# Patient Record
Sex: Female | Born: 1950
Health system: Southern US, Community
[De-identification: ages and names within clinical notes are randomized; demographics above are authoritative.]

## PROBLEM LIST (undated history)

## (undated) DIAGNOSIS — J189 Pneumonia, unspecified organism: Secondary | ICD-10-CM

## (undated) DIAGNOSIS — N186 End stage renal disease: Secondary | ICD-10-CM

## (undated) DIAGNOSIS — E785 Hyperlipidemia, unspecified: Secondary | ICD-10-CM

## (undated) DIAGNOSIS — Z992 Dependence on renal dialysis: Secondary | ICD-10-CM

## (undated) DIAGNOSIS — I998 Other disorder of circulatory system: Secondary | ICD-10-CM

## (undated) DIAGNOSIS — I2510 Atherosclerotic heart disease of native coronary artery without angina pectoris: Secondary | ICD-10-CM

## (undated) DIAGNOSIS — I5022 Chronic systolic (congestive) heart failure: Secondary | ICD-10-CM

## (undated) DIAGNOSIS — L97509 Non-pressure chronic ulcer of other part of unspecified foot with unspecified severity: Secondary | ICD-10-CM

## (undated) DIAGNOSIS — I351 Nonrheumatic aortic (valve) insufficiency: Secondary | ICD-10-CM

## (undated) DIAGNOSIS — I739 Peripheral vascular disease, unspecified: Secondary | ICD-10-CM

## (undated) DIAGNOSIS — D509 Iron deficiency anemia, unspecified: Secondary | ICD-10-CM

## (undated) DIAGNOSIS — I340 Nonrheumatic mitral (valve) insufficiency: Secondary | ICD-10-CM

## (undated) DIAGNOSIS — J309 Allergic rhinitis, unspecified: Secondary | ICD-10-CM

## (undated) DIAGNOSIS — I10 Essential (primary) hypertension: Secondary | ICD-10-CM

## (undated) DIAGNOSIS — N2581 Secondary hyperparathyroidism of renal origin: Secondary | ICD-10-CM

## (undated) DIAGNOSIS — E1165 Type 2 diabetes mellitus with hyperglycemia: Secondary | ICD-10-CM

## (undated) DIAGNOSIS — K209 Esophagitis, unspecified without bleeding: Secondary | ICD-10-CM

## (undated) DIAGNOSIS — M5412 Radiculopathy, cervical region: Secondary | ICD-10-CM

## (undated) DIAGNOSIS — I70229 Atherosclerosis of native arteries of extremities with rest pain, unspecified extremity: Secondary | ICD-10-CM

## (undated) DIAGNOSIS — I255 Ischemic cardiomyopathy: Secondary | ICD-10-CM

## (undated) HISTORY — PX: EYE SURGERY: SHX253

## (undated) HISTORY — DX: Hyperlipidemia, unspecified: E78.5

## (undated) HISTORY — DX: Radiculopathy, cervical region: M54.12

## (undated) HISTORY — PX: COLONOSCOPY: SHX174

## (undated) HISTORY — DX: Iron deficiency anemia, unspecified: D50.9

## (undated) HISTORY — PX: CATARACT EXTRACTION: SUR2

## (undated) HISTORY — PX: AV FISTULA PLACEMENT: SHX1204

## (undated) HISTORY — DX: Nonrheumatic aortic (valve) insufficiency: I35.1

## (undated) HISTORY — DX: Essential (primary) hypertension: I10

## (undated) HISTORY — DX: Secondary hyperparathyroidism of renal origin: N25.81

## (undated) HISTORY — DX: Type 2 diabetes mellitus with hyperglycemia: E11.65

## (undated) HISTORY — DX: Other disorder of circulatory system: I99.8

## (undated) HISTORY — DX: Non-pressure chronic ulcer of other part of unspecified foot with unspecified severity: L97.509

## (undated) HISTORY — DX: Allergic rhinitis, unspecified: J30.9

## (undated) HISTORY — DX: Ischemic cardiomyopathy: I25.5

## (undated) HISTORY — DX: Atherosclerosis of native arteries of extremities with rest pain, unspecified extremity: I70.229

---

## 1998-02-18 ENCOUNTER — Other Ambulatory Visit: Admission: RE | Admit: 1998-02-18 | Discharge: 1998-02-18 | Payer: Self-pay | Admitting: Family Medicine

## 1998-06-26 ENCOUNTER — Other Ambulatory Visit: Admission: RE | Admit: 1998-06-26 | Discharge: 1998-06-26 | Payer: Self-pay | Admitting: Obstetrics

## 1998-07-02 ENCOUNTER — Ambulatory Visit (HOSPITAL_COMMUNITY): Admission: RE | Admit: 1998-07-02 | Discharge: 1998-07-02 | Payer: Self-pay | Admitting: Obstetrics

## 1998-07-22 ENCOUNTER — Ambulatory Visit (HOSPITAL_COMMUNITY): Admission: RE | Admit: 1998-07-22 | Discharge: 1998-07-22 | Payer: Self-pay | Admitting: General Surgery

## 2000-05-25 ENCOUNTER — Encounter: Admission: RE | Admit: 2000-05-25 | Discharge: 2000-08-23 | Payer: Self-pay | Admitting: *Deleted

## 2001-10-13 ENCOUNTER — Ambulatory Visit (HOSPITAL_COMMUNITY): Admission: RE | Admit: 2001-10-13 | Discharge: 2001-10-13 | Payer: Self-pay | Admitting: Family Medicine

## 2004-12-04 ENCOUNTER — Ambulatory Visit: Payer: Self-pay | Admitting: Internal Medicine

## 2005-01-15 ENCOUNTER — Ambulatory Visit: Payer: Self-pay | Admitting: Internal Medicine

## 2005-10-14 ENCOUNTER — Ambulatory Visit: Payer: Self-pay | Admitting: Internal Medicine

## 2005-10-23 ENCOUNTER — Ambulatory Visit: Payer: Self-pay | Admitting: Internal Medicine

## 2005-12-01 ENCOUNTER — Ambulatory Visit: Payer: Self-pay | Admitting: Internal Medicine

## 2005-12-21 ENCOUNTER — Ambulatory Visit: Payer: Self-pay | Admitting: Internal Medicine

## 2005-12-23 ENCOUNTER — Encounter: Admission: RE | Admit: 2005-12-23 | Discharge: 2005-12-23 | Payer: Self-pay | Admitting: Obstetrics and Gynecology

## 2005-12-30 ENCOUNTER — Encounter: Payer: Self-pay | Admitting: Obstetrics

## 2005-12-30 ENCOUNTER — Encounter (HOSPITAL_BASED_OUTPATIENT_CLINIC_OR_DEPARTMENT_OTHER): Payer: Self-pay | Admitting: General Surgery

## 2006-01-04 ENCOUNTER — Encounter: Admission: RE | Admit: 2006-01-04 | Discharge: 2006-01-04 | Payer: Self-pay | Admitting: Obstetrics and Gynecology

## 2006-02-01 ENCOUNTER — Ambulatory Visit: Payer: Self-pay | Admitting: Internal Medicine

## 2006-02-10 ENCOUNTER — Emergency Department (HOSPITAL_COMMUNITY): Admission: EM | Admit: 2006-02-10 | Discharge: 2006-02-10 | Payer: Self-pay | Admitting: Emergency Medicine

## 2006-02-16 ENCOUNTER — Ambulatory Visit: Payer: Self-pay | Admitting: Internal Medicine

## 2006-03-15 ENCOUNTER — Ambulatory Visit: Payer: Self-pay | Admitting: Gastroenterology

## 2006-04-15 ENCOUNTER — Ambulatory Visit: Payer: Self-pay | Admitting: Gastroenterology

## 2006-04-15 LAB — HM COLONOSCOPY: HM Colonoscopy: NORMAL

## 2007-01-07 ENCOUNTER — Ambulatory Visit: Payer: Self-pay | Admitting: Internal Medicine

## 2007-01-07 LAB — CONVERTED CEMR LAB
ALT: 12 units/L (ref 0–40)
AST: 20 units/L (ref 0–37)
Albumin: 3.1 g/dL — ABNORMAL LOW (ref 3.5–5.2)
Alkaline Phosphatase: 57 units/L (ref 39–117)
BUN: 31 mg/dL — ABNORMAL HIGH (ref 6–23)
Basophils Absolute: 0.1 10*3/uL (ref 0.0–0.1)
Basophils Relative: 1 % (ref 0.0–1.0)
Bilirubin Urine: NEGATIVE
Bilirubin, Direct: 0.1 mg/dL (ref 0.0–0.3)
CO2: 25 meq/L (ref 19–32)
Calcium: 9.1 mg/dL (ref 8.4–10.5)
Chloride: 112 meq/L (ref 96–112)
Cholesterol: 175 mg/dL (ref 0–200)
Creatinine, Ser: 1.4 mg/dL — ABNORMAL HIGH (ref 0.4–1.2)
Creatinine,U: 76.9 mg/dL
Crystals: NEGATIVE
Eosinophils Absolute: 0.1 10*3/uL (ref 0.0–0.6)
Eosinophils Relative: 1.1 % (ref 0.0–5.0)
GFR calc Af Amer: 50 mL/min
GFR calc non Af Amer: 42 mL/min
Glucose, Bld: 86 mg/dL (ref 70–99)
HCT: 24.6 % — ABNORMAL LOW (ref 36.0–46.0)
HDL: 63.3 mg/dL (ref >39.0)
Hemoglobin: 8.6 g/dL — ABNORMAL LOW (ref 12.0–15.0)
Hgb A1c MFr Bld: 7 % — ABNORMAL HIGH (ref 4.6–6.0)
Ketones, ur: NEGATIVE mg/dL
LDL Cholesterol: 101 mg/dL — ABNORMAL HIGH (ref 0–99)
Leukocytes, UA: NEGATIVE
Lymphocytes Relative: 48.2 % — ABNORMAL HIGH (ref 12.0–46.0)
MCHC: 34.8 g/dL (ref 30.0–36.0)
MCV: 88.8 fL (ref 78.0–100.0)
Microalb Creat Ratio: 2059.8 mg/g — ABNORMAL HIGH (ref 0.0–30.0)
Microalb, Ur: 158.4 mg/dL (ref 0.0–1.9)
Monocytes Absolute: 0.5 10*3/uL (ref 0.2–0.7)
Monocytes Relative: 8.6 % (ref 3.0–11.0)
Mucus, UA: NEGATIVE
Neutro Abs: 2.2 10*3/uL (ref 1.4–7.7)
Neutrophils Relative %: 41.1 % — ABNORMAL LOW (ref 43.0–77.0)
Nitrite: NEGATIVE
Platelets: 273 10*3/uL (ref 150–400)
Potassium: 4.6 meq/L (ref 3.5–5.1)
RBC: 2.77 M/uL — ABNORMAL LOW (ref 3.87–5.11)
RDW: 12.8 % (ref 11.5–14.6)
Sodium: 140 meq/L (ref 135–145)
Specific Gravity, Urine: 1.025 (ref 1.000–1.03)
TSH: 2.06 microintl units/mL (ref 0.35–5.50)
Total Bilirubin: 0.5 mg/dL (ref 0.3–1.2)
Total CHOL/HDL Ratio: 2.8
Total Protein, Urine: >=300 mg/dL — AB
Total Protein: 6.3 g/dL (ref 6.0–8.3)
Triglycerides: 53 mg/dL (ref 0–149)
Urine Glucose: NEGATIVE mg/dL
Urobilinogen, UA: 0.2 (ref 0.0–1.0)
VLDL: 11 mg/dL (ref 0–40)
WBC: 5.3 10*3/uL (ref 4.5–10.5)
pH: 6 (ref 5.0–8.0)

## 2007-03-30 DIAGNOSIS — E785 Hyperlipidemia, unspecified: Secondary | ICD-10-CM | POA: Insufficient documentation

## 2007-03-30 HISTORY — DX: Hyperlipidemia, unspecified: E78.5

## 2008-01-25 ENCOUNTER — Ambulatory Visit: Payer: Self-pay | Admitting: Internal Medicine

## 2008-01-25 DIAGNOSIS — I10 Essential (primary) hypertension: Secondary | ICD-10-CM | POA: Insufficient documentation

## 2008-01-25 DIAGNOSIS — J019 Acute sinusitis, unspecified: Secondary | ICD-10-CM | POA: Insufficient documentation

## 2008-01-25 DIAGNOSIS — M5412 Radiculopathy, cervical region: Secondary | ICD-10-CM | POA: Insufficient documentation

## 2008-01-25 DIAGNOSIS — D509 Iron deficiency anemia, unspecified: Secondary | ICD-10-CM | POA: Insufficient documentation

## 2008-01-25 HISTORY — DX: Radiculopathy, cervical region: M54.12

## 2008-01-25 HISTORY — DX: Essential (primary) hypertension: I10

## 2008-01-25 HISTORY — DX: Iron deficiency anemia, unspecified: D50.9

## 2008-01-30 ENCOUNTER — Telehealth: Payer: Self-pay | Admitting: Internal Medicine

## 2008-02-01 ENCOUNTER — Encounter: Payer: Self-pay | Admitting: Internal Medicine

## 2008-02-01 DIAGNOSIS — N259 Disorder resulting from impaired renal tubular function, unspecified: Secondary | ICD-10-CM | POA: Insufficient documentation

## 2008-02-01 LAB — CONVERTED CEMR LAB
ALT: 10 units/L (ref 0–35)
AST: 18 units/L (ref 0–37)
Albumin: 3.5 g/dL (ref 3.5–5.2)
Alkaline Phosphatase: 60 units/L (ref 39–117)
BUN: 39 mg/dL — ABNORMAL HIGH (ref 6–23)
Basophils Absolute: 0.1 10*3/uL (ref 0.0–0.1)
Basophils Relative: 1.1 % — ABNORMAL HIGH (ref 0.0–1.0)
Bilirubin Urine: NEGATIVE
Bilirubin, Direct: 0.1 mg/dL (ref 0.0–0.3)
CO2: 22 meq/L (ref 19–32)
Calcium: 9.2 mg/dL (ref 8.4–10.5)
Chloride: 112 meq/L (ref 96–112)
Cholesterol: 192 mg/dL (ref 0–200)
Creatinine, Ser: 2.9 mg/dL — ABNORMAL HIGH (ref 0.4–1.2)
Creatinine,U: 53.1 mg/dL
Crystals: NEGATIVE
Eosinophils Absolute: 0 10*3/uL (ref 0.0–0.7)
Eosinophils Relative: 0.2 % (ref 0.0–5.0)
Folate: 17.3 ng/mL
GFR calc Af Amer: 22 mL/min
GFR calc non Af Amer: 18 mL/min
Glucose, Bld: 74 mg/dL (ref 70–99)
HCT: 28.8 % — ABNORMAL LOW (ref 36.0–46.0)
HDL: 47 mg/dL (ref >39.0)
Hemoglobin: 9.2 g/dL — ABNORMAL LOW (ref 12.0–15.0)
Hgb A1c MFr Bld: 7.5 % — ABNORMAL HIGH (ref 4.6–6.0)
Iron: 61 ug/dL (ref 42–145)
Ketones, ur: NEGATIVE mg/dL
LDL Cholesterol: 125 mg/dL — ABNORMAL HIGH (ref 0–99)
Lymphocytes Relative: 41.5 % (ref 12.0–46.0)
MCHC: 31.9 g/dL (ref 30.0–36.0)
MCV: 92.1 fL (ref 78.0–100.0)
Microalb Creat Ratio: 640.3 mg/g — ABNORMAL HIGH (ref 0.0–30.0)
Microalb, Ur: 34 mg/dL — ABNORMAL HIGH (ref 0.0–1.9)
Monocytes Absolute: 0.5 10*3/uL (ref 0.1–1.0)
Monocytes Relative: 7.8 % (ref 3.0–12.0)
Mucus, UA: NEGATIVE
Neutro Abs: 3.3 10*3/uL (ref 1.4–7.7)
Neutrophils Relative %: 49.4 % (ref 43.0–77.0)
Nitrite: NEGATIVE
Platelets: 305 10*3/uL (ref 150–400)
Potassium: 5.7 meq/L — ABNORMAL HIGH (ref 3.5–5.1)
RBC: 3.13 M/uL — ABNORMAL LOW (ref 3.87–5.11)
RDW: 12.9 % (ref 11.5–14.6)
Saturation Ratios: 10.7 % — ABNORMAL LOW (ref 20.0–50.0)
Sodium: 140 meq/L (ref 135–145)
Specific Gravity, Urine: <=1.005 (ref 1.000–1.03)
TSH: 1.81 microintl units/mL (ref 0.35–5.50)
Total Bilirubin: 0.5 mg/dL (ref 0.3–1.2)
Total CHOL/HDL Ratio: 4.1
Total Protein, Urine: 30 mg/dL — AB
Total Protein: 6.9 g/dL (ref 6.0–8.3)
Transferrin: 406.9 mg/dL — ABNORMAL HIGH (ref >=212.0)
Triglycerides: 102 mg/dL (ref 0–149)
Urine Glucose: NEGATIVE mg/dL
Urobilinogen, UA: 0.2 (ref 0.0–1.0)
VLDL: 20 mg/dL (ref 0–40)
WBC: 6.7 10*3/uL (ref 4.5–10.5)
pH: 6 (ref 5.0–8.0)

## 2008-02-03 ENCOUNTER — Encounter: Admission: RE | Admit: 2008-02-03 | Discharge: 2008-02-03 | Payer: Self-pay | Admitting: Internal Medicine

## 2008-02-17 ENCOUNTER — Telehealth: Payer: Self-pay | Admitting: Internal Medicine

## 2008-03-09 ENCOUNTER — Encounter: Payer: Self-pay | Admitting: Internal Medicine

## 2008-06-05 ENCOUNTER — Encounter: Payer: Self-pay | Admitting: Internal Medicine

## 2008-08-04 ENCOUNTER — Encounter: Payer: Self-pay | Admitting: Internal Medicine

## 2008-10-04 ENCOUNTER — Encounter: Payer: Self-pay | Admitting: Internal Medicine

## 2008-10-16 ENCOUNTER — Telehealth: Payer: Self-pay | Admitting: Internal Medicine

## 2008-11-02 ENCOUNTER — Encounter: Payer: Self-pay | Admitting: Internal Medicine

## 2008-11-02 ENCOUNTER — Ambulatory Visit: Payer: Self-pay | Admitting: Internal Medicine

## 2008-11-05 ENCOUNTER — Telehealth (INDEPENDENT_AMBULATORY_CARE_PROVIDER_SITE_OTHER): Payer: Self-pay | Admitting: *Deleted

## 2008-11-06 ENCOUNTER — Telehealth: Payer: Self-pay | Admitting: Internal Medicine

## 2008-11-09 ENCOUNTER — Telehealth: Payer: Self-pay | Admitting: Internal Medicine

## 2008-11-16 ENCOUNTER — Ambulatory Visit: Payer: Self-pay | Admitting: Internal Medicine

## 2008-11-20 ENCOUNTER — Encounter: Payer: Self-pay | Admitting: Internal Medicine

## 2009-01-07 ENCOUNTER — Encounter: Payer: Self-pay | Admitting: Internal Medicine

## 2009-01-24 ENCOUNTER — Encounter (INDEPENDENT_AMBULATORY_CARE_PROVIDER_SITE_OTHER): Payer: Self-pay | Admitting: *Deleted

## 2009-02-08 ENCOUNTER — Encounter (HOSPITAL_COMMUNITY): Admission: RE | Admit: 2009-02-08 | Discharge: 2009-05-02 | Payer: Self-pay | Admitting: Nephrology

## 2009-05-02 ENCOUNTER — Ambulatory Visit: Payer: Self-pay | Admitting: Internal Medicine

## 2009-05-02 DIAGNOSIS — M549 Dorsalgia, unspecified: Secondary | ICD-10-CM | POA: Insufficient documentation

## 2009-05-03 ENCOUNTER — Encounter: Payer: Self-pay | Admitting: Internal Medicine

## 2009-05-07 LAB — CONVERTED CEMR LAB
BUN: 76 mg/dL — ABNORMAL HIGH (ref 6–23)
Basophils Absolute: 0 10*3/uL (ref 0.0–0.1)
Basophils Relative: 0.7 % (ref 0.0–3.0)
Bilirubin Urine: NEGATIVE
CO2: 28 meq/L (ref 19–32)
Calcium: 8.5 mg/dL (ref 8.4–10.5)
Chloride: 96 meq/L (ref 96–112)
Cholesterol: 204 mg/dL — ABNORMAL HIGH (ref 0–200)
Creatinine, Ser: 2.8 mg/dL — ABNORMAL HIGH (ref 0.4–1.2)
Direct LDL: 141.1 mg/dL
Eosinophils Absolute: 0 10*3/uL (ref 0.0–0.7)
Eosinophils Relative: 0.5 % (ref 0.0–5.0)
GFR calc non Af Amer: 22.41 mL/min (ref >60)
Glucose, Bld: 192 mg/dL — ABNORMAL HIGH (ref 70–99)
HCT: 27.9 % — ABNORMAL LOW (ref 36.0–46.0)
HDL: 43.7 mg/dL (ref >39.00)
Hemoglobin: 9.3 g/dL — ABNORMAL LOW (ref 12.0–15.0)
Hgb A1c MFr Bld: 10.1 % — ABNORMAL HIGH (ref 4.6–6.5)
Iron: 40 ug/dL — ABNORMAL LOW (ref 42–145)
Ketones, ur: NEGATIVE mg/dL
Leukocytes, UA: NEGATIVE
Lymphocytes Relative: 35.1 % (ref 12.0–46.0)
Lymphs Abs: 2 10*3/uL (ref 0.7–4.0)
MCHC: 33.2 g/dL (ref 30.0–36.0)
MCV: 91.1 fL (ref 78.0–100.0)
Monocytes Absolute: 0.4 10*3/uL (ref 0.1–1.0)
Monocytes Relative: 6.1 % (ref 3.0–12.0)
Neutro Abs: 3.4 10*3/uL (ref 1.4–7.7)
Neutrophils Relative %: 57.6 % (ref 43.0–77.0)
Nitrite: NEGATIVE
Platelets: 287 10*3/uL (ref 150.0–400.0)
Potassium: 4.3 meq/L (ref 3.5–5.1)
RBC: 3.07 M/uL — ABNORMAL LOW (ref 3.87–5.11)
RDW: 12.8 % (ref 11.5–14.6)
Saturation Ratios: 13.3 % — ABNORMAL LOW (ref 20.0–50.0)
Sodium: 133 meq/L — ABNORMAL LOW (ref 135–145)
Specific Gravity, Urine: 1.01 (ref 1.000–1.030)
Total CHOL/HDL Ratio: 5
Total Protein, Urine: 30 mg/dL
Transferrin: 215 mg/dL (ref 212.0–360.0)
Triglycerides: 110 mg/dL (ref 0.0–149.0)
Urine Glucose: NEGATIVE mg/dL
Urobilinogen, UA: 0.2 (ref 0.0–1.0)
VLDL: 22 mg/dL (ref 0.0–40.0)
pH: 5.5 (ref 5.0–8.0)

## 2009-05-08 ENCOUNTER — Encounter: Payer: Self-pay | Admitting: Internal Medicine

## 2009-05-21 ENCOUNTER — Ambulatory Visit: Payer: Self-pay | Admitting: Endocrinology

## 2009-05-21 DIAGNOSIS — R209 Unspecified disturbances of skin sensation: Secondary | ICD-10-CM | POA: Insufficient documentation

## 2009-05-21 DIAGNOSIS — N2581 Secondary hyperparathyroidism of renal origin: Secondary | ICD-10-CM

## 2009-05-21 DIAGNOSIS — IMO0001 Reserved for inherently not codable concepts without codable children: Secondary | ICD-10-CM

## 2009-05-21 DIAGNOSIS — E119 Type 2 diabetes mellitus without complications: Secondary | ICD-10-CM | POA: Insufficient documentation

## 2009-05-21 HISTORY — DX: Secondary hyperparathyroidism of renal origin: N25.81

## 2009-05-21 HISTORY — DX: Reserved for inherently not codable concepts without codable children: IMO0001

## 2009-05-21 LAB — CONVERTED CEMR LAB
Calcium, Total (PTH): 9.4 mg/dL (ref 8.4–10.5)
Folate: 9.5 ng/mL
PTH: 385.4 pg/mL — ABNORMAL HIGH (ref 14.0–72.0)
Vitamin B-12: 308 pg/mL (ref 211–911)

## 2009-05-23 ENCOUNTER — Encounter (INDEPENDENT_AMBULATORY_CARE_PROVIDER_SITE_OTHER): Payer: Self-pay | Admitting: *Deleted

## 2009-07-08 ENCOUNTER — Ambulatory Visit: Payer: Self-pay | Admitting: Gastroenterology

## 2009-07-17 ENCOUNTER — Telehealth (INDEPENDENT_AMBULATORY_CARE_PROVIDER_SITE_OTHER): Payer: Self-pay | Admitting: *Deleted

## 2009-08-29 ENCOUNTER — Ambulatory Visit: Payer: Self-pay | Admitting: Internal Medicine

## 2009-08-29 LAB — CONVERTED CEMR LAB
ALT: 14 units/L (ref 0–35)
AST: 16 units/L (ref 0–37)
Albumin: 3.6 g/dL (ref 3.5–5.2)
Alkaline Phosphatase: 111 units/L (ref 39–117)
BUN: 74 mg/dL — ABNORMAL HIGH (ref 6–23)
Basophils Absolute: 0.1 10*3/uL (ref 0.0–0.1)
Basophils Relative: 1.5 % (ref 0.0–3.0)
Bilirubin Urine: NEGATIVE
Bilirubin, Direct: 0.1 mg/dL (ref 0.0–0.3)
CO2: 26 meq/L (ref 19–32)
Calcium: 9.4 mg/dL (ref 8.4–10.5)
Chloride: 101 meq/L (ref 96–112)
Cholesterol: 276 mg/dL — ABNORMAL HIGH (ref 0–200)
Creatinine, Ser: 2.6 mg/dL — ABNORMAL HIGH (ref 0.4–1.2)
Creatinine,U: 61.7 mg/dL
Direct LDL: 164.8 mg/dL
Eosinophils Absolute: 0.1 10*3/uL (ref 0.0–0.7)
Eosinophils Relative: 1.1 % (ref 0.0–5.0)
GFR calc non Af Amer: 24.38 mL/min (ref >60)
Glucose, Bld: 345 mg/dL — ABNORMAL HIGH (ref 70–99)
HCT: 30.3 % — ABNORMAL LOW (ref 36.0–46.0)
HDL: 42.1 mg/dL (ref >39.00)
Hemoglobin: 10.2 g/dL — ABNORMAL LOW (ref 12.0–15.0)
Hgb A1c MFr Bld: 14.2 % — ABNORMAL HIGH (ref 4.6–6.5)
Ketones, ur: NEGATIVE mg/dL
Leukocytes, UA: NEGATIVE
Lymphocytes Relative: 41.5 % (ref 12.0–46.0)
Lymphs Abs: 2.2 10*3/uL (ref 0.7–4.0)
MCHC: 33.7 g/dL (ref 30.0–36.0)
MCV: 92.7 fL (ref 78.0–100.0)
Microalb Creat Ratio: 230.1 mg/g — ABNORMAL HIGH (ref 0.0–30.0)
Microalb, Ur: 14.2 mg/dL — ABNORMAL HIGH (ref 0.0–1.9)
Monocytes Absolute: 0.4 10*3/uL (ref 0.1–1.0)
Monocytes Relative: 8.2 % (ref 3.0–12.0)
Neutro Abs: 2.5 10*3/uL (ref 1.4–7.7)
Neutrophils Relative %: 47.7 % (ref 43.0–77.0)
Nitrite: NEGATIVE
Platelets: 266 10*3/uL (ref 150.0–400.0)
Potassium: 4.4 meq/L (ref 3.5–5.1)
RBC: 3.27 M/uL — ABNORMAL LOW (ref 3.87–5.11)
RDW: 12.3 % (ref 11.5–14.6)
Sodium: 136 meq/L (ref 135–145)
Specific Gravity, Urine: 1.02 (ref 1.000–1.030)
TSH: 2.15 microintl units/mL (ref 0.35–5.50)
Total Bilirubin: 0.6 mg/dL (ref 0.3–1.2)
Total CHOL/HDL Ratio: 7
Total Protein: 7.6 g/dL (ref 6.0–8.3)
Triglycerides: 271 mg/dL — ABNORMAL HIGH (ref 0.0–149.0)
Urine Glucose: 250 mg/dL
Urobilinogen, UA: 0.2 (ref 0.0–1.0)
VLDL: 54.2 mg/dL — ABNORMAL HIGH (ref 0.0–40.0)
WBC: 5.3 10*3/uL (ref 4.5–10.5)
pH: 5.5 (ref 5.0–8.0)

## 2009-09-05 ENCOUNTER — Ambulatory Visit: Payer: Self-pay | Admitting: Internal Medicine

## 2009-09-23 ENCOUNTER — Ambulatory Visit: Payer: Self-pay | Admitting: Internal Medicine

## 2009-10-29 ENCOUNTER — Ambulatory Visit: Payer: Self-pay | Admitting: Internal Medicine

## 2009-10-29 LAB — CONVERTED CEMR LAB
BUN: 53 mg/dL — ABNORMAL HIGH (ref 6–23)
CO2: 27 meq/L (ref 19–32)
Calcium: 8.6 mg/dL (ref 8.4–10.5)
Chloride: 104 meq/L (ref 96–112)
Cholesterol: 150 mg/dL (ref 0–200)
Creatinine, Ser: 2.2 mg/dL — ABNORMAL HIGH (ref 0.4–1.2)
GFR calc non Af Amer: 29.54 mL/min (ref >60)
Glucose, Bld: 116 mg/dL — ABNORMAL HIGH (ref 70–99)
HDL: 49.3 mg/dL (ref >39.00)
Hgb A1c MFr Bld: 10.8 % — ABNORMAL HIGH (ref 4.6–6.5)
LDL Cholesterol: 85 mg/dL (ref 0–99)
Potassium: 4.1 meq/L (ref 3.5–5.1)
Sodium: 138 meq/L (ref 135–145)
Total CHOL/HDL Ratio: 3
Triglycerides: 78 mg/dL (ref 0.0–149.0)
VLDL: 15.6 mg/dL (ref 0.0–40.0)

## 2009-11-04 ENCOUNTER — Ambulatory Visit: Payer: Self-pay | Admitting: Internal Medicine

## 2009-12-10 ENCOUNTER — Ambulatory Visit: Payer: Self-pay | Admitting: Internal Medicine

## 2009-12-10 LAB — CONVERTED CEMR LAB
BUN: 66 mg/dL — ABNORMAL HIGH (ref 6–23)
CO2: 28 meq/L (ref 19–32)
Calcium: 8.8 mg/dL (ref 8.4–10.5)
Chloride: 101 meq/L (ref 96–112)
Cholesterol: 154 mg/dL (ref 0–200)
Creatinine, Ser: 3.1 mg/dL — ABNORMAL HIGH (ref 0.4–1.2)
GFR calc non Af Amer: 19.88 mL/min (ref >60)
Glucose, Bld: 86 mg/dL (ref 70–99)
HDL: 44.9 mg/dL (ref >39.00)
Hgb A1c MFr Bld: 9.3 % — ABNORMAL HIGH (ref 4.6–6.5)
LDL Cholesterol: 92 mg/dL (ref 0–99)
Potassium: 4.1 meq/L (ref 3.5–5.1)
Sodium: 138 meq/L (ref 135–145)
Total CHOL/HDL Ratio: 3
Triglycerides: 86 mg/dL (ref 0.0–149.0)
VLDL: 17.2 mg/dL (ref 0.0–40.0)

## 2009-12-16 ENCOUNTER — Ambulatory Visit: Payer: Self-pay | Admitting: Internal Medicine

## 2009-12-18 ENCOUNTER — Encounter: Payer: Self-pay | Admitting: Internal Medicine

## 2010-02-18 ENCOUNTER — Encounter: Payer: Self-pay | Admitting: Internal Medicine

## 2010-02-20 ENCOUNTER — Encounter: Payer: Self-pay | Admitting: Internal Medicine

## 2010-03-07 ENCOUNTER — Encounter (HOSPITAL_COMMUNITY): Admission: RE | Admit: 2010-03-07 | Discharge: 2010-04-04 | Payer: Self-pay | Admitting: Nephrology

## 2010-03-20 ENCOUNTER — Ambulatory Visit: Payer: Self-pay | Admitting: Vascular Surgery

## 2010-03-28 ENCOUNTER — Ambulatory Visit (HOSPITAL_COMMUNITY): Admission: RE | Admit: 2010-03-28 | Discharge: 2010-03-28 | Payer: Self-pay | Admitting: Vascular Surgery

## 2010-03-28 ENCOUNTER — Ambulatory Visit: Payer: Self-pay | Admitting: Vascular Surgery

## 2010-05-21 ENCOUNTER — Ambulatory Visit: Payer: Self-pay | Admitting: Vascular Surgery

## 2010-07-31 ENCOUNTER — Emergency Department (HOSPITAL_COMMUNITY)
Admission: EM | Admit: 2010-07-31 | Discharge: 2010-07-31 | Payer: Self-pay | Source: Home / Self Care | Admitting: Emergency Medicine

## 2010-09-21 ENCOUNTER — Encounter: Payer: Self-pay | Admitting: Internal Medicine

## 2010-09-21 ENCOUNTER — Encounter: Payer: Self-pay | Admitting: Obstetrics and Gynecology

## 2010-10-02 NOTE — Assessment & Plan Note (Signed)
Summary: BACK PAIN/NWS   Vital Signs:  Patient profile:   60 year old female Height:      60 inches Weight:      160.50 pounds BMI:     31.46 O2 Sat:      93 % Temp:     98.5 degrees F oral Pulse rate:   81 / minute BP sitting:   130 / 62  (left arm) Cuff size:   regular  Vitals Entered By: Hector Brunswick (May 02, 2009 3:23 PM) CC: lower back pain x 1 week   Problems Prior to Update: 1)  Back Pain  (ICD-724.5) 2)  Renal Insufficiency  (ICD-588.9) 3)  Hypertension  (ICD-401.9) 4)  Anemia-iron Deficiency  (ICD-280.9) 5)  Sinusitis- Acute-nos  (ICD-461.9) 6)  Cervical Radiculopathy, Left  (ICD-723.4) 7)  Diabetes Mellitus, Type II  (ICD-250.00) 8)  Preventive Health Care  (ICD-V70.0) 9)  Hyperlipidemia  (ICD-272.4)  Medications Prior to Update: 1)  Simvastatin 40 Mg  Tabs (Simvastatin) .[name].. 1po Once Daily 2)  Carvedilol 12.5 Mg Tabs (Carvedilol) .[name].. 1 Tab Once Daily 3)  Furosemide 40 Mg Tabs (Furosemide) .[name].. 1 Tab Once Daily 4)  Glimepiride 4 Mg Tabs (Glimepiride) .... 2 Tabs Once Daily 5)  Lantus Solostar 100 Unit/ml Soln (Insulin Glargine) .... Use Asd 18 Units Per Day 6)  Lantus Solostar Needle .... Use Asd 1 Once Daily 7)  Onetouch Ultra Test  Strp (Glucose Blood) .... Use Asd Two Times A Day 8)  Onetouch Lancets  Misc (Lancets) .... Use Asd Two Times A Day 9)  Adult Aspirin Ec Low Strength 81 Mg Tbec (Aspirin) .[name].. 1 By Mouth Once Daily 10)  Januvia 50 Mg Tabs (Sitagliptin Phosphate) .[name].. 1po Once Daily  Current Medications (verified): 1)  Simvastatin 40 Mg  Tabs (Simvastatin) .[name].. 1po Once Daily 2)  Carvedilol 12.5 Mg Tabs (Carvedilol) .[name].. 1 Tab Once Daily 3)  Furosemide 40 Mg Tabs (Furosemide) .[name].. 1 Tab Once Daily 4)  Glimepiride 4 Mg Tabs (Glimepiride) .... 2 Tabs Once Daily 5)  Lantus Solostar 100 Unit/ml Soln (Insulin Glargine) .... Use Asd 18 Units Per Day 6)  Lantus Solostar Needle .... Use Asd 1 Once Daily 7)  Onetouch Ultra Test  Strp (Glucose  Blood) .... Use Asd Two Times A Day 8)  Onetouch Lancets  Misc (Lancets) .... Use Asd Two Times A Day 9)  Adult Aspirin Ec Low Strength 81 Mg Tbec (Aspirin) .[name].. 1 By Mouth Once Daily 10)  Januvia 50 Mg Tabs (Sitagliptin Phosphate) .[name].. 1po Once Daily 11)  Flexeril 5 Mg Tabs (Cyclobenzaprine Hcl) .[name].. 1 By Mouth Three Times A Day As Needed  Allergies (verified): No Known Drug Allergies  CC:  lower back pain x 1 week.  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  Overall improved in the last 3 days now mild and on the right side only; worse to get up from chair or bend at the waist   Past History:  Past Medical History: Last updated: 01/25/2008 Hyperlipidemia allergies Diabetes mellitus, type II Anemia-iron deficiency migraine Hypertension  Past Surgical History: Last updated: 01/25/2008 Denies surgical history  Social  History: Last updated: 01/25/2008 Never Smoked Alcohol use-no Single no children [employer] cafeteria worker  Risk Factors: Smoking Status: never (01/25/2008)  Review of Systems       all otherwise negative - 12 system review done   Physical Exam  General:  alert and overweight-appearing.   Head:  normocephalic and atraumatic.   Eyes:  vision grossly intact, pupils equal, and pupils round.   Ears:  R ear normal and L ear normal.   Nose:  no external deformity and no nasal discharge.   Mouth:  no gingival abnormalities and pharynx pink and moist.   Neck:  supple and no masses.   Lungs:  normal respiratory effort and normal breath sounds.   Heart:  normal rate and regular rhythm.   Abdomen:  soft, non-tender, and normal  bowel sounds.   Msk:  mild right lumbar paravertebral tender only Neurologic:  strength normal in all extremities and sensation intact to light touch.     Impression & Recommendations:  Problem # 1:  BACK PAIN (ICD-724.5)  Her updated medication list for this problem includes:    Adult Aspirin Ec Low Strength 81 Mg Tbec (Aspirin) .[name]... 1 by mouth once daily    Flexeril 5 Mg Tabs (Cyclobenzaprine hcl) .[name]... 1 by mouth three times a day as needed c/w strain most likely, for tylenol and flexeril as needed;  check UA to r/o GU problem  Orders: TLB-Udip w/ Micro (81001-URINE) T-Culture, Urine [id number])  Problem # 2:  RENAL INSUFFICIENCY (ICD-588.9)  has not been able to see renal since may 2010 due to finances - to re-check labs today  Orders: TLB-BMP (Basic Metabolic Panel-BMET) (99991111)  Problem # 3:  DIABETES MELLITUS, TYPE II (ICD-250.00)  Her updated medication list for this problem includes:    Glimepiride 4 Mg Tabs (Glimepiride) .[name]... 2 tabs once daily    Lantus Solostar 100 Unit/ml Soln (Insulin glargine) ..... Use asd 18 units per day    Adult Aspirin Ec Low Strength 81 Mg Tbec (Aspirin) .[name]... 1 by mouth once daily    Januvia 50 Mg Tabs (Sitagliptin phosphate) .[name]... 1po once daily  Orders: TLB-Lipid Panel (80061-LIPID) TLB-A1C / Hgb A1C (Glycohemoglobin) (83036-A1C) stable overall by hx and exam, ok to continue meds/tx as is ; Pt to cont DM diet, excercise, wt loss efforts; to check labs today   Problem # 4:  ANEMIA-IRON DEFICIENCY (ICD-280.9) asympt - to re-check Orders: TLB-CBC Platelet - w/Differential (85025-CBCD) TLB-IBC Pnl (Iron/FE;Transferrin) (83550-IBC)  Complete Medication List: 1)  Simvastatin 40 Mg Tabs (Simvastatin) .[name].. 1po once daily 2)  Carvedilol 12.5 Mg Tabs (Carvedilol) .[name].. 1 tab once daily 3)  Furosemide 40 Mg Tabs (Furosemide) .[name].. 1 tab once daily 4)  Glimepiride 4 Mg Tabs (Glimepiride) .... 2 tabs once daily 5)  Lantus Solostar  100 Unit/ml Soln (Insulin glargine) .... Use asd 18 units per day 6)  Lantus Solostar Needle  .... Use asd 1 once daily 7)  Onetouch Ultra Test Strp (Glucose blood) .... Use asd two times a day 8)  Onetouch Lancets Misc (Lancets) .... Use asd two times a day 9)  Adult Aspirin Ec Low Strength 81 Mg Tbec (Aspirin) .[name].. 1 by mouth once daily 10)  Januvia 50 Mg Tabs (Sitagliptin phosphate) .[name].. 1po once daily 11)  Flexeril 5 Mg Tabs (Cyclobenzaprine hcl) .[name].. 1 by mouth three times a day as needed   Patient Instructions: 1)  Please take all new medications as prescribed  2)  you can also  take the tylenol as needed for the pain 3)  Please go to the Lab in the basement for your blood and/or urine tests today 4)  please call the kidney doctors for your next appointment 5)  Please schedule a follow-up appointment in 4 months with CPX labs 6)  and: 7)  HbgA1C prior to visit, ICD-9: 250.02 8)  Urine Microalbumin prior to visit, ICD-9: Prescriptions: FLEXERIL 5 MG TABS (CYCLOBENZAPRINE HCL) 1 by mouth three times a day as needed  #60 x 1   Entered and Authorized by:   Biagio Borg MD   Signed by:   Biagio Borg MD on 05/02/2009   Method used:   Print then Give to Patient   RxID:   [id number]

## 2010-10-02 NOTE — Miscellaneous (Signed)
Summary: Orders Update   Clinical Lists Changes  Orders: Added new Referral order of Nephrology Referral (Nephro) - Signed

## 2010-10-02 NOTE — Letter (Signed)
Summary: [facility]   Imported By: Phillis Knack 01/03/2010 14:05:48  _____________________________________________________________________  External Attachment:    Type:   Image     Comment:   External Document

## 2010-10-02 NOTE — Assessment & Plan Note (Signed)
Summary: BLOOD SUGAR ELEVATED/KDC   Vital Signs:  Patient Profile:   60 Years Old Female Weight:      153.38 pounds Temp:     98.0 degrees F oral Pulse rate:   70 / minute BP sitting:   130 / 70  (right arm) Cuff size:   regular  Vitals Entered By: Hector Brunswick (November 02, 2008 3:07 PM)                 Chief Complaint:  cbg.  History of Present Illness: here with family - unfortunately had to be taken off her actosplus met due to renal fxn worsening per nephrologist, also carvedilol and lasix added in place o the ace/hct, other meds the same but now with increaseing fatigue and marked polydipsia and polyuria on top of taking the lasix; does not check cbg's at home often at all; some mild wt loss she thinks, but no dizziness, orthostasis or syncope or fall; has some ongoing mild sob but no change and certainly no st, fever, cough, CP, wheezing, orthopnea, pnd, or worsening LE edema    Updated Prior Medication List: SIMVASTATIN 40 MG  TABS (SIMVASTATIN) 1po once daily CARVEDILOL 12.5 MG TABS (CARVEDILOL) 1 tab once daily FUROSEMIDE 40 MG TABS (FUROSEMIDE) 1 tab once daily GLIMEPIRIDE 4 MG TABS (GLIMEPIRIDE) 2 tabs once daily LANTUS SOLOSTAR 100 UNIT/ML SOLN (INSULIN GLARGINE) use asd 10 units per day * LANTUS SOLOSTAR NEEDLE use asd 1 once daily ONETOUCH ULTRA TEST  STRP (GLUCOSE BLOOD) use asd two times a day ONETOUCH LANCETS  MISC (LANCETS) use asd two times a day ADULT ASPIRIN EC LOW STRENGTH 81 MG TBEC (ASPIRIN) 1 by mouth once daily  Current Allergies (reviewed today): No known allergies   Past Medical History:    Reviewed history from 01/25/2008 and no changes required:       Hyperlipidemia       allergies       Diabetes mellitus, type II       Anemia-iron deficiency       migraine       Hypertension  Past Surgical History:    Reviewed history from 01/25/2008 and no changes required:       Denies surgical history   Social History:    Reviewed history from  01/25/2008 and no changes required:       Never Smoked       Alcohol use-no       Single       no children       [employer]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 No apparent alopecia or balding. Eyes:     No corneal or conjunctival inflammation noted. EOMI. Perrla. Ears:     R ear normal and L ear normal.   Nose:     no external deformity and no nasal discharge.   Mouth:     good dentition and no gingival abnormalities.   Neck:     supple and no masses.   Lungs:     normal respiratory effort and normal breath sounds.   Heart:     normal rate and regular rhythm.   Msk:     no joint tenderness and no joint swelling.   Extremities:  no edema, no ulcers     Impression & Recommendations:  Problem # 1:  DIABETES MELLITUS, TYPE II (ICD-250.00)  The following medications were removed from the medication list:    Glipizide Xl 5 Mg Tb24 (Glipizide) .[name]... 1 by mouth once daily    Lisinopril-hydrochlorothiazide 20-12.5 Mg Tabs (Lisinopril-hydrochlorothiazide) .[name]... 1 by mouth once daily    Actoplus Met 15-500 Mg Tabs (Pioglitazone hcl-metformin hcl) .[name]... 1 by mouth once daily    Adult Aspirin Ec Low Strength 81 Mg Tbec (Aspirin) .[name]... 1po once daily  Her updated medication list for this problem includes:    Glimepiride 4 Mg Tabs (Glimepiride) .[name]... 2 tabs once daily    Lantus Solostar 100 Unit/ml Soln (Insulin glargine) ..... Use asd 10 units per day    Adult Aspirin Ec Low Strength 81 Mg Tbec (Aspirin) .[name]... 1 by mouth once daily severe uncontrolled with a1c 11.4 by recent labs per nephrology  to start lantus 10 units per day, check cbg's two times a day, call with sugars on Mon mar 8, rov 2 wks Orders: Insulin 5 units [id number]) Admin of Therapeutic Inj  intramuscular or subcutaneous  [id number])  Labs Reviewed: HgBA1c: 7.5 (01/26/2008)   Creat: 2.9 (01/26/2008)   Microalbumin: 34.0 (01/26/2008)   Problem # 2:  HYPERTENSION (ICD-401.9)  The following medications were removed from the medication list:    Lisinopril-hydrochlorothiazide 20-12.5 Mg Tabs (Lisinopril-hydrochlorothiazide) .[name]... 1 by mouth once daily  Her updated medication list for this problem includes:    Carvedilol 12.5 Mg Tabs (Carvedilol) .[name]... 1 tab once daily    Furosemide 40 Mg Tabs (Furosemide) .[name]... 1 tab once daily  BP today: 130/70 Prior BP: 108/62 (01/25/2008)  Labs Reviewed: Creat: 2.9 (01/26/2008) Chol: 192 (01/26/2008)   HDL: 47.0 (01/26/2008)   LDL: 125 (01/26/2008)   TG: 102 (01/26/2008) stable overall by hx and exam, ok to continue meds/tx as is   Problem # 3:  HYPERLIPIDEMIA (ICD-272.4)  Her updated medication list for this problem includes:    Simvastatin 40 Mg Tabs (Simvastatin) .[name]... 1po once daily  Labs Reviewed: Chol: 192 (01/26/2008)   HDL: 47.0 (01/26/2008)   LDL: 125 (01/26/2008)   TG: 102 (01/26/2008) SGOT: 18 (01/26/2008)   SGPT: 10 (01/26/2008) improved by recent labs - to cont med and diet  Problem # 4:  RENAL INSUFFICIENCY (ICD-588.9) with recent cr 2.1 gradually worsening recently, likely chronic now exac by above -   Complete Medication List: 1)  Simvastatin 40 Mg Tabs (Simvastatin) .[name].. 1po once daily 2)  Carvedilol 12.5 Mg Tabs (Carvedilol) .[name].. 1 tab once daily 3)  Furosemide 40 Mg Tabs (Furosemide) .[name].. 1 tab once daily 4)  Glimepiride 4 Mg Tabs (Glimepiride) .... 2 tabs once daily 5)  Lantus Solostar 100 Unit/ml Soln (Insulin glargine) .... Use asd 10 units per day 6)  Lantus Solostar Needle  .... Use asd 1 once daily 7)  Onetouch Ultra Test Strp (Glucose blood) .... Use asd two times a day 8)  Onetouch Lancets Misc (Lancets) .... Use asd two times a day 9)  Adult Aspirin Ec Low Strength 81 Mg Tbec (Aspirin) .[name].. 1 by mouth once daily   Patient  Instructions: 1)  start the lantus at 10 units per day 2)  please call with your blood sugar results (after you take it twice per day) on Mon mar 8 (call [phone]) 3)  you are given the new glucometer and strips, and prescriptions today 4)  Continue all medications that you may  have been taking previously  5)  please keep your followup appt with the kidney doctors as you do 6)  Please schedule a follow-up appointment in 2 weeks. 7)  Take an Aspirin every day - 81 mg - 1 per day - COATED only   Prescriptions: ONETOUCH LANCETS  MISC (LANCETS) use asd two times a day  #200 x 11   Entered and Authorized by:   Biagio Borg MD   Signed by:   Biagio Borg MD on 11/02/2008   Method used:   Print then Give to Patient   RxID:   [id number] [place] TEST  STRP (GLUCOSE BLOOD) use asd two times a day  #200 x 11   Entered and Authorized by:   Biagio Borg MD   Signed by:   Biagio Borg MD on 11/02/2008   Method used:   Print then Give to Patient   RxID:   [id number] [place] use asd 1 once daily  #100 x 11   Entered and Authorized by:   Biagio Borg MD   Signed by:   Biagio Borg MD on 11/02/2008   Method used:   Print then Give to Patient   RxID:   [id number] LANTUS SOLOSTAR 100 UNIT/ML SOLN (INSULIN GLARGINE) use asd 10 units per day  #5pen x 11   Entered and Authorized by:   Biagio Borg MD   Signed by:   Biagio Borg MD on 11/02/2008   Method used:   Print then Give to Patient   RxID:   [id number]    Medication Administration  Injection # 1:    Medication: Insulin 5 units    Diagnosis: DIABETES MELLITUS, TYPE II (ICD-250.00)    Route: IM    Site: R deltoid    Exp Date: 03/02/2011    Lot #: [id number]    Mfr: [place]    Patient tolerated injection without complications    Given by: Hector Brunswick (November 02, 2008 3:54 PM)  Orders Added: 1)  Insulin 5 units [[id number]] 2)  Admin of Therapeutic Inj  intramuscular or subcutaneous [[id number]] 3)   Est. Patient Level IV [id number]

## 2010-10-02 NOTE — Assessment & Plan Note (Signed)
Summary: COLD SYMPTOMS--SUGAR KEEP DROPPING:67 THIS AM--STC   Vital Signs:  Patient profile:   60 year old female Height:      60 inches Weight:      149 pounds BMI:     29.20 O2 Sat:      98 % on Room air Temp:     97.5 degrees F oral Pulse rate:   71 / minute BP sitting:   130 / 72  (left arm) Cuff size:   regular  Vitals Entered ByShirlean Mylar Ewing (September 23, 2009 3:00 PM)  O2 Flow:  Room air CC: BS dropping, congestion/RE   CC:  BS dropping and congestion/RE.  History of Present Illness: here after recent URI symtpoms last wk with nasal congestion and scratchy throat and malaise now resolved;  she does not think her by mouth intake was much reduced but has had several low AM sugars in the past wk on the current lantus and glimeparide. CBG this am 67 and felt weak.  Overall good complaince with meds, tolerating meds well.  Pt denies CP, sob, doe, wheezing, orthopnea, pnd, worsening LE edema, palps, dizziness or syncope   Pt denies new neuro symptoms such as headache, facial or extremity weakness   Problems Prior to Update: 1)  Viral Infection-unspec  (ICD-079.99) 2)  Secondary Hyperparathyroidism  (ICD-588.81) 3)  Numbness  (ICD-782.0) 4)  Diabetes Mellitus, Type I, With Ophthalmic Complications  (123XX123) 5)  Back Pain  (ICD-724.5) 6)  Renal Insufficiency  (ICD-588.9) 7)  Hypertension  (ICD-401.9) 8)  Anemia-iron Deficiency  (ICD-280.9) 9)  Sinusitis- Acute-nos  (ICD-461.9) 10)  Cervical Radiculopathy, Left  (ICD-723.4) 11)  Preventive Health Care  (ICD-V70.0) 12)  Hyperlipidemia  (ICD-272.4)  Medications Prior to Update: 1)  Simvastatin 40 Mg  Tabs (Simvastatin) .[name].. 1po Once Daily 2)  Carvedilol 12.5 Mg Tabs (Carvedilol) .[name].. 1 Tab Two Times A Day 3)  Furosemide 40 Mg Tabs (Furosemide) .[name].. 1 Tab Two Times A Day 4)  Lantus Solostar 100 Unit/ml Soln (Insulin Glargine) .... Take 40 Subcutaneously Once Daily 5)  Lantus Solostar Needle .... Use Asd 1 Once Daily 6)   Onetouch Ultra Test  Strp (Glucose Blood) .... Use Asd Two Times A Day 7)  Onetouch Lancets  Misc (Lancets) .... Use Asd Two Times A Day 8)  Adult Aspirin Ec Low Strength 81 Mg Tbec (Aspirin) .[name].. 1 By Mouth Once Daily 9)  Glimepiride 4 Mg Tabs (Glimepiride) .[name].. 1 By Mouth Two Times A Day  Current Medications (verified): 1)  Simvastatin 40 Mg  Tabs (Simvastatin) .[name].. 1po Once Daily 2)  Carvedilol 12.5 Mg Tabs (Carvedilol) .[name].. 1 Tab Two Times A Day 3)  Furosemide 40 Mg Tabs (Furosemide) .[name].. 1 Tab Two Times A Day 4)  Lantus Solostar 100 Unit/ml Soln (Insulin Glargine) .... Take 40 Subcutaneously Once Daily 5)  Lantus Solostar Needle .... Use Asd 1 Once Daily 6)  Onetouch Ultra Test  Strp (Glucose Blood) .... Use Asd Two Times A Day 7)  Onetouch Lancets  Misc (Lancets) .... Use Asd Two Times A Day 8)  Adult Aspirin Ec Low Strength 81 Mg Tbec (Aspirin) .[name].. 1 By Mouth Once Daily 9)  Glimepiride 4 Mg Tabs (Glimepiride) .[name].. 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   Head:  normocephalic and atraumatic.   Eyes:  vision grossly intact, pupils equal, and pupils round.   Ears:  R ear normal and L ear normal.   Nose:  no external deformity and no nasal discharge.   Mouth:  no gingival abnormalities and pharynx pink and moist.   Neck:  supple and no masses.   Lungs:  normal respiratory effort and normal breath sounds.   Heart:  normal  rate and regular rhythm.   Abdomen:  soft, non-tender, and normal bowel sounds.   Msk:  no joint tenderness and no joint swelling.   Extremities:  no edema, no erythema  Neurologic:  cranial nerves II-XII intact and strength normal in all extremities.     Impression & Recommendations:  Problem # 1:  VIRAL INFECTION-UNSPEC (ICD-079.99)  Her updated medication list for this problem includes:    Adult Aspirin Ec Low Strength 81 Mg Tbec (Aspirin) .[name]... 1 by mouth once daily uri - resolving, reassued, no specific tx needed  Problem # 2:  DIABETES MELLITUS, TYPE I, WITH OPHTHALMIC COMPLICATIONS (123XX123)  Her updated medication list for this problem includes:    Lantus Solostar 100 Unit/ml Soln (Insulin glargine) .[name]... Take 40 subcutaneously once daily    Adult Aspirin Ec Low Strength 81 Mg Tbec (Aspirin) .[name]... 1 by mouth once daily    Glimepiride 4 Mg Tabs (Glimepiride) .[name]... 1 by mouth two times a day overcontrolled somwhat with several AM low sugars - ? due to reduced by mouth intake due to above - to decrease the lantus to 36 units, and consider half or none of the PM glimeparide  Problem # 3:  HYPERTENSION (ICD-401.9)  Her updated medication list for this problem includes:    Carvedilol 12.5 Mg Tabs (Carvedilol) .[name]... 1 tab two times a day    Furosemide 40 Mg Tabs (Furosemide) .[name]... 1 tab two times a day  BP today: 130/72 Prior BP: 140/76 (09/05/2009)  Labs Reviewed: K+: 4.4 (08/29/2009) Creat: : 2.6 (08/29/2009)   Chol: 276 (08/29/2009)   HDL: 42.10 (08/29/2009)   LDL: 125 (01/26/2008)   TG: 271.0 (08/29/2009) stable overall by hx and exam, ok to continue meds/tx as is   Complete Medication List: 1)  Simvastatin 40 Mg Tabs (Simvastatin) .[name].. 1po once daily 2)  Carvedilol 12.5 Mg Tabs (Carvedilol) .[name].. 1 tab two times a day 3)  Furosemide 40 Mg Tabs (Furosemide) .[name].. 1 tab two times a day 4)  Lantus Solostar 100 Unit/ml Soln (Insulin glargine) .... Take 40 subcutaneously  once daily 5)  Lantus Solostar Needle  .... Use asd 1 once daily 6)  Onetouch Ultra Test Strp (Glucose blood) .... Use asd two times a day 7)  Onetouch Lancets Misc (Lancets) .... Use asd two times a day 8)  Adult Aspirin Ec Low Strength 81 Mg Tbec (Aspirin) .[name].. 1 by mouth once daily 9)  Glimepiride 4 Mg Tabs (Glimepiride) .[name].. 1 by mouth two times a day  Patient Instructions: 1)  decrease the lantus to 36 units per day 2)  if you still have low sugars in the AM in 2 to 3 days, please decrease the Evening glimeparide  to HALF pill (or none if the sugars are still low  after a few days) 3)  Continue all previous medications as before this visit  4)  Your recent viral illness appears to be resolving 5)  please cancel your appt in a few wks 6)  please re-schedule your next appt for 6 wks with: 7)  BMP prior to visit, ICD-9: 250.02 8)  Lipid Panel prior to visit, ICD-9: 9)  HbgA1C prior to visit, ICD-9:

## 2010-10-02 NOTE — Letter (Signed)
Summary: Primary Care Appointment Letter  [facility] [place]   [place], [place] [address]   Phone: [phone]  Fax: [phone]    01/24/2009 MRN: [id number]  [place] [address] [place], [place]  [address]  Dear Ms. Phifer,   Your Primary Care Physician Biagio Borg MD has indicated that:    _______it is time to schedule an appointment.    _______you missed your appointment on______ and need to call and          reschedule.    _______you need to have lab work done.    _______you need to schedule an appointment discuss lab or test results.    ___X____you need to call to reschedule your appointment that is                       scheduled on 02/19/09 @ 10:15 AM with Dr. Cathlean Cower.     Please call our office as soon as possible. Our phone number is [phone]_________. Please press option 1. Our office is open 8a-12noon and 1p-5p, Monday through Friday.     Thank you,    [place]

## 2010-10-02 NOTE — Assessment & Plan Note (Signed)
Summary: 4 MO ROV /NWS $50   Vital Signs:  Patient profile:   60 year old female Height:      60 inches Weight:      149 pounds BMI:     29.20 O2 Sat:      95 % on Room air Temp:     98 degrees F oral Pulse rate:   74 / minute BP sitting:   140 / 76  (left arm) Cuff size:   regular  Vitals Entered ByShirlean Mylar Ewing (September 05, 2009 3:10 PM)  O2 Flow:  Room air  Preventive Care Screening  Last Flu Shot:    Date:  06/03/2009    Results:  given   CC: 4 Mo Rov/Re   CC:  4 Mo Rov/Re.  History of Present Illness: has been having higher sugars  in the 200' and 300's, so she thought she should decrease the insulin for some reason, is very reluctant to take in the first place and seems to feel she has some discomfort on taking the larger doses - only taking lantus 18 units per day and variable complaince at that;  Pt denies CP, sob, doe, wheezing, orthopnea, pnd, worsening LE edema, palps, dizziness or syncope  Pt denies new neuro symptoms such as headache, facial or extremity weakness   Pt denies polydipsia, polyuria, or low sugar symptoms such as shakiness improved with eating.  Problems Prior to Update: 1)  Secondary Hyperparathyroidism  (ICD-588.81) 2)  Numbness  (ICD-782.0) 3)  Diabetes Mellitus, Type I, With Ophthalmic Complications  (123XX123) 4)  Back Pain  (ICD-724.5) 5)  Renal Insufficiency  (ICD-588.9) 6)  Hypertension  (ICD-401.9) 7)  Anemia-iron Deficiency  (ICD-280.9) 8)  Sinusitis- Acute-nos  (ICD-461.9) 9)  Cervical Radiculopathy, Left  (ICD-723.4) 10)  Preventive Health Care  (ICD-V70.0) 11)  Hyperlipidemia  (ICD-272.4)  Medications Prior to Update: 1)  Simvastatin 40 Mg  Tabs (Simvastatin) .[name].. 1po Once Daily 2)  Carvedilol 12.5 Mg Tabs (Carvedilol) .[name].. 1 Tab Once Daily 3)  Furosemide 40 Mg Tabs (Furosemide) .[name].. 1 Tab Once Daily 4)  Lantus Solostar 100 Unit/ml Soln (Insulin Glargine) .... Take 30 Units Per Day 5)  Lantus Solostar Needle .... Use Asd 1  Once Daily 6)  Onetouch Ultra Test  Strp (Glucose Blood) .... Use Asd Two Times A Day 7)  Onetouch Lancets  Misc (Lancets) .... Use Asd Two Times A Day 8)  Adult Aspirin Ec Low Strength 81 Mg Tbec (Aspirin) .[name].. 1 By Mouth Once Daily 9)  Flexeril 5 Mg Tabs (Cyclobenzaprine Hcl) .[name].. 1 By Mouth Three Times A Day As Needed 10)  Iron 325 (65 Fe) Mg Tabs (Ferrous Sulfate) .[name].. 1po Once Daily  Current Medications (verified): 1)  Simvastatin 40 Mg  Tabs (Simvastatin) .[name].. 1po Once Daily 2)  Carvedilol 12.5 Mg Tabs (Carvedilol) .[name].. 1 Tab Two Times A Day 3)  Furosemide 40 Mg Tabs (Furosemide) .[name].. 1 Tab Two Times A Day 4)  Lantus Solostar 100 Unit/ml Soln (Insulin Glargine) .... Take 40 Subcutaneously Once Daily 5)  Lantus Solostar Needle .... Use Asd 1 Once Daily 6)  Onetouch Ultra Test  Strp (Glucose Blood) .... Use Asd Two Times A Day 7)  Onetouch Lancets  Misc (Lancets) .... Use Asd Two Times A Day 8)  Adult Aspirin Ec Low Strength 81 Mg Tbec (Aspirin) .[name].. 1 By Mouth Once Daily 9)  Glimepiride 4 Mg Tabs (Glimepiride) .[name].. 1 By Mouth Two Times A Day  Allergies (verified): No  Known Drug Allergies  Past History:  Past Medical History: Last updated: 01/25/2008 Hyperlipidemia allergies Diabetes mellitus, type II Anemia-iron deficiency migraine Hypertension  Past Surgical History: Last updated: 01/25/2008 Denies surgical history  Family History: Last updated: 05/21/2009 mother with pancreatic cancer no dm in immediate family  Social History: Last updated: 01/25/2008 Never Smoked Alcohol use-no Single no children [employer] cafeteria worker  Risk Factors: Smoking Status: never (01/25/2008)  Review of Systems  The patient denies anorexia, fever, weight gain, vision loss, decreased hearing, hoarseness, chest pain, syncope, dyspnea on exertion, peripheral edema, prolonged cough, headaches, hemoptysis, abdominal pain, melena, hematochezia, severe indigestion/heartburn,  hematuria, muscle weakness, suspicious skin lesions, transient blindness, difficulty walking, depression, unusual weight change, abnormal bleeding, enlarged lymph nodes, and angioedema.         all otherwise negative per pt -  Physical Exam  General:  alert and well-developed.   Head:  normocephalic and atraumatic.   Eyes:  vision grossly intact, pupils equal, and pupils round.   Ears:  R ear normal and L ear normal.   Nose:  no external deformity and no nasal discharge.   Mouth:  no gingival abnormalities and pharynx pink and moist.   Neck:  supple and no masses.   Lungs:  normal respiratory effort and normal breath sounds.   Heart:  normal rate and regular rhythm.   Abdomen:  soft, non-tender, and normal bowel sounds.   Msk:  no joint tenderness and no joint swelling.   Extremities:  no edema, no erythema  Neurologic:  cranial nerves II-XII intact and strength normal in all extremities.     Impression & Recommendations:  Problem # 1:  Preventive Health Care (ICD-V70.0) Overall doing well, up to date, counseled on routine health concerns for screening and prevention, immunizations up to date or declined, labs reviewed, for tetanus and pneumonia shots today  Problem # 2:  DIABETES MELLITUS, TYPE I, WITH OPHTHALMIC COMPLICATIONS (123XX123)  Her updated medication list for this problem includes:    Lantus Solostar 100 Unit/ml Soln (Insulin glargine) .[name]... Take 40 subcutaneously once daily    Adult Aspirin Ec Low Strength 81 Mg Tbec (Aspirin) .[name]... 1 by mouth once daily    Glimepiride 4 Mg Tabs (Glimepiride) .[name]... 1 by mouth two times a day severe uncontrolled, with cost and complaince being significant factors - treat as above, f/u any worsening signs or symptoms , encouraged compliacne, to f/u next visit with labs  Complete Medication List: 1)  Simvastatin 40 Mg Tabs (Simvastatin) .[name].. 1po once daily 2)  Carvedilol 12.5 Mg Tabs (Carvedilol) .[name].. 1 tab two times a day 3)   Furosemide 40 Mg Tabs (Furosemide) .[name].. 1 tab two times a day 4)  Lantus Solostar 100 Unit/ml Soln (Insulin glargine) .... Take 40 subcutaneously once daily 5)  Lantus Solostar Needle  .... Use asd 1 once daily 6)  Onetouch Ultra Test Strp (Glucose blood) .... Use asd two times a day 7)  Onetouch Lancets Misc (Lancets) .... Use asd two times a day 8)  Adult Aspirin Ec Low Strength 81 Mg Tbec (Aspirin) .[name].. 1 by mouth once daily 9)  Glimepiride 4 Mg Tabs (Glimepiride) .[name].. 1 by mouth two times a day  Other Orders: Tdap => 87yrs IM [id number]) Pneumococcal Vaccine [id number]) Admin 1st Vaccine [id number]) Admin of Any Addtl Vaccine [id number])  Patient Instructions: 1)  you had the tetanus and the pneumonia shot today 2)  increase the lantus to 40 unts per day 3)  re-start the  glimeparide at 4 mg twice per day 4)  call in 2 wks with your blood sugars 5)  Please schedule a follow-up appointment in 6 wks: with: 6)  BMP prior to visit, ICD-9: 250.02 7)  Lipid Panel prior to visit, ICD-9: 8)  HbgA1C prior to visit, ICD-9: Prescriptions: GLIMEPIRIDE 4 MG TABS (GLIMEPIRIDE) 1 by mouth two times a day  #180 x 3   Entered and Authorized by:   Biagio Borg MD   Signed by:   Biagio Borg MD on 09/05/2009   Method used:   Print then Give to Patient   [id number] [place] (LANCETS) use asd two times a day  #200 x 11   Entered and Authorized by:   Biagio Borg MD   Signed by:   Biagio Borg MD on 09/05/2009   Method used:   Print then Give to Patient   RxID:   [id number] [place] TEST  STRP (GLUCOSE BLOOD) use asd two times a day  #200 x 11   Entered and Authorized by:   Biagio Borg MD   Signed by:   Biagio Borg MD on 09/05/2009   Method used:   Print then Give to Patient   RxID:   [id number] LANTUS SOLOSTAR 100 UNIT/ML SOLN (INSULIN GLARGINE) take 40 subcutaneously once daily  #1 box x 11   Entered and Authorized by:   Biagio Borg MD   Signed by:   Biagio Borg MD on 09/05/2009   Method used:   Print then Give to Patient   [id number] FUROSEMIDE 40 MG TABS (FUROSEMIDE) 1 tab two times a day  #180 x 3   Entered and Authorized by:   Biagio Borg MD   Signed by:   Biagio Borg MD on 09/05/2009   Method used:   Print then Give to Patient   [id number] CARVEDILOL 12.5 MG TABS (CARVEDILOL) 1 tab two times a day  #180 x 3   Entered and Authorized by:   Biagio Borg MD   Signed by:   Biagio Borg MD on 09/05/2009   Method used:   Print then Give to Patient   RxID:   [id number] SIMVASTATIN 40 MG  TABS (SIMVASTATIN) 1po once daily  #90 x 3   Entered and Authorized by:   Biagio Borg MD   Signed by:   Biagio Borg MD on 09/05/2009   Method used:   Print then Give to Patient   [id number]    Immunizations Administered:  Tetanus Vaccine:    Vaccine Type: Tdap    Site: right deltoid    Mfr: GlaxoSmithKline    Dose: 0.5 ml    Route: IM    Given by: Robin Ewing    Exp. Date: 03/01/2011    Lot #: [id number]    VIS given: 07/19/07 version given September 05, 2009.  Pneumonia Vaccine:    Vaccine Type: Pneumovax    Site: left deltoid    Mfr: Merck    Dose: 0.5 ml    Route: IM    Given by: Robin Ewing    Exp. Date: 09/27/2010    Lot #: [id number]    VIS given: 03/28/96 version given September 05, 2009.

## 2010-10-02 NOTE — Letter (Signed)
Summary: [facility]   Imported By: Phillis Knack 03/06/2010 12:47:52  _____________________________________________________________________  External Attachment:    Type:   Image     Comment:   External Document

## 2010-10-02 NOTE — Assessment & Plan Note (Signed)
Summary: 85 WK ROV /NWS  #   Vital Signs:  Patient profile:   60 year old female Height:      60 inches Weight:      151.75 pounds BMI:     29.74 O2 Sat:      96 % on Room air Temp:     96.9 degrees F oral Pulse rate:   67 / minute BP sitting:   130 / 80  (left arm) Cuff size:   regular  Vitals Entered ByShirlean Mylar Ewing (November 04, 2009 3:54 PM)  O2 Flow:  Room air CC: 6 week followup/RE   CC:  6 week followup/RE.  History of Present Illness: overall doing ok, denies new complaints;  Pt denies CP, sob, doe, wheezing, orthopnea, pnd, worsening LE edema, palps, dizziness or syncope   Pt denies new neuro symptoms such as headache, facial or extremity weakness   Pt denies polydipsia, polyuria, or low sugar symptoms such as shakiness improved with eating.  Overall good compliance with meds, trying to follow low chol, DM diet, wt stable, little excercise however  CBG's in the AM are in the low to mid 100's but does not check later in the day.    Problems Prior to Update: 1)  Secondary Hyperparathyroidism  (ICD-588.81) 2)  Numbness  (ICD-782.0) 3)  Diabetes Mellitus, Type I, With Ophthalmic Complications  (123XX123) 4)  Back Pain  (ICD-724.5) 5)  Renal Insufficiency  (ICD-588.9) 6)  Hypertension  (ICD-401.9) 7)  Anemia-iron Deficiency  (ICD-280.9) 8)  Sinusitis- Acute-nos  (ICD-461.9) 9)  Cervical Radiculopathy, Left  (ICD-723.4) 10)  Preventive Health Care  (ICD-V70.0) 11)  Hyperlipidemia  (ICD-272.4)  Medications Prior to Update: 1)  Simvastatin 40 Mg  Tabs (Simvastatin) .[name].. 1po Once Daily 2)  Carvedilol 12.5 Mg Tabs (Carvedilol) .[name].. 1 Tab Two Times A Day 3)  Furosemide 40 Mg Tabs (Furosemide) .[name].. 1 Tab Two Times A Day 4)  Lantus Solostar 100 Unit/ml Soln (Insulin Glargine) .... Take 40 Subcutaneously Once Daily 5)  Lantus Solostar Needle .... Use Asd 1 Once Daily 6)  Onetouch Ultra Test  Strp (Glucose Blood) .... Use Asd Two Times A Day 7)  Onetouch Lancets  Misc (Lancets)  .... Use Asd Two Times A Day 8)  Adult Aspirin Ec Low Strength 81 Mg Tbec (Aspirin) .[name].. 1 By Mouth Once Daily 9)  Glimepiride 4 Mg Tabs (Glimepiride) .[name].. 1 By Mouth Two Times A Day  Current Medications (verified): 1)  Simvastatin 40 Mg  Tabs (Simvastatin) .[name].. 1po Once Daily 2)  Carvedilol 12.5 Mg Tabs (Carvedilol) .[name].. 1 Tab Two Times A Day 3)  Furosemide 40 Mg Tabs (Furosemide) .[name].. 1 Tab Two Times A Day 4)  Lantus Solostar 100 Unit/ml Soln (Insulin Glargine) .... Take 40 Subcutaneously Once Daily 5)  Lantus Solostar Needle .... Use Asd 1 Once Daily 6)  Onetouch Ultra Test  Strp (Glucose Blood) .... Use Asd Two Times A Day 7)  Onetouch Lancets  Misc (Lancets) .... Use Asd Two Times A Day 8)  Adult Aspirin Ec Low Strength 81 Mg Tbec (Aspirin) .[name].. 1 By Mouth Once Daily 9)  Glimepiride 4 Mg Tabs (Glimepiride) .[name].. 1 By Mouth Two Times A Day 10)  Januvia 100 Mg Tabs (Sitagliptin Phosphate) .[name].. 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   Head:  normocephalic and atraumatic.   Eyes:  vision grossly intact, pupils equal, and pupils round.   Ears:  R ear normal and L ear normal.   Nose:  no external deformity and no nasal discharge.   Mouth:  no gingival abnormalities and pharynx pink and moist.   Neck:  supple and no masses.   Lungs:  normal respiratory effort and normal breath sounds.   Heart:  normal rate and regular rhythm.     Extremities:  no edema, no erythema    Impression & Recommendations:  Problem # 1:  DIABETES MELLITUS, UNCONTROLLED (ICD-250.02)  Her updated medication list for this problem includes:    Lantus Solostar 100 Unit/ml Soln (Insulin glargine) .[name]... Take 40 subcutaneously once daily    Adult Aspirin Ec Low Strength 81 Mg Tbec (Aspirin) .[name]... 1 by mouth once daily    Glimepiride 4 Mg Tabs (Glimepiride) .[name]... 1 by mouth two times a day    Januvia 100 Mg Tabs (Sitagliptin phosphate) .[name]... 1 by mouth once daily with elevated sugars during the day, best controlled first in the AM - to add the januvia 100 mg qam (not metformin candidate due to cr too high)  Labs Reviewed: Creat: 2.2 (10/29/2009)    Reviewed HgBA1c results: 10.8 (10/29/2009)  14.2 (08/29/2009)  Problem # 2:  RENAL INSUFFICIENCY (ICD-588.9) stable overall by hx and exam, ok to continue meds/tx as is   Problem # 3:  HYPERTENSION (ICD-401.9)  Her updated medication list for this problem includes:    Carvedilol 12.5 Mg Tabs (Carvedilol) .[name]... 1 tab two times a day    Furosemide 40 Mg Tabs (Furosemide) .[name]... 1 tab two times a day  BP today: 130/80 Prior BP: 130/72 (09/23/2009)  Labs Reviewed: K+: 4.1 (10/29/2009) Creat: : 2.2 (10/29/2009)   Chol: 150 (10/29/2009)   HDL: 49.30 (10/29/2009)   LDL: 85 (10/29/2009)   TG: 78.0 (10/29/2009) stable overall by hx and exam, ok to continue meds/tx as is   Problem # 4:  HYPERLIPIDEMIA (ICD-272.4)  Her updated medication list for this problem includes:    Simvastatin 40 Mg Tabs (Simvastatin) .[name]... 1po once daily  Labs Reviewed: SGOT: 16 (08/29/2009)   SGPT: 14 (08/29/2009)   HDL:49.30 (10/29/2009), 42.10 (08/29/2009)  LDL:85 (10/29/2009), 125 (01/26/2008)  Chol:150 (10/29/2009), 276 (08/29/2009)  Trig:78.0 (10/29/2009), 271.0 (08/29/2009) stable overall by hx and exam, ok to continue meds/tx as is , goal ldl less than 70  Complete Medication List: 1)  Simvastatin 40 Mg Tabs  (Simvastatin) .[name].. 1po once daily 2)  Carvedilol 12.5 Mg Tabs (Carvedilol) .[name].. 1 tab two times a day 3)  Furosemide 40 Mg Tabs (Furosemide) .[name].. 1 tab two times a day 4)  Lantus Solostar 100 Unit/ml Soln (Insulin glargine) .... Take 40 subcutaneously once daily 5)  Lantus Solostar Needle  .... Use asd 1 once daily 6)  Onetouch Ultra Test Strp (Glucose blood) .... Use asd two times a day 7)  Onetouch Lancets Misc (Lancets) .... Use asd two times a day 8)  Adult Aspirin Ec Low Strength 81 Mg Tbec (Aspirin) .[name].. 1 by mouth once daily 9)  Glimepiride 4 Mg Tabs (Glimepiride) .[name].. 1 by  mouth two times a day 10)  Januvia 100 Mg Tabs (Sitagliptin phosphate) .[name].. 1 by mouth once daily  Patient Instructions: 1)  Please take all new medications as prescribed - the januvia was sent to the pharmacy 2)  Continue all previous medications as before this visit  3)  Continue to check your blood sugars daily , but check some in the evening before dinner sometimes (not alway in the morning) to keep track of the sugars later during the day 4)  Please schedule a follow-up appointment in 6 wks with: 5)  BMP prior to visit, ICD-9: 250.02 6)  Lipid Panel prior to visit, ICD-9: 7)  HbgA1C prior to visit, ICD-9: Prescriptions: JANUVIA 100 MG TABS (SITAGLIPTIN PHOSPHATE) 1 by mouth once daily  #30 x 11   Entered and Authorized by:   Biagio Borg MD   Signed by:   Biagio Borg MD on 11/04/2009   Method used:   Electronically to        [facility] (retail)       [address]       [place],   [address]       Ph: [phone]       Fax: [phone]   RxID:   [id number]

## 2010-10-21 ENCOUNTER — Encounter: Payer: Self-pay | Admitting: Internal Medicine

## 2010-11-06 ENCOUNTER — Telehealth: Payer: Self-pay | Admitting: Internal Medicine

## 2010-11-06 NOTE — Letter (Signed)
Summary: Carolyn Meckel MD  Carolyn Meckel MD   Imported By: Bubba Hales 10/29/2010 09:43:18  _____________________________________________________________________  External Attachment:    Type:   Image     Comment:   External Document

## 2010-11-10 LAB — GLUCOSE, CAPILLARY: Glucose-Capillary: 335 mg/dL — ABNORMAL HIGH (ref 70–99)

## 2010-11-11 NOTE — Progress Notes (Signed)
  Phone Note Refill Request Message from:  Fax from Pharmacy on November 06, 2010 10:57 AM  Refills Requested: Medication #1:  JANUVIA 100 MG TABS 1 by mouth once daily.   Dosage confirmed as above?Dosage Confirmed   Last Refilled: 10/2009   Notes: [facility] Initial call taken by: Shirlean Mylar Ewing CMA (AAMA),  November 06, 2010 10:57 AM    Prescriptions: JANUVIA 100 MG TABS (SITAGLIPTIN PHOSPHATE) 1 by mouth once daily  #30 x 0   Entered by:   Sharon Seller CMA ([facility] [facility])   Authorized by:   Biagio Borg MD   Signed by:   Sharon Seller CMA ([place]) on 11/06/2010   Method used:   Faxed to ...       [facility] ([facility]       [address]       [place], [place]  [address]       Ph: [phone]       Fax: [phone]   RxID:   [id number]   Appended Document: RX refill    Prescriptions: JANUVIA 100 MG TABS (SITAGLIPTIN PHOSPHATE) 1 by mouth once daily  #30 x 0   Entered by:   Crissie Sickles, CMA   Authorized by:   Biagio Borg MD   Signed by:   Crissie Sickles, CMA on 11/06/2010   Method used:   Electronically to        [facility] (retail)       [address]       [place], [place]  [address]       Ph: [phone]       Fax: [phone]   RxID:   [id number]    Appended Document:  Pharmacy called stating refill was deleted accidentally. Requested eRx be resent.

## 2010-11-14 LAB — FERRITIN: Ferritin: 44 ng/mL (ref 10–291)

## 2010-11-14 LAB — RENAL FUNCTION PANEL
Albumin: 3.7 g/dL (ref 3.5–5.2)
BUN: 57 mg/dL — ABNORMAL HIGH (ref 6–23)
CO2: 24 mEq/L (ref 19–32)
Calcium: 8.7 mg/dL (ref 8.4–10.5)
Chloride: 100 mEq/L (ref 96–112)
Creatinine, Ser: 3.49 mg/dL — ABNORMAL HIGH (ref 0.4–1.2)
GFR calc Af Amer: 16 mL/min — ABNORMAL LOW (ref >60)
GFR calc non Af Amer: 14 mL/min — ABNORMAL LOW (ref >60)
Glucose, Bld: 358 mg/dL — ABNORMAL HIGH (ref 70–99)
Phosphorus: 4.9 mg/dL — ABNORMAL HIGH (ref 2.3–4.6)
Potassium: 4.6 mEq/L (ref 3.5–5.1)
Sodium: 135 mEq/L (ref 135–145)

## 2010-11-14 LAB — PTH, INTACT AND CALCIUM
Calcium, Total (PTH): 8.8 mg/dL (ref 8.4–10.5)
PTH: 506.4 pg/mL — ABNORMAL HIGH (ref 14.0–72.0)

## 2010-11-14 LAB — IRON AND TIBC
Iron: 57 ug/dL (ref 42–135)
Saturation Ratios: 20 % (ref 20–55)
TIBC: 284 ug/dL (ref 250–470)
UIBC: 227 ug/dL

## 2010-11-14 LAB — POCT HEMOGLOBIN-HEMACUE: Hemoglobin: 10.1 g/dL — ABNORMAL LOW (ref 12.0–15.0)

## 2010-11-15 LAB — GLUCOSE, CAPILLARY
Glucose-Capillary: 194 mg/dL — ABNORMAL HIGH (ref 70–99)
Glucose-Capillary: 197 mg/dL — ABNORMAL HIGH (ref 70–99)

## 2010-11-15 LAB — CBC
HCT: 31.4 % — ABNORMAL LOW (ref 36.0–46.0)
Hemoglobin: 10.6 g/dL — ABNORMAL LOW (ref 12.0–15.0)
MCH: 31.1 pg (ref 26.0–34.0)
MCHC: 33.7 g/dL (ref 30.0–36.0)
MCV: 92.3 fL (ref 78.0–100.0)
Platelets: 351 10*3/uL (ref 150–400)
RBC: 3.4 MIL/uL — ABNORMAL LOW (ref 3.87–5.11)
RDW: 13.8 % (ref 11.5–15.5)
WBC: 7.6 10*3/uL (ref 4.0–10.5)

## 2010-11-15 LAB — BASIC METABOLIC PANEL
BUN: 48 mg/dL — ABNORMAL HIGH (ref 6–23)
CO2: 30 mEq/L (ref 19–32)
Calcium: 9.8 mg/dL (ref 8.4–10.5)
Chloride: 100 mEq/L (ref 96–112)
Creatinine, Ser: 3.51 mg/dL — ABNORMAL HIGH (ref 0.4–1.2)
GFR calc Af Amer: 16 mL/min — ABNORMAL LOW (ref >60)
GFR calc non Af Amer: 13 mL/min — ABNORMAL LOW (ref >60)
Glucose, Bld: 188 mg/dL — ABNORMAL HIGH (ref 70–99)
Potassium: 4.2 mEq/L (ref 3.5–5.1)
Sodium: 138 mEq/L (ref 135–145)

## 2010-11-15 LAB — POCT HEMOGLOBIN-HEMACUE: Hemoglobin: 9.3 g/dL — ABNORMAL LOW (ref 12.0–15.0)

## 2010-11-15 LAB — SURGICAL PCR SCREEN
MRSA, PCR: NEGATIVE
Staphylococcus aureus: NEGATIVE

## 2010-11-16 LAB — POCT HEMOGLOBIN-HEMACUE: Hemoglobin: 8.9 g/dL — ABNORMAL LOW (ref 12.0–15.0)

## 2010-11-16 LAB — IRON AND TIBC
Iron: 48 ug/dL (ref 42–135)
Saturation Ratios: 17 % — ABNORMAL LOW (ref 20–55)
TIBC: 282 ug/dL (ref 250–470)
UIBC: 234 ug/dL

## 2010-11-16 LAB — FERRITIN: Ferritin: 79 ng/mL (ref 10–291)

## 2010-11-16 LAB — PTH, INTACT AND CALCIUM
Calcium, Total (PTH): 8.9 mg/dL (ref 8.4–10.5)
PTH: 598.4 pg/mL — ABNORMAL HIGH (ref 14.0–72.0)

## 2010-11-24 ENCOUNTER — Encounter (HOSPITAL_COMMUNITY): Payer: BC Managed Care – PPO | Attending: Nephrology

## 2010-11-24 DIAGNOSIS — D509 Iron deficiency anemia, unspecified: Secondary | ICD-10-CM | POA: Insufficient documentation

## 2010-11-25 ENCOUNTER — Other Ambulatory Visit: Payer: Self-pay | Admitting: Internal Medicine

## 2010-11-25 NOTE — Telephone Encounter (Signed)
To robin   

## 2010-11-26 ENCOUNTER — Other Ambulatory Visit: Payer: Self-pay

## 2010-11-26 MED ORDER — FUROSEMIDE 40 MG PO TABS: 40.0000 mg | ORAL_TABLET | Freq: Two times a day (BID) | ORAL | Status: DC

## 2010-11-26 NOTE — Telephone Encounter (Signed)
Pharmacy requesting refill on Furosemide 40 mg. Last refill 09/15/09 and last OV 11/04/09.

## 2010-11-29 DIAGNOSIS — I10 Essential (primary) hypertension: Secondary | ICD-10-CM

## 2010-12-08 ENCOUNTER — Other Ambulatory Visit (INDEPENDENT_AMBULATORY_CARE_PROVIDER_SITE_OTHER): Payer: BC Managed Care – PPO

## 2010-12-08 ENCOUNTER — Encounter: Payer: Self-pay | Admitting: Internal Medicine

## 2010-12-08 ENCOUNTER — Other Ambulatory Visit: Payer: Self-pay

## 2010-12-08 ENCOUNTER — Ambulatory Visit (INDEPENDENT_AMBULATORY_CARE_PROVIDER_SITE_OTHER): Payer: BC Managed Care – PPO | Admitting: Internal Medicine

## 2010-12-08 VITALS — BP 110/60 | HR 69 | Temp 98.4°F | Ht 60.0 in | Wt 152.4 lb

## 2010-12-08 DIAGNOSIS — Z0000 Encounter for general adult medical examination without abnormal findings: Secondary | ICD-10-CM

## 2010-12-08 DIAGNOSIS — IMO0001 Reserved for inherently not codable concepts without codable children: Secondary | ICD-10-CM

## 2010-12-08 DIAGNOSIS — H109 Unspecified conjunctivitis: Secondary | ICD-10-CM | POA: Insufficient documentation

## 2010-12-08 DIAGNOSIS — E119 Type 2 diabetes mellitus without complications: Secondary | ICD-10-CM

## 2010-12-08 DIAGNOSIS — M79609 Pain in unspecified limb: Secondary | ICD-10-CM

## 2010-12-08 DIAGNOSIS — H538 Other visual disturbances: Secondary | ICD-10-CM

## 2010-12-08 DIAGNOSIS — J309 Allergic rhinitis, unspecified: Secondary | ICD-10-CM

## 2010-12-08 DIAGNOSIS — M79673 Pain in unspecified foot: Secondary | ICD-10-CM

## 2010-12-08 HISTORY — DX: Allergic rhinitis, unspecified: J30.9

## 2010-12-08 LAB — CBC WITH DIFFERENTIAL/PLATELET
Basophils Absolute: 0 10*3/uL (ref 0.0–0.1)
Basophils Relative: 0.4 % (ref 0.0–3.0)
Eosinophils Absolute: 0 10*3/uL (ref 0.0–0.7)
Eosinophils Relative: 0.5 % (ref 0.0–5.0)
HCT: 29 % — ABNORMAL LOW (ref 36.0–46.0)
Hemoglobin: 9.9 g/dL — ABNORMAL LOW (ref 12.0–15.0)
Lymphocytes Relative: 19.9 % (ref 12.0–46.0)
Lymphs Abs: 1.5 10*3/uL (ref 0.7–4.0)
MCHC: 34.3 g/dL (ref 30.0–36.0)
MCV: 91.9 fl (ref 78.0–100.0)
Monocytes Absolute: 0.6 10*3/uL (ref 0.1–1.0)
Monocytes Relative: 7.5 % (ref 3.0–12.0)
Neutro Abs: 5.5 10*3/uL (ref 1.4–7.7)
Neutrophils Relative %: 71.7 % (ref 43.0–77.0)
Platelets: 303 10*3/uL (ref 150.0–400.0)
RBC: 3.15 Mil/uL — ABNORMAL LOW (ref 3.87–5.11)
RDW: 13.1 % (ref 11.5–14.6)
WBC: 7.7 10*3/uL (ref 4.5–10.5)

## 2010-12-08 LAB — URINALYSIS, ROUTINE W REFLEX MICROSCOPIC
Bilirubin Urine: NEGATIVE
Hgb urine dipstick: NEGATIVE
Ketones, ur: NEGATIVE
Leukocytes, UA: NEGATIVE
Nitrite: NEGATIVE
Specific Gravity, Urine: 1.025 (ref 1.000–1.030)
Total Protein, Urine: 30
Urine Glucose: NEGATIVE
Urobilinogen, UA: 0.2 (ref 0.0–1.0)
pH: 5.5 (ref 5.0–8.0)

## 2010-12-08 LAB — HEPATIC FUNCTION PANEL
ALT: 10 U/L (ref 0–35)
AST: 16 U/L (ref 0–37)
Albumin: 3.6 g/dL (ref 3.5–5.2)
Alkaline Phosphatase: 77 U/L (ref 39–117)
Bilirubin, Direct: 0.1 mg/dL (ref 0.0–0.3)
Total Bilirubin: 0.6 mg/dL (ref 0.3–1.2)
Total Protein: 7.4 g/dL (ref 6.0–8.3)

## 2010-12-08 LAB — BASIC METABOLIC PANEL
BUN: 62 mg/dL — ABNORMAL HIGH (ref 6–23)
CO2: 27 mEq/L (ref 19–32)
Calcium: 8.4 mg/dL (ref 8.4–10.5)
Chloride: 95 mEq/L — ABNORMAL LOW (ref 96–112)
Creatinine, Ser: 4.1 mg/dL — ABNORMAL HIGH (ref 0.4–1.2)
GFR: 14.31 mL/min — CL (ref >60.00)
Glucose, Bld: 209 mg/dL — ABNORMAL HIGH (ref 70–99)
Potassium: 4.4 mEq/L (ref 3.5–5.1)
Sodium: 133 mEq/L — ABNORMAL LOW (ref 135–145)

## 2010-12-08 LAB — LIPID PANEL
Cholesterol: 164 mg/dL (ref 0–200)
HDL: 36.8 mg/dL — ABNORMAL LOW (ref >39.00)
LDL Cholesterol: 101 mg/dL — ABNORMAL HIGH (ref 0–99)
Total CHOL/HDL Ratio: 4
Triglycerides: 132 mg/dL (ref 0.0–149.0)
VLDL: 26.4 mg/dL (ref 0.0–40.0)

## 2010-12-08 LAB — TSH: TSH: 1.65 u[IU]/mL (ref 0.35–5.50)

## 2010-12-08 LAB — HEMOGLOBIN A1C: Hgb A1c MFr Bld: 12.3 % — ABNORMAL HIGH (ref 4.6–6.5)

## 2010-12-08 MED ORDER — SITAGLIPTIN PHOSPHATE 100 MG PO TABS: 100.0000 mg | ORAL_TABLET | Freq: Every day | ORAL | Status: DC

## 2010-12-08 MED ORDER — CARVEDILOL 25 MG PO TABS: 25.0000 mg | ORAL_TABLET | Freq: Two times a day (BID) | ORAL | Status: DC

## 2010-12-08 MED ORDER — GLIMEPIRIDE 4 MG PO TABS: 4.0000 mg | ORAL_TABLET | Freq: Two times a day (BID) | ORAL | Status: DC

## 2010-12-08 MED ORDER — FEXOFENADINE HCL 180 MG PO TABS: 180.0000 mg | ORAL_TABLET | Freq: Every day | ORAL | Status: DC

## 2010-12-08 MED ORDER — TOBRAMYCIN 0.3 % OP SOLN: 1.0000 [drp] | Freq: Four times a day (QID) | OPHTHALMIC | Status: AC

## 2010-12-08 NOTE — Patient Instructions (Addendum)
Please remember to followup with your GYN for the yearly pap smear and/or mammogram  - such as at [facility] on [address] Take all new medications as prescribed - the eye drops for the right eye, and the allegra for the allergies Continue all other medications as before, including the carvedilol at 25 mg twice per day You will be contacted regarding the referral for: podiatry for the left heel, and opthomology for your blurred vision Please go to LAB in the Basement for the blood and/or urine tests to be done today Please call the number on the Blue Card (the [facility]) for results of testing in 2-3 days Please return in 6 mo with Lab testing done 3-5 days before

## 2010-12-08 NOTE — Assessment & Plan Note (Signed)
?   Plantar fasciitis vs bone spur - refer to podiatry

## 2010-12-08 NOTE — Assessment & Plan Note (Signed)
Mild to mod - for tobrex asd,   to f/u any worsening symptoms or concerns

## 2010-12-08 NOTE — Assessment & Plan Note (Signed)
Mild uncontrolled - for allegra prn ,  to f/u any worsening symptoms or concerns

## 2010-12-08 NOTE — Assessment & Plan Note (Signed)
Unclear , difficult exam today given the conjucntivitis - for optho referral

## 2010-12-08 NOTE — Progress Notes (Signed)
Subjective:    Patient ID: Carolyn Valenzuela Comment, female    DOB: [birth year], 60 y.o.   MRN: [id number]  HPI Here for wellness and f/u;  Overall doing ok;  Pt denies CP, worsening SOB, DOE, wheezing, orthopnea, PND, worsening LE edema, palpitations, dizziness or syncope.  Pt denies neurological change such as new Headache, facial or extremity weakness.  Pt denies polydipsia, polyuria, or low sugar symptoms. Pt states overall good compliance with treatment and medications, good tolerability, and trying to follow lower cholesterol diet.  Pt denies worsening depressive symptoms, suicidal ideation or panic. No fever, wt loss, night sweats, loss of appetite, or other constitutional symptoms.  Pt states good ability with ADL's, low fall risk, home safety reviewed and adequate, no significant changes in hearing or vision, and occasionally active with exercise.  CBG's in the 100's to 200's.  Has known near end stage renal fxn, may need upper am fistula place soon for dialysis.  Does have pain to the left plantar heel, not seeing podiatry yet yrly, and has missed seeing optho in the past 1-2 yrs as well; has had some blurry vision on the right with "specks" occassionaly .  Also with occasional  Headaches, and sinus congestion with post nasal gtt and cough, not helped with OTC cough prep.  Did has rare nose bleed today as well, but no fever , colored d/c, but has had some anterior ST for 2 days, mild.  Pt denies fever.  Med review shows pt apparently taking 25 mg bid coreg instead of our prescribed 12.5 bid.  Past Medical History  Diagnosis Date  . DIABETES MELLITUS, UNCONTROLLED 05/21/2009  . HYPERLIPIDEMIA 03/30/2007  . ANEMIA-IRON DEFICIENCY 01/25/2008  . HYPERTENSION 01/25/2008  . SINUSITIS- ACUTE-NOS 01/25/2008  . SECONDARY HYPERPARATHYROIDISM 05/21/2009  . RENAL INSUFFICIENCY 02/01/2008  . CERVICAL RADICULOPATHY, LEFT 01/25/2008  . BACK PAIN 05/02/2009  . NUMBNESS 05/21/2009  . Allergic rhinitis, cause unspecified  12/08/2010   No past surgical history on file.  reports that she has never smoked. She does not have any smokeless tobacco history on file. She reports that she does not drink alcohol. Her drug history not on file. family history includes Cancer in her mother. Allergies  Allergen Reactions  . Oxycodone Nausea And Vomiting   Current Outpatient Prescriptions on File Prior to Visit  Medication Sig Dispense Refill  . aspirin 81 MG EC tablet Take 81 mg by mouth daily.        . furosemide (LASIX) 40 MG tablet TAKE ONE TABLET BY MOUTH TWICE DAILY  60 tablet  1  . furosemide (LASIX) 40 MG tablet Take 1 tablet (40 mg total) by mouth 2 (two) times daily.  60 tablet  1  . glimepiride (AMARYL) 4 MG tablet Take 4 mg by mouth 2 (two) times daily.        [name] glucose blood (ONE TOUCH ULTRA TEST) test strip 1 each. Use as directed two times a day       . insulin glargine (LANTUS SOLOSTAR) 100 UNIT/ML injection Inject 40 Units into the skin daily.        . ONE TOUCH LANCETS MISC Use as directed two times a day       . simvastatin (ZOCOR) 40 MG tablet Take 40 mg by mouth daily.        . sitaGLIPtan (JANUVIA) 100 MG tablet Take 100 mg by mouth daily.        [name] DISCONTD: carvedilol (COREG) 12.5 MG tablet Take 12.5 mg  by mouth 2 (two) times daily.         Review of Systems Review of Systems  Constitutional: Negative for diaphoresis, activity change, appetite change and unexpected weight change.  HENT: Negative for hearing loss, ear pain, facial swelling, mouth sores and neck stiffness.   Eyes: Negative for pain, redness and visual disturbance.  Respiratory: Negative for shortness of breath and wheezing.   Cardiovascular: Negative for chest pain and palpitations.  Gastrointestinal: Negative for diarrhea, blood in stool, abdominal distention and rectal pain.  Genitourinary: Negative for hematuria, flank pain and decreased urine volume.  Musculoskeletal: Negative for myalgias and joint swelling.  Skin: Negative  for color change and wound.  Neurological: Negative for syncope and numbness.  Hematological: Negative for adenopathy.  Psychiatric/Behavioral: Negative for hallucinations, self-injury, decreased concentration and agitation.      Objective:   Physical Exam BP 110/60  Pulse 69  Temp(Src) 98.4 F (36.9 C) (Oral)  Ht 5' (1.524 m)  Wt 152 lb 6 oz (69.117 kg)  BMI 29.76 kg/m2  SpO2 97% Physical Exam  VS noted Constitutional: Pt is oriented to person, place, and time. Appears well-developed and well-nourished.  HENT:  Head: Normocephalic and atraumatic.  Right Ear: External ear normal. Right conjucnt with marked injection/erythema and somewhat cloudy d/c Left Ear: External ear normal.  bilat tm's mild erythema Nose: Nose normal.  Except somewhat congested as well with clearish drainage Mouth/Throat: Oropharynx is clear and moist. but with cobblestoning appearance to the post pharynx Eyes: Conjunctivae and EOM are normal. Pupils are equal, round, and reactive to light.  Neck: Normal range of motion. Neck supple. No JVD present. No tracheal deviation present.  Cardiovascular: Normal rate, regular rhythm, normal heart sounds and intact distal pulses.   Pulmonary/Chest: Effort normal and breath sounds normal.  Abdominal: Soft. Bowel sounds are normal. There is no tenderness.  Musculoskeletal: Normal range of motion. Exhibits no edema.  left plantar heel with marked tender, but no swelling, erythema, or ulceration Lymphadenopathy:  Has no cervical adenopathy.  Neurological: Pt is alert and oriented to person, place, and time. Pt has normal reflexes. No cranial nerve deficit.  Skin: Skin is warm and dry. No rash noted.  Psychiatric:  Has  normal mood and affect. Behavior is normal.          Assessment & Plan:

## 2010-12-08 NOTE — Assessment & Plan Note (Addendum)
stable overall by hx and exam, most recent lab reviewed with pt, and pt to continue medical treatment as before, to check labs today  Lab Results  Component Value Date   HGBA1C 9.3* 12/10/2009

## 2010-12-09 ENCOUNTER — Other Ambulatory Visit: Payer: Self-pay | Admitting: Internal Medicine

## 2010-12-09 DIAGNOSIS — E119 Type 2 diabetes mellitus without complications: Secondary | ICD-10-CM

## 2010-12-10 ENCOUNTER — Telehealth: Payer: Self-pay

## 2010-12-10 ENCOUNTER — Other Ambulatory Visit: Payer: Self-pay

## 2010-12-10 MED ORDER — INSULIN GLARGINE 100 UNIT/ML ~~LOC~~ SOLN: 55.0000 [IU] | Freq: Every day | SUBCUTANEOUS | Status: DC

## 2010-12-10 MED ORDER — BENZONATATE 100 MG PO CAPS: 100.0000 mg | ORAL_CAPSULE | Freq: Two times a day (BID) | ORAL | Status: DC | PRN

## 2010-12-10 NOTE — Telephone Encounter (Signed)
Rxs refilled as requested

## 2011-01-01 ENCOUNTER — Ambulatory Visit: Payer: BC Managed Care – PPO | Admitting: Endocrinology

## 2011-01-01 DIAGNOSIS — Z0289 Encounter for other administrative examinations: Secondary | ICD-10-CM

## 2011-01-13 NOTE — Consult Note (Signed)
NEW PATIENT CONSULTATION   Carolyn Valenzuela, Carolyn Valenzuela  DOB:  08-May-1951                                       03/20/2010  CHART#:[id number]   I saw the patient in the office today in consultation for hemodialysis  access.  She was referred by Dr. Moshe Cipro.  This is a pleasant 60-  year-old right-handed woman with chronic kidney disease related to  diabetes and hypertension who was referred Dr. Moshe Cipro for access.  Her kidney disease has been gradually progressive.  She has had no  recent symptoms related to her chronic kidney disease.  Specifically she  denies any fatigue, anorexia, nausea, vomiting, palpitations or  shortness of breath.   PAST MEDICAL HISTORY:  Significant for insulin-dependent diabetes,  hypertension and chronic kidney disease.  She denies any history of  hypercholesterolemia, history of previous myocardial infarction, history  of congestive heart failure or history of COPD.   SOCIAL HISTORY:  She is single.  She does not have any children.  She  does not use tobacco.   FAMILY HISTORY:  She is unaware of any history of premature  cardiovascular disease.   REVIEW OF SYSTEMS:  GENERAL:  She has had some weight gain.  She is 5  feet tall and 160 pounds.  CARDIOVASCULAR:  She has had no chest pain, chest pressure, palpitations  or arrhythmias.  She has had some pain in her legs with walking.  She  has had no history of stroke, TIAs or amaurosis fugax.  She has had no  history of DVT or phlebitis.  NEUROLOGIC:  She has occasional headaches.  MUSCULOSKELETAL:  She has some arthritis.  GI, PULMONARY, HEMATOLOGIC, GU, ENT, PSYCHIATRIC, INTEGUMENTARY:  Review  of systems is unremarkable and is documented on the medical history form  in her chart.   PHYSICAL EXAMINATION:  This is a pleasant 60 year old woman who appears  her stated age.  Blood pressure 124/74, heart rate is 78, temperature is  97.8.  HEENT:  Unremarkable.  Lungs:  Clear  bilaterally to auscultation  without rales, rhonchi or wheezing.  Cardiovascular:  I do not detect  any carotid bruits.  She has a regular rate and rhythm.  She has  palpable radial and brachial pulses bilaterally.  She has palpable  femoral pulses with warm and well-perfused feet.  No ischemic ulcers.  Abdomen:  Soft and nontender with normal pitched bowel sounds.  Musculoskeletal:  No major deformities or cyanosis.  Neurologic:  No  focal weakness or paresthesias.  Skin:  There are no ulcers or rashes.   I have independently interpreted her vein mapping today which shows a  reasonable sized forearm and upper arm cephalic vein bilaterally and  also a reasonable basilic vein bilaterally.   I have reviewed her lab studies which show a creatinine of 3.69, BUN is  64.  This was on February 18, 2010.  GFR was 13.   Given that she is right-handed, I have recommended that we explore her  forearm cephalic vein on the left.  If this is adequate, we will place a  radiocephalic fistula.  If this was not adequate, we will explore upper  arm cephalic vein for a possible brachiocephalic fistula.  If neither are adequate, she could potentially have a basilic vein  transposition.  If she was not a candidate for a fistula then she  would  have had an AV graft.  I have discussed the indications for surgery and  the potential complications including but not limited to failure of the  fistula to mature, graft thrombosis, graft infection, steal syndrome,  arm swelling, wound healing problems and bleeding problems.  All of her  questions were answered.  She is agreeable to proceed.  Her surgery has  been scheduled for 03/28/2010.     Judeth Cornfield. Scot Dock, M.D.  Electronically Signed   CSD/MEDQ  D:  03/20/2010  T:  03/21/2010  Job:  [id number]   cc:   Louis Meckel, M.D.

## 2011-01-13 NOTE — Assessment & Plan Note (Signed)
OFFICE VISIT   Carolyn Valenzuela, Carolyn Valenzuela  DOB:  [birth year]                                       05/21/2010  CHART#:[id number]   I saw the patient in the office today for followup after her recent left  forearm AV fistula.  This was done on 03/28/2010.  In reviewing her op  note, this was a 4 mm vein.  She comes in today to check on the  maturation of her fistula.  She has no real symptoms referable to the  surgical site.   On examination blood pressure is 125/74, heart rate is 78, temperature  is 98.2.  Her fistula is occluded.  I reviewed her previous vein map.  The upper arm cephalic vein is slightly larger than the forearm cephalic  vein.  The basilic vein on the left appears to be reasonable size.   I have recommended that we explore her upper arm cephalic vein on the  left.  If this is adequate place an upper arm brachiocephalic fistula.  If this is not adequate explore her basilic vein for a possible basilic  vein transposition.  If neither are adequate she would require an AV  graft.  Currently she is not on dialysis.  She needs to discuss this  with her boss at work before she can schedule surgery.  She will let us  know.     Judeth Cornfield. Scot Dock, M.D.  Electronically Signed   CSD/MEDQ  D:  05/21/2010  T:  05/22/2010  Job:  [id number]   cc:   Louis Meckel, M.D.

## 2011-01-13 NOTE — Procedures (Signed)
CEPHALIC VEIN MAPPING   INDICATION:  Preop AVF vein mapping.   HISTORY:  Chronic kidney disease.   EXAM:  The right cephalic and basilic veins are compressible.   Cephalic diameter measurements range from 0.15 cm to 0.64 cm, however,  0.44 cm to 0.64 cm in the brachium.   Basilic diameter measurements range from 0.37 cm to 0.5 cm.   The left cephalic vein and basilic vein are compressible.   Cephalic diameter measurements range from 0.34 cm to 0.48 cm.   Basilic diameter measurements range from 0.44 cm to 0.6 cm.   See attached worksheet for all measurements.   IMPRESSION:  Patent bilateral cephalic and basilic veins with diameters  as described above.   ___________________________________________  Judeth Cornfield. Scot Dock, M.D.   AS/MEDQ  D:  03/20/2010  T:  03/20/2010  Job:  [id number]

## 2011-01-16 NOTE — Assessment & Plan Note (Signed)
[facility]                           GASTROENTEROLOGY OFFICE NOTE   MADELEY, ZAPALAC                      MRN:          [id number]  DATE:03/15/2006                            DOB:          April 24, 1951    OFFICE CONSULTATION:   REFERRING PHYSICIAN:  Biagio Borg, M.D.   REASON FOR REFERRAL:  Anemia.   HISTORY OF PRESENT ILLNESS:  Ms. Carolyn Valenzuela is a 60 year old female who related  no prior problems with anemia.  She was recently found to have a hemoglobin  of 10.5 with a normal MCV.  Iron studies revealed a low normal iron  saturation at 20.1%.  B-12 and folate were normal.  CMET was remarkable for  an elevated glucose at 147 and an elevated BUN at 33.  She notes no change  in bowel habits, melena, hematochezia or weight loss.  She does relate some  very intermittent sharp suprapubic pains which are brief and infrequent.  She occasionally has some mild gas and bloating.  She states her appetite  changes frequently related to her blood sugar level.  She has not previously  had colonoscopy or upper endoscopy.  There is no family history of colon  cancer, colon polyps or inflammatory bowel disease.   PAST MEDICAL HISTORY:  1.  Hypertension.  2.  Diabetes mellitus.  3.  Hyperlipidemia.  4.  Headaches.  5.  History of urinary tract infections.   CURRENT MEDICATIONS:  Current medications listed on the chart obtained and  reviewed.   MEDICATION ALLERGIES:  None known.   SOCIAL HISTORY/REVIEW OF SYSTEMS:  Per the handwritten diagnostic evaluation  form.   PHYSICAL EXAMINATION:  GENERAL:  In no acute distress.  VITAL SIGNS:  Height 5 feet, 1 inch.  Weight 143.8 pounds.  Blood pressure  is 128/60, pulse 88 and regular.  HEENT:  Anicteric sclera.  Oropharynx clear.  CHEST:  Clear to auscultation bilaterally.  CARDIAC:  Regular rate and rhythm without murmurs appreciated.  ABDOMEN:  Soft, nontender, nondistended.  Normoactive bowel sounds.  No  palpable organomegaly, masses, or hernias.  RECTAL:  Deferred at the time of colonoscopy.  The patient states that  recent rectal exam and pelvic exams performed by Dr. Ulanda Edison within the past  2-3 months were unremarkable with stool being Hemoccult negative.  EXTREMITIES:  Without clubbing, cyanosis, or edema.  NEUROLOGIC:  Alert and oriented x3.  Grossly nonfocal.   ASSESSMENT AND PLAN:  Anemia with borderline iron deficiency and Hemoccult  negative stool.  Risks, benefits, and alternatives to colonoscopy with  possible biopsy and possible polypectomy discussed with the patient, and she  consents to proceed.  She is scheduled electively.                                   Pricilla Riffle. Dagoberto Ligas., MD, [facility]   MTS/MedQ  DD:  03/15/2006  DT:  03/15/2006  Job #:  [id number]   cc:   Biagio Borg, MD

## 2011-02-03 ENCOUNTER — Other Ambulatory Visit: Payer: Self-pay

## 2011-02-03 MED ORDER — FUROSEMIDE 40 MG PO TABS: 40.0000 mg | ORAL_TABLET | Freq: Two times a day (BID) | ORAL | Status: DC

## 2011-06-09 ENCOUNTER — Ambulatory Visit: Payer: BC Managed Care – PPO | Admitting: Internal Medicine

## 2011-06-09 DIAGNOSIS — Z0289 Encounter for other administrative examinations: Secondary | ICD-10-CM

## 2011-07-06 ENCOUNTER — Telehealth: Payer: Self-pay

## 2011-07-06 NOTE — Telephone Encounter (Signed)
Pt's co worker called requesting pt's Wed appt be moved up because of severe neck pain. After speaking with the pt, she complained of abd pain in the left side that radiated to the lower back. I advised that there were no earlier appt available with any MD but if sxs persisted or worsened she should go to local UC or ER. Pt declined and stated that she will wait for her sch'd appt time.

## 2011-07-08 ENCOUNTER — Encounter: Payer: Self-pay | Admitting: Internal Medicine

## 2011-07-08 ENCOUNTER — Ambulatory Visit (INDEPENDENT_AMBULATORY_CARE_PROVIDER_SITE_OTHER): Payer: BC Managed Care – PPO | Admitting: Internal Medicine

## 2011-07-08 ENCOUNTER — Ambulatory Visit: Payer: BC Managed Care – PPO | Admitting: Internal Medicine

## 2011-07-08 ENCOUNTER — Other Ambulatory Visit (INDEPENDENT_AMBULATORY_CARE_PROVIDER_SITE_OTHER): Payer: BC Managed Care – PPO

## 2011-07-08 ENCOUNTER — Other Ambulatory Visit: Payer: Self-pay | Admitting: Internal Medicine

## 2011-07-08 VITALS — BP 120/72 | HR 69 | Temp 98.4°F | Ht 60.0 in | Wt 151.1 lb

## 2011-07-08 DIAGNOSIS — IMO0001 Reserved for inherently not codable concepts without codable children: Secondary | ICD-10-CM

## 2011-07-08 DIAGNOSIS — R109 Unspecified abdominal pain: Secondary | ICD-10-CM

## 2011-07-08 DIAGNOSIS — N259 Disorder resulting from impaired renal tubular function, unspecified: Secondary | ICD-10-CM

## 2011-07-08 DIAGNOSIS — I10 Essential (primary) hypertension: Secondary | ICD-10-CM

## 2011-07-08 LAB — URINALYSIS, ROUTINE W REFLEX MICROSCOPIC
Bilirubin Urine: NEGATIVE
Ketones, ur: NEGATIVE
Leukocytes, UA: NEGATIVE
Nitrite: NEGATIVE
Specific Gravity, Urine: 1.01 (ref 1.000–1.030)
Total Protein, Urine: 30
Urine Glucose: 500
Urobilinogen, UA: 0.2 (ref 0.0–1.0)
pH: 6 (ref 5.0–8.0)

## 2011-07-08 LAB — BASIC METABOLIC PANEL
BUN: 43 mg/dL — ABNORMAL HIGH (ref 6–23)
CO2: 26 mEq/L (ref 19–32)
Calcium: 8.9 mg/dL (ref 8.4–10.5)
Chloride: 97 mEq/L (ref 96–112)
Creatinine, Ser: 3 mg/dL — ABNORMAL HIGH (ref 0.4–1.2)
GFR: 20.22 mL/min — ABNORMAL LOW (ref >60.00)
Glucose, Bld: 296 mg/dL — ABNORMAL HIGH (ref 70–99)
Potassium: 4.6 mEq/L (ref 3.5–5.1)
Sodium: 133 mEq/L — ABNORMAL LOW (ref 135–145)

## 2011-07-08 LAB — HEPATIC FUNCTION PANEL
ALT: 14 U/L (ref 0–35)
AST: 19 U/L (ref 0–37)
Albumin: 3.5 g/dL (ref 3.5–5.2)
Alkaline Phosphatase: 86 U/L (ref 39–117)
Bilirubin, Direct: 0 mg/dL (ref 0.0–0.3)
Total Bilirubin: 0.3 mg/dL (ref 0.3–1.2)
Total Protein: 7.1 g/dL (ref 6.0–8.3)

## 2011-07-08 LAB — CBC WITH DIFFERENTIAL/PLATELET
Basophils Absolute: 0 10*3/uL (ref 0.0–0.1)
Basophils Relative: 0.5 % (ref 0.0–3.0)
Eosinophils Absolute: 0.1 10*3/uL (ref 0.0–0.7)
Eosinophils Relative: 0.9 % (ref 0.0–5.0)
HCT: 28.8 % — ABNORMAL LOW (ref 36.0–46.0)
Hemoglobin: 9.6 g/dL — ABNORMAL LOW (ref 12.0–15.0)
Lymphocytes Relative: 25.4 % (ref 12.0–46.0)
Lymphs Abs: 1.8 10*3/uL (ref 0.7–4.0)
MCHC: 33.5 g/dL (ref 30.0–36.0)
MCV: 93.8 fl (ref 78.0–100.0)
Monocytes Absolute: 0.4 10*3/uL (ref 0.1–1.0)
Monocytes Relative: 5.5 % (ref 3.0–12.0)
Neutro Abs: 4.9 10*3/uL (ref 1.4–7.7)
Neutrophils Relative %: 67.7 % (ref 43.0–77.0)
Platelets: 111 10*3/uL — ABNORMAL LOW (ref 150.0–400.0)
RBC: 3.07 Mil/uL — ABNORMAL LOW (ref 3.87–5.11)
RDW: 12.5 % (ref 11.5–14.6)
WBC: 7.2 10*3/uL (ref 4.5–10.5)

## 2011-07-08 LAB — LIPASE: Lipase: 13 U/L (ref 11.0–59.0)

## 2011-07-08 LAB — HEMOGLOBIN A1C: Hgb A1c MFr Bld: 15 % — ABNORMAL HIGH (ref 4.6–6.5)

## 2011-07-08 LAB — H. PYLORI ANTIBODY, IGG: H Pylori IgG: NEGATIVE

## 2011-07-08 MED ORDER — PANTOPRAZOLE SODIUM 40 MG PO TBEC: 40.0000 mg | DELAYED_RELEASE_TABLET | Freq: Every day | ORAL | Status: DC

## 2011-07-08 NOTE — Patient Instructions (Signed)
Take all new medications as prescribed Continue all other medications as before Please go to LAB in the Basement for the blood and/or urine tests to be done today You will be contacted regarding the referral for: CT of the abdomen/pelvis (see PCC's now - to be done tomorrow please) Please call the phone number [phone] (the [facility]) for results of testing in 2-3 days;  When calling, simply dial the number, and when prompted enter the MRN number above (the Medical Record Number) and the # key, then the message should start.

## 2011-07-08 NOTE — Telephone Encounter (Signed)
Done in error.

## 2011-07-08 NOTE — Assessment & Plan Note (Addendum)
Moderate ,recurrent x 2 wks, mid and left abdomen with radiation to the left back, without fever, n/v ,bowel change or blood, but worse postprandial - diff includes pancreatitis, renal stone, PUD , and gastroparesis but doubt GB or pyelonpheritis  - will check labs, UA, and CT abd/pelvis, hold on antibx, for PPI trial as well; f/u 1 wk or sooner if needed, consider GI referral

## 2011-07-09 ENCOUNTER — Other Ambulatory Visit: Payer: BC Managed Care – PPO

## 2011-07-09 ENCOUNTER — Ambulatory Visit (INDEPENDENT_AMBULATORY_CARE_PROVIDER_SITE_OTHER)
Admission: RE | Admit: 2011-07-09 | Discharge: 2011-07-09 | Disposition: A | Discharge status: Home / Self Care | Payer: BC Managed Care – PPO | Source: Ambulatory Visit | Attending: Internal Medicine | Admitting: Internal Medicine

## 2011-07-09 DIAGNOSIS — R109 Unspecified abdominal pain: Secondary | ICD-10-CM

## 2011-07-12 ENCOUNTER — Encounter: Payer: Self-pay | Admitting: Internal Medicine

## 2011-07-12 NOTE — Assessment & Plan Note (Signed)
Severe recent uncontrolled, overall by hx and exam, most recent data reviewed with pt, and pt to continue med as is, but will likely need endo f/u which she has not done due to cost concerns she states today  Lab Results  Component Value Date   HGBA1C 15.0* 07/08/2011

## 2011-07-12 NOTE — Progress Notes (Signed)
Subjective:    Patient ID: Carolyn Valenzuela, female    DOB: 1951/07/17, 60 y.o.   MRN: [id number]  HPI Here with acute onset left mid/upper abd pain and flank pain x 2 wks, mild to start, now more constant, mild to mod but persistent -  dull and achy, and not assoc with fever, n/v, constipation/diarrhea, seems worse post prandial, alka selter seemed to help to start, now no longer works, and  Denies urinary symptoms such as dysuria, frequency, urgency,or hematuria.   Pt denies fever, wt loss, night sweats, loss of appetite, or other constitutional symptoms  Pt denies chest pain, increased sob or doe, wheezing, orthopnea, PND, increased LE swelling, palpitations, dizziness or syncope.  Pt denies new neurological symptoms such as new headache, or facial or extremity weakness or numbness   Pt denies polydipsia, polyuria.  Has known marked CKD. Past Medical History  Diagnosis Date  . DIABETES MELLITUS, UNCONTROLLED 05/21/2009  . HYPERLIPIDEMIA 03/30/2007  . ANEMIA-IRON DEFICIENCY 01/25/2008  . HYPERTENSION 01/25/2008  . SINUSITIS- ACUTE-NOS 01/25/2008  . SECONDARY HYPERPARATHYROIDISM 05/21/2009  . RENAL INSUFFICIENCY 02/01/2008  . CERVICAL RADICULOPATHY, LEFT 01/25/2008  . BACK PAIN 05/02/2009  . NUMBNESS 05/21/2009  . Allergic rhinitis, cause unspecified 12/08/2010   No past surgical history on file.  reports that she has never smoked. She does not have any smokeless tobacco history on file. She reports that she does not drink alcohol. Her drug history not on file. family history includes Cancer in her mother. Allergies  Allergen Reactions  . Oxycodone Nausea And Vomiting   Current Outpatient Prescriptions on File Prior to Visit  Medication Sig Dispense Refill  . aspirin 81 MG EC tablet Take 81 mg by mouth daily.        . carvedilol (COREG) 25 MG tablet Take 1 tablet (25 mg total) by mouth 2 (two) times daily.  60 tablet  11  . fexofenadine (ALLEGRA) 180 MG tablet Take 1 tablet (180 mg total) by mouth  daily.  30 tablet  2  . furosemide (LASIX) 40 MG tablet TAKE ONE TABLET BY MOUTH TWICE DAILY  60 tablet  1  . furosemide (LASIX) 40 MG tablet Take 1 tablet (40 mg total) by mouth 2 (two) times daily.  180 tablet  3  . glimepiride (AMARYL) 4 MG tablet Take 1 tablet (4 mg total) by mouth 2 (two) times daily.  180 tablet  3  . glucose blood (ONE TOUCH ULTRA TEST) test strip 1 each. Use as directed two times a day       . insulin glargine (LANTUS SOLOSTAR) 100 UNIT/ML injection Inject 55 Units into the skin daily.  10 mL  11  . ONE TOUCH LANCETS MISC Use as directed two times a day       . simvastatin (ZOCOR) 40 MG tablet Take 40 mg by mouth daily.        . sitaGLIPtan (JANUVIA) 100 MG tablet Take 1 tablet (100 mg total) by mouth daily.  90 tablet  3   Review of Systems Review of Systems  Constitutional: Negative for diaphoresis and unexpected weight change.  HENT: Negative for drooling and tinnitus.   Eyes: Negative for photophobia and visual disturbance.  Respiratory: Negative for choking and stridor.   Gastrointestinal: Negative for vomiting and blood in stool.  Genitourinary: Negative for hematuria and decreased urine volume.  Musculoskeletal: Negative for gait problem.  Skin: Negative for color change and wound.       Objective:   Physical  Exam  BP 120/72  Pulse 69  Temp(Src) 98.4 F (36.9 C) (Oral)  Ht 5' (1.524 m)  Wt 151 lb 2 oz (68.55 kg)  BMI 29.51 kg/m2  SpO2 97% Physical Exam  VS noted Constitutional: Pt appears well-developed and well-nourished.  HENT: Head: Normocephalic.  Right Ear: External ear normal.  Left Ear: External ear normal.  Eyes: Conjunctivae and EOM are normal. Pupils are equal, round, and reactive to light.  Neck: Normal range of motion. Neck supple.  Cardiovascular: Normal rate and regular rhythm.   Pulmonary/Chest: Effort normal and breath sounds normal.  Abd:  Soft, non-distended, + BS, with mild to mod tender to left mid and upper quad tender  without guarding or rebound Neurological: Pt is alert. No cranial nerve deficit.  Skin: Skin is warm. No erythema.  Psychiatric: Pt behavior is normal. Thought content normal. 1+ nervous    Assessment & Plan:

## 2011-07-12 NOTE — Assessment & Plan Note (Signed)
stable overall by hx and exam, most recent data reviewed with pt, and pt to continue medical treatment as before  BP Readings from Last 3 Encounters:  07/08/11 120/72  12/08/10 110/60  11/04/09 130/80

## 2011-07-12 NOTE — Assessment & Plan Note (Signed)
Volume stable - to re-check today as well,  to f/u any worsening symptoms or concerns

## 2011-07-13 ENCOUNTER — Other Ambulatory Visit: Payer: Self-pay | Admitting: Internal Medicine

## 2011-07-13 MED ORDER — INSULIN GLARGINE 100 UNIT/ML ~~LOC~~ SOLN: 55.0000 [IU] | Freq: Every day | SUBCUTANEOUS | Status: DC

## 2011-07-13 MED ORDER — SITAGLIPTIN PHOSPHATE 100 MG PO TABS: 100.0000 mg | ORAL_TABLET | Freq: Every day | ORAL | Status: DC

## 2011-08-20 ENCOUNTER — Other Ambulatory Visit: Payer: Self-pay | Admitting: Internal Medicine

## 2011-12-08 ENCOUNTER — Encounter: Payer: Self-pay | Admitting: Internal Medicine

## 2011-12-08 ENCOUNTER — Ambulatory Visit (INDEPENDENT_AMBULATORY_CARE_PROVIDER_SITE_OTHER): Payer: BC Managed Care – PPO | Admitting: Internal Medicine

## 2011-12-08 VITALS — BP 132/80 | HR 76 | Temp 97.4°F | Wt 144.5 lb

## 2011-12-08 DIAGNOSIS — M76899 Other specified enthesopathies of unspecified lower limb, excluding foot: Secondary | ICD-10-CM

## 2011-12-08 DIAGNOSIS — I10 Essential (primary) hypertension: Secondary | ICD-10-CM

## 2011-12-08 DIAGNOSIS — M545 Low back pain, unspecified: Secondary | ICD-10-CM

## 2011-12-08 DIAGNOSIS — M79609 Pain in unspecified limb: Secondary | ICD-10-CM

## 2011-12-08 DIAGNOSIS — M79605 Pain in left leg: Secondary | ICD-10-CM

## 2011-12-08 DIAGNOSIS — M7072 Other bursitis of hip, left hip: Secondary | ICD-10-CM | POA: Insufficient documentation

## 2011-12-08 MED ORDER — PREDNISONE 10 MG PO TABS: ORAL_TABLET | ORAL | Status: DC

## 2011-12-08 MED ORDER — TRAMADOL HCL 50 MG PO TABS: 50.0000 mg | ORAL_TABLET | Freq: Four times a day (QID) | ORAL | Status: AC | PRN

## 2011-12-08 NOTE — Patient Instructions (Signed)
Take all new medications as prescribed - the pain medication, and the prednisone (for 5 days only) Continue all other medications as before You will be contacted regarding the referral for: left leg ultrasound for blood clot

## 2011-12-09 ENCOUNTER — Encounter (INDEPENDENT_AMBULATORY_CARE_PROVIDER_SITE_OTHER): Payer: BC Managed Care – PPO

## 2011-12-09 ENCOUNTER — Other Ambulatory Visit: Payer: Self-pay | Admitting: Cardiology

## 2011-12-09 DIAGNOSIS — M7989 Other specified soft tissue disorders: Secondary | ICD-10-CM

## 2011-12-09 DIAGNOSIS — I8000 Phlebitis and thrombophlebitis of superficial vessels of unspecified lower extremity: Secondary | ICD-10-CM

## 2011-12-09 DIAGNOSIS — M79609 Pain in unspecified limb: Secondary | ICD-10-CM

## 2011-12-13 ENCOUNTER — Encounter: Payer: Self-pay | Admitting: Internal Medicine

## 2011-12-13 NOTE — Assessment & Plan Note (Addendum)
Incidental, mild, for prednisone asd,  to f/u any worsening symptoms or concerns, consider ortho referral

## 2011-12-13 NOTE — Assessment & Plan Note (Signed)
stable overall by hx and exam, most recent data reviewed with pt, and pt to continue medical treatment as before BP Readings from Last 3 Encounters:  12/08/11 132/80  07/08/11 120/72  12/08/10 110/60

## 2011-12-13 NOTE — Assessment & Plan Note (Signed)
Distal LLE , prob superfic phlebitis, for ASA , pain control, and venous doppler - r/o dvt

## 2011-12-13 NOTE — Progress Notes (Signed)
Subjective:    Patient ID: Carolyn Valenzuela, female    DOB: 1950/11/17, 61 y.o.   MRN: [id number]  HPI  Here to f/u; unfortunately took a fall backwards 2 wks ago to the buttock apparently no significant injury at the time, then 1 wk ago took car trip last several hours there and back with several types of pain, including left lower back pain, intermittent but persistently recurreing, mild , burning like, with radiation to the left buttock and lateral hip area, nothing make better or worse;  Also left lateral hip tenderness, mild, sharp, constant, without radiation, worse to lie on left side, nothing makes better;  Also with left calf area tightness and swelling after the car ride with a palpable new blood vessel it seems to the heel and distal leg area, mod, worse to walk, nothing makes better. Pt denies chest pain, increased sob or doe, wheezing, orthopnea, PND, increased LE swelling, palpitations, dizziness or syncope.  Pt denies fever, wt loss, night sweats, loss of appetite, or other constitutional symptoms Past Medical History  Diagnosis Date  . DIABETES MELLITUS, UNCONTROLLED 05/21/2009  . HYPERLIPIDEMIA 03/30/2007  . ANEMIA-IRON DEFICIENCY 01/25/2008  . HYPERTENSION 01/25/2008  . SINUSITIS- ACUTE-NOS 01/25/2008  . SECONDARY HYPERPARATHYROIDISM 05/21/2009  . RENAL INSUFFICIENCY 02/01/2008  . CERVICAL RADICULOPATHY, LEFT 01/25/2008  . BACK PAIN 05/02/2009  . NUMBNESS 05/21/2009  . Allergic rhinitis, cause unspecified 12/08/2010   No past surgical history on file.  reports that she has never smoked. She does not have any smokeless tobacco history on file. She reports that she does not drink alcohol. Her drug history not on file. family history includes Cancer in her mother. Allergies  Allergen Reactions  . Oxycodone Nausea And Vomiting   Current Outpatient Prescriptions on File Prior to Visit  Medication Sig Dispense Refill  . aspirin 81 MG EC tablet Take 81 mg by mouth daily.        . carvedilol  (COREG) 25 MG tablet Take 1 tablet (25 mg total) by mouth 2 (two) times daily.  60 tablet  11  . fexofenadine (ALLEGRA) 180 MG tablet Take 1 tablet (180 mg total) by mouth daily.  30 tablet  2  . furosemide (LASIX) 40 MG tablet TAKE ONE TABLET BY MOUTH TWICE DAILY  60 tablet  1  . furosemide (LASIX) 40 MG tablet Take 1 tablet (40 mg total) by mouth 2 (two) times daily.  180 tablet  3  . glimepiride (AMARYL) 4 MG tablet Take 1 tablet (4 mg total) by mouth 2 (two) times daily.  180 tablet  3  . glucose blood (ONE TOUCH ULTRA TEST) test strip 1 each. Use as directed two times a day       . insulin glargine (LANTUS SOLOSTAR) 100 UNIT/ML injection Inject 55 Units into the skin daily.  15 mL  11  . ONE TOUCH LANCETS MISC Use as directed two times a day       . pantoprazole (PROTONIX) 40 MG tablet Take 1 tablet (40 mg total) by mouth daily.  30 tablet  11  . simvastatin (ZOCOR) 40 MG tablet TAKE ONE TABLET BY MOUTH EVERY DAY  90 tablet  1  . sitaGLIPtin (JANUVIA) 100 MG tablet Take 1 tablet (100 mg total) by mouth daily.  90 tablet  3   Review of Systems Review of Systems  Constitutional: Negative for diaphoresis and unexpected weight change.  Eyes: Negative for photophobia and visual disturbance.  Respiratory: Negative for choking and stridor.  Gastrointestinal: Negative for vomiting and blood in stool.  Genitourinary: Negative for hematuria and decreased urine volume.  Musculoskeletal: Negative for gait problem.  Skin: Negative for color change and wound.  Neurological: Negative for tremors and numbness.  Psychiatric/Behavioral: Negative for decreased concentration. The patient is not hyperactive.       Objective:   Physical Exam BP 132/80  Pulse 76  Temp(Src) 97.4 F (36.3 C) (Oral)  Wt 144 lb 8 oz (65.545 kg)  SpO2 97% Physical Exam  VS noted, not ill appearing Constitutional: Pt appears well-developed and well-nourished.  HENT: Head: Normocephalic.  Eyes: Conjunctivae and EOM are  normal. Pupils are equal, round, and reactive to light.  Neck: Normal range of motion. Neck supple.  Cardiovascular: Normal rate and regular rhythm.   Pulmonary/Chest: Effort normal and breath sounds normal.  Abd:  Soft, NT, non-distended, + BS Neurological: Pt is alert. No cranial nerve deficit. motor.sens.dtr intact to LE's + tender over left lateral greater trochanter LLE trace to 1+ edema below knee with palpable vessel I suspect superfic phlebitis, tender Skin: Skin is warm. No erythema.  Psychiatric: Pt behavior is normal. Thought content normal.     Assessment & Plan:

## 2011-12-13 NOTE — Assessment & Plan Note (Signed)
C/w possible lumbar radiculitis, for pain control predpack asd,  to f/u any worsening symptoms or concerns

## 2011-12-14 ENCOUNTER — Encounter: Payer: Self-pay | Admitting: Internal Medicine

## 2011-12-19 ENCOUNTER — Other Ambulatory Visit: Payer: Self-pay | Admitting: Internal Medicine

## 2012-01-21 ENCOUNTER — Other Ambulatory Visit: Payer: Self-pay | Admitting: Internal Medicine

## 2012-02-29 ENCOUNTER — Other Ambulatory Visit: Payer: Self-pay | Admitting: Internal Medicine

## 2012-04-05 ENCOUNTER — Encounter: Payer: Self-pay | Admitting: Vascular Surgery

## 2012-04-06 ENCOUNTER — Ambulatory Visit (INDEPENDENT_AMBULATORY_CARE_PROVIDER_SITE_OTHER): Payer: BC Managed Care – PPO | Admitting: Vascular Surgery

## 2012-04-06 ENCOUNTER — Encounter: Payer: Self-pay | Admitting: Vascular Surgery

## 2012-04-06 VITALS — BP 176/61 | HR 91 | Resp 18 | Ht 60.0 in | Wt 143.0 lb

## 2012-04-06 DIAGNOSIS — N186 End stage renal disease: Secondary | ICD-10-CM

## 2012-04-06 NOTE — Progress Notes (Signed)
[facility] [place] [facility]  Patient name: Carolyn Valenzuela MRN: [id number] DOB: 01-22-1951 Sex: female  REASON FOR VISIT: evaluate for new hemodialysis access.  HPI: Carolyn Valenzuela is a 61 y.o. female who had a left radiocephalic fistula placed approximately 2 years ago however this has been chronically occluded. She was sent over for vein mapping in to evaluate her for new hemodialysis access. She is right-handed. She's had no recent uremic symptoms. Specifically she denies nausea, vomiting, fatigue, anorexia, or palpitations.  In the office today, she refused to have her vein mapping performed. I spoke her in the office and explained that I needed this information on to make a decision about her access. However because of school and other commitments at this point she is not willing to consider placement of access.   REVIEW OF SYSTEMS: Valu.Nieves ] denotes positive finding; [  ] denotes negative finding  CARDIOVASCULAR:  [ ]  chest pain   [ ]  dyspnea on exertion    CONSTITUTIONAL:  [ ]  fever   [ ]  chills  PHYSICAL EXAM: Filed Vitals:   04/06/12 1543  BP: 176/61  Pulse: 91  Resp: 18  Height: 5' (1.524 m)  Weight: 143 lb (64.864 kg)   Body mass index is 27.93 kg/(m^2). GENERAL: The patient is a well-nourished female, in no acute distress. The vital signs are documented above. CARDIOVASCULAR: There is a regular rate and rhythm  PULMONARY: There is good air exchange bilaterally without wheezing or rales. He has a palpable radial pulse bilaterally.  MEDICAL ISSUES: I have asked her to call is when she would like to re\re schedule her appointment for vein mapping and then we can discuss her next best option for hemodialysis access.  [facility] [place] [facility]: [phone]

## 2012-04-07 ENCOUNTER — Encounter: Payer: Self-pay | Admitting: Vascular Surgery

## 2012-04-08 ENCOUNTER — Other Ambulatory Visit (HOSPITAL_COMMUNITY): Payer: Self-pay | Admitting: *Deleted

## 2012-04-11 ENCOUNTER — Encounter (HOSPITAL_COMMUNITY)
Admission: RE | Admit: 2012-04-11 | Discharge: 2012-04-11 | Disposition: A | Discharge status: Home / Self Care | Payer: BC Managed Care – PPO | Source: Ambulatory Visit | Attending: Nephrology | Admitting: Nephrology

## 2012-04-11 DIAGNOSIS — N184 Chronic kidney disease, stage 4 (severe): Secondary | ICD-10-CM | POA: Insufficient documentation

## 2012-04-11 DIAGNOSIS — D638 Anemia in other chronic diseases classified elsewhere: Secondary | ICD-10-CM | POA: Insufficient documentation

## 2012-04-11 LAB — POCT HEMOGLOBIN-HEMACUE: Hemoglobin: 8.9 g/dL — ABNORMAL LOW (ref 12.0–15.0)

## 2012-04-11 MED ORDER — EPOETIN ALFA 20000 UNIT/ML IJ SOLN
20000.0000 [IU] | INTRAMUSCULAR | Status: DC
  Administered 2012-04-11: 20000 [IU] via SUBCUTANEOUS
  Filled 2012-04-11: qty 1

## 2012-04-11 MED ORDER — SODIUM CHLORIDE 0.9 % IV SOLN
INTRAVENOUS | Status: DC
  Administered 2012-04-11: 10:00:00 via INTRAVENOUS

## 2012-04-11 MED ORDER — FERUMOXYTOL INJECTION 510 MG/17 ML
510.0000 mg | INTRAVENOUS | Status: DC
  Administered 2012-04-11: 510 mg via INTRAVENOUS
  Filled 2012-04-11: qty 17

## 2012-04-22 ENCOUNTER — Encounter (HOSPITAL_COMMUNITY): Payer: BC Managed Care – PPO

## 2012-04-25 ENCOUNTER — Encounter (HOSPITAL_COMMUNITY): Payer: BC Managed Care – PPO

## 2012-05-05 ENCOUNTER — Encounter (HOSPITAL_COMMUNITY)
Admission: RE | Admit: 2012-05-05 | Discharge: 2012-05-05 | Disposition: A | Discharge status: Home / Self Care | Payer: BC Managed Care – PPO | Source: Ambulatory Visit | Attending: Nephrology | Admitting: Nephrology

## 2012-05-05 DIAGNOSIS — D638 Anemia in other chronic diseases classified elsewhere: Secondary | ICD-10-CM | POA: Insufficient documentation

## 2012-05-05 DIAGNOSIS — N184 Chronic kidney disease, stage 4 (severe): Secondary | ICD-10-CM | POA: Insufficient documentation

## 2012-05-05 LAB — POCT HEMOGLOBIN-HEMACUE: Hemoglobin: 8.9 g/dL — ABNORMAL LOW (ref 12.0–15.0)

## 2012-05-05 MED ORDER — FERUMOXYTOL INJECTION 510 MG/17 ML
510.0000 mg | INTRAVENOUS | Status: DC
  Administered 2012-05-05: 510 mg via INTRAVENOUS

## 2012-05-05 MED ORDER — EPOETIN ALFA 20000 UNIT/ML IJ SOLN
20000.0000 [IU] | INTRAMUSCULAR | Status: DC
  Administered 2012-05-05: 20000 [IU] via SUBCUTANEOUS

## 2012-05-05 MED ORDER — FERUMOXYTOL INJECTION 510 MG/17 ML
INTRAVENOUS | Status: AC
  Administered 2012-05-05: 510 mg via INTRAVENOUS
  Filled 2012-05-05: qty 17

## 2012-05-05 MED ORDER — EPOETIN ALFA 20000 UNIT/ML IJ SOLN
INTRAMUSCULAR | Status: AC
  Administered 2012-05-05: 20000 [IU] via SUBCUTANEOUS
  Filled 2012-05-05: qty 1

## 2012-05-05 MED ORDER — SODIUM CHLORIDE 0.9 % IV SOLN
INTRAVENOUS | Status: DC
  Administered 2012-05-05: 250 mL via INTRAVENOUS

## 2012-05-09 ENCOUNTER — Encounter: Payer: Self-pay | Admitting: Endocrinology

## 2012-05-09 ENCOUNTER — Ambulatory Visit (INDEPENDENT_AMBULATORY_CARE_PROVIDER_SITE_OTHER): Payer: BC Managed Care – PPO | Admitting: Endocrinology

## 2012-05-09 VITALS — BP 134/68 | HR 73 | Temp 98.4°F | Resp 16 | Wt 140.0 lb

## 2012-05-09 DIAGNOSIS — I10 Essential (primary) hypertension: Secondary | ICD-10-CM

## 2012-05-09 MED ORDER — SIMVASTATIN 20 MG PO TABS: 20.0000 mg | ORAL_TABLET | Freq: Every day | ORAL | Status: DC

## 2012-05-09 MED ORDER — AMLODIPINE BESYLATE 2.5 MG PO TABS: 2.5000 mg | ORAL_TABLET | Freq: Every day | ORAL | Status: DC

## 2012-05-09 NOTE — Progress Notes (Signed)
Subjective:    Patient ID: Carolyn Valenzuela Comment, female    DOB: 1950/09/15, 61 y.o.   MRN: [id number]  HPI Pt says she takes bp meds as rx'ed.  She was noted at work today to have bp of 150/100.  She does not know why it was high today.  She has few days of a slight headache, throughout the head.  No assoc visual loss.   Past Medical History  Diagnosis Date  . DIABETES MELLITUS, UNCONTROLLED 05/21/2009  . HYPERLIPIDEMIA 03/30/2007  . ANEMIA-IRON DEFICIENCY 01/25/2008  . HYPERTENSION 01/25/2008  . SINUSITIS- ACUTE-NOS 01/25/2008  . SECONDARY HYPERPARATHYROIDISM 05/21/2009  . RENAL INSUFFICIENCY 02/01/2008  . CERVICAL RADICULOPATHY, LEFT 01/25/2008  . BACK PAIN 05/02/2009  . NUMBNESS 05/21/2009  . Allergic rhinitis, cause unspecified 12/08/2010    Past Surgical History  Procedure Date  . Av fistula placement   . Eye surgery     History   Social History  . Marital Status: Single    Spouse Name: N/A    Number of Children: N/A  . Years of Education: N/A   Occupational History  . [employer] History Main Topics  . Smoking status: Never Smoker   . Smokeless tobacco: Never Used  . Alcohol Use: No  . Drug Use: No  . Sexually Active: Not on file   Other Topics Concern  . Not on file   Social History Narrative  . No narrative on file    Current Outpatient Prescriptions on File Prior to Visit  Medication Sig Dispense Refill  . aspirin 81 MG EC tablet Take 81 mg by mouth daily.        . carvedilol (COREG) 25 MG tablet TAKE ONE TABLET BY MOUTH TWICE DAILY  60 tablet  11  . furosemide (LASIX) 40 MG tablet TAKE ONE TABLET BY MOUTH TWICE DAILY  180 tablet  2  . glimepiride (AMARYL) 4 MG tablet TAKE ONE TABLET BY MOUTH TWICE DAILY  180 tablet  3  . glucose blood (ONE TOUCH ULTRA TEST) test strip 1 each. Use as directed two times a day       . insulin glargine (LANTUS SOLOSTAR) 100 UNIT/ML injection Inject 55 Units into the skin daily.  15 mL  11  . JANUVIA 100 MG  tablet TAKE ONE TABLET BY MOUTH EVERY DAY  90 each  1  . ONE TOUCH LANCETS MISC Use as directed two times a day       . pantoprazole (PROTONIX) 40 MG tablet Take 1 tablet (40 mg total) by mouth daily.  30 tablet  11  . predniSONE (DELTASONE) 10 MG tablet 1 tab by mouth per day for 5 days  5 tablet  0  . sitaGLIPtin (JANUVIA) 100 MG tablet Take 1 tablet (100 mg total) by mouth daily.  90 tablet  3  . amLODipine (NORVASC) 2.5 MG tablet Take 1 tablet (2.5 mg total) by mouth daily.  30 tablet  11  . fexofenadine (ALLEGRA) 180 MG tablet Take 1 tablet (180 mg total) by mouth daily.  30 tablet  2  . simvastatin (ZOCOR) 20 MG tablet Take 1 tablet (20 mg total) by mouth at bedtime.  90 tablet  3    Allergies  Allergen Reactions  . Oxycodone Nausea And Vomiting    Family History  Problem Relation Age of Onset  . Cancer Mother     Pancreatic    BP 134/68  Pulse 73  Temp 98.4 F (36.9  C) (Oral)  Resp 16  Wt 140 lb (63.504 kg)  SpO2 97%  Review of Systems Denies chest pain and sob.      Objective:   Physical Exam VITAL SIGNS:  See vs page GENERAL: no distress LUNGS:  Clear to auscultation HEART:  Regular rate and rhythm without murmurs noted. Normal S1,S2.  Ext: no edema      Assessment & Plan:  HTN, needs increased rx Dyslipidemia.  There is an interaction between zocor and norvasc Headache, ? Related to HTN

## 2012-05-09 NOTE — Patient Instructions (Addendum)
i have sent a prescription to your pharmacy, for an additional blood pressure medication. Please have your blood pressure rechecked when you next see dr Moshe Cipro. Reduce the simvastatin to 20 mg daily Please call dr Jenny Reichmann if the headache keeps up.

## 2012-05-19 ENCOUNTER — Encounter (HOSPITAL_COMMUNITY)
Admission: RE | Admit: 2012-05-19 | Discharge: 2012-05-19 | Disposition: A | Discharge status: Home / Self Care | Payer: BC Managed Care – PPO | Source: Ambulatory Visit | Attending: Nephrology | Admitting: Nephrology

## 2012-05-19 LAB — FERRITIN: Ferritin: 937 ng/mL — ABNORMAL HIGH (ref 10–291)

## 2012-05-19 LAB — RENAL FUNCTION PANEL
Albumin: 3.5 g/dL (ref 3.5–5.2)
BUN: 57 mg/dL — ABNORMAL HIGH (ref 6–23)
CO2: 25 mEq/L (ref 19–32)
Calcium: 9.1 mg/dL (ref 8.4–10.5)
Chloride: 93 mEq/L — ABNORMAL LOW (ref 96–112)
Creatinine, Ser: 3.8 mg/dL — ABNORMAL HIGH (ref 0.50–1.10)
GFR calc Af Amer: 14 mL/min — ABNORMAL LOW (ref >90)
GFR calc non Af Amer: 12 mL/min — ABNORMAL LOW (ref >90)
Glucose, Bld: 326 mg/dL — ABNORMAL HIGH (ref 70–99)
Phosphorus: 4 mg/dL (ref 2.3–4.6)
Potassium: 3.7 mEq/L (ref 3.5–5.1)
Sodium: 132 mEq/L — ABNORMAL LOW (ref 135–145)

## 2012-05-19 LAB — POCT HEMOGLOBIN-HEMACUE: Hemoglobin: 8.7 g/dL — ABNORMAL LOW (ref 12.0–15.0)

## 2012-05-19 MED ORDER — EPOETIN ALFA 20000 UNIT/ML IJ SOLN
INTRAMUSCULAR | Status: AC
  Filled 2012-05-19: qty 1

## 2012-05-19 MED ORDER — EPOETIN ALFA 20000 UNIT/ML IJ SOLN
20000.0000 [IU] | INTRAMUSCULAR | Status: DC
  Administered 2012-05-19: 20000 [IU] via SUBCUTANEOUS

## 2012-05-20 LAB — IRON AND TIBC
Iron: 80 ug/dL (ref 42–135)
Saturation Ratios: 34 % (ref 20–55)
TIBC: 236 ug/dL — ABNORMAL LOW (ref 250–470)
UIBC: 156 ug/dL (ref 125–400)

## 2012-06-02 ENCOUNTER — Encounter (HOSPITAL_COMMUNITY)
Admission: RE | Admit: 2012-06-02 | Discharge: 2012-06-02 | Disposition: A | Discharge status: Home / Self Care | Payer: BC Managed Care – PPO | Source: Ambulatory Visit | Attending: Nephrology | Admitting: Nephrology

## 2012-06-02 DIAGNOSIS — N184 Chronic kidney disease, stage 4 (severe): Secondary | ICD-10-CM | POA: Insufficient documentation

## 2012-06-02 DIAGNOSIS — D638 Anemia in other chronic diseases classified elsewhere: Secondary | ICD-10-CM | POA: Insufficient documentation

## 2012-06-02 LAB — POCT HEMOGLOBIN-HEMACUE: Hemoglobin: 8.4 g/dL — ABNORMAL LOW (ref 12.0–15.0)

## 2012-06-02 MED ORDER — EPOETIN ALFA 20000 UNIT/ML IJ SOLN
20000.0000 [IU] | INTRAMUSCULAR | Status: DC
  Administered 2012-06-02: 20000 [IU] via SUBCUTANEOUS

## 2012-06-02 MED ORDER — EPOETIN ALFA 20000 UNIT/ML IJ SOLN
INTRAMUSCULAR | Status: AC
  Administered 2012-06-02: 20000 [IU] via SUBCUTANEOUS
  Filled 2012-06-02: qty 1

## 2012-06-08 ENCOUNTER — Other Ambulatory Visit (INDEPENDENT_AMBULATORY_CARE_PROVIDER_SITE_OTHER): Payer: BC Managed Care – PPO

## 2012-06-08 ENCOUNTER — Ambulatory Visit (INDEPENDENT_AMBULATORY_CARE_PROVIDER_SITE_OTHER): Payer: BC Managed Care – PPO | Admitting: Internal Medicine

## 2012-06-08 ENCOUNTER — Encounter: Payer: Self-pay | Admitting: Internal Medicine

## 2012-06-08 VITALS — BP 132/78 | HR 82 | Temp 97.3°F | Ht 62.0 in | Wt 142.2 lb

## 2012-06-08 DIAGNOSIS — Z0000 Encounter for general adult medical examination without abnormal findings: Secondary | ICD-10-CM

## 2012-06-08 DIAGNOSIS — IMO0001 Reserved for inherently not codable concepts without codable children: Secondary | ICD-10-CM

## 2012-06-08 DIAGNOSIS — N186 End stage renal disease: Secondary | ICD-10-CM

## 2012-06-08 DIAGNOSIS — I10 Essential (primary) hypertension: Secondary | ICD-10-CM

## 2012-06-08 LAB — BASIC METABOLIC PANEL
BUN: 57 mg/dL — ABNORMAL HIGH (ref 6–23)
CO2: 26 mEq/L (ref 19–32)
Calcium: 9.3 mg/dL (ref 8.4–10.5)
Chloride: 101 mEq/L (ref 96–112)
Creatinine, Ser: 5 mg/dL (ref 0.4–1.2)
GFR: 11.43 mL/min — CL (ref >60.00)
Glucose, Bld: 127 mg/dL — ABNORMAL HIGH (ref 70–99)
Potassium: 4.1 mEq/L (ref 3.5–5.1)
Sodium: 138 mEq/L (ref 135–145)

## 2012-06-08 LAB — TSH: TSH: 1.68 u[IU]/mL (ref 0.35–5.50)

## 2012-06-08 LAB — HEPATIC FUNCTION PANEL
ALT: 11 U/L (ref 0–35)
AST: 17 U/L (ref 0–37)
Albumin: 3.6 g/dL (ref 3.5–5.2)
Alkaline Phosphatase: 66 U/L (ref 39–117)
Bilirubin, Direct: 0.1 mg/dL (ref 0.0–0.3)
Total Bilirubin: 0.5 mg/dL (ref 0.3–1.2)
Total Protein: 7.5 g/dL (ref 6.0–8.3)

## 2012-06-08 LAB — HEMOGLOBIN A1C: Hgb A1c MFr Bld: 11.1 % — ABNORMAL HIGH (ref 4.6–6.5)

## 2012-06-08 LAB — LIPID PANEL
Cholesterol: 197 mg/dL (ref 0–200)
HDL: 38 mg/dL — ABNORMAL LOW (ref >39.00)
Total CHOL/HDL Ratio: 5
Triglycerides: 218 mg/dL — ABNORMAL HIGH (ref 0.0–149.0)
VLDL: 43.6 mg/dL — ABNORMAL HIGH (ref 0.0–40.0)

## 2012-06-08 MED ORDER — AMLODIPINE BESYLATE 2.5 MG PO TABS: 2.5000 mg | ORAL_TABLET | Freq: Two times a day (BID) | ORAL | Status: DC

## 2012-06-08 NOTE — Patient Instructions (Addendum)
OK to take tylenol ES as needed Please do not take extra aspirin as this can affect your kidneys OK to increase the amlodipine to 2.5 mg twice per day Continue all other medications as before Please have the pharmacy call with any refills you may need. Please go to LAB in the Basement for the blood and/or urine tests to be done today You will be contacted by phone if any changes need to be made immediately.  Otherwise, you will receive a letter about your results with an explanation. Please remember to sign up for My Chart at your earliest convenience, as this will be important to you in the future with finding out test results. Please keep your appointments with your specialists as you have planned - renal Please return in 6 months, or sooner if needed

## 2012-06-08 NOTE — Progress Notes (Signed)
Subjective:    Patient ID: Carolyn Valenzuela, female    DOB: 19-Jun-1951, 61 y.o.   MRN: [id number]  HPI  Here for wellness and f/u;  Overall doing ok;  Pt denies CP, worsening SOB, DOE, wheezing, orthopnea, PND, worsening LE edema, palpitations, dizziness or syncope.  Pt denies neurological change such as new facial or extremity weakness.  Pt denies polydipsia, polyuria, or low sugar symptoms. Pt states overall good compliance with treatment and medications, good tolerability, and trying to follow lower cholesterol diet.  Pt denies worsening depressive symptoms, suicidal ideation or panic. No fever, wt loss, night sweats, loss of appetite, or other constitutional symptoms.  Pt states good ability with ADL's, low fall risk, home safety reviewed and adequate, no significant changes in hearing or vision, and occasionally active with exercise.  Does have recent several throbbing headaches without n/v, blurred vision., better with "a few extra asa" Past Medical History  Diagnosis Date  . DIABETES MELLITUS, UNCONTROLLED 05/21/2009  . HYPERLIPIDEMIA 03/30/2007  . ANEMIA-IRON DEFICIENCY 01/25/2008  . HYPERTENSION 01/25/2008  . SINUSITIS- ACUTE-NOS 01/25/2008  . SECONDARY HYPERPARATHYROIDISM 05/21/2009  . RENAL INSUFFICIENCY 02/01/2008  . CERVICAL RADICULOPATHY, LEFT 01/25/2008  . BACK PAIN 05/02/2009  . NUMBNESS 05/21/2009  . Allergic rhinitis, cause unspecified 12/08/2010   Past Surgical History  Procedure Date  . Av fistula placement   . Eye surgery     reports that she has never smoked. She has never used smokeless tobacco. She reports that she does not drink alcohol or use illicit drugs. family history includes Cancer in her mother. Allergies  Allergen Reactions  . Oxycodone Nausea And Vomiting   Current Outpatient Prescriptions on File Prior to Visit  Medication Sig Dispense Refill  . aspirin 81 MG EC tablet Take 81 mg by mouth daily.        . carvedilol (COREG) 25 MG tablet TAKE ONE TABLET BY MOUTH  TWICE DAILY  60 tablet  11  . furosemide (LASIX) 40 MG tablet TAKE ONE TABLET BY MOUTH TWICE DAILY  180 tablet  2  . glimepiride (AMARYL) 4 MG tablet TAKE ONE TABLET BY MOUTH TWICE DAILY  180 tablet  3  . glucose blood (ONE TOUCH ULTRA TEST) test strip 1 each. Use as directed two times a day       . insulin glargine (LANTUS SOLOSTAR) 100 UNIT/ML injection Inject 55 Units into the skin daily.  15 mL  11  . JANUVIA 100 MG tablet TAKE ONE TABLET BY MOUTH EVERY DAY  90 each  1  . ONE TOUCH LANCETS MISC Use as directed two times a day       . pantoprazole (PROTONIX) 40 MG tablet Take 1 tablet (40 mg total) by mouth daily.  30 tablet  11  . predniSONE (DELTASONE) 10 MG tablet 1 tab by mouth per day for 5 days  5 tablet  0  . simvastatin (ZOCOR) 20 MG tablet Take 1 tablet (20 mg total) by mouth at bedtime.  90 tablet  3  . sitaGLIPtin (JANUVIA) 100 MG tablet Take 1 tablet (100 mg total) by mouth daily.  90 tablet  3  . DISCONTD: amLODipine (NORVASC) 2.5 MG tablet Take 1 tablet (2.5 mg total) by mouth daily.  30 tablet  11  . fexofenadine (ALLEGRA) 180 MG tablet Take 1 tablet (180 mg total) by mouth daily.  30 tablet  2   Review of Systems  Review of Systems  Constitutional: Negative for diaphoresis, activity change, appetite change  and unexpected weight change.  HENT: Negative for hearing loss, ear pain, facial swelling, mouth sores and neck stiffness.   Eyes: Negative for pain, redness and visual disturbance.  Respiratory: Negative for shortness of breath and wheezing.   Cardiovascular: Negative for chest pain and palpitations.  Gastrointestinal: Negative for diarrhea, blood in stool, abdominal distention and rectal pain.  Genitourinary: Negative for hematuria, flank pain and decreased urine volume.  Musculoskeletal: Negative for myalgias and joint swelling.  Skin: Negative for color change and wound.  Neurological: Negative for syncope and numbness.  Hematological: Negative for adenopathy.    Psychiatric/Behavioral: Negative for hallucinations, self-injury, decreased concentration and agitation.     Objective:   Physical Exam BP 132/78  Pulse 82  Temp 97.3 F (36.3 C) (Oral)  Ht 5\' 2"  (1.575 m)  Wt 142 lb 4 oz (64.524 kg)  BMI 26.02 kg/m2  SpO2 98% Physical Exam  VS noted Constitutional: Pt is oriented to person, place, and time. Appears well-developed and well-nourished.  HENT:  Head: Normocephalic and atraumatic.  Right Ear: External ear normal.  Left Ear: External ear normal.  Nose: Nose normal.  Mouth/Throat: Oropharynx is clear and moist.  Eyes: Conjunctivae and EOM are normal. Pupils are equal, round, and reactive to light.  Neck: Normal range of motion. Neck supple. No JVD present. No tracheal deviation present.  Cardiovascular: Normal rate, regular rhythm, normal heart sounds and intact distal pulses.   Pulmonary/Chest: Effort normal and breath sounds normal.  Abdominal: Soft. Bowel sounds are normal. There is no tenderness.  Musculoskeletal: Normal range of motion. Exhibits no edema.  Lymphadenopathy:  Has no cervical adenopathy.  Neurological: Pt is alert and oriented to person, place, and time. Pt has normal reflexes. No cranial nerve deficit.  Motor/dtr/gait intact  Skin: Skin is warm and dry. No rash noted.  Psychiatric:  Has  normal mood and affect. Behavior is normal. 1+ nervous    Assessment & Plan:

## 2012-06-08 NOTE — Assessment & Plan Note (Signed)
[facility] for increase amlodipne to 2.5 bid,Continue all other medications as before

## 2012-06-08 NOTE — Assessment & Plan Note (Signed)
?   Control - stable overall by hx and exam, most recent data reviewed with pt, and pt to continue medical treatment as before. But may need further med if remains uncontrolled Lab Results  Component Value Date   HGBA1C 15.0* 07/08/2011

## 2012-06-08 NOTE — Assessment & Plan Note (Signed)

## 2012-06-08 NOTE — Assessment & Plan Note (Signed)
To avoid nsaid use, f/u renal as planned

## 2012-06-09 ENCOUNTER — Other Ambulatory Visit: Payer: Self-pay | Admitting: Internal Medicine

## 2012-06-09 ENCOUNTER — Encounter: Payer: Self-pay | Admitting: Internal Medicine

## 2012-06-09 DIAGNOSIS — F22 Delusional disorders: Secondary | ICD-10-CM

## 2012-06-09 DIAGNOSIS — IMO0001 Reserved for inherently not codable concepts without codable children: Secondary | ICD-10-CM

## 2012-06-09 LAB — LDL CHOLESTEROL, DIRECT: Direct LDL: 116.6 mg/dL

## 2012-06-13 ENCOUNTER — Other Ambulatory Visit (HOSPITAL_COMMUNITY): Payer: Self-pay | Admitting: *Deleted

## 2012-06-17 ENCOUNTER — Encounter (HOSPITAL_COMMUNITY)
Admission: RE | Admit: 2012-06-17 | Discharge: 2012-06-17 | Disposition: A | Discharge status: Home / Self Care | Payer: BC Managed Care – PPO | Source: Ambulatory Visit | Attending: Nephrology | Admitting: Nephrology

## 2012-06-17 LAB — POCT HEMOGLOBIN-HEMACUE: Hemoglobin: 8.3 g/dL — ABNORMAL LOW (ref 12.0–15.0)

## 2012-06-17 LAB — RENAL FUNCTION PANEL
Albumin: 3.6 g/dL (ref 3.5–5.2)
BUN: 48 mg/dL — ABNORMAL HIGH (ref 6–23)
CO2: 23 mEq/L (ref 19–32)
Calcium: 9.2 mg/dL (ref 8.4–10.5)
Chloride: 102 mEq/L (ref 96–112)
Creatinine, Ser: 4.45 mg/dL — ABNORMAL HIGH (ref 0.50–1.10)
GFR calc Af Amer: 11 mL/min — ABNORMAL LOW (ref >90)
GFR calc non Af Amer: 10 mL/min — ABNORMAL LOW (ref >90)
Glucose, Bld: 224 mg/dL — ABNORMAL HIGH (ref 70–99)
Phosphorus: 4.7 mg/dL — ABNORMAL HIGH (ref 2.3–4.6)
Potassium: 4.5 mEq/L (ref 3.5–5.1)
Sodium: 138 mEq/L (ref 135–145)

## 2012-06-17 LAB — FERRITIN: Ferritin: 569 ng/mL — ABNORMAL HIGH (ref 10–291)

## 2012-06-17 LAB — IRON AND TIBC
Iron: 66 ug/dL (ref 42–135)
Saturation Ratios: 25 % (ref 20–55)
TIBC: 259 ug/dL (ref 250–470)
UIBC: 193 ug/dL (ref 125–400)

## 2012-06-17 MED ORDER — EPOETIN ALFA 20000 UNIT/ML IJ SOLN: 20000.0000 [IU] | INTRAMUSCULAR | Status: DC

## 2012-06-17 MED ORDER — EPOETIN ALFA 20000 UNIT/ML IJ SOLN
INTRAMUSCULAR | Status: AC
  Administered 2012-06-17: 20000 [IU] via SUBCUTANEOUS
  Filled 2012-06-17: qty 1

## 2012-06-20 LAB — PTH, INTACT AND CALCIUM
Calcium, Total (PTH): 8.8 mg/dL (ref 8.4–10.5)
PTH: 252.9 pg/mL — ABNORMAL HIGH (ref 14.0–72.0)

## 2012-06-24 ENCOUNTER — Other Ambulatory Visit (HOSPITAL_COMMUNITY): Payer: Self-pay | Admitting: *Deleted

## 2012-06-24 ENCOUNTER — Encounter (HOSPITAL_COMMUNITY)
Admission: RE | Admit: 2012-06-24 | Discharge: 2012-06-24 | Disposition: A | Discharge status: Home / Self Care | Payer: BC Managed Care – PPO | Source: Ambulatory Visit | Attending: Nephrology | Admitting: Nephrology

## 2012-06-24 LAB — POCT HEMOGLOBIN-HEMACUE: Hemoglobin: 9 g/dL — ABNORMAL LOW (ref 12.0–15.0)

## 2012-06-24 MED ORDER — EPOETIN ALFA 20000 UNIT/ML IJ SOLN
INTRAMUSCULAR | Status: AC
  Administered 2012-06-24: 20000 [IU] via SUBCUTANEOUS
  Filled 2012-06-24: qty 1

## 2012-06-24 MED ORDER — EPOETIN ALFA 20000 UNIT/ML IJ SOLN
20000.0000 [IU] | INTRAMUSCULAR | Status: DC
  Administered 2012-06-24: 20000 [IU] via SUBCUTANEOUS

## 2012-06-30 ENCOUNTER — Encounter (HOSPITAL_COMMUNITY)
Admission: RE | Admit: 2012-06-30 | Discharge: 2012-06-30 | Disposition: A | Discharge status: Home / Self Care | Payer: BC Managed Care – PPO | Source: Ambulatory Visit | Attending: Nephrology | Admitting: Nephrology

## 2012-06-30 LAB — POCT HEMOGLOBIN-HEMACUE: Hemoglobin: 9.6 g/dL — ABNORMAL LOW (ref 12.0–15.0)

## 2012-06-30 MED ORDER — FERUMOXYTOL INJECTION 510 MG/17 ML
INTRAVENOUS | Status: AC
  Administered 2012-06-30: 510 mg via INTRAVENOUS
  Filled 2012-06-30: qty 17

## 2012-06-30 MED ORDER — EPOETIN ALFA 20000 UNIT/ML IJ SOLN
INTRAMUSCULAR | Status: AC
  Administered 2012-06-30: 20000 [IU] via SUBCUTANEOUS
  Filled 2012-06-30: qty 1

## 2012-06-30 MED ORDER — SODIUM CHLORIDE 0.9 % IV SOLN
INTRAVENOUS | Status: DC
  Administered 2012-06-30: 15:00:00 via INTRAVENOUS

## 2012-06-30 MED ORDER — EPOETIN ALFA 20000 UNIT/ML IJ SOLN
20000.0000 [IU] | INTRAMUSCULAR | Status: DC
  Administered 2012-06-30: 20000 [IU] via SUBCUTANEOUS

## 2012-06-30 MED ORDER — FERUMOXYTOL INJECTION 510 MG/17 ML
510.0000 mg | INTRAVENOUS | Status: DC
  Administered 2012-06-30: 510 mg via INTRAVENOUS

## 2012-07-08 ENCOUNTER — Encounter (HOSPITAL_COMMUNITY)
Admission: RE | Admit: 2012-07-08 | Discharge: 2012-07-08 | Disposition: A | Discharge status: Home / Self Care | Payer: BC Managed Care – PPO | Source: Ambulatory Visit | Attending: Nephrology | Admitting: Nephrology

## 2012-07-08 DIAGNOSIS — N184 Chronic kidney disease, stage 4 (severe): Secondary | ICD-10-CM | POA: Insufficient documentation

## 2012-07-08 DIAGNOSIS — D638 Anemia in other chronic diseases classified elsewhere: Secondary | ICD-10-CM | POA: Insufficient documentation

## 2012-07-08 LAB — POCT HEMOGLOBIN-HEMACUE: Hemoglobin: 10.3 g/dL — ABNORMAL LOW (ref 12.0–15.0)

## 2012-07-08 MED ORDER — FERUMOXYTOL INJECTION 510 MG/17 ML
INTRAVENOUS | Status: AC
  Administered 2012-07-08: 510 mg via INTRAVENOUS
  Filled 2012-07-08: qty 17

## 2012-07-08 MED ORDER — EPOETIN ALFA 20000 UNIT/ML IJ SOLN
INTRAMUSCULAR | Status: AC
  Administered 2012-07-08: 20000 [IU] via SUBCUTANEOUS
  Filled 2012-07-08: qty 1

## 2012-07-08 MED ORDER — EPOETIN ALFA 20000 UNIT/ML IJ SOLN
20000.0000 [IU] | INTRAMUSCULAR | Status: DC
  Administered 2012-07-08: 20000 [IU] via SUBCUTANEOUS

## 2012-07-08 MED ORDER — SODIUM CHLORIDE 0.9 % IV SOLN
INTRAVENOUS | Status: AC
  Administered 2012-07-08: 14:00:00 via INTRAVENOUS

## 2012-07-08 MED ORDER — FERUMOXYTOL INJECTION 510 MG/17 ML
510.0000 mg | INTRAVENOUS | Status: AC
  Administered 2012-07-08: 510 mg via INTRAVENOUS

## 2012-07-15 ENCOUNTER — Encounter (HOSPITAL_COMMUNITY)
Admission: RE | Admit: 2012-07-15 | Discharge: 2012-07-15 | Disposition: A | Discharge status: Home / Self Care | Payer: BC Managed Care – PPO | Source: Ambulatory Visit | Attending: Nephrology | Admitting: Nephrology

## 2012-07-15 LAB — RENAL FUNCTION PANEL
Albumin: 3.5 g/dL (ref 3.5–5.2)
BUN: 46 mg/dL — ABNORMAL HIGH (ref 6–23)
CO2: 26 mEq/L (ref 19–32)
Calcium: 9.6 mg/dL (ref 8.4–10.5)
Chloride: 96 mEq/L (ref 96–112)
Creatinine, Ser: 4.96 mg/dL — ABNORMAL HIGH (ref 0.50–1.10)
GFR calc Af Amer: 10 mL/min — ABNORMAL LOW (ref >90)
GFR calc non Af Amer: 9 mL/min — ABNORMAL LOW (ref >90)
Glucose, Bld: 201 mg/dL — ABNORMAL HIGH (ref 70–99)
Phosphorus: 4.8 mg/dL — ABNORMAL HIGH (ref 2.3–4.6)
Potassium: 4 mEq/L (ref 3.5–5.1)
Sodium: 136 mEq/L (ref 135–145)

## 2012-07-15 LAB — POCT HEMOGLOBIN-HEMACUE: Hemoglobin: 11.3 g/dL — ABNORMAL LOW (ref 12.0–15.0)

## 2012-07-15 MED ORDER — EPOETIN ALFA 20000 UNIT/ML IJ SOLN
20000.0000 [IU] | INTRAMUSCULAR | Status: DC
  Administered 2012-07-15: 20000 [IU] via SUBCUTANEOUS

## 2012-07-15 MED ORDER — EPOETIN ALFA 20000 UNIT/ML IJ SOLN
INTRAMUSCULAR | Status: AC
  Filled 2012-07-15: qty 1

## 2012-07-16 LAB — IRON AND TIBC
Iron: 78 ug/dL (ref 42–135)
Saturation Ratios: 34 % (ref 20–55)
TIBC: 229 ug/dL — ABNORMAL LOW (ref 250–470)
UIBC: 151 ug/dL (ref 125–400)

## 2012-07-16 LAB — FERRITIN: Ferritin: 1076 ng/mL — ABNORMAL HIGH (ref 10–291)

## 2012-07-18 LAB — PTH, INTACT AND CALCIUM
Calcium, Total (PTH): 9.4 mg/dL (ref 8.4–10.5)
PTH: 129.6 pg/mL — ABNORMAL HIGH (ref 14.0–72.0)

## 2012-07-22 ENCOUNTER — Encounter (HOSPITAL_COMMUNITY)
Admission: RE | Admit: 2012-07-22 | Discharge: 2012-07-22 | Disposition: A | Discharge status: Home / Self Care | Payer: BC Managed Care – PPO | Source: Ambulatory Visit | Attending: Nephrology | Admitting: Nephrology

## 2012-07-22 LAB — POCT HEMOGLOBIN-HEMACUE: Hemoglobin: 11.7 g/dL — ABNORMAL LOW (ref 12.0–15.0)

## 2012-07-22 MED ORDER — EPOETIN ALFA 20000 UNIT/ML IJ SOLN: 20000.0000 [IU] | INTRAMUSCULAR | Status: DC

## 2012-08-04 ENCOUNTER — Other Ambulatory Visit (HOSPITAL_COMMUNITY): Payer: Self-pay | Admitting: *Deleted

## 2012-08-05 ENCOUNTER — Encounter (HOSPITAL_COMMUNITY)
Admission: RE | Admit: 2012-08-05 | Discharge: 2012-08-05 | Disposition: A | Discharge status: Home / Self Care | Payer: BC Managed Care – PPO | Source: Ambulatory Visit | Attending: Nephrology | Admitting: Nephrology

## 2012-08-05 DIAGNOSIS — D638 Anemia in other chronic diseases classified elsewhere: Secondary | ICD-10-CM | POA: Insufficient documentation

## 2012-08-05 DIAGNOSIS — N184 Chronic kidney disease, stage 4 (severe): Secondary | ICD-10-CM | POA: Insufficient documentation

## 2012-08-05 LAB — POCT HEMOGLOBIN-HEMACUE: Hemoglobin: 12 g/dL (ref 12.0–15.0)

## 2012-08-05 MED ORDER — EPOETIN ALFA 20000 UNIT/ML IJ SOLN: 20000.0000 [IU] | INTRAMUSCULAR | Status: DC

## 2012-08-13 ENCOUNTER — Other Ambulatory Visit: Payer: Self-pay | Admitting: Internal Medicine

## 2012-08-18 ENCOUNTER — Other Ambulatory Visit (HOSPITAL_COMMUNITY): Payer: Self-pay | Admitting: *Deleted

## 2012-08-19 ENCOUNTER — Encounter (HOSPITAL_COMMUNITY)
Admission: RE | Admit: 2012-08-19 | Discharge: 2012-08-19 | Disposition: A | Discharge status: Home / Self Care | Payer: BC Managed Care – PPO | Source: Ambulatory Visit | Attending: Nephrology | Admitting: Nephrology

## 2012-08-19 LAB — RENAL FUNCTION PANEL
Albumin: 3.7 g/dL (ref 3.5–5.2)
BUN: 65 mg/dL — ABNORMAL HIGH (ref 6–23)
CO2: 24 mEq/L (ref 19–32)
Calcium: 9.6 mg/dL (ref 8.4–10.5)
Chloride: 96 mEq/L (ref 96–112)
Creatinine, Ser: 5.49 mg/dL — ABNORMAL HIGH (ref 0.50–1.10)
GFR calc Af Amer: 9 mL/min — ABNORMAL LOW (ref >90)
GFR calc non Af Amer: 8 mL/min — ABNORMAL LOW (ref >90)
Glucose, Bld: 239 mg/dL — ABNORMAL HIGH (ref 70–99)
Phosphorus: 5.1 mg/dL — ABNORMAL HIGH (ref 2.3–4.6)
Potassium: 3.9 mEq/L (ref 3.5–5.1)
Sodium: 136 mEq/L (ref 135–145)

## 2012-08-19 LAB — POCT HEMOGLOBIN-HEMACUE: Hemoglobin: 10.6 g/dL — ABNORMAL LOW (ref 12.0–15.0)

## 2012-08-19 MED ORDER — EPOETIN ALFA 20000 UNIT/ML IJ SOLN
20000.0000 [IU] | INTRAMUSCULAR | Status: DC
  Administered 2012-08-19: 20000 [IU] via SUBCUTANEOUS

## 2012-08-19 MED ORDER — EPOETIN ALFA 20000 UNIT/ML IJ SOLN
INTRAMUSCULAR | Status: AC
  Filled 2012-08-19: qty 1

## 2012-08-20 LAB — IRON AND TIBC
Iron: 108 ug/dL (ref 42–135)
Saturation Ratios: 47 % (ref 20–55)
TIBC: 230 ug/dL — ABNORMAL LOW (ref 250–470)
UIBC: 122 ug/dL — ABNORMAL LOW (ref 125–400)

## 2012-08-20 LAB — FERRITIN: Ferritin: 1142 ng/mL — ABNORMAL HIGH (ref 10–291)

## 2012-08-22 LAB — PTH, INTACT AND CALCIUM
Calcium, Total (PTH): 9 mg/dL (ref 8.4–10.5)
PTH: 138.4 pg/mL — ABNORMAL HIGH (ref 14.0–72.0)

## 2012-08-30 ENCOUNTER — Other Ambulatory Visit (HOSPITAL_COMMUNITY): Payer: Self-pay | Admitting: *Deleted

## 2012-09-01 ENCOUNTER — Encounter (HOSPITAL_COMMUNITY)
Admission: RE | Admit: 2012-09-01 | Discharge: 2012-09-01 | Disposition: A | Discharge status: Home / Self Care | Payer: BC Managed Care – PPO | Source: Ambulatory Visit | Attending: Nephrology | Admitting: Nephrology

## 2012-09-01 DIAGNOSIS — D638 Anemia in other chronic diseases classified elsewhere: Secondary | ICD-10-CM | POA: Insufficient documentation

## 2012-09-01 DIAGNOSIS — N184 Chronic kidney disease, stage 4 (severe): Secondary | ICD-10-CM | POA: Insufficient documentation

## 2012-09-01 LAB — POCT HEMOGLOBIN-HEMACUE: Hemoglobin: 10.3 g/dL — ABNORMAL LOW (ref 12.0–15.0)

## 2012-09-01 MED ORDER — EPOETIN ALFA 20000 UNIT/ML IJ SOLN
INTRAMUSCULAR | Status: AC
  Administered 2012-09-01: 20000 [IU] via SUBCUTANEOUS
  Filled 2012-09-01: qty 1

## 2012-09-01 MED ORDER — EPOETIN ALFA 20000 UNIT/ML IJ SOLN
20000.0000 [IU] | INTRAMUSCULAR | Status: DC
  Administered 2012-09-01: 20000 [IU] via SUBCUTANEOUS

## 2012-09-16 ENCOUNTER — Encounter (HOSPITAL_COMMUNITY)
Admission: RE | Admit: 2012-09-16 | Discharge: 2012-09-16 | Disposition: A | Discharge status: Home / Self Care | Payer: BC Managed Care – PPO | Source: Ambulatory Visit | Attending: Nephrology | Admitting: Nephrology

## 2012-09-16 LAB — RENAL FUNCTION PANEL
Albumin: 3.6 g/dL (ref 3.5–5.2)
BUN: 54 mg/dL — ABNORMAL HIGH (ref 6–23)
CO2: 28 mEq/L (ref 19–32)
Calcium: 10.3 mg/dL (ref 8.4–10.5)
Chloride: 95 mEq/L — ABNORMAL LOW (ref 96–112)
Creatinine, Ser: 6.2 mg/dL — ABNORMAL HIGH (ref 0.50–1.10)
GFR calc Af Amer: 8 mL/min — ABNORMAL LOW (ref >90)
GFR calc non Af Amer: 7 mL/min — ABNORMAL LOW (ref >90)
Glucose, Bld: 236 mg/dL — ABNORMAL HIGH (ref 70–99)
Phosphorus: 6.1 mg/dL — ABNORMAL HIGH (ref 2.3–4.6)
Potassium: 4 mEq/L (ref 3.5–5.1)
Sodium: 135 mEq/L (ref 135–145)

## 2012-09-16 LAB — IRON AND TIBC
Iron: 76 ug/dL (ref 42–135)
Saturation Ratios: 34 % (ref 20–55)
TIBC: 222 ug/dL — ABNORMAL LOW (ref 250–470)
UIBC: 146 ug/dL (ref 125–400)

## 2012-09-16 LAB — FERRITIN: Ferritin: 899 ng/mL — ABNORMAL HIGH (ref 10–291)

## 2012-09-16 LAB — POCT HEMOGLOBIN-HEMACUE: Hemoglobin: 10.1 g/dL — ABNORMAL LOW (ref 12.0–15.0)

## 2012-09-16 MED ORDER — EPOETIN ALFA 20000 UNIT/ML IJ SOLN
INTRAMUSCULAR | Status: AC
  Filled 2012-09-16: qty 1

## 2012-09-16 MED ORDER — EPOETIN ALFA 20000 UNIT/ML IJ SOLN
20000.0000 [IU] | INTRAMUSCULAR | Status: DC
  Administered 2012-09-16: 20000 [IU] via SUBCUTANEOUS

## 2012-09-19 LAB — PTH, INTACT AND CALCIUM
Calcium, Total (PTH): 9.6 mg/dL (ref 8.4–10.5)
PTH: 78.1 pg/mL — ABNORMAL HIGH (ref 14.0–72.0)

## 2012-09-30 ENCOUNTER — Encounter (HOSPITAL_COMMUNITY)
Admission: RE | Admit: 2012-09-30 | Discharge: 2012-09-30 | Disposition: A | Discharge status: Home / Self Care | Payer: BC Managed Care – PPO | Source: Ambulatory Visit | Attending: Nephrology | Admitting: Nephrology

## 2012-09-30 LAB — POCT HEMOGLOBIN-HEMACUE: Hemoglobin: 9.2 g/dL — ABNORMAL LOW (ref 12.0–15.0)

## 2012-09-30 MED ORDER — EPOETIN ALFA 20000 UNIT/ML IJ SOLN
INTRAMUSCULAR | Status: AC
  Administered 2012-09-30: 20000 [IU] via SUBCUTANEOUS
  Filled 2012-09-30: qty 1

## 2012-09-30 MED ORDER — EPOETIN ALFA 20000 UNIT/ML IJ SOLN
20000.0000 [IU] | INTRAMUSCULAR | Status: DC
  Administered 2012-09-30: 20000 [IU] via SUBCUTANEOUS

## 2012-10-14 ENCOUNTER — Encounter (HOSPITAL_COMMUNITY): Payer: BC Managed Care – PPO

## 2012-10-17 ENCOUNTER — Encounter (HOSPITAL_COMMUNITY)
Admission: RE | Admit: 2012-10-17 | Discharge: 2012-10-17 | Disposition: A | Discharge status: Home / Self Care | Payer: BC Managed Care – PPO | Source: Ambulatory Visit | Attending: Nephrology | Admitting: Nephrology

## 2012-10-17 DIAGNOSIS — N184 Chronic kidney disease, stage 4 (severe): Secondary | ICD-10-CM | POA: Insufficient documentation

## 2012-10-17 DIAGNOSIS — D638 Anemia in other chronic diseases classified elsewhere: Secondary | ICD-10-CM | POA: Insufficient documentation

## 2012-10-17 LAB — RENAL FUNCTION PANEL
Albumin: 3.6 g/dL (ref 3.5–5.2)
BUN: 66 mg/dL — ABNORMAL HIGH (ref 6–23)
CO2: 26 mEq/L (ref 19–32)
Calcium: 10.4 mg/dL (ref 8.4–10.5)
Chloride: 95 mEq/L — ABNORMAL LOW (ref 96–112)
Creatinine, Ser: 6.11 mg/dL — ABNORMAL HIGH (ref 0.50–1.10)
GFR calc Af Amer: 8 mL/min — ABNORMAL LOW (ref >90)
GFR calc non Af Amer: 7 mL/min — ABNORMAL LOW (ref >90)
Glucose, Bld: 183 mg/dL — ABNORMAL HIGH (ref 70–99)
Phosphorus: 6.9 mg/dL — ABNORMAL HIGH (ref 2.3–4.6)
Potassium: 4.1 mEq/L (ref 3.5–5.1)
Sodium: 135 mEq/L (ref 135–145)

## 2012-10-17 LAB — IRON AND TIBC
Iron: 95 ug/dL (ref 42–135)
Saturation Ratios: 41 % (ref 20–55)
TIBC: 229 ug/dL — ABNORMAL LOW (ref 250–470)
UIBC: 134 ug/dL (ref 125–400)

## 2012-10-17 LAB — FERRITIN: Ferritin: 884 ng/mL — ABNORMAL HIGH (ref 10–291)

## 2012-10-17 LAB — POCT HEMOGLOBIN-HEMACUE: Hemoglobin: 9.5 g/dL — ABNORMAL LOW (ref 12.0–15.0)

## 2012-10-17 MED ORDER — EPOETIN ALFA 20000 UNIT/ML IJ SOLN: 20000.0000 [IU] | INTRAMUSCULAR | Status: DC

## 2012-10-17 MED ORDER — EPOETIN ALFA 20000 UNIT/ML IJ SOLN
INTRAMUSCULAR | Status: AC
  Administered 2012-10-17: 20000 [IU] via SUBCUTANEOUS
  Filled 2012-10-17: qty 1

## 2012-10-18 LAB — PTH, INTACT AND CALCIUM
Calcium, Total (PTH): 10.3 mg/dL (ref 8.4–10.5)
PTH: 27.6 pg/mL (ref 14.0–72.0)

## 2012-10-27 ENCOUNTER — Other Ambulatory Visit: Payer: Self-pay | Admitting: *Deleted

## 2012-10-27 DIAGNOSIS — Z01810 Encounter for preprocedural cardiovascular examination: Secondary | ICD-10-CM

## 2012-10-27 DIAGNOSIS — N184 Chronic kidney disease, stage 4 (severe): Secondary | ICD-10-CM

## 2012-10-28 ENCOUNTER — Other Ambulatory Visit (HOSPITAL_COMMUNITY): Payer: Self-pay | Admitting: *Deleted

## 2012-10-31 ENCOUNTER — Encounter (HOSPITAL_COMMUNITY)
Admission: RE | Admit: 2012-10-31 | Discharge: 2012-10-31 | Disposition: A | Discharge status: Home / Self Care | Payer: BC Managed Care – PPO | Source: Ambulatory Visit | Attending: Nephrology | Admitting: Nephrology

## 2012-10-31 DIAGNOSIS — D638 Anemia in other chronic diseases classified elsewhere: Secondary | ICD-10-CM | POA: Insufficient documentation

## 2012-10-31 DIAGNOSIS — N184 Chronic kidney disease, stage 4 (severe): Secondary | ICD-10-CM | POA: Insufficient documentation

## 2012-10-31 LAB — POCT HEMOGLOBIN-HEMACUE: Hemoglobin: 8.9 g/dL — ABNORMAL LOW (ref 12.0–15.0)

## 2012-10-31 MED ORDER — EPOETIN ALFA 20000 UNIT/ML IJ SOLN
20000.0000 [IU] | INTRAMUSCULAR | Status: DC
  Administered 2012-10-31: 20000 [IU] via SUBCUTANEOUS

## 2012-10-31 MED ORDER — EPOETIN ALFA 20000 UNIT/ML IJ SOLN
INTRAMUSCULAR | Status: AC
  Filled 2012-10-31: qty 1

## 2012-11-11 ENCOUNTER — Other Ambulatory Visit (HOSPITAL_COMMUNITY): Payer: Self-pay | Admitting: *Deleted

## 2012-11-14 ENCOUNTER — Encounter (HOSPITAL_COMMUNITY)
Admission: RE | Admit: 2012-11-14 | Discharge: 2012-11-14 | Disposition: A | Discharge status: Home / Self Care | Payer: BC Managed Care – PPO | Source: Ambulatory Visit | Attending: Nephrology | Admitting: Nephrology

## 2012-11-14 LAB — RENAL FUNCTION PANEL
Albumin: 3.5 g/dL (ref 3.5–5.2)
BUN: 61 mg/dL — ABNORMAL HIGH (ref 6–23)
CO2: 23 mEq/L (ref 19–32)
Calcium: 8.5 mg/dL (ref 8.4–10.5)
Chloride: 96 mEq/L (ref 96–112)
Creatinine, Ser: 5.83 mg/dL — ABNORMAL HIGH (ref 0.50–1.10)
GFR calc Af Amer: 8 mL/min — ABNORMAL LOW (ref >90)
GFR calc non Af Amer: 7 mL/min — ABNORMAL LOW (ref >90)
Glucose, Bld: 310 mg/dL — ABNORMAL HIGH (ref 70–99)
Phosphorus: 5.7 mg/dL — ABNORMAL HIGH (ref 2.3–4.6)
Potassium: 4 mEq/L (ref 3.5–5.1)
Sodium: 134 mEq/L — ABNORMAL LOW (ref 135–145)

## 2012-11-14 LAB — POCT HEMOGLOBIN-HEMACUE: Hemoglobin: 9.1 g/dL — ABNORMAL LOW (ref 12.0–15.0)

## 2012-11-14 MED ORDER — EPOETIN ALFA 20000 UNIT/ML IJ SOLN
INTRAMUSCULAR | Status: AC
  Administered 2012-11-14: 20000 [IU] via SUBCUTANEOUS
  Filled 2012-11-14: qty 1

## 2012-11-14 MED ORDER — EPOETIN ALFA 20000 UNIT/ML IJ SOLN: 20000.0000 [IU] | INTRAMUSCULAR | Status: DC

## 2012-11-15 LAB — IRON AND TIBC
Iron: 59 ug/dL (ref 42–135)
Saturation Ratios: 26 % (ref 20–55)
TIBC: 231 ug/dL — ABNORMAL LOW (ref 250–470)
UIBC: 172 ug/dL (ref 125–400)

## 2012-11-15 LAB — FERRITIN: Ferritin: 635 ng/mL — ABNORMAL HIGH (ref 10–291)

## 2012-11-15 LAB — PTH, INTACT AND CALCIUM
Calcium, Total (PTH): 8.1 mg/dL — ABNORMAL LOW (ref 8.4–10.5)
PTH: 287 pg/mL — ABNORMAL HIGH (ref 14.0–72.0)

## 2012-11-21 ENCOUNTER — Inpatient Hospital Stay (HOSPITAL_COMMUNITY): Admission: RE | Admit: 2012-11-21 | Payer: BC Managed Care – PPO | Source: Ambulatory Visit

## 2012-11-28 ENCOUNTER — Encounter (HOSPITAL_COMMUNITY)
Admission: RE | Admit: 2012-11-28 | Discharge: 2012-11-28 | Disposition: A | Discharge status: Home / Self Care | Payer: BC Managed Care – PPO | Source: Ambulatory Visit | Attending: Nephrology | Admitting: Nephrology

## 2012-11-28 LAB — POCT HEMOGLOBIN-HEMACUE: Hemoglobin: 8.5 g/dL — ABNORMAL LOW (ref 12.0–15.0)

## 2012-11-28 MED ORDER — EPOETIN ALFA 20000 UNIT/ML IJ SOLN
INTRAMUSCULAR | Status: AC
  Administered 2012-11-28: 20000 [IU] via SUBCUTANEOUS
  Filled 2012-11-28: qty 1

## 2012-11-28 MED ORDER — EPOETIN ALFA 20000 UNIT/ML IJ SOLN: 20000.0000 [IU] | INTRAMUSCULAR | Status: DC

## 2012-12-05 ENCOUNTER — Encounter (HOSPITAL_COMMUNITY)
Admission: RE | Admit: 2012-12-05 | Discharge: 2012-12-05 | Disposition: A | Discharge status: Home / Self Care | Payer: BC Managed Care – PPO | Source: Ambulatory Visit | Attending: Nephrology | Admitting: Nephrology

## 2012-12-05 DIAGNOSIS — D638 Anemia in other chronic diseases classified elsewhere: Secondary | ICD-10-CM | POA: Insufficient documentation

## 2012-12-05 DIAGNOSIS — N184 Chronic kidney disease, stage 4 (severe): Secondary | ICD-10-CM | POA: Insufficient documentation

## 2012-12-05 LAB — POCT HEMOGLOBIN-HEMACUE: Hemoglobin: 8.8 g/dL — ABNORMAL LOW (ref 12.0–15.0)

## 2012-12-05 MED ORDER — EPOETIN ALFA 20000 UNIT/ML IJ SOLN
INTRAMUSCULAR | Status: AC
  Filled 2012-12-05: qty 1

## 2012-12-05 MED ORDER — EPOETIN ALFA 20000 UNIT/ML IJ SOLN
20000.0000 [IU] | INTRAMUSCULAR | Status: DC
  Administered 2012-12-05: 20000 [IU] via SUBCUTANEOUS

## 2012-12-12 ENCOUNTER — Encounter (HOSPITAL_COMMUNITY)
Admission: RE | Admit: 2012-12-12 | Discharge: 2012-12-12 | Disposition: A | Discharge status: Home / Self Care | Payer: BC Managed Care – PPO | Source: Ambulatory Visit | Attending: Nephrology | Admitting: Nephrology

## 2012-12-12 LAB — FERRITIN: Ferritin: 367 ng/mL — ABNORMAL HIGH (ref 10–291)

## 2012-12-12 LAB — RENAL FUNCTION PANEL
Albumin: 3.4 g/dL — ABNORMAL LOW (ref 3.5–5.2)
BUN: 47 mg/dL — ABNORMAL HIGH (ref 6–23)
CO2: 23 mEq/L (ref 19–32)
Calcium: 8.7 mg/dL (ref 8.4–10.5)
Chloride: 100 mEq/L (ref 96–112)
Creatinine, Ser: 5.74 mg/dL — ABNORMAL HIGH (ref 0.50–1.10)
GFR calc Af Amer: 8 mL/min — ABNORMAL LOW (ref >90)
GFR calc non Af Amer: 7 mL/min — ABNORMAL LOW (ref >90)
Glucose, Bld: 301 mg/dL — ABNORMAL HIGH (ref 70–99)
Phosphorus: 6.4 mg/dL — ABNORMAL HIGH (ref 2.3–4.6)
Potassium: 3.9 mEq/L (ref 3.5–5.1)
Sodium: 135 mEq/L (ref 135–145)

## 2012-12-12 LAB — IRON AND TIBC
Iron: 36 ug/dL — ABNORMAL LOW (ref 42–135)
Saturation Ratios: 16 % — ABNORMAL LOW (ref 20–55)
TIBC: 221 ug/dL — ABNORMAL LOW (ref 250–470)
UIBC: 185 ug/dL (ref 125–400)

## 2012-12-12 LAB — POCT HEMOGLOBIN-HEMACUE: Hemoglobin: 10 g/dL — ABNORMAL LOW (ref 12.0–15.0)

## 2012-12-12 MED ORDER — EPOETIN ALFA 20000 UNIT/ML IJ SOLN
20000.0000 [IU] | INTRAMUSCULAR | Status: DC
  Administered 2012-12-12: 20000 [IU] via SUBCUTANEOUS

## 2012-12-12 MED ORDER — EPOETIN ALFA 20000 UNIT/ML IJ SOLN
INTRAMUSCULAR | Status: AC
  Filled 2012-12-12: qty 1

## 2012-12-13 LAB — PTH, INTACT AND CALCIUM
Calcium, Total (PTH): 8.4 mg/dL (ref 8.4–10.5)
PTH: 333.1 pg/mL — ABNORMAL HIGH (ref 14.0–72.0)

## 2012-12-15 ENCOUNTER — Other Ambulatory Visit: Payer: Self-pay | Admitting: Internal Medicine

## 2012-12-16 ENCOUNTER — Other Ambulatory Visit (HOSPITAL_COMMUNITY): Payer: Self-pay | Admitting: *Deleted

## 2012-12-19 ENCOUNTER — Encounter (HOSPITAL_COMMUNITY)
Admission: RE | Admit: 2012-12-19 | Discharge: 2012-12-19 | Disposition: A | Discharge status: Home / Self Care | Payer: BC Managed Care – PPO | Source: Ambulatory Visit | Attending: Nephrology | Admitting: Nephrology

## 2012-12-19 LAB — POCT HEMOGLOBIN-HEMACUE: Hemoglobin: 10.8 g/dL — ABNORMAL LOW (ref 12.0–15.0)

## 2012-12-19 MED ORDER — EPOETIN ALFA 20000 UNIT/ML IJ SOLN: 20000.0000 [IU] | INTRAMUSCULAR | Status: DC

## 2012-12-19 MED ORDER — EPOETIN ALFA 20000 UNIT/ML IJ SOLN
INTRAMUSCULAR | Status: AC
  Administered 2012-12-19: 20000 [IU] via SUBCUTANEOUS
  Filled 2012-12-19: qty 1

## 2012-12-19 MED ORDER — SODIUM CHLORIDE 0.9 % IV SOLN
1020.0000 mg | Freq: Once | INTRAVENOUS | Status: AC
  Administered 2012-12-19: 1020 mg via INTRAVENOUS
  Filled 2012-12-19: qty 34

## 2012-12-19 MED ORDER — SODIUM CHLORIDE 0.9 % IV SOLN
INTRAVENOUS | Status: DC
  Administered 2012-12-19: 11:00:00 via INTRAVENOUS

## 2012-12-26 ENCOUNTER — Encounter (HOSPITAL_COMMUNITY)
Admission: RE | Admit: 2012-12-26 | Discharge: 2012-12-26 | Disposition: A | Discharge status: Home / Self Care | Payer: BC Managed Care – PPO | Source: Ambulatory Visit | Attending: Nephrology | Admitting: Nephrology

## 2012-12-26 LAB — POCT HEMOGLOBIN-HEMACUE: Hemoglobin: 11.1 g/dL — ABNORMAL LOW (ref 12.0–15.0)

## 2012-12-26 MED ORDER — EPOETIN ALFA 20000 UNIT/ML IJ SOLN
20000.0000 [IU] | INTRAMUSCULAR | Status: DC
  Administered 2012-12-26: 20000 [IU] via SUBCUTANEOUS

## 2012-12-26 MED ORDER — EPOETIN ALFA 20000 UNIT/ML IJ SOLN
INTRAMUSCULAR | Status: AC
  Filled 2012-12-26: qty 1

## 2012-12-28 ENCOUNTER — Ambulatory Visit: Payer: BC Managed Care – PPO | Admitting: Vascular Surgery

## 2012-12-30 ENCOUNTER — Other Ambulatory Visit (HOSPITAL_COMMUNITY): Payer: Self-pay | Admitting: *Deleted

## 2013-01-02 ENCOUNTER — Encounter (HOSPITAL_COMMUNITY)
Admission: RE | Admit: 2013-01-02 | Discharge: 2013-01-02 | Disposition: A | Discharge status: Home / Self Care | Payer: BC Managed Care – PPO | Source: Ambulatory Visit | Attending: Nephrology | Admitting: Nephrology

## 2013-01-02 DIAGNOSIS — D638 Anemia in other chronic diseases classified elsewhere: Secondary | ICD-10-CM | POA: Insufficient documentation

## 2013-01-02 DIAGNOSIS — N184 Chronic kidney disease, stage 4 (severe): Secondary | ICD-10-CM | POA: Insufficient documentation

## 2013-01-02 MED ORDER — EPOETIN ALFA 20000 UNIT/ML IJ SOLN: 20000.0000 [IU] | INTRAMUSCULAR | Status: DC

## 2013-01-03 LAB — POCT HEMOGLOBIN-HEMACUE: Hemoglobin: 12.6 g/dL (ref 12.0–15.0)

## 2013-01-06 ENCOUNTER — Ambulatory Visit (INDEPENDENT_AMBULATORY_CARE_PROVIDER_SITE_OTHER): Payer: BC Managed Care – PPO | Admitting: Internal Medicine

## 2013-01-06 ENCOUNTER — Encounter: Payer: Self-pay | Admitting: Internal Medicine

## 2013-01-06 ENCOUNTER — Encounter (INDEPENDENT_AMBULATORY_CARE_PROVIDER_SITE_OTHER): Payer: BC Managed Care – PPO

## 2013-01-06 VITALS — BP 172/80 | HR 88 | Temp 99.9°F | Ht 60.0 in | Wt 140.2 lb

## 2013-01-06 DIAGNOSIS — M79605 Pain in left leg: Secondary | ICD-10-CM

## 2013-01-06 DIAGNOSIS — R229 Localized swelling, mass and lump, unspecified: Secondary | ICD-10-CM

## 2013-01-06 DIAGNOSIS — M7989 Other specified soft tissue disorders: Secondary | ICD-10-CM

## 2013-01-06 DIAGNOSIS — IMO0001 Reserved for inherently not codable concepts without codable children: Secondary | ICD-10-CM

## 2013-01-06 DIAGNOSIS — M79609 Pain in unspecified limb: Secondary | ICD-10-CM

## 2013-01-06 DIAGNOSIS — I10 Essential (primary) hypertension: Secondary | ICD-10-CM

## 2013-01-06 MED ORDER — HYDROCODONE-ACETAMINOPHEN 5-325 MG PO TABS: 1.0000 | ORAL_TABLET | Freq: Four times a day (QID) | ORAL | Status: DC | PRN

## 2013-01-06 NOTE — Progress Notes (Signed)
Subjective:    Patient ID: Carolyn Valenzuela, female    DOB: 1950/09/22, 62 y.o.   MRN: [id number]  HPI  Here with 1 wk onset left post calf burning pain and feeling of a knot similar to an episode of blood clot in a vein over a yr ago, now moderate and not worse since onset, but not getting better or well;  Walking makes mild worse, sitting makes better and little discomfort with sitting besides the feeling of tightness to the leg below the knee and achiness to anterior shin as well. Pt denies chest pain, increased sob or doe, wheezing, orthopnea, PND, increased LE swelling, palpitations, dizziness or syncope.  Pt denies new neurological symptoms such as new headache, or facial or extremity weakness or numbness   Pt denies polydipsia, polyuria. Hx ESRD Past Medical History  Diagnosis Date  . DIABETES MELLITUS, UNCONTROLLED 05/21/2009  . HYPERLIPIDEMIA 03/30/2007  . ANEMIA-IRON DEFICIENCY 01/25/2008  . HYPERTENSION 01/25/2008  . SINUSITIS- ACUTE-NOS 01/25/2008  . SECONDARY HYPERPARATHYROIDISM 05/21/2009  . RENAL INSUFFICIENCY 02/01/2008  . CERVICAL RADICULOPATHY, LEFT 01/25/2008  . BACK PAIN 05/02/2009  . NUMBNESS 05/21/2009  . Allergic rhinitis, cause unspecified 12/08/2010   Past Surgical History  Procedure Laterality Date  . Av fistula placement    . Eye surgery      reports that she has never smoked. She has never used smokeless tobacco. She reports that she does not drink alcohol or use illicit drugs. family history includes Cancer in her mother. Allergies  Allergen Reactions  . Oxycodone Nausea And Vomiting   Current Outpatient Prescriptions on File Prior to Visit  Medication Sig Dispense Refill  . amLODipine (NORVASC) 2.5 MG tablet Take 1 tablet (2.5 mg total) by mouth 2 (two) times daily.  60 tablet  11  . aspirin 81 MG EC tablet Take 81 mg by mouth daily.        . carvedilol (COREG) 25 MG tablet TAKE ONE TABLET BY MOUTH TWICE DAILY  60 tablet  11  . furosemide (LASIX) 40 MG tablet TAKE  ONE TABLET BY MOUTH TWICE DAILY  180 tablet  1  . glimepiride (AMARYL) 4 MG tablet TAKE ONE TABLET BY MOUTH TWICE DAILY  180 tablet  3  . glucose blood (ONE TOUCH ULTRA TEST) test strip 1 each. Use as directed two times a day       . insulin glargine (LANTUS SOLOSTAR) 100 UNIT/ML injection Inject 55 Units into the skin daily.  15 mL  11  . JANUVIA 100 MG tablet TAKE ONE TABLET BY MOUTH EVERY DAY  90 tablet  1  . LANTUS SOLOSTAR 100 UNIT/ML injection INJECT 55 UNITS SUBCUTANEOUSLY EVERY DAY  15 mL  10  . ONE TOUCH LANCETS MISC Use as directed two times a day       . simvastatin (ZOCOR) 20 MG tablet Take 1 tablet (20 mg total) by mouth at bedtime.  90 tablet  3  . sitaGLIPtin (JANUVIA) 100 MG tablet Take 1 tablet (100 mg total) by mouth daily.  90 tablet  3  . fexofenadine (ALLEGRA) 180 MG tablet Take 1 tablet (180 mg total) by mouth daily.  30 tablet  2  . pantoprazole (PROTONIX) 40 MG tablet Take 1 tablet (40 mg total) by mouth daily.  30 tablet  11   No current facility-administered medications on file prior to visit.   Review of Systems  Constitutional: Negative for unexpected weight change, or unusual diaphoresis  HENT: Negative for tinnitus.  Eyes: Negative for photophobia and visual disturbance.  Respiratory: Negative for choking and stridor.   Gastrointestinal: Negative for vomiting and blood in stool.  Genitourinary: Negative for hematuria and decreased urine volume.  Musculoskeletal: Negative for acute joint swelling Skin: Negative for color change and wound.  Neurological: Negative for tremors and numbness other than noted  Psychiatric/Behavioral: Negative for decreased concentration or  hyperactivity.       Objective:   Physical Exam BP 172/80  Pulse 88  Temp(Src) 99.9 F (37.7 C) (Oral)  Ht 5' (1.524 m)  Wt 140 lb 4 oz (63.617 kg)  BMI 27.39 kg/m2  SpO2 98% VS noted,  Constitutional: Pt appears well-developed and well-nourished.  HENT: Head: NCAT.  Right Ear:  External ear normal.  Left Ear: External ear normal.  Eyes: Conjunctivae and EOM are normal. Pupils are equal, round, and reactive to light.  Neck: Normal range of motion. Neck supple.  Cardiovascular: Normal rate and regular rhythm.   Pulmonary/Chest: Effort normal and breath sounds normal.  Abd:  Soft, NT, non-distended, + BS Neurological: Pt is alert. Not confused  Left leg below knee with diffuse mild tender, swelling but not tense or significant erythema, and has palpable linear and long approx 8 cm subq firm mild tender probable vein post left gastroc area  Psychiatric: Pt behavior is normal. Thought content normal.     Assessment & Plan:

## 2013-01-06 NOTE — Assessment & Plan Note (Signed)
Has obvious but rather extensive at least superficial phlebitis to post left calf, with diffuse left leg swelling and pain anteriorly as well - for LLE venous doppler

## 2013-01-06 NOTE — Patient Instructions (Addendum)
You can take tylenol for pain or the hydrocodone if needed You will be contacted regarding the referral for: Left lower extremity Venous Doppler - to see the PCC's now Please continue all other medications as before, and refills have been done if requested. Please have the pharmacy call with any other refills you may need. Please keep your appointments with your specialists as you have planned - kidney doctors as you do Please remember to sign up for My Chart if you have not done so, as this will be important to you in the future with finding out test results, communicating by private email, and scheduling acute appointments online when needed.

## 2013-01-07 NOTE — Assessment & Plan Note (Signed)
stable overall by history and exam, recent data reviewed with pt, and pt to continue medical treatment as before,  to f/u any worsening symptoms or concerns BP Readings from Last 3 Encounters:  01/06/13 172/80  01/02/13 143/73  12/26/12 165/68

## 2013-01-07 NOTE — Assessment & Plan Note (Signed)
stable overall by history,  and pt to continue medical treatment as before,  to f/u any worsening symptoms or concerns

## 2013-01-09 ENCOUNTER — Encounter: Payer: Self-pay | Admitting: Internal Medicine

## 2013-01-09 ENCOUNTER — Encounter (HOSPITAL_COMMUNITY): Payer: BC Managed Care – PPO

## 2013-01-11 ENCOUNTER — Ambulatory Visit: Payer: BC Managed Care – PPO | Admitting: Vascular Surgery

## 2013-01-16 ENCOUNTER — Encounter (HOSPITAL_COMMUNITY): Payer: BC Managed Care – PPO

## 2013-01-17 ENCOUNTER — Encounter (HOSPITAL_COMMUNITY): Payer: BC Managed Care – PPO

## 2013-01-24 ENCOUNTER — Encounter (HOSPITAL_COMMUNITY)
Admission: RE | Admit: 2013-01-24 | Discharge: 2013-01-24 | Disposition: A | Discharge status: Home / Self Care | Payer: BC Managed Care – PPO | Source: Ambulatory Visit | Attending: Nephrology | Admitting: Nephrology

## 2013-01-24 LAB — RENAL FUNCTION PANEL
Albumin: 3.2 g/dL — ABNORMAL LOW (ref 3.5–5.2)
BUN: 56 mg/dL — ABNORMAL HIGH (ref 6–23)
CO2: 23 mEq/L (ref 19–32)
Calcium: 9 mg/dL (ref 8.4–10.5)
Chloride: 99 mEq/L (ref 96–112)
Creatinine, Ser: 5.61 mg/dL — ABNORMAL HIGH (ref 0.50–1.10)
GFR calc Af Amer: 9 mL/min — ABNORMAL LOW (ref >90)
GFR calc non Af Amer: 7 mL/min — ABNORMAL LOW (ref >90)
Glucose, Bld: 113 mg/dL — ABNORMAL HIGH (ref 70–99)
Phosphorus: 5.7 mg/dL — ABNORMAL HIGH (ref 2.3–4.6)
Potassium: 3.9 mEq/L (ref 3.5–5.1)
Sodium: 138 mEq/L (ref 135–145)

## 2013-01-24 LAB — IRON AND TIBC
Iron: 87 ug/dL (ref 42–135)
Saturation Ratios: 44 % (ref 20–55)
TIBC: 197 ug/dL — ABNORMAL LOW (ref 250–470)
UIBC: 110 ug/dL — ABNORMAL LOW (ref 125–400)

## 2013-01-24 LAB — POCT HEMOGLOBIN-HEMACUE: Hemoglobin: 10.9 g/dL — ABNORMAL LOW (ref 12.0–15.0)

## 2013-01-24 LAB — FERRITIN: Ferritin: 1110 ng/mL — ABNORMAL HIGH (ref 10–291)

## 2013-01-24 MED ORDER — EPOETIN ALFA 20000 UNIT/ML IJ SOLN
20000.0000 [IU] | INTRAMUSCULAR | Status: DC
  Administered 2013-01-24: 20000 [IU] via SUBCUTANEOUS

## 2013-01-24 MED ORDER — EPOETIN ALFA 20000 UNIT/ML IJ SOLN
INTRAMUSCULAR | Status: AC
  Filled 2013-01-24: qty 1

## 2013-01-25 LAB — PTH, INTACT AND CALCIUM
Calcium, Total (PTH): 8.5 mg/dL (ref 8.4–10.5)
PTH: 234.6 pg/mL — ABNORMAL HIGH (ref 14.0–72.0)

## 2013-02-07 ENCOUNTER — Encounter (HOSPITAL_COMMUNITY)
Admission: RE | Admit: 2013-02-07 | Discharge: 2013-02-07 | Disposition: A | Discharge status: Home / Self Care | Payer: BC Managed Care – PPO | Source: Ambulatory Visit | Attending: Nephrology | Admitting: Nephrology

## 2013-02-07 ENCOUNTER — Encounter (HOSPITAL_COMMUNITY): Payer: BC Managed Care – PPO

## 2013-02-07 DIAGNOSIS — D638 Anemia in other chronic diseases classified elsewhere: Secondary | ICD-10-CM | POA: Insufficient documentation

## 2013-02-07 DIAGNOSIS — N184 Chronic kidney disease, stage 4 (severe): Secondary | ICD-10-CM | POA: Insufficient documentation

## 2013-02-07 LAB — POCT HEMOGLOBIN-HEMACUE: Hemoglobin: 11.5 g/dL — ABNORMAL LOW (ref 12.0–15.0)

## 2013-02-07 MED ORDER — EPOETIN ALFA 20000 UNIT/ML IJ SOLN: 20000.0000 [IU] | INTRAMUSCULAR | Status: DC

## 2013-02-10 ENCOUNTER — Other Ambulatory Visit: Payer: Self-pay | Admitting: Internal Medicine

## 2013-02-20 ENCOUNTER — Encounter (HOSPITAL_COMMUNITY)
Admission: RE | Admit: 2013-02-20 | Discharge: 2013-02-20 | Disposition: A | Discharge status: Home / Self Care | Payer: BC Managed Care – PPO | Source: Ambulatory Visit | Attending: Nephrology | Admitting: Nephrology

## 2013-02-20 LAB — RENAL FUNCTION PANEL
Albumin: 3.3 g/dL — ABNORMAL LOW (ref 3.5–5.2)
BUN: 54 mg/dL — ABNORMAL HIGH (ref 6–23)
CO2: 28 mEq/L (ref 19–32)
Calcium: 9.1 mg/dL (ref 8.4–10.5)
Chloride: 99 mEq/L (ref 96–112)
Creatinine, Ser: 6.06 mg/dL — ABNORMAL HIGH (ref 0.50–1.10)
GFR calc Af Amer: 8 mL/min — ABNORMAL LOW (ref >90)
GFR calc non Af Amer: 7 mL/min — ABNORMAL LOW (ref >90)
Glucose, Bld: 189 mg/dL — ABNORMAL HIGH (ref 70–99)
Phosphorus: 5.4 mg/dL — ABNORMAL HIGH (ref 2.3–4.6)
Potassium: 4.3 mEq/L (ref 3.5–5.1)
Sodium: 137 mEq/L (ref 135–145)

## 2013-02-20 LAB — IRON AND TIBC
Iron: 88 ug/dL (ref 42–135)
Saturation Ratios: 41 % (ref 20–55)
TIBC: 217 ug/dL — ABNORMAL LOW (ref 250–470)
UIBC: 129 ug/dL (ref 125–400)

## 2013-02-20 LAB — FERRITIN: Ferritin: 1014 ng/mL — ABNORMAL HIGH (ref 10–291)

## 2013-02-20 LAB — POCT HEMOGLOBIN-HEMACUE: Hemoglobin: 10.9 g/dL — ABNORMAL LOW (ref 12.0–15.0)

## 2013-02-20 MED ORDER — EPOETIN ALFA 20000 UNIT/ML IJ SOLN: 20000.0000 [IU] | INTRAMUSCULAR | Status: DC

## 2013-02-20 MED ORDER — EPOETIN ALFA 20000 UNIT/ML IJ SOLN
INTRAMUSCULAR | Status: AC
  Administered 2013-02-20: 20000 [IU] via SUBCUTANEOUS
  Filled 2013-02-20: qty 1

## 2013-02-21 LAB — PTH, INTACT AND CALCIUM
Calcium, Total (PTH): 9 mg/dL (ref 8.4–10.5)
PTH: 255.8 pg/mL — ABNORMAL HIGH (ref 14.0–72.0)

## 2013-03-06 ENCOUNTER — Encounter (HOSPITAL_COMMUNITY)
Admission: RE | Admit: 2013-03-06 | Discharge: 2013-03-06 | Disposition: A | Discharge status: Home / Self Care | Payer: BC Managed Care – PPO | Source: Ambulatory Visit | Attending: Nephrology | Admitting: Nephrology

## 2013-03-06 DIAGNOSIS — N184 Chronic kidney disease, stage 4 (severe): Secondary | ICD-10-CM | POA: Insufficient documentation

## 2013-03-06 DIAGNOSIS — D638 Anemia in other chronic diseases classified elsewhere: Secondary | ICD-10-CM | POA: Insufficient documentation

## 2013-03-06 LAB — POCT HEMOGLOBIN-HEMACUE: Hemoglobin: 10.5 g/dL — ABNORMAL LOW (ref 12.0–15.0)

## 2013-03-06 MED ORDER — EPOETIN ALFA 20000 UNIT/ML IJ SOLN
INTRAMUSCULAR | Status: AC
  Filled 2013-03-06: qty 1

## 2013-03-06 MED ORDER — EPOETIN ALFA 20000 UNIT/ML IJ SOLN
20000.0000 [IU] | INTRAMUSCULAR | Status: DC
  Administered 2013-03-06: 20000 [IU] via SUBCUTANEOUS

## 2013-03-20 ENCOUNTER — Encounter (HOSPITAL_COMMUNITY)
Admission: RE | Admit: 2013-03-20 | Discharge: 2013-03-20 | Disposition: A | Discharge status: Home / Self Care | Payer: BC Managed Care – PPO | Source: Ambulatory Visit | Attending: Nephrology | Admitting: Nephrology

## 2013-03-20 LAB — RENAL FUNCTION PANEL
Albumin: 3.2 g/dL — ABNORMAL LOW (ref 3.5–5.2)
BUN: 55 mg/dL — ABNORMAL HIGH (ref 6–23)
CO2: 25 mEq/L (ref 19–32)
Calcium: 9 mg/dL (ref 8.4–10.5)
Chloride: 101 mEq/L (ref 96–112)
Creatinine, Ser: 5.47 mg/dL — ABNORMAL HIGH (ref 0.50–1.10)
GFR calc Af Amer: 9 mL/min — ABNORMAL LOW (ref >90)
GFR calc non Af Amer: 8 mL/min — ABNORMAL LOW (ref >90)
Glucose, Bld: 66 mg/dL — ABNORMAL LOW (ref 70–99)
Phosphorus: 6.5 mg/dL — ABNORMAL HIGH (ref 2.3–4.6)
Potassium: 3.5 mEq/L (ref 3.5–5.1)
Sodium: 140 mEq/L (ref 135–145)

## 2013-03-20 LAB — IRON AND TIBC
Iron: 75 ug/dL (ref 42–135)
Saturation Ratios: 33 % (ref 20–55)
TIBC: 224 ug/dL — ABNORMAL LOW (ref 250–470)
UIBC: 149 ug/dL (ref 125–400)

## 2013-03-20 LAB — POCT HEMOGLOBIN-HEMACUE: Hemoglobin: 10.9 g/dL — ABNORMAL LOW (ref 12.0–15.0)

## 2013-03-20 LAB — FERRITIN: Ferritin: 665 ng/mL — ABNORMAL HIGH (ref 10–291)

## 2013-03-20 MED ORDER — EPOETIN ALFA 20000 UNIT/ML IJ SOLN: 20000.0000 [IU] | INTRAMUSCULAR | Status: DC

## 2013-03-20 MED ORDER — EPOETIN ALFA 20000 UNIT/ML IJ SOLN
INTRAMUSCULAR | Status: AC
  Administered 2013-03-20: 20000 [IU] via SUBCUTANEOUS
  Filled 2013-03-20: qty 1

## 2013-03-21 LAB — PTH, INTACT AND CALCIUM
Calcium, Total (PTH): 8.7 mg/dL (ref 8.4–10.5)
PTH: 415.4 pg/mL — ABNORMAL HIGH (ref 14.0–72.0)

## 2013-03-31 ENCOUNTER — Other Ambulatory Visit (HOSPITAL_COMMUNITY): Payer: Self-pay | Admitting: *Deleted

## 2013-04-03 ENCOUNTER — Encounter (HOSPITAL_COMMUNITY)
Admission: RE | Admit: 2013-04-03 | Discharge: 2013-04-03 | Disposition: A | Discharge status: Home / Self Care | Payer: BC Managed Care – PPO | Source: Ambulatory Visit | Attending: Nephrology | Admitting: Nephrology

## 2013-04-03 DIAGNOSIS — D638 Anemia in other chronic diseases classified elsewhere: Secondary | ICD-10-CM | POA: Insufficient documentation

## 2013-04-03 DIAGNOSIS — N184 Chronic kidney disease, stage 4 (severe): Secondary | ICD-10-CM | POA: Insufficient documentation

## 2013-04-03 LAB — POCT HEMOGLOBIN-HEMACUE: Hemoglobin: 10.6 g/dL — ABNORMAL LOW (ref 12.0–15.0)

## 2013-04-03 MED ORDER — EPOETIN ALFA 20000 UNIT/ML IJ SOLN
20000.0000 [IU] | INTRAMUSCULAR | Status: DC
  Administered 2013-04-03: 20000 [IU] via SUBCUTANEOUS

## 2013-04-03 MED ORDER — EPOETIN ALFA 20000 UNIT/ML IJ SOLN
INTRAMUSCULAR | Status: AC
  Filled 2013-04-03: qty 1

## 2013-04-17 ENCOUNTER — Encounter (HOSPITAL_COMMUNITY): Payer: BC Managed Care – PPO

## 2013-04-19 ENCOUNTER — Encounter (HOSPITAL_COMMUNITY)
Admission: RE | Admit: 2013-04-19 | Discharge: 2013-04-19 | Disposition: A | Discharge status: Home / Self Care | Payer: BC Managed Care – PPO | Source: Ambulatory Visit | Attending: Nephrology | Admitting: Nephrology

## 2013-04-19 LAB — RENAL FUNCTION PANEL
Albumin: 3.2 g/dL — ABNORMAL LOW (ref 3.5–5.2)
BUN: 44 mg/dL — ABNORMAL HIGH (ref 6–23)
CO2: 25 mEq/L (ref 19–32)
Calcium: 9.3 mg/dL (ref 8.4–10.5)
Chloride: 99 mEq/L (ref 96–112)
Creatinine, Ser: 6.21 mg/dL — ABNORMAL HIGH (ref 0.50–1.10)
GFR calc Af Amer: 8 mL/min — ABNORMAL LOW (ref >90)
GFR calc non Af Amer: 7 mL/min — ABNORMAL LOW (ref >90)
Glucose, Bld: 104 mg/dL — ABNORMAL HIGH (ref 70–99)
Phosphorus: 6.5 mg/dL — ABNORMAL HIGH (ref 2.3–4.6)
Potassium: 4 mEq/L (ref 3.5–5.1)
Sodium: 138 mEq/L (ref 135–145)

## 2013-04-19 LAB — POCT HEMOGLOBIN-HEMACUE: Hemoglobin: 10.8 g/dL — ABNORMAL LOW (ref 12.0–15.0)

## 2013-04-19 LAB — IRON AND TIBC
Iron: 94 ug/dL (ref 42–135)
Saturation Ratios: 49 % (ref 20–55)
TIBC: 191 ug/dL — ABNORMAL LOW (ref 250–470)
UIBC: 97 ug/dL — ABNORMAL LOW (ref 125–400)

## 2013-04-19 LAB — FERRITIN: Ferritin: 775 ng/mL — ABNORMAL HIGH (ref 10–291)

## 2013-04-19 MED ORDER — EPOETIN ALFA 20000 UNIT/ML IJ SOLN
20000.0000 [IU] | INTRAMUSCULAR | Status: DC
  Administered 2013-04-19: 20000 [IU] via SUBCUTANEOUS

## 2013-04-19 MED ORDER — EPOETIN ALFA 20000 UNIT/ML IJ SOLN
INTRAMUSCULAR | Status: AC
  Filled 2013-04-19: qty 1

## 2013-04-20 LAB — PTH, INTACT AND CALCIUM
Calcium, Total (PTH): 8.8 mg/dL (ref 8.4–10.5)
PTH: 242.1 pg/mL — ABNORMAL HIGH (ref 14.0–72.0)

## 2013-05-03 ENCOUNTER — Encounter (HOSPITAL_COMMUNITY): Payer: BC Managed Care – PPO

## 2013-05-04 ENCOUNTER — Encounter (HOSPITAL_COMMUNITY)
Admission: RE | Admit: 2013-05-04 | Discharge: 2013-05-04 | Disposition: A | Discharge status: Home / Self Care | Payer: BC Managed Care – PPO | Source: Ambulatory Visit | Attending: Nephrology | Admitting: Nephrology

## 2013-05-04 DIAGNOSIS — N184 Chronic kidney disease, stage 4 (severe): Secondary | ICD-10-CM | POA: Insufficient documentation

## 2013-05-04 DIAGNOSIS — D638 Anemia in other chronic diseases classified elsewhere: Secondary | ICD-10-CM | POA: Insufficient documentation

## 2013-05-04 LAB — POCT HEMOGLOBIN-HEMACUE: Hemoglobin: 10.5 g/dL — ABNORMAL LOW (ref 12.0–15.0)

## 2013-05-04 MED ORDER — EPOETIN ALFA 20000 UNIT/ML IJ SOLN
INTRAMUSCULAR | Status: AC
  Filled 2013-05-04: qty 1

## 2013-05-04 MED ORDER — EPOETIN ALFA 20000 UNIT/ML IJ SOLN
20000.0000 [IU] | INTRAMUSCULAR | Status: DC
  Administered 2013-05-04: 20000 [IU] via SUBCUTANEOUS

## 2013-05-18 ENCOUNTER — Encounter (HOSPITAL_COMMUNITY)
Admission: RE | Admit: 2013-05-18 | Discharge: 2013-05-18 | Disposition: A | Discharge status: Home / Self Care | Payer: BC Managed Care – PPO | Source: Ambulatory Visit | Attending: Nephrology | Admitting: Nephrology

## 2013-05-18 LAB — FERRITIN: Ferritin: 615 ng/mL — ABNORMAL HIGH (ref 10–291)

## 2013-05-18 LAB — RENAL FUNCTION PANEL
Albumin: 3.3 g/dL — ABNORMAL LOW (ref 3.5–5.2)
BUN: 46 mg/dL — ABNORMAL HIGH (ref 6–23)
CO2: 23 mEq/L (ref 19–32)
Calcium: 9.2 mg/dL (ref 8.4–10.5)
Chloride: 99 mEq/L (ref 96–112)
Creatinine, Ser: 6.63 mg/dL — ABNORMAL HIGH (ref 0.50–1.10)
GFR calc Af Amer: 7 mL/min — ABNORMAL LOW (ref >90)
GFR calc non Af Amer: 6 mL/min — ABNORMAL LOW (ref >90)
Glucose, Bld: 49 mg/dL — ABNORMAL LOW (ref 70–99)
Phosphorus: 5.7 mg/dL — ABNORMAL HIGH (ref 2.3–4.6)
Potassium: 3.7 mEq/L (ref 3.5–5.1)
Sodium: 137 mEq/L (ref 135–145)

## 2013-05-18 LAB — IRON AND TIBC
Iron: 70 ug/dL (ref 42–135)
Saturation Ratios: 36 % (ref 20–55)
TIBC: 197 ug/dL — ABNORMAL LOW (ref 250–470)
UIBC: 127 ug/dL (ref 125–400)

## 2013-05-18 LAB — POCT HEMOGLOBIN-HEMACUE: Hemoglobin: 10.2 g/dL — ABNORMAL LOW (ref 12.0–15.0)

## 2013-05-18 MED ORDER — EPOETIN ALFA 20000 UNIT/ML IJ SOLN
INTRAMUSCULAR | Status: AC
  Administered 2013-05-18: 20000 [IU] via SUBCUTANEOUS
  Filled 2013-05-18: qty 1

## 2013-05-18 MED ORDER — EPOETIN ALFA 20000 UNIT/ML IJ SOLN: 20000.0000 [IU] | INTRAMUSCULAR | Status: DC

## 2013-05-19 LAB — PTH, INTACT AND CALCIUM
Calcium, Total (PTH): 8.8 mg/dL (ref 8.4–10.5)
PTH: 255.1 pg/mL — ABNORMAL HIGH (ref 14.0–72.0)

## 2013-06-01 ENCOUNTER — Encounter (HOSPITAL_COMMUNITY)
Admission: RE | Admit: 2013-06-01 | Discharge: 2013-06-01 | Disposition: A | Discharge status: Home / Self Care | Payer: BC Managed Care – PPO | Source: Ambulatory Visit | Attending: Nephrology | Admitting: Nephrology

## 2013-06-01 DIAGNOSIS — N184 Chronic kidney disease, stage 4 (severe): Secondary | ICD-10-CM | POA: Insufficient documentation

## 2013-06-01 DIAGNOSIS — D638 Anemia in other chronic diseases classified elsewhere: Secondary | ICD-10-CM | POA: Insufficient documentation

## 2013-06-01 LAB — POCT HEMOGLOBIN-HEMACUE: Hemoglobin: 9.5 g/dL — ABNORMAL LOW (ref 12.0–15.0)

## 2013-06-01 MED ORDER — EPOETIN ALFA 10000 UNIT/ML IJ SOLN
INTRAMUSCULAR | Status: AC
  Filled 2013-06-01: qty 1

## 2013-06-01 MED ORDER — EPOETIN ALFA 20000 UNIT/ML IJ SOLN
INTRAMUSCULAR | Status: AC
  Filled 2013-06-01: qty 1

## 2013-06-01 MED ORDER — EPOETIN ALFA 20000 UNIT/ML IJ SOLN
20000.0000 [IU] | INTRAMUSCULAR | Status: DC
  Administered 2013-06-01: 20000 [IU] via SUBCUTANEOUS

## 2013-06-13 ENCOUNTER — Other Ambulatory Visit: Payer: Self-pay | Admitting: Internal Medicine

## 2013-06-14 ENCOUNTER — Other Ambulatory Visit: Payer: Self-pay | Admitting: Internal Medicine

## 2013-06-15 ENCOUNTER — Encounter (HOSPITAL_COMMUNITY)
Admission: RE | Admit: 2013-06-15 | Discharge: 2013-06-15 | Disposition: A | Discharge status: Home / Self Care | Payer: BC Managed Care – PPO | Source: Ambulatory Visit | Attending: Nephrology | Admitting: Nephrology

## 2013-06-15 LAB — POCT HEMOGLOBIN-HEMACUE: Hemoglobin: 10.9 g/dL — ABNORMAL LOW (ref 12.0–15.0)

## 2013-06-15 LAB — RENAL FUNCTION PANEL
Albumin: 3.5 g/dL (ref 3.5–5.2)
BUN: 58 mg/dL — ABNORMAL HIGH (ref 6–23)
CO2: 21 mEq/L (ref 19–32)
Calcium: 9.1 mg/dL (ref 8.4–10.5)
Chloride: 95 mEq/L — ABNORMAL LOW (ref 96–112)
Creatinine, Ser: 7.53 mg/dL — ABNORMAL HIGH (ref 0.50–1.10)
GFR calc Af Amer: 6 mL/min — ABNORMAL LOW (ref >90)
GFR calc non Af Amer: 5 mL/min — ABNORMAL LOW (ref >90)
Glucose, Bld: 218 mg/dL — ABNORMAL HIGH (ref 70–99)
Phosphorus: 6.4 mg/dL — ABNORMAL HIGH (ref 2.3–4.6)
Potassium: 4.2 mEq/L (ref 3.5–5.1)
Sodium: 134 mEq/L — ABNORMAL LOW (ref 135–145)

## 2013-06-15 LAB — IRON AND TIBC
Iron: 71 ug/dL (ref 42–135)
Saturation Ratios: 34 % (ref 20–55)
TIBC: 209 ug/dL — ABNORMAL LOW (ref 250–470)
UIBC: 138 ug/dL (ref 125–400)

## 2013-06-15 LAB — FERRITIN: Ferritin: 696 ng/mL — ABNORMAL HIGH (ref 10–291)

## 2013-06-15 MED ORDER — EPOETIN ALFA 20000 UNIT/ML IJ SOLN
INTRAMUSCULAR | Status: AC
  Administered 2013-06-15: 20000 [IU] via SUBCUTANEOUS
  Filled 2013-06-15: qty 1

## 2013-06-15 MED ORDER — EPOETIN ALFA 20000 UNIT/ML IJ SOLN
20000.0000 [IU] | INTRAMUSCULAR | Status: DC
  Administered 2013-06-15: 20000 [IU] via SUBCUTANEOUS

## 2013-06-16 LAB — PTH, INTACT AND CALCIUM
Calcium, Total (PTH): 8.9 mg/dL (ref 8.4–10.5)
PTH: 245.1 pg/mL — ABNORMAL HIGH (ref 14.0–72.0)

## 2013-06-23 ENCOUNTER — Ambulatory Visit: Payer: BC Managed Care – PPO | Admitting: Cardiovascular Disease

## 2013-06-23 ENCOUNTER — Other Ambulatory Visit: Payer: Self-pay | Admitting: *Deleted

## 2013-06-23 DIAGNOSIS — N186 End stage renal disease: Secondary | ICD-10-CM

## 2013-06-23 DIAGNOSIS — Z01810 Encounter for preprocedural cardiovascular examination: Secondary | ICD-10-CM

## 2013-06-28 ENCOUNTER — Other Ambulatory Visit (HOSPITAL_COMMUNITY): Payer: Self-pay | Admitting: *Deleted

## 2013-06-29 ENCOUNTER — Encounter (HOSPITAL_COMMUNITY)
Admission: RE | Admit: 2013-06-29 | Discharge: 2013-06-29 | Disposition: A | Discharge status: Home / Self Care | Payer: BC Managed Care – PPO | Source: Ambulatory Visit | Attending: Nephrology | Admitting: Nephrology

## 2013-06-29 ENCOUNTER — Other Ambulatory Visit: Payer: Self-pay | Admitting: Internal Medicine

## 2013-06-29 LAB — POCT HEMOGLOBIN-HEMACUE: Hemoglobin: 10.7 g/dL — ABNORMAL LOW (ref 12.0–15.0)

## 2013-06-29 MED ORDER — EPOETIN ALFA 20000 UNIT/ML IJ SOLN
INTRAMUSCULAR | Status: AC
  Filled 2013-06-29: qty 1

## 2013-06-29 MED ORDER — EPOETIN ALFA 20000 UNIT/ML IJ SOLN
20000.0000 [IU] | INTRAMUSCULAR | Status: DC
  Administered 2013-06-29: 20000 [IU] via SUBCUTANEOUS

## 2013-07-03 ENCOUNTER — Ambulatory Visit: Payer: BC Managed Care – PPO | Admitting: Cardiovascular Disease

## 2013-07-04 ENCOUNTER — Encounter: Payer: Self-pay | Admitting: Vascular Surgery

## 2013-07-05 ENCOUNTER — Inpatient Hospital Stay (HOSPITAL_COMMUNITY): Admission: RE | Admit: 2013-07-05 | Payer: BC Managed Care – PPO | Source: Ambulatory Visit

## 2013-07-05 ENCOUNTER — Ambulatory Visit: Payer: BC Managed Care – PPO | Admitting: Vascular Surgery

## 2013-07-06 ENCOUNTER — Ambulatory Visit: Payer: BC Managed Care – PPO | Admitting: Cardiovascular Disease

## 2013-07-13 ENCOUNTER — Encounter (HOSPITAL_COMMUNITY)
Admission: RE | Admit: 2013-07-13 | Discharge: 2013-07-13 | Disposition: A | Discharge status: Home / Self Care | Payer: BC Managed Care – PPO | Source: Ambulatory Visit | Attending: Nephrology | Admitting: Nephrology

## 2013-07-13 DIAGNOSIS — D638 Anemia in other chronic diseases classified elsewhere: Secondary | ICD-10-CM | POA: Insufficient documentation

## 2013-07-13 DIAGNOSIS — N184 Chronic kidney disease, stage 4 (severe): Secondary | ICD-10-CM | POA: Insufficient documentation

## 2013-07-13 LAB — RENAL FUNCTION PANEL
Albumin: 3.5 g/dL (ref 3.5–5.2)
BUN: 53 mg/dL — ABNORMAL HIGH (ref 6–23)
CO2: 24 mEq/L (ref 19–32)
Calcium: 9.1 mg/dL (ref 8.4–10.5)
Chloride: 99 mEq/L (ref 96–112)
Creatinine, Ser: 6.83 mg/dL — ABNORMAL HIGH (ref 0.50–1.10)
GFR calc Af Amer: 7 mL/min — ABNORMAL LOW (ref >90)
GFR calc non Af Amer: 6 mL/min — ABNORMAL LOW (ref >90)
Glucose, Bld: 188 mg/dL — ABNORMAL HIGH (ref 70–99)
Phosphorus: 6.8 mg/dL — ABNORMAL HIGH (ref 2.3–4.6)
Potassium: 3.8 mEq/L (ref 3.5–5.1)
Sodium: 137 mEq/L (ref 135–145)

## 2013-07-13 LAB — IRON AND TIBC
Iron: 43 ug/dL (ref 42–135)
Saturation Ratios: 21 % (ref 20–55)
TIBC: 205 ug/dL — ABNORMAL LOW (ref 250–470)
UIBC: 162 ug/dL (ref 125–400)

## 2013-07-13 LAB — POCT HEMOGLOBIN-HEMACUE: Hemoglobin: 11.1 g/dL — ABNORMAL LOW (ref 12.0–15.0)

## 2013-07-13 LAB — FERRITIN: Ferritin: 725 ng/mL — ABNORMAL HIGH (ref 10–291)

## 2013-07-13 MED ORDER — EPOETIN ALFA 20000 UNIT/ML IJ SOLN
INTRAMUSCULAR | Status: AC
  Filled 2013-07-13: qty 1

## 2013-07-13 MED ORDER — EPOETIN ALFA 20000 UNIT/ML IJ SOLN
20000.0000 [IU] | INTRAMUSCULAR | Status: DC
  Administered 2013-07-13: 20000 [IU] via SUBCUTANEOUS

## 2013-07-14 LAB — PTH, INTACT AND CALCIUM
Calcium, Total (PTH): 9.1 mg/dL (ref 8.4–10.5)
PTH: 238.6 pg/mL — ABNORMAL HIGH (ref 14.0–72.0)

## 2013-07-25 ENCOUNTER — Ambulatory Visit: Payer: BC Managed Care – PPO | Admitting: Cardiovascular Disease

## 2013-07-28 ENCOUNTER — Encounter (HOSPITAL_COMMUNITY)
Admission: RE | Admit: 2013-07-28 | Discharge: 2013-07-28 | Disposition: A | Discharge status: Home / Self Care | Payer: BC Managed Care – PPO | Source: Ambulatory Visit | Attending: Nephrology | Admitting: Nephrology

## 2013-07-28 LAB — POCT HEMOGLOBIN-HEMACUE: Hemoglobin: 10.9 g/dL — ABNORMAL LOW (ref 12.0–15.0)

## 2013-07-28 MED ORDER — EPOETIN ALFA 20000 UNIT/ML IJ SOLN
20000.0000 [IU] | INTRAMUSCULAR | Status: DC
  Administered 2013-07-28: 20000 [IU] via SUBCUTANEOUS

## 2013-07-28 MED ORDER — EPOETIN ALFA 20000 UNIT/ML IJ SOLN
INTRAMUSCULAR | Status: AC
  Filled 2013-07-28: qty 1

## 2013-08-10 ENCOUNTER — Encounter: Payer: Self-pay | Admitting: Internal Medicine

## 2013-08-10 LAB — HM DIABETES EYE EXAM

## 2013-08-11 ENCOUNTER — Inpatient Hospital Stay (HOSPITAL_COMMUNITY): Admission: RE | Admit: 2013-08-11 | Payer: BC Managed Care – PPO | Source: Ambulatory Visit

## 2013-08-11 ENCOUNTER — Encounter (HOSPITAL_COMMUNITY)
Admission: RE | Admit: 2013-08-11 | Discharge: 2013-08-11 | Disposition: A | Discharge status: Home / Self Care | Payer: BC Managed Care – PPO | Source: Ambulatory Visit | Attending: Nephrology | Admitting: Nephrology

## 2013-08-11 DIAGNOSIS — N184 Chronic kidney disease, stage 4 (severe): Secondary | ICD-10-CM | POA: Insufficient documentation

## 2013-08-11 DIAGNOSIS — D638 Anemia in other chronic diseases classified elsewhere: Secondary | ICD-10-CM | POA: Insufficient documentation

## 2013-08-11 LAB — RENAL FUNCTION PANEL
Albumin: 3.4 g/dL — ABNORMAL LOW (ref 3.5–5.2)
BUN: 47 mg/dL — ABNORMAL HIGH (ref 6–23)
CO2: 25 mEq/L (ref 19–32)
Calcium: 9 mg/dL (ref 8.4–10.5)
Chloride: 101 mEq/L (ref 96–112)
Creatinine, Ser: 6.78 mg/dL — ABNORMAL HIGH (ref 0.50–1.10)
GFR calc Af Amer: 7 mL/min — ABNORMAL LOW (ref >90)
GFR calc non Af Amer: 6 mL/min — ABNORMAL LOW (ref >90)
Glucose, Bld: 294 mg/dL — ABNORMAL HIGH (ref 70–99)
Phosphorus: 5.6 mg/dL — ABNORMAL HIGH (ref 2.3–4.6)
Potassium: 4 mEq/L (ref 3.5–5.1)
Sodium: 139 mEq/L (ref 135–145)

## 2013-08-11 LAB — IRON AND TIBC
Iron: 63 ug/dL (ref 42–135)
Saturation Ratios: 31 % (ref 20–55)
TIBC: 206 ug/dL — ABNORMAL LOW (ref 250–470)
UIBC: 143 ug/dL (ref 125–400)

## 2013-08-11 LAB — FERRITIN: Ferritin: 717 ng/mL — ABNORMAL HIGH (ref 10–291)

## 2013-08-11 LAB — POCT HEMOGLOBIN-HEMACUE: Hemoglobin: 11 g/dL — ABNORMAL LOW (ref 12.0–15.0)

## 2013-08-11 MED ORDER — EPOETIN ALFA 20000 UNIT/ML IJ SOLN
20000.0000 [IU] | INTRAMUSCULAR | Status: DC
  Administered 2013-08-11: 20000 [IU] via SUBCUTANEOUS

## 2013-08-11 MED ORDER — EPOETIN ALFA 20000 UNIT/ML IJ SOLN
INTRAMUSCULAR | Status: AC
  Filled 2013-08-11: qty 1

## 2013-08-14 LAB — PTH, INTACT AND CALCIUM
Calcium, Total (PTH): 8.8 mg/dL (ref 8.4–10.5)
PTH: 271.2 pg/mL — ABNORMAL HIGH (ref 14.0–72.0)

## 2013-08-28 ENCOUNTER — Encounter (HOSPITAL_COMMUNITY)
Admission: RE | Admit: 2013-08-28 | Discharge: 2013-08-28 | Disposition: A | Discharge status: Home / Self Care | Payer: BC Managed Care – PPO | Source: Ambulatory Visit | Attending: Nephrology | Admitting: Nephrology

## 2013-08-28 LAB — POCT HEMOGLOBIN-HEMACUE: Hemoglobin: 10.7 g/dL — ABNORMAL LOW (ref 12.0–15.0)

## 2013-08-28 MED ORDER — EPOETIN ALFA 20000 UNIT/ML IJ SOLN
INTRAMUSCULAR | Status: AC
  Administered 2013-08-28: 20000 [IU] via SUBCUTANEOUS
  Filled 2013-08-28: qty 1

## 2013-08-28 MED ORDER — EPOETIN ALFA 20000 UNIT/ML IJ SOLN
20000.0000 [IU] | INTRAMUSCULAR | Status: DC
  Administered 2013-08-28: 20000 [IU] via SUBCUTANEOUS

## 2013-09-06 ENCOUNTER — Encounter: Payer: Self-pay | Admitting: Internal Medicine

## 2013-09-06 ENCOUNTER — Ambulatory Visit (INDEPENDENT_AMBULATORY_CARE_PROVIDER_SITE_OTHER): Payer: BC Managed Care – PPO | Admitting: Internal Medicine

## 2013-09-06 VITALS — BP 138/66 | HR 81 | Temp 98.0°F | Resp 16 | Wt 130.1 lb

## 2013-09-06 DIAGNOSIS — M79676 Pain in unspecified toe(s): Secondary | ICD-10-CM | POA: Insufficient documentation

## 2013-09-06 DIAGNOSIS — M79609 Pain in unspecified limb: Secondary | ICD-10-CM

## 2013-09-06 DIAGNOSIS — L84 Corns and callosities: Secondary | ICD-10-CM

## 2013-09-06 DIAGNOSIS — I10 Essential (primary) hypertension: Secondary | ICD-10-CM

## 2013-09-06 DIAGNOSIS — M79606 Pain in leg, unspecified: Secondary | ICD-10-CM | POA: Insufficient documentation

## 2013-09-06 DIAGNOSIS — M79674 Pain in right toe(s): Secondary | ICD-10-CM

## 2013-09-06 MED ORDER — DOXYCYCLINE HYCLATE 100 MG PO TABS: 100.0000 mg | ORAL_TABLET | Freq: Two times a day (BID) | ORAL | Status: DC

## 2013-09-06 NOTE — Assessment & Plan Note (Signed)
?   Early cellulitis as well - for doxy course

## 2013-09-06 NOTE — Progress Notes (Signed)
Subjective:    Patient ID: [name], female    DOB: [birth year], 63 y.o.   MRN: [id number]  HPI  Here to f/u; overall doing ok,  Pt denies chest pain, increased sob or doe, wheezing, orthopnea, PND, increased LE swelling, palpitations, dizziness or syncope.  Pt denies polydipsia, polyuria, or low sugar symptoms such as weakness or confusion improved with po intake.  Pt denies new neurological symptoms such as new headache, or facial or extremity weakness or numbness.   Pt states overall good compliance with meds, has been trying to follow lower cholesterol, diabetic diet, with wt overall stable,  but little exercise however.Plans to re-sched with Dr Gwenlyn Found for LE circulation eval after recently saw podiatry, not seen yet due to cost/copay. But since last seen podiatry c/o 2 mo worsening tender callous area to medial left ankle/lateral malleolus, as well as lateral/distal right little toe.  No frank ulcerations, but tender and mild red to right little toe in last few days as well Past Medical History  Diagnosis Date  . DIABETES MELLITUS, UNCONTROLLED 05/21/2009  . HYPERLIPIDEMIA 03/30/2007  . ANEMIA-IRON DEFICIENCY 01/25/2008  . HYPERTENSION 01/25/2008  . SINUSITIS- ACUTE-NOS 01/25/2008  . SECONDARY HYPERPARATHYROIDISM 05/21/2009  . RENAL INSUFFICIENCY 02/01/2008  . CERVICAL RADICULOPATHY, LEFT 01/25/2008  . BACK PAIN 05/02/2009  . NUMBNESS 05/21/2009  . Allergic rhinitis, cause unspecified 12/08/2010   Past Surgical History  Procedure Laterality Date  . Av fistula placement    . Eye surgery      reports that she has never smoked. She has never used smokeless tobacco. She reports that she does not drink alcohol or use illicit drugs. family history includes Cancer in her mother. Allergies  Allergen Reactions  . Oxycodone Nausea And Vomiting   Current Outpatient Prescriptions on File Prior to Visit  Medication Sig Dispense Refill  . amLODipine (NORVASC) 2.5 MG tablet TAKE ONE TABLET BY MOUTH  TWICE DAILY  60 tablet  11  . aspirin 81 MG EC tablet Take 81 mg by mouth daily.        [name] glimepiride (AMARYL) 4 MG tablet TAKE ONE TABLET BY MOUTH TWICE DAILY  180 tablet  3  . glucose blood (ONE TOUCH ULTRA TEST) test strip 1 each. Use as directed two times a day       . HYDROcodone-acetaminophen (NORCO/VICODIN) 5-325 MG per tablet Take 1 tablet by mouth every 6 (six) hours as needed for pain.  60 tablet  0  . insulin glargine (LANTUS SOLOSTAR) 100 UNIT/ML injection Inject 55 Units into the skin daily.  15 mL  11  . JANUVIA 100 MG tablet TAKE ONE TABLET BY MOUTH ONCE DAILY  90 tablet  3  . LANTUS SOLOSTAR 100 UNIT/ML SOPN INJECT 55 UNITS SUBCUTANEOUSLY EVERY DAY  15 mL  6  . ONE TOUCH LANCETS MISC Use as directed two times a day       . sitaGLIPtin (JANUVIA) 100 MG tablet Take 1 tablet (100 mg total) by mouth daily.  90 tablet  3  . carvedilol (COREG) 25 MG tablet TAKE ONE TABLET BY MOUTH TWICE DAILY  60 tablet  11  . fexofenadine (ALLEGRA) 180 MG tablet Take 1 tablet (180 mg total) by mouth daily.  30 tablet  2  . furosemide (LASIX) 40 MG tablet TAKE ONE TABLET BY MOUTH TWICE DAILY  180 tablet  3  . pantoprazole (PROTONIX) 40 MG tablet Take 1 tablet (40 mg total) by mouth daily.  30 tablet  11  . simvastatin (ZOCOR) 20 MG tablet Take 1 tablet (20 mg total) by mouth at bedtime.  90 tablet  3   No current facility-administered medications on file prior to visit.   Review of Systems  Constitutional: Negative for unexpected weight change, or unusual diaphoresis  HENT: Negative for tinnitus.   Eyes: Negative for photophobia and visual disturbance.  Respiratory: Negative for choking and stridor.   Gastrointestinal: Negative for vomiting and blood in stool.  Genitourinary: Negative for hematuria and decreased urine volume.  Musculoskeletal: Negative for acute joint swelling Skin: Negative for color change and wound.  Neurological: Negative for tremors and numbness other than noted    Psychiatric/Behavioral: Negative for decreased concentration or  hyperactivity.       Objective:   Physical Exam BP 138/66  Pulse 81  Temp(Src) 98 F (36.7 C) (Oral)  Resp 16  Wt 130 lb 1.3 oz (59.004 kg)  SpO2 97% VS noted, not ill appearing Constitutional: Pt appears well-developed and well-nourished.  HENT: Head: NCAT.  Right Ear: External ear normal.  Left Ear: External ear normal.  Eyes: Conjunctivae and EOM are normal. Pupils are equal, round, and reactive to light.  Neck: Normal range of motion. Neck supple.  Cardiovascular: Normal rate and regular rhythm.   Pulmonary/Chest: Effort normal and breath sounds without rales or wheezing Neurological: Pt is alert. Not confused  Skin: no ulcerations, but appears to have tender precallouses signficant to right lateral 5th toe (with mild erythema) as well larger area medial right ankle Psychiatric: Pt behavior is normal. Thought content normal.        Assessment & Plan:

## 2013-09-06 NOTE — Progress Notes (Signed)
Pre-visit discussion using our clinic review tool. No additional management support is needed unless otherwise documented below in the visit note.  

## 2013-09-06 NOTE — Patient Instructions (Signed)
Please take all new medication as prescribed - the doxycycline Please continue all other medications as before, including the pravastatin every day You will be contacted regarding the referral for: Dr Gwenlyn Found for circulation, abd [place] for the ankle and toe pain and callouses  Please remember to sign up for My Chart if you have not done so, as this will be important to you in the future with finding out test results, communicating by private email, and scheduling acute appointments online when needed.  Please keep your appointments with your specialists as you have planned - kidney  Please return in 6 months, or sooner if needed

## 2013-09-06 NOTE — Assessment & Plan Note (Signed)
stable overall by history and exam, recent data reviewed with pt, and pt to continue medical treatment as before,  to f/u any worsening symptoms or concerns BP Readings from Last 3 Encounters:  09/06/13 138/66  08/28/13 159/71  08/11/13 178/65

## 2013-09-06 NOTE — Assessment & Plan Note (Signed)
?   Claudication - will assist with referral to Dr Olevia Perches as was planned, pt now states she will likely be able to afford the copay ($70)

## 2013-09-06 NOTE — Assessment & Plan Note (Signed)
Likely needs DM shoes, for podiatry referral

## 2013-09-08 ENCOUNTER — Ambulatory Visit (INDEPENDENT_AMBULATORY_CARE_PROVIDER_SITE_OTHER): Payer: BC Managed Care – PPO | Admitting: Cardiovascular Disease

## 2013-09-08 ENCOUNTER — Encounter: Payer: Self-pay | Admitting: Cardiovascular Disease

## 2013-09-08 VITALS — BP 158/80 | HR 86 | Ht 60.0 in | Wt 132.0 lb

## 2013-09-08 DIAGNOSIS — I739 Peripheral vascular disease, unspecified: Secondary | ICD-10-CM

## 2013-09-08 NOTE — Patient Instructions (Signed)
  We will see you back in follow up in 2 months with Dr Gwenlyn Found  Dr Gwenlyn Found has ordered lower extremity arterial dopplers

## 2013-09-08 NOTE — Progress Notes (Signed)
Patient ID: Carolyn Valenzuela, female   DOB: 10/13/1950, 63 y.o.   MRN: [id number]  Chief Complaint  Patient presents with  . New Evaluation    lower extremity evaluation, referred by Dr Cathlean Cower    HPI Carolyn Valenzuela is a 63 y.o. female with h/o type 2 diabetes, hyperlipidemia, Hypertension, chronic kidney disease stage 4, not on dialysis who presents as new patient for evaluation of peripheral vascular disease.   Patient reports that she has been having some discomfort in the lateral aspects of her right and left toes. Pain is present both at rest and with activity, has been ongoing for 4-5 months. It is worst with touch. She also has a chronic wound on her right malleolus for which she is seen by her podiatrist, Dr. Elby Showers. She otherwise denies any pain in her calves or legs with ambulation.  She denies any shortness of breath, chest pain, recent lower extremity swelling or palpitations.  She works at [employer] at [employer] and is on her feet all day. She doesn't exercise on a regular basis.  She has never smoked. She denies any history of heart disease and any family history of ischemic heart disease.  Past Medical History  Diagnosis Date  . DIABETES MELLITUS, UNCONTROLLED 05/21/2009  . HYPERLIPIDEMIA 03/30/2007  . ANEMIA-IRON DEFICIENCY 01/25/2008  . HYPERTENSION 01/25/2008  . SINUSITIS- ACUTE-NOS 01/25/2008  . SECONDARY HYPERPARATHYROIDISM 05/21/2009  . RENAL INSUFFICIENCY 02/01/2008  . CERVICAL RADICULOPATHY, LEFT 01/25/2008  . BACK PAIN 05/02/2009  . NUMBNESS 05/21/2009  . Allergic rhinitis, cause unspecified 12/08/2010    Past Surgical History  Procedure Laterality Date  . Av fistula placement    . Eye surgery      Family History  Problem Relation Age of Onset  . Cancer Mother     Pancreatic    Social History History  Substance Use Topics  . Smoking status: Never Smoker   . Smokeless tobacco: Never Used  . Alcohol Use: No   She lives alone. She is  not married and doesn't have any children.  Allergies  Allergen Reactions  . Oxycodone Nausea And Vomiting    Current Outpatient Prescriptions  Medication Sig Dispense Refill  . amLODipine (NORVASC) 2.5 MG tablet TAKE ONE TABLET BY MOUTH TWICE DAILY  60 tablet  11  . aspirin 81 MG EC tablet Take 81 mg by mouth daily.        . calcitRIOL (ROCALTROL) 0.5 MCG capsule       . carvedilol (COREG) 25 MG tablet TAKE ONE TABLET BY MOUTH TWICE DAILY  60 tablet  11  . doxycycline (VIBRA-TABS) 100 MG tablet Take 1 tablet (100 mg total) by mouth 2 (two) times daily.  20 tablet  0  . furosemide (LASIX) 40 MG tablet TAKE ONE TABLET BY MOUTH TWICE DAILY  180 tablet  3  . glimepiride (AMARYL) 4 MG tablet TAKE ONE TABLET BY MOUTH TWICE DAILY  180 tablet  3  . glucose blood (ONE TOUCH ULTRA TEST) test strip 1 each. Use as directed two times a day       . HYDROcodone-acetaminophen (NORCO/VICODIN) 5-325 MG per tablet Take 1 tablet by mouth every 6 (six) hours as needed for pain.  60 tablet  0  . JANUVIA 100 MG tablet TAKE ONE TABLET BY MOUTH ONCE DAILY  90 tablet  3  . LANTUS SOLOSTAR 100 UNIT/ML SOPN INJECT 55 UNITS SUBCUTANEOUSLY EVERY DAY  15 mL  6  . ONE  TOUCH LANCETS MISC Use as directed two times a day       . simvastatin (ZOCOR) 20 MG tablet Take 1 tablet (20 mg total) by mouth at bedtime.  90 tablet  3  . fexofenadine (ALLEGRA) 180 MG tablet Take 1 tablet (180 mg total) by mouth daily.  30 tablet  2  . pantoprazole (PROTONIX) 40 MG tablet Take 1 tablet (40 mg total) by mouth daily.  30 tablet  11   No current facility-administered medications for this visit.    Review of Systems Review of Systems Negative except per HPI Blood pressure 158/80, pulse 86, height 5' (1.524 m), weight 132 lb (59.875 kg).  Physical Exam Physical Exam General: well appearing, in no acute distress, pleasant African-American woman HEENT: PERRLA, EOMI, no JVD, no carotid murmur appreciated CV: S1S2, RRR, no murmur or  gallop appreciated Abdomen: soft, non tender, non distended, no abdominal bruit appreciated 2+ femoral pulses bilaterally, popliteal pulses difficult to appreciate, 1+ dorsalis pedis and posterior tibial pulses bilaterally Pulm: Clear to auscultation bilaterally Extr: no pedal edema, warm lower extremities, chronic appearing eschar on right medial malleolus Data Reviewed Lipid Panel     Component Value Date/Time   CHOL 197 06/08/2012 1631   TRIG 218.0* 06/08/2012 1631   HDL 38.00* 06/08/2012 1631   CHOLHDL 5 06/08/2012 1631   VLDL 43.6* 06/08/2012 1631   LDLCALC 101* 12/08/2010 1356    CMP     Component Value Date/Time   NA 139 08/11/2013 1530   K 4.0 08/11/2013 1530   CL 101 08/11/2013 1530   CO2 25 08/11/2013 1530   GLUCOSE 294* 08/11/2013 1530   BUN 47* 08/11/2013 1530   CREATININE 6.78* 08/11/2013 1530   CALCIUM 8.8 08/11/2013 1530   CALCIUM 9.0 08/11/2013 1530   PROT 7.5 06/08/2012 1631   ALBUMIN 3.4* 08/11/2013 1530   AST 17 06/08/2012 1631   ALT 11 06/08/2012 1631   ALKPHOS 66 06/08/2012 1631   BILITOT 0.5 06/08/2012 1631   GFRNONAA 6* 08/11/2013 1530   GFRAA 7* 08/11/2013 1530    Assessment/Plan:      63 y.o. female with h/o type 2 diabetes, hyperlipidemia, Hypertension, chronic kidney disease stage 4, not on dialysis who presents as new patient for evaluation of peripheral vascular disease.   1. Peripheral vascular disease evaluation in diabetic patient:  No current symptoms of claudication per patient. Distal pulses present on exam which is reassuring. In setting of CKD4, angiogram is not an option at this time and not indicated.  - obtain lower extremity arterial doppler  - follow up with Dr. Gwenlyn Found in 2 months to review results  2. Type 2 diabetes: followed by Dr. Jenny Reichmann with most recent A1C of 11.1 on 06/08/12. - continue current therapy and follow up with PCP 3. Hyperlipidemia: total chol: 197, trig:218, LDL not measured on 05/2012 - continue simvastatin 20mg  daily -  follow up with PCP for repeat labs 4. CKD4: followed by Dr. Moshe Cipro and being evaluated for vascular access for dialysis. Had trial of fistula placement which recently failed.  5. Hypertension:elevated in office.  - continue current therapy coreg and lasix until follow up with Dr. Jenny Reichmann.         Liam Graham 09/08/2013, 11:39 AM Family Medicine Resident, PGY-3  I have seen, examined and assessed the patient with Dr. Concha Se and agree with her findings.patient has end-stage renal disease and a small ulcer on her right medial malleolus. I can't feel pedal pulses. At this point  I'm going to get arterial Doppler studies but did not feel that she is a candidate for angiography or intervention given her degree of renal failure. We'll continue to monitor her closely and conservatively.  Lorretta Harp, M.D., [place], [facility], [place] [address] [place], [place]  [address]  [phone] 09/08/2013 4:13 PM

## 2013-09-11 ENCOUNTER — Encounter (HOSPITAL_COMMUNITY): Payer: BC Managed Care – PPO

## 2013-09-11 ENCOUNTER — Telehealth (HOSPITAL_COMMUNITY): Payer: Self-pay | Admitting: *Deleted

## 2013-09-11 ENCOUNTER — Encounter (HOSPITAL_COMMUNITY)
Admission: RE | Admit: 2013-09-11 | Discharge: 2013-09-11 | Disposition: A | Discharge status: Home / Self Care | Payer: BC Managed Care – PPO | Source: Ambulatory Visit | Attending: Nephrology | Admitting: Nephrology

## 2013-09-11 DIAGNOSIS — D638 Anemia in other chronic diseases classified elsewhere: Secondary | ICD-10-CM | POA: Insufficient documentation

## 2013-09-11 DIAGNOSIS — N184 Chronic kidney disease, stage 4 (severe): Secondary | ICD-10-CM | POA: Insufficient documentation

## 2013-09-11 LAB — IRON AND TIBC
Iron: 56 ug/dL (ref 42–135)
Saturation Ratios: 30 % (ref 20–55)
TIBC: 184 ug/dL — ABNORMAL LOW (ref 250–470)
UIBC: 128 ug/dL (ref 125–400)

## 2013-09-11 LAB — RENAL FUNCTION PANEL
Albumin: 3.6 g/dL (ref 3.5–5.2)
BUN: 75 mg/dL — ABNORMAL HIGH (ref 6–23)
CO2: 22 mEq/L (ref 19–32)
Calcium: 9.8 mg/dL (ref 8.4–10.5)
Chloride: 95 mEq/L — ABNORMAL LOW (ref 96–112)
Creatinine, Ser: 6.84 mg/dL — ABNORMAL HIGH (ref 0.50–1.10)
GFR calc Af Amer: 7 mL/min — ABNORMAL LOW (ref >90)
GFR calc non Af Amer: 6 mL/min — ABNORMAL LOW (ref >90)
Glucose, Bld: 240 mg/dL — ABNORMAL HIGH (ref 70–99)
Phosphorus: 7.2 mg/dL — ABNORMAL HIGH (ref 2.3–4.6)
Potassium: 5.1 mEq/L (ref 3.7–5.3)
Sodium: 137 mEq/L (ref 137–147)

## 2013-09-11 LAB — POCT HEMOGLOBIN-HEMACUE: Hemoglobin: 11.4 g/dL — ABNORMAL LOW (ref 12.0–15.0)

## 2013-09-11 LAB — FERRITIN: Ferritin: 583 ng/mL — ABNORMAL HIGH (ref 10–291)

## 2013-09-11 MED ORDER — EPOETIN ALFA 20000 UNIT/ML IJ SOLN: 20000.0000 [IU] | INTRAMUSCULAR | Status: DC

## 2013-09-11 MED ORDER — EPOETIN ALFA 20000 UNIT/ML IJ SOLN
INTRAMUSCULAR | Status: AC
  Administered 2013-09-11: 20000 [IU] via SUBCUTANEOUS
  Filled 2013-09-11: qty 1

## 2013-09-12 LAB — PTH, INTACT AND CALCIUM
Calcium, Total (PTH): 9.3 mg/dL (ref 8.4–10.5)
PTH: 172.9 pg/mL — ABNORMAL HIGH (ref 14.0–72.0)

## 2013-09-18 ENCOUNTER — Telehealth (HOSPITAL_COMMUNITY): Payer: Self-pay | Admitting: *Deleted

## 2013-09-20 ENCOUNTER — Ambulatory Visit (HOSPITAL_COMMUNITY)
Admission: RE | Admit: 2013-09-20 | Discharge: 2013-09-20 | Disposition: A | Discharge status: Home / Self Care | Payer: BC Managed Care – PPO | Source: Ambulatory Visit | Attending: Cardiovascular Disease | Admitting: Cardiovascular Disease

## 2013-09-20 DIAGNOSIS — L97509 Non-pressure chronic ulcer of other part of unspecified foot with unspecified severity: Secondary | ICD-10-CM

## 2013-09-20 DIAGNOSIS — I70209 Unspecified atherosclerosis of native arteries of extremities, unspecified extremity: Secondary | ICD-10-CM | POA: Insufficient documentation

## 2013-09-20 DIAGNOSIS — I739 Peripheral vascular disease, unspecified: Secondary | ICD-10-CM

## 2013-09-20 DIAGNOSIS — E119 Type 2 diabetes mellitus without complications: Secondary | ICD-10-CM | POA: Insufficient documentation

## 2013-09-20 NOTE — Progress Notes (Signed)
Arterial Lower Ext. Duplex Completed. Carolyn Valenzuela, BS, RDMS, RVT  

## 2013-09-25 ENCOUNTER — Encounter (HOSPITAL_COMMUNITY)
Admission: RE | Admit: 2013-09-25 | Discharge: 2013-09-25 | Disposition: A | Discharge status: Home / Self Care | Payer: BC Managed Care – PPO | Source: Ambulatory Visit | Attending: Nephrology | Admitting: Nephrology

## 2013-09-25 DIAGNOSIS — E7800 Pure hypercholesterolemia, unspecified: Secondary | ICD-10-CM | POA: Insufficient documentation

## 2013-09-25 DIAGNOSIS — Z79899 Other long term (current) drug therapy: Secondary | ICD-10-CM | POA: Insufficient documentation

## 2013-09-25 DIAGNOSIS — D631 Anemia in chronic kidney disease: Secondary | ICD-10-CM | POA: Insufficient documentation

## 2013-09-25 DIAGNOSIS — I129 Hypertensive chronic kidney disease with stage 1 through stage 4 chronic kidney disease, or unspecified chronic kidney disease: Secondary | ICD-10-CM | POA: Insufficient documentation

## 2013-09-25 DIAGNOSIS — Z7982 Long term (current) use of aspirin: Secondary | ICD-10-CM | POA: Insufficient documentation

## 2013-09-25 DIAGNOSIS — N184 Chronic kidney disease, stage 4 (severe): Secondary | ICD-10-CM | POA: Insufficient documentation

## 2013-09-25 DIAGNOSIS — N039 Chronic nephritic syndrome with unspecified morphologic changes: Principal | ICD-10-CM

## 2013-09-25 DIAGNOSIS — E119 Type 2 diabetes mellitus without complications: Secondary | ICD-10-CM | POA: Insufficient documentation

## 2013-09-25 DIAGNOSIS — Z794 Long term (current) use of insulin: Secondary | ICD-10-CM | POA: Insufficient documentation

## 2013-09-25 DIAGNOSIS — N2581 Secondary hyperparathyroidism of renal origin: Secondary | ICD-10-CM | POA: Insufficient documentation

## 2013-09-25 LAB — POCT HEMOGLOBIN-HEMACUE: Hemoglobin: 10.2 g/dL — ABNORMAL LOW (ref 12.0–15.0)

## 2013-09-25 MED ORDER — EPOETIN ALFA 20000 UNIT/ML IJ SOLN
INTRAMUSCULAR | Status: AC
  Administered 2013-09-25: 20000 [IU] via SUBCUTANEOUS
  Filled 2013-09-25: qty 1

## 2013-09-25 MED ORDER — EPOETIN ALFA 20000 UNIT/ML IJ SOLN
20000.0000 [IU] | INTRAMUSCULAR | Status: DC
  Administered 2013-09-25: 20000 [IU] via SUBCUTANEOUS

## 2013-10-02 ENCOUNTER — Telehealth: Payer: Self-pay | Admitting: Internal Medicine

## 2013-10-02 NOTE — Telephone Encounter (Signed)
Relevant patient education mailed to patient.  

## 2013-10-06 ENCOUNTER — Other Ambulatory Visit (HOSPITAL_COMMUNITY): Payer: Self-pay | Admitting: *Deleted

## 2013-10-09 ENCOUNTER — Encounter (HOSPITAL_COMMUNITY)
Admission: RE | Admit: 2013-10-09 | Discharge: 2013-10-09 | Disposition: A | Discharge status: Home / Self Care | Payer: BC Managed Care – PPO | Source: Ambulatory Visit | Attending: Nephrology | Admitting: Nephrology

## 2013-10-09 ENCOUNTER — Encounter (HOSPITAL_COMMUNITY): Payer: BC Managed Care – PPO

## 2013-10-09 DIAGNOSIS — D638 Anemia in other chronic diseases classified elsewhere: Secondary | ICD-10-CM | POA: Insufficient documentation

## 2013-10-09 DIAGNOSIS — N184 Chronic kidney disease, stage 4 (severe): Secondary | ICD-10-CM | POA: Insufficient documentation

## 2013-10-09 LAB — FERRITIN: Ferritin: 676 ng/mL — ABNORMAL HIGH (ref 10–291)

## 2013-10-09 LAB — POCT HEMOGLOBIN-HEMACUE: Hemoglobin: 10.2 g/dL — ABNORMAL LOW (ref 12.0–15.0)

## 2013-10-09 LAB — IRON AND TIBC
Iron: 61 ug/dL (ref 42–135)
Saturation Ratios: 31 % (ref 20–55)
TIBC: 199 ug/dL — ABNORMAL LOW (ref 250–470)
UIBC: 138 ug/dL (ref 125–400)

## 2013-10-09 MED ORDER — EPOETIN ALFA 20000 UNIT/ML IJ SOLN
INTRAMUSCULAR | Status: AC
  Filled 2013-10-09: qty 1

## 2013-10-09 MED ORDER — EPOETIN ALFA 20000 UNIT/ML IJ SOLN
20000.0000 [IU] | INTRAMUSCULAR | Status: DC
  Administered 2013-10-09: 20000 [IU] via SUBCUTANEOUS

## 2013-10-10 ENCOUNTER — Encounter: Payer: Self-pay | Admitting: *Deleted

## 2013-10-10 LAB — PTH, INTACT AND CALCIUM
Calcium, Total (PTH): 9.4 mg/dL (ref 8.4–10.5)
PTH: 151.2 pg/mL — ABNORMAL HIGH (ref 14.0–72.0)

## 2013-10-23 ENCOUNTER — Encounter (HOSPITAL_COMMUNITY)
Admission: RE | Admit: 2013-10-23 | Discharge: 2013-10-23 | Disposition: A | Discharge status: Home / Self Care | Payer: BC Managed Care – PPO | Source: Ambulatory Visit | Attending: Nephrology | Admitting: Nephrology

## 2013-10-23 DIAGNOSIS — N039 Chronic nephritic syndrome with unspecified morphologic changes: Principal | ICD-10-CM

## 2013-10-23 DIAGNOSIS — D631 Anemia in chronic kidney disease: Secondary | ICD-10-CM | POA: Insufficient documentation

## 2013-10-23 DIAGNOSIS — N186 End stage renal disease: Secondary | ICD-10-CM | POA: Insufficient documentation

## 2013-10-23 LAB — POCT HEMOGLOBIN-HEMACUE: Hemoglobin: 10.1 g/dL — ABNORMAL LOW (ref 12.0–15.0)

## 2013-10-23 MED ORDER — EPOETIN ALFA 20000 UNIT/ML IJ SOLN
INTRAMUSCULAR | Status: AC
  Filled 2013-10-23: qty 1

## 2013-10-23 MED ORDER — EPOETIN ALFA 20000 UNIT/ML IJ SOLN
20000.0000 [IU] | INTRAMUSCULAR | Status: DC
  Administered 2013-10-23: 20000 [IU] via SUBCUTANEOUS

## 2013-10-27 ENCOUNTER — Telehealth: Payer: Self-pay | Admitting: Cardiovascular Disease

## 2013-10-27 NOTE — Telephone Encounter (Signed)
Per answering service-She never received report for pt from [phone]

## 2013-10-27 NOTE — Telephone Encounter (Signed)
Returned call to [place].  Not available.  Spoke w/ other staff member.  Stated they needed to get a copy of pt's test results faxed to [phone].  Informed medical records will be notified.  Verbalized understanding.  Will have [place] call back if anything else needed.  Message forwarded to Medical Records to process ROI.

## 2013-10-27 NOTE — Telephone Encounter (Signed)
Pt was not seen in our office on 10.22.14.  First visit was 1.9.15.  Pt canceled or no showed previously scheduled appts.  Returned call to [place].  Transferred to VM.  Left message to call back today before 4pm.  Message forwarded to Curt Bears, RN in case she is aware of report being referenced.

## 2013-11-06 ENCOUNTER — Encounter (HOSPITAL_COMMUNITY)
Admission: RE | Admit: 2013-11-06 | Discharge: 2013-11-06 | Disposition: A | Discharge status: Home / Self Care | Payer: BC Managed Care – PPO | Source: Ambulatory Visit | Attending: Nephrology | Admitting: Nephrology

## 2013-11-06 DIAGNOSIS — N184 Chronic kidney disease, stage 4 (severe): Secondary | ICD-10-CM | POA: Insufficient documentation

## 2013-11-06 DIAGNOSIS — D638 Anemia in other chronic diseases classified elsewhere: Secondary | ICD-10-CM | POA: Insufficient documentation

## 2013-11-06 LAB — POCT HEMOGLOBIN-HEMACUE: Hemoglobin: 10.6 g/dL — ABNORMAL LOW (ref 12.0–15.0)

## 2013-11-06 LAB — IRON AND TIBC
Iron: 62 ug/dL (ref 42–135)
Saturation Ratios: 34 % (ref 20–55)
TIBC: 180 ug/dL — ABNORMAL LOW (ref 250–470)
UIBC: 118 ug/dL — ABNORMAL LOW (ref 125–400)

## 2013-11-06 LAB — RENAL FUNCTION PANEL
Albumin: 3.4 g/dL — ABNORMAL LOW (ref 3.5–5.2)
BUN: 47 mg/dL — ABNORMAL HIGH (ref 6–23)
CO2: 20 mEq/L (ref 19–32)
Calcium: 9.2 mg/dL (ref 8.4–10.5)
Chloride: 97 mEq/L (ref 96–112)
Creatinine, Ser: 6.71 mg/dL — ABNORMAL HIGH (ref 0.50–1.10)
GFR calc Af Amer: 7 mL/min — ABNORMAL LOW (ref >90)
GFR calc non Af Amer: 6 mL/min — ABNORMAL LOW (ref >90)
Glucose, Bld: 361 mg/dL — ABNORMAL HIGH (ref 70–99)
Phosphorus: 6.9 mg/dL — ABNORMAL HIGH (ref 2.3–4.6)
Potassium: 4.7 mEq/L (ref 3.7–5.3)
Sodium: 137 mEq/L (ref 137–147)

## 2013-11-06 LAB — FERRITIN: Ferritin: 740 ng/mL — ABNORMAL HIGH (ref 10–291)

## 2013-11-06 MED ORDER — EPOETIN ALFA 20000 UNIT/ML IJ SOLN: 20000.0000 [IU] | INTRAMUSCULAR | Status: DC

## 2013-11-06 MED ORDER — EPOETIN ALFA 20000 UNIT/ML IJ SOLN
INTRAMUSCULAR | Status: AC
  Administered 2013-11-06: 20000 [IU] via SUBCUTANEOUS
  Filled 2013-11-06: qty 1

## 2013-11-07 ENCOUNTER — Ambulatory Visit (INDEPENDENT_AMBULATORY_CARE_PROVIDER_SITE_OTHER): Payer: BC Managed Care – PPO | Admitting: Cardiovascular Disease

## 2013-11-07 ENCOUNTER — Encounter: Payer: Self-pay | Admitting: Cardiovascular Disease

## 2013-11-07 VITALS — BP 130/64 | HR 82 | Ht 60.0 in | Wt 128.5 lb

## 2013-11-07 DIAGNOSIS — L97519 Non-pressure chronic ulcer of other part of right foot with unspecified severity: Secondary | ICD-10-CM | POA: Insufficient documentation

## 2013-11-07 DIAGNOSIS — L97509 Non-pressure chronic ulcer of other part of unspecified foot with unspecified severity: Secondary | ICD-10-CM

## 2013-11-07 LAB — PTH, INTACT AND CALCIUM
Calcium, Total (PTH): 8.5 mg/dL (ref 8.4–10.5)
PTH: 201 pg/mL — ABNORMAL HIGH (ref 14.0–72.0)

## 2013-11-07 NOTE — Patient Instructions (Signed)
Follow up with Dr Gwenlyn Found has needed.

## 2013-11-07 NOTE — Progress Notes (Signed)
11/07/2013 [name]   [birth year]  [id number]  Primary Physician Cathlean Cower, MD Primary Cardiologist: Lorretta Harp MD [place], [place]   HPI:  Carolyn Valenzuela is a 63 y.o. female with h/o type 2 diabetes, hyperlipidemia, Hypertension, chronic kidney disease stage 4, not on dialysis who presents as new patient for evaluation of peripheral vascular disease.  Patient reports that she has been having some discomfort in the lateral aspects of her right and left toes. Pain is present both at rest and with activity, has been ongoing for 4-5 months. It is worst with touch. She also has a chronic wound on her right malleolus for which she is seen by her podiatrist, Dr. Elby Showers. She otherwise denies any pain in her calves or legs with ambulation.  She denies any shortness of breath, chest pain, recent lower extremity swelling or palpitations.  She works at [employer] at [employer] and is on her feet all day. She doesn't exercise on a regular basis.  She has never smoked. She denies any history of heart disease and any family history of ischemic heart disease.   I saw HER-2 months ago. Since that time I performed lower extremity arterial Doppler studies which did not show significant obstructive disease. Her right lateral malleolar ulcer hasn't healed. She does continue to have foot pain. She scheduled to see Dr. Trellis Paganini at [facility] for further evaluation.    Current Outpatient Prescriptions  Medication Sig Dispense Refill  . amLODipine (NORVASC) 2.5 MG tablet TAKE ONE TABLET BY MOUTH TWICE DAILY  60 tablet  11  . aspirin 81 MG EC tablet Take 81 mg by mouth daily.        . calcitRIOL (ROCALTROL) 0.5 MCG capsule       . carvedilol (COREG) 25 MG tablet TAKE ONE TABLET BY MOUTH TWICE DAILY  60 tablet  11  . furosemide (LASIX) 40 MG tablet TAKE ONE TABLET BY MOUTH TWICE DAILY  180 tablet  3  . glimepiride (AMARYL) 4 MG tablet TAKE ONE TABLET BY  MOUTH TWICE DAILY  180 tablet  3  . glucose blood (ONE TOUCH ULTRA TEST) test strip 1 each. Use as directed two times a day       . JANUVIA 100 MG tablet TAKE ONE TABLET BY MOUTH ONCE DAILY  90 tablet  3  . LANTUS SOLOSTAR 100 UNIT/ML SOPN INJECT 55 UNITS SUBCUTANEOUSLY EVERY DAY  15 mL  6  . ONE TOUCH LANCETS MISC Use as directed two times a day       . fexofenadine (ALLEGRA) 180 MG tablet Take 1 tablet (180 mg total) by mouth daily.  30 tablet  2  . pantoprazole (PROTONIX) 40 MG tablet Take 1 tablet (40 mg total) by mouth daily.  30 tablet  11  . simvastatin (ZOCOR) 20 MG tablet Take 1 tablet (20 mg total) by mouth at bedtime.  90 tablet  3   No current facility-administered medications for this visit.    Allergies  Allergen Reactions  . Oxycodone Nausea And Vomiting    History   Social History  . Marital Status: Single    Spouse Name: N/A    Number of Children: N/A  . Years of Education: N/A   Occupational History  . [place] History Main Topics  . Smoking status: Never Smoker   . Smokeless tobacco: Never Used  . Alcohol Use: No  . Drug  Use: No  . Sexual Activity: Not on file   Other Topics Concern  . Not on file   Social History Narrative  . No narrative on file     Review of Systems: General: negative for chills, fever, night sweats or weight changes.  Cardiovascular: negative for chest pain, dyspnea on exertion, edema, orthopnea, palpitations, paroxysmal nocturnal dyspnea or shortness of breath Dermatological: negative for rash Respiratory: negative for cough or wheezing Urologic: negative for hematuria Abdominal: negative for nausea, vomiting, diarrhea, bright red blood per rectum, melena, or hematemesis Neurologic: negative for visual changes, syncope, or dizziness All other systems reviewed and are otherwise negative except as noted above.    Blood pressure 130/64, pulse 82, height 5' (1.524 m), weight 128 lb 8 oz  (58.287 kg).  General appearance: alert and no distress Neck: no adenopathy, no carotid bruit, no JVD, supple, symmetrical, trachea midline and thyroid not enlarged, symmetric, no tenderness/mass/nodules Lungs: clear to auscultation bilaterally Heart: regular rate and rhythm, S1, S2 normal, no murmur, click, rub or gallop Extremities: extremities normal, atraumatic, no cyanosis or edema  EKG not performed today  ASSESSMENT AND PLAN:   Right foot ulcer The patient was referred to me for a slowly healing ulcer on her right lateral malleolus. She does have bilateral foot pain. She had lower extremity arterial Doppler studies performed in the office 09/20/13 that revealed a left ABI of 1 more importantly her duplex revealed no evidence of obstructive disease. Her ulcers since he'll SR 2 months ago. She scheduled to see Dr. Trellis Paganini at the [facility] for further podiatry evaluation. I will see her back when necessary.      Lorretta Harp MD FACP,FACC,FAHA, [facility] 11/07/2013 4:03 PM

## 2013-11-07 NOTE — Assessment & Plan Note (Signed)
The patient was referred to me for a slowly healing ulcer on her right lateral malleolus. She does have bilateral foot pain. She had lower extremity arterial Doppler studies performed in the office 09/20/13 that revealed a left ABI of 1 more importantly her duplex revealed no evidence of obstructive disease. Her ulcers since he'll SR 2 months ago. She scheduled to see Dr. Trellis Paganini at the [facility] for further podiatry evaluation. I will see her back when necessary.

## 2013-11-20 ENCOUNTER — Encounter (HOSPITAL_COMMUNITY)
Admission: RE | Admit: 2013-11-20 | Discharge: 2013-11-20 | Disposition: A | Discharge status: Home / Self Care | Payer: BC Managed Care – PPO | Source: Ambulatory Visit | Attending: Nephrology | Admitting: Nephrology

## 2013-11-20 LAB — POCT HEMOGLOBIN-HEMACUE: Hemoglobin: 9.3 g/dL — ABNORMAL LOW (ref 12.0–15.0)

## 2013-11-20 MED ORDER — EPOETIN ALFA 20000 UNIT/ML IJ SOLN: 20000.0000 [IU] | INTRAMUSCULAR | Status: DC

## 2013-11-20 MED ORDER — EPOETIN ALFA 20000 UNIT/ML IJ SOLN
INTRAMUSCULAR | Status: AC
  Administered 2013-11-20: 20000 [IU] via SUBCUTANEOUS
  Filled 2013-11-20: qty 1

## 2013-11-29 ENCOUNTER — Telehealth: Payer: Self-pay

## 2013-11-29 MED ORDER — SIMVASTATIN 20 MG PO TABS: 20.0000 mg | ORAL_TABLET | Freq: Every day | ORAL | Status: DC

## 2013-11-29 NOTE — Telephone Encounter (Signed)
Refill done.  

## 2013-11-29 NOTE — Telephone Encounter (Signed)
Fwd to Robin.

## 2013-11-29 NOTE — Telephone Encounter (Signed)
pcp dr Jenny Reichmann would be happy to refill

## 2013-11-29 NOTE — Telephone Encounter (Signed)
Received fax from pharmacy requesting a refill on Simvastatin.  Please advise if ok to refill. Thanks!

## 2013-12-04 ENCOUNTER — Encounter (HOSPITAL_COMMUNITY)
Admission: RE | Admit: 2013-12-04 | Discharge: 2013-12-04 | Disposition: A | Discharge status: Home / Self Care | Payer: BC Managed Care – PPO | Source: Ambulatory Visit | Attending: Nephrology | Admitting: Nephrology

## 2013-12-04 DIAGNOSIS — D509 Iron deficiency anemia, unspecified: Secondary | ICD-10-CM | POA: Insufficient documentation

## 2013-12-04 DIAGNOSIS — Z5181 Encounter for therapeutic drug level monitoring: Secondary | ICD-10-CM | POA: Insufficient documentation

## 2013-12-04 DIAGNOSIS — I120 Hypertensive chronic kidney disease with stage 5 chronic kidney disease or end stage renal disease: Secondary | ICD-10-CM | POA: Insufficient documentation

## 2013-12-04 DIAGNOSIS — E119 Type 2 diabetes mellitus without complications: Secondary | ICD-10-CM | POA: Insufficient documentation

## 2013-12-04 DIAGNOSIS — N186 End stage renal disease: Secondary | ICD-10-CM | POA: Insufficient documentation

## 2013-12-04 LAB — IRON AND TIBC
Iron: 59 ug/dL (ref 42–135)
Saturation Ratios: 32 % (ref 20–55)
TIBC: 185 ug/dL — ABNORMAL LOW (ref 250–470)
UIBC: 126 ug/dL (ref 125–400)

## 2013-12-04 LAB — RENAL FUNCTION PANEL
Albumin: 3 g/dL — ABNORMAL LOW (ref 3.5–5.2)
BUN: 52 mg/dL — ABNORMAL HIGH (ref 6–23)
CO2: 21 mEq/L (ref 19–32)
Calcium: 9.5 mg/dL (ref 8.4–10.5)
Chloride: 100 mEq/L (ref 96–112)
Creatinine, Ser: 6.56 mg/dL — ABNORMAL HIGH (ref 0.50–1.10)
GFR calc Af Amer: 7 mL/min — ABNORMAL LOW (ref >90)
GFR calc non Af Amer: 6 mL/min — ABNORMAL LOW (ref >90)
Glucose, Bld: 306 mg/dL — ABNORMAL HIGH (ref 70–99)
Phosphorus: 6.4 mg/dL — ABNORMAL HIGH (ref 2.3–4.6)
Potassium: 4 mEq/L (ref 3.7–5.3)
Sodium: 140 mEq/L (ref 137–147)

## 2013-12-04 LAB — FERRITIN: Ferritin: 523 ng/mL — ABNORMAL HIGH (ref 10–291)

## 2013-12-04 MED ORDER — EPOETIN ALFA 20000 UNIT/ML IJ SOLN
INTRAMUSCULAR | Status: AC
  Filled 2013-12-04: qty 1

## 2013-12-04 MED ORDER — EPOETIN ALFA 20000 UNIT/ML IJ SOLN
20000.0000 [IU] | INTRAMUSCULAR | Status: DC
  Administered 2013-12-04: 20000 [IU] via SUBCUTANEOUS

## 2013-12-05 LAB — POCT HEMOGLOBIN-HEMACUE: Hemoglobin: 10 g/dL — ABNORMAL LOW (ref 12.0–15.0)

## 2013-12-05 LAB — PTH, INTACT AND CALCIUM
Calcium, Total (PTH): 8.9 mg/dL (ref 8.4–10.5)
PTH: 126.5 pg/mL — ABNORMAL HIGH (ref 14.0–72.0)

## 2013-12-18 ENCOUNTER — Encounter (HOSPITAL_COMMUNITY): Payer: BC Managed Care – PPO

## 2013-12-18 ENCOUNTER — Encounter (HOSPITAL_COMMUNITY)
Admission: RE | Admit: 2013-12-18 | Discharge: 2013-12-18 | Disposition: A | Discharge status: Home / Self Care | Payer: BC Managed Care – PPO | Source: Ambulatory Visit | Attending: Nephrology | Admitting: Nephrology

## 2013-12-18 LAB — POCT HEMOGLOBIN-HEMACUE: Hemoglobin: 9.1 g/dL — ABNORMAL LOW (ref 12.0–15.0)

## 2013-12-18 MED ORDER — EPOETIN ALFA 20000 UNIT/ML IJ SOLN
INTRAMUSCULAR | Status: AC
  Administered 2013-12-18: 20000 [IU] via SUBCUTANEOUS
  Filled 2013-12-18: qty 1

## 2013-12-18 MED ORDER — EPOETIN ALFA 20000 UNIT/ML IJ SOLN: 20000.0000 [IU] | INTRAMUSCULAR | Status: DC

## 2014-01-01 ENCOUNTER — Encounter (HOSPITAL_COMMUNITY)
Admission: RE | Admit: 2014-01-01 | Discharge: 2014-01-01 | Disposition: A | Discharge status: Home / Self Care | Payer: BC Managed Care – PPO | Source: Ambulatory Visit | Attending: Nephrology | Admitting: Nephrology

## 2014-01-01 DIAGNOSIS — D638 Anemia in other chronic diseases classified elsewhere: Secondary | ICD-10-CM | POA: Insufficient documentation

## 2014-01-01 DIAGNOSIS — N184 Chronic kidney disease, stage 4 (severe): Secondary | ICD-10-CM | POA: Insufficient documentation

## 2014-01-01 LAB — IRON AND TIBC
Iron: 36 ug/dL — ABNORMAL LOW (ref 42–135)
Saturation Ratios: 18 % — ABNORMAL LOW (ref 20–55)
TIBC: 198 ug/dL — ABNORMAL LOW (ref 250–470)
UIBC: 162 ug/dL (ref 125–400)

## 2014-01-01 LAB — RENAL FUNCTION PANEL
Albumin: 3.2 g/dL — ABNORMAL LOW (ref 3.5–5.2)
BUN: 59 mg/dL — ABNORMAL HIGH (ref 6–23)
CO2: 27 mEq/L (ref 19–32)
Calcium: 9.6 mg/dL (ref 8.4–10.5)
Chloride: 93 mEq/L — ABNORMAL LOW (ref 96–112)
Creatinine, Ser: 6.64 mg/dL — ABNORMAL HIGH (ref 0.50–1.10)
GFR calc Af Amer: 7 mL/min — ABNORMAL LOW (ref >90)
GFR calc non Af Amer: 6 mL/min — ABNORMAL LOW (ref >90)
Glucose, Bld: 212 mg/dL — ABNORMAL HIGH (ref 70–99)
Phosphorus: 7.4 mg/dL — ABNORMAL HIGH (ref 2.3–4.6)
Potassium: 3.9 mEq/L (ref 3.7–5.3)
Sodium: 139 mEq/L (ref 137–147)

## 2014-01-01 LAB — POCT HEMOGLOBIN-HEMACUE: Hemoglobin: 9.5 g/dL — ABNORMAL LOW (ref 12.0–15.0)

## 2014-01-01 LAB — FERRITIN: Ferritin: 682 ng/mL — ABNORMAL HIGH (ref 10–291)

## 2014-01-01 MED ORDER — EPOETIN ALFA 20000 UNIT/ML IJ SOLN: 20000.0000 [IU] | INTRAMUSCULAR | Status: DC

## 2014-01-01 MED ORDER — EPOETIN ALFA 20000 UNIT/ML IJ SOLN
INTRAMUSCULAR | Status: AC
  Administered 2014-01-01: 20000 [IU] via SUBCUTANEOUS
  Filled 2014-01-01: qty 1

## 2014-01-02 LAB — PTH, INTACT AND CALCIUM
Calcium, Total (PTH): 9.3 mg/dL (ref 8.4–10.5)
PTH: 147.3 pg/mL — ABNORMAL HIGH (ref 14.0–72.0)

## 2014-01-09 ENCOUNTER — Ambulatory Visit (INDEPENDENT_AMBULATORY_CARE_PROVIDER_SITE_OTHER): Payer: BC Managed Care – PPO | Admitting: Cardiovascular Disease

## 2014-01-09 ENCOUNTER — Encounter: Payer: Self-pay | Admitting: Cardiovascular Disease

## 2014-01-09 VITALS — BP 140/76 | HR 86 | Ht 60.0 in | Wt 124.6 lb

## 2014-01-09 DIAGNOSIS — I70229 Atherosclerosis of native arteries of extremities with rest pain, unspecified extremity: Secondary | ICD-10-CM | POA: Insufficient documentation

## 2014-01-09 DIAGNOSIS — R0989 Other specified symptoms and signs involving the circulatory and respiratory systems: Secondary | ICD-10-CM

## 2014-01-09 DIAGNOSIS — I999 Unspecified disorder of circulatory system: Secondary | ICD-10-CM

## 2014-01-09 DIAGNOSIS — I998 Other disorder of circulatory system: Secondary | ICD-10-CM

## 2014-01-09 NOTE — Assessment & Plan Note (Signed)
This returns today for followup. I saw her in 11/07/13. She is sent to me for slowly healing ulcers on her right foot. Dopplers of her office showed widely patent vessels with ABIs that could not be measured because of calcification in this significant obstructive disease. The eyes however would diminished bilaterally with a right toe pressure 0.39 and left of 0.33. This suggested the possibility of poor healing as a result of that procedure. She did have 2 toes operated on on 4/14 and these have become gangrenous. I suspect a lot of Apatate. She has palpable pedal pulses. I suspect she has small vessel disease a result of her diabetes and chronic renal insufficiency.

## 2014-01-09 NOTE — Progress Notes (Signed)
01/09/2014 [name]   [birth year]  [id number]  Primary Physician Cathlean Cower, MD Primary Cardiologist: Carolyn Harp MD [place], [place]   HPI:  Carolyn Valenzuela is a 63 y.o. female with h/o type 2 diabetes, hyperlipidemia, Hypertension, chronic kidney disease stage 4, not on dialysis who presents as new patient for evaluation of peripheral vascular disease.  Patient reports that she has been having some discomfort in the lateral aspects of her right and left toes. Pain is present both at rest and with activity, has been ongoing for 4-5 months. It is worst with touch. She also has a chronic wound on her right malleolus for which she is seen by her podiatrist, Dr. Elby Showers. She otherwise denies any pain in her calves or legs with ambulation.  She denies any shortness of breath, chest pain, recent lower extremity swelling or palpitations.  She works at [employer] at [employer] and is on her feet all day. She doesn't exercise on a regular basis.  She has never smoked. She denies any history of heart disease and any family history of ischemic heart disease.   I performed lower extremity arterial Doppler studies which did not show significant obstructive disease however she is to have diminished TBIs  bilaterally suggesting the possibility of poor healing as a result of a surgical procedure.. Her right lateral malleolar ulcer hasn't healed. She does continue to have foot pain. She scheduled to see Dr. Trellis Paganini at [facility] for further evaluation.since I saw her she is undergoing surgery on her right fourth and fifth toes and assessed with Dr. Lovie Macadamia and his toes. This appears to be dry gangrene and will probably auto amputate. She does have palpable pedal pulses suggesting small vessel disease.    Current Outpatient Prescriptions  Medication Sig Dispense Refill  . amLODipine (NORVASC) 2.5 MG tablet TAKE ONE TABLET BY MOUTH TWICE DAILY  60 tablet   11  . aspirin 81 MG EC tablet Take 81 mg by mouth daily.        . calcitRIOL (ROCALTROL) 0.5 MCG capsule       . carvedilol (COREG) 25 MG tablet TAKE ONE TABLET BY MOUTH TWICE DAILY  60 tablet  11  . furosemide (LASIX) 40 MG tablet TAKE ONE TABLET BY MOUTH TWICE DAILY  180 tablet  3  . glimepiride (AMARYL) 4 MG tablet TAKE ONE TABLET BY MOUTH TWICE DAILY  180 tablet  3  . glucose blood (ONE TOUCH ULTRA TEST) test strip 1 each. Use as directed two times a day       . HYDROcodone-acetaminophen (NORCO) 10-325 MG per tablet Take 1 tablet by mouth at bedtime as needed.      [name] JANUVIA 100 MG tablet TAKE ONE TABLET BY MOUTH ONCE DAILY  90 tablet  3  . LANTUS SOLOSTAR 100 UNIT/ML SOPN INJECT 55 UNITS SUBCUTANEOUSLY EVERY DAY  15 mL  6  . ONE TOUCH LANCETS MISC Use as directed two times a day       . simvastatin (ZOCOR) 20 MG tablet Take 1 tablet (20 mg total) by mouth at bedtime.  90 tablet  3  . sulfamethoxazole-trimethoprim (BACTRIM DS) 800-160 MG per tablet Take 1 tablet by mouth 2 (two) times daily.      . fexofenadine (ALLEGRA) 180 MG tablet Take 1 tablet (180 mg total) by mouth daily.  30 tablet  2   No current facility-administered medications for this visit.  Allergies  Allergen Reactions  . Oxycodone Nausea And Vomiting    History   Social History  . Marital Status: Single    Spouse Name: N/A    Number of Children: N/A  . Years of Education: N/A   Occupational History  . [place] History Main Topics  . Smoking status: Never Smoker   . Smokeless tobacco: Never Used  . Alcohol Use: No  . Drug Use: No  . Sexual Activity: Not on file   Other Topics Concern  . Not on file   Social History Narrative  . No narrative on file     Review of Systems: General: negative for chills, fever, night sweats or weight changes.  Cardiovascular: negative for chest pain, dyspnea on exertion, edema, orthopnea, palpitations, paroxysmal nocturnal dyspnea  or shortness of breath Dermatological: negative for rash Respiratory: negative for cough or wheezing Urologic: negative for hematuria Abdominal: negative for nausea, vomiting, diarrhea, bright red blood per rectum, melena, or hematemesis Neurologic: negative for visual changes, syncope, or dizziness All other systems reviewed and are otherwise negative except as noted above.    Blood pressure 140/76, pulse 86, height 5' (1.524 m), weight 124 lb 9.6 oz (56.518 kg).  General appearance: alert and no distress Neck: no adenopathy, no JVD, supple, symmetrical, trachea midline, thyroid not enlarged, symmetric, no tenderness/mass/nodules and soft left carotid bruit Lungs: clear to auscultation bilaterally Heart: regular rate and rhythm, S1, S2 normal, no murmur, click, rub or gallop Extremities: dry gangrene right fourth and fifth toes  EKG not performed today  ASSESSMENT AND PLAN:   Critical lower limb ischemia This returns today for followup. I saw her in 11/07/13. She is sent to me for slowly healing ulcers on her right foot. Dopplers of her office showed widely patent vessels with ABIs that could not be measured because of calcification in this significant obstructive disease. The eyes however would diminished bilaterally with a right toe pressure 0.39 and left of 0.33. This suggested the possibility of poor healing as a result of that procedure. She did have 2 toes operated on on 4/14 and these have become gangrenous. I suspect a lot of Apatate. She has palpable pedal pulses. I suspect she has small vessel disease a result of her diabetes and chronic renal insufficiency.      Carolyn Harp MD FACP,FACC,FAHA, [facility] 01/09/2014 4:50 PM

## 2014-01-09 NOTE — Patient Instructions (Signed)
  We will see you back in follow up in 3 months with Dr Gwenlyn Found  Dr Gwenlyn Found has ordered Carotid Duplex- This test is an ultrasound of the carotid arteries in your neck. It looks at blood flow through these arteries that supply the brain with blood. Allow one hour for this exam. There are no restrictions or special instructions.

## 2014-01-15 ENCOUNTER — Inpatient Hospital Stay (HOSPITAL_COMMUNITY): Admission: RE | Admit: 2014-01-15 | Payer: BC Managed Care – PPO | Source: Ambulatory Visit

## 2014-01-16 ENCOUNTER — Ambulatory Visit (HOSPITAL_COMMUNITY)
Admission: RE | Admit: 2014-01-16 | Discharge: 2014-01-16 | Disposition: A | Discharge status: Home / Self Care | Payer: BC Managed Care – PPO | Source: Ambulatory Visit | Attending: Internal Medicine | Admitting: Internal Medicine

## 2014-01-16 DIAGNOSIS — R0989 Other specified symptoms and signs involving the circulatory and respiratory systems: Secondary | ICD-10-CM | POA: Insufficient documentation

## 2014-01-16 NOTE — Progress Notes (Signed)
Carotid Duplex Completed. °Brianna L Mazza,RVT °

## 2014-01-26 ENCOUNTER — Encounter (HOSPITAL_COMMUNITY)
Admission: RE | Admit: 2014-01-26 | Discharge: 2014-01-26 | Disposition: A | Discharge status: Home / Self Care | Payer: BC Managed Care – PPO | Source: Ambulatory Visit | Attending: Nephrology | Admitting: Nephrology

## 2014-01-26 LAB — POCT HEMOGLOBIN-HEMACUE: Hemoglobin: 8.3 g/dL — ABNORMAL LOW (ref 12.0–15.0)

## 2014-01-26 MED ORDER — EPOETIN ALFA 20000 UNIT/ML IJ SOLN
20000.0000 [IU] | INTRAMUSCULAR | Status: DC
  Administered 2014-01-26: 20000 [IU] via SUBCUTANEOUS

## 2014-01-26 MED ORDER — EPOETIN ALFA 20000 UNIT/ML IJ SOLN
INTRAMUSCULAR | Status: AC
  Administered 2014-01-26: 20000 [IU] via SUBCUTANEOUS
  Filled 2014-01-26: qty 1

## 2014-01-30 ENCOUNTER — Telehealth: Payer: Self-pay | Admitting: Internal Medicine

## 2014-01-30 DIAGNOSIS — IMO0001 Reserved for inherently not codable concepts without codable children: Secondary | ICD-10-CM

## 2014-01-30 DIAGNOSIS — S91309A Unspecified open wound, unspecified foot, initial encounter: Secondary | ICD-10-CM

## 2014-01-30 DIAGNOSIS — E1165 Type 2 diabetes mellitus with hyperglycemia: Secondary | ICD-10-CM

## 2014-01-30 NOTE — Telephone Encounter (Signed)
Pt had foot surgery April 14.  She still has a wound where the surgery was done.  It is not healing.  She wants a referral to the wound center.

## 2014-01-30 NOTE — Telephone Encounter (Signed)
Patient informed of referral

## 2014-01-30 NOTE — Telephone Encounter (Signed)
Done per emr 

## 2014-02-08 ENCOUNTER — Encounter (HOSPITAL_COMMUNITY)
Admission: RE | Admit: 2014-02-08 | Discharge: 2014-02-08 | Disposition: A | Discharge status: Home / Self Care | Payer: BC Managed Care – PPO | Source: Ambulatory Visit | Attending: Nephrology | Admitting: Nephrology

## 2014-02-08 DIAGNOSIS — N184 Chronic kidney disease, stage 4 (severe): Secondary | ICD-10-CM | POA: Insufficient documentation

## 2014-02-08 DIAGNOSIS — D638 Anemia in other chronic diseases classified elsewhere: Secondary | ICD-10-CM | POA: Insufficient documentation

## 2014-02-08 LAB — IRON AND TIBC
Iron: 32 ug/dL — ABNORMAL LOW (ref 42–135)
Saturation Ratios: 17 % — ABNORMAL LOW (ref 20–55)
TIBC: 186 ug/dL — ABNORMAL LOW (ref 250–470)
UIBC: 154 ug/dL (ref 125–400)

## 2014-02-08 LAB — RENAL FUNCTION PANEL
Albumin: 3.3 g/dL — ABNORMAL LOW (ref 3.5–5.2)
BUN: 61 mg/dL — ABNORMAL HIGH (ref 6–23)
CO2: 19 mEq/L (ref 19–32)
Calcium: 9.8 mg/dL (ref 8.4–10.5)
Chloride: 95 mEq/L — ABNORMAL LOW (ref 96–112)
Creatinine, Ser: 7.52 mg/dL — ABNORMAL HIGH (ref 0.50–1.10)
GFR calc Af Amer: 6 mL/min — ABNORMAL LOW (ref >90)
GFR calc non Af Amer: 5 mL/min — ABNORMAL LOW (ref >90)
Glucose, Bld: 298 mg/dL — ABNORMAL HIGH (ref 70–99)
Phosphorus: 10.1 mg/dL — ABNORMAL HIGH (ref 2.3–4.6)
Potassium: 4.9 mEq/L (ref 3.7–5.3)
Sodium: 137 mEq/L (ref 137–147)

## 2014-02-08 LAB — FERRITIN: Ferritin: 618 ng/mL — ABNORMAL HIGH (ref 10–291)

## 2014-02-08 MED ORDER — EPOETIN ALFA 20000 UNIT/ML IJ SOLN
INTRAMUSCULAR | Status: AC
  Filled 2014-02-08: qty 1

## 2014-02-08 MED ORDER — EPOETIN ALFA 20000 UNIT/ML IJ SOLN
20000.0000 [IU] | INTRAMUSCULAR | Status: DC
  Administered 2014-02-08: 20000 [IU] via SUBCUTANEOUS

## 2014-02-09 LAB — PTH, INTACT AND CALCIUM
Calcium, Total (PTH): 9.1 mg/dL (ref 8.4–10.5)
PTH: 151.2 pg/mL — ABNORMAL HIGH (ref 14.0–72.0)

## 2014-02-09 LAB — POCT HEMOGLOBIN-HEMACUE: Hemoglobin: 8.3 g/dL — ABNORMAL LOW (ref 12.0–15.0)

## 2014-02-22 ENCOUNTER — Encounter (HOSPITAL_COMMUNITY)
Admission: RE | Admit: 2014-02-22 | Discharge: 2014-02-22 | Disposition: A | Discharge status: Home / Self Care | Payer: BC Managed Care – PPO | Source: Ambulatory Visit | Attending: Nephrology | Admitting: Nephrology

## 2014-02-22 DIAGNOSIS — D638 Anemia in other chronic diseases classified elsewhere: Secondary | ICD-10-CM | POA: Insufficient documentation

## 2014-02-22 DIAGNOSIS — N184 Chronic kidney disease, stage 4 (severe): Secondary | ICD-10-CM | POA: Diagnosis not present

## 2014-02-22 LAB — POCT HEMOGLOBIN-HEMACUE: Hemoglobin: 7.3 g/dL — ABNORMAL LOW (ref 12.0–15.0)

## 2014-02-22 MED ORDER — EPOETIN ALFA 20000 UNIT/ML IJ SOLN
20000.0000 [IU] | INTRAMUSCULAR | Status: DC
  Administered 2014-02-22: 20000 [IU] via SUBCUTANEOUS

## 2014-02-22 MED ORDER — EPOETIN ALFA 20000 UNIT/ML IJ SOLN
INTRAMUSCULAR | Status: AC
  Administered 2014-02-22: 20000 [IU] via SUBCUTANEOUS
  Filled 2014-02-22: qty 1

## 2014-02-22 NOTE — Progress Notes (Signed)
Carolyn Valenzuela spoke with [name] at [facility] and reported hgb of 7.3 today.  Orders given to change pt to weekly and to give her a dose of feraheme today if possible and if not when the patient is available.  The pt decided to receive her iron along with her shot next week to make transportation easier.

## 2014-02-26 ENCOUNTER — Encounter (HOSPITAL_BASED_OUTPATIENT_CLINIC_OR_DEPARTMENT_OTHER): Payer: BC Managed Care – PPO | Attending: Plastic Surgery

## 2014-02-26 DIAGNOSIS — G609 Hereditary and idiopathic neuropathy, unspecified: Secondary | ICD-10-CM | POA: Insufficient documentation

## 2014-02-26 DIAGNOSIS — L97509 Non-pressure chronic ulcer of other part of unspecified foot with unspecified severity: Secondary | ICD-10-CM | POA: Insufficient documentation

## 2014-02-26 DIAGNOSIS — Z79899 Other long term (current) drug therapy: Secondary | ICD-10-CM | POA: Insufficient documentation

## 2014-02-26 DIAGNOSIS — E785 Hyperlipidemia, unspecified: Secondary | ICD-10-CM | POA: Insufficient documentation

## 2014-02-26 DIAGNOSIS — Z7982 Long term (current) use of aspirin: Secondary | ICD-10-CM | POA: Insufficient documentation

## 2014-02-26 DIAGNOSIS — Z794 Long term (current) use of insulin: Secondary | ICD-10-CM | POA: Insufficient documentation

## 2014-02-26 DIAGNOSIS — I96 Gangrene, not elsewhere classified: Secondary | ICD-10-CM | POA: Insufficient documentation

## 2014-02-26 DIAGNOSIS — I739 Peripheral vascular disease, unspecified: Secondary | ICD-10-CM | POA: Insufficient documentation

## 2014-02-26 DIAGNOSIS — E1159 Type 2 diabetes mellitus with other circulatory complications: Secondary | ICD-10-CM | POA: Insufficient documentation

## 2014-02-26 NOTE — Progress Notes (Signed)
[facility]  NAME:  Carolyn Valenzuela, BECKER               ACCOUNT NO.:  [id number]  MEDICAL RECORD NO.:  [id number]      DATE OF BIRTH:  03-Jul-1951  PHYSICIAN:  Theodoro Kos, DO       VISIT DATE:  02/26/2014                                  OFFICE VISIT   HISTORY OF PRESENT ILLNESS:  The patient is a 63 year old female who is here for an exam on her right lower extremity diabetic foot ulcer.  She has undergone some bunion surgery in the recent past.  She has peripheral vascular disease as well.  She was recently seen by Dr. Gwenlyn Found and underwent some vascular studies which showed mild abnormal lower extremity arterial Doppler with a toe pressure 0.39 on the right and left 0.33indicating an adequate perfusion for healing and that was done on in January.  She now presents with dry gangrene in essentially her fourth and fifth toe nonviable.  PAST MEDICAL HISTORY:  Positive for: 1. Diabetes. 2. Hyperlipidemia. 3. Hypertension. 4. Peripheral vascular disease. 5. Peripheral neuropathy.  PAST SURGICAL HISTORY:  She has undergone surgery including bunion surgery, AV fistula in the left arm, and eye surgery.  MEDICATIONS:  Norvasc, aspirin, calcitriol, Coreg, Allegra, Lasix, Amaryl, Norco, Lantus, Zocor, Januvia, Bactrim.  ALLERGIES:  No known drug allergies.  SOCIAL HISTORY:  The patient has some family support.  She is not smoking.  She ambulates with a cane.  She does complain of burning.  PHYSICAL EXAMINATION:  GENERAL:  She is alert, oriented, and cooperative.  She seems to be surprised about the possibility of amputations but expresses understanding the situation. HEENT:  Her pupils are equal. NECK:  She does not have any cervical lymphadenopathy. LUNGS:  Her breathing is unlabored. HEART:  Rate is regular.  Upper extremity pulses are strong.  Lower extremity pulses weak but equal. ABDOMEN:  Soft and nontender.  No organomegaly noted. EXTREMITIES:  She has black  eschar on the lateral aspect of her right foot with gangrene of the fourth and fifth toe and a portion of the third.  There is no draining noted at present.  This does extend up to the dorsal part of her foot and is concerning.  We discussed the possibility of amputation even as far as below-knee amputation.  She acknowledges understanding this and we will send her to Dr. Doran Durand for further evaluation. We will also check a pre-albumin.     Theodoro Kos, DO     CS/MEDQ  D:  02/26/2014  T:  02/26/2014  Job:  [id number]

## 2014-02-28 ENCOUNTER — Other Ambulatory Visit (HOSPITAL_COMMUNITY): Payer: Self-pay | Admitting: *Deleted

## 2014-03-01 ENCOUNTER — Inpatient Hospital Stay (HOSPITAL_COMMUNITY): Admission: RE | Admit: 2014-03-01 | Payer: BC Managed Care – PPO | Source: Ambulatory Visit

## 2014-03-01 ENCOUNTER — Telehealth: Payer: Self-pay | Admitting: *Deleted

## 2014-03-01 DIAGNOSIS — IMO0001 Reserved for inherently not codable concepts without codable children: Secondary | ICD-10-CM

## 2014-03-01 DIAGNOSIS — E1165 Type 2 diabetes mellitus with hyperglycemia: Principal | ICD-10-CM

## 2014-03-01 NOTE — Telephone Encounter (Signed)
Patient has an upcoming appointment.  Lipid, bmet, a1c ordered.

## 2014-03-07 ENCOUNTER — Encounter (HOSPITAL_BASED_OUTPATIENT_CLINIC_OR_DEPARTMENT_OTHER): Payer: BC Managed Care – PPO

## 2014-03-08 ENCOUNTER — Encounter (HOSPITAL_COMMUNITY)
Admission: RE | Admit: 2014-03-08 | Discharge: 2014-03-08 | Disposition: A | Discharge status: Home / Self Care | Payer: BC Managed Care – PPO | Source: Ambulatory Visit | Attending: Nephrology | Admitting: Nephrology

## 2014-03-08 DIAGNOSIS — N184 Chronic kidney disease, stage 4 (severe): Secondary | ICD-10-CM | POA: Insufficient documentation

## 2014-03-08 DIAGNOSIS — D638 Anemia in other chronic diseases classified elsewhere: Secondary | ICD-10-CM | POA: Insufficient documentation

## 2014-03-08 LAB — RENAL FUNCTION PANEL
Albumin: 2.8 g/dL — ABNORMAL LOW (ref 3.5–5.2)
Anion gap: 19 — ABNORMAL HIGH (ref 5–15)
BUN: 64 mg/dL — ABNORMAL HIGH (ref 6–23)
CO2: 23 mEq/L (ref 19–32)
Calcium: 11 mg/dL — ABNORMAL HIGH (ref 8.4–10.5)
Chloride: 92 mEq/L — ABNORMAL LOW (ref 96–112)
Creatinine, Ser: 7.43 mg/dL — ABNORMAL HIGH (ref 0.50–1.10)
GFR calc Af Amer: 6 mL/min — ABNORMAL LOW (ref >90)
GFR calc non Af Amer: 5 mL/min — ABNORMAL LOW (ref >90)
Glucose, Bld: 41 mg/dL — CL (ref 70–99)
Phosphorus: 6 mg/dL — ABNORMAL HIGH (ref 2.3–4.6)
Potassium: 4.3 mEq/L (ref 3.7–5.3)
Sodium: 134 mEq/L — ABNORMAL LOW (ref 137–147)

## 2014-03-08 LAB — IRON AND TIBC
Iron: 24 ug/dL — ABNORMAL LOW (ref 42–135)
Saturation Ratios: 16 % — ABNORMAL LOW (ref 20–55)
TIBC: 146 ug/dL — ABNORMAL LOW (ref 250–470)
UIBC: 122 ug/dL — ABNORMAL LOW (ref 125–400)

## 2014-03-08 LAB — FERRITIN: Ferritin: 1011 ng/mL — ABNORMAL HIGH (ref 10–291)

## 2014-03-08 LAB — POCT HEMOGLOBIN-HEMACUE: Hemoglobin: 7.4 g/dL — ABNORMAL LOW (ref 12.0–15.0)

## 2014-03-08 MED ORDER — SODIUM CHLORIDE 0.9 % IV SOLN
1020.0000 mg | Freq: Once | INTRAVENOUS | Status: AC
  Administered 2014-03-08: 1020 mg via INTRAVENOUS
  Filled 2014-03-08: qty 34

## 2014-03-08 MED ORDER — EPOETIN ALFA 20000 UNIT/ML IJ SOLN
20000.0000 [IU] | INTRAMUSCULAR | Status: DC
  Administered 2014-03-08: 20000 [IU] via SUBCUTANEOUS

## 2014-03-08 MED ORDER — EPOETIN ALFA 20000 UNIT/ML IJ SOLN
INTRAMUSCULAR | Status: DC
Start: 2014-03-08 — End: 2014-03-09
  Filled 2014-03-08: qty 1

## 2014-03-09 ENCOUNTER — Encounter (HOSPITAL_COMMUNITY): Payer: Self-pay | Admitting: Emergency Medicine

## 2014-03-09 ENCOUNTER — Ambulatory Visit: Payer: BC Managed Care – PPO | Admitting: Internal Medicine

## 2014-03-09 ENCOUNTER — Emergency Department (HOSPITAL_COMMUNITY)
Admission: EM | Admit: 2014-03-09 | Discharge: 2014-03-09 | Disposition: A | Discharge status: Home / Self Care | Payer: BC Managed Care – PPO | Attending: Emergency Medicine | Admitting: Emergency Medicine

## 2014-03-09 DIAGNOSIS — I10 Essential (primary) hypertension: Secondary | ICD-10-CM | POA: Insufficient documentation

## 2014-03-09 DIAGNOSIS — Z7982 Long term (current) use of aspirin: Secondary | ICD-10-CM | POA: Insufficient documentation

## 2014-03-09 DIAGNOSIS — N2581 Secondary hyperparathyroidism of renal origin: Secondary | ICD-10-CM | POA: Insufficient documentation

## 2014-03-09 DIAGNOSIS — Z0289 Encounter for other administrative examinations: Secondary | ICD-10-CM

## 2014-03-09 DIAGNOSIS — Z8739 Personal history of other diseases of the musculoskeletal system and connective tissue: Secondary | ICD-10-CM | POA: Insufficient documentation

## 2014-03-09 DIAGNOSIS — Z8709 Personal history of other diseases of the respiratory system: Secondary | ICD-10-CM | POA: Insufficient documentation

## 2014-03-09 DIAGNOSIS — E1169 Type 2 diabetes mellitus with other specified complication: Secondary | ICD-10-CM | POA: Insufficient documentation

## 2014-03-09 DIAGNOSIS — E785 Hyperlipidemia, unspecified: Secondary | ICD-10-CM | POA: Insufficient documentation

## 2014-03-09 DIAGNOSIS — F29 Unspecified psychosis not due to a substance or known physiological condition: Secondary | ICD-10-CM | POA: Insufficient documentation

## 2014-03-09 DIAGNOSIS — Z794 Long term (current) use of insulin: Secondary | ICD-10-CM | POA: Insufficient documentation

## 2014-03-09 DIAGNOSIS — L97509 Non-pressure chronic ulcer of other part of unspecified foot with unspecified severity: Secondary | ICD-10-CM | POA: Insufficient documentation

## 2014-03-09 DIAGNOSIS — Z79899 Other long term (current) drug therapy: Secondary | ICD-10-CM | POA: Insufficient documentation

## 2014-03-09 DIAGNOSIS — Z8742 Personal history of other diseases of the female genital tract: Secondary | ICD-10-CM | POA: Insufficient documentation

## 2014-03-09 DIAGNOSIS — Z862 Personal history of diseases of the blood and blood-forming organs and certain disorders involving the immune mechanism: Secondary | ICD-10-CM | POA: Insufficient documentation

## 2014-03-09 DIAGNOSIS — E11649 Type 2 diabetes mellitus with hypoglycemia without coma: Secondary | ICD-10-CM

## 2014-03-09 DIAGNOSIS — R443 Hallucinations, unspecified: Secondary | ICD-10-CM | POA: Insufficient documentation

## 2014-03-09 LAB — CBG MONITORING, ED
Glucose-Capillary: 154 mg/dL — ABNORMAL HIGH (ref 70–99)
Glucose-Capillary: 57 mg/dL — ABNORMAL LOW (ref 70–99)
Glucose-Capillary: 71 mg/dL (ref 70–99)

## 2014-03-09 LAB — PTH, INTACT AND CALCIUM
Calcium, Total (PTH): 10.5 mg/dL (ref 8.4–10.5)
PTH: 26.2 pg/mL (ref 14.0–72.0)

## 2014-03-09 NOTE — ED Notes (Signed)
NT Dee Reported patient took all home meds against advice.

## 2014-03-09 NOTE — ED Provider Notes (Signed)
CSN: [id number]     Arrival date & time 03/09/14  1034 History   First MD Initiated Contact with Patient 03/09/14 1201     Chief Complaint  Patient presents with  . Hypoglycemia     (Consider location/radiation/quality/duration/timing/severity/associated sxs/prior Treatment) HPI  Carolyn Valenzuela is 16 female with hx DM II, HLD, HTN here with hypoglycemia of 55 at home this morning around 9 :30 AM. She had AMS during this. She was talking nonsensically, agitated, had headache, and does not remember the incident. No LOC. Family was beside her during the episode. EMS was called and after she had some Pepsi, graham cracker, and Peanut butter she got back to her baseline mental status. BG here in the ED was 154.  Right now she feels weak and has mild headache. She has been taking her Lantus and Jannuvia as prescribed but her food intake has been low. She had a bunninectomy on December 12, 2013 and since then she has been feeling weak and has been having episodes of hypoglycemia. She hasn't been to the PCP for dose adjustment of the insulin.   Patient and family is concerned about there right foot because it has not been healing well after the surgery. They consulted with Dr. Doran Durand who plans to do amputation sometime next week. Denies any fever/chills/cp/sob/n/v. Denies any visual changes, numbness, tingling, focal weakness during her episode or currently.   Past Medical History  Diagnosis Date  . DIABETES MELLITUS, UNCONTROLLED 05/21/2009  . HYPERLIPIDEMIA 03/30/2007  . ANEMIA-IRON DEFICIENCY 01/25/2008  . HYPERTENSION 01/25/2008  . SINUSITIS- ACUTE-NOS 01/25/2008  . SECONDARY HYPERPARATHYROIDISM 05/21/2009  . RENAL INSUFFICIENCY 02/01/2008  . CERVICAL RADICULOPATHY, LEFT 01/25/2008  . BACK PAIN 05/02/2009  . NUMBNESS 05/21/2009  . Allergic rhinitis, cause unspecified 12/08/2010  . Foot ulcer     right lateral malleolus  . Critical lower limb ischemia    Past Surgical History  Procedure Laterality Date    . Av fistula placement    . Eye surgery     Family History  Problem Relation Age of Onset  . Cancer Mother     Pancreatic   History  Substance Use Topics  . Smoking status: Never Smoker   . Smokeless tobacco: Never Used  . Alcohol Use: No   OB History   Grav Para Term Preterm Abortions TAB SAB Ect Mult Living                 Review of Systems  Constitutional: Positive for fatigue. Negative for fever, chills, activity change and appetite change.  HENT: Negative for congestion, drooling, ear discharge, ear pain, facial swelling, hearing loss, mouth sores and sinus pressure.   Eyes: Negative for photophobia, pain, discharge and visual disturbance.  Respiratory: Negative for cough, chest tightness, shortness of breath and wheezing.   Cardiovascular: Negative for chest pain, palpitations and leg swelling.  Gastrointestinal: Negative.   Endocrine: Negative for polydipsia and polyuria.  Genitourinary: Negative for dysuria, frequency, hematuria and difficulty urinating.  Skin: Positive for color change.  Allergic/Immunologic: Negative.   Neurological: Positive for light-headedness. Negative for dizziness, tremors, seizures, syncope, facial asymmetry, speech difficulty, weakness and numbness.  Hematological: Negative.   Psychiatric/Behavioral: Positive for hallucinations and confusion.      Allergies  Oxycodone  Home Medications   Prior to Admission medications   Medication Sig Start Date End Date Taking? Authorizing Provider  amLODipine (NORVASC) 2.5 MG tablet TAKE ONE TABLET BY MOUTH TWICE DAILY 06/13/13   Biagio Borg, MD  aspirin 81 MG EC tablet Take 81 mg by mouth daily.      Historical Provider, MD  calcitRIOL (ROCALTROL) 0.5 MCG capsule  09/05/13   Historical Provider, MD  carvedilol (COREG) 25 MG tablet TAKE ONE TABLET BY MOUTH TWICE DAILY 12/19/11   Biagio Borg, MD  fexofenadine (ALLEGRA) 180 MG tablet Take 1 tablet (180 mg total) by mouth daily. 12/08/10 12/08/11  Biagio Borg, MD  furosemide (LASIX) 40 MG tablet TAKE ONE TABLET BY MOUTH TWICE DAILY 06/14/13   Biagio Borg, MD  glimepiride (AMARYL) 4 MG tablet TAKE ONE TABLET BY MOUTH TWICE DAILY 02/10/13   Biagio Borg, MD  HYDROcodone-acetaminophen [facility]) 10-325 MG per tablet Take 1 tablet by mouth at bedtime as needed. 01/05/14   Historical Provider, MD  JANUVIA 100 MG tablet TAKE ONE TABLET BY MOUTH ONCE DAILY 06/14/13   Biagio Borg, MD  LANTUS SOLOSTAR 100 UNIT/ML SOPN INJECT [place] 06/29/13   Biagio Borg, MD  ONE [facility] LANCETS MISC Use as directed two times a day     Historical Provider, MD  simvastatin (ZOCOR) 20 MG tablet Take 1 tablet (20 mg total) by mouth at bedtime. 11/29/13 03/31/15  Biagio Borg, MD  sulfamethoxazole-trimethoprim (BACTRIM DS) 800-160 MG per tablet Take 1 tablet by mouth 2 (two) times daily. 01/05/14   Historical Provider, MD   BP 177/67  Pulse 85  Temp(Src) 98.2 F (36.8 C) (Oral)  Resp 16  Ht 5' (1.524 m)  Wt 124 lb (56.246 kg)  BMI 24.22 kg/m2  SpO2 100% Physical Exam  Constitutional: She is oriented to person, place, and time. She appears well-developed. No distress.  HENT:  Head: Normocephalic and atraumatic.  Right Ear: External ear normal.  Left Ear: External ear normal.  Nose: Nose normal.  Mouth/Throat: Oropharynx is clear and moist.  Eyes: Conjunctivae and EOM are normal. Pupils are equal, round, and reactive to light. Right eye exhibits no discharge. Left eye exhibits no discharge. No scleral icterus.  Neck: Normal range of motion. Neck supple. No JVD present. No thyromegaly present.  Cardiovascular: Normal rate, regular rhythm, normal heart sounds and intact distal pulses.  Exam reveals no gallop and no friction rub.   No murmur heard. Pulmonary/Chest: Effort normal and breath sounds normal. No respiratory distress. She has no wheezes. She has no rales. She exhibits no tenderness.  Abdominal: Soft. Bowel sounds are normal. She exhibits no  distension and no mass. There is no tenderness. There is no rebound and no guarding.  Musculoskeletal: She exhibits tenderness. She exhibits no edema.       Right ankle: She exhibits decreased range of motion. Tenderness.       Feet:  Right foot is wrapped with gauge. Gauge removal revealed necrotic tissue on the dorsum surface of the foot from her recent bunionectomy. She has sensation and tenderness to this area.   Lymphadenopathy:    She has no cervical adenopathy.  Neurological: She is alert and oriented to person, place, and time. She has normal reflexes. No cranial nerve deficit.  Skin: She is not diaphoretic.  Psychiatric: She has a normal mood and affect. Her behavior is normal.    ED Course  Procedures (including critical care time) Labs Review Labs Reviewed  CBG MONITORING, ED - Abnormal; Notable for the following:    Glucose-Capillary 154 (*)    All other components within normal limits    Imaging Review No results found.  EKG Interpretation None      Blood glucose normal here 154.  Will give her some food and recheck blood glucose.   Rechecked BG was 57 after she had some food. Gave her orange juice, crackers, and Peanut butter. Will recheck again.  Recheck BG was 71. Patient remains neurologically and hemodynamically stable.   MDM   Final diagnoses:  Hypoglycemia associated with diabetes   Hypoglycemia likely from low food intake and anti hyperglycemia meds. Have asked her to d/c amaryl as it can cause hypoglycemia. She can continue other anti hypoglycemia as needed based on her sugar checks at home.  Should follow up with PCP ASAP for medication changes.  Dellia Nims, MD 03/09/14 [phone]

## 2014-03-09 NOTE — ED Notes (Signed)
MD Delo at the bedside.  

## 2014-03-09 NOTE — ED Notes (Signed)
CBG 154. 

## 2014-03-09 NOTE — ED Notes (Signed)
Pt states that blood sugar this am was 55 this am.  EMS brought her in and she had graham crackers and peanut butter.  No pain.

## 2014-03-09 NOTE — ED Notes (Signed)
Patient's foot rewrapped. Marya Amsler, RN to wheel patient out with wheelchair.

## 2014-03-09 NOTE — Discharge Instructions (Signed)
Please stop taking amaryl as it can cause low blood sugar. Check your blood sugar at home frequently. Please follow up with your primary care doctor to further manage your blood sugar.   Hypoglycemia Hypoglycemia occurs when the glucose in your blood is too low. Glucose is a type of sugar that is your body's main energy source. Hormones, such as insulin and glucagon, control the level of glucose in the blood. Insulin lowers blood glucose and glucagon increases blood glucose. Having too much insulin in your blood stream, or not eating enough food containing sugar, can result in hypoglycemia. Hypoglycemia can happen to people with or without diabetes. It can develop quickly and can be a medical emergency.  CAUSES   Missing or delaying meals.  Not eating enough carbohydrates at meals.  Taking too much diabetes medicine.  Not timing your oral diabetes medicine or insulin doses with meals, snacks, and exercise.  Nausea and vomiting.  Certain medicines.  Severe illnesses, such as hepatitis, kidney disorders, and certain eating disorders.  Increased activity or exercise without eating something extra or adjusting medicines.  Drinking too much alcohol.  A nerve disorder that affects body functions like your heart rate, blood pressure, and digestion (autonomic neuropathy).  A condition where the stomach muscles do not function properly (gastroparesis). Therefore, medicines and food may not absorb properly.  Rarely, a tumor of the pancreas can produce too much insulin. SYMPTOMS   Hunger.  Sweating (diaphoresis).  Change in body temperature.  Shakiness.  Headache.  Anxiety.  Lightheadedness.  Irritability.  Difficulty concentrating.  Dry mouth.  Tingling or numbness in the hands or feet.  Restless sleep or sleep disturbances.  Altered speech and coordination.  Change in mental status.  Seizures or prolonged convulsions.  Combativeness.  Drowsiness  (lethargic).  Weakness.  Increased heart rate or palpitations.  Confusion.  Pale, gray skin color.  Blurred or double vision.  Fainting. DIAGNOSIS  A physical exam and medical history will be performed. Your caregiver may make a diagnosis based on your symptoms. Blood tests and other lab tests may be performed to confirm a diagnosis. Once the diagnosis is made, your caregiver will see if your signs and symptoms go away once your blood glucose is raised.  TREATMENT  Usually, you can easily treat your hypoglycemia when you notice symptoms.  Check your blood glucose. If it is less than 70 mg/dl, take one of the following:   3-4 glucose tablets.    cup juice.    cup regular soda.   1 cup skim milk.   -1 tube of glucose gel.   5-6 hard candies.   Avoid high-fat drinks or food that may delay a rise in blood glucose levels.  Do not take more than the recommended amount of sugary foods, drinks, gel, or tablets. Doing so will cause your blood glucose to go too high.   Wait 10-15 minutes and recheck your blood glucose. If it is still less than 70 mg/dl or below your target range, repeat treatment.   Eat a snack if it is more than 1 hour until your next meal.  There may be a time when your blood glucose may go so low that you are unable to treat yourself at home when you start to notice symptoms. You may need someone to help you. You may even faint or be unable to swallow. If you cannot treat yourself, someone will need to bring you to the hospital.  [place]  If you have  diabetes, follow your diabetes management plan by:  Taking your medicines as directed.  Following your exercise plan.  Following your meal plan. Do not skip meals. Eat on time.  Testing your blood glucose regularly. Check your blood glucose before and after exercise. If you exercise longer or different than usual, be sure to check blood glucose more frequently.  Wearing your  medical alert jewelry that says you have diabetes.  Identify the cause of your hypoglycemia. Then, develop ways to prevent the recurrence of hypoglycemia.  Do not take a hot bath or shower right after an insulin shot.  Always carry treatment with you. Glucose tablets are the easiest to carry.  If you are going to drink alcohol, drink it only with meals.  Tell friends or family members ways to keep you safe during a seizure. This may include removing hard or sharp objects from the area or turning you on your side.  Maintain a healthy weight. SEEK MEDICAL CARE IF:   You are having problems keeping your blood glucose in your target range.  You are having frequent episodes of hypoglycemia.  You feel you might be having side effects from your medicines.  You are not sure why your blood glucose is dropping so low.  You notice a change in vision or a new problem with your vision. SEEK IMMEDIATE MEDICAL CARE IF:   Confusion develops.  A change in mental status occurs.  The inability to swallow develops.  Fainting occurs. Document Released: 08/17/2005 Document Revised: 08/22/2013 Document Reviewed: 12/14/2011 [facility] Patient Information 2015 [place], [place]. This information is not intended to replace advice given to you by your health care provider. Make sure you discuss any questions you have with your health care provider.

## 2014-03-09 NOTE — ED Notes (Addendum)
MD notified of patient's blood sugar. Patient given orange juice and peanut butter. Waiting to retake blood sugar in fifteen minutes per MD order.

## 2014-03-10 NOTE — ED Provider Notes (Signed)
I saw and evaluated the patient, reviewed the resident's note and I agree with the findings and plan. Patient is a 63 year old female with history of type 2 diabetes. She takes Lantus and Amaryl. She was found to have low blood sugar at home and was not feeling well. She was given by mouth replacement in the field and is feeling better. Her sugar on arrival was 154.  On exam, vitals are stable the patient is afebrile. Head is atraumatic, normocephalic. Neck is supple. Heart is regular rate and rhythm and lungs are clear. Abdomen is benign.  Patient arrived here after a hypoglycemic episode at home. She was given peanut butter, crackers, and orange juice in the emergency department and her sugars improved. On 2 occasions her sugar did drop into the 50s but seems to respond. She was observed for several hours and appeared to be doing well. I feel as though she is appropriate for discharge. She is taking Amaryl which I advised her to stop until she follows up with her primary doctor.      Veryl Speak, MD 03/10/14 [phone]

## 2014-03-13 NOTE — Progress Notes (Addendum)
Anesthesia Chart Review:  Patient is a 63 year old female scheduled for right chopart vs BKA on 03/15/14 by Dr. Doran Durand. I don't currently see a PAT visit scheduled, but I did receive a clearance note from Dr. Nona Dell office that is signed by her PCP Dr. Cathlean Cower.    History includes RLE ischemia with ulceration, non-smoker, DM2, CKD stage IV (secondary to DM/HTN; not yet on HD), secondary hyperparathyroidism, iron deficiency anemia (on Procrit), HTN, brachial neuronitis or radiculitis, back pain, allergic rhinitis, left radiocephalic AVF. Cardiologist is Dr. Quay Burow, last visit 01/09/14 for continued PVD follow-up. Nephrologist is Dr. Corliss Parish with [facility]. Office notes from Dr. Moshe Cipro on 08/02/13 (Media tab) indicate that her creatinine has been in the 6's and 7's with fatigue but otherwise no uremic symptoms. Her previous left Cimino AVF never matured, and now they have not been able to get her to consent to another access procedure. She felt her patient's feet were now the priority issues with understanding that any invasive procedure would put her at risk for needing to start hemodialysis.  Carotid duplex on 01/16/14 showed: Bilateral bulb/proximal ICAs with a mild amount of fibrous plaque with 50-69% diameter reduction. 12 month follow-up was recommended.  I'll follow-up with our scheduler. If our staff are unable to get her in to PAT then her labs, CXR, and EKG will have to be done on the day of surgery.  She has been anemic in the past, so may ultimately need a T&S.    George Hugh [facility]/Anesthesiology Phone [phone] 03/13/2014 5:44 PM  (Update 03/14/2014 1:50 PM:  I am told our staff attempted to contact patient daily over the past few days, but there was no answer or way to leave a voice message.  Staff was able to locate her today at her sister's house, but apparently she could not come in for an appointment. Additional testing and evaluation  on the day of surgery. Her sister told our PAT RN that patient was suppose to get an iron infusion while she was in the hospital.)

## 2014-03-14 ENCOUNTER — Other Ambulatory Visit: Payer: Self-pay | Admitting: Orthopedic Surgery

## 2014-03-14 ENCOUNTER — Encounter (HOSPITAL_COMMUNITY): Payer: Self-pay | Admitting: *Deleted

## 2014-03-14 MED ORDER — SODIUM CHLORIDE 0.9 % IV SOLN: INTRAVENOUS | Status: DC

## 2014-03-14 MED ORDER — ACETAMINOPHEN 500 MG PO TABS
1000.0000 mg | ORAL_TABLET | Freq: Once | ORAL | Status: AC
  Administered 2014-03-15: 1000 mg via ORAL
  Filled 2014-03-14: qty 2

## 2014-03-14 MED ORDER — CHLORHEXIDINE GLUCONATE 4 % EX LIQD
60.0000 mL | Freq: Once | CUTANEOUS | Status: DC
  Filled 2014-03-14: qty 60

## 2014-03-14 MED ORDER — CEFAZOLIN SODIUM-DEXTROSE 2-3 GM-% IV SOLR
2.0000 g | INTRAVENOUS | Status: AC
  Administered 2014-03-15: 2 g via INTRAVENOUS
  Filled 2014-03-14: qty 50

## 2014-03-14 NOTE — Progress Notes (Signed)
Pt is living with her sister, [name]. Sister (with pt in background) verified allergies, medications, medical/surgical history. Gave pre-op instructions to Ms. Scotton.   Ms Minus Liberty states that pt had iron infusion last week here at [facility] and was told by staff that she would need another infusion while she is here for her surgery.

## 2014-03-15 ENCOUNTER — Encounter (HOSPITAL_COMMUNITY): Payer: Self-pay | Admitting: Critical Care Medicine

## 2014-03-15 ENCOUNTER — Inpatient Hospital Stay (HOSPITAL_COMMUNITY)
Admission: RE | Admit: 2014-03-15 | Discharge: 2014-03-20 | DRG: 853 | Disposition: A | Discharge status: Rehabilitation Facility | Payer: BC Managed Care – PPO | Source: Ambulatory Visit | Attending: Orthopedic Surgery | Admitting: Orthopedic Surgery

## 2014-03-15 ENCOUNTER — Encounter (HOSPITAL_COMMUNITY): Payer: BC Managed Care – PPO | Admitting: Vascular Surgery

## 2014-03-15 ENCOUNTER — Inpatient Hospital Stay (HOSPITAL_COMMUNITY): Payer: BC Managed Care – PPO | Admitting: Vascular Surgery

## 2014-03-15 ENCOUNTER — Encounter (HOSPITAL_COMMUNITY)
Admission: RE | Disposition: A | Discharge status: Rehabilitation Facility | Payer: Self-pay | Source: Ambulatory Visit | Attending: Orthopedic Surgery

## 2014-03-15 ENCOUNTER — Inpatient Hospital Stay (HOSPITAL_COMMUNITY): Payer: BC Managed Care – PPO

## 2014-03-15 DIAGNOSIS — A419 Sepsis, unspecified organism: Principal | ICD-10-CM | POA: Diagnosis present

## 2014-03-15 DIAGNOSIS — I96 Gangrene, not elsewhere classified: Secondary | ICD-10-CM

## 2014-03-15 DIAGNOSIS — Z885 Allergy status to narcotic agent status: Secondary | ICD-10-CM

## 2014-03-15 DIAGNOSIS — Z992 Dependence on renal dialysis: Secondary | ICD-10-CM

## 2014-03-15 DIAGNOSIS — D631 Anemia in chronic kidney disease: Secondary | ICD-10-CM | POA: Diagnosis present

## 2014-03-15 DIAGNOSIS — E236 Other disorders of pituitary gland: Secondary | ICD-10-CM | POA: Diagnosis not present

## 2014-03-15 DIAGNOSIS — I70269 Atherosclerosis of native arteries of extremities with gangrene, unspecified extremity: Secondary | ICD-10-CM | POA: Diagnosis present

## 2014-03-15 DIAGNOSIS — N186 End stage renal disease: Secondary | ICD-10-CM

## 2014-03-15 DIAGNOSIS — I10 Essential (primary) hypertension: Secondary | ICD-10-CM | POA: Diagnosis present

## 2014-03-15 DIAGNOSIS — E1152 Type 2 diabetes mellitus with diabetic peripheral angiopathy with gangrene: Secondary | ICD-10-CM | POA: Diagnosis present

## 2014-03-15 DIAGNOSIS — I120 Hypertensive chronic kidney disease with stage 5 chronic kidney disease or end stage renal disease: Secondary | ICD-10-CM | POA: Diagnosis present

## 2014-03-15 DIAGNOSIS — Z7982 Long term (current) use of aspirin: Secondary | ICD-10-CM

## 2014-03-15 DIAGNOSIS — Z794 Long term (current) use of insulin: Secondary | ICD-10-CM

## 2014-03-15 DIAGNOSIS — E1142 Type 2 diabetes mellitus with diabetic polyneuropathy: Secondary | ICD-10-CM | POA: Diagnosis present

## 2014-03-15 DIAGNOSIS — N2581 Secondary hyperparathyroidism of renal origin: Secondary | ICD-10-CM | POA: Diagnosis present

## 2014-03-15 DIAGNOSIS — E1159 Type 2 diabetes mellitus with other circulatory complications: Secondary | ICD-10-CM

## 2014-03-15 DIAGNOSIS — E1165 Type 2 diabetes mellitus with hyperglycemia: Secondary | ICD-10-CM

## 2014-03-15 DIAGNOSIS — E1149 Type 2 diabetes mellitus with other diabetic neurological complication: Secondary | ICD-10-CM | POA: Diagnosis present

## 2014-03-15 DIAGNOSIS — D62 Acute posthemorrhagic anemia: Secondary | ICD-10-CM | POA: Diagnosis not present

## 2014-03-15 DIAGNOSIS — E785 Hyperlipidemia, unspecified: Secondary | ICD-10-CM | POA: Diagnosis present

## 2014-03-15 DIAGNOSIS — N039 Chronic nephritic syndrome with unspecified morphologic changes: Secondary | ICD-10-CM

## 2014-03-15 DIAGNOSIS — D509 Iron deficiency anemia, unspecified: Secondary | ICD-10-CM

## 2014-03-15 DIAGNOSIS — E1122 Type 2 diabetes mellitus with diabetic chronic kidney disease: Secondary | ICD-10-CM | POA: Diagnosis present

## 2014-03-15 DIAGNOSIS — E119 Type 2 diabetes mellitus without complications: Secondary | ICD-10-CM | POA: Diagnosis present

## 2014-03-15 DIAGNOSIS — IMO0001 Reserved for inherently not codable concepts without codable children: Secondary | ICD-10-CM

## 2014-03-15 DIAGNOSIS — L97309 Non-pressure chronic ulcer of unspecified ankle with unspecified severity: Secondary | ICD-10-CM | POA: Diagnosis present

## 2014-03-15 HISTORY — PX: AMPUTATION: SHX166

## 2014-03-15 LAB — GLUCOSE, CAPILLARY
Glucose-Capillary: 257 mg/dL — ABNORMAL HIGH (ref 70–99)
Glucose-Capillary: 298 mg/dL — ABNORMAL HIGH (ref 70–99)
Glucose-Capillary: 247 mg/dL — ABNORMAL HIGH (ref 70–99)
Glucose-Capillary: 262 mg/dL — ABNORMAL HIGH (ref 70–99)
Glucose-Capillary: 210 mg/dL — ABNORMAL HIGH (ref 70–99)

## 2014-03-15 LAB — BASIC METABOLIC PANEL
Anion gap: 20 — ABNORMAL HIGH (ref 5–15)
BUN: 65 mg/dL — ABNORMAL HIGH (ref 6–23)
CO2: 21 mEq/L (ref 19–32)
Calcium: 10.3 mg/dL (ref 8.4–10.5)
Chloride: 91 mEq/L — ABNORMAL LOW (ref 96–112)
Creatinine, Ser: 6.42 mg/dL — ABNORMAL HIGH (ref 0.50–1.10)
GFR calc Af Amer: 7 mL/min — ABNORMAL LOW (ref >90)
GFR calc non Af Amer: 6 mL/min — ABNORMAL LOW (ref >90)
Glucose, Bld: 295 mg/dL — ABNORMAL HIGH (ref 70–99)
Potassium: 4.4 mEq/L (ref 3.7–5.3)
Sodium: 132 mEq/L — ABNORMAL LOW (ref 137–147)

## 2014-03-15 LAB — CBC
HCT: 23.4 % — ABNORMAL LOW (ref 36.0–46.0)
Hemoglobin: 7.4 g/dL — ABNORMAL LOW (ref 12.0–15.0)
MCH: 28.2 pg (ref 26.0–34.0)
MCHC: 31.6 g/dL (ref 30.0–36.0)
MCV: 89.3 fL (ref 78.0–100.0)
Platelets: 709 10*3/uL — ABNORMAL HIGH (ref 150–400)
RBC: 2.62 MIL/uL — ABNORMAL LOW (ref 3.87–5.11)
RDW: 16.2 % — ABNORMAL HIGH (ref 11.5–15.5)
WBC: 21.3 10*3/uL — ABNORMAL HIGH (ref 4.0–10.5)

## 2014-03-15 LAB — HEMOGLOBIN AND HEMATOCRIT, BLOOD
HCT: 12 % — ABNORMAL LOW (ref 36.0–46.0)
HCT: 17.8 % — ABNORMAL LOW (ref 36.0–46.0)
Hemoglobin: 4 g/dL — CL (ref 12.0–15.0)
Hemoglobin: 5.8 g/dL — CL (ref 12.0–15.0)

## 2014-03-15 LAB — PREPARE RBC (CROSSMATCH)

## 2014-03-15 LAB — ABO/RH: ABO/RH(D): O POS

## 2014-03-15 SURGERY — AMPUTATION BELOW KNEE
Anesthesia: General | Site: Knee | Laterality: Right

## 2014-03-15 MED ORDER — VANCOMYCIN HCL IN DEXTROSE 750-5 MG/150ML-% IV SOLN
750.0000 mg | INTRAVENOUS | Status: DC
  Administered 2014-03-15: 750 mg via INTRAVENOUS
  Filled 2014-03-15 (×2): qty 150

## 2014-03-15 MED ORDER — INSULIN ASPART 100 UNIT/ML ~~LOC~~ SOLN
0.0000 [IU] | Freq: Three times a day (TID) | SUBCUTANEOUS | Status: DC
  Administered 2014-03-15: 9 [IU] via SUBCUTANEOUS

## 2014-03-15 MED ORDER — CEFAZOLIN SODIUM 1-5 GM-% IV SOLN: 1.0000 g | Freq: Four times a day (QID) | INTRAVENOUS | Status: DC

## 2014-03-15 MED ORDER — PROPOFOL 10 MG/ML IV BOLUS
INTRAVENOUS | Status: DC | PRN
  Administered 2014-03-15: 110 mg via INTRAVENOUS

## 2014-03-15 MED ORDER — MORPHINE SULFATE 2 MG/ML IJ SOLN
1.0000 mg | INTRAMUSCULAR | Status: DC | PRN
  Administered 2014-03-15: 1 mg via INTRAVENOUS
  Filled 2014-03-15: qty 1

## 2014-03-15 MED ORDER — ADULT MULTIVITAMIN W/MINERALS CH
1.0000 | ORAL_TABLET | Freq: Every day | ORAL | Status: DC
  Administered 2014-03-15 – 2014-03-20 (×6): 1 via ORAL
  Filled 2014-03-15 (×6): qty 1

## 2014-03-15 MED ORDER — MORPHINE SULFATE 2 MG/ML IJ SOLN
INTRAMUSCULAR | Status: AC
  Filled 2014-03-15: qty 1

## 2014-03-15 MED ORDER — AMLODIPINE BESYLATE 2.5 MG PO TABS
2.5000 mg | ORAL_TABLET | Freq: Two times a day (BID) | ORAL | Status: DC
  Administered 2014-03-15 – 2014-03-17 (×5): 2.5 mg via ORAL
  Filled 2014-03-15 (×8): qty 1

## 2014-03-15 MED ORDER — INSULIN GLARGINE 100 UNIT/ML ~~LOC~~ SOLN
55.0000 [IU] | Freq: Every day | SUBCUTANEOUS | Status: DC
  Filled 2014-03-15 (×2): qty 0.55

## 2014-03-15 MED ORDER — ONDANSETRON HCL 4 MG/2ML IJ SOLN
INTRAMUSCULAR | Status: AC
  Filled 2014-03-15: qty 2

## 2014-03-15 MED ORDER — FUROSEMIDE 10 MG/ML IJ SOLN
20.0000 mg | Freq: Once | INTRAMUSCULAR | Status: AC
  Administered 2014-03-16: 20 mg via INTRAVENOUS
  Filled 2014-03-15: qty 2

## 2014-03-15 MED ORDER — MIDAZOLAM HCL 5 MG/5ML IJ SOLN
INTRAMUSCULAR | Status: DC | PRN
  Administered 2014-03-15: 1 mg via INTRAVENOUS

## 2014-03-15 MED ORDER — DOCUSATE SODIUM 100 MG PO CAPS
100.0000 mg | ORAL_CAPSULE | Freq: Two times a day (BID) | ORAL | Status: DC
  Administered 2014-03-15 – 2014-03-16 (×3): 100 mg via ORAL
  Filled 2014-03-15 (×4): qty 1

## 2014-03-15 MED ORDER — FUROSEMIDE 40 MG PO TABS
40.0000 mg | ORAL_TABLET | Freq: Two times a day (BID) | ORAL | Status: DC
  Administered 2014-03-15: 40 mg via ORAL
  Filled 2014-03-15 (×4): qty 1

## 2014-03-15 MED ORDER — INSULIN GLARGINE 100 UNITS/ML SOLOSTAR PEN: 55.0000 [IU] | PEN_INJECTOR | Freq: Every day | SUBCUTANEOUS | Status: DC

## 2014-03-15 MED ORDER — SIMVASTATIN 20 MG PO TABS
20.0000 mg | ORAL_TABLET | Freq: Every day | ORAL | Status: DC
  Administered 2014-03-15 – 2014-03-19 (×5): 20 mg via ORAL
  Filled 2014-03-15 (×6): qty 1

## 2014-03-15 MED ORDER — LINAGLIPTIN 5 MG PO TABS
5.0000 mg | ORAL_TABLET | Freq: Every day | ORAL | Status: DC
  Administered 2014-03-15 – 2014-03-16 (×2): 5 mg via ORAL
  Filled 2014-03-15 (×3): qty 1

## 2014-03-15 MED ORDER — INSULIN ASPART 100 UNIT/ML ~~LOC~~ SOLN
0.0000 [IU] | Freq: Three times a day (TID) | SUBCUTANEOUS | Status: DC
  Administered 2014-03-15: 8 [IU] via SUBCUTANEOUS
  Administered 2014-03-16: 3 [IU] via SUBCUTANEOUS
  Administered 2014-03-16 (×2): 5 [IU] via SUBCUTANEOUS
  Administered 2014-03-17 (×2): 3 [IU] via SUBCUTANEOUS
  Administered 2014-03-18: 5 [IU] via SUBCUTANEOUS

## 2014-03-15 MED ORDER — ASPIRIN EC 81 MG PO TBEC
81.0000 mg | DELAYED_RELEASE_TABLET | Freq: Every day | ORAL | Status: DC
  Administered 2014-03-15 – 2014-03-20 (×6): 81 mg via ORAL
  Filled 2014-03-15 (×6): qty 1

## 2014-03-15 MED ORDER — ONDANSETRON HCL 4 MG/2ML IJ SOLN: 4.0000 mg | Freq: Once | INTRAMUSCULAR | Status: DC | PRN

## 2014-03-15 MED ORDER — ONDANSETRON HCL 4 MG/2ML IJ SOLN
INTRAMUSCULAR | Status: DC | PRN
  Administered 2014-03-15: 4 mg via INTRAVENOUS

## 2014-03-15 MED ORDER — INSULIN ASPART 100 UNIT/ML ~~LOC~~ SOLN
SUBCUTANEOUS | Status: AC
Start: 2014-03-15 — End: 2014-03-16
  Filled 2014-03-15: qty 9

## 2014-03-15 MED ORDER — HYDROCODONE-ACETAMINOPHEN 10-325 MG PO TABS
1.0000 | ORAL_TABLET | ORAL | Status: DC | PRN
  Administered 2014-03-15 – 2014-03-16 (×5): 2 via ORAL
  Filled 2014-03-15 (×4): qty 2
  Filled 2014-03-15: qty 1
  Filled 2014-03-15: qty 2

## 2014-03-15 MED ORDER — SENNA 8.6 MG PO TABS
2.0000 | ORAL_TABLET | Freq: Two times a day (BID) | ORAL | Status: DC
  Administered 2014-03-15 – 2014-03-17 (×4): 17.2 mg via ORAL
  Filled 2014-03-15 (×11): qty 2

## 2014-03-15 MED ORDER — LIDOCAINE HCL (CARDIAC) 20 MG/ML IV SOLN
INTRAVENOUS | Status: DC | PRN
  Administered 2014-03-15: 70 mg via INTRAVENOUS

## 2014-03-15 MED ORDER — ASPIRIN 81 MG PO TBEC: 81.0000 mg | DELAYED_RELEASE_TABLET | Freq: Every day | ORAL | Status: DC

## 2014-03-15 MED ORDER — PHENYLEPHRINE HCL 10 MG/ML IJ SOLN
INTRAMUSCULAR | Status: DC | PRN
  Administered 2014-03-15 (×3): 80 ug via INTRAVENOUS

## 2014-03-15 MED ORDER — PHENYLEPHRINE 40 MCG/ML (10ML) SYRINGE FOR IV PUSH (FOR BLOOD PRESSURE SUPPORT)
PREFILLED_SYRINGE | INTRAVENOUS | Status: AC
Start: 2014-03-15 — End: 2014-03-15
  Filled 2014-03-15: qty 10

## 2014-03-15 MED ORDER — ONDANSETRON HCL 4 MG PO TABS
4.0000 mg | ORAL_TABLET | Freq: Four times a day (QID) | ORAL | Status: DC | PRN
Start: 2014-03-15 — End: 2014-03-16

## 2014-03-15 MED ORDER — MIDAZOLAM HCL 2 MG/2ML IJ SOLN
INTRAMUSCULAR | Status: AC
  Filled 2014-03-15: qty 2

## 2014-03-15 MED ORDER — FERROUS SULFATE 325 (65 FE) MG PO TABS
325.0000 mg | ORAL_TABLET | Freq: Every day | ORAL | Status: DC
  Administered 2014-03-16 – 2014-03-20 (×5): 325 mg via ORAL
  Filled 2014-03-15 (×6): qty 1

## 2014-03-15 MED ORDER — FENTANYL CITRATE 0.05 MG/ML IJ SOLN
INTRAMUSCULAR | Status: DC | PRN
  Administered 2014-03-15: 50 ug via INTRAVENOUS
  Administered 2014-03-15: 25 ug via INTRAVENOUS
  Administered 2014-03-15: 50 ug via INTRAVENOUS
  Administered 2014-03-15 (×5): 25 ug via INTRAVENOUS

## 2014-03-15 MED ORDER — BACITRACIN ZINC 500 UNIT/GM EX OINT
TOPICAL_OINTMENT | CUTANEOUS | Status: DC | PRN
  Administered 2014-03-15: 1 via TOPICAL

## 2014-03-15 MED ORDER — PIPERACILLIN-TAZOBACTAM IN DEX 2-0.25 GM/50ML IV SOLN
2.2500 g | Freq: Three times a day (TID) | INTRAVENOUS | Status: DC
  Administered 2014-03-15 – 2014-03-17 (×5): 2.25 g via INTRAVENOUS
  Filled 2014-03-15 (×9): qty 50

## 2014-03-15 MED ORDER — HYDROMORPHONE HCL PF 1 MG/ML IJ SOLN
0.2500 mg | INTRAMUSCULAR | Status: DC | PRN
  Administered 2014-03-15 (×2): 1 mg via INTRAVENOUS

## 2014-03-15 MED ORDER — HYDRALAZINE HCL 20 MG/ML IJ SOLN
10.0000 mg | Freq: Four times a day (QID) | INTRAMUSCULAR | Status: DC | PRN
  Administered 2014-03-16 – 2014-03-17 (×3): 10 mg via INTRAVENOUS
  Filled 2014-03-15 (×3): qty 1

## 2014-03-15 MED ORDER — HYDROMORPHONE HCL PF 1 MG/ML IJ SOLN
INTRAMUSCULAR | Status: AC
  Filled 2014-03-15: qty 1

## 2014-03-15 MED ORDER — SODIUM CHLORIDE 0.9 % IV SOLN
INTRAVENOUS | Status: AC
  Administered 2014-03-15: 17:00:00 via INTRAVENOUS

## 2014-03-15 MED ORDER — BACITRACIN ZINC 500 UNIT/GM EX OINT
TOPICAL_OINTMENT | CUTANEOUS | Status: AC
  Filled 2014-03-15: qty 15

## 2014-03-15 MED ORDER — ONDANSETRON HCL 4 MG/2ML IJ SOLN
4.0000 mg | Freq: Four times a day (QID) | INTRAMUSCULAR | Status: DC | PRN
  Administered 2014-03-17: 4 mg via INTRAVENOUS
  Filled 2014-03-15: qty 2

## 2014-03-15 MED ORDER — SODIUM CHLORIDE 0.9 % IV SOLN
INTRAVENOUS | Status: DC
  Administered 2014-03-15 (×2): via INTRAVENOUS

## 2014-03-15 MED ORDER — FENTANYL CITRATE 0.05 MG/ML IJ SOLN
INTRAMUSCULAR | Status: AC
  Filled 2014-03-15: qty 5

## 2014-03-15 MED ORDER — CALCITRIOL 0.5 MCG PO CAPS
0.5000 ug | ORAL_CAPSULE | Freq: Every day | ORAL | Status: DC
  Administered 2014-03-15 – 2014-03-20 (×6): 0.5 ug via ORAL
  Filled 2014-03-15 (×6): qty 1

## 2014-03-15 SURGICAL SUPPLY — 59 items
BANDAGE ELASTIC 6 VELCRO ST LF (GAUZE/BANDAGES/DRESSINGS) ×2 IMPLANT
BANDAGE ESMARK 6X9 LF (GAUZE/BANDAGES/DRESSINGS) IMPLANT
BLADE SAW RECIP 87.9 MT (BLADE) ×2 IMPLANT
BNDG CMPR 9X6 STRL LF SNTH (GAUZE/BANDAGES/DRESSINGS)
BNDG COHESIVE 6X5 TAN STRL LF (GAUZE/BANDAGES/DRESSINGS) ×4 IMPLANT
BNDG ESMARK 6X9 LF (GAUZE/BANDAGES/DRESSINGS)
CANISTER SUCT 3000ML (MISCELLANEOUS) ×2 IMPLANT
CHLORAPREP W/TINT 26ML (MISCELLANEOUS) ×2 IMPLANT
COVER SURGICAL LIGHT HANDLE (MISCELLANEOUS) ×2 IMPLANT
CUFF TOURNIQUET SINGLE 24IN (TOURNIQUET CUFF) ×2 IMPLANT
CUFF TOURNIQUET SINGLE 34IN LL (TOURNIQUET CUFF) IMPLANT
CUFF TOURNIQUET SINGLE 44IN (TOURNIQUET CUFF) IMPLANT
DRAPE EXTREMITY T 121X128X90 (DRAPE) ×2 IMPLANT
DRAPE INCISE IOBAN 66X45 STRL (DRAPES) ×2 IMPLANT
DRAPE PROXIMA HALF (DRAPES) ×4 IMPLANT
DRAPE U-SHAPE 47X51 STRL (DRAPES) ×4 IMPLANT
DRSG MEPITEL 4X7.2 (GAUZE/BANDAGES/DRESSINGS) ×2 IMPLANT
DRSG PAD ABDOMINAL 8X10 ST (GAUZE/BANDAGES/DRESSINGS) ×6 IMPLANT
ELECT CAUTERY BLADE 6.4 (BLADE) IMPLANT
ELECT REM PT RETURN 9FT ADLT (ELECTROSURGICAL) ×2
ELECTRODE REM PT RTRN 9FT ADLT (ELECTROSURGICAL) ×1 IMPLANT
EVACUATOR 1/8 PVC DRAIN (DRAIN) IMPLANT
GLOVE BIO SURGEON STRL SZ7 (GLOVE) ×2 IMPLANT
GLOVE BIO SURGEON STRL SZ8 (GLOVE) ×2 IMPLANT
GLOVE BIOGEL PI IND STRL 7.5 (GLOVE) ×1 IMPLANT
GLOVE BIOGEL PI IND STRL 8 (GLOVE) ×1 IMPLANT
GLOVE BIOGEL PI INDICATOR 7.5 (GLOVE) ×1
GLOVE BIOGEL PI INDICATOR 8 (GLOVE) ×1
GOWN STRL REUS W/ TWL LRG LVL3 (GOWN DISPOSABLE) ×1 IMPLANT
GOWN STRL REUS W/ TWL XL LVL3 (GOWN DISPOSABLE) ×1 IMPLANT
GOWN STRL REUS W/TWL LRG LVL3 (GOWN DISPOSABLE) ×2
GOWN STRL REUS W/TWL XL LVL3 (GOWN DISPOSABLE) ×2
IMMOBILIZER KNEE 22 (SOFTGOODS) ×2 IMPLANT
KIT BASIN OR (CUSTOM PROCEDURE TRAY) ×2 IMPLANT
KIT ROOM TURNOVER OR (KITS) ×2 IMPLANT
LIGACLIP MED LC202 (MISCELLANEOUS) ×2 IMPLANT
NS IRRIG 1000ML POUR BTL (IV SOLUTION) ×2 IMPLANT
PACK GENERAL/GYN (CUSTOM PROCEDURE TRAY) ×2 IMPLANT
PAD ARMBOARD 7.5X6 YLW CONV (MISCELLANEOUS) ×4 IMPLANT
PAD CAST 4YDX4 CTTN HI CHSV (CAST SUPPLIES) ×1 IMPLANT
PADDING CAST COTTON 4X4 STRL (CAST SUPPLIES) ×2
PADDING CAST COTTON 6X4 STRL (CAST SUPPLIES) ×2 IMPLANT
SPONGE GAUZE 4X4 12PLY (GAUZE/BANDAGES/DRESSINGS) ×2 IMPLANT
SPONGE LAP 18X18 X RAY DECT (DISPOSABLE) IMPLANT
STAPLER VISISTAT 35W (STAPLE) IMPLANT
STOCKINETTE IMPERVIOUS LG (DRAPES) IMPLANT
SUT ETHILON 2 0 FS 18 (SUTURE) ×6 IMPLANT
SUT MNCRL AB 3-0 PS2 18 (SUTURE) ×2 IMPLANT
SUT PDS 2 0 (SUTURE) IMPLANT
SUT PDS AB 0 CT 36 (SUTURE) ×2 IMPLANT
SUT PROLENE 3 0 PS 2 (SUTURE) ×2 IMPLANT
SUT PROLENE 3 0 SH 48 (SUTURE) IMPLANT
SUT SILK 2 0 (SUTURE) ×6
SUT SILK 2-0 18XBRD TIE 12 (SUTURE) ×3 IMPLANT
SWAB COLLECTION DEVICE MRSA (MISCELLANEOUS) IMPLANT
TOWEL OR 17X24 6PK STRL BLUE (TOWEL DISPOSABLE) ×2 IMPLANT
TOWEL OR 17X26 10 PK STRL BLUE (TOWEL DISPOSABLE) ×2 IMPLANT
TUBE ANAEROBIC SPECIMEN COL (MISCELLANEOUS) IMPLANT
WATER STERILE IRR 1000ML POUR (IV SOLUTION) ×2 IMPLANT

## 2014-03-15 NOTE — Anesthesia Procedure Notes (Signed)
Procedure Name: LMA Insertion Performed by: Jabriel Vanduyne BROWN Pre-anesthesia Checklist: Patient identified, Timeout performed, Emergency Drugs available, Suction available and Patient being monitored Patient Re-evaluated:Patient Re-evaluated prior to inductionOxygen Delivery Method: Circle system utilized Preoxygenation: Pre-oxygenation with 100% oxygen Intubation Type: IV induction Ventilation: Mask ventilation without difficulty LMA: LMA inserted LMA Size: 4.0 Number of attempts: 1 Placement Confirmation: positive ETCO2,  CO2 detector and breath sounds checked- equal and bilateral Tube secured with: Tape Dental Injury: Teeth and Oropharynx as per pre-operative assessment

## 2014-03-15 NOTE — Transfer of Care (Signed)
Immediate Anesthesia Transfer of Care Note  Patient: Carolyn Valenzuela  Procedure(s) Performed: Procedure(s): RIGHT  LEG  BELOW KNEE AMPUTATION  (Right)  Patient Location: PACU  Anesthesia Type:General  Level of Consciousness: awake and alert   Airway & Oxygen Therapy: Patient Spontanous Breathing and Patient connected to nasal cannula oxygen  Post-op Assessment: Report given to PACU RN, Post -op Vital signs reviewed and stable and Patient moving all extremities  Post vital signs: Reviewed and stable  Complications: No apparent anesthesia complications

## 2014-03-15 NOTE — Significant Event (Signed)
Rapid Response Event Note Called to room 5 N30 for pt with low hgb 4 and elevated wbc post op r bka. Prior to my arrival Surgical PA and [facility] hospitalist called, orders received to repeat cbc and transfer pt.  Overview: Time Called: 1955 Arrival Time: 2000 Event Type: Cardiac;Hypotension;Other (Comment)  Initial Focused Assessment: Pt awake and oriented complains of right leg pain, just arrived from PACU s/p right BKA. Lungs clear, heart sounds wnl. Right stump dressing intact. HGB  repeated per [facility] MD order yielding 5.8.  Interventions: Family and patient updated on plan of care. Ortho PA notified of repeated cbc results requested RN to call [facility]. Dr. Conley Canal [facility] MD d/c'd order for transfer.  2 units blood to be transfused tonight. Constance RN advised to monitor pt closely and notify myself and MD of any further issues  Event Summary: Name of Physician Notified: Geraldine Solar PA at 2000/2115  Name of Consulting Physician Notified: [facility] Dr. Conley Canal at 2005/2120  Pt stabilized and remains on [place], [place]

## 2014-03-15 NOTE — Anesthesia Postprocedure Evaluation (Signed)
  Anesthesia Post-op Note  Patient: Carolyn Valenzuela  Procedure(s) Performed: Procedure(s): RIGHT  LEG  BELOW KNEE AMPUTATION  (Right)  Patient Location: PACU  Anesthesia Type:General  Level of Consciousness: awake, sedated and patient cooperative  Airway and Oxygen Therapy: Patient Spontanous Breathing  Post-op Pain: mild  Post-op Assessment: Post-op Vital signs reviewed, Patient's Cardiovascular Status Stable, Respiratory Function Stable, Patent Airway and No signs of Nausea or vomiting  Post-op Vital Signs: stable  Last Vitals:  Filed Vitals:   03/15/14 1557  BP: 114/58  Pulse: 73  Temp: 36.8 C  Resp: 13    Complications: No apparent anesthesia complications

## 2014-03-15 NOTE — Progress Notes (Signed)
CRITICAL VALUE ALERT  Critical value received:  Hemoglobin 4.0  Date of notification:  03/15/14  Time of notification:  20:10  Critical value read back:Yes.    Nurse who received alert:  Judieth Keens, RN  MD notified (1st page):  Dr. Conley Canal  Time of first page:  19:54  MD notified (2nd page):   Time of second page:  Responding MD:  Dr. Conley Canal  Time MD responded:  19:55 Initially, contacted on call for Dr. Doran Durand.  Jake Bathe, PA responded.  Orders to transfer to step down unit.  Instructed to contact Internal Medicine on-call. Dr. Conley Canal contacted, ordered stat H and H.  Rapid response contacted. Will continue to monitor patient and contact Dr. Conley Canal with updated lab value.

## 2014-03-15 NOTE — H&P (Signed)
Carolyn Valenzuela is an 63 y.o. female.   Chief Complaint:  Right foot gangrene HPI:  63 y/o female with complicated PMH presents today with right foot progressive gangrene.  She presents now for right BKA.  Past Medical History  Diagnosis Date  . DIABETES MELLITUS, UNCONTROLLED 05/21/2009  . HYPERLIPIDEMIA 03/30/2007  . ANEMIA-IRON DEFICIENCY 01/25/2008  . HYPERTENSION 01/25/2008  . SINUSITIS- ACUTE-NOS 01/25/2008  . SECONDARY HYPERPARATHYROIDISM 05/21/2009  . RENAL INSUFFICIENCY 02/01/2008  . CERVICAL RADICULOPATHY, LEFT 01/25/2008  . BACK PAIN 05/02/2009  . NUMBNESS 05/21/2009  . Allergic rhinitis, cause unspecified 12/08/2010  . Foot ulcer     right lateral malleolus  . Critical lower limb ischemia     Past Surgical History  Procedure Laterality Date  . Av fistula placement Left   . Eye surgery Bilateral     cataracts removed, left eye still has some oil in it.  . Colonoscopy      Family History  Problem Relation Age of Onset  . Cancer Mother     Pancreatic   Social History:  reports that she has never smoked. She has never used smokeless tobacco. She reports that she does not drink alcohol or use illicit drugs.  Allergies:  Allergies  Allergen Reactions  . Oxycodone Itching    Medications Prior to Admission  Medication Sig Dispense Refill  . amLODipine (NORVASC) 2.5 MG tablet Take 2.5 mg by mouth 2 (two) times daily.      [name] aspirin 81 MG EC tablet Take 81 mg by mouth daily.        . calcitRIOL (ROCALTROL) 0.5 MCG capsule Take 0.5 mcg by mouth daily.       . ferrous sulfate (FERROUSUL) 325 (65 FE) MG tablet Take 325 mg by mouth daily with breakfast.      . furosemide (LASIX) 40 MG tablet Take 40 mg by mouth 2 (two) times daily.      . Multiple Vitamin (MULTIVITAMIN WITH MINERALS) TABS tablet Take 1 tablet by mouth daily.      [name] oxyCODONE-acetaminophen (PERCOCET) 10-325 MG per tablet Take 1 tablet by mouth every 4 (four) hours as needed for pain.      . simvastatin (ZOCOR) 20  MG tablet Take 1 tablet (20 mg total) by mouth at bedtime.  90 tablet  3  . sitaGLIPtin (JANUVIA) 100 MG tablet Take 100 mg by mouth daily.      . insulin glargine (LANTUS) 100 unit/mL SOPN Inject 55 Units into the skin at bedtime.        Results for orders placed during the hospital encounter of 03/15/14 (from the past 48 hour(s))  CBC     Status: Abnormal   Collection Time    03/15/14 12:00 PM      Result Value Ref Range   WBC 21.3 (*) 4.0 - 10.5 K/uL   RBC 2.62 (*) 3.87 - 5.11 MIL/uL   Hemoglobin 7.4 (*) 12.0 - 15.0 g/dL   HCT 23.4 (*) 36.0 - 46.0 %   MCV 89.3  78.0 - 100.0 fL   MCH 28.2  26.0 - 34.0 pg   MCHC 31.6  30.0 - 36.0 g/dL   RDW 16.2 (*) 11.5 - 15.5 %   Platelets 709 (*) 150 - 400 K/uL  BASIC METABOLIC PANEL     Status: Abnormal   Collection Time    03/15/14 12:00 PM      Result Value Ref Range   Sodium 132 (*) 137 - 147 mEq/L  Potassium 4.4  3.7 - 5.3 mEq/L   Chloride 91 (*) 96 - 112 mEq/L   CO2 21  19 - 32 mEq/L   Glucose, Bld 295 (*) 70 - 99 mg/dL   BUN 65 (*) 6 - 23 mg/dL   Creatinine, Ser 6.42 (*) 0.50 - 1.10 mg/dL   Calcium 10.3  8.4 - 10.5 mg/dL   GFR calc non Af Amer 6 (*) >90 mL/min   GFR calc Af Amer 7 (*) >90 mL/min   Comment: (NOTE)     The eGFR has been calculated using the CKD EPI equation.     This calculation has not been validated in all clinical situations.     eGFR's persistently <90 mL/min signify possible Chronic Kidney     Disease.   Anion gap 20 (*) 5 - 15  GLUCOSE, CAPILLARY     Status: Abnormal   Collection Time    03/30/2014 12:37 PM      Result Value Ref Range   Glucose-Capillary 257 (*) 70 - 99 mg/dL  GLUCOSE, CAPILLARY     Status: Abnormal   Collection Time    30-Mar-2014  2:39 PM      Result Value Ref Range   Glucose-Capillary 298 (*) 70 - 99 mg/dL   Dg Chest 2 View  Mar 30, 2014   CLINICAL DATA:  Preop prior to foot surgery; history of diabetes and hypertension  EXAM: CHEST  2 VIEW  COMPARISON:  PA and lateral chest x-ray dated  March 26, 2010  FINDINGS: The lungs are adequately inflated and clear. The heart is top-normal in size. The pulmonary vascularity is normal. There is no pleural effusion. The bony thorax is unremarkable.  IMPRESSION: There is no active cardiopulmonary disease.   Electronically Signed   By: David  Martinique   On: Mar 30, 2014 12:37    ROS  No recent f/c/n/v/wt loss  Blood pressure 206/62, pulse 90, temperature 99.2 F (37.3 C), temperature source Oral, resp. rate 18, SpO2 98.00%. Physical Exam  Thin cachectic woman appearing older than her stated age.  Alert and oriented x 4.  Mood and affect normal.  EOMi.  resp unlabored.  R LE with necrotic forefoot to the level of the distal ankle.  5/5 strength at ankle in PF and DF.  No palpable pulses.  No lymphadenopathy.    Assessment/Plan Right foot gangrene - to OR for right BKA.  The risks and benefits of the alternative treatment options have been discussed in detail.  The patient wishes to proceed with surgery and specifically understands risks of bleeding, infection, nerve damage, blood clots, need for additional surgery, amputation and death.   Carolyn Valenzuela 2014-03-30, 2:45 PM

## 2014-03-15 NOTE — Brief Op Note (Signed)
03/15/2014  3:56 PM  PATIENT:  Carolyn Valenzuela Comment  63 y.o. female  PRE-OPERATIVE DIAGNOSIS:  RIGHT FOOT GANGRENE  POST-OPERATIVE DIAGNOSIS:  RIGHT FOOT GANGRENE  Procedure(s): RIGHT  LEG  BELOW KNEE AMPUTATION   SURGEON:  [name], MD  ASSISTANT: n/a  ANESTHESIA:   General  EBL:  200 cc  TOURNIQUET:   Total Tourniquet Time Documented: Thigh (Right) - 9 minutes Total: Thigh (Right) - 9 minutes   COMPLICATIONS:  None apparent  DISPOSITION:  Extubated, awake and stable to recovery.  DICTATION ID:  [id number]

## 2014-03-15 NOTE — Progress Notes (Signed)
CRITICAL VALUE ALERT  Critical value received:  Hemoglobin 4.0  Date of notification:  03/15/14  Time of notification:  19:44  Critical value read back:Yes.    Nurse who received alert:  Judieth Keens, RN  MD notified (1st page):  Dr. Doran Durand   Time of first page:  19:48  MD notified (2nd page):   Time of second page:  Responding MD:  Jake Bathe, PA  Time MD responded:  19:52

## 2014-03-15 NOTE — Consult Note (Signed)
Patient Demographics  Carolyn Valenzuela, is a 63 y.o. female   MRN: [id number]   DOB - [birth year]  Admit Date - 03/15/2014    Outpatient Primary MD for the patient is Cathlean Cower, MD  Consult requested in the Hospital by [name], MD, On 03/15/2014    Reason for consult management of DM-2   With History of -   Past Medical History  Diagnosis Date  . DIABETES MELLITUS, UNCONTROLLED 05/21/2009  . HYPERLIPIDEMIA 03/30/2007  . ANEMIA-IRON DEFICIENCY 01/25/2008  . HYPERTENSION 01/25/2008  . SINUSITIS- ACUTE-NOS 01/25/2008  . SECONDARY HYPERPARATHYROIDISM 05/21/2009  . RENAL INSUFFICIENCY 02/01/2008  . CERVICAL RADICULOPATHY, LEFT 01/25/2008  . BACK PAIN 05/02/2009  . NUMBNESS 05/21/2009  . Allergic rhinitis, cause unspecified 12/08/2010  . Foot ulcer     right lateral malleolus  . Critical lower limb ischemia        Past Surgical History  Procedure Laterality Date  . Av fistula placement Left   . Eye surgery Bilateral     cataracts removed, left eye still has some oil in it.  . Colonoscopy      in for   Right leg BKA    HPI  Carolyn Valenzuela  is a 63 y.o. female, with H/O DM-2, HTN, Dyslipidemia, ESRD not yet on dialysis follows with Dr. Moshe Cipro, chronic iron deficiency anemia requiring frequent iron transfusions, right foot infection with recent diagnosis of wet diabetic gangrene who was admitted by Dr. Doran Durand orthopedic surgeon for a semiurgent right BKA, patient currently in PACU how was called by Dr. Doran Durand to see the patient in PACU for management of her medical problems which include type 2 diabetes mellitus, hypertension, dyslipidemia and chronic iron deficiency anemia along with ESRD not yet on dialysis.  Patient currently in PACU, besides right leg post BKA stump pain she has no other subjective complaints, she denies  any headache, no chest abdominal shortness of breath. Her preop workup shows a marked leukocytosis, significant anemia, ESRD, elevated blood sugars.    Review of Systems    .   Social History History  Substance Use Topics  . Smoking status: Never Smoker   . Smokeless tobacco: Never Used  . Alcohol Use: No      Family History Family History  Problem Relation Age of Onset  . Cancer Mother     Pancreatic      Prior to Admission medications   Medication Sig Start Date End Date Taking? Authorizing Provider  amLODipine (NORVASC) 2.5 MG tablet Take 2.5 mg by mouth 2 (two) times daily.   Yes Historical Provider, MD  aspirin 81 MG EC tablet Take 81 mg by mouth daily.     Yes Historical Provider, MD  calcitRIOL (ROCALTROL) 0.5 MCG capsule Take 0.5 mcg by mouth daily.  09/05/13  Yes Historical Provider, MD  ferrous sulfate (FERROUSUL) 325 (65 FE) MG tablet Take 325 mg by mouth daily with breakfast.   Yes Historical Provider, MD  furosemide (LASIX) 40 MG tablet Take 40 mg  by mouth 2 (two) times daily.   Yes Historical Provider, MD  Multiple Vitamin (MULTIVITAMIN WITH MINERALS) TABS tablet Take 1 tablet by mouth daily.   Yes Historical Provider, MD  oxyCODONE-acetaminophen (PERCOCET) 10-325 MG per tablet Take 1 tablet by mouth every 4 (four) hours as needed for pain.   Yes Historical Provider, MD  simvastatin (ZOCOR) 20 MG tablet Take 1 tablet (20 mg total) by mouth at bedtime. 11/29/13 03/31/15 Yes Biagio Borg, MD  sitaGLIPtin (JANUVIA) 100 MG tablet Take 100 mg by mouth daily.   Yes Historical Provider, MD  insulin glargine (LANTUS) 100 unit/mL SOPN Inject 55 Units into the skin at bedtime.    Historical Provider, MD    Anti-infectives   Start     Dose/Rate Route Frequency Ordered Stop   03/15/14 0600  ceFAZolin (ANCEF) IVPB 2 g/50 mL premix     2 g 100 mL/hr over 30 Minutes Intravenous On call to O.R. 03/14/14 1432 03/15/14 1454      Scheduled Meds: . HYDROmorphone      .  HYDROmorphone      . insulin aspart      . insulin aspart  0-15 Units Subcutaneous TID WC  . insulin aspart  0-9 Units Subcutaneous TID WC   Continuous Infusions: . sodium chloride 50 mL/hr at 03/15/14 1248   PRN Meds:.HYDROcodone-acetaminophen  Allergies  Allergen Reactions  . Oxycodone Itching    Physical Exam  Vitals  Blood pressure 114/58, pulse 73, temperature 98.3 F (36.8 C), temperature source Oral, resp. rate 13, SpO2 98.00%.   1. General AA female lying in bed in mild R leg pain  2. Normal affect and insight, Not Suicidal or Homicidal, Awake Alert, Oriented X 3.  3. No F.N deficits, ALL C.Nerves Intact, Strength 5/5 all 4 extremities, Sensation intact all 4 extremities, Plantars down going.  4. Ears and Eyes appear Normal, Conjunctivae clear, PERRLA. Moist Oral Mucosa.  5. Supple Neck, No JVD, No cervical lymphadenopathy appriciated, No Carotid Bruits.  6. Symmetrical Chest wall movement, Good air movement bilaterally, CTAB.  7. RRR, No Gallops, Rubs or Murmurs, No Parasternal Heave.  8. Positive Bowel Sounds, Abdomen Soft, No tenderness, No organomegaly appriciated, No rebound -guarding or rigidity.  9.  No Cyanosis, Normal Skin Turgor, No Skin Rash or Bruise, R BKA stump in bandage  10. Good muscle tone,  joints appear normal , no effusions, Normal ROM.  11. No Palpable Lymph Nodes in Neck or Axillae     Data Review  CBC  Recent Labs Lab 03/15/14 1200  WBC 21.3*  HGB 7.4*  HCT 23.4*  PLT 709*  MCV 89.3  MCH 28.2  MCHC 31.6  RDW 16.2*   ------------------------------------------------------------------------------------------------------------------  Chemistries   Recent Labs Lab 03/15/14 1200  NA 132*  K 4.4  CL 91*  CO2 21  GLUCOSE 295*  BUN 65*  CREATININE 6.42*  CALCIUM 10.3   ------------------------------------------------------------------------------------------------------------------ CrCl is unknown because both a  height and weight (above a minimum accepted value) are required for this calculation. ------------------------------------------------------------------------------------------------------------------ No results found for this basename: TSH, T4TOTAL, FREET3, T3FREE, THYROIDAB,  in the last 72 hours   Coagulation profile No results found for this basename: INR, PROTIME,  in the last 168 hours ------------------------------------------------------------------------------------------------------------------- No results found for this basename: DDIMER,  in the last 72 hours -------------------------------------------------------------------------------------------------------------------  Cardiac Enzymes No results found for this basename: CK, CKMB, TROPONINI, MYOGLOBIN,  in the last 168 hours ------------------------------------------------------------------------------------------------------------------ No components found with this basename: POCBNP,    ---------------------------------------------------------------------------------------------------------------  Urinalysis    Component Value Date/Time   COLORURINE Yellow 07/08/2011 [place] 07/08/2011 1705   LABSPEC 1.010 07/08/2011 1705   PHURINE 6.0 07/08/2011 1705   GLUCOSEU 500 07/08/2011 1705   HGBUR SMALL 07/08/2011 1705   BILIRUBINUR NEGATIVE 07/08/2011 1705   KETONESUR NEGATIVE 07/08/2011 1705   UROBILINOGEN 0.2 07/08/2011 1705   NITRITE NEGATIVE 07/08/2011 1705   LEUKOCYTESUR NEGATIVE 07/08/2011 1705     Imaging results:   Dg Chest 2 View  03/15/2014   CLINICAL DATA:  Preop prior to foot surgery; history of diabetes and hypertension  EXAM: CHEST  2 VIEW  COMPARISON:  PA and lateral chest x-ray dated March 26, 2010  FINDINGS: The lungs are adequately inflated and clear. The heart is top-normal in size. The pulmonary vascularity is normal. There is no pleural effusion. The bony thorax is unremarkable.  IMPRESSION:  There is no active cardiopulmonary disease.   Electronically Signed   By: David  Martinique   On: 03/15/2014 12:37    My personal review of EKG: Rhythm NSR, Rate  82 /min,  no Acute ST changes    Assessment & Plan   1. Right foot wet diabetic gangrene with evidence of leukocytosis and mild sepsis. She is currently status post right BKA, will defer management of this problem to primary team which is orthopedics. According to Dr. Doran Durand amputation margin was clean and free of infection, we'll give her a dose of IV Vanco and Zosyn to be dosed by pharmacy, I will draw set of blood cultures and thereafter monitor clinically.    2. DM 2. Check A1c, continue Lantus at sliding scale. Continue oral trajenta.    3. Hypertension. On Norvasc we'll add as needed hydralazine.   4. ESRD. Currently not on dialysis, she has a nonfunctioning fistula, follows with Dr. Moshe Cipro, will monitor renal function and symptoms of uremia. Creatinine actually appears to be better than baseline currently.    5. Dyslipidemia. Continue home dose statin to    6. Chronic severe iron deficiency anemia. Requiring frequent outpatient iron transfusions, will expect some more drop in H&H due to operative blood loss and dilution from IV fluids she is getting in PACU, type screen, repeat H&H in the morning goal try to keep hemoglobin above 7.     DVT Prophylaxis per primary MD (orthopedics - consider Heparin 5000 units SQ TID)  AM Labs Ordered, also please review Full Orders  Family Communication: Plan discussed with patient and    Thank you for the consult, we will follow the patient with you in the Hospital.   Lala Lund K M.D on 03/15/2014 at 4:33 PM  Between 7am to 7pm - Pager - [phone]  After 7pm go to [email or web address] - password [email or web address]  And look for the night coverage person covering me after hours   Thank you for the consult, we will follow the patient with you in the [facility] Office  [phone]   **Disclaimer: This note may have been dictated with voice recognition software. Similar sounding words can inadvertently be transcribed and this note may contain transcription errors which may not have been corrected upon publication of note.**

## 2014-03-15 NOTE — Anesthesia Preprocedure Evaluation (Signed)
Anesthesia Evaluation  Patient identified by MRN, date of birth, ID band Patient awake    Reviewed: Allergy & Precautions, H&P , NPO status   Airway       Dental   Pulmonary          Cardiovascular hypertension, + Peripheral Vascular Disease     Neuro/Psych  Neuromuscular disease    GI/Hepatic   Endo/Other  diabetes, Type 2  Renal/GU Renal InsufficiencyRenal disease     Musculoskeletal   Abdominal   Peds  Hematology  (+) anemia ,   Anesthesia Other Findings   Reproductive/Obstetrics                           Anesthesia Physical Anesthesia Plan  ASA: III  Anesthesia Plan: General   Post-op Pain Management:    Induction: Intravenous  Airway Management Planned: Oral ETT and LMA  Additional Equipment:   Intra-op Plan:   Post-operative Plan: Extubation in OR  Informed Consent:   Plan Discussed with:   Anesthesia Plan Comments:         Anesthesia Quick Evaluation

## 2014-03-15 NOTE — Progress Notes (Addendum)
Called with hgb 4.0 down from 7.4. Blood pressure and HR ok. No reported bleeding by RN. Will repeat hgb stat to confirm and transfuse if, in fact low.  Doree Barthel, MD [facility] [phone]  Repeat hgb 5.8. Will transfuse 2 units PRBC and lasix. Consent already on chart.  Doree Barthel, MD

## 2014-03-15 NOTE — Progress Notes (Signed)
ANTIBIOTIC CONSULT NOTE - INITIAL  Pharmacy Consult for vanc/zosyn Indication: gangrene  Allergies  Allergen Reactions  . Oxycodone Itching    Patient Measurements:    Vital Signs: Temp: 98 F (36.7 C) (07/16 1630) Temp src: Oral (07/16 1223) BP: 126/58 mmHg (07/16 1630) Pulse Rate: 78 (07/16 1615) Intake/Output from previous day:   Intake/Output from this shift: Total I/O In: 900 [I.V.:900] Out: 200 [Blood:200]  Labs:  Recent Labs  03/15/14 1200  WBC 21.3*  HGB 7.4*  PLT 709*  CREATININE 6.42*   The CrCl is unknown because both a height and weight (above a minimum accepted value) are required for this calculation. No results found for this basename: VANCOTROUGH, VANCOPEAK, VANCORANDOM, GENTTROUGH, GENTPEAK, GENTRANDOM, TOBRATROUGH, TOBRAPEAK, TOBRARND, AMIKACINPEAK, AMIKACINTROU, AMIKACIN,  in the last 72 hours   Microbiology: No results found for this or any previous visit (from the past 720 hour(s)).  Medical History: Past Medical History  Diagnosis Date  . DIABETES MELLITUS, UNCONTROLLED 05/21/2009  . HYPERLIPIDEMIA 03/30/2007  . ANEMIA-IRON DEFICIENCY 01/25/2008  . HYPERTENSION 01/25/2008  . SINUSITIS- ACUTE-NOS 01/25/2008  . SECONDARY HYPERPARATHYROIDISM 05/21/2009  . RENAL INSUFFICIENCY 02/01/2008  . CERVICAL RADICULOPATHY, LEFT 01/25/2008  . BACK PAIN 05/02/2009  . NUMBNESS 05/21/2009  . Allergic rhinitis, cause unspecified 12/08/2010  . Foot ulcer     right lateral malleolus  . Critical lower limb ischemia     Medications:  Prescriptions prior to admission  Medication Sig Dispense Refill  . amLODipine (NORVASC) 2.5 MG tablet Take 2.5 mg by mouth 2 (two) times daily.      [name] aspirin 81 MG EC tablet Take 81 mg by mouth daily.        . calcitRIOL (ROCALTROL) 0.5 MCG capsule Take 0.5 mcg by mouth daily.       . ferrous sulfate (FERROUSUL) 325 (65 FE) MG tablet Take 325 mg by mouth daily with breakfast.      . furosemide (LASIX) 40 MG tablet Take 40 mg by  mouth 2 (two) times daily.      . Multiple Vitamin (MULTIVITAMIN WITH MINERALS) TABS tablet Take 1 tablet by mouth daily.      [name] oxyCODONE-acetaminophen (PERCOCET) 10-325 MG per tablet Take 1 tablet by mouth every 4 (four) hours as needed for pain.      . simvastatin (ZOCOR) 20 MG tablet Take 1 tablet (20 mg total) by mouth at bedtime.  90 tablet  3  . sitaGLIPtin (JANUVIA) 100 MG tablet Take 100 mg by mouth daily.      . insulin glargine (LANTUS) 100 unit/mL SOPN Inject 55 Units into the skin at bedtime.       Assessment: 59 yoF to start on vanc/zosyn for gangrene with evidence of leukocytosis and mild sepsis.  Patient is S/P right BKA.  Per ortho amputation clean.  Patient with noted ESRD not on HD.  Scr 6.42. WBC elevated and Tmax 99.2.    Goal of Therapy:  Vancomycin trough level 15-20 mcg/ml  Plan:  Start Vanc 750mg  Q48H Start Zosyn 2.25gm Q8H Discontinue other antibiotics Follow renal function, cultures, and clinical progress Trough level when appropriate  Thank you, Vivia Ewing, PharmD Clinical Pharmacist - Resident Pager: [phone] Pharmacy: [phone] 03/15/2014 5:21 PM

## 2014-03-16 ENCOUNTER — Encounter (HOSPITAL_COMMUNITY): Payer: Self-pay | Admitting: Orthopedic Surgery

## 2014-03-16 LAB — CBC
HCT: 27.1 % — ABNORMAL LOW (ref 36.0–46.0)
Hemoglobin: 9.1 g/dL — ABNORMAL LOW (ref 12.0–15.0)
MCH: 28.3 pg (ref 26.0–34.0)
MCHC: 33.6 g/dL (ref 30.0–36.0)
MCV: 84.4 fL (ref 78.0–100.0)
Platelets: 455 10*3/uL — ABNORMAL HIGH (ref 150–400)
RBC: 3.21 MIL/uL — ABNORMAL LOW (ref 3.87–5.11)
RDW: 15.9 % — ABNORMAL HIGH (ref 11.5–15.5)
WBC: 20.2 10*3/uL — ABNORMAL HIGH (ref 4.0–10.5)

## 2014-03-16 LAB — GLUCOSE, CAPILLARY
Glucose-Capillary: 208 mg/dL — ABNORMAL HIGH (ref 70–99)
Glucose-Capillary: 248 mg/dL — ABNORMAL HIGH (ref 70–99)
Glucose-Capillary: 189 mg/dL — ABNORMAL HIGH (ref 70–99)
Glucose-Capillary: 231 mg/dL — ABNORMAL HIGH (ref 70–99)

## 2014-03-16 LAB — URINALYSIS, ROUTINE W REFLEX MICROSCOPIC
Bilirubin Urine: NEGATIVE
Glucose, UA: 500 mg/dL — AB
Ketones, ur: NEGATIVE mg/dL
Leukocytes, UA: NEGATIVE
Nitrite: NEGATIVE
Protein, ur: 100 mg/dL — AB
Specific Gravity, Urine: 1.015 (ref 1.005–1.030)
Urobilinogen, UA: 0.2 mg/dL (ref 0.0–1.0)
pH: 5.5 (ref 5.0–8.0)

## 2014-03-16 LAB — HEMOGLOBIN A1C
Hgb A1c MFr Bld: 8.2 % — ABNORMAL HIGH (ref <5.7)
Mean Plasma Glucose: 189 mg/dL — ABNORMAL HIGH (ref <117)

## 2014-03-16 LAB — BASIC METABOLIC PANEL
Anion gap: 18 — ABNORMAL HIGH (ref 5–15)
BUN: 56 mg/dL — ABNORMAL HIGH (ref 6–23)
CO2: 18 mEq/L — ABNORMAL LOW (ref 19–32)
Calcium: 8.6 mg/dL (ref 8.4–10.5)
Chloride: 89 mEq/L — ABNORMAL LOW (ref 96–112)
Creatinine, Ser: 5.42 mg/dL — ABNORMAL HIGH (ref 0.50–1.10)
GFR calc Af Amer: 9 mL/min — ABNORMAL LOW (ref >90)
GFR calc non Af Amer: 8 mL/min — ABNORMAL LOW (ref >90)
Glucose, Bld: 207 mg/dL — ABNORMAL HIGH (ref 70–99)
Potassium: 4.4 mEq/L (ref 3.7–5.3)
Sodium: 125 mEq/L — ABNORMAL LOW (ref 137–147)

## 2014-03-16 LAB — CREATININE, URINE, RANDOM: Creatinine, Urine: 38.24 mg/dL

## 2014-03-16 LAB — SODIUM, URINE, RANDOM: Sodium, Ur: 51 mEq/L

## 2014-03-16 LAB — OSMOLALITY: Osmolality: 289 mOsm/kg (ref 275–300)

## 2014-03-16 LAB — URINE MICROSCOPIC-ADD ON

## 2014-03-16 LAB — PREPARE RBC (CROSSMATCH)

## 2014-03-16 MED ORDER — FUROSEMIDE 10 MG/ML IJ SOLN
40.0000 mg | Freq: Once | INTRAMUSCULAR | Status: AC
  Administered 2014-03-16: 40 mg via INTRAVENOUS
  Filled 2014-03-16: qty 4

## 2014-03-16 MED ORDER — DIPHENHYDRAMINE HCL 50 MG/ML IJ SOLN: 25.0000 mg | Freq: Four times a day (QID) | INTRAMUSCULAR | Status: DC | PRN

## 2014-03-16 MED ORDER — FUROSEMIDE 10 MG/ML IJ SOLN: 20.0000 mg | INTRAMUSCULAR | Status: DC

## 2014-03-16 MED ORDER — BACLOFEN 5 MG HALF TABLET
5.0000 mg | ORAL_TABLET | Freq: Three times a day (TID) | ORAL | Status: DC | PRN
  Administered 2014-03-16 – 2014-03-19 (×3): 5 mg via ORAL
  Filled 2014-03-16 (×4): qty 1

## 2014-03-16 MED ORDER — FUROSEMIDE 40 MG PO TABS
40.0000 mg | ORAL_TABLET | Freq: Two times a day (BID) | ORAL | Status: DC
  Administered 2014-03-17 – 2014-03-20 (×7): 40 mg via ORAL
  Filled 2014-03-16 (×9): qty 1

## 2014-03-16 MED ORDER — INSULIN GLARGINE 100 UNIT/ML ~~LOC~~ SOLN
60.0000 [IU] | Freq: Every day | SUBCUTANEOUS | Status: DC
Start: 2014-03-16 — End: 2014-03-17
  Administered 2014-03-16: 60 [IU] via SUBCUTANEOUS
  Filled 2014-03-16 (×2): qty 0.6

## 2014-03-16 NOTE — Progress Notes (Signed)
Rehab Admissions Coordinator Note:  Patient was screened by Omauri Boeve L for appropriateness for an Inpatient Acute Rehab Consult.  At this time, we are recommending Inpatient Rehab consult.  Terin Dierolf, PT Rehabilitation Admissions Coordinator [phone]  

## 2014-03-16 NOTE — Evaluation (Signed)
Occupational Therapy Evaluation Patient Details Name: BERENISSE ROBOTHAM MRN: [id number] DOB: Apr 04, 1951 Today's Date: 03/16/2014    History of Present Illness Right below-knee amputation due to gangrene. PMHx:DM, HTN, renal disease, LUE with fistula, back pain   Clinical Impression   This 63 yo female admitted and underwent above presents to acute OT with increased pain, decreased balance, and decreased mobility all affecting pt's ability to care for herself. She will benefit from acute OT with follow up on inpatient rehab to get to a Mod I level to go home with her sister.    Follow Up Recommendations  CIR    Equipment Recommendations   (TBD at next venue)       Precautions / Restrictions Precautions Precautions: Fall Required Braces or Orthoses: Knee Immobilizer - Right Knee Immobilizer - Right: On at all times (to help with positioning of new BKA) Restrictions Weight Bearing Restrictions: Yes RLE Weight Bearing: Non weight bearing      Mobility Bed Mobility Overal bed mobility: Needs Assistance Bed Mobility: Supine to Sit     Supine to sit: Mod assist;HOB elevated     General bed mobility comments: to scoot to EOB and come up to sit; after up to sit patient could scoot forward with minguard A to get her LLE on floor  Transfers Overall transfer level: Needs assistance   Transfers: Squat Pivot Transfers     Squat pivot transfers: Mod assist          Balance Overall balance assessment: Needs assistance Sitting-balance support: Bilateral upper extremity supported (foot supported) Sitting balance-Leahy Scale: Poor     Standing balance support:  (partial standing balance) Standing balance-Leahy Scale: Poor                              ADL Overall ADL's : Needs assistance/impaired Eating/Feeding: Set up;Sitting   Grooming: Set up;Sitting   Upper Body Bathing: Set up;Sitting   Lower Body Bathing: Maximal assistance;Sitting/lateral leans    Upper Body Dressing : Set up;Sitting   Lower Body Dressing: Maximal assistance;Sitting/lateral leans   Toilet Transfer: Moderate assistance;Squat-pivot (Bed>recliner going to her left (non BKA side))   Toileting- Clothing Manipulation and Hygiene: Total assistance;Sit to/from stand (sit to partial stand)                         Pertinent Vitals/Pain 10/10 in RLE post moving from bed to recliner; pt reports them as spasms; RN made aware--already pre-medicated, but RN will call to see if pt can get a muscle relaxer ordered.     Hand Dominance Right   Extremity/Trunk Assessment Upper Extremity Assessment Upper Extremity Assessment: Overall WFL for tasks assessed   Lower Extremity Assessment Lower Extremity Assessment: Defer to PT evaluation       Communication Communication Communication: No difficulties   Cognition Arousal/Alertness: Awake/alert Behavior During Therapy: WFL for tasks assessed/performed Overall Cognitive Status: Within Functional Limits for tasks assessed                                Home Living Family/patient expects to be discharged to:: Inpatient rehab                                 Additional Comments: Was living at home pta by herself and plans  to go stay with her sister after a rehab stay      Prior Functioning/Environment Level of Independence: Independent             OT Diagnosis: Generalized weakness;Acute pain   OT Problem List: Decreased strength;Decreased range of motion;Impaired balance (sitting and/or standing);Pain;Decreased knowledge of use of DME or AE   OT Treatment/Interventions: Self-care/ADL training;Patient/family education;Balance training;DME and/or AE instruction    OT Goals(Current goals can be found in the care plan section) Acute Rehab OT Goals Patient Stated Goal: to go to rehab then home with sister OT Goal Formulation: With patient Time For Goal Achievement: 03/23/14 Potential  to Achieve Goals: Good  OT Frequency: Min 2X/week              End of Session Equipment Utilized During Treatment: Gait belt Nurse Communication:  (and NT: Mod A squat pivot to non BKA side)  Activity Tolerance: Patient limited by pain Patient left: in chair;with call bell/phone within reach   Time: 1113-1139 OT Time Calculation (min): 26 min Charges:  OT General Charges $OT Visit: 1 Procedure OT Evaluation $Initial OT Evaluation Tier I: 1 Procedure OT Treatments $Self Care/Home Management : 8-22 mins  Almon Register [id number] 03/16/2014, 12:24 PM

## 2014-03-16 NOTE — Progress Notes (Signed)
Utilization review completed.  

## 2014-03-16 NOTE — Progress Notes (Signed)
CRITICAL VALUE ALERT  Critical value received:  Hemoglobin 5.8  Date of notification:  03/15/14  Time of notification:  2035  Critical value read back:Yes.    Nurse who received alert:  Judieth Keens RN  MD notified (1st page):  Dr. Doree Barthel  Time of first page:  2035  MD notified (2nd page):  Time of second page:  Responding MD:  Judieth Keens RN  Time MD responded:  2036

## 2014-03-16 NOTE — Progress Notes (Signed)
Consult Note                                            Patient Demographics  Carolyn Valenzuela, is a 63 y.o. female, DOB - [birth year], [id number]  Admit date - 03/15/2014   Admitting Physician [name], MD  Outpatient Primary MD for the patient is Cathlean Cower, MD  LOS - 1   No chief complaint on file.      Subjective:   Carolyn Valenzuela today has, No headache, No chest pain, No abdominal pain - No Nausea, No new weakness tingling or numbness, No Cough - SOB. Mild right BKA stump pain.    Assessment & Plan    1. sepsis with Right foot wet diabetic gangrene -  status post BKA by orthopedics on 03/15/2014, orthopedics are the primary team, blood cultures pending, continue empiric vancomycin and Zosyn as she still has significant leukocytosis. Chest x-ray stable. Will check UA, We'll continue to monitor.    2. DM 2. Noted A1c, increased Lantus , continue sliding scale & oral trajenta.   Lab Results  Component Value Date   HGBA1C 8.2* 03/15/2014    CBG (last 3)   Recent Labs  03/15/14 2125 03/16/14 0632 03/16/14 1052  GLUCAP 210* 208* 248*     3. Hypertension. On Norvasc along with as needed hydralazine.     4. ESRD. Currently not on dialysis, she has a nonfunctioning fistula in the left arm, follows with Dr. Moshe Cipro, will monitor renal function and symptoms of uremia. Creatinine actually appears to have much improved.    5. Dyslipidemia. Continue home dose statin.    6. Acute operative blood loss related anemia in a patient with Chronic severe iron deficiency anemia. Requiring frequent outpatient iron transfusions, she status post 2 units of packed RBC transfusion on 03/15/2014 with stable H&H, continue to monitor.    7. Hyponatremia. Worse after IV fluids, will check serum osmolality, urine sodium and  osmolality, likely SIADH. Restrict fluid intake and monitor.      Code Status: Full  Family Communication: Sisters bedside    Medications  Scheduled Meds: . amLODipine  2.5 mg Oral BID  . aspirin EC  81 mg Oral Daily  . calcitRIOL  0.5 mcg Oral Daily  . docusate sodium  100 mg Oral BID  . ferrous sulfate  325 mg Oral Q breakfast  . [START ON 03/17/2014] furosemide  40 mg Oral BID  . insulin aspart  0-15 Units Subcutaneous TID WC  . insulin glargine  55 Units Subcutaneous QHS  . linagliptin  5 mg Oral Daily  . multivitamin with minerals  1 tablet Oral Daily  . piperacillin-tazobactam (ZOSYN)  IV  2.25 g Intravenous 3 times per day  . senna  2 tablet Oral BID  . simvastatin  20 mg Oral QHS  . vancomycin  750 mg Intravenous Q48H   Continuous Infusions:  PRN Meds:.diphenhydrAMINE, hydrALAZINE, HYDROcodone-acetaminophen, morphine injection, ondansetron (ZOFRAN) IV   DVT Prophylaxis per primary team can consider heparin 5000 subcutaneous 3 times a day    Lab Results  Component Value Date   PLT 455* 03/16/2014    Antibiotics     Anti-infectives   Start     Dose/Rate Route Frequency Ordered Stop   03/15/14 2100  ceFAZolin (ANCEF) IVPB 1 g/50 mL premix  Status:  Discontinued     1 g 100 mL/hr over 30 Minutes Intravenous Every 6 hours 03/15/14 1659 03/15/14 1724   03/15/14 1730  vancomycin (VANCOCIN) IVPB 750 mg/150 ml premix     750 mg 150 mL/hr over 60 Minutes Intravenous Every 48 hours 03/15/14 1724     03/15/14 1730  piperacillin-tazobactam (ZOSYN) IVPB 2.25 g     2.25 g 100 mL/hr over 30 Minutes Intravenous 3 times per day 03/15/14 1724     03/15/14 0600  ceFAZolin (ANCEF) IVPB 2 g/50 mL premix     2 g 100 mL/hr over 30 Minutes Intravenous On call to O.R. 03/14/14 1432 03/15/14 1454          Objective:   Filed Vitals:   03/16/14 0030 03/16/14 0135 03/16/14 0355 03/16/14 0635  BP: 181/76 181/72 203/84 173/72  Pulse: 88 88 88 93  Temp: 98.7 F (37.1 C)  98.9 F (37.2 C) 98.8 F (37.1 C) 98.6 F (37 C)  TempSrc: Oral Oral Oral Oral  Resp: 16 16 16 18   SpO2: 100% 100% 100% 98%    Wt Readings from Last 3 Encounters:  03/09/14 56.246 kg (124 lb)  03/08/14 58.06 kg (128 lb)  01/09/14 56.518 kg (124 lb 9.6 oz)     Intake/Output Summary (Last 24 hours) at 03/16/14 1102 Last data filed at 03/16/14 1057  Gross per 24 hour  Intake   1923 ml  Output    700 ml  Net   1223 ml     Physical Exam  Awake Alert, Oriented X 3, No new F.N deficits, Normal affect [place].AT,PERRAL Supple Neck,No JVD, No cervical lymphadenopathy appriciated.  Symmetrical Chest wall movement, Good air movement bilaterally, CTAB RRR,No Gallops,Rubs or new Murmurs, No Parasternal Heave +ve B.Sounds, Abd Soft, No tenderness, No organomegaly appriciated, No rebound - guarding or rigidity. No Cyanosis, Clubbing or edema, No new Rash or bruise, right BKA stump under bandage and splint.   Data Review   Micro Results No results found for this or any previous visit (from the past 240 hour(s)).  Radiology Reports Dg Chest 2 View  03/15/2014   CLINICAL DATA:  Preop prior to foot surgery; history of diabetes and hypertension  EXAM: CHEST  2 VIEW  COMPARISON:  PA and lateral chest x-ray dated March 26, 2010  FINDINGS: The lungs are adequately inflated and clear. The heart is top-normal in size. The pulmonary vascularity is normal. There is no pleural effusion. The bony thorax is unremarkable.  IMPRESSION: There is no active cardiopulmonary disease.   Electronically Signed   By: David  Martinique   On: 03/15/2014 12:37    CBC  Recent Labs Lab 03/15/14 1200 03/15/14 1850 03/15/14 2035 03/16/14 1030  WBC 21.3*  --   --  20.2*  HGB 7.4* 4.0* 5.8* 9.1*  HCT 23.4* 12.0* 17.8* 27.1*  PLT 709*  --   --  455*  MCV 89.3  --   --  84.4  MCH 28.2  --   --  28.3  MCHC 31.6  --   --  33.6  RDW 16.2*  --   --  15.9*    Chemistries   Recent Labs Lab 03/15/14 1200  03/16/14 0645  NA 132* 125*  K 4.4 4.4  CL 91* 89*  CO2 21 18*  GLUCOSE 295* 207*  BUN 65* 56*  CREATININE 6.42* 5.42*  CALCIUM 10.3 8.6   ------------------------------------------------------------------------------------------------------------------ CrCl is unknown because both a height and weight (above a  minimum accepted value) are required for this calculation. ------------------------------------------------------------------------------------------------------------------  Recent Labs  03/15/14 1850  HGBA1C 8.2*   ------------------------------------------------------------------------------------------------------------------ No results found for this basename: CHOL, HDL, LDLCALC, TRIG, CHOLHDL, LDLDIRECT,  in the last 72 hours ------------------------------------------------------------------------------------------------------------------ No results found for this basename: TSH, T4TOTAL, FREET3, T3FREE, THYROIDAB,  in the last 72 hours ------------------------------------------------------------------------------------------------------------------ No results found for this basename: VITAMINB12, FOLATE, FERRITIN, TIBC, IRON, RETICCTPCT,  in the last 72 hours  Coagulation profile No results found for this basename: INR, PROTIME,  in the last 168 hours  No results found for this basename: DDIMER,  in the last 72 hours  Cardiac Enzymes No results found for this basename: CK, CKMB, TROPONINI, MYOGLOBIN,  in the last 168 hours ------------------------------------------------------------------------------------------------------------------ No components found with this basename: POCBNP,      Time Spent in minutes  35   SINGH,PRASHANT K M.D on 03/16/2014 at 11:02 AM  Between 7am to 7pm - Pager - [phone]  After 7pm go to [email or web address] - password [email or web address]  And look for the night coverage person covering for me after hours  [facility] Office   [phone]   **Disclaimer: This note may have been dictated with voice recognition software. Similar sounding words can inadvertently be transcribed and this note may contain transcription errors which may not have been corrected upon publication of note.**

## 2014-03-16 NOTE — Op Note (Signed)
Carolyn Valenzuela, HAAN NO.:  [id number]  MEDICAL RECORD NO.:  [id number]  LOCATION:  [id number]                        FACILITY:  [facility]  PHYSICIAN:  [name], MD        DATE OF BIRTH:  [birth year]  DATE OF PROCEDURE:  03/15/2014 DATE OF DISCHARGE:                              OPERATIVE REPORT   PREOPERATIVE DIAGNOSIS:  Right foot gangrene.  POSTOPERATIVE DIAGNOSIS:  Right foot gangrene.  PROCEDURE:  Right below-knee amputation.  SURGEON:  [name], MD  ANESTHESIA:  General.  ESTIMATED BLOOD LOSS:  200 mL.  TOURNIQUET TIME:  9 minutes at 300 mmHg.  COMPLICATIONS:  None apparent.  DISPOSITION:  Extubated, awake, and stable to recovery.  INDICATIONS FOR PROCEDURE:  The patient is a 63 year old woman with past medical history significant for diabetes and peripheral vascular disease.  She presented to my office last week with dry gangrene of her right foot.  She presents today for right below-knee amputation.  She understands the risks, benefits, the alternative treatment options, and elects surgical treatment.  She specifically understands risks of bleeding, infection, nerve damage, blood clots, need for additional surgery, continued pain, amputation, and death.  PROCEDURE IN DETAIL:  After preoperative consent was obtained and the correct operative site was identified, the patient was brought to the operating room and placed supine on the operating table.  General anesthesia was induced.  Preoperative antibiotics were administered. Surgical time-out was taken.  The right lower extremity was prepped and draped in standard sterile fashion and the tourniquet around the thigh. The extremity was exsanguinated.  The tourniquet was inflated to 300 mmHg.  A fishmouth incision was marked on the skin 15 cm from the medial joint line.  The incision was made, sharp dissection was carried down through the skin and subcutaneous tissue circumferentially.   The periosteum was incised to the tibia and elevated proximally.  A reciprocating saw was used to cut through the tibia.  The fibula was dissected and cut with the reciprocating saw approximately 2 cm proximal from the tibial cut.  The distal leg was retracted anteriorly and an amputation knife was used to cut through the posterior soft tissues contouring the posterior flap appropriately.  The leg was then passed off the field as a specimen to Pathology.  The named arteries were ligated with either 2-0 silk ties or vascular clips.  All the named nerves were cut on gentle traction with #15 blade.  Wound was then irrigated copiously after bevelling the tibial cut appropriately with a rasp.  The gastrocnemius tendon was then repaired to the anterior tibial periosteum with #1 PDS simple sutures.  The posterior fascia was repaired to the anterior fascia again using #1 PDS sutures.  Superficial subcutaneous tissues were approximated with inverted simple sutures of 3- 0 Monocryl and horizontal mattress sutures of 3-0 nylon.  The 2-0 nylon were used to close the skin incision.  The tourniquet had been released at 9 minutes and hemostasis was achieved prior to closure.  Sterile dressings were applied followed by a compression wrap and short knee immobilizer.  The patient was then awakened from anesthesia and transported to the recovery room in stable  condition.  FOLLOWUP PLAN:  The patient will be admitted to the inpatient ward and will have evaluations by physical therapy, occupational therapy and case management.  Hospitalist consultation will be obtained for assistance and care for her multiple medical problems.     [name], MD     JH/MEDQ  D:  03/15/2014  T:  03/16/2014  Job:  [id number]

## 2014-03-16 NOTE — Progress Notes (Signed)
Subjective: 1 Day Post-Op Procedure(s) (LRB): RIGHT  LEG  BELOW KNEE AMPUTATION  (Right) Patient reports pain as mild.  Pain controlled with oral pain meds.  Pt c/o itching with the pain meds.  Both sisters at bedside.  Has been able to urinate on the bedpan.  Transfused 2 u pRBCs last night for hgb 5.8.  Labs pending for this morning.  Says her leg feels heavy - no real phantom sensation / pain.  Objective: Vital signs in last 24 hours: Temp:  [97.7 F (36.5 C)-100 F (37.8 C)] 98.6 F (37 C) (07/17 0635) Pulse Rate:  [73-93] 93 (07/17 0635) Resp:  [13-19] 18 (07/17 0635) BP: (114-206)/(54-84) 173/72 mmHg (07/17 0635) SpO2:  [91 %-100 %] 98 % (07/17 0635)  Intake/Output from previous day: 07/16 0701 - 07/17 0700 In: 1923 [I.V.:1100; Blood:623; IV Piggyback:200] Out: 200 [Blood:200] Intake/Output this shift:     Recent Labs  03/15/14 1200 03/15/14 1850 03/15/14 2035  HGB 7.4* 4.0* 5.8*    Recent Labs  03/15/14 1200 03/15/14 1850 03/15/14 2035  WBC 21.3*  --   --   RBC 2.62*  --   --   HCT 23.4* 12.0* 17.8*  PLT 709*  --   --     Recent Labs  03/15/14 1200  NA 132*  K 4.4  CL 91*  CO2 21  BUN 65*  CREATININE 6.42*  GLUCOSE 295*  CALCIUM 10.3   No results found for this basename: LABPT, INR,  in the last 72 hours  PE: thin woman in nad.  a and O.  Mood and affect at baseline.  R LE dressed and dry.  Knee immob loosened.  Assessment/Plan: 1 Day Post-Op Procedure(s) (LRB): RIGHT  LEG  BELOW KNEE AMPUTATION  (Right) PT, OT, SW consults.  I anticipate pt will need acute rehab and possibly SNF.  I'll order CIR consult. She will likely need more blood based on AM labs.  Appreciate Dr. Keturah Barre management of this medically complex patient.  [name] 03/16/2014, 7:41 AM

## 2014-03-16 NOTE — Evaluation (Signed)
Physical Therapy Evaluation Patient Details Name: Carolyn Valenzuela MRN: [id number] DOB: 27-Sep-1950 Today's Date: 03/16/2014   History of Present Illness  63 y.o. 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 PMHx:DM, HTN, renal disease, LUE with fistula, back pain  Clinical Impression  Patient is seen following the above procedure and presents with functional limitations due to the deficits listed below (see PT Problem List). Able to tolerate transfer training, therapeutic exercises, and took a step forwards and backwards with mod assist this afternoon. She is highly motivated to improve and plans to stay with her sister after further rehabilitation, when she becomes more independent. Patient will benefit from skilled PT to increase their independence and safety with mobility to allow discharge to the venue listed below.       Follow Up Recommendations CIR    Equipment Recommendations  Rolling walker with 5" wheels;3in1 (PT);Wheelchair (measurements PT);Wheelchair cushion (measurements PT)    Recommendations for Other Services Rehab consult     Precautions / Restrictions Precautions Precautions: Fall Required Braces or Orthoses: Knee Immobilizer - Right Knee Immobilizer - Right: On at all times Restrictions Weight Bearing Restrictions: Yes RLE Weight Bearing: Non weight bearing      Mobility  Bed Mobility Overal bed mobility: Needs Assistance Bed Mobility: Sit to Supine     Supine to sit: Mod assist;HOB elevated Sit to supine: Min guard   General bed mobility comments: Min guard with sit>supine into bed. VC for technique and requires extra time but no physical assist to enter bed.  Transfers Overall transfer level: Needs assistance Equipment used: Rolling walker (2 wheeled) Transfers: Sit to/from [facility] Sit to Stand: Min assist Stand pivot transfers: Mod assist Squat pivot transfers: Mod assist     General transfer comment: Min assist  for boost from recliner and onto bed with VC for hand placement. Mod assist with pivot for balance with posterior lean and and to control RW. Educated on safe DME use with rolling walker.  Ambulation/Gait Ambulation/Gait assistance: Mod assist Ambulation Distance (Feet): 2 Feet Assistive device: Rolling walker (2 wheeled) Gait Pattern/deviations:  ("hop to" technique)   Gait velocity interpretation: Below normal speed for age/gender General Gait Details: Able to take step forward and backwards with mod assist for balance and walker positioning.  VC to increase left foot clearance but only a modest improvement due to weakness.  Stairs            Wheelchair Mobility    Modified Rankin (Stroke Patients Only)       Balance Overall balance assessment: Needs assistance Sitting-balance support: No upper extremity supported;Feet supported Sitting balance-Leahy Scale: Fair     Standing balance support: Bilateral upper extremity supported Standing balance-Leahy Scale: Poor                               Pertinent Vitals/Pain 8/10 pain Nurse notified Patient repositioned in chair for comfort.     Home Living Family/patient expects to be discharged to:: Inpatient rehab Living Arrangements: Other relatives (sister) Available Help at Discharge: Family (sister) Type of Home: House Home Access: Stairs to enter Entrance Stairs-Rails: None Entrance Stairs-Number of Steps: 2 Home Layout: One level Home Equipment: Cane - single point Additional Comments: Was living at home pta by herself and plans to go stay with her sister after a rehab stay    Prior Function Level of Independence: Independent  Hand Dominance   Dominant Hand: Right    Extremity/Trunk Assessment   Upper Extremity Assessment: Defer to OT evaluation           Lower Extremity Assessment: RLE deficits/detail;LLE deficits/detail RLE Deficits / Details: Decreased strength and  ROM at knee as expected post op amputation LLE Deficits / Details: Reports intermittent numbness of left foot     Communication   Communication: No difficulties  Cognition Arousal/Alertness: Awake/alert Behavior During Therapy: WFL for tasks assessed/performed Overall Cognitive Status: Within Functional Limits for tasks assessed                      General Comments      Exercises General Exercises - Lower Extremity Ankle Circles/Pumps: AROM;Left;10 reps;Supine Amputee Exercises Quad Sets: AROM;Right;10 reps;Supine Hip Extension: AROM;Right;10 reps;Standing Hip Flexion/Marching: AROM;Right;5 reps;Standing Knee Flexion: AROM;Right;5 reps;Seated Knee Extension: AROM;Right;5 reps;Seated      Assessment/Plan    PT Assessment Patient needs continued PT services  PT Diagnosis Difficulty walking;Abnormality of gait;Acute pain   PT Problem List Decreased strength;Decreased range of motion;Decreased activity tolerance;Decreased balance;Decreased mobility;Decreased knowledge of use of DME;Decreased knowledge of precautions;Impaired sensation;Pain  PT Treatment Interventions DME instruction;Gait training;Stair training;Functional mobility training;Therapeutic activities;Therapeutic exercise;Balance training;Neuromuscular re-education;Patient/family education;Wheelchair mobility training;Modalities   PT Goals (Current goals can be found in the Care Plan section) Acute Rehab PT Goals Patient Stated Goal: to go to rehab then home with sister PT Goal Formulation: With patient Time For Goal Achievement: 03/23/14 Potential to Achieve Goals: Good    Frequency Min 5X/week   Barriers to discharge        Co-evaluation               End of Session Equipment Utilized During Treatment: Gait belt;Right knee immobilizer Activity Tolerance: Patient tolerated treatment well Patient left: in bed;with call bell/phone within reach;with family/visitor present Nurse Communication:  Mobility status         Time: [id number] PT Time Calculation (min): 37 min   Charges:   PT Evaluation $Initial PT Evaluation Tier I: 1 Procedure PT Treatments $Therapeutic Exercise: 8-22 mins $Therapeutic Activity: 8-22 mins   PT G Codes:         Elayne Snare, [facility]  Ellouise Newer 03/16/2014, 3:10 PM

## 2014-03-16 NOTE — Progress Notes (Signed)
Repeat hemoglobin received 03/15/14 at 2035.  Dr. Storm Frisk, on-call for [facility] contacted.  Orders to transfuse 2 units of blood.  Patient to remain on unit.  VSS.  Will continue to monitor patient closely.

## 2014-03-17 ENCOUNTER — Inpatient Hospital Stay (HOSPITAL_COMMUNITY): Payer: BC Managed Care – PPO

## 2014-03-17 LAB — URINE CULTURE
Colony Count: NO GROWTH
Culture: NO GROWTH

## 2014-03-17 LAB — OSMOLALITY, URINE: Osmolality, Ur: 291 mOsm/kg — ABNORMAL LOW (ref 390–1090)

## 2014-03-17 LAB — BASIC METABOLIC PANEL
Anion gap: 18 — ABNORMAL HIGH (ref 5–15)
BUN: 54 mg/dL — ABNORMAL HIGH (ref 6–23)
CO2: 19 mEq/L (ref 19–32)
Calcium: 9.2 mg/dL (ref 8.4–10.5)
Chloride: 90 mEq/L — ABNORMAL LOW (ref 96–112)
Creatinine, Ser: 5.06 mg/dL — ABNORMAL HIGH (ref 0.50–1.10)
GFR calc Af Amer: 10 mL/min — ABNORMAL LOW (ref >90)
GFR calc non Af Amer: 8 mL/min — ABNORMAL LOW (ref >90)
Glucose, Bld: 97 mg/dL (ref 70–99)
Potassium: 4.4 mEq/L (ref 3.7–5.3)
Sodium: 127 mEq/L — ABNORMAL LOW (ref 137–147)

## 2014-03-17 LAB — GLUCOSE, CAPILLARY
Glucose-Capillary: 54 mg/dL — ABNORMAL LOW (ref 70–99)
Glucose-Capillary: 135 mg/dL — ABNORMAL HIGH (ref 70–99)
Glucose-Capillary: 159 mg/dL — ABNORMAL HIGH (ref 70–99)
Glucose-Capillary: 188 mg/dL — ABNORMAL HIGH (ref 70–99)
Glucose-Capillary: 158 mg/dL — ABNORMAL HIGH (ref 70–99)

## 2014-03-17 LAB — CBC
HCT: 25.9 % — ABNORMAL LOW (ref 36.0–46.0)
Hemoglobin: 8.6 g/dL — ABNORMAL LOW (ref 12.0–15.0)
MCH: 28.6 pg (ref 26.0–34.0)
MCHC: 33.2 g/dL (ref 30.0–36.0)
MCV: 86 fL (ref 78.0–100.0)
Platelets: 455 10*3/uL — ABNORMAL HIGH (ref 150–400)
RBC: 3.01 MIL/uL — ABNORMAL LOW (ref 3.87–5.11)
RDW: 16.2 % — ABNORMAL HIGH (ref 11.5–15.5)
WBC: 18.4 10*3/uL — ABNORMAL HIGH (ref 4.0–10.5)

## 2014-03-17 MED ORDER — DOCUSATE SODIUM 100 MG PO CAPS
200.0000 mg | ORAL_CAPSULE | Freq: Two times a day (BID) | ORAL | Status: DC
  Administered 2014-03-17: 200 mg via ORAL
  Filled 2014-03-17 (×4): qty 2

## 2014-03-17 MED ORDER — HYDROCODONE-ACETAMINOPHEN 5-325 MG PO TABS
1.0000 | ORAL_TABLET | Freq: Four times a day (QID) | ORAL | Status: DC | PRN
  Administered 2014-03-17: 2 via ORAL
  Filled 2014-03-17: qty 2

## 2014-03-17 MED ORDER — DEXTROSE 50 % IV SOLN
25.0000 mL | Freq: Once | INTRAVENOUS | Status: AC | PRN
  Administered 2014-03-17: 25 mL via INTRAVENOUS

## 2014-03-17 MED ORDER — DEXTROSE 50 % IV SOLN
INTRAVENOUS | Status: AC
  Administered 2014-03-17: 25 mL via INTRAVENOUS
  Filled 2014-03-17: qty 50

## 2014-03-17 MED ORDER — POLYETHYLENE GLYCOL 3350 17 G PO PACK
17.0000 g | PACK | Freq: Two times a day (BID) | ORAL | Status: DC
  Administered 2014-03-17: 17 g via ORAL
  Filled 2014-03-17 (×7): qty 1

## 2014-03-17 MED ORDER — HYDRALAZINE HCL 10 MG PO TABS
10.0000 mg | ORAL_TABLET | Freq: Four times a day (QID) | ORAL | Status: DC | PRN
  Administered 2014-03-17 – 2014-03-20 (×3): 10 mg via ORAL
  Filled 2014-03-17 (×4): qty 1

## 2014-03-17 MED ORDER — INSULIN GLARGINE 100 UNIT/ML ~~LOC~~ SOLN
50.0000 [IU] | Freq: Every day | SUBCUTANEOUS | Status: DC
  Filled 2014-03-17: qty 0.5

## 2014-03-17 NOTE — Progress Notes (Signed)
Hypoglycemic Event  CBG58: 58   Treatment: 15 GM carbohydrate snack  Symptoms: Shaky  Follow-up CBG: Time:2330 CBG Result:146   Possible Reasons for Event: Inadequate meal intake   Comments/MD notified:    Carolyn Valenzuela A  Remember to initiate Hypoglycemia Order Set & complete

## 2014-03-17 NOTE — Progress Notes (Signed)
PT Cancellation Note  Patient Details Name: Carolyn Valenzuela MRN: [id number] DOB: [birth year]   Cancelled Treatment:    Reason Eval/Treat Not Completed: Patient declined, no reason specified. Patient eating lunch on first attempt of seeing patient and when attempting to see patient a second time, sister stating that she was going to give her a bath. Sister stated that patient had gotten from the chair to the bsc to recliner with assistance. Will follow up as able   Robinette, Tonia Brooms 03/17/2014, 1:35 PM

## 2014-03-17 NOTE — Progress Notes (Signed)
Subjective: 2 Days Post-Op Procedure(s) (LRB): RIGHT  LEG  BELOW KNEE AMPUTATION  (Right) Patient reports pain as mild.  OOB with PT yesterday.  CIR consult recommended.  Objective: Vital signs in last 24 hours: Temp:  [98.6 F (37 C)-99.8 F (37.7 C)] 99.8 F (37.7 C) (07/18 0619) Pulse Rate:  [86-88] 86 (07/18 0619) Resp:  [16-18] 18 (07/18 0619) BP: (177-190)/(59-74) 179/66 mmHg (07/18 0619) SpO2:  [94 %-100 %] 100 % (07/18 0619)  Intake/Output from previous day: 07/17 0701 - 07/18 0700 In: 960 [P.O.:960] Out: 1000 [Urine:1000] Intake/Output this shift:     Recent Labs  03/15/14 1200 03/15/14 1850 03/15/14 2035 03/16/14 1030 03/17/14 0410  HGB 7.4* 4.0* 5.8* 9.1* 8.6*    Recent Labs  03/16/14 1030 03/17/14 0410  WBC 20.2* 18.4*  RBC 3.21* 3.01*  HCT 27.1* 25.9*  PLT 455* 455*    Recent Labs  03/16/14 0645 03/17/14 0410  NA 125* 127*  K 4.4 4.4  CL 89* 90*  CO2 18* 19  BUN 56* 54*  CREATININE 5.42* 5.06*  GLUCOSE 207* 97  CALCIUM 8.6 9.2   No results found for this basename: LABPT, INR,  in the last 72 hours  PE:  wn wd woman in nad.  R LE dressed and dry.    Assessment/Plan: 2 Days Post-Op Procedure(s) (LRB): RIGHT  LEG  BELOW KNEE AMPUTATION  (Right) CIR consult requested.  Continue NWB on R LE.  PT, OT.   Stop abx.  Appreciate Dr. Keturah Barre management of this complicated patient.  [name] 03/17/2014, 8:15 AM

## 2014-03-17 NOTE — Progress Notes (Signed)
Pt sat on side of bed waiting for tech to come and take her to the BR. Pt and family member in the room reported that pt slid off bed on to floor. Denies any pain. Denies hitting any part of her body. Family had been helping pt to BR during the day and pt stated she was just trying to get ready to move. Vital signs are stable except for elevated blood pressure at 199/72 which she has been having prior to her fall. Pt evaluated and no skin tears or bruising noted. She denies pain and states it was not "a hard bump". Hospitalist notified and family present in room.

## 2014-03-17 NOTE — Progress Notes (Signed)
Hypoglycemic Event  CBG: 54  Treatment: D50 IV 25 mL  Symptoms: None  Follow-up CBG: Time: [id number] CBG Result:135  Possible Reasons for Event: Increased in Lantus   Comments/MD notified: Sticky note left for physicain    Carolyn Valenzuela A  Remember to initiate Hypoglycemia Order Set & complete

## 2014-03-17 NOTE — Progress Notes (Signed)
Consult Note                                            Patient Demographics  Carolyn Valenzuela, is a 63 y.o. female, DOB - Mar 03, 1951, [id number]  Admit date - 03/15/2014   Admitting Physician [name], MD  Outpatient Primary MD for the patient is Cathlean Cower, MD  LOS - 2   No chief complaint on file.      Subjective:   Carolyn Valenzuela today has, No headache, No chest pain, No abdominal pain , No new weakness tingling or numbness, No Cough - SOB. Had nausea and vomiting this morning with low blood sugars. Now resolved.    Assessment & Plan    1. Sepsis due to Right foot wet diabetic gangrene -  status post BKA by orthopedics on 03/15/2014, orthopedics are the primary team, blood cultures pending, continue empiric vancomycin and Zosyn for 24 more hours and stop on 03/18/2014, she is afebrile and with improving leukocytosis. Chest x-ray stable. Will check UA, We'll continue to monitor.     2. DM 2. Noted A1c, sugars drop in the morning of 03/17/2014, reduced Lantus, discontinue oral Trajenta continue sliding scale.  Lab Results  Component Value Date   HGBA1C 8.2* 03/15/2014    CBG (last 3)   Recent Labs  03/17/14 0638 03/17/14 0755 03/17/14 0833  GLUCAP 54* 135* 159*      3. Hypertension. On Norvasc along with as needed hydralazine.     4. ESRD. Currently not on dialysis, she has a nonfunctioning fistula in the left arm, follows with Dr. Moshe Cipro, will monitor renal function and symptoms of uremia. Creatinine actually appears to have much improved.    5. Dyslipidemia. Continue home dose statin.     6. Acute operative blood loss related anemia in a patient with Chronic severe iron deficiency anemia. Requiring frequent outpatient iron transfusions, she status post 2 units of packed RBC transfusion on  03/15/2014 with stable H&H, continue to monitor.     7. Hyponatremia. Worse after IV fluids, however serum and urine osmolality almost same, urine sodium 51. Could be SIADH, will continue Lasix at home dose for one more day and monitor BMP.      Code Status: Full  Family Communication: Sisters bedside    Medications  Scheduled Meds: . amLODipine  2.5 mg Oral BID  . aspirin EC  81 mg Oral Daily  . calcitRIOL  0.5 mcg Oral Daily  . docusate sodium  200 mg Oral BID  . ferrous sulfate  325 mg Oral Q breakfast  . furosemide  40 mg Oral BID  . insulin aspart  0-15 Units Subcutaneous TID WC  . insulin glargine  50 Units Subcutaneous QHS  . multivitamin with minerals  1 tablet Oral Daily  . polyethylene glycol  17 g Oral BID  . senna  2 tablet Oral BID  . simvastatin  20 mg Oral QHS   Continuous Infusions:  PRN Meds:.baclofen, diphenhydrAMINE, hydrALAZINE, HYDROcodone-acetaminophen, morphine injection, ondansetron (ZOFRAN) IV   DVT Prophylaxis per primary team can consider heparin 5000 subcutaneous 3 times a day    Lab Results  Component Value Date   PLT 455* 03/17/2014    Antibiotics     Anti-infectives   Start     Dose/Rate Route Frequency Ordered Stop  03/15/14 2100  ceFAZolin (ANCEF) IVPB 1 g/50 mL premix  Status:  Discontinued     1 g 100 mL/hr over 30 Minutes Intravenous Every 6 hours 03/15/14 1659 03/15/14 1724   03/15/14 1730  vancomycin (VANCOCIN) IVPB 750 mg/150 ml premix  Status:  Discontinued     750 mg 150 mL/hr over 60 Minutes Intravenous Every 48 hours 03/15/14 1724 03/17/14 0815   03/15/14 1730  piperacillin-tazobactam (ZOSYN) IVPB 2.25 g  Status:  Discontinued     2.25 g 100 mL/hr over 30 Minutes Intravenous 3 times per day 03/15/14 1724 03/17/14 0815   03/15/14 0600  ceFAZolin (ANCEF) IVPB 2 g/50 mL premix     2 g 100 mL/hr over 30 Minutes Intravenous On call to O.R. 03/14/14 1432 03/15/14 1454          Objective:   Filed Vitals:    03/17/14 0941 03/17/14 0953 03/17/14 0955 03/17/14 1042  BP: 196/70 189/75 189/75 157/69  Pulse: 88     Temp: 98.6 F (37 C)     TempSrc: Oral     Resp: 16     SpO2: 92%       Wt Readings from Last 3 Encounters:  03/09/14 56.246 kg (124 lb)  03/08/14 58.06 kg (128 lb)  01/09/14 56.518 kg (124 lb 9.6 oz)     Intake/Output Summary (Last 24 hours) at 03/17/14 1049 Last data filed at 03/16/14 1700  Gross per 24 hour  Intake    720 ml  Output   1000 ml  Net   -280 ml     Physical Exam  Awake Alert, Oriented X 3, No new F.N deficits, Normal affect [place].AT,PERRAL Supple Neck,No JVD, No cervical lymphadenopathy appriciated.  Symmetrical Chest wall movement, Good air movement bilaterally, CTAB RRR,No Gallops,Rubs or new Murmurs, No Parasternal Heave +ve B.Sounds, Abd Soft, No tenderness, No organomegaly appriciated, No rebound - guarding or rigidity. No Cyanosis, Clubbing or edema, No new Rash or bruise, right BKA stump under bandage and splint.   Data Review   Micro Results No results found for this or any previous visit (from the past 240 hour(s)).  Radiology Reports Dg Chest 2 View  03/15/2014   CLINICAL DATA:  Preop prior to foot surgery; history of diabetes and hypertension  EXAM: CHEST  2 VIEW  COMPARISON:  PA and lateral chest x-ray dated March 26, 2010  FINDINGS: The lungs are adequately inflated and clear. The heart is top-normal in size. The pulmonary vascularity is normal. There is no pleural effusion. The bony thorax is unremarkable.  IMPRESSION: There is no active cardiopulmonary disease.   Electronically Signed   By: David  [place]ique   On: 03/15/2014 12:37    CBC  Recent Labs Lab 03/15/14 1200 03/15/14 1850 03/15/14 2035 03/16/14 1030 03/17/14 0410  WBC 21.3*  --   --  20.2* 18.4*  HGB 7.4* 4.0* 5.8* 9.1* 8.6*  HCT 23.4* 12.0* 17.8* 27.1* 25.9*  PLT 709*  --   --  455* 455*  MCV 89.3  --   --  84.4 86.0  MCH 28.2  --   --  28.3 28.6  MCHC 31.6  --   --   33.6 33.2  RDW 16.2*  --   --  15.9* 16.2*    Chemistries   Recent Labs Lab 03/15/14 1200 03/16/14 0645 03/17/14 0410  NA 132* 125* 127*  K 4.4 4.4 4.4  CL 91* 89* 90*  CO2 21 18* 19  GLUCOSE 295* 207*  97  BUN 65* 56* 54*  CREATININE 6.42* 5.42* 5.06*  CALCIUM 10.3 8.6 9.2   ------------------------------------------------------------------------------------------------------------------ CrCl is unknown because both a height and weight (above a minimum accepted value) are required for this calculation. ------------------------------------------------------------------------------------------------------------------  Recent Labs  03/15/14 1850  HGBA1C 8.2*   ------------------------------------------------------------------------------------------------------------------ No results found for this basename: CHOL, HDL, LDLCALC, TRIG, CHOLHDL, LDLDIRECT,  in the last 72 hours ------------------------------------------------------------------------------------------------------------------ No results found for this basename: TSH, T4TOTAL, FREET3, T3FREE, THYROIDAB,  in the last 72 hours ------------------------------------------------------------------------------------------------------------------ No results found for this basename: VITAMINB12, FOLATE, FERRITIN, TIBC, IRON, RETICCTPCT,  in the last 72 hours  Coagulation profile No results found for this basename: INR, PROTIME,  in the last 168 hours  No results found for this basename: DDIMER,  in the last 72 hours  Cardiac Enzymes No results found for this basename: CK, CKMB, TROPONINI, MYOGLOBIN,  in the last 168 hours ------------------------------------------------------------------------------------------------------------------ No components found with this basename: POCBNP,      Time Spent in minutes  35   Lala Lund K M.D on 03/17/2014 at 10:49 AM  Between 7am to 7pm - Pager - [phone]  After 7pm go  to [email or web address] - password [email or web address]  And look for the night coverage person covering for me after hours  [facility] Office  [phone]   **Disclaimer: This note may have been dictated with voice recognition software. Similar sounding words can inadvertently be transcribed and this note may contain transcription errors which may not have been corrected upon publication of note.**

## 2014-03-18 LAB — CBC
HCT: 25.8 % — ABNORMAL LOW (ref 36.0–46.0)
Hemoglobin: 8.6 g/dL — ABNORMAL LOW (ref 12.0–15.0)
MCH: 28.7 pg (ref 26.0–34.0)
MCHC: 33.3 g/dL (ref 30.0–36.0)
MCV: 86 fL (ref 78.0–100.0)
Platelets: 449 10*3/uL — ABNORMAL HIGH (ref 150–400)
RBC: 3 MIL/uL — ABNORMAL LOW (ref 3.87–5.11)
RDW: 16 % — ABNORMAL HIGH (ref 11.5–15.5)
WBC: 17.4 10*3/uL — ABNORMAL HIGH (ref 4.0–10.5)

## 2014-03-18 LAB — BASIC METABOLIC PANEL
Anion gap: 19 — ABNORMAL HIGH (ref 5–15)
BUN: 53 mg/dL — ABNORMAL HIGH (ref 6–23)
CO2: 20 mEq/L (ref 19–32)
Calcium: 9 mg/dL (ref 8.4–10.5)
Chloride: 92 mEq/L — ABNORMAL LOW (ref 96–112)
Creatinine, Ser: 5.2 mg/dL — ABNORMAL HIGH (ref 0.50–1.10)
GFR calc Af Amer: 9 mL/min — ABNORMAL LOW (ref >90)
GFR calc non Af Amer: 8 mL/min — ABNORMAL LOW (ref >90)
Glucose, Bld: 66 mg/dL — ABNORMAL LOW (ref 70–99)
Potassium: 3.9 mEq/L (ref 3.7–5.3)
Sodium: 131 mEq/L — ABNORMAL LOW (ref 137–147)

## 2014-03-18 LAB — GLUCOSE, CAPILLARY
Glucose-Capillary: 142 mg/dL — ABNORMAL HIGH (ref 70–99)
Glucose-Capillary: 146 mg/dL — ABNORMAL HIGH (ref 70–99)
Glucose-Capillary: 205 mg/dL — ABNORMAL HIGH (ref 70–99)
Glucose-Capillary: 217 mg/dL — ABNORMAL HIGH (ref 70–99)
Glucose-Capillary: 58 mg/dL — ABNORMAL LOW (ref 70–99)
Glucose-Capillary: 58 mg/dL — ABNORMAL LOW (ref 70–99)
Glucose-Capillary: 59 mg/dL — ABNORMAL LOW (ref 70–99)
Glucose-Capillary: 73 mg/dL (ref 70–99)
Glucose-Capillary: 76 mg/dL (ref 70–99)
Glucose-Capillary: 84 mg/dL (ref 70–99)

## 2014-03-18 MED ORDER — HYDROCODONE-ACETAMINOPHEN 5-325 MG PO TABS
1.0000 | ORAL_TABLET | Freq: Four times a day (QID) | ORAL | Status: DC | PRN
  Administered 2014-03-18 – 2014-03-19 (×3): 1 via ORAL
  Filled 2014-03-18 (×3): qty 1

## 2014-03-18 MED ORDER — INSULIN GLARGINE 100 UNIT/ML ~~LOC~~ SOLN
40.0000 [IU] | Freq: Every day | SUBCUTANEOUS | Status: DC
  Filled 2014-03-18: qty 0.4

## 2014-03-18 MED ORDER — INSULIN GLARGINE 100 UNIT/ML ~~LOC~~ SOLN
35.0000 [IU] | Freq: Every day | SUBCUTANEOUS | Status: DC
  Administered 2014-03-18: 35 [IU] via SUBCUTANEOUS
  Filled 2014-03-18: qty 0.35

## 2014-03-18 MED ORDER — AMLODIPINE BESYLATE 10 MG PO TABS
10.0000 mg | ORAL_TABLET | Freq: Every day | ORAL | Status: DC
  Administered 2014-03-18 – 2014-03-20 (×3): 10 mg via ORAL
  Filled 2014-03-18 (×3): qty 1

## 2014-03-18 MED ORDER — CARVEDILOL 3.125 MG PO TABS
3.1250 mg | ORAL_TABLET | Freq: Two times a day (BID) | ORAL | Status: DC
  Administered 2014-03-18 – 2014-03-19 (×3): 3.125 mg via ORAL
  Filled 2014-03-18 (×5): qty 1

## 2014-03-18 NOTE — Progress Notes (Signed)
CRITICAL VALUE ALERT  Critical value received:  CBG 59   Date of notification:  03/18/2014   Time of notification:  [id number]   Critical value read back:  Nurse who received alert:  Durene Cal, RN  MD notified (1st page):  Dr Candiss Norse  Time of first page:  [phone]   MD notified (2nd page):  Time of second page:  Responding MD:   Time MD responded:

## 2014-03-18 NOTE — Progress Notes (Signed)
Consult Note                                            Patient Demographics  Carolyn Valenzuela, is a 63 y.o. female, DOB - 27-Jun-1951, [id number]  Admit date - 03/15/2014   Admitting Physician [name], MD  Outpatient Primary MD for the patient is Cathlean Cower, MD  LOS - 3   No chief complaint on file.      Subjective:   Carolyn Valenzuela today has, No headache, No chest pain, No abdominal pain , No new weakness tingling or numbness, No Cough - SOB.     Assessment & Plan    1. Sepsis due to Right foot wet diabetic gangrene -  status post BKA by orthopedics on 03/15/2014, orthopedics are the primary team, status likely reactionary, she is afebrile and with improving leukocytosis, antibiotics stopped on 03/18/2014 which were vancomycin and Zosyn given for 3 days. Chest x-ray stable. Will check UA, We'll continue to monitor.     2. DM 2. Noted A1c, sugars drop in the morning of 03/17/2014, reduced Lantus change to AM , discontinued oral Trajenta, continue sliding scale.  Lab Results  Component Value Date   HGBA1C 8.2* 03/15/2014    CBG (last 3)   Recent Labs  03/17/14 1617 03/17/14 2244 03/17/14 2328  GLUCAP 158* 58* 146*      3. Hypertension. Blood pressure running high, have increased Norvasc added Coreg, as needed IV hydralazine.     4. ESRD. Currently not on dialysis, she has a nonfunctioning fistula in the left arm, follows with Dr. Moshe Cipro, will monitor renal function and symptoms of uremia. Creatinine actually appears to have much improved.    5. Dyslipidemia. Continue home dose statin.     6. Acute operative blood loss related anemia in a patient with Chronic severe iron deficiency anemia.  Requiring frequent outpatient iron transfusions, she status post 2 units of packed RBC transfusion on 03/15/2014 with stable H&H, continue to monitor.     7. Hyponatremia. Worse after IV fluids, however serum and urine osmolality almost same, urine sodium 51. Likely SIADH, will continue Lasix at home dose for one more day and sodium improving.      Code Status: Full  Family Communication: Sisters bedside    Medications  Scheduled Meds: . amLODipine  10 mg Oral Daily  . aspirin EC  81 mg Oral Daily  . calcitRIOL  0.5 mcg Oral Daily  . docusate sodium  200 mg Oral BID  . ferrous sulfate  325 mg Oral Q breakfast  . furosemide  40 mg Oral BID  . insulin aspart  0-15 Units Subcutaneous TID WC  .  insulin glargine  35 Units Subcutaneous Daily  . multivitamin with minerals  1 tablet Oral Daily  . polyethylene glycol  17 g Oral BID  . senna  2 tablet Oral BID  . simvastatin  20 mg Oral QHS   Continuous Infusions:  PRN Meds:.baclofen, diphenhydrAMINE, hydrALAZINE, HYDROcodone-acetaminophen, morphine injection, ondansetron (ZOFRAN) IV   DVT Prophylaxis per primary team can consider heparin 5000 subcutaneous 3 times a day    Lab Results  Component Value Date   PLT 449* 03/18/2014    Antibiotics     Anti-infectives   Start     Dose/Rate Route Frequency Ordered Stop   03/15/14 2100  ceFAZolin (ANCEF) IVPB 1 g/50 mL premix  Status:  Discontinued     1 g 100 mL/hr over 30 Minutes Intravenous Every 6 hours 03/15/14 1659 03/15/14 1724   03/15/14 1730  vancomycin (VANCOCIN) IVPB 750 mg/150 ml premix  Status:  Discontinued     750 mg 150 mL/hr over 60 Minutes Intravenous Every 48 hours 03/15/14 1724 03/17/14 0815   03/15/14 1730  piperacillin-tazobactam (ZOSYN) IVPB 2.25 g  Status:  Discontinued     2.25 g 100 mL/hr over 30 Minutes Intravenous 3 times per day 03/15/14 1724 03/17/14 0815   03/15/14 0600  ceFAZolin (ANCEF) IVPB 2 g/50 mL premix     2 g 100 mL/hr over 30 Minutes  Intravenous On call to O.R. 03/14/14 1432 03/15/14 1454          Objective:   Filed Vitals:   03/17/14 2135 03/17/14 2300 03/18/14 0300 03/18/14 0649  BP: 199/72 180/68  195/77  Pulse:  88  91  Temp:    98.5 F (36.9 C)  TempSrc:    Oral  Resp: 18  18 18   SpO2: 100%   100%    Wt Readings from Last 3 Encounters:  03/09/14 56.246 kg (124 lb)  03/08/14 58.06 kg (128 lb)  01/09/14 56.518 kg (124 lb 9.6 oz)     Intake/Output Summary (Last 24 hours) at 03/18/14 0910 Last data filed at 03/18/14 0650  Gross per 24 hour  Intake    920 ml  Output    300 ml  Net    620 ml     Physical Exam  Awake Alert, Oriented X 3, No new F.N deficits, Normal affect Oriental.AT,PERRAL Supple Neck,No JVD, No cervical lymphadenopathy appriciated.  Symmetrical Chest wall movement, Good air movement bilaterally, CTAB RRR,No Gallops,Rubs or new Murmurs, No Parasternal Heave +ve B.Sounds, Abd Soft, No tenderness, No organomegaly appriciated, No rebound - guarding or rigidity. No Cyanosis, Clubbing or edema, No new Rash or bruise, right BKA stump under bandage and splint.   Data Review   Micro Results Recent Results (from the past 240 hour(s))  CULTURE, BLOOD (ROUTINE X 2)     Status: None   Collection Time    03/15/14  6:50 PM      Result Value Ref Range Status   Specimen Description BLOOD RIGHT HAND   Final   Special Requests BOTTLES DRAWN AEROBIC AND ANAEROBIC 5CC EACH   Final   Culture  Setup Time     Final   Value: 03/16/2014 01:19     Performed at [facility]   Culture     Final   Value:        BLOOD CULTURE RECEIVED NO GROWTH TO DATE CULTURE WILL BE HELD FOR 5 DAYS BEFORE ISSUING A FINAL NEGATIVE REPORT     Performed at [facility]   Report Status PENDING   Incomplete  CULTURE, BLOOD (ROUTINE X 2)     Status: None   Collection Time    03/15/14  7:00 PM      Result Value Ref Range Status   Specimen Description BLOOD RIGHT WRIST   Final   Special Requests BOTTLES  DRAWN AEROBIC AND ANAEROBIC 5CC EACH   Final   Culture  Setup Time     Final   Value: 03/16/2014 01:20     Performed at [facility]   Culture     Final   Value:        BLOOD CULTURE RECEIVED NO GROWTH TO DATE CULTURE WILL BE HELD FOR 5 DAYS BEFORE ISSUING A FINAL NEGATIVE REPORT     Performed at [facility]   Report Status PENDING   Incomplete  URINE CULTURE     Status: None   Collection Time    03/16/14  9:00 AM      Result Value Ref Range Status   Specimen Description URINE, RANDOM   Final   Special Requests NONE   Final   Culture  Setup Time     Final   Value: 03/16/2014 18:00     Performed at [facility]     Final   Value: NO GROWTH     Performed at [facility]   Culture     Final   Value: NO GROWTH     Performed at [facility]   Report Status 03/17/2014 FINAL   Final    Radiology Reports Dg Chest 2 View  03/15/2014   CLINICAL DATA:  Preop prior to foot surgery; history of diabetes and hypertension  EXAM: CHEST  2 VIEW  COMPARISON:  PA and lateral chest x-ray dated March 26, 2010  FINDINGS: The lungs are adequately inflated and clear. The heart is top-normal in size. The pulmonary vascularity is normal. There is no pleural effusion. The bony thorax is unremarkable.  IMPRESSION: There is no active cardiopulmonary disease.   Electronically Signed   By: David  Martinique   On: 03/15/2014 12:37    CBC  Recent Labs Lab 03/15/14 1200 03/15/14 1850 03/15/14 2035 03/16/14 1030 03/17/14 0410 03/18/14 0415  WBC 21.3*  --   --  20.2* 18.4* 17.4*  HGB 7.4* 4.0* 5.8* 9.1* 8.6* 8.6*  HCT 23.4* 12.0* 17.8* 27.1* 25.9* 25.8*  PLT 709*  --   --  455* 455* 449*  MCV 89.3  --   --  84.4 86.0 86.0  MCH 28.2  --   --  28.3 28.6 28.7  MCHC 31.6  --   --  33.6 33.2 33.3  RDW 16.2*  --   --  15.9* 16.2* 16.0*    Chemistries   Recent Labs Lab 03/15/14 1200 03/16/14 0645 03/17/14 0410 03/18/14 0415  NA 132* 125* 127* 131*  K  4.4 4.4 4.4 3.9  CL 91* 89* 90* 92*  CO2 21 18* 19 20  GLUCOSE 295* 207* 97 66*  BUN 65* 56* 54* 53*  CREATININE 6.42* 5.42* 5.06* 5.20*  CALCIUM 10.3 8.6 9.2 9.0   ------------------------------------------------------------------------------------------------------------------ CrCl is unknown because both a height and weight (above a minimum accepted value) are required for this calculation. ------------------------------------------------------------------------------------------------------------------  Recent Labs  03/15/14 1850  HGBA1C 8.2*   ------------------------------------------------------------------------------------------------------------------ No results found for this basename: CHOL, HDL, LDLCALC, TRIG, CHOLHDL, LDLDIRECT,  in the last 72 hours ------------------------------------------------------------------------------------------------------------------ No results  found for this basename: TSH, T4TOTAL, FREET3, T3FREE, THYROIDAB,  in the last 72 hours ------------------------------------------------------------------------------------------------------------------ No results found for this basename: VITAMINB12, FOLATE, FERRITIN, TIBC, IRON, RETICCTPCT,  in the last 72 hours  Coagulation profile No results found for this basename: INR, PROTIME,  in the last 168 hours  No results found for this basename: DDIMER,  in the last 72 hours  Cardiac Enzymes No results found for this basename: CK, CKMB, TROPONINI, MYOGLOBIN,  in the last 168 hours ------------------------------------------------------------------------------------------------------------------ No components found with this basename: POCBNP,      Time Spent in minutes  35   Lala Lund K M.D on 03/18/2014 at 9:10 AM  Between 7am to 7pm - Pager - [phone]  After 7pm go to [email or web address] - password [email or web address]  And look for the night coverage person covering for me after hours  [facility] Office  [phone]   **Disclaimer: This note may have been dictated with voice recognition software. Similar sounding words can inadvertently be transcribed and this note may contain transcription errors which may not have been corrected upon publication of note.**

## 2014-03-18 NOTE — Progress Notes (Signed)
Carolyn Valenzuela  MRN: [id number] DOB/Age: [birth year] 63 y.o. Physician: Ander Slade, M.D. 3 Days Post-Op Procedure(s) (LRB): RIGHT  LEG  BELOW KNEE AMPUTATION  (Right)  Subjective: Resting comfortably in bed, according to staff mobilizing well. Labile BP's and blood sugars according to nursing staff Vital Signs Temp:  [97.9 F (36.6 C)-98.6 F (37 C)] 98.5 F (36.9 C) (07/19 0921) Pulse Rate:  [88-100] 94 (07/19 0921) Resp:  [16-18] 16 (07/19 0921) BP: (157-199)/(61-77) 187/61 mmHg (07/19 0921) SpO2:  [92 %-100 %] 98 % (07/19 0921)  Lab Results  Recent Labs  03/17/14 0410 03/18/14 0415  WBC 18.4* 17.4*  HGB 8.6* 8.6*  HCT 25.9* 25.8*  PLT 455* 449*   BMET  Recent Labs  03/17/14 0410 03/18/14 0415  NA 127* 131*  K 4.4 3.9  CL 90* 92*  CO2 19 20  GLUCOSE 97 66*  BUN 54* 53*  CREATININE 5.06* 5.20*  CALCIUM 9.2 9.0   No results found for this basename: inr     Exam  Dressings clean and dry, immobilizer in place.  Plan Appreciate hospitalists  Help with this complicated patient. Plan for consult to [facility]. Carolyn Valenzuela M 03/18/2014, 9:36 AM

## 2014-03-18 NOTE — Progress Notes (Signed)
Patient CBG 59 with no s/s of hypoglycemcia.  MD notified, new orders of 1/2 amp of D50 to be given, CBG at 17:30 x1 time, then CBG q4hrs with no SSI coverage.  Also give a snack at 2200.  Patient has no IV site and refused to have one started.  CBG now 76, ate 100% of meal.

## 2014-03-18 NOTE — Progress Notes (Signed)
Clinical Social Work Department BRIEF PSYCHOSOCIAL ASSESSMENT 03/18/2014  Patient:  Carolyn Valenzuela, Carolyn Valenzuela     Account Number:  [id number]     Admit date:  03/15/2014  Clinical Social Worker:  Rolinda Roan  Date/Time:  03/18/2014 05:47 PM  Referred by:  Physician  Date Referred:  03/16/2014 Referred for  SNF Placement   Other Referral:   Interview type:  Patient Other interview type:    PSYCHOSOCIAL DATA Living Status:  [place] Admitted from facility:   Level of care:   Primary support name:  [name] Primary support relationship to patient:  SIBLING Degree of support available:   Good support at bedside.    CURRENT CONCERNS  Other Concerns:    SOCIAL WORK ASSESSMENT / PLAN Clinical Social Worker (CSW) met with patient to discuss SNF placement. Patient's sister [name] was at bedside. Patient reported that she has been staying between her 2 sisters. Patient is agreeable to SNF search in [facility] but prefers [facility]. Patient's sister [name] reported that she prefers [facility] in [place].   Assessment/plan status:  Psychosocial Support/Ongoing Assessment of Needs Other assessment/ plan:   Information/referral to community resources:   CSW gave sister SNF list.    PATIENT'S/FAMILY'S RESPONSE TO PLAN OF CARE: Patient and sister thanked CSW for visit and assisting with placement process.

## 2014-03-18 NOTE — Progress Notes (Signed)
Hypoglycemic Event  CBG: 58   Treatment: 15 GM carbohydrate snack  Symptoms: None  Follow-up CBG: Time:0715 CBG Result:84   Possible Reasons for Event: Inadequate meal intake  Comments/MD notified:    Carolyn Valenzuela A  Remember to initiate Hypoglycemia Order Set & completeAdult Hypoglycemia Protocol Treatment Guidelines  1. RN shall initiate Hypoglycemia Protocol emergency measures immediately when:            w        Routine or STAT CBG and/or a lab glucose indicates hypoglycemia (CBG < 70 mg/dl)  2. Treat the patient according to ability to take PO's and severity of hypoglycemia.   3. If patient is on GlucoStabilizer, follow directions provided by the [facility] for hypoglycemic events.  4. If patient on insulin pump, follow Hypoglycemia Protocol.  If patient requires more than one treatment have patient place pump in SUSPEND and notify MD.  DO NOT leave pump in SUSPEND for greater than 30 minutes unless ordered by MD.  A. Treatment for Mild or Moderate-Patient cooperative and able to swallow    1.  Patient taking PO's and can cooperate   a.  Give one of the following 15 gram CHO options:                           w     1 tube oral dextrose gel                           w     3-4 Glucose tablets                           w     4 oz. Juice                           w     4 oz. regular soda                                    ESRD patients:  clear, regular soda                           w     8 oz. skim milk    b.  Recheck CBG in 15 minutes after treatment                            w       If CBG < 70 mg/dl, repeat treatment and recheck until hypoglycemia is resolved                            w       If CBG > 70 mg/dl and next meal is more than 1 hour away, give additional 15 grams CHO   2.  Patient NPO-Patient cooperative and no altered mental status    a.  Give 25 ml of D50 IV.   b.  Recheck CBG in 15 minutes after treatment.                             w  If CBG is less than 70 mg/dl, repeat treatment and recheck until hypoglycemia is resolved.   c.  Notify MD for further orders.             SPECIAL CONSIDERATIONS:    a.  If no IV access,                              w        Start IV of D5W at [facility]                             w        Give 25 ml of D50 IV.    b.  If unable to gain IV access                             w          Give Glucagon IM:     i.  1 mg if patient weighs more than 45.5 kg     ii.  0.5 mg if patient weighs less than 45.5 kg   c.  Notify MD for further orders  B. Treatment for Severe-- Patient unconscious or unable to take PO's safely    1.  Position patient on side   2.  Give 50 ml D50 IV   3.  Recheck CBG in 15 minutes.                    w      If CBG is less than 70 mg/dl, repeat treatment and recheck until hypoglycemia is resolved.   4.  Notify MD for further orders.    SPECIAL CONSIDERATIONS:    a.  If no IV access                              w     Give Glucagon IM                                              i.  1 mg if patient weighs more than 45.5 kg                                             ii.  0.5 mg if patient weighs less than 45.5 kg                              w      Start IV of D5W at 50 ml/hr and give 50 ml D50 IV   b.  If no IV access and active seizure                               w       Call Rapid Response   c.  If unable to gain IV access, give Glucagon IM:  w          1 mg if patient weighs more than 45.5 kg                              w          0.5 mg if patient weighs less than 45.5 kg   d.  Notify MD for further orders.  C. Complete smart text progress note to document intervention and follow-up CBG   1. In [facility] patient chart, click on Notes (left side of screen)   2. Create Progress Note   3. Click on [employer].  In the Match box type "hypo" and enter    4. Double click on [facility] IP HYPOGLYCEMIC EVENT and enter data   5. MD must  be notified if patient is NPO or experienced severe hypoglycemia

## 2014-03-18 NOTE — Progress Notes (Signed)
Clinical Social Work Department CLINICAL SOCIAL WORK PLACEMENT NOTE 03/18/2014  Patient:  Carolyn Valenzuela, Carolyn Valenzuela  Account Number:  [id number] [place] date:  03/15/2014  Clinical Social Worker:  Blima Rich, Latanya Presser  Date/time:  03/18/2014 05:46 PM  Clinical Social Work is seeking post-discharge placement for this patient at the following level of care:   SKILLED NURSING   (*CSW will update this form in Epic as items are completed)   03/18/2014  Patient/family provided with [facility] Department of Clinical Social Work's list of facilities offering this level of care within the geographic area requested by the patient (or if unable, by the patient's family).  03/18/2014  Patient/family informed of their freedom to choose among providers that offer the needed level of care, that participate in Medicare, Medicaid or managed care program needed by the patient, have an available bed and are willing to accept the patient.  03/18/2014  Patient/family informed of [facility]' ownership interest in [facility], as well as of the fact that they are under no obligation to receive care at this facility.  PASARR submitted to EDS on 03/18/2014 PASARR number received on 03/18/2014  FL2 transmitted to all facilities in geographic area requested by pt/family on  03/18/2014 FL2 transmitted to all facilities within larger geographic area on   Patient informed that his/her managed care company has contracts with or will negotiate with  certain facilities, including the following:     Patient/family informed of bed offers received:   Patient chooses bed at  Physician recommends and patient chooses bed at    Patient to be transferred to  on   Patient to be transferred to facility by  Patient and family notified of transfer on  Name of family member notified:    The following physician request were entered in Epic:   Additional Comments:

## 2014-03-19 ENCOUNTER — Encounter (HOSPITAL_BASED_OUTPATIENT_CLINIC_OR_DEPARTMENT_OTHER): Payer: BC Managed Care – PPO | Attending: Plastic Surgery

## 2014-03-19 DIAGNOSIS — S88119A Complete traumatic amputation at level between knee and ankle, unspecified lower leg, initial encounter: Secondary | ICD-10-CM

## 2014-03-19 DIAGNOSIS — L98499 Non-pressure chronic ulcer of skin of other sites with unspecified severity: Secondary | ICD-10-CM

## 2014-03-19 DIAGNOSIS — I739 Peripheral vascular disease, unspecified: Secondary | ICD-10-CM

## 2014-03-19 LAB — TYPE AND SCREEN
ABO/RH(D): O POS
Antibody Screen: NEGATIVE
Unit division: 0
Unit division: 0
Unit division: 0
Unit division: 0

## 2014-03-19 LAB — GLUCOSE, CAPILLARY
Glucose-Capillary: 42 mg/dL — CL (ref 70–99)
Glucose-Capillary: 95 mg/dL (ref 70–99)
Glucose-Capillary: 112 mg/dL — ABNORMAL HIGH (ref 70–99)
Glucose-Capillary: 145 mg/dL — ABNORMAL HIGH (ref 70–99)
Glucose-Capillary: 129 mg/dL — ABNORMAL HIGH (ref 70–99)
Glucose-Capillary: 322 mg/dL — ABNORMAL HIGH (ref 70–99)

## 2014-03-19 MED ORDER — DOCUSATE SODIUM 100 MG PO CAPS
100.0000 mg | ORAL_CAPSULE | Freq: Every day | ORAL | Status: DC
  Filled 2014-03-19: qty 1

## 2014-03-19 MED ORDER — INSULIN GLARGINE 100 UNIT/ML ~~LOC~~ SOLN
20.0000 [IU] | Freq: Every day | SUBCUTANEOUS | Status: DC
  Administered 2014-03-20: 20 [IU] via SUBCUTANEOUS
  Filled 2014-03-19 (×2): qty 0.2

## 2014-03-19 MED ORDER — CARVEDILOL 6.25 MG PO TABS
6.2500 mg | ORAL_TABLET | Freq: Two times a day (BID) | ORAL | Status: DC
  Administered 2014-03-19 – 2014-03-20 (×2): 6.25 mg via ORAL
  Filled 2014-03-19 (×4): qty 1

## 2014-03-19 MED ORDER — BACLOFEN 10 MG PO TABS
10.0000 mg | ORAL_TABLET | Freq: Three times a day (TID) | ORAL | Status: DC | PRN
  Filled 2014-03-19: qty 1

## 2014-03-19 MED ORDER — HYDROCODONE-ACETAMINOPHEN 5-325 MG PO TABS
1.0000 | ORAL_TABLET | ORAL | Status: DC | PRN
  Administered 2014-03-19 – 2014-03-20 (×3): 2 via ORAL
  Filled 2014-03-19 (×3): qty 2

## 2014-03-19 MED ORDER — INSULIN GLARGINE 100 UNIT/ML ~~LOC~~ SOLN
30.0000 [IU] | Freq: Every day | SUBCUTANEOUS | Status: DC
  Filled 2014-03-19: qty 0.3

## 2014-03-19 MED ORDER — INSULIN ASPART 100 UNIT/ML ~~LOC~~ SOLN
0.0000 [IU] | Freq: Three times a day (TID) | SUBCUTANEOUS | Status: DC
  Administered 2014-03-19: 1 [IU] via SUBCUTANEOUS
  Administered 2014-03-20: 5 [IU] via SUBCUTANEOUS

## 2014-03-19 MED ORDER — INSULIN GLARGINE 100 UNIT/ML ~~LOC~~ SOLN
25.0000 [IU] | Freq: Every day | SUBCUTANEOUS | Status: DC
  Filled 2014-03-19: qty 0.25

## 2014-03-19 NOTE — H&P (Signed)
Physical Medicine and Rehabilitation Admission H&P    No chief complaint on file. :  Chief complaint: Leg pain  HPI: Carolyn Valenzuela is a 62 y.o. right-handed female with history of poorly controlled diabetes mellitus with peripheral neuropathy, chronic anemia requiring multiple outpatient iron transfusions, chronic renal insufficiency with baseline creatinine 6.42 currently not on dialysis followed by renal services. Patient was independent with a cane prior to admission living alone with good supportive of family. Presented 03/15/2014 with progressive gangrenous ischemic changes right lower extremity. Wound was not felt to be salvageable and underwent right below-knee amputation 03/16/2014 per Dr. Doran Durand. Postoperative pain management. Acute blood loss anemia 5.8 and transfused with  latest hemoglobin 8.6. Renal function monitored closely with creatinine 5.20 on 03/18/2014 and patient had been followed as outpatient by renal services Dr. Moshe Cipro. Physical therapy completed and ongoing with recommendations for physical medicine rehabilitation consult. Admitted for comprehensive rehabilitation program   ROS Review of Systems  Constitutional: Positive for malaise/fatigue.  Gastrointestinal: Positive for constipation.  Musculoskeletal: Positive for back pain.  All other systems reviewed and are negative  Past Medical History  Diagnosis Date  . DIABETES MELLITUS, UNCONTROLLED 05/21/2009  . HYPERLIPIDEMIA 03/30/2007  . ANEMIA-IRON DEFICIENCY 01/25/2008  . HYPERTENSION 01/25/2008  . SINUSITIS- ACUTE-NOS 01/25/2008  . SECONDARY HYPERPARATHYROIDISM 05/21/2009  . RENAL INSUFFICIENCY 02/01/2008  . CERVICAL RADICULOPATHY, LEFT 01/25/2008  . BACK PAIN 05/02/2009  . NUMBNESS 05/21/2009  . Allergic rhinitis, cause unspecified 12/08/2010  . Foot ulcer     right lateral malleolus  . Critical lower limb ischemia    Past Surgical History  Procedure Laterality Date  . Av fistula placement Left   . Eye  surgery Bilateral     cataracts removed, left eye still has some oil in it.  . Colonoscopy    . Amputation Right 03/15/2014    Procedure: RIGHT  LEG  BELOW KNEE AMPUTATION ;  Surgeon: [name], MD;  Location: [facility];  Service: Orthopedics;  Laterality: Right;   Family History  Problem Relation Age of Onset  . Cancer Mother     Pancreatic   Social History:  reports that she has never smoked. She has never used smokeless tobacco. She reports that she does not drink alcohol or use illicit drugs. Allergies:  Allergies  Allergen Reactions  . Oxycodone Itching   Medications Prior to Admission  Medication Sig Dispense Refill  . amLODipine (NORVASC) 2.5 MG tablet Take 2.5 mg by mouth 2 (two) times daily.      [name] aspirin 81 MG EC tablet Take 81 mg by mouth daily.        . calcitRIOL (ROCALTROL) 0.5 MCG capsule Take 0.5 mcg by mouth daily.       . ferrous sulfate (FERROUSUL) 325 (65 FE) MG tablet Take 325 mg by mouth daily with breakfast.      . furosemide (LASIX) 40 MG tablet Take 40 mg by mouth 2 (two) times daily.      . Multiple Vitamin (MULTIVITAMIN WITH MINERALS) TABS tablet Take 1 tablet by mouth daily.      [name] oxyCODONE-acetaminophen (PERCOCET) 10-325 MG per tablet Take 1 tablet by mouth every 4 (four) hours as needed for pain.      . simvastatin (ZOCOR) 20 MG tablet Take 1 tablet (20 mg total) by mouth at bedtime.  90 tablet  3  . sitaGLIPtin (JANUVIA) 100 MG tablet Take 100 mg by mouth daily.      . insulin glargine (LANTUS)  100 unit/mL SOPN Inject 10-20 Units into the skin at bedtime. Depending on checked blood sugar        Home: Home Living Family/patient expects to be discharged to:: Inpatient rehab Living Arrangements: Other relatives (sister) Available Help at Discharge: Family (sister) Type of Home: House Home Access: Stairs to enter Technical brewer of Steps: 2 Entrance Stairs-Rails: None Home Layout: One level Home Equipment: Cane - single point Additional  Comments: Was living at home pta by herself and plans to go stay with her sister after a rehab stay   Functional History: Prior Function Level of Independence: Independent  Functional Status:  Mobility: Bed Mobility Overal bed mobility: Needs Assistance Bed Mobility: Sit to Supine Supine to sit: Mod assist;HOB elevated Sit to supine: Min guard General bed mobility comments: Min guard with sit>supine into bed. VC for technique and requires extra time but no physical assist to enter bed. Transfers Overall transfer level: Needs assistance Equipment used: Rolling walker (2 wheeled) Transfers: Sit to/from [facility] Sit to Stand: Min assist Stand pivot transfers: Mod assist Squat pivot transfers: Mod assist General transfer comment: Min assist for boost from recliner and onto bed with VC for hand placement. Mod assist with pivot for balance with posterior lean and and to control RW. Educated on safe DME use with rolling walker. Ambulation/Gait Ambulation/Gait assistance: Mod assist Ambulation Distance (Feet): 2 Feet Assistive device: Rolling walker (2 wheeled) Gait Pattern/deviations:  ("hop to" technique) Gait velocity interpretation: Below normal speed for age/gender General Gait Details: Able to take step forward and backwards with mod assist for balance and walker positioning.  VC to increase left foot clearance but only a modest improvement due to weakness.    ADL: ADL Overall ADL's : Needs assistance/impaired Eating/Feeding: Set up;Sitting Grooming: Set up;Sitting Upper Body Bathing: Set up;Sitting Lower Body Bathing: Maximal assistance;Sitting/lateral leans Upper Body Dressing : Set up;Sitting Lower Body Dressing: Maximal assistance;Sitting/lateral leans Toilet Transfer: Moderate assistance;Squat-pivot (Bed>recliner going to her left (non BKA side)) Toileting- Clothing Manipulation and Hygiene: Total assistance;Sit to/from stand (sit to partial  stand)  Cognition: Cognition Overall Cognitive Status: Within Functional Limits for tasks assessed Orientation Level: Oriented X4 Cognition Arousal/Alertness: Awake/alert Behavior During Therapy: WFL for tasks assessed/performed Overall Cognitive Status: Within Functional Limits for tasks assessed  Physical Exam: Blood pressure 162/80, pulse 86, temperature 98.8 F (37.1 C), temperature source Oral, resp. rate 18, height 5' (1.524 m), weight 54.432 kg (120 lb), SpO2 100.00%. Physical Exam Constitutional: She is oriented to person, place, and time.  HENT: oral mucosa pink missing teeth Head: Normocephalic.  Eyes: EOM are normal.  Neck: Normal range of motion. Neck supple. No thyromegaly present.  Cardiovascular: Normal rate and regular rhythm. No murmur or rub Respiratory: Effort normal and breath sounds normal. No respiratory distress.  GI: Soft. Bowel sounds are normal. She exhibits no distension. Non-tender Musculoskeletal:  Right BK wound is dressed. Has excellent knee ROM. Knee is expectedly tender.  Neurological: She is alert and oriented to person, place, and time.  UE 5/5 bilaterally delt, bicep, tricep, HI. LLE: 4- HF, 4/5 KE to 4/5 at ankle. Able to lift RLE agst gravity with ACE in place. No sensory deficits noted.  Skin:  BKA site is dressed and appropriately tender. Calluses noted over the right first toe and, metatarsal area, 5th toe dry/hardened also. No specific breakdown seen.  Psychiatric: She has a normal mood and affect. Her behavior is normal. Judgment and thought content normal  Results for orders placed during  the hospital encounter of 03/15/14 (from the past 48 hour(s))  GLUCOSE, CAPILLARY     Status: Abnormal   Collection Time    03/17/14  4:17 PM      Result Value Ref Range   Glucose-Capillary 158 (*) 70 - 99 mg/dL   Comment 1 Notify RN     Comment 2 Documented in Chart    GLUCOSE, CAPILLARY     Status: Abnormal   Collection Time    03/17/14 10:44 PM       Result Value Ref Range   Glucose-Capillary 58 (*) 70 - 99 mg/dL  GLUCOSE, CAPILLARY     Status: Abnormal   Collection Time    03/17/14 11:28 PM      Result Value Ref Range   Glucose-Capillary 146 (*) 70 - 99 mg/dL  CBC     Status: Abnormal   Collection Time    03/18/14  4:15 AM      Result Value Ref Range   WBC 17.4 (*) 4.0 - 10.5 K/uL   RBC 3.00 (*) 3.87 - 5.11 MIL/uL   Hemoglobin 8.6 (*) 12.0 - 15.0 g/dL   HCT 25.8 (*) 36.0 - 46.0 %   MCV 86.0  78.0 - 100.0 fL   MCH 28.7  26.0 - 34.0 pg   MCHC 33.3  30.0 - 36.0 g/dL   RDW 16.0 (*) 11.5 - 15.5 %   Platelets 449 (*) 150 - 400 K/uL  BASIC METABOLIC PANEL     Status: Abnormal   Collection Time    03/18/14  4:15 AM      Result Value Ref Range   Sodium 131 (*) 137 - 147 mEq/L   Potassium 3.9  3.7 - 5.3 mEq/L   Chloride 92 (*) 96 - 112 mEq/L   CO2 20  19 - 32 mEq/L   Glucose, Bld 66 (*) 70 - 99 mg/dL   BUN 53 (*) 6 - 23 mg/dL   Creatinine, Ser 5.20 (*) 0.50 - 1.10 mg/dL   Calcium 9.0  8.4 - 10.5 mg/dL   GFR calc non Af Amer 8 (*) >90 mL/min   GFR calc Af Amer 9 (*) >90 mL/min   Comment: (NOTE)     The eGFR has been calculated using the CKD EPI equation.     This calculation has not been validated in all clinical situations.     eGFR's persistently <90 mL/min signify possible Chronic Kidney     Disease.   Anion gap 19 (*) 5 - 15  GLUCOSE, CAPILLARY     Status: Abnormal   Collection Time    03/18/14  6:41 AM      Result Value Ref Range   Glucose-Capillary 58 (*) 70 - 99 mg/dL  GLUCOSE, CAPILLARY     Status: None   Collection Time    03/18/14  7:12 AM      Result Value Ref Range   Glucose-Capillary 84  70 - 99 mg/dL  GLUCOSE, CAPILLARY     Status: Abnormal   Collection Time    03/18/14  9:26 AM      Result Value Ref Range   Glucose-Capillary 217 (*) 70 - 99 mg/dL  GLUCOSE, CAPILLARY     Status: Abnormal   Collection Time    03/18/14 11:09 AM      Result Value Ref Range   Glucose-Capillary 205 (*) 70 - 99 mg/dL   GLUCOSE, CAPILLARY     Status: Abnormal   Collection Time  03/18/14  4:26 PM      Result Value Ref Range   Glucose-Capillary 59 (*) 70 - 99 mg/dL   Comment 1 Notify RN    GLUCOSE, CAPILLARY     Status: None   Collection Time    03/18/14  5:19 PM      Result Value Ref Range   Glucose-Capillary 76  70 - 99 mg/dL  GLUCOSE, CAPILLARY     Status: Abnormal   Collection Time    03/18/14  8:16 PM      Result Value Ref Range   Glucose-Capillary 142 (*) 70 - 99 mg/dL  GLUCOSE, CAPILLARY     Status: None   Collection Time    03/19/14 12:00 AM      Result Value Ref Range   Glucose-Capillary 73  70 - 99 mg/dL  GLUCOSE, CAPILLARY     Status: Abnormal   Collection Time    03/19/14  4:43 AM      Result Value Ref Range   Glucose-Capillary 42 (*) 70 - 99 mg/dL  GLUCOSE, CAPILLARY     Status: None   Collection Time    03/19/14  5:07 AM      Result Value Ref Range   Glucose-Capillary 95  70 - 99 mg/dL  GLUCOSE, CAPILLARY     Status: Abnormal   Collection Time    03/19/14  7:39 AM      Result Value Ref Range   Glucose-Capillary 112 (*) 70 - 99 mg/dL   No results found.     Medical Problem List and Plan: 1. Functional deficits secondary to right BKA secondary to gangrenous changes 03/16/2014 2.  DVT Prophylaxis/Anticoagulation: SCDs left lower extremity. Monitor for any signs of DVT 3. Pain Management: Baclofen 10 mg every 8 hours as needed muscle spasms, Hydrocodone as needed. Monitor with increased mobility 4. acute on chronic blood loss anemia. Patient has been transfused. Continue iron supplement. Followup CBC 5. Neuropsych: This patient is capable of making decisions on his own behalf. 6. Hypertension. Norvasc 10 mg daily, Coreg 6.25 mg twice a day, Lasix 40 mg twice a day. Monitor with increased mobility 7. Diabetes mellitus with peripheral neuropathy. Hemoglobin A1c 8.2. Lantus insulin 20 units daily. Check blood sugars a.c. and at bedtime. 8. Chronic renal insufficiency.  Baseline creatinine 6.42. Patient currently not on dialysis followed outpatient by renal services Dr. Moshe Cipro. 9. Hyperlipidemia. Zocor  Post Admission Physician Evaluation: 1. Functional deficits secondary  to right BKA. 2. Patient is admitted to receive collaborative, interdisciplinary care between the physiatrist, rehab nursing staff, and therapy team. 3. Patient's level of medical complexity and substantial therapy needs in context of that medical necessity cannot be provided at a lesser intensity of care such as a SNF. 4. Patient has experienced substantial functional loss from his/her baseline which was documented above under the "Functional History" and "Functional Status" headings.  Judging by the patient's diagnosis, physical exam, and functional history, the patient has potential for functional progress which will result in measurable gains while on inpatient rehab.  These gains will be of substantial and practical use upon discharge  in facilitating mobility and self-care at the household level. 5. Physiatrist will provide 24 hour management of medical needs as well as oversight of the therapy plan/treatment and provide guidance as appropriate regarding the interaction of the two. 6. 24 hour rehab nursing will assist with bladder management, bowel management, safety, skin/wound care, disease management, medication administration, pain management and patient education  and help  integrate therapy concepts, techniques,education, etc. 7. PT will assess and treat for/with: Lower extremity strength, range of motion, stamina, balance, functional mobility, safety, adaptive techniques and equipment, pain mgt, pre-prosthetic education, egosupport, caregiver education.   Goals are: supervision to mod I at w/c level. 8. OT will assess and treat for/with: ADL's, functional mobility, safety, upper extremity strength, adaptive techniques and equipment, pain mgt, stump protection, egosupport, community  reintegration.   Goals are: mod I to supervision. 9. SLP will assess and treat for/with: n/a.  Goals are: n/a. 10. Case Management and Social Worker will assess and treat for psychological issues and discharge planning. 11. Team conference will be held weekly to assess progress toward goals and to determine barriers to discharge. 12. Patient will receive at least 3 hours of therapy per day at least 5 days per week. 13. ELOS: 9-12 days       14. Prognosis:  excellent     Meredith Staggers, MD, [facility]   03/19/2014

## 2014-03-19 NOTE — Progress Notes (Signed)
Occupational Therapy Treatment Patient Details Name: Carolyn Valenzuela MRN: [id number] DOB: 1950/12/30 Today's Date: 03/19/2014    History of present illness 63 y.o. 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 PMHx:DM, HTN, renal disease, LUE with fistula, back pain   OT comments  This 63 yo making progress so one goal to be updated and 2 goals to be added.  Follow Up Recommendations  CIR    Equipment Recommendations   (TBD next venue)       Precautions / Restrictions Precautions Precautions: Fall Required Braces or Orthoses: Knee Immobilizer - Right Knee Immobilizer - Right: On at all times Restrictions Weight Bearing Restrictions: Yes RLE Weight Bearing: Non weight bearing       Mobility Bed Mobility Overal bed mobility: Needs Assistance Bed Mobility: Sit to Supine     Supine to sit: Min guard     General bed mobility comments: Pt up in recliner upon arrival  Transfers Overall transfer level: Needs assistance Equipment used: Rolling walker (2 wheeled) Transfers: Sit to/from Stand Sit to Stand: Min assist         General transfer comment: Min assist to steady RW but demonstrates good stability upon standing. VC for hand placement.    Balance Overall balance assessment: Needs assistance Sitting-balance support: No upper extremity supported (LLE supported) Sitting balance-Leahy Scale: Fair     Standing balance support: Bilateral upper extremity supported;During functional activity Standing balance-Leahy Scale: Poor                     ADL Overall ADL's : Needs assistance/impaired     Grooming: Wash/dry hands;Min guard;Standing (at sink)                   Toilet Transfer: Minimal assistance;Ambulation;RW;Comfort height toilet;Grab bars   Toileting- Clothing Manipulation and Hygiene: Min guard;Sitting/lateral lean                          Cognition   Behavior During Therapy: WFL for tasks  assessed/performed Overall Cognitive Status: Within Functional Limits for tasks assessed                                    Pertinent Vitals/ Pain       7/10 LLE; made RN aware and she gave pt pain meds while I was in room.         Frequency Min 2X/week     Progress Toward Goals  OT Goals(current goals can now be found in the care plan section)  Progress towards OT goals: Progressing toward goals (will update toilet transfer goal, add toileting hygiene/cloting goal, and add grooming goal)  Acute Rehab OT Goals Patient Stated Goal: to go to rehab then home with sister  Plan Discharge plan remains appropriate       End of Session Equipment Utilized During Treatment: Rolling walker   Activity Tolerance Patient tolerated treatment well   Patient Left in chair;with call bell/phone within reach   Nurse Communication Patient requests pain meds        Time: [id number] OT Time Calculation (min): 17 min  Charges: OT General Charges $OT Visit: 1 Procedure OT Treatments $Self Care/Home Management : 8-22 mins  Almon Register [id number] 03/19/2014, 1:36 PM

## 2014-03-19 NOTE — Progress Notes (Signed)
Pt's blood sugar at MN was 73. She has been eating well and had eaten her hs snack. One can of sprite given with crackers and peanut butter as well as apple sauce. Pt's sister at bedside socializing. Pt in good spirits and with minimal complaints.

## 2014-03-19 NOTE — Progress Notes (Signed)
Subjective: 4 Days Post-Op Procedure(s) (LRB): RIGHT  LEG  BELOW KNEE AMPUTATION  (Right) Patient reports pain as mild.  Low blood sugar this morning.  Objective: Vital signs in last 24 hours: Temp:  [98.2 F (36.8 C)-98.8 F (37.1 C)] 98.8 F (37.1 C) (07/20 0440) Pulse Rate:  [86-95] 86 (07/20 0440) Resp:  [16-18] 18 (07/20 0440) BP: (170-187)/(61-80) 180/73 mmHg (07/20 0440) SpO2:  [98 %-100 %] 100 % (07/20 0440) Weight:  [54.432 kg (120 lb)] 54.432 kg (120 lb) (07/20 0146)  Intake/Output from previous day: 07/19 0701 - 07/20 0700 In: 920 [P.O.:920] Out: -  Intake/Output this shift:     Recent Labs  03/16/14 1030 03/17/14 0410 03/18/14 0415  HGB 9.1* 8.6* 8.6*    Recent Labs  03/17/14 0410 03/18/14 0415  WBC 18.4* 17.4*  RBC 3.01* 3.00*  HCT 25.9* 25.8*  PLT 455* 449*    Recent Labs  03/17/14 0410 03/18/14 0415  NA 127* 131*  K 4.4 3.9  CL 90* 92*  CO2 19 20  BUN 54* 53*  CREATININE 5.06* 5.20*  GLUCOSE 97 66*  CALCIUM 9.2 9.0   No results found for this basename: LABPT, INR,  in the last 72 hours  PE  R bk stump without swelling or drainage.    Assessment/Plan: 4 Days Post-Op Procedure(s) (LRB): RIGHT  LEG  BELOW KNEE AMPUTATION  (Right) Stump shrinker sock starting today.  CIR eval pending.  [name] 03/19/2014, 7:49 AM

## 2014-03-19 NOTE — Progress Notes (Signed)
Orthopedic Tech Progress Note Patient Details:  Carolyn Valenzuela 1951-04-22 [id number] [facility] called for brace order Patient ID: [name], female   DOB: [birth year], 63 y.o.   MRN: [id number]   Fenton Foy 03/19/2014, 10:14 AM

## 2014-03-19 NOTE — Progress Notes (Signed)
Consult Note                                            Patient Demographics  Carolyn Valenzuela, is a 63 y.o. female, DOB - [birth year], [id number]  Admit date - 03/15/2014   Admitting Physician [name], MD  Outpatient Primary MD for the patient is Cathlean Cower, MD  LOS - 4   No chief complaint on file.      Subjective:   Carolyn Valenzuela today has, No headache, No chest pain, No abdominal pain , No new weakness tingling or numbness, No Cough - SOB.  Mild post amputation right BKA stump pain.   Assessment & Plan    Hospitalist team will sign off at this time. Kindly call us with questions. Please remember Lantus dose is 20 units once a day. She should be discharged on  20 units of subcutaneous Lantus every morning  Norvasc 10 mg daily, Coreg 6.25 mg by mouth twice a day, Lasix 40 mg by mouth daily, simvastatin 20 mg by mouth each bedtime along with aspirin 81 mg by mouth daily from our standpoint.     1. Sepsis due to Right foot wet diabetic gangrene -  status post BKA by orthopedics on 03/15/2014, orthopedics are the primary team, status likely reactionary, she is afebrile and with improving leukocytosis, antibiotics stopped on 03/18/2014 which were vancomycin and Zosyn given for 3 days. Chest x-ray stable. Will check UA, We'll continue to monitor.     2. DM 2. Noted A1c, persistent a.m. sugar drop, recheck home dose Lantus with the patient, home dose Lantus in our system was wrong she is actually taking 20 units of Lantus and not to 55, reduced Lantus dose and pharmacy requested to correct the home dose in the system , discontinued oral Trajenta, continue on low-dose sliding scale.  Lab Results  Component Value Date   HGBA1C 8.2* 03/15/2014    CBG (last 3)   Recent Labs  03/19/14 0443 03/19/14 0507 03/19/14 0739  GLUCAP 42* 95 112*      3. Hypertension. Blood pressure running high, have increased Norvasc added Coreg, as needed IV hydralazine.     4. ESRD. Currently not on dialysis, she has a nonfunctioning fistula in the left arm, follows with Dr. Moshe Cipro, will monitor renal function and symptoms of uremia. Creatinine actually appears to have much improved.    5. Dyslipidemia. Continue home dose statin.     6. Acute operative blood loss related anemia in a patient with Chronic severe iron deficiency anemia. Requiring frequent outpatient iron transfusions, she status post 2 units of packed RBC transfusion on 03/15/2014 with stable H&H, continue to monitor.  7. Hyponatremia. Worse after IV fluids, however serum and urine osmolality almost same, urine sodium 51. Likely SIADH, will continue Lasix at home dose for one more day and sodium improving.      Code Status: Full  Family Communication: Sisters bedside    Medications  Scheduled Meds: . amLODipine  10 mg Oral Daily  . aspirin EC  81 mg Oral Daily  . calcitRIOL  0.5 mcg Oral Daily  . carvedilol  6.25 mg Oral BID WC  . docusate sodium  100 mg Oral Daily  . ferrous sulfate  325 mg Oral Q breakfast  . furosemide  40 mg Oral BID  . insulin aspart  0-9 Units Subcutaneous TID WC  . insulin glargine  20 Units Subcutaneous Daily  . multivitamin with minerals  1 tablet Oral Daily  . senna  2 tablet Oral BID  . simvastatin  20 mg Oral QHS   Continuous Infusions:  PRN Meds:.baclofen, diphenhydrAMINE, hydrALAZINE, HYDROcodone-acetaminophen, morphine injection, ondansetron (ZOFRAN) IV   DVT Prophylaxis per primary team can consider heparin 5000 subcutaneous 3 times a day    Lab Results  Component Value Date   PLT 449* 03/18/2014    Antibiotics     Anti-infectives   Start     Dose/Rate Route Frequency Ordered  Stop   03/15/14 2100  ceFAZolin (ANCEF) IVPB 1 g/50 mL premix  Status:  Discontinued     1 g 100 mL/hr over 30 Minutes Intravenous Every 6 hours 03/15/14 1659 03/15/14 1724   03/15/14 1730  vancomycin (VANCOCIN) IVPB 750 mg/150 ml premix  Status:  Discontinued     750 mg 150 mL/hr over 60 Minutes Intravenous Every 48 hours 03/15/14 1724 03/17/14 0815   03/15/14 1730  piperacillin-tazobactam (ZOSYN) IVPB 2.25 g  Status:  Discontinued     2.25 g 100 mL/hr over 30 Minutes Intravenous 3 times per day 03/15/14 1724 03/17/14 0815   03/15/14 0600  ceFAZolin (ANCEF) IVPB 2 g/50 mL premix     2 g 100 mL/hr over 30 Minutes Intravenous On call to O.R. 03/14/14 1432 03/15/14 1454          Objective:   Filed Vitals:   03/18/14 2215 03/19/14 0146 03/19/14 0440 03/19/14 0828  BP:   180/73 162/80  Pulse: 90  86   Temp: 98.3 F (36.8 C)  98.8 F (37.1 C)   TempSrc: Oral  Oral   Resp: 18  18   Height:  5' (1.524 m)    Weight:  54.432 kg (120 lb)    SpO2: 100%  100%     Wt Readings from Last 3 Encounters:  03/19/14 54.432 kg (120 lb)  03/19/14 54.432 kg (120 lb)  03/09/14 56.246 kg (124 lb)     Intake/Output Summary (Last 24 hours) at 03/19/14 0919 Last data filed at 03/19/14 0915  Gross per 24 hour  Intake   1160 ml  Output      0 ml  Net   1160 ml     Physical Exam  Awake Alert, Oriented X 3, No new F.N deficits, Normal affect [place].AT,PERRAL Supple Neck,No JVD, No cervical lymphadenopathy appriciated.  Symmetrical Chest wall movement, Good air movement bilaterally, CTAB RRR,No Gallops,Rubs or new Murmurs, No Parasternal Heave +ve B.Sounds, Abd Soft, No tenderness, No organomegaly appriciated, No rebound - guarding or rigidity. No Cyanosis, Clubbing or edema, No new Rash or bruise, right BKA stump under bandage and splint.   Data Review   Micro Results Recent Results (from  the past 240 hour(s))  CULTURE, BLOOD (ROUTINE X 2)     Status: None   Collection Time     03/15/14  6:50 PM      Result Value Ref Range Status   Specimen Description BLOOD RIGHT HAND   Final   Special Requests BOTTLES DRAWN AEROBIC AND ANAEROBIC 5CC EACH   Final   Culture  Setup Time     Final   Value: 03/16/2014 01:19     Performed at [facility]   Culture     Final   Value:        BLOOD CULTURE RECEIVED NO GROWTH TO DATE CULTURE WILL BE HELD FOR 5 DAYS BEFORE ISSUING A FINAL NEGATIVE REPORT     Performed at [facility]   Report Status PENDING   Incomplete  CULTURE, BLOOD (ROUTINE X 2)     Status: None   Collection Time    03/15/14  7:00 PM      Result Value Ref Range Status   Specimen Description BLOOD RIGHT WRIST   Final   Special Requests BOTTLES DRAWN AEROBIC AND ANAEROBIC 5CC EACH   Final   Culture  Setup Time     Final   Value: 03/16/2014 01:20     Performed at [facility]   Culture     Final   Value:        BLOOD CULTURE RECEIVED NO GROWTH TO DATE CULTURE WILL BE HELD FOR 5 DAYS BEFORE ISSUING A FINAL NEGATIVE REPORT     Performed at [facility]   Report Status PENDING   Incomplete  URINE CULTURE     Status: None   Collection Time    03/16/14  9:00 AM      Result Value Ref Range Status   Specimen Description URINE, RANDOM   Final   Special Requests NONE   Final   Culture  Setup Time     Final   Value: 03/16/2014 18:00     Performed at [facility] Count     Final   Value: NO GROWTH     Performed at [facility]   Culture     Final   Value: NO GROWTH     Performed at [facility]   Report Status 03/17/2014 FINAL   Final    Radiology Reports Dg Chest 2 View  03/15/2014   CLINICAL DATA:  Preop prior to foot surgery; history of diabetes and hypertension  EXAM: CHEST  2 VIEW  COMPARISON:  PA and lateral chest x-ray dated March 26, 2010  FINDINGS: The lungs are adequately inflated and clear. The heart is top-normal in size. The pulmonary vascularity is normal. There is no pleural effusion.  The bony thorax is unremarkable.  IMPRESSION: There is no active cardiopulmonary disease.   Electronically Signed   By: David  [place]   On: 03/15/2014 12:37    CBC  Recent Labs Lab 03/15/14 1200 03/15/14 1850 03/15/14 2035 03/16/14 1030 03/17/14 0410 03/18/14 0415  WBC 21.3*  --   --  20.2* 18.4* 17.4*  HGB 7.4* 4.0* 5.8* 9.1* 8.6* 8.6*  HCT 23.4* 12.0* 17.8* 27.1* 25.9* 25.8*  PLT 709*  --   --  455* 455* 449*  MCV 89.3  --   --  84.4 86.0 86.0  MCH 28.2  --   --  28.3 28.6 28.7  MCHC 31.6  --   --  33.6 33.2 33.3  RDW 16.2*  --   --  15.9* 16.2* 16.0*    Chemistries   Recent Labs Lab 03/15/14 1200 03/16/14 0645 03/17/14 0410 03/18/14 0415  NA 132* 125* 127* 131*  K 4.4 4.4 4.4 3.9  CL 91* 89* 90* 92*  CO2 21 18* 19 20  GLUCOSE 295* 207* 97 66*  BUN 65* 56* 54* 53*  CREATININE 6.42* 5.42* 5.06* 5.20*  CALCIUM 10.3 8.6 9.2 9.0   ------------------------------------------------------------------------------------------------------------------ estimated creatinine clearance is 8.2 ml/min (by C-G formula based on Cr of 5.2). ------------------------------------------------------------------------------------------------------------------ No results found for this basename: HGBA1C,  in the last 72 hours ------------------------------------------------------------------------------------------------------------------ No results found for this basename: CHOL, HDL, LDLCALC, TRIG, CHOLHDL, LDLDIRECT,  in the last 72 hours ------------------------------------------------------------------------------------------------------------------ No results found for this basename: TSH, T4TOTAL, FREET3, T3FREE, THYROIDAB,  in the last 72 hours ------------------------------------------------------------------------------------------------------------------ No results found for this basename: VITAMINB12, FOLATE, FERRITIN, TIBC, IRON, RETICCTPCT,  in the last 72 hours  Coagulation  profile No results found for this basename: INR, PROTIME,  in the last 168 hours  No results found for this basename: DDIMER,  in the last 72 hours  Cardiac Enzymes No results found for this basename: CK, CKMB, TROPONINI, MYOGLOBIN,  in the last 168 hours ------------------------------------------------------------------------------------------------------------------ No components found with this basename: POCBNP,      Time Spent in minutes  35   Sarinah Doetsch K M.D on 03/19/2014 at 9:19 AM  Between 7am to 7pm - Pager - [phone]  After 7pm go to [email or web address] - password [email or web address]  And look for the night coverage person covering for me after hours  [facility] Office  [phone]   **Disclaimer: This note may have been dictated with voice recognition software. Similar sounding words can inadvertently be transcribed and this note may contain transcription errors which may not have been corrected upon publication of note.**

## 2014-03-19 NOTE — Progress Notes (Signed)
Physical Therapy Treatment Patient Details Name: Carolyn Valenzuela MRN: [id number] DOB: Aug 31, 1951 Today's Date: 03/19/2014    History of Present Illness 63 y.o. 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 PMHx:DM, HTN, renal disease, LUE with fistula, back pain    PT Comments    Pt progressing towards physical therapy goals, able to tolerate 2 bouts of ambulation today up to 20 feet with min assist. Reviewed therapeutic exercises to allow for optimal healing and future use of prosthetic limb. Patient is highly motivated to improve functional independence. Patient will continue to benefit from skilled physical therapy services to further improve independence with functional mobility.   Follow Up Recommendations  CIR     Equipment Recommendations  Rolling walker with 5" wheels;3in1 (PT);Wheelchair (measurements PT);Wheelchair cushion (measurements PT)    Recommendations for Other Services Rehab consult     Precautions / Restrictions Precautions Precautions: Fall Required Braces or Orthoses: Knee Immobilizer - Right Knee Immobilizer - Right: On at all times Restrictions Weight Bearing Restrictions: Yes RLE Weight Bearing: Non weight bearing    Mobility  Bed Mobility Overal bed mobility: Needs Assistance Bed Mobility: Sit to Supine     Supine to sit: Min guard     General bed mobility comments: Min guard for safety with VC for technique. Use of bed rail and HOB flat.  Transfers Overall transfer level: Needs assistance Equipment used: Rolling walker (2 wheeled) Transfers: Sit to/from Stand Sit to Stand: Min assist         General transfer comment: Min assist to steady RW but demonstrates good stability upon standing. VC for hand placement.  Ambulation/Gait Ambulation/Gait assistance: Min assist Ambulation Distance (Feet): 20 Feet (x2) Assistive device: Rolling walker (2 wheeled) Gait Pattern/deviations:  ("hop-to" pattern)   Gait velocity  interpretation: Below normal speed for age/gender General Gait Details: Min assist for balance and safety (pt reports she fell this weekend due to Rt hand slipping off of walker). Reviewed education for safe DME use and hand placement. VC for walker placement. Required seated rest break between bouts due to fatigue.   Stairs            Wheelchair Mobility    Modified Rankin (Stroke Patients Only)       Balance                                    Cognition Arousal/Alertness: Awake/alert Behavior During Therapy: WFL for tasks assessed/performed Overall Cognitive Status: Within Functional Limits for tasks assessed                      Exercises General Exercises - Lower Extremity Ankle Circles/Pumps: AROM;Left;10 reps;Supine Amputee Exercises Quad Sets: AROM;Right;10 reps;Supine Hip Extension: AROM;Right;10 reps;Standing Hip Flexion/Marching: AROM;Right;5 reps;Standing Knee Flexion: AROM;Right;Seated;10 reps Knee Extension: AROM;Right;Seated;10 reps Chair Push Up: Strengthening;10 reps;Seated;Both    General Comments        Pertinent Vitals/Pain Pt reports pain as "okay, unless I move my leg" Patient repositioned in chair for comfort.      Home Living                      Prior Function            PT Goals (current goals can now be found in the care plan section) Acute Rehab PT Goals Patient Stated Goal: to go to rehab then home with sister PT  Goal Formulation: With patient Time For Goal Achievement: 03/23/14 Potential to Achieve Goals: Good Progress towards PT goals: Progressing toward goals    Frequency  Min 5X/week    PT Plan Current plan remains appropriate    Co-evaluation             End of Session Equipment Utilized During Treatment: Gait belt;Right knee immobilizer Activity Tolerance: Patient tolerated treatment well Patient left: with call bell/phone within reach;with family/visitor present;in chair      Time: [id number] PT Time Calculation (min): 26 min  Charges:  $Gait Training: 8-22 mins $Therapeutic Exercise: 8-22 mins                    G Codes:     [employer], [place]  Ellouise Newer 03/19/2014, 12:54 PM

## 2014-03-19 NOTE — Progress Notes (Signed)
Rehab admissions - Evaluated for possible admission.  I met briefly with patient.  She would like inpatient rehab admission.  I will call state [employer] and open the case requesting acute inpatient rehab admission.  Call me for questions.  #[phone]

## 2014-03-19 NOTE — Consult Note (Signed)
Physical Medicine and Rehabilitation Consult Reason for Consult: Right BKA Referring Physician: Dr. Doran Durand   HPI: Carolyn Valenzuela is a 63 y.o. right-handed female with history of poorly controlled diabetes mellitus with peripheral neuropathy, chronic anemia requiring multiple outpatient iron transfusions, chronic renal insufficiency with baseline creatinine 6.42 currently not on dialysis followed by renal services. Patient was independent with a cane prior to admission living alone with good supportive of family. Presented 03/15/2014 with progressive gangrenous ischemic changes right lower extremity. Wound was not felt to be salvageable and underwent right below-knee dictation 03/16/2014 per Dr. Doran Durand. Postoperative pain management. Acute blood loss anemia 5.8 and transfused. Renal function monitored closely with creatinine 5.20 on 03/18/2014 and patient had been followed as outpatient by renal services Dr. Moshe Cipro. Physical therapy completed and ongoing with recommendations for physical medicine rehabilitation consult.   Review of Systems  Constitutional: Positive for malaise/fatigue.  Gastrointestinal: Positive for constipation.  Musculoskeletal: Positive for back pain.  All other systems reviewed and are negative.  Past Medical History  Diagnosis Date  . DIABETES MELLITUS, UNCONTROLLED 05/21/2009  . HYPERLIPIDEMIA 03/30/2007  . ANEMIA-IRON DEFICIENCY 01/25/2008  . HYPERTENSION 01/25/2008  . SINUSITIS- ACUTE-NOS 01/25/2008  . SECONDARY HYPERPARATHYROIDISM 05/21/2009  . RENAL INSUFFICIENCY 02/01/2008  . CERVICAL RADICULOPATHY, LEFT 01/25/2008  . BACK PAIN 05/02/2009  . NUMBNESS 05/21/2009  . Allergic rhinitis, cause unspecified 12/08/2010  . Foot ulcer     right lateral malleolus  . Critical lower limb ischemia    Past Surgical History  Procedure Laterality Date  . Av fistula placement Left   . Eye surgery Bilateral     cataracts removed, left eye still has some oil in it.  .  Colonoscopy    . Amputation Right 03/15/2014    Procedure: RIGHT  LEG  BELOW KNEE AMPUTATION ;  Surgeon: [name], MD;  Location: [facility];  Service: Orthopedics;  Laterality: Right;   Family History  Problem Relation Age of Onset  . Cancer Mother     Pancreatic   Social History:  reports that she has never smoked. She has never used smokeless tobacco. She reports that she does not drink alcohol or use illicit drugs. Allergies:  Allergies  Allergen Reactions  . Oxycodone Itching   Medications Prior to Admission  Medication Sig Dispense Refill  . amLODipine (NORVASC) 2.5 MG tablet Take 2.5 mg by mouth 2 (two) times daily.      [name] aspirin 81 MG EC tablet Take 81 mg by mouth daily.        . calcitRIOL (ROCALTROL) 0.5 MCG capsule Take 0.5 mcg by mouth daily.       . ferrous sulfate (FERROUSUL) 325 (65 FE) MG tablet Take 325 mg by mouth daily with breakfast.      . furosemide (LASIX) 40 MG tablet Take 40 mg by mouth 2 (two) times daily.      . Multiple Vitamin (MULTIVITAMIN WITH MINERALS) TABS tablet Take 1 tablet by mouth daily.      [name] oxyCODONE-acetaminophen (PERCOCET) 10-325 MG per tablet Take 1 tablet by mouth every 4 (four) hours as needed for pain.      . simvastatin (ZOCOR) 20 MG tablet Take 1 tablet (20 mg total) by mouth at bedtime.  90 tablet  3  . sitaGLIPtin (JANUVIA) 100 MG tablet Take 100 mg by mouth daily.      . insulin glargine (LANTUS) 100 unit/mL SOPN Inject 55 Units into the skin at bedtime.  Home: Home Living Family/patient expects to be discharged to:: Inpatient rehab Living Arrangements: Other relatives (sister) Available Help at Discharge: Family (sister) Type of Home: House Home Access: Stairs to enter Technical brewer of Steps: 2 Entrance Stairs-Rails: None Home Layout: One level Home Equipment: Cane - single point Additional Comments: Was living at home pta by herself and plans to go stay with her sister after a rehab stay  Functional  History: Prior Function Level of Independence: Independent Functional Status:  Mobility: Bed Mobility Overal bed mobility: Needs Assistance Bed Mobility: Sit to Supine Supine to sit: Mod assist;HOB elevated Sit to supine: Min guard General bed mobility comments: Min guard with sit>supine into bed. VC for technique and requires extra time but no physical assist to enter bed. Transfers Overall transfer level: Needs assistance Equipment used: Rolling walker (2 wheeled) Transfers: Sit to/from [facility] Sit to Stand: Min assist Stand pivot transfers: Mod assist Squat pivot transfers: Mod assist General transfer comment: Min assist for boost from recliner and onto bed with VC for hand placement. Mod assist with pivot for balance with posterior lean and and to control RW. Educated on safe DME use with rolling walker. Ambulation/Gait Ambulation/Gait assistance: Mod assist Ambulation Distance (Feet): 2 Feet Assistive device: Rolling walker (2 wheeled) Gait Pattern/deviations:  ("hop to" technique) Gait velocity interpretation: Below normal speed for age/gender General Gait Details: Able to take step forward and backwards with mod assist for balance and walker positioning.  VC to increase left foot clearance but only a modest improvement due to weakness.    ADL: ADL Overall ADL's : Needs assistance/impaired Eating/Feeding: Set up;Sitting Grooming: Set up;Sitting Upper Body Bathing: Set up;Sitting Lower Body Bathing: Maximal assistance;Sitting/lateral leans Upper Body Dressing : Set up;Sitting Lower Body Dressing: Maximal assistance;Sitting/lateral leans Toilet Transfer: Moderate assistance;Squat-pivot (Bed>recliner going to her left (non BKA side)) Toileting- Clothing Manipulation and Hygiene: Total assistance;Sit to/from stand (sit to partial stand)  Cognition: Cognition Overall Cognitive Status: Within Functional Limits for tasks assessed Orientation Level:  Oriented X4 Cognition Arousal/Alertness: Awake/alert Behavior During Therapy: WFL for tasks assessed/performed Overall Cognitive Status: Within Functional Limits for tasks assessed  Blood pressure 180/73, pulse 86, temperature 98.8 F (37.1 C), temperature source Oral, resp. rate 18, height 5' (1.524 m), weight 54.432 kg (120 lb), SpO2 100.00%. Physical Exam  Vitals reviewed. Constitutional: She is oriented to person, place, and time.  HENT:  Head: Normocephalic.  Eyes: EOM are normal.  Neck: Normal range of motion. Neck supple. No thyromegaly present.  Cardiovascular: Normal rate and regular rhythm.   Respiratory: Effort normal and breath sounds normal. No respiratory distress.  GI: Soft. Bowel sounds are normal. She exhibits no distension.  Musculoskeletal:  Right BK wound is dressed. Has good knee ROM.   Neurological: She is alert and oriented to person, place, and time.  UE 5/5. LLE appears intact and 4- HF to 4/5 at ankle. Able to lift RLE agst gravity.   Skin:  BKA site is dressed and appropriately tender  Psychiatric: She has a normal mood and affect. Her behavior is normal. Judgment and thought content normal.    Results for orders placed during the hospital encounter of 03/15/14 (from the past 24 hour(s))  GLUCOSE, CAPILLARY     Status: Abnormal   Collection Time    03/18/14  6:41 AM      Result Value Ref Range   Glucose-Capillary 58 (*) 70 - 99 mg/dL  GLUCOSE, CAPILLARY     Status: None   Collection  Time    03/18/14  7:12 AM      Result Value Ref Range   Glucose-Capillary 84  70 - 99 mg/dL  GLUCOSE, CAPILLARY     Status: Abnormal   Collection Time    03/18/14  9:26 AM      Result Value Ref Range   Glucose-Capillary 217 (*) 70 - 99 mg/dL  GLUCOSE, CAPILLARY     Status: Abnormal   Collection Time    03/18/14 11:09 AM      Result Value Ref Range   Glucose-Capillary 205 (*) 70 - 99 mg/dL  GLUCOSE, CAPILLARY     Status: Abnormal   Collection Time    03/18/14   4:26 PM      Result Value Ref Range   Glucose-Capillary 59 (*) 70 - 99 mg/dL   Comment 1 Notify RN    GLUCOSE, CAPILLARY     Status: None   Collection Time    03/18/14  5:19 PM      Result Value Ref Range   Glucose-Capillary 76  70 - 99 mg/dL  GLUCOSE, CAPILLARY     Status: Abnormal   Collection Time    03/18/14  8:16 PM      Result Value Ref Range   Glucose-Capillary 142 (*) 70 - 99 mg/dL  GLUCOSE, CAPILLARY     Status: None   Collection Time    03/19/14 12:00 AM      Result Value Ref Range   Glucose-Capillary 73  70 - 99 mg/dL  GLUCOSE, CAPILLARY     Status: Abnormal   Collection Time    03/19/14  4:43 AM      Result Value Ref Range   Glucose-Capillary 42 (*) 70 - 99 mg/dL  GLUCOSE, CAPILLARY     Status: None   Collection Time    03/19/14  5:07 AM      Result Value Ref Range   Glucose-Capillary 95  70 - 99 mg/dL   Dg Abd Portable 1v  03/17/2014   CLINICAL DATA:  Generalized abdominal pain.  EXAM: PORTABLE ABDOMEN - 1 VIEW  COMPARISON:  CT abdomen and pelvis 07/09/2011.  FINDINGS: Bowel gas pattern unremarkable without evidence of obstruction or significant ileus. Expected stool burden in the colon. Calcified, degenerated uterine fibroids, including a large fibroid approximating 8.5 x 5.5 cm. No visible opaque urinary tract calculi. Degenerative changes involving the lower lumbar spine.  IMPRESSION: 1. No acute abdominal abnormality. 2. Calcified, degenerated uterine fibroids.   Electronically Signed   By: Evangeline Dakin M.D.   On: 03/17/2014 09:17    Assessment/Plan: Diagnosis: right BKA 1. Does the need for close, 24 hr/day medical supervision in concert with the patient's rehab needs make it unreasonable for this patient to be served in a less intensive setting? Yes 2. Co-Morbidities requiring supervision/potential complications: DM, anemia, htn 3. Due to bladder management, bowel management, safety, skin/wound care, disease management, medication administration, pain  management and patient education, does the patient require 24 hr/day rehab nursing? Yes 4. Does the patient require coordinated care of a physician, rehab nurse, PT (1-2 hrs/day, 5 days/week) and OT (1-2 hrs/day, 5 days/week) to address physical and functional deficits in the context of the above medical diagnosis(es)? Yes Addressing deficits in the following areas: balance, endurance, locomotion, strength, transferring, bowel/bladder control, bathing, dressing, feeding, grooming, toileting and psychosocial support 5. Can the patient actively participate in an intensive therapy program of at least 3 hrs of therapy per day at least 5 days  per week? Yes 6. The potential for patient to make measurable gains while on inpatient rehab is excellent 7. Anticipated functional outcomes upon discharge from inpatient rehab are modified independent  with PT, modified independent with OT, n/a with SLP. 8. Estimated rehab length of stay to reach the above functional goals is: 10-13 days 9. Does the patient have adequate social supports to accommodate these discharge functional goals? Yes 10. Anticipated D/C setting: Home 11. Anticipated post D/C treatments: HH therapy and Outpatient therapy 12. Overall Rehab/Functional Prognosis: excellent  RECOMMENDATIONS: This patient's condition is appropriate for continued rehabilitative care in the following setting: CIR Patient has agreed to participate in recommended program. Yes Note that insurance prior authorization may be required for reimbursement for recommended care.  Comment: Rehab Admissions Coordinator to follow up. Pt is an excellent candidate. She is quite motivated and will stay with sister upon discharge  Thanks,  Meredith Staggers, MD, Mellody Drown     03/19/2014

## 2014-03-19 NOTE — Progress Notes (Signed)
Hypoglycemic Event  CBG: 42  Treatment: 15 GM carbohydrate snack  Symptoms: None  Follow-up CBG: Time:0510 CBG Result:95  Possible Reasons for Event:   Comments/MD notified:     Shatima Zalar A  Remember to initiate Hypoglycemia Order Set & complete

## 2014-03-20 ENCOUNTER — Inpatient Hospital Stay (HOSPITAL_COMMUNITY)
Admission: RE | Admit: 2014-03-20 | Discharge: 2014-03-30 | DRG: 945 | Disposition: A | Discharge status: Home / Self Care | Payer: BC Managed Care – PPO | Source: Intra-hospital | Attending: Physical Medicine & Rehabilitation | Admitting: Physical Medicine & Rehabilitation

## 2014-03-20 DIAGNOSIS — N189 Chronic kidney disease, unspecified: Secondary | ICD-10-CM | POA: Diagnosis present

## 2014-03-20 DIAGNOSIS — E1165 Type 2 diabetes mellitus with hyperglycemia: Secondary | ICD-10-CM

## 2014-03-20 DIAGNOSIS — S88119A Complete traumatic amputation at level between knee and ankle, unspecified lower leg, initial encounter: Secondary | ICD-10-CM

## 2014-03-20 DIAGNOSIS — D62 Acute posthemorrhagic anemia: Secondary | ICD-10-CM | POA: Diagnosis present

## 2014-03-20 DIAGNOSIS — E1149 Type 2 diabetes mellitus with other diabetic neurological complication: Secondary | ICD-10-CM | POA: Diagnosis present

## 2014-03-20 DIAGNOSIS — Z5189 Encounter for other specified aftercare: Principal | ICD-10-CM

## 2014-03-20 DIAGNOSIS — IMO0001 Reserved for inherently not codable concepts without codable children: Secondary | ICD-10-CM

## 2014-03-20 DIAGNOSIS — D500 Iron deficiency anemia secondary to blood loss (chronic): Secondary | ICD-10-CM | POA: Diagnosis present

## 2014-03-20 DIAGNOSIS — E785 Hyperlipidemia, unspecified: Secondary | ICD-10-CM | POA: Diagnosis present

## 2014-03-20 DIAGNOSIS — Z7982 Long term (current) use of aspirin: Secondary | ICD-10-CM

## 2014-03-20 DIAGNOSIS — N186 End stage renal disease: Secondary | ICD-10-CM

## 2014-03-20 DIAGNOSIS — Z992 Dependence on renal dialysis: Secondary | ICD-10-CM

## 2014-03-20 DIAGNOSIS — D72829 Elevated white blood cell count, unspecified: Secondary | ICD-10-CM | POA: Diagnosis not present

## 2014-03-20 DIAGNOSIS — L98499 Non-pressure chronic ulcer of skin of other sites with unspecified severity: Secondary | ICD-10-CM

## 2014-03-20 DIAGNOSIS — Z89519 Acquired absence of unspecified leg below knee: Secondary | ICD-10-CM

## 2014-03-20 DIAGNOSIS — Z794 Long term (current) use of insulin: Secondary | ICD-10-CM

## 2014-03-20 DIAGNOSIS — K5900 Constipation, unspecified: Secondary | ICD-10-CM | POA: Diagnosis not present

## 2014-03-20 DIAGNOSIS — E1142 Type 2 diabetes mellitus with diabetic polyneuropathy: Secondary | ICD-10-CM | POA: Diagnosis present

## 2014-03-20 DIAGNOSIS — I10 Essential (primary) hypertension: Secondary | ICD-10-CM | POA: Diagnosis not present

## 2014-03-20 DIAGNOSIS — I739 Peripheral vascular disease, unspecified: Secondary | ICD-10-CM | POA: Diagnosis not present

## 2014-03-20 DIAGNOSIS — I129 Hypertensive chronic kidney disease with stage 1 through stage 4 chronic kidney disease, or unspecified chronic kidney disease: Secondary | ICD-10-CM | POA: Diagnosis present

## 2014-03-20 LAB — GLUCOSE, CAPILLARY
Glucose-Capillary: 140 mg/dL — ABNORMAL HIGH (ref 70–99)
Glucose-Capillary: 108 mg/dL — ABNORMAL HIGH (ref 70–99)
Glucose-Capillary: 262 mg/dL — ABNORMAL HIGH (ref 70–99)
Glucose-Capillary: 251 mg/dL — ABNORMAL HIGH (ref 70–99)
Glucose-Capillary: 241 mg/dL — ABNORMAL HIGH (ref 70–99)

## 2014-03-20 MED ORDER — SIMVASTATIN 20 MG PO TABS
20.0000 mg | ORAL_TABLET | Freq: Every day | ORAL | Status: DC
  Administered 2014-03-20 – 2014-03-29 (×10): 20 mg via ORAL
  Filled 2014-03-20 (×11): qty 1

## 2014-03-20 MED ORDER — CARVEDILOL 6.25 MG PO TABS
6.2500 mg | ORAL_TABLET | Freq: Two times a day (BID) | ORAL | Status: DC
  Administered 2014-03-20 – 2014-03-22 (×4): 6.25 mg via ORAL
  Filled 2014-03-20 (×6): qty 1

## 2014-03-20 MED ORDER — SORBITOL 70 % SOLN: 30.0000 mL | Freq: Every day | Status: DC | PRN

## 2014-03-20 MED ORDER — ASPIRIN EC 81 MG PO TBEC
81.0000 mg | DELAYED_RELEASE_TABLET | Freq: Every day | ORAL | Status: DC
  Administered 2014-03-21 – 2014-03-30 (×10): 81 mg via ORAL
  Filled 2014-03-20 (×11): qty 1

## 2014-03-20 MED ORDER — HYDROCODONE-ACETAMINOPHEN 5-325 MG PO TABS
1.0000 | ORAL_TABLET | ORAL | Status: DC | PRN
  Administered 2014-03-20 – 2014-03-22 (×5): 2 via ORAL
  Administered 2014-03-22 – 2014-03-26 (×7): 1 via ORAL
  Administered 2014-03-26: 2 via ORAL
  Administered 2014-03-27: 1 via ORAL
  Administered 2014-03-28: 2 via ORAL
  Administered 2014-03-28 (×2): 1 via ORAL
  Administered 2014-03-29 (×2): 2 via ORAL
  Administered 2014-03-29 – 2014-03-30 (×2): 1 via ORAL
  Filled 2014-03-20 (×2): qty 2
  Filled 2014-03-20 (×4): qty 1
  Filled 2014-03-20: qty 2
  Filled 2014-03-20 (×2): qty 1
  Filled 2014-03-20: qty 2
  Filled 2014-03-20 (×2): qty 1
  Filled 2014-03-20 (×3): qty 2
  Filled 2014-03-20: qty 1
  Filled 2014-03-20: qty 2
  Filled 2014-03-20 (×2): qty 1
  Filled 2014-03-20 (×3): qty 2
  Filled 2014-03-20: qty 1

## 2014-03-20 MED ORDER — ONDANSETRON HCL 4 MG PO TABS: 4.0000 mg | ORAL_TABLET | Freq: Four times a day (QID) | ORAL | Status: DC | PRN

## 2014-03-20 MED ORDER — INSULIN GLARGINE 100 UNIT/ML ~~LOC~~ SOLN
20.0000 [IU] | Freq: Every day | SUBCUTANEOUS | Status: DC
  Administered 2014-03-21 – 2014-03-22 (×2): 20 [IU] via SUBCUTANEOUS
  Filled 2014-03-20 (×3): qty 0.2

## 2014-03-20 MED ORDER — FERROUS SULFATE 325 (65 FE) MG PO TABS
325.0000 mg | ORAL_TABLET | Freq: Every day | ORAL | Status: DC
  Administered 2014-03-21 – 2014-03-30 (×10): 325 mg via ORAL
  Filled 2014-03-20 (×12): qty 1

## 2014-03-20 MED ORDER — FUROSEMIDE 40 MG PO TABS
40.0000 mg | ORAL_TABLET | Freq: Two times a day (BID) | ORAL | Status: DC
  Administered 2014-03-20 – 2014-03-30 (×20): 40 mg via ORAL
  Filled 2014-03-20 (×22): qty 1

## 2014-03-20 MED ORDER — SENNA 8.6 MG PO TABS
2.0000 | ORAL_TABLET | Freq: Two times a day (BID) | ORAL | Status: DC
  Administered 2014-03-20 – 2014-03-23 (×4): 17.2 mg via ORAL
  Filled 2014-03-20 (×12): qty 2

## 2014-03-20 MED ORDER — INSULIN ASPART 100 UNIT/ML ~~LOC~~ SOLN
0.0000 [IU] | Freq: Three times a day (TID) | SUBCUTANEOUS | Status: DC
  Administered 2014-03-20: 5 [IU] via SUBCUTANEOUS
  Administered 2014-03-21 (×2): 2 [IU] via SUBCUTANEOUS
  Administered 2014-03-22 (×2): 1 [IU] via SUBCUTANEOUS
  Administered 2014-03-23 – 2014-03-26 (×6): 2 [IU] via SUBCUTANEOUS

## 2014-03-20 MED ORDER — BACLOFEN 10 MG PO TABS
10.0000 mg | ORAL_TABLET | Freq: Three times a day (TID) | ORAL | Status: DC | PRN
  Filled 2014-03-20: qty 1

## 2014-03-20 MED ORDER — DOCUSATE SODIUM 100 MG PO CAPS
100.0000 mg | ORAL_CAPSULE | Freq: Every day | ORAL | Status: DC
Start: 2014-03-21 — End: 2014-03-30
  Administered 2014-03-22 – 2014-03-30 (×4): 100 mg via ORAL
  Filled 2014-03-20 (×11): qty 1

## 2014-03-20 MED ORDER — ONDANSETRON HCL 4 MG/2ML IJ SOLN: 4.0000 mg | Freq: Four times a day (QID) | INTRAMUSCULAR | Status: DC | PRN

## 2014-03-20 MED ORDER — ACETAMINOPHEN 325 MG PO TABS
325.0000 mg | ORAL_TABLET | ORAL | Status: DC | PRN
  Administered 2014-03-26 – 2014-03-30 (×3): 650 mg via ORAL
  Filled 2014-03-20 (×3): qty 2

## 2014-03-20 MED ORDER — AMLODIPINE BESYLATE 10 MG PO TABS
10.0000 mg | ORAL_TABLET | Freq: Every day | ORAL | Status: DC
  Administered 2014-03-21 – 2014-03-30 (×10): 10 mg via ORAL
  Filled 2014-03-20 (×11): qty 1

## 2014-03-20 MED ORDER — ADULT MULTIVITAMIN W/MINERALS CH
1.0000 | ORAL_TABLET | Freq: Every day | ORAL | Status: DC
  Administered 2014-03-21 – 2014-03-30 (×10): 1 via ORAL
  Filled 2014-03-20 (×11): qty 1

## 2014-03-20 MED ORDER — CALCITRIOL 0.5 MCG PO CAPS
0.5000 ug | ORAL_CAPSULE | Freq: Every day | ORAL | Status: DC
  Administered 2014-03-21 – 2014-03-30 (×10): 0.5 ug via ORAL
  Filled 2014-03-20 (×11): qty 1

## 2014-03-20 NOTE — Progress Notes (Signed)
Rehab admissions - I have not received a response from insurance carrier yet.  I did fax additional therapy updates this am requesting acute inpatient rehab admission.  Call me for questions.  RC:[phone]

## 2014-03-20 NOTE — Progress Notes (Signed)
CSW spoke with Jodell Cipro, RN with inpt rehab who reported that pt will be admitted to rehab unit,authorization received.   Juliann Mule [phone]

## 2014-03-20 NOTE — Progress Notes (Signed)
PMR Admission Coordinator Pre-Admission Assessment  Patient: Carolyn Valenzuela is an 63 y.o., female  MRN: [id number]  DOB: 1951/03/08  Height: 5' (152.4 cm)  Weight: 54.432 kg (120 lb)  Insurance Information  HMO: PPO: Yes PCP: IPA: 80/20: OTHER: Group # [id number]  PRIMARY: BCBS State health plan Policy#: [id number] Subscriber: [place] Name: Malachy Mood Phone#: [phone] Fax#: [phone]  Pre-Cert#: [id number] precert X 14 days Employer: [employer]  Benefits: Phone #: [phone] Name: Scot Dock. Date: 08/31/13 Deduct: $700 (met all) Out of Pocket Max: $3210 (met all) Life Max: unlimited  CIR: $233 copay per admit and then 80% SNF: 80% with 100 days max  Outpatient: 80% Co-Pay: 20%  Home Health: 80% Co-Pay: 20%  DME: 80% Co-Pay: 20%  Providers: in network  Emergency Contact Information  Contact Information    Name  Relation  Home  Work  [place]  Sister  [phone]   [phone]    Royston Cowper  [phone]      Melady, Chow    [phone]      Current Medical History  Patient Admitting Diagnosis: R BKA  History of Present Illness: A 63 y.o. right-handed female with history of poorly controlled diabetes mellitus with peripheral neuropathy, chronic anemia requiring multiple outpatient iron transfusions, chronic renal insufficiency with baseline creatinine 6.42 currently not on dialysis followed by renal services. Patient was independent with a cane prior to admission living alone with good supportive of family. Presented 03/15/2014 with progressive gangrenous ischemic changes right lower extremity. Wound was not felt to be salvageable and underwent right below-knee dictation 03/16/2014 per Dr. Doran Durand. Postoperative pain management. Acute blood loss anemia 5.8 and transfused. Renal function monitored closely with creatinine 5.20 on 03/18/2014 and patient had been followed as outpatient by renal services Dr. Moshe Cipro.  Physical therapy completed and ongoing with recommendations for physical medicine rehabilitation consult.  Past Medical History  Past Medical History   Diagnosis  Date   .  DIABETES MELLITUS, UNCONTROLLED  05/21/2009   .  HYPERLIPIDEMIA  03/30/2007   .  ANEMIA-IRON DEFICIENCY  01/25/2008   .  HYPERTENSION  01/25/2008   .  SINUSITIS- ACUTE-NOS  01/25/2008   .  SECONDARY HYPERPARATHYROIDISM  05/21/2009   .  RENAL INSUFFICIENCY  02/01/2008   .  CERVICAL RADICULOPATHY, LEFT  01/25/2008   .  BACK PAIN  05/02/2009   .  NUMBNESS  05/21/2009   .  Allergic rhinitis, cause unspecified  12/08/2010   .  Foot ulcer      right lateral malleolus   .  Critical lower limb ischemia     Family History  family history includes Cancer in her mother.  Prior Rehab/Hospitalizations: None  Current Medications  Current facility-administered medications:amLODipine (NORVASC) tablet 10 mg, 10 mg, Oral, Daily, Thurnell Lose, MD, 10 mg at 03/20/14 1051; aspirin EC tablet 81 mg, 81 mg, Oral, Daily, [name], MD, 81 mg at 03/20/14 1051; baclofen (LIORESAL) tablet 10 mg, 10 mg, Oral, Q8H PRN, Thurnell Lose, MD; calcitRIOL (ROCALTROL) capsule 0.5 mcg, 0.5 mcg, Oral, Daily, [name], MD, 0.5 mcg at 03/20/14 1053  carvedilol (COREG) tablet 6.25 mg, 6.25 mg, Oral, BID WC, Thurnell Lose, MD, 6.25 mg at 03/20/14 9381; diphenhydrAMINE (BENADRYL) injection 25 mg, 25 mg, Intravenous, Q6H PRN, Thurnell Lose, MD; docusate sodium (COLACE) capsule 100 mg, 100 mg, Oral, Daily, Thurnell Lose, MD; ferrous sulfate tablet 325 mg, 325 mg, Oral, Q  breakfast, [name], MD, 325 mg at 03/20/14 7209  furosemide (LASIX) tablet 40 mg, 40 mg, Oral, BID, Thurnell Lose, MD, 40 mg at 03/20/14 4709; hydrALAZINE (APRESOLINE) tablet 10 mg, 10 mg, Oral, Q6H PRN, Ritta Slot, NP, 10 mg at 03/20/14 0257; HYDROcodone-acetaminophen (NORCO/VICODIN) 5-325 MG per tablet 1-2 tablet, 1-2 tablet, Oral, Q4H PRN, Thurnell Lose, MD, 2 tablet at 03/20/14  0852  insulin aspart (novoLOG) injection 0-9 Units, 0-9 Units, Subcutaneous, TID WC, Thurnell Lose, MD, 1 Units at 03/19/14 1201; insulin glargine (LANTUS) injection 20 Units, 20 Units, Subcutaneous, Daily, Thurnell Lose, MD, 20 Units at 03/20/14 1053; morphine 2 MG/ML injection 1 mg, 1 mg, Intravenous, Q2H PRN, [name], MD, 1 mg at 03/15/14 1934  multivitamin with minerals tablet 1 tablet, 1 tablet, Oral, Daily, [name], MD, 1 tablet at 03/20/14 1051; ondansetron [facility]) injection 4 mg, 4 mg, Intravenous, Q6H PRN, [name], MD, 4 mg at 03/17/14 0703; senna (SENOKOT) tablet 17.2 mg, 2 tablet, Oral, BID, [name], MD, 17.2 mg at 03/17/14 6283; simvastatin (ZOCOR) tablet 20 mg, 20 mg, Oral, QHS, [name], MD, 20 mg at 03/19/14 2134  Patients Current Diet: Carb Control  Precautions / Restrictions  Precautions  Precautions: Fall  Restrictions  Weight Bearing Restrictions: Yes  RLE Weight Bearing: Non weight bearing  Prior Activity Level  Community (5-7x/wk): Went out daily. 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 Was driving PTA.  Home Assistive Devices / Equipment  Home Assistive Devices/Equipment: None  Home Equipment: Cane - single point  Prior Functional Level  Prior Function  Level of Independence: Independent  Current Functional Level  Cognition  Overall Cognitive Status: Within Functional Limits for tasks assessed  Orientation Level: Oriented X4   Extremity Assessment  (includes Sensation/Coordination)  Upper Extremity Assessment: Defer to OT evaluation  Lower Extremity Assessment: RLE deficits/detail;LLE deficits/detail  RLE Deficits / Details: Decreased strength and ROM at knee as expected post op amputation  LLE Deficits / Details: Reports intermittent numbness of left foot   ADLs  Overall ADL's : Needs assistance/impaired  Eating/Feeding: Set up;Sitting  Grooming: Wash/dry hands;Min guard;Standing (at sink)  Upper Body Bathing: Set  up;Sitting  Lower Body Bathing: Maximal assistance;Sitting/lateral leans  Upper Body Dressing : Set up;Sitting  Lower Body Dressing: Maximal assistance;Sitting/lateral leans  Toilet Transfer: Minimal assistance;Ambulation;RW;Comfort height toilet;Grab bars  Toileting- Clothing Manipulation and Hygiene: Min guard;Sitting/lateral lean   Mobility  Overal bed mobility: Needs Assistance  Bed Mobility: Sit to Supine  Supine to sit: Min guard  Sit to supine: Min guard  General bed mobility comments: Pt up in recliner upon arrival   Transfers  Overall transfer level: Needs assistance  Equipment used: Rolling walker (2 wheeled)  Transfers: Sit to/from Stand  Sit to Stand: Min assist  Stand pivot transfers: Mod assist  Squat pivot transfers: Mod assist  General transfer comment: Min assist to steady RW but demonstrates good stability upon standing. VC for hand placement.   Ambulation / Gait / Stairs / Wheelchair Mobility  Ambulation/Gait  Ambulation/Gait assistance: Fish farm manager (Feet): 20 Feet (x2)  Assistive device: Rolling walker (2 wheeled)  Gait Pattern/deviations: ("hop-to" pattern)  Gait velocity interpretation: Below normal speed for age/gender  General Gait Details: Min assist for balance and safety (pt reports she fell this weekend due to Rt hand slipping off of walker). Reviewed education for safe DME use and hand placement. VC for walker placement. Required seated rest  break between bouts due to fatigue.   Posture / Balance  Overall balance assessment: Needs assistance  Sitting-balance support: No upper extremity supported;Feet supported  Sitting balance-Leahy Scale: Fair  Standing balance support: Bilateral upper extremity supported  Standing balance-Leahy Scale: Poor   Special needs/care consideration  BiPAP/CPAP No  CPM No  Continuous Drip IV No  Dialysis No  Life Vest No  Oxygen No  Special Bed No  Trach Size No  Wound Vac (area) No  Skin Has ace wrap  to new R BKA stump incision L  Bowel mgmt: Las BM 03/19/14  Bladder mgmt: Voiding in bathroom with assistance  Diabetic mgmt Yes, on oral medications and on insulin at home   Previous Home Environment  Living Arrangements: Other relatives (sister)  Available Help at Discharge: Family (sister)  Type of Home: House  Home Layout: One level  Home Access: Stairs to enter  Entrance Stairs-Rails: None  Entrance Stairs-Number of Steps: 2  Home Care Services: No  Additional Comments: Was living at home pta by herself and plans to go stay with her sister after a rehab stay  Discharge Living Setting  Plans for Discharge Living Setting: House;Lives with (comment) (Plans to go home with sister, [name].)  Type of Home at Discharge: House  Discharge Home Layout: One level  Discharge Home Access: Stairs to enter  Entrance Stairs-Number of Steps: 2 steps  Does the patient have any problems obtaining your medications?: No  Social/Family/Support Systems  Patient Roles: Other (Comment) (Has 2 sisters who assist.)  Contact Information: [name] - sister  Anticipated Caregiver: [name]  Anticipated Caregiver's Contact Information: [name] - sister (h) [phone] (c) [phone]  Ability/Limitations of Caregiver: Sister can assist. Patient plans to go home with sister  Caregiver Availability: 24/7  Discharge Plan Discussed with Primary Caregiver: Yes  Is Caregiver In Agreement with Plan?: Yes  Does Caregiver/Family have Issues with Lodging/Transportation while Pt is in Rehab?: No  Goals/Additional Needs  Patient/Family Goal for Rehab: PT/OT mod I goals  Expected length of stay: 10-13 days  Cultural Considerations: Holiness. Attends [employer] with her sister. Sister [name] is a Naval architect.  Dietary Needs: Carb mod med cal diabetic diet, thin liquids, 1200 ml restriction of fluids  Equipment Needs: TBD  Pt/Family Agrees to Admission and willing to participate: Yes  Program  Orientation Provided & Reviewed with Pt/Caregiver Including Roles & Responsibilities: Yes  Decrease burden of Care through IP rehab admission: N/A  Possible need for SNF placement upon discharge: Not anticipated  Patient Condition: This patient's condition remains as documented in the consult dated 03/19/14, in which the Rehabilitation Physician determined and documented that the patient's condition is appropriate for intensive rehabilitative care in an inpatient rehabilitation facility. Will admit to inpatient rehab today.  Preadmission Screen Completed By: Retta Diones, 03/20/2014 11:16 AM  ______________________________________________________________________  Discussed status with Dr. Naaman Plummer on 03/20/14 at 1131 and received telephone approval for admission today.  Admission Coordinator: Retta Diones, time1131/Date07/21/15  Cosigned by: Meredith Staggers, MD [03/20/2014 11:48 AM]

## 2014-03-20 NOTE — Progress Notes (Signed)
Rehab admissions - I have approval from [facility] for acute inpatient rehab admission.  Bed available, patient agreeable, and will admit to inpatient rehab today.  Call me for questions.  RC:[id number]

## 2014-03-20 NOTE — PMR Pre-admission (Signed)
PMR Admission Coordinator Pre-Admission Assessment  Patient: Carolyn Valenzuela is an 63 y.o., female MRN: [id number] DOB: 1951-01-01 Height: 5' (152.4 cm) Weight: 54.432 kg (120 lb)              Insurance Information HMO:      PPO: Yes     PCP:       IPA:       80/20:       OTHER:  Group # [id number] PRIMARY: BCBS State health plan      Policy#: [id number]      Subscriber: [place] Name: Malachy Mood      Phone#: [phone]     Fax#: [phone] Pre-Cert#: [id number] precert X 14 days      Employer: [employer] Benefits:  Phone #: [phone]     Name: Scot Dock. Date: 08/31/13     Deduct: $700 (met all)      Out of Pocket Max: $3210 (met all)      Life Max: unlimited CIR: $233 copay per admit and then 80%      SNF: 80% with 100 days max Outpatient: 80%     Co-Pay: 20% Home Health: 80%      Co-Pay: 20% DME: 80%     Co-Pay: 20% Providers: in network   Emergency Contact Information Contact Information   Name Relation Home Work [place] Sister [phone]  [phone]   Royston Cowper [phone]     Orli, Degrave   [phone]     Current Medical History  Patient Admitting Diagnosis:  R BKA  History of Present Illness: A 63 y.o. right-handed female with history of poorly controlled diabetes mellitus with peripheral neuropathy, chronic anemia requiring multiple outpatient iron transfusions, chronic renal insufficiency with baseline creatinine 6.42 currently not on dialysis followed by renal services. Patient was independent with a cane prior to admission living alone with good supportive of family. Presented 03/15/2014 with progressive gangrenous ischemic changes right lower extremity. Wound was not felt to be salvageable and underwent right below-knee dictation 03/16/2014 per Dr. Doran Durand. Postoperative pain management. Acute blood loss anemia 5.8 and transfused. Renal function monitored closely with creatinine 5.20 on 03/18/2014 and  patient had been followed as outpatient by renal services Dr. Moshe Cipro. Physical therapy completed and ongoing with recommendations for physical medicine rehabilitation consult.     Past Medical History  Past Medical History  Diagnosis Date  . DIABETES MELLITUS, UNCONTROLLED 05/21/2009  . HYPERLIPIDEMIA 03/30/2007  . ANEMIA-IRON DEFICIENCY 01/25/2008  . HYPERTENSION 01/25/2008  . SINUSITIS- ACUTE-NOS 01/25/2008  . SECONDARY HYPERPARATHYROIDISM 05/21/2009  . RENAL INSUFFICIENCY 02/01/2008  . CERVICAL RADICULOPATHY, LEFT 01/25/2008  . BACK PAIN 05/02/2009  . NUMBNESS 05/21/2009  . Allergic rhinitis, cause unspecified 12/08/2010  . Foot ulcer     right lateral malleolus  . Critical lower limb ischemia     Family History  family history includes Cancer in her mother.  Prior Rehab/Hospitalizations:  None   Current Medications  Current facility-administered medications:amLODipine (NORVASC) tablet 10 mg, 10 mg, Oral, Daily, Thurnell Lose, MD, 10 mg at 03/20/14 1051;  aspirin EC tablet 81 mg, 81 mg, Oral, Daily, [name], MD, 81 mg at 03/20/14 1051;  baclofen (LIORESAL) tablet 10 mg, 10 mg, Oral, Q8H PRN, Thurnell Lose, MD;  calcitRIOL (ROCALTROL) capsule 0.5 mcg, 0.5 mcg, Oral, Daily, [name], MD, 0.5 mcg at 03/20/14 1053 carvedilol (COREG) tablet 6.25 mg, 6.25 mg, Oral, BID WC, Thurnell Lose, MD, 6.25 mg at 03/20/14  2119;  diphenhydrAMINE (BENADRYL) injection 25 mg, 25 mg, Intravenous, Q6H PRN, Thurnell Lose, MD;  docusate sodium (COLACE) capsule 100 mg, 100 mg, Oral, Daily, Thurnell Lose, MD;  ferrous sulfate tablet 325 mg, 325 mg, Oral, Q breakfast, [name], MD, 325 mg at 03/20/14 4174 furosemide (LASIX) tablet 40 mg, 40 mg, Oral, BID, Thurnell Lose, MD, 40 mg at 03/20/14 0814;  hydrALAZINE (APRESOLINE) tablet 10 mg, 10 mg, Oral, Q6H PRN, Ritta Slot, NP, 10 mg at 03/20/14 0257;  HYDROcodone-acetaminophen (NORCO/VICODIN) 5-325 MG per tablet 1-2 tablet, 1-2 tablet, Oral,  Q4H PRN, Thurnell Lose, MD, 2 tablet at 03/20/14 0852 insulin aspart (novoLOG) injection 0-9 Units, 0-9 Units, Subcutaneous, TID WC, Thurnell Lose, MD, 1 Units at 03/19/14 1201;  insulin glargine (LANTUS) injection 20 Units, 20 Units, Subcutaneous, Daily, Thurnell Lose, MD, 20 Units at 03/20/14 1053;  morphine 2 MG/ML injection 1 mg, 1 mg, Intravenous, Q2H PRN, [name], MD, 1 mg at 03/15/14 1934 multivitamin with minerals tablet 1 tablet, 1 tablet, Oral, Daily, [name], MD, 1 tablet at 03/20/14 1051;  ondansetron [facility]) injection 4 mg, 4 mg, Intravenous, Q6H PRN, [name], MD, 4 mg at 03/17/14 0703;  senna (SENOKOT) tablet 17.2 mg, 2 tablet, Oral, BID, [name], MD, 17.2 mg at 03/17/14 4818;  simvastatin (ZOCOR) tablet 20 mg, 20 mg, Oral, QHS, [name], MD, 20 mg at 03/19/14 2134  Patients Current Diet: Carb Control  Precautions / Restrictions Precautions Precautions: Fall Restrictions Weight Bearing Restrictions: Yes RLE Weight Bearing: Non weight bearing   Prior Activity Level Community (5-7x/wk): Went out daily.  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  Was driving PTA.  Home Assistive Devices / Equipment Home Assistive Devices/Equipment: None Home Equipment: Cane - single point  Prior Functional Level Prior Function Level of Independence: Independent  Current Functional Level Cognition  Overall Cognitive Status: Within Functional Limits for tasks assessed Orientation Level: Oriented X4    Extremity Assessment (includes Sensation/Coordination)  Upper Extremity Assessment: Defer to OT evaluation  Lower Extremity Assessment: RLE deficits/detail;LLE deficits/detail  RLE Deficits / Details: Decreased strength and ROM at knee as expected post op amputation  LLE Deficits / Details: Reports intermittent numbness of left foot   ADLs  Overall ADL's : Needs assistance/impaired Eating/Feeding: Set up;Sitting Grooming: Wash/dry  hands;Min guard;Standing (at sink) Upper Body Bathing: Set up;Sitting Lower Body Bathing: Maximal assistance;Sitting/lateral leans Upper Body Dressing : Set up;Sitting Lower Body Dressing: Maximal assistance;Sitting/lateral leans Toilet Transfer: Minimal assistance;Ambulation;RW;Comfort height toilet;Grab bars Toileting- Clothing Manipulation and Hygiene: Min guard;Sitting/lateral lean    Mobility  Overal bed mobility: Needs Assistance Bed Mobility: Sit to Supine Supine to sit: Min guard Sit to supine: Min guard General bed mobility comments: Pt up in recliner upon arrival    Transfers  Overall transfer level: Needs assistance Equipment used: Rolling walker (2 wheeled) Transfers: Sit to/from Stand Sit to Stand: Min assist Stand pivot transfers: Mod assist Squat pivot transfers: Mod assist General transfer comment: Min assist to steady RW but demonstrates good stability upon standing. VC for hand placement.    Ambulation / Gait / Stairs / Wheelchair Mobility  Ambulation/Gait Ambulation/Gait assistance: Museum/gallery curator (Feet): 20 Feet (x2) Assistive device: Rolling walker (2 wheeled) Gait Pattern/deviations:  ("hop-to" pattern) Gait velocity interpretation: Below normal speed for age/gender General Gait Details: Min assist for balance and safety (pt reports she fell this weekend due to Rt hand slipping off of walker).  Reviewed education for safe DME use and hand placement. VC for walker placement. Required seated rest break between bouts due to fatigue.    Posture / Balance Overall balance assessment: Needs assistance  Sitting-balance support: No upper extremity supported;Feet supported  Sitting balance-Leahy Scale: Fair  Standing balance support: Bilateral upper extremity supported  Standing balance-Leahy Scale: Poor     Special needs/care consideration BiPAP/CPAP No CPM No Continuous Drip IV No Dialysis No       Life Vest No Oxygen No Special Bed No Trach  Size No Wound Vac (area) No      Skin Has ace wrap to new R BKA stump incision                              L Bowel mgmt: Las BM 03/19/14 Bladder mgmt: Voiding in bathroom with assistance Diabetic mgmt Yes, on oral medications and on insulin at home    Previous Home Environment Living Arrangements: Other relatives (sister) Available Help at Discharge: Family (sister) Type of Home: House Home Layout: One level Home Access: Stairs to enter Entrance Stairs-Rails: None Entrance Stairs-Number of Steps: 2 Home Care Services: No Additional Comments: Was living at home pta by herself and plans to go stay with her sister after a rehab stay  Discharge Living Setting Plans for Discharge Living Setting: House;Lives with (comment) (Plans to go home with sister, [name].) Type of Home at Discharge: House Discharge Home Layout: One level Discharge Home Access: Stairs to enter Entrance Stairs-Number of Steps: 2 steps Does the patient have any problems obtaining your medications?: No  Social/Family/Support Systems Patient Roles: Other (Comment) (Has 2 sisters who assist.) Contact Information: [name] - sister Anticipated Caregiver: [name] Anticipated Caregiver's Contact Information: [name] - sister (h) [phone] (c) [phone] Ability/Limitations of Caregiver: Sister can assist.  Patient plans to go home with sister Caregiver Availability: 24/7 Discharge Plan Discussed with Primary Caregiver: Yes Is Caregiver In Agreement with Plan?: Yes Does Caregiver/Family have Issues with Lodging/Transportation while Pt is in Rehab?: No  Goals/Additional Needs Patient/Family Goal for Rehab: PT/OT mod I goals Expected length of stay: 10-13 days Cultural Considerations: Holiness.  Attends [employer] with her sister.  Sister [name] is a Naval architect. Dietary Needs: Carb mod med cal diabetic diet, thin liquids, 1200 ml restriction of fluids Equipment Needs: TBD Pt/Family Agrees to  Admission and willing to participate: Yes Program Orientation Provided & Reviewed with Pt/Caregiver Including Roles  & Responsibilities: Yes   Decrease burden of Care through IP rehab admission: N/A  Possible need for SNF placement upon discharge: Not anticipated   Patient Condition: This patient's condition remains as documented in the consult dated 03/19/14, in which the Rehabilitation Physician determined and documented that the patient's condition is appropriate for intensive rehabilitative care in an inpatient rehabilitation facility. Will admit to inpatient rehab today.  Preadmission Screen Completed By:  Retta Diones, 03/20/2014 11:16 AM ______________________________________________________________________   Discussed status with Dr. Naaman Plummer on 03/20/14 at 1131 and received telephone approval for admission today.  Admission Coordinator:  Retta Diones, time1131/Date07/21/15

## 2014-03-20 NOTE — Progress Notes (Signed)
Physical Medicine and Rehabilitation Consult  Reason for Consult: Right BKA  Referring Physician: Dr. Doran Durand  HPI: Carolyn Valenzuela is a 63 y.o. right-handed female with history of poorly controlled diabetes mellitus with peripheral neuropathy, chronic anemia requiring multiple outpatient iron transfusions, chronic renal insufficiency with baseline creatinine 6.42 currently not on dialysis followed by renal services. Patient was independent with a cane prior to admission living alone with good supportive of family. Presented 03/15/2014 with progressive gangrenous ischemic changes right lower extremity. Wound was not felt to be salvageable and underwent right below-knee dictation 03/16/2014 per Dr. Doran Durand. Postoperative pain management. Acute blood loss anemia 5.8 and transfused. Renal function monitored closely with creatinine 5.20 on 03/18/2014 and patient had been followed as outpatient by renal services Dr. Moshe Cipro. Physical therapy completed and ongoing with recommendations for physical medicine rehabilitation consult.  Review of Systems  Constitutional: Positive for malaise/fatigue.  Gastrointestinal: Positive for constipation.  Musculoskeletal: Positive for back pain.  All other systems reviewed and are negative.   Past Medical History   Diagnosis  Date   .  DIABETES MELLITUS, UNCONTROLLED  05/21/2009   .  HYPERLIPIDEMIA  03/30/2007   .  ANEMIA-IRON DEFICIENCY  01/25/2008   .  HYPERTENSION  01/25/2008   .  SINUSITIS- ACUTE-NOS  01/25/2008   .  SECONDARY HYPERPARATHYROIDISM  05/21/2009   .  RENAL INSUFFICIENCY  02/01/2008   .  CERVICAL RADICULOPATHY, LEFT  01/25/2008   .  BACK PAIN  05/02/2009   .  NUMBNESS  05/21/2009   .  Allergic rhinitis, cause unspecified  12/08/2010   .  Foot ulcer      right lateral malleolus   .  Critical lower limb ischemia     Past Surgical History   Procedure  Laterality  Date   .  Av fistula placement  Left    .  Eye surgery  Bilateral      cataracts removed,  left eye still has some oil in it.   .  Colonoscopy     .  Amputation  Right  03/15/2014     Procedure: RIGHT LEG BELOW KNEE AMPUTATION ; Surgeon: [name], MD; Location: [place]; Service: Orthopedics; Laterality: Right;    Family History   Problem  Relation  Age of Onset   .  Cancer  Mother      Pancreatic    Social History: reports that she has never smoked. She has never used smokeless tobacco. She reports that she does not drink alcohol or use illicit drugs.  Allergies:  Allergies   Allergen  Reactions   .  Oxycodone  Itching    Medications Prior to Admission   Medication  Sig  Dispense  Refill   .  amLODipine (NORVASC) 2.5 MG tablet  Take 2.5 mg by mouth 2 (two) times daily.     [name]  aspirin 81 MG EC tablet  Take 81 mg by mouth daily.     .  calcitRIOL (ROCALTROL) 0.5 MCG capsule  Take 0.5 mcg by mouth daily.     .  ferrous sulfate (FERROUSUL) 325 (65 FE) MG tablet  Take 325 mg by mouth daily with breakfast.     .  furosemide (LASIX) 40 MG tablet  Take 40 mg by mouth 2 (two) times daily.     .  Multiple Vitamin (MULTIVITAMIN WITH MINERALS) TABS tablet  Take 1 tablet by mouth daily.     [name]  oxyCODONE-acetaminophen (PERCOCET) 10-325 MG per tablet  Take  1 tablet by mouth every 4 (four) hours as needed for pain.     .  simvastatin (ZOCOR) 20 MG tablet  Take 1 tablet (20 mg total) by mouth at bedtime.  90 tablet  3   .  sitaGLIPtin (JANUVIA) 100 MG tablet  Take 100 mg by mouth daily.     .  insulin glargine (LANTUS) 100 unit/mL SOPN  Inject 55 Units into the skin at bedtime.      Home:  Home Living  Family/patient expects to be discharged to:: Inpatient rehab  Living Arrangements: Other relatives (sister)  Available Help at Discharge: Family (sister)  Type of Home: House  Home Access: Stairs to enter  Technical brewer of Steps: 2  Entrance Stairs-Rails: None  Home Layout: One level  Home Equipment: Cane - single point  Additional Comments: Was living at home pta by herself  and plans to go stay with her sister after a rehab stay  Functional History:  Prior Function  Level of Independence: Independent  Functional Status:  Mobility:  Bed Mobility  Overal bed mobility: Needs Assistance  Bed Mobility: Sit to Supine  Supine to sit: Mod assist;HOB elevated  Sit to supine: Min guard  General bed mobility comments: Min guard with sit>supine into bed. VC for technique and requires extra time but no physical assist to enter bed.  Transfers  Overall transfer level: Needs assistance  Equipment used: Rolling walker (2 wheeled)  Transfers: Sit to/from [facility]  Sit to Stand: Min assist  Stand pivot transfers: Mod assist  Squat pivot transfers: Mod assist  General transfer comment: Min assist for boost from recliner and onto bed with VC for hand placement. Mod assist with pivot for balance with posterior lean and and to control RW. Educated on safe DME use with rolling walker.  Ambulation/Gait  Ambulation/Gait assistance: Mod assist  Ambulation Distance (Feet): 2 Feet  Assistive device: Rolling walker (2 wheeled)  Gait Pattern/deviations: ("hop to" technique)  Gait velocity interpretation: Below normal speed for age/gender  General Gait Details: Able to take step forward and backwards with mod assist for balance and walker positioning. VC to increase left foot clearance but only a modest improvement due to weakness.   ADL:  ADL  Overall ADL's : Needs assistance/impaired  Eating/Feeding: Set up;Sitting  Grooming: Set up;Sitting  Upper Body Bathing: Set up;Sitting  Lower Body Bathing: Maximal assistance;Sitting/lateral leans  Upper Body Dressing : Set up;Sitting  Lower Body Dressing: Maximal assistance;Sitting/lateral leans  Toilet Transfer: Moderate assistance;Squat-pivot (Bed>recliner going to her left (non BKA side))  Toileting- Clothing Manipulation and Hygiene: Total assistance;Sit to/from stand (sit to partial stand)  Cognition:   Cognition  Overall Cognitive Status: Within Functional Limits for tasks assessed  Orientation Level: Oriented X4  Cognition  Arousal/Alertness: Awake/alert  Behavior During Therapy: WFL for tasks assessed/performed  Overall Cognitive Status: Within Functional Limits for tasks assessed  Blood pressure 180/73, pulse 86, temperature 98.8 F (37.1 C), temperature source Oral, resp. rate 18, height 5' (1.524 m), weight 54.432 kg (120 lb), SpO2 100.00%.  Physical Exam  Vitals reviewed.  Constitutional: She is oriented to person, place, and time.  HENT:  Head: Normocephalic.  Eyes: EOM are normal.  Neck: Normal range of motion. Neck supple. No thyromegaly present.  Cardiovascular: Normal rate and regular rhythm.  Respiratory: Effort normal and breath sounds normal. No respiratory distress.  GI: Soft. Bowel sounds are normal. She exhibits no distension.  Musculoskeletal:  Right BK wound is dressed. Has good  knee ROM.  Neurological: She is alert and oriented to person, place, and time.  UE 5/5. LLE appears intact and 4- HF to 4/5 at ankle. Able to lift RLE agst gravity.  Skin:  BKA site is dressed and appropriately tender  Psychiatric: She has a normal mood and affect. Her behavior is normal. Judgment and thought content normal.   Results for orders placed during the hospital encounter of 03/15/14 (from the past 24 hour(s))   GLUCOSE, CAPILLARY Status: Abnormal    Collection Time    03/18/14 6:41 AM   Result  Value  Ref Range    Glucose-Capillary  58 (*)  70 - 99 mg/dL   GLUCOSE, CAPILLARY Status: None    Collection Time    03/18/14 7:12 AM   Result  Value  Ref Range    Glucose-Capillary  84  70 - 99 mg/dL   GLUCOSE, CAPILLARY Status: Abnormal    Collection Time    03/18/14 9:26 AM   Result  Value  Ref Range    Glucose-Capillary  217 (*)  70 - 99 mg/dL   GLUCOSE, CAPILLARY Status: Abnormal    Collection Time    03/18/14 11:09 AM   Result  Value  Ref Range    Glucose-Capillary   205 (*)  70 - 99 mg/dL   GLUCOSE, CAPILLARY Status: Abnormal    Collection Time    03/18/14 4:26 PM   Result  Value  Ref Range    Glucose-Capillary  59 (*)  70 - 99 mg/dL    Comment 1  Notify RN    GLUCOSE, CAPILLARY Status: None    Collection Time    03/18/14 5:19 PM   Result  Value  Ref Range    Glucose-Capillary  76  70 - 99 mg/dL   GLUCOSE, CAPILLARY Status: Abnormal    Collection Time    03/18/14 8:16 PM   Result  Value  Ref Range    Glucose-Capillary  142 (*)  70 - 99 mg/dL   GLUCOSE, CAPILLARY Status: None    Collection Time    03/19/14 12:00 AM   Result  Value  Ref Range    Glucose-Capillary  73  70 - 99 mg/dL   GLUCOSE, CAPILLARY Status: Abnormal    Collection Time    03/19/14 4:43 AM   Result  Value  Ref Range    Glucose-Capillary  42 (*)  70 - 99 mg/dL   GLUCOSE, CAPILLARY Status: None    Collection Time    03/19/14 5:07 AM   Result  Value  Ref Range    Glucose-Capillary  95  70 - 99 mg/dL    Dg Abd Portable 1v  03/17/2014 CLINICAL DATA: Generalized abdominal pain. EXAM: PORTABLE ABDOMEN - 1 VIEW COMPARISON: CT abdomen and pelvis 07/09/2011. FINDINGS: Bowel gas pattern unremarkable without evidence of obstruction or significant ileus. Expected stool burden in the colon. Calcified, degenerated uterine fibroids, including a large fibroid approximating 8.5 x 5.5 cm. No visible opaque urinary tract calculi. Degenerative changes involving the lower lumbar spine. IMPRESSION: 1. No acute abdominal abnormality. 2. Calcified, degenerated uterine fibroids. Electronically Signed By: Evangeline Dakin M.D. On: 03/17/2014 09:17   Assessment/Plan:  Diagnosis: right BKA  1. Does the need for close, 24 hr/day medical supervision in concert with the patient's rehab needs make it unreasonable for this patient to be served in a less intensive setting? Yes 2. Co-Morbidities requiring supervision/potential complications: DM, anemia, htn 3. Due to bladder management, bowel management,  safety, skin/wound care, disease management, medication administration, pain management and patient education, does the patient require 24 hr/day rehab nursing? Yes 4. Does the patient require coordinated care of a physician, rehab nurse, PT (1-2 hrs/day, 5 days/week) and OT (1-2 hrs/day, 5 days/week) to address physical and functional deficits in the context of the above medical diagnosis(es)? Yes Addressing deficits in the following areas: balance, endurance, locomotion, strength, transferring, bowel/bladder control, bathing, dressing, feeding, grooming, toileting and psychosocial support 5. Can the patient actively participate in an intensive therapy program of at least 3 hrs of therapy per day at least 5 days per week? Yes 6. The potential for patient to make measurable gains while on inpatient rehab is excellent 7. Anticipated functional outcomes upon discharge from inpatient rehab are modified independent with PT, modified independent with OT, n/a with SLP. 8. Estimated rehab length of stay to reach the above functional goals is: 10-13 days 9. Does the patient have adequate social supports to accommodate these discharge functional goals? Yes 10. Anticipated D/C setting: Home 11. Anticipated post D/C treatments: HH therapy and Outpatient therapy 12. Overall Rehab/Functional Prognosis: excellent RECOMMENDATIONS:  This patient's condition is appropriate for continued rehabilitative care in the following setting: CIR  Patient has agreed to participate in recommended program. Yes  Note that insurance prior authorization may be required for reimbursement for recommended care.  Comment: Rehab Admissions Coordinator to follow up. Pt is an excellent candidate. She is quite motivated and will stay with sister upon discharge  Thanks,  Meredith Staggers, MD, [facility]  03/19/2014  Revision History...      Date/Time User Action    03/19/2014 9:35 AM Meredith Staggers, MD Sign    03/19/2014 6:30 AM Cathlyn Parsons, PA-C [place]   View Details Report    Routing History.Marland KitchenMarland Kitchen

## 2014-03-20 NOTE — Progress Notes (Signed)
Physical Therapy Treatment Patient Details Name: Carolyn Valenzuela MRN: [id number] DOB: [birth year] Today's Date: 03/20/2014    History of Present Illness 63 y.o. 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 PMHx:DM, HTN, renal disease, LUE with fistula, back pain    PT Comments    Patient continues to progress towards physical therapy goals, ambulating up to 30 feet this afternoon with 2 short standing rest breaks during bout. Tolerating transfer training and therapeutic exercises well. Patient will continue to benefit from skilled physical therapy services to further improve independence with functional mobility.   Follow Up Recommendations  CIR     Equipment Recommendations  Rolling walker with 5" wheels;3in1 (PT);Wheelchair (measurements PT);Wheelchair cushion (measurements PT)    Recommendations for Other Services Rehab consult     Precautions / Restrictions Precautions Precautions: Fall Required Braces or Orthoses: Knee Immobilizer - Right Knee Immobilizer - Right: On at all times Restrictions Weight Bearing Restrictions: Yes RLE Weight Bearing: Non weight bearing    Mobility  Bed Mobility                  Transfers Overall transfer level: Needs assistance Equipment used: Rolling walker (2 wheeled) Transfers: Sit to/from Stand Sit to Stand: Min guard         General transfer comment: Min guard for safety with VC for hand placement. Requires extra time. Performed from [facility] and recliner x3.  Ambulation/Gait Ambulation/Gait assistance: Min guard Ambulation Distance (Feet): 30 Feet Assistive device: Rolling walker (2 wheeled) Gait Pattern/deviations:  ("hop-to")   Gait velocity interpretation: Below normal speed for age/gender General Gait Details: Min guard for safety with VC for walker placement to allow smooth step with LLE versus  a hard hop. Pt required 2 standing rest breaks during ambulatory bout.   Stairs            Wheelchair  Mobility    Modified Rankin (Stroke Patients Only)       Balance                                    Cognition Arousal/Alertness: Awake/alert Behavior During Therapy: WFL for tasks assessed/performed Overall Cognitive Status: Within Functional Limits for tasks assessed                      Exercises Amputee Exercises Hip Extension: AROM;Right;10 reps;Standing Hip Flexion/Marching: AROM;Right;Standing;10 reps    General Comments        Pertinent Vitals/Pain Pt with no complaints of pain during therapy session Patient repositioned in chair for comfort.     Home Living                      Prior Function            PT Goals (current goals can now be found in the care plan section) Acute Rehab PT Goals PT Goal Formulation: With patient Time For Goal Achievement: 03/23/14 Potential to Achieve Goals: Good Progress towards PT goals: Progressing toward goals    Frequency  Min 5X/week    PT Plan Current plan remains appropriate    Co-evaluation             End of Session   Activity Tolerance: Patient tolerated treatment well Patient left: with call bell/phone within reach;with family/visitor present;in chair     Time: [id number] PT Time Calculation (min): 28 min  Charges:  $Gait  Training: 8-22 mins $Therapeutic Activity: 8-22 mins                    G Codes:      Elayne Snare, [place]  Ellouise Newer 03/20/2014, 1:57 PM

## 2014-03-20 NOTE — Interval H&P Note (Signed)
Carolyn Valenzuela was admitted today to Inpatient Rehabilitation with the diagnosis of right BKA.  The patient's history has been reviewed, patient examined, and there is no change in status.  Patient continues to be appropriate for intensive inpatient rehabilitation.  I have reviewed the patient's chart and labs.  Questions were answered to the patient's satisfaction.  SWARTZ,ZACHARY T 03/20/2014, 4:45 PM

## 2014-03-20 NOTE — H&P (View-Only) (Signed)
Physical Medicine and Rehabilitation Admission H&P    No chief complaint on file. :  Chief complaint: Leg pain  HPI: Carolyn Valenzuela is a 63 y.o. right-handed female with history of poorly controlled diabetes mellitus with peripheral neuropathy, chronic anemia requiring multiple outpatient iron transfusions, chronic renal insufficiency with baseline creatinine 6.42 currently not on dialysis followed by renal services. Patient was independent with a cane prior to admission living alone with good supportive of family. Presented 03/15/2014 with progressive gangrenous ischemic changes right lower extremity. Wound was not felt to be salvageable and underwent right below-knee amputation 03/16/2014 per Dr. Doran Durand. Postoperative pain management. Acute blood loss anemia 5.8 and transfused with  latest hemoglobin 8.6. Renal function monitored closely with creatinine 5.20 on 03/18/2014 and patient had been followed as outpatient by renal services Dr. Moshe Cipro. Physical therapy completed and ongoing with recommendations for physical medicine rehabilitation consult. Admitted for comprehensive rehabilitation program   ROS Review of Systems  Constitutional: Positive for malaise/fatigue.  Gastrointestinal: Positive for constipation.  Musculoskeletal: Positive for back pain.  All other systems reviewed and are negative  Past Medical History  Diagnosis Date  . DIABETES MELLITUS, UNCONTROLLED 05/21/2009  . HYPERLIPIDEMIA 03/30/2007  . ANEMIA-IRON DEFICIENCY 01/25/2008  . HYPERTENSION 01/25/2008  . SINUSITIS- ACUTE-NOS 01/25/2008  . SECONDARY HYPERPARATHYROIDISM 05/21/2009  . RENAL INSUFFICIENCY 02/01/2008  . CERVICAL RADICULOPATHY, LEFT 01/25/2008  . BACK PAIN 05/02/2009  . NUMBNESS 05/21/2009  . Allergic rhinitis, cause unspecified 12/08/2010  . Foot ulcer     right lateral malleolus  . Critical lower limb ischemia    Past Surgical History  Procedure Laterality Date  . Av fistula placement Left   . Eye  surgery Bilateral     cataracts removed, left eye still has some oil in it.  . Colonoscopy    . Amputation Right 03/15/2014    Procedure: RIGHT  LEG  BELOW KNEE AMPUTATION ;  Surgeon: [name], MD;  Location: [facility];  Service: Orthopedics;  Laterality: Right;   Family History  Problem Relation Age of Onset  . Cancer Mother     Pancreatic   Social History:  reports that she has never smoked. She has never used smokeless tobacco. She reports that she does not drink alcohol or use illicit drugs. Allergies:  Allergies  Allergen Reactions  . Oxycodone Itching   Medications Prior to Admission  Medication Sig Dispense Refill  . amLODipine (NORVASC) 2.5 MG tablet Take 2.5 mg by mouth 2 (two) times daily.      [name] aspirin 81 MG EC tablet Take 81 mg by mouth daily.        . calcitRIOL (ROCALTROL) 0.5 MCG capsule Take 0.5 mcg by mouth daily.       . ferrous sulfate (FERROUSUL) 325 (65 FE) MG tablet Take 325 mg by mouth daily with breakfast.      . furosemide (LASIX) 40 MG tablet Take 40 mg by mouth 2 (two) times daily.      . Multiple Vitamin (MULTIVITAMIN WITH MINERALS) TABS tablet Take 1 tablet by mouth daily.      [name] oxyCODONE-acetaminophen (PERCOCET) 10-325 MG per tablet Take 1 tablet by mouth every 4 (four) hours as needed for pain.      . simvastatin (ZOCOR) 20 MG tablet Take 1 tablet (20 mg total) by mouth at bedtime.  90 tablet  3  . sitaGLIPtin (JANUVIA) 100 MG tablet Take 100 mg by mouth daily.      . insulin glargine (LANTUS)  100 unit/mL SOPN Inject 10-20 Units into the skin at bedtime. Depending on checked blood sugar        Home: Home Living Family/patient expects to be discharged to:: Inpatient rehab Living Arrangements: Other relatives (sister) Available Help at Discharge: Family (sister) Type of Home: House Home Access: Stairs to enter Technical brewer of Steps: 2 Entrance Stairs-Rails: None Home Layout: One level Home Equipment: Cane - single point Additional  Comments: Was living at home pta by herself and plans to go stay with her sister after a rehab stay   Functional History: Prior Function Level of Independence: Independent  Functional Status:  Mobility: Bed Mobility Overal bed mobility: Needs Assistance Bed Mobility: Sit to Supine Supine to sit: Mod assist;HOB elevated Sit to supine: Min guard General bed mobility comments: Min guard with sit>supine into bed. VC for technique and requires extra time but no physical assist to enter bed. Transfers Overall transfer level: Needs assistance Equipment used: Rolling walker (2 wheeled) Transfers: Sit to/from [facility] Sit to Stand: Min assist Stand pivot transfers: Mod assist Squat pivot transfers: Mod assist General transfer comment: Min assist for boost from recliner and onto bed with VC for hand placement. Mod assist with pivot for balance with posterior lean and and to control RW. Educated on safe DME use with rolling walker. Ambulation/Gait Ambulation/Gait assistance: Mod assist Ambulation Distance (Feet): 2 Feet Assistive device: Rolling walker (2 wheeled) Gait Pattern/deviations:  ("hop to" technique) Gait velocity interpretation: Below normal speed for age/gender General Gait Details: Able to take step forward and backwards with mod assist for balance and walker positioning.  VC to increase left foot clearance but only a modest improvement due to weakness.    ADL: ADL Overall ADL's : Needs assistance/impaired Eating/Feeding: Set up;Sitting Grooming: Set up;Sitting Upper Body Bathing: Set up;Sitting Lower Body Bathing: Maximal assistance;Sitting/lateral leans Upper Body Dressing : Set up;Sitting Lower Body Dressing: Maximal assistance;Sitting/lateral leans Toilet Transfer: Moderate assistance;Squat-pivot (Bed>recliner going to her left (non BKA side)) Toileting- Clothing Manipulation and Hygiene: Total assistance;Sit to/from stand (sit to partial  stand)  Cognition: Cognition Overall Cognitive Status: Within Functional Limits for tasks assessed Orientation Level: Oriented X4 Cognition Arousal/Alertness: Awake/alert Behavior During Therapy: WFL for tasks assessed/performed Overall Cognitive Status: Within Functional Limits for tasks assessed  Physical Exam: Blood pressure 162/80, pulse 86, temperature 98.8 F (37.1 C), temperature source Oral, resp. rate 18, height 5' (1.524 m), weight 54.432 kg (120 lb), SpO2 100.00%. Physical Exam Constitutional: She is oriented to person, place, and time.  HENT: oral mucosa pink missing teeth Head: Normocephalic.  Eyes: EOM are normal.  Neck: Normal range of motion. Neck supple. No thyromegaly present.  Cardiovascular: Normal rate and regular rhythm. No murmur or rub Respiratory: Effort normal and breath sounds normal. No respiratory distress.  GI: Soft. Bowel sounds are normal. She exhibits no distension. Non-tender Musculoskeletal:  Right BK wound is dressed. Has excellent knee ROM. Knee is expectedly tender.  Neurological: She is alert and oriented to person, place, and time.  UE 5/5 bilaterally delt, bicep, tricep, HI. LLE: 4- HF, 4/5 KE to 4/5 at ankle. Able to lift RLE agst gravity with ACE in place. No sensory deficits noted.  Skin:  BKA site is dressed and appropriately tender. Calluses noted over the right first toe and, metatarsal area, 5th toe dry/hardened also. No specific breakdown seen.  Psychiatric: She has a normal mood and affect. Her behavior is normal. Judgment and thought content normal  Results for orders placed during  the hospital encounter of 03/15/14 (from the past 48 hour(s))  GLUCOSE, CAPILLARY     Status: Abnormal   Collection Time    03/17/14  4:17 PM      Result Value Ref Range   Glucose-Capillary 158 (*) 70 - 99 mg/dL   Comment 1 Notify RN     Comment 2 Documented in Chart    GLUCOSE, CAPILLARY     Status: Abnormal   Collection Time    03/17/14 10:44 PM       Result Value Ref Range   Glucose-Capillary 58 (*) 70 - 99 mg/dL  GLUCOSE, CAPILLARY     Status: Abnormal   Collection Time    03/17/14 11:28 PM      Result Value Ref Range   Glucose-Capillary 146 (*) 70 - 99 mg/dL  CBC     Status: Abnormal   Collection Time    03/18/14  4:15 AM      Result Value Ref Range   WBC 17.4 (*) 4.0 - 10.5 K/uL   RBC 3.00 (*) 3.87 - 5.11 MIL/uL   Hemoglobin 8.6 (*) 12.0 - 15.0 g/dL   HCT 25.8 (*) 36.0 - 46.0 %   MCV 86.0  78.0 - 100.0 fL   MCH 28.7  26.0 - 34.0 pg   MCHC 33.3  30.0 - 36.0 g/dL   RDW 16.0 (*) 11.5 - 15.5 %   Platelets 449 (*) 150 - 400 K/uL  BASIC METABOLIC PANEL     Status: Abnormal   Collection Time    03/18/14  4:15 AM      Result Value Ref Range   Sodium 131 (*) 137 - 147 mEq/L   Potassium 3.9  3.7 - 5.3 mEq/L   Chloride 92 (*) 96 - 112 mEq/L   CO2 20  19 - 32 mEq/L   Glucose, Bld 66 (*) 70 - 99 mg/dL   BUN 53 (*) 6 - 23 mg/dL   Creatinine, Ser 5.20 (*) 0.50 - 1.10 mg/dL   Calcium 9.0  8.4 - 10.5 mg/dL   GFR calc non Af Amer 8 (*) >90 mL/min   GFR calc Af Amer 9 (*) >90 mL/min   Comment: (NOTE)     The eGFR has been calculated using the CKD EPI equation.     This calculation has not been validated in all clinical situations.     eGFR's persistently <90 mL/min signify possible Chronic Kidney     Disease.   Anion gap 19 (*) 5 - 15  GLUCOSE, CAPILLARY     Status: Abnormal   Collection Time    03/18/14  6:41 AM      Result Value Ref Range   Glucose-Capillary 58 (*) 70 - 99 mg/dL  GLUCOSE, CAPILLARY     Status: None   Collection Time    03/18/14  7:12 AM      Result Value Ref Range   Glucose-Capillary 84  70 - 99 mg/dL  GLUCOSE, CAPILLARY     Status: Abnormal   Collection Time    03/18/14  9:26 AM      Result Value Ref Range   Glucose-Capillary 217 (*) 70 - 99 mg/dL  GLUCOSE, CAPILLARY     Status: Abnormal   Collection Time    03/18/14 11:09 AM      Result Value Ref Range   Glucose-Capillary 205 (*) 70 - 99 mg/dL   GLUCOSE, CAPILLARY     Status: Abnormal   Collection Time  03/18/14  4:26 PM      Result Value Ref Range   Glucose-Capillary 59 (*) 70 - 99 mg/dL   Comment 1 Notify RN    GLUCOSE, CAPILLARY     Status: None   Collection Time    03/18/14  5:19 PM      Result Value Ref Range   Glucose-Capillary 76  70 - 99 mg/dL  GLUCOSE, CAPILLARY     Status: Abnormal   Collection Time    03/18/14  8:16 PM      Result Value Ref Range   Glucose-Capillary 142 (*) 70 - 99 mg/dL  GLUCOSE, CAPILLARY     Status: None   Collection Time    03/19/14 12:00 AM      Result Value Ref Range   Glucose-Capillary 73  70 - 99 mg/dL  GLUCOSE, CAPILLARY     Status: Abnormal   Collection Time    03/19/14  4:43 AM      Result Value Ref Range   Glucose-Capillary 42 (*) 70 - 99 mg/dL  GLUCOSE, CAPILLARY     Status: None   Collection Time    03/19/14  5:07 AM      Result Value Ref Range   Glucose-Capillary 95  70 - 99 mg/dL  GLUCOSE, CAPILLARY     Status: Abnormal   Collection Time    03/19/14  7:39 AM      Result Value Ref Range   Glucose-Capillary 112 (*) 70 - 99 mg/dL   No results found.     Medical Problem List and Plan: 1. Functional deficits secondary to right BKA secondary to gangrenous changes 03/16/2014 2.  DVT Prophylaxis/Anticoagulation: SCDs left lower extremity. Monitor for any signs of DVT 3. Pain Management: Baclofen 10 mg every 8 hours as needed muscle spasms, Hydrocodone as needed. Monitor with increased mobility 4. acute on chronic blood loss anemia. Patient has been transfused. Continue iron supplement. Followup CBC 5. Neuropsych: This patient is capable of making decisions on his own behalf. 6. Hypertension. Norvasc 10 mg daily, Coreg 6.25 mg twice a day, Lasix 40 mg twice a day. Monitor with increased mobility 7. Diabetes mellitus with peripheral neuropathy. Hemoglobin A1c 8.2. Lantus insulin 20 units daily. Check blood sugars a.c. and at bedtime. 8. Chronic renal insufficiency.  Baseline creatinine 6.42. Patient currently not on dialysis followed outpatient by renal services Dr. Moshe Cipro. 9. Hyperlipidemia. Zocor  Post Admission Physician Evaluation: 1. Functional deficits secondary  to right BKA. 2. Patient is admitted to receive collaborative, interdisciplinary care between the physiatrist, rehab nursing staff, and therapy team. 3. Patient's level of medical complexity and substantial therapy needs in context of that medical necessity cannot be provided at a lesser intensity of care such as a SNF. 4. Patient has experienced substantial functional loss from his/her baseline which was documented above under the "Functional History" and "Functional Status" headings.  Judging by the patient's diagnosis, physical exam, and functional history, the patient has potential for functional progress which will result in measurable gains while on inpatient rehab.  These gains will be of substantial and practical use upon discharge  in facilitating mobility and self-care at the household level. 5. Physiatrist will provide 24 hour management of medical needs as well as oversight of the therapy plan/treatment and provide guidance as appropriate regarding the interaction of the two. 6. 24 hour rehab nursing will assist with bladder management, bowel management, safety, skin/wound care, disease management, medication administration, pain management and patient education  and help  integrate therapy concepts, techniques,education, etc. 7. PT will assess and treat for/with: Lower extremity strength, range of motion, stamina, balance, functional mobility, safety, adaptive techniques and equipment, pain mgt, pre-prosthetic education, egosupport, caregiver education.   Goals are: supervision to mod I at w/c level. 8. OT will assess and treat for/with: ADL's, functional mobility, safety, upper extremity strength, adaptive techniques and equipment, pain mgt, stump protection, egosupport, community  reintegration.   Goals are: mod I to supervision. 9. SLP will assess and treat for/with: n/a.  Goals are: n/a. 10. Case Management and Social Worker will assess and treat for psychological issues and discharge planning. 11. Team conference will be held weekly to assess progress toward goals and to determine barriers to discharge. 12. Patient will receive at least 3 hours of therapy per day at least 5 days per week. 13. ELOS: 9-12 days       14. Prognosis:  excellent     Meredith Staggers, MD, [facility]   03/19/2014

## 2014-03-21 ENCOUNTER — Inpatient Hospital Stay (HOSPITAL_COMMUNITY): Payer: BC Managed Care – PPO

## 2014-03-21 ENCOUNTER — Inpatient Hospital Stay (HOSPITAL_COMMUNITY): Payer: BC Managed Care – PPO | Admitting: Occupational Therapy

## 2014-03-21 DIAGNOSIS — IMO0001 Reserved for inherently not codable concepts without codable children: Secondary | ICD-10-CM

## 2014-03-21 DIAGNOSIS — E1165 Type 2 diabetes mellitus with hyperglycemia: Secondary | ICD-10-CM

## 2014-03-21 DIAGNOSIS — I739 Peripheral vascular disease, unspecified: Secondary | ICD-10-CM

## 2014-03-21 DIAGNOSIS — I10 Essential (primary) hypertension: Secondary | ICD-10-CM

## 2014-03-21 DIAGNOSIS — S88119A Complete traumatic amputation at level between knee and ankle, unspecified lower leg, initial encounter: Secondary | ICD-10-CM

## 2014-03-21 DIAGNOSIS — N186 End stage renal disease: Secondary | ICD-10-CM

## 2014-03-21 DIAGNOSIS — L98499 Non-pressure chronic ulcer of skin of other sites with unspecified severity: Secondary | ICD-10-CM

## 2014-03-21 LAB — GLUCOSE, CAPILLARY
Glucose-Capillary: 113 mg/dL — ABNORMAL HIGH (ref 70–99)
Glucose-Capillary: 184 mg/dL — ABNORMAL HIGH (ref 70–99)
Glucose-Capillary: 194 mg/dL — ABNORMAL HIGH (ref 70–99)
Glucose-Capillary: 171 mg/dL — ABNORMAL HIGH (ref 70–99)

## 2014-03-21 LAB — COMPREHENSIVE METABOLIC PANEL
ALT: 10 U/L (ref 0–35)
AST: 19 U/L (ref 0–37)
Albumin: 2.1 g/dL — ABNORMAL LOW (ref 3.5–5.2)
Alkaline Phosphatase: 78 U/L (ref 39–117)
Anion gap: 14 (ref 5–15)
BUN: 57 mg/dL — ABNORMAL HIGH (ref 6–23)
CO2: 23 mEq/L (ref 19–32)
Calcium: 9.7 mg/dL (ref 8.4–10.5)
Chloride: 96 mEq/L (ref 96–112)
Creatinine, Ser: 5.34 mg/dL — ABNORMAL HIGH (ref 0.50–1.10)
GFR calc Af Amer: 9 mL/min — ABNORMAL LOW (ref >90)
GFR calc non Af Amer: 8 mL/min — ABNORMAL LOW (ref >90)
Glucose, Bld: 117 mg/dL — ABNORMAL HIGH (ref 70–99)
Potassium: 4.9 mEq/L (ref 3.7–5.3)
Sodium: 133 mEq/L — ABNORMAL LOW (ref 137–147)
Total Bilirubin: <0.2 mg/dL — ABNORMAL LOW (ref 0.3–1.2)
Total Protein: 6.7 g/dL (ref 6.0–8.3)

## 2014-03-21 LAB — CBC WITH DIFFERENTIAL/PLATELET
Basophils Absolute: 0.1 10*3/uL (ref 0.0–0.1)
Basophils Relative: 1 % (ref 0–1)
Eosinophils Absolute: 0.3 10*3/uL (ref 0.0–0.7)
Eosinophils Relative: 2 % (ref 0–5)
HCT: 25.8 % — ABNORMAL LOW (ref 36.0–46.0)
Hemoglobin: 8.3 g/dL — ABNORMAL LOW (ref 12.0–15.0)
Lymphocytes Relative: 19 % (ref 12–46)
Lymphs Abs: 2.5 10*3/uL (ref 0.7–4.0)
MCH: 28.5 pg (ref 26.0–34.0)
MCHC: 32.2 g/dL (ref 30.0–36.0)
MCV: 88.7 fL (ref 78.0–100.0)
Monocytes Absolute: 1.1 10*3/uL — ABNORMAL HIGH (ref 0.1–1.0)
Monocytes Relative: 8 % (ref 3–12)
Neutro Abs: 9.4 10*3/uL — ABNORMAL HIGH (ref 1.7–7.7)
Neutrophils Relative %: 70 % (ref 43–77)
Platelets: 531 10*3/uL — ABNORMAL HIGH (ref 150–400)
RBC: 2.91 MIL/uL — ABNORMAL LOW (ref 3.87–5.11)
RDW: 15.7 % — ABNORMAL HIGH (ref 11.5–15.5)
WBC: 13.4 10*3/uL — ABNORMAL HIGH (ref 4.0–10.5)

## 2014-03-21 NOTE — Progress Notes (Signed)
[place] [facility] PHYSICAL MEDICINE & REHABILITATION     PROGRESS NOTE    Subjective/Complaints: No new issues. Had uneventful night. Stump shrinker very tight  Objective: Vital Signs: Blood pressure 180/71, pulse 84, temperature 99.1 F (37.3 C), temperature source Oral, resp. rate 18, height 5' (1.524 m), weight 54.5 kg (120 lb 2.4 oz), SpO2 99.00%. No results found.  Recent Labs  03/21/14 0627  WBC 13.4*  HGB 8.3*  HCT 25.8*  PLT 531*    Recent Labs  03/21/14 0627  NA 133*  K 4.9  CL 96  GLUCOSE 117*  BUN 57*  CREATININE 5.34*  CALCIUM 9.7   CBG (last 3)   Recent Labs  03/20/14 1652 03/20/14 2129 03/21/14 0738  GLUCAP 251* 241* 113*    Wt Readings from Last 3 Encounters:  03/21/14 54.5 kg (120 lb 2.4 oz)  03/19/14 54.432 kg (120 lb)  03/19/14 54.432 kg (120 lb)    Physical Exam:  Constitutional: She is oriented to person, place, and time.  HENT: oral mucosa pink missing teeth  Head: Normocephalic.  Eyes: EOM are normal.  Neck: Normal range of motion. Neck supple. No thyromegaly present.  Cardiovascular: Normal rate and regular rhythm. No murmur or rub  Respiratory: Effort normal and breath sounds normal. No respiratory distress.  GI: Soft. Bowel sounds are normal. She exhibits no distension. Non-tender Musculoskeletal:  Right BK wound is dressed. Has excellent knee ROM. Knee is expectedly tender.  Neurological: She is alert and oriented to person, place, and time.  UE 5/5 bilaterally delt, bicep, tricep, HI. LLE: 4- HF, 4/5 KE to 4/5 at ankle. Able to lift RLE agst gravity with ACE in place. No sensory deficits noted.  Skin:  BKA site clean. No drainage. shrinker too tight. Calluses noted over the left first toe and, metatarsal area, 5th toe dry/hardened also. No specific breakdown seen.  Psychiatric: She has a normal mood and affect. Her behavior is normal. Judgment and thought content normal    Assessment/Plan: 1. Functional deficits secondary to  right BKA which require 3+ hours per day of interdisciplinary therapy in a comprehensive inpatient rehab setting. Physiatrist is providing close team supervision and 24 hour management of active medical problems listed below. Physiatrist and rehab team continue to assess barriers to discharge/monitor patient progress toward functional and medical goals. FIM:                   Comprehension Comprehension Mode: Auditory Comprehension: 5-Follows basic conversation/direction: With no assist  Expression Expression Mode: Verbal Expression: 7-Expresses complex ideas: With no assist  Social Interaction Social Interaction: 7-Interacts appropriately with others - No medications needed.  Problem Solving Problem Solving: 5-Solves basic problems: With no assist  Memory Memory: 5-Recognizes or recalls 90% of the time/requires cueing < 10% of the time  Medical Problem List and Plan:  1. Functional deficits secondary to right BKA secondary to gangrenous changes 03/16/2014   -use ACE wrap for night---shrinker too tight 2. DVT Prophylaxis/Anticoagulation: SCDs left lower extremity. Monitor for any signs of DVT  3. Pain Management: Baclofen 10 mg every 8 hours as needed muscle spasms, Hydrocodone as needed. Monitor with increased mobility  4. acute on chronic blood loss anemia. Patient has been transfused. Continue iron supplement. Followup CBC  5. Neuropsych: This patient is capable of making decisions on his own behalf.  6. Hypertension. Norvasc 10 mg daily, Coreg 6.25 mg twice a day, Lasix 40 mg twice a day. Monitor with increased mobility  7. Diabetes  mellitus with peripheral neuropathy. Hemoglobin A1c 8.2. Lantus insulin 20 units daily. Check blood sugars a.c. and at bedtime.   -may need day time insulin---follow for pattern today 8. Chronic renal insufficiency. Baseline creatinine 6.42. Patient currently not on dialysis followed outpatient by renal services Dr. Moshe Cipro.   -Cr/BUN  near baseline 9. Hyperlipidemia. Zocor 10. Leukocytosis---WBC trending down  LOS (Days) 1 A FACE TO FACE EVALUATION WAS PERFORMED  Jnyah Brazee T 03/21/2014 8:13 AM

## 2014-03-21 NOTE — Progress Notes (Signed)
Patient information reviewed and entered into eRehab system by Azel Gumina, RN, CRRN, PPS Coordinator.  Information including medical coding and functional independence measure will be reviewed and updated through discharge.    

## 2014-03-21 NOTE — Evaluation (Signed)
Physical Therapy Assessment and Plan  Patient Details  Name: Carolyn Valenzuela MRN: [id number] Date of Birth: 10-30-1950  PT Diagnosis: Abnormality of gait, Difficulty walking and Pain in joint Rehab Potential: Good ELOS: 7 days   Today's Date: 03/21/2014 Time: 0810-0910 Time Calculation (min): 60 min  Problem List:  Patient Active Problem List   Diagnosis Date Noted  . S/P BKA (below knee amputation) 03/20/2014  . 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 Critical lower limb ischemia 01/09/2014  . Right foot ulcer 11/07/2013  . Toe pain 09/06/2013  . Pre-ulcerative corn or callous 09/06/2013  . Lower extremity pain 09/06/2013  . End stage renal disease 04/06/2012  . Left low back pain 12/08/2011  . Hip bursitis, left 12/08/2011  . Left leg pain 12/08/2011  . Abdominal pain, bilateral lower quadrant 07/08/2011  . Preventative health care 12/08/2010  . Allergic rhinitis, cause unspecified 12/08/2010  . DIABETES MELLITUS, UNCONTROLLED 05/21/2009  . SECONDARY HYPERPARATHYROIDISM 05/21/2009  . NUMBNESS 05/21/2009  . BACK PAIN 05/02/2009  . ANEMIA-IRON DEFICIENCY 01/25/2008  . HYPERTENSION 01/25/2008  . CERVICAL RADICULOPATHY, LEFT 01/25/2008  . HYPERLIPIDEMIA 03/30/2007    Past Medical History:  Past Medical History  Diagnosis Date  . DIABETES MELLITUS, UNCONTROLLED 05/21/2009  . HYPERLIPIDEMIA 03/30/2007  . ANEMIA-IRON DEFICIENCY 01/25/2008  . HYPERTENSION 01/25/2008  . SINUSITIS- ACUTE-NOS 01/25/2008  . SECONDARY HYPERPARATHYROIDISM 05/21/2009  . RENAL INSUFFICIENCY 02/01/2008  . CERVICAL RADICULOPATHY, LEFT 01/25/2008  . BACK PAIN 05/02/2009  . NUMBNESS 05/21/2009  . Allergic rhinitis, cause unspecified 12/08/2010  . Foot ulcer     right lateral malleolus  . Critical lower limb ischemia    Past Surgical History:  Past Surgical History  Procedure Laterality Date  . Av fistula placement Left   . Eye surgery Bilateral     cataracts removed, left eye still has  some oil in it.  . Colonoscopy    . Amputation Right 03/15/2014    Procedure: RIGHT  LEG  BELOW KNEE AMPUTATION ;  Surgeon: [name], MD;  Location: [facility];  Service: Orthopedics;  Laterality: Right;    Assessment & Plan Clinical Impression: Carolyn Valenzuela is a 63 y.o. right-handed female with history of poorly controlled diabetes mellitus with peripheral neuropathy, chronic anemia requiring multiple outpatient iron transfusions, chronic renal insufficiency with baseline creatinine 6.42 currently not on dialysis followed by renal services. Patient was independent with a cane prior to admission living alone with good supportive of family. Presented 03/15/2014 with progressive gangrenous ischemic changes right lower extremity. Wound was not felt to be salvageable and underwent right below-knee amputation 03/16/2014 per Dr. Doran Durand. Postoperative pain management. Acute blood loss anemia 5.8 and transfused with  latest hemoglobin 8.6. Renal function monitored closely with creatinine 5.20 on 03/18/2014 and patient had been followed as outpatient by renal services Dr. Moshe Cipro. Physical therapy completed and ongoing with recommendations for physical medicine rehabilitation consult. Admitted on 03/20/14 for comprehensive rehabilitation program   Patient currently requires min with mobility secondary to muscle weakness, and decreased standing balance and decreased postural control.  Prior to hospitalization, patient was modified independent  with mobility and lived with Alone (Will go live w/ sister if needed) in a House home.  Home access is 2Stairs to enter.  Patient will benefit from skilled PT intervention to maximize safe functional mobility, minimize fall risk and decrease caregiver burden for planned discharge home with intermittent assist.  Anticipate patient will benefit from follow up OP at discharge.  PT - End of Session Activity Tolerance: Tolerates 10 - 20 min activity with multiple  rests Endurance Deficit: Yes PT Assessment Rehab Potential: Good Barriers to Discharge: Inaccessible home environment;Decreased caregiver support PT Patient demonstrates impairments in the following area(s): Balance;Edema;Endurance;Motor;Pain;Safety;Skin Integrity PT Transfers Functional Problem(s): Bed Mobility;Bed to Chair;Car;Furniture PT Locomotion Functional Problem(s): Ambulation;Wheelchair Mobility;Stairs PT Plan PT Intensity: Minimum of 1-2 x/day ,45 to 90 minutes PT Frequency: 5 out of 7 days PT Duration Estimated Length of Stay: 7 days PT Treatment/Interventions: Ambulation/gait training;Balance/vestibular training;Discharge planning;DME/adaptive equipment instruction;Functional mobility training;Patient/family education;Pain management;Therapeutic Activities;Therapeutic Exercise;UE/LE Strength taining/ROM PT Transfers Anticipated Outcome(s): mod (I) w/ bed mobility and stand pivot transfer bed <> w/c w/ RW PT Locomotion Anticipated Outcome(s): (S) for stairs, mod (I) w/ RW in home environment, mod (I) w/c level in community PT Recommendation Follow Up Recommendations: Outpatient PT (when ready for prosthetic fitting) Patient destination: Home Equipment Recommended: Rolling walker with 5" wheels;Wheelchair cushion (measurements);Wheelchair (measurements)  Skilled Therapeutic Intervention W/C Management: Therapist switched pt to [place] wheelchair for improved fit and improved ability to self-propel. Pt educated on use of elevating leg rest for RLE edema and pain management.  Therapeutic Activity: Worked on functional ambulation w/ RW in room w/ obstacles to simulate household, pt w/ MinA for safety and VC's for soft landing w/ hop to gait. Worked on bed mobility with pt moving supine <> sit with MinA initially for managing RLE then supervision.  PT Evaluation Precautions/Restrictions Precautions Precautions: Fall Required Braces or Orthoses: Knee Immobilizer - Right Knee  Immobilizer - Right: On at all times Restrictions Weight Bearing Restrictions: Yes RLE Weight Bearing: Non weight bearing General Chart Reviewed: Yes Vital SignsTherapy Vitals Temp: 99.1 F (37.3 C) Temp src: Oral Pulse Rate: 85 Resp: 18 BP: 194/68 mmHg (RN notified) Patient Position (if appropriate): Lying Oxygen Therapy SpO2: 99 % O2 Device: None (Room air) Pain Pain Assessment Pain Assessment: No/denies pain Pain Score: 0-No pain Home Living/Prior Functioning Home Living Available Help at Discharge: Family Type of Home: House Home Access: Stairs to enter Technical brewer of Steps: 2 Entrance Stairs-Rails: None Home Layout: One level Additional Comments: Stayed w/ sister PTA for 2-3 weeks, independent before that. Plans to d/c to other sister's house  Lives With: Alone (Will go live w/ sister if needed) Prior Function Level of Independence:  (Sister assisted with iADLs, pt ambulated w/ cane PTA)  Able to Take Stairs?: Yes Driving: Yes Vocation:  (Worked until April for [employer], planning on retiring) Vision/Perception     Cognition Overall Cognitive Status: Within Advertising copywriter for tasks assessed Arousal/Alertness: Awake/alert Orientation Level: Oriented X4 Attention: Sustained Focused Attention: Appears intact Sustained Attention: Appears intact Memory: Appears intact Awareness: Appears intact Sensation Sensation Additional Comments: Reports "tingling" in phantom limb Motor     Mobility Bed Mobility Bed Mobility: Rolling Right;Rolling Left;Supine to Sit Rolling Right: 4: Min assist Rolling Right Details (indicate cue type and reason): (a) w/ managing RLE Rolling Left: 4: Min assist Rolling Left Details (indicate cue type and reason): (a) w/ managing RLE Supine to Sit: 4: Min assist Supine to Sit Details (indicate cue type and reason): (a) w/ managing RLE Locomotion  Ambulation Ambulation: Yes Ambulation/Gait Assistance: 4: Min  assist Ambulation Distance (Feet): 15 Feet Assistive device: Rolling walker Ambulation/Gait Assistance Details: assist for safety, VC's for soft landing in hop to gait Stairs / Additional Locomotion Stairs: No Wheelchair Mobility Wheelchair Mobility: No  Trunk/Postural Assessment  Cervical Assessment Cervical Assessment: Within Scientist, physiological  Assessment: Within Functional Limits Lumbar Assessment Lumbar Assessment: Within Functional Limits  Balance Balance Balance Assessed: Yes Static Sitting Balance Static Sitting - Balance Support: No upper extremity supported Static Sitting - Level of Assistance: 5: Stand by assistance Static Sitting - Comment/# of Minutes: 10 Dynamic Sitting Balance Dynamic Sitting - Balance Support:  (Intermittent UE support) Dynamic Sitting - Level of Assistance: 4: Min assist Dynamic Sitting - Balance Activities: Lateral lean/weight shifting;Trunk control activities (leaning for donning second hospital gown) Static Standing Balance Static Standing - Balance Support: Bilateral upper extremity supported Static Standing - Level of Assistance: 4: Min assist Static Standing - Comment/# of Minutes: 2 Extremity Assessment      RLE Assessment RLE Assessment: Exceptions to [facility] RLE Strength RLE Overall Strength Comments: Grossly 4/5 at hip and knee LLE Assessment LLE Assessment: Within Functional Limits  FIM:  FIM - Control and instrumentation engineer Devices: Walker;Bed rails;Arm rests Bed/Chair Transfer: 4: Supine > Sit: Min A (steadying Pt. > 75%/lift 1 leg);4: Sit > Supine: Min A (steadying pt. > 75%/lift 1 leg);4: Bed > Chair or W/C: Min A (steadying Pt. > 75%);4: Chair or W/C > Bed: Min A (steadying Pt. > 75%) FIM - Locomotion: Wheelchair Locomotion: Wheelchair: 0: Activity did not occur FIM - Locomotion: Ambulation Locomotion: Ambulation Assistive Devices: Administrator Ambulation/Gait Assistance: 4:  Min assist Locomotion: Ambulation: 1: Travels less than 50 ft with minimal assistance (Pt.>75%) FIM - Locomotion: Stairs Locomotion: Stairs: 0: Activity did not occur   Refer to Care Plan for Long Term Goals  Recommendations for other services: None  Discharge Criteria: Patient will be discharged from PT if patient refuses treatment 3 consecutive times without medical reason, if treatment goals not met, if there is a change in medical status, if patient makes no progress towards goals or if patient is discharged from hospital.  The above assessment, treatment plan, treatment alternatives and goals were discussed and mutually agreed upon: by patient  Glennon Hamilton, PT, DPT 03/21/2014, 9:20 AM

## 2014-03-21 NOTE — Evaluation (Signed)
Occupational Therapy Assessment and Plan And Treatment Session Notes  Patient Details  Name: Carolyn Valenzuela MRN: [id number] Date of Birth: 10/29/1950  OT Diagnosis: acute pain, disturbance of vision, muscle weakness (generalized) and dynamic balance Rehab Potential: Rehab Potential: Good ELOS: 7 days   Today's Date: 03/21/2014 Time: 1100-1210 and 163-846 Time Calculation (min): 70 min and 30 min  Problem List:  Patient Active Problem List   Diagnosis Date Noted  . S/P BKA (below knee amputation) 03/20/2014  . 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 Critical lower limb ischemia 01/09/2014  . Right foot ulcer 11/07/2013  . Toe pain 09/06/2013  . Pre-ulcerative corn or callous 09/06/2013  . Lower extremity pain 09/06/2013  . End stage renal disease 04/06/2012  . Left low back pain 12/08/2011  . Hip bursitis, left 12/08/2011  . Left leg pain 12/08/2011  . Abdominal pain, bilateral lower quadrant 07/08/2011  . Preventative health care 12/08/2010  . Allergic rhinitis, cause unspecified 12/08/2010  . DIABETES MELLITUS, UNCONTROLLED 05/21/2009  . SECONDARY HYPERPARATHYROIDISM 05/21/2009  . NUMBNESS 05/21/2009  . BACK PAIN 05/02/2009  . ANEMIA-IRON DEFICIENCY 01/25/2008  . HYPERTENSION 01/25/2008  . CERVICAL RADICULOPATHY, LEFT 01/25/2008  . HYPERLIPIDEMIA 03/30/2007    Past Medical History:  Past Medical History  Diagnosis Date  . DIABETES MELLITUS, UNCONTROLLED 05/21/2009  . HYPERLIPIDEMIA 03/30/2007  . ANEMIA-IRON DEFICIENCY 01/25/2008  . HYPERTENSION 01/25/2008  . SINUSITIS- ACUTE-NOS 01/25/2008  . SECONDARY HYPERPARATHYROIDISM 05/21/2009  . RENAL INSUFFICIENCY 02/01/2008  . CERVICAL RADICULOPATHY, LEFT 01/25/2008  . BACK PAIN 05/02/2009  . NUMBNESS 05/21/2009  . Allergic rhinitis, cause unspecified 12/08/2010  . Foot ulcer     right lateral malleolus  . Critical lower limb ischemia    Past Surgical History:  Past Surgical History  Procedure Laterality Date   . Av fistula placement Left   . Eye surgery Bilateral     cataracts removed, left eye still has some oil in it.  . Colonoscopy    . Amputation Right 03/15/2014    Procedure: RIGHT  LEG  BELOW KNEE AMPUTATION ;  Surgeon: [name], MD;  Location: [facility];  Service: Orthopedics;  Laterality: Right;    Assessment & Plan Clinical Impression: Carolyn Valenzuela is a 63 y.o. right-handed female with history of poorly controlled diabetes mellitus with peripheral neuropathy, chronic anemia requiring multiple outpatient iron transfusions, chronic renal insufficiency with baseline creatinine 6.42 currently not on dialysis followed by renal services. Patient was independent with a cane prior to admission living alone with good supportive of family. Presented 03/15/2014 with progressive gangrenous ischemic changes right lower extremity. Wound was not felt to be salvageable and underwent right below-knee amputation 03/16/2014 per Dr. Doran Durand. Postoperative pain management. Acute blood loss anemia 5.8 and transfused with hemoglobin 8.3 on 03/21/14. Renal function monitored closely with creatinine 5.20 on 03/18/2014 and patient had been followed as outpatient by renal services Dr. Moshe Cipro.  Patient transferred to CIR on 03/20/2014 .    Patient currently requires min with basic self-care skills and IADL secondary to muscle weakness and decreased standing balance, decreased postural control and decreased balance strategies.  Prior to hospitalization patient was Mod I alone at home except for the last few weeks she lived at [place] home.   Patient will benefit from skilled intervention to increase independence with basic self-care skills and increase level of independence with iADL prior to discharge home independently.  Anticipate patient will require intermittent supervision and follow up home health and  follow up outpatient.  OT - End of Session Activity Tolerance: Decreased this session Endurance Deficit:  Yes OT Assessment Rehab Potential: Good Barriers to Discharge:  (none) OT Patient demonstrates impairments in the following area(s): Balance;Endurance;Safety;Skin Integrity OT Basic ADL's Functional Problem(s): Grooming;Bathing;Dressing;Toileting OT Advanced ADL's Functional Problem(s): Simple Meal Preparation;Light Housekeeping OT Transfers Functional Problem(s): Toilet;Tub/Shower OT Plan OT Intensity: Minimum of 1-2 x/day, 45 to 90 minutes OT Frequency: 5 out of 7 days OT Duration/Estimated Length of Stay: 7 days OT Treatment/Interventions: Balance/vestibular training;Community reintegration;DME/adaptive equipment instruction;Disease mangement/prevention;Discharge planning;Functional mobility training;Psychosocial support;Patient/family education;Pain management;Self Care/advanced ADL retraining;Skin care/wound managment;Therapeutic Activities;Therapeutic Exercise;UE/LE Strength taining/ROM OT Basic Self-Care Anticipated Outcome(s): Mod I OT Toileting Anticipated Outcome(s): Mod I OT Bathroom Transfers Anticipated Outcome(s): Mod I toilet, Supervision tub bench OT Recommendation Recommendations for Other Services:  (none) Patient destination: Home Follow Up Recommendations: Home health OT Equipment Recommended: 3 in 1 bedside comode;Tub/shower seat  Skilled Therapeutic Intervention 1)  OT Eval and self care retraining to include sponge bath EOB (declined shower today), dress, groom and transfer to w/c.  Focused session on dynamic balance, adaptive techniques, pain management and residual limb management, activity tolerance.  Patient in w/c at end of session.  2)  Patient resting in w/c upon arrival.  Reveiwed DME options/recommendations for bathroom, patient propelled w/c to therapy bathroom and OT demonstrated tub bench transfer.  Patient reports that she used to sit in bottom ob tub to bathe PTA however realizes that she will nto be able to bathe this way for awhile.  Patient will also  benefit from a [facility] due to urgency issues.  Once back in her room, patient ambulated with RW to and from bathroom to complete toileting tasks with 3 in 1 over commode.  Patient close supervision-steady assist with this task.  OT Evaluation Precautions/Restrictions  Precautions Precautions: Fall Required Braces or Orthoses: Knee Immobilizer - Right (custom made) Knee Immobilizer - Right: On at all times Restrictions Weight Bearing Restrictions: Yes RLE Weight Bearing: Non weight bearing  Pain Denies pain in both sessions Home Living/Prior [place] expects to be discharged to:: Private residence Living Arrangements: Other relatives Available Help at Discharge: Family;Available PRN/intermittently Type of Home: House Home Access: Stairs to enter [employer] of Steps: 2 Entrance Stairs-Rails: None Home Layout: One level Additional Comments: Stayed w/ sister PTA for 2-3 weeks, independent before that. Plans to either d/cback home or to other sister's house if needed.  Patient has tub/shower combo with curtain and will need tub bench for safety  Lives With: Alone (sister can come to her house if needed or patient can go to sister's house at first) Prior Function Level of Independence: Needs assistance with ADLs;Independent with transfers  Able to Take Stairs?: Yes Driving: Yes Vocation:  (per chart, patient worked full time until April) ADL Refer to FIM below for details. Overall min assist Vision/Perception  Vision- History Baseline Vision/History: Wears glasses Wears Glasses: At all times Patient Visual Report: No change from baseline Vision- Assessment Additional Comments: Patient reports that she has "oil in her eye and was supposed to go for eye surgery."  "I have diabetes in my eye".  Difficulty reading without placing item just inches from eyes.  Cognition Overall Cognitive Status: Within Functional Limits for tasks  assessed Arousal/Alertness: Awake/alert Orientation Level: Oriented X4 Attention: Sustained Focused Attention: Appears intact Sustained Attention: Appears intact Memory: Appears intact Awareness: Appears intact Sensation Sensation Additional Comments: Reports "tingling" in phantom limb Mobility  Bed Mobility Bed Mobility:  Rolling Right;Rolling Left;Supine to Sit Rolling Right: 4: Min assist Rolling Right Details (indicate cue type and reason): (a) w/ managing RLE Rolling Left: 4: Min assist Rolling Left Details (indicate cue type and reason): (a) w/ managing RLE Supine to Sit: 4: Min assist Supine to Sit Details (indicate cue type and reason): (a) w/ managing RLE  Trunk/Postural Assessment  Cervical Assessment Cervical Assessment: Within Functional Limits Thoracic Assessment Thoracic Assessment: Within Functional Limits Lumbar Assessment Lumbar Assessment: Within Functional Limits  Balance Balance Balance Assessed: Yes Static Sitting Balance Static Sitting - Balance Support: No upper extremity supported Static Sitting - Level of Assistance: 5: Stand by assistance Static Sitting - Comment/# of Minutes: 10 Dynamic Sitting Balance Dynamic Sitting - Balance Support:  (Intermittent UE support) Dynamic Sitting - Level of Assistance: 4: Min assist Dynamic Sitting - Balance Activities: Lateral lean/weight shifting;Trunk control activities (leaning for donning second hospital gown) Static Standing Balance Static Standing - Balance Support: Bilateral upper extremity supported Static Standing - Level of Assistance: 4: Min assist Static Standing - Comment/# of Minutes: 2 Extremity/Trunk Assessment RUE Assessment RUE Assessment: Within Functional Limits LUE Assessment LUE Assessment: Within Functional Limits  FIM:  FIM - Eating Eating Activity: 6: Assistive device: dentures FIM - Grooming Grooming Steps: Wash, rinse, dry face;Wash, rinse, dry hands;Oral care, brush teeth, clean  dentures;Brush, comb hair Grooming: 5: Set-up assist to obtain items (seated) FIM - Bathing Bathing Steps Patient Completed: Chest;Right Arm;Left Arm;Abdomen;Front perineal area;Buttocks;Right upper leg;Left upper leg;Left lower leg (including foot) Bathing: 4: Steadying assist (sit EOB and stand) FIM - Upper Body Dressing/Undressing Upper body dressing/undressing steps patient completed: Thread/unthread right bra strap;Thread/unthread left bra strap;Hook/unhook bra;Thread/unthread right sleeve of pullover shirt/dresss;Thread/unthread left sleeve of pullover shirt/dress;Put head through opening of pull over shirt/dress;Pull shirt over trunk Upper body dressing/undressing: 5: Set-up assist to: Obtain clothing/put away FIM - Lower Body Dressing/Undressing Lower body dressing/undressing steps patient completed: Thread/unthread right underwear leg;Thread/unthread left underwear leg;Pull underwear up/down;Thread/unthread right pants leg;Thread/unthread left pants leg;Don/Doff left sock;Don/Doff left shoe Lower body dressing/undressing: 4: Min-Patient completed 75 plus % of tasks FIM - Toileting Toileting steps completed by patient: Adjust clothing prior to toileting;Adjust clothing after toileting;Performs perineal hygiene Toileting Assistive Devices: Grab bar or rail for support;Toilet Aid/prosthesis/orthosis Toileting: 4: Steadying assist FIM - IT sales professional Transfer: 0: Activity did not occur FIM - Radio producer Devices: Walker (3 in 1 over commode) Toilet Transfers: 4-To toilet/BSC: Min A (steadying Pt. > 75%);4-From toilet/BSC: Min A (steadying Pt. > 75%) FIM - Tub/Shower Transfers Tub/shower Transfers: 0-Activity did not occur or was simulated (declined at this time)   Refer to Care Plan for Long Term Goals  Recommendations for other services: None  Discharge Criteria: Patient will be discharged from OT if patient refuses treatment 3  consecutive times without medical reason, if treatment goals not met, if there is a change in medical status, if patient makes no progress towards goals or if patient is discharged from hospital.  The above assessment, treatment plan, treatment alternatives and goals were discussed and mutually agreed upon: by patient  Creta Dorame 03/21/2014, 5:22 PM

## 2014-03-21 NOTE — Progress Notes (Signed)
Physical Therapy Session Note  Patient Details  Name: Carolyn Valenzuela MRN: [id number] Date of Birth: [birth year]  Today's Date: 03/21/2014 Time: 1305-1350 Time Calculation (min): 45 min  Short Term Goals: Week 1:  PT Short Term Goal 1 (Week 1): STG=LTGs due to ELOS  Skilled Therapeutic Interventions/Progress Updates:  Pt. Received seated in w/c with orthosis donned and elevated. Pt. Agreeable to therapy. Pt. Requests toileting prior to session.  1:1 Treatment focused on w/c mgmt, gait/balance, and functional transfers. Pt. Assist level is (S) with RW and w/c mgmt. Pt. Response was good with moderate carry-over; pt. Responds to block-learning before moving on to next step. Pt. Demonstrates ability to incorporate new learning, but states she is overwhelmed with "all the stuff" she has to get use to and all the therapy sessions.  Pain: no c/o pain pre, during, post session.  Pt. Condition post therapy session: positioned seated in w/c with R residual limb positioned level with seat to prevent knee flexion contracture, with all needs within reach. Family members arriving for visit.   Therapy Documentation Precautions:  Precautions Precautions: Fall Required Braces or Orthoses: Knee Immobilizer - Right Knee Immobilizer - Right: On at all times Restrictions Weight Bearing Restrictions: Yes RLE Weight Bearing: Non weight bearing Pain: Pain Assessment Pain Assessment: 0-10 Pain Score: 0-No pain Pain Location: Leg Pain Orientation: Right Pain Descriptors / Indicators: Aching Patients Stated Pain Goal: 0 Pain Intervention(s): Medication (See eMAR) Locomotion : Ambulation Ambulation/Gait Assistance: 5: Supervision Wheelchair Mobility Distance: 150   See FIM for current functional status  Therapy/Group: Individual Therapy  Juluis Mire 03/21/2014, 4:32 PM

## 2014-03-22 ENCOUNTER — Inpatient Hospital Stay (HOSPITAL_COMMUNITY): Payer: BC Managed Care – PPO | Admitting: Occupational Therapy

## 2014-03-22 ENCOUNTER — Inpatient Hospital Stay (HOSPITAL_COMMUNITY): Payer: BC Managed Care – PPO

## 2014-03-22 ENCOUNTER — Encounter (HOSPITAL_COMMUNITY): Payer: BC Managed Care – PPO | Admitting: Occupational Therapy

## 2014-03-22 ENCOUNTER — Encounter (HOSPITAL_COMMUNITY): Payer: BC Managed Care – PPO

## 2014-03-22 LAB — CULTURE, BLOOD (ROUTINE X 2)
Culture: NO GROWTH
Culture: NO GROWTH

## 2014-03-22 LAB — GLUCOSE, CAPILLARY
Glucose-Capillary: 70 mg/dL (ref 70–99)
Glucose-Capillary: 130 mg/dL — ABNORMAL HIGH (ref 70–99)
Glucose-Capillary: 145 mg/dL — ABNORMAL HIGH (ref 70–99)
Glucose-Capillary: 116 mg/dL — ABNORMAL HIGH (ref 70–99)

## 2014-03-22 MED ORDER — GLIMEPIRIDE 1 MG PO TABS
1.0000 mg | ORAL_TABLET | Freq: Two times a day (BID) | ORAL | Status: DC
  Administered 2014-03-22 – 2014-03-26 (×7): 1 mg via ORAL
  Filled 2014-03-22 (×10): qty 1

## 2014-03-22 MED ORDER — CARVEDILOL 12.5 MG PO TABS
12.5000 mg | ORAL_TABLET | Freq: Two times a day (BID) | ORAL | Status: DC
  Administered 2014-03-22 – 2014-03-30 (×16): 12.5 mg via ORAL
  Filled 2014-03-22 (×18): qty 1

## 2014-03-22 MED ORDER — FLUCONAZOLE 100 MG PO TABS
100.0000 mg | ORAL_TABLET | Freq: Every day | ORAL | Status: DC
  Filled 2014-03-22 (×2): qty 1

## 2014-03-22 MED ORDER — GABAPENTIN 100 MG PO CAPS
100.0000 mg | ORAL_CAPSULE | Freq: Two times a day (BID) | ORAL | Status: DC
  Administered 2014-03-22 – 2014-03-30 (×17): 100 mg via ORAL
  Filled 2014-03-22 (×19): qty 1

## 2014-03-22 MED ORDER — NYSTATIN 100000 UNIT/ML MT SUSP
5.0000 mL | Freq: Four times a day (QID) | OROMUCOSAL | Status: AC
  Administered 2014-03-22 – 2014-03-26 (×18): 500000 [IU] via ORAL
  Filled 2014-03-22 (×20): qty 5

## 2014-03-22 NOTE — Progress Notes (Signed)
Physical Therapy Session Note  Patient Details  Name: Carolyn Valenzuela MRN: [id number] Date of Birth: 1951/05/19  Today's Date: 03/22/2014 Time: 1100-1200 Time Calculation (min): 60 min & 50 min  Short Term Goals: Week 1:  PT Short Term Goal 1 (Week 1): STG=LTGs due to ELOS   Skilled Therapeutic Interventions/Progress Updates:  AM Session (60 min): Pt. Received seated in w/c with RLE elevated; pt. Agreeable to therapy. Required toileting prior to session.  1:1 Treatment focused on w/c mgmt and BUE endurance/use during propulsion. Pt. Assist level is (S) with occasional verbal cues for proper technique and sequencing of LLE in conjunction with BUE use. Pt. Performed multilple sit<>stand and sit<>supine transfers with RLE hip flexor stretching in side-lying with therapist facilitated prolonged holds at max A. Pt. Tolerated increased activity and continues to make progress towards goals.  Pain: no c/o pain during session.  Pt. Condition post therapy session: seated in w/c with elevated leg rest with all needs within reach.   PM Session (50 min): Pt. Received seated in w/c and nearly asleep. Pt. Agreeable to therapy.  1:1 Treatment focused on transfers, balance, and ambulation/gait. Pt. Assist level is (S) with RW. Pt. Ambulated >90 feet with one therapeutic rest/recovry break at (s) and w/c follow. Car transfers with RW at (s) with verbal cues during instruction/demonstration of proper technique. Pt. Tolerated therapy with increased fatigue post session; pt. Continues to improve performance and abilities inline with LTGs.  Pain: no c/o pain.  Pt. Condition post therapy session: positioned supine in bed with HOB elevated and RLE elevated on pillow and orthotic donned. Pt. Provided with tray table  with all needs within reach.    Therapy Documentation Precautions:  Precautions Precautions: Fall Required Braces or Orthoses: Knee Immobilizer - Right (custom made) Knee Immobilizer -  Right: On at all times Restrictions Weight Bearing Restrictions: Yes RLE Weight Bearing: Non weight bearing Locomotion : Ambulation Ambulation/Gait Assistance: 5: Supervision Wheelchair Mobility Distance: 300   See FIM for current functional status  Therapy/Group: Individual Therapy  Juluis Mire 03/22/2014, 3:31 PM

## 2014-03-22 NOTE — Discharge Summary (Addendum)
Physician Discharge Summary  Patient ID: Carolyn Valenzuela MRN: [id number] DOB/AGE: Jan 18, 1951 63 y.o.  Admit date: 03/15/2014 Discharge date: 03/22/2014  Admission Diagnoses:  Diabetes, anemia, htn, right foot gangrene, ESRD, sepsis  Discharge Diagnoses:  Principal Problem:   Diabetic wet gangrene of the foot - Right Active Problems:   DIABETES MELLITUS, UNCONTROLLED   HYPERLIPIDEMIA   ANEMIA-IRON DEFICIENCY   HYPERTENSION   SECONDARY HYPERPARATHYROIDISM   End stage renal disease s/p R BKA Sepsis now resolved  Discharged Condition: stable  Hospital Course: Pt was admitted on  7/16 and taken to the OR for R BKA.  She tolerated this procedure well and was tranferred to 5N where she remained for the post op stay.  She required transfusion of 2u PRBCs.  She did well with PT but was felt to be a candidate for inpatient rehab.  She is discharged to CIR for rehab in stable condition.  After amputation of the infected extremity, her sepsis symptoms quickly resolved, and her IV antibiotics were stopped.  Consults: hospitalist, rehab MD  Significant Diagnostic Studies: labs: CBC  Treatments: surgery: as above  Discharge Exam: Blood pressure 171/60, pulse 76, temperature 97.9 F (36.6 C), temperature source Oral, resp. rate 18, height 5' (1.524 m), weight 54.432 kg (120 lb), SpO2 100.00%. cachectic woman in nad.  A and o x4.  R LE dressed and dry s/p BKA.  Disposition: CIR  Discharge Instructions   Call MD / Call 911    Complete by:  As directed   If you experience chest pain or shortness of breath, CALL 911 and be transported to the hospital emergency room.  If you develope a fever above 101 F, pus (white drainage) or increased drainage or redness at the wound, or calf pain, call your surgeon's office.     Constipation Prevention    Complete by:  As directed   Drink plenty of fluids.  Prune juice may be helpful.  You may use a stool softener, such as Colace (over the counter) 100 mg  twice a day.  Use MiraLax (over the counter) for constipation as needed.     Diet - low sodium heart healthy    Complete by:  As directed      Increase activity slowly as tolerated    Complete by:  As directed             Medication List    ASK your doctor about these medications       amLODipine 2.5 MG tablet  Commonly known as:  NORVASC  Take 2.5 mg by mouth 2 (two) times daily.     aspirin 81 MG EC tablet  Take 81 mg by mouth daily.     calcitRIOL 0.5 MCG capsule  Commonly known as:  ROCALTROL  Take 0.5 mcg by mouth daily.     FERROUSUL 325 (65 FE) MG tablet  Generic drug:  ferrous sulfate  Take 325 mg by mouth daily with breakfast.     furosemide 40 MG tablet  Commonly known as:  LASIX  Take 40 mg by mouth 2 (two) times daily.     glimepiride 4 MG tablet  Commonly known as:  AMARYL  Take 4 mg by mouth 2 (two) times daily with a meal.     insulin glargine 100 unit/mL Sopn  Commonly known as:  LANTUS  Inject 10-20 Units into the skin at bedtime. Depending on checked blood sugar     multivitamin with minerals Tabs tablet  Take  1 tablet by mouth daily.     oxyCODONE-acetaminophen 10-325 MG per tablet  Commonly known as:  PERCOCET  Take 1 tablet by mouth every 4 (four) hours as needed for pain.     simvastatin 20 MG tablet  Commonly known as:  ZOCOR  Take 1 tablet (20 mg total) by mouth at bedtime.     sitaGLIPtin 100 MG tablet  Commonly known as:  JANUVIA  Take 100 mg by mouth daily.         SignedWylene Simmer 03/22/2014, 10:35 AM

## 2014-03-22 NOTE — Progress Notes (Signed)
Occupational Therapy Session Notes  Patient Details  Name: Carolyn Valenzuela MRN: [id number] Date of Birth: [birth year]  Today's Date: 03/22/2014 Time: 0810-0900 and 105-145 Time Calculation (min): 50 min (missed 10 minutes due to NT had just set patient up to eat breakfast and pt declined to put aside until after OT) AND 40 min  Short Term Goals: Week 1:  OT Short Term Goal 1 (Week 1): STG=LTGs due to estimated short LOS  Skilled Therapeutic Interventions/Progress Updates:  1)  Patient sitting EOB eating breakfast upon arrival.  NT had just set her up to eat so she did not want to put it aside for later.  Used some of the time she was eating to review her goals for OT and answered her questions from yesterday therapy sessions.  Engaged in self care retraining to include sponge bath (declined shower), dress and groom.  Patient performed bath at EOB and dressing in w/c.  Patient is overall supervision with vcs to min assist with all BADL and functional mobility.  2)  Patient resting in w/c upon arrival.  Engaged in toileting, toilet transfer, groom task at sink, introduction to therapy kitchen using RW, walker safety and care for residual limb.  Issued Corporate treasurer for patient to use when nursing has dressing off so she can inspect the limb and incision.  Also reveiwed amputee magazine related to desensitization. Patient unable to read the small print at this time and uses a magnification glass at home untile she gets "eye surgery"  Therapy Documentation Precautions:  Precautions Precautions: Fall Required Braces or Orthoses: Knee Immobilizer - Right (custom made) Knee Immobilizer - Right: On at all times Restrictions Weight Bearing Restrictions: Yes RLE Weight Bearing: Non weight bearing Pain: 1)  7/10 right residual limb, premedicated, rest and repositioned 2)  Denies pain ADL: See FIM for current functional status  Therapy/Group: Individual Therapy both sessions  [place],  [place] 03/22/2014, 10:31 AM

## 2014-03-22 NOTE — Progress Notes (Signed)
[facility] PHYSICAL MEDICINE & REHABILITATION     PROGRESS NOTE    Subjective/Complaints: Having some stump but mostly phantom pain. Participated in therapy without issue yesterday Objective: Vital Signs: Blood pressure 190/82, pulse 84, temperature 100.1 F (37.8 C), temperature source Oral, resp. rate 18, height 5' (1.524 m), weight 54.5 kg (120 lb 2.4 oz), SpO2 96.00%. No results found.  Recent Labs  03/21/14 0627  WBC 13.4*  HGB 8.3*  HCT 25.8*  PLT 531*    Recent Labs  03/21/14 0627  NA 133*  K 4.9  CL 96  GLUCOSE 117*  BUN 57*  CREATININE 5.34*  CALCIUM 9.7   CBG (last 3)   Recent Labs  03/21/14 1112 03/21/14 1621 03/21/14 2114  GLUCAP 184* 194* 171*    Wt Readings from Last 3 Encounters:  03/21/14 54.5 kg (120 lb 2.4 oz)  03/19/14 54.432 kg (120 lb)  03/19/14 54.432 kg (120 lb)    Physical Exam:  Constitutional: She is oriented to person, place, and time.  HENT: oral mucosa pink missing teeth  Head: Normocephalic.  Eyes: EOM are normal.  Neck: Normal range of motion. Neck supple. No thyromegaly present.  Cardiovascular: Normal rate and regular rhythm. No murmur or rub  Respiratory: Effort normal and breath sounds normal. No respiratory distress.  GI: Soft. Bowel sounds are normal. She exhibits no distension. Non-tender Musculoskeletal:  Right BK wound is dressed. Has excellent knee ROM. Knee is expectedly tender.  Neurological: She is alert and oriented to person, place, and time.  UE 5/5 bilaterally delt, bicep, tricep, HI. LLE: 4- HF, 4/5 KE to 4/5 at ankle. Able to lift RLE agst gravity with ACE in place. No sensory deficits noted.  Skin:  BKA site clean. No drainage. shrinker too tight. Calluses noted over the left first toe and, metatarsal area, 5th toe dry/hardened also. No specific breakdown seen.  Psychiatric: She has a normal mood and affect. Her behavior is normal. Judgment and thought content normal    Assessment/Plan: 1.  Functional deficits secondary to right BKA which require 3+ hours per day of interdisciplinary therapy in a comprehensive inpatient rehab setting. Physiatrist is providing close team supervision and 24 hour management of active medical problems listed below. Physiatrist and rehab team continue to assess barriers to discharge/monitor patient progress toward functional and medical goals. FIM: FIM - Bathing Bathing Steps Patient Completed: Chest;Right Arm;Left Arm;Abdomen;Front perineal area;Buttocks;Right upper leg;Left upper leg;Left lower leg (including foot) Bathing: 4: Steadying assist (sit EOB and stand)  FIM - Upper Body Dressing/Undressing Upper body dressing/undressing steps patient completed: Thread/unthread right bra strap;Thread/unthread left bra strap;Hook/unhook bra;Thread/unthread right sleeve of pullover shirt/dresss;Thread/unthread left sleeve of pullover shirt/dress;Put head through opening of pull over shirt/dress;Pull shirt over trunk Upper body dressing/undressing: 5: Set-up assist to: Obtain clothing/put away FIM - Lower Body Dressing/Undressing Lower body dressing/undressing steps patient completed: Thread/unthread right underwear leg;Thread/unthread left underwear leg;Pull underwear up/down;Thread/unthread right pants leg;Thread/unthread left pants leg;Don/Doff left sock;Don/Doff left shoe Lower body dressing/undressing: 4: Min-Patient completed 75 plus % of tasks  FIM - Toileting Toileting steps completed by patient: Adjust clothing prior to toileting;Adjust clothing after toileting;Performs perineal hygiene Toileting Assistive Devices: Grab bar or rail for support;Toilet Aid/prosthesis/orthosis Toileting: 4: Steadying assist  FIM - Radio producer Devices: Walker (3 in 1 over commode) Toilet Transfers: 4-To toilet/BSC: Min A (steadying Pt. > 75%);4-From toilet/BSC: Min A (steadying Pt. > 75%)  FIM - Buyer, retail Devices: Walker;Bed rails;Arm rests Bed/Chair  Transfer: 0: Activity did not occur  FIM - Locomotion: Wheelchair Distance: 150 Locomotion: Wheelchair: 5: Travels 150 ft or more: maneuvers on rugs and over door sills with supervision, cueing or coaxing FIM - Locomotion: Ambulation Locomotion: Ambulation Assistive Devices: Web designer Ambulation/Gait Assistance: 5: Supervision Locomotion: Ambulation: 1: Travels less than 50 ft with supervision/safety issues  Comprehension Comprehension Mode: Auditory Comprehension: 5-Understands complex 90% of the time/Cues < 10% of the time  Expression Expression Mode: Verbal Expression: 5-Expresses complex 90% of the time/cues < 10% of the time  Social Interaction Social Interaction: 7-Interacts appropriately with others - No medications needed.  Problem Solving Problem Solving: 5-Solves complex 90% of the time/cues < 10% of the time  Memory Memory: 5-Recognizes or recalls 90% of the time/requires cueing < 10% of the time  Medical Problem List and Plan:  1. Functional deficits secondary to right BKA secondary to gangrenous changes 03/16/2014   -use ACE wrap for night---shrinker too tight 2. DVT Prophylaxis/Anticoagulation: SCDs left lower extremity. Monitor for any signs of DVT  3. Pain Management: Baclofen 10 mg every 8 hours as needed muscle spasms, Hydrocodone as needed. Monitor with increased mobility  4. acute on chronic blood loss anemia. Patient has been transfused. Continue iron supplement. Followup CBC  5. Neuropsych: This patient is capable of making decisions on his own behalf.  6. Hypertension. Norvasc 10 mg daily, Coreg 6.25 mg twice a day, Lasix 40 mg twice a day. Monitor with increased mobility   -increase coreg to 12.5mg  bid 7. Diabetes mellitus with peripheral neuropathy. Hemoglobin A1c 8.2. Lantus insulin 20 units daily. Check blood sugars a.c. and at bedtime.   -restart amaryl at 1mg  bid (on 4mg  bid at home) 8.  Chronic renal insufficiency. Baseline creatinine 6.42. Patient currently not on dialysis followed outpatient by renal services Dr. Moshe Cipro.   -Cr/BUN near baseline 9. Hyperlipidemia. Zocor 10. Leukocytosis---WBC trending down but had low grade temp  -check urine  -wound looks good, chest clear  LOS (Days) 2 A FACE TO FACE EVALUATION WAS PERFORMED  SWARTZ,ZACHARY T 03/22/2014 8:06 AM

## 2014-03-22 NOTE — Care Management Note (Signed)
Inpatient Rehabilitation Center Individual Statement of Services  Patient Name:  Carolyn Valenzuela  Date:  03/22/2014  Welcome to the [facility].  Our goal is to provide you with an individualized program based on your diagnosis and situation, designed to meet your specific needs.  With this comprehensive rehabilitation program, you will be expected to participate in at least 3 hours of rehabilitation therapies Monday-Friday, with modified therapy programming on the weekends.  Your rehabilitation program will include the following services:  Physical Therapy (PT), Occupational Therapy (OT), 24 hour per day rehabilitation nursing, Therapeutic Recreaction (TR), Case Management (Social Worker), Rehabilitation Medicine, Nutrition Services and Pharmacy Services  Weekly team conferences will be held on Tuesdays to discuss your progress.  Your Social Worker will talk with you frequently to get your input and to update you on team discussions.  Team conferences with you and your family in attendance may also be held.  Expected length of stay: 7 days  Overall anticipated outcome: modified independent  Depending on your progress and recovery, your program may change. Your Social Worker will coordinate services and will keep you informed of any changes. Your Social Worker's name and contact numbers are listed  below.  The following services may also be recommended but are not provided by the [facility] will be made to provide these services after discharge if needed.  Arrangements include referral to agencies that provide these services.  Your insurance has been verified to be:  [place] Your primary doctor is:  Dr. Cathlean Cower  Pertinent information will be shared with your doctor and your insurance company.  Social Worker:   [place], Home or ([phone]   Information discussed with and copy given to patient by: Lennart Pall, 03/22/2014, 2:46 PM

## 2014-03-22 NOTE — Progress Notes (Signed)
Social Work  Social Work Assessment and Plan  Patient Details  Name: Carolyn Valenzuela MRN: [id number] Date of Birth: Jun 03, 1951  Today's Date: 03/22/2014  Problem List:  Patient Active Problem List   Diagnosis Date Noted  . S/P BKA (below knee amputation) 03/20/2014  . 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 Critical lower limb ischemia 01/09/2014  . Right foot ulcer 11/07/2013  . Toe pain 09/06/2013  . Pre-ulcerative corn or callous 09/06/2013  . Lower extremity pain 09/06/2013  . End stage renal disease 04/06/2012  . Left low back pain 12/08/2011  . Hip bursitis, left 12/08/2011  . Left leg pain 12/08/2011  . Abdominal pain, bilateral lower quadrant 07/08/2011  . Preventative health care 12/08/2010  . Allergic rhinitis, cause unspecified 12/08/2010  . DIABETES MELLITUS, UNCONTROLLED 05/21/2009  . SECONDARY HYPERPARATHYROIDISM 05/21/2009  . NUMBNESS 05/21/2009  . BACK PAIN 05/02/2009  . ANEMIA-IRON DEFICIENCY 01/25/2008  . HYPERTENSION 01/25/2008  . CERVICAL RADICULOPATHY, LEFT 01/25/2008  . HYPERLIPIDEMIA 03/30/2007   Past Medical History:  Past Medical History  Diagnosis Date  . DIABETES MELLITUS, UNCONTROLLED 05/21/2009  . HYPERLIPIDEMIA 03/30/2007  . ANEMIA-IRON DEFICIENCY 01/25/2008  . HYPERTENSION 01/25/2008  . SINUSITIS- ACUTE-NOS 01/25/2008  . SECONDARY HYPERPARATHYROIDISM 05/21/2009  . RENAL INSUFFICIENCY 02/01/2008  . CERVICAL RADICULOPATHY, LEFT 01/25/2008  . BACK PAIN 05/02/2009  . NUMBNESS 05/21/2009  . Allergic rhinitis, cause unspecified 12/08/2010  . Foot ulcer     right lateral malleolus  . Critical lower limb ischemia    Past Surgical History:  Past Surgical History  Procedure Laterality Date  . Av fistula placement Left   . Eye surgery Bilateral     cataracts removed, left eye still has some oil in it.  . Colonoscopy    . Amputation Right 03/15/2014    Procedure: RIGHT  LEG  BELOW KNEE AMPUTATION ;  Surgeon: [name], MD;  Location: [facility];  Service: Orthopedics;  Laterality: Right;   Social History:  reports that she has never smoked. She has never used smokeless tobacco. She reports that she does not drink alcohol or use illicit drugs.  Family / Support Systems Marital Status: Single Patient Roles: Other (Comment) (Has 2 sisters who assist.) Spouse/Significant Other: NA Children: NA Other Supports: sister, [name] @ [phone] or (C) [phone];  sister, [name] @ [phone];  brother, Armonie Falkner @ [phone] Anticipated Caregiver: [name] Ability/Limitations of Caregiver: No limitations. Sister to alternate assistance for pt Caregiver Availability: 24/7 Family Dynamics: pt describes very good support from sisters   Social History Preferred language: English Religion: Holiness Cultural Background: NA Education: HS Read: Yes Write: Yes Employment Status: Employed Name of Employer: [employer] at [employer] of Employment: 10 (yrs) Return to Work Plans: pt plans to retire now Freight forwarder Issues: none Guardian/Conservator: none - per MD, pt capable of making decisions on her own behalf   Abuse/Neglect Physical Abuse: Denies Verbal Abuse: Denies Sexual Abuse: Denies Exploitation of patient/patient's resources: Denies Self-Neglect: Denies  Emotional Status Pt's affect, behavior adn adjustment status: Pt very pleasant and highly motivated to regain her independence.  She denies any s/s of depression or anxiety.  Regarding emotional adjustment to loss of limb, pt states, "I'm doing just fine.  No worries at all.  I will get through this." Recent Psychosocial Issues: None Pyschiatric History: None Substance Abuse History: None  Patient / Family Perceptions, Expectations & Goals Pt/Family understanding of illness &  functional limitations: Pt with good understanding of medical issues which resulted in need for BKA.  Good understanding of her functional  limitations and need for CIR. Premorbid pt/family roles/activities: Pt was completely independent and working f/t.   Anticipated changes in roles/activities/participation: family may need to assume some caregiver duties, however, goals set for mod i  Pt/family expectations/goals: Pt states her primary goals is to "get home and do for myself"  [employer]: None Premorbid Home Care/DME Agencies: None Transportation available at discharge: yes Resource referrals recommended: Support group (specify)  Discharge Planning Living Arrangements: Alone Support Systems: Other relatives Type of Residence: Private residence Insurance Resources: Multimedia programmer (specify) Nurse, mental health) Financial Resources: Employment Museum/gallery curator Screen Referred: No Living Expenses: Education officer, community Management: Patient Does the patient have any problems obtaining your medications?: No Home Management: pt Patient/Family Preliminary Plans: pt plans to return to her own apt with sisters to stay as needed and assist Social Work Anticipated Follow Up Needs: HH/OP;Support Group Expected length of stay: 7 days  Clinical Impression Very pleasant woman here following a BKA.  Motivated to regain her full independence and return home ASAP.  Good family support with sisters able to provide 24/7 assist if needed. No s/s of depression or anxiety for pt - will monitor.  Will follow for support and d/c planning.  Saki Legore 03/22/2014, 3:48 PM

## 2014-03-23 ENCOUNTER — Inpatient Hospital Stay (HOSPITAL_COMMUNITY): Payer: BC Managed Care – PPO

## 2014-03-23 ENCOUNTER — Inpatient Hospital Stay (HOSPITAL_COMMUNITY): Payer: BC Managed Care – PPO | Admitting: Speech Pathology

## 2014-03-23 DIAGNOSIS — E1165 Type 2 diabetes mellitus with hyperglycemia: Secondary | ICD-10-CM

## 2014-03-23 DIAGNOSIS — I10 Essential (primary) hypertension: Secondary | ICD-10-CM

## 2014-03-23 DIAGNOSIS — S88119A Complete traumatic amputation at level between knee and ankle, unspecified lower leg, initial encounter: Secondary | ICD-10-CM | POA: Diagnosis not present

## 2014-03-23 DIAGNOSIS — IMO0001 Reserved for inherently not codable concepts without codable children: Secondary | ICD-10-CM | POA: Diagnosis not present

## 2014-03-23 DIAGNOSIS — L98499 Non-pressure chronic ulcer of skin of other sites with unspecified severity: Secondary | ICD-10-CM

## 2014-03-23 DIAGNOSIS — N186 End stage renal disease: Secondary | ICD-10-CM

## 2014-03-23 DIAGNOSIS — I739 Peripheral vascular disease, unspecified: Secondary | ICD-10-CM

## 2014-03-23 LAB — GLUCOSE, CAPILLARY
Glucose-Capillary: 30 mg/dL — CL (ref 70–99)
Glucose-Capillary: 63 mg/dL — ABNORMAL LOW (ref 70–99)
Glucose-Capillary: 79 mg/dL (ref 70–99)
Glucose-Capillary: 153 mg/dL — ABNORMAL HIGH (ref 70–99)
Glucose-Capillary: 178 mg/dL — ABNORMAL HIGH (ref 70–99)
Glucose-Capillary: 183 mg/dL — ABNORMAL HIGH (ref 70–99)
Glucose-Capillary: 154 mg/dL — ABNORMAL HIGH (ref 70–99)

## 2014-03-23 MED ORDER — GLUCOSE 40 % PO GEL
ORAL | Status: AC
  Administered 2014-03-23: 08:00:00
  Filled 2014-03-23: qty 1

## 2014-03-23 MED ORDER — INSULIN GLARGINE 100 UNIT/ML ~~LOC~~ SOLN
10.0000 [IU] | Freq: Every day | SUBCUTANEOUS | Status: DC
  Filled 2014-03-23 (×2): qty 0.1

## 2014-03-23 NOTE — Progress Notes (Signed)
Physical Therapy Session Note  Patient Details  Name: Carolyn Valenzuela MRN: [id number] Date of Birth: December 08, 1950  Today's Date: 03/23/2014 Time: 1100-1206 & [id number] Time Calculation (min): 66 min & 62 min   Short Term Goals: Week 1:  PT Short Term Goal 1 (Week 1): STG=LTGs due to ELOS  Skilled Therapeutic Interventions/Progress Updates:  AM Session (66 min):  Pt engaged in w/c mgmt in community environment. Pt completed all tasks at supervision level. Pt navigated controlled and uncontrolled surfaces with min guard A after explanation, demonstration, and during teach-back skill aquisition. Pt. Requires frequent therapeutic rest/recovery breaks due to deconditioned BUEs. Pt. Ambulated 60 feet with RW with frequent seated rest/recovery breaks. Focus on activity tolerance, conditioning, transfers, and safety awareness.  Pt seated in w/c with food tray provided with all needs within reach.   PM Session (62 min):  Pt engaged in w/c mgmt in community environment with increased obstacles and uncontrolled surfaces. Pt completed all tasks at supervision to min A due to fatigue. Pt. Requires frequent therapeutic rest/recovery breaks due to deconditioned BUEs. Pt. Ambulated 90 feet with RW with frequent seated rest/recovery breaks. Focus on activity tolerance, conditioning, transfers, and safety awareness. Pt seated in w/c with food tray provided with all needs within reach.  Pain: RLE residual limb throbbing without rating. Pt. Repositioned and rest/recovery break decreased discomfort.  Pt. States she is contacting apartment handyman about installing short ramp to navigate 2 steps after having difficulty and increased fear of home STE.  Therapy Documentation Precautions:  Precautions Precautions: Fall Required Braces or Orthoses: Knee Immobilizer - Right (custom made) Knee Immobilizer - Right: On at all times Restrictions Weight Bearing Restrictions: Yes RLE Weight Bearing: Non weight  bearing  Locomotion : Ambulation Ambulation/Gait Assistance: 5: Supervision Wheelchair Mobility Distance: 400   See FIM for current functional status  Therapy/Group: Individual Therapy  Juluis Mire 03/23/2014, 2:42 PM

## 2014-03-23 NOTE — IPOC Note (Signed)
Overall Plan of Care [facility]) Patient Details Name: Carolyn Valenzuela MRN: [id number] DOB: Dec 24, 1950  Admitting Diagnosis: RT BKA  Hospital Problems: Active Problems:   S/P BKA (below knee amputation)     Functional Problem List: Nursing Bowel;Medication Management;Nutrition;Motor;Pain;Safety;Skin Integrity  PT Balance;Edema;Endurance;Motor;Pain;Safety;Skin Integrity  OT Balance;Endurance;Safety;Skin Integrity  SLP    TR         Basic ADL's: OT Grooming;Bathing;Dressing;Toileting     Advanced  ADL's: OT Simple Meal Preparation;Light Housekeeping     Transfers: PT Bed Mobility;Bed to Chair;Car;Furniture  OT Toilet;Tub/Shower     Locomotion: PT Ambulation;Wheelchair Mobility;Stairs     Additional Impairments: OT    SLP        TR      Anticipated Outcomes Item Anticipated Outcome  Self Feeding    Swallowing      Basic self-care  Mod I  Toileting  Mod I   Bathroom Transfers Mod I toilet, Supervision tub bench  Bowel/Bladder  Bowel and bladder with minimal assistance  Transfers  mod (I) w/ bed mobility and stand pivot transfer bed <> w/c w/ RW  Locomotion  (S) for stairs, mod (I) w/ RW in home environment, mod (I) w/c level in community  Communication     Cognition     Pain  Pain level 3 or less on a scale of 0-10.  Safety/Judgment  Safety/judgement with minimal assistance   Therapy Plan: PT Intensity: Minimum of 1-2 x/day ,45 to 90 minutes PT Frequency: 5 out of 7 days PT Duration Estimated Length of Stay: 7 days OT Intensity: Minimum of 1-2 x/day, 45 to 90 minutes OT Frequency: 5 out of 7 days OT Duration/Estimated Length of Stay: 7 days         Team Interventions: Nursing Interventions Patient/Family Education;Bowel Management;Disease Management/Prevention;Pain Management;Medication Management;Skin Care/Wound Management;Discharge Planning;Psychosocial Support  PT interventions Ambulation/gait training;Balance/vestibular training;Discharge  planning;DME/adaptive equipment instruction;Functional mobility training;Patient/family education;Pain management;Therapeutic Activities;Therapeutic Exercise;UE/LE Strength taining/ROM  OT Interventions Balance/vestibular training;Community reintegration;DME/adaptive equipment instruction;Disease mangement/prevention;Discharge planning;Functional mobility training;Psychosocial support;Patient/family education;Pain management;Self Care/advanced ADL retraining;Skin care/wound managment;Therapeutic Activities;Therapeutic Exercise;UE/LE Strength taining/ROM  SLP Interventions    TR Interventions    SW/CM Interventions Discharge Planning;Psychosocial Support;Patient/Family Education    Team Discharge Planning: Destination: PT-Home ,OT- Home , SLP-  Projected Follow-up: PT-Outpatient PT (when ready for prosthetic fitting), OT-  Home health OT, SLP-  Projected Equipment Needs: PT-Rolling walker with 5" wheels;Wheelchair cushion (measurements);Wheelchair (measurements), OT- 3 in 1 bedside comode;Tub/shower seat, SLP-  Equipment Details: PT- , OT-  Patient/family involved in discharge planning: PT- Patient,  OT-Patient, SLP-   MD ELOS: ELOS: 9-12 days   Medical Rehab Prognosis:  Good Assessment: 63 y.o. right-handed female with history of poorly controlled diabetes mellitus with peripheral neuropathy, chronic anemia requiring multiple outpatient iron transfusions, chronic renal insufficiency with baseline creatinine 6.42 currently not on dialysis followed by renal services. Patient was independent with a cane prior to admission living alone with good supportive of family. Presented 03/15/2014 with progressive gangrenous ischemic changes right lower extremity. Wound was not felt to be salvageable and underwent right below-knee amputation 03/16/2014 per Dr. Doran Durand. Postoperative pain management. Acute blood loss anemia 5.8 and transfused with  latest hemoglobin 8.6    Now requiring 24/7 Rehab RN,MD, as well  as CIR level PT, OT and SLP.  Treatment team will focus on ADLs and mobility with goals set at Mod I  See Team Conference Notes for weekly updates to the plan of care

## 2014-03-23 NOTE — Progress Notes (Signed)
Hypoglycemic Event  CBG: 63  Treatment: 15 GM carbohydrate snack  Symptoms: None  Follow-up CBG: Time:0758 CBG Result:79  Possible Reasons for Event: Unknown  Comments/MD notified: Dr. Naaman Plummer made aware    Donnelly Stager  Remember to initiate Hypoglycemia Order Set & complete

## 2014-03-23 NOTE — Progress Notes (Signed)
[facility]IC[facility] RE[facility]ILITATION     PROGRESS NOTE    Subjective/Complaints: Pain better controlled. Low cbg this am (30). Doesn't feel too bad. Nurse giving her glucose gel Objective: Vital Signs: Blood pressure 139/64, pulse 78, temperature 98.7 F (37.1 C), temperature source Oral, resp. rate 18, height 5' (1.524 m), weight 54.5 kg (120 lb 2.4 oz), SpO2 95.00%. No results found.  Recent Labs  03/21/14 0627  WBC 13.4*  HGB 8.3*  HCT 25.8*  PLT 531*    Recent Labs  03/21/14 0627  NA 133*  K 4.9  CL 96  GLUCOSE 117*  BUN 57*  CREATININE 5.34*  CALCIUM 9.7   CBG (last 3)   Recent Labs  03/22/14 2101 03/23/14 0711 03/23/14 0734  GLUCAP 116* 30* 63*    Wt Readings from Last 3 Encounters:  03/21/14 54.5 kg (120 lb 2.4 oz)  03/19/14 54.432 kg (120 lb)  03/19/14 54.432 kg (120 lb)    Physical Exam:  Constitutional: She is oriented to person, place, and time.  HENT: oral mucosa pink missing teeth  Head: Normocephalic.  Eyes: EOM are normal.  Neck: Normal range of motion. Neck supple. No thyromegaly present.  Cardiovascular: Normal rate and regular rhythm. No murmur or rub  Respiratory: Effort normal and breath sounds normal. No respiratory distress.  GI: Soft. Bowel sounds are normal. She exhibits no distension. Non-tender Musculoskeletal:  Right BK wound is dressed.  Still a little tender.  Neurological: She is alert and oriented to person, place, and time.  UE 5/5 bilaterally delt, bicep, tricep, HI. LLE: 4- HF, 4/5 KE to 4/5 at ankle. Able to lift RLE agst gravity with ACE in place. No sensory deficits noted.  Skin:  BKA site clean. No drainage.   Calluses noted over the left first toe and, metatarsal area, 5th toe dry/hardened also. No specific breakdown seen.  Psychiatric: She has a normal mood and affect. Her behavior is normal. Judgment and thought content normal    Assessment/Plan: 1. Functional deficits secondary to right BKA which  require 3+ hours per day of interdisciplinary therapy in a comprehensive inpatient rehab setting. Physiatrist is providing close team supervision and 24 hour management of active medical problems listed below. Physiatrist and rehab team continue to assess barriers to discharge/monitor patient progress toward functional and medical goals. FIM: FIM - Bathing Bathing Steps Patient Completed: Chest;Right Arm;Left Arm;Abdomen;Front perineal area;Buttocks;Right upper leg;Left upper leg;Left lower leg (including foot) Bathing: 4: Steadying assist (sit EOB and stand)  FIM - Upper Body Dressing/Undressing Upper body dressing/undressing steps patient completed: Thread/unthread right bra strap;Thread/unthread left bra strap;Hook/unhook bra;Thread/unthread right sleeve of pullover shirt/dresss;Thread/unthread left sleeve of pullover shirt/dress;Put head through opening of pull over shirt/dress;Pull shirt over trunk Upper body dressing/undressing: 5: Set-up assist to: Obtain clothing/put away FIM - Lower Body Dressing/Undressing Lower body dressing/undressing steps patient completed: Thread/unthread right underwear leg;Thread/unthread left underwear leg;Pull underwear up/down;Thread/unthread right pants leg;Thread/unthread left pants leg;Don/Doff left sock;Don/Doff left shoe Lower body dressing/undressing: 4: Min-Patient completed 75 plus % of tasks  FIM - Toileting Toileting steps completed by patient: Adjust clothing prior to toileting;Adjust clothing after toileting;Performs perineal hygiene Toileting Assistive Devices: Grab bar or rail for support;Toilet Aid/prosthesis/orthosis Toileting: 4: Steadying assist  FIM - Radio producer Devices: Recruitment consultant Transfers: 4-To toilet/BSC: Min A (steadying Pt. > 75%);4-From toilet/BSC: Min A (steadying Pt. > 75%)  FIM - Control and instrumentation engineer Devices: Walker;Bed rails Bed/Chair Transfer: 5: Supine  > Sit:  Supervision (verbal cues/safety issues);5: Sit > Supine: Supervision (verbal cues/safety issues);5: Bed > Chair or W/C: Supervision (verbal cues/safety issues);5: Chair or W/C > Bed: Supervision (verbal cues/safety issues)  FIM - Locomotion: Wheelchair Distance: 300 Locomotion: Wheelchair: 6: Travels 150 ft or more, turns around, maneuvers to table, bed or toilet, negotiates 3% grade: maneuvers on rugs and over door sills independently FIM - Locomotion: Ambulation Locomotion: Ambulation Assistive Devices: Web designer Ambulation/Gait Assistance: 5: Supervision Locomotion: Ambulation: 2: Travels 50 - 149 ft with supervision/safety issues  Comprehension Comprehension Mode: Auditory Comprehension: 5-Understands basic 90% of the time/requires cueing < 10% of the time  Expression Expression Mode: Verbal Expression: 5-Expresses basic 90% of the time/requires cueing < 10% of the time.  Social Interaction Social Interaction: 7-Interacts appropriately with others - No medications needed.  Problem Solving Problem Solving: 5-Solves complex 90% of the time/cues < 10% of the time  Memory Memory: 5-Recognizes or recalls 90% of the time/requires cueing < 10% of the time  Medical Problem List and Plan:  1. Functional deficits secondary to right BKA secondary to gangrenous changes 03/16/2014   -use ACE wrap for night---shrinker too tight 2. DVT Prophylaxis/Anticoagulation: SCDs left lower extremity. Monitor for any signs of DVT  3. Pain Management: Baclofen 10 mg every 8 hours as needed muscle spasms, Hydrocodone as needed. Monitor with increased mobility  4. acute on chronic blood loss anemia. Patient has been transfused. Continue iron supplement. Followup CBC  5. Neuropsych: This patient is capable of making decisions on his own behalf.  6. Hypertension. Norvasc 10 mg daily, Coreg 6.25 mg twice a day, Lasix 40 mg twice a day. Monitor with increased mobility   -increased coreg to  12.5mg  bid---observe for effect 7. Diabetes mellitus with peripheral neuropathy. Hemoglobin A1c 8.2. Lantus insulin 20 units daily. Check blood sugars a.c. and at bedtime.   -restart amaryl at 1mg  bid (on 4mg  bid at home)  -reduce lantus to 10u given low cbg 8. Chronic renal insufficiency. Baseline creatinine 6.42. Patient currently not on dialysis followed outpatient by renal services Dr. Moshe Cipro.   -Cr/BUN near baseline 9. Hyperlipidemia. Zocor 10. Leukocytosis---WBC trending down    -urine cx neg  -wound looks good, chest clear  LOS (Days) 3 A FACE TO FACE EVALUATION WAS PERFORMED  Gemayel Mascio T 03/23/2014 7:44 AM

## 2014-03-23 NOTE — Progress Notes (Signed)
Occupational Therapy Session Note  Patient Details  Name: Carolyn Valenzuela MRN: [id number] Date of Birth: [birth year]  Today's Date: 03/23/2014 Time: 0830-0930 Time Calculation (min): 60 min  Short Term Goals: Week 1:  OT Short Term Goal 1 (Week 1): STG=LTGs due to estimated short LOS  Skilled Therapeutic Interventions/Progress Updates:  ADL-retraining at shower level with emphasis on transfers, effective use of DME, and dynamic sitting/standing balance.   Pt received in bed, receptive for planned activity.  Pt reported need to toilet with evidence of incontinence of bowel in bed.   Pt completed bed mobility with min guard assist, transferred to w/c with min assist (steadying) using RW and ambulated to toilet with min guard.   Pt completed toileting with extra time and min assist for hygiene and proceeded to shower chair with instructional cues for transfer and steadying assist during transfer.   Pt completed bath unassisted, transferred back to w/c with min guard and was setup to complete dressing with RN tech to assist with completion of lower body dressing due to end of session.    Therapy Documentation Precautions:  Precautions Precautions: Fall Required Braces or Orthoses: Knee Immobilizer - Right (custom made) Knee Immobilizer - Right: On at all times Restrictions Weight Bearing Restrictions: Yes RLE Weight Bearing: Non weight bearing  Pain: No/denies pain   See FIM for current functional status  Therapy/Group: Individual Therapy  Second session: Time: 1400-1430 Time Calculation (min):  30 min  Pain Assessment: No/denies pain  Skilled Therapeutic Interventions: ADL-retraining  With focus on toilet transfers, toileting (clothing management and hygiene), static/dynamic standing balance, bed transfer and bed mobility using AE/DME.   Pt received in w/c after physical therapy session.   Pt reported needed to toilet (to urinate).   OT encouraged pt to ambulate to bathroom, complete  transfer and toileting with assist as needed.   With min guard assist pt ambulated using hop-step gait and extra time.   Pt completed transfer and clothing management unassisted but OT noted soiled underwear which required mod assist to fully remove clothing from orthosis at RLE while pt remained seated.   Pt was unable to void successfully and requested return to bed.   Pt was directed to ambulate to bed and complete bed mobility using transfer techniques as instructed.   Pt completed task with min verbal cues for technique and mod assist to lift left leg into bed during bed mobility.      See FIM for current functional status  Therapy/Group: Individual Therapy  Maili 03/23/2014, 2:34 PM

## 2014-03-23 NOTE — Progress Notes (Signed)
Hypoglycemic Event  CBG: 30  Treatment: 1 tube instant glucose  Symptoms: None  Follow-up CBG: Time:0734 CBG Result:63  Possible Reasons for Event: Unknown  Comments/MD notified:0715 am Made Dr. Naaman Plummer aware    Carolyn Valenzuela, Carolyn Valenzuela  Remember to initiate Hypoglycemia Order Set & complete

## 2014-03-24 ENCOUNTER — Inpatient Hospital Stay (HOSPITAL_COMMUNITY): Payer: BC Managed Care – PPO | Admitting: *Deleted

## 2014-03-24 ENCOUNTER — Inpatient Hospital Stay (HOSPITAL_COMMUNITY): Payer: BC Managed Care – PPO | Admitting: Occupational Therapy

## 2014-03-24 ENCOUNTER — Inpatient Hospital Stay (HOSPITAL_COMMUNITY): Payer: BC Managed Care – PPO | Admitting: Physical Therapy

## 2014-03-24 DIAGNOSIS — IMO0001 Reserved for inherently not codable concepts without codable children: Secondary | ICD-10-CM

## 2014-03-24 DIAGNOSIS — N186 End stage renal disease: Secondary | ICD-10-CM

## 2014-03-24 DIAGNOSIS — I739 Peripheral vascular disease, unspecified: Secondary | ICD-10-CM

## 2014-03-24 DIAGNOSIS — I10 Essential (primary) hypertension: Secondary | ICD-10-CM

## 2014-03-24 DIAGNOSIS — E1165 Type 2 diabetes mellitus with hyperglycemia: Secondary | ICD-10-CM

## 2014-03-24 DIAGNOSIS — S88119A Complete traumatic amputation at level between knee and ankle, unspecified lower leg, initial encounter: Secondary | ICD-10-CM

## 2014-03-24 DIAGNOSIS — L98499 Non-pressure chronic ulcer of skin of other sites with unspecified severity: Secondary | ICD-10-CM

## 2014-03-24 LAB — GLUCOSE, CAPILLARY
Glucose-Capillary: 39 mg/dL — CL (ref 70–99)
Glucose-Capillary: 45 mg/dL — ABNORMAL LOW (ref 70–99)
Glucose-Capillary: 65 mg/dL — ABNORMAL LOW (ref 70–99)
Glucose-Capillary: 132 mg/dL — ABNORMAL HIGH (ref 70–99)
Glucose-Capillary: 48 mg/dL — ABNORMAL LOW (ref 70–99)
Glucose-Capillary: 96 mg/dL (ref 70–99)
Glucose-Capillary: 141 mg/dL — ABNORMAL HIGH (ref 70–99)
Glucose-Capillary: 262 mg/dL — ABNORMAL HIGH (ref 70–99)

## 2014-03-24 LAB — GLUCOSE, RANDOM: Glucose, Bld: 89 mg/dL (ref 70–99)

## 2014-03-24 MED ORDER — GLUCOSE 40 % PO GEL
ORAL | Status: AC
  Administered 2014-03-24: 37.5 g
  Filled 2014-03-24: qty 1

## 2014-03-24 NOTE — Progress Notes (Signed)
Patient's am CBG check was 39 , rechecked and second result was 45, lab order for glucose placed per protocol. Patient a/a/ox3 no nausea or vomiting noted. Glucagon given per order/ protocol, then sugar finally reached 135, md aware and new orders written.

## 2014-03-24 NOTE — Progress Notes (Signed)
Occupational Therapy Session Note  Patient Details  Name: HADE DIMITRIOU MRN: [id number] Date of Birth: 01/27/1951  Today's Date: 03/24/2014 Time: 1130-1200 Time Calculation (min): 30 min   Skilled Therapeutic Interventions/Progress Updates:  Patient completed W/c push ups and therapeutic exercises w/c level to increase safety and functional mobility for self care and activities     Therapy Documentation Precautions:  Precautions Precautions: Fall Required Braces or Orthoses: Knee Immobilizer - Right (custom made) Knee Immobilizer - Right: On at all times Restrictions Weight Bearing Restrictions: Yes RLE Weight Bearing: Non weight bearing  Pain: denied   See FIM for current functional status  Therapy/Group: Individual Therapy  Alfredia Ferguson [facility] 03/24/2014, 3:18 PM

## 2014-03-24 NOTE — Progress Notes (Addendum)
Physical Therapy Session Note  Patient Details  Name: Carolyn Valenzuela MRN: [id number] Date of Birth: [birth year]  Today's Date: 03/24/2014 Time: 1301-1401 Time Calculation (min): 60 min  Short Term Goals: Week 1:  PT Short Term Goal 1 (Week 1): STG=LTGs due to ELOS  Skilled Therapeutic Interventions/Progress Updates:    Pt limited in gait activities in session secondary to feeling very weak today. RN aware and pt with lower blood sugar earlier in day but no contraindications to therapy. Pt benefits from cues for weight shift in session for increased glute clearance on mat with transfers without UE support. Pt also benefits from cues for ventilation to avoid valsalva with functional activities, although further reinforcement required.   Therapy Documentation Precautions:  Precautions Precautions: Fall Required Braces or Orthoses: Knee Immobilizer - Right Knee Immobilizer - Right: On at all times Restrictions Weight Bearing Restrictions: Yes RLE Weight Bearing: Non weight bearing Vital Signs: Therapy Vitals Temp: 98 F (36.7 C) Temp src: Oral Pulse Rate: 80 Resp: 17 BP: 158/70 mmHg Patient Position (if appropriate): Sitting Oxygen Therapy SpO2: 98 % O2 Device: None (Room air) Pain:  None reported Mobility:  Pt supervision for transfers with cues for ventilation. Min A for no UE partial sit to stands Locomotion : Ambulation Ambulation/Gait Assistance: 5: Supervision for 5' x2 Wheelchair Mobility Distance: 200'x1, 150'x1  Balance: Static Sitting Balance Static Sitting - Balance Support: No upper extremity supported Static Sitting - Level of Assistance: 6: Modified independent (Device/Increase time) Dynamic Sitting Balance Dynamic Sitting - Balance Support: Bilateral upper extremity supported;No upper extremity supported;Feet supported Dynamic Sitting - Level of Assistance: 5: Stand by assistance Reach (Patient is able to reach ___ inches to right, left, forward, back):  anterior weight shift with then without UE support 3x10 each then anterior weight shift with swiss ball 2x10, partial sit to stands with UE support with swiss ball then without UE support 2x5  Dynamic Sitting - Balance Activities: Lateral lean/weight shifting;Trunk control activities Static Standing Balance Static Standing - Balance Support: Bilateral upper extremity supported Static Standing - Level of Assistance: 5: Stand by assistance Static Standing - Comment/# of Minutes: with RW Other Treatments:  Facilitation of diaphragm performed followed by diaphragm breathing 2'x2. Pt educated on limitations of valsalva and reasons for proper ventilation. RLE marching 2x10. Pt performs w/c push-ups 2x10.  See FIM for current functional status  Therapy/Group: Individual Therapy  Monia Pouch 03/24/2014, 5:15 PM

## 2014-03-24 NOTE — Progress Notes (Signed)
Occupational Therapy Session Note  Patient Details  Name: Carolyn Valenzuela MRN: [id number] Date of Birth: November 03, 1950  Today's Date: 03/24/2014 Time:830-930   Time Calculation this session=60 min group therapy   Skilled Therapeutic Interventions/Progress Updates: Though [name] stated she was tired due to her glucose was 39 this morning and came up much higher soon after eating per patient report, she participated in therapeutic activity group to increase her strength and endurance and tub /shower transfer (min A stand pivot) via w/c and tub transfer bench.     Therapy Documentation Precautions:  Precautions Precautions: Fall Required Braces or Orthoses: Knee Immobilizer - Right (custom made) Knee Immobilizer - Right: On at all times Restrictions Weight Bearing Restrictions: Yes RLE Weight Bearing: Non weight bearing  Pain:denied    See FIM for current functional status  Therapy/Group: Group Therapy  Herschell Dimes 03/24/2014, 3:14 PM

## 2014-03-24 NOTE — Progress Notes (Signed)
Hypoglycemic Event  CBG: 48  Treatment: juice+meal  Symptoms: none  Follow-up CBG: Time:1152 CBG Result:92  Possible Reasons for Event: medication  Comments/MD notified:    Jillyn Ledger  Remember to initiate Hypoglycemia Order Set & complete

## 2014-03-24 NOTE — Progress Notes (Signed)
[place] PHYSICAL MEDICINE & REHABILITATION     PROGRESS NOTE    Subjective/Complaints: Pain better controlled. Low CBG, pt had graham crackers last noc Objective: Vital Signs: Blood pressure 136/71, pulse 75, temperature 98.7 F (37.1 C), temperature source Oral, resp. rate 17, height 5' (1.524 m), weight 54.5 kg (120 lb 2.4 oz), SpO2 95.00%. No results found. No results found for this basename: WBC, HGB, HCT, PLT,  in the last 72 hours No results found for this basename: NA, K, CL, CO, GLUCOSE, BUN, CREATININE, CALCIUM,  in the last 72 hours CBG (last 3)   Recent Labs  03/24/14 0625 03/24/14 0635 03/24/14 0658  GLUCAP 39* 45* 65*    Wt Readings from Last 3 Encounters:  03/21/14 54.5 kg (120 lb 2.4 oz)  03/19/14 54.432 kg (120 lb)  03/19/14 54.432 kg (120 lb)    Physical Exam:  Constitutional: She is oriented to person, place, and time.  HENT: oral mucosa pink missing teeth  Head: Normocephalic.  Eyes: EOM are normal.  Neck: Normal range of motion. Neck supple. No thyromegaly present.  Cardiovascular: Normal rate and regular rhythm. No murmur or rub  Respiratory: Effort normal and breath sounds normal. No respiratory distress.  GI: Soft. Bowel sounds are normal. She exhibits no distension. Non-tender Musculoskeletal:  Right BK wound is dressed.  Still a little tender.  Neurological: She is alert and oriented to person, place, and time.  UE 5/5 bilaterally delt, bicep, tricep, HI. LLE: 4- HF, 4/5 KE to 4/5 at ankle. Able to lift RLE agst gravity with ACE in place. No sensory deficits noted.  Skin:  BKA site clean. No drainage.   Calluses noted over the left first toe and, metatarsal area, 5th toe dry/hardened also. No specific breakdown seen.  Psychiatric: She has a normal mood and affect. Her behavior is normal. Judgment and thought content normal    Assessment/Plan: 1. Functional deficits secondary to right BKA which require 3+ hours per day of interdisciplinary  therapy in a comprehensive inpatient rehab setting. Physiatrist is providing close team supervision and 24 hour management of active medical problems listed below. Physiatrist and rehab team continue to assess barriers to discharge/monitor patient progress toward functional and medical goals. FIM: FIM - Bathing Bathing Steps Patient Completed: Chest;Right Arm;Left Arm;Abdomen;Front perineal area;Buttocks;Right upper leg;Left upper leg;Left lower leg (including foot) Bathing: 4: Steadying assist (sit EOB and stand)  FIM - Upper Body Dressing/Undressing Upper body dressing/undressing steps patient completed: Thread/unthread right bra strap;Thread/unthread left bra strap;Hook/unhook bra;Thread/unthread right sleeve of pullover shirt/dresss;Thread/unthread left sleeve of pullover shirt/dress;Put head through opening of pull over shirt/dress;Pull shirt over trunk Upper body dressing/undressing: 5: Set-up assist to: Obtain clothing/put away FIM - Lower Body Dressing/Undressing Lower body dressing/undressing steps patient completed: Thread/unthread right underwear leg;Thread/unthread left underwear leg;Pull underwear up/down;Thread/unthread right pants leg;Thread/unthread left pants leg;Don/Doff left sock;Don/Doff left shoe Lower body dressing/undressing: 4: Min-Patient completed 75 plus % of tasks  FIM - Toileting Toileting steps completed by patient: Adjust clothing prior to toileting;Adjust clothing after toileting;Performs perineal hygiene Toileting Assistive Devices: Grab bar or rail for support;Toilet Aid/prosthesis/orthosis Toileting: 4: Steadying assist  FIM - Radio producer Devices: Recruitment consultant Transfers: 4-To toilet/BSC: Min A (steadying Pt. > 75%);4-From toilet/BSC: Min A (steadying Pt. > 75%)  FIM - Control and instrumentation engineer Devices: Walker;Bed rails Bed/Chair Transfer: 5: Supine > Sit: Supervision (verbal cues/safety  issues);5: Sit > Supine: Supervision (verbal cues/safety issues);5: Bed > Chair or W/C: Supervision (verbal cues/safety  issues);5: Chair or W/C > Bed: Supervision (verbal cues/safety issues)  FIM - Locomotion: Wheelchair Distance: 400 Locomotion: Wheelchair: 5: Travels 150 ft or more: maneuvers on rugs and over door sills with supervision, cueing or coaxing FIM - Locomotion: Ambulation Locomotion: Ambulation Assistive Devices: Web designer Ambulation/Gait Assistance: 5: Supervision Locomotion: Ambulation: 2: Travels 50 - 149 ft with supervision/safety issues  Comprehension Comprehension Mode: Auditory Comprehension: 5-Understands basic 90% of the time/requires cueing < 10% of the time  Expression Expression Mode: Verbal Expression: 5-Expresses basic 90% of the time/requires cueing < 10% of the time.  Social Interaction Social Interaction: 7-Interacts appropriately with others - No medications needed.  Problem Solving Problem Solving: 5-Solves complex 90% of the time/cues < 10% of the time  Memory Memory: 5-Recognizes or recalls 90% of the time/requires cueing < 10% of the time  Medical Problem List and Plan:  1. Functional deficits secondary to right BKA secondary to gangrenous changes 03/16/2014   -use ACE wrap for night---shrinker too tight 2. DVT Prophylaxis/Anticoagulation: SCDs left lower extremity. Monitor for any signs of DVT  3. Pain Management: Baclofen 10 mg every 8 hours as needed muscle spasms, Hydrocodone as needed. Monitor with increased mobility  4. acute on chronic blood loss anemia. Patient has been transfused. Continue iron supplement. Followup CBC  5. Neuropsych: This patient is capable of making decisions on his own behalf.  6. Hypertension. Norvasc 10 mg daily, Coreg 6.25 mg twice a day, Lasix 40 mg twice a day. Monitor with increased mobility   -increased coreg to 12.5mg  bid---observe for effect 7. Diabetes mellitus with peripheral neuropathy. Hemoglobin  A1c 8.2. Lantus insulin 20 units daily. Check blood sugars a.c. and at bedtime.   -restart amaryl at 1mg  bid (on 4mg  bid at home)  -will d/c lantus and monitor 8. Chronic renal insufficiency. Baseline creatinine 6.42. Patient currently not on dialysis followed outpatient by renal services Dr. Moshe Cipro.   -Cr/BUN near baseline 9. Hyperlipidemia. Zocor 10. Leukocytosis---WBC trending down    -urine cx neg  -wound looks good, chest clear  LOS (Days) 4 A FACE TO FACE EVALUATION WAS PERFORMED  KIRSTEINS,ANDREW E 03/24/2014 7:29 AM

## 2014-03-24 NOTE — Progress Notes (Signed)
Occupational Therapy Session Note  Patient Details  Name: Carolyn Valenzuela MRN: [id number] Date of Birth: 03-10-1951  Today's Date: 03/24/2014 Time:1400-1445  Time Calculation (min): 45 min  Short Term Goals: Week 1:  OT Short Term Goal 1 (Week 1): STG=LTGs due to estimated short LOS  Skilled Therapeutic Interventions/Progress Updates:    Focus of treatment was functional mobility, and UE strengthening and AROM.  Pt. Was SBA for supine to sit.  Ambulated around room with RW and minimal assist to SBA for balance.  Practice UE AROM and isotonic exercises with minimal cues.  Left pt in bed and call bell,phone within reach.     Therapy Documentation Precautions:  Precautions Precautions: Fall Required Braces or Orthoses: Knee Immobilizer - Right Knee Immobilizer - Right: On at all times Restrictions Weight Bearing Restrictions: Yes RLE Weight Bearing: Non weight bearing     Pain: Pain Assessment Pain Assessment: Faces Pain Score: 0-No pain Pain Type: Neuropathic pain Pain Location: Leg Pain Orientation: Right Pain Descriptors / Indicators: Dull;Aching Pain Frequency: Intermittent Pain Onset: On-going Pain Intervention(s): Medication (See eMAR) :  See FIM for current functional status  Therapy/Group: Individual Therapy  Lisa Roca 03/24/2014, 7:02 PM

## 2014-03-25 ENCOUNTER — Inpatient Hospital Stay (HOSPITAL_COMMUNITY): Payer: BC Managed Care – PPO | Admitting: *Deleted

## 2014-03-25 LAB — GLUCOSE, CAPILLARY
Glucose-Capillary: 215 mg/dL — ABNORMAL HIGH (ref 70–99)
Glucose-Capillary: 185 mg/dL — ABNORMAL HIGH (ref 70–99)
Glucose-Capillary: 188 mg/dL — ABNORMAL HIGH (ref 70–99)
Glucose-Capillary: 250 mg/dL — ABNORMAL HIGH (ref 70–99)
Glucose-Capillary: 364 mg/dL — ABNORMAL HIGH (ref 70–99)

## 2014-03-25 MED ORDER — SENNA 8.6 MG PO TABS
1.0000 | ORAL_TABLET | Freq: Two times a day (BID) | ORAL | Status: DC
  Administered 2014-03-25 – 2014-03-30 (×5): 8.6 mg via ORAL
  Filled 2014-03-25 (×10): qty 1

## 2014-03-25 NOTE — Plan of Care (Signed)
Problem: RH PAIN MANAGEMENT Goal: RH STG PAIN MANAGED AT OR BELOW PT'S PAIN GOAL <3  Outcome: Progressing No c/o

## 2014-03-25 NOTE — Progress Notes (Signed)
[facility] PHYSICAL MEDICINE & REHABILITATION     PROGRESS NOTE    Subjective/Complaints: Objective: No new issues No significant stump or phantom pain States bowels are "loose" but denies watery stools.had BMs at 2200 and 0150 yest Vital Signs: Blood pressure 136/67, pulse 79, temperature 98.8 F (37.1 C), temperature source Oral, resp. rate 18, height 5' (1.524 m), weight 54.5 kg (120 lb 2.4 oz), SpO2 95.00%. No results found. No results found for this basename: WBC, HGB, HCT, PLT,  in the last 72 hours  Recent Labs  03/24/14 0831  GLUCOSE 89   CBG (last 3)   Recent Labs  03/24/14 2204 03/25/14 0301 03/25/14 0701  GLUCAP 262* 215* 185*    Wt Readings from Last 3 Encounters:  03/21/14 54.5 kg (120 lb 2.4 oz)  03/19/14 54.432 kg (120 lb)  03/19/14 54.432 kg (120 lb)    Physical Exam:  Constitutional: She is oriented to person, place, and time.  HENT: oral mucosa pink missing teeth  Head: Normocephalic.  Eyes: EOM are normal.  Neck: Normal range of motion. Neck supple. No thyromegaly present.  Cardiovascular: Normal rate and regular rhythm. No murmur or rub  Respiratory: Effort normal and breath sounds normal. No respiratory distress.  GI: Soft. Bowel sounds are normal. She exhibits no distension. Non-tender Musculoskeletal:  Right BK wound is dressed.  Still a little tender.  Neurological: She is alert and oriented to person, place, and time.  UE 5/5 bilaterally delt, bicep, tricep, HI. LLE: 4- HF, 4/5 KE to 4/5 at ankle. Able to lift RLE agst gravity with ACE in place. No sensory deficits noted.  Skin:  BKA site clean. No drainage.   Calluses noted over the left first toe and, metatarsal area, 5th toe dry/hardened also. No specific breakdown seen.  Psychiatric: She has a normal mood and affect. Her behavior is normal. Judgment and thought content normal    Assessment/Plan: 1. Functional deficits secondary to right BKA which require 3+ hours per day of  interdisciplinary therapy in a comprehensive inpatient rehab setting. Physiatrist is providing close team supervision and 24 hour management of active medical problems listed below. Physiatrist and rehab team continue to assess barriers to discharge/monitor patient progress toward functional and medical goals. FIM: FIM - Bathing Bathing Steps Patient Completed: Chest;Right Arm;Abdomen;Left Arm;Front perineal area;Buttocks;Right upper leg;Left upper leg;Left lower leg (including foot) Bathing: 5: Supervision: Safety issues/verbal cues  FIM - Upper Body Dressing/Undressing Upper body dressing/undressing steps patient completed: Thread/unthread right bra strap;Thread/unthread left bra strap;Hook/unhook bra;Thread/unthread right sleeve of pullover shirt/dresss;Thread/unthread left sleeve of pullover shirt/dress;Put head through opening of pull over shirt/dress;Pull shirt over trunk Upper body dressing/undressing: 5: Set-up assist to: Obtain clothing/put away FIM - Lower Body Dressing/Undressing Lower body dressing/undressing steps patient completed: Thread/unthread right underwear leg;Thread/unthread left underwear leg;Pull underwear up/down;Thread/unthread right pants leg;Thread/unthread left pants leg;Don/Doff left sock;Don/Doff left shoe Lower body dressing/undressing: 4: Min-Patient completed 75 plus % of tasks  FIM - Toileting Toileting steps completed by patient: Adjust clothing prior to toileting;Adjust clothing after toileting;Performs perineal hygiene Toileting Assistive Devices: Grab bar or rail for support;Toilet Aid/prosthesis/orthosis Toileting: 4: Steadying assist  FIM - Radio producer Devices: Recruitment consultant Transfers: 4-To toilet/BSC: Min A (steadying Pt. > 75%);4-From toilet/BSC: Min A (steadying Pt. > 75%)  FIM - Control and instrumentation engineer Devices: Walker;Bed rails Bed/Chair Transfer: 5: Bed > Chair or W/C: Supervision  (verbal cues/safety issues);5: Chair or W/C > Bed: Supervision (verbal cues/safety issues)  FIM -  Locomotion: Wheelchair Distance: 200'x1, 150'x1 Locomotion: Wheelchair: 5: Travels 150 ft or more: maneuvers on rugs and over door sills with supervision, cueing or coaxing FIM - Locomotion: Ambulation Locomotion: Ambulation Assistive Devices: Environmental consultant - Standard Ambulation/Gait Assistance: 5: Supervision Locomotion: Ambulation: 1: Travels less than 50 ft with supervision/safety issues  Comprehension Comprehension Mode: Auditory Comprehension: 5-Understands basic 90% of the time/requires cueing < 10% of the time  Expression Expression Mode: Verbal Expression: 5-Expresses basic 90% of the time/requires cueing < 10% of the time.  Social Interaction Social Interaction: 7-Interacts appropriately with others - No medications needed.  Problem Solving Problem Solving: 5-Solves complex 90% of the time/cues < 10% of the time  Memory Memory: 5-Recognizes or recalls 90% of the time/requires cueing < 10% of the time  Medical Problem List and Plan:  1. Functional deficits secondary to right BKA secondary to gangrenous changes 03/16/2014   -use ACE wrap for night---shrinker too tight 2. DVT Prophylaxis/Anticoagulation: SCDs left lower extremity. Monitor for any signs of DVT  3. Pain Management: Baclofen 10 mg every 8 hours as needed muscle spasms, Hydrocodone as needed. Monitor with increased mobility  4. acute on chronic blood loss anemia. Patient has been transfused. Continue iron supplement. Followup CBC  5. Neuropsych: This patient is capable of making decisions on his own behalf.  6. Hypertension. Norvasc 10 mg daily, Coreg 6.25 mg twice a day, Lasix 40 mg twice a day. Monitor with increased mobility   -increased coreg to 12.5mg  bid---observe for effect 7. Diabetes mellitus with peripheral neuropathy. Hemoglobin A1c 8.2.[name] Check blood sugars a.c. and at bedtime.   -restart amaryl at 1mg  bid (on  4mg  bid at home)  -no hypogylcemia this am off lantus 8. Chronic renal insufficiency. Baseline creatinine 6.42. Patient currently not on dialysis followed outpatient by renal services Dr. Moshe Cipro.   -Cr/BUN near baseline 9. Hyperlipidemia. Zocor 10. Leukocytosis---WBC trending down    -urine cx neg  -wound looks good, chest clear 11.  Stool freq, but formed, will reduce senna to one tab LOS (Days) 5 A FACE TO FACE EVALUATION WAS PERFORMED  Charlett Blake 03/25/2014 7:53 AM

## 2014-03-25 NOTE — Progress Notes (Signed)
Occupational Therapy Session Note  Patient Details  Name: KAIDYNCE SPYKER MRN: [id number] Date of Birth: 1950/09/07  Today's Date: 03/25/2014 Time:  -  1400-1500  (60 min)    Short Term Goals: Week 1:  OT Short Term Goal 1 (Week 1): STG=LTGs due to estimated short LOS  Skilled Therapeutic Interventions/Progress Updates:    Engaged in cooking activity with RW.  Pt. Was minimal to SBA using RW to ambulate around kitchen environment with focus on endurance, activity tolerance, balance in standing, and energy conservation.  Pt. Prepared muffins with no LOB.  Used high stool for frequent rest times.  She gather supplies, mixed and cut fruit for muffins.  She verbalized when she needed to rest.  No safetly issues noted during activity.   Therapy Documentation Precautions:  Precautions Precautions: Fall Required Braces or Orthoses: Knee Immobilizer - Right Knee Immobilizer - Right: On at all times Restrictions Weight Bearing Restrictions: Yes RLE Weight Bearing: Non weight bearing      Pain    none           See FIM for current functional status  Therapy/Group: group therapy  Lisa Roca 03/25/2014, 6:32 PM

## 2014-03-26 ENCOUNTER — Inpatient Hospital Stay (HOSPITAL_COMMUNITY): Payer: BC Managed Care – PPO

## 2014-03-26 ENCOUNTER — Inpatient Hospital Stay (HOSPITAL_COMMUNITY): Payer: BC Managed Care – PPO | Admitting: Occupational Therapy

## 2014-03-26 DIAGNOSIS — L98499 Non-pressure chronic ulcer of skin of other sites with unspecified severity: Secondary | ICD-10-CM

## 2014-03-26 DIAGNOSIS — N186 End stage renal disease: Secondary | ICD-10-CM

## 2014-03-26 DIAGNOSIS — E1165 Type 2 diabetes mellitus with hyperglycemia: Secondary | ICD-10-CM

## 2014-03-26 DIAGNOSIS — IMO0001 Reserved for inherently not codable concepts without codable children: Secondary | ICD-10-CM

## 2014-03-26 DIAGNOSIS — I10 Essential (primary) hypertension: Secondary | ICD-10-CM

## 2014-03-26 DIAGNOSIS — I739 Peripheral vascular disease, unspecified: Secondary | ICD-10-CM

## 2014-03-26 DIAGNOSIS — S88119A Complete traumatic amputation at level between knee and ankle, unspecified lower leg, initial encounter: Secondary | ICD-10-CM

## 2014-03-26 LAB — GLUCOSE, CAPILLARY
Glucose-Capillary: 150 mg/dL — ABNORMAL HIGH (ref 70–99)
Glucose-Capillary: 65 mg/dL — ABNORMAL LOW (ref 70–99)
Glucose-Capillary: 72 mg/dL (ref 70–99)
Glucose-Capillary: 131 mg/dL — ABNORMAL HIGH (ref 70–99)
Glucose-Capillary: 156 mg/dL — ABNORMAL HIGH (ref 70–99)

## 2014-03-26 MED ORDER — GLIMEPIRIDE 2 MG PO TABS
2.0000 mg | ORAL_TABLET | Freq: Two times a day (BID) | ORAL | Status: DC
  Administered 2014-03-26: 2 mg via ORAL
  Filled 2014-03-26 (×4): qty 1

## 2014-03-26 NOTE — Progress Notes (Signed)
Patients blood glucose 364 due to eating candy at bedside in fear of glucose level dropping at HS. Patient educated on how to sustain the glucose level with 2% milk, gram crackers, and peanut butter. Patient stated she understands. Recheck at 3 am was 150. adm

## 2014-03-26 NOTE — Progress Notes (Signed)
Occupational Therapy Session Note  Patient Details  Name: Carolyn Valenzuela MRN: [id number] Date of Birth: 02-10-1951  Today's Date: 03/26/2014 Time: 0905-0950 Time Calculation (min): 45 min  Short Term Goals: Week 1:  OT Short Term Goal 1 (Week 1): STG=LTGs due to estimated short LOS  Skilled Therapeutic Interventions/Progress Updates:    Pt seen for BADL retraining with a focus on activity tolerance and standing balance. Pt stated that she was not feeling well today, feeling slightly light headed with a headache. Blood pressure slightly elevated. Nursing notified. Nursed checked other vitals. Pt WNL. Instead of transfers with RW, pt used W/c as she was light headed. Pt able to transfer bed><w/c S and then into shower with steady A. Pt completed her self care well using lateral leans in shower and then requiring steady A in standing with RW for LB dressing. She did need some A to pull pants up as they were tight.  Pt transferred back to bed to receive her medications from nursing. Overall, good participation despite pt not feeling well.  Therapy Documentation Precautions:  Precautions Precautions: Fall Required Braces or Orthoses: Knee Immobilizer - Right Knee Immobilizer - Right: On at all times Restrictions Weight Bearing Restrictions: Yes RLE Weight Bearing: Non weight bearing    Vital Signs: Therapy Vitals Pulse Rate: 80 BP: 148/60 mmHg Pain: Pain Assessment Pain Assessment: 0-10 Pain Score: 4  Pain Type: Acute pain Pain Location: Head Pain Descriptors / Indicators: Aching Pain Frequency: Intermittent Pain Onset: Gradual Pain Intervention(s): Medication (See eMAR) ADL:  See FIM for current functional status  Therapy/Group: Individual Therapy  SAGUIER,JULIA 03/26/2014, 10:11 AM

## 2014-03-26 NOTE — Progress Notes (Signed)
Occupational Therapy Note  Patient Details  Name: Carolyn Valenzuela MRN: [id number] Date of Birth: April 07, 1951 Today's Date: 03/26/2014  Time: 1415-1445 Pt denied pain Individual Therapy  Pt engaged in dynamic standing activities to address activity tolerance and dynamic standing balance.  Pt retrieved horse shoes from table and tossed at stake before amb with RW to retrieve from floor using reacher.  Pt required rest breaks X 3 during activities.  Pt required steady assist during tasks.  Pt propelled w/c to and from gym.  Pt remained in w/c with all needs within reach.   Leotis Shames [facility] 03/26/2014, 3:12 PM

## 2014-03-26 NOTE — Progress Notes (Signed)
Physical Therapy Session Note  Patient Details  Name: Carolyn Valenzuela MRN: [id number] Date of Birth: [birth year]  Today's Date: 03/26/2014 Time: 0905-0950 Time Calculation (min): 45 min  Short Term Goals: Week 1:  PT Short Term Goal 1 (Week 1): STG=LTGs due to ELOS  Skilled Therapeutic Interventions/Progress Updates:  AM Session (54 min): Pt. Received supine in bed with HOB elevated. Mod A for donning orthosis for knee extension. Pt. C/o throbbing due to swelling in residual limb prior to session; symptoms subsided with donning and positioning change. Pt. Agreeable to therapy.  1:1 Treatment focused on functional independence with emphasis on balance and activity endurance for ambulation. Pt. Assist level is (S) with occasional min A with fatigue. Dynamic balance activities with forward and crossing midline bil UEs with cues for sequencing and adjusting stance and hand position for maintaining safe stance activities. Pt. Benefited from RLE quad stretching in sidelying for prolonged and progressive therapist assisted stretching. Pt. Displayed increased comfort and greater AROM post manual stretching.  Pain: 3/10 residual limb pain with activity, Pt. States RN administered medication and "it should take effect soon". Pt. Had no c/o pain post therapy session.  Pt. Condition post therapy session: positioned seated in w/c with leg rests with all needs within reach. Family (Nate) present. Lunch wrong; ordering different meal.  PM Session (51 min): Pt. Received seated having completed lunch in w/c with family members present. Pt. Agreeable to therapy and has no c/o pain.  1:1 Treatment focused on functional independence with emphasis on balance and w/c mgmt. Pt. Assist level is (S) with occasional min A with fatigue. Dynamic balance activities with forward and crossing midline bil UEs with cues for sequencing and adjusting stance and hand position for maintaining safe stance activities. Pt. Would  benefit from additional physical therapy for balance and functional endurance/strengthening. Pt. Currently has difficultly with carry-over from day to day and needs frequent verbal and tactile cue for safety and task performance.  Pain: no c/o pain prior or post session.  Pt. Condition post therapy session: positioned seated in w/c with leg rests with all needs within reach. Family (Nate) present.    Therapy Documentation Precautions:  Precautions Precautions: Fall Required Braces or Orthoses: Knee Immobilizer - Right Knee Immobilizer - Right: On at all times Restrictions Weight Bearing Restrictions: Yes RLE Weight Bearing: Non weight bearing  See FIM for current functional status  Therapy/Group: Individual Therapy  Juluis Mire 03/26/2014, 1:21 PM

## 2014-03-26 NOTE — Progress Notes (Signed)
[place] PHYSICAL MEDICINE & REHABILITATION     PROGRESS NOTE    Subjective/Complaints: Objective: No new issues No significant stump or phantom pain States bowels are "loose" but denies watery stools.had BMs at 2200 and 0150 yest Vital Signs: Blood pressure 131/71, pulse 61, temperature 98.2 F (36.8 C), temperature source Oral, resp. rate 17, height 5' (1.524 m), weight 54.5 kg (120 lb 2.4 oz), SpO2 96.00%. No results found. No results found for this basename: WBC, HGB, HCT, PLT,  in the last 72 hours  Recent Labs  03/24/14 0831  GLUCOSE 89   CBG (last 3)   Recent Labs  03/26/14 0301 03/26/14 0725 03/26/14 0747  GLUCAP 150* 65* 72    Wt Readings from Last 3 Encounters:  03/21/14 54.5 kg (120 lb 2.4 oz)  03/19/14 54.432 kg (120 lb)  03/19/14 54.432 kg (120 lb)    Physical Exam:  Constitutional: She is oriented to person, place, and time.  HENT: oral mucosa pink missing teeth  Head: Normocephalic.  Eyes: EOM are normal.  Neck: Normal range of motion. Neck supple. No thyromegaly present.  Cardiovascular: Normal rate and regular rhythm. No murmur or rub  Respiratory: Effort normal and breath sounds normal. No respiratory distress.  GI: Soft. Bowel sounds are normal. She exhibits no distension. Non-tender Musculoskeletal:  Right BK still a little swollen especially distally Neurological: She is alert and oriented to person, place, and time.  UE 5/5 bilaterally delt, bicep, tricep, HI. LLE: 4- HF, 4/5 KE to 4/5 at ankle. Able to lift RLE agst gravity with ACE in place. No sensory deficits noted.  Skin:  BKA site clean.  Calluses noted over the left first toe and, metatarsal area, 5th toe dry/hardened also. Opened blisters on buttocks (2)---superficial, underlying tissue appears clean Psychiatric: She has a normal mood and affect. Her behavior is normal. Judgment and thought content normal    Assessment/Plan: 1. Functional deficits secondary to right BKA which  require 3+ hours per day of interdisciplinary therapy in a comprehensive inpatient rehab setting. Physiatrist is providing close team supervision and 24 hour management of active medical problems listed below. Physiatrist and rehab team continue to assess barriers to discharge/monitor patient progress toward functional and medical goals. FIM: FIM - Bathing Bathing Steps Patient Completed: Chest;Right Arm;Abdomen;Left Arm;Front perineal area;Buttocks;Right upper leg;Left upper leg;Left lower leg (including foot) Bathing: 5: Supervision: Safety issues/verbal cues  FIM - Upper Body Dressing/Undressing Upper body dressing/undressing steps patient completed: Thread/unthread right bra strap;Thread/unthread left bra strap;Hook/unhook bra;Thread/unthread right sleeve of pullover shirt/dresss;Thread/unthread left sleeve of pullover shirt/dress;Put head through opening of pull over shirt/dress;Pull shirt over trunk Upper body dressing/undressing: 5: Set-up assist to: Obtain clothing/put away FIM - Lower Body Dressing/Undressing Lower body dressing/undressing steps patient completed: Thread/unthread right underwear leg;Thread/unthread left underwear leg;Pull underwear up/down;Thread/unthread right pants leg;Thread/unthread left pants leg;Don/Doff left sock;Don/Doff left shoe Lower body dressing/undressing: 4: Min-Patient completed 75 plus % of tasks  FIM - Toileting Toileting steps completed by patient: Adjust clothing prior to toileting;Performs perineal hygiene;Adjust clothing after toileting Toileting Assistive Devices: Grab bar or rail for support Toileting: 4: Steadying assist  FIM - Radio producer Devices: Bedside commode Toilet Transfers: 4-To toilet/BSC: Min A (steadying Pt. > 75%);4-From toilet/BSC: Min A (steadying Pt. > 75%)  FIM - Control and instrumentation engineer Devices: Walker;Bed rails Bed/Chair Transfer: 5: Bed > Chair or W/C: Supervision  (verbal cues/safety issues);5: Chair or W/C > Bed: Supervision (verbal cues/safety issues)  FIM - Locomotion: Wheelchair Distance:  200'x1, 150'x1 Locomotion: Wheelchair: 5: Travels 150 ft or more: maneuvers on rugs and over door sills with supervision, cueing or coaxing FIM - Locomotion: Ambulation Locomotion: Ambulation Assistive Devices: Web designer Ambulation/Gait Assistance: 5: Supervision Locomotion: Ambulation: 1: Travels less than 50 ft with supervision/safety issues  Comprehension Comprehension Mode: Auditory Comprehension: 6-Follows complex conversation/direction: With extra time/assistive device  Expression Expression Mode: Verbal Expression: 7-Expresses complex ideas: With no assist  Social Interaction Social Interaction: 5-Interacts appropriately 90% of the time - Needs monitoring or encouragement for participation or interaction.  Problem Solving Problem Solving: 5-Solves basic 90% of the time/requires cueing < 10% of the time  Memory Memory: 7-Complete Independence: No helper  Medical Problem List and Plan:  1. Functional deficits secondary to right BKA secondary to gangrenous changes 03/16/2014   -use ACE wrap for night---shrinker too tight 2. DVT Prophylaxis/Anticoagulation: SCDs left lower extremity. Monitor for any signs of DVT  3. Pain Management: Baclofen 10 mg every 8 hours as needed muscle spasms, Hydrocodone as needed. Monitor with increased mobility  4. acute on chronic blood loss anemia. Patient has been transfused. Continue iron supplement. Followup CBC  5. Neuropsych: This patient is capable of making decisions on his own behalf.  6. Hypertension. Norvasc 10 mg daily, Coreg 6.25 mg twice a day, Lasix 40 mg twice a day. Monitor with increased mobility   -increased coreg to 12.5mg  bid---observe for effect 7. Diabetes mellitus with peripheral neuropathy. Hemoglobin A1c 8.2.[name] Check blood sugars a.c. and at bedtime.   -restarted amaryl at 1mg  bid (on  4mg  bid at home)---increase to 2mg  bid  -  off lantus 8. Chronic renal insufficiency. Baseline creatinine 6.42. Patient currently not on dialysis followed outpatient by renal services Carolyn Valenzuela.   -Cr/BUN near baseline 9. Hyperlipidemia. Zocor 10. Leukocytosis---WBC trending down    -urine cx neg  -wound looks good, chest clear 11.  Stool freq, but formed, will reduce senna to one tab LOS (Days) 6 A FACE TO FACE EVALUATION WAS PERFORMED  SWARTZ,ZACHARY T 03/26/2014 8:32 AM

## 2014-03-27 ENCOUNTER — Inpatient Hospital Stay (HOSPITAL_COMMUNITY): Payer: BC Managed Care – PPO | Admitting: Physical Therapy

## 2014-03-27 ENCOUNTER — Inpatient Hospital Stay (HOSPITAL_COMMUNITY): Payer: BC Managed Care – PPO

## 2014-03-27 ENCOUNTER — Encounter (HOSPITAL_COMMUNITY): Payer: BC Managed Care – PPO | Admitting: Occupational Therapy

## 2014-03-27 ENCOUNTER — Inpatient Hospital Stay (HOSPITAL_COMMUNITY): Payer: BC Managed Care – PPO | Admitting: Occupational Therapy

## 2014-03-27 DIAGNOSIS — I739 Peripheral vascular disease, unspecified: Secondary | ICD-10-CM | POA: Diagnosis not present

## 2014-03-27 DIAGNOSIS — E1165 Type 2 diabetes mellitus with hyperglycemia: Secondary | ICD-10-CM

## 2014-03-27 DIAGNOSIS — S88119A Complete traumatic amputation at level between knee and ankle, unspecified lower leg, initial encounter: Secondary | ICD-10-CM

## 2014-03-27 DIAGNOSIS — IMO0001 Reserved for inherently not codable concepts without codable children: Secondary | ICD-10-CM

## 2014-03-27 DIAGNOSIS — N186 End stage renal disease: Secondary | ICD-10-CM

## 2014-03-27 DIAGNOSIS — I10 Essential (primary) hypertension: Secondary | ICD-10-CM

## 2014-03-27 DIAGNOSIS — L98499 Non-pressure chronic ulcer of skin of other sites with unspecified severity: Secondary | ICD-10-CM

## 2014-03-27 LAB — GLUCOSE, CAPILLARY
Glucose-Capillary: 198 mg/dL — ABNORMAL HIGH (ref 70–99)
Glucose-Capillary: 246 mg/dL — ABNORMAL HIGH (ref 70–99)
Glucose-Capillary: 108 mg/dL — ABNORMAL HIGH (ref 70–99)
Glucose-Capillary: 67 mg/dL — ABNORMAL LOW (ref 70–99)
Glucose-Capillary: 71 mg/dL (ref 70–99)
Glucose-Capillary: 119 mg/dL — ABNORMAL HIGH (ref 70–99)
Glucose-Capillary: 174 mg/dL — ABNORMAL HIGH (ref 70–99)
Glucose-Capillary: 205 mg/dL — ABNORMAL HIGH (ref 70–99)

## 2014-03-27 MED ORDER — GLIMEPIRIDE 2 MG PO TABS
2.0000 mg | ORAL_TABLET | Freq: Every day | ORAL | Status: DC
  Administered 2014-03-28: 2 mg via ORAL
  Filled 2014-03-27 (×4): qty 1

## 2014-03-27 NOTE — Progress Notes (Signed)
Physical Therapy Session Note  Patient Details  Name: Carolyn Valenzuela MRN: [id number] Date of Birth: 1951-05-05  Today's Date: 03/27/2014 Time: 1510-1555 Time Calculation (min): 45 min  Short Term Goals: Week 1:  PT Short Term Goal 1 (Week 1): STG=LTGs due to ELOS  Skilled Therapeutic Interventions/Progress Updates:   Pt received sitting in w/c, reporting fatigue but agreeable to therapy. Patient reports that apartment manager has had ramp installed for home entry and sister will be available at discharge to provide 24/7 supervision. Plan for sister to be present for family education before d/c. Pt propelled w/c throughout rehab unit up to 150-200 ft using BUEs, mod I in controlled and home environments and intermittent supervision for w/c parts management. Pt performed stand pivot transfers with RW and squat pivot transfers with overall supervision. In ADL apartment, pt performed bed mobility on regular bed with mod I. Gait training using RW x 30 ft in controlled environment and x 20 ft in home environment with supervision. Pt performed car transfer using RW with supervision/setup and practiced side stepping with RW for negotiation of narrow spaces. Patient's LTGs downgraded for distance ambulated in controlled environment and supervision for ambulation in home environment due to impaired endurance and dynamic balance. Pt left supine in bed with all needs within reach.   Therapy Documentation Precautions:  Precautions Precautions: Fall Required Braces or Orthoses: Knee Immobilizer - Right Knee Immobilizer - Right: On at all times Restrictions Weight Bearing Restrictions: Yes RLE Weight Bearing: Non weight bearing Denied pain   See FIM for current functional status  Therapy/Group: Individual Therapy  Laretta Alstrom 03/27/2014, 4:26 PM

## 2014-03-27 NOTE — Progress Notes (Signed)
Hypoglycemic Event  CBG: 67  Treatment: 15 GM carbohydrate snack  Symptoms: None  Follow-up CBG: Time:07:43 CBG Result:71  Possible Reasons for Event: Medication regimen. Inadequate food intake.  Comments/MD notified:Dr Arita Miss, Salena Saner O  Remember to initiate Hypoglycemia Order Set & complete

## 2014-03-27 NOTE — Progress Notes (Signed)
[place] PHYSICAL MEDICINE & REHABILITATION     PROGRESS NOTE    Subjective/Complaints: Objective: Sugar 67 this am. Otherwise doing well. Pain controlled. Happy with therapy here.  Vital Signs: Blood pressure 136/64, pulse 70, temperature 97.6 F (36.4 C), temperature source Oral, resp. rate 16, height 5' (1.524 m), weight 54.5 kg (120 lb 2.4 oz), SpO2 98.00%. No results found. No results found for this basename: WBC, HGB, HCT, PLT,  in the last 72 hours  Recent Labs  03/24/14 0831  GLUCOSE 89   CBG (last 3)   Recent Labs  03/27/14 0134 03/27/14 0711 03/27/14 0743  GLUCAP 108* 67* 71    Wt Readings from Last 3 Encounters:  03/21/14 54.5 kg (120 lb 2.4 oz)  03/19/14 54.432 kg (120 lb)  03/19/14 54.432 kg (120 lb)    Physical Exam:  Constitutional: She is oriented to person, place, and time.  HENT: oral mucosa pink missing teeth  Head: Normocephalic.  Eyes: EOM are normal.  Neck: Normal range of motion. Neck supple. No thyromegaly present.  Cardiovascular: Normal rate and regular rhythm. No murmur or rub  Respiratory: Effort normal and breath sounds normal. No respiratory distress.  GI: Soft. Bowel sounds are normal. She exhibits no distension. Non-tender Musculoskeletal:  Right BK still swollen distally Neurological: She is alert and oriented to person, place, and time.  UE 5/5 bilaterally delt, bicep, tricep, HI. LLE: 4- HF, 4/5 KE to 4/5 at ankle. Able to lift RLE agst gravity with ACE in place. No sensory deficits noted.  Skin:  BKA site clean.  Calluses noted over the left first toe and, metatarsal area, 5th toe dry/hardened also. Opened blisters on buttocks (2)---superficial, underlying tissue appears clean Psychiatric: She has a normal mood and affect. Her behavior is normal. Judgment and thought content normal    Assessment/Plan: 1. Functional deficits secondary to right BKA which require 3+ hours per day of interdisciplinary therapy in a comprehensive  inpatient rehab setting. Physiatrist is providing close team supervision and 24 hour management of active medical problems listed below. Physiatrist and rehab team continue to assess barriers to discharge/monitor patient progress toward functional and medical goals. FIM: FIM - Bathing Bathing Steps Patient Completed: Chest;Right Arm;Abdomen;Left Arm;Front perineal area;Buttocks;Right upper leg;Left upper leg;Left lower leg (including foot) Bathing: 5: Supervision: Safety issues/verbal cues  FIM - Upper Body Dressing/Undressing Upper body dressing/undressing steps patient completed: Thread/unthread right bra strap;Thread/unthread left bra strap;Hook/unhook bra;Thread/unthread right sleeve of pullover shirt/dresss;Thread/unthread left sleeve of pullover shirt/dress;Put head through opening of pull over shirt/dress;Pull shirt over trunk Upper body dressing/undressing: 5: Set-up assist to: Obtain clothing/put away FIM - Lower Body Dressing/Undressing Lower body dressing/undressing steps patient completed: Thread/unthread right underwear leg;Thread/unthread left underwear leg;Pull underwear up/down;Thread/unthread right pants leg;Thread/unthread left pants leg;Don/Doff left sock;Don/Doff left shoe Lower body dressing/undressing: 4: Min-Patient completed 75 plus % of tasks  FIM - Toileting Toileting steps completed by patient: Adjust clothing prior to toileting;Performs perineal hygiene;Adjust clothing after toileting Toileting Assistive Devices: Grab bar or rail for support Toileting: 4: Steadying assist  FIM - Radio producer Devices: Elevated toilet seat;Walker Toilet Transfers: 4-To toilet/BSC: Min A (steadying Pt. > 75%);4-From toilet/BSC: Min A (steadying Pt. > 75%)  FIM - Control and instrumentation engineer Devices: Walker;Bed rails Bed/Chair Transfer: 5: Bed > Chair or W/C: Supervision (verbal cues/safety issues);5: Chair or W/C > Bed: Supervision  (verbal cues/safety issues)  FIM - Locomotion: Wheelchair Distance: 180 Locomotion: Wheelchair: 5: Travels 150 ft or more: maneuvers on  rugs and over door sills with supervision, cueing or coaxing FIM - Locomotion: Ambulation Locomotion: Ambulation Assistive Devices: Environmental consultant - Standard Ambulation/Gait Assistance: 5: Supervision Locomotion: Ambulation: 1: Travels less than 50 ft with supervision/safety issues  Comprehension Comprehension Mode: Auditory Comprehension: 7-Follows complex conversation/direction: With no assist  Expression Expression Mode: Verbal Expression: 7-Expresses complex ideas: With no assist  Social Interaction Social Interaction: 5-Interacts appropriately 90% of the time - Needs monitoring or encouragement for participation or interaction.  Problem Solving Problem Solving: 5-Solves basic 90% of the time/requires cueing < 10% of the time  Memory Memory: 6-More than reasonable amt of time  Medical Problem List and Plan:  1. Functional deficits secondary to right BKA secondary to gangrenous changes 03/16/2014   -use ACE wrap for night---shrinker too tight 2. DVT Prophylaxis/Anticoagulation: SCDs left lower extremity. Monitor for any signs of DVT  3. Pain Management: Baclofen 10 mg every 8 hours as needed muscle spasms, Hydrocodone as needed. Monitor with increased mobility  4. acute on chronic blood loss anemia. Patient has been transfused. Continue iron supplement. Followup CBC  5. Neuropsych: This patient is capable of making decisions on his own behalf.  6. Hypertension. Norvasc 10 mg daily, Coreg 6.25 mg twice a day, Lasix 40 mg twice a day. Monitor with increased mobility   -increased coreg to 12.5mg  bid---observe for effect 7. Diabetes mellitus with peripheral neuropathy. Hemoglobin A1c 8.2.[name] Check blood sugars a.c. and at bedtime.   -restarted amaryl at 1mg  bid (on 4mg  bid at home)---change to simply 2mg  daily to avoid am hypoglycemia  -lantus stopped 8.  Chronic renal insufficiency. Baseline creatinine 6.42. Patient currently not on dialysis followed outpatient by renal services Dr. Moshe Cipro.   -Cr/BUN near baseline 9. Hyperlipidemia. Zocor 10. Leukocytosis-- resolved 11.  Stool freq better with reduced senna LOS (Days) 7 A FACE TO FACE EVALUATION WAS PERFORMED  SWARTZ,ZACHARY T 03/27/2014 7:48 AM

## 2014-03-27 NOTE — Progress Notes (Signed)
Physical Therapy Session Note  Patient Details  Name: Carolyn Valenzuela MRN: [id number] Date of Birth: 02-Feb-1951  Today's Date: 03/27/2014 Time: [id number] Time Calculation (min): 45 min  Short Term Goals: Week 1:  PT Short Term Goal 1 (Week 1): STG=LTGs due to ELOS Week 2:     Skilled Therapeutic Interventions/Progress Updates:  Pt. Received supine in bed having completed breakfast. Pt. Resistant to therapy session without being clean and in clothing. Therapist assisted with gathering needed items.  1:1 Treatment focused on dynamic balance, activity endurance, and self/care (donning orthotic). Pt. Assist level is min guard Assist with neuromuscular reeducation with increasing complexity of movements. Pt. Demonstrates improved compensation strategies with additional instruction and demonstration from therapist. Pt. Crossing midline with occasional LOB and RW use to steady when single LE standing/balance for safety. Pt. Required min A for gathering items, setup, and equipment positioning. Pt. Performed all pericare at (S) with strategies for performing tasks safely. Pt. Required mod-A for donning orthotic with increased pain (5/10) that subsided with rest and repositioning. Pt. Ambulated 2 x 20'  at (S) with RW and verbal cues for threshold transitioning.   Pt. Response: demonstrates increased activity tolerance with improving balance control. Pt. Would continue to benefit from additional physical therapy sessions.  Pain: 3/10 at end of session subsiding with R LE elevation.  Pt. Condition post therapy session: positioned supine with all needs within reach RLE elevated  Therapy Documentation Precautions:  Precautions Precautions: Fall Required Braces or Orthoses: Knee Immobilizer - Right Knee Immobilizer - Right: On at all times Restrictions Weight Bearing Restrictions: Yes RLE Weight Bearing: Non weight bearing Locomotion : Ambulation Ambulation/Gait Assistance: 5: Supervision    See FIM for current functional status  Therapy/Group: Individual Therapy  Juluis Mire 03/27/2014, 12:44 PM

## 2014-03-27 NOTE — Progress Notes (Signed)
Occupational Therapy Session Notes  Patient Details  Name: Carolyn Valenzuela MRN: [id number] Date of Birth: [birth year]  Today's Date: 03/27/2014  Short Term Goals: Week 1:  OT Short Term Goal 1 (Week 1): STG=LTGs due to estimated short LOS  Skilled Therapeutic Interventions/Progress Updates:   Session #1 [id number] - 60 Minutes Individual Therapy Patient with no initial complaints of pain. At end of session, patient with 4/10 complaints of headache, RN made aware Patient received supine in bed, dressed. Patient stated she already cleaned up with PT this am. Therapist discussed role of OT. Scheduling team aware of patient needing OT for ADL prior to PT. Patient engaged in bed mobility and sat EOB for education from therapist on right residual limb care and donning/doffing of limb guard & compression wrapping. Therapist also educated patient on importance of using inspection mirror to check limb. Therapist demonstrated compression wrapping and patient then return demonstrated wrapping with assistance from therapist. Patient worked on this X2, taking more than a reasonable amount of time due to decreased problem solving with new learned task. Patient donned limb guard, then transferred EOB>w/c with supervision. Therapist educated patient on w/c set-up for w/c management. Patient maneuvered self around room for grooming tasks seated in w/c at sink. From here, patient propelled self > therapy gym for therapeutic exercise on ergometer focusing on BUE strengthening and overall activity tolerance/endurance (patient on ergometer for ~5 minutes with one rest break). Therapist propelled patient back to room and left patient seated in w/c with all needs within reach.   *Recommending a tub transfer bench and BSC for home use.   Session #2 [id number] - 30 Minutes Individual Therapy No complaints of pain Patient received seated in w/c. Therapist discussed d/c planning and patient's house set-up. Patient  propelled self > bathroom doorway and patient stood with RW for functional ambulation in BR>BSC over toilet seat. Patient worked on sit<>stands from [facility], clothing management, dynamic standing balance/tolerance/endurance, overall activity tolerance/endurance. Therapist educated patient on importance of energy conservation for LLE during transfers & clothing management for safety. Patient ambulated from BSC>w/c outside of BR, patient with decreased independence with functional ambulation using RW (turning & side stepping). Patient sat in w/c and therapist educated patient on w/c pushups for strengthening and as a pressure relief/boosing technique(patient with skin breakdown> buttock; RN aware). At end of session, left patient seated in w/c with all needs within reach.   Precautions:  Precautions Precautions: Fall Required Braces or Orthoses: Knee Immobilizer - Right Knee Immobilizer - Right: On at all times Restrictions Weight Bearing Restrictions: Yes RLE Weight Bearing: Non weight bearing  See FIM for current functional status  Soleia Badolato 03/27/2014, 7:19 AM

## 2014-03-28 ENCOUNTER — Inpatient Hospital Stay (HOSPITAL_COMMUNITY): Payer: BC Managed Care – PPO | Admitting: Rehabilitation

## 2014-03-28 ENCOUNTER — Encounter (HOSPITAL_COMMUNITY): Payer: BC Managed Care – PPO | Admitting: Occupational Therapy

## 2014-03-28 ENCOUNTER — Inpatient Hospital Stay (HOSPITAL_COMMUNITY): Payer: BC Managed Care – PPO | Admitting: Physical Therapy

## 2014-03-28 DIAGNOSIS — I739 Peripheral vascular disease, unspecified: Secondary | ICD-10-CM | POA: Diagnosis not present

## 2014-03-28 DIAGNOSIS — S88119A Complete traumatic amputation at level between knee and ankle, unspecified lower leg, initial encounter: Secondary | ICD-10-CM | POA: Diagnosis not present

## 2014-03-28 DIAGNOSIS — L98499 Non-pressure chronic ulcer of skin of other sites with unspecified severity: Secondary | ICD-10-CM | POA: Diagnosis not present

## 2014-03-28 DIAGNOSIS — I10 Essential (primary) hypertension: Secondary | ICD-10-CM | POA: Diagnosis not present

## 2014-03-28 DIAGNOSIS — IMO0001 Reserved for inherently not codable concepts without codable children: Secondary | ICD-10-CM | POA: Diagnosis not present

## 2014-03-28 LAB — GLUCOSE, CAPILLARY
Glucose-Capillary: 96 mg/dL (ref 70–99)
Glucose-Capillary: 70 mg/dL (ref 70–99)
Glucose-Capillary: 90 mg/dL (ref 70–99)
Glucose-Capillary: 161 mg/dL — ABNORMAL HIGH (ref 70–99)
Glucose-Capillary: 212 mg/dL — ABNORMAL HIGH (ref 70–99)

## 2014-03-28 NOTE — Progress Notes (Signed)
[place] PHYSICAL MEDICINE & REHABILITATION     PROGRESS NOTE    Subjective/Complaints: Objective: Sugar 67 this am. Otherwise doing well. Pain controlled. Happy with therapy here.  Vital Signs: Blood pressure 151/72, pulse 75, temperature 98.9 F (37.2 C), temperature source Oral, resp. rate 18, height 5' (1.524 m), weight 54.5 kg (120 lb 2.4 oz), SpO2 100.00%. No results found. No results found for this basename: WBC, HGB, HCT, PLT,  in the last 72 hours No results found for this basename: NA, K, CL, CO, GLUCOSE, BUN, CREATININE, CALCIUM,  in the last 72 hours CBG (last 3)   Recent Labs  03/27/14 2112 03/28/14 0202 03/28/14 0723  GLUCAP 205* 96 70    Wt Readings from Last 3 Encounters:  03/21/14 54.5 kg (120 lb 2.4 oz)  03/19/14 54.432 kg (120 lb)  03/19/14 54.432 kg (120 lb)    Physical Exam:  Constitutional: She is oriented to person, place, and time.  HENT: oral mucosa pink missing teeth  Head: Normocephalic.  Eyes: EOM are normal.  Neck: Normal range of motion. Neck supple. No thyromegaly present.  Cardiovascular: Normal rate and regular rhythm. No murmur or rub  Respiratory: Effort normal and breath sounds normal. No respiratory distress.  GI: Soft. Bowel sounds are normal. She exhibits no distension. Non-tender Musculoskeletal:  Right BK still swollen distally Neurological: She is alert and oriented to person, place, and time.  UE 5/5 bilaterally delt, bicep, tricep, HI. LLE: 4- HF, 4/5 KE to 4/5 at ankle. Able to lift RLE agst gravity with ACE in place. No sensory deficits noted.  Skin:  BKA site clean.  Calluses noted over the left first toe and, metatarsal area, 5th toe dry/hardened also. Opened blisters on buttocks (2)---superficial, underlying tissue appears clean Psychiatric: She has a normal mood and affect. Her behavior is normal. Judgment and thought content normal    Assessment/Plan: 1. Functional deficits secondary to right BKA which require 3+  hours per day of interdisciplinary therapy in a comprehensive inpatient rehab setting. Physiatrist is providing close team supervision and 24 hour management of active medical problems listed below. Physiatrist and rehab team continue to assess barriers to discharge/monitor patient progress toward functional and medical goals. FIM: FIM - Bathing Bathing Steps Patient Completed: Chest;Right Arm;Left Arm;Abdomen;Front perineal area;Buttocks;Right upper leg;Left upper leg;Right lower leg (including foot);Left lower leg (including foot) Bathing: 5: Supervision: Safety issues/verbal cues  FIM - Upper Body Dressing/Undressing Upper body dressing/undressing steps patient completed: Thread/unthread right bra strap;Thread/unthread left bra strap;Hook/unhook bra;Thread/unthread right sleeve of pullover shirt/dresss;Thread/unthread left sleeve of pullover shirt/dress;Put head through opening of pull over shirt/dress;Pull shirt over trunk Upper body dressing/undressing: 5: Set-up assist to: Obtain clothing/put away FIM - Lower Body Dressing/Undressing Lower body dressing/undressing steps patient completed: Thread/unthread right underwear leg;Thread/unthread left underwear leg;Pull underwear up/down;Thread/unthread right pants leg;Thread/unthread left pants leg;Don/Doff left sock;Don/Doff left shoe Lower body dressing/undressing: 4: Steadying Assist  FIM - Toileting Toileting steps completed by patient: Adjust clothing prior to toileting;Performs perineal hygiene;Adjust clothing after toileting Toileting Assistive Devices: Grab bar or rail for support Toileting: 4: Steadying assist  FIM - Radio producer Devices: Elevated toilet seat;Walker;Bedside commode Toilet Transfers: 4-To toilet/BSC: Min A (steadying Pt. > 75%);5-From toilet/BSC: Supervision (verbal cues/safety issues)  FIM - Engineer, site Assistive Devices: Walker;HOB elevated Bed/Chair  Transfer: 5: Bed > Chair or W/C: Supervision (verbal cues/safety issues);5: Chair or W/C > Bed: Supervision (verbal cues/safety issues);6: Sit > Supine: No assist;6: Supine > Sit: No assist  FIM - Locomotion: Wheelchair Distance: 150 Locomotion: Wheelchair: 6: Travels 150 ft or more, turns around, maneuvers to table, bed or toilet, negotiates 3% grade: maneuvers on rugs and over door sills independently FIM - Locomotion: Ambulation Locomotion: Ambulation Assistive Devices: Administrator Ambulation/Gait Assistance: 5: Supervision Locomotion: Ambulation: 1: Travels less than 50 ft with supervision/safety issues  Comprehension Comprehension Mode: Auditory Comprehension: 7-Follows complex conversation/direction: With no assist  Expression Expression Mode: Verbal Expression: 7-Expresses complex ideas: With no assist  Social Interaction Social Interaction: 7-Interacts appropriately with others - No medications needed.  Problem Solving Problem Solving: 6-Solves complex problems: With extra time  Memory Memory: 6-More than reasonable amt of time  Medical Problem List and Plan:  1. Functional deficits secondary to right BKA secondary to gangrenous changes 03/16/2014   -use ACE wrap for night---can reapply shrinker before dc 2. DVT Prophylaxis/Anticoagulation: SCDs left lower extremity. Monitor for any signs of DVT  3. Pain Management: Baclofen 10 mg every 8 hours as needed muscle spasms, Hydrocodone as needed. Monitor with increased mobility  4. acute on chronic blood loss anemia. Patient has been transfused. Continue iron supplement. Followup CBC  5. Neuropsych: This patient is capable of making decisions on his own behalf.  6. Hypertension. Norvasc 10 mg daily, Coreg 6.25 mg twice a day, Lasix 40 mg twice a day. Monitor with increased mobility   -increased coreg to 12.5mg  bid---observe for effect 7. Diabetes mellitus with peripheral neuropathy. Hemoglobin A1c 8.2.[name] Check blood sugars  a.c. and at bedtime.   -(amaryl 4mg  bid at home)---changed to 2mg  daily to avoid am hypoglycemia  -dc hs cbg coverage also  -lantus stopped 8. Chronic renal insufficiency. Baseline creatinine 6.42. Patient currently not on dialysis followed outpatient by renal services Dr. Moshe Cipro.   -Cr/BUN near baseline 9. Hyperlipidemia. Zocor 10. Leukocytosis-- resolved 11.  Stool freq better with reduced senna LOS (Days) 8 A FACE TO FACE EVALUATION WAS PERFORMED  SWARTZ,ZACHARY T 03/28/2014 7:58 AM

## 2014-03-28 NOTE — Patient Care Conference (Signed)
Inpatient RehabilitationTeam Conference and Plan of Care Update Date: 03/27/2014   Time: 2:20 PM    Patient Name: Carolyn Valenzuela      Medical Record Number: [id number]  Date of Birth: [birth year] Sex: Female         Room/Bed: 4M06C/4M06C-01 Payor Info: Payor: [place] / Plan: [place] PPO / Product Type: *No Product type* /    Admitting Diagnosis: RT BKA  Admit Date/Time:  03/20/2014  4:41 PM Admission Comments: No comment available   Primary Diagnosis:  <principal problem not specified> Principal Problem: <principal problem not specified>  Patient Active Problem List   Diagnosis Date Noted  . S/P BKA (below knee amputation) 03/20/2014  . 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 Critical lower limb ischemia 01/09/2014  . Right foot ulcer 11/07/2013  . Toe pain 09/06/2013  . Pre-ulcerative corn or callous 09/06/2013  . Lower extremity pain 09/06/2013  . End stage renal disease 04/06/2012  . Left low back pain 12/08/2011  . Hip bursitis, left 12/08/2011  . Left leg pain 12/08/2011  . Abdominal pain, bilateral lower quadrant 07/08/2011  . Preventative health care 12/08/2010  . Allergic rhinitis, cause unspecified 12/08/2010  . DIABETES MELLITUS, UNCONTROLLED 05/21/2009  . SECONDARY HYPERPARATHYROIDISM 05/21/2009  . NUMBNESS 05/21/2009  . BACK PAIN 05/02/2009  . ANEMIA-IRON DEFICIENCY 01/25/2008  . HYPERTENSION 01/25/2008  . CERVICAL RADICULOPATHY, LEFT 01/25/2008  . HYPERLIPIDEMIA 03/30/2007    Expected Discharge Date: Expected Discharge Date: 03/30/14  Team Members Present: Physician leading conference: Dr. Alger Simons Social Worker Present: Lennart Pall, LCSW Nurse Present: Elliot Cousin, RN PT Present: Raylene Everts, Lorriane Shire, PT OT Present: Salome Spotted, OT;Jennifer Tamala Julian, OT;Katie Abner Greenspan, OT;Patricia Lissa Hoard, OT Other (Discipline and Name): Danne Baxter, RN [facility]) PPS Coordinator present : Daiva Nakayama, RN, CRRN;Becky  Alwyn Ren, PT     Current Status/Progress Goal Weekly Team Focus  Medical   right bka. wound care issues/gluteal wounds. diabetes difficult to control  improve activity tolerance  wound care, prosthetic education, skin care, diabetes mgt   Bowel/Bladder   Continent of bowel and bladder. LBM 03/26/14  Continent of bowel and bladder  Pt to remain continent of bowel and bladder   Swallow/Nutrition/ Hydration             ADL's   supervision>min assist  overall mod I, supervision for functional mobility with RW  ADL retraining, sit<>stands, dynamic standing balance/tolerance/endurance, d/c planning, family education prn   Mobility   (S) to min A for LTGs  Mod I overall, (S) car & ambulation  Activity tolerance, Safety, Balance, W/C mgmt   Communication             Safety/Cognition/ Behavioral Observations            Pain   Vicodin x 1-2 tabs q 4hrs prn r/t R BKA discomfort  <3  Assess for frequency of pain request. Attempt to premedicate prior to initial therapy session   Skin   Superficial area to buttock related to open blisters. Area with Allevyn dressing intact. R BKA incision approximated, healling.   No additional skin breakdown  Routine turn q 2hrs, assess open area for appropriate healing    Rehab Goals Patient on target to meet rehab goals: Yes *See Care Plan and progress notes for long and short-term goals.  Barriers to Discharge: fatigue. coping skills, entry into home    Possible Resolutions to Barriers:  ?ramp    Discharge Planning/Teaching Needs:  home with sister to stay with her and provide assistance      Team Discussion:  Some discrepancy with team about goals with plan to follow up with SW after clarified.  Team does feel that pt will need to have someone with her at home and will need a ramp.  SW to follow up with pt and sister.    Revisions to Treatment Plan:  Possible change of functional goals?   Continued Need for Acute Rehabilitation Level of Care: The  patient requires daily medical management by a physician with specialized training in physical medicine and rehabilitation for the following conditions: Daily direction of a multidisciplinary physical rehabilitation program to ensure safe treatment while eliciting the highest outcome that is of practical value to the patient.: Yes Daily medical management of patient stability for increased activity during participation in an intensive rehabilitation regime.: Yes Daily analysis of laboratory values and/or radiology reports with any subsequent need for medication adjustment of medical intervention for : Post surgical problems;Other  Jomari Bartnik 03/28/2014, 9:23 AM

## 2014-03-28 NOTE — Progress Notes (Signed)
Occupational Therapy Session Note  Patient Details  Name: Carolyn Valenzuela MRN: [id number] Date of Birth: 1950-11-06  Today's Date: 03/28/2014 Time: [id number] Time Calculation (min): 46 min  Short Term Goals: Week 1:  OT Short Term Goal 1 (Week 1): STG=LTGs due to estimated short LOS  Skilled Therapeutic Interventions/Progress Updates:  No complaints of pain Patient received supine in bed with limb guard donned. Patient with increased fatigue/sleepiness this morning. Patient engaged in bed mobility, sat EOB, and transferred EOB>w/c with supervision (min verbal cues for safe & effective transfers). Patient maneuvered self > sink for UB/LB bathing & dressing task in sit<>stand position at an overall supervision level. Due to fatigue, patient took more than a reasonable amount of time to complete tasks. Worked with patient on w/c management of donning bilateral leg rests, patient with difficult time. Anticipate patient will require set-up for w/c management, but will be able to complete transfers & ADLs at an overall mod I level. Patient stated she plans to have someone at home with her 24/7. Patient completed grooming tasks seated at sink, recommending this for safety.  Patient's RN present and stated that patient's glucose levels were low this morning and RN requested that patient eat breakfast soon. Therapist set-up breakfast tray and left patient with all needs within reach.   *Plan is for patient to discharge 7/31 > home with family members providing 24/7 supervision.   Precautions:  Precautions Precautions: Fall Required Braces or Orthoses: Knee Immobilizer - Right Knee Immobilizer - Right: On at all times Restrictions Weight Bearing Restrictions: Yes RLE Weight Bearing: Non weight bearing  See FIM for current functional status  Therapy/Group: Individual Therapy  Koni Kannan 03/28/2014, 8:20 AM

## 2014-03-28 NOTE — Progress Notes (Signed)
Social Work Patient ID: [name], female   DOB: [birth year], 63 y.o.   MRN: [id number]  Met yesterday with pt to review team conference.  Pt aware and agreeable with targeted d/c date of 7/31 and reports that her sister, [name], is planning on staying with her at her home.  Also reports that ramp is competed at apt.  Will contact sister today to set up family ed session.  Continue to follow.  Elih Mooney, LCSW

## 2014-03-28 NOTE — Progress Notes (Signed)
Physical Therapy Session Note  Patient Details  Name: Carolyn Valenzuela MRN: [id number] Date of Birth: 11/01/1950  Today's Date: 03/28/2014 Time: [id number] Time Calculation (min): 45 min  Short Term Goals: Week 1:  PT Short Term Goal 1 (Week 1): STG=LTGs due to ELOS  Skilled Therapeutic Interventions/Progress Updates:   Pt received sitting in w/c in room, agreeable to therapy.  Skilled session focused on w/c propulsion for overall strengthening and endurance, negotiating ramp for home entry, negotiating obstacle course with RW to improve quality of gait with RW to simulate tight spaces.  Propelled to/from therapy gym with BUEs at S level.  Min cues for increased arm span during propulsion to decrease fatigue.  Negotiated ramp at min/guard level for safety and mod cues for technique and safety.  Negotiated around cones and over poles with close S to minA for steadying safety with cues for sequencing and technique.  Note that she requires seated rest breaks following cones then after poles due to decreased activity tolerance.  Once finished with obstacle course, pt states she needs to use restroom, therefore transferred back to w/c at S level and was assisted back to room.  Transferred to toilet with grab bars and without RW (pt request) at min A for safety.  Pt able to manage clothing.  Left pt on toilet, RN notified and pt aware to pull call bell when ready.    Therapy Documentation Precautions:  Precautions Precautions: Fall Required Braces or Orthoses: Knee Immobilizer - Right Knee Immobilizer - Right: On at all times Restrictions Weight Bearing Restrictions: Yes RLE Weight Bearing: Non weight bearing   Pain: no pain stated during session.    Locomotion : Ambulation Ambulation/Gait Assistance: 5: Supervision Wheelchair Mobility Distance: 150   See FIM for current functional status  Therapy/Group: Individual Therapy  Denice Bors 03/28/2014, 9:50 AM

## 2014-03-28 NOTE — Progress Notes (Signed)
Physical Therapy Session Note  Patient Details  Name: Carolyn Valenzuela MRN: [id number] Date of Birth: Dec 16, 1950  Today's Date: 03/28/2014 Time: 1132-1207 Time Calculation (min): 35 min  Short Term Goals: Week 1:  PT Short Term Goal 1 (Week 1): STG=LTGs due to ELOS  Skilled Therapeutic Interventions/Progress Updates:   Pt received sitting in recliner in room, notably grimacing in pain.  Pt states her buttocks is hurting her and note she is not sitting in straight alignment.  Had pt stand in order to assess buttocks region.  Note area of broken skin over sacral area (L>R).  RN called to room to further assess and don new dressing.  Discussed pressure relief while in chair and w/c.  Will need further education.  Switched pt cushion to [facility] cushion and had pt transfer to w/c.  Then adjusted ROHO cushion for adequate/appropriate amount of immersion and envelopment based on lowest bony prominence (ischial tuberosity).  Pt with increased comfort in this cushion.  Remainder of session focused on having pt return demonstration of donning/doffing ACE wrap and limb protector.  Pt does well, requires min cues for adding increased pressure in upwards direction to direct swelling upwards.  Pt verbalized understanding.  Also discussed difference in phantom pain vs phantom sensation.  Will continue to address desensitization techniques as well, as she could not recall how to progress.  Pt left in w/c in room to each lunch.  All needs in reach.   Therapy Documentation Precautions:  Precautions Precautions: Fall Required Braces or Orthoses: Knee Immobilizer - Right Knee Immobilizer - Right: On at all times Restrictions Weight Bearing Restrictions: Yes RLE Weight Bearing: Non weight bearing   Pain: Pt with increased pain in buttocks region.    Locomotion : Ambulation Ambulation/Gait Assistance: 5: Supervision Wheelchair Mobility Distance: 150   See FIM for current functional status  Therapy/Group:  Individual Therapy  Denice Bors 03/28/2014, 12:19 PM

## 2014-03-28 NOTE — Progress Notes (Signed)
Physical Therapy Session Note  Patient Details  Name: Carolyn Valenzuela MRN: [id number] Date of Birth: [birth year]  Today's Date: 03/28/2014 Time: 1445-1550 Time Calculation (min): 65 min  Short Term Goals: Week 1:  PT Short Term Goal 1 (Week 1): STG=LTGs due to ELOS  Skilled Therapeutic Interventions/Progress Updates:   Focus on functional transfers, ramp negotiation, endurance, and pressure relief. Pt received sitting in w/c with visitors departing. Pt propelled w/c into bathroom and performed squat pivot transfer w/c <> toilet without grab bar and min A, vc's for hand placement. Pt transferred sit <> stand with RW for LB clothing mgmt, S.  Pt propelled w/c 2 x 150 ft using BUEs with mod I in controlled environment. Pt negotiated up/down 3% grade propelling w/c followed by ambulation with use of RW and close supervision, vc's for RW management on ramp. Patient able to demonstrate appropriate lateral lean pressure relief techniques in w/c but unable to verbalize frequency. Pt education provided as well as demonstration/return demonstration of anterior lean for sacral pressure relief, continue to reinforce. Pt reporting improvement in sacral pain with transition to Roho w/c cushion. Gait training x 30 ft with use of RW and supervision. Pt returned to room, squat pivot transfer w/c > bed and sit > supine with supervision. Pt left supine in bed with all needs within reach.   Therapy Documentation Precautions:  Precautions Precautions: Fall Required Braces or Orthoses: Knee Immobilizer - Right Knee Immobilizer - Right: On at all times Restrictions Weight Bearing Restrictions: Yes RLE Weight Bearing: Non weight bearing Vital Signs: Therapy Vitals Temp: 98.2 F (36.8 C) Temp src: Oral Pulse Rate: 69 Resp: 18 BP: 162/73 mmHg Patient Position (if appropriate): Lying Oxygen Therapy SpO2: 100 % O2 Device: None (Room air) Pain:  "Some" wrist pain, unrated, most likely due to increased  dependence on UEs for functional mobility Improves with rest  See FIM for current functional status  Therapy/Group: Individual Therapy  Laretta Alstrom 03/28/2014, 4:57 PM

## 2014-03-29 ENCOUNTER — Ambulatory Visit (HOSPITAL_COMMUNITY): Payer: BC Managed Care – PPO

## 2014-03-29 ENCOUNTER — Inpatient Hospital Stay (HOSPITAL_COMMUNITY): Payer: BC Managed Care – PPO | Admitting: Occupational Therapy

## 2014-03-29 ENCOUNTER — Encounter (HOSPITAL_COMMUNITY): Payer: BC Managed Care – PPO | Admitting: Occupational Therapy

## 2014-03-29 DIAGNOSIS — S88119A Complete traumatic amputation at level between knee and ankle, unspecified lower leg, initial encounter: Secondary | ICD-10-CM | POA: Diagnosis not present

## 2014-03-29 DIAGNOSIS — I10 Essential (primary) hypertension: Secondary | ICD-10-CM | POA: Diagnosis not present

## 2014-03-29 DIAGNOSIS — IMO0001 Reserved for inherently not codable concepts without codable children: Secondary | ICD-10-CM | POA: Diagnosis not present

## 2014-03-29 DIAGNOSIS — I739 Peripheral vascular disease, unspecified: Secondary | ICD-10-CM | POA: Diagnosis not present

## 2014-03-29 LAB — GLUCOSE, CAPILLARY
Glucose-Capillary: 48 mg/dL — ABNORMAL LOW (ref 70–99)
Glucose-Capillary: 78 mg/dL (ref 70–99)
Glucose-Capillary: 98 mg/dL (ref 70–99)
Glucose-Capillary: 147 mg/dL — ABNORMAL HIGH (ref 70–99)
Glucose-Capillary: 292 mg/dL — ABNORMAL HIGH (ref 70–99)
Glucose-Capillary: 281 mg/dL — ABNORMAL HIGH (ref 70–99)

## 2014-03-29 MED ORDER — GLIMEPIRIDE 2 MG PO TABS
2.0000 mg | ORAL_TABLET | Freq: Two times a day (BID) | ORAL | Status: DC
  Administered 2014-03-29: 2 mg via ORAL
  Filled 2014-03-29 (×4): qty 1

## 2014-03-29 NOTE — Progress Notes (Signed)
Physical Therapy Session Note  Patient Details  Name: Carolyn Valenzuela MRN: [id number] Date of Birth: 1951/03/29  Today's Date: 03/29/2014 Time: 1103-1203 Time Calculation (min): 60 min  Short Term Goals: Week 1:  PT Short Term Goal 1 (Week 1): STG=LTGs due to ELOS Week 2:     Skilled Therapeutic Interventions/Progress Updates:  Pt. Received seated in w/c with family members present; no c/o pain.  1:1 Treatment focused on family education with patient safety focus. Pt. Assist level is mod I with RW with education on energy conservation to patient and family. Family expressed verbal understanding of precautions with fatigue and greater assistance required if/when patient becomes fatigued. Pt. Family instructed and provided demonstrations of proper safety and mobility techniques with patient/family member with occasional verbal cues for improved skill. Pt. Fatigued quickly with ambulation ( <50 feet) and stressed importance of family keeping close for safety and providing assistance. Pt. Gym>room mod I with w/c with propulsion and w/c mgmt. Pain: 3/10 right wrist pain alleviated by rest/repositioning Pt remained seated in w/c with family present with all needs within reach.  Therapy Documentation Precautions:  Precautions Precautions: Fall Required Braces or Orthoses: Knee Immobilizer - Right Knee Immobilizer - Right: On at all times Restrictions Weight Bearing Restrictions: Yes RLE Weight Bearing: Non weight bearing  See FIM for current functional status  Therapy/Group: Individual Therapy  Juluis Mire 03/29/2014, 12:16 PM

## 2014-03-29 NOTE — Progress Notes (Signed)
Occupational Therapy Session Notes  Patient Details  Name: Carolyn Valenzuela MRN: [id number] Date of Birth: 1950/12/05  Today's Date: 03/29/2014  Short Term Goals: Week 1:  OT Short Term Goal 1 (Week 1): STG=LTGs due to estimated short LOS  Skilled Therapeutic Interventions/Progress Updates:   Session #1 0730-0900 - 90 Minutes Individual Therapy No complaints of pain Patient received supine in bed with RN present. RN suggested patient eat soon secondary to low glucose level. Patient engaged in bed mobility at mod I level, transferred EOB>w/c at mod I level, then required set-up for w/c management. Patient then sat in w/c to eat breakfast. This therapist talked with patient during breakfast about discharge planning, home set-up, w/c safety throughout the house, IADLs, importance of supervision during ambulation tasks throughout the house. Patient receptive to all this information and recommendations. After breakfast, patient willing to complete ADL, but refused actual shower. Patient stated, "I'm going to be mostly in the sink because I like to take a bath and not a shower". Patient completed ADL at sink in sit<>stand position at overall mod I level. Patient then completed toilet transfer in BR on/off BSC seated over elevated toilet seat. Therapist went over w/c pushups and patient completed 3 sets of 10X. Throughout session, patient took more than a reasonable amount of time and took multiple rest breaks secondary to general fatigue. At end of session, left patient seated in w/c with all needs within reach.   Session #2 [id number] - 30 Minutes Individual Therapy No complaints of pain Patient received seated in w/c. Patient propelled self from room > ADL apartment. Tub/shower transfer on/off tub transfer bench completed with supervision. Patient then propelled self into ADL kitchen for focus on simple meal prep, patient mod I for this activity from a w/c level. Patient propelled self back to room  and left seated in w/c with all needs within reach.   *Patient to discharge tomorrow>her house with supervision from family members. Please see discharge summary for more information.   Precautions:  Precautions Precautions: Fall Required Braces or Orthoses: Knee Immobilizer - Right Knee Immobilizer - Right: On at all times Restrictions Weight Bearing Restrictions: Yes RLE Weight Bearing: Non weight bearing  See FIM for current functional status  Shelbee Apgar 03/29/2014, 7:19 AM

## 2014-03-29 NOTE — Discharge Summary (Signed)
Discharge summary job [id number]

## 2014-03-29 NOTE — Discharge Summary (Signed)
Carolyn Valenzuela, [name]               ACCOUNT NO.:  [id number]  MEDICAL RECORD NO.:  [id number]  LOCATION:  4M06C                        FACILITY:  [facility]  PHYSICIAN:  Meredith Staggers, M.D.DATE OF BIRTH:  06/17/1951  DATE OF ADMISSION:  03/19/2014 DATE OF DISCHARGE:  03/30/2014                              DISCHARGE SUMMARY   DISCHARGE DIAGNOSES: 1. Functional deficit secondary to right below-knee amputation after     gangrenous changes on March 16, 2014. 2. Sequential compression devices, left lower extremity for deep     venous thrombosis prophylaxis. 3. Pain management. 4. Acute on chronic blood loss anemia. 5. Hypertension. 6. Diabetes mellitus with peripheral neuropathy. 7. Chronic renal insufficiency. 8. Hyperlipidemia. 9. Constipation, resolved.  HISTORY OF PRESENT ILLNESS:  This is a 63 year old right-handed female, history of poorly controlled diabetes mellitus with peripheral neuropathy, chronic anemia, as well as chronic renal insufficiency with baseline creatinine 6.42, currently not on dialysis followed by [facility].  The patient independent with a cane prior to admission, living alone with good supportive family.  Presented on March 15, 2014, with progressive gangrenous ischemic changes, right lower extremity. Wounds were not felt to be salvageable and underwent right below-knee amputation on March 16, 2014, per Dr. Doran Durand.  Postoperative pain management.  Acute blood loss anemia, 5.8, transfused.  Renal function monitored with latest creatinine 5.20.  The patient had been [facility] by Dr. Moshe Cipro.  Physical therapy, occupational therapy, completed, patient was admitted for comprehensive rehab program.  PAST MEDICAL HISTORY:  See discharge diagnoses.  SOCIAL HISTORY:  Lives alone with good support of family.  Functional history prior to admission independent with a cane.  Functional status upon admission to [facility] was  moderate assist for squat pivot transfers; moderate assist, stand pivot transfers; moderate assist, ambulate 2 feet with a rolling walker; min to mod assist, activities of daily living.  PHYSICAL EXAMINATION:  VITAL SIGNS:  Blood pressure 162/80, pulse 86, temperature 98, respirations 18. GENERAL:  This was an alert female, oriented x3.  Pupils round and reactive to light. LUNGS:  Clear to auscultation. CARDIAC:  Regular rate and rhythm. ABDOMEN:  Soft, nontender.  Good bowel sounds. EXTREMITIES:  BKA site dressed, appropriately tender.  She did have some calluses over the dorsal aspect of left foot.  REHABILITATION HOSPITAL COURSE:  Patient was [facility] with therapies initiated on a 3-hour daily basis consisting of physical therapy, occupational therapy, and rehabilitation nursing.  The following issues were addressed during the patient's rehabilitation stay.  Pertaining to [name], right BKA secondary to gangrenous changes on March 16, 2014, surgical site healing nicely.  She would follow up with [facility].  Ace wrap in place.  Plan for stump shrinker.  Sequential compression devices for left lower extremity for DVT prophylaxis.  Pain management with the use of Neurontin 100 mg b.i.d. as well as hydrocodone with baclofen as needed for spasms.  Blood pressure is controlled on current regimen with no orthostatic changes. She would follow up with her primary MD.  She did have a history of diabetes mellitus, peripheral neuropathy, hemoglobin A1c of 8.2.  Her Amaryl had been resumed  at 2 mg daily and titrated to  bid, Lantus insulin discontinued, full diabetic teaching completed.  Chronic renal insufficiency.  She had been followed by [facility] prior to admission.  She would follow up with Dr. Moshe Cipro, [facility], latest creatinine of 5.34.  No current plan for dialysis.  She did have some constipation resolved with laxative  assistance.  The patient received weekly collaborative interdisciplinary team conferences to discuss estimated length of stay, family teaching, and any barriers to her discharge.  Sessions focused on patient and family demonstrating an Ace wrap and limb protector. Discussed issues in phantom pain versus phantom sensations.  Work with strengthening endurance negotiating ramps for home entries, negotiating obstacle course with a rolling walker for her ambulation.  Propel her wheelchair with supervision.  She was able to negotiate ramps with minimal guard level for safety.  Activities of daily living, transferred edge of bed wheelchair with supervision, minimal cues for safety, she can maneuver herself to the sink size for upper lower body bathing, dressing, and tasks, sit to stand position and overall supervision level.  Full family teaching was completed and plan was for ongoing home health physical and occupational therapy.  DISCHARGE MEDICATIONS: 1. Norvasc 10 mg p.o. daily. 2. Aspirin 81 mg p.o. daily. 3. Baclofen 10 mg p.o. every 8 hours. 4. Rocaltrol 0.5 mcg p.o. daily. 5. Coreg 12.5 mg p.o. b.i.d. 6. Colace 100 mg p.o. daily. 7. Ferrous sulfate 325 mg p.o. daily. 8. Lasix 40 mg p.o. b.i.d. 9. Neurontin 100 mg p.o. b.i.d. 10.Amaryl 2 mg p.o.twice daily. 11.Multivitamin 1 tablet daily. 12.Zocor 20 mg p.o. daily.  DIET:  Diabetic diet.  SPECIAL INSTRUCTIONS:  The patient will follow up with [facility], Dr. Naaman Plummer on May 29, 2014; Dr. [name], [facility] 2 weeks call for appointment.  Cathlean Cower, primary care provider on April 12, 2014; Dr. Louie Boston, [facility], call for appointment.  Home health, physical and occupational therapy as well as nurse had been provided by [facility].     Carolyn Valenzuela, P.A.   ______________________________ Meredith Staggers, M.D.    DA/MEDQ  D:  03/29/2014  T:  03/29/2014   Job:  [id number]  cc:   Biagio Borg, MD [name], MD

## 2014-03-29 NOTE — Progress Notes (Signed)
[facility]ILIT[facility]     PROGRESS NOTE    Subjective/Complaints: Objective: Sugar low early this am again. Otherwise feeling well.   Vital Signs: Blood pressure 141/75, pulse 68, temperature 97.8 F (36.6 C), temperature source Oral, resp. rate 18, height 5' (1.524 m), weight 54 kg (119 lb 0.8 oz), SpO2 97.00%. No results found. No results found for this basename: WBC, HGB, HCT, PLT,  in the last 72 hours No results found for this basename: NA, K, CL, CO, GLUCOSE, BUN, CREATININE, CALCIUM,  in the last 72 hours CBG (last 3)   Recent Labs  03/28/14 2101 03/29/14 0536 03/29/14 0653  GLUCAP 212* 48* 78    Wt Readings from Last 3 Encounters:  03/28/14 54 kg (119 lb 0.8 oz)  03/19/14 54.432 kg (120 lb)  03/19/14 54.432 kg (120 lb)    Physical Exam:  Constitutional: She is oriented to person, place, and time.  HENT: oral mucosa pink missing teeth  Head: Normocephalic.  Eyes: EOM are normal.  Neck: Normal range of motion. Neck supple. No thyromegaly present.  Cardiovascular: Normal rate and regular rhythm. No murmur or rub  Respiratory: Effort normal and breath sounds normal. No respiratory distress.  GI: Soft. Bowel sounds are normal. She exhibits no distension. Non-tender Musculoskeletal:  Right BK with decreasing edema. Somewhat tender Neurological: She is alert and oriented to person, place, and time.  UE 5/5 bilaterally delt, bicep, tricep, HI. LLE: 4- HF, 4/5 KE to 4/5 at ankle. Able to lift RLE agst gravity with ACE in place. No sensory deficits noted.  Skin:  BKA site clean.  Calluses noted over the left first toe and, metatarsal area, 5th toe dry/hardened also. Opened blisters on buttocks (2)---superficial, underlying tissue appears clean Psychiatric: She has a normal mood and affect. Her behavior is normal. Judgment and thought content normal    Assessment/Plan: 1. Functional deficits secondary to right BKA which require 3+ hours per day  of interdisciplinary therapy in a comprehensive inpatient rehab setting. Physiatrist is providing close team supervision and 24 hour management of active medical problems listed below. Physiatrist and rehab team continue to assess barriers to discharge/monitor patient progress toward functional and medical goals. FIM: FIM - Bathing Bathing Steps Patient Completed: Chest;Right Arm;Left Arm;Abdomen;Front perineal area;Buttocks;Right upper leg;Left upper leg;Right lower leg (including foot);Left lower leg (including foot) Bathing: 5: Supervision: Safety issues/verbal cues  FIM - Upper Body Dressing/Undressing Upper body dressing/undressing steps patient completed: Thread/unthread right bra strap;Thread/unthread left bra strap;Hook/unhook bra;Thread/unthread right sleeve of pullover shirt/dresss;Thread/unthread left sleeve of pullover shirt/dress;Put head through opening of pull over shirt/dress;Pull shirt over trunk Upper body dressing/undressing: 7: Complete Independence: No helper FIM - Lower Body Dressing/Undressing Lower body dressing/undressing steps patient completed: Thread/unthread right underwear leg;Thread/unthread left underwear leg;Pull underwear up/down;Thread/unthread right pants leg;Thread/unthread left pants leg;Don/Doff left sock Lower body dressing/undressing: 5: Supervision: Safety issues/verbal cues  FIM - Toileting Toileting steps completed by patient: Adjust clothing prior to toileting;Performs perineal hygiene;Adjust clothing after toileting Toileting Assistive Devices: Grab bar or rail for support Toileting: 0: Activity did not occur  FIM - Radio producer Devices: Elevated toilet seat;Grab bars Toilet Transfers: 4-To toilet/BSC: Min A (steadying Pt. > 75%);4-From toilet/BSC: Min A (steadying Pt. > 75%)  FIM - Bed/Chair Transfer Bed/Chair Transfer Assistive Devices: Walker;HOB elevated Bed/Chair Transfer: 0: Activity did not occur  FIM -  Locomotion: Wheelchair Distance: 150 Locomotion: Wheelchair: 4: Travels 150 ft or more: maneuvers on rugs and over door sillls with  minimal assistance (Pt.>75%) (min A for ramp) FIM - Locomotion: Ambulation Locomotion: Ambulation Assistive Devices: Walker - Rolling Ambulation/Gait Assistance: 5: Supervision Locomotion: Ambulation: 1: Travels less than 50 ft with supervision/safety issues  Comprehension Comprehension Mode: Auditory Comprehension: 7-Follows complex conversation/direction: With no assist  Expression Expression Mode: Verbal Expression: 7-Expresses complex ideas: With no assist  Social Interaction Social Interaction: 6-Interacts appropriately with others with medication or extra time (anti-anxiety, antidepressant).  Problem Solving Problem Solving: 6-Solves complex problems: With extra time  Memory Memory: 6-More than reasonable amt of time  Medical Problem List and Plan:  1. Functional deficits secondary to right BKA secondary to gangrenous changes 03/16/2014   -use ACE wrap for night---can reapply shrinker tomorrow 2. DVT Prophylaxis/Anticoagulation: SCDs left lower extremity. Monitor for any signs of DVT  3. Pain Management: Baclofen 10 mg every 8 hours as needed muscle spasms, Hydrocodone as needed. Monitor with increased mobility  4. acute on chronic blood loss anemia. Patient has been transfused. Continue iron supplement. Followup CBC  5. Neuropsych: This patient is capable of making decisions on his own behalf.  6. Hypertension. Norvasc 10 mg daily, Coreg 6.25 mg twice a day, Lasix 40 mg twice a day. Monitor with increased mobility   -increased coreg to 12.5mg  bid---observe for effect 7. Diabetes mellitus with peripheral neuropathy. Hemoglobin A1c 8.2.[name] Check blood sugars a.c. and at bedtime.   -(amaryl 4mg  bid at home)---change to 2mg  bid  -no hs cbg coverage also, try hs snack  -lantus stopped 8. Chronic renal insufficiency. Baseline creatinine 6.42. Patient  currently not on dialysis followed outpatient by renal services Dr. Moshe Cipro.   -Cr/BUN near baseline 9. Hyperlipidemia. Zocor 10. Leukocytosis-- resolved 11.  Stool freq better with reduced senna LOS (Days) 9 A FACE TO FACE EVALUATION WAS PERFORMED  Loura Pitt T 03/29/2014 8:03 AM

## 2014-03-29 NOTE — Progress Notes (Signed)
Inpatient Diabetes Program Recommendations  AACE/ADA: New Consensus Statement on Inpatient Glycemic Control (2013)  Target Ranges:  Prepandial:   less than 140 mg/dL      Peak postprandial:   less than 180 mg/dL (1-2 hours)      Critically ill patients:  140 - 180 mg/dL   Reason for Visit: Hypoglycemia  Results for ARDA, YOH (MRN [id number]) as of 03/29/2014 14:24  Ref. Range 03/28/2014 21:01 03/29/2014 05:36 03/29/2014 06:53 03/29/2014 07:27 03/29/2014 11:09  Glucose-Capillary Latest Range: 70-99 mg/dL 212 (H) 48 (L) 78 98 147 (H)    Inpatient Diabetes Program Recommendations Oral Agents: D/C Amaryl while inpatient to prevent hypoglycemia  Note: Will continue to follow. Thank you. Lorenda Peck, RD, LDN, CDE Inpatient Diabetes Coordinator [phone]

## 2014-03-29 NOTE — Progress Notes (Signed)
Occupational Therapy Discharge Summary  Patient Details  Name: Carolyn Valenzuela MRN: [id number] Date of Birth: June 09, 1951  Today's Date: 03/29/2014  Patient has met 8 of 10 long term goals due to improved activity tolerance, improved balance, postural control, ability to compensate for deficits, improved attention, improved awareness and improved coordination.  Patient to discharge at overall supervision>mod I (supervision using RW, mod I using w/c) level. No family present for OT family education. PT family education scheduled for today. Patient is functioning at an overall supervision>mod I level. Patient requires supervision/set-up assist for w/c management tasks, but is able to complete transfers at an overall mod I level. Patient requires supervision for functional ambulation using RW.   Reasons goals not met: Patient did not meet simple meal prep or light housekeeping goal. Goal was set for patient to complete these tasks from a RW level, patient is mod I from a w/c level. Patient will have assistance with IADL tasks at home.   Recommendation:  Patient will benefit from ongoing skilled OT services in home health setting to continue to advance functional skills in the area of BADL, iADL and Reduce care partner burden.  Equipment: tub transfer bench and drop arm BSC  Reasons for discharge: treatment goals met and discharge from hospital  Patient/family agrees with progress made and goals achieved: Yes  Precautions/Restrictions  Precautions Precautions: Fall Required Braces or Orthoses: Knee Immobilizer - Right Knee Immobilizer - Right: On at all times Restrictions Weight Bearing Restrictions: Yes RLE Weight Bearing: Non weight bearing  Vital Signs Therapy Vitals Temp: 97.8 F (36.6 C) Temp src: Oral Pulse Rate: 68 Resp: 18 BP: 141/75 mmHg Patient Position (if appropriate): Lying Oxygen Therapy SpO2: 97 % O2 Device: None (Room air)  ADL - See FIM  Vision/Perception   Vision- History Baseline Vision/History: Wears glasses Wears Glasses: At all times Patient Visual Report: No change from baseline   Cognition Overall Cognitive Status: Within Functional Limits for tasks assessed Arousal/Alertness: Awake/alert Orientation Level: Oriented X4 Focused Attention: Appears intact Sustained Attention: Appears intact Memory: Appears intact Awareness: Appears intact Problem Solving: Appears intact Safety/Judgment: Appears intact  Sensation Sensation Additional Comments: BUEs appear intact Coordination Gross Motor Movements are Fluid and Coordinated: Yes Fine Motor Movements are Fluid and Coordinated: Yes  Motor  Motor Motor: Within Functional Limits  Mobility  Bed Mobility Bed Mobility: Rolling Right;Rolling Left;Supine to Sit;Sit to Supine Rolling Right: 6: Modified independent (Device/Increase time) Rolling Left: 6: Modified independent (Device/Increase time) Supine to Sit: 6: Modified independent (Device/Increase time) Sit to Supine: 6: Modified independent (Device/Increase time)   Trunk/Postural Assessment  Cervical Assessment Cervical Assessment: Within Functional Limits Thoracic Assessment Thoracic Assessment: Within Functional Limits Lumbar Assessment Lumbar Assessment: Within Functional Limits Postural Control Postural Control: Within Functional Limits   Balance - See PT discharge summary  Extremity/Trunk Assessment RUE Assessment RUE Assessment: Within Functional Limits LUE Assessment LUE Assessment: Within Functional Limits  See FIM for current functional status  Tashena Ibach 03/29/2014, 12:36 PM

## 2014-03-30 DIAGNOSIS — L98499 Non-pressure chronic ulcer of skin of other sites with unspecified severity: Secondary | ICD-10-CM

## 2014-03-30 DIAGNOSIS — E1165 Type 2 diabetes mellitus with hyperglycemia: Secondary | ICD-10-CM

## 2014-03-30 DIAGNOSIS — N186 End stage renal disease: Secondary | ICD-10-CM

## 2014-03-30 DIAGNOSIS — S88119A Complete traumatic amputation at level between knee and ankle, unspecified lower leg, initial encounter: Secondary | ICD-10-CM

## 2014-03-30 DIAGNOSIS — I10 Essential (primary) hypertension: Secondary | ICD-10-CM

## 2014-03-30 DIAGNOSIS — I739 Peripheral vascular disease, unspecified: Secondary | ICD-10-CM | POA: Diagnosis not present

## 2014-03-30 DIAGNOSIS — IMO0001 Reserved for inherently not codable concepts without codable children: Secondary | ICD-10-CM | POA: Diagnosis not present

## 2014-03-30 LAB — GLUCOSE, CAPILLARY
Glucose-Capillary: 114 mg/dL — ABNORMAL HIGH (ref 70–99)
Glucose-Capillary: 133 mg/dL — ABNORMAL HIGH (ref 70–99)
Glucose-Capillary: 169 mg/dL — ABNORMAL HIGH (ref 70–99)
Glucose-Capillary: 216 mg/dL — ABNORMAL HIGH (ref 70–99)
Glucose-Capillary: 41 mg/dL — CL (ref 70–99)
Glucose-Capillary: 48 mg/dL — ABNORMAL LOW (ref 70–99)

## 2014-03-30 MED ORDER — AMLODIPINE BESYLATE 10 MG PO TABS: 10.0000 mg | ORAL_TABLET | Freq: Every day | ORAL | Status: DC

## 2014-03-30 MED ORDER — GLIMEPIRIDE 4 MG PO TABS
4.0000 mg | ORAL_TABLET | Freq: Every day | ORAL | Status: DC
  Administered 2014-03-30: 4 mg via ORAL
  Filled 2014-03-30 (×2): qty 1

## 2014-03-30 MED ORDER — CARVEDILOL 12.5 MG PO TABS: 12.5000 mg | ORAL_TABLET | Freq: Two times a day (BID) | ORAL | Status: DC

## 2014-03-30 MED ORDER — SENNA 8.6 MG PO TABS: 1.0000 | ORAL_TABLET | Freq: Every day | ORAL | Status: DC | PRN

## 2014-03-30 MED ORDER — GLIMEPIRIDE 4 MG PO TABS: 4.0000 mg | ORAL_TABLET | Freq: Every day | ORAL | Status: DC

## 2014-03-30 MED ORDER — HYDROCODONE-ACETAMINOPHEN 5-325 MG PO TABS: 1.0000 | ORAL_TABLET | Freq: Four times a day (QID) | ORAL | Status: DC | PRN

## 2014-03-30 MED ORDER — GABAPENTIN 100 MG PO CAPS: 100.0000 mg | ORAL_CAPSULE | Freq: Two times a day (BID) | ORAL | Status: DC

## 2014-03-30 NOTE — Progress Notes (Signed)
Pt. Got d/c orders and prescriptions,follow up appointments as well.Pt. Ready to go home with her sister.

## 2014-03-30 NOTE — Discharge Instructions (Signed)
Inpatient Rehab Discharge Instructions  Carolyn Valenzuela Discharge date and time: 03/30/14   Activities/Precautions/ Functional Status: Activity: activity as tolerated Diet: diabetic diet Wound Care: Wash with soap and water every other day. Pat dry and apply dry silicone dressing. Contact MD if you develop redness, increased pain, purulent drainage, fever or chills.   Functional status:  ___ No restrictions     ___ Walk up steps independently _X__ 24/7 supervision/assistance   ___ Walk up steps with assistance ___ Intermittent supervision/assistance  ___ Bathe/dress independently ___ Walk with walker     _X__ Bathe/dress with assistance ___ Walk Independently    ___ Shower independently _X__ Walk with supervision    ___ Shower with assistance _X__ No alcohol     ___ Return to work/school ________   COMMUNITY REFERRALS UPON DISCHARGE:    Home Health:   PT     OT      RN                       Agency: [facility] Phone: [phone]   Medical Equipment/Items Ordered: wheelchair, cushion, walker, drop arm commode and tub bench                                                    Agency/Supplier: [facility] @ [phone]   GENERAL COMMUNITY RESOURCES FOR PATIENT/FAMILY:  Support Groups: Amputee Support Group       Special Instructions: 1. Check blood sugars before meals and at bedtime 2. Need to eat a protein snack at bedtime. Need to check blood sugars daily at 6 am as you tend to drop in the mornings.   My questions have been answered and I understand these instructions. I will adhere to these goals and the provided educational materials after my discharge from the hospital.  Patient/Caregiver Signature _______________________________ Date __________  Clinician Signature _______________________________________ Date __________  Please bring this form and your medication list with you to all your follow-up doctor's appointments.

## 2014-03-30 NOTE — Progress Notes (Signed)
[facility] PHYSICAL MEDICINE & REHABILITATION     PROGRESS NOTE    Subjective/Complaints: Objective: Sugar low early this am again. Otherwise feeling well--added back pm amaryl last night.   Vital Signs: Blood pressure 129/69, pulse 66, temperature 98 F (36.7 C), temperature source Oral, resp. rate 16, height 5' (1.524 m), weight 54 kg (119 lb 0.8 oz), SpO2 98.00%. No results found. No results found for this basename: WBC, HGB, HCT, PLT,  in the last 72 hours No results found for this basename: NA, K, CL, CO, GLUCOSE, BUN, CREATININE, CALCIUM,  in the last 72 hours CBG (last 3)   Recent Labs  03/30/14 0010 03/30/14 0133 03/30/14 0651  GLUCAP 169* 133* 41*    Wt Readings from Last 3 Encounters:  03/28/14 54 kg (119 lb 0.8 oz)  03/19/14 54.432 kg (120 lb)  03/19/14 54.432 kg (120 lb)    Physical Exam:  Constitutional: She is oriented to person, place, and time.  HENT: oral mucosa pink missing teeth  Head: Normocephalic.  Eyes: EOM are normal.  Neck: Normal range of motion. Neck supple. No thyromegaly present.  Cardiovascular: Normal rate and regular rhythm. No murmur or rub  Respiratory: Effort normal and breath sounds normal. No respiratory distress.  GI: Soft. Bowel sounds are normal. She exhibits no distension. Non-tender Musculoskeletal:  Right BK with decreasing edema. Somewhat tender Neurological: She is alert and oriented to person, place, and time.  UE 5/5 bilaterally delt, bicep, tricep, HI. LLE: 4- HF, 4/5 KE to 4/5 at ankle. Able to lift RLE agst gravity with ACE in place. No sensory deficits noted.  Skin:  BKA site clean.  Calluses noted over the left first toe and, metatarsal area, 5th toe dry/hardened also. Opened blisters on buttocks (2)---superficial, underlying tissue appears clean Psychiatric: She has a normal mood and affect. Her behavior is normal. Judgment and thought content normal    Assessment/Plan: 1. Functional deficits secondary to right  BKA which require 3+ hours per day of interdisciplinary therapy in a comprehensive inpatient rehab setting. Physiatrist is providing close team supervision and 24 hour management of active medical problems listed below. Physiatrist and rehab team continue to assess barriers to discharge/monitor patient progress toward functional and medical goals.  Was going to place stump shrinker. It appears her sister took it home---continue with ACE for now.  Surgical follow up. i'll see her in the office as well.   FIM: FIM - Bathing Bathing Steps Patient Completed: Chest;Right Arm;Left Arm;Abdomen;Front perineal area;Buttocks;Right upper leg;Left upper leg;Right lower leg (including foot);Left lower leg (including foot) Bathing: 6: Assistive device (Comment)  FIM - Upper Body Dressing/Undressing Upper body dressing/undressing steps patient completed: Thread/unthread right bra strap;Thread/unthread left bra strap;Hook/unhook bra;Thread/unthread right sleeve of pullover shirt/dresss;Thread/unthread left sleeve of pullover shirt/dress;Put head through opening of pull over shirt/dress;Pull shirt over trunk Upper body dressing/undressing: 7: Complete Independence: No helper FIM - Lower Body Dressing/Undressing Lower body dressing/undressing steps patient completed: Thread/unthread right underwear leg;Thread/unthread left underwear leg;Pull underwear up/down;Thread/unthread right pants leg;Thread/unthread left pants leg;Don/Doff left sock Lower body dressing/undressing: 6: Assistive device (Comment)  FIM - Toileting Toileting steps completed by patient: Adjust clothing prior to toileting;Performs perineal hygiene;Adjust clothing after toileting Toileting Assistive Devices: Grab bar or rail for support Toileting: 6: Assistive device: No helper  FIM - Radio producer Devices: Recruitment consultant Transfers: 6-Assistive device: No helper  FIM - Ship broker Devices: Adult nurse Transfer: 6: Assistive device: no helper  FIM -  Locomotion: Wheelchair Distance: 150 Locomotion: Wheelchair: 6: Travels 150 ft or more, turns around, maneuvers to table, bed or toilet, negotiates 3% grade: maneuvers on rugs and over door sills independently FIM - Locomotion: Ambulation Locomotion: Ambulation Assistive Devices: Administrator Ambulation/Gait Assistance: 6: Modified independent (Device/Increase time) Locomotion: Ambulation: 1: Travels less than 50 ft with supervision/safety issues  Comprehension Comprehension Mode: Auditory Comprehension: 6-Follows complex conversation/direction: With extra time/assistive device  Expression Expression Mode: Verbal Expression: 6-Expresses complex ideas: With extra time/assistive device  Social Interaction Social Interaction: 6-Interacts appropriately with others with medication or extra time (anti-anxiety, antidepressant).  Problem Solving Problem Solving: 6-Solves complex problems: With extra time  Memory Memory: 6-More than reasonable amt of time  Medical Problem List and Plan:  1. Functional deficits secondary to right BKA secondary to gangrenous changes 03/16/2014   -use ACE wrap for night---can reapply shrinker tomorrow 2. DVT Prophylaxis/Anticoagulation: SCDs left lower extremity. Monitor for any signs of DVT  3. Pain Management: Baclofen 10 mg every 8 hours as needed muscle spasms, Hydrocodone as needed. Monitor with increased mobility  4. acute on chronic blood loss anemia. Patient has been transfused. Continue iron supplement. Followup CBC  5. Neuropsych: This patient is capable of making decisions on his own behalf.  6. Hypertension. Norvasc 10 mg daily, Coreg 6.25 mg twice a day, Lasix 40 mg twice a day. Monitor with increased mobility   -increased coreg to 12.5mg  bid---observe for effect 7. Diabetes mellitus with peripheral neuropathy. Hemoglobin A1c 8.2.[name] Check blood sugars  a.c. and at bedtime.   -(amaryl 4mg  bid at home)---change to 4mg  daily---  -no hs cbg coverage also, needs hs snack  -lantus stopped 8. Chronic renal insufficiency. Baseline creatinine 6.42. Patient currently not on dialysis followed outpatient by renal services Dr. Moshe Cipro.   -Cr/BUN near baseline 9. Hyperlipidemia. Zocor 10. Leukocytosis-- resolved 11.  Stool freq better with reduced senna LOS (Days) 10 A FACE TO FACE EVALUATION WAS PERFORMED  Nyimah Shadduck T 03/30/2014 7:53 AM

## 2014-03-30 NOTE — Progress Notes (Signed)
Denuded area bilateral buttocks re-assessed by Algis Liming, PA and Rocky Morel. Continue with present care plan; Home Health RN to follow.

## 2014-03-30 NOTE — Progress Notes (Signed)
Social Work  Discharge Note  The overall goal for the admission was met for:   Discharge location: Yes - home with family to stay and provide 24/7 supervision initially  Length of Stay: Yes - 10 days  Discharge activity level: Yes - supervision / modified independent  Home/community participation: Yes  Services provided included: MD, RD, PT, OT, RN, TR, Pharmacy and [place]: Private Insurance: [place]  Follow-up services arranged: Home Health: Therapist, sports, PT, OT via [facility], DME: 16x16 lightweight w/c with right amputee support pad, Roho cushion, youth rolling walker, drop arm commode and tub transfer bench all via [facility] and Patient/Family has no preference for HH/DME agencies  Comments (or additional information): Also provided pt with information on local Amputee Support Group and assisted with start of SSD application  Patient/Family verbalized understanding of follow-up arrangements: Yes  Individual responsible for coordination of the follow-up plan: patient  Confirmed correct DME delivered: Cecillia Menees 03/30/2014    Lowell Mcgurk

## 2014-03-30 NOTE — Progress Notes (Signed)
Physical Therapy Discharge Summary  Patient Details  Name: Carolyn Valenzuela MRN: [id number] Date of Birth: 1950/09/25  Today's Date: 03/30/2014  Patient has met 10 of 10 long term goals due to improved activity tolerance, improved balance, increased strength and decreased pain.  Patient to discharge at a wheelchair level Modified Independent and supervision for ambulation household distances with RW.  Pt's landlord did build a ramp for home entry/exit.   Patient's care partner is independent to provide the necessary supervision assistance at discharge.  Reasons goals not met: All goals met  Recommendation:  Patient will benefit from ongoing skilled PT services in home health setting to continue to advance safe functional mobility, address ongoing impairments in LE weakness, impaired standing tolerance, balance, gait, pre-prosthetic training, and minimize fall risk.  Equipment: youth RW and manual w/c  Reasons for discharge: treatment goals met and discharge from hospital  Patient/family agrees with progress made and goals achieved: Yes  PT Discharge Sensation Sensation Additional Comments: BUEs appear intact Coordination Gross Motor Movements are Fluid and Coordinated: Yes Fine Motor Movements are Fluid and Coordinated: Yes Motor  Motor Motor: Within Functional Limits  Mobility Bed Mobility Bed Mobility: Rolling Right;Rolling Left;Supine to Sit;Sit to Supine Rolling Right: 6: Modified independent (Device/Increase time) Rolling Left: 6: Modified independent (Device/Increase time) Supine to Sit: 6: Modified independent (Device/Increase time) Sit to Supine: 6: Modified independent (Device/Increase time) Locomotion  Ambulation Ambulation: Yes Ambulation/Gait Assistance: 6: Modified independent (Device/Increase time) Assistive device: Rolling walker Gait Gait: Yes Gait Pattern: Impaired Stairs / Additional Locomotion Stairs: Yes Stairs Assistance: 4: Min assist Stairs  Assistance Details: Verbal cues for sequencing Stair Management Technique: With walker Height of Stairs: 6 Ramp: 6: Modified independent (Device) Curb: 4: Min Chemical engineer: Yes Wheelchair Assistance: 6: Modified independent (Device/Increase time) Environmental health practitioner: Both upper extremities Wheelchair Parts Management: Supervision/cueing  Trunk/Postural Assessment  Cervical Assessment Cervical Assessment: Within Scientist, physiological Assessment: Within Functional Limits Lumbar Assessment Lumbar Assessment: Within Functional Limits Postural Control Postural Control: Within Functional Limits  Balance  Pt per PTA report is mod I dynamic sitting and standing balance Extremity Assessment  RLE Assessment RLE Assessment: Exceptions to [facility] LLE Assessment LLE Assessment: Within Functional Limits  See FIM for current functional status  Raylene Everts [facility] 03/30/2014, 9:27 AM

## 2014-04-02 ENCOUNTER — Telehealth: Payer: Self-pay | Admitting: Internal Medicine

## 2014-04-02 NOTE — Telephone Encounter (Signed)
Requesting orders for Home health nursing for disease management education and management education on surgical incision.  Request 3x week for two weeks.  Then twice as week for two weeks and then once as week for three weeks.  Patient requesting assistance from Home health aid as well.  Frequency to be determined by health insurance.

## 2014-04-03 ENCOUNTER — Telehealth: Payer: Self-pay | Admitting: *Deleted

## 2014-04-03 NOTE — Telephone Encounter (Signed)
Ok for verbal 

## 2014-04-03 NOTE — Telephone Encounter (Signed)
Calling for verbal order for OT plan of care 2wk4, 1wk1 .  Approval given.

## 2014-04-04 NOTE — Telephone Encounter (Signed)
HHRN informed of verbal ok. 

## 2014-04-05 ENCOUNTER — Telehealth: Payer: Self-pay | Admitting: *Deleted

## 2014-04-05 NOTE — Telephone Encounter (Signed)
Requesting VO for PT 2wk5.  Also needs to know if and/or when Carolyn Valenzuela needs to wear the knee immobilizer on her amputated leg.  Please advise about the immobilizer.

## 2014-04-05 NOTE — Telephone Encounter (Signed)
Notified Carolyn Valenzuela. The order for PT/OT when leaving hospital is for going out to assess and start of care.  They call with PLAN OF CARE (how many times per week and how many weeks anticipated to meet goals).  They are required to get VO for that part.  ; )

## 2014-04-05 NOTE — Telephone Encounter (Signed)
She may wear for protection when up during the day. Doesn't have to have on while in bed. PT permission granted   [facility] CONTINUOUSLY REQUESTS THERAPY ORDERS FOR PT'S WHOM I HAVE [place]!!!  [facility]

## 2014-04-11 ENCOUNTER — Telehealth: Payer: Self-pay

## 2014-04-11 NOTE — Telephone Encounter (Signed)
Attempted to contact Carolyn Valenzuela @ [facility]. Left a voicemail to return call to clinic.

## 2014-04-11 NOTE — Telephone Encounter (Signed)
Carolyn Valenzuela(OT @ [facility]) is requesting a call back.

## 2014-04-12 ENCOUNTER — Encounter: Payer: Self-pay | Admitting: Internal Medicine

## 2014-04-12 ENCOUNTER — Ambulatory Visit (INDEPENDENT_AMBULATORY_CARE_PROVIDER_SITE_OTHER): Payer: BC Managed Care – PPO | Admitting: Internal Medicine

## 2014-04-12 VITALS — BP 140/68 | HR 70 | Temp 98.3°F | Wt 119.0 lb

## 2014-04-12 DIAGNOSIS — Z89511 Acquired absence of right leg below knee: Secondary | ICD-10-CM

## 2014-04-12 DIAGNOSIS — IMO0001 Reserved for inherently not codable concepts without codable children: Secondary | ICD-10-CM

## 2014-04-12 DIAGNOSIS — E785 Hyperlipidemia, unspecified: Secondary | ICD-10-CM

## 2014-04-12 DIAGNOSIS — E1165 Type 2 diabetes mellitus with hyperglycemia: Principal | ICD-10-CM

## 2014-04-12 DIAGNOSIS — I10 Essential (primary) hypertension: Secondary | ICD-10-CM

## 2014-04-12 DIAGNOSIS — S88119A Complete traumatic amputation at level between knee and ankle, unspecified lower leg, initial encounter: Secondary | ICD-10-CM

## 2014-04-12 MED ORDER — GLIMEPIRIDE 4 MG PO TABS: ORAL_TABLET | ORAL | Status: DC

## 2014-04-12 NOTE — Telephone Encounter (Signed)
Mallory @ [facility] is requesting verbal order to add standing and I ADL's to OT goals. Verbal given

## 2014-04-12 NOTE — Progress Notes (Signed)
Subjective:    Patient ID: [name], female    DOB: August 19, 1951, 63 y.o.   MRN: [id number]  HPI  Here to f/u with family/primary caretaker; overall doing ok,  Pt denies chest pain, increased sob or doe, wheezing, orthopnea, PND, increased LE swelling, palpitations, dizziness or syncope.  Pt denies polydipsia, polyuria, but has had several low sugar symptoms in the evenings improved with po intake on current glimeparide.  Pt denies new neurological symptoms such as new headache, or facial or extremity weakness or numbness.   Pt states overall good compliance with meds, has been trying to follow lower cholesterol, diabetic diet, with wt overall stable,  but plans to try to lose more.   Pt denies fever, wt loss, night sweats, loss of appetite, or other constitutional symptoms Past Medical History  Diagnosis Date  . DIABETES MELLITUS, UNCONTROLLED 05/21/2009  . HYPERLIPIDEMIA 03/30/2007  . ANEMIA-IRON DEFICIENCY 01/25/2008  . HYPERTENSION 01/25/2008  . SINUSITIS- ACUTE-NOS 01/25/2008  . SECONDARY HYPERPARATHYROIDISM 05/21/2009  . RENAL INSUFFICIENCY 02/01/2008  . CERVICAL RADICULOPATHY, LEFT 01/25/2008  . BACK PAIN 05/02/2009  . NUMBNESS 05/21/2009  . Allergic rhinitis, cause unspecified 12/08/2010  . Foot ulcer     right lateral malleolus  . Critical lower limb ischemia    Past Surgical History  Procedure Laterality Date  . Av fistula placement Left   . Eye surgery Bilateral     cataracts removed, left eye still has some oil in it.  . Colonoscopy    . Amputation Right 03/15/2014    Procedure: RIGHT  LEG  BELOW KNEE AMPUTATION ;  Surgeon: [name], MD;  Location: [facility];  Service: Orthopedics;  Laterality: Right;    reports that she has never smoked. She has never used smokeless tobacco. She reports that she does not drink alcohol or use illicit drugs. family history includes Cancer in her mother. Allergies  Allergen Reactions  . Oxycodone Itching   Current Outpatient Prescriptions on  File Prior to Visit  Medication Sig Dispense Refill  . amLODipine (NORVASC) 10 MG tablet Take 1 tablet (10 mg total) by mouth daily. Note increase in dose  30 tablet  1  . aspirin 81 MG EC tablet Take 81 mg by mouth daily.        . calcitRIOL (ROCALTROL) 0.5 MCG capsule Take 0.5 mcg by mouth daily.       . carvedilol (COREG) 12.5 MG tablet Take 1 tablet (12.5 mg total) by mouth 2 (two) times daily with a meal.  60 tablet  1  . ferrous sulfate (FERROUSUL) 325 (65 FE) MG tablet Take 325 mg by mouth daily with breakfast.      . furosemide (LASIX) 40 MG tablet Take 40 mg by mouth 2 (two) times daily.      [name] gabapentin (NEURONTIN) 100 MG capsule Take 1 capsule (100 mg total) by mouth 2 (two) times daily.  60 capsule  1  . HYDROcodone-acetaminophen (NORCO/VICODIN) 5-325 MG per tablet Take 1-2 tablets by mouth every 6 (six) hours as needed for moderate pain.  100 tablet  0  . Multiple Vitamin (MULTIVITAMIN WITH MINERALS) TABS tablet Take 1 tablet by mouth daily.      [name] senna (SENOKOT) 8.6 MG TABS tablet Take 1 tablet (8.6 mg total) by mouth daily as needed for mild constipation.  120 each  0  . simvastatin (ZOCOR) 20 MG tablet Take 1 tablet (20 mg total) by mouth at bedtime.  90 tablet  3  No current facility-administered medications on file prior to visit.    Review of Systems  Constitutional: Negative for unusual diaphoresis or other sweats  HENT: Negative for ringing in ear Eyes: Negative for double vision or worsening visual disturbance.  Respiratory: Negative for choking and stridor.   Gastrointestinal: Negative for vomiting or other signifcant bowel change Genitourinary: Negative for hematuria or decreased urine volume.  Musculoskeletal: Negative for other MSK pain or swelling Skin: Negative for color change and worsening wound.  Neurological: Negative for tremors and numbness other than noted  Psychiatric/Behavioral: Negative for decreased concentration or agitation other than above        Objective:   Physical Exam BP 140/68  Pulse 70  Temp(Src) 98.3 F (36.8 C) (Oral)  Wt 119 lb (53.978 kg)  SpO2 96% VS noted, not ill appearing Constitutional: Pt appears well-developed, well-nourished.  HENT: Head: NCAT.  Right Ear: External ear normal.  Left Ear: External ear normal.  Eyes: . Pupils are equal, round, and reactive to light. Conjunctivae and EOM are normal Neck: Normal range of motion. Neck supple.  Cardiovascular: Normal rate and regular rhythm.   Pulmonary/Chest: Effort normal and breath sounds normal.  Neurological: Pt is alert. Not confused , motor grossly intact Skin: Skin is warm. No rash S/p right BKA, leg elevated seated in the wheelchair Psychiatric: Pt behavior is normal. No agitation.     Assessment & Plan:

## 2014-04-12 NOTE — Progress Notes (Signed)
Pre visit review using our clinic review tool, if applicable. No additional management support is needed unless otherwise documented below in the visit note. 

## 2014-04-12 NOTE — Patient Instructions (Signed)
OK to decrease the glimeparide (sugar medication) to HALF of the 4 mg pill in the AM  Please continue all other medications as before, and refills have been done if requested.  Please have the pharmacy call with any other refills you may need.  Please keep your appointments with your specialists as you may have planned  Please return in 3 wks for a Nurse Visit for the flu shot , and the Prevnar pneumonia shot  Please return in 6 months, or sooner if needed

## 2014-04-15 NOTE — Assessment & Plan Note (Signed)
To f/u ortho as planned 

## 2014-04-15 NOTE — Assessment & Plan Note (Signed)
BP Readings from Last 3 Encounters:  04/12/14 140/68  03/30/14 129/69  03/20/14 171/60

## 2014-04-15 NOTE — Assessment & Plan Note (Signed)
Lab Results  Component Value Date   HGBA1C 8.2* 03/15/2014   Recent elev a1c, but several lower sugars in the PM recently with improved diet, ok for decreased glimeparide to 2 mg in am only, cont to monitor cbg's, call for recurrent further low sugars, cont diet and wt loss efforts

## 2014-04-15 NOTE — Assessment & Plan Note (Signed)
stable overall by history and exam, recent data reviewed with pt, and pt to continue medical treatment as before,  to f/u any worsening symptoms or concerns Lab Results  Component Value Date   LDLCALC 101* 12/08/2010

## 2014-04-16 ENCOUNTER — Encounter: Payer: Self-pay | Admitting: Cardiovascular Disease

## 2014-04-16 ENCOUNTER — Telehealth: Payer: Self-pay | Admitting: Cardiovascular Disease

## 2014-04-19 ENCOUNTER — Ambulatory Visit: Payer: BC Managed Care – PPO | Admitting: Cardiovascular Disease

## 2014-04-20 NOTE — Telephone Encounter (Signed)
Closed encounter °

## 2014-04-25 DIAGNOSIS — E1142 Type 2 diabetes mellitus with diabetic polyneuropathy: Secondary | ICD-10-CM

## 2014-04-25 DIAGNOSIS — Z4789 Encounter for other orthopedic aftercare: Secondary | ICD-10-CM

## 2014-04-25 DIAGNOSIS — I120 Hypertensive chronic kidney disease with stage 5 chronic kidney disease or end stage renal disease: Secondary | ICD-10-CM

## 2014-04-25 DIAGNOSIS — D509 Iron deficiency anemia, unspecified: Secondary | ICD-10-CM

## 2014-04-25 DIAGNOSIS — E1149 Type 2 diabetes mellitus with other diabetic neurological complication: Secondary | ICD-10-CM

## 2014-04-25 DIAGNOSIS — N186 End stage renal disease: Secondary | ICD-10-CM

## 2014-04-27 ENCOUNTER — Telehealth: Payer: Self-pay

## 2014-04-27 NOTE — Telephone Encounter (Signed)
Called HHRN left detailed message of MD verbal ok.

## 2014-04-27 NOTE — Telephone Encounter (Signed)
Ok for verbal 

## 2014-04-27 NOTE — Telephone Encounter (Signed)
Occupational therapist w/ [facility] called and requested that her therapy be extended to one more visit.  Also when she recorded the pt's bp yesterday it was 150/70.  Please advise

## 2014-05-08 ENCOUNTER — Encounter (HOSPITAL_COMMUNITY)
Admission: RE | Admit: 2014-05-08 | Discharge: 2014-05-08 | Disposition: A | Discharge status: Home / Self Care | Payer: BC Managed Care – PPO | Source: Ambulatory Visit | Attending: Nephrology | Admitting: Nephrology

## 2014-05-08 DIAGNOSIS — N184 Chronic kidney disease, stage 4 (severe): Secondary | ICD-10-CM | POA: Insufficient documentation

## 2014-05-08 DIAGNOSIS — D638 Anemia in other chronic diseases classified elsewhere: Secondary | ICD-10-CM | POA: Insufficient documentation

## 2014-05-08 LAB — RENAL FUNCTION PANEL
Albumin: 3 g/dL — ABNORMAL LOW (ref 3.5–5.2)
Anion gap: 14 (ref 5–15)
BUN: 62 mg/dL — ABNORMAL HIGH (ref 6–23)
CO2: 24 mEq/L (ref 19–32)
Calcium: 11.3 mg/dL — ABNORMAL HIGH (ref 8.4–10.5)
Chloride: 94 mEq/L — ABNORMAL LOW (ref 96–112)
Creatinine, Ser: 6.26 mg/dL — ABNORMAL HIGH (ref 0.50–1.10)
GFR calc Af Amer: 8 mL/min — ABNORMAL LOW (ref >90)
GFR calc non Af Amer: 6 mL/min — ABNORMAL LOW (ref >90)
Glucose, Bld: 206 mg/dL — ABNORMAL HIGH (ref 70–99)
Phosphorus: 6 mg/dL — ABNORMAL HIGH (ref 2.3–4.6)
Potassium: 4.4 mEq/L (ref 3.7–5.3)
Sodium: 132 mEq/L — ABNORMAL LOW (ref 137–147)

## 2014-05-08 LAB — POCT HEMOGLOBIN-HEMACUE: Hemoglobin: 8 g/dL — ABNORMAL LOW (ref 12.0–15.0)

## 2014-05-08 LAB — FERRITIN: Ferritin: 1350 ng/mL — ABNORMAL HIGH (ref 10–291)

## 2014-05-08 LAB — IRON AND TIBC
Iron: 62 ug/dL (ref 42–135)
Saturation Ratios: 31 % (ref 20–55)
TIBC: 198 ug/dL — ABNORMAL LOW (ref 250–470)
UIBC: 136 ug/dL (ref 125–400)

## 2014-05-08 MED ORDER — EPOETIN ALFA 20000 UNIT/ML IJ SOLN
INTRAMUSCULAR | Status: AC
  Administered 2014-05-08: 20000 [IU] via SUBCUTANEOUS
  Filled 2014-05-08: qty 1

## 2014-05-08 MED ORDER — EPOETIN ALFA 20000 UNIT/ML IJ SOLN
20000.0000 [IU] | INTRAMUSCULAR | Status: DC
  Administered 2014-05-08: 20000 [IU] via SUBCUTANEOUS

## 2014-05-09 LAB — PTH, INTACT AND CALCIUM
Calcium, Total (PTH): 11.5 mg/dL — ABNORMAL HIGH (ref 8.4–10.5)
PTH: 14 pg/mL (ref 14–64)

## 2014-05-15 ENCOUNTER — Encounter (HOSPITAL_COMMUNITY)
Admission: RE | Admit: 2014-05-15 | Discharge: 2014-05-15 | Disposition: A | Discharge status: Home / Self Care | Payer: BC Managed Care – PPO | Source: Ambulatory Visit | Attending: Nephrology | Admitting: Nephrology

## 2014-05-15 DIAGNOSIS — D638 Anemia in other chronic diseases classified elsewhere: Secondary | ICD-10-CM | POA: Diagnosis not present

## 2014-05-15 LAB — POCT HEMOGLOBIN-HEMACUE: Hemoglobin: 9.1 g/dL — ABNORMAL LOW (ref 12.0–15.0)

## 2014-05-15 MED ORDER — EPOETIN ALFA 20000 UNIT/ML IJ SOLN
20000.0000 [IU] | INTRAMUSCULAR | Status: DC
  Administered 2014-05-15: 20000 [IU] via SUBCUTANEOUS

## 2014-05-15 MED ORDER — EPOETIN ALFA 20000 UNIT/ML IJ SOLN
INTRAMUSCULAR | Status: AC
  Administered 2014-05-15: 20000 [IU] via SUBCUTANEOUS
  Filled 2014-05-15: qty 1

## 2014-05-22 ENCOUNTER — Encounter (HOSPITAL_COMMUNITY)
Admission: RE | Admit: 2014-05-22 | Discharge: 2014-05-22 | Disposition: A | Discharge status: Home / Self Care | Payer: BC Managed Care – PPO | Source: Ambulatory Visit | Attending: Nephrology | Admitting: Nephrology

## 2014-05-22 DIAGNOSIS — D638 Anemia in other chronic diseases classified elsewhere: Secondary | ICD-10-CM | POA: Diagnosis not present

## 2014-05-22 LAB — POCT HEMOGLOBIN-HEMACUE: Hemoglobin: 9.8 g/dL — ABNORMAL LOW (ref 12.0–15.0)

## 2014-05-22 MED ORDER — EPOETIN ALFA 20000 UNIT/ML IJ SOLN
20000.0000 [IU] | INTRAMUSCULAR | Status: DC
  Administered 2014-05-22: 20000 [IU] via SUBCUTANEOUS

## 2014-05-22 MED ORDER — EPOETIN ALFA 20000 UNIT/ML IJ SOLN
INTRAMUSCULAR | Status: AC
  Administered 2014-05-22: 20000 [IU] via SUBCUTANEOUS
  Filled 2014-05-22: qty 1

## 2014-05-24 ENCOUNTER — Other Ambulatory Visit (HOSPITAL_COMMUNITY): Payer: Self-pay | Admitting: Physical Medicine and Rehabilitation

## 2014-05-29 ENCOUNTER — Encounter: Payer: Self-pay | Admitting: Physical Medicine & Rehabilitation

## 2014-05-29 ENCOUNTER — Encounter (HOSPITAL_COMMUNITY)
Admission: RE | Admit: 2014-05-29 | Discharge: 2014-05-29 | Disposition: A | Discharge status: Home / Self Care | Payer: BC Managed Care – PPO | Source: Ambulatory Visit | Attending: Nephrology | Admitting: Nephrology

## 2014-05-29 ENCOUNTER — Other Ambulatory Visit (HOSPITAL_COMMUNITY): Payer: Self-pay | Admitting: Physical Medicine and Rehabilitation

## 2014-05-29 ENCOUNTER — Encounter
Payer: BC Managed Care – PPO | Attending: Physical Medicine & Rehabilitation | Admitting: Physical Medicine & Rehabilitation

## 2014-05-29 VITALS — BP 180/58 | HR 70 | Resp 14 | Ht 63.0 in | Wt 126.0 lb

## 2014-05-29 DIAGNOSIS — I129 Hypertensive chronic kidney disease with stage 1 through stage 4 chronic kidney disease, or unspecified chronic kidney disease: Secondary | ICD-10-CM | POA: Insufficient documentation

## 2014-05-29 DIAGNOSIS — N189 Chronic kidney disease, unspecified: Secondary | ICD-10-CM | POA: Insufficient documentation

## 2014-05-29 DIAGNOSIS — N186 End stage renal disease: Secondary | ICD-10-CM | POA: Diagnosis not present

## 2014-05-29 DIAGNOSIS — E1142 Type 2 diabetes mellitus with diabetic polyneuropathy: Secondary | ICD-10-CM | POA: Diagnosis not present

## 2014-05-29 DIAGNOSIS — E1149 Type 2 diabetes mellitus with other diabetic neurological complication: Secondary | ICD-10-CM | POA: Insufficient documentation

## 2014-05-29 DIAGNOSIS — S88119A Complete traumatic amputation at level between knee and ankle, unspecified lower leg, initial encounter: Secondary | ICD-10-CM | POA: Diagnosis not present

## 2014-05-29 DIAGNOSIS — D638 Anemia in other chronic diseases classified elsewhere: Secondary | ICD-10-CM | POA: Diagnosis not present

## 2014-05-29 DIAGNOSIS — R259 Unspecified abnormal involuntary movements: Secondary | ICD-10-CM | POA: Insufficient documentation

## 2014-05-29 DIAGNOSIS — Z89511 Acquired absence of right leg below knee: Secondary | ICD-10-CM

## 2014-05-29 LAB — POCT HEMOGLOBIN-HEMACUE: Hemoglobin: 11.1 g/dL — ABNORMAL LOW (ref 12.0–15.0)

## 2014-05-29 MED ORDER — EPOETIN ALFA 20000 UNIT/ML IJ SOLN
INTRAMUSCULAR | Status: AC
  Filled 2014-05-29: qty 1

## 2014-05-29 MED ORDER — EPOETIN ALFA 20000 UNIT/ML IJ SOLN
20000.0000 [IU] | INTRAMUSCULAR | Status: DC
  Administered 2014-05-29: 20000 [IU] via SUBCUTANEOUS

## 2014-05-29 NOTE — Patient Instructions (Signed)
PLEASE CALL ME WITH ANY PROBLEMS OR QUESTIONS (#[phone]).      

## 2014-05-29 NOTE — Progress Notes (Signed)
Subjective:    Patient ID: Carolyn Valenzuela, female    DOB: [birth year], 63 y.o.   MRN: [id number]  HPI  Carolyn Valenzuela is back regarding her right BKA. She is working with [facility] therapies. She is doing some short dx walking with a walker. She is independent with her self-care and w/c mobility activities. Her pain is minimal. She has some occasional spasms in the right leg near the calf/hamstrings.   She wears her shrinker faithfully.   Dr. Doran Durand saw her yesterday and wrote her an rx for a cream for the scab on her right residual limb  Her diabetes is under better control. She continues to see nephrology for her chronic kidney disease. Pt reports improvement in all areas.     Pain Inventory Average Pain 1 Pain Right Now 0 My pain is intermittent and .  In the last 24 hours, has pain interfered with the following? General activity 1 Relation with others 1 Enjoyment of life 1 What TIME of day is your pain at its worst? night Sleep (in general) Fair  Pain is worse with: inactivity Pain improves with: medication Relief from Meds: 6  Mobility use a wheelchair  Function disabled: date disabled 11/2013  Neuro/Psych tremor tingling depression  Prior Studies Any changes since last visit?  no  Physicians involved in your care Any changes since last visit?  no   Family History  Problem Relation Age of Onset  . Cancer Mother     Pancreatic   History   Social History  . Marital Status: Single    Spouse Name: N/A    Number of Children: N/A  . Years of Education: N/A   Occupational History  . [place] History Main Topics  . Smoking status: Never Smoker   . Smokeless tobacco: Never Used  . Alcohol Use: No  . Drug Use: No  . Sexual Activity: None   Other Topics Concern  . None   Social History Narrative  . None   Past Surgical History  Procedure Laterality Date  . Av fistula placement Left   . Eye surgery Bilateral    cataracts removed, left eye still has some oil in it.  . Colonoscopy    . Amputation Right 03/15/2014    Procedure: RIGHT  LEG  BELOW KNEE AMPUTATION ;  Surgeon: [name], MD;  Location: [facility];  Service: Orthopedics;  Laterality: Right;   Past Medical History  Diagnosis Date  . DIABETES MELLITUS, UNCONTROLLED 05/21/2009  . HYPERLIPIDEMIA 03/30/2007  . ANEMIA-IRON DEFICIENCY 01/25/2008  . HYPERTENSION 01/25/2008  . SINUSITIS- ACUTE-NOS 01/25/2008  . SECONDARY HYPERPARATHYROIDISM 05/21/2009  . RENAL INSUFFICIENCY 02/01/2008  . CERVICAL RADICULOPATHY, LEFT 01/25/2008  . BACK PAIN 05/02/2009  . NUMBNESS 05/21/2009  . Allergic rhinitis, cause unspecified 12/08/2010  . Foot ulcer     right lateral malleolus  . Critical lower limb ischemia    BP 180/58  Pulse 70  Resp 14  Ht 5\' 3"  (1.6 m)  Wt 126 lb (57.153 kg)  BMI 22.33 kg/m2  SpO2 98%  Opioid Risk Score:   Fall Risk Score: Low Fall Risk (0-5 points)   Review of Systems     Objective:   Physical Exam  Constitutional: She is oriented to person, place, and time.  HENT: oral mucosa pink missing teeth  Head: Normocephalic.  Eyes: EOM are normal.  Neck: Normal range of motion. Neck supple. No thyromegaly present.  Cardiovascular: Normal rate and regular  rhythm. No murmur or rub  Respiratory: Effort normal and breath sounds normal. No respiratory distress.  GI: Soft. Bowel sounds are normal. She exhibits no distension. Non-tender Musculoskeletal:  Right BK appropriately shaped. Minimally tender. Small scab on lateral aspect.  Neurological: She is alert and oriented to person, place, and time.  Strength 5/5 in all 4 limbs.  Skin:   Psychiatric: She has a normal mood and affect. Her behavior is normal. Judgment and thought content normal    Assessment/Plan:  1. Functional deficits secondary to right BKA secondary to gangrenous changes 03/16/2014  -continue shrinker -will follow up with [facility] -ready for fitting  process  -a prosthetic prescription was written today. She is a K3 ambulator who wants to get out into the community, drive, etc. She is VERY motivated. 3. Diabetes mellitus with peripheral neuropathy.  4.. Chronic renal insufficiency.    Follow up with me in about 3 months. Sooner if any problems arise. Thirty minutes of face to face patient care time were spent during this visit. All questions were encouraged and answered.

## 2014-06-05 ENCOUNTER — Inpatient Hospital Stay (HOSPITAL_COMMUNITY): Admission: RE | Admit: 2014-06-05 | Payer: BC Managed Care – PPO | Source: Ambulatory Visit

## 2014-06-06 ENCOUNTER — Ambulatory Visit: Payer: BC Managed Care – PPO | Admitting: Cardiovascular Disease

## 2014-06-12 ENCOUNTER — Encounter (HOSPITAL_COMMUNITY)
Admission: RE | Admit: 2014-06-12 | Discharge: 2014-06-12 | Disposition: A | Discharge status: Home / Self Care | Payer: BC Managed Care – PPO | Source: Ambulatory Visit | Attending: Nephrology | Admitting: Nephrology

## 2014-06-12 DIAGNOSIS — N184 Chronic kidney disease, stage 4 (severe): Secondary | ICD-10-CM | POA: Diagnosis not present

## 2014-06-12 DIAGNOSIS — D631 Anemia in chronic kidney disease: Secondary | ICD-10-CM | POA: Insufficient documentation

## 2014-06-12 LAB — RENAL FUNCTION PANEL
Albumin: 3.1 g/dL — ABNORMAL LOW (ref 3.5–5.2)
Anion gap: 18 — ABNORMAL HIGH (ref 5–15)
BUN: 69 mg/dL — ABNORMAL HIGH (ref 6–23)
CO2: 22 mEq/L (ref 19–32)
Calcium: 9.7 mg/dL (ref 8.4–10.5)
Chloride: 98 mEq/L (ref 96–112)
Creatinine, Ser: 6.93 mg/dL — ABNORMAL HIGH (ref 0.50–1.10)
GFR calc Af Amer: 7 mL/min — ABNORMAL LOW (ref >90)
GFR calc non Af Amer: 6 mL/min — ABNORMAL LOW (ref >90)
Glucose, Bld: 189 mg/dL — ABNORMAL HIGH (ref 70–99)
Phosphorus: 6.6 mg/dL — ABNORMAL HIGH (ref 2.3–4.6)
Potassium: 3.9 mEq/L (ref 3.7–5.3)
Sodium: 138 mEq/L (ref 137–147)

## 2014-06-12 LAB — FERRITIN: Ferritin: 1160 ng/mL — ABNORMAL HIGH (ref 10–291)

## 2014-06-12 LAB — IRON AND TIBC
Iron: 97 ug/dL (ref 42–135)
Saturation Ratios: 59 % — ABNORMAL HIGH (ref 20–55)
TIBC: 164 ug/dL — ABNORMAL LOW (ref 250–470)
UIBC: 67 ug/dL — ABNORMAL LOW (ref 125–400)

## 2014-06-12 LAB — POCT HEMOGLOBIN-HEMACUE: Hemoglobin: 12.7 g/dL (ref 12.0–15.0)

## 2014-06-12 MED ORDER — EPOETIN ALFA 20000 UNIT/ML IJ SOLN: 20000.0000 [IU] | INTRAMUSCULAR | Status: DC

## 2014-06-13 LAB — PTH, INTACT AND CALCIUM
Calcium, Total (PTH): 9.2 mg/dL (ref 8.4–10.5)
PTH: 49 pg/mL (ref 14–64)

## 2014-06-25 ENCOUNTER — Other Ambulatory Visit (HOSPITAL_COMMUNITY): Payer: Self-pay | Admitting: Physical Medicine & Rehabilitation

## 2014-06-26 ENCOUNTER — Encounter (HOSPITAL_COMMUNITY): Payer: BC Managed Care – PPO

## 2014-06-28 ENCOUNTER — Ambulatory Visit: Payer: BC Managed Care – PPO | Attending: Physician Assistant | Admitting: Physical Therapy

## 2014-06-28 DIAGNOSIS — E213 Hyperparathyroidism, unspecified: Secondary | ICD-10-CM | POA: Insufficient documentation

## 2014-06-28 DIAGNOSIS — I998 Other disorder of circulatory system: Secondary | ICD-10-CM | POA: Insufficient documentation

## 2014-06-28 DIAGNOSIS — Z89511 Acquired absence of right leg below knee: Secondary | ICD-10-CM | POA: Insufficient documentation

## 2014-06-28 DIAGNOSIS — Z5189 Encounter for other specified aftercare: Secondary | ICD-10-CM | POA: Insufficient documentation

## 2014-06-28 DIAGNOSIS — N186 End stage renal disease: Secondary | ICD-10-CM | POA: Insufficient documentation

## 2014-06-28 DIAGNOSIS — I120 Hypertensive chronic kidney disease with stage 5 chronic kidney disease or end stage renal disease: Secondary | ICD-10-CM | POA: Insufficient documentation

## 2014-06-28 DIAGNOSIS — R269 Unspecified abnormalities of gait and mobility: Secondary | ICD-10-CM | POA: Diagnosis not present

## 2014-06-28 DIAGNOSIS — R5381 Other malaise: Secondary | ICD-10-CM | POA: Diagnosis not present

## 2014-06-28 DIAGNOSIS — E1122 Type 2 diabetes mellitus with diabetic chronic kidney disease: Secondary | ICD-10-CM | POA: Insufficient documentation

## 2014-07-06 ENCOUNTER — Telehealth: Payer: Self-pay

## 2014-07-06 ENCOUNTER — Ambulatory Visit (INDEPENDENT_AMBULATORY_CARE_PROVIDER_SITE_OTHER): Payer: BC Managed Care – PPO | Admitting: Internal Medicine

## 2014-07-06 ENCOUNTER — Other Ambulatory Visit (INDEPENDENT_AMBULATORY_CARE_PROVIDER_SITE_OTHER): Payer: BC Managed Care – PPO

## 2014-07-06 ENCOUNTER — Encounter: Payer: Self-pay | Admitting: Internal Medicine

## 2014-07-06 VITALS — BP 170/90 | HR 66 | Temp 97.7°F | Ht 63.0 in | Wt 115.8 lb

## 2014-07-06 DIAGNOSIS — I10 Essential (primary) hypertension: Secondary | ICD-10-CM

## 2014-07-06 DIAGNOSIS — E118 Type 2 diabetes mellitus with unspecified complications: Secondary | ICD-10-CM

## 2014-07-06 DIAGNOSIS — Z0000 Encounter for general adult medical examination without abnormal findings: Secondary | ICD-10-CM

## 2014-07-06 DIAGNOSIS — Z23 Encounter for immunization: Secondary | ICD-10-CM

## 2014-07-06 DIAGNOSIS — E785 Hyperlipidemia, unspecified: Secondary | ICD-10-CM

## 2014-07-06 DIAGNOSIS — R21 Rash and other nonspecific skin eruption: Secondary | ICD-10-CM

## 2014-07-06 LAB — CBC WITH DIFFERENTIAL/PLATELET
Basophils Absolute: 0.1 10*3/uL (ref 0.0–0.1)
Basophils Relative: 1.3 % (ref 0.0–3.0)
Eosinophils Absolute: 0.2 10*3/uL (ref 0.0–0.7)
Eosinophils Relative: 2.1 % (ref 0.0–5.0)
HCT: 36 % (ref 36.0–46.0)
Hemoglobin: 11.7 g/dL — ABNORMAL LOW (ref 12.0–15.0)
Lymphocytes Relative: 28.3 % (ref 12.0–46.0)
Lymphs Abs: 2.3 10*3/uL (ref 0.7–4.0)
MCHC: 32.5 g/dL (ref 30.0–36.0)
MCV: 89.6 fl (ref 78.0–100.0)
Monocytes Absolute: 0.4 10*3/uL (ref 0.1–1.0)
Monocytes Relative: 5.5 % (ref 3.0–12.0)
Neutro Abs: 5 10*3/uL (ref 1.4–7.7)
Neutrophils Relative %: 62.8 % (ref 43.0–77.0)
Platelets: 56 10*3/uL — ABNORMAL LOW (ref 150.0–400.0)
RBC: 4.02 Mil/uL (ref 3.87–5.11)
RDW: 14.5 % (ref 11.5–15.5)
WBC: 8 10*3/uL (ref 4.0–10.5)

## 2014-07-06 LAB — HEPATIC FUNCTION PANEL
ALT: 9 U/L (ref 0–35)
AST: 17 U/L (ref 0–37)
Albumin: 2.6 g/dL — ABNORMAL LOW (ref 3.5–5.2)
Alkaline Phosphatase: 52 U/L (ref 39–117)
Bilirubin, Direct: 0 mg/dL (ref 0.0–0.3)
Total Bilirubin: 0.2 mg/dL (ref 0.2–1.2)
Total Protein: 6.8 g/dL (ref 6.0–8.3)

## 2014-07-06 LAB — LIPID PANEL
Cholesterol: 310 mg/dL — ABNORMAL HIGH (ref 0–200)
HDL: 42.8 mg/dL (ref >39.00)
LDL Cholesterol: 233 mg/dL — ABNORMAL HIGH (ref 0–99)
NonHDL: 267.2
Total CHOL/HDL Ratio: 7
Triglycerides: 172 mg/dL — ABNORMAL HIGH (ref 0.0–149.0)
VLDL: 34.4 mg/dL (ref 0.0–40.0)

## 2014-07-06 LAB — BASIC METABOLIC PANEL
BUN: 64 mg/dL — ABNORMAL HIGH (ref 6–23)
CO2: 23 mEq/L (ref 19–32)
Calcium: 8.8 mg/dL (ref 8.4–10.5)
Chloride: 109 mEq/L (ref 96–112)
Creatinine, Ser: 7.6 mg/dL (ref 0.4–1.2)
GFR: 6.94 mL/min — CL (ref >60.00)
Glucose, Bld: 122 mg/dL — ABNORMAL HIGH (ref 70–99)
Potassium: 4.4 mEq/L (ref 3.5–5.1)
Sodium: 142 mEq/L (ref 135–145)

## 2014-07-06 LAB — HEMOGLOBIN A1C: Hgb A1c MFr Bld: 9.1 % — ABNORMAL HIGH (ref 4.6–6.5)

## 2014-07-06 LAB — TSH: TSH: 1.84 u[IU]/mL (ref 0.35–4.50)

## 2014-07-06 MED ORDER — AMLODIPINE BESYLATE 10 MG PO TABS: ORAL_TABLET | ORAL | Status: DC

## 2014-07-06 MED ORDER — GABAPENTIN 100 MG PO CAPS: ORAL_CAPSULE | ORAL | Status: DC

## 2014-07-06 MED ORDER — KETOCONAZOLE 2 % EX CREA: 1.0000 "application " | TOPICAL_CREAM | Freq: Every day | CUTANEOUS | Status: DC

## 2014-07-06 NOTE — Assessment & Plan Note (Signed)
?   Fungal rash - for ketocon cr prn, consider derm

## 2014-07-06 NOTE — Assessment & Plan Note (Signed)
stable overall by history and exam, recent data reviewed with pt, and pt to continue medical treatment as before,  to f/u any worsening symptoms or concerns Lab Results  Component Value Date   HGBA1C 8.2* 03/15/2014

## 2014-07-06 NOTE — Assessment & Plan Note (Signed)
Mild eleve today, cont same med,  to f/u any worsening symptoms or concerns

## 2014-07-06 NOTE — Assessment & Plan Note (Signed)

## 2014-07-06 NOTE — Progress Notes (Signed)
Pre visit review using our clinic review tool, if applicable. No additional management support is needed unless otherwise documented below in the visit note. 

## 2014-07-06 NOTE — Progress Notes (Signed)
Subjective:    Patient ID: [name], female    DOB: Sep 20, 1950, 63 y.o.   MRN: [id number]  HPI  Here for wellness and f/u;  Overall doing ok;  Pt denies CP, worsening SOB, DOE, wheezing, orthopnea, PND, worsening LE edema, palpitations, dizziness or syncope.  Pt denies neurological change such as new headache, facial or extremity weakness.  Pt denies polydipsia, polyuria, or low sugar symptoms this visit after med reduced last visit. Pt states overall good compliance with treatment and medications, good tolerability, and has been trying to follow lower cholesterol diet.  Pt denies worsening depressive symptoms, suicidal ideation or panic. No fever, night sweats, wt loss, loss of appetite, or other constitutional symptoms.  Pt states good ability with ADL's, has low fall risk, home safety reviewed and adequate, no other significant changes in hearing or vision, and only occasionally active with exercise.  Took BP meds today just prior to appt.  Has rash to left foot, mild itchy, for several months now some worse, not seeing podiatry recently Past Medical History  Diagnosis Date  . DIABETES MELLITUS, UNCONTROLLED 05/21/2009  . HYPERLIPIDEMIA 03/30/2007  . ANEMIA-IRON DEFICIENCY 01/25/2008  . HYPERTENSION 01/25/2008  . SINUSITIS- ACUTE-NOS 01/25/2008  . SECONDARY HYPERPARATHYROIDISM 05/21/2009  . RENAL INSUFFICIENCY 02/01/2008  . CERVICAL RADICULOPATHY, LEFT 01/25/2008  . BACK PAIN 05/02/2009  . NUMBNESS 05/21/2009  . Allergic rhinitis, cause unspecified 12/08/2010  . Foot ulcer     right lateral malleolus  . Critical lower limb ischemia    Past Surgical History  Procedure Laterality Date  . Av fistula placement Left   . Eye surgery Bilateral     cataracts removed, left eye still has some oil in it.  . Colonoscopy    . Amputation Right 03/15/2014    Procedure: RIGHT  LEG  BELOW KNEE AMPUTATION ;  Surgeon: [name], MD;  Location: [place];  Service: Orthopedics;  Laterality: Right;    reports  that she has never smoked. She has never used smokeless tobacco. She reports that she does not drink alcohol or use illicit drugs. family history includes Cancer in her mother. Allergies  Allergen Reactions  . Oxycodone Itching   Current Outpatient Prescriptions on File Prior to Visit  Medication Sig Dispense Refill  . amLODipine (NORVASC) 10 MG tablet TAKE ONE TABLET BY MOUTH ONCE DAILY 30 tablet 0  . aspirin 81 MG EC tablet Take 81 mg by mouth daily.      . carvedilol (COREG) 12.5 MG tablet TAKE ONE TABLET BY MOUTH TWICE DAILY WITH A MEAL 60 tablet 0  . furosemide (LASIX) 40 MG tablet Take 40 mg by mouth 2 (two) times daily.    [name] gabapentin (NEURONTIN) 100 MG capsule TAKE ONE CAPSULE BY MOUTH TWICE DAILY 60 capsule 0  . glimepiride (AMARYL) 4 MG tablet Take 1/2 tab by mouth per day 15 tablet 11  . calcitRIOL (ROCALTROL) 0.5 MCG capsule Take 0.5 mcg by mouth daily.     . ferrous sulfate (FERROUSUL) 325 (65 FE) MG tablet Take 325 mg by mouth daily with breakfast.    . HYDROcodone-acetaminophen (NORCO/VICODIN) 5-325 MG per tablet Take 1-2 tablets by mouth every 6 (six) hours as needed for moderate pain. 100 tablet 0  . Multiple Vitamin (MULTIVITAMIN WITH MINERALS) TABS tablet Take 1 tablet by mouth daily.    [name] senna (SENOKOT) 8.6 MG TABS tablet Take 1 tablet (8.6 mg total) by mouth daily as needed for mild constipation. 120 each 0  .  simvastatin (ZOCOR) 20 MG tablet Take 1 tablet (20 mg total) by mouth at bedtime. 90 tablet 3   No current facility-administered medications on file prior to visit.    Review of Systems Constitutional: Negative for increased diaphoresis, other activity, appetite or other siginficant weight change  HENT: Negative for worsening hearing loss, ear pain, facial swelling, mouth sores and neck stiffness.   Eyes: Negative for other worsening pain, redness or visual disturbance.  Respiratory: Negative for shortness of breath and wheezing.   Cardiovascular: Negative  for chest pain and palpitations.  Gastrointestinal: Negative for diarrhea, blood in stool, abdominal distention or other pain Genitourinary: Negative for hematuria, flank pain or change in urine volume.  Musculoskeletal: Negative for myalgias or other joint complaints.  Skin: Negative for color change and wound.  Neurological: Negative for syncope and numbness. other than noted Hematological: Negative for adenopathy. or other swelling Psychiatric/Behavioral: Negative for hallucinations, self-injury, decreased concentration or other worsening agitation.      Objective:   Physical Exam BP 170/90 mmHg  Pulse 66  Temp(Src) 97.7 F (36.5 C) (Oral)  Ht 5\' 3"  (1.6 m)  Wt 115 lb 12 oz (52.504 kg)  BMI 20.51 kg/m2  SpO2 99% VS noted,  Constitutional: Pt is oriented to person, place, and time. Appears well-developed and well-nourished.  Head: Normocephalic and atraumatic.  Right Ear: External ear normal.  Left Ear: External ear normal.  Nose: Nose normal.  Mouth/Throat: Oropharynx is clear and moist.  Eyes: Conjunctivae and EOM are normal. Pupils are equal, round, and reactive to light.  Neck: Normal range of motion. Neck supple. No JVD present. No tracheal deviation present.  Cardiovascular: Normal rate, regular rhythm, normal heart sounds and intact distal pulses.   Pulmonary/Chest: Effort normal and breath sounds without rales or wheezing  Abdominal: Soft. Bowel sounds are normal. NT. No HSM  Musculoskeletal: Normal range of motion. Exhibits no edema.  Lymphadenopathy:  Has no cervical adenopathy.  Neurological: Pt is alert and oriented to person, place, and time. Pt has normal reflexes. No cranial nerve deficit. Motor grossly intact Skin: Skin is warm and dry. No rash noted. except for mild silvery darker skin change to dorsal distal foot at MTP's, NT, no ulcer S/p right BKA Psychiatric:  Has mild nervous mood and affect. Behavior is normal.     Assessment & Plan:

## 2014-07-06 NOTE — Telephone Encounter (Signed)
Lab called in critical:  Creatinine at 7.6 GFR at 6.94

## 2014-07-06 NOTE — Patient Instructions (Signed)
You had the flu shot today  Please take all new medication as prescribed - the rash medication (cream); please call for dermatology referral if this is not improved in 1-2 wks  Please make return appt for Nurse Visit for the new Prevnar pneumonia shot in 2 wks  Please continue all other medications as before, and refills have been done if requested.  Please have the pharmacy call with any other refills you may need.  Please continue your efforts at being more active, low cholesterol diet, and weight control.  You are otherwise up to date with prevention measures today.  Please keep your appointments with your specialists as you may have planned  Please go to the LAB in the Basement (turn left off the elevator) for the tests to be done today  You will be contacted by phone if any changes need to be made immediately.  Otherwise, you will receive a letter about your results with an explanation, but please check with MyChart first.  Please remember to sign up for MyChart if you have not done so, as this will be important to you in the future with finding out test results, communicating by private email, and scheduling acute appointments online when needed.  Please return in 6 months, or sooner if needed, with Lab testing done 3-5 days before

## 2014-07-06 NOTE — Assessment & Plan Note (Signed)
stable overall by history and exam, recent data reviewed with pt, and pt to continue medical treatment as before,  to f/u any worsening symptoms or concerns Lab Results  Component Value Date   LDLCALC 101* 12/08/2010

## 2014-07-10 ENCOUNTER — Encounter (HOSPITAL_COMMUNITY)
Admission: RE | Admit: 2014-07-10 | Discharge: 2014-07-10 | Disposition: A | Discharge status: Home / Self Care | Payer: BC Managed Care – PPO | Source: Ambulatory Visit | Attending: Nephrology | Admitting: Nephrology

## 2014-07-10 ENCOUNTER — Encounter: Payer: Self-pay | Admitting: Internal Medicine

## 2014-07-10 DIAGNOSIS — N184 Chronic kidney disease, stage 4 (severe): Secondary | ICD-10-CM | POA: Diagnosis not present

## 2014-07-10 DIAGNOSIS — D631 Anemia in chronic kidney disease: Secondary | ICD-10-CM | POA: Insufficient documentation

## 2014-07-10 LAB — RENAL FUNCTION PANEL
Albumin: 2.7 g/dL — ABNORMAL LOW (ref 3.5–5.2)
Anion gap: 21 — ABNORMAL HIGH (ref 5–15)
BUN: 64 mg/dL — ABNORMAL HIGH (ref 6–23)
CO2: 17 mEq/L — ABNORMAL LOW (ref 19–32)
Calcium: 8.6 mg/dL (ref 8.4–10.5)
Chloride: 101 mEq/L (ref 96–112)
Creatinine, Ser: 7.68 mg/dL — ABNORMAL HIGH (ref 0.50–1.10)
GFR calc Af Amer: 6 mL/min — ABNORMAL LOW (ref >90)
GFR calc non Af Amer: 5 mL/min — ABNORMAL LOW (ref >90)
Glucose, Bld: 239 mg/dL — ABNORMAL HIGH (ref 70–99)
Phosphorus: 8.8 mg/dL — ABNORMAL HIGH (ref 2.3–4.6)
Potassium: 4.1 mEq/L (ref 3.7–5.3)
Sodium: 139 mEq/L (ref 137–147)

## 2014-07-10 LAB — FERRITIN: Ferritin: 1467 ng/mL — ABNORMAL HIGH (ref 10–291)

## 2014-07-10 LAB — IRON AND TIBC
Iron: 114 ug/dL (ref 42–135)
Saturation Ratios: 71 % — ABNORMAL HIGH (ref 20–55)
TIBC: 160 ug/dL — ABNORMAL LOW (ref 250–470)
UIBC: 46 ug/dL — ABNORMAL LOW (ref 125–400)

## 2014-07-10 LAB — POCT HEMOGLOBIN-HEMACUE: Hemoglobin: 10.6 g/dL — ABNORMAL LOW (ref 12.0–15.0)

## 2014-07-10 MED ORDER — EPOETIN ALFA 20000 UNIT/ML IJ SOLN
20000.0000 [IU] | INTRAMUSCULAR | Status: DC
  Administered 2014-07-10: 20000 [IU] via SUBCUTANEOUS

## 2014-07-10 MED ORDER — EPOETIN ALFA 20000 UNIT/ML IJ SOLN
INTRAMUSCULAR | Status: AC
  Filled 2014-07-10: qty 1

## 2014-07-11 ENCOUNTER — Ambulatory Visit: Payer: BC Managed Care – PPO | Attending: Physician Assistant | Admitting: Physical Therapy

## 2014-07-11 ENCOUNTER — Encounter: Payer: Self-pay | Admitting: Physical Therapy

## 2014-07-11 DIAGNOSIS — Z89511 Acquired absence of right leg below knee: Secondary | ICD-10-CM | POA: Diagnosis not present

## 2014-07-11 DIAGNOSIS — R269 Unspecified abnormalities of gait and mobility: Secondary | ICD-10-CM | POA: Diagnosis not present

## 2014-07-11 DIAGNOSIS — R5381 Other malaise: Secondary | ICD-10-CM | POA: Insufficient documentation

## 2014-07-11 DIAGNOSIS — I120 Hypertensive chronic kidney disease with stage 5 chronic kidney disease or end stage renal disease: Secondary | ICD-10-CM | POA: Diagnosis not present

## 2014-07-11 DIAGNOSIS — I998 Other disorder of circulatory system: Secondary | ICD-10-CM | POA: Diagnosis not present

## 2014-07-11 DIAGNOSIS — E1122 Type 2 diabetes mellitus with diabetic chronic kidney disease: Secondary | ICD-10-CM | POA: Insufficient documentation

## 2014-07-11 DIAGNOSIS — N186 End stage renal disease: Secondary | ICD-10-CM | POA: Insufficient documentation

## 2014-07-11 DIAGNOSIS — Z5189 Encounter for other specified aftercare: Secondary | ICD-10-CM | POA: Insufficient documentation

## 2014-07-11 DIAGNOSIS — R6889 Other general symptoms and signs: Secondary | ICD-10-CM

## 2014-07-11 DIAGNOSIS — E213 Hyperparathyroidism, unspecified: Secondary | ICD-10-CM | POA: Diagnosis not present

## 2014-07-11 LAB — PTH, INTACT AND CALCIUM
Calcium, Total (PTH): 8.1 mg/dL — ABNORMAL LOW (ref 8.4–10.5)
PTH: 114 pg/mL — ABNORMAL HIGH (ref 14–64)

## 2014-07-11 NOTE — Patient Instructions (Signed)
Increase wearing of prosthesis to 2 hours 2 times a day. Wear for 2 hours in am. Take off for a couple of hours (liner and prosthesis). Put back on for 2 more hours in the pm.  Adjust sock ply as needed: either up or down depending on swelling in residual limb. The number of sock ply you need with change throughout the day. Adjust to where you are comfortable in the prosthesis, not having pain or pressure at end of limb or at the knee.  Do each exercise 1-2  times per day Do each exercise 10-15 repetitions Hold each exercise for 3-5 seconds to feel your location  AT [place].  USE TAPE ON FLOOR TO MARK THE MIDLINE POSITION. You also should try to feel with your limb pressure in socket.  You are trying to feel with limb what you used to feel with the bottom of your foot.  1. Side to Side Shift: Moving your hips only (not shoulders): move weight onto your left leg, HOLD/FEEL.  Move back to equal weight on each leg, HOLD/FEEL. Move weight onto your right leg, HOLD/FEEL. Move back to equal weight on each leg, HOLD/FEEL. Repeat. 2. Front to Back Shift: Moving your hips only (not shoulders): move your weight forward onto your toes, HOLD/FEEL. Move your weight back to equal Flat Foot on both legs, HOLD/FEEL. Move your weight back onto your heels, HOLD/FEEL. Move your weight back to equal on both legs, HOLD/FEEL. Repeat. 3. Moving Cones / Cups: With equal weight on each leg: Hold on with one hand the first time, then progress to no hand supports. Move cups from one side of sink to the other. Place cups ~2" out of your reach, progress to 10" beyond reach. 4. Overhead/Upward Reaching: alternated reaching up to top cabinets or ceiling if no cabinets present. Keep equal weight on each leg. Start with one hand support on counter while other hand reaches and progress to no hand support with reaching. 5.   Looking Over Shoulders: With equal  weight on each leg: alternate turning to look over your shoulders with one hand support on counter as needed. Shift weight to                       side looking, pull hip then shoulder then head/eyes around to look behind you. Start with one hand support & progress to no hand support.

## 2014-07-11 NOTE — Therapy (Signed)
Physical Therapy Treatment  Patient Details  Name: Carolyn Valenzuela MRN: [id number] Date of Birth: [birth year]  Encounter Date: 07/11/2014      PT End of Session - 07/11/14 1435    Visit Number 2   Number of Visits 17   Date for PT Re-Evaluation 08/23/14   PT Start Time 1231   PT Stop Time 1316   PT Time Calculation (min) 45 min   Equipment Utilized During Treatment Gait belt   Activity Tolerance Patient tolerated treatment well      Past Medical History  Diagnosis Date  . DIABETES MELLITUS, UNCONTROLLED 05/21/2009  . HYPERLIPIDEMIA 03/30/2007  . ANEMIA-IRON DEFICIENCY 01/25/2008  . HYPERTENSION 01/25/2008  . SINUSITIS- ACUTE-NOS 01/25/2008  . SECONDARY HYPERPARATHYROIDISM 05/21/2009  . RENAL INSUFFICIENCY 02/01/2008  . CERVICAL RADICULOPATHY, LEFT 01/25/2008  . BACK PAIN 05/02/2009  . NUMBNESS 05/21/2009  . Allergic rhinitis, cause unspecified 12/08/2010  . Foot ulcer     right lateral malleolus  . Critical lower limb ischemia     Past Surgical History  Procedure Laterality Date  . Av fistula placement Left   . Eye surgery Bilateral     cataracts removed, left eye still has some oil in it.  . Colonoscopy    . Amputation Right 03/15/2014    Procedure: RIGHT  LEG  BELOW KNEE AMPUTATION ;  Surgeon: [name], MD;  Location: [facility];  Service: Orthopedics;  Laterality: Right;    There were no vitals taken for this visit.  Visit Diagnosis:  Activity intolerance  Debility  Abnormality of gait      Subjective Assessment - 07/11/14 1241    Symptoms Walking around home with and with out walker, uses walker  Having some bone pain at end of limb.   Currently in Pain? Yes   Pain Score 4    Pain Location Leg   Pain Orientation Right   Pain Descriptors / Indicators Sore   Pain Type Acute pain;Surgical pain;Phantom pain   Pain Onset More than a month ago   Pain Frequency Intermittent   Aggravating Factors  weight bearing through right prosthesis   Pain Relieving Factors rest,  removing prosthesis            [facility] Adult PT Treatment/Exercise - 07/11/14 1245    Transfers   Transfers Sit to Stand;Stand to Sit   Sit to Stand 5: Supervision;With upper extremity assist;With armrests;From chair/3-in-1   Sit to Stand Details (indicate cue type and reason) cues to scoot forward and for anterior weight shift with standing                Stand to Sit 5: Supervision;With upper extremity assist;With armrests;To chair/3-in-1   Stand to Sit Details cues to reach back for arm rests and to back all the way to the surface before sitting down   Ambulation/Gait   Ambulation/Gait Yes   Ambulation/Gait Assistance 4: Min guard   Ambulation Distance (Feet) 120 Feet  plus 70 feet x2 reps   Assistive device Rolling walker   Gait Pattern Step-through pattern;Decreased stance time - right;Decreased step length - left;Antalgic;Decreased stride length;Narrow base of support;Trunk flexed   Gait velocity decreased   Prosthetics   Prosthetic Care Comments  Re-educated on care/cleaning of prosthesis/liner   Current prosthetic wear tolerance (days/week)  7   Current prosthetic wear tolerance (#hours/day)  1 hours 2x day   Education Provided Residual limb care;Prosthetic cleaning;Ply sock cleaning;Correct ply sock adjustment;Proper wear schedule/adjustment  sink HEP   Person(s)  Educated Patient   Education Method Explanation;Demonstration;Verbal cues;Handout   Education Method Verbalized understanding;Returned demonstration;Tactile cues required;Needs further instruction   Donning Prosthesis Supervision;Minimal assist  assist varied   Doffing Prosthesis Supervision  with cues          PT Education - 07/11/14 1314    Education provided Yes   Education Details HEP at sink (for amputee patients), sock management, wear time increase   Person(s) Educated Patient   Methods Explanation;Demonstration;Handout;Verbal cues   Comprehension Verbalized understanding;Verbal cues required;Need  further instruction          PT Short Term Goals - 07/11/14 1440    PT SHORT TERM GOAL #1   Title Verbalize proper prosthetic care except cues to adjust the number of ply socks. 07/27/2014   Status On-going   PT SHORT TERM GOAL #2   Title Tolerate wear of prosthesis for >8 hours without change in skin integrity nor undue tenderness. 07/27/2014   Status On-going   PT SHORT TERM GOAL #3   Title Ambulate 300 feet with rolling walker and prosthesis modified independent. 07/27/2014   Status On-going   PT SHORT TERM GOAL #4   Title Negotiate ramp, curb, stairs (1 rail) with RW and prosthesis with supervision. 07/27/2014   Status On-going   PT SHORT TERM GOAL #5   Title Perform standing activities for >10 minutes with no pain. 07/27/2014   Status On-going   Additional Short Term Goals   Additional Short Term Goals Yes   PT SHORT TERM GOAL #6   Title Increase BERG balance test score with prosthesis to >/= 35/56. 07/27/2014   Status On-going   PT SHORT TERM GOAL #7   Title Verbalize understanding of fall prevention strategies. 07/27/2014   Status On-going          PT Long Term Goals - 07/11/14 1600    PT LONG TERM GOAL #1   Title Demonstrate and verbalize proper prosthetic care to enable safe utilization of the prosthesis. 08/23/2014   Status On-going   PT LONG TERM GOAL #2   Title Tolerate wear of prosthesis for >90% of awake hours without change in skin integrity nor undue tenderness. 08/23/2014   Status On-going   PT LONG TERM GOAL #3   Title Ambulate 500 feet with LRAD and prosthesis modified independent. 08/23/2014   Status On-going   PT LONG TERM GOAL #4   Title Ambulate 75 feet around household furniture carring cup with single point cane and prosthesis modified independent. 08/23/2014   Status On-going   PT LONG TERM GOAL #5   Title Negotiate ramp, curb, stairs with LRAD and prosthesis modified independent. 08/23/2014   Status On-going   Additional Long Term Goals    Additional Long Term Goals Yes   PT LONG TERM GOAL #6   Title Perform standing activities for >20 minutes with prosthesis with no pain. 08/23/2014   Status On-going   PT LONG TERM GOAL #7   Title Berg Balance Test score with prosthesis >/= 45/56. 08/23/2014   Status On-going   PT LONG TERM GOAL #8   Title Self-report via FOTO improvement >10 points in Functional status. 08/23/2014   Status On-going   PT LONG TERM GOAL  #9   TITLE Demonstrate work simulated tasks wiht prosthesis safely. 08/23/2014   Status On-going          Plan - 07/11/14 1436    Clinical Impression Statement Pt making steady progress toward her goals. Did educate pt on increased weight  bearing on prosthesis to build up tolerance and decrease pain after pt reported walking without AD at home and having end limb pain.                        Pt will benefit from skilled therapeutic intervention in order to improve on the following deficits Abnormal gait;Decreased coordination;Difficulty walking;Decreased endurance;Decreased activity tolerance;Decreased balance;Decreased knowledge of use of DME;Decreased mobility;Decreased strength   Rehab Potential Good   PT Frequency 2x / week   PT Duration 8 weeks   PT Treatment/Interventions Therapeutic activities;Neuromuscular re-education;Gait training;Patient/family education;Therapeutic exercise;ADLs/Self Care Home Management;Other (comment)  HEP, prosthetic training   PT Next Visit Plan continue toward STG's: gait with walker, introduce barriers, balance activities        Problem List Patient Active Problem List   Diagnosis Date Noted  . Rash 07/06/2014  . S/P BKA (below knee amputation) 03/20/2014  . 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 Critical lower limb ischemia 01/09/2014  . Right foot ulcer 11/07/2013  . Toe pain 09/06/2013  . Pre-ulcerative corn or callous 09/06/2013  . Lower extremity pain 09/06/2013  . End stage renal disease 04/06/2012  .  Left low back pain 12/08/2011  . Hip bursitis, left 12/08/2011  . Left leg pain 12/08/2011  . Abdominal pain, bilateral lower quadrant 07/08/2011  . Preventative health care 12/08/2010  . Allergic rhinitis, cause unspecified 12/08/2010  . Diabetes 05/21/2009  . SECONDARY HYPERPARATHYROIDISM 05/21/2009  . NUMBNESS 05/21/2009  . BACK PAIN 05/02/2009  . ANEMIA-IRON DEFICIENCY 01/25/2008  . Essential hypertension 01/25/2008  . CERVICAL RADICULOPATHY, LEFT 01/25/2008  . Hyperlipidemia 03/30/2007    Willow Ora 07/11/2014, 4:09 PM  Willow Ora, PTA, [place] [phone]

## 2014-07-16 ENCOUNTER — Encounter: Payer: Self-pay | Admitting: Physical Therapy

## 2014-07-16 ENCOUNTER — Ambulatory Visit: Payer: BC Managed Care – PPO | Admitting: Physical Therapy

## 2014-07-16 DIAGNOSIS — R269 Unspecified abnormalities of gait and mobility: Secondary | ICD-10-CM

## 2014-07-16 DIAGNOSIS — Z5189 Encounter for other specified aftercare: Secondary | ICD-10-CM | POA: Diagnosis not present

## 2014-07-16 DIAGNOSIS — R6889 Other general symptoms and signs: Secondary | ICD-10-CM

## 2014-07-16 DIAGNOSIS — R5381 Other malaise: Secondary | ICD-10-CM

## 2014-07-16 NOTE — Therapy (Signed)
Physical Therapy Treatment  Patient Details  Name: Carolyn Valenzuela MRN: [id number] Date of Birth: [birth year]  Encounter Date: 07/16/2014      PT End of Session - 07/16/14 0855    Visit Number 3   Number of Visits 17   Date for PT Re-Evaluation 08/23/14   PT Start Time 0845   PT Stop Time 0930   PT Time Calculation (min) 45 min   Equipment Utilized During Treatment Gait belt   Activity Tolerance Patient tolerated treatment well   Behavior During Therapy [facility] for tasks assessed/performed      Past Medical History  Diagnosis Date  . DIABETES MELLITUS, UNCONTROLLED 05/21/2009  . HYPERLIPIDEMIA 03/30/2007  . ANEMIA-IRON DEFICIENCY 01/25/2008  . HYPERTENSION 01/25/2008  . SINUSITIS- ACUTE-NOS 01/25/2008  . SECONDARY HYPERPARATHYROIDISM 05/21/2009  . RENAL INSUFFICIENCY 02/01/2008  . CERVICAL RADICULOPATHY, LEFT 01/25/2008  . BACK PAIN 05/02/2009  . NUMBNESS 05/21/2009  . Allergic rhinitis, cause unspecified 12/08/2010  . Foot ulcer     right lateral malleolus  . Critical lower limb ischemia     Past Surgical History  Procedure Laterality Date  . Av fistula placement Left   . Eye surgery Bilateral     cataracts removed, left eye still has some oil in it.  . Colonoscopy    . Amputation Right 03/15/2014    Procedure: RIGHT  LEG  BELOW KNEE AMPUTATION ;  Surgeon: [name], MD;  Location: [place];  Service: Orthopedics;  Laterality: Right;    There were no vitals taken for this visit.  Visit Diagnosis:  Activity intolerance  Debility  Abnormality of gait      Subjective Assessment - 07/16/14 0852    Symptoms No new complaints. Reports no falls. Using walker/prosthesis everywhere she goes, no wheelchair.   Currently in Pain? No/denies            [facility] Adult PT Treatment/Exercise - 07/16/14 0859    Transfers   Sit to Stand 5: Supervision   Sit to Stand Details (indicate cue type and reason) cues to scoot to edge of chair before standing up. demo'd safe hand placement with  transfers   Stand to Sit 5: Supervision   Stand to Sit Details cues to back all the way to surface and reach back before sitting down   Ambulation/Gait   Ambulation/Gait Yes   Ambulation/Gait Assistance 5: Supervision   Ambulation/Gait Assistance Details cues on posture, walker position and to increase BOS with gait.   Ambulation Distance (Feet) 330 Feet  400 feet plus ramp/curb x2 and stairs   Assistive device Rolling walker   Gait Pattern Step-through pattern;Decreased stride length;Narrow base of support   Gait velocity WFL   Stairs Yes   Stairs Assistance 4: Min guard   Stair Management Technique Two rails;Step to pattern;Forwards   Number of Stairs 4   Ramp 4: Min assist  x2 reps   Ramp Details (indicate cue type and reason) with RW/prosthesis, cues on sequence and technique   Curb 4: Min assist  x2 reps   Curb Details (indicate cue type and reason) with RW/prosthesis, cues on sequence and technique    Dynamic Standing Balance   Dynamic Standing - Balance Support During functional activity;No upper extremity supported   Dynamic Standing - Level of Assistance 4: Min assist   Dynamic Standing - Balance Activities Rocker board  both ways on rocker board   Dynamic Standing - Comments static hold, hold with head turns, head nods, and alternating UE raises  both ways on board with up to min assist for balance and cues on posture and foot placement on the board.                 Prosthetics   Current prosthetic wear tolerance (days/week)  7 days/week   Current prosthetic wear tolerance (#hours/day)  2 hours 2x day   Residual limb condition  intact per patient   Donning Prosthesis Modified independent (device/increased time)   Doffing Prosthesis Modified independent (device/increased time)            PT Short Term Goals - 07/16/14 1829    PT SHORT TERM GOAL #1   Title Verbalize proper prosthetic care except cues to adjust the number of ply socks. 07/27/2014   Status On-going    PT SHORT TERM GOAL #2   Title Tolerate wear of prosthesis for >8 hours without change in skin integrity nor undue tenderness. 07/27/2014   Status On-going   PT SHORT TERM GOAL #3   Title Ambulate 300 feet with rolling walker and prosthesis modified independent. 07/27/2014   Status Partially Met   PT SHORT TERM GOAL #4   Title Negotiate ramp, curb, stairs (1 rail) with RW and prosthesis with supervision. 07/27/2014   Status Partially Met   PT SHORT TERM GOAL #5   Title Perform standing activities for >10 minutes with no pain. 07/27/2014   Status On-going   PT SHORT TERM GOAL #6   Title Increase BERG balance test score with prosthesis to >/= 35/56. 07/27/2014   Status On-going   PT SHORT TERM GOAL #7   Title Verbalize understanding of fall prevention strategies. 07/27/2014   Status On-going          PT Long Term Goals - 07/16/14 0939    PT LONG TERM GOAL #1   Title Demonstrate and verbalize proper prosthetic care to enable safe utilization of the prosthesis. 08/23/2014   Status On-going   PT LONG TERM GOAL #2   Title Tolerate wear of prosthesis for >90% of awake hours without change in skin integrity nor undue tenderness. 08/23/2014   Status On-going   PT LONG TERM GOAL #3   Title Ambulate 500 feet with LRAD and prosthesis modified independent. 08/23/2014   Status On-going   PT LONG TERM GOAL #4   Title Ambulate 75 feet around household furniture carring cup with single point cane and prosthesis modified independent. 08/23/2014   Status On-going   PT LONG TERM GOAL #5   Title Negotiate ramp, curb, stairs with LRAD and prosthesis modified independent. 08/23/2014   Status On-going   PT LONG TERM GOAL #6   Title Perform standing activities for >20 minutes with prosthesis with no pain. 08/23/2014   PT LONG TERM GOAL #7   Title Berg Balance Test score with prosthesis >/= 45/56. 08/23/2014   Status On-going   PT LONG TERM GOAL #8   Title Self-report via FOTO improvement >10  points in Functional status. 08/23/2014   Status On-going   PT LONG TERM GOAL  #9   TITLE Demonstrate work simulated tasks wiht prosthesis safely. 08/23/2014   Status On-going          Plan - 07/16/14 0936    Clinical Impression Statement Pt making great progress with mobility and general use of prosthesis with mobility.    Pt will benefit from skilled therapeutic intervention in order to improve on the following deficits Abnormal gait;Decreased coordination;Difficulty walking;Decreased endurance;Decreased activity tolerance;Decreased balance;Decreased knowledge of use of DME;Decreased  mobility;Decreased strength   Rehab Potential Good   PT Frequency 2x / week   PT Duration 8 weeks   PT Treatment/Interventions Therapeutic activities;Neuromuscular re-education;Gait training;Patient/family education;Therapeutic exercise;ADLs/Self Care Home Management;Other (comment)  HEP, prosthetic training   PT Next Visit Plan increase pt's wear time with prosthesis if pt continues to have no pain/skin issues with current wear times;continue with gait and balance activities.   PT Home Exercise Plan continue with current Sink HEP for balance and proprioception   Consulted and Agree with Plan of Care Patient        Problem List Patient Active Problem List   Diagnosis Date Noted  . Rash 07/06/2014  . S/P BKA (below knee amputation) 03/20/2014  . 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 Critical lower limb ischemia 01/09/2014  . Right foot ulcer 11/07/2013  . Toe pain 09/06/2013  . Pre-ulcerative corn or callous 09/06/2013  . Lower extremity pain 09/06/2013  . End stage renal disease 04/06/2012  . Left low back pain 12/08/2011  . Hip bursitis, left 12/08/2011  . Left leg pain 12/08/2011  . Abdominal pain, bilateral lower quadrant 07/08/2011  . Preventative health care 12/08/2010  . Allergic rhinitis, cause unspecified 12/08/2010  . Diabetes 05/21/2009  . SECONDARY  HYPERPARATHYROIDISM 05/21/2009  . NUMBNESS 05/21/2009  . BACK PAIN 05/02/2009  . ANEMIA-IRON DEFICIENCY 01/25/2008  . Essential hypertension 01/25/2008  . CERVICAL RADICULOPATHY, LEFT 01/25/2008  . Hyperlipidemia 03/30/2007           Willow Ora 07/16/2014, 9:40 AM Willow Ora, PTA, [facility] [address], [place],  [address] [phone] 07/16/2014, 9:41 AM

## 2014-07-18 ENCOUNTER — Ambulatory Visit: Payer: BC Managed Care – PPO | Admitting: Physical Therapy

## 2014-07-18 DIAGNOSIS — R269 Unspecified abnormalities of gait and mobility: Secondary | ICD-10-CM

## 2014-07-18 DIAGNOSIS — Z5189 Encounter for other specified aftercare: Secondary | ICD-10-CM | POA: Diagnosis not present

## 2014-07-18 DIAGNOSIS — Z89511 Acquired absence of right leg below knee: Secondary | ICD-10-CM

## 2014-07-18 DIAGNOSIS — R6889 Other general symptoms and signs: Secondary | ICD-10-CM

## 2014-07-18 NOTE — Therapy (Signed)
Physical Therapy Treatment  Patient Details  Name: Carolyn Valenzuela MRN: [id number] Date of Birth: 03/21/1951  Encounter Date: 07/18/2014      PT End of Session - 07/18/14 1131    Visit Number 4   Number of Visits 17   Date for PT Re-Evaluation 08/23/14   PT Start Time 0853   PT Stop Time 0940   PT Time Calculation (min) 47 min   Equipment Utilized During Treatment Gait belt;Other (comment)  BKA prosthesis   Activity Tolerance Patient tolerated treatment well   Behavior During Therapy [facility] for tasks assessed/performed      Past Medical History  Diagnosis Date  . DIABETES MELLITUS, UNCONTROLLED 05/21/2009  . HYPERLIPIDEMIA 03/30/2007  . ANEMIA-IRON DEFICIENCY 01/25/2008  . HYPERTENSION 01/25/2008  . SINUSITIS- ACUTE-NOS 01/25/2008  . SECONDARY HYPERPARATHYROIDISM 05/21/2009  . RENAL INSUFFICIENCY 02/01/2008  . CERVICAL RADICULOPATHY, LEFT 01/25/2008  . BACK PAIN 05/02/2009  . NUMBNESS 05/21/2009  . Allergic rhinitis, cause unspecified 12/08/2010  . Foot ulcer     right lateral malleolus  . Critical lower limb ischemia     Past Surgical History  Procedure Laterality Date  . Av fistula placement Left   . Eye surgery Bilateral     cataracts removed, left eye still has some oil in it.  . Colonoscopy    . Amputation Right 03/15/2014    Procedure: RIGHT  LEG  BELOW KNEE AMPUTATION ;  Surgeon: [name], MD;  Location: [place];  Service: Orthopedics;  Laterality: Right;    There were no vitals taken for this visit.  Visit Diagnosis:  Activity intolerance  Abnormality of gait  Status post below knee amputation of right lower extremity      Subjective Assessment - 07/18/14 0859    Symptoms Wearing prosthesis 2 hours2x/day as directed. No issues.   Patient Stated Goals To use prosthesis to walk in community & return to work   Currently in Pain? Yes   Pain Score 4    Pain Location Leg   Pain Orientation Right   Pain Descriptors / Indicators Sore   Pain Type Phantom pain   Pain Onset More than a month ago   Pain Frequency Intermittent   Aggravating Factors  Weight bearing through prosthesis   Pain Relieving Factors rest, removing prosthesis   Effect of Pain on Daily Activities limiting standing some   Multiple Pain Sites No            [facility] Adult PT Treatment/Exercise - 07/18/14 0845    Transfers   Transfers Sit to Stand;Stand to Sit   Sit to Stand 5: Supervision   Sit to Stand Details (indicate cue type and reason) cues on technique   Stand to Sit 5: Supervision   Stand to Sit Details cues on technique /safety   Ambulation/Gait   Ambulation/Gait Yes   Ambulation/Gait Assistance 5: Supervision   Ambulation Distance (Feet) 200 Feet  multiple short 40' to instruct adjusting ply socks   Assistive device Rolling walker;Other (Comment)  prosthesis   Gait Pattern Step-through pattern;Decreased stance time - right   Stairs Yes   Stairs Assistance 5: Supervision   Stairs Assistance Details (indicate cue type and reason) sequence   Stair Management Technique Two rails   Number of Stairs 4   Ramp 5: Supervision   Ramp Details (indicate cue type and reason) With RW & prosthesis, cues on technique   Curb 5: Supervision   Curb Details (indicate cue type and reason) with RW & prosthesis,  cues on technique   Balance   Balance Assessed Yes   Dynamic Standing Balance   Dynamic Standing - Balance Support No upper extremity supported;During functional activity;Left upper extremity supported  reaching to floor no UE & reaching in cabinets with 1 UE   Dynamic Standing - Level of Assistance 5: Stand by assistance;Other (comment)  cues on technique with prosthesis   Dynamic Standing - Balance Activities Reaching for objects   Dynamic Standing - Comments verbalized & return demo reaching to floor & in cabinets   Prosthetics   Prosthetic Care Comments  Re-educated   Current prosthetic wear tolerance (days/week)  7 days /wk   Current prosthetic wear tolerance  (#hours/day)  2 hours 2x day  PT increased to 3 hours 2x/day   Current prosthetic weight-bearing tolerance (hours/day)  8 minutes   Residual limb condition  PT removed suture penetrating skin with tweezers   Education Provided Proper wear schedule/adjustment;Correct ply sock adjustment;Other (comment)  Increase wear to 3hrs 2x/day   Person(s) Educated Patient   Education Method Explanation;Demonstration;Tactile cues   Education Method Verbalized understanding;Returned demonstration;Needs further instruction   Donning Prosthesis Supervision  cues on liner alignment /orientation   Doffing Prosthesis Modified independent (device/increased time)          PT Education - 07/18/14 1128    Education provided Yes   Education Details Increase wear to 3 hours 2x/day, Kitchen activities /progressively increasing standing time to progress towards return to work   [employer]) Educated Patient   Methods Explanation   Comprehension Verbalized understanding;Returned demonstration;Need further instruction          PT Short Term Goals - 07/18/14 1131    PT SHORT TERM GOAL #1   Title Verbalize proper prosthetic care except cues to adjust the number of ply socks. 07/27/2014   Status On-going   PT SHORT TERM GOAL #2   Title Tolerate wear of prosthesis for >8 hours without change in skin integrity nor undue tenderness. 07/27/2014   Status On-going   PT SHORT TERM GOAL #3   Title Ambulate 300 feet with rolling walker and prosthesis modified independent. 07/27/2014   Status Partially Met   PT SHORT TERM GOAL #4   Title Negotiate ramp, curb, stairs (1 rail) with RW and prosthesis with supervision. 07/27/2014   Status Partially Met   PT SHORT TERM GOAL #5   Title Perform standing activities for >10 minutes with no pain. 07/27/2014   Status On-going   PT SHORT TERM GOAL #6   Title Increase BERG balance test score with prosthesis to >/= 35/56. 07/27/2014   Status On-going   PT SHORT TERM GOAL #7    Title Verbalize understanding of fall prevention strategies. 07/27/2014   Status On-going          PT Long Term Goals - 07/18/14 1130    PT LONG TERM GOAL #1   Title Demonstrate and verbalize proper prosthetic care to enable safe utilization of the prosthesis. 08/23/2014   Status On-going   PT LONG TERM GOAL #2   Title Tolerate wear of prosthesis for >90% of awake hours without change in skin integrity nor undue tenderness. 08/23/2014   Status On-going   PT LONG TERM GOAL #3   Title Ambulate 500 feet with LRAD and prosthesis modified independent. 08/23/2014   Status On-going   PT LONG TERM GOAL #4   Title Ambulate 75 feet around household furniture carring cup with single point cane and prosthesis modified independent. 08/23/2014   Status  On-going   PT LONG TERM GOAL #5   Title Negotiate ramp, curb, stairs with LRAD and prosthesis modified independent. 08/23/2014   Status On-going   PT LONG TERM GOAL #6   Title Perform standing activities for >20 minutes with prosthesis with no pain. 08/23/2014   PT LONG TERM GOAL #7   Title Berg Balance Test score with prosthesis >/= 45/56. 08/23/2014   Status On-going   PT LONG TERM GOAL #8   Title Self-report via FOTO improvement >10 points in Functional status. 08/23/2014   Status On-going   PT LONG TERM GOAL  #9   TITLE Demonstrate work simulated tasks wiht prosthesis safely. 08/23/2014   Status On-going          Plan - 07/18/14 0845    Clinical Impression Statement Patient appears to have better understanding of how to adjust ply socks. She was able to return demo negotiating barriers with RW & prosthesis.   Pt will benefit from skilled therapeutic intervention in order to improve on the following deficits Abnormal gait;Decreased coordination;Difficulty walking;Decreased endurance;Decreased activity tolerance;Decreased balance;Decreased knowledge of use of DME;Decreased mobility;Decreased strength;Other (comment)  prosthetic dependency    Rehab Potential Good   PT Frequency 2x / week   PT Duration 8 weeks   PT Treatment/Interventions Therapeutic activities;Neuromuscular re-education;Gait training;Patient/family education;Therapeutic exercise;ADLs/Self Care Home Management;Other (comment)  HEP, prosthetic training   PT Next Visit Plan progress to single point cane for gait. Dynamic balance activities   Consulted and Agree with Plan of Care Patient        Problem List Patient Active Problem List   Diagnosis Date Noted  . Rash 07/06/2014  . S/P BKA (below knee amputation) 03/20/2014  . 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 Critical lower limb ischemia 01/09/2014  . Right foot ulcer 11/07/2013  . Toe pain 09/06/2013  . Pre-ulcerative corn or callous 09/06/2013  . Lower extremity pain 09/06/2013  . End stage renal disease 04/06/2012  . Left low back pain 12/08/2011  . Hip bursitis, left 12/08/2011  . Left leg pain 12/08/2011  . Abdominal pain, bilateral lower quadrant 07/08/2011  . Preventative health care 12/08/2010  . Allergic rhinitis, cause unspecified 12/08/2010  . Diabetes 05/21/2009  . SECONDARY HYPERPARATHYROIDISM 05/21/2009  . NUMBNESS 05/21/2009  . BACK PAIN 05/02/2009  . ANEMIA-IRON DEFICIENCY 01/25/2008  . Essential hypertension 01/25/2008  . CERVICAL RADICULOPATHY, LEFT 01/25/2008  . Hyperlipidemia 03/30/2007        Jhamir Pickup 07/18/2014, 11:39 AM  Jamey Reas, PT, DPT Physical Therapist Specializing in Prosthetics & Limb Loss Care Phone: [phone] FAX: [phone] [address] [place], [place] [address]

## 2014-07-19 ENCOUNTER — Ambulatory Visit: Payer: BC Managed Care – PPO | Admitting: Podiatry

## 2014-07-20 ENCOUNTER — Ambulatory Visit (INDEPENDENT_AMBULATORY_CARE_PROVIDER_SITE_OTHER): Payer: BC Managed Care – PPO | Admitting: *Deleted

## 2014-07-20 DIAGNOSIS — Z23 Encounter for immunization: Secondary | ICD-10-CM

## 2014-07-23 ENCOUNTER — Ambulatory Visit: Payer: BC Managed Care – PPO | Admitting: Physical Therapy

## 2014-07-23 DIAGNOSIS — R269 Unspecified abnormalities of gait and mobility: Secondary | ICD-10-CM

## 2014-07-23 DIAGNOSIS — R6889 Other general symptoms and signs: Secondary | ICD-10-CM

## 2014-07-23 DIAGNOSIS — Z5189 Encounter for other specified aftercare: Secondary | ICD-10-CM | POA: Diagnosis not present

## 2014-07-23 DIAGNOSIS — Z89511 Acquired absence of right leg below knee: Secondary | ICD-10-CM

## 2014-07-23 NOTE — Therapy (Signed)
Physical Therapy Treatment  Patient Details  Name: Carolyn Valenzuela MRN: [id number] Date of Birth: 1951/06/19  Encounter Date: 07/23/2014      PT End of Session - 07/23/14 1156    Visit Number 5   Number of Visits 17   Date for PT Re-Evaluation 08/23/14   PT Start Time 0852   PT Stop Time 0931   PT Time Calculation (min) 39 min   Equipment Utilized During Treatment Gait belt   Activity Tolerance Patient tolerated treatment well   Behavior During Therapy [facility] for tasks assessed/performed      Past Medical History  Diagnosis Date  . DIABETES MELLITUS, UNCONTROLLED 05/21/2009  . HYPERLIPIDEMIA 03/30/2007  . ANEMIA-IRON DEFICIENCY 01/25/2008  . HYPERTENSION 01/25/2008  . SINUSITIS- ACUTE-NOS 01/25/2008  . SECONDARY HYPERPARATHYROIDISM 05/21/2009  . RENAL INSUFFICIENCY 02/01/2008  . CERVICAL RADICULOPATHY, LEFT 01/25/2008  . BACK PAIN 05/02/2009  . NUMBNESS 05/21/2009  . Allergic rhinitis, cause unspecified 12/08/2010  . Foot ulcer     right lateral malleolus  . Critical lower limb ischemia     Past Surgical History  Procedure Laterality Date  . Av fistula placement Left   . Eye surgery Bilateral     cataracts removed, left eye still has some oil in it.  . Colonoscopy    . Amputation Right 03/15/2014    Procedure: RIGHT  LEG  BELOW KNEE AMPUTATION ;  Surgeon: [name], MD;  Location: [place];  Service: Orthopedics;  Laterality: Right;    There were no vitals taken for this visit.  Visit Diagnosis:  Activity intolerance  Abnormality of gait  Status post below knee amputation of right lower extremity      Subjective Assessment - 07/23/14 0855    Symptoms Pt states her distal tibia on her amputated leg that lasted for first half hour to hour after she donned her prosthesis that is not present now. Stated in the middle of the night she took shrinker sock off in the middle of the night and left off until this morning.   Limitations Walking;Standing   Patient Stated Goals To use  prosthesis to walk in community & return to work   Currently in Pain? No/denies            [facility] Adult PT Treatment/Exercise - 07/23/14 0845    Transfers   Transfers Sit to Stand;Stand to Sit;Stand Pivot Transfers   Sit to Stand 7:independent   Sit to Stand Details (indicate cue type and reason) able to perform with minimal use of hands   Stand to Sit 7: independent   Stand to Sit Details able to complete with minimal use of hands   Stand Pivot Transfers 5: Supervision   Stand Pivot Transfer Details (indicate cue type and reason) able to complete with minimal use of hands   Ambulation/Gait   Ambulation/Gait Yes   Ambulation/Gait Assistance 6: Modified independent (Device/Increase time);4: Min assist  Modified independent w/ RW ambulation; min Asst w/ single point cane   Ambulation/Gait Assistance Details no cueing required with RW. Cues for sequencing, cane placement, and step length when ambulating with cane.   Ambulation Distance (Feet) 482 Feet  x1 w/ RW; 125 x1 w/ SPC   Assistive device Rolling walker;Other (Comment);Straight cane  RLE below knee prosthesis   Gait Pattern Step-through pattern;Decreased step length - left;Decreased stance time - right   Gait velocity WFL   Stairs Yes   Stairs Assistance 6: Modified independent (Device/Increase time)   Stairs Assistance Details (indicate  cue type and reason) patient able to complete safely with 1 rail and step to gait pattern   Stair Management Technique One rail Right;Step to pattern   Number of Stairs 4   Ramp 6: Modified independent (Device)   Ramp Details (indicate cue type and reason) with RW and prosthesis, decreased gait speed and stride length for safety.   Curb 5: Supervision   Curb Details (indicate cue type and reason) with RW and prosthesis. pt cued on transitioning RW from curb down to floor to not roll RW over the edge for safety.   Balance   Balance Assessed Yes   Standardized Balance Assessment   Standardized  Balance Assessment Berg Balance Test   Berg Balance Test   Sit to Stand Able to stand without using hands and stabilize independently   Standing Unsupported Able to stand safely 2 minutes   Sitting with Back Unsupported but Feet Supported on Floor or Stool Able to sit safely and securely 2 minutes   Stand to Sit Sits safely with minimal use of hands   Transfers Able to transfer safely, minor use of hands   Standing Unsupported with Eyes Closed Able to stand 10 seconds safely   Standing Ubsupported with Feet Together Able to place feet together independently and stand 1 minute safely   From Standing, Reach Forward with Outstretched Arm Can reach confidently >25 cm (10")   From Standing Position, Pick up Object from Floor Able to pick up shoe safely and easily   From Standing Position, Turn to Look Behind Over each Shoulder Looks behind from both sides and weight shifts well   Turn 360 Degrees Able to turn 360 degrees safely but slowly   Standing Unsupported, Alternately Place Feet on Step/Stool Needs assistance to keep from falling or unable to try   Standing Unsupported, One Foot in Front Able to take small step independently and hold 30 seconds   Standing on One Leg Tries to lift leg/unable to hold 3 seconds but remains standing independently   Total Score 45   Prosthetics   Prosthetic Care Comments  sock adjustment, increase prosthetic wear time to 4 hours 2x/day drying q2hrs,    Current prosthetic wear tolerance (days/week)  7 days /wk   Current prosthetic wear tolerance (#hours/day)  3 hours 2x day   Current prosthetic weight-bearing tolerance (hours/day)  10 minutes   Education Provided Proper wear schedule/adjustment;Correct ply sock adjustment especially in morning if shrinker off at night, increase wear to 4 hours 2x/day,    Person(s) Educated Patient   Education Method Explanation;Demonstration   Education Method Verbalized understanding          PT Education - 07/23/14 1156     Education provided Yes   Education Details Increase wear time, 4 hours 2x/day; prosthetic care education   Person(s) Educated Patient   Methods Explanation   Comprehension Verbalized understanding;Returned demonstration          PT Short Term Goals - 07/23/14 0845    PT SHORT TERM GOAL #1   Title Verbalize proper prosthetic care except cues to adjust the number of ply socks. 07/27/2014   Status Achieved   PT SHORT TERM GOAL #2   Title Tolerate wear of prosthesis for >8 hours without change in skin integrity nor undue tenderness. 07/27/2014   Status On-going   PT SHORT TERM GOAL #3   Title Ambulate 300 feet with rolling walker and prosthesis modified independent. 07/27/2014   Status Achieved   PT SHORT  TERM GOAL #4   Title Negotiate ramp, curb, stairs (1 rail) with RW and prosthesis with supervision. 07/27/2014   Status Achieved   PT SHORT TERM GOAL #5   Title Perform standing activities for >10 minutes with no pain. 07/27/2014   Status Achieved   PT SHORT TERM GOAL #6   Title Increase BERG balance test score with prosthesis to >/= 35/56. 07/27/2014   Status Achieved   PT SHORT TERM GOAL #7   Title Verbalize understanding of fall prevention strategies. 07/27/2014   Status Achieved            Plan - 07/23/14 1157    Clinical Impression Statement Patient showed increase independence with mobility. Met 6-7 short term goals and able to walk 125 ft with single point cane on first attempt with minimal assistance for balance and safety.   Pt will benefit from skilled therapeutic intervention in order to improve on the following deficits Abnormal gait;Decreased coordination;Difficulty walking;Decreased endurance;Decreased activity tolerance;Decreased balance;Decreased knowledge of use of DME;Decreased mobility;Decreased strength   PT Frequency 2x / week   PT Duration 8 weeks   PT Treatment/Interventions Therapeutic activities;Neuromuscular re-education;Gait  training;Patient/family education;Therapeutic exercise;ADLs/Self Care Home Management;Balance training;Energy conservation  prosthetic training   PT Next Visit Plan Continue moblity training with single point cane (curb/ramp/steps); increase ambulation distance with single point cane.    Consulted and Agree with Plan of Care Patient        Problem List Patient Active Problem List   Diagnosis Date Noted  . Rash 07/06/2014  . S/P BKA (below knee amputation) 03/20/2014  . 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 Critical lower limb ischemia 01/09/2014  . Right foot ulcer 11/07/2013  . Toe pain 09/06/2013  . Pre-ulcerative corn or callous 09/06/2013  . Lower extremity pain 09/06/2013  . End stage renal disease 04/06/2012  . Left low back pain 12/08/2011  . Hip bursitis, left 12/08/2011  . Left leg pain 12/08/2011  . Abdominal pain, bilateral lower quadrant 07/08/2011  . Preventative health care 12/08/2010  . Allergic rhinitis, cause unspecified 12/08/2010  . Diabetes 05/21/2009  . SECONDARY HYPERPARATHYROIDISM 05/21/2009  . NUMBNESS 05/21/2009  . BACK PAIN 05/02/2009  . ANEMIA-IRON DEFICIENCY 01/25/2008  . Essential hypertension 01/25/2008  . CERVICAL RADICULOPATHY, LEFT 01/25/2008  . Hyperlipidemia 03/30/2007       All PT delivered under supervision of Licensed Physical Therapist.   Blima Rich, Student PT 07/23/2014, 12:04 PM  This entire session was performed under direct supervision and direction of a licensed therapist/therapist assistant . I have personally read, edited and approve of the note as written.  Jamey Reas, PT, DPT Physical Therapist Specializing in Prosthetics & Limb Loss Care Phone: [phone] FAX: [phone] [address] [place], [place] [address]

## 2014-07-24 ENCOUNTER — Encounter: Payer: Self-pay | Admitting: Physical Therapy

## 2014-07-24 ENCOUNTER — Ambulatory Visit: Payer: BC Managed Care – PPO | Admitting: Physical Therapy

## 2014-07-24 DIAGNOSIS — R6889 Other general symptoms and signs: Secondary | ICD-10-CM

## 2014-07-24 DIAGNOSIS — Z5189 Encounter for other specified aftercare: Secondary | ICD-10-CM | POA: Diagnosis not present

## 2014-07-24 DIAGNOSIS — R5381 Other malaise: Secondary | ICD-10-CM

## 2014-07-24 DIAGNOSIS — R269 Unspecified abnormalities of gait and mobility: Secondary | ICD-10-CM

## 2014-07-24 NOTE — Therapy (Signed)
Physical Therapy Treatment  Patient Details  Name: Carolyn Valenzuela MRN: [id number] Date of Birth: 06-14-1951  Encounter Date: 07/24/2014      PT End of Session - 07/24/14 1313    Visit Number 6   Number of Visits 17   Date for PT Re-Evaluation 08/23/14   PT Start Time 0847   PT Stop Time 0928   PT Time Calculation (min) 41 min   Equipment Utilized During Treatment Gait belt   Activity Tolerance Patient tolerated treatment well   Behavior During Therapy [facility] for tasks assessed/performed      Past Medical History  Diagnosis Date  . DIABETES MELLITUS, UNCONTROLLED 05/21/2009  . HYPERLIPIDEMIA 03/30/2007  . ANEMIA-IRON DEFICIENCY 01/25/2008  . HYPERTENSION 01/25/2008  . SINUSITIS- ACUTE-NOS 01/25/2008  . SECONDARY HYPERPARATHYROIDISM 05/21/2009  . RENAL INSUFFICIENCY 02/01/2008  . CERVICAL RADICULOPATHY, LEFT 01/25/2008  . BACK PAIN 05/02/2009  . NUMBNESS 05/21/2009  . Allergic rhinitis, cause unspecified 12/08/2010  . Foot ulcer     right lateral malleolus  . Critical lower limb ischemia     Past Surgical History  Procedure Laterality Date  . Av fistula placement Left   . Eye surgery Bilateral     cataracts removed, left eye still has some oil in it.  . Colonoscopy    . Amputation Right 03/15/2014    Procedure: RIGHT  LEG  BELOW KNEE AMPUTATION ;  Surgeon: [name], MD;  Location: [place];  Service: Orthopedics;  Laterality: Right;    There were no vitals taken for this visit.  Visit Diagnosis:  Activity intolerance  Abnormality of gait  Debility      Subjective Assessment - 07/24/14 0853    Symptoms Having pain at knee/patella area of amputated limb. No falls to report. Felt okay after last session.   Currently in Pain? Yes   Pain Score 4    Pain Location Knee   Pain Orientation Right   Pain Descriptors / Indicators Sore   Pain Type Phantom pain;Acute pain   Pain Onset 1 to 4 weeks ago   Pain Frequency Intermittent   Aggravating Factors  weight bearing through  prosthesis   Pain Relieving Factors rest, sock management, removing prosthesis            [facility] Adult PT Treatment/Exercise - 07/24/14 0903    Transfers   Sit to Stand 5: Supervision;With upper extremity assist;From chair/3-in-1   Sit to Stand Details (indicate cue type and reason) cues to scoot forward and on foot placement with standing   Stand to Sit 5: Supervision;With upper extremity assist;To chair/3-in-1   Stand to Sit Details cues to reach back and ensure all the way at surface before sitting down   Ambulation/Gait   Ambulation/Gait Yes   Ambulation/Gait Assistance 6: Modified independent (Device/Increase time);4: Min guard;4: Min assist   Ambulation/Gait Assistance Details cues on posture and walker position. cues on posture, sequence and cane placement.   Ambulation Distance (Feet) 120 Feet  x2 with RW, 120 ft x3 with straight cane   Assistive device Rolling walker;Straight cane   Gait Pattern Step-through pattern;Decreased stride length;Decreased stance time - right;Decreased step length - right;Antalgic;Trunk flexed;Narrow base of support   Prosthetics   Prosthetic Care Comments  sock management, correct prosthetic wear/alingment   Current prosthetic wear tolerance (days/week)  7 days /wk   Current prosthetic wear tolerance (#hours/day)  4 hrs 2x day  started yesterday   Residual limb condition  Skin checked, no issues noted   Education  Provided Correct ply sock adjustment;Proper wear schedule/adjustment;Proper weight-bearing schedule/adjustment   Person(s) Educated Patient   Education Method Explanation;Demonstration;Verbal cues   Education Method Verbalized understanding;Needs further instruction   Donning Prosthesis Supervision   Doffing Prosthesis Modified independent (device/increased time)              PT Short Term Goals - 07/24/14 1455    PT SHORT TERM GOAL #1   Title Verbalize proper prosthetic care except cues to adjust the number of ply socks.  07/27/2014   Status Achieved   PT SHORT TERM GOAL #2   Title Tolerate wear of prosthesis for >8 hours without change in skin integrity nor undue tenderness. 07/27/2014   Status On-going   PT SHORT TERM GOAL #3   Title Ambulate 300 feet with rolling walker and prosthesis modified independent. 07/27/2014   Status Achieved   PT SHORT TERM GOAL #4   Title Negotiate ramp, curb, stairs (1 rail) with RW and prosthesis with supervision. 07/27/2014   Status Achieved   PT SHORT TERM GOAL #5   Title Perform standing activities for >10 minutes with no pain. 07/27/2014   Status Achieved   PT SHORT TERM GOAL #6   Title Increase BERG balance test score with prosthesis to >/= 35/56. 07/27/2014   Status Achieved   PT SHORT TERM GOAL #7   Title Verbalize understanding of fall prevention strategies. 07/27/2014   Status Achieved          PT Long Term Goals - 07/24/14 1455    PT LONG TERM GOAL #1   Title Demonstrate and verbalize proper prosthetic care to enable safe utilization of the prosthesis. 08/23/2014   Status On-going   PT LONG TERM GOAL #2   Title Tolerate wear of prosthesis for >90% of awake hours without change in skin integrity nor undue tenderness. 08/23/2014   Status On-going   PT LONG TERM GOAL #3   Title Ambulate 500 feet with LRAD and prosthesis modified independent. 08/23/2014   Status On-going   PT LONG TERM GOAL #4   Title Ambulate 75 feet around household furniture carring cup with single point cane and prosthesis modified independent. 08/23/2014   Status On-going   PT LONG TERM GOAL #5   Title Negotiate ramp, curb, stairs with LRAD and prosthesis modified independent. 08/23/2014   Status On-going   PT LONG TERM GOAL #6   Title Perform standing activities for >20 minutes with prosthesis with no pain. 08/23/2014   PT LONG TERM GOAL #7   Title Berg Balance Test score with prosthesis >/= 45/56. 08/23/2014   Status On-going   PT LONG TERM GOAL #8   Title Self-report via  FOTO improvement >10 points in Functional status. 08/23/2014   Status On-going   PT LONG TERM GOAL  #9   TITLE Demonstrate work simulated tasks wiht prosthesis safely. 08/23/2014   Status On-going          Plan - 07/24/14 1313    Clinical Impression Statement Educated pt on sock management and importance of gradually increasing weight bearing on residual limb through prosthesis to limit pain on residual limb. Pt with decreased balance and activity tolerance today vs last visit. Slow progress toward goals today with limited carryover of education provided on last session noted today.                       Pt will benefit from skilled therapeutic intervention in order to improve on the following deficits Abnormal  gait;Decreased coordination;Difficulty walking;Decreased endurance;Decreased activity tolerance;Decreased balance;Decreased knowledge of use of DME;Decreased mobility;Decreased strength   Rehab Potential Good   PT Treatment/Interventions Therapeutic activities;Neuromuscular re-education;Gait training;Patient/family education;Therapeutic exercise;ADLs/Self Care Home Management;Balance training;Energy conservation;Other (comment)  prosthetic training   PT Next Visit Plan Continue moblity training with single point cane (curb/ramp/steps); increase ambulation distance with single point cane as pt's pain allows   PT Home Exercise Plan continue with current Sink HEP for balance and proprioception   Consulted and Agree with Plan of Care Patient        Problem List Patient Active Problem List   Diagnosis Date Noted  . Rash 07/06/2014  . S/P BKA (below knee amputation) 03/20/2014  . 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 Critical lower limb ischemia 01/09/2014  . Right foot ulcer 11/07/2013  . Toe pain 09/06/2013  . Pre-ulcerative corn or callous 09/06/2013  . Lower extremity pain 09/06/2013  . End stage renal disease 04/06/2012  . Left low back pain 12/08/2011  .  Hip bursitis, left 12/08/2011  . Left leg pain 12/08/2011  . Abdominal pain, bilateral lower quadrant 07/08/2011  . Preventative health care 12/08/2010  . Allergic rhinitis, cause unspecified 12/08/2010  . Diabetes 05/21/2009  . SECONDARY HYPERPARATHYROIDISM 05/21/2009  . NUMBNESS 05/21/2009  . BACK PAIN 05/02/2009  . ANEMIA-IRON DEFICIENCY 01/25/2008  . Essential hypertension 01/25/2008  . CERVICAL RADICULOPATHY, LEFT 01/25/2008  . Hyperlipidemia 03/30/2007       Willow Ora 07/24/2014, 2:56 PM  Willow Ora, PTA, [facility] [place] [address] [place], [place] [address] [phone] 07/24/2014, 2:57 PM

## 2014-07-30 ENCOUNTER — Ambulatory Visit: Payer: BC Managed Care – PPO | Admitting: Physical Therapy

## 2014-07-30 ENCOUNTER — Encounter: Payer: Self-pay | Admitting: Physical Therapy

## 2014-07-30 DIAGNOSIS — R269 Unspecified abnormalities of gait and mobility: Secondary | ICD-10-CM

## 2014-07-30 DIAGNOSIS — Z5189 Encounter for other specified aftercare: Secondary | ICD-10-CM | POA: Diagnosis not present

## 2014-07-30 DIAGNOSIS — Z89511 Acquired absence of right leg below knee: Secondary | ICD-10-CM

## 2014-07-30 DIAGNOSIS — R6889 Other general symptoms and signs: Secondary | ICD-10-CM

## 2014-07-30 NOTE — Therapy (Signed)
Physical Therapy Treatment  Patient Details  Name: Carolyn Valenzuela MRN: [id number] Date of Birth: 01-29-1951  Encounter Date: 07/30/2014      PT End of Session - 07/30/14 1110    Visit Number 7   Number of Visits 17   Date for PT Re-Evaluation 08/23/14   PT Start Time 0940   PT Stop Time 1016   PT Time Calculation (min) 36 min   Equipment Utilized During Treatment Gait belt   Activity Tolerance Patient tolerated treatment well   Behavior During Therapy [facility] for tasks assessed/performed      Past Medical History  Diagnosis Date  . DIABETES MELLITUS, UNCONTROLLED 05/21/2009  . HYPERLIPIDEMIA 03/30/2007  . ANEMIA-IRON DEFICIENCY 01/25/2008  . HYPERTENSION 01/25/2008  . SINUSITIS- ACUTE-NOS 01/25/2008  . SECONDARY HYPERPARATHYROIDISM 05/21/2009  . RENAL INSUFFICIENCY 02/01/2008  . CERVICAL RADICULOPATHY, LEFT 01/25/2008  . BACK PAIN 05/02/2009  . NUMBNESS 05/21/2009  . Allergic rhinitis, cause unspecified 12/08/2010  . Foot ulcer     right lateral malleolus  . Critical lower limb ischemia     Past Surgical History  Procedure Laterality Date  . Av fistula placement Left   . Eye surgery Bilateral     cataracts removed, left eye still has some oil in it.  . Colonoscopy    . Amputation Right 03/15/2014    Procedure: RIGHT  LEG  BELOW KNEE AMPUTATION ;  Surgeon: [name], MD;  Location: [place];  Service: Orthopedics;  Laterality: Right;    There were no vitals taken for this visit.  Visit Diagnosis:  Activity intolerance  Abnormality of gait  Status post below knee amputation of right lower extremity      Subjective Assessment - 07/30/14 0955    Symptoms Has had minor pain at the end of wear time in distal tibia on RLE, not currently in any pain. Bought a small based quad cane at drug store yesterday.    Currently in Pain? No/denies            [facility] Adult PT Treatment/Exercise - 07/30/14 0930    Transfers   Transfers Sit to Stand;Stand to Sit;Stand Pivot Transfers   Sit to Stand 5: Supervision;With upper extremity assist;From chair/3-in-1  without armrests; RLE below knee prosthesis   Sit to Stand Details (indicate cue type and reason) requires increased time to complete, cues on placement of cane in preparation to being amublation   Stand to Sit 5: Supervision;Without upper extremity assist;To chair/3-in-1  without armrests; RLE below knee prosthesis   Ambulation/Gait   Ambulation/Gait Yes   Ambulation/Gait Assistance 5: Supervision;4: Min guard   Ambulation/Gait Assistance Details patient ambulated into therapy with newly purchased small based quad cane demonstrating decreased safety with cane placement in LLE line of progression during swing phase and variable gait pattern. PT had patient ambulate with multiple canes (tripod, single point, Hurrycane) to determine what cane patient preferred, and what cane patient ambulated with highest gait quality and safety. The patient preferred the [facility] which she used with an increased candence and gait veloctiy appearing to be relatively stable. Patient appeared more stable with single point cane demonstrating a more symmetrical and consistent gait pattern.   Ambulation Distance (Feet) 500' total (quad cane 125' x1, [facility] 125' x1; hurrycane 125' x1; tripod 125' x1)   Assistive device Straight cane;Small based quad cane  hurrycane; tripod tip cane; R BKA prosthesis   Gait Pattern Step-to pattern;Step-through pattern;Decreased step length - left;Decreased stance time - right;Narrow base of support;Trunk flexed  most symmetrical gait w/ single point cane   Stairs Yes   Stairs Assistance 6: Modified independent (Device/Increase time)   Stairs Assistance Details (indicate cue type and reason) sequencing   Stair Management Technique One rail Right;Alternating pattern;Forwards  prosthesis on RLE   Number of Stairs 4   Ramp 4: Min assist  min guard   Ramp Details (indicate cue type and reason) hurrycane and R BKA  prosthesis   Curb 4: Min assist   Curb Details (indicate cue type and reason) requires increased time and minimal assistance to step up curb with only 1 UE support on cane   Prosthetics   Prosthetic Care Comments  Educated on prosthetic wear time, sock adjustment, and correct donning in regards to proper pin alignment and making sure liner is donned without air pocket present. Increase wear to 5 hours 2x/day drying q 2.5 hours & prn   Current prosthetic wear tolerance (days/week)  7 days /wk   Current prosthetic wear tolerance (#hours/day)  4 hrs 2x day   Residual limb condition  Skin checked, no issues noted   Education Provided Skin check;Correct ply sock adjustment;Proper wear schedule/adjustment   Person(s) Educated Patient   Education Method Explanation   Education Method Verbalized understanding   Donning Prosthesis Minimal assist  for correct pin orientation   Doffing Prosthesis Modified independent (device/increased time)          PT Education - 07/30/14 1109    Education provided Yes   Education Details Ambulation w/ various canes. Ambulation w/ cane sequencing. See prosthetic instructions. Return quad cane as not best cane for her gait.   Person(s) Educated Patient   Methods Explanation;Demonstration   Comprehension Verbalized understanding;Need further instruction         PT Short Term Goals - 07/30/14 0930    PT SHORT TERM GOAL #1   Title Verbalize proper prosthetic care except cues to adjust the number of ply socks. 07/27/2014   Status Achieved   PT SHORT TERM GOAL #2   Title Tolerate wear of prosthesis for >8 hours without change in skin integrity nor undue tenderness. 07/27/2014   Status On-going   PT SHORT TERM GOAL #3   Title Ambulate 300 feet with rolling walker and prosthesis modified independent. 07/27/2014   Status Achieved   PT SHORT TERM GOAL #4   Title Negotiate ramp, curb, stairs (1 rail) with RW and prosthesis with supervision. 07/27/2014   Status  Achieved   PT SHORT TERM GOAL #5   Title Perform standing activities for >10 minutes with no pain. 07/27/2014   Status Achieved   PT SHORT TERM GOAL #6   Title Increase BERG balance test score with prosthesis to >/= 35/56. 07/27/2014   Status Achieved   PT SHORT TERM GOAL #7   Title Verbalize understanding of fall prevention strategies. 07/27/2014   Status Achieved          PT Long Term Goals - 07/30/14 0930    PT LONG TERM GOAL #1   Title Demonstrate and verbalize proper prosthetic care to enable safe utilization of the prosthesis. 08/23/2014   Status On-going   PT LONG TERM GOAL #2   Title Tolerate wear of prosthesis for >90% of awake hours without change in skin integrity nor undue tenderness. 08/23/2014   Status On-going   PT LONG TERM GOAL #3   Title Ambulate 500 feet with LRAD and prosthesis modified independent. 08/23/2014   Status On-going   PT LONG TERM GOAL #4  Title Ambulate 75 feet around household furniture carring cup with single point cane and prosthesis modified independent. 08/23/2014   Status On-going   PT LONG TERM GOAL #5   Title Negotiate ramp, curb, stairs with LRAD and prosthesis modified independent. 08/23/2014   Status On-going   PT LONG TERM GOAL #6   Title Perform standing activities for >20 minutes with prosthesis with no pain. 08/23/2014   PT LONG TERM GOAL #7   Title Berg Balance Test score with prosthesis >/= 45/56. 08/23/2014   Status On-going   PT LONG TERM GOAL #8   Title Self-report via FOTO improvement >10 points in Functional status. 08/23/2014   Status On-going   PT LONG TERM GOAL  #9   TITLE Demonstrate work simulated tasks wiht prosthesis safely. 08/23/2014   Status On-going          Plan - 07/30/14 1111    Clinical Impression Statement Patient performed well in therapy today, demonstrating ability to navigate ramp and curb with hurrycane requiring minimal assistance. While ambulating with various smaller based canes, the  patient demonstrated safety and minimal need for physical assistance but ambulates with variable gait requiring continued practice focusing on more symmetrical gait pattern. The patient appeared most confident ambulating with the hurrycane ambulating with increased gait speed, but appeared more stable and symmetrical when ambulating with the single point cane.     Pt will benefit from skilled therapeutic intervention in order to improve on the following deficits Abnormal gait;Decreased coordination;Difficulty walking;Decreased endurance;Decreased activity tolerance;Decreased balance;Decreased knowledge of use of DME;Decreased mobility;Decreased strength   Rehab Potential Good   PT Frequency 2x / week   PT Duration 8 weeks   PT Treatment/Interventions Therapeutic activities;Neuromuscular re-education;Gait training;Patient/family education;Therapeutic exercise;ADLs/Self Care Home Management;Balance training;Energy conservation;Other (comment)  prosthetic training   PT Next Visit Plan Continue mobility assessment using tripod tip, hurry cane, and single point cane to determine patient preference; mobility training curb/ramp and stairs training with no rails.   Consulted and Agree with Plan of Care Patient        Problem List Patient Active Problem List   Diagnosis Date Noted  . Rash 07/06/2014  . S/P BKA (below knee amputation) 03/20/2014  . 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 Critical lower limb ischemia 01/09/2014  . Right foot ulcer 11/07/2013  . Toe pain 09/06/2013  . Pre-ulcerative corn or callous 09/06/2013  . Lower extremity pain 09/06/2013  . End stage renal disease 04/06/2012  . Left low back pain 12/08/2011  . Hip bursitis, left 12/08/2011  . Left leg pain 12/08/2011  . Abdominal pain, bilateral lower quadrant 07/08/2011  . Preventative health care 12/08/2010  . Allergic rhinitis, cause unspecified 12/08/2010  . Diabetes 05/21/2009  . SECONDARY  HYPERPARATHYROIDISM 05/21/2009  . NUMBNESS 05/21/2009  . BACK PAIN 05/02/2009  . ANEMIA-IRON DEFICIENCY 01/25/2008  . Essential hypertension 01/25/2008  . CERVICAL RADICULOPATHY, LEFT 01/25/2008  . Hyperlipidemia 03/30/2007         All PT delivered under supervision of Licensed Physical Therapist.     Blima Rich; Student PT 07/30/2014, 1:49 PM    This entire session was performed under direct supervision and direction of a licensed therapist/therapist assistant . I have personally read, edited and approve of the note as written.  Jamey Reas, PT, DPT 07/30/2014 2:26 PM Phone:  [phone]  Fax:  [phone]

## 2014-07-31 ENCOUNTER — Other Ambulatory Visit: Payer: Self-pay | Admitting: Physical Medicine & Rehabilitation

## 2014-07-31 ENCOUNTER — Other Ambulatory Visit: Payer: Self-pay | Admitting: Internal Medicine

## 2014-08-01 ENCOUNTER — Encounter: Payer: Self-pay | Admitting: Physical Therapy

## 2014-08-01 ENCOUNTER — Ambulatory Visit: Payer: BC Managed Care – PPO | Attending: Physician Assistant | Admitting: Physical Therapy

## 2014-08-01 DIAGNOSIS — I998 Other disorder of circulatory system: Secondary | ICD-10-CM | POA: Diagnosis not present

## 2014-08-01 DIAGNOSIS — I120 Hypertensive chronic kidney disease with stage 5 chronic kidney disease or end stage renal disease: Secondary | ICD-10-CM | POA: Insufficient documentation

## 2014-08-01 DIAGNOSIS — E1122 Type 2 diabetes mellitus with diabetic chronic kidney disease: Secondary | ICD-10-CM | POA: Diagnosis not present

## 2014-08-01 DIAGNOSIS — E213 Hyperparathyroidism, unspecified: Secondary | ICD-10-CM | POA: Diagnosis not present

## 2014-08-01 DIAGNOSIS — Z5189 Encounter for other specified aftercare: Secondary | ICD-10-CM | POA: Diagnosis present

## 2014-08-01 DIAGNOSIS — Z89511 Acquired absence of right leg below knee: Secondary | ICD-10-CM

## 2014-08-01 DIAGNOSIS — R269 Unspecified abnormalities of gait and mobility: Secondary | ICD-10-CM | POA: Insufficient documentation

## 2014-08-01 DIAGNOSIS — N186 End stage renal disease: Secondary | ICD-10-CM | POA: Diagnosis not present

## 2014-08-01 DIAGNOSIS — R5381 Other malaise: Secondary | ICD-10-CM | POA: Diagnosis not present

## 2014-08-01 DIAGNOSIS — R6889 Other general symptoms and signs: Secondary | ICD-10-CM

## 2014-08-02 NOTE — Therapy (Signed)
[facility] [address] [place], [place], [address] Phone: [phone]   Fax:  [phone]  Physical Therapy Treatment  Patient Details  Name: Carolyn Valenzuela MRN: [id number] Date of Birth: April 06, 1951  Encounter Date: 08/01/2014      PT End of Session - 08/01/14 1257    Visit Number 8   Number of Visits 17   Date for PT Re-Evaluation 08/23/14   PT Start Time 0940  pt running late for apt today   PT Stop Time 1015   PT Time Calculation (min) 35 min   Equipment Utilized During Treatment Gait belt   Activity Tolerance Patient tolerated treatment well   Behavior During Therapy [facility] for tasks assessed/performed      Past Medical History  Diagnosis Date  . DIABETES MELLITUS, UNCONTROLLED 05/21/2009  . HYPERLIPIDEMIA 03/30/2007  . ANEMIA-IRON DEFICIENCY 01/25/2008  . HYPERTENSION 01/25/2008  . SINUSITIS- ACUTE-NOS 01/25/2008  . SECONDARY HYPERPARATHYROIDISM 05/21/2009  . RENAL INSUFFICIENCY 02/01/2008  . CERVICAL RADICULOPATHY, LEFT 01/25/2008  . BACK PAIN 05/02/2009  . NUMBNESS 05/21/2009  . Allergic rhinitis, cause unspecified 12/08/2010  . Foot ulcer     right lateral malleolus  . Critical lower limb ischemia     Past Surgical History  Procedure Laterality Date  . Av fistula placement Left   . Eye surgery Bilateral     cataracts removed, left eye still has some oil in it.  . Colonoscopy    . Amputation Right 03/15/2014    Procedure: RIGHT  LEG  BELOW KNEE AMPUTATION ;  Surgeon: [name], MD;  Location: [place];  Service: Orthopedics;  Laterality: Right;    There were no vitals taken for this visit.  Visit Diagnosis:  Activity intolerance  Abnormality of gait  Debility  Status post below knee amputation of right lower extremity      Subjective Assessment - 08/01/14 0943    Symptoms Reports she had some pain at the distal tibia yesterday after walking " a lot", none today. No falls. To clinic today with walker.    Currently in Pain? No/denies     Educated on correct sock ply adjustment and had pt correct needed sock ply for correct prosthetic fit prior to mobility. Less pain reported.       [facility] Adult PT Treatment/Exercise - 08/01/14 0946    Transfers   Sit to Stand 5: Supervision;With upper extremity assist;Without upper extremity assist;From chair/3-in-1   Sit to Stand Details (indicate cue type and reason) increased time with cues for cane placement with standing up   Stand to Sit 5: Supervision;With upper extremity assist;Without upper extremity assist;To chair/3-in-1   Stand to Sit Details cues to turn and back all the way to the surface before sitting down.   Ambulation/Gait   Ambulation/Gait No   Ambulation/Gait Assistance 5: Supervision;4: Min guard   Ambulation Distance (Feet) 120 Feet  x2  reps, 215 feet x1,    Assistive device Straight cane  single point, hurrycane   Gait Pattern Step-through pattern;Decreased stride length;Narrow base of support;Decreased stance time - right   Gait velocity WFL   Stairs Yes   Stairs Assistance 5: Supervision   Stairs Assistance Details (indicate cue type and reason) needed cues on sequence/technique with one rail/cane   Stair Management Technique One rail Right;With cane;Step to pattern;Forwards   Number of Stairs 4   Ramp --  min guard assist x2 reps   Ramp Details (indicate cue type and reason) with hurrycane/prosthesis. cues on  technique/sequence         Curb 4: Min assist  6 inch curb x2 reps   Curb Details (indicate cue type and reason) with hurrycane/prosthesis, cues on sequence, min assist to assend curb, min guard assist to descend curb   Prosthetics   Current prosthetic wear tolerance (days/week)  7 days /wk   Current prosthetic wear tolerance (#hours/day)  5 hours 2x day   Residual limb condition  no skin issues per pt report   Education Provided Correct ply sock adjustment;Skin check   Person(s) Educated Patient   Education Method  Explanation;Demonstration;Verbal cues   Education Method Verbalized understanding;Needs further instruction   Donning Prosthesis Supervision   Doffing Prosthesis Modified independent (device/increased time)            PT Short Term Goals - 08/01/14 1300    PT SHORT TERM GOAL #1   Title Verbalize proper prosthetic care except cues to adjust the number of ply socks. 07/27/2014   Status Achieved   PT SHORT TERM GOAL #2   Title Tolerate wear of prosthesis for >8 hours without change in skin integrity nor undue tenderness. 07/27/2014   Status On-going   PT SHORT TERM GOAL #3   Title Ambulate 300 feet with rolling walker and prosthesis modified independent. 07/27/2014   Status Achieved   PT SHORT TERM GOAL #4   Title Negotiate ramp, curb, stairs (1 rail) with RW and prosthesis with supervision. 07/27/2014   Status Achieved   PT SHORT TERM GOAL #5   Title Perform standing activities for >10 minutes with no pain. 07/27/2014   Status Achieved   PT SHORT TERM GOAL #6   Title Increase BERG balance test score with prosthesis to >/= 35/56. 07/27/2014   Status Achieved   PT SHORT TERM GOAL #7   Title Verbalize understanding of fall prevention strategies. 07/27/2014   Status Achieved          PT Long Term Goals - 08/01/14 1300    PT LONG TERM GOAL #1   Title Demonstrate and verbalize proper prosthetic care to enable safe utilization of the prosthesis. 08/23/2014   Status On-going   PT LONG TERM GOAL #2   Title Tolerate wear of prosthesis for >90% of awake hours without change in skin integrity nor undue tenderness. 08/23/2014   Status On-going   PT LONG TERM GOAL #3   Title Ambulate 500 feet with LRAD and prosthesis modified independent. 08/23/2014   Status On-going   PT LONG TERM GOAL #4   Title Ambulate 75 feet around household furniture carring cup with single point cane and prosthesis modified independent. 08/23/2014   Status On-going   PT LONG TERM GOAL #5   Title Negotiate  ramp, curb, stairs with LRAD and prosthesis modified independent. 08/23/2014   Status On-going   PT LONG TERM GOAL #6   Title Perform standing activities for >20 minutes with prosthesis with no pain. 08/23/2014   PT LONG TERM GOAL #7   Title Berg Balance Test score with prosthesis >/= 45/56. 08/23/2014   Status On-going   PT LONG TERM GOAL #8   Title Self-report via FOTO improvement >10 points in Functional status. 08/23/2014   Status On-going   PT LONG TERM GOAL  #9   TITLE Demonstrate work simulated tasks wiht prosthesis safely. 08/23/2014   Status On-going          Plan - 08/01/14 1258    Clinical Impression Statement Pt prefers the hurrycane over all other canes  this session. Once cues on cane placement with gait pt was steady and safe with use of hurrycane for gait/ramp/curbs.    Pt will benefit from skilled therapeutic intervention in order to improve on the following deficits Abnormal gait;Decreased coordination;Difficulty walking;Decreased endurance;Decreased activity tolerance;Decreased balance;Decreased knowledge of use of DME;Decreased mobility;Decreased strength  prosthetic dependency   Rehab Potential Good   PT Frequency 2x / week   PT Duration 8 weeks   PT Treatment/Interventions Therapeutic activities;Neuromuscular re-education;Gait training;Patient/family education;Therapeutic exercise;ADLs/Self Care Home Management;Balance training;Energy conservation;Other (comment)  prosthetic training   PT Next Visit Plan Continue to work on gait with cane and balance/strenghening activities.   Consulted and Agree with Plan of Care Patient         Problem List Patient Active Problem List   Diagnosis Date Noted  . Rash 07/06/2014  . S/P BKA (below knee amputation) 03/20/2014  . 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 Critical lower limb ischemia 01/09/2014  . Right foot ulcer 11/07/2013  . Toe pain 09/06/2013  . Pre-ulcerative corn or callous 09/06/2013   . Lower extremity pain 09/06/2013  . End stage renal disease 04/06/2012  . Left low back pain 12/08/2011  . Hip bursitis, left 12/08/2011  . Left leg pain 12/08/2011  . Abdominal pain, bilateral lower quadrant 07/08/2011  . Preventative health care 12/08/2010  . Allergic rhinitis, cause unspecified 12/08/2010  . Diabetes 05/21/2009  . SECONDARY HYPERPARATHYROIDISM 05/21/2009  . NUMBNESS 05/21/2009  . BACK PAIN 05/02/2009  . ANEMIA-IRON DEFICIENCY 01/25/2008  . Essential hypertension 01/25/2008  . CERVICAL RADICULOPATHY, LEFT 01/25/2008  . Hyperlipidemia 03/30/2007    Willow Ora 08/02/2014, 1:01 PM  Willow Ora, PTA, [place] [address] [place], [place] [address] [phone] 08/02/2014, 1:03 PM

## 2014-08-06 ENCOUNTER — Encounter: Payer: Self-pay | Admitting: Physical Therapy

## 2014-08-06 ENCOUNTER — Ambulatory Visit: Payer: BC Managed Care – PPO | Admitting: Physical Therapy

## 2014-08-06 DIAGNOSIS — R269 Unspecified abnormalities of gait and mobility: Secondary | ICD-10-CM

## 2014-08-06 DIAGNOSIS — Z5189 Encounter for other specified aftercare: Secondary | ICD-10-CM | POA: Diagnosis not present

## 2014-08-06 DIAGNOSIS — R6889 Other general symptoms and signs: Secondary | ICD-10-CM

## 2014-08-06 DIAGNOSIS — Z89511 Acquired absence of right leg below knee: Secondary | ICD-10-CM

## 2014-08-06 NOTE — Therapy (Signed)
[facility] [address] [place], [place], [address] Phone: [phone]   Fax:  [phone]  Physical Therapy Treatment  Patient Details  Name: Carolyn Valenzuela MRN: [id number] Date of Birth: 1951/05/06  Encounter Date: 08/06/2014      PT End of Session - 08/06/14 1515    Visit Number 9   Number of Visits 17   Date for PT Re-Evaluation 08/23/14   PT Start Time 0930   PT Stop Time 1015   PT Time Calculation (min) 45 min   Equipment Utilized During Treatment Gait belt   Activity Tolerance Patient tolerated treatment well   Behavior During Therapy [facility] for tasks assessed/performed      Past Medical History  Diagnosis Date  . DIABETES MELLITUS, UNCONTROLLED 05/21/2009  . HYPERLIPIDEMIA 03/30/2007  . ANEMIA-IRON DEFICIENCY 01/25/2008  . HYPERTENSION 01/25/2008  . SINUSITIS- ACUTE-NOS 01/25/2008  . SECONDARY HYPERPARATHYROIDISM 05/21/2009  . RENAL INSUFFICIENCY 02/01/2008  . CERVICAL RADICULOPATHY, LEFT 01/25/2008  . BACK PAIN 05/02/2009  . NUMBNESS 05/21/2009  . Allergic rhinitis, cause unspecified 12/08/2010  . Foot ulcer     right lateral malleolus  . Critical lower limb ischemia     Past Surgical History  Procedure Laterality Date  . Av fistula placement Left   . Eye surgery Bilateral     cataracts removed, left eye still has some oil in it.  . Colonoscopy    . Amputation Right 03/15/2014    Procedure: RIGHT  LEG  BELOW KNEE AMPUTATION ;  Surgeon: [name], MD;  Location: [place];  Service: Orthopedics;  Laterality: Right;    There were no vitals taken for this visit.  Visit Diagnosis:  Activity intolerance  Abnormality of gait  Status post below knee amputation of right lower extremity      Subjective Assessment - 08/06/14 0955    Symptoms Reports wearing prosthesis 8 hours total per day but distal tibia is sore.   Currently in Pain? Yes   Pain Score 3    Pain Location Leg  distal tibia residual limb   Pain  Orientation Right   Pain Descriptors / Indicators Sore   Pain Onset In the past 7 days   Aggravating Factors  prosthesis pressure   Pain Relieving Factors rest, realign pin   Effect of Pain on Daily Activities limits standing   Multiple Pain Sites No            [facility] Adult PT Treatment/Exercise - 08/06/14 0930    Transfers   Transfers Sit to Stand;Stand to Sit   Sit to Stand 6: Modified independent (Device/Increase time)   Stand to Sit 6: Modified independent (Device/Increase time)   Ambulation/Gait   Ambulation/Gait Yes   Ambulation/Gait Assistance 5: Supervision   Ambulation Distance (Feet) 350 Feet  carried water cup 150'   Assistive device Straight cane  Hurrycane   Gait Pattern Step-through pattern;Decreased stride length;Narrow base of support;Decreased stance time - right   Stairs Yes   Stairs Assistance 5: Supervision   Stair Management Technique Two rails;Alternating pattern  PT demo /verbally instructed technique with prostheis   Number of Stairs 12   Ramp 4: Min assist   Ramp Details (indicate cue type and reason) Hurrycane   Curb 4: Min assist   Curb Details (indicate cue type and reason) Hurrycane   Dynamic Standing Balance   Dynamic Standing - Level of Assistance 4: Min assist   Dynamic Standing - Balance Activities Rocker board  stabilization with head turns &  wt shifts   Prosthetics   Current prosthetic wear tolerance (days/week)  7 days/wk   Current prosthetic wear tolerance (#hours/day)  5 hours 2x/day   Residual limb condition  slight, non-pitting edema at distal tibia   Education Provided Skin check;Proper wear schedule/adjustment  donning with pin alignmen, sitting with prosthesis supported   Person(s) Educated Patient   Education Method Explanation;Demonstration   Education Method Verbalized understanding;Returned Hotel manager Bike NuStep Level 3 with Bil. UEs & LEs X 67min, speed  >40spm          PT Education - 08/06/14 0930    Education provided Yes   Education Details Donning with pin alignment, supporting prosthesis when sitting   Person(s) Educated Patient   Methods Explanation;Demonstration   Comprehension Verbalized understanding            PT Long Term Goals - 08/06/14 0930    PT LONG TERM GOAL #1   Title Demonstrate and verbalize proper prosthetic care to enable safe utilization of the prosthesis. 08/23/2014   Status On-going   PT LONG TERM GOAL #2   Title Tolerate wear of prosthesis for >90% of awake hours without change in skin integrity nor undue tenderness. 08/23/2014   Status On-going   PT LONG TERM GOAL #3   Title Ambulate 500 feet with LRAD and prosthesis modified independent. 08/23/2014   Status On-going   PT LONG TERM GOAL #4   Title Ambulate 75 feet around household furniture carring cup with single point cane and prosthesis modified independent. 08/23/2014   Status On-going   PT LONG TERM GOAL #5   Title Negotiate ramp, curb, stairs with LRAD and prosthesis modified independent. 08/23/2014   Status On-going   PT LONG TERM GOAL #6   Title Perform standing activities for >20 minutes with prosthesis with no pain. 08/23/2014   PT LONG TERM GOAL #7   Title Berg Balance Test score with prosthesis >/= 45/56. 08/23/2014   Status On-going   PT LONG TERM GOAL #8   Title Self-report via FOTO improvement >10 points in Functional status. 08/23/2014   Status On-going   PT LONG TERM GOAL  #9   TITLE Demonstrate work simulated tasks wiht prosthesis safely. 08/23/2014   Status On-going          Plan - 08/06/14 1517    Clinical Impression Statement Patient has pain at distal tibia possibly from sitting with prosthesis dangling and skin tension from pin alignment. Patient improved gait with Hurrycane.   Pt will benefit from skilled therapeutic intervention in order to improve on the following deficits Abnormal gait;Decreased  coordination;Difficulty walking;Decreased endurance;Decreased activity tolerance;Decreased balance;Decreased knowledge of use of DME;Decreased mobility;Decreased strength   Rehab Potential Good   PT Frequency 2x / week   PT Duration 8 weeks   PT Treatment/Interventions Therapeutic activities;Neuromuscular re-education;Gait training;Patient/family education;Therapeutic exercise;ADLs/Self Care Home Management;Balance training;Energy conservation;Other (comment)   PT Next Visit Plan Continue to work on gait with cane and balance/strenghening activities.   Consulted and Agree with Plan of Care Patient              Problem List Patient Active Problem List   Diagnosis Date Noted  . Rash 07/06/2014  . S/P BKA (below knee amputation) 03/20/2014  . 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 Critical lower limb ischemia 01/09/2014  . Right foot ulcer 11/07/2013  . Toe pain 09/06/2013  . Pre-ulcerative corn or  callous 09/06/2013  . Lower extremity pain 09/06/2013  . End stage renal disease 04/06/2012  . Left low back pain 12/08/2011  . Hip bursitis, left 12/08/2011  . Left leg pain 12/08/2011  . Abdominal pain, bilateral lower quadrant 07/08/2011  . Preventative health care 12/08/2010  . Allergic rhinitis, cause unspecified 12/08/2010  . Diabetes 05/21/2009  . SECONDARY HYPERPARATHYROIDISM 05/21/2009  . NUMBNESS 05/21/2009  . BACK PAIN 05/02/2009  . ANEMIA-IRON DEFICIENCY 01/25/2008  . Essential hypertension 01/25/2008  . CERVICAL RADICULOPATHY, LEFT 01/25/2008  . Hyperlipidemia 03/30/2007    Cornelius Marullo 08/06/2014, 3:20 PM  Jamey Reas, PT, DPT PT Specializing in [place] 08/06/2014 3:21 PM Phone:  [phone]  Fax:  [phone] [place] [address] [place], [place] [address]

## 2014-08-07 ENCOUNTER — Encounter (HOSPITAL_COMMUNITY)
Admission: RE | Admit: 2014-08-07 | Discharge: 2014-08-07 | Disposition: A | Discharge status: Home / Self Care | Payer: BC Managed Care – PPO | Source: Ambulatory Visit | Attending: Nephrology | Admitting: Nephrology

## 2014-08-07 DIAGNOSIS — N184 Chronic kidney disease, stage 4 (severe): Secondary | ICD-10-CM | POA: Insufficient documentation

## 2014-08-07 DIAGNOSIS — D631 Anemia in chronic kidney disease: Secondary | ICD-10-CM | POA: Diagnosis not present

## 2014-08-07 LAB — RENAL FUNCTION PANEL
Albumin: 2.6 g/dL — ABNORMAL LOW (ref 3.5–5.2)
Anion gap: 17 — ABNORMAL HIGH (ref 5–15)
BUN: 58 mg/dL — ABNORMAL HIGH (ref 6–23)
CO2: 18 mEq/L — ABNORMAL LOW (ref 19–32)
Calcium: 7.8 mg/dL — ABNORMAL LOW (ref 8.4–10.5)
Chloride: 103 mEq/L (ref 96–112)
Creatinine, Ser: 7.78 mg/dL — ABNORMAL HIGH (ref 0.50–1.10)
GFR calc Af Amer: 6 mL/min — ABNORMAL LOW (ref >90)
GFR calc non Af Amer: 5 mL/min — ABNORMAL LOW (ref >90)
Glucose, Bld: 195 mg/dL — ABNORMAL HIGH (ref 70–99)
Phosphorus: 9 mg/dL — ABNORMAL HIGH (ref 2.3–4.6)
Potassium: 4.7 mEq/L (ref 3.7–5.3)
Sodium: 138 mEq/L (ref 137–147)

## 2014-08-07 LAB — IRON AND TIBC
Iron: 66 ug/dL (ref 42–135)
Saturation Ratios: 40 % (ref 20–55)
TIBC: 164 ug/dL — ABNORMAL LOW (ref 250–470)
UIBC: 98 ug/dL — ABNORMAL LOW (ref 125–400)

## 2014-08-07 LAB — FERRITIN: Ferritin: 922 ng/mL — ABNORMAL HIGH (ref 10–291)

## 2014-08-07 LAB — POCT HEMOGLOBIN-HEMACUE: Hemoglobin: 9.3 g/dL — ABNORMAL LOW (ref 12.0–15.0)

## 2014-08-07 MED ORDER — EPOETIN ALFA 20000 UNIT/ML IJ SOLN
INTRAMUSCULAR | Status: AC
  Filled 2014-08-07: qty 1

## 2014-08-07 MED ORDER — EPOETIN ALFA 20000 UNIT/ML IJ SOLN
20000.0000 [IU] | INTRAMUSCULAR | Status: DC
  Administered 2014-08-07: 20000 [IU] via SUBCUTANEOUS

## 2014-08-08 ENCOUNTER — Ambulatory Visit: Payer: BC Managed Care – PPO | Admitting: Physical Therapy

## 2014-08-08 ENCOUNTER — Encounter: Payer: Self-pay | Admitting: Physical Therapy

## 2014-08-08 ENCOUNTER — Other Ambulatory Visit: Payer: Self-pay | Admitting: Physical Medicine & Rehabilitation

## 2014-08-08 DIAGNOSIS — Z89511 Acquired absence of right leg below knee: Secondary | ICD-10-CM

## 2014-08-08 DIAGNOSIS — R5381 Other malaise: Secondary | ICD-10-CM

## 2014-08-08 DIAGNOSIS — R269 Unspecified abnormalities of gait and mobility: Secondary | ICD-10-CM

## 2014-08-08 DIAGNOSIS — R6889 Other general symptoms and signs: Secondary | ICD-10-CM

## 2014-08-08 DIAGNOSIS — Z5189 Encounter for other specified aftercare: Secondary | ICD-10-CM | POA: Diagnosis not present

## 2014-08-08 LAB — PTH, INTACT AND CALCIUM
Calcium, Total (PTH): 7.2 mg/dL — ABNORMAL LOW (ref 8.4–10.5)
PTH: 237 pg/mL — ABNORMAL HIGH (ref 14–64)

## 2014-08-08 NOTE — Therapy (Signed)
[facility] [address] [place], [place], [address] Phone: [phone]   Fax:  [phone]  Physical Therapy Treatment  Patient Details  Name: Carolyn Valenzuela MRN: [id number] Date of Birth: [birth year]  Encounter Date: 08/08/2014      PT End of Session - 08/08/14 1634    Visit Number 10   Number of Visits 17   Date for PT Re-Evaluation 08/23/14   PT Start Time 0933   PT Stop Time 1012   PT Time Calculation (min) 39 min   Equipment Utilized During Treatment Gait belt   Activity Tolerance Patient tolerated treatment well   Behavior During Therapy [facility] for tasks assessed/performed      Past Medical History  Diagnosis Date  . DIABETES MELLITUS, UNCONTROLLED 05/21/2009  . HYPERLIPIDEMIA 03/30/2007  . ANEMIA-IRON DEFICIENCY 01/25/2008  . HYPERTENSION 01/25/2008  . SINUSITIS- ACUTE-NOS 01/25/2008  . SECONDARY HYPERPARATHYROIDISM 05/21/2009  . RENAL INSUFFICIENCY 02/01/2008  . CERVICAL RADICULOPATHY, LEFT 01/25/2008  . BACK PAIN 05/02/2009  . NUMBNESS 05/21/2009  . Allergic rhinitis, cause unspecified 12/08/2010  . Foot ulcer     right lateral malleolus  . Critical lower limb ischemia     Past Surgical History  Procedure Laterality Date  . Av fistula placement Left   . Eye surgery Bilateral     cataracts removed, left eye still has some oil in it.  . Colonoscopy    . Amputation Right 03/15/2014    Procedure: RIGHT  LEG  BELOW KNEE AMPUTATION ;  Surgeon: [name], MD;  Location: [place];  Service: Orthopedics;  Laterality: Right;    There were no vitals taken for this visit.  Visit Diagnosis:  Activity intolerance  Abnormality of gait  Status post below knee amputation of right lower extremity  Debility      Subjective Assessment - 08/08/14 0941    Symptoms Reports increased pain and tenderness at distal tibia of residual limb. Has a distictive limp/antalgic gait pattern into clinic with RW today.   Patient Stated Goals To  use prosthesis to walk in community & return to work   Currently in Pain? Yes   Pain Score 4    Pain Location Leg  distal tibia of residual limb   Pain Orientation Right   Pain Descriptors / Indicators Sore;Tender   Pain Type Phantom pain;Acute pain   Pain Onset 1 to 4 weeks ago   Pain Frequency Constant   Aggravating Factors  prosthesis pressure    Pain Relieving Factors removal of prosthesis, rest   Effect of Pain on Daily Activities limited gait, standing and activity at this time            [facility] Adult PT Treatment/Exercise - 08/08/14 0957    Transfers   Sit to Stand 5: Supervision;6: Modified independent (Device/Increase time)   Stand to Sit 5: Supervision;6: Modified independent (Device/Increase time)   Ambulation/Gait   Ambulation/Gait Yes   Ambulation/Gait Assistance 5: Supervision   Ambulation/Gait Assistance Details supervison due to pt with increased pain with weight bearing on prosthesis.   Ambulation Distance (Feet) 100 Feet  x2, 50 feet x2 reps   Assistive device Rolling walker   Gait Pattern Step-through pattern;Antalgic   Knee/Hip Exercises: Aerobic   Stationary Bike Nustep x4 extremeties Level 3 x 10 minutes with goal >/= 55 steps per minute for strengthening and activity tolerance           Prosthetics   Current prosthetic wear tolerance (days/week)  7 days/wk  Current prosthetic wear tolerance (#hours/day)  5 hours 2x/day   Residual limb condition  slight, non-pitting edema at distal tibia   Education Provided Skin check;Correct ply sock adjustment;Proper wear schedule/adjustment;Proper weight-bearing schedule/adjustment   Person(s) Educated Patient   Education Method Explanation;Tactile cues   Education Method Verbalized understanding;Verbal cues required;Needs further instruction   Donning Prosthesis Supervision   Doffing Prosthesis Modified independent (device/increased time)            PT Short Term Goals - 08/08/14 1637    PT SHORT TERM GOAL  #1   Title Verbalize proper prosthetic care except cues to adjust the number of ply socks. 07/27/2014   Status Achieved   PT SHORT TERM GOAL #2   Title Tolerate wear of prosthesis for >8 hours without change in skin integrity nor undue tenderness. 07/27/2014   Status On-going   PT SHORT TERM GOAL #3   Title Ambulate 300 feet with rolling walker and prosthesis modified independent. 07/27/2014   Status Achieved   PT SHORT TERM GOAL #4   Title Negotiate ramp, curb, stairs (1 rail) with RW and prosthesis with supervision. 07/27/2014   Status Achieved   PT SHORT TERM GOAL #5   Title Perform standing activities for >10 minutes with no pain. 07/27/2014   Status Achieved   PT SHORT TERM GOAL #6   Title Increase BERG balance test score with prosthesis to >/= 35/56. 07/27/2014   Status Achieved   PT SHORT TERM GOAL #7   Title Verbalize understanding of fall prevention strategies. 07/27/2014   Status Achieved          PT Long Term Goals - 08/08/14 1637    PT LONG TERM GOAL #1   Title Demonstrate and verbalize proper prosthetic care to enable safe utilization of the prosthesis. 08/23/2014   Status On-going   PT LONG TERM GOAL #2   Title Tolerate wear of prosthesis for >90% of awake hours without change in skin integrity nor undue tenderness. 08/23/2014   Status On-going   PT LONG TERM GOAL #3   Title Ambulate 500 feet with LRAD and prosthesis modified independent. 08/23/2014   Status On-going   PT LONG TERM GOAL #4   Title Ambulate 75 feet around household furniture carring cup with single point cane and prosthesis modified independent. 08/23/2014   Status On-going   PT LONG TERM GOAL #5   Title Negotiate ramp, curb, stairs with LRAD and prosthesis modified independent. 08/23/2014   Status On-going   PT LONG TERM GOAL #6   Title Perform standing activities for >20 minutes with prosthesis with no pain. 08/23/2014   PT LONG TERM GOAL #7   Title Berg Balance Test score with prosthesis  >/= 45/56. 08/23/2014   Status On-going   PT LONG TERM GOAL #8   Title Self-report via FOTO improvement >10 points in Functional status. 08/23/2014   Status On-going   PT LONG TERM GOAL  #9   TITLE Demonstrate work simulated tasks wiht prosthesis safely. 08/23/2014   Status On-going          Plan - 08/08/14 1635    Clinical Impression Statement Pt limited today by pain at distal tibia with weight bearing. Call placed to pt's prosthetist, he was out of the clinic. Pt going to see other prosthetist Gerald Stabs) for application of small/thin tibial pads with grinding out for them to hopefully decrease her pain.  Pt will benefit from skilled therapeutic intervention in order to improve on the following deficits Abnormal gait;Decreased coordination;Difficulty walking;Decreased endurance;Decreased activity tolerance;Decreased balance;Decreased knowledge of use of DME;Decreased mobility;Decreased strength  prosthetic dependency   Rehab Potential Good   PT Frequency 2x / week   PT Duration 8 weeks   PT Treatment/Interventions Therapeutic activities;Neuromuscular re-education;Gait training;Patient/family education;Therapeutic exercise;ADLs/Self Care Home Management;Balance training;Energy conservation;Other (comment)  prosthetic training   PT Next Visit Plan Continue to work on gait with cane and balance/strenghening activities as pain allows   PT Home Exercise Plan continue with current Sink HEP for balance and proprioception   Consulted and Agree with Plan of Care Patient        Problem List Patient Active Problem List   Diagnosis Date Noted  . Rash 07/06/2014  . S/P BKA (below knee amputation) 03/20/2014  . 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 Critical lower limb ischemia 01/09/2014  . Right foot ulcer 11/07/2013  . Toe pain 09/06/2013  . Pre-ulcerative corn or callous 09/06/2013  . Lower extremity pain 09/06/2013  . End stage renal  disease 04/06/2012  . Left low back pain 12/08/2011  . Hip bursitis, left 12/08/2011  . Left leg pain 12/08/2011  . Abdominal pain, bilateral lower quadrant 07/08/2011  . Preventative health care 12/08/2010  . Allergic rhinitis, cause unspecified 12/08/2010  . Diabetes 05/21/2009  . SECONDARY HYPERPARATHYROIDISM 05/21/2009  . NUMBNESS 05/21/2009  . BACK PAIN 05/02/2009  . ANEMIA-IRON DEFICIENCY 01/25/2008  . Essential hypertension 01/25/2008  . CERVICAL RADICULOPATHY, LEFT 01/25/2008  . Hyperlipidemia 03/30/2007    Willow Ora 08/08/2014, 4:38 PM  Willow Ora, PTA, [place] [address] [place], [place] [address] [phone] 08/08/2014, 4:38 PM

## 2014-08-13 ENCOUNTER — Ambulatory Visit: Payer: BC Managed Care – PPO | Admitting: Physical Therapy

## 2014-08-13 ENCOUNTER — Encounter: Payer: Self-pay | Admitting: Physical Therapy

## 2014-08-13 DIAGNOSIS — R6889 Other general symptoms and signs: Secondary | ICD-10-CM

## 2014-08-13 DIAGNOSIS — R269 Unspecified abnormalities of gait and mobility: Secondary | ICD-10-CM

## 2014-08-13 DIAGNOSIS — Z5189 Encounter for other specified aftercare: Secondary | ICD-10-CM | POA: Diagnosis not present

## 2014-08-13 DIAGNOSIS — Z89511 Acquired absence of right leg below knee: Secondary | ICD-10-CM

## 2014-08-13 NOTE — Therapy (Signed)
[facility] [address] [place], [place], [address] Phone: [phone]   Fax:  [phone]  Physical Therapy Treatment  Patient Details  Name: Carolyn Valenzuela MRN: [id number] Date of Birth: 1951-08-18  Encounter Date: 08/13/2014      PT End of Session - 08/13/14 1012    Visit Number 11   Number of Visits 17   Date for PT Re-Evaluation 08/23/14   PT Start Time 0935   PT Stop Time 1000   PT Time Calculation (min) 25 min   Equipment Utilized During Treatment Gait belt   Activity Tolerance Patient limited by pain   Behavior During Therapy [facility] for tasks assessed/performed      Past Medical History  Diagnosis Date  . DIABETES MELLITUS, UNCONTROLLED 05/21/2009  . HYPERLIPIDEMIA 03/30/2007  . ANEMIA-IRON DEFICIENCY 01/25/2008  . HYPERTENSION 01/25/2008  . SINUSITIS- ACUTE-NOS 01/25/2008  . SECONDARY HYPERPARATHYROIDISM 05/21/2009  . RENAL INSUFFICIENCY 02/01/2008  . CERVICAL RADICULOPATHY, LEFT 01/25/2008  . BACK PAIN 05/02/2009  . NUMBNESS 05/21/2009  . Allergic rhinitis, cause unspecified 12/08/2010  . Foot ulcer     right lateral malleolus  . Critical lower limb ischemia     Past Surgical History  Procedure Laterality Date  . Av fistula placement Left   . Eye surgery Bilateral     cataracts removed, left eye still has some oil in it.  . Colonoscopy    . Amputation Right 03/15/2014    Procedure: RIGHT  LEG  BELOW KNEE AMPUTATION ;  Surgeon: [name], MD;  Location: [place];  Service: Orthopedics;  Laterality: Right;    There were no vitals taken for this visit.  Visit Diagnosis:  Activity intolerance  Abnormality of gait  Status post below knee amputation of right lower extremity      Subjective Assessment - 08/13/14 0940    Symptoms End of bone still hurting   Currently in Pain? Yes   Pain Score 4    Pain Location Leg  distal tibia   Pain Orientation Right   Pain Descriptors / Indicators Sore;Tender   Pain Onset 1  to 4 weeks ago   Pain Frequency Constant   Aggravating Factors  only with prosthesis pressure   Pain Relieving Factors removal of prosthesis, sitting without pressure on limb. PT instructed in ice massage prior to donning   Effect of Pain on Daily Activities did not wear prosthesis over the weekend to give it a break   Multiple Pain Sites No            [facility] Adult PT Treatment/Exercise - 08/13/14 0930    Ambulation/Gait   Ambulation/Gait Yes   Ambulation/Gait Assistance 5: Supervision   Ambulation/Gait Assistance Details too painful with cane, SBA with walker   Ambulation Distance (Feet) 100 Feet  with walker with cues on step length   Assistive device Rolling walker  prosthesis   Prosthetics   Prosthetic Care Comments  PT assessed pressure in standing with bilateral stance & terminal stance position  pt reported pressure higher on tibia especially in terminal    Current prosthetic wear tolerance (days/week)  has not worn last 2 days to decrease pain   Residual limb condition  slight, non-pitting edema at distal tibia   Education Provided Skin check;Proper wear schedule/adjustment   Person(s) Educated Patient   Education Method Explanation   Education Method Verbalized understanding          PT Education - 08/13/14 1011    Education provided Yes  Education Details ice massage    Person(s) Educated Patient   Methods Explanation;Demonstration   Comprehension Verbalized understanding            PT Long Term Goals - 08/13/14 0930    PT LONG TERM GOAL #1   Title Demonstrate and verbalize proper prosthetic care to enable safe utilization of the prosthesis. 08/23/2014   Status On-going   PT LONG TERM GOAL #2   Title Tolerate wear of prosthesis for >90% of awake hours without change in skin integrity nor undue tenderness. 08/23/2014   Status On-going   PT LONG TERM GOAL #3   Title Ambulate 500 feet with LRAD and prosthesis modified independent. 08/23/2014   Status  On-going   PT LONG TERM GOAL #4   Title Ambulate 75 feet around household furniture carring cup with single point cane and prosthesis modified independent. 08/23/2014   Status On-going   PT LONG TERM GOAL #5   Title Negotiate ramp, curb, stairs with LRAD and prosthesis modified independent. 08/23/2014   Status On-going   PT LONG TERM GOAL #6   Title Perform standing activities for >20 minutes with prosthesis with no pain. 08/23/2014   PT LONG TERM GOAL #7   Title Berg Balance Test score with prosthesis >/= 45/56. 08/23/2014   Status On-going   PT LONG TERM GOAL #8   Title Self-report via FOTO improvement >10 points in Functional status. 08/23/2014   Status On-going   PT LONG TERM GOAL  #9   TITLE Demonstrate work simulated tasks wiht prosthesis safely. 08/23/2014   Status On-going          Plan - 08/13/14 1012    Clinical Impression Statement Patient has too much pain with weight bearing without maximal UE support. PT called prosthetist to recommend making window in socket more oval to accommodate higher on tibia, check angle as patient changed shoes & ~1/8" higher heal. When there is enough space in socket to consider gastroc pad.   Pt will benefit from skilled therapeutic intervention in order to improve on the following deficits Abnormal gait;Decreased coordination;Difficulty walking;Decreased endurance;Decreased activity tolerance;Decreased balance;Decreased knowledge of use of DME;Decreased mobility;Decreased strength   Rehab Potential Good   PT Frequency 2x / week   PT Treatment/Interventions Therapeutic activities;Neuromuscular re-education;Gait training;Patient/family education;Therapeutic exercise;ADLs/Self Care Home Management;Balance training;Energy conservation;Other (comment)  prosthetic training   PT Next Visit Plan Continue to work on gait with cane and balance/strenghening activities as pain allows   Consulted and Agree with Plan of Care Patient       Problem  List Patient Active Problem List   Diagnosis Date Noted  . Rash 07/06/2014  . S/P BKA (below knee amputation) 03/20/2014  . 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 Critical lower limb ischemia 01/09/2014  . Right foot ulcer 11/07/2013  . Toe pain 09/06/2013  . Pre-ulcerative corn or callous 09/06/2013  . Lower extremity pain 09/06/2013  . End stage renal disease 04/06/2012  . Left low back pain 12/08/2011  . Hip bursitis, left 12/08/2011  . Left leg pain 12/08/2011  . Abdominal pain, bilateral lower quadrant 07/08/2011  . Preventative health care 12/08/2010  . Allergic rhinitis, cause unspecified 12/08/2010  . Diabetes 05/21/2009  . SECONDARY HYPERPARATHYROIDISM 05/21/2009  . NUMBNESS 05/21/2009  . BACK PAIN 05/02/2009  . ANEMIA-IRON DEFICIENCY 01/25/2008  . Essential hypertension 01/25/2008  . CERVICAL RADICULOPATHY, LEFT 01/25/2008  . Hyperlipidemia 03/30/2007    Lanee Chain 08/13/2014, 12:51 PM  Jamey Reas, PT, DPT  PT Specializing in [place] 08/13/2014 12:53 PM Phone:  [phone]  Fax:  [phone] [place] [address] [place], [place] [address]

## 2014-08-15 ENCOUNTER — Encounter: Payer: Self-pay | Admitting: Physical Therapy

## 2014-08-15 ENCOUNTER — Ambulatory Visit: Payer: BC Managed Care – PPO | Admitting: Physical Therapy

## 2014-08-15 DIAGNOSIS — R5381 Other malaise: Secondary | ICD-10-CM

## 2014-08-15 DIAGNOSIS — Z5189 Encounter for other specified aftercare: Secondary | ICD-10-CM | POA: Diagnosis not present

## 2014-08-15 DIAGNOSIS — Z89511 Acquired absence of right leg below knee: Secondary | ICD-10-CM

## 2014-08-15 DIAGNOSIS — R269 Unspecified abnormalities of gait and mobility: Secondary | ICD-10-CM

## 2014-08-15 DIAGNOSIS — R6889 Other general symptoms and signs: Secondary | ICD-10-CM

## 2014-08-15 NOTE — Therapy (Signed)
[facility] [address] [place], [place], [address] Phone: [phone]   Fax:  [phone]  Physical Therapy Treatment  Patient Details  Name: Carolyn Valenzuela MRN: [id number] Date of Birth: March 06, 1951  Encounter Date: 08/15/2014      PT End of Session - 08/15/14 1006    Visit Number 12   Number of Visits 17   Date for PT Re-Evaluation 08/23/14   PT Start Time 0930   Equipment Utilized During Treatment Gait belt   Activity Tolerance Patient limited by pain   Behavior During Therapy [facility] for tasks assessed/performed      Past Medical History  Diagnosis Date  . DIABETES MELLITUS, UNCONTROLLED 05/21/2009  . HYPERLIPIDEMIA 03/30/2007  . ANEMIA-IRON DEFICIENCY 01/25/2008  . HYPERTENSION 01/25/2008  . SINUSITIS- ACUTE-NOS 01/25/2008  . SECONDARY HYPERPARATHYROIDISM 05/21/2009  . RENAL INSUFFICIENCY 02/01/2008  . CERVICAL RADICULOPATHY, LEFT 01/25/2008  . BACK PAIN 05/02/2009  . NUMBNESS 05/21/2009  . Allergic rhinitis, cause unspecified 12/08/2010  . Foot ulcer     right lateral malleolus  . Critical lower limb ischemia     Past Surgical History  Procedure Laterality Date  . Av fistula placement Left   . Eye surgery Bilateral     cataracts removed, left eye still has some oil in it.  . Colonoscopy    . Amputation Right 03/15/2014    Procedure: RIGHT  LEG  BELOW KNEE AMPUTATION ;  Surgeon: [name], MD;  Location: [place];  Service: Orthopedics;  Laterality: Right;    There were no vitals taken for this visit.  Visit Diagnosis:  Activity intolerance  Abnormality of gait  Status post below knee amputation of right lower extremity  Debility      Subjective Assessment - 08/15/14 0934    Symptoms Still having pain at end of limb on tibia, does feel a little better.   Currently in Pain? Yes   Pain Score 3    Pain Location Leg  distal tibia   Pain Orientation Right   Pain Descriptors / Indicators Aching;Sore;Tender   Pain  Type Acute pain;Phantom pain   Pain Onset 1 to 4 weeks ago   Pain Frequency Constant   Aggravating Factors  pressure of prosthesis   Pain Relieving Factors taking prosthesis off, sitting without pressure on limb.            [facility] Adult PT Treatment/Exercise - 08/15/14 0938    Transfers   Sit to Stand 5: Supervision;With upper extremity assist;From chair/3-in-1   Sit to Stand Details (indicate cue type and reason) no cues or physical assist needed   Stand to Sit 5: Supervision;With upper extremity assist;To chair/3-in-1   Stand to Sit Details no cues or physical assist needed   Ambulation/Gait   Ambulation/Gait Yes   Ambulation/Gait Assistance 5: Supervision;4: Min guard   Ambulation/Gait Assistance Details pt starts off with good, equal step length and weight shifting with no sings of antalgic pattern, however around the 200 feet mark developed pain in right residual limb at tibia area creating an antalgic gait with both the cane and then the walker.                           Ambulation Distance (Feet) 230 Feet  x 1 with cane, 210 feet x1 with RW   Assistive device Straight cane   Gait Pattern Step-through pattern;Antalgic  antalgic with last 30 feet only   Gait velocity [facility]  Knee/Hip Exercises: Aerobic   Stationary Bike Nustep x4 extremeties Level 3 x 8 minutes with goal >/= 55 steps per minute for strengthening and activity tolerance           Cryotherapy   Number Minutes Cryotherapy 6 Minutes   Cryotherapy Location Other (comment)  right distal tibia/end of residual limb   Type of Cryotherapy Ice massage   Prosthetics   Current prosthetic wear tolerance (days/week)  wore 8 hrs yesterday, not sure how it will do today with pain   Current prosthetic wear tolerance (#hours/day)  5 hours 2x/day   Residual limb condition  slight, non-pitting edema at distal tibia   Education Provided Correct ply sock adjustment;Proper wear schedule/adjustment  supported rest to decrease pull on  limb/pain   Person(s) Educated Patient   Education Method Explanation;Verbal cues   Education Method Verbalized understanding   Donning Prosthesis Supervision   Doffing Prosthesis Modified independent (device/increased time)              PT Long Term Goals - 08/15/14 1009    PT LONG TERM GOAL #1   Title Demonstrate and verbalize proper prosthetic care to enable safe utilization of the prosthesis. 08/23/2014   Status On-going   PT LONG TERM GOAL #2   Title Tolerate wear of prosthesis for >90% of awake hours without change in skin integrity nor undue tenderness. 08/23/2014   Status On-going   PT LONG TERM GOAL #3   Title Ambulate 500 feet with LRAD and prosthesis modified independent. 08/23/2014   Status On-going   PT LONG TERM GOAL #4   Title Ambulate 75 feet around household furniture carring cup with single point cane and prosthesis modified independent. 08/23/2014   Status On-going   PT LONG TERM GOAL #5   Title Negotiate ramp, curb, stairs with LRAD and prosthesis modified independent. 08/23/2014   Status On-going   PT LONG TERM GOAL #6   Title Perform standing activities for >20 minutes with prosthesis with no pain. 08/23/2014   PT LONG TERM GOAL #7   Title Berg Balance Test score with prosthesis >/= 45/56. 08/23/2014   Status On-going   PT LONG TERM GOAL #8   Title Self-report via FOTO improvement >10 points in Functional status. 08/23/2014   Status On-going   PT LONG TERM GOAL  #9   TITLE Demonstrate work simulated tasks wiht prosthesis safely. 08/23/2014   Status On-going          Plan - 08/15/14 1006    Clinical Impression Statement Pt able to tolerate a little more activity today before needed rest due to pain. Pain resolved with rest each time down to 1-2/10 level. Pt reports she did see the prosthetist after last  session for additional changes which she feels has helped some.                                                 Pt will benefit from skilled  therapeutic intervention in order to improve on the following deficits Abnormal gait;Decreased coordination;Difficulty walking;Decreased endurance;Decreased activity tolerance;Decreased balance;Decreased knowledge of use of DME;Decreased mobility;Decreased strength   Rehab Potential Good   PT Frequency 2x / week   PT Treatment/Interventions Therapeutic activities;Neuromuscular re-education;Gait training;Patient/family education;Therapeutic exercise;ADLs/Self Care Home Management;Balance training;Energy conservation;Other (comment)  prosthetic training   PT Next Visit Plan Continue to work on gait with  cane and balance/strenghening activities as pain allows. Assess LTG's next week due to end of care plan.   PT Home Exercise Plan continue with current Sink HEP for balance and proprioception   Consulted and Agree with Plan of Care Patient            Problem List Patient Active Problem List   Diagnosis Date Noted  . Rash 07/06/2014  . S/P BKA (below knee amputation) 03/20/2014  . 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 Critical lower limb ischemia 01/09/2014  . Right foot ulcer 11/07/2013  . Toe pain 09/06/2013  . Pre-ulcerative corn or callous 09/06/2013  . Lower extremity pain 09/06/2013  . End stage renal disease 04/06/2012  . Left low back pain 12/08/2011  . Hip bursitis, left 12/08/2011  . Left leg pain 12/08/2011  . Abdominal pain, bilateral lower quadrant 07/08/2011  . Preventative health care 12/08/2010  . Allergic rhinitis, cause unspecified 12/08/2010  . Diabetes 05/21/2009  . SECONDARY HYPERPARATHYROIDISM 05/21/2009  . NUMBNESS 05/21/2009  . BACK PAIN 05/02/2009  . ANEMIA-IRON DEFICIENCY 01/25/2008  . Essential hypertension 01/25/2008  . CERVICAL RADICULOPATHY, LEFT 01/25/2008  . Hyperlipidemia 03/30/2007    Willow Ora 08/15/2014, 10:13 AM  Willow Ora, PTA, [facility] [address] [place], [place]  [address] [phone] 08/15/2014, 3:26 PM

## 2014-08-20 ENCOUNTER — Ambulatory Visit: Payer: BC Managed Care – PPO | Admitting: Physical Therapy

## 2014-08-20 ENCOUNTER — Encounter: Payer: Self-pay | Admitting: Physical Therapy

## 2014-08-20 DIAGNOSIS — R6889 Other general symptoms and signs: Secondary | ICD-10-CM

## 2014-08-20 DIAGNOSIS — Z5189 Encounter for other specified aftercare: Secondary | ICD-10-CM | POA: Diagnosis not present

## 2014-08-20 DIAGNOSIS — Z89511 Acquired absence of right leg below knee: Secondary | ICD-10-CM

## 2014-08-20 DIAGNOSIS — R269 Unspecified abnormalities of gait and mobility: Secondary | ICD-10-CM

## 2014-08-20 NOTE — Therapy (Signed)
[facility] [address] [place], [place], [address] Phone: [phone]   Fax:  [phone]  Physical Therapy Treatment  Patient Details  Name: Carolyn Valenzuela MRN: [id number] Date of Birth: [birth year]  Encounter Date: 08/20/2014      PT End of Session - 08/20/14 0930    Visit Number 13   Number of Visits 17   Date for PT Re-Evaluation 08/23/14   PT Start Time 0930   PT Stop Time 1015   PT Time Calculation (min) 45 min   Equipment Utilized During Treatment Gait belt   Activity Tolerance Patient limited by pain   Behavior During Therapy [facility] for tasks assessed/performed      Past Medical History  Diagnosis Date  . DIABETES MELLITUS, UNCONTROLLED 05/21/2009  . HYPERLIPIDEMIA 03/30/2007  . ANEMIA-IRON DEFICIENCY 01/25/2008  . HYPERTENSION 01/25/2008  . SINUSITIS- ACUTE-NOS 01/25/2008  . SECONDARY HYPERPARATHYROIDISM 05/21/2009  . RENAL INSUFFICIENCY 02/01/2008  . CERVICAL RADICULOPATHY, LEFT 01/25/2008  . BACK PAIN 05/02/2009  . NUMBNESS 05/21/2009  . Allergic rhinitis, cause unspecified 12/08/2010  . Foot ulcer     right lateral malleolus  . Critical lower limb ischemia     Past Surgical History  Procedure Laterality Date  . Av fistula placement Left   . Eye surgery Bilateral     cataracts removed, left eye still has some oil in it.  . Colonoscopy    . Amputation Right 03/15/2014    Procedure: RIGHT  LEG  BELOW KNEE AMPUTATION ;  Surgeon: [name], MD;  Location: [facility];  Service: Orthopedics;  Laterality: Right;    There were no vitals taken for this visit.  Visit Diagnosis:  Activity intolerance  Abnormality of gait  Status post below knee amputation of right lower extremity      Subjective Assessment - 08/20/14 0934    Symptoms wore prosthesis all day yesterday for church without isses. Bone is feeling better.    Currently in Pain? No/denies                    [facility] Adult PT  Treatment/Exercise - 08/20/14 0930    Transfers   Sit to Stand 5: Supervision;Without upper extremity assist;From chair/3-in-1   Stand to Sit 5: Supervision;Without upper extremity assist;To chair/3-in-1   Ambulation/Gait   Ambulation/Gait Yes   Ambulation/Gait Assistance 4: Min guard   Ambulation/Gait Assistance Details cues on posture & wt shift   Ambulation Distance (Feet) 350 Feet  20' X 2 without device, 350' with cane    Assistive device Straight cane;None  BKA prosthesis   Gait Pattern Step-through pattern;Decreased stance time - right   Gait velocity WFL   Stairs Yes   Stairs Assistance 5: Supervision   Stairs Assistance Details (indicate cue type and reason) attempted alternate pattern but pain in tibia returned   Stair Management Technique Step to pattern;Two rails   Number of Stairs 4   Ramp 4: Min assist  single point cane & prosthesis   Ramp Details (indicate cue type and reason) cues on wt shift, step length & posture   Curb 4: Min assist  single point cane & BKA prosthesis   Curb Details (indicate cue type and reason) verbal cues on sequence /technique   Dynamic Standing Balance   Dynamic Standing - Balance Support During functional activity   Dynamic Standing - Level of Assistance 4: Min assist  tactile cues   Dynamic Standing - Balance Activities Reaching for objects;Rocker board  reaching with red therband forward, upward & row   Dynamic Standing - Comments rocker board ant/post & lateral stabilization & wt shifts.   Knee/Hip Exercises: Aerobic   Stationary Bike Nustep x4 extremeties Level 5 x 5 minutes with goal >/= 55 steps per minute for strengthening and activity tolerance           Prosthetics   Current prosthetic wear tolerance (days/week)  7 days /wk   Current prosthetic wear tolerance (#hours/day)  all awake hours yesterday, but 7-10 hrs/day other days.   Residual limb condition  no pitting edema nor tenderness at bone.   Education Provided Proper wear  schedule/adjustment  alignment of pin with donning   Person(s) Educated Patient   Education Method Explanation   Education Method Verbalized understanding;Returned demonstration   Donning Prosthesis Supervision   Doffing Prosthesis Modified independent (device/increased time)                  PT Short Term Goals - 08/08/14 1637    PT SHORT TERM GOAL #1   Title Verbalize proper prosthetic care except cues to adjust the number of ply socks. 07/27/2014   Status Achieved   PT SHORT TERM GOAL #2   Title Tolerate wear of prosthesis for >8 hours without change in skin integrity nor undue tenderness. 07/27/2014   Status On-going   PT SHORT TERM GOAL #3   Title Ambulate 300 feet with rolling walker and prosthesis modified independent. 07/27/2014   Status Achieved   PT SHORT TERM GOAL #4   Title Negotiate ramp, curb, stairs (1 rail) with RW and prosthesis with supervision. 07/27/2014   Status Achieved   PT SHORT TERM GOAL #5   Title Perform standing activities for >10 minutes with no pain. 07/27/2014   Status Achieved   PT SHORT TERM GOAL #6   Title Increase BERG balance test score with prosthesis to >/= 35/56. 07/27/2014   Status Achieved   PT SHORT TERM GOAL #7   Title Verbalize understanding of fall prevention strategies. 07/27/2014   Status Achieved           PT Long Term Goals - 08/20/14 0930    PT LONG TERM GOAL #1   Title Demonstrate and verbalize proper prosthetic care to enable safe utilization of the prosthesis. 08/23/2014   Status On-going   PT LONG TERM GOAL #2   Title Tolerate wear of prosthesis for >90% of awake hours without change in skin integrity nor undue tenderness. 08/23/2014   Status On-going   PT LONG TERM GOAL #3   Title Ambulate 500 feet with LRAD and prosthesis modified independent. 08/23/2014   Status On-going   PT LONG TERM GOAL #4   Title Ambulate 75 feet around household furniture carring cup with single point cane and prosthesis modified  independent. 08/23/2014   Status On-going   PT LONG TERM GOAL #5   Title Negotiate ramp, curb, stairs with LRAD and prosthesis modified independent. 08/23/2014   Status On-going   PT LONG TERM GOAL #6   Title Perform standing activities for >20 minutes with prosthesis with no pain. 08/23/2014   PT LONG TERM GOAL #7   Title Berg Balance Test score with prosthesis >/= 45/56. 08/23/2014   Status On-going   PT LONG TERM GOAL #8   Title Self-report via FOTO improvement >10 points in Functional status. 08/23/2014   Status On-going   PT LONG TERM GOAL  #9   TITLE Demonstrate work simulated tasks wiht prosthesis safely. 08/23/2014  Status On-going               Plan - 08/20/14 0930    Clinical Impression Statement Patient had less tenderness with prosthesis use until attempted stairs with 2 rails reciprocally. Tenderness had returned to no issues by end of session.   Pt will benefit from skilled therapeutic intervention in order to improve on the following deficits Abnormal gait;Decreased coordination;Difficulty walking;Decreased endurance;Decreased activity tolerance;Decreased balance;Decreased knowledge of use of DME;Decreased mobility;Decreased strength   Rehab Potential Good   PT Frequency 2x / week   PT Treatment/Interventions Therapeutic activities;Neuromuscular re-education;Gait training;Patient/family education;Therapeutic exercise;ADLs/Self Care Home Management;Balance training;Energy conservation;Other (comment)  prosthetic training   PT Next Visit Plan Assess LTGs & renew   Consulted and Agree with Plan of Care Patient        Problem List Patient Active Problem List   Diagnosis Date Noted  . Rash 07/06/2014  . S/P BKA (below knee amputation) 03/20/2014  . 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 Critical lower limb ischemia 01/09/2014  . Right foot ulcer 11/07/2013  . Toe pain 09/06/2013  . Pre-ulcerative corn or callous 09/06/2013  . Lower  extremity pain 09/06/2013  . End stage renal disease 04/06/2012  . Left low back pain 12/08/2011  . Hip bursitis, left 12/08/2011  . Left leg pain 12/08/2011  . Abdominal pain, bilateral lower quadrant 07/08/2011  . Preventative health care 12/08/2010  . Allergic rhinitis, cause unspecified 12/08/2010  . Diabetes 05/21/2009  . SECONDARY HYPERPARATHYROIDISM 05/21/2009  . NUMBNESS 05/21/2009  . BACK PAIN 05/02/2009  . ANEMIA-IRON DEFICIENCY 01/25/2008  . Essential hypertension 01/25/2008  . CERVICAL RADICULOPATHY, LEFT 01/25/2008  . Hyperlipidemia 03/30/2007   Carolyn Valenzuela, PT, DPT PT Specializing in [place] 08/20/2014 3:29 PM Phone:  [phone]  Fax:  [phone] [facility] [address] [place], [place] [address]   Carolyn Valenzuela 08/20/2014, 3:29 PM  [facility] [address] [place], [place], [address] Phone: [phone]   Fax:  [phone]

## 2014-08-22 ENCOUNTER — Encounter: Payer: Self-pay | Admitting: Physical Therapy

## 2014-08-22 ENCOUNTER — Ambulatory Visit: Payer: BC Managed Care – PPO | Admitting: Physical Therapy

## 2014-08-22 DIAGNOSIS — R5381 Other malaise: Secondary | ICD-10-CM

## 2014-08-22 DIAGNOSIS — R6889 Other general symptoms and signs: Secondary | ICD-10-CM

## 2014-08-22 DIAGNOSIS — R269 Unspecified abnormalities of gait and mobility: Secondary | ICD-10-CM

## 2014-08-22 DIAGNOSIS — Z89511 Acquired absence of right leg below knee: Secondary | ICD-10-CM

## 2014-08-22 DIAGNOSIS — Z5189 Encounter for other specified aftercare: Secondary | ICD-10-CM | POA: Diagnosis not present

## 2014-08-22 NOTE — Therapy (Signed)
[facility] [address] [place], [place], [address] Phone: [phone]   Fax:  [phone]  Physical Therapy Treatment  Patient Details  Name: Carolyn Valenzuela MRN: [id number] Date of Birth: 1951/08/22  Encounter Date: 08/22/2014      PT End of Session - 08/22/14 0935    Visit Number 14   Number of Visits 17   Date for PT Re-Evaluation 08/23/14   PT Start Time 0933   PT Stop Time 1015   PT Time Calculation (min) 42 min   Equipment Utilized During Treatment Gait belt   Activity Tolerance Patient limited by pain   Behavior During Therapy [facility] for tasks assessed/performed      Past Medical History  Diagnosis Date  . DIABETES MELLITUS, UNCONTROLLED 05/21/2009  . HYPERLIPIDEMIA 03/30/2007  . ANEMIA-IRON DEFICIENCY 01/25/2008  . HYPERTENSION 01/25/2008  . SINUSITIS- ACUTE-NOS 01/25/2008  . SECONDARY HYPERPARATHYROIDISM 05/21/2009  . RENAL INSUFFICIENCY 02/01/2008  . CERVICAL RADICULOPATHY, LEFT 01/25/2008  . BACK PAIN 05/02/2009  . NUMBNESS 05/21/2009  . Allergic rhinitis, cause unspecified 12/08/2010  . Foot ulcer     right lateral malleolus  . Critical lower limb ischemia     Past Surgical History  Procedure Laterality Date  . Av fistula placement Left   . Eye surgery Bilateral     cataracts removed, left eye still has some oil in it.  . Colonoscopy    . Amputation Right 03/15/2014    Procedure: RIGHT  LEG  BELOW KNEE AMPUTATION ;  Surgeon: [name], MD;  Location: [facility];  Service: Orthopedics;  Laterality: Right;    There were no vitals taken for this visit.  Visit Diagnosis:  Activity intolerance  Debility  Abnormality of gait  Status post below knee amputation of right lower extremity      Subjective Assessment - 08/22/14 0934    Symptoms No new complaints. Reports no pain today or yesterday. Reports no falls as well.   Currently in Pain? No/denies   Pain Score 0-No pain           [facility] Adult  PT Treatment/Exercise - 08/22/14 0940    Transfers   Sit to Stand 5: Supervision;6: Modified independent (Device/Increase time);With upper extremity assist;From chair/3-in-1   Stand to Sit 5: Supervision;6: Modified independent (Device/Increase time);With upper extremity assist;To chair/3-in-1   Ambulation/Gait   Ambulation/Gait Yes   Ambulation/Gait Assistance 4: Min guard;6: Modified independent (Device/Increase time)   Ambulation/Gait Assistance Details cues on posture with both devices, cues on sequence and cane placement with use of cane. On last 2 gait trials used cane while carring cup of water around obstacles/furniture with min assist.                     Ambulation Distance (Feet) 427 Feet  x1 with cane, >/= 500 feet with rw, 66f, 1026fwith cane   Assistive device Rolling walker;Straight cane   Gait Pattern Step-through pattern;Decreased stance time - right   Gait velocity WFL   Stairs Assistance 5: Supervision   Stairs Assistance Details (indicate cue type and reason) reciprocal pattern to ascend and step to pattern to descend   Stair Management Technique Alternating pattern;Step to pattern;Forwards;Two rails   Number of Stairs 4   Ramp 6: Modified independent (Device)   Ramp Details (indicate cue type and reason) with rw/prosthesis   Curb 6: Modified independent (Device/increase time)   Curb Details (indicate cue type and reason) with rw/prosthesis   BeMerrilee Jansky  Balance Test   Sit to Stand Able to stand without using hands and stabilize independently   Standing Unsupported Able to stand safely 2 minutes   Sitting with Back Unsupported but Feet Supported on Floor or Stool Able to sit safely and securely 2 minutes   Stand to Sit Sits safely with minimal use of hands   Transfers Able to transfer safely, minor use of hands   Standing Unsupported with Eyes Closed Able to stand 10 seconds safely   Standing Ubsupported with Feet Together Able to place feet together independently and stand  1 minute safely   From Standing, Reach Forward with Outstretched Arm Can reach confidently >25 cm (10")   From Standing Position, Pick up Object from Floor Able to pick up shoe safely and easily   From Standing Position, Turn to Look Behind Over each Shoulder Looks behind from both sides and weight shifts well   Turn 360 Degrees Able to turn 360 degrees safely but slowly  > 5 seconds each way   Standing Unsupported, Alternately Place Feet on Step/Stool Able to complete 4 steps without aid or supervision   Standing Unsupported, One Foot in Front Able to take small step independently and hold 30 seconds   Standing on One Leg Able to lift leg independently and hold 5-10 seconds   Total Score 49   Prosthetics   Current prosthetic wear tolerance (days/week)  7 days /wk   Current prosthetic wear tolerance (#hours/day)  all awake hours, takes off to dry 2x a day   Residual limb condition  no pitting edema nor tenderness at bone.   Education Provided Proper wear schedule/adjustment;Correct ply sock adjustment   Person(s) Educated Patient   Education Method Explanation;Demonstration;Verbal cues   Education Method Verbalized understanding;Needs further instruction   Donning Prosthesis Supervision   Doffing Prosthesis Modified independent (device/increased time)            PT Short Term Goals - 08/08/14 1637    PT SHORT TERM GOAL #1   Title Verbalize proper prosthetic care except cues to adjust the number of ply socks. 07/27/2014   Status Achieved   PT SHORT TERM GOAL #2   Title Tolerate wear of prosthesis for >8 hours without change in skin integrity nor undue tenderness. 07/27/2014   Status On-going   PT SHORT TERM GOAL #3   Title Ambulate 300 feet with rolling walker and prosthesis modified independent. 07/27/2014   Status Achieved   PT SHORT TERM GOAL #4   Title Negotiate ramp, curb, stairs (1 rail) with RW and prosthesis with supervision. 07/27/2014   Status Achieved   PT SHORT TERM  GOAL #5   Title Perform standing activities for >10 minutes with no pain. 07/27/2014   Status Achieved   PT SHORT TERM GOAL #6   Title Increase BERG balance test score with prosthesis to >/= 35/56. 07/27/2014   Status Achieved   PT SHORT TERM GOAL #7   Title Verbalize understanding of fall prevention strategies. 07/27/2014   Status Achieved           PT Long Term Goals - 08/22/14 1420    PT LONG TERM GOAL #1   Title Demonstrate and verbalize proper prosthetic care to enable safe utilization of the prosthesis. (PT reset Target Date to 09/21/14 at renewal)   Baseline 08/22/2014- pt still needs cues on sock management at times.   Time 4   Period Weeks   Status On-going   PT LONG TERM GOAL #2  Title Tolerate wear of prosthesis for >90% of awake hours without change in skin integrity nor undue tenderness. (PT reset Target Date to 09/21/14 at renewal)   Baseline 08/22/2014- Pt still getting soreness/pain with at end of limb, no skin issues.   Time 4   Period Weeks   Status On-going   PT LONG TERM GOAL #3   Title Ambulate 500 feet with LRAD and prosthesis modified independent. (PT reset Target Date to 09/21/14 at renewal)   Baseline 08/22/2014- met with RW/prosthesis, not with cane.   Time 4   Period Weeks   Status On-going   PT LONG TERM GOAL #4   Title Ambulate 75 feet around household furniture carring cup with single point cane and prosthesis modified independent. (PT reset Target Date to 09/21/14 at renewal)   Baseline 08/22/2014- needs min guard assist, however can ambulate up to 100 feet around furniture with cup of water using single point cane.   Time 4   Period Weeks   Status On-going   PT LONG TERM GOAL #5   Title Negotiate ramp, curb, stairs with LRAD and prosthesis modified independent. (PT reset Target Date to 09/21/14 at renewal)   Baseline 08/22/2014- met using RW/prosthesis and 2 rails on stairs.   Time 4   Period Weeks   Status Achieved   PT LONG TERM GOAL #6    Title Perform standing activities for >20 minutes with prosthesis with no pain. (PT reset Target Date to 09/21/14 at renewal)   Baseline 08/22/2014- reports increased distal tibia pain with increased activity/weight bearing with prosthesis.   Status On-going   PT LONG TERM GOAL #7   Title Berg Balance Test score with prosthesis >/= 45/56.    Baseline 08/22/2014- met with score of 49/56.   Status Achieved   PT LONG TERM GOAL #8   Title Self-report via FOTO improvement >10 points in Functional status. 08/23/2014   Baseline 08/22/2014- not assessed as pt is being renewed by PT   Status On-going   PT LONG TERM GOAL  #9   TITLE Demonstrate work simulated tasks wiht prosthesis safely. (PT reset Target Date to 09/21/14 at renewal)   Baseline 08/22/2014- not addressed as yet.   Status On-going            Plan - 08/22/14 0935    Clinical Impression Statement Most LTG's met today with PT planning to renew patient. Pt still gets some soreness/pain on end of limb (tibia) with increased activity/weight bearing that decreases with rest.   Pt will benefit from skilled therapeutic intervention in order to improve on the following deficits Abnormal gait;Decreased coordination;Difficulty walking;Decreased endurance;Decreased activity tolerance;Decreased balance;Decreased knowledge of use of DME;Decreased mobility;Decreased strength   Rehab Potential Good   PT Frequency 2x / week   PT Duration 4 weeks   PT Treatment/Interventions Therapeutic activities;Neuromuscular re-education;Gait training;Patient/family education;Therapeutic exercise;ADLs/Self Care Home Management;Balance training;Energy conservation;Other (comment)  prosthetic training   PT Next Visit Plan PT  to renew, continue per that plan of care toward the goals.   PT Home Exercise Plan continue with current S[facility] HEP for balance and proprioception   Recommended Other Services Jamey Reas, PT, DPT : recertification for 2xwk for 4 addtional  weeks on 08/22/14    Consulted and Agree with Plan of Care Patient        Problem List Patient Active Problem List   Diagnosis Date Noted  . Rash 07/06/2014  . S/P BKA (below knee amputation) 03/20/2014  . 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 Critical lower limb ischemia 01/09/2014  . Right foot ulcer 11/07/2013  . Toe pain 09/06/2013  . Pre-ulcerative corn or callous 09/06/2013  . Lower extremity pain 09/06/2013  . End stage renal disease 04/06/2012  . Left low back pain 12/08/2011  . Hip bursitis, left 12/08/2011  . Left leg pain 12/08/2011  . Abdominal pain, bilateral lower quadrant 07/08/2011  . Preventative health care 12/08/2010  . Allergic rhinitis, cause unspecified 12/08/2010  . Diabetes 05/21/2009  . SECONDARY HYPERPARATHYROIDISM 05/21/2009  . NUMBNESS 05/21/2009  . BACK PAIN 05/02/2009  . ANEMIA-IRON DEFICIENCY 01/25/2008  . Essential hypertension 01/25/2008  . CERVICAL RADICULOPATHY, LEFT 01/25/2008  . Hyperlipidemia 03/30/2007    WALDRON,ROBIN 08/22/2014, 3:02 PM  Willow Ora, PTA, [facility] [address] [place], [place] [address] [phone] 08/22/2014, 3:02 PM   Treatment on 08/22/14 provided by Willow Ora, PTA.  Recertification / renewal done by primary PT: Jamey Reas, PT, DPT PT Specializing in [place] 08/22/2014 3:04 PM Phone:  [phone]  Fax:  [phone] [facility] [address] [place], [place] [address]

## 2014-08-27 ENCOUNTER — Ambulatory Visit: Payer: BC Managed Care – PPO | Admitting: Physical Medicine & Rehabilitation

## 2014-08-27 ENCOUNTER — Encounter: Payer: Self-pay | Admitting: Physical Medicine & Rehabilitation

## 2014-08-27 ENCOUNTER — Encounter
Payer: BC Managed Care – PPO | Attending: Physical Medicine & Rehabilitation | Admitting: Physical Medicine & Rehabilitation

## 2014-08-27 VITALS — BP 205/78 | HR 85 | Resp 14

## 2014-08-27 DIAGNOSIS — N2581 Secondary hyperparathyroidism of renal origin: Secondary | ICD-10-CM | POA: Insufficient documentation

## 2014-08-27 DIAGNOSIS — N289 Disorder of kidney and ureter, unspecified: Secondary | ICD-10-CM | POA: Diagnosis not present

## 2014-08-27 DIAGNOSIS — R600 Localized edema: Secondary | ICD-10-CM | POA: Diagnosis not present

## 2014-08-27 DIAGNOSIS — Z09 Encounter for follow-up examination after completed treatment for conditions other than malignant neoplasm: Secondary | ICD-10-CM | POA: Insufficient documentation

## 2014-08-27 DIAGNOSIS — M5412 Radiculopathy, cervical region: Secondary | ICD-10-CM | POA: Diagnosis not present

## 2014-08-27 DIAGNOSIS — R200 Anesthesia of skin: Secondary | ICD-10-CM | POA: Diagnosis not present

## 2014-08-27 DIAGNOSIS — I998 Other disorder of circulatory system: Secondary | ICD-10-CM | POA: Insufficient documentation

## 2014-08-27 DIAGNOSIS — I10 Essential (primary) hypertension: Secondary | ICD-10-CM | POA: Diagnosis not present

## 2014-08-27 DIAGNOSIS — E1165 Type 2 diabetes mellitus with hyperglycemia: Secondary | ICD-10-CM | POA: Diagnosis not present

## 2014-08-27 DIAGNOSIS — E1140 Type 2 diabetes mellitus with diabetic neuropathy, unspecified: Secondary | ICD-10-CM | POA: Diagnosis not present

## 2014-08-27 DIAGNOSIS — M549 Dorsalgia, unspecified: Secondary | ICD-10-CM | POA: Diagnosis not present

## 2014-08-27 DIAGNOSIS — E785 Hyperlipidemia, unspecified: Secondary | ICD-10-CM | POA: Diagnosis not present

## 2014-08-27 DIAGNOSIS — D509 Iron deficiency anemia, unspecified: Secondary | ICD-10-CM | POA: Insufficient documentation

## 2014-08-27 DIAGNOSIS — Z89511 Acquired absence of right leg below knee: Secondary | ICD-10-CM | POA: Insufficient documentation

## 2014-08-27 DIAGNOSIS — I872 Venous insufficiency (chronic) (peripheral): Secondary | ICD-10-CM | POA: Insufficient documentation

## 2014-08-27 NOTE — Patient Instructions (Signed)
FOR SWELLING:  KNEE HIGH COMPRESSION STOCKING HEAT ELEVATION MASSAGE

## 2014-08-27 NOTE — Progress Notes (Signed)
Subjective:    Patient ID: Carolyn Valenzuela, female    DOB: [birth year], 63 y.o.   MRN: [id number]  HPI   Carolyn Valenzuela is back regarding her right BKA. She has been doing well with her gait. therpay has been extended to achieve gait with a straight cane. She feels that her leg is fiting well. She denies pain in her right leg. She is having more problems with her left leg and associated swelling. The knee and ankle does not hurt.   BP is elevated today.   Pain Inventory Average Pain 7 Pain Right Now 7 My pain is burning and aching  In the last 24 hours, has pain interfered with the following? General activity 0 Relation with others 0 Enjoyment of life 0 What TIME of day is your pain at its worst? all Sleep (in general) NA  Pain is worse with: some activites Pain improves with: n/a Relief from Meds: n/a  Mobility walk with assistance use a walker ability to climb steps?  yes do you drive?  no  Function disabled: date disabled .  Neuro/Psych No problems in this area  Prior Studies Any changes since last visit?  no  Physicians involved in your care Any changes since last visit?  no   Family History  Problem Relation Age of Onset  . Cancer Mother     Pancreatic   History   Social History  . Marital Status: Single    Spouse Name: N/A    Number of Children: N/A  . Years of Education: N/A   Occupational History  . [employer] History Main Topics  . Smoking status: Never Smoker   . Smokeless tobacco: Never Used  . Alcohol Use: No  . Drug Use: No  . Sexual Activity: None   Other Topics Concern  . None   Social History Narrative   Past Surgical History  Procedure Laterality Date  . Av fistula placement Left   . Eye surgery Bilateral     cataracts removed, left eye still has some oil in it.  . Colonoscopy    . Amputation Right 03/15/2014    Procedure: RIGHT  LEG  BELOW KNEE AMPUTATION ;  Surgeon: [name], MD;   Location: [facility];  Service: Orthopedics;  Laterality: Right;   Past Medical History  Diagnosis Date  . DIABETES MELLITUS, UNCONTROLLED 05/21/2009  . HYPERLIPIDEMIA 03/30/2007  . ANEMIA-IRON DEFICIENCY 01/25/2008  . HYPERTENSION 01/25/2008  . SINUSITIS- ACUTE-NOS 01/25/2008  . SECONDARY HYPERPARATHYROIDISM 05/21/2009  . RENAL INSUFFICIENCY 02/01/2008  . CERVICAL RADICULOPATHY, LEFT 01/25/2008  . BACK PAIN 05/02/2009  . NUMBNESS 05/21/2009  . Allergic rhinitis, cause unspecified 12/08/2010  . Foot ulcer     right lateral malleolus  . Critical lower limb ischemia    BP 205/78 mmHg  Pulse 85  Resp 14  SpO2 99%  Opioid Risk Score:   Fall Risk Score: Low Fall Risk (0-5 points) (educated and given handout)  Review of Systems  All other systems reviewed and are negative.      Objective:   Physical Exam  Constitutional: She is oriented to person, place, and time.  HENT: oral mucosa pink missing teeth  Head: Normocephalic.  Eyes: EOM are normal.  Neck: Normal range of motion. Neck supple. No thyromegaly present.  Cardiovascular: Normal rate and regular rhythm. No murmur or rub Ext: LLE: with 1+ pitting edema. Tender to palpation along calf and ?Homan's. Discoloration along the anterior tibia--appears to  be chronic vascular changes. Respiratory: Effort normal and breath sounds normal. No respiratory distress.  GI: Soft. Bowel sounds are normal. She exhibits no distension. Non-tender Musculoskeletal:  Right BK appropriately shaped.there is a callus along the distal tibia---no breakdown or redness. She clicks down 7-8 x's into prosthesis once standing Neurological: She is alert and oriented to person, place, and time.  Strength 5/5 in all 4 limbs.  Skin:  Psychiatric: She has a normal mood and affect. Her behavior is normal. Judgment and thought content normal   Assessment/Plan:  1. Functional deficits secondary to right BKA secondary to gangrenous changes 03/16/2014  -continue  shrinker--probably needs to wear an extra ply or two given callus  -[facility]  -gait training with PT to advance to cane  2. LLE edema with pain.? phlebitis  -ordered doppler to assess  For potential thrombus given pain and continued edema  -Knee high TED  -also discussed TEDS, elevation, massage, heat  -follow up with PCP regarding BP/medical also 3. Diabetes mellitus with peripheral neuropathy.  4.. Chronic renal insufficiency.    Follow up with me in about 3 months.  Thirty minutes of face to face patient care time were spent during this visit. All questions were encouraged and answered.

## 2014-08-29 ENCOUNTER — Ambulatory Visit: Payer: BC Managed Care – PPO

## 2014-08-29 DIAGNOSIS — R5381 Other malaise: Secondary | ICD-10-CM

## 2014-08-29 DIAGNOSIS — R269 Unspecified abnormalities of gait and mobility: Secondary | ICD-10-CM

## 2014-08-29 DIAGNOSIS — R6889 Other general symptoms and signs: Secondary | ICD-10-CM

## 2014-08-29 DIAGNOSIS — Z89511 Acquired absence of right leg below knee: Secondary | ICD-10-CM

## 2014-08-29 DIAGNOSIS — Z5189 Encounter for other specified aftercare: Secondary | ICD-10-CM | POA: Diagnosis not present

## 2014-08-29 NOTE — Therapy (Signed)
[facility] [address] [place], [place], [address] Phone: [phone]   Fax:  [phone]  Physical Therapy Treatment  Patient Details  Name: Carolyn Valenzuela MRN: [id number] Date of Birth: 11-Jun-1951  Encounter Date: 08/29/2014      PT End of Session - 08/29/14 1303    Visit Number 15   Number of Visits 17   Date for PT Re-Evaluation --  re-cert until 12/08/71   PT Start Time 1100   PT Stop Time 1142   PT Time Calculation (min) 42 min   Equipment Utilized During Treatment Gait belt   Activity Tolerance Patient tolerated treatment well;Patient limited by fatigue   Behavior During Therapy [facility] for tasks assessed/performed      Past Medical History  Diagnosis Date  . DIABETES MELLITUS, UNCONTROLLED 05/21/2009  . HYPERLIPIDEMIA 03/30/2007  . ANEMIA-IRON DEFICIENCY 01/25/2008  . HYPERTENSION 01/25/2008  . SINUSITIS- ACUTE-NOS 01/25/2008  . SECONDARY HYPERPARATHYROIDISM 05/21/2009  . RENAL INSUFFICIENCY 02/01/2008  . CERVICAL RADICULOPATHY, LEFT 01/25/2008  . BACK PAIN 05/02/2009  . NUMBNESS 05/21/2009  . Allergic rhinitis, cause unspecified 12/08/2010  . Foot ulcer     right lateral malleolus  . Critical lower limb ischemia     Past Surgical History  Procedure Laterality Date  . Av fistula placement Left   . Eye surgery Bilateral     cataracts removed, left eye still has some oil in it.  . Colonoscopy    . Amputation Right 03/15/2014    Procedure: RIGHT  LEG  BELOW KNEE AMPUTATION ;  Surgeon: [name], MD;  Location: [facility];  Service: Orthopedics;  Laterality: Right;    There were no vitals taken for this visit.  Visit Diagnosis:  Status post below knee amputation of right lower extremity  Abnormality of gait  Debility  Activity intolerance      Subjective Assessment - 08/29/14 1103    Symptoms Pt denied falls or changes since last visit. Pt reported she is wearing prosthesis for approx. 10 hours/day (pt  dons prosthesis for approx. 5 hours, before she takes she it off for about one minute to wipe down any sweat on residual limb and then dons prosthesis for another 5 hours).  Pt reported her MD prescribed compression hose for L LE for edema.   Limitations Walking;Standing   Patient Stated Goals To use prosthesis to walk in community & return to work   Currently in Pain? No/denies                    [facility] Adult PT Treatment/Exercise - 08/29/14 1107    Transfers   Transfers Sit to Stand;Stand to Sit   Sit to Stand 5: Supervision;With upper extremity assist   Sit to Stand Details (indicate cue type and reason) supervision to ensure safety, no cues needed. pt used one UE support.   Stand to Sit 5: Supervision;With upper extremity assist   Stand to Sit Details supervision to ensure safety. pt demonstrated good control and no cues needed.   Ambulation/Gait   Ambulation/Gait Yes   Ambulation/Gait Assistance 4: Min guard   Ambulation/Gait Assistance Details R prosthesis donned during session: Pt ambulated over even terrain with SPC and RW, while performing head turns. Pt required seated rest breaks after each bout of ambulation due to SOB and fatigue. SaO2 on room air remained >95% during session. Pt noted to decrease ambulation speed with fatigue. VC's for sequencing (cane placement), as pt placed SPC in front  of R LE.   Ambulation Distance (Feet) --  22' with RW; 230'x2,115', 75'x2 with SPC   Assistive device Rolling walker;Straight cane   Gait Pattern Step-through pattern;Decreased stance time - right   Balance   Balance Assessed Yes   Static Standing Balance   Static Standing - Balance Support No upper extremity supported   Static Standing - Level of Assistance 5: Stand by assistance;Other (comment)  min guard   Static Standing - Comment/# of Minutes Performed in corner with chair in front of pt for safety: feet apart/feet together with eyes open/closed with and without head turns,  feet together with eyes closed with head turns, modified tandem stance. All performed with B LEs (R prosthesis donned during session), with 1-2 sets with 30 second holds. VC's for technique.   Dynamic Standing Balance   Dynamic Standing - Balance Support No upper extremity supported   Dynamic Standing - Level of Assistance Other (comment)  min guard   Dynamic Standing - Balance Activities Other (comment)   Dynamic Standing - Comments Dynamic balance at counter with 1-2 UE support and min guard for safety, 4x7'/activity: marching with head turns, sidestepping, and forward ambulation. VC's to improve stride length and weight shifting.                  PT Short Term Goals - 08/08/14 1637    PT SHORT TERM GOAL #1   Title Verbalize proper prosthetic care except cues to adjust the number of ply socks. 07/27/2014   Status Achieved   PT SHORT TERM GOAL #2   Title Tolerate wear of prosthesis for >8 hours without change in skin integrity nor undue tenderness. 07/27/2014   Status On-going   PT SHORT TERM GOAL #3   Title Ambulate 300 feet with rolling walker and prosthesis modified independent. 07/27/2014   Status Achieved   PT SHORT TERM GOAL #4   Title Negotiate ramp, curb, stairs (1 rail) with RW and prosthesis with supervision. 07/27/2014   Status Achieved   PT SHORT TERM GOAL #5   Title Perform standing activities for >10 minutes with no pain. 07/27/2014   Status Achieved   PT SHORT TERM GOAL #6   Title Increase BERG balance test score with prosthesis to >/= 35/56. 07/27/2014   Status Achieved   PT SHORT TERM GOAL #7   Title Verbalize understanding of fall prevention strategies. 07/27/2014   Status Achieved           PT Long Term Goals - 08/22/14 1420    PT LONG TERM GOAL #1   Title Demonstrate and verbalize proper prosthetic care to enable safe utilization of the prosthesis. (PT reset Target Date to 09/21/14 at renewal)   Baseline 08/22/2014- pt still needs cues on sock  management at times.   Time 4   Period Weeks   Status On-going   PT LONG TERM GOAL #2   Title Tolerate wear of prosthesis for >90% of awake hours without change in skin integrity nor undue tenderness. (PT reset Target Date to 09/21/14 at renewal)   Baseline 08/22/2014- Pt still getting soreness/pain with at end of limb, no skin issues.   Time 4   Period Weeks   Status On-going   PT LONG TERM GOAL #3   Title Ambulate 500 feet with LRAD and prosthesis modified independent. (PT reset Target Date to 09/21/14 at renewal)   Baseline 08/22/2014- met with RW/prosthesis, not with cane.   Time 4   Period Weeks  Status On-going   PT LONG TERM GOAL #4   Title Ambulate 75 feet around household furniture carring cup with single point cane and prosthesis modified independent. (PT reset Target Date to 09/21/14 at renewal)   Baseline 08/22/2014- needs min guard assist, however can ambulate up to 100 feet around furniture with cup of water using single point cane.   Time 4   Period Weeks   Status On-going   PT LONG TERM GOAL #5   Title Negotiate ramp, curb, stairs with LRAD and prosthesis modified independent. (PT reset Target Date to 09/21/14 at renewal)   Baseline 08/22/2014- met using RW/prosthesis and 2 rails on stairs.   Time 4   Period Weeks   Status Achieved   PT LONG TERM GOAL #6   Title Perform standing activities for >20 minutes with prosthesis with no pain. (PT reset Target Date to 09/21/14 at renewal)   Baseline 08/22/2014- reports increased distal tibia pain with increased activity/weight bearing with prosthesis.   Status On-going   PT LONG TERM GOAL #7   Title Berg Balance Test score with prosthesis >/= 45/56.    Baseline 08/22/2014- met with score of 49/56.   Status Achieved   PT LONG TERM GOAL #8   Title Self-report via FOTO improvement >10 points in Functional status. 08/23/2014   Baseline 08/22/2014- not assessed as pt is being renewed by PT   Status On-going   PT LONG TERM GOAL   #9   TITLE Demonstrate work simulated tasks wiht prosthesis safely. (PT reset Target Date to 09/21/14 at renewal)   Baseline 08/22/2014- not addressed as yet.   Status On-going               Plan - 08/29/14 1304    Clinical Impression Statement Pt demonstrated progress as she was able to tolerate dynamic and static balance activites without a rest break and did not experience any LOB episodes. however, pt did require seated rest break after ambulation due to fatigue and SOB. Continue with POC.   Pt will benefit from skilled therapeutic intervention in order to improve on the following deficits Abnormal gait;Decreased coordination;Difficulty walking;Decreased endurance;Decreased activity tolerance;Decreased balance;Decreased knowledge of use of DME;Decreased mobility;Decreased strength   Rehab Potential Good   PT Frequency 2x / week   PT Duration --  re-cert extended to 9/32/67.   PT Treatment/Interventions Therapeutic activities;Neuromuscular re-education;Gait training;Patient/family education;Therapeutic exercise;ADLs/Self Care Home Management;Balance training;Energy conservation;Other (comment)   PT Next Visit Plan Progress balance HEP as tolerated, dynamic gait training with SPC (trial compliant surface in bars).   Consulted and Agree with Plan of Care Patient        Problem List Patient Active Problem List   Diagnosis Date Noted  . Edema of left lower extremity 08/27/2014  . Rash 07/06/2014  . S/P BKA (below knee amputation) 03/20/2014  . 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 Critical lower limb ischemia 01/09/2014  . Right foot ulcer 11/07/2013  . Toe pain 09/06/2013  . Pre-ulcerative corn or callous 09/06/2013  . Lower extremity pain 09/06/2013  . End stage renal disease 04/06/2012  . Left low back pain 12/08/2011  . Hip bursitis, left 12/08/2011  . Left leg pain 12/08/2011  . Abdominal pain, bilateral lower quadrant 07/08/2011  . Preventative  health care 12/08/2010  . Allergic rhinitis, cause unspecified 12/08/2010  . Diabetes 05/21/2009  . SECONDARY HYPERPARATHYROIDISM 05/21/2009  . NUMBNESS 05/21/2009  . BACK PAIN 05/02/2009  . ANEMIA-IRON DEFICIENCY 01/25/2008  .  Essential hypertension 01/25/2008  . CERVICAL RADICULOPATHY, LEFT 01/25/2008  . Hyperlipidemia 03/30/2007    Azar South L 08/29/2014, 1:08 PM  [facility] [address] [place], [place], [address] Phone: [phone]   Fax:  [phone]  Geoffry Paradise, PT,DPT 08/29/2014 1:08 PM Phone: [phone] Fax: [phone]

## 2014-08-30 ENCOUNTER — Ambulatory Visit: Payer: BC Managed Care – PPO

## 2014-08-30 DIAGNOSIS — R6889 Other general symptoms and signs: Secondary | ICD-10-CM

## 2014-08-30 DIAGNOSIS — Z89511 Acquired absence of right leg below knee: Secondary | ICD-10-CM

## 2014-08-30 DIAGNOSIS — R269 Unspecified abnormalities of gait and mobility: Secondary | ICD-10-CM

## 2014-08-30 DIAGNOSIS — R5381 Other malaise: Secondary | ICD-10-CM

## 2014-08-30 DIAGNOSIS — Z5189 Encounter for other specified aftercare: Secondary | ICD-10-CM | POA: Diagnosis not present

## 2014-08-30 NOTE — Therapy (Signed)
[facility] [address] [place], [place], [address] Phone: [phone]   Fax:  [phone]  Physical Therapy Treatment  Patient Details  Name: Carolyn Valenzuela MRN: [id number] Date of Birth: 08/26/1951  Encounter Date: 08/30/2014      PT End of Session - 08/30/14 1536    Visit Number 16   Number of Visits 17   Date for PT Re-Evaluation 09/21/14   PT Start Time 1447   PT Stop Time 1527   PT Time Calculation (min) 40 min   Equipment Utilized During Treatment Gait belt   Activity Tolerance Patient tolerated treatment well   Behavior During Therapy [facility] for tasks assessed/performed      Past Medical History  Diagnosis Date  . DIABETES MELLITUS, UNCONTROLLED 05/21/2009  . HYPERLIPIDEMIA 03/30/2007  . ANEMIA-IRON DEFICIENCY 01/25/2008  . HYPERTENSION 01/25/2008  . SINUSITIS- ACUTE-NOS 01/25/2008  . SECONDARY HYPERPARATHYROIDISM 05/21/2009  . RENAL INSUFFICIENCY 02/01/2008  . CERVICAL RADICULOPATHY, LEFT 01/25/2008  . BACK PAIN 05/02/2009  . NUMBNESS 05/21/2009  . Allergic rhinitis, cause unspecified 12/08/2010  . Foot ulcer     right lateral malleolus  . Critical lower limb ischemia     Past Surgical History  Procedure Laterality Date  . Av fistula placement Left   . Eye surgery Bilateral     cataracts removed, left eye still has some oil in it.  . Colonoscopy    . Amputation Right 03/15/2014    Procedure: RIGHT  LEG  BELOW KNEE AMPUTATION ;  Surgeon: [name], MD;  Location: [facility];  Service: Orthopedics;  Laterality: Right;    There were no vitals taken for this visit.  Visit Diagnosis:  Activity intolerance  Abnormality of gait  Debility  Status post below knee amputation of right lower extremity      Subjective Assessment - 08/30/14 1450    Symptoms Pt denied falls since last visit. Pt reported she had difficulty getting the prosthesis on today, and she's unsure why as she has not done anything  differently, except for waking up later today. Pt reported she iced R LE and was then able to don prosthesis, but was not able to don thick sock due to swelling. Pt denied pain.  Pt was able to don thick sock at end of session to ambulate .   Limitations Walking;Standing   Patient Stated Goals To use prosthesis to walk in community & return to work   Currently in Pain? No/denies     Neuro re-ed: reviewed the balance HEP listed below with pt at counter. Pt able to complete independently and she reported she ceased HEP at home, as it was "so easy". Pt performed 5-10 reps per activity.  AT [place] AND PLACE FEET EQUAL DISTANCE FROM THE MIDLINE.  USE TAPE ON FLOOR TO MARK THE MIDLINE POSITION. You also should try to feel with your limb pressure in socket. You are trying to feel with limb what you used to feel with the bottom of your foot.  1. Side to Side Shift: Moving your hips only (not shoulders): move weight onto your left leg, HOLD/FEEL. Move back to equal weight on each leg, HOLD/FEEL. Move weight onto your right leg, HOLD/FEEL. Move back to equal weight on each leg, HOLD/FEEL. Repeat.  2. Front to Back Shift: Moving your hips only (not shoulders): move your weight forward onto your toes, HOLD/FEEL. Move your weight back to equal Flat Foot on both legs, HOLD/FEEL. Move your  weight back onto your heels, HOLD/FEEL. Move your weight back to equal on both legs, HOLD/FEEL. Repeat. 3. Moving Cones / Cups: With equal weight on each leg: Hold on with one hand the first time, then progress to no hand supports. Move cups from one side of sink to the other. Place cups ~2" out of your reach, progress to 10" beyond reach. 4. Overhead/Upward Reaching: alternated reaching up to top cabinets or ceiling if no cabinets present. Keep equal weight on each leg. Start with one hand support on counter while other hand reaches and progress to no hand support with reaching. 5. Looking Over  Shoulders: With equal weight on each leg: alternate turning to look over your shoulders with one hand support on counter as needed. Shift weight to side looking, pull hip then shoulder then head/eyes around to look behind you. Start with one hand support & progress to no hand support.  Therex: performed in parallel bars with 1 UE support. VC's and demonstration for technique and to improve posture. Standing: -B hip abduction, B hip ext., and B marches. (3x10/exercise). Rest breaks after each set due to fatigue.                [facility] Adult PT Treatment/Exercise - 08/30/14 1548    Ambulation/Gait   Ambulation/Gait Yes   Ambulation/Gait Assistance 4: Min guard;5: Supervision   Ambulation/Gait Assistance Details R prosthesis donned, pt ambulated with SPC and RW, while performing head turns over even terrain. Pt required seated rest breaks due to fatigue. VC's to improve upright posture.   Ambulation Distance (Feet) --  68' with SPC; 75'x2, 100', 50'x2 with RW   Assistive device Rolling walker;Straight cane   Gait Pattern Step-through pattern;Decreased stance time - right                  PT Short Term Goals - 08/08/14 1637    PT SHORT TERM GOAL #1   Title Verbalize proper prosthetic care except cues to adjust the number of ply socks. 07/27/2014   Status Achieved   PT SHORT TERM GOAL #2   Title Tolerate wear of prosthesis for >8 hours without change in skin integrity nor undue tenderness. 07/27/2014   Status On-going   PT SHORT TERM GOAL #3   Title Ambulate 300 feet with rolling walker and prosthesis modified independent. 07/27/2014   Status Achieved   PT SHORT TERM GOAL #4   Title Negotiate ramp, curb, stairs (1 rail) with RW and prosthesis with supervision. 07/27/2014   Status Achieved   PT SHORT TERM GOAL #5   Title Perform standing activities for >10 minutes with no pain. 07/27/2014   Status Achieved   PT SHORT TERM GOAL #6   Title Increase BERG balance test  score with prosthesis to >/= 35/56. 07/27/2014   Status Achieved   PT SHORT TERM GOAL #7   Title Verbalize understanding of fall prevention strategies. 07/27/2014   Status Achieved           PT Long Term Goals - 08/22/14 1420    PT LONG TERM GOAL #1   Title Demonstrate and verbalize proper prosthetic care to enable safe utilization of the prosthesis. (PT reset Target Date to 09/21/14 at renewal)   Baseline 08/22/2014- pt still needs cues on sock management at times.   Time 4   Period Weeks   Status On-going   PT LONG TERM GOAL #2   Title Tolerate wear of prosthesis for >90% of awake hours without change  in skin integrity nor undue tenderness. (PT reset Target Date to 09/21/14 at renewal)   Baseline 08/22/2014- Pt still getting soreness/pain with at end of limb, no skin issues.   Time 4   Period Weeks   Status On-going   PT LONG TERM GOAL #3   Title Ambulate 500 feet with LRAD and prosthesis modified independent. (PT reset Target Date to 09/21/14 at renewal)   Baseline 08/22/2014- met with RW/prosthesis, not with cane.   Time 4   Period Weeks   Status On-going   PT LONG TERM GOAL #4   Title Ambulate 75 feet around household furniture carring cup with single point cane and prosthesis modified independent. (PT reset Target Date to 09/21/14 at renewal)   Baseline 08/22/2014- needs min guard assist, however can ambulate up to 100 feet around furniture with cup of water using single point cane.   Time 4   Period Weeks   Status On-going   PT LONG TERM GOAL #5   Title Negotiate ramp, curb, stairs with LRAD and prosthesis modified independent. (PT reset Target Date to 09/21/14 at renewal)   Baseline 08/22/2014- met using RW/prosthesis and 2 rails on stairs.   Time 4   Period Weeks   Status Achieved   PT LONG TERM GOAL #6   Title Perform standing activities for >20 minutes with prosthesis with no pain. (PT reset Target Date to 09/21/14 at renewal)   Baseline 08/22/2014- reports increased  distal tibia pain with increased activity/weight bearing with prosthesis.   Status On-going   PT LONG TERM GOAL #7   Title Berg Balance Test score with prosthesis >/= 45/56.    Baseline 08/22/2014- met with score of 49/56.   Status Achieved   PT LONG TERM GOAL #8   Title Self-report via FOTO improvement >10 points in Functional status. 08/23/2014   Baseline 08/22/2014- not assessed as pt is being renewed by PT   Status On-going   PT LONG TERM GOAL  #9   TITLE Demonstrate work simulated tasks wiht prosthesis safely. (PT reset Target Date to 09/21/14 at renewal)   Baseline 08/22/2014- not addressed as yet.   Status On-going               Plan - 08/30/14 1537    Clinical Impression Statement Pt demonstrated progress as she was able to ambulate with [facility] and supervision today. Pt was also able to perform original sink balance HEP safely and independently and would benefit from progressed balance HEP. Pt would continue to benefit from skilled PT to improve safety during functional mobility and independence.   Pt will benefit from skilled therapeutic intervention in order to improve on the following deficits Abnormal gait;Decreased coordination;Difficulty walking;Decreased endurance;Decreased activity tolerance;Decreased balance;Decreased knowledge of use of DME;Decreased mobility;Decreased strength  prosthesis dependency   Rehab Potential Good   PT Frequency 2x / week   PT Duration --  re-cert extended to 9/56/21   PT Treatment/Interventions Therapeutic activities;Neuromuscular re-education;Gait training;Patient/family education;Therapeutic exercise;ADLs/Self Care Home Management;Balance training;Energy conservation;Other (comment)  prosthesis management.   PT Next Visit Plan Progress balance HEP as tolerated, dynamic gait training with SPC (trial compliant surface in bars).   Consulted and Agree with Plan of Care Patient        Problem List Patient Active Problem List   Diagnosis  Date Noted  . Edema of left lower extremity 08/27/2014  . Rash 07/06/2014  . S/P BKA (below knee amputation) 03/20/2014  . Diabetic wet gangrene of the foot - Right  03/15/2014  . Critical lower limb ischemia 01/09/2014  . Right foot ulcer 11/07/2013  . Toe pain 09/06/2013  . Pre-ulcerative corn or callous 09/06/2013  . Lower extremity pain 09/06/2013  . End stage renal disease 04/06/2012  . Left low back pain 12/08/2011  . Hip bursitis, left 12/08/2011  . Left leg pain 12/08/2011  . Abdominal pain, bilateral lower quadrant 07/08/2011  . Preventative health care 12/08/2010  . Allergic rhinitis, cause unspecified 12/08/2010  . Diabetes 05/21/2009  . SECONDARY HYPERPARATHYROIDISM 05/21/2009  . NUMBNESS 05/21/2009  . BACK PAIN 05/02/2009  . ANEMIA-IRON DEFICIENCY 01/25/2008  . Essential hypertension 01/25/2008  . CERVICAL RADICULOPATHY, LEFT 01/25/2008  . Hyperlipidemia 03/30/2007    Adren Dollins L 08/30/2014, 3:50 PM  [facility] [address] [place], [place], [address] Phone: [phone]   Fax:  [phone]     Geoffry Paradise, PT,DPT 08/30/2014 3:50 PM Phone: [phone] Fax: [phone]

## 2014-09-01 ENCOUNTER — Emergency Department (HOSPITAL_COMMUNITY): Payer: BC Managed Care – PPO

## 2014-09-01 ENCOUNTER — Inpatient Hospital Stay (HOSPITAL_COMMUNITY)
Admission: EM | Admit: 2014-09-01 | Discharge: 2014-09-13 | DRG: 673 | Disposition: A | Discharge status: Home Health | Payer: BC Managed Care – PPO | Attending: Internal Medicine | Admitting: Internal Medicine

## 2014-09-01 ENCOUNTER — Encounter (HOSPITAL_COMMUNITY): Payer: Self-pay | Admitting: Physical Medicine and Rehabilitation

## 2014-09-01 DIAGNOSIS — J810 Acute pulmonary edema: Secondary | ICD-10-CM | POA: Diagnosis present

## 2014-09-01 DIAGNOSIS — Z89519 Acquired absence of unspecified leg below knee: Secondary | ICD-10-CM

## 2014-09-01 DIAGNOSIS — N186 End stage renal disease: Secondary | ICD-10-CM | POA: Diagnosis present

## 2014-09-01 DIAGNOSIS — Y95 Nosocomial condition: Secondary | ICD-10-CM | POA: Diagnosis not present

## 2014-09-01 DIAGNOSIS — I517 Cardiomegaly: Secondary | ICD-10-CM | POA: Diagnosis present

## 2014-09-01 DIAGNOSIS — E872 Acidosis: Secondary | ICD-10-CM | POA: Diagnosis present

## 2014-09-01 DIAGNOSIS — Z992 Dependence on renal dialysis: Secondary | ICD-10-CM | POA: Diagnosis not present

## 2014-09-01 DIAGNOSIS — J189 Pneumonia, unspecified organism: Secondary | ICD-10-CM | POA: Diagnosis not present

## 2014-09-01 DIAGNOSIS — N2581 Secondary hyperparathyroidism of renal origin: Secondary | ICD-10-CM | POA: Diagnosis present

## 2014-09-01 DIAGNOSIS — Z7982 Long term (current) use of aspirin: Secondary | ICD-10-CM | POA: Diagnosis not present

## 2014-09-01 DIAGNOSIS — I498 Other specified cardiac arrhythmias: Secondary | ICD-10-CM | POA: Diagnosis present

## 2014-09-01 DIAGNOSIS — I120 Hypertensive chronic kidney disease with stage 5 chronic kidney disease or end stage renal disease: Principal | ICD-10-CM | POA: Diagnosis present

## 2014-09-01 DIAGNOSIS — R509 Fever, unspecified: Secondary | ICD-10-CM

## 2014-09-01 DIAGNOSIS — N179 Acute kidney failure, unspecified: Secondary | ICD-10-CM

## 2014-09-01 DIAGNOSIS — E1122 Type 2 diabetes mellitus with diabetic chronic kidney disease: Secondary | ICD-10-CM | POA: Diagnosis present

## 2014-09-01 DIAGNOSIS — R0602 Shortness of breath: Secondary | ICD-10-CM

## 2014-09-01 DIAGNOSIS — E876 Hypokalemia: Secondary | ICD-10-CM | POA: Diagnosis not present

## 2014-09-01 DIAGNOSIS — A047 Enterocolitis due to Clostridium difficile: Secondary | ICD-10-CM | POA: Diagnosis present

## 2014-09-01 DIAGNOSIS — N189 Chronic kidney disease, unspecified: Secondary | ICD-10-CM

## 2014-09-01 DIAGNOSIS — I10 Essential (primary) hypertension: Secondary | ICD-10-CM | POA: Diagnosis present

## 2014-09-01 DIAGNOSIS — E871 Hypo-osmolality and hyponatremia: Secondary | ICD-10-CM | POA: Diagnosis present

## 2014-09-01 DIAGNOSIS — E11649 Type 2 diabetes mellitus with hypoglycemia without coma: Secondary | ICD-10-CM | POA: Diagnosis present

## 2014-09-01 DIAGNOSIS — J811 Chronic pulmonary edema: Secondary | ICD-10-CM | POA: Diagnosis present

## 2014-09-01 DIAGNOSIS — D631 Anemia in chronic kidney disease: Secondary | ICD-10-CM | POA: Diagnosis present

## 2014-09-01 DIAGNOSIS — Z89511 Acquired absence of right leg below knee: Secondary | ICD-10-CM

## 2014-09-01 DIAGNOSIS — R5081 Fever presenting with conditions classified elsewhere: Secondary | ICD-10-CM | POA: Diagnosis not present

## 2014-09-01 DIAGNOSIS — T17508A Unspecified foreign body in bronchus causing other injury, initial encounter: Secondary | ICD-10-CM

## 2014-09-01 LAB — CBC WITH DIFFERENTIAL/PLATELET
Basophils Absolute: 0 10*3/uL (ref 0.0–0.1)
Basophils Relative: 1 % (ref 0–1)
Eosinophils Absolute: 0 10*3/uL (ref 0.0–0.7)
Eosinophils Relative: 0 % (ref 0–5)
HCT: 23.7 % — ABNORMAL LOW (ref 36.0–46.0)
Hemoglobin: 7.9 g/dL — ABNORMAL LOW (ref 12.0–15.0)
Lymphocytes Relative: 12 % (ref 12–46)
Lymphs Abs: 0.8 10*3/uL (ref 0.7–4.0)
MCH: 29.6 pg (ref 26.0–34.0)
MCHC: 33.3 g/dL (ref 30.0–36.0)
MCV: 88.8 fL (ref 78.0–100.0)
Monocytes Absolute: 0.2 10*3/uL (ref 0.1–1.0)
Monocytes Relative: 3 % (ref 3–12)
Neutro Abs: 5.9 10*3/uL (ref 1.7–7.7)
Neutrophils Relative %: 84 % — ABNORMAL HIGH (ref 43–77)
Platelets: 249 10*3/uL (ref 150–400)
RBC: 2.67 MIL/uL — ABNORMAL LOW (ref 3.87–5.11)
RDW: 14.9 % (ref 11.5–15.5)
WBC: 6.9 10*3/uL (ref 4.0–10.5)

## 2014-09-01 LAB — URINALYSIS, ROUTINE W REFLEX MICROSCOPIC
Bilirubin Urine: NEGATIVE
Glucose, UA: 500 mg/dL — AB
Ketones, ur: NEGATIVE mg/dL
Leukocytes, UA: NEGATIVE
Nitrite: NEGATIVE
Protein, ur: >300 mg/dL — AB
Specific Gravity, Urine: 1.015 (ref 1.005–1.030)
Urobilinogen, UA: 0.2 mg/dL (ref 0.0–1.0)
pH: 5.5 (ref 5.0–8.0)

## 2014-09-01 LAB — COMPREHENSIVE METABOLIC PANEL
ALT: 15 U/L (ref 0–35)
AST: 23 U/L (ref 0–37)
Albumin: 2.5 g/dL — ABNORMAL LOW (ref 3.5–5.2)
Alkaline Phosphatase: 81 U/L (ref 39–117)
Anion gap: 16 — ABNORMAL HIGH (ref 5–15)
BUN: 71 mg/dL — ABNORMAL HIGH (ref 6–23)
CO2: 12 mmol/L — ABNORMAL LOW (ref 19–32)
Calcium: 7.4 mg/dL — ABNORMAL LOW (ref 8.4–10.5)
Chloride: 105 mEq/L (ref 96–112)
Creatinine, Ser: 9.2 mg/dL — ABNORMAL HIGH (ref 0.50–1.10)
GFR calc Af Amer: 5 mL/min — ABNORMAL LOW (ref >90)
GFR calc non Af Amer: 4 mL/min — ABNORMAL LOW (ref >90)
Glucose, Bld: 305 mg/dL — ABNORMAL HIGH (ref 70–99)
Potassium: 4.8 mmol/L (ref 3.5–5.1)
Sodium: 133 mmol/L — ABNORMAL LOW (ref 135–145)
Total Bilirubin: 0.4 mg/dL (ref 0.3–1.2)
Total Protein: 6.9 g/dL (ref 6.0–8.3)

## 2014-09-01 LAB — GLUCOSE, CAPILLARY
Glucose-Capillary: 217 mg/dL — ABNORMAL HIGH (ref 70–99)
Glucose-Capillary: 70 mg/dL (ref 70–99)
Glucose-Capillary: 96 mg/dL (ref 70–99)

## 2014-09-01 LAB — URINE MICROSCOPIC-ADD ON

## 2014-09-01 LAB — BRAIN NATRIURETIC PEPTIDE: B Natriuretic Peptide: 506.9 pg/mL — ABNORMAL HIGH (ref 0.0–100.0)

## 2014-09-01 LAB — I-STAT TROPONIN, ED: Troponin i, poc: 0.04 ng/mL (ref 0.00–0.08)

## 2014-09-01 MED ORDER — CLONIDINE HCL 0.1 MG PO TABS
0.1000 mg | ORAL_TABLET | Freq: Two times a day (BID) | ORAL | Status: DC
  Administered 2014-09-01 – 2014-09-13 (×21): 0.1 mg via ORAL
  Filled 2014-09-01 (×26): qty 1

## 2014-09-01 MED ORDER — CALCIUM CARBONATE ANTACID 500 MG PO CHEW
400.0000 mg | CHEWABLE_TABLET | Freq: Three times a day (TID) | ORAL | Status: DC
  Administered 2014-09-01 – 2014-09-13 (×29): 400 mg via ORAL
  Filled 2014-09-01 (×38): qty 2

## 2014-09-01 MED ORDER — GABAPENTIN 100 MG PO CAPS
100.0000 mg | ORAL_CAPSULE | Freq: Every day | ORAL | Status: DC
  Administered 2014-09-01 – 2014-09-12 (×12): 100 mg via ORAL
  Filled 2014-09-01 (×14): qty 1

## 2014-09-01 MED ORDER — HEPARIN SODIUM (PORCINE) 5000 UNIT/ML IJ SOLN
5000.0000 [IU] | Freq: Three times a day (TID) | INTRAMUSCULAR | Status: DC
  Administered 2014-09-01 – 2014-09-06 (×13): 5000 [IU] via SUBCUTANEOUS
  Filled 2014-09-01 (×18): qty 1

## 2014-09-01 MED ORDER — NITROGLYCERIN IN D5W 200-5 MCG/ML-% IV SOLN
5.0000 ug/min | INTRAVENOUS | Status: DC
  Filled 2014-09-01: qty 250

## 2014-09-01 MED ORDER — AMLODIPINE BESYLATE 10 MG PO TABS
10.0000 mg | ORAL_TABLET | Freq: Every day | ORAL | Status: DC
  Administered 2014-09-02 – 2014-09-13 (×10): 10 mg via ORAL
  Filled 2014-09-01 (×12): qty 1

## 2014-09-01 MED ORDER — ONDANSETRON HCL 4 MG PO TABS: 4.0000 mg | ORAL_TABLET | Freq: Four times a day (QID) | ORAL | Status: DC | PRN

## 2014-09-01 MED ORDER — ONDANSETRON HCL 4 MG/2ML IJ SOLN
4.0000 mg | Freq: Four times a day (QID) | INTRAMUSCULAR | Status: DC | PRN
  Administered 2014-09-04 – 2014-09-09 (×2): 4 mg via INTRAVENOUS
  Filled 2014-09-01 (×3): qty 2

## 2014-09-01 MED ORDER — FUROSEMIDE 10 MG/ML IJ SOLN
120.0000 mg | Freq: Three times a day (TID) | INTRAVENOUS | Status: DC
  Administered 2014-09-01 – 2014-09-03 (×5): 120 mg via INTRAVENOUS
  Filled 2014-09-01 (×8): qty 12

## 2014-09-01 MED ORDER — FUROSEMIDE 10 MG/ML IJ SOLN
160.0000 mg | Freq: Once | INTRAVENOUS | Status: DC
  Filled 2014-09-01: qty 16

## 2014-09-01 MED ORDER — ADULT MULTIVITAMIN W/MINERALS CH
1.0000 | ORAL_TABLET | Freq: Every day | ORAL | Status: DC
  Administered 2014-09-02 – 2014-09-05 (×3): 1 via ORAL
  Filled 2014-09-01 (×7): qty 1

## 2014-09-01 MED ORDER — ASPIRIN EC 81 MG PO TBEC
81.0000 mg | DELAYED_RELEASE_TABLET | Freq: Every day | ORAL | Status: DC
  Administered 2014-09-02 – 2014-09-13 (×10): 81 mg via ORAL
  Filled 2014-09-01 (×12): qty 1

## 2014-09-01 MED ORDER — INSULIN ASPART 100 UNIT/ML ~~LOC~~ SOLN
0.0000 [IU] | Freq: Three times a day (TID) | SUBCUTANEOUS | Status: DC
  Administered 2014-09-01: 3 [IU] via SUBCUTANEOUS

## 2014-09-01 MED ORDER — DEXTROSE 5 % IV SOLN
160.0000 mg | Freq: Once | INTRAVENOUS | Status: AC
  Administered 2014-09-01: 160 mg via INTRAVENOUS
  Filled 2014-09-01: qty 16

## 2014-09-01 MED ORDER — ALBUTEROL SULFATE (2.5 MG/3ML) 0.083% IN NEBU
2.5000 mg | INHALATION_SOLUTION | RESPIRATORY_TRACT | Status: DC | PRN
Start: 2014-09-01 — End: 2014-09-13

## 2014-09-01 MED ORDER — ACETAMINOPHEN 325 MG PO TABS
650.0000 mg | ORAL_TABLET | Freq: Four times a day (QID) | ORAL | Status: DC | PRN
  Administered 2014-09-01 – 2014-09-13 (×20): 650 mg via ORAL
  Filled 2014-09-01 (×20): qty 2

## 2014-09-01 MED ORDER — SODIUM BICARBONATE 650 MG PO TABS
650.0000 mg | ORAL_TABLET | Freq: Two times a day (BID) | ORAL | Status: DC
  Administered 2014-09-01 – 2014-09-02 (×4): 650 mg via ORAL
  Filled 2014-09-01 (×6): qty 1

## 2014-09-01 MED ORDER — ACETAMINOPHEN 650 MG RE SUPP
650.0000 mg | Freq: Four times a day (QID) | RECTAL | Status: DC | PRN
  Filled 2014-09-01: qty 1

## 2014-09-01 MED ORDER — CETYLPYRIDINIUM CHLORIDE 0.05 % MT LIQD
7.0000 mL | Freq: Two times a day (BID) | OROMUCOSAL | Status: DC
  Administered 2014-09-01 – 2014-09-03 (×3): 7 mL via OROMUCOSAL

## 2014-09-01 MED ORDER — FERROUS SULFATE 325 (65 FE) MG PO TABS
325.0000 mg | ORAL_TABLET | Freq: Every day | ORAL | Status: DC
  Administered 2014-09-02 – 2014-09-13 (×9): 325 mg via ORAL
  Filled 2014-09-01 (×13): qty 1

## 2014-09-01 MED ORDER — SODIUM CHLORIDE 0.9 % IJ SOLN
3.0000 mL | Freq: Two times a day (BID) | INTRAMUSCULAR | Status: DC
  Administered 2014-09-01 – 2014-09-13 (×19): 3 mL via INTRAVENOUS

## 2014-09-01 NOTE — Progress Notes (Signed)
Patient's BP 187/69.  Dr. Conley Canal notified, clonidine ordered.  Will administer when available from pharmacy and continue to monitor.

## 2014-09-01 NOTE — Consult Note (Signed)
ZERA AMMANN Admit Date: 09/01/2014 09/01/2014 Rexene Agent Requesting Physician:  Maryan Rued, MD  Reason for Consult:  SOB, CKD, Volume Overload HPI:  29F CKD5 (SCr in 7s, [place] CKA, presumed 2/2 DM2, and HTN) admitted with SOB.  Pt has had progressive symptoms x7d including LEE and wheezing/dyspnea.  No clear dietary Na or Fluid discretions.  Has been taking furosemide.  No cough, F/C, URI SX, Sick contacts.  No hx/o COPD/Asthma.  Symptoms in ED imprvoed with NTG gtt.    SHe has bee nslow in her HD planning, having a failed LUE RC AVF but no followup access to date, partially 2/2 trying to get prosthesis for recent R BKA.  Wants to wait to start HD as long as possible but will plan on HD when needed.  SCr trend below shows clear progression of GFR decline.    No AM nausea, hiccups, fatigue, itching.     CREATININE, SER (mg/dL)  Date Value  09/01/2014 9.20*  08/07/2014 7.78*  07/10/2014 7.68*  07/06/2014 7.6*  06/12/2014 6.93*  05/08/2014 6.26*  03/21/2014 5.34*  03/18/2014 5.20*  03/17/2014 5.06*  03/16/2014 5.42*  ] I/Os: none documented so far  ROS NSAIDS: none IV Contrast none TMP/SMX none Hypotension none Balance of 12 systems is negative w/ exceptions as above  PMH  Past Medical History  Diagnosis Date  . DIABETES MELLITUS, UNCONTROLLED 05/21/2009  . HYPERLIPIDEMIA 03/30/2007  . ANEMIA-IRON DEFICIENCY 01/25/2008  . HYPERTENSION 01/25/2008  . SINUSITIS- ACUTE-NOS 01/25/2008  . SECONDARY HYPERPARATHYROIDISM 05/21/2009  . RENAL INSUFFICIENCY 02/01/2008  . CERVICAL RADICULOPATHY, LEFT 01/25/2008  . BACK PAIN 05/02/2009  . NUMBNESS 05/21/2009  . Allergic rhinitis, cause unspecified 12/08/2010  . Foot ulcer     right lateral malleolus  . Critical lower limb ischemia    PSH  Past Surgical History  Procedure Laterality Date  . Av fistula placement Left   . Eye surgery Bilateral     cataracts removed, left eye still has some oil in it.  . Colonoscopy    .  Amputation Right 03/15/2014    Procedure: RIGHT  LEG  BELOW KNEE AMPUTATION ;  Surgeon: [name], MD;  Location: [facility];  Service: Orthopedics;  Laterality: Right;   FH  Family History  Problem Relation Age of Onset  . Cancer Mother     Pancreatic   SH  reports that she has never smoked. She has never used smokeless tobacco. She reports that she does not drink alcohol or use illicit drugs. Allergies  Allergies  Allergen Reactions  . Oxycodone Itching   Home medications Prior to Admission medications   Medication Sig Start Date End Date Taking? Authorizing Provider  amLODipine (NORVASC) 10 MG tablet TAKE ONE TABLET BY MOUTH ONCE DAILY 07/31/14  Yes Biagio Borg, MD  aspirin 81 MG EC tablet Take 81 mg by mouth daily.     Yes Historical Provider, MD  ferrous sulfate (FERROUSUL) 325 (65 FE) MG tablet Take 325 mg by mouth daily with breakfast.   Yes Historical Provider, MD  furosemide (LASIX) 40 MG tablet Take 40-80 mg by mouth 2 (two) times daily. 80mg  in the am and 40mg  in the pm   Yes Historical Provider, MD  gabapentin (NEURONTIN) 100 MG capsule TAKE ONE CAPSULE BY MOUTH TWICE DAILY Patient taking differently: Take 100 mg by mouth 2 (two) times daily.  07/06/14  Yes Biagio Borg, MD  glimepiride (AMARYL) 4 MG tablet Take 1/2 tab by mouth per day Patient taking  differently: Take 2 mg by mouth daily with breakfast.  04/12/14  Yes Biagio Borg, MD  Multiple Vitamin (MULTIVITAMIN WITH MINERALS) TABS tablet Take 1 tablet by mouth daily.   Yes Historical Provider, MD  carvedilol (COREG) 12.5 MG tablet TAKE ONE TABLET BY MOUTH TWICE DAILY WITH A MEAL Patient not taking: Reported on 09/01/2014 05/30/14   Meredith Staggers, MD    Current Medications Scheduled Meds:  Continuous Infusions: . furosemide     PRN Meds:.  CBC  Recent Labs Lab 09/01/14 1047  WBC 6.9  NEUTROABS 5.9  HGB 7.9*  HCT 23.7*  MCV 88.8  PLT 0000000   Basic Metabolic Panel  Recent Labs Lab 09/01/14 1047  NA 133*   K 4.8  CL 105  CO2 12*  GLUCOSE 305*  BUN 71*  CREATININE 9.20*  CALCIUM 7.4*    Physical Exam  Blood pressure 178/78, pulse 85, temperature 97.7 F (36.5 C), temperature source Oral, resp. rate 14, height 5\' 2"  (1.575 m), weight 52.164 kg (115 lb), SpO2 100 %. GEN: NAD, talks in full sentences ENT: NCAT, poor dentition NECK: JVD at 45deg to angle of mandible EYES: EOMI CV: RRR, no rub, nl s1s2 PULM: R>L Bibasilar crackles; no wheezing.   ABD: s/nt/nd SKIN: no rashes/lesions EXT:LLE with 1-2+ Pitting LEE   Assessment/Plan 14F with progressive CKD presenting with pulmonary edema / SOB  1. Progressive CKD with Volume Overload 1. Discussed that pt with vanishingly low GFR, clear decline over past few months and best course is initiation RRT this hospitalization 2. She wishes to wait, sleep / pray over it, and see if  Responds to diuretics 3. Would need TDC and permanent access 4. I let her know I don't think she'll get by w/o RRT, and we will re-eval tomorrow 2. HTN/Volume Overload / pulmonary edema /SOB 1. Largely as per #1 2. Cont Lasix at 120 IV TID 3. Na restriction Daily weights, Daily Renal Panel, Strict I/Os Cont CCB and BB, on NTG gtt currently 3. Anemia 1. On ESA as outpt 2. Would not transfuse at this time given Vol O/L 3. Fe replete as of 08/07/14 4. Follow for now 4. Metablic Acidosis 1. At least some contribution to SOB 2. Gently replace with NaHCO3 650 BID 3. Probably will be addressed with HD 5. 2HPTH / Hyperphosphatemia 1. Phos uncontrolled, start Tums qAC 2. PTH acceptable historically 6. DM2: per admitting team   Pearson Grippe MD [phone] [phone] 09/01/2014, 1:33 PM

## 2014-09-01 NOTE — ED Notes (Signed)
Pt placed on bedside commode.  

## 2014-09-01 NOTE — ED Provider Notes (Addendum)
CSN: [id number]     Arrival date & time 09/01/14  1045 History   First MD Initiated Contact with Patient 09/01/14 1045     Chief Complaint  Patient presents with  . Shortness of Breath     (Consider location/radiation/quality/duration/timing/severity/associated sxs/prior Treatment) Patient is a 64 y.o. female presenting with shortness of breath. The history is provided by the patient and the EMS personnel.  Shortness of Breath Severity:  Severe Onset quality:  Gradual Duration:  1 week Timing:  Constant Progression:  Worsening Chronicity:  New Context comment:  Today pt was hypoglycemic which she states happens often and she crawled into the kitchen and started to drink some soda, then called her sister who noticed the SOB Relieved by:  Sitting up Worsened by:  Activity (lying down) Ineffective treatments:  None tried Associated symptoms: cough   Associated symptoms: no abdominal pain, no chest pain, no fever, no sputum production, no vomiting and no wheezing   Associated symptoms comment:  Noticed swelling in the legs and abd Risk factors: no hx of PE/DVT, no prolonged immobilization, no recent surgery and no tobacco use   Risk factors comment:  Hx of CKD not on dialysis   Past Medical History  Diagnosis Date  . DIABETES MELLITUS, UNCONTROLLED 05/21/2009  . HYPERLIPIDEMIA 03/30/2007  . ANEMIA-IRON DEFICIENCY 01/25/2008  . HYPERTENSION 01/25/2008  . SINUSITIS- ACUTE-NOS 01/25/2008  . SECONDARY HYPERPARATHYROIDISM 05/21/2009  . RENAL INSUFFICIENCY 02/01/2008  . CERVICAL RADICULOPATHY, LEFT 01/25/2008  . BACK PAIN 05/02/2009  . NUMBNESS 05/21/2009  . Allergic rhinitis, cause unspecified 12/08/2010  . Foot ulcer     right lateral malleolus  . Critical lower limb ischemia    Past Surgical History  Procedure Laterality Date  . Av fistula placement Left   . Eye surgery Bilateral     cataracts removed, left eye still has some oil in it.  . Colonoscopy    . Amputation Right 03/15/2014     Procedure: RIGHT  LEG  BELOW KNEE AMPUTATION ;  Surgeon: [name], MD;  Location: [place] [place];  Service: Orthopedics;  Laterality: Right;   Family History  Problem Relation Age of Onset  . Cancer Mother     Pancreatic   History  Substance Use Topics  . Smoking status: Never Smoker   . Smokeless tobacco: Never Used  . Alcohol Use: No   OB History    No data available     Review of Systems  Constitutional: Negative for fever.  Respiratory: Positive for cough and shortness of breath. Negative for sputum production and wheezing.   Cardiovascular: Negative for chest pain.  Gastrointestinal: Negative for vomiting and abdominal pain.  All other systems reviewed and are negative.     Allergies  Oxycodone  Home Medications   Prior to Admission medications   Medication Sig Start Date End Date Taking? Authorizing Provider  amLODipine (NORVASC) 10 MG tablet TAKE ONE TABLET BY MOUTH ONCE DAILY 07/31/14  Yes Biagio Borg, MD  aspirin 81 MG EC tablet Take 81 mg by mouth daily.     Yes Historical Provider, MD  ferrous sulfate (FERROUSUL) 325 (65 FE) MG tablet Take 325 mg by mouth daily with breakfast.   Yes Historical Provider, MD  furosemide (LASIX) 40 MG tablet Take 40-80 mg by mouth 2 (two) times daily. 80mg  in the am and 40mg  in the pm   Yes Historical Provider, MD  gabapentin (NEURONTIN) 100 MG capsule TAKE ONE CAPSULE BY MOUTH TWICE DAILY Patient taking differently:  Take 100 mg by mouth 2 (two) times daily.  07/06/14  Yes Biagio Borg, MD  glimepiride (AMARYL) 4 MG tablet Take 1/2 tab by mouth per day Patient taking differently: Take 2 mg by mouth daily with breakfast.  04/12/14  Yes Biagio Borg, MD  Multiple Vitamin (MULTIVITAMIN WITH MINERALS) TABS tablet Take 1 tablet by mouth daily.   Yes Historical Provider, MD  carvedilol (COREG) 12.5 MG tablet TAKE ONE TABLET BY MOUTH TWICE DAILY WITH A MEAL Patient not taking: Reported on 09/01/2014 05/30/14   Meredith Staggers, MD   BP 176/70 mmHg   Pulse 92  Temp(Src) 97.5 F (36.4 C) (Oral)  Resp 22  Ht 5\' 2"  (1.575 m)  Wt 115 lb (52.164 kg)  BMI 21.03 kg/m2  SpO2 99% Physical Exam  Constitutional: She is oriented to person, place, and time. She appears well-developed and well-nourished. No distress.  HENT:  Head: Normocephalic and atraumatic.  Eyes: EOM are normal. Pupils are equal, round, and reactive to light.  Neck: JVD present.  Cardiovascular: Normal rate, regular rhythm, normal heart sounds and intact distal pulses.  Exam reveals no friction rub.   No murmur heard. Pulmonary/Chest: Effort normal. Tachypnea noted. No respiratory distress. She has no decreased breath sounds. She has wheezes. She has no rales.  Abdominal: Soft. Bowel sounds are normal. She exhibits distension. There is no tenderness. There is no rebound and no guarding.  Musculoskeletal: Normal range of motion. She exhibits no tenderness.  Right BKA.  Left lower ext pitting edema to the knee  Neurological: She is alert and oriented to person, place, and time. No cranial nerve deficit.  Skin: Skin is warm and dry. No rash noted.  Psychiatric: She has a normal mood and affect. Her behavior is normal.  Nursing note and vitals reviewed.   ED Course  Procedures (including critical care time) Labs Review Labs Reviewed  CBC WITH DIFFERENTIAL - Abnormal; Notable for the following:    RBC 2.67 (*)    Hemoglobin 7.9 (*)    HCT 23.7 (*)    Neutrophils Relative % 84 (*)    All other components within normal limits  COMPREHENSIVE METABOLIC PANEL - Abnormal; Notable for the following:    Sodium 133 (*)    CO2 12 (*)    Glucose, Bld 305 (*)    BUN 71 (*)    Creatinine, Ser 9.20 (*)    Calcium 7.4 (*)    Albumin 2.5 (*)    GFR calc non Af Amer 4 (*)    GFR calc Af Amer 5 (*)    Anion gap 16 (*)    All other components within normal limits  BRAIN NATRIURETIC PEPTIDE - Abnormal; Notable for the following:    B Natriuretic Peptide 506.9 (*)    All other  components within normal limits  I-STAT TROPOININ, ED    Imaging Review Dg Chest Port 1 View  09/01/2014   CLINICAL DATA:  Pt is SOB and wheezing this morning. Blood sugar is low. Hx HTN and diabetes  EXAM: PORTABLE CHEST - 1 VIEW  COMPARISON:  03/15/2014  FINDINGS: New patchy interstitial and airspace opacities bilaterally involving bases more than apices. Heart size upper limits normal for technique. A hair pin projects over the left mainstem bronchus. No effusion. Visualized skeletal structures are unremarkable.  IMPRESSION: 1. New bibasilar interstitial and airspace infiltrates or edema. 2. Hair pin projects over the left mainstem bronchus. Correlate with physical exam to confirm this is  external to the patient. Critical Value/emergent results were called by telephone at the time of interpretation on 09/01/2014 at 11:25 am to Dr. Blanchie Dessert , who verbally acknowledged these results.   Electronically Signed   By: Arne Cleveland M.D.   On: 09/01/2014 11:25     EKG Interpretation   Date/Time:  Saturday September 01 2014 10:59:27 EST Ventricular Rate:  94 PR Interval:    QRS Duration: 81 QT Interval:  444 QTC Calculation: 555 R Axis:   79 Text Interpretation:  Accelerated junctional rhythm Low voltage,  precordial leads Prolonged QT interval Artifact in lead(s) I II III aVR  aVL aVF V1 V2 No significant change since last tracing Confirmed by  Maryan Rued  MD, Loree Fee ([id number]) on 09/01/2014 11:35:03 AM      MDM   Final diagnoses:  SOB (shortness of breath)  Foreign body in bronchus  Acute on chronic renal failure  Acute pulmonary edema    Patient presents with symptoms concerning for CHF. She has a significant history of renal disease currently not on dialysis, diabetes and status post BKA on the right less than a year ago. She notes worsening shortness of breath over the last 1 week and today symptoms were significantly worse. It started when she had to crawl into her kitchen because  of hypoglycemia and started to drink juice. However when sister got there noted shortness of breath. No documented hypoxia here or by EMS. Patient does have wheezing on exam. She has no significant prior lung issues such as COPD or asthma. She does not use inhalers regularly. She's been taking all of her home medications but has noticed weight gain and swelling.  She is to Hear but not using accessory muscles and in no acute distress. Wheezing on exam and signs of fluid overload. She denies any chest pain at this time.   CBC, CMP, BNP, troponin, chest x-ray pending. EKG without acute findings.  11:33 AM Chest x-ray shows new interstitial edema.  Labs consistent with worsening renal function which is most likely the cause of fluid overload.  Will discuss with renal and admit to hospitalist.  Pt started on NTG gtt.  Will discuss lasix with renal she has taken her regular 80mg  this morning.  1:03 PM Spoke with Dr. Joelyn Oms who will see pt and recommended 160 of lasix  CRITICAL CARE Performed by: Blanchie Dessert Total critical care time: 30 Critical care time was exclusive of separately billable procedures and treating other patients. Critical care was necessary to treat or prevent imminent or life-threatening deterioration. Critical care was time spent personally by me on the following activities: development of treatment plan with patient and/or surrogate as well as nursing, discussions with consultants, evaluation of patient's response to treatment, examination of patient, obtaining history from patient or surrogate, ordering and performing treatments and interventions, ordering and review of laboratory studies, ordering and review of radiographic studies, pulse oximetry and re-evaluation of patient's condition.   Blanchie Dessert, MD 09/01/14 1256  Blanchie Dessert, MD 09/01/14 [place] MD 09/01/14 [place], MD 09/01/14 1304

## 2014-09-01 NOTE — ED Notes (Signed)
Patient transported to X-ray 

## 2014-09-01 NOTE — Progress Notes (Signed)
Patient arrived on unit from ED, vital signs stable, BP 175/72, consistent with ED blood pressures.  IV Lasix infusing.  Patient had incontinent stool, states that this is her third BM in 24 hours.  Assisted to [facility] with one assist.  Placed on contact-enteric precautions.  Patient and family updated.  Will continue to monitor.

## 2014-09-01 NOTE — ED Notes (Signed)
Pt presents to department via [facility] from home for evaluation of SOB. Pt lives at home alone, R leg BKA, pt crawled into kitchen this morning to get drink, sister reports low blood sugar and SOB when she went to check on patient this morning. Pt is alert and oriented x4 upon arrival. Expiratory wheezing upon arrival. Received 5mg  Albuterol per EMS. CBG 264.

## 2014-09-01 NOTE — H&P (Addendum)
[facility] History and Physical  Carolyn Valenzuela [id number] DOB: [birth year] DOA: 09/01/2014  Referring physician: Maryan Rued PCP: Cathlean Cower, MD   Chief Complaint: hypoglycemia, weakness, shortness of breath  HPI: Carolyn Valenzuela is a 64 y.o. female  With history of near end-stage renal disease, diabetes, hypertension, right BKA reports she felt like her blood sugars were low this morning. She did not check them. Her sister reports speaking to the patient on the phone and she had difficulty speaking.  No definite hypoglycemia reported by EMS. She was reportedly wheezing and received a nebulizer treatment en route. In the emergency room, her blood glucose was above 200. Chest x-ray showed pulmonary edema. Patient is followed by Dr. Moshe Cipro and has been trying to avoid dialysis. She lives alone. No chest pain or palpitations. Her sister reports that her face abdomen right breast, and left leg is swollen. She has been seen by nephrology who recommends starting dialysis but patient is not yet ready to consent. She has a nonfunctioning AV graft in her left arm.  In the emergency room, she was given 120 mg of IV Lasix.    Review of Systems:  As per history of present illness. Complete review of systems otherwise negative.  Past Medical History  Diagnosis Date  . DIABETES MELLITUS, UNCONTROLLED 05/21/2009  . HYPERLIPIDEMIA 03/30/2007  . ANEMIA-IRON DEFICIENCY 01/25/2008  . HYPERTENSION 01/25/2008  . SINUSITIS- ACUTE-NOS 01/25/2008  . SECONDARY HYPERPARATHYROIDISM 05/21/2009  . RENAL INSUFFICIENCY 02/01/2008  . CERVICAL RADICULOPATHY, LEFT 01/25/2008  . BACK PAIN 05/02/2009  . NUMBNESS 05/21/2009  . Allergic rhinitis, cause unspecified 12/08/2010  . Foot ulcer     right lateral malleolus  . Critical lower limb ischemia    Past Surgical History  Procedure Laterality Date  . Av fistula placement Left   . Eye surgery Bilateral     cataracts removed, left eye still has some oil in it.  .  Colonoscopy    . Amputation Right 03/15/2014    Procedure: RIGHT  LEG  BELOW KNEE AMPUTATION ;  Surgeon: [name], MD;  Location: [place];  Service: Orthopedics;  Laterality: Right;   Social History:  reports that she has never smoked. She has never used smokeless tobacco. She reports that she does not drink alcohol or use illicit drugs.  Allergies  Allergen Reactions  . Oxycodone Itching    Family History  Problem Relation Age of Onset  . Cancer Mother     Pancreatic     Prior to Admission medications   Medication Sig Start Date End Date Taking? Authorizing Provider  amLODipine (NORVASC) 10 MG tablet TAKE ONE TABLET BY MOUTH ONCE DAILY 07/31/14  Yes Biagio Borg, MD  aspirin 81 MG EC tablet Take 81 mg by mouth daily.     Yes Historical Provider, MD  ferrous sulfate (FERROUSUL) 325 (65 FE) MG tablet Take 325 mg by mouth daily with breakfast.   Yes Historical Provider, MD  furosemide (LASIX) 40 MG tablet Take 40-80 mg by mouth 2 (two) times daily. 80mg  in the am and 40mg  in the pm   Yes Historical Provider, MD  gabapentin (NEURONTIN) 100 MG capsule TAKE ONE CAPSULE BY MOUTH TWICE DAILY Patient taking differently: Take 100 mg by mouth 2 (two) times daily.  07/06/14  Yes Biagio Borg, MD  glimepiride (AMARYL) 4 MG tablet Take 1/2 tab by mouth per day Patient taking differently: Take 2 mg by mouth daily with breakfast.  04/12/14  Yes Biagio Borg,  MD  Multiple Vitamin (MULTIVITAMIN WITH MINERALS) TABS tablet Take 1 tablet by mouth daily.   Yes Historical Provider, MD  carvedilol (COREG) 12.5 MG tablet TAKE ONE TABLET BY MOUTH TWICE DAILY WITH A MEAL Patient not taking: Reported on 09/01/2014 05/30/14   Meredith Staggers, MD   Physical Exam: Filed Vitals:   09/01/14 1215 09/01/14 1311 09/01/14 1334 09/01/14 1345  BP: 179/95 178/78 155/74 177/71  Pulse: 94 85 88 88  Temp: 97.7 F (36.5 C)  97.1 F (36.2 C)   TempSrc: Oral  Rectal   Resp: 16 14 20 18   Height:      Weight:      SpO2: 100%  100% 100% 100%    Wt Readings from Last 3 Encounters:  09/01/14 52.164 kg (115 lb)  07/06/14 52.504 kg (115 lb 12 oz)  05/29/14 57.153 kg (126 lb)  BP 175/72 mmHg  Pulse 87  Temp(Src) 98.1 F (36.7 C) (Oral)  Resp 16  Ht 5' (1.524 m)  Wt 63.1 kg (139 lb 1.8 oz)  BMI 27.17 kg/m2  SpO2 100%  General Appearance:    Alert, cooperative, no distress, appears stated age  Head:    Normocephalic, without obvious abnormality, atraumatic  Eyes:    PERRL, conjunctiva/corneas clear, EOM's intact, periorbital edema and facial edema noted   Nose:   Nares normal, septum midline, mucosa normal, no drainage    or sinus tenderness  Throat:   Lips, mucosa, and tongue normal; teeth and gums normal  Neck:   Supple, symmetrical, trachea midline, no adenopathy;    thyroid:  no enlargement/tenderness/nodules; no carotid   bruit  Back:     Symmetric, no curvature, ROM normal, no CVA tenderness  Lungs:     rales bilateral bases   Chest Wall:    No tenderness or deformity   Heart:    RRR without MGR  Breast Exam:    Right breast with swelling  Abdomen:     Soft, non-tender, bowel sounds active, obese  Genitalia:    deferred  Rectal:    deferred  Extremities:   Right BKA stump with shrinker on. Left leg without swelling  Pulses:   2+ and symmetric all extremities  Skin:   Skin color, texture, turgor normal, no rashes or lesions  Lymph nodes:   Cervical, supraclavicular, and axillary nodes normal  Neurologic:   CNII-XII intact, normal strength, sensation and reflexes    throughout    Psych: normal affect        Labs on Admission:  Basic Metabolic Panel:  Recent Labs Lab 09/01/14 1047  NA 133*  K 4.8  CL 105  CO2 12*  GLUCOSE 305*  BUN 71*  CREATININE 9.20*  CALCIUM 7.4*   Liver Function Tests:  Recent Labs Lab 09/01/14 1047  AST 23  ALT 15  ALKPHOS 81  BILITOT 0.4  PROT 6.9  ALBUMIN 2.5*   No results for input(s): LIPASE, AMYLASE in the last 168 hours. No results for  input(s): AMMONIA in the last 168 hours. CBC:  Recent Labs Lab 09/01/14 1047  WBC 6.9  NEUTROABS 5.9  HGB 7.9*  HCT 23.7*  MCV 88.8  PLT 249   Cardiac Enzymes: No results for input(s): CKTOTAL, CKMB, CKMBINDEX, TROPONINI in the last 168 hours.  BNP (last 3 results) No results for input(s): PROBNP in the last 8760 hours. CBG: No results for input(s): GLUCAP in the last 168 hours.  Radiological Exams on Admission: Dg Chest 2 View  09/01/2014   CLINICAL DATA:  Cough, wheezing and shortness of breath. Foreign body projected over the left chest at the time of portable chest x-ray earlier today.  EXAM: CHEST - 2 VIEW  COMPARISON:  Portable film earlier today at 1057 hr  FINDINGS: The radiopaque hair pin is no longer visualized and presumably has been removed. No foreign body is identified in the chest. Lungs show persistent mild interstitial prominence with associated cardiomegaly and small bilateral pleural effusions. Findings are consistent with interstitial edema. Bony structures are unremarkable.  IMPRESSION: No further visualized hair pin overlying the mid left chest. Evidence of mild interstitial edema.   Electronically Signed   By: Aletta Edouard M.D.   On: 09/01/2014 12:56   Dg Chest Port 1 View  09/01/2014   CLINICAL DATA:  Pt is SOB and wheezing this morning. Blood sugar is low. Hx HTN and diabetes  EXAM: PORTABLE CHEST - 1 VIEW  COMPARISON:  03/15/2014  FINDINGS: New patchy interstitial and airspace opacities bilaterally involving bases more than apices. Heart size upper limits normal for technique. A hair pin projects over the left mainstem bronchus. No effusion. Visualized skeletal structures are unremarkable.  IMPRESSION: 1. New bibasilar interstitial and airspace infiltrates or edema. 2. Hair pin projects over the left mainstem bronchus. Correlate with physical exam to confirm this is external to the patient. Critical Value/emergent results were called by telephone at the time of  interpretation on 09/01/2014 at 11:25 am to Dr. Blanchie Dessert , who verbally acknowledged these results.   Electronically Signed   By: Arne Cleveland M.D.   On: 09/01/2014 11:25    EKG: tracing reviewed.  NSR  Assessment/Plan Principal Problem:   Pulmonary edema secondary to progressive renal failure: needs HD. Nephrology following. On high dose lasix. Admit to tele Active Problems:   Essential hypertension: resume home meds   End stage renal disease with fluid overload, acidosis, not yet on dialysis   S/P BKA (below knee amputation)   Type 2 diabetes mellitus with ESRD (end-stage renal disease): SSI for now   Anemia of renal disease: no transfusion due to fluid overload   Code Status: *full Family Communication: *sister at bedside Disposition Plan: admit to tele  Time spent: 60 min  [email or web address]

## 2014-09-01 NOTE — ED Notes (Signed)
Attempted report 

## 2014-09-02 DIAGNOSIS — N185 Chronic kidney disease, stage 5: Secondary | ICD-10-CM

## 2014-09-02 LAB — CBC
HCT: 21.6 % — ABNORMAL LOW (ref 36.0–46.0)
Hemoglobin: 7 g/dL — ABNORMAL LOW (ref 12.0–15.0)
MCH: 28.1 pg (ref 26.0–34.0)
MCHC: 32.4 g/dL (ref 30.0–36.0)
MCV: 86.7 fL (ref 78.0–100.0)
Platelets: 221 10*3/uL (ref 150–400)
RBC: 2.49 MIL/uL — ABNORMAL LOW (ref 3.87–5.11)
RDW: 14.7 % (ref 11.5–15.5)
WBC: 8.7 10*3/uL (ref 4.0–10.5)

## 2014-09-02 LAB — IRON AND TIBC
Iron: 99 ug/dL (ref 42–145)
Saturation Ratios: 63 % — ABNORMAL HIGH (ref 20–55)
TIBC: 156 ug/dL — ABNORMAL LOW (ref 250–470)
UIBC: 57 ug/dL — ABNORMAL LOW (ref 125–400)

## 2014-09-02 LAB — RENAL FUNCTION PANEL
Albumin: 2.1 g/dL — ABNORMAL LOW (ref 3.5–5.2)
Anion gap: 7 (ref 5–15)
BUN: 74 mg/dL — ABNORMAL HIGH (ref 6–23)
CO2: 18 mmol/L — ABNORMAL LOW (ref 19–32)
Calcium: 7.3 mg/dL — ABNORMAL LOW (ref 8.4–10.5)
Chloride: 108 mEq/L (ref 96–112)
Creatinine, Ser: 9.12 mg/dL — ABNORMAL HIGH (ref 0.50–1.10)
GFR calc Af Amer: 5 mL/min — ABNORMAL LOW (ref >90)
GFR calc non Af Amer: 4 mL/min — ABNORMAL LOW (ref >90)
Glucose, Bld: 37 mg/dL — CL (ref 70–99)
Phosphorus: 9.6 mg/dL — ABNORMAL HIGH (ref 2.3–4.6)
Potassium: 4.2 mmol/L (ref 3.5–5.1)
Sodium: 133 mmol/L — ABNORMAL LOW (ref 135–145)

## 2014-09-02 LAB — FOLATE: Folate: >20 ng/mL

## 2014-09-02 LAB — FERRITIN: Ferritin: 820 ng/mL — ABNORMAL HIGH (ref 10–291)

## 2014-09-02 LAB — GLUCOSE, CAPILLARY
Glucose-Capillary: 29 mg/dL — CL (ref 70–99)
Glucose-Capillary: 120 mg/dL — ABNORMAL HIGH (ref 70–99)
Glucose-Capillary: 180 mg/dL — ABNORMAL HIGH (ref 70–99)
Glucose-Capillary: 110 mg/dL — ABNORMAL HIGH (ref 70–99)
Glucose-Capillary: 90 mg/dL (ref 70–99)
Glucose-Capillary: 41 mg/dL — CL (ref 70–99)
Glucose-Capillary: 74 mg/dL (ref 70–99)
Glucose-Capillary: 47 mg/dL — ABNORMAL LOW (ref 70–99)
Glucose-Capillary: 185 mg/dL — ABNORMAL HIGH (ref 70–99)
Glucose-Capillary: 200 mg/dL — ABNORMAL HIGH (ref 70–99)

## 2014-09-02 LAB — PROTIME-INR
INR: 1.1 (ref 0.00–1.49)
Prothrombin Time: 14.3 seconds (ref 11.6–15.2)

## 2014-09-02 LAB — HEMOGLOBIN A1C
Hgb A1c MFr Bld: 8 % — ABNORMAL HIGH (ref <5.7)
Mean Plasma Glucose: 183 mg/dL — ABNORMAL HIGH (ref <117)

## 2014-09-02 LAB — CLOSTRIDIUM DIFFICILE BY PCR: Toxigenic C. Difficile by PCR: POSITIVE — AB

## 2014-09-02 LAB — RETICULOCYTES
RBC.: 2.42 MIL/uL — ABNORMAL LOW (ref 3.87–5.11)
Retic Count, Absolute: 26.6 10*3/uL (ref 19.0–186.0)
Retic Ct Pct: 1.1 % (ref 0.4–3.1)

## 2014-09-02 LAB — TSH: TSH: 3.811 u[IU]/mL (ref 0.350–4.500)

## 2014-09-02 LAB — VITAMIN B12: Vitamin B-12: 686 pg/mL (ref 211–911)

## 2014-09-02 LAB — APTT: aPTT: 32 seconds (ref 24–37)

## 2014-09-02 MED ORDER — DARBEPOETIN ALFA 150 MCG/0.3ML IJ SOSY
150.0000 ug | PREFILLED_SYRINGE | Freq: Once | INTRAMUSCULAR | Status: AC
  Administered 2014-09-02: 150 ug via SUBCUTANEOUS
  Filled 2014-09-02: qty 0.3

## 2014-09-02 MED ORDER — HYDRALAZINE HCL 20 MG/ML IJ SOLN
10.0000 mg | Freq: Four times a day (QID) | INTRAMUSCULAR | Status: DC | PRN
  Administered 2014-09-02 – 2014-09-09 (×4): 10 mg via INTRAVENOUS
  Filled 2014-09-02 (×5): qty 1

## 2014-09-02 MED ORDER — DEXTROSE 50 % IV SOLN
50.0000 mL | Freq: Once | INTRAVENOUS | Status: AC
  Administered 2014-09-02: 50 mL via INTRAVENOUS

## 2014-09-02 MED ORDER — DEXTROSE 5 % IV SOLN
INTRAVENOUS | Status: AC
  Administered 2014-09-02: 16:00:00 via INTRAVENOUS

## 2014-09-02 MED ORDER — METRONIDAZOLE 500 MG PO TABS
500.0000 mg | ORAL_TABLET | Freq: Three times a day (TID) | ORAL | Status: DC
  Administered 2014-09-02 – 2014-09-10 (×23): 500 mg via ORAL
  Filled 2014-09-02 (×27): qty 1

## 2014-09-02 MED ORDER — DEXTROSE 50 % IV SOLN
INTRAVENOUS | Status: AC
  Administered 2014-09-02: 50 mL
  Filled 2014-09-02: qty 50

## 2014-09-02 MED ORDER — DEXTROSE 50 % IV SOLN
INTRAVENOUS | Status: AC
Start: 2014-09-02 — End: 2014-09-02
  Filled 2014-09-02: qty 50

## 2014-09-02 NOTE — Progress Notes (Signed)
Hypoglycemic Event  CBG: 47  Treatment: D50 IV 50 mL, 15 gm snack  Symptoms: slurred speech  Follow-up CBG: Time:1633 CBG Result:185  Possible Reasons for Event: Medication regimen: Amaryl prior to admission per Dr. Candiss Norse  Comments/MD notified: Dr. Candiss Norse aware    Mechele Dawley  Remember to initiate Hypoglycemia Order Set & complete

## 2014-09-02 NOTE — Plan of Care (Signed)
Problem: Phase I Progression Outcomes Goal: OOB as tolerated unless otherwise ordered Outcome: Completed/Met Date Met:  09/02/14 Transferring from bed to [facility] and chair with one assist.

## 2014-09-02 NOTE — Progress Notes (Signed)
Admit: 09/01/2014 LOS: 1  10F with progressive CKD presenting with pulmonary edema / SOB   Subjective:  No events overnight Decent UOP SOB improved, on RA Long conversation agin this AM with pt about need to start RRT and if cont to delay will cause undesired negative health consequences.  She is fearful and wishes she could wait as long as possible but seems to come around to idea that time to start RRT is now.  Agrees to [facility] eval for [facility] and AVG with plan for HD initiation.     01/02 0701 - 01/03 0700 In: 858 [P.O.:665; I.V.:3; IV Piggyback:190] Out: 1450 [Urine:1450]  Filed Weights   09/01/14 1058 09/01/14 1440 09/02/14 0525  Weight: 52.164 kg (115 lb) 63.1 kg (139 lb 1.8 oz) 64.411 kg (142 lb)    Scheduled Meds: . amLODipine  10 mg Oral Daily  . antiseptic oral rinse  7 mL Mouth Rinse BID  . aspirin EC  81 mg Oral Daily  . calcium carbonate  400 mg of elemental calcium Oral TID WC  . cloNIDine  0.1 mg Oral BID  . ferrous sulfate  325 mg Oral Q breakfast  . furosemide  120 mg Intravenous 3 times per day  . gabapentin  100 mg Oral QHS  . heparin  5,000 Units Subcutaneous 3 times per day  . multivitamin with minerals  1 tablet Oral Daily  . sodium bicarbonate  650 mg Oral BID  . sodium chloride  3 mL Intravenous Q12H   Continuous Infusions:  PRN Meds:.acetaminophen **OR** acetaminophen, albuterol, ondansetron **OR** ondansetron (ZOFRAN) IV  Current Labs: reviewed    Physical Exam:  Blood pressure 145/66, pulse 79, temperature 97.9 F (36.6 C), temperature source Oral, resp. rate 18, height 5' (1.524 m), weight 64.411 kg (142 lb), SpO2 98 %. GEN: NAD, talks in full sentences ENT: NCAT, poor dentition NECK: no masses EYES: EOMI CV: RRR, no rub, nl s1s2 PULM: improved aeration, no LEE ABD: s/nt/nd SKIN: no rashes/lesions EXT:LLE with 1-2+ Pitting LEE  A/P 1. Progressive CKD with Volume Overload 1. Discussed that pt with vanishingly low GFR (<57mL/min given BKA) and  clear decline over past few months and best course is initiation RRT this hospitalization 2. She slowly agrees to moving forward, not sure if she will stay resolved to this 3. Have asked [facility] to eval for [facility]DC and AVG, appreciate help 4. She clearly has been told that further delay will be most certainly harmful for her health 2. HTN/Volume Overload / pulmonary edema /SOB; Stable 1. Largely as per #1 2. Cont Lasix at 120 IV TID 3. Na restriction 4. Daily weights, Daily Renal Panel, Strict I/Os 5. Cont CCB and BB, on NTG gtt currently 3. Anemia 1. On ESA as outpt 2. Would not transfuse at this time given Vol O/L -- once starts HD can transfuse 3. Fe replete as of 08/07/14 4. Follow for now 5. Restart ESA here 172mcg Aranesp 09/02/13 4. Metablic Acidosis 1. At least some contribution to SOB 2. Gently replace with NaHCO3 650 BID 3. Probably will be addressed with HD 5. 2HPTH / Hyperphosphatemia 1. Phos uncontrolled, start Tums qAC 2. PTH acceptable historically 6. DM2: per admitting team  Pearson Grippe MD 09/02/2014, 10:43 AM   Recent Labs Lab 09/01/14 1047 09/02/14 0330  NA 133* 133*  K 4.8 4.2  CL 105 108  CO2 12* 18*  GLUCOSE 305* 37*  BUN 71* 74*  CREATININE 9.20* 9.12*  CALCIUM 7.4* 7.3*  PHOS  --  9.6*    Recent Labs Lab 09/01/14 1047 09/02/14 0330  WBC 6.9 8.7  NEUTROABS 5.9  --   HGB 7.9* 7.0*  HCT 23.7* 21.6*  MCV 88.8 86.7  PLT 249 221

## 2014-09-02 NOTE — Progress Notes (Signed)
Lab called glucose level 37, rechecked CBG 29, gave 1 ampule of D50 rechecked CBG 180, MD notified,will continue to monitor, thanks Arvella Nigh RN

## 2014-09-02 NOTE — Progress Notes (Signed)
CRITICAL VALUE ALERT  Critical value received:  C difficile positive  Date of notification:  09/02/2014  Time of notification:  11:12  Critical value read back:Yes.    Nurse who received alert:  Levander Campion, RN  MD notified (1st page):  Dr. Candiss Norse  Time of first page:  11:15  MD notified (2nd page):  Time of second page:  Responding MD:  Dr. Candiss Norse  Time MD responded:  11:17

## 2014-09-02 NOTE — Progress Notes (Addendum)
Patient Demographics  Carolyn Valenzuela, is a 64 y.o. female, DOB - 22-Mar-1951, [id number]  Admit date - 09/01/2014   Admitting Physician Delfina Redwood, MD  Outpatient Primary MD for the patient is Cathlean Cower, MD  LOS - 1   Chief Complaint  Patient presents with  . Shortness of Breath        Subjective:   Carolyn Valenzuela today has, Carolyn headache, Carolyn chest pain, Carolyn abdominal pain - Carolyn Nausea, Carolyn new weakness tingling or numbness, Carolyn Cough - SOB.    Assessment & Plan    1. Pulmonary edema due to ESRD and refusal to undergo HD - on Lasix, SOB better, check Echo, discussed in detail with patient, requested palliative care to see, D/W Renal, she will decide on HD vs Comfort care soon.   2.AOCD - check anemia panel, Carolyn signs of acute bleed, type screen done, discussed with renal, will be transfused if agreeable to HD.   3. Hypoglycemia. History of type 2 diabetes mellitus. On Amaryl. Likely due to comminution of renal failure and Amaryl, CBGs every 6 hours with supportive care, if sugars persistently over 150 Will do sliding scale. Check A1c.  CBG (last 3)   Recent Labs  09/02/14 0500 09/02/14 0534 09/02/14 0715  GLUCAP 120* 110* 90     4.HTN - stable on clonidine, Norvasc and Lasix. As needed IV hydralazine.     5. C. difficile. Mild. Placed on Flagyl.     Code Status: Full  Family Communication: None  Disposition Plan: TBD   Procedures  TTE pending   Consults  Renal, Pall care   Medications  Scheduled Meds: . amLODipine  10 mg Oral Daily  . antiseptic oral rinse  7 mL Mouth Rinse BID  . aspirin EC  81 mg Oral Daily  . calcium carbonate  400 mg of elemental calcium Oral TID WC  . cloNIDine  0.1 mg Oral BID  . darbepoetin (ARANESP) injection - NON-DIALYSIS  150  mcg Subcutaneous Once  . ferrous sulfate  325 mg Oral Q breakfast  . furosemide  120 mg Intravenous 3 times per day  . gabapentin  100 mg Oral QHS  . heparin  5,000 Units Subcutaneous 3 times per day  . multivitamin with minerals  1 tablet Oral Daily  . sodium bicarbonate  650 mg Oral BID  . sodium chloride  3 mL Intravenous Q12H   Continuous Infusions:  PRN Meds:.acetaminophen **OR** acetaminophen, albuterol, ondansetron **OR** ondansetron (ZOFRAN) IV  DVT Prophylaxis    Heparin   Lab Results  Component Value Date   PLT 221 09/02/2014    Antibiotics     Anti-infectives    None          Objective:   Filed Vitals:   09/02/14 0000 09/02/14 0525 09/02/14 0929 09/02/14 1040  BP: 163/66 143/52 145/66   Pulse: 84 72 79   Temp: 98.3 F (36.8 C) 98.2 F (36.8 C) 97.9 F (36.6 C)   TempSrc: Oral Oral Oral   Resp: 18 18 18    Height:      Weight:  64.411 kg (142 lb)    SpO2: 95% 100% 96% 98%    Wt Readings from Last 3 Encounters:  09/02/14 64.411  kg (142 lb)  07/06/14 52.504 kg (115 lb 12 oz)  05/29/14 57.153 kg (126 lb)     Intake/Output Summary (Last 24 hours) at 09/02/14 1048 Last data filed at 09/02/14 0930  Gross per 24 hour  Intake   1098 ml  Output   1850 ml  Net   -752 ml     Physical Exam  Awake Alert, Oriented X 3, Carolyn new F.N deficits, Normal affect Carolyn Valenzuela,Carolyn Valenzuela,Carolyn Valenzuela, Carolyn cervical lymphadenopathy appriciated.  Symmetrical Chest wall movement, Good air movement bilaterally, CTAB RRR,Carolyn Gallops,Rubs or new Murmurs, Carolyn Parasternal Heave +ve B.Sounds, Abd Soft, Carolyn tenderness, Carolyn organomegaly appriciated, Carolyn rebound - guarding or rigidity. Carolyn Cyanosis, Clubbing , R BKA, trace  edema, Carolyn new Rash or bruise    Data Review   Micro Results Carolyn results found for this or any previous visit (from the past 240 hour(s)).  Radiology Reports Dg Chest 2 View  09/01/2014   CLINICAL DATA:  Cough, wheezing and shortness of breath. Foreign body  projected over the left chest at the time of portable chest x-ray earlier today.  EXAM: CHEST - 2 VIEW  COMPARISON:  Portable film earlier today at 1057 hr  FINDINGS: The radiopaque hair pin is Carolyn longer visualized and presumably has been removed. Carolyn foreign body is identified in the chest. Lungs show persistent mild interstitial prominence with associated cardiomegaly and small bilateral pleural effusions. Findings are consistent with interstitial edema. Bony structures are unremarkable.  IMPRESSION: Carolyn further visualized hair pin overlying the mid left chest. Evidence of mild interstitial edema.   Electronically Signed   By: Aletta Edouard M.D.   On: 09/01/2014 12:56   Dg Chest Port 1 View  09/01/2014   CLINICAL DATA:  Pt is SOB and wheezing this morning. Blood sugar is low. Hx HTN and diabetes  EXAM: PORTABLE CHEST - 1 VIEW  COMPARISON:  03/15/2014  FINDINGS: New patchy interstitial and airspace opacities bilaterally involving bases more than apices. Heart size upper limits normal for technique. A hair pin projects over the left mainstem bronchus. Carolyn effusion. Visualized skeletal structures are unremarkable.  IMPRESSION: 1. New bibasilar interstitial and airspace infiltrates or edema. 2. Hair pin projects over the left mainstem bronchus. Correlate with physical exam to confirm this is external to the patient. Critical Value/emergent results were called by telephone at the time of interpretation on 09/01/2014 at 11:25 am to Dr. Blanchie Dessert , who verbally acknowledged these results.   Electronically Signed   By: Arne Cleveland M.D.   On: 09/01/2014 11:25     CBC  Recent Labs Lab 09/01/14 1047 09/02/14 0330  WBC 6.9 8.7  HGB 7.9* 7.0*  HCT 23.7* 21.6*  PLT 249 221  MCV 88.8 86.7  MCH 29.6 28.1  MCHC 33.3 32.4  RDW 14.9 14.7  LYMPHSABS 0.8  --   MONOABS 0.2  --   EOSABS 0.0  --   BASOSABS 0.0  --     Chemistries   Recent Labs Lab 09/01/14 1047 09/02/14 0330  NA 133* 133*  K 4.8  4.2  CL 105 108  CO2 12* 18*  GLUCOSE 305* 37*  BUN 71* 74*  CREATININE 9.20* 9.12*  CALCIUM 7.4* 7.3*  AST 23  --   ALT 15  --   ALKPHOS 81  --   BILITOT 0.4  --    ------------------------------------------------------------------------------------------------------------------ estimated creatinine clearance is 5.4 mL/min (by C-G formula based on Cr of 9.12). ------------------------------------------------------------------------------------------------------------------ Carolyn results for input(s):  HGBA1C in the last 72 hours. ------------------------------------------------------------------------------------------------------------------ Carolyn results for input(s): CHOL, HDL, LDLCALC, TRIG, CHOLHDL, LDLDIRECT in the last 72 hours. ------------------------------------------------------------------------------------------------------------------ Carolyn results for input(s): TSH, T4TOTAL, T3FREE, THYROIDAB in the last 72 hours.  Invalid input(s): FREET3 ------------------------------------------------------------------------------------------------------------------ Carolyn results for input(s): VITAMINB12, FOLATE, FERRITIN, TIBC, IRON, RETICCTPCT in the last 72 hours.  Coagulation profile  Recent Labs Lab 09/02/14 0330  INR 1.10    Carolyn results for input(s): DDIMER in the last 72 hours.  Cardiac Enzymes Carolyn results for input(s): CKMB, TROPONINI, MYOGLOBIN in the last 168 hours.  Invalid input(s): CK ------------------------------------------------------------------------------------------------------------------ Invalid input(s): POCBNP     Time Spent in minutes   35   Shamanda Len K M.D on 09/02/2014 at 10:48 AM  Between 7am to 7pm - Pager - [phone]  After 7pm go to [email or web address] - [facility] Office  [phone]

## 2014-09-02 NOTE — Progress Notes (Signed)
CBG 120, gave 8 oz gingerale, pack graham crackers, will continue to monitor, thanks, Buckner [place].

## 2014-09-02 NOTE — Progress Notes (Signed)
Hypoglycemic Event  CBG: 41  Treatment: 15 GM carbohydrate snack  Symptoms: Shaky  Follow-up CBG: Time:1228 CBG Result:74  Possible Reasons for Event: Unknown  Comments/MD notified: Dr. Candiss Norse aware.    Levander Campion C  Remember to initiate Hypoglycemia Order Set & complete

## 2014-09-02 NOTE — Consult Note (Signed)
[facility] [place] HISTORY AND PHYSICAL  Reason for consult: needs hemodialysis access Requesting: Pearson Grippe MD History of Present Illness:  Patient is a 64 y.o. year old female who presents for evaluation for permanent hemodialysis access.  Pt had left radial cephalic fistula placed by my partner Dr Scot Dock 2011.  This has been chronically occluded and pt never consented to a new access.  She has very poor insight into her current situation as far as her kidney function is concerned.  She is right handed.  Other medical problems include diabetes, hyperlipid, hypertension all of which are currently stable.  Past Medical History  Diagnosis Date  . DIABETES MELLITUS, UNCONTROLLED 05/21/2009  . HYPERLIPIDEMIA 03/30/2007  . ANEMIA-IRON DEFICIENCY 01/25/2008  . HYPERTENSION 01/25/2008  . SINUSITIS- ACUTE-NOS 01/25/2008  . SECONDARY HYPERPARATHYROIDISM 05/21/2009  . RENAL INSUFFICIENCY 02/01/2008  . CERVICAL RADICULOPATHY, LEFT 01/25/2008  . BACK PAIN 05/02/2009  . NUMBNESS 05/21/2009  . Allergic rhinitis, cause unspecified 12/08/2010  . Foot ulcer     right lateral malleolus  . Critical lower limb ischemia     Past Surgical History  Procedure Laterality Date  . Av fistula placement Left   . Eye surgery Bilateral     cataracts removed, left eye still has some oil in it.  . Colonoscopy    . Amputation Right 03/15/2014    Procedure: RIGHT  LEG  BELOW KNEE AMPUTATION ;  Surgeon: [name], MD;  Location: [facility];  Service: Orthopedics;  Laterality: Right;    Social History History  Substance Use Topics  . Smoking status: Never Smoker   . Smokeless tobacco: Never Used  . Alcohol Use: No    Family History Family History  Problem Relation Age of Onset  . Cancer Mother     Pancreatic    Allergies  Allergies  Allergen Reactions  . Oxycodone Itching     Current Facility-Administered Medications  Medication Dose Route Frequency Provider Last Rate Last Dose  .  acetaminophen (TYLENOL) tablet 650 mg  650 mg Oral Q6H PRN Delfina Redwood, MD   650 mg at 09/01/14 2348   Or  . acetaminophen (TYLENOL) suppository 650 mg  650 mg Rectal Q6H PRN Delfina Redwood, MD      . albuterol (PROVENTIL) (2.5 MG/3ML) 0.083% nebulizer solution 2.5 mg  2.5 mg Nebulization Q2H PRN Delfina Redwood, MD      . amLODipine (NORVASC) tablet 10 mg  10 mg Oral Daily Delfina Redwood, MD   10 mg at 09/02/14 1040  . antiseptic oral rinse (CPC / CETYLPYRIDINIUM CHLORIDE 0.05%) solution 7 mL  7 mL Mouth Rinse BID Delfina Redwood, MD   7 mL at 09/01/14 2130  . aspirin EC tablet 81 mg  81 mg Oral Daily Delfina Redwood, MD   81 mg at 09/02/14 1040  . calcium carbonate (TUMS - dosed in mg elemental calcium) chewable tablet 400 mg of elemental calcium  400 mg of elemental calcium Oral TID WC Rexene Agent, MD   400 mg of elemental calcium at 09/02/14 1635  . cloNIDine (CATAPRES) tablet 0.1 mg  0.1 mg Oral BID Delfina Redwood, MD   0.1 mg at 09/02/14 1041  . dextrose 5 % solution   Intravenous Continuous Thurnell Lose, MD 35 mL/hr at 09/02/14 1609    . ferrous sulfate tablet 325 mg  325 mg Oral Q breakfast Delfina Redwood, MD   325 mg at 09/02/14 1041  .  furosemide (LASIX) 120 mg in dextrose 5 % 50 mL IVPB  120 mg Intravenous 3 times per day Rexene Agent, MD   120 mg at 09/02/14 1637  . gabapentin (NEURONTIN) capsule 100 mg  100 mg Oral QHS Delfina Redwood, MD   100 mg at 09/01/14 2114  . heparin injection 5,000 Units  5,000 Units Subcutaneous 3 times per day Delfina Redwood, MD   5,000 Units at 09/02/14 1610  . hydrALAZINE (APRESOLINE) injection 10 mg  10 mg Intravenous Q6H PRN Thurnell Lose, MD      . metroNIDAZOLE (FLAGYL) tablet 500 mg  500 mg Oral 3 times per day Thurnell Lose, MD   500 mg at 09/02/14 1609  . multivitamin with minerals tablet 1 tablet  1 tablet Oral Daily Delfina Redwood, MD   1 tablet at 09/02/14 1041  . ondansetron (ZOFRAN)  tablet 4 mg  4 mg Oral Q6H PRN Delfina Redwood, MD       Or  . ondansetron [facility]) injection 4 mg  4 mg Intravenous Q6H PRN Delfina Redwood, MD      . sodium bicarbonate tablet 650 mg  650 mg Oral BID Rexene Agent, MD   650 mg at 09/02/14 1041  . sodium chloride 0.9 % injection 3 mL  3 mL Intravenous Q12H Delfina Redwood, MD   3 mL at 09/02/14 1041    ROS:   General:  No weight loss, Fever, chills  HEENT: No recent headaches, no nasal bleeding, no visual changes, no sore throat  Neurologic: No dizziness, blackouts, seizures. No recent symptoms of stroke or mini- stroke. No recent episodes of slurred speech, or temporary blindness.  Cardiac: No recent episodes of chest pain/pressure, no shortness of breath at rest.  + shortness of breath with exertion.  Denies history of atrial fibrillation or irregular heartbeat  Vascular: No history of rest pain in feet.  No history of claudication.  + history of non-healing ulcer had Right BKA by Dr Doran Durand 5-6 mo ago No history of DVT   Pulmonary: No home oxygen, no productive cough, no hemoptysis,  No asthma or wheezing  Musculoskeletal:  [x ] Arthritis, [x ] Low back pain,  [ ]  Joint pain  Hematologic:No history of hypercoagulable state.  No history of easy bleeding.  No history of anemia  Gastrointestinal: No hematochezia or melena,  No gastroesophageal reflux, no trouble swallowing  Urinary: [x ] chronic Kidney disease, [ ]  on HD - [ ]  MWF or [ ]  TTHS, [ ]  Burning with urination, [ ]  Frequent urination, [ ]  Difficulty urinating;   Skin: No rashes  Psychological: No history of anxiety,  No history of depression   Physical Examination  Filed Vitals:   09/02/14 0000 09/02/14 0525 09/02/14 0929 09/02/14 1040  BP: 163/66 143/52 145/66   Pulse: 84 72 79   Temp: 98.3 F (36.8 C) 98.2 F (36.8 C) 97.9 F (36.6 C)   TempSrc: Oral Oral Oral   Resp: 18 18 18    Height:      Weight:  142 lb (64.411 kg)    SpO2: 95% 100% 96% 98%     Body mass index is 27.73 kg/(m^2).  General:  Alert and oriented, no acute distress HEENT: Normal Neck: No JVD no obvious central line scars Pulmonary: Clear to auscultation bilaterally Cardiac: Regular Rate and Rhythm  Abdomen: Soft, non-tender, non-distended Skin: No rash Extremity Pulses:  2+ radial, brachial, pulses bilaterally Neurologic: Upper  extremity motor 5/5 and symmetric  DATA:   CBC    Component Value Date/Time   WBC 8.7 09/02/2014 0330   RBC 2.42* 09/02/2014 1140   RBC 2.49* 09/02/2014 0330   HGB 7.0* 09/02/2014 0330   HCT 21.6* 09/02/2014 0330   PLT 221 09/02/2014 0330   MCV 86.7 09/02/2014 0330   MCH 28.1 09/02/2014 0330   MCHC 32.4 09/02/2014 0330   RDW 14.7 09/02/2014 0330   LYMPHSABS 0.8 09/01/2014 1047   MONOABS 0.2 09/01/2014 1047   EOSABS 0.0 09/01/2014 1047   BASOSABS 0.0 09/01/2014 1047     BMET    Component Value Date/Time   NA 133* 09/02/2014 0330   K 4.2 09/02/2014 0330   CL 108 09/02/2014 0330   CO2 18* 09/02/2014 0330   GLUCOSE 37* 09/02/2014 0330   BUN 74* 09/02/2014 0330   CREATININE 9.12* 09/02/2014 0330   CALCIUM 7.3* 09/02/2014 0330   CALCIUM 7.2* 08/07/2014 1406   GFRNONAA 4* 09/02/2014 0330   GFRAA 5* 09/02/2014 0330       ASSESSMENT:  Needs long term hemodialysis access CKD 5   PLAN:  Diatek catheter tomorrow by my partner Dr Bridgett Larsson.  Risks benefits and procedure discussed. Will get vein mapping this admission and try to place permanent access prior to d/c  NPO p midnight, consent CBC BMET am  Ruta Hinds, MD [facility] [place]: [phone] Pager: [phone]

## 2014-09-02 NOTE — Evaluation (Signed)
Physical Therapy Evaluation Patient Details Name: Carolyn Valenzuela MRN: [id number] DOB: 13-Jan-1951 Today's Date: 09/02/2014   History of Present Illness  Patient is a 64 yo female with progressive CKD presenting with pulmonary edema / SOB    Clinical Impression  Patient performing transfers independently during evaluation, no physical assist required. Patient states she is at her baseline for mobility as consistent with pre-admission mobility.  On 12/23 patient was seen at outpatient PT for prosthesis training and was ambulating with prosthesis >43f with cane. At this time patient demonstrates no acute PT needs, recommend resuming outpatient PT services upon acute discharge. Acute PT will sign off.    Follow Up Recommendations Outpatient PT (resume outpatient PT services for prosthetics)    Equipment Recommendations  None recommended by PT    Recommendations for Other Services       Precautions / Restrictions Restrictions Weight Bearing Restrictions: No      Mobility  Bed Mobility Overal bed mobility: Modified Independent             General bed mobility comments: increased time, no physical assist  Transfers Overall transfer level: Modified independent Equipment used: None             General transfer comment: patient transferred from bed to commode without any cues or physical assist.  Ambulation/Gait         No prosthesis here at this hospital therefore no ambulation performed this date, patient states no concerns regarding ambulation.      Stairs            Wheelchair Mobility    Modified Rankin (Stroke Patients Only)       Balance                                             Pertinent Vitals/Pain Pain Assessment: No/denies pain    Home Living Family/patient expects to be discharged to:: Private residence Living Arrangements: Alone Available Help at Discharge: Family;Available PRN/intermittently Type of Home:  House Home Access: Stairs to enter;Ramped entrance Entrance Stairs-Rails: None Entrance Stairs-Number of Steps: 2 Home Layout: One level Home Equipment: Walker - 2 wheels;Cane - single point;Wheelchair - manual      Prior Function Level of Independence: Independent         Comments: as of 12/23 patient was using cane with prosthesis and carrying cups of water while ambulating increased distances of 400+ feet per outpatient PT notes     Hand Dominance   Dominant Hand: Right    Extremity/Trunk Assessment   Upper Extremity Assessment: Overall WFL for tasks assessed           Lower Extremity Assessment: Generalized weakness;RLE deficits/detail RLE Deficits / Details: Right BKA       Communication   Communication: No difficulties  Cognition Arousal/Alertness: Awake/alert Behavior During Therapy: WFL for tasks assessed/performed Overall Cognitive Status: Within Functional Limits for tasks assessed                      General Comments      Exercises        Assessment/Plan    PT Assessment All further PT needs can be met in the next venue of care (resume outpatient PT services for prosthetics)  PT Diagnosis     PT Problem List    PT Treatment Interventions  PT Goals (Current goals can be found in the Care Plan section) Acute Rehab PT Goals PT Goal Formulation: All assessment and education complete, DC therapy (resume outpatient PT)    Frequency     Barriers to discharge        Co-evaluation               End of Session Equipment Utilized During Treatment: Gait belt Activity Tolerance: Patient tolerated treatment well Patient left: in bed;with call bell/phone within reach Nurse Communication: Mobility status         Time: 8441-7127 PT Time Calculation (min) (ACUTE ONLY): 15 min   Charges:   PT Evaluation $Initial PT Evaluation Tier I: 1 Procedure PT Treatments $Therapeutic Activity: 8-22 mins   PT G CodesDuncan Dull 09/02/2014, 2:50 PM  Alben Deeds, [place] DPT  [phone]

## 2014-09-03 ENCOUNTER — Inpatient Hospital Stay (HOSPITAL_COMMUNITY): Payer: BC Managed Care – PPO | Admitting: Anesthesiology

## 2014-09-03 ENCOUNTER — Encounter (HOSPITAL_COMMUNITY)
Admission: EM | Disposition: A | Discharge status: Home Health | Payer: Self-pay | Source: Home / Self Care | Attending: Internal Medicine

## 2014-09-03 ENCOUNTER — Encounter (HOSPITAL_COMMUNITY): Payer: Self-pay | Admitting: Certified Registered Nurse Anesthetist

## 2014-09-03 ENCOUNTER — Inpatient Hospital Stay (HOSPITAL_COMMUNITY): Payer: BC Managed Care – PPO

## 2014-09-03 HISTORY — PX: INSERTION OF DIALYSIS CATHETER: SHX1324

## 2014-09-03 LAB — GLUCOSE, CAPILLARY
Glucose-Capillary: 135 mg/dL — ABNORMAL HIGH (ref 70–99)
Glucose-Capillary: 180 mg/dL — ABNORMAL HIGH (ref 70–99)
Glucose-Capillary: 92 mg/dL (ref 70–99)
Glucose-Capillary: 120 mg/dL — ABNORMAL HIGH (ref 70–99)
Glucose-Capillary: 114 mg/dL — ABNORMAL HIGH (ref 70–99)
Glucose-Capillary: 151 mg/dL — ABNORMAL HIGH (ref 70–99)
Glucose-Capillary: 99 mg/dL (ref 70–99)

## 2014-09-03 LAB — RENAL FUNCTION PANEL
Albumin: 2 g/dL — ABNORMAL LOW (ref 3.5–5.2)
Anion gap: 12 (ref 5–15)
BUN: 72 mg/dL — ABNORMAL HIGH (ref 6–23)
CO2: 15 mmol/L — ABNORMAL LOW (ref 19–32)
Calcium: 7.3 mg/dL — ABNORMAL LOW (ref 8.4–10.5)
Chloride: 102 mEq/L (ref 96–112)
Creatinine, Ser: 9.13 mg/dL — ABNORMAL HIGH (ref 0.50–1.10)
GFR calc Af Amer: 5 mL/min — ABNORMAL LOW (ref >90)
GFR calc non Af Amer: 4 mL/min — ABNORMAL LOW (ref >90)
Glucose, Bld: 120 mg/dL — ABNORMAL HIGH (ref 70–99)
Phosphorus: 8.1 mg/dL — ABNORMAL HIGH (ref 2.3–4.6)
Potassium: 4.3 mmol/L (ref 3.5–5.1)
Sodium: 129 mmol/L — ABNORMAL LOW (ref 135–145)

## 2014-09-03 LAB — CBC
HCT: 19.6 % — ABNORMAL LOW (ref 36.0–46.0)
Hemoglobin: 6.5 g/dL — CL (ref 12.0–15.0)
MCH: 28.3 pg (ref 26.0–34.0)
MCHC: 33.2 g/dL (ref 30.0–36.0)
MCV: 85.2 fL (ref 78.0–100.0)
Platelets: 204 10*3/uL (ref 150–400)
RBC: 2.3 MIL/uL — ABNORMAL LOW (ref 3.87–5.11)
RDW: 14.6 % (ref 11.5–15.5)
WBC: 6.7 10*3/uL (ref 4.0–10.5)

## 2014-09-03 LAB — HEPATITIS B SURFACE ANTIGEN: Hepatitis B Surface Ag: NEGATIVE

## 2014-09-03 LAB — PREPARE RBC (CROSSMATCH)

## 2014-09-03 LAB — HEPATITIS B CORE ANTIBODY, TOTAL: Hep B Core Total Ab: REACTIVE — AB

## 2014-09-03 LAB — HEPATITIS B SURFACE ANTIBODY,QUALITATIVE: Hep B S Ab: NEGATIVE

## 2014-09-03 SURGERY — INSERTION OF DIALYSIS CATHETER
Anesthesia: Monitor Anesthesia Care | Site: Neck

## 2014-09-03 MED ORDER — ROCURONIUM BROMIDE 50 MG/5ML IV SOLN
INTRAVENOUS | Status: AC
  Filled 2014-09-03: qty 1

## 2014-09-03 MED ORDER — MIDAZOLAM HCL 2 MG/2ML IJ SOLN
INTRAMUSCULAR | Status: AC
  Filled 2014-09-03: qty 2

## 2014-09-03 MED ORDER — FENTANYL CITRATE 0.05 MG/ML IJ SOLN
INTRAMUSCULAR | Status: AC
  Filled 2014-09-03: qty 5

## 2014-09-03 MED ORDER — FENTANYL CITRATE 0.05 MG/ML IJ SOLN: 25.0000 ug | INTRAMUSCULAR | Status: DC | PRN

## 2014-09-03 MED ORDER — MIDAZOLAM HCL 5 MG/5ML IJ SOLN
INTRAMUSCULAR | Status: DC | PRN
  Administered 2014-09-03: 1 mg via INTRAVENOUS

## 2014-09-03 MED ORDER — HEPARIN SODIUM (PORCINE) 1000 UNIT/ML IJ SOLN
INTRAMUSCULAR | Status: DC | PRN
  Administered 2014-09-03: 4.6 mL

## 2014-09-03 MED ORDER — LIDOCAINE HCL (PF) 1 % IJ SOLN: 5.0000 mL | INTRAMUSCULAR | Status: DC | PRN

## 2014-09-03 MED ORDER — CEFAZOLIN SODIUM 1-5 GM-% IV SOLN
INTRAVENOUS | Status: DC | PRN
  Administered 2014-09-03: 1 g via INTRAVENOUS

## 2014-09-03 MED ORDER — SODIUM CHLORIDE 0.9 % IV SOLN
Freq: Once | INTRAVENOUS | Status: AC
  Administered 2014-09-06: 11:00:00 via INTRAVENOUS

## 2014-09-03 MED ORDER — LIDOCAINE-EPINEPHRINE (PF) 1 %-1:200000 IJ SOLN
INTRAMUSCULAR | Status: AC
  Filled 2014-09-03: qty 10

## 2014-09-03 MED ORDER — HEPARIN SODIUM (PORCINE) 1000 UNIT/ML DIALYSIS: 40.0000 [IU]/kg | INTRAMUSCULAR | Status: DC | PRN

## 2014-09-03 MED ORDER — LIDOCAINE-EPINEPHRINE (PF) 1 %-1:200000 IJ SOLN
INTRAMUSCULAR | Status: DC | PRN
  Administered 2014-09-03: 20 mL

## 2014-09-03 MED ORDER — ALTEPLASE 2 MG IJ SOLR
2.0000 mg | Freq: Once | INTRAMUSCULAR | Status: DC | PRN
  Filled 2014-09-03: qty 2

## 2014-09-03 MED ORDER — LIDOCAINE-PRILOCAINE 2.5-2.5 % EX CREA
1.0000 "application " | TOPICAL_CREAM | CUTANEOUS | Status: DC | PRN
  Filled 2014-09-03: qty 5

## 2014-09-03 MED ORDER — CETYLPYRIDINIUM CHLORIDE 0.05 % MT LIQD: 7.0000 mL | Freq: Two times a day (BID) | OROMUCOSAL | Status: DC

## 2014-09-03 MED ORDER — PENTAFLUOROPROP-TETRAFLUOROETH EX AERO: 1.0000 "application " | INHALATION_SPRAY | CUTANEOUS | Status: DC | PRN

## 2014-09-03 MED ORDER — FENTANYL CITRATE 0.05 MG/ML IJ SOLN
INTRAMUSCULAR | Status: DC | PRN
  Administered 2014-09-03 (×2): 25 ug via INTRAVENOUS

## 2014-09-03 MED ORDER — HEPARIN SODIUM (PORCINE) 1000 UNIT/ML DIALYSIS: 1000.0000 [IU] | INTRAMUSCULAR | Status: DC | PRN

## 2014-09-03 MED ORDER — SODIUM CHLORIDE 0.9 % IR SOLN
Status: DC | PRN
  Administered 2014-09-03: 500 mL

## 2014-09-03 MED ORDER — LIDOCAINE HCL (CARDIAC) 20 MG/ML IV SOLN
INTRAVENOUS | Status: AC
  Filled 2014-09-03: qty 5

## 2014-09-03 MED ORDER — PROPOFOL 10 MG/ML IV BOLUS
INTRAVENOUS | Status: AC
  Filled 2014-09-03: qty 20

## 2014-09-03 MED ORDER — HEPARIN SODIUM (PORCINE) 1000 UNIT/ML IJ SOLN
INTRAMUSCULAR | Status: AC
  Filled 2014-09-03: qty 1

## 2014-09-03 MED ORDER — SODIUM CHLORIDE 0.9 % IV SOLN
INTRAVENOUS | Status: DC | PRN
  Administered 2014-09-03: 08:00:00 via INTRAVENOUS

## 2014-09-03 MED ORDER — SODIUM CHLORIDE 0.9 % IV SOLN: 100.0000 mL | INTRAVENOUS | Status: DC | PRN

## 2014-09-03 MED ORDER — PROPOFOL INFUSION 10 MG/ML OPTIME
INTRAVENOUS | Status: DC | PRN
  Administered 2014-09-03: 75 ug/kg/min via INTRAVENOUS

## 2014-09-03 MED ORDER — ONDANSETRON HCL 4 MG/2ML IJ SOLN: 4.0000 mg | Freq: Once | INTRAMUSCULAR | Status: DC | PRN

## 2014-09-03 MED ORDER — ONDANSETRON HCL 4 MG/2ML IJ SOLN
INTRAMUSCULAR | Status: AC
  Filled 2014-09-03: qty 2

## 2014-09-03 MED ORDER — ONDANSETRON HCL 4 MG/2ML IJ SOLN
INTRAMUSCULAR | Status: DC | PRN
  Administered 2014-09-03: 4 mg via INTRAVENOUS

## 2014-09-03 MED ORDER — NEPRO/CARBSTEADY PO LIQD
237.0000 mL | ORAL | Status: DC | PRN
  Filled 2014-09-03: qty 237

## 2014-09-03 SURGICAL SUPPLY — 44 items
BAG BANDED W/RUBBER/TAPE 36X54 (MISCELLANEOUS) ×2 IMPLANT
BAG DECANTER FOR FLEXI CONT (MISCELLANEOUS) ×2 IMPLANT
BAG EQP BAND 135X91 W/RBR TAPE (MISCELLANEOUS) ×1
CATH CANNON HEMO 15F 50CM (CATHETERS) IMPLANT
CATH CANNON HEMO 15FR 19 (HEMODIALYSIS SUPPLIES) IMPLANT
CATH CANNON HEMO 15FR 23CM (HEMODIALYSIS SUPPLIES) ×2 IMPLANT
CATH CANNON HEMO 15FR 31CM (HEMODIALYSIS SUPPLIES) IMPLANT
CATH CANNON HEMO 15FR 32CM (HEMODIALYSIS SUPPLIES) IMPLANT
CATH STRAIGHT 5FR 65CM (CATHETERS) IMPLANT
COVER PROBE W GEL 5X96 (DRAPES) ×2 IMPLANT
COVER SURGICAL LIGHT HANDLE (MISCELLANEOUS) ×2 IMPLANT
DRAPE C-ARM 42X72 X-RAY (DRAPES) ×2 IMPLANT
DRAPE CHEST BREAST 15X10 FENES (DRAPES) ×2 IMPLANT
GAUZE SPONGE 2X2 8PLY STRL LF (GAUZE/BANDAGES/DRESSINGS) ×1 IMPLANT
GAUZE SPONGE 4X4 16PLY XRAY LF (GAUZE/BANDAGES/DRESSINGS) ×2 IMPLANT
GLOVE BIO SURGEON STRL SZ7 (GLOVE) ×2 IMPLANT
GLOVE BIOGEL PI IND STRL 6.5 (GLOVE) ×1 IMPLANT
GLOVE BIOGEL PI IND STRL 7.5 (GLOVE) ×1 IMPLANT
GLOVE BIOGEL PI INDICATOR 6.5 (GLOVE) ×1
GLOVE BIOGEL PI INDICATOR 7.5 (GLOVE) ×1
GLOVE SURG SS PI 7.0 STRL IVOR (GLOVE) ×2 IMPLANT
GOWN STRL REUS W/ TWL LRG LVL3 (GOWN DISPOSABLE) ×2 IMPLANT
GOWN STRL REUS W/TWL LRG LVL3 (GOWN DISPOSABLE) ×4
KIT BASIN OR (CUSTOM PROCEDURE TRAY) ×2 IMPLANT
KIT ROOM TURNOVER OR (KITS) ×2 IMPLANT
LIQUID BAND (GAUZE/BANDAGES/DRESSINGS) ×2 IMPLANT
NEEDLE 18GX1X1/2 (RX/OR ONLY) (NEEDLE) ×2 IMPLANT
NEEDLE HYPO 25GX1X1/2 BEV (NEEDLE) ×2 IMPLANT
NS IRRIG 1000ML POUR BTL (IV SOLUTION) ×2 IMPLANT
PACK SURGICAL SETUP 50X90 (CUSTOM PROCEDURE TRAY) ×2 IMPLANT
PAD ARMBOARD 7.5X6 YLW CONV (MISCELLANEOUS) ×4 IMPLANT
SET MICROPUNCTURE 5F STIFF (MISCELLANEOUS) IMPLANT
SOAP 2 % CHG 4 OZ (WOUND CARE) ×2 IMPLANT
SPONGE GAUZE 2X2 STER 10/PKG (GAUZE/BANDAGES/DRESSINGS) ×1
SUT ETHILON 3 0 PS 1 (SUTURE) ×2 IMPLANT
SUT MNCRL AB 4-0 PS2 18 (SUTURE) ×2 IMPLANT
SYR 20CC LL (SYRINGE) ×4 IMPLANT
SYR 3ML LL SCALE MARK (SYRINGE) ×2 IMPLANT
SYR 5ML LL (SYRINGE) ×2 IMPLANT
SYR CONTROL 10ML LL (SYRINGE) ×2 IMPLANT
SYRINGE 10CC LL (SYRINGE) ×2 IMPLANT
TAPE CLOTH SURG 4X10 WHT LF (GAUZE/BANDAGES/DRESSINGS) ×2 IMPLANT
WATER STERILE IRR 1000ML POUR (IV SOLUTION) ×2 IMPLANT
WIRE AMPLATZ SS-J .035X180CM (WIRE) IMPLANT

## 2014-09-03 NOTE — Op Note (Signed)
    OPERATIVE NOTE  PROCEDURE: 1. Right internal jugular vein tunneled dialysis catheter placement 2. Right internal jugular vein cannulation under ultrasound guidance  PRE-OPERATIVE DIAGNOSIS: end-stage renal failure  POST-OPERATIVE DIAGNOSIS: same as above  SURGEON: Adele Barthel, MD  ANESTHESIA: local and MAC  ESTIMATED BLOOD LOSS: 30 cc  FINDING(S): 1.  Tips of the catheter in the right atrium on fluoroscopy 2.  No obvious pneumothorax on fluoroscopy  SPECIMEN(S):  none  INDICATIONS:   Carolyn Valenzuela is a 64 y.o. female who presents with end stage renal disease.  The patient presents for tunneled dialysis catheter placement.  The patient is aware the risks of tunneled dialysis catheter placement include but are not limited to: bleeding, infection, central venous injury, pneumothorax, possible venous stenosis, possible malpositioning in the venous system, and possible infections related to long-term catheter presence.  The patient was aware of these risks and agreed to proceed.  DESCRIPTION: After written full informed consent was obtained from the patient, the patient was taken back to the operating room.  Prior to induction, the patient was given IV antibiotics.  After obtaining adequate sedation, the patient was prepped and draped in the standard fashion for a chest or neck tunneled dialysis catheter placement.  I anesthesized the neck cannulation site with local anesthetic.  Under ultrasound guidance, the right internal jugular vein was cannulated with the 18 gauge needle.  A J-wire was then placed down in the inferior vena cava under fluroscopic guidance.  The wire was then secured in place with a clamp to the drapes.  The cannulation site, the catheter exit site, and tract for the subcutaneous tunnel were then anesthesized with a total of 20 cc of 1% Lidocaine with epinepherine.  I then made stab incisions at the neck and exit sites.   I dissected from the exit site to the  cannulation site with a metal tunneler.   The subcutaneous tunnel was dilated by passing a plastic dilator over the metal dissector. The wire was then unclamped and I removed the needle.  The skin tract and venotomy was dilated serially with dilators.  Finally, the dilator-sheath was placed under fluroscopic guidance into the superior vena cava.  The dilator and wire were removed.  A 23 cm Diatek catheter was placed under fluoroscopic guidance down into the right atrium.  The sheath was broken and peeled away while holding the catheter cuff at the level of the skin.  The back end of this catheter was transected, revealing the two lumens of this catheter.  The ports were docked onto these two lumens.  The catheter hub was then screwed into place.  Each port was tested by aspirating and flushing.  No resistance was noted.  Each port was then thoroughly flushed with heparinized saline.  The catheter was secured in placed with two interrupted stitches of 3-0 Nylon tied to the catheter.  The neck incision was closed with a U-stitch of 4-0 Monocryl.  The neck and chest incision were cleaned and sterile bandages applied.  Each port was then loaded with concentrated heparin (1000 Units/mL) at the manufacturer recommended volumes to each port.  Sterile caps were applied to each port.  On completion fluoroscopy, the tips of the catheter were in the right atrium, and there was no evidence of pneumothorax.  COMPLICATIONS: none  CONDITION: stable  Adele Barthel, MD [facility] [place] Office: [phone] Pager: [phone]  09/03/2014, 8:11 AM

## 2014-09-03 NOTE — Interval H&P Note (Signed)
[facility] [place]  History and Physical Update  The patient was interviewed and re-examined.  The patient's previous History and Physical has been reviewed and is unchanged from Dr. Nona Dell consult.  There is no change in the plan of care: tunneled dialysis catheter placement.  The patient is aware the risks of tunneled dialysis catheter placement include but are not limited to: bleeding, infection, central venous injury, pneumothorax, possible venous stenosis, possible malpositioning in the venous system, and possible infections related to long-term catheter presence. The patient was aware of these risks and agreed to proceed.   Adele Barthel, MD [facility] [place] Office: [phone] Pager: [phone]  09/03/2014, 7:15 AM

## 2014-09-03 NOTE — Progress Notes (Signed)
UR completed Ayasha Ellingsen K. Alwin Lanigan, RN, BSN, [place], CCM  09/03/2014 12:58 PM

## 2014-09-03 NOTE — Progress Notes (Signed)
Pt Hgb 6.5, creatinine 9.13, Kidney MD notified, pt is to have diatech put in to have dialysis today, MD will address to transfuse blood in dialysis, will continue to monitor, thanks Arvella Nigh RN

## 2014-09-03 NOTE — Procedures (Addendum)
Patient seen on Hemodialysis. QB 200, UF goal 3L Treatment adjusted as needed.  Elmarie Shiley MD [facility]. Office # [phone] Pager # [phone] 3:37 PM

## 2014-09-03 NOTE — Anesthesia Preprocedure Evaluation (Addendum)
Anesthesia Evaluation  Patient identified by MRN, date of birth, ID band Patient awake    Reviewed: Allergy & Precautions, H&P , NPO status , Patient's Chart, lab work & pertinent test results  Airway Mallampati: III  TM Distance: >3 FB Neck ROM: Full    Dental  (+) Edentulous Upper, Edentulous Lower   Pulmonary neg pulmonary ROS,          Cardiovascular hypertension, Pt. on medications and Pt. on home beta blockers     Neuro/Psych negative neurological ROS     GI/Hepatic negative GI ROS, Neg liver ROS,   Endo/Other  diabetes, Type 2, Oral Hypoglycemic Agents  Renal/GU ESRF and DialysisRenal disease     Musculoskeletal   Abdominal   Peds  Hematology  (+) anemia ,   Anesthesia Other Findings   Reproductive/Obstetrics                            Anesthesia Physical Anesthesia Plan  ASA: III  Anesthesia Plan: MAC   Post-op Pain Management:    Induction: Intravenous  Airway Management Planned: Natural Airway and Simple Face Mask  Additional Equipment:   Intra-op Plan:   Post-operative Plan:   Informed Consent: I have reviewed the patients History and Physical, chart, labs and discussed the procedure including the risks, benefits and alternatives for the proposed anesthesia with the patient or authorized representative who has indicated his/her understanding and acceptance.     Plan Discussed with: CRNA and Surgeon  Anesthesia Plan Comments:        Anesthesia Quick Evaluation

## 2014-09-03 NOTE — Progress Notes (Signed)
[facility] PROGRESS NOTE  WARREN CHRYSTAL [id number] DOB: 1950/12/31 DOA: 09/01/2014 PCP: Cathlean Cower, MD  Assessment/Plan: 1. End-stage renal disease -Patient progressing to end-stage renal disease, presenting with pulmonary edema, minimally responsive diuretic therapy, lab work showing creatinine in the 9 point range. -Nephrology recommending starting renal replacement therapy -Vascular surgery was consulted, undergoing right internal jugular vein tunneled dialysis catheter placement, procedure performed on 09/03/2014 -Plan to start hemodialysis on 09/03/2014 after placement of access -Plan for placement of a more permanent access or hemodialysis as she will require outpatient hemodialysis  2.  Pulmonary edema/shortness of breath -Likely secondary to end-stage renal disease -Patient was administered IV diuretics with poor response -Patient likely to improve with hemodialysis  3.  Hyponatremia -Lab work showing a downward trend in sodium to 129 -IV secondary to volume overload in setting of end-stage renal disease -Initiating renal replacement therapy  4.  Hypoglycemia -Patient with history of type 2 diabetes mellitus had been on Amaryl, having probable decreased renal clearance of oral hypoglycemics in setting of end-stage renal disease -Continue sliding scale coverage  Code Status: Full code Family Communication: I spoke to her sister who is present at bedside Disposition Plan: Plan for hemodialysis today   Consultants:  Nephrology  Vascular surgery  Procedures:  Right internal jugular vein tunneled dialysis catheter placement   HPI/Subjective: Patient is a 64 year old female with a past medical history of near end-stage renal disease, type 2 diabetes mellitus, hypertension, status post right below-knee amputation who was admitted to the medicine service on 09/01/2014 presenting with complaints of shortness of breath, generalized weakness, have an overall  functional decline. Lab work revealed further increase in her creatinine to 9.2 with BUN of 71. Her potassium was within normal limits at 4.8. Patient was seen and evaluated by nephrology recommended initiating renal replacement therapy. Vascular surgery was consulted as she underwent right internal jugular vein tunneled dialysis catheter placement, procedure performed on 09/03/2014. Hemodialysis to be performed after procedure.  Objective: Filed Vitals:   09/03/14 1730  BP: 185/88  Pulse: 93  Temp:   Resp:     Intake/Output Summary (Last 24 hours) at 09/03/14 1747 Last data filed at 09/03/14 1452  Gross per 24 hour  Intake  641.5 ml  Output   1260 ml  Net -618.5 ml   Filed Weights   09/02/14 0525 09/03/14 0526 09/03/14 1518  Weight: 64.411 kg (142 lb) 66.2 kg (145 lb 15.1 oz) 66.2 kg (145 lb 15.1 oz)    Exam:   General:  No acute distress, following commands  Cardiovascular: Regular rate and rhythm normal S1-S2  Respiratory: Normal respiratory effort, patient having by basilar crackles, not using accessory muscles  Abdomen: Soft nontender nondistended  Musculoskeletal: Trace edema to left extremity, status post below the knee amputation 2 right lower extremity  Data Reviewed: Basic Metabolic Panel:  Recent Labs Lab 09/01/14 1047 09/02/14 0330 09/03/14 0420  NA 133* 133* 129*  K 4.8 4.2 4.3  CL 105 108 102  CO2 12* 18* 15*  GLUCOSE 305* 37* 120*  BUN 71* 74* 72*  CREATININE 9.20* 9.12* 9.13*  CALCIUM 7.4* 7.3* 7.3*  PHOS  --  9.6* 8.1*   Liver Function Tests:  Recent Labs Lab 09/01/14 1047 09/02/14 0330 09/03/14 0420  AST 23  --   --   ALT 15  --   --   ALKPHOS 81  --   --   BILITOT 0.4  --   --   PROT 6.9  --   --  ALBUMIN 2.5* 2.1* 2.0*   No results for input(s): LIPASE, AMYLASE in the last 168 hours. No results for input(s): AMMONIA in the last 168 hours. CBC:  Recent Labs Lab 09/01/14 1047 09/02/14 0330 09/03/14 0420  WBC 6.9 8.7 6.7   NEUTROABS 5.9  --   --   HGB 7.9* 7.0* 6.5*  HCT 23.7* 21.6* 19.6*  MCV 88.8 86.7 85.2  PLT 249 221 204   Cardiac Enzymes: No results for input(s): CKTOTAL, CKMB, CKMBINDEX, TROPONINI in the last 168 hours. BNP (last 3 results) No results for input(s): PROBNP in the last 8760 hours. CBG:  Recent Labs Lab 09/03/14 0305 09/03/14 0620 09/03/14 0824 09/03/14 1133 09/03/14 1504  GLUCAP 135* 92 120* 114* 151*    Recent Results (from the past 240 hour(s))  Clostridium Difficile by PCR     Status: Abnormal   Collection Time: 09/02/14  7:59 AM  Result Value Ref Range Status   C difficile by pcr POSITIVE (A) NEGATIVE Final    Comment: CRITICAL RESULT CALLED TO, READ BACK BY AND VERIFIED WITH: E.MILIANO,RN 09/02/14 @1110  BY V.WILKINS      Studies: Dg Chest Port 1 View  09/03/2014   CLINICAL DATA:  End-stage renal disease. Status post Diatek catheter placement.  EXAM: PORTABLE CHEST - 1 VIEW  COMPARISON:  09/01/2014  FINDINGS: Right internal jugular Diatek catheter noted with distal tip in the right atrium and proximal tip at the cavoatrial junction. No pneumothorax.  Upper normal heart size. Perihilar interstitial accentuation and faint perihilar airspace opacity. No blunting of the costophrenic angles.  Degenerative glenohumeral arthropathy bilaterally.  IMPRESSION: 1. Right IJ Diatek catheter noted, distal tip in the right atrium. No pneumothorax. 2. Increase in perihilar interstitial and patchy airspace opacity, suspicious for edema.   Electronically Signed   By: Sherryl Barters M.D.   On: 09/03/2014 08:38    Scheduled Meds: . sodium chloride   Intravenous Once  . amLODipine  10 mg Oral Daily  . antiseptic oral rinse  7 mL Mouth Rinse BID  . aspirin EC  81 mg Oral Daily  . calcium carbonate  400 mg of elemental calcium Oral TID WC  . cloNIDine  0.1 mg Oral BID  . ferrous sulfate  325 mg Oral Q breakfast  . gabapentin  100 mg Oral QHS  . heparin  5,000 Units Subcutaneous 3  times per day  . metroNIDAZOLE  500 mg Oral 3 times per day  . multivitamin with minerals  1 tablet Oral Daily  . sodium chloride  3 mL Intravenous Q12H   Continuous Infusions:   Principal Problem:   Pulmonary edema Active Problems:   Essential hypertension   End stage renal disease   S/P BKA (below knee amputation)   Type 2 diabetes mellitus with ESRD (end-stage renal disease)   Anemia of renal disease    Time spent: 35 min    Kelvin Cellar  [facility] Pager [phone]. If 7PM-7AM, please contact night-coverage at [email or web address], password [facility] 09/03/2014, 5:47 PM  LOS: 2 days

## 2014-09-03 NOTE — Transfer of Care (Signed)
Immediate Anesthesia Transfer of Care Note  Patient: Carolyn Valenzuela  Procedure(s) Performed: Procedure(s): INSERTION OF DIALYSIS CATHETER RIGHT INTERNAL JUGULAR VEIN (N/A)  Patient Location: PACU  Anesthesia Type:MAC  Level of Consciousness: awake, alert  and oriented  Airway & Oxygen Therapy: Patient connected to face mask  Post-op Assessment: Report given to PACU RN, Post -op Vital signs reviewed and stable, Patient moving all extremities and Patient moving all extremities X 4  Post vital signs: Reviewed and stable  Complications: No apparent anesthesia complications

## 2014-09-03 NOTE — Anesthesia Postprocedure Evaluation (Signed)
  Anesthesia Post-op Note  Patient: Carolyn Valenzuela  Procedure(s) Performed: Procedure(s): INSERTION OF DIALYSIS CATHETER RIGHT INTERNAL JUGULAR VEIN (N/A)  Patient Location: PACU  Anesthesia Type:MAC  Level of Consciousness: awake and alert   Airway and Oxygen Therapy: Patient Spontanous Breathing  Post-op Pain: none  Post-op Assessment: Post-op Vital signs reviewed  Post-op Vital Signs: Reviewed  Last Vitals:  Filed Vitals:   09/03/14 0853  BP: 165/78  Pulse: 75  Temp:   Resp: 10    Complications: No apparent anesthesia complications

## 2014-09-03 NOTE — H&P (View-Only) (Signed)
[facility] [place] HISTORY AND PHYSICAL  Reason for consult: needs hemodialysis access Requesting: Pearson Grippe MD History of Present Illness:  Patient is a 64 y.o. year old female who presents for evaluation for permanent hemodialysis access.  Pt had left radial cephalic fistula placed by my partner Dr Scot Dock 2011.  This has been chronically occluded and pt never consented to a new access.  She has very poor insight into her current situation as far as her kidney function is concerned.  She is right handed.  Other medical problems include diabetes, hyperlipid, hypertension all of which are currently stable.  Past Medical History  Diagnosis Date  . DIABETES MELLITUS, UNCONTROLLED 05/21/2009  . HYPERLIPIDEMIA 03/30/2007  . ANEMIA-IRON DEFICIENCY 01/25/2008  . HYPERTENSION 01/25/2008  . SINUSITIS- ACUTE-NOS 01/25/2008  . SECONDARY HYPERPARATHYROIDISM 05/21/2009  . RENAL INSUFFICIENCY 02/01/2008  . CERVICAL RADICULOPATHY, LEFT 01/25/2008  . BACK PAIN 05/02/2009  . NUMBNESS 05/21/2009  . Allergic rhinitis, cause unspecified 12/08/2010  . Foot ulcer     right lateral malleolus  . Critical lower limb ischemia     Past Surgical History  Procedure Laterality Date  . Av fistula placement Left   . Eye surgery Bilateral     cataracts removed, left eye still has some oil in it.  . Colonoscopy    . Amputation Right 03/15/2014    Procedure: RIGHT  LEG  BELOW KNEE AMPUTATION ;  Surgeon: [name], MD;  Location: [facility];  Service: Orthopedics;  Laterality: Right;    Social History History  Substance Use Topics  . Smoking status: Never Smoker   . Smokeless tobacco: Never Used  . Alcohol Use: No    Family History Family History  Problem Relation Age of Onset  . Cancer Mother     Pancreatic    Allergies  Allergies  Allergen Reactions  . Oxycodone Itching     Current Facility-Administered Medications  Medication Dose Route Frequency Provider Last Rate Last Dose  .  acetaminophen (TYLENOL) tablet 650 mg  650 mg Oral Q6H PRN Delfina Redwood, MD   650 mg at 09/01/14 2348   Or  . acetaminophen (TYLENOL) suppository 650 mg  650 mg Rectal Q6H PRN Delfina Redwood, MD      . albuterol (PROVENTIL) (2.5 MG/3ML) 0.083% nebulizer solution 2.5 mg  2.5 mg Nebulization Q2H PRN Delfina Redwood, MD      . amLODipine (NORVASC) tablet 10 mg  10 mg Oral Daily Delfina Redwood, MD   10 mg at 09/02/14 1040  . antiseptic oral rinse (CPC / CETYLPYRIDINIUM CHLORIDE 0.05%) solution 7 mL  7 mL Mouth Rinse BID Delfina Redwood, MD   7 mL at 09/01/14 2130  . aspirin EC tablet 81 mg  81 mg Oral Daily Delfina Redwood, MD   81 mg at 09/02/14 1040  . calcium carbonate (TUMS - dosed in mg elemental calcium) chewable tablet 400 mg of elemental calcium  400 mg of elemental calcium Oral TID WC Rexene Agent, MD   400 mg of elemental calcium at 09/02/14 1635  . cloNIDine (CATAPRES) tablet 0.1 mg  0.1 mg Oral BID Delfina Redwood, MD   0.1 mg at 09/02/14 1041  . dextrose 5 % solution   Intravenous Continuous Thurnell Lose, MD 35 mL/hr at 09/02/14 1609    . ferrous sulfate tablet 325 mg  325 mg Oral Q breakfast Delfina Redwood, MD   325 mg at 09/02/14 1041  .  furosemide (LASIX) 120 mg in dextrose 5 % 50 mL IVPB  120 mg Intravenous 3 times per day Rexene Agent, MD   120 mg at 09/02/14 1637  . gabapentin (NEURONTIN) capsule 100 mg  100 mg Oral QHS Delfina Redwood, MD   100 mg at 09/01/14 2114  . heparin injection 5,000 Units  5,000 Units Subcutaneous 3 times per day Delfina Redwood, MD   5,000 Units at 09/02/14 1610  . hydrALAZINE (APRESOLINE) injection 10 mg  10 mg Intravenous Q6H PRN Thurnell Lose, MD      . metroNIDAZOLE (FLAGYL) tablet 500 mg  500 mg Oral 3 times per day Thurnell Lose, MD   500 mg at 09/02/14 1609  . multivitamin with minerals tablet 1 tablet  1 tablet Oral Daily Delfina Redwood, MD   1 tablet at 09/02/14 1041  . ondansetron (ZOFRAN)  tablet 4 mg  4 mg Oral Q6H PRN Delfina Redwood, MD       Or  . ondansetron [facility]) injection 4 mg  4 mg Intravenous Q6H PRN Delfina Redwood, MD      . sodium bicarbonate tablet 650 mg  650 mg Oral BID Rexene Agent, MD   650 mg at 09/02/14 1041  . sodium chloride 0.9 % injection 3 mL  3 mL Intravenous Q12H Delfina Redwood, MD   3 mL at 09/02/14 1041    ROS:   General:  No weight loss, Fever, chills  HEENT: No recent headaches, no nasal bleeding, no visual changes, no sore throat  Neurologic: No dizziness, blackouts, seizures. No recent symptoms of stroke or mini- stroke. No recent episodes of slurred speech, or temporary blindness.  Cardiac: No recent episodes of chest pain/pressure, no shortness of breath at rest.  + shortness of breath with exertion.  Denies history of atrial fibrillation or irregular heartbeat  Vascular: No history of rest pain in feet.  No history of claudication.  + history of non-healing ulcer had Right BKA by Dr Doran Durand 5-6 mo ago No history of DVT   Pulmonary: No home oxygen, no productive cough, no hemoptysis,  No asthma or wheezing  Musculoskeletal:  [x ] Arthritis, [x ] Low back pain,  [ ]  Joint pain  Hematologic:No history of hypercoagulable state.  No history of easy bleeding.  No history of anemia  Gastrointestinal: No hematochezia or melena,  No gastroesophageal reflux, no trouble swallowing  Urinary: [x ] chronic Kidney disease, [ ]  on HD - [ ]  MWF or [ ]  TTHS, [ ]  Burning with urination, [ ]  Frequent urination, [ ]  Difficulty urinating;   Skin: No rashes  Psychological: No history of anxiety,  No history of depression   Physical Examination  Filed Vitals:   09/02/14 0000 09/02/14 0525 09/02/14 0929 09/02/14 1040  BP: 163/66 143/52 145/66   Pulse: 84 72 79   Temp: 98.3 F (36.8 C) 98.2 F (36.8 C) 97.9 F (36.6 C)   TempSrc: Oral Oral Oral   Resp: 18 18 18    Height:      Weight:  142 lb (64.411 kg)    SpO2: 95% 100% 96% 98%     Body mass index is 27.73 kg/(m^2).  General:  Alert and oriented, no acute distress HEENT: Normal Neck: No JVD no obvious central line scars Pulmonary: Clear to auscultation bilaterally Cardiac: Regular Rate and Rhythm  Abdomen: Soft, non-tender, non-distended Skin: No rash Extremity Pulses:  2+ radial, brachial, pulses bilaterally Neurologic: Upper  extremity motor 5/5 and symmetric  DATA:   CBC    Component Value Date/Time   WBC 8.7 09/02/2014 0330   RBC 2.42* 09/02/2014 1140   RBC 2.49* 09/02/2014 0330   HGB 7.0* 09/02/2014 0330   HCT 21.6* 09/02/2014 0330   PLT 221 09/02/2014 0330   MCV 86.7 09/02/2014 0330   MCH 28.1 09/02/2014 0330   MCHC 32.4 09/02/2014 0330   RDW 14.7 09/02/2014 0330   LYMPHSABS 0.8 09/01/2014 1047   MONOABS 0.2 09/01/2014 1047   EOSABS 0.0 09/01/2014 1047   BASOSABS 0.0 09/01/2014 1047     BMET    Component Value Date/Time   NA 133* 09/02/2014 0330   K 4.2 09/02/2014 0330   CL 108 09/02/2014 0330   CO2 18* 09/02/2014 0330   GLUCOSE 37* 09/02/2014 0330   BUN 74* 09/02/2014 0330   CREATININE 9.12* 09/02/2014 0330   CALCIUM 7.3* 09/02/2014 0330   CALCIUM 7.2* 08/07/2014 1406   GFRNONAA 4* 09/02/2014 0330   GFRAA 5* 09/02/2014 0330       ASSESSMENT:  Needs long term hemodialysis access CKD 5   PLAN:  Diatek catheter tomorrow by my partner Dr Bridgett Larsson.  Risks benefits and procedure discussed. Will get vein mapping this admission and try to place permanent access prior to d/c  NPO p midnight, consent CBC BMET am  Ruta Hinds, MD [facility] [place]: [phone] Pager: [phone]

## 2014-09-03 NOTE — Progress Notes (Signed)
Patient ID: Carolyn Valenzuela, female   DOB: 1951-04-06, 64 y.o.   MRN: [id number]   [facility] Progress Note    Assessment/ Plan:   1. End-stage renal disease from progressive chronic kidney disease-presentation with pulmonary edema/shortness of breath and poor response to diuretic therapy. Status post hemodialysis catheter placement today to initiate hemodialysis. To undergo permanent access placement at some point later this hospitalization prior to discharge per vascular surgery. The process for outpatient dialysis unit placement has been started 2. Volume overload/pulmonary edema: Poor response to furosemide-start hemodialysis today via tunneled dialysis catheter. 3. Anemia: On ESA as an outpatient-significantly anemic, will transfuse tomorrow at dialysis. 4. Metabolic bone disease: Started on calcium carbonate for phosphorus binding 5. Metabolic acidosis: Anticipated to improve with hemodialysis-would discontinue oral sodium bicarbonate therapy after starting dialysis   Subjective:   Reports to be in pain and wants a pain pill-shortness of breath only when she mobilizes   Objective:   BP 165/78 mmHg  Pulse 75  Temp(Src) 97.4 F (36.3 C) (Oral)  Resp 10  Ht 5' (1.524 m)  Wt 66.2 kg (145 lb 15.1 oz)  BMI 28.50 kg/m2  SpO2 99%  Intake/Output Summary (Last 24 hours) at 09/03/14 1209 Last data filed at 09/03/14 1135  Gross per 24 hour  Intake  779.5 ml  Output    810 ml  Net  -30.5 ml   Weight change: 14.036 kg (30 lb 15.1 oz)  Physical Exam: Gen: Uncomfortable resting in bed CVS: Pulse regular in rate and rhythm Resp: Fine rales by basally Abd: Soft, obese, nontender Ext: 1-2+ lower extremity edema  Imaging: Dg Chest 2 View  09/01/2014   CLINICAL DATA:  Cough, wheezing and shortness of breath. Foreign body projected over the left chest at the time of portable chest x-ray earlier today.  EXAM: CHEST - 2 VIEW  COMPARISON:  Portable film earlier today at 1057 hr   FINDINGS: The radiopaque hair pin is no longer visualized and presumably has been removed. No foreign body is identified in the chest. Lungs show persistent mild interstitial prominence with associated cardiomegaly and small bilateral pleural effusions. Findings are consistent with interstitial edema. Bony structures are unremarkable.  IMPRESSION: No further visualized hair pin overlying the mid left chest. Evidence of mild interstitial edema.   Electronically Signed   By: Aletta Edouard M.D.   On: 09/01/2014 12:56   Dg Chest Port 1 View  09/03/2014   CLINICAL DATA:  End-stage renal disease. Status post Diatek catheter placement.  EXAM: PORTABLE CHEST - 1 VIEW  COMPARISON:  09/01/2014  FINDINGS: Right internal jugular Diatek catheter noted with distal tip in the right atrium and proximal tip at the cavoatrial junction. No pneumothorax.  Upper normal heart size. Perihilar interstitial accentuation and faint perihilar airspace opacity. No blunting of the costophrenic angles.  Degenerative glenohumeral arthropathy bilaterally.  IMPRESSION: 1. Right IJ Diatek catheter noted, distal tip in the right atrium. No pneumothorax. 2. Increase in perihilar interstitial and patchy airspace opacity, suspicious for edema.   Electronically Signed   By: Sherryl Barters M.D.   On: 09/03/2014 08:38    Labs: BMET  Recent Labs Lab 09/01/14 1047 09/02/14 0330 09/03/14 0420  NA 133* 133* 129*  K 4.8 4.2 4.3  CL 105 108 102  CO2 12* 18* 15*  GLUCOSE 305* 37* 120*  BUN 71* 74* 72*  CREATININE 9.20* 9.12* 9.13*  CALCIUM 7.4* 7.3* 7.3*  PHOS  --  9.6* 8.1*   CBC  Recent Labs Lab 09/01/14 1047 09/02/14 0330 09/03/14 0420  WBC 6.9 8.7 6.7  NEUTROABS 5.9  --   --   HGB 7.9* 7.0* 6.5*  HCT 23.7* 21.6* 19.6*  MCV 88.8 86.7 85.2  PLT 249 221 204    Medications:    . amLODipine  10 mg Oral Daily  . antiseptic oral rinse  7 mL Mouth Rinse BID  . aspirin EC  81 mg Oral Daily  . calcium carbonate  400 mg of  elemental calcium Oral TID WC  . cloNIDine  0.1 mg Oral BID  . ferrous sulfate  325 mg Oral Q breakfast  . furosemide  120 mg Intravenous 3 times per day  . gabapentin  100 mg Oral QHS  . heparin  5,000 Units Subcutaneous 3 times per day  . metroNIDAZOLE  500 mg Oral 3 times per day  . multivitamin with minerals  1 tablet Oral Daily  . sodium bicarbonate  650 mg Oral BID  . sodium chloride  3 mL Intravenous Q12H   Elmarie Shiley, MD 09/03/2014, 12:09 PM

## 2014-09-03 NOTE — Care Management Note (Addendum)
    Page 1 of 2   09/13/2014     3:37:45 PM CARE MANAGEMENT NOTE 09/13/2014  Patient:  Carolyn Valenzuela, Carolyn Valenzuela   Account Number:  [id number]  Date Initiated:  09/03/2014  Documentation initiated by:  Mariann Laster  Subjective/Objective Assessment:   hypoglycemia, weakness, shortness of breath     Action/Plan:   CM to follow for disposition needs   Anticipated DC Date:  09/06/2014   Anticipated DC Plan:  HOME/SELF CARE  In-house referral  Clinical Social Worker      DC Forensic scientist  CM consult  Other      [facility] Choice  HOME HEALTH   Choice offered to / List presented to:  C-1 Patient        [place] arranged  HH-1 RN  [place].   Status of service:  Completed, signed off Medicare Important Message given?  NO (If response is "NO", the following Medicare IM given date fields will be blank) Date Medicare IM given:   Medicare IM given by:   Date Additional Medicare IM given:   Additional Medicare IM given by:    Discharge Disposition:  HOME/SELF CARE  Per UR Regulation:  Reviewed for med. necessity/level of care/duration of stay  If discussed at [place] of Stay Meetings, dates discussed:   09/11/2014    Comments:  Mariann Laster RN, BSN, MSHL, CCM  Nurse - Case Manager,  (Unit [place]) [phone]  09/13/2014 Home with [place], BSN, MSHL, CCM  Nurse - Case Manager,  (Unit [place]) [phone]  09/10/2014 Patient with new fever; IV ABX started. Dispostion Plan:  home with HHS:  RN, PT, HHA ([facility] has been notified)    09/08/14 18:00 CM met with Niece and spoke with sister of pt (pt in dialysis) concerning discharge.  Pt is going home with sister and chooses [facility] to render HHPT/Aide. Sister, [name] to pick up pt at discharge.  Referral called to [facility] rep, Stephanie.  No other CM needs were communicated. Mariane Masters, BSN, [place].   Jhamari Markowicz RN,  BSN, MSHL, CCM  Nurse - Case Manager,  (Unit [place]) [phone]  09/05/2014 Social:   From home Supportive Sister:  Raynelle Jan, [phone] home [phone] cell provides transportation to appointments. Patient and sister have questions re:  MCD - applied few months ago but has not heard anything since that time. CM contacted [place] / Philipp Ovens who confirms showing active in system.  Caryl Pina updated system with this information.  CM provided copy of MCD information and ID # to patient and sister. Transportation:  yes / sister but states concerns re: transportation to HD.  (CM notifed SW/Donna Crowder of patients concern)  Disposition Plan:  Home / Self Care with resumption of Outpatient PT (resume outpatient PT services for prosthetics) CM faxed order to [place], [place], [place], [place]  [id number] Disposition Plan: per note:  Plan for hemodialysis again tomorrow to get her to an outpatient TTS schedule on which she has been accepted at [facility] and to allow for permanent access placement on Friday CM will continue to monitor for d/c planning needs

## 2014-09-04 ENCOUNTER — Ambulatory Visit: Payer: BC Managed Care – PPO | Admitting: Physical Therapy

## 2014-09-04 ENCOUNTER — Encounter (HOSPITAL_COMMUNITY): Payer: Self-pay | Admitting: Vascular Surgery

## 2014-09-04 ENCOUNTER — Inpatient Hospital Stay (HOSPITAL_COMMUNITY): Admission: RE | Admit: 2014-09-04 | Payer: BC Managed Care – PPO | Source: Ambulatory Visit

## 2014-09-04 DIAGNOSIS — Z89519 Acquired absence of unspecified leg below knee: Secondary | ICD-10-CM

## 2014-09-04 DIAGNOSIS — I517 Cardiomegaly: Secondary | ICD-10-CM

## 2014-09-04 DIAGNOSIS — Z01810 Encounter for preprocedural cardiovascular examination: Secondary | ICD-10-CM

## 2014-09-04 LAB — RENAL FUNCTION PANEL
Albumin: 1.9 g/dL — ABNORMAL LOW (ref 3.5–5.2)
Anion gap: 9 (ref 5–15)
BUN: 45 mg/dL — ABNORMAL HIGH (ref 6–23)
CO2: 22 mmol/L (ref 19–32)
Calcium: 7.6 mg/dL — ABNORMAL LOW (ref 8.4–10.5)
Chloride: 103 mEq/L (ref 96–112)
Creatinine, Ser: 6.75 mg/dL — ABNORMAL HIGH (ref 0.50–1.10)
GFR calc Af Amer: 7 mL/min — ABNORMAL LOW (ref >90)
GFR calc non Af Amer: 6 mL/min — ABNORMAL LOW (ref >90)
Glucose, Bld: 104 mg/dL — ABNORMAL HIGH (ref 70–99)
Phosphorus: 6.2 mg/dL — ABNORMAL HIGH (ref 2.3–4.6)
Potassium: 3.9 mmol/L (ref 3.5–5.1)
Sodium: 134 mmol/L — ABNORMAL LOW (ref 135–145)

## 2014-09-04 LAB — CBC
HCT: 22.5 % — ABNORMAL LOW (ref 36.0–46.0)
Hemoglobin: 7.4 g/dL — ABNORMAL LOW (ref 12.0–15.0)
MCH: 28.4 pg (ref 26.0–34.0)
MCHC: 32.9 g/dL (ref 30.0–36.0)
MCV: 86.2 fL (ref 78.0–100.0)
Platelets: 240 10*3/uL (ref 150–400)
RBC: 2.61 MIL/uL — ABNORMAL LOW (ref 3.87–5.11)
RDW: 14.8 % (ref 11.5–15.5)
WBC: 7 10*3/uL (ref 4.0–10.5)

## 2014-09-04 LAB — BASIC METABOLIC PANEL
Anion gap: 9 (ref 5–15)
BUN: 44 mg/dL — ABNORMAL HIGH (ref 6–23)
CO2: 22 mmol/L (ref 19–32)
Calcium: 7.6 mg/dL — ABNORMAL LOW (ref 8.4–10.5)
Chloride: 104 mEq/L (ref 96–112)
Creatinine, Ser: 6.78 mg/dL — ABNORMAL HIGH (ref 0.50–1.10)
GFR calc Af Amer: 7 mL/min — ABNORMAL LOW (ref >90)
GFR calc non Af Amer: 6 mL/min — ABNORMAL LOW (ref >90)
Glucose, Bld: 104 mg/dL — ABNORMAL HIGH (ref 70–99)
Potassium: 3.9 mmol/L (ref 3.5–5.1)
Sodium: 135 mmol/L (ref 135–145)

## 2014-09-04 LAB — GLUCOSE, CAPILLARY
Glucose-Capillary: 91 mg/dL (ref 70–99)
Glucose-Capillary: 158 mg/dL — ABNORMAL HIGH (ref 70–99)
Glucose-Capillary: 162 mg/dL — ABNORMAL HIGH (ref 70–99)

## 2014-09-04 NOTE — Progress Notes (Signed)
Patient ID: Carolyn Valenzuela, female   DOB: 10-13-1950, 64 y.o.   MRN: [id number]  [facility] Progress Note   Assessment/ Plan:   1. End-stage renal disease from progressive chronic kidney disease-presentation with pulmonary edema/shortness of breath and poor response to diuretic therapy. Hemodialysis initiated yesterday via tunneled dialysis catheter-permanent access placement later during this hospitalization. Plan for next hemodialysis treatment today with ultrafiltration. 2. Volume overload/pulmonary edema: Poor response to furosemide-start hemodialysis yesterday for ultrafiltration/volume management and clearance  3. Anemia: On ESA as an outpatient-significantly anemic, will transfuse today at dialysis. 4. Metabolic bone disease: Started on calcium carbonate for phosphorus binding 5. Metabolic acidosis: Improving on hemodialysis-sodium bicarbonate discontinued  Subjective:   Reports to be having some nausea this morning-had a restless night due to noise    Objective:   BP 158/71 mmHg  Pulse 79  Temp(Src) 98.3 F (36.8 C) (Oral)  Resp 18  Ht 5' (1.524 m)  Wt 61.598 kg (135 lb 12.8 oz)  BMI 26.52 kg/m2  SpO2 97%  Physical Exam: Gen: Comfortably resting in bed CVS: Pulse regular in rate and rhythm Resp: Clear to auscultation anteriorly-no rales Abd: Soft, flat, nontender Ext: Status post right BKA, left lower extremity without edema  Labs: BMET  Recent Labs Lab 09/01/14 1047 09/02/14 0330 09/03/14 0420 09/04/14 0336  NA 133* 133* 129* 135  134*  K 4.8 4.2 4.3 3.9  3.9  CL 105 108 102 104  103  CO2 12* 18* 15* 22  22  GLUCOSE 305* 37* 120* 104*  104*  BUN 71* 74* 72* 44*  45*  CREATININE 9.20* 9.12* 9.13* 6.78*  6.75*  CALCIUM 7.4* 7.3* 7.3* 7.6*  7.6*  PHOS  --  9.6* 8.1* 6.2*   CBC  Recent Labs Lab 09/01/14 1047 09/02/14 0330 09/03/14 0420 09/04/14 0835  WBC 6.9 8.7 6.7 7.0  NEUTROABS 5.9  --   --   --   HGB 7.9* 7.0* 6.5* 7.4*   HCT 23.7* 21.6* 19.6* 22.5*  MCV 88.8 86.7 85.2 86.2  PLT 249 221 204 240   Medications:    . sodium chloride   Intravenous Once  . amLODipine  10 mg Oral Daily  . aspirin EC  81 mg Oral Daily  . calcium carbonate  400 mg of elemental calcium Oral TID WC  . cloNIDine  0.1 mg Oral BID  . ferrous sulfate  325 mg Oral Q breakfast  . gabapentin  100 mg Oral QHS  . heparin  5,000 Units Subcutaneous 3 times per day  . metroNIDAZOLE  500 mg Oral 3 times per day  . multivitamin with minerals  1 tablet Oral Daily  . sodium chloride  3 mL Intravenous Q12H   Elmarie Shiley, MD 09/04/2014, 10:07 AM

## 2014-09-04 NOTE — Progress Notes (Signed)
09/04/2014 11:04 AM Hemodialysis Outpatient Note; this patient has been accepted at the [facility] on a Tuesday, Thursday and Saturday 2nd shift schedule. If the patient has to begin on a Saturday she will need to visit the center on Friday to sign paperwork and consents. Thank you. Gordy Savers

## 2014-09-04 NOTE — Progress Notes (Signed)
[facility] PROGRESS NOTE  Carolyn Valenzuela [id number] DOB: Nov 12, 1950 DOA: 09/01/2014 PCP: Cathlean Cower, MD  Assessment/Plan: 1. End-stage renal disease -Patient progressing to end-stage renal disease, presenting with pulmonary edema, minimally responsive diuretic therapy, lab work showing creatinine in the 9 point range. -Nephrology recommending starting renal replacement therapy -Vascular surgery was consulted, undergoing right internal jugular vein tunneled dialysis catheter placement, procedure performed on 09/03/2014 -Patient undergoing vein mapping today -Plan for HD today  2.  Pulmonary edema/shortness of breath -Likely secondary to end-stage renal disease -Patient was administered IV diuretics with poor response -Improved with RRT  3.  Hyponatremia -Lab work showing a downward trend in sodium to 129 -IV secondary to volume overload in setting of end-stage renal disease -Improved to 135 on AM labs after undergoing HD  4.  Hypoglycemia -Patient with history of type 2 diabetes mellitus had been on Amaryl, having probable decreased renal clearance of oral hypoglycemics in setting of end-stage renal disease -Continue sliding scale coverage  Code Status: Full code Family Communication: I spoke to her sister who is present at bedside Disposition Plan: Plan for hemodialysis today   Consultants:  Nephrology  Vascular surgery  Procedures:  Right internal jugular vein tunneled dialysis catheter placement   HPI/Subjective: Patient is a 64 year old female with a past medical history of near end-stage renal disease, type 2 diabetes mellitus, hypertension, status post right below-knee amputation who was admitted to the medicine service on 09/01/2014 presenting with complaints of shortness of breath, generalized weakness, have an overall functional decline. Lab work revealed further increase in her creatinine to 9.2 with BUN of 71. Her potassium was within normal limits at  4.8. Patient was seen and evaluated by nephrology recommended initiating renal replacement therapy. Vascular surgery was consulted as she underwent right internal jugular vein tunneled dialysis catheter placement, procedure performed on 09/03/2014. Hemodialysis to be performed after procedure.  Objective: Filed Vitals:   09/04/14 1630  BP: 190/86  Pulse: 86  Temp:   Resp:     Intake/Output Summary (Last 24 hours) at 09/04/14 1658 Last data filed at 09/04/14 1615  Gross per 24 hour  Intake   1101 ml  Output   3000 ml  Net  -1899 ml   Filed Weights   09/03/14 1752 09/04/14 0430 09/04/14 1344  Weight: 64.3 kg (141 lb 12.1 oz) 61.598 kg (135 lb 12.8 oz) 63.9 kg (140 lb 14 oz)    Exam:   General:  No acute distress, following commands  Cardiovascular: Regular rate and rhythm normal S1-S2  Respiratory: Normal respiratory effort, patient having by basilar crackles, not using accessory muscles  Abdomen: Soft nontender nondistended  Musculoskeletal: Trace edema to left extremity, status post below the knee amputation 2 right lower extremity  Data Reviewed: Basic Metabolic Panel:  Recent Labs Lab 09/01/14 1047 09/02/14 0330 09/03/14 0420 09/04/14 0336  NA 133* 133* 129* 135  134*  K 4.8 4.2 4.3 3.9  3.9  CL 105 108 102 104  103  CO2 12* 18* 15* 22  22  GLUCOSE 305* 37* 120* 104*  104*  BUN 71* 74* 72* 44*  45*  CREATININE 9.20* 9.12* 9.13* 6.78*  6.75*  CALCIUM 7.4* 7.3* 7.3* 7.6*  7.6*  PHOS  --  9.6* 8.1* 6.2*   Liver Function Tests:  Recent Labs Lab 09/01/14 1047 09/02/14 0330 09/03/14 0420 09/04/14 0336  AST 23  --   --   --   ALT 15  --   --   --  ALKPHOS 81  --   --   --   BILITOT 0.4  --   --   --   PROT 6.9  --   --   --   ALBUMIN 2.5* 2.1* 2.0* 1.9*   No results for input(s): LIPASE, AMYLASE in the last 168 hours. No results for input(s): AMMONIA in the last 168 hours. CBC:  Recent Labs Lab 09/01/14 1047 09/02/14 0330 09/03/14 0420  09/04/14 0835  WBC 6.9 8.7 6.7 7.0  NEUTROABS 5.9  --   --   --   HGB 7.9* 7.0* 6.5* 7.4*  HCT 23.7* 21.6* 19.6* 22.5*  MCV 88.8 86.7 85.2 86.2  PLT 249 221 204 240   Cardiac Enzymes: No results for input(s): CKTOTAL, CKMB, CKMBINDEX, TROPONINI in the last 168 hours. BNP (last 3 results) No results for input(s): PROBNP in the last 8760 hours. CBG:  Recent Labs Lab 09/03/14 1133 09/03/14 1504 09/03/14 2045 09/04/14 0633 09/04/14 1118  GLUCAP 114* 151* 99 91 158*    Recent Results (from the past 240 hour(s))  Clostridium Difficile by PCR     Status: Abnormal   Collection Time: 09/02/14  7:59 AM  Result Value Ref Range Status   C difficile by pcr POSITIVE (A) NEGATIVE Final    Comment: CRITICAL RESULT CALLED TO, READ BACK BY AND VERIFIED WITH: E.MILIANO,RN 09/02/14 @1110  BY V.WILKINS      Studies: Dg Chest Port 1 View  09/03/2014   CLINICAL DATA:  End-stage renal disease. Status post Diatek catheter placement.  EXAM: PORTABLE CHEST - 1 VIEW  COMPARISON:  09/01/2014  FINDINGS: Right internal jugular Diatek catheter noted with distal tip in the right atrium and proximal tip at the cavoatrial junction. No pneumothorax.  Upper normal heart size. Perihilar interstitial accentuation and faint perihilar airspace opacity. No blunting of the costophrenic angles.  Degenerative glenohumeral arthropathy bilaterally.  IMPRESSION: 1. Right IJ Diatek catheter noted, distal tip in the right atrium. No pneumothorax. 2. Increase in perihilar interstitial and patchy airspace opacity, suspicious for edema.   Electronically Signed   By: Sherryl Barters M.D.   On: 09/03/2014 08:38    Scheduled Meds: . sodium chloride   Intravenous Once  . amLODipine  10 mg Oral Daily  . aspirin EC  81 mg Oral Daily  . calcium carbonate  400 mg of elemental calcium Oral TID WC  . cloNIDine  0.1 mg Oral BID  . ferrous sulfate  325 mg Oral Q breakfast  . gabapentin  100 mg Oral QHS  . heparin  5,000 Units  Subcutaneous 3 times per day  . metroNIDAZOLE  500 mg Oral 3 times per day  . multivitamin with minerals  1 tablet Oral Daily  . sodium chloride  3 mL Intravenous Q12H   Continuous Infusions:   Principal Problem:   Pulmonary edema Active Problems:   Essential hypertension   End stage renal disease   S/P BKA (below knee amputation)   Type 2 diabetes mellitus with ESRD (end-stage renal disease)   Anemia of renal disease    Time spent: 25 min    Kelvin Cellar  [facility] Pager [phone]. If 7PM-7AM, please contact night-coverage at [email or web address], password [facility] 09/04/2014, 4:58 PM  LOS: 3 days

## 2014-09-04 NOTE — Progress Notes (Signed)
Echocardiogram 2D Echocardiogram has been performed.  Joelene Millin 09/04/2014, 12:51 PM

## 2014-09-04 NOTE — Progress Notes (Signed)
VASCULAR LAB PRELIMINARY  PRELIMINARY  PRELIMINARY  PRELIMINARY  Right  Upper Extremity Vein Map    Cephalic  Segment Diameter Depth Comment  1. Axilla 2.74 mm 12.6 mm   2. Mid upper arm 3.01 mm 7.26 mm   3. Above AC 2.78 mm 7 mm   4. In AC 2.74 mm mm   5. Below AC 2.97 mm mm   6. Mid forearm 3.74 mm mm   7. Wrist 3.7 mm mm    mm mm    mm mm    mm mm    Basilic  Segment Diameter Depth Comment  1. Axilla mm mm   2. Mid upper arm 5.07 mm 12.3 mm   3. Above AC 5.98 mm 7.77 mm   4. In AC mm mm   5. Below AC 4.02 mm mm   6. Mid forearm 3.29 mm mm   7. Wrist 3.06 mm mm    mm mm    mm mm    mm mm   Left Upper Extremity Vein Map    Cephalic  Segment Diameter Depth Comment  1. Axilla 2.79 mm 90.8 mm   2. Mid upper arm 2.97 mm mm   3. Above AC 3.15 mm mm   4. In AC 4.52 mm mm   5. Below AC 2.74 mm mm   6. Mid forearm 2.42 mm mm   7. Wrist 2.33 mm mm    mm mm    mm mm    mm mm    Basilic  Segment Diameter Depth Comment  1. Axilla mm mm   2. Mid upper arm 2.65 mm 7.17 mm   3. Above AC 2.62 mm 5.25 mm   4. In AC mm mm   5. Below AC 3.65 mm mm   6. Mid forearm 2.54 mm mm   7. Wrist 1.6 mm mm    mm mm    mm mm    mm mm      Noemie Devivo, RVT 09/04/2014, 11:22 AM

## 2014-09-05 LAB — RENAL FUNCTION PANEL
Albumin: 2 g/dL — ABNORMAL LOW (ref 3.5–5.2)
Anion gap: 7 (ref 5–15)
BUN: 19 mg/dL (ref 6–23)
CO2: 26 mmol/L (ref 19–32)
Calcium: 7.9 mg/dL — ABNORMAL LOW (ref 8.4–10.5)
Chloride: 107 mEq/L (ref 96–112)
Creatinine, Ser: 4.37 mg/dL — ABNORMAL HIGH (ref 0.50–1.10)
GFR calc Af Amer: 12 mL/min — ABNORMAL LOW (ref >90)
GFR calc non Af Amer: 10 mL/min — ABNORMAL LOW (ref >90)
Glucose, Bld: 106 mg/dL — ABNORMAL HIGH (ref 70–99)
Phosphorus: 4 mg/dL (ref 2.3–4.6)
Potassium: 3.7 mmol/L (ref 3.5–5.1)
Sodium: 140 mmol/L (ref 135–145)

## 2014-09-05 LAB — TYPE AND SCREEN
ABO/RH(D): O POS
Antibody Screen: NEGATIVE
Unit division: 0
Unit division: 0

## 2014-09-05 LAB — GLUCOSE, CAPILLARY
Glucose-Capillary: 106 mg/dL — ABNORMAL HIGH (ref 70–99)
Glucose-Capillary: 159 mg/dL — ABNORMAL HIGH (ref 70–99)
Glucose-Capillary: 178 mg/dL — ABNORMAL HIGH (ref 70–99)
Glucose-Capillary: 289 mg/dL — ABNORMAL HIGH (ref 70–99)

## 2014-09-05 LAB — CBC
HCT: 30 % — ABNORMAL LOW (ref 36.0–46.0)
Hemoglobin: 10.1 g/dL — ABNORMAL LOW (ref 12.0–15.0)
MCH: 29.8 pg (ref 26.0–34.0)
MCHC: 33.7 g/dL (ref 30.0–36.0)
MCV: 88.5 fL (ref 78.0–100.0)
Platelets: 209 10*3/uL (ref 150–400)
RBC: 3.39 MIL/uL — ABNORMAL LOW (ref 3.87–5.11)
RDW: 15 % (ref 11.5–15.5)
WBC: 8.1 10*3/uL (ref 4.0–10.5)

## 2014-09-05 LAB — HEPATITIS B CORE ANTIBODY, IGM: Hep B C IgM: NONREACTIVE

## 2014-09-05 MED ORDER — CEFAZOLIN SODIUM 1-5 GM-% IV SOLN
1.0000 g | INTRAVENOUS | Status: AC
  Administered 2014-09-06: 1 g via INTRAVENOUS
  Filled 2014-09-05: qty 50

## 2014-09-05 MED ORDER — ISOSORBIDE MONONITRATE ER 30 MG PO TB24
30.0000 mg | ORAL_TABLET | Freq: Every day | ORAL | Status: DC
  Administered 2014-09-05 – 2014-09-13 (×9): 30 mg via ORAL
  Filled 2014-09-05 (×10): qty 1

## 2014-09-05 MED ORDER — ISOSORBIDE MONONITRATE ER 60 MG PO TB24: 60.0000 mg | ORAL_TABLET | Freq: Every day | ORAL | Status: DC

## 2014-09-05 NOTE — Progress Notes (Signed)
09/05/2014 4:18 PM Hemodialysis Outpatient Note; this patient's schedule has been changed to Monday, Wednesday and Friday 2nd shift.  Thank you. Gordy Savers

## 2014-09-05 NOTE — Progress Notes (Signed)
   Daily Progress Note  Vein mapping suggests L BC AVF vs stage L BVT.  Will see if OR space available for Thurs/Friday  Adele Barthel, MD [facility] Office: [phone] Pager: [phone]  09/05/2014, 7:59 AM

## 2014-09-05 NOTE — Progress Notes (Signed)
Patient ID: Carolyn Valenzuela, female   DOB: [birth year], 64 y.o.   MRN: [id number]  [facility] Progress Note   Assessment/ Plan:   1. End-stage renal disease from progressive chronic kidney disease-presentation with pulmonary edema/shortness of breath and poor response to diuretic therapy. Plan for hemodialysis again tomorrow to get her to an outpatient TTS schedule on which she has been accepted at [facility] and to allow for permanent access placement on Friday. 2. Volume overload/pulmonary edema: Status post 2 dialysis treatments over the past 2 days.  3. Anemia: On ESA and received 2 unit packed red cell transfusion yesterday dialysis. 4. Metabolic bone disease: Started on calcium carbonate for phosphorus binding 5. Metabolic acidosis: Improving on hemodialysis-sodium bicarbonate   Subjective:   Reports to have had a restless night due to noise in the hallways-looking forward to discharge after permanent access placed on Friday    Objective:   BP 176/66 mmHg  Pulse 76  Temp(Src) 98.4 F (36.9 C) (Oral)  Resp 18  Ht 5' (1.524 m)  Wt 60.737 kg (133 lb 14.4 oz)  BMI 26.15 kg/m2  SpO2 97%  Physical Exam: Gen: Comfortably sitting up on the side of her bed CVS: Pulse regular in rate and rhythm Resp: Clear to auscultation, no rales Abd: Soft, flat, nontender Ext: Status post right BKA, left leg without edema  Labs: BMET  Recent Labs Lab 09/01/14 1047 09/02/14 0330 09/03/14 0420 09/04/14 0336 09/05/14 0309  NA 133* 133* 129* 135  134* 140  K 4.8 4.2 4.3 3.9  3.9 3.7  CL 105 108 102 104  103 107  CO2 12* 18* 15* 22  22 26   GLUCOSE 305* 37* 120* 104*  104* 106*  BUN 71* 74* 72* 44*  45* 19  CREATININE 9.20* 9.12* 9.13* 6.78*  6.75* 4.37*  CALCIUM 7.4* 7.3* 7.3* 7.6*  7.6* 7.9*  PHOS  --  9.6* 8.1* 6.2* 4.0   CBC  Recent Labs Lab 09/01/14 1047 09/02/14 0330 09/03/14 0420 09/04/14 0835 09/05/14 0309  WBC 6.9 8.7 6.7 7.0 8.1   NEUTROABS 5.9  --   --   --   --   HGB 7.9* 7.0* 6.5* 7.4* 10.1*  HCT 23.7* 21.6* 19.6* 22.5* 30.0*  MCV 88.8 86.7 85.2 86.2 88.5  PLT 249 221 204 240 209   Medications:    . sodium chloride   Intravenous Once  . amLODipine  10 mg Oral Daily  . aspirin EC  81 mg Oral Daily  . calcium carbonate  400 mg of elemental calcium Oral TID WC  . cloNIDine  0.1 mg Oral BID  . ferrous sulfate  325 mg Oral Q breakfast  . gabapentin  100 mg Oral QHS  . heparin  5,000 Units Subcutaneous 3 times per day  . metroNIDAZOLE  500 mg Oral 3 times per day  . multivitamin with minerals  1 tablet Oral Daily  . sodium chloride  3 mL Intravenous Q12H   Elmarie Shiley, MD 09/05/2014, 10:04 AM

## 2014-09-05 NOTE — Progress Notes (Signed)
[facility] PROGRESS NOTE  Carolyn Valenzuela [id number] DOB: 01/03/1951 DOA: 09/01/2014 PCP: Cathlean Cower, MD  Assessment/Plan: 1. End-stage renal disease -Carolyn Valenzuela progressing to end-stage renal disease, presenting with pulmonary edema, minimally responsive diuretic therapy, lab work showing creatinine in the 9 point range. -Nephrology recommending starting renal replacement therapy -Vascular surgery was consulted, undergoing right internal jugular vein tunneled dialysis catheter placement, procedure performed on 09/03/2014 -Carolyn Valenzuela undergoing vein mapping today -Plan for permanent HD access placement on Thurs or Friday  2.  Pulmonary edema/shortness of breath -Likely secondary to end-stage renal disease -Carolyn Valenzuela was administered IV diuretics with poor response -Improved with RRT  3.  Hyponatremia -Lab work showing a downward trend in sodium to 129 -IV secondary to volume overload in setting of end-stage renal disease -Now resolved, am labs showing Na of 140  4.  Hypoglycemia -Carolyn Valenzuela with history of type 2 diabetes mellitus had been on Amaryl, having probable decreased renal clearance of oral hypoglycemics in setting of end-stage renal disease -Continue sliding scale coverage  Code Status: Full code Family Communication: I spoke to her sister who is present at bedside Disposition Plan: Plan for hemodialysis today   Consultants:  Nephrology  Vascular surgery  Procedures:  Right internal jugular vein tunneled dialysis catheter placement   HPI/Subjective: Carolyn Valenzuela is a 64 year old female with a past medical history of near end-stage renal disease, type 2 diabetes mellitus, hypertension, status post right below-knee amputation who was admitted to the medicine service on 09/01/2014 presenting with complaints of shortness of breath, generalized weakness, have an overall functional decline. Lab work revealed further increase in her creatinine to 9.2 with BUN of 71. Her  potassium was within normal limits at 4.8. Carolyn Valenzuela was seen and evaluated by nephrology recommended initiating renal replacement therapy. Vascular surgery was consulted as she underwent right internal jugular vein tunneled dialysis catheter placement, procedure performed on 09/03/2014. Hemodialysis to be performed after procedure.  Objective: Filed Vitals:   09/05/14 1045  BP: 178/88  Pulse:   Temp:   Resp:     Intake/Output Summary (Last 24 hours) at 09/05/14 1422 Last data filed at 09/05/14 1411  Gross per 24 hour  Intake    919 ml  Output   2614 ml  Net  -1695 ml   Filed Weights   09/04/14 1344 09/04/14 1653 09/05/14 0500  Weight: 63.9 kg (140 lb 14 oz) 62.2 kg (137 lb 2 oz) 60.737 kg (133 lb 14.4 oz)    Exam:   General:  No acute distress, following commands  Cardiovascular: Regular rate and rhythm normal S1-S2  Respiratory: Normal respiratory effort, Carolyn Valenzuela having by basilar crackles, not using accessory muscles  Abdomen: Soft nontender nondistended  Musculoskeletal: Trace edema to left extremity, status post below the knee amputation 2 right lower extremity  Data Reviewed: Basic Metabolic Panel:  Recent Labs Lab 09/01/14 1047 09/02/14 0330 09/03/14 0420 09/04/14 0336 09/05/14 0309  NA 133* 133* 129* 135  134* 140  K 4.8 4.2 4.3 3.9  3.9 3.7  CL 105 108 102 104  103 107  CO2 12* 18* 15* 22  22 26   GLUCOSE 305* 37* 120* 104*  104* 106*  BUN 71* 74* 72* 44*  45* 19  CREATININE 9.20* 9.12* 9.13* 6.78*  6.75* 4.37*  CALCIUM 7.4* 7.3* 7.3* 7.6*  7.6* 7.9*  PHOS  --  9.6* 8.1* 6.2* 4.0   Liver Function Tests:  Recent Labs Lab 09/01/14 1047 09/02/14 0330 09/03/14 0420 09/04/14 0336 09/05/14 0309  AST 23  --   --   --   --  ALT 15  --   --   --   --   ALKPHOS 81  --   --   --   --   BILITOT 0.4  --   --   --   --   PROT 6.9  --   --   --   --   ALBUMIN 2.5* 2.1* 2.0* 1.9* 2.0*   No results for input(s): LIPASE, AMYLASE in the last 168  hours. No results for input(s): AMMONIA in the last 168 hours. CBC:  Recent Labs Lab 09/01/14 1047 09/02/14 0330 09/03/14 0420 09/04/14 0835 09/05/14 0309  WBC 6.9 8.7 6.7 7.0 8.1  NEUTROABS 5.9  --   --   --   --   HGB 7.9* 7.0* 6.5* 7.4* 10.1*  HCT 23.7* 21.6* 19.6* 22.5* 30.0*  MCV 88.8 86.7 85.2 86.2 88.5  PLT 249 221 204 240 209   Cardiac Enzymes: No results for input(s): CKTOTAL, CKMB, CKMBINDEX, TROPONINI in the last 168 hours. BNP (last 3 results) No results for input(s): PROBNP in the last 8760 hours. CBG:  Recent Labs Lab 09/04/14 0633 09/04/14 1118 09/04/14 2056 09/05/14 0549 09/05/14 1123  GLUCAP 91 158* 162* 106* 159*    Recent Results (from the past 240 hour(s))  Clostridium Difficile by PCR     Status: Abnormal   Collection Time: 09/02/14  7:59 AM  Result Value Ref Range Status   C difficile by pcr POSITIVE (A) NEGATIVE Final    Comment: CRITICAL RESULT CALLED TO, READ BACK BY AND VERIFIED WITH: E.MILIANO,RN 09/02/14 @1110  BY V.WILKINS      Studies: No results found.  Scheduled Meds: . sodium chloride   Intravenous Once  . amLODipine  10 mg Oral Daily  . aspirin EC  81 mg Oral Daily  . calcium carbonate  400 mg of elemental calcium Oral TID WC  . [START ON 09/06/2014]  ceFAZolin (ANCEF) IV  1 g Intravenous On Call  . cloNIDine  0.1 mg Oral BID  . ferrous sulfate  325 mg Oral Q breakfast  . gabapentin  100 mg Oral QHS  . heparin  5,000 Units Subcutaneous 3 times per day  . isosorbide mononitrate  30 mg Oral Daily  . metroNIDAZOLE  500 mg Oral 3 times per day  . multivitamin with minerals  1 tablet Oral Daily  . sodium chloride  3 mL Intravenous Q12H   Continuous Infusions:   Principal Problem:   Pulmonary edema Active Problems:   Essential hypertension   End stage renal disease   S/P BKA (below knee amputation)   Type 2 diabetes mellitus with ESRD (end-stage renal disease)   Anemia of renal disease    Time spent: 25  min    Kelvin Cellar  [facility] Pager [phone]. If 7PM-7AM, please contact night-coverage at [email or web address], password [facility] 09/05/2014, 2:22 PM  LOS: 4 days

## 2014-09-06 ENCOUNTER — Encounter: Payer: BC Managed Care – PPO | Admitting: Physical Therapy

## 2014-09-06 ENCOUNTER — Encounter (HOSPITAL_COMMUNITY)
Admission: EM | Disposition: A | Discharge status: Home Health | Payer: Self-pay | Source: Home / Self Care | Attending: Internal Medicine

## 2014-09-06 ENCOUNTER — Inpatient Hospital Stay (HOSPITAL_COMMUNITY): Payer: BC Managed Care – PPO | Admitting: Certified Registered Nurse Anesthetist

## 2014-09-06 ENCOUNTER — Telehealth: Payer: Self-pay | Admitting: Vascular Surgery

## 2014-09-06 DIAGNOSIS — N185 Chronic kidney disease, stage 5: Secondary | ICD-10-CM

## 2014-09-06 DIAGNOSIS — N2581 Secondary hyperparathyroidism of renal origin: Secondary | ICD-10-CM | POA: Insufficient documentation

## 2014-09-06 DIAGNOSIS — I10 Essential (primary) hypertension: Secondary | ICD-10-CM | POA: Insufficient documentation

## 2014-09-06 DIAGNOSIS — N186 End stage renal disease: Secondary | ICD-10-CM | POA: Insufficient documentation

## 2014-09-06 DIAGNOSIS — D689 Coagulation defect, unspecified: Secondary | ICD-10-CM | POA: Insufficient documentation

## 2014-09-06 DIAGNOSIS — M5412 Radiculopathy, cervical region: Secondary | ICD-10-CM | POA: Insufficient documentation

## 2014-09-06 DIAGNOSIS — Z111 Encounter for screening for respiratory tuberculosis: Secondary | ICD-10-CM | POA: Insufficient documentation

## 2014-09-06 DIAGNOSIS — N189 Chronic kidney disease, unspecified: Secondary | ICD-10-CM | POA: Insufficient documentation

## 2014-09-06 DIAGNOSIS — E785 Hyperlipidemia, unspecified: Secondary | ICD-10-CM | POA: Insufficient documentation

## 2014-09-06 HISTORY — PX: AV FISTULA PLACEMENT: SHX1204

## 2014-09-06 LAB — RENAL FUNCTION PANEL
Albumin: 2.1 g/dL — ABNORMAL LOW (ref 3.5–5.2)
Anion gap: 7 (ref 5–15)
BUN: 22 mg/dL (ref 6–23)
CO2: 26 mmol/L (ref 19–32)
Calcium: 8.3 mg/dL — ABNORMAL LOW (ref 8.4–10.5)
Chloride: 105 mEq/L (ref 96–112)
Creatinine, Ser: 5.37 mg/dL — ABNORMAL HIGH (ref 0.50–1.10)
GFR calc Af Amer: 9 mL/min — ABNORMAL LOW (ref >90)
GFR calc non Af Amer: 8 mL/min — ABNORMAL LOW (ref >90)
Glucose, Bld: 174 mg/dL — ABNORMAL HIGH (ref 70–99)
Phosphorus: 4.2 mg/dL (ref 2.3–4.6)
Potassium: 3.6 mmol/L (ref 3.5–5.1)
Sodium: 138 mmol/L (ref 135–145)

## 2014-09-06 LAB — CBC
HCT: 31.4 % — ABNORMAL LOW (ref 36.0–46.0)
Hemoglobin: 10.1 g/dL — ABNORMAL LOW (ref 12.0–15.0)
MCH: 28.6 pg (ref 26.0–34.0)
MCHC: 32.2 g/dL (ref 30.0–36.0)
MCV: 89 fL (ref 78.0–100.0)
Platelets: 242 10*3/uL (ref 150–400)
RBC: 3.53 MIL/uL — ABNORMAL LOW (ref 3.87–5.11)
RDW: 15.3 % (ref 11.5–15.5)
WBC: 8.8 10*3/uL (ref 4.0–10.5)

## 2014-09-06 LAB — GLUCOSE, CAPILLARY
Glucose-Capillary: 158 mg/dL — ABNORMAL HIGH (ref 70–99)
Glucose-Capillary: 145 mg/dL — ABNORMAL HIGH (ref 70–99)
Glucose-Capillary: 142 mg/dL — ABNORMAL HIGH (ref 70–99)
Glucose-Capillary: 126 mg/dL — ABNORMAL HIGH (ref 70–99)

## 2014-09-06 SURGERY — ARTERIOVENOUS (AV) FISTULA CREATION
Anesthesia: General | Site: Arm Upper | Laterality: Left

## 2014-09-06 MED ORDER — MIDAZOLAM HCL 2 MG/2ML IJ SOLN
INTRAMUSCULAR | Status: AC
  Filled 2014-09-06: qty 2

## 2014-09-06 MED ORDER — FENTANYL CITRATE 0.05 MG/ML IJ SOLN
INTRAMUSCULAR | Status: AC
  Filled 2014-09-06: qty 5

## 2014-09-06 MED ORDER — ONDANSETRON HCL 4 MG/2ML IJ SOLN
INTRAMUSCULAR | Status: DC | PRN
  Administered 2014-09-06: 4 mg via INTRAVENOUS

## 2014-09-06 MED ORDER — SODIUM CHLORIDE 0.9 % IR SOLN
Status: DC | PRN
  Administered 2014-09-06: 12:00:00

## 2014-09-06 MED ORDER — SODIUM CHLORIDE 0.9 % IV SOLN
INTRAVENOUS | Status: DC | PRN
  Administered 2014-09-06: 11:00:00 via INTRAVENOUS

## 2014-09-06 MED ORDER — CEFAZOLIN SODIUM 1-5 GM-% IV SOLN
1.0000 g | INTRAVENOUS | Status: AC
  Administered 2014-09-06: 2 g via INTRAVENOUS
  Filled 2014-09-06: qty 50

## 2014-09-06 MED ORDER — PROPOFOL 10 MG/ML IV BOLUS
INTRAVENOUS | Status: DC | PRN
  Administered 2014-09-06: 100 mg via INTRAVENOUS

## 2014-09-06 MED ORDER — LIDOCAINE-EPINEPHRINE (PF) 1 %-1:200000 IJ SOLN
INTRAMUSCULAR | Status: AC
  Filled 2014-09-06: qty 10

## 2014-09-06 MED ORDER — THROMBIN 20000 UNITS EX SOLR
CUTANEOUS | Status: AC
  Filled 2014-09-06: qty 20000

## 2014-09-06 MED ORDER — PROPOFOL 10 MG/ML IV BOLUS
INTRAVENOUS | Status: AC
  Filled 2014-09-06: qty 40

## 2014-09-06 MED ORDER — FENTANYL CITRATE 0.05 MG/ML IJ SOLN
INTRAMUSCULAR | Status: DC | PRN
  Administered 2014-09-06: 50 ug via INTRAVENOUS
  Administered 2014-09-06: 100 ug via INTRAVENOUS
  Administered 2014-09-06: 50 ug via INTRAVENOUS

## 2014-09-06 MED ORDER — 0.9 % SODIUM CHLORIDE (POUR BTL) OPTIME
TOPICAL | Status: DC | PRN
  Administered 2014-09-06: 1000 mL

## 2014-09-06 MED ORDER — LIDOCAINE HCL (CARDIAC) 20 MG/ML IV SOLN
INTRAVENOUS | Status: DC | PRN
  Administered 2014-09-06: 40 mg via INTRAVENOUS

## 2014-09-06 MED ORDER — SUCCINYLCHOLINE CHLORIDE 20 MG/ML IJ SOLN
INTRAMUSCULAR | Status: DC | PRN
  Administered 2014-09-06: 100 mg via INTRAVENOUS

## 2014-09-06 MED ORDER — HEPARIN SODIUM (PORCINE) 1000 UNIT/ML DIALYSIS: 40.0000 [IU]/kg | INTRAMUSCULAR | Status: DC | PRN

## 2014-09-06 MED ORDER — HEPARIN SODIUM (PORCINE) 5000 UNIT/ML IJ SOLN
5000.0000 [IU] | Freq: Three times a day (TID) | INTRAMUSCULAR | Status: DC
  Administered 2014-09-06 – 2014-09-13 (×19): 5000 [IU] via SUBCUTANEOUS
  Filled 2014-09-06 (×24): qty 1

## 2014-09-06 MED ORDER — MIDAZOLAM HCL 5 MG/5ML IJ SOLN
INTRAMUSCULAR | Status: DC | PRN
  Administered 2014-09-06: 2 mg via INTRAVENOUS

## 2014-09-06 MED ORDER — TRAMADOL HCL 50 MG PO TABS
50.0000 mg | ORAL_TABLET | Freq: Four times a day (QID) | ORAL | Status: DC | PRN
  Administered 2014-09-09 – 2014-09-12 (×3): 50 mg via ORAL
  Filled 2014-09-06 (×3): qty 1

## 2014-09-06 SURGICAL SUPPLY — 30 items
ARMBAND PINK RESTRICT EXTREMIT (MISCELLANEOUS) ×2 IMPLANT
BLADE SURG 10 STRL SS (BLADE) ×2 IMPLANT
CANISTER SUCTION 2500CC (MISCELLANEOUS) ×2 IMPLANT
CLIP TI MEDIUM 6 (CLIP) ×2 IMPLANT
CLIP TI WIDE RED SMALL 6 (CLIP) ×2 IMPLANT
COVER PROBE W GEL 5X96 (DRAPES) ×2 IMPLANT
COVER SURGICAL LIGHT HANDLE (MISCELLANEOUS) ×2 IMPLANT
DECANTER SPIKE VIAL GLASS SM (MISCELLANEOUS) IMPLANT
ELECT REM PT RETURN 9FT ADLT (ELECTROSURGICAL) ×2
ELECTRODE REM PT RTRN 9FT ADLT (ELECTROSURGICAL) ×1 IMPLANT
GLOVE BIO SURGEON STRL SZ7 (GLOVE) ×2 IMPLANT
GLOVE BIOGEL PI IND STRL 7.5 (GLOVE) ×1 IMPLANT
GLOVE BIOGEL PI INDICATOR 7.5 (GLOVE) ×1
GOWN STRL REUS W/ TWL LRG LVL3 (GOWN DISPOSABLE) ×3 IMPLANT
GOWN STRL REUS W/TWL LRG LVL3 (GOWN DISPOSABLE) ×6
KIT BASIN OR (CUSTOM PROCEDURE TRAY) ×2 IMPLANT
KIT ROOM TURNOVER OR (KITS) ×2 IMPLANT
LIQUID BAND (GAUZE/BANDAGES/DRESSINGS) ×2 IMPLANT
NS IRRIG 1000ML POUR BTL (IV SOLUTION) ×2 IMPLANT
PACK CV ACCESS (CUSTOM PROCEDURE TRAY) ×2 IMPLANT
PAD ARMBOARD 7.5X6 YLW CONV (MISCELLANEOUS) ×4 IMPLANT
PROBE PENCIL 8 MHZ STRL DISP (MISCELLANEOUS) IMPLANT
SPONGE SURGIFOAM ABS GEL 100 (HEMOSTASIS) IMPLANT
SUT MNCRL AB 4-0 PS2 18 (SUTURE) ×2 IMPLANT
SUT PROLENE 6 0 BV (SUTURE) IMPLANT
SUT PROLENE 7 0 BV 1 (SUTURE) ×2 IMPLANT
SUT VIC AB 3-0 SH 27 (SUTURE) ×2
SUT VIC AB 3-0 SH 27X BRD (SUTURE) ×1 IMPLANT
UNDERPAD 30X30 INCONTINENT (UNDERPADS AND DIAPERS) ×2 IMPLANT
WATER STERILE IRR 1000ML POUR (IV SOLUTION) ×2 IMPLANT

## 2014-09-06 NOTE — Anesthesia Preprocedure Evaluation (Signed)
Anesthesia Evaluation  Patient identified by MRN, date of birth, ID band Patient awake    Reviewed: NPO status , Patient's Chart, lab work & pertinent test results  History of Anesthesia Complications Negative for: history of anesthetic complications  Airway Mallampati: I  TM Distance: >3 FB Neck ROM: Full    Dental  (+) Edentulous Upper, Edentulous Lower, Dental Advisory Given   Pulmonary neg pulmonary ROS,    Pulmonary exam normal       Cardiovascular hypertension,     Neuro/Psych negative neurological ROS  negative psych ROS   GI/Hepatic negative GI ROS, Neg liver ROS,   Endo/Other  diabetes  Renal/GU ESRFRenal disease     Musculoskeletal   Abdominal   Peds  Hematology   Anesthesia Other Findings   Reproductive/Obstetrics                             Anesthesia Physical Anesthesia Plan  ASA: III  Anesthesia Plan: General   Post-op Pain Management:    Induction: Intravenous  Airway Management Planned: LMA  Additional Equipment:   Intra-op Plan:   Post-operative Plan: Extubation in OR  Informed Consent: I have reviewed the patients History and Physical, chart, labs and discussed the procedure including the risks, benefits and alternatives for the proposed anesthesia with the patient or authorized representative who has indicated his/her understanding and acceptance.   Dental advisory given  Plan Discussed with: CRNA, Anesthesiologist and Surgeon  Anesthesia Plan Comments:         Anesthesia Quick Evaluation

## 2014-09-06 NOTE — Anesthesia Postprocedure Evaluation (Signed)
Anesthesia Post Note  Patient: Carolyn Valenzuela  Procedure(s) Performed: Procedure(s) (LRB): ARTERIOVENOUS (AV) FISTULA CREATION-Left Brachiocephalic (Left)  Anesthesia type: general  Patient location: PACU  Post pain: Pain level controlled  Post assessment: Patient's Cardiovascular Status Stable  Last Vitals:  Filed Vitals:   09/06/14 1350  BP: 172/58  Pulse: 84  Temp:   Resp: 12    Post vital signs: Reviewed and stable  Level of consciousness: sedated  Complications: No apparent anesthesia complications

## 2014-09-06 NOTE — Progress Notes (Signed)
[facility] PROGRESS NOTE  Carolyn Valenzuela [id number] DOB: 01/18/1951 DOA: 09/01/2014 PCP: Cathlean Cower, MD  Assessment/Plan: 1. End-stage renal disease -Carolyn Valenzuela progressing to end-stage renal disease, presenting with pulmonary edema, minimally responsive diuretic therapy, lab work showing creatinine in the 9 point range. -Nephrology recommending starting renal replacement therapy -Vascular surgery was consulted, undergoing right internal jugular vein tunneled dialysis catheter placement, procedure performed on 09/03/2014 -09/06/2014 Carolyn Valenzuela taken to OR for placement of left brachiocephalic AV fistula placement  -Will undergo HD today -Plan for outpatient dialysis Tue, Thurs, Sat  2.  Pulmonary edema/shortness of breath -Likely secondary to end-stage renal disease -Carolyn Valenzuela was administered IV diuretics with poor response -Improved with RRT  3.  Hyponatremia -Lab work showing a downward trend in sodium to 129 -IV secondary to volume overload in setting of end-stage renal disease -Resolved  4.  Hypoglycemia -Carolyn Valenzuela with history of type 2 diabetes mellitus had been on Amaryl, having probable decreased renal clearance of oral hypoglycemics in setting of end-stage renal disease -Continue sliding scale coverage, blood sugars in the 140's to 150's today  Code Status: Full code Family Communication: I spoke to Carolyn Valenzuela over telephone Disposition Plan: Plan for discharge home with [name] when medically stable   Consultants:  Nephrology  Vascular surgery  Procedures:  Right internal jugular vein tunneled dialysis catheter placement   HPI/Subjective: Carolyn Valenzuela is a 64 year old female with a past medical history of near end-stage renal disease, type 2 diabetes mellitus, hypertension, status post right below-knee amputation who was admitted to the medicine service on 09/01/2014 presenting with complaints of shortness of breath, generalized weakness, have an overall functional decline.  Lab work revealed further increase in Carolyn creatinine to 9.2 with BUN of 71. Carolyn potassium was within normal limits at 4.8. Carolyn Valenzuela was seen and evaluated by nephrology recommended initiating renal replacement therapy. Vascular surgery was consulted as she underwent right internal jugular vein tunneled dialysis catheter placement, procedure performed on 09/03/2014. Hemodialysis to be performed after procedure.  Objective: Filed Vitals:   09/06/14 1335  BP: 163/57  Pulse: 82  Temp:   Resp: 10    Intake/Output Summary (Last 24 hours) at 09/06/14 1347 Last data filed at 09/06/14 1305  Gross per 24 hour  Intake    640 ml  Output    350 ml  Net    290 ml   Filed Weights   09/04/14 1653 09/05/14 0500 09/06/14 0602  Weight: 62.2 kg (137 lb 2 oz) 60.737 kg (133 lb 14.4 oz) 60 kg (132 lb 4.4 oz)    Exam:   General:  No acute distress, following commands  Cardiovascular: Regular rate and rhythm normal S1-S2  Respiratory: Normal respiratory effort, Carolyn Valenzuela having by basilar crackles, not using accessory muscles  Abdomen: Soft nontender nondistended  Musculoskeletal: Trace edema to left extremity, status post below the knee amputation 2 right lower extremity  Data Reviewed: Basic Metabolic Panel:  Recent Labs Lab 09/02/14 0330 09/03/14 0420 09/04/14 0336 09/05/14 0309 09/06/14 0358  NA 133* 129* 135  134* 140 138  K 4.2 4.3 3.9  3.9 3.7 3.6  CL 108 102 104  103 107 105  CO2 18* 15* 22  22 26 26   GLUCOSE 37* 120* 104*  104* 106* 174*  BUN 74* 72* 44*  45* 19 22  CREATININE 9.12* 9.13* 6.78*  6.75* 4.37* 5.37*  CALCIUM 7.3* 7.3* 7.6*  7.6* 7.9* 8.3*  PHOS 9.6* 8.1* 6.2* 4.0 4.2   Liver Function Tests:  Recent Labs  Lab 09/01/14 1047 09/02/14 0330 09/03/14 0420 09/04/14 0336 09/05/14 0309 09/06/14 0358  AST 23  --   --   --   --   --   ALT 15  --   --   --   --   --   ALKPHOS 81  --   --   --   --   --   BILITOT 0.4  --   --   --   --   --   PROT 6.9  --    --   --   --   --   ALBUMIN 2.5* 2.1* 2.0* 1.9* 2.0* 2.1*   No results for input(s): LIPASE, AMYLASE in the last 168 hours. No results for input(s): AMMONIA in the last 168 hours. CBC:  Recent Labs Lab 09/01/14 1047 09/02/14 0330 09/03/14 0420 09/04/14 0835 09/05/14 0309 09/06/14 0358  WBC 6.9 8.7 6.7 7.0 8.1 8.8  NEUTROABS 5.9  --   --   --   --   --   HGB 7.9* 7.0* 6.5* 7.4* 10.1* 10.1*  HCT 23.7* 21.6* 19.6* 22.5* 30.0* 31.4*  MCV 88.8 86.7 85.2 86.2 88.5 89.0  PLT 249 221 204 240 209 242   Cardiac Enzymes: No results for input(s): CKTOTAL, CKMB, CKMBINDEX, TROPONINI in the last 168 hours. BNP (last 3 results) No results for input(s): PROBNP in the last 8760 hours. CBG:  Recent Labs Lab 09/05/14 1728 09/05/14 2126 09/06/14 0625 09/06/14 1040 09/06/14 1322  GLUCAP 178* 289* 158* 145* 142*    Recent Results (from the past 240 hour(s))  Clostridium Difficile by PCR     Status: Abnormal   Collection Time: 09/02/14  7:59 AM  Result Value Ref Range Status   C difficile by pcr POSITIVE (A) NEGATIVE Final    Comment: CRITICAL RESULT CALLED TO, READ BACK BY AND VERIFIED WITH: E.MILIANO,RN 09/02/14 @1110  BY V.WILKINS      Studies: No results found.  Scheduled Meds: . [MAR Hold] amLODipine  10 mg Oral Daily  . [MAR Hold] aspirin EC  81 mg Oral Daily  . [MAR Hold] calcium carbonate  400 mg of elemental calcium Oral TID WC  . [MAR Hold] cloNIDine  0.1 mg Oral BID  . [MAR Hold] ferrous sulfate  325 mg Oral Q breakfast  . [MAR Hold] gabapentin  100 mg Oral QHS  . [MAR Hold] heparin  5,000 Units Subcutaneous 3 times per day  . [MAR Hold] isosorbide mononitrate  30 mg Oral Daily  . [MAR Hold] metroNIDAZOLE  500 mg Oral 3 times per day  . [MAR Hold] multivitamin with minerals  1 tablet Oral Daily  . [MAR Hold] sodium chloride  3 mL Intravenous Q12H   Continuous Infusions:   Principal Problem:   Pulmonary edema Active Problems:   Essential hypertension   End  stage renal disease   S/P BKA (below knee amputation)   Type 2 diabetes mellitus with ESRD (end-stage renal disease)   Anemia of renal disease    Time spent: 25 min    Kelvin Cellar  [facility] Pager [phone]. If 7PM-7AM, please contact night-coverage at [email or web address], password [facility] 09/06/2014, 1:47 PM  LOS: 5 days

## 2014-09-06 NOTE — Op Note (Signed)
    OPERATIVE NOTE   PROCEDURE: left brachiocephalic arteriovenous fistula placement  PRE-OPERATIVE DIAGNOSIS: end stage renal disease   POST-OPERATIVE DIAGNOSIS: same as above   SURGEON: Adele Barthel, MD  ASSISTANT(S): Leontine Locket, PAC   ANESTHESIA: general  ESTIMATED BLOOD LOSS: 50 cc  FINDING(S): Palpable weak thrill and radial pulse at end of case  SPECIMEN(S):  none  INDICATIONS:   Carolyn Valenzuela is a 64 y.o. female who presents with end stage renal disease.  The patient is scheduled for left brachiocephalic arteriovenous fistula placement.  The patient is aware the risks include but are not limited to: bleeding, infection, steal syndrome, nerve damage, ischemic monomelic neuropathy, failure to mature, and need for additional procedures.  The patient is aware of the risks of the procedure and elects to proceed forward.  DESCRIPTION: After full informed written consent was obtained from the patient, the patient was brought back to the operating room and placed supine upon the operating table.  Prior to induction, the patient received IV antibiotics.   After obtaining adequate anesthesia, the patient was then prepped and draped in the standard fashion for a left arm access procedure.  I turned my attention first to identifying the patient's cephalic vein and brachial artery.  Using SonoSite guidance, the location of these vessels were marked out on the skin.   I made a transverse incision at the level of the antecubitum and dissected through the subcutaneous tissue and fascia to gain exposure of the brachial artery.  This was noted to be 3 mm in diameter externally.  This was dissected out proximally and distally and controlled with vessel loops .  I then dissected out the cephalic vein.  This was noted to be 4 mm in diameter externally.  The distal segment of the vein was ligated with a  2-0 silk, and the vein was transected.  The proximal segment was interrogated with serial  dilators.  The vein accepted up to a 4 mm dilator without any difficulty.  I then instilled the heparinized saline into the vein and clamped it.  At this point, I reset my exposure of the brachial artery and placed the artery under tension proximally and distally.  I made an arteriotomy with a #11 blade, and then I extended the arteriotomy with a Potts scissor.  I injected heparinized saline proximal and distal to this arteriotomy.  The vein was then sewn to the artery in an end-to-side configuration with a running stitch of 7-0 Prolene.  Prior to completing this anastomosis, I allowed the vein and artery to backbleed.  There was no evidence of clot from any vessels.  I completed the anastomosis in the usual fashion and then released all vessel loops and clamps.  There was a weakly palpable thrill in the venous outflow, and there was weakly palpable radial pulse.  At this point, I irrigated out the surgical wound.  There was no further active bleeding.  The subcutaneous tissue was reapproximated with a running stitch of 3-0 Vicryl.  The skin was then reapproximated with a running subcuticular stitch of 4-0 Vicryl.  The skin was then cleaned, dried, and reinforced with Dermabond.  The patient tolerated this procedure well.    COMPLICATIONS: none  CONDITION: stable  Adele Barthel, MD [facility] Office: [phone] Pager: [phone]  09/06/2014, 12:48 PM

## 2014-09-06 NOTE — Transfer of Care (Signed)
Immediate Anesthesia Transfer of Care Note  Patient: Carolyn Valenzuela  Procedure(s) Performed: Procedure(s): ARTERIOVENOUS (AV) FISTULA CREATION-Left Brachiocephalic (Left)  Patient Location: PACU  Anesthesia Type:General  Level of Consciousness: awake, alert  and oriented  Airway & Oxygen Therapy: Patient Spontanous Breathing  Post-op Assessment: Report given to PACU RN  Post vital signs: Reviewed and stable  Complications: No apparent anesthesia complications

## 2014-09-06 NOTE — Telephone Encounter (Addendum)
-----   Message from Mena Goes, RN sent at 09/06/2014  2:15 PM EST ----- Regarding: Schedule   ----- Message -----    From: Gabriel Earing, PA-C    Sent: 09/06/2014  12:59 PM      To: [facility] Charge Pool  S/p left BC AVF 09/06/14.  F/u with Dr. Bridgett Larsson in 4 weeks.  Does not need duplex.  Thanks, Samantha  notified patient of post op appt. with dr. Bridgett Larsson on 10-05-14 8:30

## 2014-09-06 NOTE — Progress Notes (Signed)
After pt returned on unit from OR for Left fistula placement, gave report to dialysis nurse, as pt will be going to have hemodialysis.

## 2014-09-06 NOTE — Progress Notes (Signed)
Pt returned from dialysis, CDMD notified, and pt is showing on telemetry monitor.

## 2014-09-06 NOTE — Interval H&P Note (Signed)
History and Physical Interval Note:  09/06/2014 11:19 AM  [name]  has presented today for surgery, with the diagnosis of End Stage Renal Disease  N18.6  The various methods of treatment have been discussed with the patient and family. After consideration of risks, benefits and other options for treatment, the patient has consented to  Procedure(s): ARTERIOVENOUS (AV) FISTULA CREATION-Left Brachiocephalic (Left) as a surgical intervention .  The patient's history has been reviewed, patient examined, no change in status, stable for surgery.  I have reviewed the patient's chart and labs.  Questions were answered to the patient's satisfaction.     CHEN,BRIAN LIANG-YU

## 2014-09-06 NOTE — Progress Notes (Signed)
Pt returned from OR surgery, and CCMD was notified to place back on telemetry.

## 2014-09-06 NOTE — Progress Notes (Signed)
Per Dr. Bridgett Larsson, hemodialysis has been postponed for the am only r/t Left arm fistula placement at 1120 today.

## 2014-09-07 ENCOUNTER — Telehealth: Payer: Self-pay | Admitting: Vascular Surgery

## 2014-09-07 ENCOUNTER — Encounter (HOSPITAL_COMMUNITY): Payer: Self-pay | Admitting: Vascular Surgery

## 2014-09-07 ENCOUNTER — Inpatient Hospital Stay (HOSPITAL_COMMUNITY): Payer: BC Managed Care – PPO

## 2014-09-07 DIAGNOSIS — R509 Fever, unspecified: Secondary | ICD-10-CM

## 2014-09-07 LAB — RENAL FUNCTION PANEL
Albumin: 2.2 g/dL — ABNORMAL LOW (ref 3.5–5.2)
Anion gap: 11 (ref 5–15)
BUN: 8 mg/dL (ref 6–23)
CO2: 22 mmol/L (ref 19–32)
Calcium: 7.9 mg/dL — ABNORMAL LOW (ref 8.4–10.5)
Chloride: 104 mEq/L (ref 96–112)
Creatinine, Ser: 3.56 mg/dL — ABNORMAL HIGH (ref 0.50–1.10)
GFR calc Af Amer: 15 mL/min — ABNORMAL LOW (ref >90)
GFR calc non Af Amer: 13 mL/min — ABNORMAL LOW (ref >90)
Glucose, Bld: 89 mg/dL (ref 70–99)
Phosphorus: 3 mg/dL (ref 2.3–4.6)
Potassium: 4.4 mmol/L (ref 3.5–5.1)
Sodium: 137 mmol/L (ref 135–145)

## 2014-09-07 LAB — GLUCOSE, CAPILLARY
Glucose-Capillary: 97 mg/dL (ref 70–99)
Glucose-Capillary: 116 mg/dL — ABNORMAL HIGH (ref 70–99)
Glucose-Capillary: 172 mg/dL — ABNORMAL HIGH (ref 70–99)
Glucose-Capillary: 154 mg/dL — ABNORMAL HIGH (ref 70–99)

## 2014-09-07 LAB — URINALYSIS, ROUTINE W REFLEX MICROSCOPIC
Bilirubin Urine: NEGATIVE
Glucose, UA: 500 mg/dL — AB
Ketones, ur: 15 mg/dL — AB
Leukocytes, UA: NEGATIVE
Nitrite: NEGATIVE
Protein, ur: >300 mg/dL — AB
Specific Gravity, Urine: 1.026 (ref 1.005–1.030)
Urobilinogen, UA: 0.2 mg/dL (ref 0.0–1.0)
pH: 7.5 (ref 5.0–8.0)

## 2014-09-07 LAB — URINE MICROSCOPIC-ADD ON

## 2014-09-07 MED ORDER — RENA-VITE PO TABS
1.0000 | ORAL_TABLET | Freq: Every day | ORAL | Status: DC
  Administered 2014-09-08 – 2014-09-12 (×6): 1 via ORAL
  Filled 2014-09-07 (×8): qty 1

## 2014-09-07 MED ORDER — PIPERACILLIN-TAZOBACTAM IN DEX 2-0.25 GM/50ML IV SOLN
2.2500 g | Freq: Three times a day (TID) | INTRAVENOUS | Status: DC
  Administered 2014-09-07 – 2014-09-12 (×14): 2.25 g via INTRAVENOUS
  Filled 2014-09-07 (×18): qty 50

## 2014-09-07 MED ORDER — VANCOMYCIN HCL 10 G IV SOLR
1250.0000 mg | Freq: Once | INTRAVENOUS | Status: AC
  Administered 2014-09-07: 1250 mg via INTRAVENOUS
  Filled 2014-09-07: qty 1250

## 2014-09-07 MED ORDER — SODIUM CHLORIDE 0.9 % IV SOLN
500.0000 mg | INTRAVENOUS | Status: DC
  Administered 2014-09-08 – 2014-09-10 (×2): 500 mg via INTRAVENOUS
  Filled 2014-09-07 (×5): qty 500

## 2014-09-07 NOTE — Progress Notes (Signed)
ANTIBIOTIC CONSULT NOTE - INITIAL  Pharmacy Consult:  Vancomycin / Zosyn Indication:  Empiric for fever   Allergies  Allergen Reactions  . Oxycodone Itching    Patient Measurements: Height: 5' (152.4 cm) Weight: 127 lb 6.8 oz (57.8 kg) IBW/kg (Calculated) : 45.5  Vital Signs: Temp: 99 F (37.2 C) (01/08 1501) Temp Source: Oral (01/08 1501) BP: 146/62 mmHg (01/08 1501) Pulse Rate: 75 (01/08 1501) Intake/Output from previous day: 01/07 0701 - 01/08 0700 In: 601.9 [P.O.:120; I.V.:331.9; IV Piggyback:150] Out: [id number] [Urine:350] Intake/Output from this shift: Total I/O In: 784 [P.O.:784] Out: 0   Labs:  Recent Labs  09/05/14 0309 09/06/14 0358 09/07/14 0415  WBC 8.1 8.8  --   HGB 10.1* 10.1*  --   PLT 209 242  --   CREATININE 4.37* 5.37* 3.56*   Estimated Creatinine Clearance: 13 mL/min (by C-G formula based on Cr of 3.56). No results for input(s): VANCOTROUGH, VANCOPEAK, VANCORANDOM, GENTTROUGH, GENTPEAK, GENTRANDOM, TOBRATROUGH, TOBRAPEAK, TOBRARND, AMIKACINPEAK, AMIKACINTROU, AMIKACIN in the last 72 hours.   Microbiology: Recent Results (from the past 720 hour(s))  Clostridium Difficile by PCR     Status: Abnormal   Collection Time: 09/02/14  7:59 AM  Result Value Ref Range Status   C difficile by pcr POSITIVE (A) NEGATIVE Final    Comment: CRITICAL RESULT CALLED TO, READ BACK BY AND VERIFIED WITH: E.MILIANO,RN 09/02/14 @1110  BY V.WILKINS     Medical History: Past Medical History  Diagnosis Date  . DIABETES MELLITUS, UNCONTROLLED 05/21/2009  . HYPERLIPIDEMIA 03/30/2007  . ANEMIA-IRON DEFICIENCY 01/25/2008  . HYPERTENSION 01/25/2008  . SINUSITIS- ACUTE-NOS 01/25/2008  . SECONDARY HYPERPARATHYROIDISM 05/21/2009  . RENAL INSUFFICIENCY 02/01/2008  . CERVICAL RADICULOPATHY, LEFT 01/25/2008  . BACK PAIN 05/02/2009  . NUMBNESS 05/21/2009  . Allergic rhinitis, cause unspecified 12/08/2010  . Foot ulcer     right lateral malleolus  . Critical lower limb ischemia        Assessment: 36 YOF with ESRD and new fever to start empiric vancomycin and Zosyn.  It appears that patient still has some residual renal function given documented UOP of 0.3 ml/kg/hr.  Patient continues on Flagyl for +C.diff.  Vanc 1/8 >> Zosyn 1/8 >> Ancef 1/7 >> 1/7 Flagyl 1/3 >>  1/8 BCx x2 - 1/3 C.diff PCR - positive   Goal of Therapy:  Vanc pre-HD level:  15-25 mcg/mL   Plan:  - Vanc 1250mg  IV x 1, then 500mg  IV q-HD MWF - Zosyn 2.25gm IV Q8H - Monitor HD scheduled/tolerance, micro data, vanc level as indicated     Carolyn Valenzuela D. Mina Marble, PharmD, BCPS Pager:  [phone] 09/07/2014, 3:29 PM

## 2014-09-07 NOTE — Progress Notes (Signed)
[facility] PROGRESS NOTE  Carolyn Valenzuela [id number] DOB: 19-Dec-1950 DOA: 09/01/2014 PCP: Cathlean Cower, MD  Assessment/Plan: 1. End-stage renal disease -Patient progressing to end-stage renal disease, presenting with pulmonary edema, minimally responsive diuretic therapy, lab work showing creatinine in the 9 point range. -Nephrology recommending starting renal replacement therapy -Vascular surgery was consulted, undergoing right internal jugular vein tunneled dialysis catheter placement, procedure performed on 09/03/2014 -09/06/2014 patient taken to OR for placement of left brachiocephalic AV fistula placement  -Discussed case with social work, will have outpatient HD on Mondays, Wednesdays and Fridays.   2.  Febrile Illness -Discharge had been planned for today, however, she developed a fever of 101.3, 100.7 on recheck -She is nontoxic appearing, hemodynamically stable, actually reports feeling well and was surprised to find out she had a fever.  -Will need to hold discharge. On HD which places her at a risk for infection -Blood cultures, urine analysis, chest x-ray ordered. -Will cover with emperic IV antibiotics after getting blood cultures with vancomycin and zosyn    3.  Pulmonary edema/shortness of breath -Resolved -Improved with RRT  4.  Hyponatremia -Lab work showing a downward trend in sodium to 129 -IV secondary to volume overload in setting of end-stage renal disease -Resolved  5.  Hypoglycemia -Patient with history of type 2 diabetes mellitus had been on Amaryl, having probable decreased renal clearance of oral hypoglycemics in setting of end-stage renal disease -Continue sliding scale coverage, blood sugars in the 140's to 150's today  Code Status: Full code Family Communication: I spoke to her sister over telephone Disposition Plan: Plan for discharge home with [facility] when medically stable   Consultants:  Nephrology  Vascular surgery  Procedures:  Right  internal jugular vein tunneled dialysis catheter placement   HPI/Subjective: Patient is a 64 year old female with a past medical history of near end-stage renal disease, type 2 diabetes mellitus, hypertension, status post right below-knee amputation who was admitted to the medicine service on 09/01/2014 presenting with complaints of shortness of breath, generalized weakness, have an overall functional decline. Lab work revealed further increase in her creatinine to 9.2 with BUN of 71. Her potassium was within normal limits at 4.8. Patient was seen and evaluated by nephrology recommended initiating renal replacement therapy. Vascular surgery was consulted as she underwent right internal jugular vein tunneled dialysis catheter placement, procedure performed on 09/03/2014. Hemodialysis to be performed after procedure.  Objective: Filed Vitals:   09/07/14 1501  BP: 146/62  Pulse: 75  Temp: 99 F (37.2 C)  Resp: 18    Intake/Output Summary (Last 24 hours) at 09/07/14 1504 Last data filed at 09/07/14 1344  Gross per 24 hour  Intake 965.85 ml  Output   3294 ml  Net -2328.15 ml   Filed Weights   09/06/14 1455 09/06/14 1830 09/07/14 0421  Weight: 63.1 kg (139 lb 1.8 oz) 57.3 kg (126 lb 5.2 oz) 57.8 kg (127 lb 6.8 oz)    Exam:   General:  No acute distress, following commands  Cardiovascular: Regular rate and rhythm normal S1-S2  Respiratory: Normal respiratory effort, patient having by basilar crackles, not using accessory muscles  Abdomen: Soft nontender nondistended  Musculoskeletal: Trace edema to left extremity, status post below the knee amputation 2 right lower extremity  Data Reviewed: Basic Metabolic Panel:  Recent Labs Lab 09/03/14 0420 09/04/14 0336 09/05/14 0309 09/06/14 0358 09/07/14 0415  NA 129* 135  134* 140 138 137  K 4.3 3.9  3.9 3.7 3.6 4.4  CL  102 104  103 107 105 104  CO2 15* 22  22 26 26 22   GLUCOSE 120* 104*  104* 106* 174* 89  BUN 72* 44*   45* 19 22 8   CREATININE 9.13* 6.78*  6.75* 4.37* 5.37* 3.56*  CALCIUM 7.3* 7.6*  7.6* 7.9* 8.3* 7.9*  PHOS 8.1* 6.2* 4.0 4.2 3.0   Liver Function Tests:  Recent Labs Lab 09/01/14 1047  09/03/14 0420 09/04/14 0336 09/05/14 0309 09/06/14 0358 09/07/14 0415  AST 23  --   --   --   --   --   --   ALT 15  --   --   --   --   --   --   ALKPHOS 81  --   --   --   --   --   --   BILITOT 0.4  --   --   --   --   --   --   PROT 6.9  --   --   --   --   --   --   ALBUMIN 2.5*  < > 2.0* 1.9* 2.0* 2.1* 2.2*  < > = values in this interval not displayed. No results for input(s): LIPASE, AMYLASE in the last 168 hours. No results for input(s): AMMONIA in the last 168 hours. CBC:  Recent Labs Lab 09/01/14 1047 09/02/14 0330 09/03/14 0420 09/04/14 0835 09/05/14 0309 09/06/14 0358  WBC 6.9 8.7 6.7 7.0 8.1 8.8  NEUTROABS 5.9  --   --   --   --   --   HGB 7.9* 7.0* 6.5* 7.4* 10.1* 10.1*  HCT 23.7* 21.6* 19.6* 22.5* 30.0* 31.4*  MCV 88.8 86.7 85.2 86.2 88.5 89.0  PLT 249 221 204 240 209 242   Cardiac Enzymes: No results for input(s): CKTOTAL, CKMB, CKMBINDEX, TROPONINI in the last 168 hours. BNP (last 3 results) No results for input(s): PROBNP in the last 8760 hours. CBG:  Recent Labs Lab 09/06/14 1040 09/06/14 1322 09/06/14 2107 09/07/14 0706 09/07/14 1111  GLUCAP 145* 142* 126* 97 116*    Recent Results (from the past 240 hour(s))  Clostridium Difficile by PCR     Status: Abnormal   Collection Time: 09/02/14  7:59 AM  Result Value Ref Range Status   C difficile by pcr POSITIVE (A) NEGATIVE Final    Comment: CRITICAL RESULT CALLED TO, READ BACK BY AND VERIFIED WITH: E.MILIANO,RN 09/02/14 @1110  BY V.WILKINS      Studies: No results found.  Scheduled Meds: . amLODipine  10 mg Oral Daily  . aspirin EC  81 mg Oral Daily  . calcium carbonate  400 mg of elemental calcium Oral TID WC  . cloNIDine  0.1 mg Oral BID  . ferrous sulfate  325 mg Oral Q breakfast  .  gabapentin  100 mg Oral QHS  . heparin  5,000 Units Subcutaneous 3 times per day  . isosorbide mononitrate  30 mg Oral Daily  . metroNIDAZOLE  500 mg Oral 3 times per day  . multivitamin  1 tablet Oral QHS  . sodium chloride  3 mL Intravenous Q12H   Continuous Infusions:   Principal Problem:   Pulmonary edema Active Problems:   Essential hypertension   End stage renal disease   S/P BKA (below knee amputation)   Type 2 diabetes mellitus with ESRD (end-stage renal disease)   Anemia of renal disease    Time spent:30 min    Revis Whalin  [facility] Pager  [phone]. If 7PM-7AM, please contact night-coverage at [email or web address], password [facility] 09/07/2014, 3:04 PM  LOS: 6 days

## 2014-09-07 NOTE — Progress Notes (Signed)
Attempted to meet with patient this afternoon but she is off the unit in dialysis per nursing.  A list of transportation options left in her room and will follow up tomorrow.  Lorie Phenix. Pauline Good, [place]

## 2014-09-07 NOTE — Progress Notes (Signed)
UR completed Kayvan Hoefling K. Yaritzel Stange, RN, BSN, [place], CCM  09/07/2014 3:44 PM

## 2014-09-07 NOTE — Progress Notes (Signed)
Patient ID: Carolyn Valenzuela, female   DOB: 07/01/1951, 64 y.o.   MRN: [id number]  [facility] Progress Note   Assessment/ Plan:   1. End-stage renal disease from progressive chronic kidney disease-presentation with pulmonary edema/shortness of breath and poor response to diuretic therapy. Hemodialysis done yesterday and accepted at [facility] on a Monday/Wednesday/Friday schedule-appropriate for discharge home today. She is status post creation of a left brachiocephalic fistula yesterday (Dr. Bridgett Larsson) and follow-up visit has been set up. 2. Volume overload/pulmonary edema: Improving with initiation of dialysis-continue efforts at challenging dry weight upon discharge/as an outpatient (estimated dry weight 56.5 kg)  3. Anemia: On ESA and received 2 unit packed red cell transfusion yesterday dialysis. 4. Metabolic bone disease: Started on calcium carbonate for phosphorus binding 5. Metabolic acidosis: Improving on hemodialysis-sodium bicarbonate discontinued  Subjective:   Reports to be feeling well-inquires about disposition    Objective:   BP 162/57 mmHg  Pulse 85  Temp(Src) 101.3 F (38.5 C) (Oral)  Resp 18  Ht 5' (1.524 m)  Wt 57.8 kg (127 lb 6.8 oz)  BMI 24.89 kg/m2  SpO2 100%  Physical Exam: Gen: Comfortably resting in bed CVS: Pulse regular in rate and rhythm Resp: Clear to auscultation bilaterally-no rales Abd: Soft, flat, nontender Ext: Status post right BKA, no edema left leg  Labs: BMET  Recent Labs Lab 09/01/14 1047 09/02/14 0330 09/03/14 0420 09/04/14 0336 09/05/14 0309 09/06/14 0358 09/07/14 0415  NA 133* 133* 129* 135  134* 140 138 137  K 4.8 4.2 4.3 3.9  3.9 3.7 3.6 4.4  CL 105 108 102 104  103 107 105 104  CO2 12* 18* 15* 22  22 26 26 22   GLUCOSE 305* 37* 120* 104*  104* 106* 174* 89  BUN 71* 74* 72* 44*  45* 19 22 8   CREATININE 9.20* 9.12* 9.13* 6.78*  6.75* 4.37* 5.37* 3.56*  CALCIUM 7.4* 7.3* 7.3* 7.6*  7.6* 7.9*  8.3* 7.9*  PHOS  --  9.6* 8.1* 6.2* 4.0 4.2 3.0   CBC  Recent Labs Lab 09/01/14 1047  09/03/14 0420 09/04/14 0835 09/05/14 0309 09/06/14 0358  WBC 6.9  < > 6.7 7.0 8.1 8.8  NEUTROABS 5.9  --   --   --   --   --   HGB 7.9*  < > 6.5* 7.4* 10.1* 10.1*  HCT 23.7*  < > 19.6* 22.5* 30.0* 31.4*  MCV 88.8  < > 85.2 86.2 88.5 89.0  PLT 249  < > 204 240 209 242  < > = values in this interval not displayed.  Medications:    . amLODipine  10 mg Oral Daily  . aspirin EC  81 mg Oral Daily  . calcium carbonate  400 mg of elemental calcium Oral TID WC  . cloNIDine  0.1 mg Oral BID  . ferrous sulfate  325 mg Oral Q breakfast  . gabapentin  100 mg Oral QHS  . heparin  5,000 Units Subcutaneous 3 times per day  . isosorbide mononitrate  30 mg Oral Daily  . metroNIDAZOLE  500 mg Oral 3 times per day  . multivitamin with minerals  1 tablet Oral Daily  . sodium chloride  3 mL Intravenous Q12H   Elmarie Shiley, MD 09/07/2014, 10:11 AM

## 2014-09-07 NOTE — Telephone Encounter (Addendum)
-----   Message from Carolyn Goes, RN sent at 09/07/2014  9:19 AM EST ----- Regarding: Schedule FYI- orders are in   ----- Message -----    From: Carolyn Asbury, MD    Sent: 09/07/2014   8:17 AM      To: [facility] [address] Carolyn Valenzuela [id number] 01/23/1951  Change follow up to 6 weeks   09/07/2014: moved appt out- lm for pt re appt change, dpm

## 2014-09-07 NOTE — Progress Notes (Signed)
CSW met with patient today to discuss possible issues with transportation.  She stated that her niece Laurena Spies has made arrangements for her to receive transportation through [employer]. She will received dialysis on a M-W-F, 2nd shift scheduled at [facility]. CSW discussed this with patient and she verbalized understanding of this. Also spoke via phone with niece Tammy who confirmed these arrangements. Patient stated that she lives alone and has strong family support from her niece and sister. She was hoping to return home but had an elevated temperature; she is hopeful to return home soon.  No further CSW needs identified.  CSW signing off.  Lorie Phenix. Pauline Good, Gay

## 2014-09-07 NOTE — Progress Notes (Signed)
   Daily Progress Note  Assessment/Planning: POD #1 s/p L BC AVF   No steal sx with functioning AVF  Follow up in 6 weeks in office  Subjective  - 1 Day Post-Op  No complaints  Objective Filed Vitals:   09/06/14 1850 09/06/14 1938 09/06/14 2230 09/07/14 0421  BP: 194/89 193/65 160/72 153/66  Pulse: 89 89 92 79  Temp:  99.3 F (37.4 C)  99.8 F (37.7 C)  TempSrc:  Oral  Oral  Resp: 14 18  18   Height:      Weight:    127 lb 6.8 oz (57.8 kg)  SpO2:  100%  100%    Intake/Output Summary (Last 24 hours) at 09/07/14 0816 Last data filed at 09/07/14 0422  Gross per 24 hour  Intake 601.85 ml  Output   3294 ml  Net -2692.15 ml    PULM  CTAB CV  RRR GI  soft, NTND VASC  L antecubital incision c/d/i, palpable thrill, +bruit, palpable radial pulse  Laboratory CBC    Component Value Date/Time   WBC 8.8 09/06/2014 0358   HGB 10.1* 09/06/2014 0358   HCT 31.4* 09/06/2014 0358   PLT 242 09/06/2014 0358    BMET    Component Value Date/Time   NA 137 09/07/2014 0415   K 4.4 09/07/2014 0415   CL 104 09/07/2014 0415   CO2 22 09/07/2014 0415   GLUCOSE 89 09/07/2014 0415   BUN 8 09/07/2014 0415   CREATININE 3.56* 09/07/2014 0415   CALCIUM 7.9* 09/07/2014 0415   CALCIUM 7.2* 08/07/2014 1406   GFRNONAA 13* 09/07/2014 0415   GFRAA 15* 09/07/2014 0415    Adele Barthel, MD [facility]: [phone] Pager: [phone]  09/07/2014, 8:16 AM

## 2014-09-08 DIAGNOSIS — N186 End stage renal disease: Secondary | ICD-10-CM | POA: Insufficient documentation

## 2014-09-08 DIAGNOSIS — N179 Acute kidney failure, unspecified: Secondary | ICD-10-CM

## 2014-09-08 DIAGNOSIS — Z992 Dependence on renal dialysis: Secondary | ICD-10-CM

## 2014-09-08 DIAGNOSIS — N189 Chronic kidney disease, unspecified: Secondary | ICD-10-CM

## 2014-09-08 LAB — CBC
HCT: 31 % — ABNORMAL LOW (ref 36.0–46.0)
Hemoglobin: 9.5 g/dL — ABNORMAL LOW (ref 12.0–15.0)
MCH: 28.2 pg (ref 26.0–34.0)
MCHC: 30.6 g/dL (ref 30.0–36.0)
MCV: 92 fL (ref 78.0–100.0)
Platelets: 207 10*3/uL (ref 150–400)
RBC: 3.37 MIL/uL — ABNORMAL LOW (ref 3.87–5.11)
RDW: 15.8 % — ABNORMAL HIGH (ref 11.5–15.5)
WBC: 11.3 10*3/uL — ABNORMAL HIGH (ref 4.0–10.5)

## 2014-09-08 LAB — RENAL FUNCTION PANEL
Albumin: 2 g/dL — ABNORMAL LOW (ref 3.5–5.2)
Anion gap: 7 (ref 5–15)
BUN: 13 mg/dL (ref 6–23)
CO2: 25 mmol/L (ref 19–32)
Calcium: 7.7 mg/dL — ABNORMAL LOW (ref 8.4–10.5)
Chloride: 103 mEq/L (ref 96–112)
Creatinine, Ser: 5.05 mg/dL — ABNORMAL HIGH (ref 0.50–1.10)
GFR calc Af Amer: 10 mL/min — ABNORMAL LOW (ref >90)
GFR calc non Af Amer: 8 mL/min — ABNORMAL LOW (ref >90)
Glucose, Bld: 154 mg/dL — ABNORMAL HIGH (ref 70–99)
Phosphorus: 4 mg/dL (ref 2.3–4.6)
Potassium: 4.2 mmol/L (ref 3.5–5.1)
Sodium: 135 mmol/L (ref 135–145)

## 2014-09-08 LAB — GLUCOSE, CAPILLARY
Glucose-Capillary: 128 mg/dL — ABNORMAL HIGH (ref 70–99)
Glucose-Capillary: 193 mg/dL — ABNORMAL HIGH (ref 70–99)
Glucose-Capillary: 139 mg/dL — ABNORMAL HIGH (ref 70–99)

## 2014-09-08 MED ORDER — GABAPENTIN 100 MG PO CAPS: 100.0000 mg | ORAL_CAPSULE | Freq: Every day | ORAL | Status: DC

## 2014-09-08 MED ORDER — ISOSORBIDE MONONITRATE ER 30 MG PO TB24: 30.0000 mg | ORAL_TABLET | Freq: Every day | ORAL | Status: DC

## 2014-09-08 MED ORDER — CLONIDINE HCL 0.1 MG PO TABS: 0.1000 mg | ORAL_TABLET | Freq: Two times a day (BID) | ORAL | Status: DC

## 2014-09-08 MED ORDER — GUAIFENESIN 100 MG/5ML PO SOLN
5.0000 mL | Freq: Four times a day (QID) | ORAL | Status: DC | PRN
  Administered 2014-09-08 – 2014-09-13 (×9): 100 mg via ORAL
  Filled 2014-09-08 (×13): qty 5

## 2014-09-08 MED ORDER — TRAMADOL HCL 50 MG PO TABS: 50.0000 mg | ORAL_TABLET | Freq: Four times a day (QID) | ORAL | Status: DC | PRN

## 2014-09-08 MED ORDER — CALCIUM CARBONATE ANTACID 500 MG PO CHEW: 400.0000 mg | CHEWABLE_TABLET | Freq: Three times a day (TID) | ORAL | Status: DC

## 2014-09-08 MED ORDER — INSULIN STARTER KIT- PEN NEEDLES (ENGLISH)
1.0000 | Freq: Once | Status: DC
  Filled 2014-09-08: qty 1

## 2014-09-08 MED ORDER — INSULIN GLARGINE 100 UNIT/ML ~~LOC~~ SOLN: 10.0000 [IU] | Freq: Every day | SUBCUTANEOUS | Status: DC

## 2014-09-08 MED ORDER — VANCOMYCIN HCL 500 MG IV SOLR
500.0000 mg | INTRAVENOUS | Status: AC
  Filled 2014-09-08: qty 500

## 2014-09-08 MED ORDER — MENTHOL 3 MG MT LOZG
1.0000 | LOZENGE | OROMUCOSAL | Status: DC | PRN
  Administered 2014-09-09: 3 mg via ORAL
  Filled 2014-09-08 (×2): qty 9

## 2014-09-08 MED ORDER — METRONIDAZOLE 500 MG PO TABS: 500.0000 mg | ORAL_TABLET | Freq: Three times a day (TID) | ORAL | Status: DC

## 2014-09-08 NOTE — Progress Notes (Signed)
The patient's temperature is 103.2 orally.  She was given Tylenol.  Fredirick Maudlin was notified.  The RN will continue to monitor the patient's temperature.

## 2014-09-08 NOTE — Procedures (Signed)
Patient seen on Hemodialysis. QB 400, UF goal 2.5L Treatment adjusted as needed.  Carolyn Shiley MD [facility]. Office # [phone] Pager # [phone] 4:23 PM

## 2014-09-08 NOTE — Discharge Summary (Addendum)
Physician Discharge Summary  Carolyn Valenzuela [id number] DOB: 1951-08-07 DOA: 09/01/2014  PCP: Cathlean Cower, MD  Admit date: 09/01/2014 Discharge date: 09/08/2014  Time spent: 35 minutes  Recommendations for Outpatient Follow-up:  1. Please follow up on repeat CXR in 1-2 weeks 2. Patient to under HD on M,W,F 3. Please follow up on blood sugars, she has a history of DM however did not require coverage during this hospitalizaiton. She reported taking Lantus in the past which caused hypoglycemia.   Discharge Diagnoses:  Principal Problem:   Pulmonary edema Active Problems:   Essential hypertension   End stage renal disease   S/P BKA (below knee amputation)   Type 2 diabetes mellitus with ESRD (end-stage renal disease)   Anemia of renal disease   Acute on chronic renal failure   ESRD needing dialysis   Discharge Condition: Stable  Diet recommendation: Heart Healthy  Filed Weights   09/07/14 0421 09/08/14 0555 09/08/14 1334  Weight: 57.8 kg (127 lb 6.8 oz) 56.885 kg (125 lb 6.5 oz) 58.9 kg (129 lb 13.6 oz)    History of present illness:  Carolyn Valenzuela is a 64 y.o. female  With history of near end-stage renal disease, diabetes, hypertension, right BKA reports she felt like her blood sugars were low this morning. She did not check them. Her sister reports speaking to the patient on the phone and she had difficulty speaking. No definite hypoglycemia reported by EMS. She was reportedly wheezing and received a nebulizer treatment en route. In the emergency room, her blood glucose was above 200. Chest x-ray showed pulmonary edema. Patient is followed by Dr. Moshe Cipro and has been trying to avoid dialysis. She lives alone. No chest pain or palpitations. Her sister reports that her face abdomen right breast, and left leg is swollen. She has been seen by nephrology who recommends starting dialysis but patient is not yet ready to consent. She has a nonfunctioning AV graft in her left arm. In  the emergency room, she was given 120 mg of IV Lasix.   Hospital Course:  Patient is a 64 year old female with a past medical history of near end-stage renal disease, type 2 diabetes mellitus, hypertension, status post right below-knee amputation who was admitted to the medicine service on 09/01/2014 presenting with complaints of shortness of breath, generalized weakness, have an overall functional decline. Lab work revealed further increase in her creatinine to 9.2 with BUN of 71. Her potassium was within normal limits at 4.8. Patient was seen and evaluated by nephrology recommended initiating renal replacement therapy. Vascular surgery was consulted as she underwent right internal jugular vein tunneled dialysis catheter placement, procedure performed on 09/03/2014. Hemodialysis to be performed after procedure.   End-stage renal disease -Patient progressing to end-stage renal disease, presenting with pulmonary edema, minimally responsive diuretic therapy, lab work showing creatinine in the 9 point range. -Nephrology recommending starting renal replacement therapy -Vascular surgery was consulted, undergoing right internal jugular vein tunneled dialysis catheter placement, procedure performed on 09/03/2014 -09/06/2014 patient taken to OR for placement of left brachiocephalic AV fistula placement  -Discussed case with social work, will have outpatient HD on Mondays, Wednesdays and Fridays.  -Case discussed with Dr Posey Pronto of Nephrology, [place] with discharge today after HD, will continue IV antibiotic therapy with HD.   2. Febrile Illness -Discharge had been planned for today, however, she developed a fever of 101.3 on 09/07/2013 -She was nontoxic appearing, hemodynamically stable, actually reports feeling well and was surprised to find out she had  a fever.  -Urinalysis was unremarkable however chest x-ray showed left greater than right opacities which could be related to atelectasis or perhaps underlying  infection -She was started on empiric IV antimicrobial therapy with vancomycin and Zosyn -Given the fact that her fevers came down, she was nontoxic, actually reporting feeling much better, she was discharged to her home with plans to receive empiric IV antimicrobial therapy with hemodialysis. This has been discussed with her nephrologist Dr. Posey Pronto who agreed with this plan.   3. Pulmonary edema/shortness of breath -Resolved -Improved with RRT  4. Hyponatremia -Resolved  5. Type II diabetes Mellitus with hypoglycemia -Patient with history of type 2 diabetes mellitus had been on Amaryl, having probable decreased renal clearance of oral hypoglycemics in setting of end-stage renal disease -Please follow up on blood sugars -She reported having hypoglycemia with Lantus in the past.  -Please follow up on blood sugars    Consultations:  Nephrology  Discharge Exam: Filed Vitals:   09/08/14 1716  BP: 148/94  Pulse: 104  Temp: 99.5 F (37.5 C)  Resp: 20     General: No acute distress, following commands  Cardiovascular: Regular rate and rhythm normal S1-S2  Respiratory: Normal respiratory effort, patient having by basilar crackles, not using accessory muscles  Abdomen: Soft nontender nondistended  Musculoskeletal: Trace edema to left extremity, status post below the knee amputation 2 right lower extremity  Discharge Instructions   Discharge Instructions    Call MD for:  difficulty breathing, headache or visual disturbances    Complete by:  As directed      Call MD for:  extreme fatigue    Complete by:  As directed      Call MD for:  hives    Complete by:  As directed      Call MD for:  persistant dizziness or light-headedness    Complete by:  As directed      Call MD for:  persistant nausea and vomiting    Complete by:  As directed      Call MD for:  redness, tenderness, or signs of infection (pain, swelling, redness, odor or green/yellow discharge around incision  site)    Complete by:  As directed      Call MD for:  severe uncontrolled pain    Complete by:  As directed      Call MD for:  temperature >100.4    Complete by:  As directed      Diet - low sodium heart healthy    Complete by:  As directed      Increase activity slowly    Complete by:  As directed           Current Discharge Medication List    START taking these medications   Details  calcium carbonate (TUMS - DOSED IN MG ELEMENTAL CALCIUM) 500 MG chewable tablet Chew 2 tablets (400 mg of elemental calcium total) by mouth 3 (three) times daily with meals. Qty: 60 tablet, Refills: 1    cloNIDine (CATAPRES) 0.1 MG tablet Take 1 tablet (0.1 mg total) by mouth 2 (two) times daily. Qty: 60 tablet, Refills: 11    isosorbide mononitrate (IMDUR) 30 MG 24 hr tablet Take 1 tablet (30 mg total) by mouth daily. Qty: 30 tablet, Refills: 1    metroNIDAZOLE (FLAGYL) 500 MG tablet Take 1 tablet (500 mg total) by mouth 3 (three) times daily. Qty: 24 tablet, Refills: 1    traMADol (ULTRAM) 50 MG tablet Take 1 tablet (  50 mg total) by mouth every 6 (six) hours as needed for moderate pain. Qty: 15 tablet, Refills: 0      CONTINUE these medications which have CHANGED   Details  gabapentin (NEURONTIN) 100 MG capsule Take 1 capsule (100 mg total) by mouth at bedtime. Qty: 30 capsule, Refills: 1      CONTINUE these medications which have NOT CHANGED   Details  amLODipine (NORVASC) 10 MG tablet TAKE ONE TABLET BY MOUTH ONCE DAILY Qty: 30 tablet, Refills: 6    aspirin 81 MG EC tablet Take 81 mg by mouth daily.      ferrous sulfate (FERROUSUL) 325 (65 FE) MG tablet Take 325 mg by mouth daily with breakfast.    Multiple Vitamin (MULTIVITAMIN WITH MINERALS) TABS tablet Take 1 tablet by mouth daily.      STOP taking these medications     furosemide (LASIX) 40 MG tablet      glimepiride (AMARYL) 4 MG tablet      carvedilol (COREG) 12.5 MG tablet        Allergies  Allergen Reactions  .  Oxycodone Itching   Follow-up Information    Follow up with Hinda Lenis, MD In 4 weeks.   Specialty:  Vascular Surgery   Why:  Office will call you to arrange your appt (sent)   Contact information:   [place] [address] [phone]       Follow up with Ulla Potash., MD.   Specialty:  Nephrology   Contact information:   [place] [address] [phone]       Follow up with Cathlean Cower, MD In 2 weeks.   Specialties:  Internal Medicine, Radiology   Contact information:   [address] [place] [address] [phone]        The results of significant diagnostics from this hospitalization (including imaging, microbiology, ancillary and laboratory) are listed below for reference.    Significant Diagnostic Studies: Dg Chest 2 View  09/07/2014   CLINICAL DATA:  Fever.  History of diabetes and hypertension.  EXAM: CHEST  2 VIEW  COMPARISON:  09/03/2014; 09/01/2014 ; 03/15/2014  FINDINGS: Grossly unchanged borderline enlarged cardiac silhouette and mediastinal contours. Stable position of support apparatus. There is persistent mild elevation the medial aspect of right hemidiaphragm. The pulmonary vasculature remains indistinct with cephalization of flow. Minimally improved aeration lungs with persistent bilateral infrahilar heterogeneous opacities, left greater than right. Unchanged small bilateral effusions. No pneumothorax. Unchanged bones.  IMPRESSION: 1. Persistent bilateral infrahilar heterogeneous opacities, left greater than right, possibly atelectasis though worrisome for underlying infection. A follow-up chest radiograph in 4 to 6 weeks after treatment is recommended to ensure resolution. 2. Minimally improved aeration of the lungs with persistent findings of mild pulmonary edema/pulmonary venous congestion.   Electronically Signed   By: Sandi Mariscal M.D.   On: 09/07/2014 17:37   Dg Chest 2 View  09/01/2014   CLINICAL DATA:  Cough,  wheezing and shortness of breath. Foreign body projected over the left chest at the time of portable chest x-ray earlier today.  EXAM: CHEST - 2 VIEW  COMPARISON:  Portable film earlier today at 1057 hr  FINDINGS: The radiopaque hair pin is no longer visualized and presumably has been removed. No foreign body is identified in the chest. Lungs show persistent mild interstitial prominence with associated cardiomegaly and small bilateral pleural effusions. Findings are consistent with interstitial edema. Bony structures are unremarkable.  IMPRESSION: No further visualized  hair pin overlying the mid left chest. Evidence of mild interstitial edema.   Electronically Signed   By: Aletta Edouard M.D.   On: 09/01/2014 12:56   Dg Chest Port 1 View  09/03/2014   CLINICAL DATA:  End-stage renal disease. Status post Diatek catheter placement.  EXAM: PORTABLE CHEST - 1 VIEW  COMPARISON:  09/01/2014  FINDINGS: Right internal jugular Diatek catheter noted with distal tip in the right atrium and proximal tip at the cavoatrial junction. No pneumothorax.  Upper normal heart size. Perihilar interstitial accentuation and faint perihilar airspace opacity. No blunting of the costophrenic angles.  Degenerative glenohumeral arthropathy bilaterally.  IMPRESSION: 1. Right IJ Diatek catheter noted, distal tip in the right atrium. No pneumothorax. 2. Increase in perihilar interstitial and patchy airspace opacity, suspicious for edema.   Electronically Signed   By: Sherryl Barters M.D.   On: 09/03/2014 08:38   Dg Chest Port 1 View  09/01/2014   CLINICAL DATA:  Pt is SOB and wheezing this morning. Blood sugar is low. Hx HTN and diabetes  EXAM: PORTABLE CHEST - 1 VIEW  COMPARISON:  03/15/2014  FINDINGS: New patchy interstitial and airspace opacities bilaterally involving bases more than apices. Heart size upper limits normal for technique. A hair pin projects over the left mainstem bronchus. No effusion. Visualized skeletal structures are  unremarkable.  IMPRESSION: 1. New bibasilar interstitial and airspace infiltrates or edema. 2. Hair pin projects over the left mainstem bronchus. Correlate with physical exam to confirm this is external to the patient. Critical Value/emergent results were called by telephone at the time of interpretation on 09/01/2014 at 11:25 am to Dr. Blanchie Dessert , who verbally acknowledged these results.   Electronically Signed   By: Arne Cleveland M.D.   On: 09/01/2014 11:25    Microbiology: Recent Results (from the past 240 hour(s))  Clostridium Difficile by PCR     Status: Abnormal   Collection Time: 09/02/14  7:59 AM  Result Value Ref Range Status   C difficile by pcr POSITIVE (A) NEGATIVE Final    Comment: CRITICAL RESULT CALLED TO, READ BACK BY AND VERIFIED WITH: E.MILIANO,RN 09/02/14 @1110  BY V.WILKINS   Culture, blood (routine x 2)     Status: None (Preliminary result)   Collection Time: 09/07/14  3:10 PM  Result Value Ref Range Status   Specimen Description BLOOD RIGHT ARM  Final   Special Requests BOTTLES DRAWN AEROBIC AND ANAEROBIC 5CC  Final   Culture   Final           BLOOD CULTURE RECEIVED NO GROWTH TO DATE CULTURE WILL BE HELD FOR 5 DAYS BEFORE ISSUING A FINAL NEGATIVE REPORT Performed at [facility]    Report Status PENDING  Incomplete  Culture, blood (routine x 2)     Status: None (Preliminary result)   Collection Time: 09/07/14  3:15 PM  Result Value Ref Range Status   Specimen Description BLOOD RIGHT HAND  Final   Special Requests BOTTLES DRAWN AEROBIC ONLY 10CC  Final   Culture   Final           BLOOD CULTURE RECEIVED NO GROWTH TO DATE CULTURE WILL BE HELD FOR 5 DAYS BEFORE ISSUING A FINAL NEGATIVE REPORT Performed at [facility]    Report Status PENDING  Incomplete     Labs: Basic Metabolic Panel:  Recent Labs Lab 09/04/14 0336 09/05/14 0309 09/06/14 0358 09/07/14 0415 09/08/14 0352  NA 135  134* 140 138 137 135  K  3.9  3.9 3.7 3.6 4.4 4.2   CL 104  103 107 105 104 103  CO2 22  22 26 26 22 25   GLUCOSE 104*  104* 106* 174* 89 154*  BUN 44*  45* 19 22 8 13   CREATININE 6.78*  6.75* 4.37* 5.37* 3.56* 5.05*  CALCIUM 7.6*  7.6* 7.9* 8.3* 7.9* 7.7*  PHOS 6.2* 4.0 4.2 3.0 4.0   Liver Function Tests:  Recent Labs Lab 09/04/14 0336 09/05/14 0309 09/06/14 0358 09/07/14 0415 09/08/14 0352  ALBUMIN 1.9* 2.0* 2.1* 2.2* 2.0*   No results for input(s): LIPASE, AMYLASE in the last 168 hours. No results for input(s): AMMONIA in the last 168 hours. CBC:  Recent Labs Lab 09/03/14 0420 09/04/14 0835 09/05/14 0309 09/06/14 0358 09/08/14 0352  WBC 6.7 7.0 8.1 8.8 11.3*  HGB 6.5* 7.4* 10.1* 10.1* 9.5*  HCT 19.6* 22.5* 30.0* 31.4* 31.0*  MCV 85.2 86.2 88.5 89.0 92.0  PLT 204 240 209 242 207   Cardiac Enzymes: No results for input(s): CKTOTAL, CKMB, CKMBINDEX, TROPONINI in the last 168 hours. BNP: BNP (last 3 results) No results for input(s): PROBNP in the last 8760 hours. CBG:  Recent Labs Lab 09/07/14 1111 09/07/14 1627 09/07/14 2204 09/08/14 0602 09/08/14 1158  GLUCAP 116* 172* 154* 128* 193*       Signed:  Zetha Kuhar  [facility] 09/08/2014, 5:55 PM

## 2014-09-08 NOTE — Progress Notes (Signed)
The patient received Tylenol x1 overnight and did not receive any more PRN medications.  She had a low grade fever throughout the night.

## 2014-09-08 NOTE — Progress Notes (Addendum)
Referral received.  Discussed with MD.  Recommended Tradgenta 5 mg instead of Lantus due to patient being insulin naive.  This is not renally cleared and also has a low risk of hypoglycemia.  Discussed with RN.  Discussed with RN. Adah Perl, RN, BC-ADM Inpatient Diabetes Coordinator Pager [phone]

## 2014-09-09 ENCOUNTER — Inpatient Hospital Stay (HOSPITAL_COMMUNITY): Payer: BC Managed Care – PPO

## 2014-09-09 DIAGNOSIS — J189 Pneumonia, unspecified organism: Secondary | ICD-10-CM

## 2014-09-09 DIAGNOSIS — R509 Fever, unspecified: Secondary | ICD-10-CM | POA: Insufficient documentation

## 2014-09-09 LAB — RENAL FUNCTION PANEL
Albumin: 2.1 g/dL — ABNORMAL LOW (ref 3.5–5.2)
Anion gap: 10 (ref 5–15)
BUN: 7 mg/dL (ref 6–23)
CO2: 26 mmol/L (ref 19–32)
Calcium: 7.9 mg/dL — ABNORMAL LOW (ref 8.4–10.5)
Chloride: 100 mEq/L (ref 96–112)
Creatinine, Ser: 3.34 mg/dL — ABNORMAL HIGH (ref 0.50–1.10)
GFR calc Af Amer: 16 mL/min — ABNORMAL LOW (ref >90)
GFR calc non Af Amer: 14 mL/min — ABNORMAL LOW (ref >90)
Glucose, Bld: 105 mg/dL — ABNORMAL HIGH (ref 70–99)
Phosphorus: 3.1 mg/dL (ref 2.3–4.6)
Potassium: 3.3 mmol/L — ABNORMAL LOW (ref 3.5–5.1)
Sodium: 136 mmol/L (ref 135–145)

## 2014-09-09 LAB — GLUCOSE, CAPILLARY
Glucose-Capillary: 150 mg/dL — ABNORMAL HIGH (ref 70–99)
Glucose-Capillary: 126 mg/dL — ABNORMAL HIGH (ref 70–99)
Glucose-Capillary: 125 mg/dL — ABNORMAL HIGH (ref 70–99)
Glucose-Capillary: 147 mg/dL — ABNORMAL HIGH (ref 70–99)

## 2014-09-09 MED ORDER — POTASSIUM CHLORIDE CRYS ER 20 MEQ PO TBCR
40.0000 meq | EXTENDED_RELEASE_TABLET | Freq: Once | ORAL | Status: AC
  Administered 2014-09-09: 40 meq via ORAL
  Filled 2014-09-09: qty 2

## 2014-09-09 MED ORDER — HYDRALAZINE HCL 20 MG/ML IJ SOLN
10.0000 mg | Freq: Once | INTRAMUSCULAR | Status: AC
  Administered 2014-09-09: 10 mg via INTRAVENOUS
  Filled 2014-09-09: qty 1

## 2014-09-09 MED ORDER — HYDRALAZINE HCL 20 MG/ML IJ SOLN
20.0000 mg | Freq: Four times a day (QID) | INTRAMUSCULAR | Status: DC | PRN
  Administered 2014-09-10: 20 mg via INTRAVENOUS
  Filled 2014-09-09 (×2): qty 1

## 2014-09-09 MED ORDER — HYDRALAZINE HCL 20 MG/ML IJ SOLN: 10.0000 mg | Freq: Once | INTRAMUSCULAR | Status: DC

## 2014-09-09 NOTE — Progress Notes (Signed)
[facility] PROGRESS NOTE  Carolyn Valenzuela QJJ:[id number] DOB: [birth year] DOA: 09/01/2014 PCP: Cathlean Cower, MD  Discharge was planned for yesterday, 09/08/2014 as she appeared nontoxic, reported feeling well, and was anticipating going home. She was unable to find a ride home and thus kept overnight. Overnight however she was noted to have a temperature of 103.2 having increasing cough and some shortness of breath, feeling ill.  Assessment/Plan: 1. End-stage renal disease -Patient progressing to end-stage renal disease, presenting with pulmonary edema, minimally responsive diuretic therapy, lab work showing creatinine in the 9 point range. -Nephrology recommending starting renal replacement therapy -Vascular surgery was consulted, undergoing right internal jugular vein tunneled dialysis catheter placement, procedure performed on 09/03/2014 -09/06/2014 patient taken to OR for placement of left brachiocephalic AV fistula placement  -Patient to continue hemodialysis on Mondays Wednesdays and Fridays  2. Suspected healthcare associated pneumonia -Overnight patient spiked a temperature 103.2, with repeat chest x-ray showing a persistent opacity in the left lung base that could potentially be secondary to infection. -She was started on vancomycin and Zosyn on 09/07/2014 at which time she spiked a temperature although didn't seem to have clinical signs symptoms of pneumonia. -Overnight she reports having increasing cough and shortness of breath. -Discharge has been discontinued, patient will remain in hospital to receive IV antimicrobial therapy since she continues to spike temperatures.  2.  Pulmonary edema/shortness of breath -Likely secondary to end-stage renal disease -Patient was administered IV diuretics with poor response -Improved with RRT  3.  Hyponatremia -Resolved  4.  Hypoglycemia -Blood sugar stable  Code Status: Full code Family Communication: I spoke to her sister over  telephone Disposition Plan: Plan for discharge home with [facility] when medically stable   Consultants:  Nephrology  Vascular surgery  Procedures:  Right internal jugular vein tunneled dialysis catheter placement   HPI/Subjective: Patient is a 64 year old female with a past medical history of near end-stage renal disease, type 2 diabetes mellitus, hypertension, status post right below-knee amputation who was admitted to the medicine service on 09/01/2014 presenting with complaints of shortness of breath, generalized weakness, have an overall functional decline. Lab work revealed further increase in her creatinine to 9.2 with BUN of 71. Her potassium was within normal limits at 4.8. Patient was seen and evaluated by nephrology recommended initiating renal replacement therapy. Vascular surgery was consulted as she underwent right internal jugular vein tunneled dialysis catheter placement, procedure performed on 09/03/2014. Hemodialysis to be performed after procedure.  Objective: Filed Vitals:   09/09/14 1409  BP: 143/52  Pulse: 78  Temp:   Resp: 18    Intake/Output Summary (Last 24 hours) at 09/09/14 1514 Last data filed at 09/09/14 1300  Gross per 24 hour  Intake    940 ml  Output   3305 ml  Net  -2365 ml   Filed Weights   09/08/14 0555 09/08/14 1334 09/09/14 0535  Weight: 56.885 kg (125 lb 6.5 oz) 58.9 kg (129 lb 13.6 oz) 56.1 kg (123 lb 10.9 oz)    Exam:   General:  Ill-appearing, marked change from yesterday's physical exam  Cardiovascular: Regular rate and rhythm normal S1-S2  Respiratory: Normal respiratory effort, patient having by basilar crackles, not using accessory muscles  Abdomen: Soft nontender nondistended  Musculoskeletal: Trace edema to left extremity, status post below the knee amputation 2 right lower extremity  Data Reviewed: Basic Metabolic Panel:  Recent Labs Lab 09/05/14 0309 09/06/14 0358 09/07/14 0415 09/08/14 0352 09/09/14 0413  NA 140  138 137  135 136  K 3.7 3.6 4.4 4.2 3.3*  CL 107 105 104 103 100  CO2 '26 26 22 25 26  ' GLUCOSE 106* 174* 89 154* 105*  BUN '19 22 8 13 7  ' CREATININE 4.37* 5.37* 3.56* 5.05* 3.34*  CALCIUM 7.9* 8.3* 7.9* 7.7* 7.9*  PHOS 4.0 4.2 3.0 4.0 3.1   Liver Function Tests:  Recent Labs Lab 09/05/14 0309 09/06/14 0358 09/07/14 0415 09/08/14 0352 09/09/14 0413  ALBUMIN 2.0* 2.1* 2.2* 2.0* 2.1*   No results for input(s): LIPASE, AMYLASE in the last 168 hours. No results for input(s): AMMONIA in the last 168 hours. CBC:  Recent Labs Lab 09/03/14 0420 09/04/14 0835 09/05/14 0309 09/06/14 0358 09/08/14 0352  WBC 6.7 7.0 8.1 8.8 11.3*  HGB 6.5* 7.4* 10.1* 10.1* 9.5*  HCT 19.6* 22.5* 30.0* 31.4* 31.0*  MCV 85.2 86.2 88.5 89.0 92.0  PLT 204 240 209 242 207   Cardiac Enzymes: No results for input(s): CKTOTAL, CKMB, CKMBINDEX, TROPONINI in the last 168 hours. BNP (last 3 results) No results for input(s): PROBNP in the last 8760 hours. CBG:  Recent Labs Lab 09/08/14 0602 09/08/14 1158 09/08/14 2058 09/09/14 0625 09/09/14 1159  GLUCAP 128* 193* 139* 150* 126*    Recent Results (from the past 240 hour(s))  Clostridium Difficile by PCR     Status: Abnormal   Collection Time: 09/02/14  7:59 AM  Result Value Ref Range Status   C difficile by pcr POSITIVE (A) NEGATIVE Final    Comment: CRITICAL RESULT CALLED TO, READ BACK BY AND VERIFIED WITH: E.MILIANO,RN 09/02/14 '@1110'  BY V.WILKINS   Culture, blood (routine x 2)     Status: None (Preliminary result)   Collection Time: 09/07/14  3:10 PM  Result Value Ref Range Status   Specimen Description BLOOD RIGHT ARM  Final   Special Requests BOTTLES DRAWN AEROBIC AND ANAEROBIC 5CC  Final   Culture   Final           BLOOD CULTURE RECEIVED NO GROWTH TO DATE CULTURE WILL BE HELD FOR 5 DAYS BEFORE ISSUING A FINAL NEGATIVE REPORT Performed at [facility]    Report Status PENDING  Incomplete  Culture, blood (routine x 2)     Status:  None (Preliminary result)   Collection Time: 09/07/14  3:15 PM  Result Value Ref Range Status   Specimen Description BLOOD RIGHT HAND  Final   Special Requests BOTTLES DRAWN AEROBIC ONLY 10CC  Final   Culture   Final           BLOOD CULTURE RECEIVED NO GROWTH TO DATE CULTURE WILL BE HELD FOR 5 DAYS BEFORE ISSUING A FINAL NEGATIVE REPORT Performed at [facility]    Report Status PENDING  Incomplete     Studies: Dg Chest 2 View  09/07/2014   CLINICAL DATA:  Fever.  History of diabetes and hypertension.  EXAM: CHEST  2 VIEW  COMPARISON:  09/03/2014; 09/01/2014 ; 03/15/2014  FINDINGS: Grossly unchanged borderline enlarged cardiac silhouette and mediastinal contours. Stable position of support apparatus. There is persistent mild elevation the medial aspect of right hemidiaphragm. The pulmonary vasculature remains indistinct with cephalization of flow. Minimally improved aeration lungs with persistent bilateral infrahilar heterogeneous opacities, left greater than right. Unchanged small bilateral effusions. No pneumothorax. Unchanged bones.  IMPRESSION: 1. Persistent bilateral infrahilar heterogeneous opacities, left greater than right, possibly atelectasis though worrisome for underlying infection. A follow-up chest radiograph in 4 to 6 weeks after treatment is recommended to ensure resolution.  2. Minimally improved aeration of the lungs with persistent findings of mild pulmonary edema/pulmonary venous congestion.   Electronically Signed   By: Sandi Mariscal M.D.   On: 09/07/2014 17:37   Dg Chest Port 1 View  09/09/2014   CLINICAL DATA:  Pneumonia.  EXAM: PORTABLE CHEST - 1 VIEW  COMPARISON:  09/07/2014  FINDINGS: Stable cardiac and mediastinal contours. Right internal jugular central venous catheter is present with tip projecting over the right atrium. Minimal heterogeneous opacities left lung base. Pulmonary vascular redistribution. No pleural effusion or pneumothorax.  IMPRESSION: Persistent  minimal heterogeneous opacities left lung base, potentially secondary to infection. Short-term radiographic follow-up is recommended to ensure resolution.   Electronically Signed   By: Lovey Newcomer M.D.   On: 09/09/2014 13:37    Scheduled Meds: . amLODipine  10 mg Oral Daily  . aspirin EC  81 mg Oral Daily  . calcium carbonate  400 mg of elemental calcium Oral TID WC  . cloNIDine  0.1 mg Oral BID  . ferrous sulfate  325 mg Oral Q breakfast  . gabapentin  100 mg Oral QHS  . heparin  5,000 Units Subcutaneous 3 times per day  . hydrALAZINE  10 mg Intravenous Once  . insulin starter kit- pen needles  1 kit Other Once  . isosorbide mononitrate  30 mg Oral Daily  . metroNIDAZOLE  500 mg Oral 3 times per day  . multivitamin  1 tablet Oral QHS  . piperacillin-tazobactam (ZOSYN)  IV  2.25 g Intravenous 3 times per day  . sodium chloride  3 mL Intravenous Q12H  . [START ON 09/10/2014] vancomycin  500 mg Intravenous Q M,W,F-HD   Continuous Infusions:   Principal Problem:   Pulmonary edema Active Problems:   Essential hypertension   End stage renal disease   S/P BKA (below knee amputation)   Type 2 diabetes mellitus with ESRD (end-stage renal disease)   Anemia of renal disease   Acute on chronic renal failure   ESRD needing dialysis    Time spent: 25 min    Carolyn Valenzuela  [facility] Pager [phone]. If 7PM-7AM, please contact night-coverage at [email or web address], password [facility] 09/09/2014, 3:14 PM  LOS: 8 days

## 2014-09-09 NOTE — Progress Notes (Signed)
Patient ID: Carolyn Valenzuela, female   DOB: 1950/10/23, 64 y.o.   MRN: [id number]   [facility] Progress Note   Assessment/ Plan:   1. End-stage renal disease from progressive chronic kidney disease-presentation with pulmonary edema/shortness of breath and poor response to diuretic therapy. She is set up for outpatient hemodialysis at [facility] on a Monday/Wednesday/Friday schedule. We'll order for hemodialysis again tomorrow to stop She is status post creation of a left brachiocephalic fistula yesterday (Dr. Bridgett Larsson) and follow-up visit has been set up. 2. Volume overload/pulmonary edema: Improving with initiation of dialysis-continue efforts at challenging dry weight   3. Anemia: On ESA and received 2 unit packed red cell transfusion yesterday dialysis. 4. Metabolic bone disease: Started on calcium carbonate for phosphorus binding 5. Nutrition: On a renal vitamin, continue protein supplementation as needed. 6. Fever: Possibly secondary to pneumonia given findings on chest x-ray and her recent cough-she is on empiric coverage with vancomycin and Zosyn. We'll recheck urinalysis/urine culture  Subjective:   The patient was prepared to be discharged yesterday however, spiked fever and had multiple constitutional complaints overnight. Bothered by diarrhea this morning    Objective:   BP 151/50 mmHg  Pulse 91  Temp(Src) 100.3 F (37.9 C) (Axillary)  Resp 18  Ht 5' (1.524 m)  Wt 56.1 kg (123 lb 10.9 oz)  BMI 24.15 kg/m2  SpO2 100%  Physical Exam: Gen: Appears to be uncomfortable sitting on the edge of her bed CVS: Pulse regular in rate and rhythm, S1 and S2 normal Resp: Clear to auscultation, no rales Abd: Soft, flat, nontender Ext: Status post right BKA  Labs: BMET  Recent Labs Lab 09/03/14 0420 09/04/14 0336 09/05/14 0309 09/06/14 0358 09/07/14 0415 09/08/14 0352 09/09/14 0413  NA 129* 135  134* 140 138 137 135 136  K 4.3 3.9  3.9 3.7 3.6 4.4  4.2 3.3*  CL 102 104  103 107 105 104 103 100  CO2 15* _0 GLUCOSE 120* 104*  104* 106* 174* 89 154* 105*  BUN 72* 44*  45* _1 CREATININE 9.13* 6.78*  6.75* 4.37* 5.37* 3.56* 5.05* 3.34*  CALCIUM 7.3* 7.6*  7.6* 7.9* 8.3* 7.9* 7.7* 7.9*  PHOS 8.1* 6.2* 4.0 4.2 3.0 4.0 3.1   CBC  Recent Labs Lab 09/04/14 0835 09/05/14 0309 09/06/14 0358 09/08/14 0352  WBC 7.0 8.1 8.8 11.3*  HGB 7.4* 10.1* 10.1* 9.5*  HCT 22.5* 30.0* 31.4* 31.0*  MCV 86.2 88.5 89.0 92.0  PLT 240 209 242 207    _2 @ Medications:    . amLODipine  10 mg Oral Daily  . aspirin EC  81 mg Oral Daily  . calcium carbonate  400 mg of elemental calcium Oral TID WC  . cloNIDine  0.1 mg Oral BID  . ferrous sulfate  325 mg Oral Q breakfast  . gabapentin  100 mg Oral QHS  . heparin  5,000 Units Subcutaneous 3 times per day  . hydrALAZINE  10 mg Intravenous Once  . insulin starter kit- pen needles  1 kit Other Once  . isosorbide mononitrate  30 mg Oral Daily  . metroNIDAZOLE  500 mg Oral 3 times per day  . multivitamin  1 tablet Oral QHS  . piperacillin-tazobactam (ZOSYN)  IV  2.25 g Intravenous 3 times per day  . potassium chloride  40 mEq Oral Once  . sodium chloride  3 mL Intravenous Q12H  . [  START ON 09/10/2014] vancomycin  500 mg Intravenous Q M,W,F-HD     Elmarie Shiley, MD 09/09/2014, 9:48 AM

## 2014-09-09 NOTE — Progress Notes (Signed)
The patient's BP was 200/80 manually at 0630.  She was symptomatic with a headache.  Fredirick Maudlin was notified and new orders were written.  The patient was given an additional 10 mg of hydralazine at this time.  She had been given PRN hydralazine at 0300 for a BP of 175/63.  The patient had multiple complaints overnight including coughing, headache, weakness, and generally not feeling well.  She received multiple PRN medications.  After she spiked the fever at 103.2, she continued to run a low grade fever thereafter.

## 2014-09-10 DIAGNOSIS — R509 Fever, unspecified: Secondary | ICD-10-CM | POA: Insufficient documentation

## 2014-09-10 DIAGNOSIS — T829XXA Unspecified complication of cardiac and vascular prosthetic device, implant and graft, initial encounter: Secondary | ICD-10-CM | POA: Insufficient documentation

## 2014-09-10 LAB — RENAL FUNCTION PANEL
Albumin: 2.1 g/dL — ABNORMAL LOW (ref 3.5–5.2)
Anion gap: 10 (ref 5–15)
BUN: 17 mg/dL (ref 6–23)
CO2: 21 mmol/L (ref 19–32)
Calcium: 7.5 mg/dL — ABNORMAL LOW (ref 8.4–10.5)
Chloride: 102 mEq/L (ref 96–112)
Creatinine, Ser: 4.89 mg/dL — ABNORMAL HIGH (ref 0.50–1.10)
GFR calc Af Amer: 10 mL/min — ABNORMAL LOW (ref >90)
GFR calc non Af Amer: 9 mL/min — ABNORMAL LOW (ref >90)
Glucose, Bld: 167 mg/dL — ABNORMAL HIGH (ref 70–99)
Phosphorus: 4.8 mg/dL — ABNORMAL HIGH (ref 2.3–4.6)
Potassium: 4 mmol/L (ref 3.5–5.1)
Sodium: 133 mmol/L — ABNORMAL LOW (ref 135–145)

## 2014-09-10 LAB — GLUCOSE, CAPILLARY
Glucose-Capillary: 119 mg/dL — ABNORMAL HIGH (ref 70–99)
Glucose-Capillary: 197 mg/dL — ABNORMAL HIGH (ref 70–99)
Glucose-Capillary: 105 mg/dL — ABNORMAL HIGH (ref 70–99)
Glucose-Capillary: 142 mg/dL — ABNORMAL HIGH (ref 70–99)
Glucose-Capillary: 158 mg/dL — ABNORMAL HIGH (ref 70–99)

## 2014-09-10 MED ORDER — HEPARIN SODIUM (PORCINE) 1000 UNIT/ML DIALYSIS: 40.0000 [IU]/kg | INTRAMUSCULAR | Status: DC | PRN

## 2014-09-10 MED ORDER — VANCOMYCIN 50 MG/ML ORAL SOLUTION
125.0000 mg | Freq: Four times a day (QID) | ORAL | Status: DC
  Administered 2014-09-10 – 2014-09-13 (×10): 125 mg via ORAL
  Filled 2014-09-10 (×16): qty 2.5

## 2014-09-10 MED ORDER — HEPARIN SODIUM (PORCINE) 1000 UNIT/ML DIALYSIS
40.0000 [IU]/kg | INTRAMUSCULAR | Status: DC | PRN
Start: 2014-09-10 — End: 2014-09-10

## 2014-09-10 NOTE — Progress Notes (Signed)
[facility] PROGRESS NOTE  Carolyn Valenzuela XYB:[id number] DOB: 12/06/1950 DOA: 09/01/2014 PCP: Cathlean Cower, MD   Assessment/Plan: 1. End-stage renal disease -Patient progressing to end-stage renal disease, presenting with pulmonary edema, minimally responsive diuretic therapy, lab work showing creatinine in the 9 point range. -Nephrology recommending starting renal replacement therapy -Vascular surgery was consulted, undergoing right internal jugular vein tunneled dialysis catheter placement, procedure performed on 09/03/2014 -09/06/2014 patient taken to OR for placement of left brachiocephalic AV fistula placement  -Patient to continue hemodialysis on Mondays Wednesdays and Fridays  2. Suspected healthcare associated pneumonia -Patient continues to spike temperatures, blood cultures hour obtained on 09/07/2014 show no growth 2 sets. Will repeat set of blood cultures at this time given ongoing fever. -She was started on vancomycin and Zosyn on 09/07/2014  -Last chest x-ray was performed on 09/09/2014 which showed persistent minimal heterogeneous opacities involving left lung base second be secondary to infection. -Will repeat chest x-ray and a meanwhile continue current antimicrobial regimen. -She is being treated for C. difficile colitis currently on Flagyl 500 mg by mouth 8 hours. I will change to oral vancomycin given persistence of fevers.  2.  Pulmonary edema/shortness of breath -Likely secondary to end-stage renal disease -Patient was administered IV diuretics with poor response -Improved with RRT  3.  Hyponatremia -Resolved  4.  Hypoglycemia -Blood sugar stable, off of hypoglycemic medications  Code Status: Full code Family Communication: I spoke to her sister over telephone Disposition Plan: Plan for discharge home with [facility] when medically stable   Consultants:  Nephrology  Vascular surgery  Procedures:  Right internal jugular vein tunneled dialysis catheter  placement   HPI/Subjective: Patient is a 64 year old female with a past medical history of near end-stage renal disease, type 2 diabetes mellitus, hypertension, status post right below-knee amputation who was admitted to the medicine service on 09/01/2014 presenting with complaints of shortness of breath, generalized weakness, have an overall functional decline. Lab work revealed further increase in her creatinine to 9.2 with BUN of 71. Her potassium was within normal limits at 4.8. Patient was seen and evaluated by nephrology recommended initiating renal replacement therapy. Vascular surgery was consulted as she underwent right internal jugular vein tunneled dialysis catheter placement, procedure performed on 09/03/2014. Hemodialysis to be performed after procedure.  Objective: Filed Vitals:   09/10/14 1357  BP: 138/53  Pulse: 89  Temp: 101.1 F (38.4 C)  Resp: 24    Intake/Output Summary (Last 24 hours) at 09/10/14 1726 Last data filed at 09/10/14 1402  Gross per 24 hour  Intake    400 ml  Output   1528 ml  Net  -1128 ml   Filed Weights   09/10/14 0549 09/10/14 0640 09/10/14 1015  Weight: 54.1 kg (119 lb 4.3 oz) 57.5 kg (126 lb 12.2 oz) 56.6 kg (124 lb 12.5 oz)    Exam:   General: Patient appears somewhat improved, is awake, alert, oriented.  Cardiovascular: Regular rate and rhythm normal S1-S2  Respiratory: Normal respiratory effort, patient having by basilar crackles, not using accessory muscles  Abdomen: Soft nontender nondistended  Musculoskeletal: Trace edema to left extremity, status post below the knee amputation 2 right lower extremity  Data Reviewed: Basic Metabolic Panel:  Recent Labs Lab 09/06/14 0358 09/07/14 0415 09/08/14 0352 09/09/14 0413 09/10/14 0249  NA 138 137 135 136 133*  K 3.6 4.4 4.2 3.3* 4.0  CL 105 104 103 100 102  CO2 '26 22 25 26 21  ' GLUCOSE 174* 89 154* 105*  167*  BUN '22 8 13 7 17  ' CREATININE 5.37* 3.56* 5.05* 3.34* 4.89*  CALCIUM  8.3* 7.9* 7.7* 7.9* 7.5*  PHOS 4.2 3.0 4.0 3.1 4.8*   Liver Function Tests:  Recent Labs Lab 09/06/14 0358 09/07/14 0415 09/08/14 0352 09/09/14 0413 09/10/14 0249  ALBUMIN 2.1* 2.2* 2.0* 2.1* 2.1*   No results for input(s): LIPASE, AMYLASE in the last 168 hours. No results for input(s): AMMONIA in the last 168 hours. CBC:  Recent Labs Lab 09/04/14 0835 09/05/14 0309 09/06/14 0358 09/08/14 0352  WBC 7.0 8.1 8.8 11.3*  HGB 7.4* 10.1* 10.1* 9.5*  HCT 22.5* 30.0* 31.4* 31.0*  MCV 86.2 88.5 89.0 92.0  PLT 240 209 242 207   Cardiac Enzymes: No results for input(s): CKTOTAL, CKMB, CKMBINDEX, TROPONINI in the last 168 hours. BNP (last 3 results) No results for input(s): PROBNP in the last 8760 hours. CBG:  Recent Labs Lab 09/09/14 1642 09/09/14 2049 09/10/14 0543 09/10/14 1056 09/10/14 1651  GLUCAP 125* 147* 197* 105* 142*    Recent Results (from the past 240 hour(s))  Clostridium Difficile by PCR     Status: Abnormal   Collection Time: 09/02/14  7:59 AM  Result Value Ref Range Status   C difficile by pcr POSITIVE (A) NEGATIVE Final    Comment: CRITICAL RESULT CALLED TO, READ BACK BY AND VERIFIED WITH: E.MILIANO,RN 09/02/14 '@1110'  BY V.WILKINS   Culture, blood (routine x 2)     Status: None (Preliminary result)   Collection Time: 09/07/14  3:10 PM  Result Value Ref Range Status   Specimen Description BLOOD RIGHT ARM  Final   Special Requests BOTTLES DRAWN AEROBIC AND ANAEROBIC 5CC  Final   Culture   Final           BLOOD CULTURE RECEIVED NO GROWTH TO DATE CULTURE WILL BE HELD FOR 5 DAYS BEFORE ISSUING A FINAL NEGATIVE REPORT Performed at [facility]    Report Status PENDING  Incomplete  Culture, blood (routine x 2)     Status: None (Preliminary result)   Collection Time: 09/07/14  3:15 PM  Result Value Ref Range Status   Specimen Description BLOOD RIGHT HAND  Final   Special Requests BOTTLES DRAWN AEROBIC ONLY 10CC  Final   Culture   Final            BLOOD CULTURE RECEIVED NO GROWTH TO DATE CULTURE WILL BE HELD FOR 5 DAYS BEFORE ISSUING A FINAL NEGATIVE REPORT Performed at [facility]    Report Status PENDING  Incomplete     Studies: Dg Chest Port 1 View  09/09/2014   CLINICAL DATA:  Pneumonia.  EXAM: PORTABLE CHEST - 1 VIEW  COMPARISON:  09/07/2014  FINDINGS: Stable cardiac and mediastinal contours. Right internal jugular central venous catheter is present with tip projecting over the right atrium. Minimal heterogeneous opacities left lung base. Pulmonary vascular redistribution. No pleural effusion or pneumothorax.  IMPRESSION: Persistent minimal heterogeneous opacities left lung base, potentially secondary to infection. Short-term radiographic follow-up is recommended to ensure resolution.   Electronically Signed   By: Lovey Newcomer M.D.   On: 09/09/2014 13:37    Scheduled Meds: . amLODipine  10 mg Oral Daily  . aspirin EC  81 mg Oral Daily  . calcium carbonate  400 mg of elemental calcium Oral TID WC  . cloNIDine  0.1 mg Oral BID  . ferrous sulfate  325 mg Oral Q breakfast  . gabapentin  100 mg Oral QHS  .  heparin  5,000 Units Subcutaneous 3 times per day  . hydrALAZINE  10 mg Intravenous Once  . insulin starter kit- pen needles  1 kit Other Once  . isosorbide mononitrate  30 mg Oral Daily  . metroNIDAZOLE  500 mg Oral 3 times per day  . multivitamin  1 tablet Oral QHS  . piperacillin-tazobactam (ZOSYN)  IV  2.25 g Intravenous 3 times per day  . sodium chloride  3 mL Intravenous Q12H  . vancomycin  500 mg Intravenous Q M,W,F-HD   Continuous Infusions:   Principal Problem:   Pulmonary edema Active Problems:   Essential hypertension   End stage renal disease   S/P BKA (below knee amputation)   Type 2 diabetes mellitus with ESRD (end-stage renal disease)   Anemia of renal disease   Acute on chronic renal failure   ESRD needing dialysis   HCAP (healthcare-associated pneumonia)   Fever    Time spent: 30  min    Kelvin Cellar  [facility] Pager [phone]. If 7PM-7AM, please contact night-coverage at [email or web address], password [facility] 09/10/2014, 5:26 PM  LOS: 9 days

## 2014-09-10 NOTE — Procedures (Signed)
I have seen and examined this patient and agree with the plan of care . No complaints today appears to be doing will on dialysis   [facility] W 09/10/2014, 9:50 AM

## 2014-09-10 NOTE — Progress Notes (Signed)
ANTIBIOTIC CONSULT NOTE   Pharmacy Consult:  Vancomycin / Zosyn Indication:  PNA, fever  Allergies  Allergen Reactions  . Oxycodone Itching    Patient Measurements: Height: 5' (152.4 cm) Weight: 124 lb 12.5 oz (56.6 kg) IBW/kg (Calculated) : 45.5  Vital Signs: Temp: 102.9 F (39.4 C) (01/11 1051) Temp Source: Oral (01/11 1051) BP: 191/55 mmHg (01/11 1051) Pulse Rate: 105 (01/11 1051) Intake/Output from previous day: 01/10 0701 - 01/11 0700 In: 520 [P.O.:420; IV Piggyback:100] Out: 400 [Urine:400] Intake/Output from this shift: Total I/O In: -  Out: 1000 [Other:1000]  Labs:  Recent Labs  09/08/14 0352 09/09/14 0413 09/10/14 0249  WBC 11.3*  --   --   HGB 9.5*  --   --   PLT 207  --   --   CREATININE 5.05* 3.34* 4.89*   Estimated Creatinine Clearance: 9.4 mL/min (by C-G formula based on Cr of 4.89). No results for input(s): VANCOTROUGH, VANCOPEAK, VANCORANDOM, GENTTROUGH, GENTPEAK, GENTRANDOM, TOBRATROUGH, TOBRAPEAK, TOBRARND, AMIKACINPEAK, AMIKACINTROU, AMIKACIN in the last 72 hours.   Microbiology: Recent Results (from the past 720 hour(s))  Clostridium Difficile by PCR     Status: Abnormal   Collection Time: 09/02/14  7:59 AM  Result Value Ref Range Status   C difficile by pcr POSITIVE (A) NEGATIVE Final    Comment: CRITICAL RESULT CALLED TO, READ BACK BY AND VERIFIED WITH: E.MILIANO,RN 09/02/14 @1110  BY V.WILKINS   Culture, blood (routine x 2)     Status: None (Preliminary result)   Collection Time: 09/07/14  3:10 PM  Result Value Ref Range Status   Specimen Description BLOOD RIGHT ARM  Final   Special Requests BOTTLES DRAWN AEROBIC AND ANAEROBIC 5CC  Final   Culture   Final           BLOOD CULTURE RECEIVED NO GROWTH TO DATE CULTURE WILL BE HELD FOR 5 DAYS BEFORE ISSUING A FINAL NEGATIVE REPORT Performed at [facility]    Report Status PENDING  Incomplete  Culture, blood (routine x 2)     Status: None (Preliminary result)   Collection Time:  09/07/14  3:15 PM  Result Value Ref Range Status   Specimen Description BLOOD RIGHT HAND  Final   Special Requests BOTTLES DRAWN AEROBIC ONLY 10CC  Final   Culture   Final           BLOOD CULTURE RECEIVED NO GROWTH TO DATE CULTURE WILL BE HELD FOR 5 DAYS BEFORE ISSUING A FINAL NEGATIVE REPORT Performed at [facility]    Report Status PENDING  Incomplete    Medical History: Past Medical History  Diagnosis Date  . DIABETES MELLITUS, UNCONTROLLED 05/21/2009  . HYPERLIPIDEMIA 03/30/2007  . ANEMIA-IRON DEFICIENCY 01/25/2008  . HYPERTENSION 01/25/2008  . SINUSITIS- ACUTE-NOS 01/25/2008  . SECONDARY HYPERPARATHYROIDISM 05/21/2009  . RENAL INSUFFICIENCY 02/01/2008  . CERVICAL RADICULOPATHY, LEFT 01/25/2008  . BACK PAIN 05/02/2009  . NUMBNESS 05/21/2009  . Allergic rhinitis, cause unspecified 12/08/2010  . Foot ulcer     right lateral malleolus  . Critical lower limb ischemia       Assessment: 68 YOF with ESRD and new fever to start empiric vancomycin and Zosyn.  It appears that patient still has some residual renal function given documented UOP of 0.3 ml/kg/hr.  Patient continues on Flagyl for +C.diff. She is still having fevers. Cultures ngtd  S/p HD today  Goal of Therapy:  Vanc pre-HD level:  15-25 mcg/mL   Plan:  - Vanc 500mg  IV q-HD MWF -  Zosyn 2.25gm IV Q8H - Monitor HD scheduled/tolerance, micro data, vanc level as indicated     Thanks for allowing pharmacy to be a part of this patient's care.  Excell Seltzer, PharmD Clinical Pharmacist, [phone] 09/10/2014, 10:54 AM

## 2014-09-10 NOTE — Progress Notes (Addendum)
UR completed Rain Friedt K. Anush Wiedeman, RN, BSN, MSHL, CCM  09/10/2014 12:41 PM

## 2014-09-10 NOTE — Progress Notes (Signed)
The patient had periods of forgetfulness overnight and the bed alarm was kept on.  She vomited one time at the beginning of the shift and was given Zofran with relief.  She did not complain of anymore nausea overnight.  She had a temperature again of 102.9 orally this morning and a dose of Tylenol brought it back down to 99.7.  She received one dose of hydralazine for a systolic BP in the [id number], of which came down to 155.

## 2014-09-11 ENCOUNTER — Inpatient Hospital Stay (HOSPITAL_COMMUNITY): Payer: BC Managed Care – PPO

## 2014-09-11 ENCOUNTER — Ambulatory Visit: Payer: BC Managed Care – PPO | Admitting: Physical Therapy

## 2014-09-11 LAB — RENAL FUNCTION PANEL
Albumin: 2 g/dL — ABNORMAL LOW (ref 3.5–5.2)
Anion gap: 5 (ref 5–15)
BUN: 12 mg/dL (ref 6–23)
CO2: 26 mmol/L (ref 19–32)
Calcium: 7.3 mg/dL — ABNORMAL LOW (ref 8.4–10.5)
Chloride: 102 mEq/L (ref 96–112)
Creatinine, Ser: 3.84 mg/dL — ABNORMAL HIGH (ref 0.50–1.10)
GFR calc Af Amer: 13 mL/min — ABNORMAL LOW (ref >90)
GFR calc non Af Amer: 12 mL/min — ABNORMAL LOW (ref >90)
Glucose, Bld: 128 mg/dL — ABNORMAL HIGH (ref 70–99)
Phosphorus: 3.6 mg/dL (ref 2.3–4.6)
Potassium: 3.7 mmol/L (ref 3.5–5.1)
Sodium: 133 mmol/L — ABNORMAL LOW (ref 135–145)

## 2014-09-11 LAB — GLUCOSE, CAPILLARY
Glucose-Capillary: 135 mg/dL — ABNORMAL HIGH (ref 70–99)
Glucose-Capillary: 145 mg/dL — ABNORMAL HIGH (ref 70–99)
Glucose-Capillary: 157 mg/dL — ABNORMAL HIGH (ref 70–99)
Glucose-Capillary: 196 mg/dL — ABNORMAL HIGH (ref 70–99)

## 2014-09-11 NOTE — Progress Notes (Signed)
[facility] PROGRESS NOTE  Carolyn Valenzuela AJO:[id number] DOB: Jul 08, 1951 DOA: 09/01/2014 PCP: Cathlean Cower, MD  Interim summary Patient is a 64 year old female with a past medical history of near end-stage renal disease, type 2 diabetes mellitus, hypertension, status post right below-knee amputation who was admitted to the medicine service on 09/01/2014 presenting with complaints of shortness of breath, generalized weakness, have an overall functional decline. She was found to be C. difficile positive and started on Flagyl therapy. Lab work revealed further increase in her creatinine to 9.2 with BUN of 71. Her potassium was within normal limits at 4.8. Patient was seen and evaluated by nephrology recommended initiating renal replacement therapy. Vascular surgery was consulted as she underwent right internal jugular vein tunneled dialysis catheter placement, procedure performed on 09/03/2014. Hemodialysis to be performed after procedure. Vascular surgery was consulted as she underwent left brachiocephalic anterior venous fistula placement, procedure performed by Dr.Chen on 09/06/2014. I had spoken to nephrology about discharging her on 09/07/2014. Nephrology agreed as she remains stable, appeared well, and had already been set up with hemodialysis in the outpatient setting. Just before discharge she was found to have a low grade temperature of 100.9. She was started on empiric IV antibiotic therapy. I Suspected that fever could be arising from C. difficile colitis versus developing pneumonia. Workup included a chest x-ray that showed opacities in bilateral infrahilar regions that could be atelectasis versus infectious. She was started on empiric IV antimicrobial therapy with vancomycin and Zosyn. She appeared well and nephrology felt that IV antimicrobial therapy could be given with hemodialysis in the outpatient setting. Discharge orders were placed, discharge summary dictated on 09/08/2014. Family members,  however, expressed concerns for taken her home stating that she appeared ill. She was kept overnight and found to have a temperature of 102.4, thus was kept in the hospital setting to receive antimicrobial therapy. She last spiked a temperature of 102.9 on 09/10/2014.   Assessment/Plan: 1. End-stage renal disease -Patient progressing to end-stage renal disease, presenting with pulmonary edema, minimally responsive diuretic therapy, lab work showing creatinine in the 9 point range. -Nephrology recommending starting renal replacement therapy -Vascular surgery was consulted, undergoing right internal jugular vein tunneled dialysis catheter placement, procedure performed on 09/03/2014 -09/06/2014 patient taken to OR for placement of left brachiocephalic AV fistula placement  -Patient to continue hemodialysis on Mondays Wednesdays and Fridays  2. Suspected healthcare associated pneumonia -Patient continues to spike temperatures, blood cultures hour obtained on 09/07/2014 show no growth 2 sets. Will repeat set of blood cultures at this time given ongoing fever. -She was started on vancomycin and Zosyn on 09/07/2014  -Last chest x-ray was performed on 09/09/2014 which showed persistent minimal heterogeneous opacities involving left lung base second be secondary to infection. -Will continue one more day of IV antimicrobial therapy, consider transition into orals in a.m. if remains afebrile  3.  C. difficile colitis. -Given her persistent fevers and diarrhea I substituted oral vancomycin for Flagyl. -It appears that there has been some clinical improvement since switching Flagyl for oral vancomycin. She has not spiked high fevers in the past 24 hours and reports proven to her diarrheal symptoms.  4.  Pulmonary edema/shortness of breath -Likely secondary to end-stage renal disease -Patient was administered IV diuretics with poor response -Improved with RRT  5.  Hyponatremia -Resolved  6.   Hypoglycemia -Blood sugar stable, off of hypoglycemic medications  Code Status: Full code Family Communication: I spoke to her sister over telephone Disposition Plan: Plan for  discharge home with [facility] when medically stable   Consultants:  Nephrology  Vascular surgery  Procedures:  Right internal jugular vein tunneled dialysis catheter placement   HPI/Subjective: Patient states feeling better today, tolerating by mouth intake, feels diarrhea has improved  Objective: Filed Vitals:   09/11/14 1433  BP: 164/86  Pulse: 75  Temp: 99.2 F (37.3 C)  Resp:     Intake/Output Summary (Last 24 hours) at 09/11/14 1831 Last data filed at 09/11/14 0845  Gross per 24 hour  Intake    510 ml  Output    150 ml  Net    360 ml   Filed Weights   09/10/14 0640 09/10/14 1015 09/11/14 0619  Weight: 57.5 kg (126 lb 12.2 oz) 56.6 kg (124 lb 12.5 oz) 55.1 kg (121 lb 7.6 oz)    Exam:   General: Patient appears somewhat improved, is awake, alert, oriented.  Cardiovascular: Regular rate and rhythm normal S1-S2  Respiratory: Normal respiratory effort, patient having by basilar crackles, not using accessory muscles  Abdomen: Soft nontender nondistended  Musculoskeletal: Trace edema to left extremity, status post below the knee amputation 2 right lower extremity  Data Reviewed: Basic Metabolic Panel:  Recent Labs Lab 09/07/14 0415 09/08/14 0352 09/09/14 0413 09/10/14 0249 09/11/14 0500  NA 137 135 136 133* 133*  K 4.4 4.2 3.3* 4.0 3.7  CL 104 103 100 102 102  CO2 '22 25 26 21 26  ' GLUCOSE 89 154* 105* 167* 128*  BUN '8 13 7 17 12  ' CREATININE 3.56* 5.05* 3.34* 4.89* 3.84*  CALCIUM 7.9* 7.7* 7.9* 7.5* 7.3*  PHOS 3.0 4.0 3.1 4.8* 3.6   Liver Function Tests:  Recent Labs Lab 09/07/14 0415 09/08/14 0352 09/09/14 0413 09/10/14 0249 09/11/14 0500  ALBUMIN 2.2* 2.0* 2.1* 2.1* 2.0*   No results for input(s): LIPASE, AMYLASE in the last 168 hours. No results for input(s):  AMMONIA in the last 168 hours. CBC:  Recent Labs Lab 09/05/14 0309 09/06/14 0358 09/08/14 0352  WBC 8.1 8.8 11.3*  HGB 10.1* 10.1* 9.5*  HCT 30.0* 31.4* 31.0*  MCV 88.5 89.0 92.0  PLT 209 242 207   Cardiac Enzymes: No results for input(s): CKTOTAL, CKMB, CKMBINDEX, TROPONINI in the last 168 hours. BNP (last 3 results) No results for input(s): PROBNP in the last 8760 hours. CBG:  Recent Labs Lab 09/10/14 1651 09/10/14 2053 09/11/14 0622 09/11/14 1131 09/11/14 1724  GLUCAP 142* 158* 135* 145* 157*    Recent Results (from the past 240 hour(s))  Clostridium Difficile by PCR     Status: Abnormal   Collection Time: 09/02/14  7:59 AM  Result Value Ref Range Status   C difficile by pcr POSITIVE (A) NEGATIVE Final    Comment: CRITICAL RESULT CALLED TO, READ BACK BY AND VERIFIED WITH: E.MILIANO,RN 09/02/14 '@1110'  BY V.WILKINS   Culture, blood (routine x 2)     Status: None (Preliminary result)   Collection Time: 09/07/14  3:10 PM  Result Value Ref Range Status   Specimen Description BLOOD RIGHT ARM  Final   Special Requests BOTTLES DRAWN AEROBIC AND ANAEROBIC 5CC  Final   Culture   Final           BLOOD CULTURE RECEIVED NO GROWTH TO DATE CULTURE WILL BE HELD FOR 5 DAYS BEFORE ISSUING A FINAL NEGATIVE REPORT Performed at [facility]    Report Status PENDING  Incomplete  Culture, blood (routine x 2)     Status: None (Preliminary result)  Collection Time: 09/07/14  3:15 PM  Result Value Ref Range Status   Specimen Description BLOOD RIGHT HAND  Final   Special Requests BOTTLES DRAWN AEROBIC ONLY 10CC  Final   Culture   Final           BLOOD CULTURE RECEIVED NO GROWTH TO DATE CULTURE WILL BE HELD FOR 5 DAYS BEFORE ISSUING A FINAL NEGATIVE REPORT Performed at [facility]    Report Status PENDING  Incomplete     Studies: Dg Chest 2 View  09/11/2014   CLINICAL DATA:  Shortness of breath, pneumonia/HCAP  EXAM: CHEST  2 VIEW  COMPARISON:  09/09/2014 and  09/07/2014  FINDINGS: Right IJ central venous catheter unchanged with tip over the right atrium. Lungs are adequately inflated without focal consolidation or effusion. Cardiomediastinal silhouette is within normal. There is minimal calcified plaque over the aorta. Remainder of the exam is unchanged.  IMPRESSION: No active cardiopulmonary disease.   Electronically Signed   By: [name] Olp M.D.   On: 09/11/2014 08:15    Scheduled Meds: . amLODipine  10 mg Oral Daily  . aspirin EC  81 mg Oral Daily  . calcium carbonate  400 mg of elemental calcium Oral TID WC  . cloNIDine  0.1 mg Oral BID  . ferrous sulfate  325 mg Oral Q breakfast  . gabapentin  100 mg Oral QHS  . heparin  5,000 Units Subcutaneous 3 times per day  . hydrALAZINE  10 mg Intravenous Once  . insulin starter kit- pen needles  1 kit Other Once  . isosorbide mononitrate  30 mg Oral Daily  . multivitamin  1 tablet Oral QHS  . piperacillin-tazobactam (ZOSYN)  IV  2.25 g Intravenous 3 times per day  . sodium chloride  3 mL Intravenous Q12H  . vancomycin  125 mg Oral 4 times per day  . vancomycin  500 mg Intravenous Q M,W,F-HD   Continuous Infusions:   Principal Problem:   Pulmonary edema Active Problems:   Essential hypertension   End stage renal disease   S/P BKA (below knee amputation)   Type 2 diabetes mellitus with ESRD (end-stage renal disease)   Anemia of renal disease   Acute on chronic renal failure   ESRD needing dialysis   HCAP (healthcare-associated pneumonia)   Fever    Time spent: 30 min    Kelvin Cellar  [facility] Pager [phone]. If 7PM-7AM, please contact night-coverage at [email or web address], password [email or web address] 09/11/2014, 6:31 PM  LOS: 10 days

## 2014-09-11 NOTE — Progress Notes (Signed)
[facility] ROUNDING NOTE   Subjective:   Interval History: patient is anxious to go home  Outpatient dialysis arranged [facility] MWF   Objective:  Vital signs in last 24 hours:  Temp:  [98.4 F (36.9 C)-101.1 F (38.4 C)] 98.4 F (36.9 C) (01/12 0619) Pulse Rate:  [73-93] 73 (01/12 0619) Resp:  [18-24] 18 (01/12 0619) BP: (138-174)/(53-63) 162/57 mmHg (01/12 0619) SpO2:  [93 %-100 %] 100 % (01/12 0619) Weight:  [55.1 kg (121 lb 7.6 oz)] 55.1 kg (121 lb 7.6 oz) (01/12 0619)  Weight change: 2.5 kg (5 lb 8.2 oz) Filed Weights   09/10/14 0640 09/10/14 1015 09/11/14 0619  Weight: 57.5 kg (126 lb 12.2 oz) 56.6 kg (124 lb 12.5 oz) 55.1 kg (121 lb 7.6 oz)    Intake/Output: I/O last 3 completed shifts: In: 940 [P.O.:740; IV Piggyback:200] Out: 1678 [Urine:675; Other:1000; Stool:3]   Intake/Output this shift:  Total I/O In: 120 [P.O.:120] Out: -   CVS- RRR RS- CTA ABD- BS present soft non-distended EXT- no edema   Basic Metabolic Panel:  Recent Labs Lab 09/07/14 0415 09/08/14 0352 09/09/14 0413 09/10/14 0249 09/11/14 0500  NA 137 135 136 133* 133*  K 4.4 4.2 3.3* 4.0 3.7  CL 104 103 100 102 102  CO2 _0 GLUCOSE 89 154* 105* 167* 128*  BUN _1 CREATININE 3.56* 5.05* 3.34* 4.89* 3.84*  CALCIUM 7.9* 7.7* 7.9* 7.5* 7.3*  PHOS 3.0 4.0 3.1 4.8* 3.6    Liver Function Tests:  Recent Labs Lab 09/07/14 0415 09/08/14 0352 09/09/14 0413 09/10/14 0249 09/11/14 0500  ALBUMIN 2.2* 2.0* 2.1* 2.1* 2.0*   No results for input(s): LIPASE, AMYLASE in the last 168 hours. No results for input(s): AMMONIA in the last 168 hours.  CBC:  Recent Labs Lab 09/05/14 0309 09/06/14 0358 09/08/14 0352  WBC 8.1 8.8 11.3*  HGB 10.1* 10.1* 9.5*  HCT 30.0* 31.4* 31.0*  MCV 88.5 89.0 92.0  PLT 209 242 207    Cardiac Enzymes: No results for input(s): CKTOTAL, CKMB, CKMBINDEX, TROPONINI in the last 168 hours.  BNP: Invalid  input(s): POCBNP  CBG:  Recent Labs Lab 09/10/14 1056 09/10/14 1651 09/10/14 2053 09/11/14 0622 09/11/14 1131  GLUCAP 105* 142* 158* 135* 145*    Microbiology: Results for orders placed or performed during the hospital encounter of 09/01/14  Clostridium Difficile by PCR     Status: Abnormal   Collection Time: 09/02/14  7:59 AM  Result Value Ref Range Status   C difficile by pcr POSITIVE (A) NEGATIVE Final    Comment: CRITICAL RESULT CALLED TO, READ BACK BY AND VERIFIED WITH: E.MILIANO,RN 09/02/14 _2  BY V.WILKINS   Culture, blood (routine x 2)     Status: None (Preliminary result)   Collection Time: 09/07/14  3:10 PM  Result Value Ref Range Status   Specimen Description BLOOD RIGHT ARM  Final   Special Requests BOTTLES DRAWN AEROBIC AND ANAEROBIC 5CC  Final   Culture   Final           BLOOD CULTURE RECEIVED NO GROWTH TO DATE CULTURE WILL BE HELD FOR 5 DAYS BEFORE ISSUING A FINAL NEGATIVE REPORT Performed at [facility]    Report Status PENDING  Incomplete  Culture, blood (routine x 2)     Status: None (Preliminary result)   Collection Time: 09/07/14  3:15 PM  Result Value Ref Range Status   Specimen Description BLOOD RIGHT HAND  Final   Special Requests BOTTLES DRAWN AEROBIC ONLY 10CC  Final   Culture   Final           BLOOD CULTURE RECEIVED NO GROWTH TO DATE CULTURE WILL BE HELD FOR 5 DAYS BEFORE ISSUING A FINAL NEGATIVE REPORT Performed at [facility]    Report Status PENDING  Incomplete    Coagulation Studies: No results for input(s): LABPROT, INR in the last 72 hours.  Urinalysis: No results for input(s): COLORURINE, LABSPEC, PHURINE, GLUCOSEU, HGBUR, BILIRUBINUR, KETONESUR, PROTEINUR, UROBILINOGEN, NITRITE, LEUKOCYTESUR in the last 72 hours.  Invalid input(s): APPERANCEUR    Imaging: Dg Chest 2 View  09/11/2014   CLINICAL DATA:  Shortness of breath, pneumonia/HCAP  EXAM: CHEST  2 VIEW  COMPARISON:  09/09/2014 and 09/07/2014  FINDINGS:  Right IJ central venous catheter unchanged with tip over the right atrium. Lungs are adequately inflated without focal consolidation or effusion. Cardiomediastinal silhouette is within normal. There is minimal calcified plaque over the aorta. Remainder of the exam is unchanged.  IMPRESSION: No active cardiopulmonary disease.   Electronically Signed   By: [name] Olp M.D.   On: 09/11/2014 08:15     Medications:     . amLODipine  10 mg Oral Daily  . aspirin EC  81 mg Oral Daily  . calcium carbonate  400 mg of elemental calcium Oral TID WC  . cloNIDine  0.1 mg Oral BID  . ferrous sulfate  325 mg Oral Q breakfast  . gabapentin  100 mg Oral QHS  . heparin  5,000 Units Subcutaneous 3 times per day  . hydrALAZINE  10 mg Intravenous Once  . insulin starter kit- pen needles  1 kit Other Once  . isosorbide mononitrate  30 mg Oral Daily  . multivitamin  1 tablet Oral QHS  . piperacillin-tazobactam (ZOSYN)  IV  2.25 g Intravenous 3 times per day  . sodium chloride  3 mL Intravenous Q12H  . vancomycin  125 mg Oral 4 times per day  . vancomycin  500 mg Intravenous Q M,W,F-HD   acetaminophen **OR** acetaminophen, albuterol, guaiFENesin, hydrALAZINE, menthol-cetylpyridinium, ondansetron **OR** ondansetron (ZOFRAN) IV, traMADol  Assessment/ Plan:  1. End-stage renal disease from progressive chronic kidney disease-presentation with pulmonary edema/shortness of breath and poor response to diuretic therapy. She is set up for outpatient hemodialysis at [facility] on a Monday/Wednesday/Friday schedule. We'll order for hemodialysis again tomorrow to stop She is status post creation of a left brachiocephalic fistula 2. Volume overload/pulmonary edema: Improving with initiation of dialysis-continue efforts at challenging dry weight  3. Anemia: On ESA and received 2 unit packed red cell transfusion yesterday dialysis. 4. Metabolic bone disease: Started on calcium carbonate for phosphorus  binding 5. Nutrition: On a renal vitamin, continue protein supplementation as needed.    LOS: [place] _0 _1 :46 AM

## 2014-09-11 NOTE — Progress Notes (Signed)
Pt refusing PO vancomycin. Pt states "its to sweet, makes my stomach hurt and makes me feel nauseous".

## 2014-09-12 DIAGNOSIS — A047 Enterocolitis due to Clostridium difficile: Secondary | ICD-10-CM

## 2014-09-12 DIAGNOSIS — I120 Hypertensive chronic kidney disease with stage 5 chronic kidney disease or end stage renal disease: Principal | ICD-10-CM

## 2014-09-12 DIAGNOSIS — J810 Acute pulmonary edema: Secondary | ICD-10-CM | POA: Insufficient documentation

## 2014-09-12 DIAGNOSIS — R509 Fever, unspecified: Secondary | ICD-10-CM | POA: Insufficient documentation

## 2014-09-12 DIAGNOSIS — R5081 Fever presenting with conditions classified elsewhere: Secondary | ICD-10-CM

## 2014-09-12 DIAGNOSIS — Z992 Dependence on renal dialysis: Secondary | ICD-10-CM

## 2014-09-12 DIAGNOSIS — Z89511 Acquired absence of right leg below knee: Secondary | ICD-10-CM

## 2014-09-12 LAB — CBC
HCT: 34.7 % — ABNORMAL LOW (ref 36.0–46.0)
Hemoglobin: 11 g/dL — ABNORMAL LOW (ref 12.0–15.0)
MCH: 29 pg (ref 26.0–34.0)
MCHC: 31.7 g/dL (ref 30.0–36.0)
MCV: 91.6 fL (ref 78.0–100.0)
Platelets: 180 10*3/uL (ref 150–400)
RBC: 3.79 MIL/uL — ABNORMAL LOW (ref 3.87–5.11)
RDW: 16 % — ABNORMAL HIGH (ref 11.5–15.5)
WBC: 8 10*3/uL (ref 4.0–10.5)

## 2014-09-12 LAB — GLUCOSE, CAPILLARY
Glucose-Capillary: 183 mg/dL — ABNORMAL HIGH (ref 70–99)
Glucose-Capillary: 109 mg/dL — ABNORMAL HIGH (ref 70–99)
Glucose-Capillary: 228 mg/dL — ABNORMAL HIGH (ref 70–99)
Glucose-Capillary: 174 mg/dL — ABNORMAL HIGH (ref 70–99)

## 2014-09-12 LAB — URINE CULTURE: Colony Count: 30000

## 2014-09-12 LAB — RENAL FUNCTION PANEL
Albumin: 1.9 g/dL — ABNORMAL LOW (ref 3.5–5.2)
Anion gap: 12 (ref 5–15)
BUN: 18 mg/dL (ref 6–23)
CO2: 25 mmol/L (ref 19–32)
Calcium: 7.8 mg/dL — ABNORMAL LOW (ref 8.4–10.5)
Chloride: 95 mEq/L — ABNORMAL LOW (ref 96–112)
Creatinine, Ser: 5.02 mg/dL — ABNORMAL HIGH (ref 0.50–1.10)
GFR calc Af Amer: 10 mL/min — ABNORMAL LOW (ref >90)
GFR calc non Af Amer: 8 mL/min — ABNORMAL LOW (ref >90)
Glucose, Bld: 196 mg/dL — ABNORMAL HIGH (ref 70–99)
Phosphorus: 3.4 mg/dL (ref 2.3–4.6)
Potassium: 3.4 mmol/L — ABNORMAL LOW (ref 3.5–5.1)
Sodium: 132 mmol/L — ABNORMAL LOW (ref 135–145)

## 2014-09-12 MED ORDER — NEPRO/CARBSTEADY PO LIQD: 237.0000 mL | ORAL | Status: DC | PRN

## 2014-09-12 MED ORDER — PENTAFLUOROPROP-TETRAFLUOROETH EX AERO: 1.0000 "application " | INHALATION_SPRAY | CUTANEOUS | Status: DC | PRN

## 2014-09-12 MED ORDER — HEPARIN SODIUM (PORCINE) 1000 UNIT/ML DIALYSIS: 1000.0000 [IU] | INTRAMUSCULAR | Status: DC | PRN

## 2014-09-12 MED ORDER — SODIUM CHLORIDE 0.9 % IV SOLN: 100.0000 mL | INTRAVENOUS | Status: DC | PRN

## 2014-09-12 MED ORDER — LIDOCAINE-PRILOCAINE 2.5-2.5 % EX CREA: 1.0000 "application " | TOPICAL_CREAM | CUTANEOUS | Status: DC | PRN

## 2014-09-12 MED ORDER — ALTEPLASE 2 MG IJ SOLR
2.0000 mg | Freq: Once | INTRAMUSCULAR | Status: DC | PRN
  Filled 2014-09-12: qty 2

## 2014-09-12 MED ORDER — LIDOCAINE HCL (PF) 1 % IJ SOLN: 5.0000 mL | INTRAMUSCULAR | Status: DC | PRN

## 2014-09-12 MED ORDER — PIPERACILLIN-TAZOBACTAM IN DEX 2-0.25 GM/50ML IV SOLN
2.2500 g | Freq: Three times a day (TID) | INTRAVENOUS | Status: DC
  Filled 2014-09-12 (×2): qty 50

## 2014-09-12 NOTE — Progress Notes (Addendum)
[facility] PROGRESS NOTE  Carolyn Valenzuela UKG:[id number] DOB: Aug 17, 1951 DOA: 09/01/2014 PCP: Cathlean Cower, MD  Interim summary Patient is a 64 year old female with a past medical history of near end-stage renal disease, type 2 diabetes mellitus, hypertension, status post right below-knee amputation who was admitted to the medicine service on 09/01/2014 presenting with complaints of shortness of breath, generalized weakness, have an overall functional decline. She was found to be C. difficile positive and started on Flagyl therapy. Lab work revealed further increase in her creatinine to 9.2 with BUN of 71. Her potassium was within normal limits at 4.8. Patient was seen and evaluated by nephrology recommended initiating renal replacement therapy. Vascular surgery was consulted as she underwent right internal jugular vein tunneled dialysis catheter placement, procedure performed on 09/03/2014. Hemodialysis to be performed after procedure. Vascular surgery was consulted as she underwent left brachiocephalic anterior venous fistula placement, procedure performed by Dr.Chen on 09/06/2014. Dr. Coralyn Pear had spoken to nephrology about discharging her on 09/07/2014. Nephrology agreed as she remains stable, appeared well, and had already been set up with hemodialysis in the outpatient setting. Just before discharge she was found to have a low grade temperature of 100.9. She was started on empiric IV antibiotic therapy. It was suspected that fever could be arising from C. difficile colitis versus developing pneumonia. Workup included a chest x-ray that showed opacities in bilateral infrahilar regions that could be atelectasis versus infectious. She was started on empiric IV antimicrobial therapy with vancomycin and Zosyn. She appeared well and nephrology felt that IV antimicrobial therapy could be given with hemodialysis in the outpatient setting. Discharge orders were placed, discharge summary dictated on 09/08/2014.  Family members, however, expressed concerns for taken her home stating that she appeared ill. She was kept overnight and found to have a temperature of 102.4, thus was kept in the hospital setting to receive antimicrobial therapy. She last spiked a temperature of 102.9 on 09/10/2014.   Assessment/Plan: 1. End-stage renal disease -Patient progressing to end-stage renal disease, presenting with pulmonary edema, minimally responsive diuretic therapy, lab work showing creatinine in the 9 point range. -Nephrology recommending starting renal replacement therapy -Vascular surgery was consulted, s/p right internal jugular vein tunneled dialysis catheter placement, procedure performed on 09/03/2014 -09/06/2014 patient taken to OR for placement of left brachiocephalic AV fistula placement  -Patient to continue hemodialysis on Mondays Wednesdays and Fridays - stable  2. Suspected healthcare associated pneumonia -Patient continued to spike temperatures, blood cultures hour obtained on 09/07/2014 show no growth to date 2 sets. No additional blood cultures seem to have been sent. -She was started on vancomycin and Zosyn on 09/07/2014  -Last chest x-ray was performed on 09/09/2014 which showed persistent minimal heterogeneous opacities involving left lung base second be secondary to infection. - No fevers since 09/10/14. However, given current episode of C. difficile colitis and risk of recurrence, will request infectious disease input regarding choice and duration of antibiotics prior to discharge. Chest x-ray 09/11/14: No acute findings   3.  C. difficile colitis. -Given her persistent fevers and diarrhea  substituted oral vancomycin for Flagyl. -It appears that there has been some clinical improvement since switching Flagyl for oral vancomycin. She has not spiked high fevers since 09/10/14. Diarrhea improving. - Not entirely clear if her febrile illness was secondary to C. difficile colitis versus HCAP.  Discussed with Dr. Lucianne Lei Dam/ID on 1/13 who will see patient later today. Agree DC'ing IV Vanc & Zosyn due to low index of suspicion for PNA  based on available data. If she does spike fever again off Abx, then she will need further work up.  4.  Pulmonary edema/shortness of breath -Likely secondary to end-stage renal disease -Patient was administered IV diuretics with poor response -Resolved with RRT  5.  Hyponatremia - Secondary to ESRD/dialysis. Stable.  6.  Hypoglycemia -Blood sugar stable, off of hypoglycemic medications  7. Hypokalemia - Management per nephrology.  8. Anemia secondary to chronic kidney disease - Stable  9. Uncontrolled hypertension - Continue amlodipine and? Titrate clonidine - defer to nephrology.  Code Status: Full code Family Communication: None at bedside. Disposition Plan: Plan for discharge home with [facility] when medically stable. Possibly 1/14.   Consultants:  Nephrology  Vascular surgery  Procedures:   Right internal jugular vein tunneled dialysis catheter placement  Left arm AV fistula placement.    HPI/Subjective: Patient was seen at HD this morning. States that her cough has improved-apparently productive of colored sputum couple days ago which is now clear. States that in the last 24-48 hours, diarrhea has significantly improved. Denies.no pain or dyspnea.  Objective: Filed Vitals:   09/12/14 0623 09/12/14 0715 09/12/14 0720 09/12/14 0735  BP: 172/59 182/75 173/70 173/68  Pulse: 64 66 65 64  Temp: 98.3 F (36.8 C) 98 F (36.7 C)    TempSrc: Oral     Resp: '18 18 22 19  ' Height:      Weight: 55.8 kg (123 lb 0.3 oz) 56 kg (123 lb 7.3 oz)    SpO2: 95% 95%       Intake/Output Summary (Last 24 hours) at 09/12/14 0756 Last data filed at 09/12/14 5053  Gross per 24 hour  Intake   1280 ml  Output    125 ml  Net   1155 ml   Filed Weights   09/11/14 0619 09/12/14 0623 09/12/14 0715  Weight: 55.1 kg (121 lb 7.6 oz) 55.8 kg (123 lb 0.3  oz) 56 kg (123 lb 7.3 oz)    Exam:   General: Middle-aged frail pleasant female lying comfortably on bed undergoing hemodialysis this morning.  Cardiovascular: Regular rate and rhythm normal S1-S2. No JVD, murmurs or pedal edema.  Respiratory: Diminished breath sounds in the bases but otherwise clear to auscultation. No increased work of breathing.  Abdomen: Soft nontender nondistended  Extremities: Right BKA stump healed. No pedal edema. Symmetric 5 x 5 power.  CNS: Alert and oriented. No focal neurological deficits.  Data Reviewed: Basic Metabolic Panel:  Recent Labs Lab 09/08/14 0352 09/09/14 0413 09/10/14 0249 09/11/14 0500 09/12/14 0452  NA 135 136 133* 133* 132*  K 4.2 3.3* 4.0 3.7 3.4*  CL 103 100 102 102 95*  CO2 '25 26 21 26 25  ' GLUCOSE 154* 105* 167* 128* 196*  BUN '13 7 17 12 18  ' CREATININE 5.05* 3.34* 4.89* 3.84* 5.02*  CALCIUM 7.7* 7.9* 7.5* 7.3* 7.8*  PHOS 4.0 3.1 4.8* 3.6 3.4   Liver Function Tests:  Recent Labs Lab 09/08/14 0352 09/09/14 0413 09/10/14 0249 09/11/14 0500 09/12/14 0452  ALBUMIN 2.0* 2.1* 2.1* 2.0* 1.9*   No results for input(s): LIPASE, AMYLASE in the last 168 hours. No results for input(s): AMMONIA in the last 168 hours. CBC:  Recent Labs Lab 09/06/14 0358 09/08/14 0352 09/12/14 0700  WBC 8.8 11.3* 8.0  HGB 10.1* 9.5* 11.0*  HCT 31.4* 31.0* 34.7*  MCV 89.0 92.0 91.6  PLT 242 207 180   Cardiac Enzymes: No results for input(s): CKTOTAL, CKMB, CKMBINDEX, TROPONINI in the  last 168 hours. BNP (last 3 results) No results for input(s): PROBNP in the last 8760 hours. CBG:  Recent Labs Lab 09/11/14 0622 09/11/14 1131 09/11/14 1724 09/11/14 2119 09/12/14 0619  GLUCAP 135* 145* 157* 196* 183*    Recent Results (from the past 240 hour(s))  Clostridium Difficile by PCR     Status: Abnormal   Collection Time: 09/02/14  7:59 AM  Result Value Ref Range Status   C difficile by pcr POSITIVE (A) NEGATIVE Final    Comment:  CRITICAL RESULT CALLED TO, READ BACK BY AND VERIFIED WITH: E.MILIANO,RN 09/02/14 '@1110'  BY V.WILKINS   Culture, blood (routine x 2)     Status: None (Preliminary result)   Collection Time: 09/07/14  3:10 PM  Result Value Ref Range Status   Specimen Description BLOOD RIGHT ARM  Final   Special Requests BOTTLES DRAWN AEROBIC AND ANAEROBIC 5CC  Final   Culture   Final           BLOOD CULTURE RECEIVED NO GROWTH TO DATE CULTURE WILL BE HELD FOR 5 DAYS BEFORE ISSUING A FINAL NEGATIVE REPORT Performed at [facility]    Report Status PENDING  Incomplete  Culture, blood (routine x 2)     Status: None (Preliminary result)   Collection Time: 09/07/14  3:15 PM  Result Value Ref Range Status   Specimen Description BLOOD RIGHT HAND  Final   Special Requests BOTTLES DRAWN AEROBIC ONLY 10CC  Final   Culture   Final           BLOOD CULTURE RECEIVED NO GROWTH TO DATE CULTURE WILL BE HELD FOR 5 DAYS BEFORE ISSUING A FINAL NEGATIVE REPORT Performed at [facility]    Report Status PENDING  Incomplete     Studies: Dg Chest 2 View  09/11/2014   CLINICAL DATA:  Shortness of breath, pneumonia/HCAP  EXAM: CHEST  2 VIEW  COMPARISON:  09/09/2014 and 09/07/2014  FINDINGS: Right IJ central venous catheter unchanged with tip over the right atrium. Lungs are adequately inflated without focal consolidation or effusion. Cardiomediastinal silhouette is within normal. There is minimal calcified plaque over the aorta. Remainder of the exam is unchanged.  IMPRESSION: No active cardiopulmonary disease.   Electronically Signed   By: [name] Olp M.D.   On: 09/11/2014 08:15    Scheduled Meds: . amLODipine  10 mg Oral Daily  . aspirin EC  81 mg Oral Daily  . calcium carbonate  400 mg of elemental calcium Oral TID WC  . cloNIDine  0.1 mg Oral BID  . ferrous sulfate  325 mg Oral Q breakfast  . gabapentin  100 mg Oral QHS  . heparin  5,000 Units Subcutaneous 3 times per day  . hydrALAZINE  10 mg  Intravenous Once  . insulin starter kit- pen needles  1 kit Other Once  . isosorbide mononitrate  30 mg Oral Daily  . multivitamin  1 tablet Oral QHS  . piperacillin-tazobactam (ZOSYN)  IV  2.25 g Intravenous 3 times per day  . sodium chloride  3 mL Intravenous Q12H  . vancomycin  125 mg Oral 4 times per day  . vancomycin  500 mg Intravenous Q M,W,F-HD   Continuous Infusions:   Principal Problem:   Pulmonary edema Active Problems:   Essential hypertension   End stage renal disease   S/P BKA (below knee amputation)   Type 2 diabetes mellitus with ESRD (end-stage renal disease)   Anemia of renal disease   Acute on  chronic renal failure   ESRD needing dialysis   HCAP (healthcare-associated pneumonia)   Fever    Time spent: 30 min   Aracelia Brinson, MD, FACP, FHM. [facility] Pager [phone]  If 7PM-7AM, please contact night-coverage [email or web address] Password [email or web address] 09/12/2014, 8:05 AM    LOS: 11 days

## 2014-09-12 NOTE — Progress Notes (Signed)
Inpatient Diabetes Program Recommendations  AACE/ADA: New Consensus Statement on Inpatient Glycemic Control (2013)  Target Ranges:  Prepandial:   less than 140 mg/dL      Peak postprandial:   less than 180 mg/dL (1-2 hours)      Critically ill patients:  140 - 180 mg/dL   Results for JOMANDA, BOWLEY (MRN [id number]) as of 09/12/2014 13:09  Ref. Range 09/11/2014 06:22 09/11/2014 11:31 09/11/2014 17:24 09/11/2014 21:19 09/12/2014 06:19 09/12/2014 11:32  Glucose-Capillary Latest Range: 70-99 mg/dL 135 (H) 145 (H) 157 (H) 196 (H) 183 (H) 109 (H)   Diabetes history: DM2 Outpatient Diabetes medications: Amaryl 2 mg daily Current orders for Inpatient glycemic control: CBGs ACHS  Inpatient Diabetes Program Recommendations Correction (SSI): Noted CBGs ranged from 135-196 mg/dl on 09/12/14 and fasting glucose 183 mg/dl today. May want to consider ordering CBGs with Novolog sensitive correction scale TID with meals.  Thanks, Barnie Alderman, RN, MSN, CCRN, CDE Diabetes Coordinator Inpatient Diabetes Program [phone] (Team Pager) [phone] ([facility] office) [phone] [facility] office)

## 2014-09-12 NOTE — Consult Note (Addendum)
[facility]    Date of Admission:  09/01/2014  Date of Consult:  09/12/2014  Reason for Consult: FUO, C difficile colitis Referring Physician: Dr. Algis Liming   HPI: Carolyn Valenzuela is an 64 y.o. female. With history of end-stage renal disease diabetes mellitus hypertension history of below the knee amputation was admitted to hospitalist service and the second with complaints of shortness of breath and generalized weakness and functional decline. Along the way a C. difficile PCR was checked and was positive and she was started on Flagyl. Her creatinine had risen to 9.2 and BUN to 7.1 patient was evaluated by nephrology and and was ultimately placed on hemodialysis with a new tunneled dialysis catheter placed on 09/03/2014. She also had brachiocephalic anterior venous fistula placed by Dr. Bridgett Larsson on the seventh. By the eighth prior to discharge she developed fevers and was evaluated with blood cultures, urine cultures (which grew yeast) chest x-ray which was read as possibly showing some perihilar bilateral infiltrates and she was started on therapy for possible healthcare associated pneumonia with vancomycin and Zosyn. She was again approaching discharge on IV antibiotics when she became febrile to 102.4. She was kept in the hospital and we are ultimately consulted to assist in her workup. In the interim her chest x-ray has been found to show no evidence of infiltrates. I asked Dr. Algis Liming to stop the patients IV vancomycin and zosyn as it was not clear to me what was being treated and I was concerned that such broad spectrum abx would worsen C diffiicile infection.    Past Medical History  Diagnosis Date  . DIABETES MELLITUS, UNCONTROLLED 05/21/2009  . HYPERLIPIDEMIA 03/30/2007  . ANEMIA-IRON DEFICIENCY 01/25/2008  . HYPERTENSION 01/25/2008  . SINUSITIS- ACUTE-NOS 01/25/2008  . SECONDARY HYPERPARATHYROIDISM 05/21/2009  . RENAL INSUFFICIENCY 02/01/2008  . CERVICAL  RADICULOPATHY, LEFT 01/25/2008  . BACK PAIN 05/02/2009  . NUMBNESS 05/21/2009  . Allergic rhinitis, cause unspecified 12/08/2010  . Foot ulcer     right lateral malleolus  . Critical lower limb ischemia     Past Surgical History  Procedure Laterality Date  . Av fistula placement Left   . Eye surgery Bilateral     cataracts removed, left eye still has some oil in it.  . Colonoscopy    . Amputation Right 03/15/2014    Procedure: RIGHT  LEG  BELOW KNEE AMPUTATION ;  Surgeon: [name], MD;  Location: [facility];  Service: Orthopedics;  Laterality: Right;  . Insertion of dialysis catheter N/A 09/03/2014    Procedure: INSERTION OF DIALYSIS CATHETER RIGHT INTERNAL JUGULAR VEIN;  Surgeon: Conrad Corrales, MD;  Location: [facility];  Service: Vascular;  Laterality: N/A;  . Av fistula placement Left 09/06/2014    Procedure: ARTERIOVENOUS (AV) FISTULA CREATION-Left Brachiocephalic;  Surgeon: Conrad West Ishpeming, MD;  Location: [facility];  Service: Vascular;  Laterality: Left;  ergies:   Allergies  Allergen Reactions  . Oxycodone Itching     Medications: I have reviewed patients current medications as documented in Epic Anti-infectives    Start     Dose/Rate Route Frequency Ordered Stop   09/12/14 0900  piperacillin-tazobactam (ZOSYN) IVPB 2.25 g  Status:  Discontinued     2.25 g100 mL/hr over 30 Minutes Intravenous 3 times per day 09/12/14 0845 09/12/14 0846   09/10/14 1800  vancomycin (VANCOCIN) 50 mg/mL oral solution 125 mg     125 mg Oral 4 times per day 09/10/14 1730  09/10/14 1200  vancomycin (VANCOCIN) 500 mg in sodium chloride 0.9 % 100 mL IVPB  Status:  Discontinued     500 mg100 mL/hr over 60 Minutes Intravenous Every M-W-F (Hemodialysis) 09/07/14 1532 09/12/14 0815   09/08/14 1200  vancomycin (VANCOCIN) 500 mg in sodium chloride 0.9 % 100 mL IVPB     500 mg100 mL/hr over 60 Minutes Intravenous Every T-Th-Sa (Hemodialysis) 09/08/14 1108 09/08/14 1748   09/08/14 0000  metroNIDAZOLE (FLAGYL) 500 MG tablet       500 mg Oral 3 times daily 09/08/14 1207     09/07/14 1600  piperacillin-tazobactam (ZOSYN) IVPB 2.25 g  Status:  Discontinued     2.25 g100 mL/hr over 30 Minutes Intravenous 3 times per day 09/07/14 1532 09/12/14 0815   09/07/14 1545  vancomycin (VANCOCIN) 1,250 mg in sodium chloride 0.9 % 250 mL IVPB     1,250 mg166.7 mL/hr over 90 Minutes Intravenous  Once 09/07/14 1532 09/07/14 1959   09/06/14 0515  [MAR Hold]  ceFAZolin (ANCEF) IVPB 1 g/50 mL premix     (MAR Hold since 09/06/14 1053)  Comments:  Send with pt to OR   1 g100 mL/hr over 30 Minutes Intravenous On call 09/06/14 0506 09/06/14 1132   09/06/14 0400  ceFAZolin (ANCEF) IVPB 1 g/50 mL premix    Comments:  Send with pt to OR   1 g100 mL/hr over 30 Minutes Intravenous On call 09/05/14 1344 09/06/14 0335   09/02/14 1400  metroNIDAZOLE (FLAGYL) tablet 500 mg  Status:  Discontinued     500 mg Oral 3 times per day 09/02/14 1117 09/10/14 1730      Social History:  reports that she has never smoked. She has never used smokeless tobacco. She reports that she does not drink alcohol or use illicit drugs.  Family History  Problem Relation Age of Onset  . Cancer Mother     Pancreatic    As in HPI and primary teams notes otherwise 12 point review of systems is negative  Blood pressure 172/59, pulse 74, temperature 99.2 F (37.3 C), temperature source Oral, resp. rate 18, height 5' (1.524 m), weight 116 lb 10 oz (52.9 kg), SpO2 95 %. General: Alert and awake, oriented x3, not in any acute distress bout eat her lunch  HEENT: , EOMI, oropharynx clear and without exudate CVS regular rate, normal r,  no murmur rubs or gallops Chest: Fairly clear to auscultation bilaterally, no wheezing, rales or rhonchi Abdomen: soft nontender, nondistended, normal bowel sounds, Extremities: no  clubbing or edema noted bilaterally Skin: no rashes, her BKA site is clean on the right, no obvious DFU on the left Neuro: nonfocal   Results for orders  placed or performed during the hospital encounter of 09/01/14 (from the past 48 hour(s))  Glucose, capillary     Status: Abnormal   Collection Time: 09/10/14  8:53 PM  Result Value Ref Range   Glucose-Capillary 158 (H) 70 - 99 mg/dL   Comment 1 Notify RN    Comment 2 Documented in Chart   Renal function panel     Status: Abnormal   Collection Time: 09/11/14  5:00 AM  Result Value Ref Range   Sodium 133 (L) 135 - 145 mmol/L    Comment: Please note change in reference range.   Potassium 3.7 3.5 - 5.1 mmol/L    Comment: Please note change in reference range.   Chloride 102 96 - 112 mEq/L   CO2 26 19 - 32 mmol/L  Glucose, Bld 128 (H) 70 - 99 mg/dL   BUN 12 6 - 23 mg/dL   Creatinine, Ser 3.84 (H) 0.50 - 1.10 mg/dL   Calcium 7.3 (L) 8.4 - 10.5 mg/dL   Phosphorus 3.6 2.3 - 4.6 mg/dL   Albumin 2.0 (L) 3.5 - 5.2 g/dL   GFR calc non Af Amer 12 (L) >90 mL/min   GFR calc Af Amer 13 (L) >90 mL/min    Comment: (NOTE) The eGFR has been calculated using the CKD EPI equation. This calculation has not been validated in all clinical situations. eGFR's persistently <90 mL/min signify possible Chronic Kidney Disease.    Anion gap 5 5 - 15  Glucose, capillary     Status: Abnormal   Collection Time: 09/11/14  6:22 AM  Result Value Ref Range   Glucose-Capillary 135 (H) 70 - 99 mg/dL  Culture, Urine     Status: None   Collection Time: 09/11/14  6:26 AM  Result Value Ref Range   Specimen Description URINE, CLEAN CATCH    Special Requests Vancomycin, Zosyn    Colony Count      30,000 COLONIES/ML Performed at [facility] Performed at [facility]     Report Status 09/12/2014 FINAL   Glucose, capillary     Status: Abnormal   Collection Time: 09/11/14 11:31 AM  Result Value Ref Range   Glucose-Capillary 145 (H) 70 - 99 mg/dL  Glucose, capillary     Status: Abnormal   Collection Time: 09/11/14  5:24 PM  Result Value Ref Range   Glucose-Capillary 157 (H) 70 -  99 mg/dL  Glucose, capillary     Status: Abnormal   Collection Time: 09/11/14  9:19 PM  Result Value Ref Range   Glucose-Capillary 196 (H) 70 - 99 mg/dL  Renal function panel     Status: Abnormal   Collection Time: 09/12/14  4:52 AM  Result Value Ref Range   Sodium 132 (L) 135 - 145 mmol/L    Comment: Please note change in reference range.   Potassium 3.4 (L) 3.5 - 5.1 mmol/L    Comment: Please note change in reference range.   Chloride 95 (L) 96 - 112 mEq/L   CO2 25 19 - 32 mmol/L   Glucose, Bld 196 (H) 70 - 99 mg/dL   BUN 18 6 - 23 mg/dL   Creatinine, Ser 5.02 (H) 0.50 - 1.10 mg/dL   Calcium 7.8 (L) 8.4 - 10.5 mg/dL   Phosphorus 3.4 2.3 - 4.6 mg/dL   Albumin 1.9 (L) 3.5 - 5.2 g/dL   GFR calc non Af Amer 8 (L) >90 mL/min   GFR calc Af Amer 10 (L) >90 mL/min    Comment: (NOTE) The eGFR has been calculated using the CKD EPI equation. This calculation has not been validated in all clinical situations. eGFR's persistently <90 mL/min signify possible Chronic Kidney Disease.    Anion gap 12 5 - 15  Glucose, capillary     Status: Abnormal   Collection Time: 09/12/14  6:19 AM  Result Value Ref Range   Glucose-Capillary 183 (H) 70 - 99 mg/dL  CBC     Status: Abnormal   Collection Time: 09/12/14  7:00 AM  Result Value Ref Range   WBC 8.0 4.0 - 10.5 K/uL   RBC 3.79 (L) 3.87 - 5.11 MIL/uL   Hemoglobin 11.0 (L) 12.0 - 15.0 g/dL   HCT 34.7 (L) 36.0 - 46.0 %   MCV 91.6 78.0 -  100.0 fL   MCH 29.0 26.0 - 34.0 pg   MCHC 31.7 30.0 - 36.0 g/dL   RDW 16.0 (H) 11.5 - 15.5 %   Platelets 180 150 - 400 K/uL  Glucose, capillary     Status: Abnormal   Collection Time: 09/12/14 11:32 AM  Result Value Ref Range   Glucose-Capillary 109 (H) 70 - 99 mg/dL   Comment 1 Documented in Chart    Comment 2 Notify RN   Glucose, capillary     Status: Abnormal   Collection Time: 09/12/14  4:28 PM  Result Value Ref Range   Glucose-Capillary 228 (H) 70 - 99 mg/dL   Comment 1 Documented in Chart     Comment 2 Notify RN    '@BRIEFLABTABLE' (sdes,specrequest,cult,reptstatus)   ) Recent Results (from the past 720 hour(s))  Clostridium Difficile by PCR     Status: Abnormal   Collection Time: 09/02/14  7:59 AM  Result Value Ref Range Status   C difficile by pcr POSITIVE (A) NEGATIVE Final    Comment: CRITICAL RESULT CALLED TO, READ BACK BY AND VERIFIED WITH: E.MILIANO,RN 09/02/14 '@1110'  BY V.WILKINS   Culture, blood (routine x 2)     Status: None (Preliminary result)   Collection Time: 09/07/14  3:10 PM  Result Value Ref Range Status   Specimen Description BLOOD RIGHT ARM  Final   Special Requests BOTTLES DRAWN AEROBIC AND ANAEROBIC 5CC  Final   Culture   Final           BLOOD CULTURE RECEIVED NO GROWTH TO DATE CULTURE WILL BE HELD FOR 5 DAYS BEFORE ISSUING A FINAL NEGATIVE REPORT Performed at [facility]    Report Status PENDING  Incomplete  Culture, blood (routine x 2)     Status: None (Preliminary result)   Collection Time: 09/07/14  3:15 PM  Result Value Ref Range Status   Specimen Description BLOOD RIGHT HAND  Final   Special Requests BOTTLES DRAWN AEROBIC ONLY 10CC  Final   Culture   Final           BLOOD CULTURE RECEIVED NO GROWTH TO DATE CULTURE WILL BE HELD FOR 5 DAYS BEFORE ISSUING A FINAL NEGATIVE REPORT Performed at [facility]    Report Status PENDING  Incomplete  Culture, Urine     Status: None   Collection Time: 09/11/14  6:26 AM  Result Value Ref Range Status   Specimen Description URINE, CLEAN CATCH  Final   Special Requests Vancomycin, Zosyn  Final   Colony Count   Final    30,000 COLONIES/ML Performed at [facility]    Culture YEAST Performed at [facility]   Final   Report Status 09/12/2014 FINAL  Final     Impression/Recommendation  Principal Problem:   Pulmonary edema Active Problems:   Essential hypertension   End stage renal disease   S/P BKA (below knee amputation)   Type 2 diabetes mellitus with ESRD  (end-stage renal disease)   Anemia of renal disease   Acute on chronic renal failure   ESRD needing dialysis   HCAP (healthcare-associated pneumonia)   Fever   Carolyn Valenzuela is a 64 y.o. female with  ESRD now on  HD who was being rx for CDI and then developed fevers after placement of HD catheter and Vascular surgery that were thought to be due to HCAP  #1 FUO:  DC her vancomycin and zosyn and observe.  If she becomes febrile again would get CT  of chest abdomen and pelvis and obtain blood cultures  #2 Clostridium difficile: change to po vancomycin. Due to her her elevated CR would meet criteria for severe CDI  #3 Screening: she needs screening for HIV by CDC criteria and being AA female in [place] is of particularly high risk. Labs would also be part of battery of workup for FUO  09/12/2014, 5:38 PM   Thank you so much for this interesting consult  [facility] [place] [phone] (pager) [phone] (office) 09/12/2014, 5:38 PM  Rhina [place] 09/12/2014, 5:38 PM

## 2014-09-12 NOTE — Progress Notes (Signed)
I have seen and examined this patient and agree with the plan of care . Seen on dialysis no complaints [name] W 09/12/2014, 10:51 AM

## 2014-09-12 NOTE — Progress Notes (Signed)
CSW spoke with patient to review transportation arrangements once she is d/c'd home for her dialysis treatments on M,W,F (2nd shift). Her niece had set up with transportation arrangements through [employer] and [employer]- however patient prefers to use [employer]  [phone].  CSW spoke with Olivia Mackie at [employer] above and relayed patient's wishes. She stated that the transportation arrangements will be changed to [employer] with anticipated d/c tomorrow to home and initiation of dialysis on Friday.  Patient and sister [name] notified of above. Also notified patient's nurse- Sanders. No further CSW needs identified. CSW signing off.  Lorie Phenix. Pauline Good, [place]

## 2014-09-13 ENCOUNTER — Other Ambulatory Visit: Payer: Self-pay

## 2014-09-13 ENCOUNTER — Ambulatory Visit: Payer: BC Managed Care – PPO | Attending: Physician Assistant | Admitting: Physical Therapy

## 2014-09-13 DIAGNOSIS — Z89511 Acquired absence of right leg below knee: Secondary | ICD-10-CM | POA: Insufficient documentation

## 2014-09-13 DIAGNOSIS — Z5189 Encounter for other specified aftercare: Secondary | ICD-10-CM | POA: Insufficient documentation

## 2014-09-13 DIAGNOSIS — N186 End stage renal disease: Secondary | ICD-10-CM | POA: Insufficient documentation

## 2014-09-13 DIAGNOSIS — E1122 Type 2 diabetes mellitus with diabetic chronic kidney disease: Secondary | ICD-10-CM | POA: Insufficient documentation

## 2014-09-13 DIAGNOSIS — I120 Hypertensive chronic kidney disease with stage 5 chronic kidney disease or end stage renal disease: Secondary | ICD-10-CM | POA: Insufficient documentation

## 2014-09-13 DIAGNOSIS — I998 Other disorder of circulatory system: Secondary | ICD-10-CM | POA: Insufficient documentation

## 2014-09-13 DIAGNOSIS — R5381 Other malaise: Secondary | ICD-10-CM | POA: Insufficient documentation

## 2014-09-13 DIAGNOSIS — E213 Hyperparathyroidism, unspecified: Secondary | ICD-10-CM | POA: Insufficient documentation

## 2014-09-13 DIAGNOSIS — M7989 Other specified soft tissue disorders: Secondary | ICD-10-CM

## 2014-09-13 DIAGNOSIS — R269 Unspecified abnormalities of gait and mobility: Secondary | ICD-10-CM | POA: Insufficient documentation

## 2014-09-13 LAB — RENAL FUNCTION PANEL
Albumin: 2 g/dL — ABNORMAL LOW (ref 3.5–5.2)
Anion gap: 10 (ref 5–15)
BUN: 8 mg/dL (ref 6–23)
CO2: 26 mmol/L (ref 19–32)
Calcium: 7.8 mg/dL — ABNORMAL LOW (ref 8.4–10.5)
Chloride: 99 mEq/L (ref 96–112)
Creatinine, Ser: 3.76 mg/dL — ABNORMAL HIGH (ref 0.50–1.10)
GFR calc Af Amer: 14 mL/min — ABNORMAL LOW (ref >90)
GFR calc non Af Amer: 12 mL/min — ABNORMAL LOW (ref >90)
Glucose, Bld: 191 mg/dL — ABNORMAL HIGH (ref 70–99)
Phosphorus: 2.7 mg/dL (ref 2.3–4.6)
Potassium: 3.7 mmol/L (ref 3.5–5.1)
Sodium: 135 mmol/L (ref 135–145)

## 2014-09-13 LAB — CULTURE, BLOOD (ROUTINE X 2)
Culture: NO GROWTH
Culture: NO GROWTH

## 2014-09-13 LAB — HEPATITIS PANEL, ACUTE
HCV Ab: NEGATIVE
Hep A IgM: NONREACTIVE
Hep B C IgM: NONREACTIVE
Hepatitis B Surface Ag: NEGATIVE

## 2014-09-13 LAB — SEDIMENTATION RATE: Sed Rate: 67 mm/hr — ABNORMAL HIGH (ref 0–22)

## 2014-09-13 LAB — GLUCOSE, CAPILLARY
Glucose-Capillary: 178 mg/dL — ABNORMAL HIGH (ref 70–99)
Glucose-Capillary: 248 mg/dL — ABNORMAL HIGH (ref 70–99)

## 2014-09-13 LAB — C-REACTIVE PROTEIN: CRP: 1.2 mg/dL — ABNORMAL HIGH (ref <0.60)

## 2014-09-13 MED ORDER — ISOSORBIDE MONONITRATE ER 30 MG PO TB24: 30.0000 mg | ORAL_TABLET | Freq: Every day | ORAL | Status: DC

## 2014-09-13 MED ORDER — CLONIDINE HCL 0.1 MG PO TABS: 0.1000 mg | ORAL_TABLET | Freq: Two times a day (BID) | ORAL | Status: DC

## 2014-09-13 MED ORDER — GABAPENTIN 100 MG PO CAPS: 100.0000 mg | ORAL_CAPSULE | Freq: Every day | ORAL | Status: DC

## 2014-09-13 MED ORDER — ACETAMINOPHEN 325 MG PO TABS: 650.0000 mg | ORAL_TABLET | Freq: Four times a day (QID) | ORAL | Status: DC | PRN

## 2014-09-13 MED ORDER — VANCOMYCIN 50 MG/ML ORAL SOLUTION: 125.0000 mg | Freq: Four times a day (QID) | ORAL | Status: DC

## 2014-09-13 NOTE — Progress Notes (Signed)
Pt. IV D/C'd by CNA/Nurse Tech, D/C paperwork reviewed with pt. And paperwork and prescriptions given to pt., pt. Stable and showed no signs or symptoms of distress, pt. Stated her ride would be here later in the shift, report given to oncoming nurse while pt. Waited on her ride home.

## 2014-09-13 NOTE — Progress Notes (Signed)
UR completed [facility] [place] K. Hawa Henly, RN, BSN, [place], CCM  09/13/2014 3:53 PM

## 2014-09-13 NOTE — Discharge Summary (Signed)
Physician Discharge Summary  Carolyn Valenzuela [id number] DOB: December 17, 1950 DOA: 09/01/2014  PCP: Cathlean Cower, MD  Admit date: 09/01/2014 Discharge date: 09/13/2014  Time spent: Greater than 30 minutes  Recommendations for Outpatient Follow-up:  1. Dr. Adele Barthel, Vascular surgery in 4 weeks. MDs office will arrange appointment. 2. Dr. Elmarie Shiley, Nephrology 3. Home [facility]-RN, aide and PT. 4. Dr. Cathlean Cower, PCP in 5 days 5. [facility] Keep up regular hemodialysis appointments on Monday/Wednesday/Friday: Nephrologist alerted of patient's discharge today. CBC and renal panel can be periodically followed across dialysis.  Discharge Diagnoses:  Principal Problem:   Pulmonary edema Active Problems:   Essential hypertension   End stage renal disease   S/P BKA (below knee amputation)   Type 2 diabetes mellitus with ESRD (end-stage renal disease)   Anemia of renal disease   Acute on chronic renal failure   ESRD needing dialysis   HCAP (healthcare-associated pneumonia)   Fever   Acute pulmonary edema   FUO (fever of unknown origin)   Discharge Condition: Improved & Stable  Diet recommendation: Heart healthy and diabetic diet.  Filed Weights   09/12/14 0715 09/12/14 1048 09/13/14 0604  Weight: 56 kg (123 lb 7.3 oz) 52.9 kg (116 lb 10 oz) 52.6 kg (115 lb 15.4 oz)    History of present illness:  Patient is a 64 year old female with a past medical history of near end-stage renal disease, type 2 diabetes mellitus, hypertension, status post right below-knee amputation who was admitted to the medicine service on 09/01/2014 presenting with complaints of shortness of breath, generalized weakness, have an overall functional decline. She was found to be C. difficile positive and started on Flagyl therapy. Lab work revealed further increase in her creatinine to 9.2 with BUN of 71. Her potassium was within normal limits at 4.8. Patient was seen and evaluated by  nephrology recommended initiating renal replacement therapy. Vascular surgery was consulted as she underwent right internal jugular vein tunneled dialysis catheter placement, procedure performed on 09/03/2014. Hemodialysis to be performed after procedure. Vascular surgery was consulted as she underwent left brachiocephalic anterior venous fistula placement, procedure performed by Dr.Chen on 09/06/2014. Dr. Coralyn Pear had spoken to nephrology about discharging her on 09/07/2014. Nephrology agreed as she remains stable, appeared well, and had already been set up with hemodialysis in the outpatient setting. Just before discharge she was found to have a low grade temperature of 100.9. She was started on empiric IV antibiotic therapy. It was suspected that fever could be arising from C. difficile colitis versus developing pneumonia. Workup included a chest x-ray that showed opacities in bilateral infrahilar regions that could be atelectasis versus infectious. She was started on empiric IV antimicrobial therapy with vancomycin and Zosyn. She appeared well and nephrology felt that IV antimicrobial therapy could be given with hemodialysis in the outpatient setting. Discharge orders were placed, discharge summary dictated on 09/08/2014. Family members, however, expressed concerns for taken her home stating that she appeared ill. She was kept overnight and found to have a temperature of 102.4, thus was kept in the hospital setting to receive antimicrobial therapy. She last spiked a temperature of 102.9 on 09/10/2014. Fever resolved  Hospital Course:    End-stage renal disease -Patient progressedto end-stage renal disease, presenting with pulmonary edema, minimally responsive diuretic therapy, lab work showing creatinine in the 9 point range. -Nephrology recommended starting renal replacement therapy -Vascular surgery was consulted, s/p right internal jugular vein tunneled dialysis catheter placement, procedure performed on  09/03/2014 -09/06/2014 patient taken to OR for placement of left brachiocephalic AV fistula placement  -Patient to continue hemodialysis on Mondays Wednesdays and Fridays - stable. Discussed with Dr. Senaida Lange who has cleared her for discharge home and will arrange for outpatient dialysis follow-up.  2. Suspected healthcare associated pneumonia -Patient continued to spike temperatures, blood cultures hour obtained on 09/07/2014 show no growth-final report. No additional blood cultures seem to have been sent. -She was started on vancomycin and Zosyn on 09/07/2014  -Last chest x-ray was performed on 09/09/2014 which showed persistent minimal heterogeneous opacities involving left lung base second be secondary to infection. - No fevers since 09/10/14. Chest x-ray 09/11/14: No acute findings. -Patient's fever was most likely secondary to C. difficile colitis. Low clinical index of suspicion for pneumonia. Infectious disease input appreciated and discontinued IV vancomycin (last dose 1/11) and Zosyn (last dose 1/12). Patient has remained afebrile. Discussed with infectious disease M.D. Dr. Tommy Medal who has cleared her for discharge On 14 days of oral vancomycin for C. Difficile. - Avoid unnecessary antibiotics. Advised patient that if she spikes fever again, she will need to be worked up extensively. She is advised to discuss with her PCP or present to ED in case she has recurrent fevers or worsening diarrhea. She verbalizes understanding.   3. C. difficile colitis. - Infectious disease consultation appreciated. Started on oral vancomycin and will complete total 14 day course. Diarrhea significantly improved.  4. Pulmonary edema/shortness of breath -Likely secondary to end-stage renal disease -Patient was administered IV diuretics with poor response -Resolved with RRT  5. Hyponatremia - Secondary to ESRD/dialysis. Stable.  6.Uncontrolled type II DM with renal complications/hypoglycemia patient had  transient hypoglycemia early on in admission (1/3) but lately after dialysis initiation, has been hyperglycemic 109-248 mg per DL range. We'll resume Amaryl and monitor closely  7. Hypokalemia -Replaced   8. Anemia secondary to chronic kidney disease - Stable  9. Uncontrolled hypertension - Continue amlodipine and Clonidine. Consider outpatient titration.    Consultants:  Nephrology  Vascular surgery  Procedures:  Right internal jugular vein tunneled dialysis catheter placement  Left arm AV fistula placement   Discharge Exam:  Complaints:  Denies complaints. Anxious to go home. Diarrhea significantly improved. Had small BM this morning.   Filed Vitals:   09/12/14 2138 09/13/14 0059 09/13/14 0604 09/13/14 1100  BP: 150/60  154/51 176/60  Pulse: 75  61 70  Temp: 98.4 F (36.9 C) 98.3 F (36.8 C) 98.5 F (36.9 C) 98.6 F (37 C)  TempSrc: Oral Oral Oral Oral  Resp: 20  18 18   Height:      Weight:   52.6 kg (115 lb 15.4 oz)   SpO2: 98%  98% 95%    General: Middle-aged frail pleasant female lying comfortably on bed.  Cardiovascular: Regular rate and rhythm normal S1-S2. No JVD, murmurs or pedal edema. Telemetry: Sinus rhythm.  Respiratory: clear to auscultation. No increased work of breathing.  Abdomen: Soft nontender nondistended  Extremities: Right BKA stump healed. No pedal edema. Symmetric 5 x 5 power.  CNS: Alert and oriented. No focal neurological deficits.  Discharge Instructions      Discharge Instructions    Call MD for:  difficulty breathing, headache or visual disturbances    Complete by:  As directed      Call MD for:  extreme fatigue    Complete by:  As directed      Call MD for:  hives    Complete by:  As directed      Call MD for:  persistant dizziness or light-headedness    Complete by:  As directed      Call MD for:  persistant nausea and vomiting    Complete by:  As directed      Call MD for:  redness, tenderness, or signs of  infection (pain, swelling, redness, odor or green/yellow discharge around incision site)    Complete by:  As directed      Call MD for:  severe uncontrolled pain    Complete by:  As directed      Call MD for:  temperature >100.4    Complete by:  As directed      Call MD for:    Complete by:  As directed   Worsening diarrhea.     Diet - low sodium heart healthy    Complete by:  As directed      Increase activity slowly    Complete by:  As directed             Medication List    STOP taking these medications        carvedilol 12.5 MG tablet  Commonly known as:  COREG     furosemide 40 MG tablet  Commonly known as:  LASIX      TAKE these medications        acetaminophen 325 MG tablet  Commonly known as:  TYLENOL  Take 2 tablets (650 mg total) by mouth every 6 (six) hours as needed for mild pain, moderate pain or fever.     amLODipine 10 MG tablet  Commonly known as:  NORVASC  TAKE ONE TABLET BY MOUTH ONCE DAILY     aspirin 81 MG EC tablet  Take 81 mg by mouth daily.     calcium carbonate 500 MG chewable tablet  Commonly known as:  TUMS - dosed in mg elemental calcium  Chew 2 tablets (400 mg of elemental calcium total) by mouth 3 (three) times daily with meals.     cloNIDine 0.1 MG tablet  Commonly known as:  CATAPRES  Take 1 tablet (0.1 mg total) by mouth 2 (two) times daily.     FERROUSUL 325 (65 FE) MG tablet  Generic drug:  ferrous sulfate  Take 325 mg by mouth daily with breakfast.     gabapentin 100 MG capsule  Commonly known as:  NEURONTIN  Take 1 capsule (100 mg total) by mouth at bedtime.     glimepiride 4 MG tablet  Commonly known as:  AMARYL  Take 1/2 tab by mouth per day     isosorbide mononitrate 30 MG 24 hr tablet  Commonly known as:  IMDUR  Take 1 tablet (30 mg total) by mouth daily.     multivitamin with minerals Tabs tablet  Take 1 tablet by mouth daily.     vancomycin 50 mg/mL oral solution  Commonly known as:  VANCOCIN  Take 2.5 mLs  (125 mg total) by mouth every 6 (six) hours.       Follow-up Information    Follow up with Hinda Lenis, MD. Schedule an appointment as soon as possible for a visit in 4 weeks.   Specialty:  Vascular Surgery   Why:  Office will call you to arrange your appt (sent)   Contact information:   [place] [address] [phone]       Follow up with Ulla Potash., MD.   Specialty:  Nephrology  Contact information:   [place] [address] [phone]       Follow up with [facility].   Why:  Registered nurse, physical therapy and aide services to start within 24-48 hours of discharge   Contact information:   [address] [place] [address] [phone]       Follow up with Cathlean Cower, MD. Schedule an appointment as soon as possible for a visit in 5 days.   Specialties:  Internal Medicine, Radiology   Contact information:   [place] [address] [phone]       Follow up with [facility]..   Why:  Keep up regular dialysis appointments on Mon/Wed/Fri       The results of significant diagnostics from this hospitalization (including imaging, microbiology, ancillary and laboratory) are listed below for reference.    Significant Diagnostic Studies: Dg Chest 2 View  09/11/2014   CLINICAL DATA:  Shortness of breath, pneumonia/HCAP  EXAM: CHEST  2 VIEW  COMPARISON:  09/09/2014 and 09/07/2014  FINDINGS: Right IJ central venous catheter unchanged with tip over the right atrium. Lungs are adequately inflated without focal consolidation or effusion. Cardiomediastinal silhouette is within normal. There is minimal calcified plaque over the aorta. Remainder of the exam is unchanged.  IMPRESSION: No active cardiopulmonary disease.   Electronically Signed   By: [name] Olp M.D.   On: 09/11/2014 08:15   Dg Chest 2 View  09/07/2014   CLINICAL DATA:  Fever.  History of diabetes and hypertension.   EXAM: CHEST  2 VIEW  COMPARISON:  09/03/2014; 09/01/2014 ; 03/15/2014  FINDINGS: Grossly unchanged borderline enlarged cardiac silhouette and mediastinal contours. Stable position of support apparatus. There is persistent mild elevation the medial aspect of right hemidiaphragm. The pulmonary vasculature remains indistinct with cephalization of flow. Minimally improved aeration lungs with persistent bilateral infrahilar heterogeneous opacities, left greater than right. Unchanged small bilateral effusions. No pneumothorax. Unchanged bones.  IMPRESSION: 1. Persistent bilateral infrahilar heterogeneous opacities, left greater than right, possibly atelectasis though worrisome for underlying infection. A follow-up chest radiograph in 4 to 6 weeks after treatment is recommended to ensure resolution. 2. Minimally improved aeration of the lungs with persistent findings of mild pulmonary edema/pulmonary venous congestion.   Electronically Signed   By: Sandi Mariscal M.D.   On: 09/07/2014 17:37   Dg Chest 2 View  09/01/2014   CLINICAL DATA:  Cough, wheezing and shortness of breath. Foreign body projected over the left chest at the time of portable chest x-ray earlier today.  EXAM: CHEST - 2 VIEW  COMPARISON:  Portable film earlier today at 1057 hr  FINDINGS: The radiopaque hair pin is no longer visualized and presumably has been removed. No foreign body is identified in the chest. Lungs show persistent mild interstitial prominence with associated cardiomegaly and small bilateral pleural effusions. Findings are consistent with interstitial edema. Bony structures are unremarkable.  IMPRESSION: No further visualized hair pin overlying the mid left chest. Evidence of mild interstitial edema.   Electronically Signed   By: Aletta Edouard M.D.   On: 09/01/2014 12:56   Dg Chest Port 1 View  09/09/2014   CLINICAL DATA:  Pneumonia.  EXAM: PORTABLE CHEST - 1 VIEW  COMPARISON:  09/07/2014  FINDINGS: Stable cardiac and mediastinal  contours. Right internal jugular central venous catheter is present with tip projecting over the right atrium. Minimal heterogeneous opacities left lung base. Pulmonary vascular redistribution. No pleural  effusion or pneumothorax.  IMPRESSION: Persistent minimal heterogeneous opacities left lung base, potentially secondary to infection. Short-term radiographic follow-up is recommended to ensure resolution.   Electronically Signed   By: Lovey Newcomer M.D.   On: 09/09/2014 13:37   Dg Chest Port 1 View  09/03/2014   CLINICAL DATA:  End-stage renal disease. Status post Diatek catheter placement.  EXAM: PORTABLE CHEST - 1 VIEW  COMPARISON:  09/01/2014  FINDINGS: Right internal jugular Diatek catheter noted with distal tip in the right atrium and proximal tip at the cavoatrial junction. No pneumothorax.  Upper normal heart size. Perihilar interstitial accentuation and faint perihilar airspace opacity. No blunting of the costophrenic angles.  Degenerative glenohumeral arthropathy bilaterally.  IMPRESSION: 1. Right IJ Diatek catheter noted, distal tip in the right atrium. No pneumothorax. 2. Increase in perihilar interstitial and patchy airspace opacity, suspicious for edema.   Electronically Signed   By: Sherryl Barters M.D.   On: 09/03/2014 08:38   Dg Chest Port 1 View  09/01/2014   CLINICAL DATA:  Pt is SOB and wheezing this morning. Blood sugar is low. Hx HTN and diabetes  EXAM: PORTABLE CHEST - 1 VIEW  COMPARISON:  03/15/2014  FINDINGS: New patchy interstitial and airspace opacities bilaterally involving bases more than apices. Heart size upper limits normal for technique. A hair pin projects over the left mainstem bronchus. No effusion. Visualized skeletal structures are unremarkable.  IMPRESSION: 1. New bibasilar interstitial and airspace infiltrates or edema. 2. Hair pin projects over the left mainstem bronchus. Correlate with physical exam to confirm this is external to the patient. Critical Value/emergent  results were called by telephone at the time of interpretation on 09/01/2014 at 11:25 am to Dr. Blanchie Dessert , who verbally acknowledged these results.   Electronically Signed   By: Arne Cleveland M.D.   On: 09/01/2014 11:25    Microbiology: Recent Results (from the past 240 hour(s))  Culture, blood (routine x 2)     Status: None   Collection Time: 09/07/14  3:10 PM  Result Value Ref Range Status   Specimen Description BLOOD RIGHT ARM  Final   Special Requests BOTTLES DRAWN AEROBIC AND ANAEROBIC 5CC  Final   Culture   Final    NO GROWTH 5 DAYS Performed at [facility]    Report Status 09/13/2014 FINAL  Final  Culture, blood (routine x 2)     Status: None   Collection Time: 09/07/14  3:15 PM  Result Value Ref Range Status   Specimen Description BLOOD RIGHT HAND  Final   Special Requests BOTTLES DRAWN AEROBIC ONLY 10CC  Final   Culture   Final    NO GROWTH 5 DAYS Performed at [facility]    Report Status 09/13/2014 FINAL  Final  Culture, Urine     Status: None   Collection Time: 09/11/14  6:26 AM  Result Value Ref Range Status   Specimen Description URINE, CLEAN CATCH  Final   Special Requests Vancomycin, Zosyn  Final   Colony Count   Final    30,000 COLONIES/ML Performed at [place] Performed at [facility]   Final   Report Status 09/12/2014 FINAL  Final     Labs: Basic Metabolic Panel:  Recent Labs Lab 09/09/14 0413 09/10/14 0249 09/11/14 0500 09/12/14 0452 09/13/14 0456  NA 136 133* 133* 132* 135  K 3.3* 4.0 3.7 3.4* 3.7  CL 100 102 102 95* 99  CO2 26  21 26 25 26   GLUCOSE 105* 167* 128* 196* 191*  BUN 7 17 12 18 8   CREATININE 3.34* 4.89* 3.84* 5.02* 3.76*  CALCIUM 7.9* 7.5* 7.3* 7.8* 7.8*  PHOS 3.1 4.8* 3.6 3.4 2.7   Liver Function Tests:  Recent Labs Lab 09/09/14 0413 09/10/14 0249 09/11/14 0500 09/12/14 0452 09/13/14 0456  ALBUMIN 2.1* 2.1* 2.0* 1.9* 2.0*   No results for input(s):  LIPASE, AMYLASE in the last 168 hours. No results for input(s): AMMONIA in the last 168 hours. CBC:  Recent Labs Lab 09/08/14 0352 09/12/14 0700  WBC 11.3* 8.0  HGB 9.5* 11.0*  HCT 31.0* 34.7*  MCV 92.0 91.6  PLT 207 180   Cardiac Enzymes: No results for input(s): CKTOTAL, CKMB, CKMBINDEX, TROPONINI in the last 168 hours. BNP: BNP (last 3 results) No results for input(s): PROBNP in the last 8760 hours. CBG:  Recent Labs Lab 09/12/14 1132 09/12/14 1628 09/12/14 2126 09/13/14 0614 09/13/14 1109  GLUCAP 109* 228* 174* 178* 248*       Signed:  Vernell Leep, MD, FACP, FHM. [facility] Pager [phone]  If 7PM-7AM, please contact night-coverage [email or web address] Password [facility] 09/13/2014, 12:07 PM

## 2014-09-13 NOTE — Progress Notes (Signed)
Patient taken out of hospital via wheelchair for discharge.

## 2014-09-13 NOTE — Progress Notes (Signed)
[facility] ROUNDING NOTE   Subjective:   Interval History: no complaints  Objective:  Vital signs in last 24 hours:  Temp:  [98.3 F (36.8 C)-99.2 F (37.3 C)] 98.5 F (36.9 C) (01/14 0604) Pulse Rate:  [61-75] 61 (01/14 0604) Resp:  [18-20] 18 (01/14 0604) BP: (150-172)/(51-60) 154/51 mmHg (01/14 0604) SpO2:  [95 %-98 %] 98 % (01/14 0604) Weight:  [52.6 kg (115 lb 15.4 oz)] 52.6 kg (115 lb 15.4 oz) (01/14 0604)  Weight change: 0.2 kg (7.1 oz) Filed Weights   09/12/14 0715 09/12/14 1048 09/13/14 0604  Weight: 56 kg (123 lb 7.3 oz) 52.9 kg (116 lb 10 oz) 52.6 kg (115 lb 15.4 oz)    Intake/Output: I/O last 3 completed shifts: In: 680 [P.O.:480; IV Piggyback:200] Out: [id number] [Urine:375; Other:3000]   Intake/Output this shift:  Total I/O In: 240 [P.O.:240] Out: -   CVS- RRR RS- CTA ABD- BS present soft non-distended EXT- no edema   Basic Metabolic Panel:  Recent Labs Lab 09/09/14 0413 09/10/14 0249 09/11/14 0500 09/12/14 0452 09/13/14 0456  NA 136 133* 133* 132* 135  K 3.3* 4.0 3.7 3.4* 3.7  CL 100 102 102 95* 99  CO2 26 21 26 25 26   GLUCOSE 105* 167* 128* 196* 191*  BUN 7 17 12 18 8   CREATININE 3.34* 4.89* 3.84* 5.02* 3.76*  CALCIUM 7.9* 7.5* 7.3* 7.8* 7.8*  PHOS 3.1 4.8* 3.6 3.4 2.7    Liver Function Tests:  Recent Labs Lab 09/09/14 0413 09/10/14 0249 09/11/14 0500 09/12/14 0452 09/13/14 0456  ALBUMIN 2.1* 2.1* 2.0* 1.9* 2.0*   No results for input(s): LIPASE, AMYLASE in the last 168 hours. No results for input(s): AMMONIA in the last 168 hours.  CBC:  Recent Labs Lab 09/08/14 0352 09/12/14 0700  WBC 11.3* 8.0  HGB 9.5* 11.0*  HCT 31.0* 34.7*  MCV 92.0 91.6  PLT 207 180    Cardiac Enzymes: No results for input(s): CKTOTAL, CKMB, CKMBINDEX, TROPONINI in the last 168 hours.  BNP: Invalid input(s): POCBNP  CBG:  Recent Labs Lab 09/12/14 0619 09/12/14 1132 09/12/14 1628 09/12/14 2126 09/13/14 0614  GLUCAP  183* 109* 228* 174* 178*    Microbiology: Results for orders placed or performed during the hospital encounter of 09/01/14  Clostridium Difficile by PCR     Status: Abnormal   Collection Time: 09/02/14  7:59 AM  Result Value Ref Range Status   C difficile by pcr POSITIVE (A) NEGATIVE Final    Comment: CRITICAL RESULT CALLED TO, READ BACK BY AND VERIFIED WITH: E.MILIANO,RN 09/02/14 @1110  BY V.WILKINS   Culture, blood (routine x 2)     Status: None   Collection Time: 09/07/14  3:10 PM  Result Value Ref Range Status   Specimen Description BLOOD RIGHT ARM  Final   Special Requests BOTTLES DRAWN AEROBIC AND ANAEROBIC 5CC  Final   Culture   Final    NO GROWTH 5 DAYS Performed at [facility]    Report Status 09/13/2014 FINAL  Final  Culture, blood (routine x 2)     Status: None   Collection Time: 09/07/14  3:15 PM  Result Value Ref Range Status   Specimen Description BLOOD RIGHT HAND  Final   Special Requests BOTTLES DRAWN AEROBIC ONLY 10CC  Final   Culture   Final    NO GROWTH 5 DAYS Performed at [facility]    Report Status 09/13/2014 FINAL  Final  Culture, Urine     Status:  None   Collection Time: 09/11/14  6:26 AM  Result Value Ref Range Status   Specimen Description URINE, CLEAN CATCH  Final   Special Requests Vancomycin, Zosyn  Final   Colony Count   Final    30,000 COLONIES/ML Performed at [place] Performed at [facility]   Final   Report Status 09/12/2014 FINAL  Final    Coagulation Studies: No results for input(s): LABPROT, INR in the last 72 hours.  Urinalysis: No results for input(s): COLORURINE, LABSPEC, PHURINE, GLUCOSEU, HGBUR, BILIRUBINUR, KETONESUR, PROTEINUR, UROBILINOGEN, NITRITE, LEUKOCYTESUR in the last 72 hours.  Invalid input(s): APPERANCEUR    Imaging: No results found.   Medications:     . amLODipine  10 mg Oral Daily  . aspirin EC  81 mg Oral Daily  . calcium carbonate  400 mg  of elemental calcium Oral TID WC  . cloNIDine  0.1 mg Oral BID  . ferrous sulfate  325 mg Oral Q breakfast  . gabapentin  100 mg Oral QHS  . heparin  5,000 Units Subcutaneous 3 times per day  . isosorbide mononitrate  30 mg Oral Daily  . multivitamin  1 tablet Oral QHS  . sodium chloride  3 mL Intravenous Q12H  . vancomycin  125 mg Oral 4 times per day   acetaminophen **OR** acetaminophen, albuterol, guaiFENesin, hydrALAZINE, menthol-cetylpyridinium, ondansetron **OR** ondansetron (ZOFRAN) IV, traMADol  Assessment/ Plan:  1. End-stage renal disease from progressive chronic kidney disease-presentation with pulmonary edema/shortness of breath and poor response to diuretic therapy. She is set up for outpatient hemodialysis at [facility] on a Monday/Wednesday/Friday schedule. We'll order for hemodialysis again tomorrow to stop She is status post creation of a left brachiocephalic fistula 2. Volume overload/pulmonary edema: Improving with initiation of dialysis-continue efforts at challenging dry weight  3. Anemia: On ESA and received 2 unit packed red cell transfusion yesterday dialysis. 4. Metabolic bone disease: Started on calcium carbonate for phosphorus binding 5. Nutrition: On a renal vitamin, continue protein supplementation as needed.    LOS: 12 Dangela How W @TODAY @10 :50 AM

## 2014-09-13 NOTE — Progress Notes (Signed)
Pt woke up sweating tonight, temperature checked 98.3 oral.

## 2014-09-13 NOTE — Discharge Instructions (Signed)
09/06/2014 Carolyn Valenzuela [id number] March 06, 1951  Surgeon(s): Conrad , MD  Procedure(s): ARTERIOVENOUS (AV) FISTULA CREATION-Left Brachiocephalic  x Do not stick fistula for 12 weeks   Clostridium Difficile Infection Clostridium difficile (C. difficile) is a bacteria found in the intestinal tract or colon. Under certain conditions, it causes diarrhea and sometimes severe disease. The severe form of the disease is known as pseudomembranous colitis (often called C. difficile colitis). This disease can damage the lining of the colon or cause the colon to become enlarged (toxic megacolon). CAUSES Your colon normally contains many different bacteria, including C. difficile. The balance of bacteria in your colon can change during illness. This is especially true when you take antibiotic medicine. Taking antibiotics may allow the C. difficile to grow, multiply excessively, and make a toxin that then causes illness. The elderly and people with certain medical conditions have a greater risk of getting C. difficile infections. SYMPTOMS  Watery diarrhea.  Fever.  Fatigue.  Loss of appetite.  Nausea.  Abdominal swelling, pain, or tenderness.  Dehydration. DIAGNOSIS Your symptoms may make your caregiver suspect a C. difficile infection, especially if you have used antibiotics in the preceding weeks. However, there are only 2 ways to know for certain whether you have a C. difficile infection:  A lab test that finds the toxin in your stool.  The specific appearance of an abnormality (pseudomembrane) in your colon. This can only be seen by doing a sigmoidoscopy or colonoscopy. These procedures involve passing an instrument through your rectum to look at the inside of your colon. Your caregiver will help determine if these tests are necessary. TREATMENT  Most people are successfully treated with one of two specific antibiotics, usually given by mouth. Other antibiotics you are  receiving are stopped if possible.  Intravenous (IV) fluids and correction of electrolyte imbalance may be necessary.  Rarely, surgery may be needed to remove the infected part of the intestines.  Careful hand washing by you and your caregivers is important to prevent the spread of infection. In the hospital, your caregivers may also put on gowns and gloves to prevent the spread of the C. difficile bacteria. Your room is also cleaned regularly with a solution containing bleach or a product that is known to kill C. difficile. HOME CARE INSTRUCTIONS  Drink enough fluids to keep your urine clear or pale yellow. Avoid milk, caffeine, and alcohol.  Ask your caregiver for specific rehydration instructions.  Try eating small, frequent meals rather than large meals.  Take your antibiotics as directed. Finish them even if you start to feel better.  Do not use medicines to slow diarrhea. This could delay healing or cause complications.  Wash your hands thoroughly after using the bathroom and before preparing food.  Make sure people who live with you wash their hands often, too.  Carefully disinfect all surfaces with a product that contains chlorine bleach. SEEK MEDICAL CARE IF:  Diarrhea persists longer than expected or recurs after completing your course of antibiotic treatment for the C. difficile infection.  You have trouble staying hydrated. SEEK IMMEDIATE MEDICAL CARE IF:  You develop a new fever.  You have increasing abdominal pain or tenderness.  There is blood in your stools, or your stools are dark black and tarry.  You cannot hold down food or liquids. MAKE SURE YOU:  Understand these instructions.  Will watch your condition.  Will get help right away if you are not doing well or get worse. Document Released:  05/27/2005 Document Revised: 01/01/2014 Document Reviewed: 01/23/2011 [facility] Patient Information 2015 [place], [place]. This information is not intended to replace  advice given to you by your health care provider. Make sure you discuss any questions you have with your health care provider.

## 2014-09-14 LAB — HIV ANTIBODY (ROUTINE TESTING W REFLEX)
HIV 1/O/2 Abs-Index Value: <1 (ref <1.00)
HIV-1/HIV-2 Ab: NONREACTIVE

## 2014-09-18 ENCOUNTER — Encounter: Payer: Self-pay | Admitting: Physical Therapy

## 2014-09-18 ENCOUNTER — Ambulatory Visit: Payer: BC Managed Care – PPO | Admitting: Physical Therapy

## 2014-09-18 ENCOUNTER — Ambulatory Visit: Payer: BC Managed Care – PPO | Admitting: Internal Medicine

## 2014-09-18 ENCOUNTER — Telehealth: Payer: Self-pay | Admitting: Internal Medicine

## 2014-09-18 NOTE — Telephone Encounter (Signed)
Patient no showed for fu.  Please advise.

## 2014-09-18 NOTE — Therapy (Unsigned)
[facility] [address] [place], [place], [address] Phone: [phone]   Fax:  [phone]  Patient Details  Name: Carolyn Valenzuela MRN: [id number] Date of Birth: 1950/09/13 Referring Provider:  No ref. provider found  Encounter Date: 09/18/2014  PHYSICAL THERAPY DISCHARGE SUMMARY  Visits from Start of Care: 16  Current functional level related to goals / functional outcomes: This patient was making progress towards all LTGs but not met any due to pain at distal tibia and discharged early due to hospitalization.  PT Long Term Goals - 08/22/14 1420    PT LONG TERM GOAL #1   Title Demonstrate and verbalize proper prosthetic care to enable safe utilization of the prosthesis. (PT reset Target Date to 09/21/14 at renewal)   Baseline 08/22/2014- pt still needs cues on sock management at times.   Time 4   Period Weeks   Status On-going   PT LONG TERM GOAL #2   Title Tolerate wear of prosthesis for >90% of awake hours without change in skin integrity nor undue tenderness. (PT reset Target Date to 09/21/14 at renewal)   Baseline 08/22/2014- Pt still getting soreness/pain with at end of limb, no skin issues.   Time 4   Period Weeks   Status On-going   PT LONG TERM GOAL #3   Title Ambulate 500 feet with LRAD and prosthesis modified independent. (PT reset Target Date to 09/21/14 at renewal)   Baseline 08/22/2014- met with RW/prosthesis, not with cane.   Time 4   Period Weeks   Status On-going   PT LONG TERM GOAL #4   Title Ambulate 75 feet around household furniture carring cup with single point cane and prosthesis modified independent. (PT reset Target Date to 09/21/14 at renewal)   Baseline 08/22/2014- needs min guard assist, however can ambulate up to 100 feet around furniture with cup of water using single point cane.   Time 4   Period Weeks   Status On-going    PT LONG TERM GOAL #5   Title Negotiate ramp, curb, stairs with LRAD and prosthesis modified independent. (PT reset Target Date to 09/21/14 at renewal)   Baseline 08/22/2014- met using RW/prosthesis and 2 rails on stairs.   Time 4   Period Weeks   Status Achieved   PT LONG TERM GOAL #6   Title Perform standing activities for >20 minutes with prosthesis with no pain. (PT reset Target Date to 09/21/14 at renewal)   Baseline 08/22/2014- reports increased distal tibia pain with increased activity/weight bearing with prosthesis.   Status On-going   PT LONG TERM GOAL #7   Title Berg Balance Test score with prosthesis >/= 45/56.    Baseline 08/22/2014- met with score of 49/56.   Status Achieved   PT LONG TERM GOAL #8   Title Self-report via FOTO improvement >10 points in Functional status. 08/23/2014   Baseline 08/22/2014- not assessed as pt is being renewed by PT   Status On-going   PT LONG TERM GOAL #9   TITLE Demonstrate work simulated tasks wiht prosthesis safely. (PT reset Target Date to 09/21/14 at renewal)   Baseline 08/22/2014- not addressed as yet.   Status On-going      Remaining deficits: Unknown due to hospitalization.   Education / Equipment: Patient was instructed in prosthetic care and verbalized /demonstrated basic understanding. Plan: Patient agrees to discharge.  Patient goals were not met. Patient is being discharged due to a change in medical status.  ?????  Jamey Reas, PT, DPT PT Specializing in [place] 09/18/14 8:32 AM Phone:  [phone]  Fax:  [phone] [place] [address] [place], [place] [address]  Jamey Reas 09/18/2014, 8:30 AM  [facility] [address] [place], [place], [address] Phone: [phone]   Fax:  [phone]

## 2014-09-19 NOTE — Telephone Encounter (Signed)
Ok to offer f/u appt at her convenience

## 2014-09-20 ENCOUNTER — Encounter: Payer: BC Managed Care – PPO | Admitting: Physical Therapy

## 2014-09-20 NOTE — Telephone Encounter (Signed)
Got patient scheduled for the 28th.

## 2014-09-27 ENCOUNTER — Ambulatory Visit (INDEPENDENT_AMBULATORY_CARE_PROVIDER_SITE_OTHER): Payer: BC Managed Care – PPO | Admitting: Internal Medicine

## 2014-09-27 DIAGNOSIS — E118 Type 2 diabetes mellitus with unspecified complications: Secondary | ICD-10-CM

## 2014-09-30 NOTE — Assessment & Plan Note (Signed)
Pt not seen, note opened in error, no charge

## 2014-09-30 NOTE — Progress Notes (Signed)
   Subjective:    Patient ID: Carolyn Valenzuela, female    DOB: [birth year], 64 y.o.   MRN: [id number]  HPI this note opened in error; pt not seen    Review of Systems     Objective:   Physical Exam        Assessment & Plan:

## 2014-09-30 NOTE — Patient Instructions (Signed)
none

## 2014-10-05 ENCOUNTER — Encounter: Payer: BC Managed Care – PPO | Admitting: Vascular Surgery

## 2014-10-05 ENCOUNTER — Telehealth: Payer: Self-pay | Admitting: Internal Medicine

## 2014-10-05 NOTE — Telephone Encounter (Signed)
Pt request test strips of one touch ultra to be send to [facility]. Pt also request phone call from the assistant concern about name of the medication they gave her at dialysis (do not remember the name of this med). Please call her

## 2014-10-08 MED ORDER — GLUCOSE BLOOD VI STRP: 1.0000 | ORAL_STRIP | Freq: Two times a day (BID) | Status: DC

## 2014-10-08 NOTE — Telephone Encounter (Signed)
Called pt no answer x's 10 rings rx for testing strips sent to [facility]...Johny Chess

## 2014-10-10 ENCOUNTER — Telehealth: Payer: Self-pay | Admitting: Internal Medicine

## 2014-10-10 NOTE — Telephone Encounter (Signed)
Pt called wanted to see if Dr Jenny Reichmann could call in her test strip for her one touch meter

## 2014-10-10 NOTE — Telephone Encounter (Signed)
Ok to Carolyn Valenzuela for routine refill, one strip per day

## 2014-10-11 ENCOUNTER — Telehealth: Payer: Self-pay | Admitting: Internal Medicine

## 2014-10-11 MED ORDER — ONETOUCH VERIO W/DEVICE KIT: 1.0000 | PACK | Freq: Two times a day (BID) | Status: DC

## 2014-10-11 MED ORDER — GLUCOSE BLOOD VI STRP: 1.0000 | ORAL_STRIP | Freq: Two times a day (BID) | Status: DC

## 2014-10-11 NOTE — Telephone Encounter (Signed)
rx's sent in electronically to [facility], pt aware

## 2014-10-11 NOTE — Telephone Encounter (Signed)
Would like to ask for extension on home PT b/c patient has missed a few visits due to sickness.

## 2014-10-11 NOTE — Telephone Encounter (Signed)
Ok for verbal 

## 2014-10-11 NOTE — Telephone Encounter (Signed)
Verbal order given to Merry Proud at [facility]

## 2014-10-16 ENCOUNTER — Other Ambulatory Visit: Payer: Self-pay | Admitting: *Deleted

## 2014-10-16 MED ORDER — ISOSORBIDE MONONITRATE ER 30 MG PO TB24: 30.0000 mg | ORAL_TABLET | Freq: Every day | ORAL | Status: DC

## 2014-10-17 DIAGNOSIS — E46 Unspecified protein-calorie malnutrition: Secondary | ICD-10-CM | POA: Insufficient documentation

## 2014-10-18 ENCOUNTER — Ambulatory Visit (INDEPENDENT_AMBULATORY_CARE_PROVIDER_SITE_OTHER): Payer: BC Managed Care – PPO | Admitting: Internal Medicine

## 2014-10-18 ENCOUNTER — Encounter: Payer: Self-pay | Admitting: Vascular Surgery

## 2014-10-18 ENCOUNTER — Encounter: Payer: Self-pay | Admitting: Internal Medicine

## 2014-10-18 VITALS — BP 148/68 | HR 68 | Temp 98.5°F | Wt 114.0 lb

## 2014-10-18 DIAGNOSIS — R21 Rash and other nonspecific skin eruption: Secondary | ICD-10-CM

## 2014-10-18 DIAGNOSIS — I10 Essential (primary) hypertension: Secondary | ICD-10-CM

## 2014-10-18 DIAGNOSIS — E118 Type 2 diabetes mellitus with unspecified complications: Secondary | ICD-10-CM

## 2014-10-18 MED ORDER — CLOTRIMAZOLE-BETAMETHASONE 1-0.05 % EX CREA: TOPICAL_CREAM | CUTANEOUS | Status: DC

## 2014-10-18 NOTE — Assessment & Plan Note (Signed)
D/w pt,  Lab Results  Component Value Date   HGBA1C 8.0* 09/02/2014   Most recent a1c improved, but should ideally be at least < 7.5 or < 7,  But pt "has been through a lot" and does not want any med changes today, declines referral to endocrinology

## 2014-10-18 NOTE — Patient Instructions (Addendum)
Please take all new medication as prescribed - the cream for the rash  Please continue all other medications as before, and refills have been done if requested.  Please have the pharmacy call with any other refills you may need.  Please keep your appointments with your specialists as you may have planned  Please return in 6 months, or sooner if needed  Please let us know the Last Day that you worked in 2015 so that we can finish the [place] form

## 2014-10-18 NOTE — Progress Notes (Signed)
Subjective:    Patient ID: [name], female    DOB: [birth year], 64 y.o.   MRN: [id number]  HPI  Here to f/u; overall doing ok,  Pt denies chest pain, increased sob or doe, wheezing, orthopnea, PND, increased LE swelling, palpitations, dizziness or syncope.  Pt denies polydipsia, polyuria, or low sugar symptoms such as weakness or confusion improved with po intake.  Pt denies new neurological symptoms such as new headache, or facial or extremity weakness or numbness.   Pt states overall good compliance with meds, has been trying to follow lower cholesterol, diabetic diet, with wt overall stable,  but little exercise however.  Has new rash to dorsal left hand with itch, mild for 1 wk, no pain, weepiness or drainage, seems sort of dry and bumpy, started as a small dark/reddish area, now increased to most of the back of the hand, itches. Past Medical History  Diagnosis Date  . DIABETES MELLITUS, UNCONTROLLED 05/21/2009  . HYPERLIPIDEMIA 03/30/2007  . ANEMIA-IRON DEFICIENCY 01/25/2008  . HYPERTENSION 01/25/2008  . SINUSITIS- ACUTE-NOS 01/25/2008  . SECONDARY HYPERPARATHYROIDISM 05/21/2009  . RENAL INSUFFICIENCY 02/01/2008  . CERVICAL RADICULOPATHY, LEFT 01/25/2008  . BACK PAIN 05/02/2009  . NUMBNESS 05/21/2009  . Allergic rhinitis, cause unspecified 12/08/2010  . Foot ulcer     right lateral malleolus  . Critical lower limb ischemia    Past Surgical History  Procedure Laterality Date  . Av fistula placement Left   . Eye surgery Bilateral     cataracts removed, left eye still has some oil in it.  . Colonoscopy    . Amputation Right 03/15/2014    Procedure: RIGHT  LEG  BELOW KNEE AMPUTATION ;  Surgeon: [name], MD;  Location: [facility];  Service: Orthopedics;  Laterality: Right;  . Insertion of dialysis catheter N/A 09/03/2014    Procedure: INSERTION OF DIALYSIS CATHETER RIGHT INTERNAL JUGULAR VEIN;  Surgeon: Conrad Buffalo, MD;  Location: [facility];  Service: Vascular;  Laterality: N/A;  . Av fistula  placement Left 09/06/2014    Procedure: ARTERIOVENOUS (AV) FISTULA CREATION-Left Brachiocephalic;  Surgeon: Conrad Kiowa, MD;  Location: [facility];  Service: Vascular;  Laterality: Left;    reports that she has never smoked. She has never used smokeless tobacco. She reports that she does not drink alcohol or use illicit drugs. family history includes Cancer in her mother. Allergies  Allergen Reactions  . Oxycodone Itching   Current Outpatient Prescriptions on File Prior to Visit  Medication Sig Dispense Refill  . acetaminophen (TYLENOL) 325 MG tablet Take 2 tablets (650 mg total) by mouth every 6 (six) hours as needed for mild pain, moderate pain or fever.    [name] amLODipine (NORVASC) 10 MG tablet TAKE ONE TABLET BY MOUTH ONCE DAILY 30 tablet 6  . aspirin 81 MG EC tablet Take 81 mg by mouth daily.      . Blood Glucose Monitoring Suppl (ONETOUCH VERIO) W/DEVICE KIT 1 each by Does not apply route 2 (two) times daily. 1 kit 0  . calcium carbonate (TUMS - DOSED IN MG ELEMENTAL CALCIUM) 500 MG chewable tablet Chew 2 tablets (400 mg of elemental calcium total) by mouth 3 (three) times daily with meals. 60 tablet 1  . cloNIDine (CATAPRES) 0.1 MG tablet Take 1 tablet (0.1 mg total) by mouth 2 (two) times daily. 60 tablet 0  . ferrous sulfate (FERROUSUL) 325 (65 FE) MG tablet Take 325 mg by mouth daily with breakfast.    . gabapentin (  NEURONTIN) 100 MG capsule Take 1 capsule (100 mg total) by mouth at bedtime. 30 capsule 0  . glimepiride (AMARYL) 4 MG tablet Take 1/2 tab by mouth per day (Patient taking differently: Take 2 mg by mouth daily with breakfast. ) 15 tablet 11  . glucose blood (ONETOUCH VERIO) test strip 1 each by Other route 2 (two) times daily. Use as instructed 100 each 11  . isosorbide mononitrate (IMDUR) 30 MG 24 hr tablet Take 1 tablet (30 mg total) by mouth daily. 30 tablet 0  . Multiple Vitamin (MULTIVITAMIN WITH MINERALS) TABS tablet Take 1 tablet by mouth daily.    . vancomycin (VANCOCIN)  50 mg/mL oral solution Take 2.5 mLs (125 mg total) by mouth every 6 (six) hours. 110 mL 0   No current facility-administered medications on file prior to visit.   Review of Systems  Constitutional: Negative for unusual diaphoresis or other sweats  HENT: Negative for ringing in ear Eyes: Negative for double vision or worsening visual disturbance.  Respiratory: Negative for choking and stridor.   Gastrointestinal: Negative for vomiting or other signifcant bowel change Genitourinary: Negative for hematuria or decreased urine volume.  Musculoskeletal: Negative for other MSK pain or swelling Skin: Negative for color change and worsening wound.  Neurological: Negative for tremors and numbness other than noted  Psychiatric/Behavioral: Negative for decreased concentration or agitation other than above       Objective:   Physical Exam BP 148/68 mmHg  Pulse 68  Temp(Src) 98.5 F (36.9 C) (Oral)  Wt 114 lb (51.71 kg) VS noted, not ill appearing Constitutional: Pt appears well-developed, well-nourished.  HENT: Head: NCAT.  Right Ear: External ear normal.  Left Ear: External ear normal.  Eyes: . Pupils are equal, round, and reactive to light. Conjunctivae and EOM are normal Neck: Normal range of motion. Neck supple.  Cardiovascular: Normal rate and regular rhythm.   Pulmonary/Chest: Effort normal and breath sounds without rales or wheezing.  Neurological: Pt is alert. Not confused , motor grossly intact Skin: Skin is warm. nontender erythem/dark bumpy rash to post left hand, s/p right BKA Psychiatric: Pt behavior is normal. No agitation.     Assessment & Plan:

## 2014-10-18 NOTE — Progress Notes (Signed)
Pre visit review using our clinic review tool, if applicable. No additional management support is needed unless otherwise documented below in the visit note. 

## 2014-10-18 NOTE — Assessment & Plan Note (Signed)
stable overall by history and exam, recent data reviewed with pt, and pt to continue medical treatment as before,  to f/u any worsening symptoms or concerns BP Readings from Last 3 Encounters:  10/18/14 148/68  09/13/14 176/60  08/27/14 205/78

## 2014-10-18 NOTE — Assessment & Plan Note (Signed)
C/w mild dermatitis - for lotrisone asd,  to f/u any worsening symptoms or concerns

## 2014-10-19 ENCOUNTER — Ambulatory Visit (HOSPITAL_COMMUNITY)
Admission: RE | Admit: 2014-10-19 | Discharge: 2014-10-19 | Disposition: A | Discharge status: Home / Self Care | Payer: BC Managed Care – PPO | Source: Ambulatory Visit | Attending: Vascular Surgery | Admitting: Vascular Surgery

## 2014-10-19 ENCOUNTER — Encounter: Payer: Self-pay | Admitting: Vascular Surgery

## 2014-10-19 ENCOUNTER — Ambulatory Visit (INDEPENDENT_AMBULATORY_CARE_PROVIDER_SITE_OTHER): Payer: Self-pay | Admitting: Vascular Surgery

## 2014-10-19 ENCOUNTER — Encounter: Payer: BC Managed Care – PPO | Admitting: Vascular Surgery

## 2014-10-19 ENCOUNTER — Encounter (HOSPITAL_COMMUNITY): Payer: BC Managed Care – PPO

## 2014-10-19 VITALS — BP 172/76 | HR 79 | Ht 60.0 in | Wt 114.0 lb

## 2014-10-19 DIAGNOSIS — Z992 Dependence on renal dialysis: Principal | ICD-10-CM

## 2014-10-19 DIAGNOSIS — M7989 Other specified soft tissue disorders: Secondary | ICD-10-CM

## 2014-10-19 DIAGNOSIS — N186 End stage renal disease: Secondary | ICD-10-CM

## 2014-10-19 NOTE — Progress Notes (Signed)
    Postoperative Access Visit   History of Present Illness  Carolyn Valenzuela is a 64 y.o. year old female who presents for postoperative follow-up for: L BC AVF (Date: 09/06/14).  The patient's wounds are healed.  The patient notes no steal symptoms.  The patient is able to complete their activities of daily living.  The patient's current symptoms are: none.  For VQI Use Only  PRE-ADM LIVING: Home  AMB STATUS: Ambulatory  Physical Examination Filed Vitals:   10/19/14 1007  BP: 172/76  Pulse: 79    LUE: Incision is healed, skin feels warm, hand grip is 5/5, sensation in digits is intact, palpable thrill, bruit can be auscultated , Fistula is visible: >6 mm thoughout most on Sonosite  LLE Venous reflux duplex (10/19/2014)  +SSV chronic thrombus  +CFV reflux  Medical Decision Making  Carolyn Valenzuela is a 64 y.o. year old female who presents s/p L BC AVF, CVI (C2)  The patient's access is ready for use.  The patient's tunneled dialysis catheter can be removed after two successful cannulations and completed dialysis treatments.  In regards to the SSV thrombus, anticoagulation is not needed.   Pt has CVI in left leg, so compressive therapy can be used as needed.  Thank you for allowing [place] to participate in this patient's care.  Adele Barthel, MD [facility] Office: [phone] Pager: [phone]  10/19/2014, 10:21 AM

## 2014-11-01 ENCOUNTER — Telehealth: Payer: Self-pay | Admitting: Internal Medicine

## 2014-11-01 NOTE — Telephone Encounter (Signed)
APPT SET

## 2014-11-01 NOTE — Telephone Encounter (Signed)
Tried to contact patient to notify.  Phone would ring with no answer.

## 2014-11-01 NOTE — Telephone Encounter (Signed)
Pt called asking to see if Dr. Jenny Reichmann willing to let her take the whole tab for glimepiride (AMARYL) 4 MG. Please call pt

## 2014-11-01 NOTE — Telephone Encounter (Signed)
Please make ROV

## 2014-11-06 ENCOUNTER — Ambulatory Visit (INDEPENDENT_AMBULATORY_CARE_PROVIDER_SITE_OTHER): Payer: BC Managed Care – PPO | Admitting: Internal Medicine

## 2014-11-06 ENCOUNTER — Encounter: Payer: Self-pay | Admitting: Internal Medicine

## 2014-11-06 VITALS — BP 138/78 | HR 87 | Temp 98.6°F | Resp 18 | Ht 60.0 in | Wt 112.0 lb

## 2014-11-06 DIAGNOSIS — E1122 Type 2 diabetes mellitus with diabetic chronic kidney disease: Secondary | ICD-10-CM | POA: Diagnosis not present

## 2014-11-06 DIAGNOSIS — I10 Essential (primary) hypertension: Secondary | ICD-10-CM | POA: Diagnosis not present

## 2014-11-06 DIAGNOSIS — N186 End stage renal disease: Secondary | ICD-10-CM | POA: Diagnosis not present

## 2014-11-06 DIAGNOSIS — E785 Hyperlipidemia, unspecified: Secondary | ICD-10-CM | POA: Diagnosis not present

## 2014-11-06 MED ORDER — GLIPIZIDE ER 5 MG PO TB24: 5.0000 mg | ORAL_TABLET | Freq: Every day | ORAL | Status: DC

## 2014-11-06 NOTE — Progress Notes (Signed)
Pre visit review using our clinic review tool, if applicable. No additional management support is needed unless otherwise documented below in the visit note. 

## 2014-11-06 NOTE — Progress Notes (Signed)
Subjective:    Patient ID: Carolyn Valenzuela, female    DOB: [birth year], 64 y.o.   MRN: [id number]  HPI  Here to f/u; overall doing ok,  Pt denies chest pain, increasing sob or doe, wheezing, orthopnea, PND, increased LE swelling, palpitations, dizziness or syncope.  Pt denies new neurological symptoms such as new headache, or facial or extremity weakness or numbness.  Pt denies polydipsia, polyuria, or low sugar episode.   Pt denies new neurological symptoms such as new headache, or facial or extremity weakness or numbness.   Pt states overall good compliance with meds, mostly trying to follow appropriate diet, with wt overall stable,  but little exercise however. Cont's on HD - M_W_F, and typically does not eat during 1130 to 4 pm those days.  Has several blood sugars in 300's recently, but also as low as 85 recently in a PM (non dialysis day), Appetie not good but typical for start HD it seems, trying to force herself to eat more.   Wt Readings from Last 3 Encounters:  11/06/14 112 lb (50.803 kg)  10/19/14 114 lb (51.71 kg)  10/18/14 114 lb (51.71 kg)  Overall lost 2 lbs since last visit, she is not sure of her dry wt goal.  Asking for increased glimparide Past Medical History  Diagnosis Date  . DIABETES MELLITUS, UNCONTROLLED 05/21/2009  . HYPERLIPIDEMIA 03/30/2007  . ANEMIA-IRON DEFICIENCY 01/25/2008  . HYPERTENSION 01/25/2008  . SINUSITIS- ACUTE-NOS 01/25/2008  . SECONDARY HYPERPARATHYROIDISM 05/21/2009  . RENAL INSUFFICIENCY 02/01/2008  . CERVICAL RADICULOPATHY, LEFT 01/25/2008  . BACK PAIN 05/02/2009  . NUMBNESS 05/21/2009  . Allergic rhinitis, cause unspecified 12/08/2010  . Foot ulcer     right lateral malleolus  . Critical lower limb ischemia    Past Surgical History  Procedure Laterality Date  . Av fistula placement Left   . Eye surgery Bilateral     cataracts removed, left eye still has some oil in it.  . Colonoscopy    . Amputation Right 03/15/2014    Procedure: RIGHT  LEG  BELOW  KNEE AMPUTATION ;  Surgeon: [name], MD;  Location: [facility] [place];  Service: Orthopedics;  Laterality: Right;  . Insertion of dialysis catheter N/A 09/03/2014    Procedure: INSERTION OF DIALYSIS CATHETER RIGHT INTERNAL JUGULAR VEIN;  Surgeon: Conrad Perry, MD;  Location: [facility];  Service: Vascular;  Laterality: N/A;  . Av fistula placement Left 09/06/2014    Procedure: ARTERIOVENOUS (AV) FISTULA CREATION-Left Brachiocephalic;  Surgeon: Conrad St. Vincent, MD;  Location: [facility];  Service: Vascular;  Laterality: Left;    reports that she has never smoked. She has never used smokeless tobacco. She reports that she does not drink alcohol or use illicit drugs. family history includes Cancer in her mother. Allergies  Allergen Reactions  . Oxycodone Itching   Review of Systems  Constitutional: Negative for unusual diaphoresis or night sweats HENT: Negative for ringing in ear or discharge Eyes: Negative for double vision or worsening visual disturbance.  Respiratory: Negative for choking and stridor.   Gastrointestinal: Negative for vomiting or other signifcant bowel change Genitourinary: Negative for hematuria or change in urine volume.  Musculoskeletal: Negative for other MSK pain or swelling Skin: Negative for color change and worsening wound.  Neurological: Negative for tremors and numbness other than noted  Psychiatric/Behavioral: Negative for decreased concentration or agitation other than above       Objective:   Physical Exam BP 138/78 mmHg  Pulse 87  Temp(Src) 98.6  F (37 C) (Oral)  Resp 18  Ht 5' (1.524 m)  Wt 112 lb (50.803 kg)  BMI 21.87 kg/m2  SpO2 97% VS noted,  Constitutional: Pt appears in no significant distress HENT: Head: NCAT.  Right Ear: External ear normal.  Left Ear: External ear normal.  Eyes: . Pupils are equal, round, and reactive to light. Conjunctivae and EOM are normal Neck: Normal range of motion. Neck supple.  Cardiovascular: Normal rate and regular rhythm.     Pulmonary/Chest: Effort normal and breath sounds without rales or wheezing.  Abd:  Soft, NT, ND, + BS Neurological: Pt is alert. Not confused , motor grossly intact Skin: Skin is warm. No rash, no LE edema Psychiatric: Pt behavior is normal. No agitation.  S/p RLE BKA    Assessment & Plan:

## 2014-11-06 NOTE — Assessment & Plan Note (Signed)
Likely needs better control overall but less med all in first of day - OK for change glimeparide 2 qam to glipizide ER 5 qd, cont to monitor cbg's, call for cbg > 110 or any low sugar symptoms Lab Results  Component Value Date   HGBA1C 8.0* 09/02/2014

## 2014-11-06 NOTE — Patient Instructions (Signed)
OK to stop the glimeparide  Please take all new medication as prescribed - the glipizide ER 5 mg per day  Please continue all other medications as before, and refills have been done if requested.  Please have the pharmacy call with any other refills you may need.  Please keep your appointments with your specialists as you may have planned  Your form is filled out today

## 2014-11-06 NOTE — Assessment & Plan Note (Signed)
Severe elev recently, now on zocor 20, stable overall by history and exam, recent data reviewed with pt, and pt to continue medical treatment as before,  to f/u any worsening symptoms or concerns, declines labs today, f/u labs next viist Lab Results  Component Value Date   LDLCALC 233* 07/06/2014

## 2014-11-06 NOTE — Assessment & Plan Note (Signed)
stable overall by history and exam, recent data reviewed with pt, and pt to continue medical treatment as before,  to f/u any worsening symptoms or concerns BP Readings from Last 3 Encounters:  11/06/14 138/78  10/19/14 172/76  10/18/14 148/68

## 2014-11-26 ENCOUNTER — Encounter: Payer: Self-pay | Admitting: Physical Medicine & Rehabilitation

## 2014-11-26 ENCOUNTER — Encounter
Payer: BC Managed Care – PPO | Attending: Physical Medicine & Rehabilitation | Admitting: Physical Medicine & Rehabilitation

## 2014-11-26 NOTE — Progress Notes (Signed)
° °  Subjective:    Patient ID: Carolyn Valenzuela, female    DOB: [birth year], 64 y.o.   MRN: [id number]  HPI    Review of Systems     Objective:   Physical Exam        Assessment & Plan:

## 2014-11-26 NOTE — Progress Notes (Deleted)
Subjective:    Patient ID: Carolyn Valenzuela Comment, female    DOB: 03/15/1951, 64 y.o.   MRN: [id number]  HPI   Pain Inventory Average Pain 8 Pain Right Now 7 My pain is aching  In the last 24 hours, has pain interfered with the following? General activity 6 Relation with others 9 Enjoyment of life 9 What TIME of day is your pain at its worst? morning, daytime, evening Sleep (in general) Poor  Pain is worse with: some activites Pain improves with: rest, heat/ice and medication Relief from Meds: 8  Mobility walk without assistance how many minutes can you walk? 10 ability to climb steps?  yes transfers alone  Function disabled: date disabled .  Neuro/Psych depression  Prior Studies Any changes since last visit?  no  Physicians involved in your care Any changes since last visit?  no   Family History  Problem Relation Age of Onset  . Cancer Mother     Pancreatic   History   Social History  . Marital Status: Single    Spouse Name: N/A  . Number of Children: N/A  . Years of Education: N/A   Occupational History  . [place] History Main Topics  . Smoking status: Never Smoker   . Smokeless tobacco: Never Used  . Alcohol Use: No  . Drug Use: No  . Sexual Activity: Not on file   Other Topics Concern  . None   Social History Narrative   Past Surgical History  Procedure Laterality Date  . Av fistula placement Left   . Eye surgery Bilateral     cataracts removed, left eye still has some oil in it.  . Colonoscopy    . Amputation Right 03/15/2014    Procedure: RIGHT  LEG  BELOW KNEE AMPUTATION ;  Surgeon: [name], MD;  Location: [facility];  Service: Orthopedics;  Laterality: Right;  . Insertion of dialysis catheter N/A 09/03/2014    Procedure: INSERTION OF DIALYSIS CATHETER RIGHT INTERNAL JUGULAR VEIN;  Surgeon: Conrad Mascotte, MD;  Location: [facility];  Service: Vascular;  Laterality: N/A;  . Av fistula placement Left 09/06/2014   Procedure: ARTERIOVENOUS (AV) FISTULA CREATION-Left Brachiocephalic;  Surgeon: Conrad [place], MD;  Location: [facility];  Service: Vascular;  Laterality: Left;   Past Medical History  Diagnosis Date  . DIABETES MELLITUS, UNCONTROLLED 05/21/2009  . HYPERLIPIDEMIA 03/30/2007  . ANEMIA-IRON DEFICIENCY 01/25/2008  . HYPERTENSION 01/25/2008  . SINUSITIS- ACUTE-NOS 01/25/2008  . SECONDARY HYPERPARATHYROIDISM 05/21/2009  . RENAL INSUFFICIENCY 02/01/2008  . CERVICAL RADICULOPATHY, LEFT 01/25/2008  . BACK PAIN 05/02/2009  . NUMBNESS 05/21/2009  . Allergic rhinitis, cause unspecified 12/08/2010  . Foot ulcer     right lateral malleolus  . Critical lower limb ischemia    BP 147/88 mmHg  Pulse 58  Resp 14  SpO2 97%  Opioid Risk Score:   Fall Risk Score:  `1  Depression screen PHQ 2/9  Depression screen [facility] 2/9 11/26/2014 09/06/2013  Decreased Interest 0 0  Down, Depressed, Hopeless 0 0  PHQ - 2 Score 0 0  Altered sleeping 1 -  Tired, decreased energy 2 -  Change in appetite 0 -  Feeling bad or failure about yourself  0 -  Trouble concentrating 0 -  Moving slowly or fidgety/restless 0 -  Suicidal thoughts 0 -  PHQ-9 Score 3 -     Review of Systems  Endocrine:       High blood sugar  Psychiatric/Behavioral: Positive for dysphoric mood.  All other systems reviewed and are negative.      Objective:   Physical Exam        Assessment & Plan:

## 2014-12-13 DIAGNOSIS — L299 Pruritus, unspecified: Secondary | ICD-10-CM | POA: Insufficient documentation

## 2014-12-13 DIAGNOSIS — R0602 Shortness of breath: Secondary | ICD-10-CM | POA: Insufficient documentation

## 2014-12-13 DIAGNOSIS — R52 Pain, unspecified: Secondary | ICD-10-CM | POA: Insufficient documentation

## 2014-12-13 DIAGNOSIS — R197 Diarrhea, unspecified: Secondary | ICD-10-CM | POA: Insufficient documentation

## 2014-12-26 ENCOUNTER — Telehealth (HOSPITAL_COMMUNITY): Payer: Self-pay | Admitting: *Deleted

## 2015-01-04 ENCOUNTER — Ambulatory Visit: Payer: BC Managed Care – PPO | Admitting: Internal Medicine

## 2015-01-21 ENCOUNTER — Telehealth (HOSPITAL_COMMUNITY): Payer: Self-pay | Admitting: *Deleted

## 2015-02-07 ENCOUNTER — Encounter: Payer: Self-pay | Admitting: Internal Medicine

## 2015-02-07 ENCOUNTER — Ambulatory Visit (INDEPENDENT_AMBULATORY_CARE_PROVIDER_SITE_OTHER): Payer: BC Managed Care – PPO | Admitting: Internal Medicine

## 2015-02-07 VITALS — BP 158/62 | HR 81 | Temp 98.8°F | Wt 104.0 lb

## 2015-02-07 DIAGNOSIS — I10 Essential (primary) hypertension: Secondary | ICD-10-CM

## 2015-02-07 DIAGNOSIS — R21 Rash and other nonspecific skin eruption: Secondary | ICD-10-CM | POA: Diagnosis not present

## 2015-02-07 DIAGNOSIS — N186 End stage renal disease: Secondary | ICD-10-CM

## 2015-02-07 DIAGNOSIS — E1122 Type 2 diabetes mellitus with diabetic chronic kidney disease: Secondary | ICD-10-CM | POA: Diagnosis not present

## 2015-02-07 MED ORDER — CLOTRIMAZOLE-BETAMETHASONE 1-0.05 % EX CREA: TOPICAL_CREAM | CUTANEOUS | Status: DC

## 2015-02-07 NOTE — Progress Notes (Signed)
Pre visit review using our clinic review tool, if applicable. No additional management support is needed unless otherwise documented below in the visit note. 

## 2015-02-07 NOTE — Patient Instructions (Addendum)
Please take all new medication as prescribed  Please continue all other medications as before, and refills have been done if requested.  Please have the pharmacy call with any other refills you may need.  Please keep your appointments with your specialists as you may have planned  Please go to the LAB in the Basement (turn left off the elevator) for the tests to be done at your convenience  You will be contacted by phone if any changes need to be made immediately.  Otherwise, you will receive a letter about your results with an explanation, but please check with MyChart first.  Please remember to sign up for MyChart if you have not done so, as this will be important to you in the future with finding out test results, communicating by private email, and scheduling acute appointments online when needed.  Please return in 6 months, or sooner if needed

## 2015-02-08 NOTE — Assessment & Plan Note (Signed)
stable overall by history and exam, recent data reviewed with pt, and pt to continue medical treatment as before,  to f/u any worsening symptoms or concerns Lab Results  Component Value Date   HGBA1C 8.0* 09/02/2014    for f/u labs

## 2015-02-08 NOTE — Assessment & Plan Note (Signed)
Mild elev, likely situational, o/w stable overall by history and exam, recent data reviewed with pt, and pt to continue medical treatment as before,  to f/u any worsening symptoms or concerns BP Readings from Last 3 Encounters:  02/07/15 158/62  11/06/14 138/78  10/19/14 172/76

## 2015-02-08 NOTE — Progress Notes (Signed)
Subjective:    Patient ID: Carolyn Valenzuela, female    DOB: 10-Nov-1950, 64 y.o.   MRN: [id number]  HPI  Here to f/u with family, c/o markedly pruritic rash without pain, fever to distal LLE above the ankle, cant stop scratching, though has not drawn blood.  No trauma, fever, or joint swelling.  No prior or recent hx of same.  No obvious contact exposure unusual.  Pt denies chest pain, increased sob or doe, wheezing, orthopnea, PND, increased LE swelling, palpitations, dizziness or syncope.  Pt denies new neurological symptoms such as new headache, or facial or extremity weakness or numbness  .   Pt denies polydipsia, polyuria,  Past Medical History  Diagnosis Date  . DIABETES MELLITUS, UNCONTROLLED 05/21/2009  . HYPERLIPIDEMIA 03/30/2007  . ANEMIA-IRON DEFICIENCY 01/25/2008  . HYPERTENSION 01/25/2008  . SINUSITIS- ACUTE-NOS 01/25/2008  . SECONDARY HYPERPARATHYROIDISM 05/21/2009  . RENAL INSUFFICIENCY 02/01/2008  . CERVICAL RADICULOPATHY, LEFT 01/25/2008  . BACK PAIN 05/02/2009  . NUMBNESS 05/21/2009  . Allergic rhinitis, cause unspecified 12/08/2010  . Foot ulcer     right lateral malleolus  . Critical lower limb ischemia    Past Surgical History  Procedure Laterality Date  . Av fistula placement Left   . Eye surgery Bilateral     cataracts removed, left eye still has some oil in it.  . Colonoscopy    . Amputation Right 03/15/2014    Procedure: RIGHT  LEG  BELOW KNEE AMPUTATION ;  Surgeon: [name], MD;  Location: [facility];  Service: Orthopedics;  Laterality: Right;  . Insertion of dialysis catheter N/A 09/03/2014    Procedure: INSERTION OF DIALYSIS CATHETER RIGHT INTERNAL JUGULAR VEIN;  Surgeon: Conrad [place]ange, MD;  Location: [facility];  Service: Vascular;  Laterality: N/A;  . Av fistula placement Left 09/06/2014    Procedure: ARTERIOVENOUS (AV) FISTULA CREATION-Left Brachiocephalic;  Surgeon: Conrad , MD;  Location: [place];  Service: Vascular;  Laterality: Left;    reports that she has never  smoked. She has never used smokeless tobacco. She reports that she does not drink alcohol or use illicit drugs. family history includes Cancer in her mother. Allergies  Allergen Reactions  . Oxycodone Itching   Current Outpatient Prescriptions on File Prior to Visit  Medication Sig Dispense Refill  . acetaminophen (TYLENOL) 325 MG tablet Take 2 tablets (650 mg total) by mouth every 6 (six) hours as needed for mild pain, moderate pain or fever.    [name] amLODipine (NORVASC) 10 MG tablet TAKE ONE TABLET BY MOUTH ONCE DAILY 30 tablet 6  . aspirin 81 MG EC tablet Take 81 mg by mouth daily.      . Blood Glucose Monitoring Suppl (ONETOUCH VERIO) W/DEVICE KIT 1 each by Does not apply route 2 (two) times daily. 1 kit 0  . calcium carbonate (TUMS - DOSED IN MG ELEMENTAL CALCIUM) 500 MG chewable tablet Chew 2 tablets (400 mg of elemental calcium total) by mouth 3 (three) times daily with meals. 60 tablet 1  . cloNIDine (CATAPRES) 0.1 MG tablet Take 1 tablet (0.1 mg total) by mouth 2 (two) times daily. 60 tablet 0  . furosemide (LASIX) 40 MG tablet     . gabapentin (NEURONTIN) 100 MG capsule Take 1 capsule (100 mg total) by mouth at bedtime. 30 capsule 0  . glipiZIDE (GLUCOTROL XL) 5 MG 24 hr tablet Take 1 tablet (5 mg total) by mouth daily with breakfast. 90 tablet 3  . glucose blood (ONETOUCH VERIO)  test strip 1 each by Other route 2 (two) times daily. Use as instructed 100 each 11  . Multiple Vitamin (MULTIVITAMIN WITH MINERALS) TABS tablet Take 1 tablet by mouth daily.    . ferrous sulfate (FERROUSUL) 325 (65 FE) MG tablet Take 325 mg by mouth daily with breakfast.    . isosorbide mononitrate (IMDUR) 30 MG 24 hr tablet Take 1 tablet (30 mg total) by mouth daily. 30 tablet 0  . metroNIDAZOLE (FLAGYL) 500 MG tablet     . simvastatin (ZOCOR) 20 MG tablet     . traMADol (ULTRAM) 50 MG tablet     . vancomycin (VANCOCIN) 50 mg/mL oral solution Take 2.5 mLs (125 mg total) by mouth every 6 (six) hours. (Patient  not taking: Reported on 02/07/2015) 110 mL 0   No current facility-administered medications on file prior to visit.   Review of Systems  Constitutional: Negative for unusual diaphoresis or night sweats HENT: Negative for ringing in ear or discharge Eyes: Negative for double vision or worsening visual disturbance.  Respiratory: Negative for choking and stridor.   Gastrointestinal: Negative for vomiting or other signifcant bowel change Genitourinary: Negative for hematuria or change in urine volume.  Musculoskeletal: Negative for other MSK pain or swelling Skin: Negative for color change and worsening wound.  Neurological: Negative for tremors and numbness other than noted  Psychiatric/Behavioral: Negative for decreased concentration or agitation other than above       Objective:   Physical Exam BP 158/62 mmHg  Pulse 81  Temp(Src) 98.8 F (37.1 C) (Oral)  Wt 104 lb (47.174 kg)  SpO2 99% VS noted,  Constitutional: Pt appears in no significant distress HENT: Head: NCAT.  Right Ear: External ear normal.  Left Ear: External ear normal.  Eyes: . Pupils are equal, round, and reactive to light. Conjunctivae and EOM are normal Neck: Normal range of motion. Neck supple.  Cardiovascular: Normal rate and regular rhythm.   Pulmonary/Chest: Effort normal and breath sounds without rales or wheezing.  Neurological: Pt is alert. Not confused , motor grossly intact Skin: Skin is warm. + mild non raised diffuse erythem rash surrounding the left leg just above ankle with area of 2-3 cm, no LE edema, no red streaks or drainage Psychiatric: Pt behavior is normal. No agitation.     Assessment & Plan:

## 2015-02-08 NOTE — Assessment & Plan Note (Signed)
?   Contact dermatitis like though no obvious contact, ok for triam cream prn,  to f/u any worsening symptoms or concerns

## 2015-02-15 DIAGNOSIS — E8779 Other fluid overload: Secondary | ICD-10-CM | POA: Insufficient documentation

## 2015-02-26 ENCOUNTER — Telehealth: Payer: Self-pay

## 2015-02-26 ENCOUNTER — Other Ambulatory Visit (INDEPENDENT_AMBULATORY_CARE_PROVIDER_SITE_OTHER): Payer: BC Managed Care – PPO

## 2015-02-26 DIAGNOSIS — E1122 Type 2 diabetes mellitus with diabetic chronic kidney disease: Secondary | ICD-10-CM

## 2015-02-26 DIAGNOSIS — N186 End stage renal disease: Secondary | ICD-10-CM | POA: Diagnosis not present

## 2015-02-26 LAB — BASIC METABOLIC PANEL
BUN: 26 mg/dL — ABNORMAL HIGH (ref 6–23)
CO2: 33 mEq/L — ABNORMAL HIGH (ref 19–32)
Calcium: 9.3 mg/dL (ref 8.4–10.5)
Chloride: 95 mEq/L — ABNORMAL LOW (ref 96–112)
Creatinine, Ser: 4.73 mg/dL (ref 0.40–1.20)
GFR: 12 mL/min — CL (ref >60.00)
Glucose, Bld: 339 mg/dL — ABNORMAL HIGH (ref 70–99)
Potassium: 4 mEq/L (ref 3.5–5.1)
Sodium: 137 mEq/L (ref 135–145)

## 2015-02-26 LAB — LIPID PANEL
Cholesterol: 210 mg/dL — ABNORMAL HIGH (ref 0–200)
HDL: 59.7 mg/dL (ref >39.00)
LDL Cholesterol: 122 mg/dL — ABNORMAL HIGH (ref 0–99)
NonHDL: 150.3
Total CHOL/HDL Ratio: 4
Triglycerides: 143 mg/dL (ref 0.0–149.0)
VLDL: 28.6 mg/dL (ref 0.0–40.0)

## 2015-02-26 LAB — HEPATIC FUNCTION PANEL
ALT: 14 U/L (ref 0–35)
AST: 19 U/L (ref 0–37)
Albumin: 3.7 g/dL (ref 3.5–5.2)
Alkaline Phosphatase: 137 U/L — ABNORMAL HIGH (ref 39–117)
Bilirubin, Direct: 0.1 mg/dL (ref 0.0–0.3)
Total Bilirubin: 0.4 mg/dL (ref 0.2–1.2)
Total Protein: 7.1 g/dL (ref 6.0–8.3)

## 2015-02-26 LAB — HEMOGLOBIN A1C: Hgb A1c MFr Bld: 9.8 % — ABNORMAL HIGH (ref 4.6–6.5)

## 2015-02-26 NOTE — Telephone Encounter (Signed)
Critical value - Creatinine 4.73 BUN 26 GFR 12

## 2015-02-27 ENCOUNTER — Other Ambulatory Visit: Payer: Self-pay | Admitting: Internal Medicine

## 2015-02-27 ENCOUNTER — Encounter: Payer: Self-pay | Admitting: Internal Medicine

## 2015-02-27 DIAGNOSIS — E1122 Type 2 diabetes mellitus with diabetic chronic kidney disease: Secondary | ICD-10-CM

## 2015-03-11 ENCOUNTER — Other Ambulatory Visit: Payer: Self-pay | Admitting: Internal Medicine

## 2015-03-28 ENCOUNTER — Other Ambulatory Visit: Payer: Self-pay | Admitting: Internal Medicine

## 2015-04-18 ENCOUNTER — Ambulatory Visit: Payer: BC Managed Care – PPO | Admitting: Internal Medicine

## 2015-04-18 DIAGNOSIS — Z0289 Encounter for other administrative examinations: Secondary | ICD-10-CM

## 2015-05-13 ENCOUNTER — Other Ambulatory Visit: Payer: Self-pay | Admitting: Physical Medicine & Rehabilitation

## 2015-05-13 DIAGNOSIS — M7989 Other specified soft tissue disorders: Secondary | ICD-10-CM

## 2015-05-14 ENCOUNTER — Ambulatory Visit (HOSPITAL_COMMUNITY)
Admission: RE | Admit: 2015-05-14 | Discharge: 2015-05-14 | Disposition: A | Discharge status: Home / Self Care | Payer: BC Managed Care – PPO | Source: Ambulatory Visit | Attending: Cardiology | Admitting: Cardiology

## 2015-05-14 DIAGNOSIS — M7989 Other specified soft tissue disorders: Secondary | ICD-10-CM | POA: Diagnosis not present

## 2015-05-14 DIAGNOSIS — I10 Essential (primary) hypertension: Secondary | ICD-10-CM | POA: Diagnosis not present

## 2015-05-14 DIAGNOSIS — E119 Type 2 diabetes mellitus without complications: Secondary | ICD-10-CM | POA: Insufficient documentation

## 2015-05-14 DIAGNOSIS — I82812 Embolism and thrombosis of superficial veins of left lower extremities: Secondary | ICD-10-CM | POA: Diagnosis not present

## 2015-05-14 DIAGNOSIS — E785 Hyperlipidemia, unspecified: Secondary | ICD-10-CM | POA: Diagnosis not present

## 2015-05-28 ENCOUNTER — Encounter: Payer: Self-pay | Admitting: Internal Medicine

## 2015-05-28 ENCOUNTER — Ambulatory Visit (INDEPENDENT_AMBULATORY_CARE_PROVIDER_SITE_OTHER): Payer: BC Managed Care – PPO | Admitting: Internal Medicine

## 2015-05-28 ENCOUNTER — Other Ambulatory Visit (INDEPENDENT_AMBULATORY_CARE_PROVIDER_SITE_OTHER): Payer: BC Managed Care – PPO

## 2015-05-28 VITALS — BP 168/70 | HR 126 | Temp 97.6°F | Wt 114.5 lb

## 2015-05-28 DIAGNOSIS — Z0000 Encounter for general adult medical examination without abnormal findings: Secondary | ICD-10-CM

## 2015-05-28 DIAGNOSIS — Z23 Encounter for immunization: Secondary | ICD-10-CM | POA: Diagnosis not present

## 2015-05-28 DIAGNOSIS — R079 Chest pain, unspecified: Secondary | ICD-10-CM | POA: Insufficient documentation

## 2015-05-28 DIAGNOSIS — E119 Type 2 diabetes mellitus without complications: Secondary | ICD-10-CM

## 2015-05-28 DIAGNOSIS — E785 Hyperlipidemia, unspecified: Secondary | ICD-10-CM

## 2015-05-28 DIAGNOSIS — L600 Ingrowing nail: Secondary | ICD-10-CM | POA: Diagnosis not present

## 2015-05-28 DIAGNOSIS — I10 Essential (primary) hypertension: Secondary | ICD-10-CM

## 2015-05-28 LAB — HEMOGLOBIN A1C: Hgb A1c MFr Bld: 11.5 % — ABNORMAL HIGH (ref 4.6–6.5)

## 2015-05-28 LAB — CBC WITH DIFFERENTIAL/PLATELET
Basophils Absolute: 0.1 10*3/uL (ref 0.0–0.1)
Basophils Relative: 0.9 % (ref 0.0–3.0)
Eosinophils Absolute: 0.1 10*3/uL (ref 0.0–0.7)
Eosinophils Relative: 1.4 % (ref 0.0–5.0)
HCT: 31.4 % — ABNORMAL LOW (ref 36.0–46.0)
Hemoglobin: 10.3 g/dL — ABNORMAL LOW (ref 12.0–15.0)
Lymphocytes Relative: 33.9 % (ref 12.0–46.0)
Lymphs Abs: 2.3 10*3/uL (ref 0.7–4.0)
MCHC: 32.7 g/dL (ref 30.0–36.0)
MCV: 93 fl (ref 78.0–100.0)
Monocytes Absolute: 0.5 10*3/uL (ref 0.1–1.0)
Monocytes Relative: 7.2 % (ref 3.0–12.0)
Neutro Abs: 3.9 10*3/uL (ref 1.4–7.7)
Neutrophils Relative %: 56.6 % (ref 43.0–77.0)
Platelets: 204 10*3/uL (ref 150.0–400.0)
RBC: 3.38 Mil/uL — ABNORMAL LOW (ref 3.87–5.11)
RDW: 19.1 % — ABNORMAL HIGH (ref 11.5–15.5)
WBC: 6.9 10*3/uL (ref 4.0–10.5)

## 2015-05-28 LAB — LIPID PANEL
Cholesterol: 213 mg/dL — ABNORMAL HIGH (ref 0–200)
HDL: 52.3 mg/dL (ref >39.00)
LDL Cholesterol: 135 mg/dL — ABNORMAL HIGH (ref 0–99)
NonHDL: 161.19
Total CHOL/HDL Ratio: 4
Triglycerides: 131 mg/dL (ref 0.0–149.0)
VLDL: 26.2 mg/dL (ref 0.0–40.0)

## 2015-05-28 LAB — HEPATIC FUNCTION PANEL
ALT: 11 U/L (ref 0–35)
AST: 15 U/L (ref 0–37)
Albumin: 3.8 g/dL (ref 3.5–5.2)
Alkaline Phosphatase: 148 U/L — ABNORMAL HIGH (ref 39–117)
Bilirubin, Direct: 0.1 mg/dL (ref 0.0–0.3)
Total Bilirubin: 0.4 mg/dL (ref 0.2–1.2)
Total Protein: 7 g/dL (ref 6.0–8.3)

## 2015-05-28 LAB — BASIC METABOLIC PANEL
BUN: 37 mg/dL — ABNORMAL HIGH (ref 6–23)
CO2: 32 mEq/L (ref 19–32)
Calcium: 9.6 mg/dL (ref 8.4–10.5)
Chloride: 97 mEq/L (ref 96–112)
Creatinine, Ser: 5.07 mg/dL (ref 0.40–1.20)
GFR: 11.06 mL/min — CL (ref >60.00)
Glucose, Bld: 373 mg/dL — ABNORMAL HIGH (ref 70–99)
Potassium: 4.2 mEq/L (ref 3.5–5.1)
Sodium: 137 mEq/L (ref 135–145)

## 2015-05-28 LAB — TSH: TSH: 1.52 u[IU]/mL (ref 0.35–4.50)

## 2015-05-28 MED ORDER — SIMVASTATIN 20 MG PO TABS: 20.0000 mg | ORAL_TABLET | Freq: Every day | ORAL | Status: DC

## 2015-05-28 NOTE — Progress Notes (Signed)
Subjective:    Patient ID: Carolyn Valenzuela Comment, female    DOB: August 14, 1951, 64 y.o.   MRN: [id number]  HPI  Here for wellness and f/u;  Overall doing ok;  Pt denies Chest pain, worsening SOB, DOE, wheezing, orthopnea, PND, worsening LE edema, palpitations, dizziness or syncope.  Pt denies neurological change such as new headache, facial or extremity weakness.  Pt denies polydipsia, polyuria, or low sugar symptoms. Pt states overall good compliance with treatment and medications, good tolerability, and has been trying to follow appropriate diet.  Pt denies worsening depressive symptoms, suicidal ideation or panic. No fever, night sweats, wt loss, loss of appetite, or other constitutional symptoms.  Pt states good ability with ADL's, has low fall risk, home safety reviewed and adequate, no other significant changes in hearing or vision, and only occasionally active with exercise.  Has ingrown nail, left 5th toe pain/red/swelling Wt Readings from Last 3 Encounters:  05/28/15 114 lb 8 oz (51.937 kg)  02/07/15 104 lb (47.174 kg)  11/06/14 112 lb (50.803 kg)  c/o lower BP and CP (dull pressure without raidation, no sob/diaphoresis/nausea/dizziness) and fluttering in the chest often post dialysis, each time recnelty. No hx of arrythmia or CAD but has mult risk factors.  HR elevated at intake today, but repeat only 78.  Not taking statin recently, but willing to restart.  Cont's HD on M-W-F  Past Medical History  Diagnosis Date  . DIABETES MELLITUS, UNCONTROLLED 05/21/2009  . HYPERLIPIDEMIA 03/30/2007  . ANEMIA-IRON DEFICIENCY 01/25/2008  . HYPERTENSION 01/25/2008  . SINUSITIS- ACUTE-NOS 01/25/2008  . SECONDARY HYPERPARATHYROIDISM 05/21/2009  . RENAL INSUFFICIENCY 02/01/2008  . CERVICAL RADICULOPATHY, LEFT 01/25/2008  . BACK PAIN 05/02/2009  . NUMBNESS 05/21/2009  . Allergic rhinitis, cause unspecified 12/08/2010  . Foot ulcer     right lateral malleolus  . Critical lower limb ischemia    Past Surgical History    Procedure Laterality Date  . Av fistula placement Left   . Eye surgery Bilateral     cataracts removed, left eye still has some oil in it.  . Colonoscopy    . Amputation Right 03/15/2014    Procedure: RIGHT  LEG  BELOW KNEE AMPUTATION ;  Surgeon: [name], MD;  Location: [place];  Service: Orthopedics;  Laterality: Right;  . Insertion of dialysis catheter N/A 09/03/2014    Procedure: INSERTION OF DIALYSIS CATHETER RIGHT INTERNAL JUGULAR VEIN;  Surgeon: Conrad [place], MD;  Location: [facility];  Service: Vascular;  Laterality: N/A;  . Av fistula placement Left 09/06/2014    Procedure: ARTERIOVENOUS (AV) FISTULA CREATION-Left Brachiocephalic;  Surgeon: Conrad Hudson, MD;  Location: [facility];  Service: Vascular;  Laterality: Left;    reports that she has never smoked. She has never used smokeless tobacco. She reports that she does not drink alcohol or use illicit drugs. family history includes Cancer in her mother. Allergies  Allergen Reactions  . Oxycodone Itching   Current Outpatient Prescriptions on File Prior to Visit  Medication Sig Dispense Refill  . acetaminophen (TYLENOL) 325 MG tablet Take 2 tablets (650 mg total) by mouth every 6 (six) hours as needed for mild pain, moderate pain or fever.    [name] amLODipine (NORVASC) 10 MG tablet TAKE 1 TABLET BY MOUTH DAILY 30 tablet 5  . aspirin 81 MG EC tablet Take 81 mg by mouth daily.      . Blood Glucose Monitoring Suppl (ONETOUCH VERIO) W/DEVICE KIT 1 each by Does not apply route 2 (two)  times daily. 1 kit 0  . calcium carbonate (TUMS - DOSED IN MG ELEMENTAL CALCIUM) 500 MG chewable tablet Chew 2 tablets (400 mg of elemental calcium total) by mouth 3 (three) times daily with meals. (Patient not taking: Reported on 05/28/2015) 60 tablet 1  . cloNIDine (CATAPRES) 0.1 MG tablet Take 1 tablet (0.1 mg total) by mouth 2 (two) times daily. 60 tablet 0  . clotrimazole-betamethasone (LOTRISONE) cream Use as directed to affected area twice per day as needed 45 g 1  .  ferrous sulfate (FERROUSUL) 325 (65 FE) MG tablet Take 325 mg by mouth daily with breakfast.    . furosemide (LASIX) 40 MG tablet TAKE 2 TABLETS BY MOUTH EVERY MORNING AND TAKE 1 TABLET EVERY EVENING 90 tablet 3  . gabapentin (NEURONTIN) 100 MG capsule Take 1 capsule (100 mg total) by mouth at bedtime. 30 capsule 0  . glipiZIDE (GLUCOTROL XL) 5 MG 24 hr tablet Take 1 tablet (5 mg total) by mouth daily with breakfast. 90 tablet 3  . glucose blood (ONETOUCH VERIO) test strip 1 each by Other route 2 (two) times daily. Use as instructed 100 each 11  . isosorbide mononitrate (IMDUR) 30 MG 24 hr tablet Take 1 tablet (30 mg total) by mouth daily. (Patient not taking: Reported on 05/28/2015) 30 tablet 0  . metroNIDAZOLE (FLAGYL) 500 MG tablet     . Multiple Vitamin (MULTIVITAMIN WITH MINERALS) TABS tablet Take 1 tablet by mouth daily.    . simvastatin (ZOCOR) 20 MG tablet     . traMADol (ULTRAM) 50 MG tablet     . vancomycin (VANCOCIN) 50 mg/mL oral solution Take 2.5 mLs (125 mg total) by mouth every 6 (six) hours. (Patient not taking: Reported on 02/07/2015) 110 mL 0   No current facility-administered medications on file prior to visit.   Review of Systems Constitutional: Negative for increased diaphoresis, other activity, appetite or siginficant weight change other than noted HENT: Negative for worsening hearing loss, ear pain, facial swelling, mouth sores and neck stiffness.   Eyes: Negative for other worsening pain, redness or visual disturbance.  Respiratory: Negative for shortness of breath and wheezing  Cardiovascular: Negative for chest pain and palpitations.  Gastrointestinal: Negative for diarrhea, blood in stool, abdominal distention or other pain Genitourinary: Negative for hematuria, flank pain or change in urine volume.  Musculoskeletal: Negative for myalgias or other joint complaints.  Skin: Negative for color change and wound or drainage.  Neurological: Negative for syncope and numbness.  other than noted Hematological: Negative for adenopathy. or other swelling Psychiatric/Behavioral: Negative for hallucinations, SI, self-injury, decreased concentration or other worsening agitation.      Objective:   Physical Exam BP 168/70 mmHg  Pulse 126  Temp(Src) 97.6 F (36.4 C)  Wt 114 lb 8 oz (51.937 kg)  SpO2 91%; repeat HR 78 VS noted,  Constitutional: Pt is oriented to person, place, and time. Appears well-developed and well-nourished, in no significant distress Head: Normocephalic and atraumatic.  Right Ear: External ear normal.  Left Ear: External ear normal.  Nose: Nose normal.  Mouth/Throat: Oropharynx is clear and moist.  Eyes: Conjunctivae and EOM are normal. Pupils are equal, round, and reactive to light.  Neck: Normal range of motion. Neck supple. No JVD present. No tracheal deviation present or significant neck LA or mass Cardiovascular: Normal rate, regular rhythm, normal heart sounds and intact distal pulses.   Pulmonary/Chest: Effort normal and breath sounds without rales or wheezing  Abdominal: Soft. Bowel sounds are normal.  NT. No HSM  Musculoskeletal: Normal range of motion. Exhibits no edema.  Lymphadenopathy:  Has no cervical adenopathy.  Neurological: Pt is alert and oriented to person, place, and time. Pt has normal reflexes. No cranial nerve deficit. Motor grossly intact Skin: Skin is warm and dry. No rash noted.  Psychiatric:  Has normal mood and affect. Behavior is normal.  S/p right BKA    Assessment & Plan:

## 2015-05-28 NOTE — Patient Instructions (Addendum)
You had the flu shot today, and the Pneumovax shot as well  Your EKG was OK today  Please continue all other medications as before, and refills have been done if requested.  Please have the pharmacy call with any other refills you may need.  Please continue your efforts at being more active, low cholesterol diet, and weight control.  You are otherwise up to date with prevention measures today.  You will be contacted regarding the referral for: cardiology, and podiatry  Please keep your appointments with your specialists as you may have planned  Please go to the LAB in the Basement (turn left off the elevator) for the tests to be done today  You will be contacted by phone if any changes need to be made immediately.  Otherwise, you will receive a letter about your results with an explanation, but please check with MyChart first.  Please remember to sign up for MyChart if you have not done so, as this will be important to you in the future with finding out test results, communicating by private email, and scheduling acute appointments online when needed.  Please return in 3 months, or sooner if needed

## 2015-05-28 NOTE — Addendum Note (Signed)
Addended by: Lyman Bishop on: 05/28/2015 12:18 PM   Modules accepted: Orders

## 2015-05-28 NOTE — Assessment & Plan Note (Signed)
stable overall by history and exam, recent data reviewed with pt, and pt to continue medical treatment as before,  to f/u any worsening symptoms or concerns BP Readings from Last 3 Encounters:  05/28/15 168/70  02/07/15 158/62  11/06/14 138/78

## 2015-05-28 NOTE — Assessment & Plan Note (Signed)

## 2015-05-28 NOTE — Addendum Note (Signed)
Addended by: Ander Slade on: 05/28/2015 10:46 AM   Modules accepted: Orders

## 2015-05-28 NOTE — Progress Notes (Signed)
Pre visit review using our clinic review tool, if applicable. No additional management support is needed unless otherwise documented below in the visit note. 

## 2015-05-28 NOTE — Assessment & Plan Note (Signed)
For podiatry referral 

## 2015-05-28 NOTE — Assessment & Plan Note (Signed)
?   Angina post dialysis - for card referral

## 2015-05-28 NOTE — Addendum Note (Signed)
Addended by: Ander Slade on: 05/28/2015 10:52 AM   Modules accepted: Orders

## 2015-05-28 NOTE — Assessment & Plan Note (Signed)
D/w pt importance of compliacne, for re-start statin

## 2015-05-30 ENCOUNTER — Encounter: Payer: Self-pay | Admitting: Podiatry

## 2015-05-30 ENCOUNTER — Ambulatory Visit (INDEPENDENT_AMBULATORY_CARE_PROVIDER_SITE_OTHER): Payer: BC Managed Care – PPO

## 2015-05-30 ENCOUNTER — Other Ambulatory Visit: Payer: Self-pay | Admitting: Internal Medicine

## 2015-05-30 ENCOUNTER — Encounter: Payer: Self-pay | Admitting: Internal Medicine

## 2015-05-30 ENCOUNTER — Ambulatory Visit (INDEPENDENT_AMBULATORY_CARE_PROVIDER_SITE_OTHER): Payer: BC Managed Care – PPO | Admitting: Podiatry

## 2015-05-30 VITALS — BP 150/57 | HR 81 | Resp 16

## 2015-05-30 DIAGNOSIS — E1142 Type 2 diabetes mellitus with diabetic polyneuropathy: Secondary | ICD-10-CM | POA: Diagnosis not present

## 2015-05-30 DIAGNOSIS — B351 Tinea unguium: Secondary | ICD-10-CM | POA: Diagnosis not present

## 2015-05-30 DIAGNOSIS — E119 Type 2 diabetes mellitus without complications: Secondary | ICD-10-CM | POA: Diagnosis not present

## 2015-05-30 DIAGNOSIS — M79676 Pain in unspecified toe(s): Secondary | ICD-10-CM | POA: Diagnosis not present

## 2015-05-30 DIAGNOSIS — E1122 Type 2 diabetes mellitus with diabetic chronic kidney disease: Secondary | ICD-10-CM

## 2015-05-30 DIAGNOSIS — E1151 Type 2 diabetes mellitus with diabetic peripheral angiopathy without gangrene: Secondary | ICD-10-CM | POA: Diagnosis not present

## 2015-05-30 DIAGNOSIS — Q828 Other specified congenital malformations of skin: Secondary | ICD-10-CM | POA: Diagnosis not present

## 2015-05-30 LAB — URINALYSIS, ROUTINE W REFLEX MICROSCOPIC
Bilirubin Urine: NEGATIVE
Ketones, ur: NEGATIVE
Leukocytes, UA: NEGATIVE
Nitrite: NEGATIVE
Specific Gravity, Urine: 1.015 (ref 1.000–1.030)
Total Protein, Urine: >=300 — AB
Urine Glucose: >=1000 — AB
Urobilinogen, UA: 0.2 (ref 0.0–1.0)
pH: 7 (ref 5.0–8.0)

## 2015-05-30 NOTE — Progress Notes (Signed)
   Subjective:    Patient ID: Carolyn Valenzuela, female    DOB: 08-08-1951, 64 y.o.   MRN: [id number]  HPI Comments:   Diabetic since 1995 and last A1C was 11.5  Toe Pain    she presents today with a chief complaint of a painful left foot particularly lateral border of the hallux nail plate left in the lateral border of the fifth nail plate left. She states the painful for quite some time and she cannot work on them herself. She states that she is an uncontrolled diabetic with a history of peripheral vascular disease which resulted in amputation of her right leg for similar problem. She states that she had a painful lesion lateral aspect of the right foot which she trim herself resulting in infection and weight gangrene to the right leg. She states that she has recently seen her vascular doctor for her left lower extremity and that there has been noted treatment change and no surgeries planned. She does have a history of coronary artery disease as well.      Review of Systems  Respiratory: Positive for apnea.   Musculoskeletal: Positive for myalgias and gait problem.  All other systems reviewed and are negative.      Objective:   Physical Exam: 64 year old female in no apparent distress history of dictation right leg below the knee. History of severe peripheral vascular disease with diabetes resulting in end-stage renal failure and wet gangrene. Pulses are nonpalpable left foot. Left foot is warm to the touch. Neurologic sensorium is diminished per Semmes-Weinstein monofilament. Deep tendon reflexes are not elicitable left. Muscle strength is normal left. Orthopedic evaluation demonstrates all joints distal to the ankle for range of motion without crepitation. Cutaneous evaluation illustrates dry skin not supple like leather. The skin is drawn tight with no open lesions and no tenderness to the foot other than the area in question overlying the lateral hallux nail plate left which is slightly  ingrown, the remainder of the nails are thick and yellow possibly mycotic. and a hypertrophic/hyperkeratotic lesion lateral aspect of the fifth toe left. No open wounds or lesions. Radiographs of the left foot demonstrate severe osteoarthritis of the foot and severe medial calcific sclerosis of the veins and arteries of the left lower extremity.      Assessment & Plan:  Diabetes mellitus with severe diabetic peripheral neuropathy and diabetic angiopathy. History of amputation right lower extremity. Pain in limb secondary to mycotic nail and a hyperkeratotic lesion.  Plan: Debrided all reactive hyperkeratoses. Debrided nails 1 through 5 left foot. No iatrogenic lesions noted. She will follow up with [place] with any questions or concerns.

## 2015-05-30 NOTE — Patient Instructions (Signed)
Diabetes and Foot Care Diabetes may cause you to have problems because of poor blood supply (circulation) to your feet and legs. This may cause the skin on your feet to become thinner, break easier, and heal more slowly. Your skin may become dry, and the skin may peel and crack. You may also have nerve damage in your legs and feet causing decreased feeling in them. You may not notice minor injuries to your feet that could lead to infections or more serious problems. Taking care of your feet is one of the most important things you can do for yourself.  HOME CARE INSTRUCTIONS  Wear shoes at all times, even in the house. Do not go barefoot. Bare feet are easily injured.  Check your feet daily for blisters, cuts, and redness. If you cannot see the bottom of your feet, use a mirror or ask someone for help.  Wash your feet with warm water (do not use hot water) and mild soap. Then pat your feet and the areas between your toes until they are completely dry. Do not soak your feet as this can dry your skin.  Apply a moisturizing lotion or petroleum jelly (that does not contain alcohol and is unscented) to the skin on your feet and to dry, brittle toenails. Do not apply lotion between your toes.  Trim your toenails straight across. Do not dig under them or around the cuticle. File the edges of your nails with an emery board or nail file.  Do not cut corns or calluses or try to remove them with medicine.  Wear clean socks or stockings every day. Make sure they are not too tight. Do not wear knee-high stockings since they may decrease blood flow to your legs.  Wear shoes that fit properly and have enough cushioning. To break in new shoes, wear them for just a few hours a day. This prevents you from injuring your feet. Always look in your shoes before you put them on to be sure there are no objects inside.  Do not cross your legs. This may decrease the blood flow to your feet.  If you find a minor scrape,  cut, or break in the skin on your feet, keep it and the skin around it clean and dry. These areas may be cleansed with mild soap and water. Do not cleanse the area with peroxide, alcohol, or iodine.  When you remove an adhesive bandage, be sure not to damage the skin around it.  If you have a wound, look at it several times a day to make sure it is healing.  Do not use heating pads or hot water bottles. They may burn your skin. If you have lost feeling in your feet or legs, you may not know it is happening until it is too late.  Make sure your health care provider performs a complete foot exam at least annually or more often if you have foot problems. Report any cuts, sores, or bruises to your health care provider immediately. SEEK MEDICAL CARE IF:   You have an injury that is not healing.  You have cuts or breaks in the skin.  You have an ingrown nail.  You notice redness on your legs or feet.  You feel burning or tingling in your legs or feet.  You have pain or cramps in your legs and feet.  Your legs or feet are numb.  Your feet always feel cold. SEEK IMMEDIATE MEDICAL CARE IF:   There is increasing redness,   swelling, or pain in or around a wound.  There is a red line that goes up your leg.  Pus is coming from a wound.  You develop a fever or as directed by your health care provider.  You notice a bad smell coming from an ulcer or wound. Document Released: 08/14/2000 Document Revised: 04/19/2013 Document Reviewed: 01/24/2013 Exit[employer] Patient Information 2015 [employer]. This information is not intended to replace advice given to you by your health care provider. Make sure you discuss any questions you have with your health care provider.  

## 2015-06-06 ENCOUNTER — Encounter: Payer: Self-pay | Admitting: Physician Assistant

## 2015-06-06 ENCOUNTER — Ambulatory Visit (INDEPENDENT_AMBULATORY_CARE_PROVIDER_SITE_OTHER): Payer: BC Managed Care – PPO | Admitting: Physician Assistant

## 2015-06-06 VITALS — BP 138/60 | Ht <= 58 in | Wt 114.6 lb

## 2015-06-06 DIAGNOSIS — R0789 Other chest pain: Secondary | ICD-10-CM | POA: Diagnosis not present

## 2015-06-06 DIAGNOSIS — I10 Essential (primary) hypertension: Secondary | ICD-10-CM

## 2015-06-06 DIAGNOSIS — E785 Hyperlipidemia, unspecified: Secondary | ICD-10-CM | POA: Diagnosis not present

## 2015-06-06 DIAGNOSIS — N186 End stage renal disease: Secondary | ICD-10-CM | POA: Diagnosis not present

## 2015-06-06 DIAGNOSIS — Z992 Dependence on renal dialysis: Secondary | ICD-10-CM

## 2015-06-06 NOTE — Patient Instructions (Signed)
Your physician recommends that you schedule a follow-up appointment in: 3 months with Dr Berry 

## 2015-06-06 NOTE — Progress Notes (Signed)
Patient ID: Carolyn Valenzuela, female   DOB: May 09, 1951, 64 y.o.   MRN: [id number]    Date:  06/06/2015   ID:  Carolyn Valenzuela, DOB June 25, 1951, MRN [id number]  PCP:  Cathlean Cower, MD  Primary Cardiologist:  Gwenlyn Found  Chief Complaint  Patient presents with  . Follow-up      No complaints of chest pain, edema or dizziness.  SOB during dialysis when her BP drops.     History of Present Illness:  Carolyn Valenzuela is a 64 y.o. female with h/o type 2 diabetes, hyperlipidemia, Hypertension, chronic kidney disease stage 4, not on dialysis.  The patient was seen in May 2015 for evaluation of peripheral vascular disease.  She has never smoked. She denies any history of heart disease and any family history of ischemic heart disease.  Lower extremity arterial Doppler studies did not show significant obstructive disease however, she has diminished TBIs bilaterally suggesting the possibility of poor healing as a result of a surgical procedure. The patient ultimately underwent a right BKA in July 2015.  She now is getting hemodialysis Monday Wednesday Friday.  She was set up for appointment through her primary care doctor because of complaints of chest pain. She says that usually happens either at dialysis or when she is just at home laying down. The pain is sharp. He can be 7-8 out of 10 in intensity and lasts just a few minutes. No nausea, vomiting, shortness of breath, diaphoresis or radiation to the head and neck or jaw. It does not occur with exertion. She is to walk up a ramp to her house.    The patient currently denies nausea, vomiting, fever, shortness of breath, orthopnea, dizziness, PND, cough, congestion, abdominal pain, hematochezia, melena, lower extremity edema, claudication.  Wt Readings from Last 3 Encounters:  06/06/15 51.982 kg (114 lb 9.6 oz)  05/28/15 51.937 kg (114 lb 8 oz)  02/07/15 47.174 kg (104 lb)    Family History  Problem Relation Age of Onset  . Cancer Mother     Pancreatic      Past Medical History  Diagnosis Date  . DIABETES MELLITUS, UNCONTROLLED 05/21/2009  . HYPERLIPIDEMIA 03/30/2007  . ANEMIA-IRON DEFICIENCY 01/25/2008  . HYPERTENSION 01/25/2008  . SINUSITIS- ACUTE-NOS 01/25/2008  . SECONDARY HYPERPARATHYROIDISM 05/21/2009  . RENAL INSUFFICIENCY 02/01/2008  . CERVICAL RADICULOPATHY, LEFT 01/25/2008  . BACK PAIN 05/02/2009  . NUMBNESS 05/21/2009  . Allergic rhinitis, cause unspecified 12/08/2010  . Foot ulcer ([place])     right lateral malleolus  . Critical lower limb ischemia    Past Surgical History  Procedure Laterality Date  . Av fistula placement Left   . Eye surgery Bilateral     cataracts removed, left eye still has some oil in it.  . Colonoscopy    . Amputation Right 03/15/2014    Procedure: RIGHT  LEG  BELOW KNEE AMPUTATION ;  Surgeon: [name], MD;  Location: [place];  Service: Orthopedics;  Laterality: Right;  . Insertion of dialysis catheter N/A 09/03/2014    Procedure: INSERTION OF DIALYSIS CATHETER RIGHT INTERNAL JUGULAR VEIN;  Surgeon: Conrad Byram Center, MD;  Location: [place];  Service: Vascular;  Laterality: N/A;  . Av fistula placement Left 09/06/2014    Procedure: ARTERIOVENOUS (AV) FISTULA CREATION-Left Brachiocephalic;  Surgeon: Conrad Port Richey, MD;  Location: [place];  Service: Vascular;  Laterality: Left;    Current Outpatient Prescriptions  Medication Sig Dispense Refill  . acetaminophen (TYLENOL) 325 MG tablet Take 2 tablets (650  mg total) by mouth every 6 (six) hours as needed for mild pain, moderate pain or fever.    [name] amLODipine (NORVASC) 10 MG tablet TAKE 1 TABLET BY MOUTH DAILY 30 tablet 5  . aspirin 81 MG EC tablet Take 81 mg by mouth daily.      . Blood Glucose Monitoring Suppl (ONETOUCH VERIO) W/DEVICE KIT 1 each by Does not apply route 2 (two) times daily. 1 kit 0  . cloNIDine (CATAPRES) 0.1 MG tablet Take 1 tablet (0.1 mg total) by mouth 2 (two) times daily. 60 tablet 0  . clotrimazole-betamethasone (LOTRISONE) cream Use as directed to  affected area twice per day as needed 45 g 1  . FOSRENOL 1000 MG chewable tablet Chew 1 tablet by mouth 3 (three) times daily.    . furosemide (LASIX) 40 MG tablet TAKE 2 TABLETS BY MOUTH EVERY MORNING AND TAKE 1 TABLET EVERY EVENING 90 tablet 3  . gabapentin (NEURONTIN) 100 MG capsule Take 1 capsule (100 mg total) by mouth at bedtime. 30 capsule 0  . glipiZIDE (GLUCOTROL XL) 5 MG 24 hr tablet Take 1 tablet (5 mg total) by mouth daily with breakfast. 90 tablet 3  . glucose blood (ONETOUCH VERIO) test strip 1 each by Other route 2 (two) times daily. Use as instructed 100 each 11  . Multiple Vitamin (MULTIVITAMIN WITH MINERALS) TABS tablet Take 1 tablet by mouth daily.    . simvastatin (ZOCOR) 20 MG tablet Take 1 tablet (20 mg total) by mouth daily at 6 PM. 90 tablet 3   No current facility-administered medications for this visit.    Allergies:    Allergies  Allergen Reactions  . Oxycodone Itching    Social History:  The patient  reports that she has never smoked. She has never used smokeless tobacco. She reports that she does not drink alcohol or use illicit drugs.   Family history:   Family History  Problem Relation Age of Onset  . Cancer Mother     Pancreatic    ROS:  Please see the history of present illness.  All other systems reviewed and negative.   PHYSICAL EXAM: VS:  BP 138/60 mmHg  Ht _0  (1.448 m)  Wt 51.982 kg (114 lb 9.6 oz)  BMI 24.79 kg/m2 Well nourished, well developed, in no acute distress HEENT: Pupils are equal round react to light accommodation extraocular movements are intact.  Neck: no JVDNo cervical lymphadenopathy. Cardiac: Regular rate and rhythm without murmurs rubs or gallops. Lungs:  clear to auscultation bilaterally, no wheezing, rhonchi or rales Abd: soft, nontender, positive bowel sounds all quadrants, no hepatosplenomegaly Ext: no lower extremity edema.  2+ right radial and 1+ left dorsalis pedis and posterior tibialis pulses. Skin: warm and  dry Neuro:  Grossly normal  EKG-05/28/2015:  Normal sinus rhythm, rate 76 bpm  ASSESSMENT AND PLAN:  Problem List Items Addressed This Visit    Hyperlipidemia   Essential hypertension   ESRD needing dialysis ([place])   Chest pain, atypical - Primary      Chest pain, atypical:  Patient's pain is only happening at rest. Does not sound like angina.  EKG shows normal sinus rhythm. Has not occurred in the last month. I will have her follow-up in 3 months with Dr. Gwenlyn Found.  she'll keep track if she has any additional episodes if become more frequent or with exertion, she'll call immediately for evaluation sooner.

## 2015-07-16 ENCOUNTER — Telehealth: Payer: Self-pay | Admitting: *Deleted

## 2015-07-16 NOTE — Telephone Encounter (Addendum)
Pt states the top of her toenail came off should she soak in alcohol or use an ointment.  Unable to leave message there was no voicemail.  Unable to leave message on home phone, called mobile phone, spoke with pt's sister, [name] and informed we need to see pt as soon as possible, and to begin warm salt water soaks followed by Neosporin dressing until seen.  Transferred [name] to schedulers.

## 2015-07-17 ENCOUNTER — Other Ambulatory Visit: Payer: Self-pay | Admitting: *Deleted

## 2015-07-17 DIAGNOSIS — L97509 Non-pressure chronic ulcer of other part of unspecified foot with unspecified severity: Secondary | ICD-10-CM

## 2015-07-18 ENCOUNTER — Encounter: Payer: Self-pay | Admitting: Podiatry

## 2015-07-18 ENCOUNTER — Ambulatory Visit (INDEPENDENT_AMBULATORY_CARE_PROVIDER_SITE_OTHER): Payer: BC Managed Care – PPO | Admitting: Podiatry

## 2015-07-18 VITALS — BP 166/59 | HR 77 | Resp 16

## 2015-07-18 DIAGNOSIS — L03032 Cellulitis of left toe: Secondary | ICD-10-CM | POA: Diagnosis not present

## 2015-07-18 DIAGNOSIS — E1151 Type 2 diabetes mellitus with diabetic peripheral angiopathy without gangrene: Secondary | ICD-10-CM

## 2015-07-18 DIAGNOSIS — L03012 Cellulitis of left finger: Secondary | ICD-10-CM

## 2015-07-18 NOTE — Progress Notes (Signed)
Subjective:     Patient ID: Carolyn Valenzuela Comment, female   DOB: 1951/02/11, 64 y.o.   MRN: [id number]  HPI patient presents stating I'm having draining from my left big toenail and I have lost my right leg due to vascular disease of my left leg is followed closely and they seem to feel have adequate circulation   Review of Systems     Objective:   Physical Exam Neurovascular status left is diminished but does not appear to be different from previous visits with a damaged left hallux nail that is loose with localized drainage and no proximal edema erythema drainage noted    Assessment:     Localized paronychia infection left that appears to be secondary to damage with no indications of proximal problems    Plan:     Reviewed condition and at this time infiltrated the left hallux 60 mg Xylocaine Marcaine and then remove the nail and carefully flushed out tissue and applied sterile dressing. Reappoint for [place] to recheck and did discuss at one point amputation may be necessary in the future if healing does not occur

## 2015-07-18 NOTE — Patient Instructions (Signed)
ANTIBACTERIAL SOAP INSTRUCTIONS  THE DAY AFTER PROCEDURE  Please follow the instructions your doctor has marked.   Shower as usual. Before getting out, place a drop of antibacterial liquid soap (Dial) on a wet, clean washcloth.  Gently wipe washcloth over affected area.  Afterward, rinse the area with warm water.  Blot the area dry with a soft cloth and cover with antibiotic ointment (neosporin, polysporin, bacitracin) and band aid or gauze and tape  Place 3-4 drops of antibacterial liquid soap in a quart of warm tap water.  Submerge foot into water for 20 minutes.  If bandage was applied after your procedure, leave on to allow for easy lift off, then remove and continue with soak for the remaining time.  Next, blot area dry with a soft cloth and cover with a bandage.  Apply other medications as directed by your doctor, such as cortisporin otic solution (eardrops) or neosporin antibiotic ointment   Long Term Care Instructions-Post Nail Surgery  You have had your ingrown toenail and root treated with a chemical.  This chemical causes a burn that will drain and ooze like a blister.  This can drain for 6-8 weeks or longer.  It is important to keep this area clean, covered, and follow the soaking instructions dispensed at the time of your surgery.  This area will eventually dry and form a scab.  Once the scab forms you no longer need to soak or apply a dressing.  If at any time you experience an increase in pain, redness, swelling, or drainage, you should contact the office as soon as possible. 

## 2015-07-23 ENCOUNTER — Telehealth: Payer: Self-pay | Admitting: *Deleted

## 2015-07-23 NOTE — Telephone Encounter (Signed)
Called patient at [phone] (Cell #) to check to see how they were doing from their nail that was removed on Thursday, July 18, 2015. I talked to patient's sister and she said her sister, "is trying to keep clean". She is going to have her sister call me back.

## 2015-07-23 NOTE — Telephone Encounter (Signed)
Patient called me back and stated, "toe is still hurting and has some pain". Pt is taking Tylenol, which is helping with their pain some of the time. Pt also stated, "when soak toe, it feels better".

## 2015-07-30 ENCOUNTER — Ambulatory Visit: Payer: BC Managed Care – PPO | Admitting: Podiatry

## 2015-08-01 ENCOUNTER — Ambulatory Visit (INDEPENDENT_AMBULATORY_CARE_PROVIDER_SITE_OTHER): Payer: BC Managed Care – PPO | Admitting: Podiatry

## 2015-08-01 ENCOUNTER — Encounter: Payer: Self-pay | Admitting: Podiatry

## 2015-08-01 DIAGNOSIS — E1151 Type 2 diabetes mellitus with diabetic peripheral angiopathy without gangrene: Secondary | ICD-10-CM

## 2015-08-01 DIAGNOSIS — L03012 Cellulitis of left finger: Secondary | ICD-10-CM

## 2015-08-01 DIAGNOSIS — L03032 Cellulitis of left toe: Secondary | ICD-10-CM | POA: Diagnosis not present

## 2015-08-02 ENCOUNTER — Encounter: Payer: Self-pay | Admitting: Vascular Surgery

## 2015-08-02 NOTE — Progress Notes (Signed)
Subjective:     Patient ID: Carolyn Valenzuela Comment, female   DOB: [birth year], 64 y.o.   MRN: [id number]  HPI patient presents stating this left big toe has been bothering me and I'm due to have my circulation checked next week   Review of Systems     Objective:   Physical Exam Severe vascular disease with loss of right leg which had occurred from previous vascular incident with obvious stress on the digits of the left foot and irritation of the distal left hallux. There is no current indication of infection with no drainage noted or proximal edema erythema drainage noted    Assessment:     Patient experiencing severe vascular disease with the simplest procedure I can perform to try to reduce the stress on the big toe but continued stress on the toe and all remaining digits on the left foot    Plan:     Explained to her and caregiver that there is a distinct possibility that amputation will be necessary left digits possible forefoot or possible below-knee depending on results of vascular studies. Unfortunately this patient has severe vascular disease which may not be addressable by the vascular doctors but will be determined next week when she's evaluated. Today I did dispense surgical shoe and applied a Silvadene-type dressing and instructed on home dressings to the big toe left

## 2015-08-05 ENCOUNTER — Encounter: Payer: Self-pay | Admitting: Gastroenterology

## 2015-08-06 ENCOUNTER — Other Ambulatory Visit: Payer: Self-pay | Admitting: Vascular Surgery

## 2015-08-06 ENCOUNTER — Ambulatory Visit (HOSPITAL_COMMUNITY)
Admission: RE | Admit: 2015-08-06 | Discharge: 2015-08-06 | Disposition: A | Discharge status: Home / Self Care | Payer: BC Managed Care – PPO | Source: Ambulatory Visit | Attending: Vascular Surgery | Admitting: Vascular Surgery

## 2015-08-06 DIAGNOSIS — L97529 Non-pressure chronic ulcer of other part of left foot with unspecified severity: Secondary | ICD-10-CM | POA: Diagnosis not present

## 2015-08-06 DIAGNOSIS — E785 Hyperlipidemia, unspecified: Secondary | ICD-10-CM | POA: Insufficient documentation

## 2015-08-06 DIAGNOSIS — I10 Essential (primary) hypertension: Secondary | ICD-10-CM | POA: Diagnosis not present

## 2015-08-06 DIAGNOSIS — E1169 Type 2 diabetes mellitus with other specified complication: Secondary | ICD-10-CM | POA: Diagnosis not present

## 2015-08-09 ENCOUNTER — Ambulatory Visit (INDEPENDENT_AMBULATORY_CARE_PROVIDER_SITE_OTHER): Payer: BC Managed Care – PPO | Admitting: Vascular Surgery

## 2015-08-09 ENCOUNTER — Other Ambulatory Visit: Payer: Self-pay

## 2015-08-09 ENCOUNTER — Encounter: Payer: Self-pay | Admitting: Vascular Surgery

## 2015-08-09 VITALS — BP 165/76 | HR 70 | Temp 97.7°F | Resp 16 | Ht 59.5 in | Wt 123.0 lb

## 2015-08-09 DIAGNOSIS — I998 Other disorder of circulatory system: Secondary | ICD-10-CM | POA: Diagnosis not present

## 2015-08-09 DIAGNOSIS — I70229 Atherosclerosis of native arteries of extremities with rest pain, unspecified extremity: Secondary | ICD-10-CM

## 2015-08-09 MED ORDER — CEPHALEXIN 500 MG PO CAPS: 500.0000 mg | ORAL_CAPSULE | Freq: Three times a day (TID) | ORAL | Status: DC

## 2015-08-09 NOTE — Progress Notes (Signed)
Referred by:  Biagio Borg, MD [address] [place], [place] [address]  Reason for referral: left great toe ischemia  History of Present Illness  Carolyn Valenzuela is a 64 y.o. ([birth year]) female s/p R BKA who presents with chief complaint: poorly healing left great toe.  Onset of symptoms occurred several months ago.  In late Sept, she went to Podiatry and had debridement of her nails.  She has not healed up her great toe.  She also recently dropped something onto her left great toe.  Pain is described as aching, severity 1-5/10, and associated with manipulating her great toe.  Patient has attempted to treat this pain with rest and OTC pain medications.  The patient has no rest pain symptoms.  She denies any fever or chills.  She denies any drainage..  Atherosclerotic risk factors include: uncontrolled DM, HLD, HTN.  Past Medical History  Diagnosis Date  . DIABETES MELLITUS, UNCONTROLLED 05/21/2009  . HYPERLIPIDEMIA 03/30/2007  . ANEMIA-IRON DEFICIENCY 01/25/2008  . HYPERTENSION 01/25/2008  . SINUSITIS- ACUTE-NOS 01/25/2008  . SECONDARY HYPERPARATHYROIDISM 05/21/2009  . RENAL INSUFFICIENCY 02/01/2008  . CERVICAL RADICULOPATHY, LEFT 01/25/2008  . BACK PAIN 05/02/2009  . NUMBNESS 05/21/2009  . Allergic rhinitis, cause unspecified 12/08/2010  . Foot ulcer ([place])     right lateral malleolus  . Critical lower limb ischemia     Past Surgical History  Procedure Laterality Date  . Av fistula placement Left   . Eye surgery Bilateral     cataracts removed, left eye still has some oil in it.  . Colonoscopy    . Amputation Right 03/15/2014    Procedure: RIGHT  LEG  BELOW KNEE AMPUTATION ;  Surgeon: [name], MD;  Location: [facility];  Service: Orthopedics;  Laterality: Right;  . Insertion of dialysis catheter N/A 09/03/2014    Procedure: INSERTION OF DIALYSIS CATHETER RIGHT INTERNAL JUGULAR VEIN;  Surgeon: Conrad Graves, MD;  Location: [facility];  Service: Vascular;  Laterality: N/A;  . Av fistula  placement Left 09/06/2014    Procedure: ARTERIOVENOUS (AV) FISTULA CREATION-Left Brachiocephalic;  Surgeon: Conrad , MD;  Location: [facility];  Service: Vascular;  Laterality: Left;    Social History   Social History  . Marital Status: Single    Spouse Name: N/A  . Number of Children: N/A  . Years of Education: N/A   Occupational History  . [place] History Main Topics  . Smoking status: Never Smoker   . Smokeless tobacco: Never Used  . Alcohol Use: No  . Drug Use: No  . Sexual Activity: Not on file   Other Topics Concern  . Not on file   Social History Narrative     Family History  Problem Relation Age of Onset  . Cancer Mother     Pancreatic    Current Outpatient Prescriptions  Medication Sig Dispense Refill  . amLODipine (NORVASC) 10 MG tablet TAKE 1 TABLET BY MOUTH DAILY 30 tablet 5  . aspirin 81 MG EC tablet Take 81 mg by mouth daily.      . Blood Glucose Monitoring Suppl (ONETOUCH VERIO) W/DEVICE KIT 1 each by Does not apply route 2 (two) times daily. 1 kit 0  . cloNIDine (CATAPRES) 0.1 MG tablet Take 1 tablet (0.1 mg total) by mouth 2 (two) times daily. 60 tablet 0  . clotrimazole-betamethasone (LOTRISONE) cream Use as directed to affected area twice per day as needed 45 g  1  . FOSRENOL 1000 MG chewable tablet Chew 1 tablet by mouth 3 (three) times daily.    . furosemide (LASIX) 40 MG tablet TAKE 2 TABLETS BY MOUTH EVERY MORNING AND TAKE 1 TABLET EVERY EVENING 90 tablet 3  . gabapentin (NEURONTIN) 100 MG capsule Take 1 capsule (100 mg total) by mouth at bedtime. 30 capsule 0  . glipiZIDE (GLUCOTROL XL) 5 MG 24 hr tablet Take 1 tablet (5 mg total) by mouth daily with breakfast. 90 tablet 3  . glucose blood (ONETOUCH VERIO) test strip 1 each by Other route 2 (two) times daily. Use as instructed 100 each 11  . Multiple Vitamin (MULTIVITAMIN WITH MINERALS) TABS tablet Take 1 tablet by mouth daily.    . simvastatin (ZOCOR) 20 MG  tablet Take 1 tablet (20 mg total) by mouth daily at 6 PM. 90 tablet 3  . acetaminophen (TYLENOL) 325 MG tablet Take 2 tablets (650 mg total) by mouth every 6 (six) hours as needed for mild pain, moderate pain or fever.    . cephALEXin (KEFLEX) 500 MG capsule Take 1 capsule (500 mg total) by mouth 3 (three) times daily. 30 capsule 0   No current facility-administered medications for this visit.    Allergies  Allergen Reactions  . Oxycodone Itching     REVIEW OF SYSTEMS:  (Positives checked otherwise negative)  CARDIOVASCULAR:   '[ ]'  chest pain,  '[ ]'  chest pressure,  '[ ]'  palpitations,  '[ ]'  shortness of breath when laying flat,  '[ ]'  shortness of breath with exertion,   '[ ]'  pain in feet when walking,  '[x]'  pain in feet when laying flat, '[ ]'  history of blood clot in veins (DVT),  '[ ]'  history of phlebitis,  '[ ]'  swelling in legs,  '[ ]'  varicose veins  PULMONARY:   '[ ]'  productive cough,  '[ ]'  asthma,  '[ ]'  wheezing  NEUROLOGIC:   '[ ]'  weakness in arms or legs,  '[x]'  numbness in arms or legs,  '[ ]'  difficulty speaking or slurred speech,  '[ ]'  temporary loss of vision in one eye,  '[ ]'  dizziness  HEMATOLOGIC:   '[ ]'  bleeding problems,  '[ ]'  problems with blood clotting too easily  MUSCULOSKEL:   '[ ]'  joint pain, '[ ]'  joint swelling  GASTROINTEST:   '[ ]'  vomiting blood,  '[ ]'  blood in stool     GENITOURINARY:   '[ ]'  burning with urination,  '[ ]'  blood in urine  PSYCHIATRIC:   '[ ]'  history of major depression  INTEGUMENTARY:   '[ ]'  rashes,  '[ ]'  ulcers  CONSTITUTIONAL:   '[ ]'  fever,  '[ ]'  chills   For VQI Use Only  PRE-ADM LIVING: Home  AMB STATUS: Ambulatory  CAD Sx: None  PRIOR CHF: None  STRESS TEST: '[x]'  No, '[ ]'  Normal, '[ ]'  + ischemia, '[ ]'  + MI, '[ ]'  Both   Physical Examination  Filed Vitals:   08/09/15 1017 08/09/15 1024  BP: 165/74 165/76  Pulse: 72 70  Temp: 97.7 F (36.5 C)   TempSrc: Oral   Resp: 16   Height: 4' 11.5" (1.511 m)   Weight: 123 lb (55.792  kg)   SpO2: 100%    Body mass index is 24.44 kg/(m^2).  General: A&O x 3, WD, thin  Head: [place]/AT, Temporalis wasting,   Ear/Nose/Throat: Hearing grossly intact, nares w/o erythema or drainage, oropharynx w/o Erythema/Exudate, Mallampati score: 3  Eyes: PERRLA, EOMI  Neck: Supple, no  nuchal rigidity, no palpable LAD  Pulmonary: Sym exp, good air movt, CTAB, no rales, rhonchi, & wheezing  Cardiac: RRR, Nl S1, S2, no Murmurs, rubs or gallops  Vascular: Vessel Right Left  Radial Palpable Palpable  Ulnar Not Palpable Not Palpable  Brachial Palpable Palpable  Carotid Palpable, without bruit Palpable, without bruit  Aorta Not palpable N/A  Femoral Palpable Palpable  Popliteal Not palpable Not palpable  PT BKA Not Palpable  DP BKA Not Palpable   Gastrointestinal: soft, NTND, -G/R, - HSM, - masses, - CVAT B  Musculoskeletal: M/S 5/5 throughout , R BKA with prosthesis, L foot with dependent rubor, ischemic appearing great toe extending to metatarsal, injuried appeared distal 1st great phalange, decreased sensation in left foot; L upper arm fistula with thrill and bruit, bruit consistent with likely distal stenosis  Neurologic: CN 2-12 intact , Pain and light touch intact in extremities except decreased in L foot, Motor exam as listed above  Psychiatric: Judgment intact, Mood & affect appropriate for pt's clinical situation  Dermatologic: See M/S exam for extremity exam, no rashes otherwise noted  Lymph : No Cervical, Axillary, or Inguinal lymphadenopathy    Non-Invasive Vascular Imaging  ABI (Date: 08/09/2015)  L: 0.92, DP: mono, PT: mono, TBI: ND   Medical Decision Making  Carolyn Valenzuela is a 64 y.o. female who presents with: BLE critical limb ischemia, s/p R BKA, possible cellulitis L foot, likely stenosis segment in L BC AVF   Obviously this patient is at high risk for limb loss and she is aware of such.  Prescribed Keflex 500 mg 1 po tid x 10 day for presumed  cellultis.  I suspect her ABI are falsely elevated and this patient has significant calcific atherosclerosis on multiple levels.  I discussed with the patient the natural history of critical limb ischemia: 25% require amputation in one year, 50% are able to maintain their limbs in one year, and 25-30% die in one year due to comorbidities.  Given the limb threatening status of this patient, I recommend an aggressive work up including proceeding with an: Aortogram, Left runoff and possible intervention. I discussed with the patient the nature of angiographic procedures, especially the limited patencies of any endovascular intervention. The patient is aware of that the risks of an angiographic procedure include but are not limited to: bleeding, infection, access site complications, embolization, rupture of treated vessel, dissection, possible need for emergent surgical intervention, and possible need for surgical procedures to treat the patient's pathology. The patient is aware of the risks and agrees to proceed.  The procedure is scheduled for: 13 DEC 16.  I discussed in depth with the patient the nature of atherosclerosis, and emphasized the importance of maximal medical management including strict control of blood pressure, blood glucose, and lipid levels, antiplatelet agents, obtaining regular exercise, and cessation of smoking.  The patient is aware that without maximal medical management the underlying atherosclerotic disease process will progress, limiting the benefit of any interventions. The patient is not currently on a statin: as reportedly not indicated. The patient is currently on an anti-platelet: ASA.  In regards to the venous stenosis in the L [facility] AVF, he is already scheduled for a outpatient fistulogram with IN.  Thank you for allowing [place] to participate in this patient's care.   Adele Barthel, MD [facility] Office: [phone] Pager:  [phone]  08/09/2015, 10:59 AM

## 2015-08-13 ENCOUNTER — Ambulatory Visit (HOSPITAL_COMMUNITY)
Admission: RE | Admit: 2015-08-13 | Discharge: 2015-08-13 | Disposition: A | Discharge status: Home / Self Care | Payer: BC Managed Care – PPO | Source: Ambulatory Visit | Attending: Surgery | Admitting: Surgery

## 2015-08-13 ENCOUNTER — Encounter (HOSPITAL_COMMUNITY): Payer: Self-pay | Admitting: Vascular Surgery

## 2015-08-13 ENCOUNTER — Encounter (HOSPITAL_COMMUNITY)
Admission: RE | Disposition: A | Discharge status: Home / Self Care | Payer: Self-pay | Source: Ambulatory Visit | Attending: Surgery

## 2015-08-13 DIAGNOSIS — N289 Disorder of kidney and ureter, unspecified: Secondary | ICD-10-CM | POA: Diagnosis not present

## 2015-08-13 DIAGNOSIS — Z89511 Acquired absence of right leg below knee: Secondary | ICD-10-CM | POA: Insufficient documentation

## 2015-08-13 DIAGNOSIS — D509 Iron deficiency anemia, unspecified: Secondary | ICD-10-CM | POA: Diagnosis not present

## 2015-08-13 DIAGNOSIS — E1165 Type 2 diabetes mellitus with hyperglycemia: Secondary | ICD-10-CM | POA: Diagnosis not present

## 2015-08-13 DIAGNOSIS — E785 Hyperlipidemia, unspecified: Secondary | ICD-10-CM | POA: Diagnosis not present

## 2015-08-13 DIAGNOSIS — Z7982 Long term (current) use of aspirin: Secondary | ICD-10-CM | POA: Insufficient documentation

## 2015-08-13 DIAGNOSIS — I10 Essential (primary) hypertension: Secondary | ICD-10-CM | POA: Diagnosis not present

## 2015-08-13 DIAGNOSIS — I70245 Atherosclerosis of native arteries of left leg with ulceration of other part of foot: Secondary | ICD-10-CM

## 2015-08-13 DIAGNOSIS — L97529 Non-pressure chronic ulcer of other part of left foot with unspecified severity: Secondary | ICD-10-CM | POA: Insufficient documentation

## 2015-08-13 DIAGNOSIS — E211 Secondary hyperparathyroidism, not elsewhere classified: Secondary | ICD-10-CM | POA: Insufficient documentation

## 2015-08-13 HISTORY — PX: PERIPHERAL VASCULAR CATHETERIZATION: SHX172C

## 2015-08-13 LAB — POCT I-STAT, CHEM 8
BUN: 35 mg/dL — ABNORMAL HIGH (ref 6–20)
Calcium, Ion: 1.08 mmol/L — ABNORMAL LOW (ref 1.13–1.30)
Chloride: 96 mmol/L — ABNORMAL LOW (ref 101–111)
Creatinine, Ser: 4.9 mg/dL — ABNORMAL HIGH (ref 0.44–1.00)
Glucose, Bld: 259 mg/dL — ABNORMAL HIGH (ref 65–99)
HCT: 44 % (ref 36.0–46.0)
Hemoglobin: 15 g/dL (ref 12.0–15.0)
Potassium: 5.9 mmol/L — ABNORMAL HIGH (ref 3.5–5.1)
Sodium: 133 mmol/L — ABNORMAL LOW (ref 135–145)
TCO2: 30 mmol/L (ref 0–100)

## 2015-08-13 LAB — BASIC METABOLIC PANEL
Anion gap: 14 (ref 5–15)
BUN: 23 mg/dL — ABNORMAL HIGH (ref 6–20)
CO2: 26 mmol/L (ref 22–32)
Calcium: 9.4 mg/dL (ref 8.9–10.3)
Chloride: 96 mmol/L — ABNORMAL LOW (ref 101–111)
Creatinine, Ser: 5.33 mg/dL — ABNORMAL HIGH (ref 0.44–1.00)
GFR calc Af Amer: 9 mL/min — ABNORMAL LOW (ref >60)
GFR calc non Af Amer: 8 mL/min — ABNORMAL LOW (ref >60)
Glucose, Bld: 264 mg/dL — ABNORMAL HIGH (ref 65–99)
Potassium: 4.8 mmol/L (ref 3.5–5.1)
Sodium: 136 mmol/L (ref 135–145)

## 2015-08-13 LAB — GLUCOSE, CAPILLARY: Glucose-Capillary: 207 mg/dL — ABNORMAL HIGH (ref 65–99)

## 2015-08-13 SURGERY — ABDOMINAL AORTOGRAM
Anesthesia: LOCAL

## 2015-08-13 MED ORDER — METOPROLOL TARTRATE 1 MG/ML IV SOLN: 2.0000 mg | INTRAVENOUS | Status: DC | PRN

## 2015-08-13 MED ORDER — ACETAMINOPHEN 325 MG RE SUPP
325.0000 mg | RECTAL | Status: DC | PRN
  Filled 2015-08-13: qty 2

## 2015-08-13 MED ORDER — ONDANSETRON HCL 4 MG/2ML IJ SOLN: 4.0000 mg | Freq: Four times a day (QID) | INTRAMUSCULAR | Status: DC | PRN

## 2015-08-13 MED ORDER — LABETALOL HCL 5 MG/ML IV SOLN: 10.0000 mg | INTRAVENOUS | Status: DC | PRN

## 2015-08-13 MED ORDER — HYDRALAZINE HCL 20 MG/ML IJ SOLN
5.0000 mg | INTRAMUSCULAR | Status: DC | PRN
  Administered 2015-08-13: 5 mg via INTRAVENOUS

## 2015-08-13 MED ORDER — SODIUM CHLORIDE 0.9 % IV SOLN
INTRAVENOUS | Status: DC
  Administered 2015-08-13: 11:00:00 via INTRAVENOUS

## 2015-08-13 MED ORDER — HYDRALAZINE HCL 20 MG/ML IJ SOLN
INTRAMUSCULAR | Status: AC
  Filled 2015-08-13: qty 1

## 2015-08-13 MED ORDER — LIDOCAINE HCL (PF) 1 % IJ SOLN
INTRAMUSCULAR | Status: AC
  Filled 2015-08-13: qty 30

## 2015-08-13 MED ORDER — HEPARIN (PORCINE) IN NACL 2-0.9 UNIT/ML-% IJ SOLN
INTRAMUSCULAR | Status: AC
  Filled 2015-08-13: qty 1000

## 2015-08-13 MED ORDER — LIDOCAINE HCL (PF) 1 % IJ SOLN
INTRAMUSCULAR | Status: DC | PRN
  Administered 2015-08-13: 14 mL

## 2015-08-13 MED ORDER — MORPHINE SULFATE (PF) 10 MG/ML IV SOLN: 2.0000 mg | INTRAVENOUS | Status: DC | PRN

## 2015-08-13 MED ORDER — ACETAMINOPHEN 325 MG PO TABS
325.0000 mg | ORAL_TABLET | ORAL | Status: DC | PRN
  Filled 2015-08-13: qty 2

## 2015-08-13 SURGICAL SUPPLY — 9 items
CATH ANGIO 5F PIGTAIL 65CM (CATHETERS) ×2 IMPLANT
COVER PRB 48X5XTLSCP FOLD TPE (BAG) ×1 IMPLANT
COVER PROBE 5X48 (BAG) ×2
KIT PV (KITS) ×2 IMPLANT
SHEATH PINNACLE 5F 10CM (SHEATH) ×2 IMPLANT
SYR MEDRAD MARK V 150ML (SYRINGE) ×2 IMPLANT
TRANSDUCER W/STOPCOCK (MISCELLANEOUS) ×2 IMPLANT
TRAY PV CATH (CUSTOM PROCEDURE TRAY) ×2 IMPLANT
WIRE HI TORQ VERSACORE-J 145CM (WIRE) ×2 IMPLANT

## 2015-08-13 NOTE — H&P (View-Only) (Signed)
Referred by:  Biagio Borg, MD [address], [place] [address]  Reason for referral: left great toe ischemia  History of Present Illness  Carolyn Valenzuela is a 64 y.o. ([birth year]) female s/p R BKA who presents with chief complaint: poorly healing left great toe.  Onset of symptoms occurred several months ago.  In late Sept, she went to Podiatry and had debridement of her nails.  She has not healed up her great toe.  She also recently dropped something onto her left great toe.  Pain is described as aching, severity 1-5/10, and associated with manipulating her great toe.  Patient has attempted to treat this pain with rest and OTC pain medications.  The patient has no rest pain symptoms.  She denies any fever or chills.  She denies any drainage..  Atherosclerotic risk factors include: uncontrolled DM, HLD, HTN.  Past Medical History  Diagnosis Date  . DIABETES MELLITUS, UNCONTROLLED 05/21/2009  . HYPERLIPIDEMIA 03/30/2007  . ANEMIA-IRON DEFICIENCY 01/25/2008  . HYPERTENSION 01/25/2008  . SINUSITIS- ACUTE-NOS 01/25/2008  . SECONDARY HYPERPARATHYROIDISM 05/21/2009  . RENAL INSUFFICIENCY 02/01/2008  . CERVICAL RADICULOPATHY, LEFT 01/25/2008  . BACK PAIN 05/02/2009  . NUMBNESS 05/21/2009  . Allergic rhinitis, cause unspecified 12/08/2010  . Foot ulcer ([place])     right lateral malleolus  . Critical lower limb ischemia     Past Surgical History  Procedure Laterality Date  . Av fistula placement Left   . Eye surgery Bilateral     cataracts removed, left eye still has some oil in it.  . Colonoscopy    . Amputation Right 03/15/2014    Procedure: RIGHT  LEG  BELOW KNEE AMPUTATION ;  Surgeon: [name], MD;  Location: [facility];  Service: Orthopedics;  Laterality: Right;  . Insertion of dialysis catheter N/A 09/03/2014    Procedure: INSERTION OF DIALYSIS CATHETER RIGHT INTERNAL JUGULAR VEIN;  Surgeon: Conrad [place], MD;  Location: [facility];  Service: Vascular;  Laterality: N/A;  . Av fistula  placement Left 09/06/2014    Procedure: ARTERIOVENOUS (AV) FISTULA CREATION-Left Brachiocephalic;  Surgeon: Conrad Questa, MD;  Location: [facility];  Service: Vascular;  Laterality: Left;    Social History   Social History  . Marital Status: Single    Spouse Name: N/A  . Number of Children: N/A  . Years of Education: N/A   Occupational History  . [place] History Main Topics  . Smoking status: Never Smoker   . Smokeless tobacco: Never Used  . Alcohol Use: No  . Drug Use: No  . Sexual Activity: Not on file   Other Topics Concern  . Not on file   Social History Narrative     Family History  Problem Relation Age of Onset  . Cancer Mother     Pancreatic    Current Outpatient Prescriptions  Medication Sig Dispense Refill  . amLODipine (NORVASC) 10 MG tablet TAKE 1 TABLET BY MOUTH DAILY 30 tablet 5  . aspirin 81 MG EC tablet Take 81 mg by mouth daily.      . Blood Glucose Monitoring Suppl (ONETOUCH VERIO) W/DEVICE KIT 1 each by Does not apply route 2 (two) times daily. 1 kit 0  . cloNIDine (CATAPRES) 0.1 MG tablet Take 1 tablet (0.1 mg total) by mouth 2 (two) times daily. 60 tablet 0  . clotrimazole-betamethasone (LOTRISONE) cream Use as directed to affected area twice per day as needed 45 g  1  . FOSRENOL 1000 MG chewable tablet Chew 1 tablet by mouth 3 (three) times daily.    . furosemide (LASIX) 40 MG tablet TAKE 2 TABLETS BY MOUTH EVERY MORNING AND TAKE 1 TABLET EVERY EVENING 90 tablet 3  . gabapentin (NEURONTIN) 100 MG capsule Take 1 capsule (100 mg total) by mouth at bedtime. 30 capsule 0  . glipiZIDE (GLUCOTROL XL) 5 MG 24 hr tablet Take 1 tablet (5 mg total) by mouth daily with breakfast. 90 tablet 3  . glucose blood (ONETOUCH VERIO) test strip 1 each by Other route 2 (two) times daily. Use as instructed 100 each 11  . Multiple Vitamin (MULTIVITAMIN WITH MINERALS) TABS tablet Take 1 tablet by mouth daily.    . simvastatin (ZOCOR) 20 MG  tablet Take 1 tablet (20 mg total) by mouth daily at 6 PM. 90 tablet 3  . acetaminophen (TYLENOL) 325 MG tablet Take 2 tablets (650 mg total) by mouth every 6 (six) hours as needed for mild pain, moderate pain or fever.    . cephALEXin (KEFLEX) 500 MG capsule Take 1 capsule (500 mg total) by mouth 3 (three) times daily. 30 capsule 0   No current facility-administered medications for this visit.    Allergies  Allergen Reactions  . Oxycodone Itching     REVIEW OF SYSTEMS:  (Positives checked otherwise negative)  CARDIOVASCULAR:   '[ ]'  chest pain,  '[ ]'  chest pressure,  '[ ]'  palpitations,  '[ ]'  shortness of breath when laying flat,  '[ ]'  shortness of breath with exertion,   '[ ]'  pain in feet when walking,  '[x]'  pain in feet when laying flat, '[ ]'  history of blood clot in veins (DVT),  '[ ]'  history of phlebitis,  '[ ]'  swelling in legs,  '[ ]'  varicose veins  PULMONARY:   '[ ]'  productive cough,  '[ ]'  asthma,  '[ ]'  wheezing  NEUROLOGIC:   '[ ]'  weakness in arms or legs,  '[x]'  numbness in arms or legs,  '[ ]'  difficulty speaking or slurred speech,  '[ ]'  temporary loss of vision in one eye,  '[ ]'  dizziness  HEMATOLOGIC:   '[ ]'  bleeding problems,  '[ ]'  problems with blood clotting too easily  MUSCULOSKEL:   '[ ]'  joint pain, '[ ]'  joint swelling  GASTROINTEST:   '[ ]'  vomiting blood,  '[ ]'  blood in stool     GENITOURINARY:   '[ ]'  burning with urination,  '[ ]'  blood in urine  PSYCHIATRIC:   '[ ]'  history of major depression  INTEGUMENTARY:   '[ ]'  rashes,  '[ ]'  ulcers  CONSTITUTIONAL:   '[ ]'  fever,  '[ ]'  chills   For VQI Use Only  PRE-ADM LIVING: Home  AMB STATUS: Ambulatory  CAD Sx: None  PRIOR CHF: None  STRESS TEST: '[x]'  No, '[ ]'  Normal, '[ ]'  + ischemia, '[ ]'  + MI, '[ ]'  Both   Physical Examination  Filed Vitals:   08/09/15 1017 08/09/15 1024  BP: 165/74 165/76  Pulse: 72 70  Temp: 97.7 F (36.5 C)   TempSrc: Oral   Resp: 16   Height: 4' 11.5" (1.511 m)   Weight: 123 lb (55.792  kg)   SpO2: 100%    Body mass index is 24.44 kg/(m^2).  General: A&O x 3, WD, thin  Head: [place]/AT, Temporalis wasting,   Ear/Nose/Throat: Hearing grossly intact, nares w/o erythema or drainage, oropharynx w/o Erythema/Exudate, Mallampati score: 3  Eyes: PERRLA, EOMI  Neck: Supple, no  nuchal rigidity, no palpable LAD  Pulmonary: Sym exp, good air movt, CTAB, no rales, rhonchi, & wheezing  Cardiac: RRR, Nl S1, S2, no Murmurs, rubs or gallops  Vascular: Vessel Right Left  Radial Palpable Palpable  Ulnar Not Palpable Not Palpable  Brachial Palpable Palpable  Carotid Palpable, without bruit Palpable, without bruit  Aorta Not palpable N/A  Femoral Palpable Palpable  Popliteal Not palpable Not palpable  PT BKA Not Palpable  DP BKA Not Palpable   Gastrointestinal: soft, NTND, -G/R, - HSM, - masses, - CVAT B  Musculoskeletal: M/S 5/5 throughout , R BKA with prosthesis, L foot with dependent rubor, ischemic appearing great toe extending to metatarsal, injuried appeared distal 1st great phalange, decreased sensation in left foot; L upper arm fistula with thrill and bruit, bruit consistent with likely distal stenosis  Neurologic: CN 2-12 intact , Pain and light touch intact in extremities except decreased in L foot, Motor exam as listed above  Psychiatric: Judgment intact, Mood & affect appropriate for pt's clinical situation  Dermatologic: See M/S exam for extremity exam, no rashes otherwise noted  Lymph : No Cervical, Axillary, or Inguinal lymphadenopathy    Non-Invasive Vascular Imaging  ABI (Date: 08/09/2015)  L: 0.92, DP: mono, PT: mono, TBI: ND   Medical Decision Making  Carolyn Valenzuela is a 64 y.o. female who presents with: BLE critical limb ischemia, s/p R BKA, possible cellulitis L foot, likely stenosis segment in L BC AVF   Obviously this patient is at high risk for limb loss and she is aware of such.  Prescribed Keflex 500 mg 1 po tid x 10 day for presumed  cellultis.  I suspect her ABI are falsely elevated and this patient has significant calcific atherosclerosis on multiple levels.  I discussed with the patient the natural history of critical limb ischemia: 25% require amputation in one year, 50% are able to maintain their limbs in one year, and 25-30% die in one year due to comorbidities.  Given the limb threatening status of this patient, I recommend an aggressive work up including proceeding with an: Aortogram, Left runoff and possible intervention. I discussed with the patient the nature of angiographic procedures, especially the limited patencies of any endovascular intervention. The patient is aware of that the risks of an angiographic procedure include but are not limited to: bleeding, infection, access site complications, embolization, rupture of treated vessel, dissection, possible need for emergent surgical intervention, and possible need for surgical procedures to treat the patient's pathology. The patient is aware of the risks and agrees to proceed.  The procedure is scheduled for: 13 DEC 16.  I discussed in depth with the patient the nature of atherosclerosis, and emphasized the importance of maximal medical management including strict control of blood pressure, blood glucose, and lipid levels, antiplatelet agents, obtaining regular exercise, and cessation of smoking.  The patient is aware that without maximal medical management the underlying atherosclerotic disease process will progress, limiting the benefit of any interventions. The patient is not currently on a statin: as reportedly not indicated. The patient is currently on an anti-platelet: ASA.  In regards to the venous stenosis in the L [facility] AVF, he is already scheduled for a outpatient fistulogram with IN.  Thank you for allowing [place] to participate in this patient's care.   Adele Barthel, MD [facility] [place] Office: [phone] Pager:  [phone]  08/09/2015, 10:59 AM

## 2015-08-13 NOTE — Progress Notes (Signed)
60fr sheath aspirated and removed from rfa. Manual pressure applied for 25 minutes. Tegaderm dressing applied, bedrest instructions given.  No S+S of hematoma groin level 0.    Right popliteal pulse palpable.  Bedrest begins at 13:30:00

## 2015-08-13 NOTE — Op Note (Signed)
Procedure: Aortogram with bilateral lower extremity runoff     Preoperative diagnosis: Nonhealing wound left first toe  Postoperative diagnosis: Same  Anesthesia: Local  Operative findings: #1 patent left common femoral profunda femoris superficial femoral-popliteal arteries #2 severe tibial artery occlusive disease with occlusion of peroneal and posterior tibial arteries and occlusion of the distal anterior tibial artery with filling of a collateral which fills the distal peroneal artery no flow noted across the ankle  Operative details: After obtaining informed consent, the patient was taken to the [facility] [place] lab. The patient was placed in supine position the Angio table. Both groins were prepped and draped in usual sterile fashion. Ultrasound was used to identify the right common femoral artery. Local anesthesia was infiltrated over this. An introducer needle was then used to cannulate the right common femoral artery without difficulty. An 035 versacore wire was threaded up the abdominal aorta under fluoroscopic guidance. A 5 French sheath was placed over the guidewire and the right common femoral artery. This was thoroughly flushed with heparinized saline. A 5 French pig catheter was then placed over the guidewire into the abdominal aorta and abdominal aortogram was obtained in AP projection. The infrarenal abdominal aorta is patent. The left or right renal arteries are patent. The left and right common external and internal iliac arteries are patent with no flow limiting stenosis.  At this point the pectoral catheter was pulled down just above the aortic bifurcation and bilateral lower extremity runoff views were obtained.  In the right lower extremity, the right common femoral profunda femoris and superficial femoral arteries are patent. The patient has an existing right below-knee amputation.  In the left lower extremity, the left common femoral artery is patent. The left superficial femoral and  profunda femoris arteries are calcified but patent. The left popliteal artery is patent. The tibioperoneal trunk peroneal and posterior tibial arteries are all. The anterior tibial artery is patent at its origin although diffusely diseased no focal flow-limiting stenosis. The anterior tibial artery appears to occlude in the distal leg and then via collaterals the distal peroneal artery fills from this. There is no named vessel it crosses the ankle.  At this point the pigtail catheter was removed over a guidewire. A 5 French sheath was thoroughly flushed with heparinized saline. The patient tolerated the procedure well and there were, case. The patient was taken to the holding area in stable condition.  Operative management: The patient will be scheduled to see Dr. Bridgett Larsson in follow-up to discuss options for her toe although revascularization options appear to be limited.  Carolyn Hinds, MD [facility] Office: [phone] Pager: [phone]

## 2015-08-13 NOTE — Discharge Instructions (Signed)

## 2015-08-13 NOTE — Interval H&P Note (Signed)
History and Physical Interval Note:  08/13/2015 12:16 PM  [name]  has presented today for surgery, with the diagnosis of left great toe ischemia  The various methods of treatment have been discussed with the patient and family. After consideration of risks, benefits and other options for treatment, the patient has consented to  Procedure(s): Abdominal Aortogram (N/A) as a surgical intervention .  The patient's history has been reviewed, patient examined, no change in status, stable for surgery.  I have reviewed the patient's chart and labs.  Questions were answered to the patient's satisfaction.     Ruta Hinds

## 2015-08-14 ENCOUNTER — Encounter: Payer: Self-pay | Admitting: Vascular Surgery

## 2015-08-14 ENCOUNTER — Telehealth: Payer: Self-pay | Admitting: Vascular Surgery

## 2015-08-14 NOTE — Telephone Encounter (Signed)
-----   Message from Denman George, RN sent at 08/14/2015 11:36 AM EST ----- Regarding: RE: needs f/u with Dr. Bridgett Larsson next week to discuss surgical options Yes, if we can add her on. Thanks.     ----- Message -----    From: Gena Fray    Sent: 08/14/2015  11:27 AM      To: Denman George, RN Subject: RE: needs f/u with Dr. Bridgett Larsson next week to dis#  Dr Bridgett Larsson is out next week and on-call the following week. Should we try to bring this patient in this week instead?  Hinton Dyer ----- Message -----    From: Denman George, RN    Sent: 08/13/2015   3:17 PM      To: [facility] Admin Pool Subject: needs f/u with Dr. Bridgett Larsson next week to discuss#    ----- Message -----    From: Elam Dutch, MD    Sent: 08/13/2015   2:11 PM      To: Vvs Charge Pool  Aortogram with bilateral runoff. [place] groin. Pt needs follow up with Dr Bridgett Larsson next week to discuss options   Ruta Hinds, MD [facility] Office: [phone] Pager: [phone]

## 2015-08-14 NOTE — Telephone Encounter (Signed)
Unable to reach pt by phone- sent letter (priority) to home address, dpm

## 2015-08-15 ENCOUNTER — Telehealth: Payer: Self-pay | Admitting: Vascular Surgery

## 2015-08-15 NOTE — Telephone Encounter (Signed)
-----   Message from Denman George, RN sent at 08/13/2015  3:17 PM EST ----- Regarding: needs f/u with Dr. Bridgett Larsson next week to discuss surgical options   ----- Message -----    From: Elam Dutch, MD    Sent: 08/13/2015   2:11 PM      To: Vvs Charge Pool  Aortogram with bilateral runoff. [place] groin. Pt needs follow up with Dr Bridgett Larsson next week to discuss options   Ruta Hinds, MD [facility] Office: [phone] Pager: [phone]

## 2015-08-15 NOTE — Telephone Encounter (Signed)
Spoke with pt, dpm °

## 2015-08-16 ENCOUNTER — Telehealth: Payer: Self-pay | Admitting: Vascular Surgery

## 2015-08-16 ENCOUNTER — Ambulatory Visit (INDEPENDENT_AMBULATORY_CARE_PROVIDER_SITE_OTHER): Payer: BC Managed Care – PPO | Admitting: Vascular Surgery

## 2015-08-16 ENCOUNTER — Encounter: Payer: Self-pay | Admitting: Vascular Surgery

## 2015-08-16 VITALS — BP 142/59 | HR 75 | Temp 98.3°F | Resp 14 | Ht 59.0 in | Wt 125.0 lb

## 2015-08-16 DIAGNOSIS — I998 Other disorder of circulatory system: Secondary | ICD-10-CM | POA: Diagnosis not present

## 2015-08-16 DIAGNOSIS — I70229 Atherosclerosis of native arteries of extremities with rest pain, unspecified extremity: Secondary | ICD-10-CM

## 2015-08-16 MED ORDER — TRAMADOL HCL 50 MG PO TABS: 50.0000 mg | ORAL_TABLET | Freq: Four times a day (QID) | ORAL | Status: DC | PRN

## 2015-08-16 NOTE — Progress Notes (Signed)
    Postoperative Visit   History of Present Illness  Carolyn Valenzuela is a 64 y.o. female who presents for postoperative follow-up from procedure on Date: 08/13/15: Ao, BRo .  The patient's wounds are not healed.  The left great toe has sloughed several layers of skin and now is black.  The patient is able to complete their activities of daily living.  The patient's current symptoms are: pain in L foot.  Past Medical History, Past Surgical History, Social History, Family History, Medications, Allergies, and Review of Systems are unchanged from previous evaluation on 08/13/15.  On ROS: no rest pain, no drainage from L great toe  For VQI Use Only  PRE-ADM LIVING: Home  AMB STATUS: Ambulatory  Physical Examination  Filed Vitals:   08/16/15 1005 08/16/15 1008  BP: 146/57 142/59  Pulse: 79 75  Temp: 98.3 F (36.8 C)   Resp: 14   Height: 4\' 11"  (1.499 m)   Weight: 125 lb (56.7 kg)   SpO2: 100%    Body mass index is 25.23 kg/(m^2).  General: A&O x 3, WDWN  Pulmonary: Sym exp, good air movt, CTAB, no rales, rhonchi, & wheezing  Cardiac: RRR, Nl S1, S2, no Murmurs, rubs or gallops  Vascular: R BKA, L foot without palpable pulses  Gastrointestinal: soft, NTND, -G/R, - HSM, - masses, - CVAT B  Musculoskeletal: M/S 5/5 throughout R BKA, L distal phalange 1st toe appears dead with black appearance, R groin without hematoma, no echymosis present at cannulation site  Neurologic:  Pain and light touch intact in extremities except R BKA and L foot decreased sensation, Motor exam as listed above  Medical Decision Making  Carolyn Valenzuela is a 64 y.o. female who presents s/p Ao, BRo with digital artery disease in L foot . Based on his angiographic findings, the patient has intact anterior tibial artery flow to ankle level with only collateral flow distally into the foot.  I don't see formal tarsal branches into the left foot  I will refer her to Dr. Sharol Given for further management of  the ischemic digit.  Minimally she will need a L great toe amputation.   If that fails to heal, she may need a L BKA.  Thank you for allowing [place] to participate in this patient's care.  Adele Barthel, MD [facility] Office: [phone] Pager: [phone]

## 2015-08-16 NOTE — Telephone Encounter (Signed)
[facility] appointment info: Dr. Sharol Given [phone]  Monday 08/19/15 at [place].

## 2015-08-19 NOTE — Addendum Note (Signed)
Addended by: Dorthula Rue L on: 08/19/2015 02:22 PM   Modules accepted: Orders

## 2015-08-21 ENCOUNTER — Other Ambulatory Visit: Payer: Self-pay | Admitting: Internal Medicine

## 2015-08-23 ENCOUNTER — Telehealth: Payer: Self-pay | Admitting: *Deleted

## 2015-08-23 NOTE — Telephone Encounter (Signed)
Pt should take otc such as immodium or keopectate prn  O/w would need to be seen to determine if other medication might be appropriate, if this is newly worsening problem recently

## 2015-08-23 NOTE — Telephone Encounter (Signed)
Notified pt with md response.../lmb 

## 2015-08-23 NOTE — Telephone Encounter (Signed)
Pt left msg on triage stating she has been having diarrhea since Monday. Requesting md to rx something for sxs...Johny Chess

## 2015-08-27 ENCOUNTER — Ambulatory Visit: Payer: BC Managed Care – PPO | Admitting: Internal Medicine

## 2015-09-16 ENCOUNTER — Other Ambulatory Visit: Payer: Self-pay | Admitting: Internal Medicine

## 2015-09-17 ENCOUNTER — Ambulatory Visit: Payer: BC Managed Care – PPO | Admitting: Cardiovascular Disease

## 2015-09-19 ENCOUNTER — Other Ambulatory Visit: Payer: Self-pay | Admitting: Internal Medicine

## 2015-10-22 ENCOUNTER — Ambulatory Visit: Payer: BC Managed Care – PPO | Admitting: Cardiovascular Disease

## 2015-11-12 ENCOUNTER — Ambulatory Visit (INDEPENDENT_AMBULATORY_CARE_PROVIDER_SITE_OTHER): Payer: BC Managed Care – PPO | Admitting: Physician Assistant

## 2015-11-12 ENCOUNTER — Encounter: Payer: Self-pay | Admitting: Physician Assistant

## 2015-11-12 VITALS — BP 130/60 | HR 78 | Ht 59.0 in | Wt 121.2 lb

## 2015-11-12 DIAGNOSIS — I10 Essential (primary) hypertension: Secondary | ICD-10-CM | POA: Diagnosis not present

## 2015-11-12 DIAGNOSIS — R0789 Other chest pain: Secondary | ICD-10-CM | POA: Diagnosis not present

## 2015-11-12 DIAGNOSIS — R1310 Dysphagia, unspecified: Secondary | ICD-10-CM | POA: Insufficient documentation

## 2015-11-12 NOTE — Patient Instructions (Signed)
Your physician recommends that you continue on your current medications as directed. Please refer to the Current Medication list given to you today.  Tarri Fuller, PA-C, recommends that you schedule a follow-up appointment in 6 months with Dr Gwenlyn Found. You will receive a reminder letter in the mail two months in advance. If you don't receive a letter, please call our office to schedule the follow-up appointment.  If you need a refill on your cardiac medications before your next appointment, please call your pharmacy.  **Please make an appointment with your primary care doctor, Dr Jenny Reichmann, about pain with swallowing.

## 2015-11-12 NOTE — Progress Notes (Signed)
Patient ID: Carolyn Valenzuela, female   DOB: 1951/08/07, 65 y.o.   MRN: [id number]    Date:  11/12/2015   ID:  Carolyn Valenzuela, DOB 10/04/1950, MRN [id number]  PCP:  Cathlean Cower, MD  Primary Cardiologist:  Gwenlyn Found  Chief Complaint  Patient presents with  . Advice Only    patient reports having chest discomfort. Associates this with eating and swallowing.     History of Present Illness: Carolyn Valenzuela is a 65 y.o. female with h/o type 2 diabetes, hyperlipidemia, Hypertension, chronic kidney disease stage 4, not on dialysis. The patient was seen in May 2015 for evaluation of peripheral vascular disease. She has never smoked. She denies any history of heart disease and any family history of ischemic heart disease. Lower extremity arterial Doppler studies did not show significant obstructive disease however, she has diminished TBIs bilaterally suggesting the possibility of poor healing as a result of a surgical procedure. The patient ultimately underwent a right BKA in July 2015. She now is getting hemodialysis Monday Wednesday Friday.   Carolyn Valenzuela presents with complaints of chest pain when she is eating. When she swallows she develops pain.  She also feels it when swallows pills.  She been ongoing for about 2 months.  She is not vomiting up any food.  The patient currently denies nausea, vomiting, fever, shortness of breath, orthopnea, dizziness, PND, cough, congestion, abdominal pain, hematochezia, melena, lower extremity edema, claudication.  Wt Readings from Last 3 Encounters:  11/12/15 121 lb 3.2 oz (54.976 kg)  08/16/15 125 lb (56.7 kg)  08/13/15 123 lb (55.792 kg)     Past Medical History  Diagnosis Date  . DIABETES MELLITUS, UNCONTROLLED 05/21/2009  . HYPERLIPIDEMIA 03/30/2007  . ANEMIA-IRON DEFICIENCY 01/25/2008  . HYPERTENSION 01/25/2008  . SINUSITIS- ACUTE-NOS 01/25/2008  . SECONDARY HYPERPARATHYROIDISM 05/21/2009  . RENAL INSUFFICIENCY 02/01/2008  . CERVICAL RADICULOPATHY, LEFT  01/25/2008  . BACK PAIN 05/02/2009  . NUMBNESS 05/21/2009  . Allergic rhinitis, cause unspecified 12/08/2010  . Foot ulcer ([place])     right lateral malleolus  . Critical lower limb ischemia     Current Outpatient Prescriptions  Medication Sig Dispense Refill  . acetaminophen (TYLENOL) 325 MG tablet Take 2 tablets (650 mg total) by mouth every 6 (six) hours as needed for mild pain, moderate pain or fever.    [name] amLODipine (NORVASC) 10 MG tablet TAKE 1 TABLET BY MOUTH EVERY DAY 30 tablet 5  . aspirin 81 MG EC tablet Take 81 mg by mouth daily.      . Blood Glucose Monitoring Suppl (ONETOUCH VERIO) W/DEVICE KIT 1 each by Does not apply route 2 (two) times daily. 1 kit 0  . cloNIDine (CATAPRES) 0.1 MG tablet TAKE 1 TABLET BY MOUTH TWICE A DAY 60 tablet 5  . furosemide (LASIX) 40 MG tablet TAKE 2 TABLETS BY MOUTH EVERY MORNING AND TAKE 1 TABLET EVERY EVENING 90 tablet 3  . gabapentin (NEURONTIN) 100 MG capsule Take 1 capsule by mouth 2 (two) times daily.  3  . glipiZIDE (GLUCOTROL XL) 5 MG 24 hr tablet Take 1 tablet (5 mg total) by mouth daily with breakfast. 90 tablet 3  . glucose blood (ONETOUCH VERIO) test strip 1 each by Other route 2 (two) times daily. Use as instructed 100 each 11  . HYDROcodone-acetaminophen (NORCO/VICODIN) 5-325 MG tablet TAKE 1 TABLET 2 TO 3 TIMES A DAY AS NEEDED FOR PAIN  0  . Multiple Vitamin (MULTIVITAMIN WITH MINERALS) TABS tablet Take 1 tablet  by mouth daily.    . nitroGLYCERIN (NITRODUR - DOSED IN MG/24 HR) 0.2 mg/hr patch APPLY 1 PATCH DAILY. REMOVE OLD PATCH DAILY. CHANGE PATCH LOCATION DAILY.  3  . nystatin (MYCOSTATIN) 100000 UNIT/ML suspension Take 5 mLs by mouth 4 (four) times daily.  0  . simvastatin (ZOCOR) 20 MG tablet Take 1 tablet (20 mg total) by mouth daily at 6 PM. 90 tablet 3   No current facility-administered medications for this visit.    Allergies:    Allergies  Allergen Reactions  . Oxycodone Itching    Social History:  The patient  reports that  she has never smoked. She has never used smokeless tobacco. She reports that she does not drink alcohol or use illicit drugs.   Family history:   Family History  Problem Relation Age of Onset  . Cancer Mother     Pancreatic    ROS:  Please see the history of present illness.  All other systems reviewed and negative.   PHYSICAL EXAM: VS:  BP 130/60 mmHg  Pulse 78  Ht '4\' 11"'  (1.499 m)  Wt 121 lb 3.2 oz (54.976 kg)  BMI 24.47 kg/m2 Well nourished, well developed, in no acute distress HEENT: Pupils are equal round react to light accommodation extraocular movements are intact.  Neck: no JVDNo cervical lymphadenopathy. Cardiac: Regular rate and rhythm without murmurs rubs or gallops. Lungs:  clear to auscultation bilaterally, no wheezing, rhonchi or rales Abd: soft, nontender, positive bowel sounds all quadrants, no hepatosplenomegaly Ext: no lower extremity edema.  2+ radial and 2+ left  dorsalis pedis pulses. Skin: warm and dry Neuro:  Grossly normal  EKG:  Normal sinus rhythm rate 70 bpm  ASSESSMENT AND PLAN:  Problem List Items Addressed This Visit    Odynophagia   Essential hypertension   Relevant Medications   nitroGLYCERIN (NITRODUR - DOSED IN MG/24 HR) 0.2 mg/hr patch   Chest pain, atypical    Other Visit Diagnoses    Chest discomfort    -  Primary    Relevant Orders    EKG 12-Lead       Patient has odynophagia secondary to unknown etiology. Perhaps esophageal candidiasis or possible stricture. I recommended she follow-up with her primary care provider as this is been ongoing now for 2 months. It is clearly not related to her heart, which is what she was concerned of.

## 2015-11-25 ENCOUNTER — Encounter (HOSPITAL_COMMUNITY): Payer: Self-pay | Admitting: Emergency Medicine

## 2015-11-25 ENCOUNTER — Emergency Department (HOSPITAL_COMMUNITY)
Admission: EM | Admit: 2015-11-25 | Discharge: 2015-11-25 | Disposition: A | Discharge status: Home / Self Care | Payer: BC Managed Care – PPO | Attending: Emergency Medicine | Admitting: Emergency Medicine

## 2015-11-25 ENCOUNTER — Emergency Department (HOSPITAL_COMMUNITY): Payer: BC Managed Care – PPO

## 2015-11-25 DIAGNOSIS — I96 Gangrene, not elsewhere classified: Secondary | ICD-10-CM | POA: Diagnosis not present

## 2015-11-25 DIAGNOSIS — E785 Hyperlipidemia, unspecified: Secondary | ICD-10-CM | POA: Diagnosis not present

## 2015-11-25 DIAGNOSIS — Z7982 Long term (current) use of aspirin: Secondary | ICD-10-CM | POA: Diagnosis not present

## 2015-11-25 DIAGNOSIS — Z862 Personal history of diseases of the blood and blood-forming organs and certain disorders involving the immune mechanism: Secondary | ICD-10-CM | POA: Diagnosis not present

## 2015-11-25 DIAGNOSIS — Z7984 Long term (current) use of oral hypoglycemic drugs: Secondary | ICD-10-CM | POA: Insufficient documentation

## 2015-11-25 DIAGNOSIS — Z992 Dependence on renal dialysis: Secondary | ICD-10-CM | POA: Insufficient documentation

## 2015-11-25 DIAGNOSIS — E119 Type 2 diabetes mellitus without complications: Secondary | ICD-10-CM | POA: Diagnosis not present

## 2015-11-25 DIAGNOSIS — N186 End stage renal disease: Secondary | ICD-10-CM | POA: Insufficient documentation

## 2015-11-25 DIAGNOSIS — Z8709 Personal history of other diseases of the respiratory system: Secondary | ICD-10-CM | POA: Insufficient documentation

## 2015-11-25 DIAGNOSIS — I120 Hypertensive chronic kidney disease with stage 5 chronic kidney disease or end stage renal disease: Secondary | ICD-10-CM | POA: Diagnosis not present

## 2015-11-25 DIAGNOSIS — Z79899 Other long term (current) drug therapy: Secondary | ICD-10-CM | POA: Insufficient documentation

## 2015-11-25 DIAGNOSIS — M79675 Pain in left toe(s): Secondary | ICD-10-CM | POA: Diagnosis present

## 2015-11-25 DIAGNOSIS — R1110 Vomiting, unspecified: Secondary | ICD-10-CM | POA: Insufficient documentation

## 2015-11-25 LAB — CBC WITH DIFFERENTIAL/PLATELET
Basophils Absolute: 0 10*3/uL (ref 0.0–0.1)
Basophils Relative: 0 %
Eosinophils Absolute: 0 10*3/uL (ref 0.0–0.7)
Eosinophils Relative: 0 %
HCT: 32.1 % — ABNORMAL LOW (ref 36.0–46.0)
Hemoglobin: 10.3 g/dL — ABNORMAL LOW (ref 12.0–15.0)
Lymphocytes Relative: 17 %
Lymphs Abs: 1.6 10*3/uL (ref 0.7–4.0)
MCH: 30.2 pg (ref 26.0–34.0)
MCHC: 32.1 g/dL (ref 30.0–36.0)
MCV: 94.1 fL (ref 78.0–100.0)
Monocytes Absolute: 0.7 10*3/uL (ref 0.1–1.0)
Monocytes Relative: 7 %
Neutro Abs: 7.2 10*3/uL (ref 1.7–7.7)
Neutrophils Relative %: 75 %
Platelets: 366 10*3/uL (ref 150–400)
RBC: 3.41 MIL/uL — ABNORMAL LOW (ref 3.87–5.11)
RDW: 17.1 % — ABNORMAL HIGH (ref 11.5–15.5)
WBC: 10 10*3/uL (ref 4.0–10.5)

## 2015-11-25 LAB — BASIC METABOLIC PANEL
Anion gap: 16 — ABNORMAL HIGH (ref 5–15)
BUN: 16 mg/dL (ref 6–20)
CO2: 27 mmol/L (ref 22–32)
Calcium: 9.4 mg/dL (ref 8.9–10.3)
Chloride: 95 mmol/L — ABNORMAL LOW (ref 101–111)
Creatinine, Ser: 3.43 mg/dL — ABNORMAL HIGH (ref 0.44–1.00)
GFR calc Af Amer: 15 mL/min — ABNORMAL LOW (ref >60)
GFR calc non Af Amer: 13 mL/min — ABNORMAL LOW (ref >60)
Glucose, Bld: 186 mg/dL — ABNORMAL HIGH (ref 65–99)
Potassium: 4.9 mmol/L (ref 3.5–5.1)
Sodium: 138 mmol/L (ref 135–145)

## 2015-11-25 LAB — C-REACTIVE PROTEIN: CRP: 13 mg/dL — ABNORMAL HIGH (ref <1.0)

## 2015-11-25 LAB — SEDIMENTATION RATE: Sed Rate: 115 mm/hr — ABNORMAL HIGH (ref 0–22)

## 2015-11-25 MED ORDER — HYDROMORPHONE HCL 1 MG/ML IJ SOLN
1.0000 mg | Freq: Once | INTRAMUSCULAR | Status: AC
  Administered 2015-11-25: 1 mg via INTRAVENOUS
  Filled 2015-11-25: qty 1

## 2015-11-25 MED ORDER — HYDROMORPHONE HCL 2 MG PO TABS: 2.0000 mg | ORAL_TABLET | Freq: Four times a day (QID) | ORAL | Status: DC | PRN

## 2015-11-25 NOTE — ED Provider Notes (Signed)
CSN: [id number]     Arrival date & time 11/25/15  1914 History   First MD Initiated Contact with Patient 11/25/15 1930     Chief Complaint  Patient presents with  . Foot Pain      HPI  65 year old female with history of poorly controlled type 2 diabetes. End stage renal disease on dialysis Monday Wednesday Friday, status post BKA on the right and chronic necrotic left great toe, who presents with worsening pain to her left great toe. Patient reports that the toe initially began turning black approximately 3 months ago. She has been seeing Dr. Sharol Given with orthopedic surgery. She reports that the pain is now 10 out of 10 and keeps her from walking 2/2 pain. She denies fevers or chills. Denies additional symptoms with the exception of 1 episode of vomiting this morning "when the pain was at its most severe." Has been going to her dialysis as scheduled. No red streaking.  Past Medical History  Diagnosis Date  . DIABETES MELLITUS, UNCONTROLLED 05/21/2009  . HYPERLIPIDEMIA 03/30/2007  . ANEMIA-IRON DEFICIENCY 01/25/2008  . HYPERTENSION 01/25/2008  . SINUSITIS- ACUTE-NOS 01/25/2008  . SECONDARY HYPERPARATHYROIDISM 05/21/2009  . RENAL INSUFFICIENCY 02/01/2008  . CERVICAL RADICULOPATHY, LEFT 01/25/2008  . BACK PAIN 05/02/2009  . NUMBNESS 05/21/2009  . Allergic rhinitis, cause unspecified 12/08/2010  . Foot ulcer ([place])     right lateral malleolus  . Critical lower limb ischemia    Past Surgical History  Procedure Laterality Date  . Av fistula placement Left   . Eye surgery Bilateral     cataracts removed, left eye still has some oil in it.  . Colonoscopy    . Amputation Right 03/15/2014    Procedure: RIGHT  LEG  BELOW KNEE AMPUTATION ;  Surgeon: [name], MD;  Location: [facility];  Service: Orthopedics;  Laterality: Right;  . Insertion of dialysis catheter N/A 09/03/2014    Procedure: INSERTION OF DIALYSIS CATHETER RIGHT INTERNAL JUGULAR VEIN;  Surgeon: Conrad Peb[place], MD;  Location: [facility];  Service: Vascular;   Laterality: N/A;  . Av fistula placement Left 09/06/2014    Procedure: ARTERIOVENOUS (AV) FISTULA CREATION-Left Brachiocephalic;  Surgeon: Conrad Atqasuk, MD;  Location: [facility];  Service: Vascular;  Laterality: Left;  . Peripheral vascular catheterization N/A 08/13/2015    Procedure: Abdominal Aortogram;  Surgeon: Elam Dutch, MD;  Location: [facility];  Service: Cardiovascular;  Laterality: N/A;   Family History  Problem Relation Age of Onset  . Cancer Mother     Pancreatic   Social History  Substance Use Topics  . Smoking status: Never Smoker   . Smokeless tobacco: Never Used  . Alcohol Use: No   OB History    No data available     Review of Systems  Constitutional: Negative for fever, chills, activity change and appetite change.  HENT: Negative for congestion, rhinorrhea and sore throat.   Eyes: Negative for visual disturbance.  Respiratory: Negative for cough, shortness of breath and wheezing.   Cardiovascular: Negative for chest pain and palpitations.  Gastrointestinal: Positive for vomiting. Negative for nausea, abdominal pain, diarrhea and abdominal distention.  Genitourinary: Negative for flank pain.  Musculoskeletal: Positive for arthralgias (L great toe). Negative for myalgias, back pain, joint swelling, neck pain and neck stiffness.  Skin: Negative for rash.  Neurological: Negative for dizziness, tremors, syncope, facial asymmetry, speech difficulty, weakness, numbness and headaches.  Psychiatric/Behavioral: Negative for behavioral problems, confusion and agitation.      Allergies  Oxycodone  Home Medications   Prior to Admission medications   Medication Sig Start Date End Date Taking? Authorizing Provider  acetaminophen (TYLENOL) 325 MG tablet Take 2 tablets (650 mg total) by mouth every 6 (six) hours as needed for mild pain, moderate pain or fever. 09/13/14  Yes Modena Jansky, MD  amLODipine (NORVASC) 10 MG tablet TAKE 1 TABLET BY MOUTH EVERY DAY  09/16/15  Yes Biagio Borg, MD  aspirin 81 MG EC tablet Take 81 mg by mouth daily.     Yes Historical Provider, MD  cloNIDine (CATAPRES) 0.1 MG tablet TAKE 1 TABLET BY MOUTH TWICE A DAY 09/19/15  Yes Biagio Borg, MD  furosemide (LASIX) 40 MG tablet TAKE 2 TABLETS BY MOUTH EVERY MORNING AND TAKE 1 TABLET EVERY EVENING 08/21/15  Yes Biagio Borg, MD  gabapentin (NEURONTIN) 100 MG capsule Take 1 capsule by mouth 2 (two) times daily. 10/29/15  Yes Historical Provider, MD  glipiZIDE (GLUCOTROL XL) 5 MG 24 hr tablet Take 1 tablet (5 mg total) by mouth daily with breakfast. 11/06/14  Yes Biagio Borg, MD  HYDROcodone-acetaminophen (NORCO/VICODIN) 5-325 MG tablet TAKE 1 TABLET 2 TO 3 TIMES A DAY AS NEEDED FOR PAIN 10/31/15  Yes Historical Provider, MD  Multiple Vitamin (MULTIVITAMIN WITH MINERALS) TABS tablet Take 1 tablet by mouth daily.   Yes Historical Provider, MD  nitroGLYCERIN (NITRODUR - DOSED IN MG/24 HR) 0.2 mg/hr patch APPLY 1 PATCH DAILY. REMOVE OLD PATCH DAILY. CHANGE PATCH LOCATION DAILY. 10/29/15  Yes Historical Provider, MD  oxyCODONE-acetaminophen (PERCOCET/ROXICET) 5-325 MG tablet Take 1 tablet by mouth every 6 (six) hours as needed for moderate pain or severe pain.   Yes Historical Provider, MD  RENVELA 800 MG tablet Take 1,600 mg by mouth 3 (three) times daily with meals.  11/25/15  Yes Historical Provider, MD  simvastatin (ZOCOR) 20 MG tablet Take 1 tablet (20 mg total) by mouth daily at 6 PM. 05/28/15  Yes Biagio Borg, MD  HYDROmorphone (DILAUDID) 2 MG tablet Take 1 tablet (2 mg total) by mouth every 6 (six) hours as needed for severe pain. 11/25/15   Maurina Fawaz Algernon Huxley, MD   BP 163/62 mmHg  Pulse 80  Temp(Src) 99.6 F (37.6 C) (Oral)  Resp 13  Ht '4\' 11"'  (1.499 m)  Wt 54.885 kg  BMI 24.43 kg/m2  SpO2 98% Physical Exam  Constitutional: She is oriented to person, place, and time. She appears well-developed and well-nourished. No distress.  HENT:  Head: Normocephalic and atraumatic.   Right Ear: External ear normal.  Left Ear: External ear normal.  Nose: Nose normal.  Mouth/Throat: Oropharynx is clear and moist. No oropharyngeal exudate.  Eyes: Conjunctivae and EOM are normal. Pupils are equal, round, and reactive to light. Right eye exhibits no discharge. Left eye exhibits no discharge.  Neck: Normal range of motion. Neck supple.  Cardiovascular: Normal rate, regular rhythm, normal heart sounds and intact distal pulses.  Exam reveals no gallop and no friction rub.   No murmur heard. Pulmonary/Chest: Effort normal and breath sounds normal. No respiratory distress. She has no wheezes. She has no rales.  Abdominal: Soft. Bowel sounds are normal. She exhibits no distension and no mass. There is no tenderness. There is no rebound and no guarding.  Musculoskeletal: Normal range of motion. She exhibits no edema or tenderness.  S/p R BKA. Necrotic L great toe, with dusky appearance to the L 2-4th toes. Palpable pedal pulse. Severe TTP of the toe itself,  with mild TTP to the dorsum of the left foot.   Neurological: She is alert and oriented to person, place, and time. She exhibits normal muscle tone.  Skin: Skin is warm and dry. No rash noted. She is not diaphoretic.  Psychiatric: She has a normal mood and affect. Her behavior is normal. Judgment and thought content normal.    ED Course  Procedures (including critical care time) Labs Review Labs Reviewed  CBC WITH DIFFERENTIAL/PLATELET - Abnormal; Notable for the following:    RBC 3.41 (*)    Hemoglobin 10.3 (*)    HCT 32.1 (*)    RDW 17.1 (*)    All other components within normal limits  BASIC METABOLIC PANEL - Abnormal; Notable for the following:    Chloride 95 (*)    Glucose, Bld 186 (*)    Creatinine, Ser 3.43 (*)    GFR calc non Af Amer 13 (*)    GFR calc Af Amer 15 (*)    Anion gap 16 (*)    All other components within normal limits  SEDIMENTATION RATE - Abnormal; Notable for the following:    Sed Rate 115 (*)     All other components within normal limits  C-REACTIVE PROTEIN - Abnormal; Notable for the following:    CRP 13.0 (*)    All other components within normal limits    Imaging Review Dg Foot Complete Left  11/25/2015  CLINICAL DATA:  LEFT foot pain, necrotic great toe LEFT foot 3 months, progressively worse, history diabetes mellitus, hyperlipidemia, renal insufficiency, venous insufficiency EXAM: LEFT FOOT - COMPLETE 3+ VIEW COMPARISON:  05/30/2015 FINDINGS: Diffuse osseous demineralization. Joint spaces preserved. Soft tissue swelling at medial aspect of first MTP joint. No acute fracture, dislocation, or bone destruction. Scattered small vessel vascular calcification throughout foot. Soft tissue swelling particularly at plantar aspect of LEFT foot. IMPRESSION: No definite acute osseous abnormalities. Electronically Signed   By: Lavonia Dana M.D.   On: 11/25/2015 21:15   I have personally reviewed and evaluated these images and lab results as part of my medical decision-making.   EKG Interpretation None      MDM   Patient's left great toe is noted to be necrotic and black. Patient reports 10 out of 10 pain. Dilaudid given with moderate pain relief. X-ray of the left foot shows no evidence of bony breakdown, patient denies fevers, no leukocytosis, and I doubt osteomyelitis at this time. ESR and CRP are elevated, though patient has end-stage renal disease and is on hemodialysis. I have sent in Epic inbox to Dr. Sharol Given, the patient's orthopedic doctor, requesting follow-up for definitive management of the patient's pain as it is interfering with her ability to ambulate. We'll prescribe a small amount of oral Dilaudid as her home Percocet does not seem to be helping with the pain. I counseled the patient not to take these 2 medications together and not to take more medication than prescribed. She was instructed to return to the emergency department for fevers or systemic symptoms. Case management was  consultated and will be arranging home health for the patient on days where she does not have dialysis. She stable for discharge home.   Final diagnoses:  Necrotic toes ([place])        Marrah Vanevery Algernon Huxley, MD 11/25/15 [id number]  Carmin Muskrat, MD 11/26/15 1556

## 2015-11-25 NOTE — Care Management (Signed)
ED CM received consult concerning [place] needs.  Patient is HD patient who lives alone, and may benefit from [facility] services to assist with managing care.   Explianed HH service  and [facility] community care management program patient and sister are agreeable. Offered choice [facility] selected. Verified patient's contact information.  Referral faxed to [facility] [phone] received fax confirmation.  No further questions or concerns verbalized. Contact information provided should further question or concerns should arise.

## 2015-11-25 NOTE — ED Notes (Signed)
Pt brought to ED by [facility] for 10/10 left foot pain, pt is having a necrotic great toe on her left foot for 3 months pain getting progressively worse, Hx of HD MWF, last HD today, DM, right BKA.

## 2015-11-26 NOTE — Care Management Note (Signed)
Case Management Note  Patient Details  Name: Carolyn Valenzuela MRN: [id number] Date of Birth: 07/27/1951  Subjective/Objective:       Patient  Presented to [facility] ED with foot pain            Action/Plan: CM met with patient and sister [name] [phone] concerning recommendations for [facility] services RN/HHA. Patient is ESRD on HD M/W/F and is lt BKA and lives alone. Family is concerned that patient is not caring for self and is concerned, noticed her greater rt toe to be gangrenous. CM discussed [facility] services and also [facility] in which patient may benefit from, patient is agreeable. Offered choice, selected [facility], verified patient's contact information. Referral sent to [facility] as well and referral faxed to [facility] [phone], confirmation received. Explained to patient and family someone from [facility] will contact patient by phone 24- 48 hours post discharge, teach back done, patient verbalized understanding. No further questions or concerns voiced.    Expected Discharge Date:       11/25/2015           Expected Discharge Plan:  [place]  In-House Referral:     Discharge planning Services  CM Consult  Post Acute Care Choice:  Home Health Choice offered to:     DME Arranged:    DME Agency:     HH Arranged:  RN, Nurse's Aide [place] Agency:  [place], [place]  Status of Service:  Completed, signed off  Medicare Important Message Given:    Date Medicare IM Given:    Medicare IM give by:    Date Additional Medicare IM Given:    Additional Medicare Important Message give by:     If discussed at [place] of Stay Meetings, dates discussed:    Additional CommentsLaurena Slimmer, RN 11/26/2015, 11:04 PM

## 2015-11-29 ENCOUNTER — Inpatient Hospital Stay (HOSPITAL_COMMUNITY): Payer: BC Managed Care – PPO | Admitting: Anesthesiology

## 2015-11-29 ENCOUNTER — Encounter (HOSPITAL_COMMUNITY)
Admission: AD | Disposition: A | Discharge status: Rehabilitation Facility | Payer: Self-pay | Source: Ambulatory Visit | Attending: Orthopedic Surgery

## 2015-11-29 ENCOUNTER — Encounter (HOSPITAL_COMMUNITY): Payer: Self-pay | Admitting: *Deleted

## 2015-11-29 ENCOUNTER — Inpatient Hospital Stay (HOSPITAL_COMMUNITY): Payer: BC Managed Care – PPO

## 2015-11-29 ENCOUNTER — Other Ambulatory Visit (HOSPITAL_COMMUNITY): Payer: Self-pay | Admitting: Family

## 2015-11-29 ENCOUNTER — Inpatient Hospital Stay (HOSPITAL_COMMUNITY)
Admission: AD | Admit: 2015-11-29 | Discharge: 2015-12-04 | DRG: 239 | Disposition: A | Discharge status: Rehabilitation Facility | Payer: BC Managed Care – PPO | Source: Ambulatory Visit | Attending: Orthopedic Surgery | Admitting: Orthopedic Surgery

## 2015-11-29 DIAGNOSIS — E1142 Type 2 diabetes mellitus with diabetic polyneuropathy: Secondary | ICD-10-CM | POA: Diagnosis present

## 2015-11-29 DIAGNOSIS — Z9841 Cataract extraction status, right eye: Secondary | ICD-10-CM | POA: Diagnosis not present

## 2015-11-29 DIAGNOSIS — R1310 Dysphagia, unspecified: Secondary | ICD-10-CM | POA: Diagnosis not present

## 2015-11-29 DIAGNOSIS — L97529 Non-pressure chronic ulcer of other part of left foot with unspecified severity: Secondary | ICD-10-CM | POA: Diagnosis present

## 2015-11-29 DIAGNOSIS — R4182 Altered mental status, unspecified: Secondary | ICD-10-CM

## 2015-11-29 DIAGNOSIS — I120 Hypertensive chronic kidney disease with stage 5 chronic kidney disease or end stage renal disease: Secondary | ICD-10-CM | POA: Diagnosis present

## 2015-11-29 DIAGNOSIS — R0902 Hypoxemia: Secondary | ICD-10-CM | POA: Diagnosis not present

## 2015-11-29 DIAGNOSIS — E1122 Type 2 diabetes mellitus with diabetic chronic kidney disease: Secondary | ICD-10-CM | POA: Diagnosis present

## 2015-11-29 DIAGNOSIS — R509 Fever, unspecified: Secondary | ICD-10-CM | POA: Diagnosis not present

## 2015-11-29 DIAGNOSIS — M62838 Other muscle spasm: Secondary | ICD-10-CM | POA: Diagnosis not present

## 2015-11-29 DIAGNOSIS — E785 Hyperlipidemia, unspecified: Secondary | ICD-10-CM | POA: Diagnosis present

## 2015-11-29 DIAGNOSIS — Z992 Dependence on renal dialysis: Secondary | ICD-10-CM

## 2015-11-29 DIAGNOSIS — N2581 Secondary hyperparathyroidism of renal origin: Secondary | ICD-10-CM | POA: Diagnosis present

## 2015-11-29 DIAGNOSIS — D72829 Elevated white blood cell count, unspecified: Secondary | ICD-10-CM | POA: Diagnosis not present

## 2015-11-29 DIAGNOSIS — G8918 Other acute postprocedural pain: Secondary | ICD-10-CM | POA: Insufficient documentation

## 2015-11-29 DIAGNOSIS — Z7982 Long term (current) use of aspirin: Secondary | ICD-10-CM | POA: Diagnosis not present

## 2015-11-29 DIAGNOSIS — M79672 Pain in left foot: Secondary | ICD-10-CM | POA: Diagnosis present

## 2015-11-29 DIAGNOSIS — Z9842 Cataract extraction status, left eye: Secondary | ICD-10-CM | POA: Diagnosis not present

## 2015-11-29 DIAGNOSIS — I70262 Atherosclerosis of native arteries of extremities with gangrene, left leg: Secondary | ICD-10-CM | POA: Diagnosis present

## 2015-11-29 DIAGNOSIS — R7309 Other abnormal glucose: Secondary | ICD-10-CM | POA: Diagnosis not present

## 2015-11-29 DIAGNOSIS — Z89512 Acquired absence of left leg below knee: Secondary | ICD-10-CM | POA: Diagnosis not present

## 2015-11-29 DIAGNOSIS — I10 Essential (primary) hypertension: Secondary | ICD-10-CM | POA: Diagnosis not present

## 2015-11-29 DIAGNOSIS — Z4781 Encounter for orthopedic aftercare following surgical amputation: Secondary | ICD-10-CM | POA: Diagnosis not present

## 2015-11-29 DIAGNOSIS — D631 Anemia in chronic kidney disease: Secondary | ICD-10-CM | POA: Diagnosis present

## 2015-11-29 DIAGNOSIS — R739 Hyperglycemia, unspecified: Secondary | ICD-10-CM | POA: Diagnosis not present

## 2015-11-29 DIAGNOSIS — D62 Acute posthemorrhagic anemia: Secondary | ICD-10-CM | POA: Insufficient documentation

## 2015-11-29 DIAGNOSIS — D638 Anemia in other chronic diseases classified elsewhere: Secondary | ICD-10-CM | POA: Insufficient documentation

## 2015-11-29 DIAGNOSIS — I96 Gangrene, not elsewhere classified: Secondary | ICD-10-CM

## 2015-11-29 DIAGNOSIS — N186 End stage renal disease: Secondary | ICD-10-CM | POA: Diagnosis not present

## 2015-11-29 DIAGNOSIS — E1152 Type 2 diabetes mellitus with diabetic peripheral angiopathy with gangrene: Principal | ICD-10-CM | POA: Diagnosis present

## 2015-11-29 DIAGNOSIS — IMO0002 Reserved for concepts with insufficient information to code with codable children: Secondary | ICD-10-CM

## 2015-11-29 DIAGNOSIS — R269 Unspecified abnormalities of gait and mobility: Secondary | ICD-10-CM | POA: Diagnosis not present

## 2015-11-29 DIAGNOSIS — R030 Elevated blood-pressure reading, without diagnosis of hypertension: Secondary | ICD-10-CM | POA: Diagnosis not present

## 2015-11-29 HISTORY — PX: AMPUTATION: SHX166

## 2015-11-29 LAB — POCT I-STAT 4, (NA,K, GLUC, HGB,HCT)
Glucose, Bld: 348 mg/dL — ABNORMAL HIGH (ref 65–99)
HCT: 30 % — ABNORMAL LOW (ref 36.0–46.0)
Hemoglobin: 10.2 g/dL — ABNORMAL LOW (ref 12.0–15.0)
Potassium: 5.5 mmol/L — ABNORMAL HIGH (ref 3.5–5.1)
Sodium: 131 mmol/L — ABNORMAL LOW (ref 135–145)

## 2015-11-29 LAB — GLUCOSE, CAPILLARY
Glucose-Capillary: 330 mg/dL — ABNORMAL HIGH (ref 65–99)
Glucose-Capillary: 276 mg/dL — ABNORMAL HIGH (ref 65–99)
Glucose-Capillary: 271 mg/dL — ABNORMAL HIGH (ref 65–99)
Glucose-Capillary: 241 mg/dL — ABNORMAL HIGH (ref 65–99)
Glucose-Capillary: 207 mg/dL — ABNORMAL HIGH (ref 65–99)

## 2015-11-29 LAB — RENAL FUNCTION PANEL
Albumin: 2.7 g/dL — ABNORMAL LOW (ref 3.5–5.0)
Anion gap: 18 — ABNORMAL HIGH (ref 5–15)
BUN: 65 mg/dL — ABNORMAL HIGH (ref 6–20)
CO2: 20 mmol/L — ABNORMAL LOW (ref 22–32)
Calcium: 8.4 mg/dL — ABNORMAL LOW (ref 8.9–10.3)
Chloride: 97 mmol/L — ABNORMAL LOW (ref 101–111)
Creatinine, Ser: 11.69 mg/dL — ABNORMAL HIGH (ref 0.44–1.00)
GFR calc Af Amer: 3 mL/min — ABNORMAL LOW (ref >60)
GFR calc non Af Amer: 3 mL/min — ABNORMAL LOW (ref >60)
Glucose, Bld: 232 mg/dL — ABNORMAL HIGH (ref 65–99)
Phosphorus: 8.1 mg/dL — ABNORMAL HIGH (ref 2.5–4.6)
Potassium: 6.4 mmol/L (ref 3.5–5.1)
Sodium: 135 mmol/L (ref 135–145)

## 2015-11-29 LAB — CBC
HCT: 25 % — ABNORMAL LOW (ref 36.0–46.0)
Hemoglobin: 8.4 g/dL — ABNORMAL LOW (ref 12.0–15.0)
MCH: 31.2 pg (ref 26.0–34.0)
MCHC: 33.6 g/dL (ref 30.0–36.0)
MCV: 92.9 fL (ref 78.0–100.0)
Platelets: 354 10*3/uL (ref 150–400)
RBC: 2.69 MIL/uL — ABNORMAL LOW (ref 3.87–5.11)
RDW: 15.9 % — ABNORMAL HIGH (ref 11.5–15.5)
WBC: 10.2 10*3/uL (ref 4.0–10.5)

## 2015-11-29 SURGERY — AMPUTATION BELOW KNEE
Anesthesia: General | Site: Leg Lower | Laterality: Left

## 2015-11-29 MED ORDER — MIDAZOLAM HCL 5 MG/5ML IJ SOLN
INTRAMUSCULAR | Status: DC | PRN
  Administered 2015-11-29: 1 mg via INTRAVENOUS

## 2015-11-29 MED ORDER — INSULIN ASPART 100 UNIT/ML ~~LOC~~ SOLN
0.0000 [IU] | Freq: Three times a day (TID) | SUBCUTANEOUS | Status: DC
  Administered 2015-11-30: 1 [IU] via SUBCUTANEOUS
  Administered 2015-12-04: 5 [IU] via SUBCUTANEOUS

## 2015-11-29 MED ORDER — CHLORHEXIDINE GLUCONATE 4 % EX LIQD: 60.0000 mL | Freq: Once | CUTANEOUS | Status: DC

## 2015-11-29 MED ORDER — INSULIN ASPART 100 UNIT/ML ~~LOC~~ SOLN
5.0000 [IU] | Freq: Once | SUBCUTANEOUS | Status: AC
  Administered 2015-11-29: 5 [IU] via SUBCUTANEOUS

## 2015-11-29 MED ORDER — METHOCARBAMOL 1000 MG/10ML IJ SOLN
500.0000 mg | Freq: Four times a day (QID) | INTRAMUSCULAR | Status: DC | PRN
  Filled 2015-11-29: qty 5

## 2015-11-29 MED ORDER — ONDANSETRON HCL 4 MG/2ML IJ SOLN: 4.0000 mg | Freq: Four times a day (QID) | INTRAMUSCULAR | Status: DC | PRN

## 2015-11-29 MED ORDER — METHOCARBAMOL 500 MG PO TABS: 500.0000 mg | ORAL_TABLET | Freq: Four times a day (QID) | ORAL | Status: DC | PRN

## 2015-11-29 MED ORDER — PENTAFLUOROPROP-TETRAFLUOROETH EX AERO: 1.0000 "application " | INHALATION_SPRAY | CUTANEOUS | Status: DC | PRN

## 2015-11-29 MED ORDER — DOCUSATE SODIUM 100 MG PO CAPS
100.0000 mg | ORAL_CAPSULE | Freq: Two times a day (BID) | ORAL | Status: DC
  Administered 2015-11-30 – 2015-12-04 (×8): 100 mg via ORAL
  Filled 2015-11-29 (×8): qty 1

## 2015-11-29 MED ORDER — FUROSEMIDE 20 MG PO TABS
10.0000 mg | ORAL_TABLET | Freq: Every day | ORAL | Status: DC
  Administered 2015-11-30 – 2015-12-03 (×3): 10 mg via ORAL
  Filled 2015-11-29 (×4): qty 1

## 2015-11-29 MED ORDER — ACETAMINOPHEN 650 MG RE SUPP: 650.0000 mg | Freq: Four times a day (QID) | RECTAL | Status: DC | PRN

## 2015-11-29 MED ORDER — HYDROCODONE-ACETAMINOPHEN 10-325 MG PO TABS: 1.0000 | ORAL_TABLET | ORAL | Status: DC | PRN

## 2015-11-29 MED ORDER — AMLODIPINE BESYLATE 10 MG PO TABS
10.0000 mg | ORAL_TABLET | Freq: Every day | ORAL | Status: DC
  Administered 2015-11-30 – 2015-12-03 (×3): 10 mg via ORAL
  Filled 2015-11-29 (×3): qty 1

## 2015-11-29 MED ORDER — SODIUM CHLORIDE 0.9 % IV SOLN: 100.0000 mL | INTRAVENOUS | Status: DC | PRN

## 2015-11-29 MED ORDER — LIDOCAINE HCL (PF) 1 % IJ SOLN: 5.0000 mL | INTRAMUSCULAR | Status: DC | PRN

## 2015-11-29 MED ORDER — ONDANSETRON HCL 4 MG PO TABS: 4.0000 mg | ORAL_TABLET | Freq: Four times a day (QID) | ORAL | Status: DC | PRN

## 2015-11-29 MED ORDER — HYDROMORPHONE HCL 1 MG/ML IJ SOLN
1.0000 mg | INTRAMUSCULAR | Status: DC | PRN
  Administered 2015-11-29 – 2015-11-30 (×3): 1 mg via INTRAVENOUS
  Filled 2015-11-29: qty 1

## 2015-11-29 MED ORDER — ACETAMINOPHEN 325 MG PO TABS
650.0000 mg | ORAL_TABLET | Freq: Four times a day (QID) | ORAL | Status: DC | PRN
  Administered 2015-12-01 – 2015-12-04 (×5): 650 mg via ORAL
  Filled 2015-11-29 (×6): qty 2

## 2015-11-29 MED ORDER — METOCLOPRAMIDE HCL 5 MG/ML IJ SOLN: 5.0000 mg | Freq: Three times a day (TID) | INTRAMUSCULAR | Status: DC | PRN

## 2015-11-29 MED ORDER — SEVELAMER CARBONATE 800 MG PO TABS
1600.0000 mg | ORAL_TABLET | Freq: Three times a day (TID) | ORAL | Status: DC
  Administered 2015-11-30 – 2015-12-04 (×10): 1600 mg via ORAL
  Filled 2015-11-29 (×10): qty 2

## 2015-11-29 MED ORDER — INSULIN ASPART 100 UNIT/ML ~~LOC~~ SOLN
SUBCUTANEOUS | Status: AC
  Filled 2015-11-29: qty 1

## 2015-11-29 MED ORDER — MIDAZOLAM HCL 2 MG/2ML IJ SOLN
INTRAMUSCULAR | Status: AC
  Filled 2015-11-29: qty 2

## 2015-11-29 MED ORDER — LIDOCAINE HCL (CARDIAC) 20 MG/ML IV SOLN
INTRAVENOUS | Status: AC
  Filled 2015-11-29: qty 5

## 2015-11-29 MED ORDER — ACETAMINOPHEN 325 MG PO TABS: 650.0000 mg | ORAL_TABLET | Freq: Four times a day (QID) | ORAL | Status: DC | PRN

## 2015-11-29 MED ORDER — SODIUM CHLORIDE 0.9 % IV SOLN
INTRAVENOUS | Status: DC
  Administered 2015-11-29: 23:00:00 via INTRAVENOUS

## 2015-11-29 MED ORDER — FENTANYL CITRATE (PF) 100 MCG/2ML IJ SOLN
25.0000 ug | INTRAMUSCULAR | Status: AC | PRN
  Administered 2015-11-29 (×8): 25 ug via INTRAVENOUS

## 2015-11-29 MED ORDER — FENTANYL CITRATE (PF) 100 MCG/2ML IJ SOLN
INTRAMUSCULAR | Status: DC | PRN
  Administered 2015-11-29: 50 ug via INTRAVENOUS
  Administered 2015-11-29: 25 ug via INTRAVENOUS
  Administered 2015-11-29: 100 ug via INTRAVENOUS
  Administered 2015-11-29 (×3): 25 ug via INTRAVENOUS

## 2015-11-29 MED ORDER — METOCLOPRAMIDE HCL 5 MG PO TABS: 5.0000 mg | ORAL_TABLET | Freq: Three times a day (TID) | ORAL | Status: DC | PRN

## 2015-11-29 MED ORDER — METHOCARBAMOL 1000 MG/10ML IJ SOLN: 500.0000 mg | Freq: Four times a day (QID) | INTRAVENOUS | Status: DC | PRN

## 2015-11-29 MED ORDER — ONDANSETRON HCL 4 MG/2ML IJ SOLN
INTRAMUSCULAR | Status: DC | PRN
  Administered 2015-11-29: 4 mg via INTRAVENOUS

## 2015-11-29 MED ORDER — SIMVASTATIN 20 MG PO TABS
20.0000 mg | ORAL_TABLET | Freq: Every day | ORAL | Status: DC
  Administered 2015-11-30 – 2015-12-04 (×5): 20 mg via ORAL
  Filled 2015-11-29 (×5): qty 1

## 2015-11-29 MED ORDER — GLIPIZIDE ER 5 MG PO TB24
5.0000 mg | ORAL_TABLET | Freq: Every day | ORAL | Status: DC
  Administered 2015-11-30 – 2015-12-01 (×2): 5 mg via ORAL
  Filled 2015-11-29 (×5): qty 1

## 2015-11-29 MED ORDER — METHOCARBAMOL 500 MG PO TABS
500.0000 mg | ORAL_TABLET | Freq: Four times a day (QID) | ORAL | Status: DC | PRN
  Administered 2015-11-30 – 2015-12-01 (×2): 500 mg via ORAL
  Filled 2015-11-29 (×2): qty 1

## 2015-11-29 MED ORDER — CEFAZOLIN SODIUM 1-5 GM-% IV SOLN
1.0000 g | Freq: Four times a day (QID) | INTRAVENOUS | Status: AC
  Administered 2015-11-29 – 2015-11-30 (×3): 1 g via INTRAVENOUS
  Filled 2015-11-29 (×3): qty 50

## 2015-11-29 MED ORDER — ONDANSETRON HCL 4 MG/2ML IJ SOLN: 4.0000 mg | Freq: Once | INTRAMUSCULAR | Status: DC | PRN

## 2015-11-29 MED ORDER — CLONIDINE HCL 0.1 MG PO TABS
0.1000 mg | ORAL_TABLET | Freq: Two times a day (BID) | ORAL | Status: DC
  Administered 2015-11-30 – 2015-12-03 (×6): 0.1 mg via ORAL
  Filled 2015-11-29 (×6): qty 1

## 2015-11-29 MED ORDER — FENTANYL CITRATE (PF) 250 MCG/5ML IJ SOLN
INTRAMUSCULAR | Status: AC
  Filled 2015-11-29: qty 5

## 2015-11-29 MED ORDER — LIDOCAINE HCL (CARDIAC) 20 MG/ML IV SOLN
INTRAVENOUS | Status: DC | PRN
  Administered 2015-11-29: 70 mg via INTRAVENOUS

## 2015-11-29 MED ORDER — GABAPENTIN 100 MG PO CAPS
100.0000 mg | ORAL_CAPSULE | Freq: Two times a day (BID) | ORAL | Status: DC
  Administered 2015-11-30 – 2015-12-01 (×3): 100 mg via ORAL
  Filled 2015-11-29 (×3): qty 1

## 2015-11-29 MED ORDER — BISACODYL 10 MG RE SUPP: 10.0000 mg | Freq: Every day | RECTAL | Status: DC | PRN

## 2015-11-29 MED ORDER — FENTANYL CITRATE (PF) 100 MCG/2ML IJ SOLN
INTRAMUSCULAR | Status: AC
  Administered 2015-11-29: 25 ug via INTRAVENOUS
  Filled 2015-11-29: qty 2

## 2015-11-29 MED ORDER — POLYETHYLENE GLYCOL 3350 17 G PO PACK
17.0000 g | PACK | Freq: Every day | ORAL | Status: DC | PRN
  Administered 2015-12-04: 17 g via ORAL
  Filled 2015-11-29: qty 1

## 2015-11-29 MED ORDER — SODIUM CHLORIDE 0.9 % IV SOLN
INTRAVENOUS | Status: DC
  Administered 2015-11-29: 13:00:00 via INTRAVENOUS

## 2015-11-29 MED ORDER — PROPOFOL 10 MG/ML IV BOLUS
INTRAVENOUS | Status: AC
  Filled 2015-11-29: qty 20

## 2015-11-29 MED ORDER — 0.9 % SODIUM CHLORIDE (POUR BTL) OPTIME
TOPICAL | Status: DC | PRN
  Administered 2015-11-29: 1000 mL

## 2015-11-29 MED ORDER — PROPOFOL 10 MG/ML IV BOLUS
INTRAVENOUS | Status: DC | PRN
  Administered 2015-11-29: 120 mg via INTRAVENOUS

## 2015-11-29 MED ORDER — CEFAZOLIN SODIUM-DEXTROSE 2-4 GM/100ML-% IV SOLN
2.0000 g | INTRAVENOUS | Status: AC
  Administered 2015-11-29: 2 g via INTRAVENOUS
  Filled 2015-11-29: qty 100

## 2015-11-29 MED ORDER — HYDROMORPHONE HCL 1 MG/ML IJ SOLN: 1.0000 mg | INTRAMUSCULAR | Status: DC | PRN

## 2015-11-29 MED ORDER — LIDOCAINE-PRILOCAINE 2.5-2.5 % EX CREA: 1.0000 "application " | TOPICAL_CREAM | CUTANEOUS | Status: DC | PRN

## 2015-11-29 MED ORDER — HYDROCODONE-ACETAMINOPHEN 10-325 MG PO TABS
1.0000 | ORAL_TABLET | ORAL | Status: DC | PRN
  Administered 2015-11-29: 2 via ORAL
  Administered 2015-11-30: 1 via ORAL
  Administered 2015-11-30 – 2015-12-01 (×5): 2 via ORAL
  Administered 2015-12-02 – 2015-12-04 (×3): 1 via ORAL
  Filled 2015-11-29 (×2): qty 2
  Filled 2015-11-29 (×3): qty 1
  Filled 2015-11-29 (×5): qty 2

## 2015-11-29 SURGICAL SUPPLY — 40 items
BLADE SAW RECIP 87.9 MT (BLADE) ×2 IMPLANT
BLADE SURG 21 STRL SS (BLADE) ×2 IMPLANT
BNDG COHESIVE 6X5 TAN STRL LF (GAUZE/BANDAGES/DRESSINGS) IMPLANT
BNDG GAUZE ELAST 4 BULKY (GAUZE/BANDAGES/DRESSINGS) IMPLANT
CANISTER WOUND CARE 500ML ATS (WOUND CARE) ×2 IMPLANT
COVER SURGICAL LIGHT HANDLE (MISCELLANEOUS) ×4 IMPLANT
CUFF TOURNIQUET SINGLE 34IN LL (TOURNIQUET CUFF) ×2 IMPLANT
CUFF TOURNIQUET SINGLE 44IN (TOURNIQUET CUFF) IMPLANT
DRAPE EXTREMITY T 121X128X90 (DRAPE) ×2 IMPLANT
DRAPE INCISE IOBAN 66X45 STRL (DRAPES) IMPLANT
DRAPE PROXIMA HALF (DRAPES) ×2 IMPLANT
DRAPE U-SHAPE 47X51 STRL (DRAPES) ×2 IMPLANT
DRSG ADAPTIC 3X8 NADH LF (GAUZE/BANDAGES/DRESSINGS) IMPLANT
DRSG PAD ABDOMINAL 8X10 ST (GAUZE/BANDAGES/DRESSINGS) IMPLANT
DURAPREP 26ML APPLICATOR (WOUND CARE) ×2 IMPLANT
ELECT REM PT RETURN 9FT ADLT (ELECTROSURGICAL) ×2
ELECTRODE REM PT RTRN 9FT ADLT (ELECTROSURGICAL) ×1 IMPLANT
GAUZE SPONGE 4X4 12PLY STRL (GAUZE/BANDAGES/DRESSINGS) IMPLANT
GLOVE BIOGEL PI IND STRL 7.5 (GLOVE) ×1 IMPLANT
GLOVE BIOGEL PI IND STRL 9 (GLOVE) ×1 IMPLANT
GLOVE BIOGEL PI INDICATOR 7.5 (GLOVE) ×1
GLOVE BIOGEL PI INDICATOR 9 (GLOVE) ×1
GLOVE ECLIPSE 7.0 STRL STRAW (GLOVE) ×2 IMPLANT
GLOVE SURG ORTHO 9.0 STRL STRW (GLOVE) ×2 IMPLANT
GOWN STRL REUS W/ TWL XL LVL3 (GOWN DISPOSABLE) ×3 IMPLANT
GOWN STRL REUS W/TWL XL LVL3 (GOWN DISPOSABLE) ×6
KIT BASIN OR (CUSTOM PROCEDURE TRAY) ×2 IMPLANT
KIT PREVENA INCISION MGT20CM45 (CANNISTER) ×2 IMPLANT
KIT ROOM TURNOVER OR (KITS) ×2 IMPLANT
MANIFOLD NEPTUNE II (INSTRUMENTS) ×2 IMPLANT
NS IRRIG 1000ML POUR BTL (IV SOLUTION) ×2 IMPLANT
PACK GENERAL/GYN (CUSTOM PROCEDURE TRAY) ×2 IMPLANT
PAD ARMBOARD 7.5X6 YLW CONV (MISCELLANEOUS) ×2 IMPLANT
SPONGE LAP 18X18 X RAY DECT (DISPOSABLE) ×2 IMPLANT
STAPLER VISISTAT 35W (STAPLE) IMPLANT
STOCKINETTE IMPERVIOUS LG (DRAPES) ×2 IMPLANT
SUT SILK 2 0 (SUTURE) ×2
SUT SILK 2-0 18XBRD TIE 12 (SUTURE) ×1 IMPLANT
SUT VIC AB 1 CTX 27 (SUTURE) ×2 IMPLANT
TOWEL OR 17X26 10 PK STRL BLUE (TOWEL DISPOSABLE) ×2 IMPLANT

## 2015-11-29 NOTE — Anesthesia Preprocedure Evaluation (Addendum)
Anesthesia Evaluation  Patient identified by MRN, date of birth, ID band Patient awake    Reviewed: Allergy & Precautions, NPO status , Patient's Chart, lab work & pertinent test results  History of Anesthesia Complications Negative for: history of anesthetic complications  Airway Mallampati: II  TM Distance: >3 FB Neck ROM: Full    Dental  (+) Edentulous Upper, Edentulous Lower, Dental Advisory Given   Pulmonary    breath sounds clear to auscultation       Cardiovascular hypertension, Pt. on medications  Rhythm:Regular Rate:Normal     Neuro/Psych negative psych ROS   GI/Hepatic Neg liver ROS,   Endo/Other  diabetes, Poorly Controlled, Type 2, Oral Hypoglycemic Agents  Renal/GU ESRF and DialysisRenal diseaseM,W,F     Musculoskeletal   Abdominal   Peds  Hematology   Anesthesia Other Findings   Reproductive/Obstetrics negative OB ROS                            Anesthesia Physical Anesthesia Plan  ASA: III  Anesthesia Plan: General   Post-op Pain Management:    Induction: Intravenous  Airway Management Planned: LMA  Additional Equipment:   Intra-op Plan:   Post-operative Plan:   Informed Consent: I have reviewed the patients History and Physical, chart, labs and discussed the procedure including the risks, benefits and alternatives for the proposed anesthesia with the patient or authorized representative who has indicated his/her understanding and acceptance.     Plan Discussed with: CRNA and Anesthesiologist  Anesthesia Plan Comments:         Anesthesia Quick Evaluation

## 2015-11-29 NOTE — Progress Notes (Addendum)
Dr. Jillyn Hidden called and informed of glucose of 348, Potassium of 5.5 per istat 4 results and that pt's last HD was on Monday. Ordered to give pt's normal home meds for glucose control which is Glucotrol XL 5 mg PO which was given.

## 2015-11-29 NOTE — Consult Note (Signed)
[facility] Renal Consultation Note    Indication for Consultation:  Management of ESRD/hemodialysis; anemia, hypertension/volume and secondary hyperparathyroidism PCP:  HPI: Carolyn Valenzuela is a 65 y.o. female with ESRD 2/2 DM/HTN (HD initiated 1/2-14/2016) on hemodialysis MWF at [facility]. PMH significant for uncontrolled DM, hypertension, critical lower limb ischemia, C-diff, cervical radiculopathy, anemia of chronic disease, secondary hyperparathyroidism, sinusitis, allergic rhinitis, R BKA 2015.  Carolyn Valenzuela is being seen in perioperative area where she is awaiting for L BKA per Dr. Sharol Given for necrotic L. Great toe. Per Dr. Lianne Moris December 2016, patient has only intact anterior tibial artery flow to ankle level with only collateral flow distally into the foot. She has been followed by Dr. Sharol Given attempting limb salvage. She presented to ED 02/27 with 10/10 pain in L great toe. Was Dc'd home after given pain medication.   Currently she has no C/O pain/SOB. "I'm waiting on them to come take me for surgery". Having discussion with RN to her understanding of procedure.  Denies fever, chills, nausea, vomiting, malaise, abdominal pain, flank pain, changes in vision, changes in hearing, dizziness, vertigo, HA. Sister is present with patient.   Patient has HD at [facility], is usually compliant with HD prescription. She has issues with hyperphosphatemia and takes Fosrenol and receives Hectoral with HD treatments for phos/PTH suppression. She receives Mircera 50 mcg IV q 2 weeks for anemia. Last in center values: HGB 9.8 Plt 406 Fe 42 TIBC 176 Tsat 24. Phos 7.3 Ca 9.4 C Ca 9.8 (11/22/15)  Past Medical History  Diagnosis Date  . DIABETES MELLITUS, UNCONTROLLED 05/21/2009  . HYPERLIPIDEMIA 03/30/2007  . ANEMIA-IRON DEFICIENCY 01/25/2008  . HYPERTENSION 01/25/2008  . SINUSITIS- ACUTE-NOS 01/25/2008  . SECONDARY HYPERPARATHYROIDISM 05/21/2009  . CERVICAL RADICULOPATHY, LEFT 01/25/2008   . BACK PAIN 05/02/2009  . NUMBNESS 05/21/2009  . Allergic rhinitis, cause unspecified 12/08/2010  . Foot ulcer ([place])     right lateral malleolus  . Critical lower limb ischemia   . RENAL INSUFFICIENCY 02/01/2008    HD pt.   Past Surgical History  Procedure Laterality Date  . Av fistula placement Left   . Eye surgery Bilateral     cataracts removed, left eye still has some oil in it.  . Colonoscopy    . Amputation Right 03/15/2014    Procedure: RIGHT  LEG  BELOW KNEE AMPUTATION ;  Surgeon: [name], MD;  Location: [place];  Service: Orthopedics;  Laterality: Right;  . Insertion of dialysis catheter N/A 09/03/2014    Procedure: INSERTION OF DIALYSIS CATHETER RIGHT INTERNAL JUGULAR VEIN;  Surgeon: Conrad [place], MD;  Location: [place];  Service: Vascular;  Laterality: N/A;  . Av fistula placement Left 09/06/2014    Procedure: ARTERIOVENOUS (AV) FISTULA CREATION-Left Brachiocephalic;  Surgeon: Conrad , MD;  Location: [place];  Service: Vascular;  Laterality: Left;  . Peripheral vascular catheterization N/A 08/13/2015    Procedure: Abdominal Aortogram;  Surgeon: Elam Dutch, MD;  Location: [place] CV [facility];  Service: Cardiovascular;  Laterality: N/A;   Family History  Problem Relation Age of Onset  . Cancer Mother     Pancreatic   Social History:  reports that she has never smoked. She has never used smokeless tobacco. She reports that she does not drink alcohol or use illicit drugs. Allergies  Allergen Reactions  . Oxycodone Itching   Prior to Admission medications   Medication Sig Start Date End Date Taking? Authorizing Provider  acetaminophen (TYLENOL) 325  MG tablet Take 2 tablets (650 mg total) by mouth every 6 (six) hours as needed for mild pain, moderate pain or fever. 09/13/14  Yes Modena Jansky, MD  amLODipine (NORVASC) 10 MG tablet TAKE 1 TABLET BY MOUTH EVERY DAY 09/16/15  Yes Biagio Borg, MD  aspirin 81 MG EC tablet Take 81 mg by mouth daily.     Yes Historical Provider,  MD  cloNIDine (CATAPRES) 0.1 MG tablet TAKE 1 TABLET BY MOUTH TWICE A DAY 09/19/15  Yes Biagio Borg, MD  furosemide (LASIX) 40 MG tablet TAKE 2 TABLETS BY MOUTH EVERY MORNING AND TAKE 1 TABLET EVERY EVENING 08/21/15  Yes Biagio Borg, MD  gabapentin (NEURONTIN) 100 MG capsule Take 1 capsule by mouth 2 (two) times daily. 10/29/15  Yes Historical Provider, MD  glipiZIDE (GLUCOTROL XL) 5 MG 24 hr tablet Take 1 tablet (5 mg total) by mouth daily with breakfast. 11/06/14  Yes Biagio Borg, MD  HYDROcodone-acetaminophen (NORCO/VICODIN) 5-325 MG tablet TAKE 1 TABLET 2 TO 3 TIMES A DAY AS NEEDED FOR PAIN 10/31/15  Yes Historical Provider, MD  HYDROmorphone (DILAUDID) 2 MG tablet Take 1 tablet (2 mg total) by mouth every 6 (six) hours as needed for severe pain. 11/25/15  Yes Jenifer Algernon Huxley, MD  Multiple Vitamin (MULTIVITAMIN WITH MINERALS) TABS tablet Take 1 tablet by mouth daily.   Yes Historical Provider, MD  nitroGLYCERIN (NITRODUR - DOSED IN MG/24 HR) 0.2 mg/hr patch APPLY 1 PATCH DAILY. REMOVE OLD PATCH DAILY. CHANGE PATCH LOCATION DAILY. 10/29/15  Yes Historical Provider, MD  oxyCODONE-acetaminophen (PERCOCET/ROXICET) 5-325 MG tablet Take 1 tablet by mouth every 6 (six) hours as needed for moderate pain or severe pain.   Yes Historical Provider, MD  RENVELA 800 MG tablet Take 1,600 mg by mouth 3 (three) times daily with meals.  11/25/15  Yes Historical Provider, MD  simvastatin (ZOCOR) 20 MG tablet Take 1 tablet (20 mg total) by mouth daily at 6 PM. 05/28/15  Yes Biagio Borg, MD   Current Facility-Administered Medications  Medication Dose Route Frequency Provider Last Rate Last Dose  . 0.9 %  sodium chloride infusion   Intravenous Continuous Lyn Hollingshead, MD 10 mL/hr at 11/29/15 1326    . [START ON 11/30/2015] ceFAZolin (ANCEF) IVPB 2g/100 mL premix  2 g Intravenous On Call to [place], NP      . chlorhexidine (HIBICLENS) 4 % liquid 4 application  60 mL Topical Once Suzan Slick, NP      .  insulin aspart (novoLOG) 100 UNIT/ML injection            Labs: Basic Metabolic Panel:  Recent Labs Lab 11/25/15 2021 11/29/15 1303  NA 138 131*  K 4.9 5.5*  CL 95*  --   CO2 27  --   GLUCOSE 186* 348*  BUN 16  --   CREATININE 3.43*  --   CALCIUM 9.4  --    Liver Function Tests: No results for input(s): AST, ALT, ALKPHOS, BILITOT, PROT, ALBUMIN in the last 168 hours. No results for input(s): LIPASE, AMYLASE in the last 168 hours. No results for input(s): AMMONIA in the last 168 hours. CBC:  Recent Labs Lab 11/25/15 2021 11/29/15 1303  WBC 10.0  --   NEUTROABS 7.2  --   HGB 10.3* 10.2*  HCT 32.1* 30.0*  MCV 94.1  --   PLT 366  --    Cardiac Enzymes: No results for input(s): CKTOTAL, CKMB, CKMBINDEX,  TROPONINI in the last 168 hours. CBG:  Recent Labs Lab 11/29/15 1239 11/29/15 1520  GLUCAP 330* 276*   Iron Studies: No results for input(s): IRON, TIBC, TRANSFERRIN, FERRITIN in the last 72 hours. Studies/Results: Dg Chest 2 View  11/29/2015  CLINICAL DATA:  Preop, gangrene of foot EXAM: CHEST  2 VIEW COMPARISON:  09/11/2014 FINDINGS: Cardiomediastinal silhouette is unremarkable. No acute infiltrate or pleural effusion. No pulmonary edema. Mild degenerative changes thoracic spine. Atherosclerotic calcifications of abdominal aorta. IMPRESSION: No active cardiopulmonary disease. Electronically Signed   By: Lahoma Crocker M.D.   On: 11/29/2015 13:20    ROS: As per HPI otherwise negative.   Physical Exam: Filed Vitals:   11/29/15 1234 11/29/15 1235  BP: 189/65 183/61  Pulse: 95   Temp: 100 F (37.8 C)   TempSrc: Oral   Resp: 14   Weight: 54.885 kg (121 lb)   SpO2: 100%      General: Thin, looks older than stated age. NAD Head: Normocephalic, atraumatic, sclera non-icteric, mucus membranes are moist Neck: Supple. JVD not elevated. Lungs: Clear bilaterally to auscultation without wheezes, rales, or rhonchi. Breathing is unlabored. Heart: RRR with S1 S2. No  murmurs, rubs, or gallops appreciated. Abdomen: Soft, non-tender, non-distended with normoactive bowel sounds. No rebound/guarding. No obvious abdominal masses. M-S:  Strength and tone appear normal for age. Lower extremities: No LE edema. Necrotic R great toe with discoloration of anterior foot extending to L lateral side of foot and toward L ankle. No pulse palpable.  Neuro: Alert and oriented X 3. Moves all extremities spontaneously. Psych:  Responds to questions appropriately with a normal affect. Dialysis Access: LUA AVF +thrill + bruit  Dialysis Orders: Center: [facility] on MWF . 4 hrs 0 min, 180NRe Optiflux, BFR 400, DFR Autoflow 1.5, EDW 52 (kg), Dialysate 3.0 K, 2.25 Ca, UFR Profile: Profile 4, Sodium Model: None, Access: LUA AV Fistula Heparin: 2500 Units per treatment Mircera: 50 mcg IV q 2 weeks (11/13/15) Hectoral: 4 mcg IV Q TTS (last dose 11/25/15)  Assessment/Plan: 1.  Necrotic L Great Toe/severe PVD: scheduled for L BKA per Dr Sharol Given today. 2.  ESRD - [facility] [facility] will have HD tonight after surgery. K+ 5.5 2.0 K bath. No heparin.  3.  Hypertension/volume  - Current wt 54.8-2.8 kg above EDW. Will attempt 2-2.5 liters tonight. Hypertensive at present-has been NPO-no medication. Usually takes amlodipine, coreg and isosorbide mononitrate .  4.  Anemia  - HGB 10.2. No ESA today, follow CBC.  5.  Metabolic bone disease -Cont fosrenol and VDRA. 6.  Nutrition - Last in-center Albumin 3.5. NPO at present. Renal diet, renal vit and nepro when able to eat. 7.  DM: per primary.   Rita H. Owens Shark, NP-C 11/29/2015, 4:04 PM  [employer] [phone]  I have seen and examined this patient and agree with plan as outlined by Juanell Fairly, NP-C.  She is now s/p BKA on left and complaining of some cramping in the back of her leg.  Will plan for HD once she is stable to leave the PACU. Governor Rooks Brylee Berk,MD 11/29/2015 6:12 PM

## 2015-11-29 NOTE — Transfer of Care (Signed)
Immediate Anesthesia Transfer of Care Note  Patient: Carolyn Valenzuela  Procedure(s) Performed: Procedure(s): AMPUTATION BELOW KNEE (Left)  Patient Location: PACU  Anesthesia Type:General  Level of Consciousness: awake, alert , oriented and patient cooperative  Airway & Oxygen Therapy: Patient Spontanous Breathing and Patient connected to nasal cannula oxygen  Post-op Assessment: Report given to RN and Post -op Vital signs reviewed and stable  Post vital signs: Reviewed and stable  Last Vitals:  Filed Vitals:   11/29/15 1234 11/29/15 1235  BP: 189/65 183/61  Pulse: 95   Temp: 37.8 C   Resp: 14     Complications: No apparent anesthesia complications

## 2015-11-29 NOTE — H&P (Signed)
Carolyn Valenzuela is an 65 y.o. female.   Chief Complaint: Dry gangrene left foot HPI: Patient is a 64 year old woman with diabetes and severe peripheral vascular disease status post limb salvage intervention on the left with a transtibial amputation the right and presents at this time after failure of limb salvage for a left transtibial amputation.  Past Medical History  Diagnosis Date  . DIABETES MELLITUS, UNCONTROLLED 05/21/2009  . HYPERLIPIDEMIA 03/30/2007  . ANEMIA-IRON DEFICIENCY 01/25/2008  . HYPERTENSION 01/25/2008  . SINUSITIS- ACUTE-NOS 01/25/2008  . SECONDARY HYPERPARATHYROIDISM 05/21/2009  . CERVICAL RADICULOPATHY, LEFT 01/25/2008  . BACK PAIN 05/02/2009  . NUMBNESS 05/21/2009  . Allergic rhinitis, cause unspecified 12/08/2010  . Foot ulcer ([place])     right lateral malleolus  . Critical lower limb ischemia   . RENAL INSUFFICIENCY 02/01/2008    HD pt.    Past Surgical History  Procedure Laterality Date  . Av fistula placement Left   . Eye surgery Bilateral     cataracts removed, left eye still has some oil in it.  . Colonoscopy    . Amputation Right 03/15/2014    Procedure: RIGHT  LEG  BELOW KNEE AMPUTATION ;  Surgeon: [name], MD;  Location: [place];  Service: Orthopedics;  Laterality: Right;  . Insertion of dialysis catheter N/A 09/03/2014    Procedure: INSERTION OF DIALYSIS CATHETER RIGHT INTERNAL JUGULAR VEIN;  Surgeon: Conrad [place], MD;  Location: [place];  Service: Vascular;  Laterality: N/A;  . Av fistula placement Left 09/06/2014    Procedure: ARTERIOVENOUS (AV) FISTULA CREATION-Left Brachiocephalic;  Surgeon: Conrad [place], MD;  Location: [place];  Service: Vascular;  Laterality: Left;  . Peripheral vascular catheterization N/A 08/13/2015    Procedure: Abdominal Aortogram;  Surgeon: Elam Dutch, MD;  Location: [place] [facility];  Service: Cardiovascular;  Laterality: N/A;    Family History  Problem Relation Age of Onset  . Cancer Mother     Pancreatic   Social History:   reports that she has never smoked. She has never used smokeless tobacco. She reports that she does not drink alcohol or use illicit drugs.  Allergies:  Allergies  Allergen Reactions  . Oxycodone Itching    Medications Prior to Admission  Medication Sig Dispense Refill  . acetaminophen (TYLENOL) 325 MG tablet Take 2 tablets (650 mg total) by mouth every 6 (six) hours as needed for mild pain, moderate pain or fever.    [name] amLODipine (NORVASC) 10 MG tablet TAKE 1 TABLET BY MOUTH EVERY DAY 30 tablet 5  . aspirin 81 MG EC tablet Take 81 mg by mouth daily.      . cloNIDine (CATAPRES) 0.1 MG tablet TAKE 1 TABLET BY MOUTH TWICE A DAY 60 tablet 5  . furosemide (LASIX) 40 MG tablet TAKE 2 TABLETS BY MOUTH EVERY MORNING AND TAKE 1 TABLET EVERY EVENING 90 tablet 3  . gabapentin (NEURONTIN) 100 MG capsule Take 1 capsule by mouth 2 (two) times daily.  3  . glipiZIDE (GLUCOTROL XL) 5 MG 24 hr tablet Take 1 tablet (5 mg total) by mouth daily with breakfast. 90 tablet 3  . HYDROcodone-acetaminophen (NORCO/VICODIN) 5-325 MG tablet TAKE 1 TABLET 2 TO 3 TIMES A DAY AS NEEDED FOR PAIN  0  . HYDROmorphone (DILAUDID) 2 MG tablet Take 1 tablet (2 mg total) by mouth every 6 (six) hours as needed for severe pain. 15 tablet 0  . Multiple Vitamin (MULTIVITAMIN WITH MINERALS) TABS tablet Take 1 tablet by mouth  daily.    . nitroGLYCERIN (NITRODUR - DOSED IN MG/24 HR) 0.2 mg/hr patch APPLY 1 PATCH DAILY. REMOVE OLD PATCH DAILY. CHANGE PATCH LOCATION DAILY.  3  . oxyCODONE-acetaminophen (PERCOCET/ROXICET) 5-325 MG tablet Take 1 tablet by mouth every 6 (six) hours as needed for moderate pain or severe pain.    [name] RENVELA 800 MG tablet Take 1,600 mg by mouth 3 (three) times daily with meals.     . simvastatin (ZOCOR) 20 MG tablet Take 1 tablet (20 mg total) by mouth daily at 6 PM. 90 tablet 3    Results for orders placed or performed during the hospital encounter of 11/29/15 (from the past 48 hour(s))  Glucose, capillary      Status: Abnormal   Collection Time: 11/29/15 12:39 PM  Result Value Ref Range   Glucose-Capillary 330 (H) 65 - 99 mg/dL   Comment 1 Notify RN    Comment 2 Document in Chart   I-STAT 4, (NA,K, GLUC, HGB,HCT)     Status: Abnormal   Collection Time: 11/29/15  1:03 PM  Result Value Ref Range   Sodium 131 (L) 135 - 145 mmol/L   Potassium 5.5 (H) 3.5 - 5.1 mmol/L   Glucose, Bld 348 (H) 65 - 99 mg/dL   HCT 30.0 (L) 36.0 - 46.0 %   Hemoglobin 10.2 (L) 12.0 - 15.0 g/dL  Glucose, capillary     Status: Abnormal   Collection Time: 11/29/15  3:20 PM  Result Value Ref Range   Glucose-Capillary 276 (H) 65 - 99 mg/dL  Glucose, capillary     Status: Abnormal   Collection Time: 11/29/15  4:16 PM  Result Value Ref Range   Glucose-Capillary 271 (H) 65 - 99 mg/dL   Dg Chest 2 View  11/29/2015  CLINICAL DATA:  Preop, gangrene of foot EXAM: CHEST  2 VIEW COMPARISON:  09/11/2014 FINDINGS: Cardiomediastinal silhouette is unremarkable. No acute infiltrate or pleural effusion. No pulmonary edema. Mild degenerative changes thoracic spine. Atherosclerotic calcifications of abdominal aorta. IMPRESSION: No active cardiopulmonary disease. Electronically Signed   By: Lahoma Crocker M.D.   On: 11/29/2015 13:20    Review of Systems  All other systems reviewed and are negative.   Blood pressure 183/61, pulse 95, temperature 100 F (37.8 C), temperature source Oral, resp. rate 14, weight 54.885 kg (121 lb), SpO2 100 %. Physical Exam  On examination patient has dry mummified left great toe with black gangrene involving the entire foot. Assessment/Plan Assessment: Diabetic insensate neuropathy severe peripheral vascular disease with dry gangrene involving the entire left foot.  Plan: We will plan for left transtibial amputation. Risk and benefits were discussed including the risk of the wound not healing. Patient states she understands and wishes to proceed at this time.  Newt Minion, MD 11/29/2015, 5:19 PM

## 2015-11-29 NOTE — Op Note (Signed)
   Date of Surgery: 11/29/2015  INDICATIONS: Carolyn Valenzuela is a 65 y.o.-year-old female who has severe peripheral vascular disease with gangrene involving the left foot with mummified gangrene of the left great toe.  PREOPERATIVE DIAGNOSIS: Dry gangrene Wagner grade 5 ulceration left foot  POSTOPERATIVE DIAGNOSIS: Same.  PROCEDURE: Transtibial amputation Application of Prevena wound VAC  SURGEON: Sharol Given, M.D.  ANESTHESIA:  general  IV FLUIDS AND URINE: See anesthesia.  ESTIMATED BLOOD LOSS: Minimal mL.  COMPLICATIONS: None.  DESCRIPTION OF PROCEDURE: The patient was brought to the operating room and underwent a general anesthetic. After adequate levels of anesthesia were obtained patient's lower extremity was prepped using DuraPrep draped into a sterile field. A timeout was called. The foot was draped out of the sterile field with impervious stockinette. A transverse incision was made 11 cm distal to the tibial tubercle. This curved proximally and a large posterior flap was created. The tibia was transected 1 cm proximal to the skin incision. The fibula was transected just proximal to the tibial incision. The tibia was beveled anteriorly. A large posterior flap was created. The sciatic nerve was pulled cut and allowed to retract. The vascular bundles were suture ligated with 2-0 silk. The deep and superficial fascial layers were closed using #1 Vicryl. The skin was closed using staples and 2-0 nylon. The wound was covered with a Prevena wound VAC. There was a good suction fit. Patient was extubated taken to the PACU in stable condition.  Carolyn Score, MD [place] 5:17 PM

## 2015-11-29 NOTE — Anesthesia Procedure Notes (Signed)
Procedure Name: LMA Insertion Date/Time: 11/29/2015 4:33 PM Performed by: Luciana Axe K Pre-anesthesia Checklist: Patient identified, Emergency Drugs available, Suction available and Patient being monitored Patient Re-evaluated:Patient Re-evaluated prior to inductionOxygen Delivery Method: Circle System Utilized Preoxygenation: Pre-oxygenation with 100% oxygen Intubation Type: IV induction Ventilation: Mask ventilation without difficulty LMA: LMA inserted LMA Size: 4.0 Number of attempts: 1 Placement Confirmation: positive ETCO2 Tube secured with: Tape Dental Injury: Teeth and Oropharynx as per pre-operative assessment

## 2015-11-30 ENCOUNTER — Other Ambulatory Visit (HOSPITAL_COMMUNITY): Payer: Self-pay | Admitting: Family

## 2015-11-30 LAB — BASIC METABOLIC PANEL
Anion gap: 17 — ABNORMAL HIGH (ref 5–15)
BUN: 15 mg/dL (ref 6–20)
CO2: 25 mmol/L (ref 22–32)
Calcium: 8.8 mg/dL — ABNORMAL LOW (ref 8.9–10.3)
Chloride: 94 mmol/L — ABNORMAL LOW (ref 101–111)
Creatinine, Ser: 4.19 mg/dL — ABNORMAL HIGH (ref 0.44–1.00)
GFR calc Af Amer: 12 mL/min — ABNORMAL LOW (ref >60)
GFR calc non Af Amer: 10 mL/min — ABNORMAL LOW (ref >60)
Glucose, Bld: 118 mg/dL — ABNORMAL HIGH (ref 65–99)
Potassium: 4.2 mmol/L (ref 3.5–5.1)
Sodium: 136 mmol/L (ref 135–145)

## 2015-11-30 LAB — GLUCOSE, CAPILLARY
Glucose-Capillary: 108 mg/dL — ABNORMAL HIGH (ref 65–99)
Glucose-Capillary: 105 mg/dL — ABNORMAL HIGH (ref 65–99)
Glucose-Capillary: 131 mg/dL — ABNORMAL HIGH (ref 65–99)
Glucose-Capillary: 76 mg/dL (ref 65–99)

## 2015-11-30 MED ORDER — HYDROMORPHONE HCL 1 MG/ML IJ SOLN
INTRAMUSCULAR | Status: AC
  Administered 2015-11-30: 1 mg via INTRAVENOUS
  Filled 2015-11-30: qty 1

## 2015-11-30 NOTE — Evaluation (Signed)
Physical Therapy Evaluation Patient Details Name: Carolyn Valenzuela MRN: [id number] DOB: 04-23-1951 Today's Date: 11/30/2015   History of Present Illness  Patient with significant PMH of DM on HD and severe PVD, s/p Right BKA in 2015, underwent Left BKA on 11/29/15 for gangrenous left foot  Clinical Impression  Patient now with bilateral BKA limiting independence and ability to ambulate.  Patient with good bed mobility and was functioning independently before admission.  Patient very motivated to return home independently.  Feel patient will benefit from PT to progress mobility and independence.  Feel patient will benefit from inpatient rehab to reach maximum potential for return to home.      Follow Up Recommendations CIR    Equipment Recommendations  None recommended by PT    Recommendations for Other Services Rehab consult     Precautions / Restrictions Precautions Precautions: Fall Required Braces or Orthoses: Other Brace/Splint Other Brace/Splint: has prosthetic for RLE      Mobility  Bed Mobility Overal bed mobility: Modified Independent             General bed mobility comments: able to get to EOB with use of rail and verbal cueing; able to return to sitting in same manner.  Did not transfer as patient very groggy due to pain meds and patient's RLE prosthetic not present.    Transfers                    Ambulation/Gait                Stairs            Wheelchair Mobility    Modified Rankin (Stroke Patients Only)       Balance Overall balance assessment: Needs assistance Sitting-balance support: No upper extremity supported Sitting balance-Leahy Scale: Good                                       Pertinent Vitals/Pain Pain Assessment: 0-10 Pain Score: 7  (after treatment) Pain Location: left LE Pain Descriptors / Indicators: Burning;Discomfort Pain Intervention(s): Limited activity within patient's tolerance;Monitored  during session;Premedicated before session;Repositioned;Relaxation    Home Living Family/patient expects to be discharged to:: Private residence Living Arrangements: Alone Available Help at Discharge: Family;Available PRN/intermittently Type of Home: Apartment Home Access: Ramped entrance     Home Layout: One level Home Equipment: Walker - 2 wheels;Cane - quad;Wheelchair - manual      Prior Function Level of Independence: Independent               Hand Dominance   Dominant Hand: Right    Extremity/Trunk Assessment   Upper Extremity Assessment: Overall WFL for tasks assessed           Lower Extremity Assessment: LLE deficits/detail   LLE Deficits / Details: limited ROM in knee flexion due to pain; otherwise, ROM WFL; strength not tested due to pain but able to move all planes against gravity     Communication   Communication: No difficulties  Cognition Arousal/Alertness: Awake/alert Behavior During Therapy: WFL for tasks assessed/performed Overall Cognitive Status: Within Functional Limits for tasks assessed                      General Comments      Exercises Amputee Exercises Hip ABduction/ADduction: AROM;Left;5 reps;Supine Knee Flexion: AROM;Left;5 reps;Supine Knee Extension: AROM;Left;5 reps;Seated Straight Leg Raises: AROM;Left;5  reps;Supine      Assessment/Plan    PT Assessment Patient needs continued PT services  PT Diagnosis Difficulty walking;Acute pain   PT Problem List Decreased strength;Decreased range of motion;Decreased activity tolerance;Decreased mobility;Decreased knowledge of use of DME;Pain  PT Treatment Interventions DME instruction;Gait training;Functional mobility training;Therapeutic activities;Therapeutic exercise;Balance training;Patient/family education   PT Goals (Current goals can be found in the Care Plan section) Acute Rehab PT Goals Patient Stated Goal: find my prosthesis PT Goal Formulation: With patient Time  For Goal Achievement: 12/07/15 Potential to Achieve Goals: Good    Frequency Min 4X/week   Barriers to discharge Decreased caregiver support      Co-evaluation               End of Session   Activity Tolerance: Patient tolerated treatment well;Patient limited by lethargy Patient left: in bed;with call bell/phone within reach Nurse Communication: Patient requests pain meds         Time: 1050-1109 PT Time Calculation (min) (ACUTE ONLY): 19 min   Charges:   PT Evaluation $PT Eval Moderate Complexity: 1 Procedure     PT G CodesShanna Cisco 11/30/2015, 11:21 AM  11/30/2015 [place], [place]

## 2015-11-30 NOTE — Progress Notes (Addendum)
Rehab Admissions Coordinator Note:  Patient was screened by Cleatrice Burke for appropriateness for an Inpatient Acute Rehab Consult per PT recommendation.   At this time, we are recommending an inpt rehab consult as well as OT consult. Please place order if you are in agreement. [employer] will have to approve any rehab venue. Pt previously was admitted to inpt rehab after first amputation 2015.  Cleatrice Burke 11/30/2015, 11:33 AM  I can be reached at [phone]

## 2015-11-30 NOTE — Progress Notes (Signed)
Subjective:  Some post discomfort/ tolerated HD late last night.   Objective Vital signs in last 24 hours: Filed Vitals:   11/30/15 0600 11/30/15 0612 11/30/15 1032 11/30/15 1033  BP: 117/55 122/56 122/58 122/58  Pulse: 88 88    Temp:  97.7 F (36.5 C)    TempSrc:  Oral    Resp: 16 16    Height:      Weight:      SpO2:       Weight change:  Physical Exam: General: Thin,chronically ill AAF   NAD/ OX3 Lungs: CTA  unlabored. Heart: RRR  No murmurs, rubs, or gallops appreciated. Abdomen: Soft, non-tender, non-distended . Lower extremities:  L  Transtibial Amp. Site with wound vac / R BKA . [name] Dialysis Access: LUA AVF +thrill + bruit  Dialysis Orders: Center: [facility] on MWF . 4 hrs 0 min, 180NRe Optiflux, BFR 400, DFR Autoflow 1.5, EDW 52 (kg), Dialysate 3.0 K, 2.25 Ca, UFR Profile: Profile 4, Sodium Model: None, Access: LUA AV Fistula Heparin: 2500 Units per treatment Mircera: 50 mcg IV q 2 weeks (11/13/15) Hectoral: 4 mcg IV Q TTS (last dose 11/25/15)  Problem/Plan: 1. Necrotic L Great Toe/severe PVD: now L BKA per Dr Sharol Given 11/29/15  Will need Rehab/snh  Lived alone at home 2. ESRD - [place] TTS will have HD late last pm  Fu labs  No heparin.  3. Hypertension/volume - Current wt 50.5 below  EDW.  With hd last night.  bp 122/56 better this am. Usually takes amlodipine, coreg and isosorbide mononitrate .  4. Anemia - HGB 10.2.> 8.2 post op  ESA  incr 50 to 100  On hd  follow CBC.  5. Metabolic bone disease - phos 8.1= on Renvela  As binder Cont  and VDRA. 6. Nutrition - Last in-center Albumin 3.5. NPO at present. Renal carb mod  diet, renal vit and nepro when able to eat. 7. DM: per primary.   Ernest Haber, PA-C [facility] [phone] 11/30/2015,11:39 AM  LOS: 1 day   Labs: Basic Metabolic Panel:  Recent Labs Lab 11/25/15 2021 11/29/15 1303 11/29/15 2250  NA 138 131* 135  K 4.9 5.5* 6.4*  CL 95*  --  97*  CO2 27  --  20*  GLUCOSE 186* 348* 232*   BUN 16  --  65*  CREATININE 3.43*  --  11.69*  CALCIUM 9.4  --  8.4*  PHOS  --   --  8.1*   Liver Function Tests:  Recent Labs Lab 11/29/15 2250  ALBUMIN 2.7*   No results for input(s): LIPASE, AMYLASE in the last 168 hours. No results for input(s): AMMONIA in the last 168 hours. CBC:  Recent Labs Lab 11/25/15 2021 11/29/15 1303 11/29/15 2250  WBC 10.0  --  10.2  NEUTROABS 7.2  --   --   HGB 10.3* 10.2* 8.4*  HCT 32.1* 30.0* 25.0*  MCV 94.1  --  92.9  PLT 366  --  354   Cardiac Enzymes: No results for input(s): CKTOTAL, CKMB, CKMBINDEX, TROPONINI in the last 168 hours. CBG:  Recent Labs Lab 11/29/15 1520 11/29/15 1616 11/29/15 1724 11/29/15 2120 11/30/15 0908  GLUCAP 276* 271* 241* 207* 108*    Studies/Results: Dg Chest 2 View  11/29/2015  CLINICAL DATA:  Preop, gangrene of foot EXAM: CHEST  2 VIEW COMPARISON:  09/11/2014 FINDINGS: Cardiomediastinal silhouette is unremarkable. No acute infiltrate or pleural effusion. No pulmonary edema. Mild degenerative changes thoracic spine. Atherosclerotic calcifications  of abdominal aorta. IMPRESSION: No active cardiopulmonary disease. Electronically Signed   By: Lahoma Crocker M.D.   On: 11/29/2015 13:20   Medications: . sodium chloride 10 mL/hr at 11/29/15 2230  . sodium chloride 10 mL/hr at 11/29/15 2230   . amLODipine  10 mg Oral Daily  .  ceFAZolin (ANCEF) IV  1 g Intravenous 4 times per day  . cloNIDine  0.1 mg Oral BID  . docusate sodium  100 mg Oral BID  . furosemide  10 mg Oral Daily  . gabapentin  100 mg Oral BID  . glipiZIDE  5 mg Oral Q breakfast  . insulin aspart  0-9 Units Subcutaneous TID WC  . sevelamer carbonate  1,600 mg Oral TID WC  . simvastatin  20 mg Oral q1800       I have seen and examined this patient and agree with plan as outlined by Ernest Haber, PA-C.  Plan for CIR consult and OT.  Continue HD q MWF. Broadus John A Porshia Blizzard,MD 11/30/2015 12:41 PM  3

## 2015-11-30 NOTE — Anesthesia Postprocedure Evaluation (Signed)
Anesthesia Post Note  Patient: Carolyn Valenzuela  Procedure(s) Performed: Procedure(s) (LRB): AMPUTATION BELOW KNEE (Left)  Patient location during evaluation: PACU Anesthesia Type: General Level of consciousness: awake and alert and patient cooperative Pain management: pain level controlled Vital Signs Assessment: post-procedure vital signs reviewed and stable Respiratory status: spontaneous breathing and respiratory function stable Cardiovascular status: stable Anesthetic complications: no    Last Vitals:  Filed Vitals:   11/30/15 0600 11/30/15 0612  BP: 117/55 122/56  Pulse: 88 88  Temp:  36.5 C  Resp: 16 16    Last Pain:  Filed Vitals:   11/30/15 0657  PainSc: 5                  Ryland Smoots S

## 2015-12-01 LAB — GLUCOSE, CAPILLARY
Glucose-Capillary: 91 mg/dL (ref 65–99)
Glucose-Capillary: 84 mg/dL (ref 65–99)
Glucose-Capillary: 48 mg/dL — ABNORMAL LOW (ref 65–99)
Glucose-Capillary: 46 mg/dL — ABNORMAL LOW (ref 65–99)
Glucose-Capillary: 93 mg/dL (ref 65–99)
Glucose-Capillary: 70 mg/dL (ref 65–99)

## 2015-12-01 MED ORDER — GLUCOSE 40 % PO GEL
ORAL | Status: AC
  Administered 2015-12-01: 37.5 g
  Filled 2015-12-01: qty 1

## 2015-12-01 NOTE — Progress Notes (Signed)
Patient ID: Carolyn Valenzuela, female   DOB: 07-29-1951, 65 y.o.   MRN: [id number]  [facility] Progress Note    Subjective:   Feeling better   Objective:   BP 108/45 mmHg  Pulse 83  Temp(Src) 98.9 F (37.2 C) (Oral)  Resp 18  Ht 4\' 11"  (1.499 m)  Wt 50.5 kg (111 lb 5.3 oz)  BMI 22.47 kg/m2  SpO2 100%  Intake/Output: I/O last 3 completed shifts: In: 1200 [P.O.:1200] Out: 1193 [Other:1193]   Intake/Output this shift:  Total I/O In: 120 [P.O.:120] Out: -  Weight change:   Physical Exam: Gen:NAD CVS:no rub Resp:cta [id number] Ext:s/p bilateral BKA's, LBKA with wound vac in place, LUE AVF +T/B  Labs: BMET  Recent Labs Lab 11/25/15 2021 11/29/15 1303 11/29/15 2250 11/30/15 1156  NA 138 131* 135 136  K 4.9 5.5* 6.4* 4.2  CL 95*  --  97* 94*  CO2 27  --  20* 25  GLUCOSE 186* 348* 232* 118*  BUN 16  --  65* 15  CREATININE 3.43*  --  11.69* 4.19*  ALBUMIN  --   --  2.7*  --   CALCIUM 9.4  --  8.4* 8.8*  PHOS  --   --  8.1*  --    CBC  Recent Labs Lab 11/25/15 2021 11/29/15 1303 11/29/15 2250  WBC 10.0  --  10.2  NEUTROABS 7.2  --   --   HGB 10.3* 10.2* 8.4*  HCT 32.1* 30.0* 25.0*  MCV 94.1  --  92.9  PLT 366  --  354    @IMGRELPRIORS @ Medications:    . amLODipine  10 mg Oral Daily  . cloNIDine  0.1 mg Oral BID  . docusate sodium  100 mg Oral BID  . furosemide  10 mg Oral Daily  . gabapentin  100 mg Oral BID  . glipiZIDE  5 mg Oral Q breakfast  . insulin aspart  0-9 Units Subcutaneous TID WC  . sevelamer carbonate  1,600 mg Oral TID WC  . simvastatin  20 mg Oral q1800   Dialysis Orders: Center: [facility] on MWF . 4 hrs 0 min, 180NRe Optiflux, BFR 400, DFR Autoflow 1.5, EDW 52kg (will need new lower edw post BKA), Dialysate 3.0 K, 2.25 Ca, UFR Profile: Profile 4, Sodium Model: None, Access: LUA AV Fistula Heparin: 2500 Units per treatment Mircera: 50 mcg IV q 2 weeks (11/13/15) Hectoral: 4 mcg IV Q TTS (last dose  11/25/15)  Assessment/ Plan:   1. Necrotic L Great Toe/severe PVD: now L BKA per Dr Sharol Given 11/29/15 Will need Rehab/snh Lived alone at home 2. ESRD - [facility] MWF will have HD late last pm Fu labs tight heparin. Will have new lower edw 3. Hypertension/volume - Current wt 50.5 below EDW. With hd last night. bp 122/56 better this am. Usually takes amlodipine, coreg and isosorbide mononitrate .  4. Anemia - HGB 10.2.> 8.2 post op ESA incr 50 to 100 On hd follow CBC.  5. Metabolic bone disease - phos 8.1= on Renvela As binder Cont and VDRA. 6. Nutrition - Last in-center Albumin 3.5. NPO at present. Renal carb mod diet, renal vit and nepro when able to eat. 7. DM: per primary. 8. Disposition:  For CIR consult and OT Donetta Potts, MD [place] Pager [phone] 12/01/2015, 10:13 AM

## 2015-12-01 NOTE — Progress Notes (Signed)
Patient ID: Carolyn Valenzuela, female   DOB: 01-05-1951, 65 y.o.   MRN: [id number] Postoperative day 2 transtibial amputation. There appears recommended evaluation for inpatient rehabilitation. Orders are written for occupational therapy consult and inpatient rehabilitation consult.

## 2015-12-01 NOTE — Progress Notes (Signed)
Hypoglycemic Event  CBG: 48  Treatment: 1 tube instant glucose  Symptoms: None  Follow-up CBG: Time:1749 CBG Result:93  Possible Reasons for Event: Unknown  Comments/MD notified:hypoglycemic proctol    Sharvi Mooneyhan, Tivis Ringer

## 2015-12-02 ENCOUNTER — Inpatient Hospital Stay (HOSPITAL_COMMUNITY): Payer: BC Managed Care – PPO

## 2015-12-02 ENCOUNTER — Encounter (HOSPITAL_COMMUNITY): Payer: Self-pay | Admitting: Orthopedic Surgery

## 2015-12-02 DIAGNOSIS — E1142 Type 2 diabetes mellitus with diabetic polyneuropathy: Secondary | ICD-10-CM | POA: Insufficient documentation

## 2015-12-02 DIAGNOSIS — D638 Anemia in other chronic diseases classified elsewhere: Secondary | ICD-10-CM | POA: Insufficient documentation

## 2015-12-02 DIAGNOSIS — Z89511 Acquired absence of right leg below knee: Secondary | ICD-10-CM

## 2015-12-02 DIAGNOSIS — D62 Acute posthemorrhagic anemia: Secondary | ICD-10-CM | POA: Insufficient documentation

## 2015-12-02 DIAGNOSIS — Z89512 Acquired absence of left leg below knee: Secondary | ICD-10-CM

## 2015-12-02 DIAGNOSIS — I10 Essential (primary) hypertension: Secondary | ICD-10-CM

## 2015-12-02 DIAGNOSIS — G8918 Other acute postprocedural pain: Secondary | ICD-10-CM

## 2015-12-02 DIAGNOSIS — N186 End stage renal disease: Secondary | ICD-10-CM

## 2015-12-02 DIAGNOSIS — R7309 Other abnormal glucose: Secondary | ICD-10-CM

## 2015-12-02 LAB — GLUCOSE, CAPILLARY
Glucose-Capillary: 54 mg/dL — ABNORMAL LOW (ref 65–99)
Glucose-Capillary: 113 mg/dL — ABNORMAL HIGH (ref 65–99)
Glucose-Capillary: 58 mg/dL — ABNORMAL LOW (ref 65–99)
Glucose-Capillary: 83 mg/dL (ref 65–99)
Glucose-Capillary: 81 mg/dL (ref 65–99)
Glucose-Capillary: 80 mg/dL (ref 65–99)
Glucose-Capillary: 49 mg/dL — ABNORMAL LOW (ref 65–99)
Glucose-Capillary: 91 mg/dL (ref 65–99)
Glucose-Capillary: 97 mg/dL (ref 65–99)
Glucose-Capillary: 75 mg/dL (ref 65–99)

## 2015-12-02 LAB — RENAL FUNCTION PANEL
Albumin: 2.4 g/dL — ABNORMAL LOW (ref 3.5–5.0)
Anion gap: 14 (ref 5–15)
BUN: 39 mg/dL — ABNORMAL HIGH (ref 6–20)
CO2: 26 mmol/L (ref 22–32)
Calcium: 8 mg/dL — ABNORMAL LOW (ref 8.9–10.3)
Chloride: 88 mmol/L — ABNORMAL LOW (ref 101–111)
Creatinine, Ser: 7.88 mg/dL — ABNORMAL HIGH (ref 0.44–1.00)
GFR calc Af Amer: 6 mL/min — ABNORMAL LOW (ref >60)
GFR calc non Af Amer: 5 mL/min — ABNORMAL LOW (ref >60)
Glucose, Bld: 39 mg/dL — CL (ref 65–99)
Phosphorus: 6.7 mg/dL — ABNORMAL HIGH (ref 2.5–4.6)
Potassium: 4.9 mmol/L (ref 3.5–5.1)
Sodium: 128 mmol/L — ABNORMAL LOW (ref 135–145)

## 2015-12-02 LAB — CBC
HCT: 22.5 % — ABNORMAL LOW (ref 36.0–46.0)
Hemoglobin: 7.2 g/dL — ABNORMAL LOW (ref 12.0–15.0)
MCH: 29.6 pg (ref 26.0–34.0)
MCHC: 32 g/dL (ref 30.0–36.0)
MCV: 92.6 fL (ref 78.0–100.0)
Platelets: 381 10*3/uL (ref 150–400)
RBC: 2.43 MIL/uL — ABNORMAL LOW (ref 3.87–5.11)
RDW: 15.3 % (ref 11.5–15.5)
WBC: 9.9 10*3/uL (ref 4.0–10.5)

## 2015-12-02 MED ORDER — HEPARIN SODIUM (PORCINE) 1000 UNIT/ML DIALYSIS: 20.0000 [IU]/kg | INTRAMUSCULAR | Status: DC | PRN

## 2015-12-02 MED ORDER — DEXTROSE-NACL 5-0.9 % IV SOLN
INTRAVENOUS | Status: AC
  Administered 2015-12-03: 01:00:00 via INTRAVENOUS

## 2015-12-02 MED ORDER — GABAPENTIN 300 MG PO CAPS
300.0000 mg | ORAL_CAPSULE | Freq: Two times a day (BID) | ORAL | Status: DC
  Administered 2015-12-02 – 2015-12-03 (×2): 300 mg via ORAL
  Filled 2015-12-02 (×2): qty 1

## 2015-12-02 MED ORDER — GLUCOSE 40 % PO GEL
ORAL | Status: AC
  Filled 2015-12-02: qty 1

## 2015-12-02 MED ORDER — OXYCODONE-ACETAMINOPHEN 5-325 MG PO TABS: 1.0000 | ORAL_TABLET | ORAL | Status: DC | PRN

## 2015-12-02 MED ORDER — GLUCOSE 40 % PO GEL
1.0000 | Freq: Once | ORAL | Status: AC
  Administered 2015-12-02: 37.5 g via ORAL

## 2015-12-02 MED ORDER — DARBEPOETIN ALFA 100 MCG/0.5ML IJ SOSY
100.0000 ug | PREFILLED_SYRINGE | INTRAMUSCULAR | Status: DC
  Administered 2015-12-04: 100 ug via INTRAVENOUS
  Filled 2015-12-02: qty 0.5

## 2015-12-02 MED ORDER — NEPRO/CARBSTEADY PO LIQD
237.0000 mL | Freq: Three times a day (TID) | ORAL | Status: DC
  Administered 2015-12-02 – 2015-12-04 (×3): 237 mL via ORAL
  Filled 2015-12-02 (×10): qty 237

## 2015-12-02 NOTE — Progress Notes (Signed)
Physical Therapy Note Patient is gradually progressing toward mobility goals. R LE prosthesis donned for transfer training. Continue to progress as tolerated.    12/02/15 1311  PT Visit Information  Last PT Received On 12/02/15  PT/OT/SLP Co-Evaluation/Treatment Yes  Reason for Co-Treatment For patient/therapist safety;Complexity of the patient's impairments (multi-system involvement)  PT goals addressed during session Mobility/safety with mobility;Balance;Proper use of DME  PT Time Calculation  PT Start Time (ACUTE ONLY) 0933  PT Stop Time (ACUTE ONLY) 1010  PT Time Calculation (min) (ACUTE ONLY) 37 min  Subjective Data  Patient Stated Goal to be able to take care of myself again  Precautions  Precaution Comments wound vac  Other Brace/Splint has prosthetic for RLE  Restrictions  Weight Bearing Restrictions Yes  LLE Weight Bearing NWB  Pain Assessment  Pain Assessment No/denies pain  Pain Intervention(s) Monitored during session;Premedicated before session  Cognition  Arousal/Alertness Awake/alert  Behavior During Therapy WFL for tasks assessed/performed  Overall Cognitive Status Within Functional Limits for tasks assessed  Bed Mobility  Overal bed mobility Needs Assistance  Bed Mobility Supine to Sit  Supine to sit Max assist;HOB elevated  General bed mobility comments cues for technique and sequencing with HOB elevated and use of bedrails; assist to scoot hips to EOB with use of bed pad  Transfers  Overall transfer level Needs assistance  Equipment used Rolling walker (2 wheeled);2 person hand held assist  Transfers Sit to/from [employer] Transfers  Sit to Stand Mod assist;Max assist;+2 physical assistance;+2 safety/equipment  Stand pivot transfers Max assist;+2 physical assistance;+2 safety/equipment  General transfer comment R LE prosthesis donned; pmod A to power up into standing and max A to maintain balance upon stand X2 with RW from EOB; max A stand pivot HHA  +2; Cues for hand placement and technique with pt unable to achieve erect posture and with difficulty finding COG with stands  Balance  Overall balance assessment Needs assistance  Sitting-balance support Feet supported;Bilateral upper extremity supported  Sitting balance-Leahy Scale Fair  Sitting balance - Comments posterior lean initially   Standing balance support Bilateral upper extremity supported  Standing balance-Leahy Scale Zero  Exercises  Exercises Amputee  Amputee Exercises  Straight Leg Raises AROM;Left;10 reps;Supine  PT - End of Session  Equipment Utilized During Treatment Gait belt;Other (comment) (R LE prosthesis)  Activity Tolerance Patient tolerated treatment well  Patient left in chair;with call bell/phone within reach;with chair alarm set  Nurse Communication Mobility status;Need for lift equipment  PT - Assessment/Plan  PT Plan Current plan remains appropriate  PT Frequency (ACUTE ONLY) Min 4X/week  Recommendations for Other Services Rehab consult  Follow Up Recommendations CIR  PT equipment None recommended by PT  PT Goal Progression  Progress towards PT goals Progressing toward goals  PT General Charges  $$ ACUTE PT VISIT 1 Procedure  PT Treatments  $Therapeutic Activity 23-37 mins  Earney Navy, PTA Pager: [phone]

## 2015-12-02 NOTE — Progress Notes (Signed)
Dr. Sharol Given made aware that pt will not be going to CIR today.  [place], [name]

## 2015-12-02 NOTE — Progress Notes (Signed)
Finger stick blood sugar is 57 this morning. Patient is alert, oriented and can make needs known. Communicated with MD Sharol Given and he ordered to give [employer], hold AM glipizide and reheck blood sugar in an hour.

## 2015-12-02 NOTE — Evaluation (Signed)
Occupational Therapy Evaluation Patient Details Name: Carolyn Valenzuela MRN: [id number] DOB: 1951-07-09 Today's Date: 12/02/2015    History of Present Illness Patient with significant PMH of DM on HD and severe PVD, s/p Right BKA in 2015, underwent Left BKA on 11/29/15 for gangrenous left foot   Clinical Impression   This 65 yo female admitted with above presents to acute OT with deficits below affecting her ability to care for herself as she was pta. She will benefit from acute OT with follow up OT on CIR to get to a Mod I-W/C level.    Follow Up Recommendations  CIR    Equipment Recommendations   (TBD next venue)       Precautions / Restrictions Precautions Precautions: Fall Precaution Comments: wound vac Required Braces or Orthoses: Other Brace/Splint Other Brace/Splint: has prosthetic for RLE Restrictions Weight Bearing Restrictions: Yes LLE Weight Bearing: Non weight bearing      Mobility Bed Mobility Overal bed mobility: Needs Assistance Bed Mobility: Supine to Sit     Supine to sit: Max assist;HOB elevated (use of rail)        Transfers Overall transfer level: Needs assistance   Transfers: Stand Pivot Transfers   Stand pivot transfers: Max assist;+2 physical assistance            Balance Overall balance assessment: Needs assistance Sitting-balance support:  (LLE prothestic supported) Sitting balance-Leahy Scale: Fair Sitting balance - Comments: posterior lean intermittently (mainly due to drowsiness)                                    ADL Overall ADL's : Needs assistance/impaired Eating/Feeding: Independent;Bed level   Grooming: Set up;Supervision/safety;Sitting (in recliner)   Upper Body Bathing: Set up;Supervision/ safety;Sitting (in recliner)   Lower Body Bathing: Total assistance (in recliner)   Upper Body Dressing : Maximal assistance;Sitting (in recliner)   Lower Body Dressing: Total assistance;Bed level   Toilet  Transfer: +2 for physical assistance;Maximal assistance;Stand-pivot (bed>recliner)   Toileting- Clothing Manipulation and Hygiene: Total assistance;+2 for physical assistance;Sit to/from stand         General ADL Comments: Min A to don prothesis               Pertinent Vitals/Pain Pain Assessment: Faces Faces Pain Scale: Hurts little more Pain Location: LLE Pain Descriptors / Indicators: Grimacing;Guarding Pain Intervention(s): Monitored during session;Premedicated before session;Repositioned     Hand Dominance Right   Extremity/Trunk Assessment Upper Extremity Assessment Upper Extremity Assessment: Generalized weakness           Communication Communication Communication: No difficulties   Cognition Arousal/Alertness: Awake/alert Behavior During Therapy: WFL for tasks assessed/performed Overall Cognitive Status: Within Functional Limits for tasks assessed                                Home Living Family/patient expects to be discharged to:: Private residence Living Arrangements: Alone Available Help at Discharge:  (none) Type of Home: Apartment Home Access: Ramped entrance     Home Layout: One level     Bathroom Shower/Tub: Tub/shower unit;Curtain Shower/tub characteristics: Architectural technologist: Standard     Home Equipment: Environmental consultant - 2 wheels;Cane - quad;Wheelchair - manual          Prior Functioning/Environment Level of Independence: Independent             OT Diagnosis: Generalized  weakness;Acute pain   OT Problem List: Decreased strength;Decreased range of motion;Decreased activity tolerance;Impaired balance (sitting and/or standing);Pain;Decreased knowledge of use of DME or AE   OT Treatment/Interventions: Self-care/ADL training;Patient/family education;Balance training;Therapeutic activities;DME and/or AE instruction    OT Goals(Current goals can be found in the care plan section) Acute Rehab OT Goals Patient Stated  Goal: to be able to take care of myself again OT Goal Formulation: With patient Time For Goal Achievement: 12/16/15 Potential to Achieve Goals: Good  OT Frequency: Min 3X/week   Barriers to D/C: Decreased caregiver support          Co-evaluation PT/OT/SLP Co-Evaluation/Treatment: Yes Reason for Co-Treatment: For patient/therapist safety;Complexity of the patient's impairments (multi-system involvement)   OT goals addressed during session: ADL's and self-care;Strengthening/ROM      End of Session Equipment Utilized During Treatment: Gait belt;Rolling walker Nurse Communication: Mobility status  Activity Tolerance: Patient limited by lethargy Patient left: in chair;with call bell/phone within reach;with chair alarm set   Time: [id number] OT Time Calculation (min): 30 min Charges:  OT General Charges $OT Visit: 1 Procedure OT Evaluation $OT Eval Low Complexity: 1 Procedure  Almon Register [id number] 12/02/2015, 12:35 PM

## 2015-12-02 NOTE — Consult Note (Signed)
Physical Medicine and Rehabilitation Consult Reason for Consult: Left transtibial amputation Referring Physician: Dr. Sharol Given  HPI: Carolyn Valenzuela is a 65 y.o. right handed female with history of hypertension, diabetes mellitus and peripheral neuropathy, end-stage renal disease with hemodialysis, right BKA July 2015 and received inpatient rehabilitation services and discharged to home ambulating short distances with a rolling walker. Patient does have a prosthesis for right lower extremity provided by [employer]. Patient lives alone one level apartment with ramped entrance. Siblings in the area check as needed. Presented 11/29/2015 with dry gangrenous left foot. No change with conservative care. Underwent left transtibial amputation 11/29/2015 per Dr. Sharol Given. Hospital course pain management. Hemodialysis ongoing as per renal services. Acute on chronic anemia hemoglobin 8.4. Physical therapy evaluation completed with recommendations of physical medicine rehabilitation consult.  Review of Systems  Constitutional: Negative for fever and chills.  HENT: Negative for hearing loss.   Eyes: Negative for blurred vision and double vision.  Respiratory: Negative for cough.        Shortness of breath with exertion  Cardiovascular: Positive for leg swelling. Negative for chest pain and palpitations.  Gastrointestinal: Positive for nausea and constipation. Negative for vomiting.  Musculoskeletal: Positive for myalgias and back pain.  Skin: Negative for rash.  Neurological: Positive for weakness. Negative for seizures and headaches.  All other systems reviewed and are negative.  Past Medical History  Diagnosis Date  . DIABETES MELLITUS, UNCONTROLLED 05/21/2009  . HYPERLIPIDEMIA 03/30/2007  . ANEMIA-IRON DEFICIENCY 01/25/2008  . HYPERTENSION 01/25/2008  . SINUSITIS- ACUTE-NOS 01/25/2008  . SECONDARY HYPERPARATHYROIDISM 05/21/2009  . CERVICAL RADICULOPATHY, LEFT 01/25/2008  . BACK PAIN 05/02/2009    . NUMBNESS 05/21/2009  . Allergic rhinitis, cause unspecified 12/08/2010  . Foot ulcer ([place])     right lateral malleolus  . Critical lower limb ischemia   . RENAL INSUFFICIENCY 02/01/2008    HD pt.   Past Surgical History  Procedure Laterality Date  . Av fistula placement Left   . Eye surgery Bilateral     cataracts removed, left eye still has some oil in it.  . Colonoscopy    . Amputation Right 03/15/2014    Procedure: RIGHT  LEG  BELOW KNEE AMPUTATION ;  Surgeon: [name], MD;  Location: [facility];  Service: Orthopedics;  Laterality: Right;  . Insertion of dialysis catheter N/A 09/03/2014    Procedure: INSERTION OF DIALYSIS CATHETER RIGHT INTERNAL JUGULAR VEIN;  Surgeon: Conrad Marlinton, MD;  Location: [facility];  Service: Vascular;  Laterality: N/A;  . Av fistula placement Left 09/06/2014    Procedure: ARTERIOVENOUS (AV) FISTULA CREATION-Left Brachiocephalic;  Surgeon: Conrad [place], MD;  Location: [facility];  Service: Vascular;  Laterality: Left;  . Peripheral vascular catheterization N/A 08/13/2015    Procedure: Abdominal Aortogram;  Surgeon: Elam Dutch, MD;  Location: [facility];  Service: Cardiovascular;  Laterality: N/A;   Family History  Problem Relation Age of Onset  . Cancer Mother     Pancreatic   Social History:  reports that she has never smoked. She has never used smokeless tobacco. She reports that she does not drink alcohol or use illicit drugs. Allergies:  Allergies  Allergen Reactions  . Oxycodone Itching   Medications Prior to Admission  Medication Sig Dispense Refill  . acetaminophen (TYLENOL) 325 MG tablet Take 2 tablets (650 mg total) by mouth every 6 (six) hours as needed for mild pain, moderate pain or fever.    [name] amLODipine (  NORVASC) 10 MG tablet TAKE 1 TABLET BY MOUTH EVERY DAY 30 tablet 5  . aspirin 81 MG EC tablet Take 81 mg by mouth daily.      . cloNIDine (CATAPRES) 0.1 MG tablet TAKE 1 TABLET BY MOUTH TWICE A DAY 60 tablet 5  . furosemide (LASIX) 40 MG  tablet TAKE 2 TABLETS BY MOUTH EVERY MORNING AND TAKE 1 TABLET EVERY EVENING 90 tablet 3  . gabapentin (NEURONTIN) 100 MG capsule Take 1 capsule by mouth 2 (two) times daily.  3  . glipiZIDE (GLUCOTROL XL) 5 MG 24 hr tablet Take 1 tablet (5 mg total) by mouth daily with breakfast. 90 tablet 3  . HYDROcodone-acetaminophen (NORCO/VICODIN) 5-325 MG tablet TAKE 1 TABLET 2 TO 3 TIMES A DAY AS NEEDED FOR PAIN  0  . HYDROmorphone (DILAUDID) 2 MG tablet Take 1 tablet (2 mg total) by mouth every 6 (six) hours as needed for severe pain. 15 tablet 0  . Multiple Vitamin (MULTIVITAMIN WITH MINERALS) TABS tablet Take 1 tablet by mouth daily.    . nitroGLYCERIN (NITRODUR - DOSED IN MG/24 HR) 0.2 mg/hr patch APPLY 1 PATCH DAILY. REMOVE OLD PATCH DAILY. CHANGE PATCH LOCATION DAILY.  3  . oxyCODONE-acetaminophen (PERCOCET/ROXICET) 5-325 MG tablet Take 1 tablet by mouth every 6 (six) hours as needed for moderate pain or severe pain.    [name] RENVELA 800 MG tablet Take 1,600 mg by mouth 3 (three) times daily with meals.     . simvastatin (ZOCOR) 20 MG tablet Take 1 tablet (20 mg total) by mouth daily at 6 PM. 90 tablet 3    Home: Home Living Family/patient expects to be discharged to:: Private residence Living Arrangements: Alone Available Help at Discharge: Family, Available PRN/intermittently Type of Home: Apartment Home Access: Ramped entrance Home Layout: One level Home Equipment: Environmental consultant - 2 wheels, [employer] - quad, Wheelchair - manual  Functional History: Prior Function Level of Independence: Independent Functional Status:  Mobility: Bed Mobility Overal bed mobility: Modified Independent General bed mobility comments: able to get to EOB with use of rail and verbal cueing; able to return to sitting in same manner.  Did not transfer as patient very groggy due to pain meds and patient's RLE prosthetic not present.          ADL:    Cognition: Cognition Overall Cognitive Status: Within Functional Limits  for tasks assessed Orientation Level: Oriented X4 Cognition Arousal/Alertness: Awake/alert Behavior During Therapy: WFL for tasks assessed/performed Overall Cognitive Status: Within Functional Limits for tasks assessed  Blood pressure 124/47, pulse 95, temperature 100 F (37.8 C), temperature source Oral, resp. rate 16, height 4\' 11"  (1.499 m), weight 50.5 kg (111 lb 5.3 oz), SpO2 93 %. Physical Exam  Vitals reviewed. Constitutional: She is oriented to person, place, and time. She appears well-developed and well-nourished.  HENT:  Head: Normocephalic and atraumatic.  Eyes: Conjunctivae and EOM are normal.  Neck: Normal range of motion. Neck supple. No thyromegaly present.  Cardiovascular: Normal rate and regular rhythm.   Left upper extremity fistula  Respiratory: Effort normal and breath sounds normal. No respiratory distress.  GI: Soft. Bowel sounds are normal. She exhibits no distension.  Musculoskeletal: She exhibits tenderness. She exhibits no edema.  Bilateral lower extremity BKA  Neurological: She is alert and oriented to person, place, and time.  Motor: Bilateral upper extremities: 4/5 proximal to distal Right lower extremity: BKA, 3/5 hip flexion next line left lower extremity: Hip flexion 3 -/5 (pain inhibition)  Skin:  Left BKA dressed with VAC and appropriately tender. Right BKA well-healed  Psychiatric: She has a normal mood and affect. Her behavior is normal.    Results for orders placed or performed during the hospital encounter of 11/29/15 (from the past 24 hour(s))  Glucose, capillary     Status: None   Collection Time: 12/01/15 12:05 PM  Result Value Ref Range   Glucose-Capillary 84 65 - 99 mg/dL  Glucose, capillary     Status: Abnormal   Collection Time: 12/01/15  4:29 PM  Result Value Ref Range   Glucose-Capillary 48 (L) 65 - 99 mg/dL  Glucose, capillary     Status: Abnormal   Collection Time: 12/01/15  5:01 PM  Result Value Ref Range   Glucose-Capillary  46 (L) 65 - 99 mg/dL  Glucose, capillary     Status: None   Collection Time: 12/01/15  5:49 PM  Result Value Ref Range   Glucose-Capillary 93 65 - 99 mg/dL  Glucose, capillary     Status: None   Collection Time: 12/01/15  9:21 PM  Result Value Ref Range   Glucose-Capillary 70 65 - 99 mg/dL   No results found.  Assessment/Plan: Diagnosis: Left transtibial amputation Labs and images independently reviewed.  Records reviewed and summated above. Wheelchair training Clean amputation daily with soap and water after removal of wound Monitor incision site for signs of infection or impending skin breakdown. Staples to remain in place for 3-4 weeks Stump shrinker, for edema control after removal of wound VAC Scar mobilization massaging to prevent soft tissue adherence Stump protector during therapies Prevent flexion contractures by implementing the following:   Encourage prone lying for 20-30 mins per day BID to avoid hip flexion  Contractures if medically appropriate;  Avoid pillow under knees when patient is lying in bed in order to prevent both  knee and hip flexion contractures;  Avoid prolonged sitting Post surgical pain control with oral medication Phantom limb pain control with physical modalities including desensitization techniques (gentle self massage to the residual stump,hot packs if sensation iintact, [place]) and mirror therapy, TENS. If ineffective, consider pharmacological treatment for neuropathic pain (e.g gabapentin, pregabalin, amytriptalyine, duloxetine).  When using wheelchair, patient should have knee on amputated side fully extended with board under the seat cushion.  1. Does the need for close, 24 hr/day medical supervision in concert with the patient's rehab needs make it unreasonable for this patient to be served in a less intensive setting? Yes  2. Co-Morbidities requiring supervision/potential complications: HTN (monitor and provide prns in accordance with increased  physical exertion and pain), diabetes mellitus and peripheral neuropathy, currently labile (Monitor in accordance with exercise and adjust meds as necessary), end-stage renal disease with hemodialysis (continue recs per nephro), previous right BKA (continue prosthesis), post-op pain (Biofeedback training with therapies to help reduce reliance on opiate pain medications, monitor pain control during therapies, and sedation at rest and titrate to maximum efficacy to ensure participation and gains in therapies), acute on chronic anemia (transfuse if necessary to ensure appropriate perfusion for increased activity tolerance) 3. Due to bladder management, safety, skin/wound care, disease management, pain management and patient education, does the patient require 24 hr/day rehab nursing? Yes 4. Does the patient require coordinated care of a physician, rehab nurse, PT (1-2 hrs/day, 5 days/week) and OT (1-2 hrs/day, 5 days/week) to address physical and functional deficits in the context of the above medical diagnosis(es)? Yes Addressing deficits in the following areas: balance, endurance, locomotion, strength, transferring, bathing, dressing, toileting  and psychosocial support 5. Can the patient actively participate in an intensive therapy program of at least 3 hrs of therapy per day at least 5 days per week? Potentially 6. The potential for patient to make measurable gains while on inpatient rehab is good 7. Anticipated functional outcomes upon discharge from inpatient rehab are TBD  with PT, TBD with OT, n/a with SLP. 8. Estimated rehab length of stay to reach the above functional goals is: TBD 9. Does the patient have adequate social supports and living environment to accommodate these discharge functional goals? Potentially 10. Anticipated D/C setting: Home 11. Anticipated post D/C treatments: HH therapy and Home excercise program 12. Overall Rehab/Functional Prognosis: good  RECOMMENDATIONS: This patient's  condition is appropriate for continued rehabilitative care in the following setting: Will need a more thorough PT evaluation prior to determining appropriate discharge destination. Will also need patient to be medically stable. Given patient's previous amputation and current weakness, if it does not appear that she will be able to achieve an independent level of functioning, she may need to go to SNF., no fevers Patient has agreed to participate in recommended program. Potentially Note that insurance prior authorization may be required for reimbursement for recommended care.  Comment: Rehab Admissions Coordinator to follow up.  Delice Lesch, MD 12/02/2015

## 2015-12-02 NOTE — Progress Notes (Signed)
Subjective:   On hd no co / noted plans for DC to Rehab   Objective Vital signs in last 24 hours: Filed Vitals:   12/02/15 1140 12/02/15 1230 12/02/15 1300 12/02/15 1330  BP: 143/57 119/41 114/53 120/55  Pulse: 88 86 88 90  Temp:      TempSrc:      Resp:      Height:      Weight:      SpO2:       Weight change:    Physical Exam: General: Thin,chronically ill AAF NAD/ OX3 on HD Lungs: CTA unlabored. Heart: RRR No murmurs, rubs, or gallops appreciated. Abdomen: Soft, non-tender, non-distended . Lower extremities: L Transtibial Amp. Site with wound vac / R BKA . [name] Dialysis Access: LUA AVF patent on hd   Dialysis Orders: Center: [facility] on MWF . 4 hrs 0 min, 180NRe Optiflux, BFR 400, DFR Autoflow 1.5, EDW 52kg (will need new lower edw post BKA), Dialysate 3.0 K, 2.25 Ca, UFR Profile: Profile 4, Sodium Model: None, Access: LUA AV Fistula Heparin: 2500 Units per treatment Mircera: 50 mcg IV q 2 weeks (11/13/15) Hectoral: 4 mcg IV Q TTS (last dose 11/25/15)  Problem/Plan: 1. Necrotic L Great Toe/severe PVD: now L BKA per Dr Sharol Given 11/29/15 / has wound vac / noted plans for rehab at [facility] 2. ESRD - [facility] MWF/ on schedule  Today  tight heparin. Will have new lower edw 3. Hypertension/volume -  bp  stable.. Now on clonidine 0.1 mg bid and amlodipine 10mg  hs in hosp./ can dc lasix as she has hd / vol ok  Lower edw at dc needs hoyer lift wt (bed wts not accurate) 4. Anemiaof ESRD and ABL sp surgery/ infec - HGB 10.2.> 8.2>7.2  post op ESA incr 50 to 100 On hd follow CBC.  5. Metabolic bone disease - phos 8.1>6.7 improving in hosp= on Renvela As binder Cont and VDRA. 6. Nutrition - Last in-center Albumin 3.5.>2.4 . Renal carb mod diet, renal vit and add nepro  7. DM: per primary. 8. Disposition: in hosp rehab  noted    Ernest Haber, PA-C [place] [phone] 12/02/2015,2:01 PM  LOS: 3 days   Pt seen, examined and agree w A/P as above.  Kelly Splinter MD [facility] pager [phone]    cell [phone] 12/02/2015, 3:04 PM    Labs: Basic Metabolic Panel:  Recent Labs Lab 11/29/15 2250 11/30/15 1156 12/02/15 0518  NA 135 136 128*  K 6.4* 4.2 4.9  CL 97* 94* 88*  CO2 20* 25 26  GLUCOSE 232* 118* 39*  BUN 65* 15 39*  CREATININE 11.69* 4.19* 7.88*  CALCIUM 8.4* 8.8* 8.0*  PHOS 8.1*  --  6.7*   Liver Function Tests:  Recent Labs Lab 11/29/15 2250 12/02/15 0518  ALBUMIN 2.7* 2.4*   No results for input(s): LIPASE, AMYLASE in the last 168 hours. No results for input(s): AMMONIA in the last 168 hours. CBC:  Recent Labs Lab 11/25/15 2021 11/29/15 1303 11/29/15 2250 12/02/15 0518  WBC 10.0  --  10.2 9.9  NEUTROABS 7.2  --   --   --   HGB 10.3* 10.2* 8.4* 7.2*  HCT 32.1* 30.0* 25.0* 22.5*  MCV 94.1  --  92.9 92.6  PLT 366  --  354 381   Cardiac Enzymes: No results for input(s): CKTOTAL, CKMB, CKMBINDEX, TROPONINI in the last 168 hours. CBG:  Recent Labs Lab 12/01/15 1749 12/01/15 2121 12/02/15 [id number] 12/02/15  [id number] 12/02/15 1330  GLUCAP 93 70 54* 113* 83    Studies/Results: No results found. Medications: . sodium chloride 10 mL/hr at 11/29/15 2230  . sodium chloride 10 mL/hr at 11/29/15 2230   . amLODipine  10 mg Oral Daily  . cloNIDine  0.1 mg Oral BID  . docusate sodium  100 mg Oral BID  . furosemide  10 mg Oral Daily  . gabapentin  300 mg Oral BID  . glipiZIDE  5 mg Oral Q breakfast  . insulin aspart  0-9 Units Subcutaneous TID WC  . sevelamer carbonate  1,600 mg Oral TID WC  . simvastatin  20 mg Oral q1800

## 2015-12-02 NOTE — Discharge Summary (Signed)
Physician Discharge Summary  Patient ID: Carolyn Valenzuela MRN: [id number] DOB/AGE: [birth year] 65 y.o.  Admit date: 11/29/2015 Discharge date: 12/02/2015  Admission Diagnoses:Dry gangrene left foot with diabetic insensate neuropathy, end-stage renal disease, and peripheral vascular disease  Discharge Diagnoses:  Active Problems:   Below knee amputation status [facility])   Discharged Condition: stable  Hospital Course: Patient's hospital course was essentially unremarkable. She underwent transtibial amputation. Postoperatively patient progressed slowly she does have an amputation on the right side. Patient plan for discharge to inpatient versus outpatient rehabilitation.  Consults: None  Significant Diagnostic Studies: labs: Routine labs  Treatments: surgery: See operative note  Discharge Exam: Blood pressure 124/47, pulse 95, temperature 100 F (37.8 C), temperature source Oral, resp. rate 16, height 4\' 11"  (1.499 m), weight 50.5 kg (111 lb 5.3 oz), SpO2 93 %. Incision/Wound: Wound VAC functioning well no drainage  Disposition: 01-Home or Self Care     Medication List    ASK your doctor about these medications        acetaminophen 325 MG tablet  Commonly known as:  TYLENOL  Take 2 tablets (650 mg total) by mouth every 6 (six) hours as needed for mild pain, moderate pain or fever.     amLODipine 10 MG tablet  Commonly known as:  NORVASC  TAKE 1 TABLET BY MOUTH EVERY DAY     aspirin 81 MG EC tablet  Take 81 mg by mouth daily.     cloNIDine 0.1 MG tablet  Commonly known as:  CATAPRES  TAKE 1 TABLET BY MOUTH TWICE A DAY     furosemide 40 MG tablet  Commonly known as:  LASIX  TAKE 2 TABLETS BY MOUTH EVERY MORNING AND TAKE 1 TABLET EVERY EVENING     gabapentin 100 MG capsule  Commonly known as:  NEURONTIN  Take 1 capsule by mouth 2 (two) times daily.     glipiZIDE 5 MG 24 hr tablet  Commonly known as:  GLUCOTROL XL  Take 1 tablet (5 mg total) by mouth daily with  breakfast.     HYDROcodone-acetaminophen 5-325 MG tablet  Commonly known as:  NORCO/VICODIN  TAKE 1 TABLET 2 TO 3 TIMES A DAY AS NEEDED FOR PAIN     HYDROmorphone 2 MG tablet  Commonly known as:  DILAUDID  Take 1 tablet (2 mg total) by mouth every 6 (six) hours as needed for severe pain.     multivitamin with minerals Tabs tablet  Take 1 tablet by mouth daily.     nitroGLYCERIN 0.2 mg/hr patch  Commonly known as:  NITRODUR - Dosed in mg/24 hr  APPLY 1 PATCH DAILY. REMOVE OLD PATCH DAILY. CHANGE PATCH LOCATION DAILY.     oxyCODONE-acetaminophen 5-325 MG tablet  Commonly known as:  PERCOCET/ROXICET  Take 1 tablet by mouth every 6 (six) hours as needed for moderate pain or severe pain.     RENVELA 800 MG tablet  Generic drug:  sevelamer carbonate  Take 1,600 mg by mouth 3 (three) times daily with meals.     simvastatin 20 MG tablet  Commonly known as:  ZOCOR  Take 1 tablet (20 mg total) by mouth daily at 6 PM.           Follow-up Information    Follow up with DUDA,MARCUS V, MD In 2 weeks.   Specialty:  Orthopedic Surgery   Contact information:   [place] [address] [phone]       Signed: Newt Minion 12/02/2015, 6:50  AM

## 2015-12-02 NOTE — Progress Notes (Signed)
Even after pt's CBG up to 97, pt is a&ox4, but still says inappropriate things and drifts to sleep. CBG checked again and was 91. Last narcotic was this morning before dialysis. Pt had been hypoglycemic this morning before breakfast, however was not symptomatic, and was not saying inappropriate things. She was also hypoglycemic during HD. VSS, pt denies any needs or concerns at this time. RN uncomfortable with this change in pt's mental status, so Dr. Marlou Sa was made aware of situation. Received order for head CT, and night RN will page with results.   [place], [name]

## 2015-12-02 NOTE — Progress Notes (Signed)
Patient ID: Carolyn Valenzuela, female   DOB: Jan 24, 1951, 65 y.o.   MRN: [id number] Patient's wound VAC is functioning well. She complains of increased pain. We will increase her Neurontin to 300 mg twice a day. Patient being evaluated for discharge to inpatient rehabilitation versus outpatient rehabilitation. Discharge paperwork completed for discharge.

## 2015-12-02 NOTE — Progress Notes (Signed)
Inpatient Diabetes Program Recommendations  AACE/ADA: New Consensus Statement on Inpatient Glycemic Control (2015)  Target Ranges:  Prepandial:   less than 140 mg/dL      Peak postprandial:   less than 180 mg/dL (1-2 hours)      Critically ill patients:  140 - 180 mg/dL   Review of Glycemic Control Results for SANOE, FRON (MRN [id number]) as of 12/02/2015 14:17  Ref. Range 12/01/2015 05:58 12/01/2015 12:05 12/01/2015 16:29 12/01/2015 17:01 12/01/2015 17:49 12/01/2015 21:21 12/02/2015 06:38 12/02/2015 07:43 12/02/2015 13:30  Glucose-Capillary Latest Ref Range: 65-99 mg/dL 91 84 48 (L) 46 (L) 93 70 54 (L) 113 (H) 83   Diabetes history: DM Type 2 Outpatient Diabetes medications: Glucotrol 5 mg daily  Current orders for Inpatient glycemic control: Glucotrol 5 mg daily + Novolog correction scale SENS 0-9.  Inpatient Diabetes Program Recommendations:  Noted patient likely for discharge and currently in hemodialysis. If patient not discharged today, please consider discontinuing Glucotrol while in the hospital. Spoke with patient, and she states her fasting CBGs approx. 150 @ home. Pt. States she Has a meter and strips and checks daily at home. Reviewed with patient her blood sugars are lower here and she states she eats more @ home. Pt. To report blood sugars to her physicians @ appts.  Thank you, Nani Gasser. Willliam Pettet, RN, MSN, CDE Inpatient Glycemic Control Team Team Pager [phone] (8am-5pm) 12/02/2015 2:21 PM

## 2015-12-02 NOTE — Progress Notes (Signed)
Inpatient Rehabilitation  I met with the patient at the bedside and attempted to discuss recommendation for IP Rehab. Pt. very sleepy and I was not able to get her to awaken sufficiently for a discussion.  She gave me permission to call her sister [name].  [name] did not answer and I left a voicemail requesting a return call as soon as possible.  Pt. currently requiring +2 max assist for stand pivot transfer.  Per PMR consult, it does not currently appear pt. Can  reach an independent level of function and SNF may be needed.  I will visit again in the am to follow up on possible CIR, which will require insurance authorization from pt.'s [place].  I have updated Gildardo Griffes, [facility] and have spoken with Lubertha Sayres, CSW.  Please call if questions.  [facility] Admissions Coordinator Cell [phone] Office [phone]

## 2015-12-02 NOTE — Progress Notes (Signed)
Hypoglycemic Event  CBG: 49  Treatment: 15 GM carbohydrate snack  Symptoms: Pt was sleepy and slightly confused  Follow-up CBG: Time: 1855 CBG Result:97  Possible Reasons for Event: Inadequate meal intake, was in dialysis today, did not eat lunch  Comments/MD notified: After CBG rechecked, pt is A&Ox4. Pt encouraged to eat the rest of her dinner    [place], [name]

## 2015-12-03 ENCOUNTER — Inpatient Hospital Stay (HOSPITAL_COMMUNITY): Payer: BC Managed Care – PPO

## 2015-12-03 LAB — GLUCOSE, CAPILLARY
Glucose-Capillary: 101 mg/dL — ABNORMAL HIGH (ref 65–99)
Glucose-Capillary: 103 mg/dL — ABNORMAL HIGH (ref 65–99)
Glucose-Capillary: 107 mg/dL — ABNORMAL HIGH (ref 65–99)
Glucose-Capillary: 111 mg/dL — ABNORMAL HIGH (ref 65–99)
Glucose-Capillary: 118 mg/dL — ABNORMAL HIGH (ref 65–99)
Glucose-Capillary: 126 mg/dL — ABNORMAL HIGH (ref 65–99)
Glucose-Capillary: 182 mg/dL — ABNORMAL HIGH (ref 65–99)
Glucose-Capillary: 206 mg/dL — ABNORMAL HIGH (ref 65–99)
Glucose-Capillary: 32 mg/dL — CL (ref 65–99)
Glucose-Capillary: 56 mg/dL — ABNORMAL LOW (ref 65–99)
Glucose-Capillary: 67 mg/dL (ref 65–99)
Glucose-Capillary: 94 mg/dL (ref 65–99)

## 2015-12-03 MED ORDER — DEXTROSE 50 % IV SOLN
INTRAVENOUS | Status: AC
  Administered 2015-12-03: 07:00:00
  Filled 2015-12-03: qty 50

## 2015-12-03 MED ORDER — GABAPENTIN 100 MG PO CAPS
100.0000 mg | ORAL_CAPSULE | Freq: Two times a day (BID) | ORAL | Status: DC
  Administered 2015-12-03 – 2015-12-04 (×3): 100 mg via ORAL
  Filled 2015-12-03 (×3): qty 1

## 2015-12-03 NOTE — Progress Notes (Signed)
Pt CBG 56. Pt in no physical distress. Able to answer orientation questions appropriately at this time. RN offered orange juice, will recheck as needed.

## 2015-12-03 NOTE — Progress Notes (Signed)
Spoke with Dr. Sharol Given on the phone regarding pt's blood sugars and anti-diabetic medication regimen. Since it has been so difficult keeping pt's BS WNL and pt is extremely symptomatic when BS drops, was instructed to hold pt's anti-diabetic medication unless CBG is over 200. Pt currently awake and alert, resting comfortably in the chair. Will continue to monitor

## 2015-12-03 NOTE — NC FL2 (Signed)
[place] LEVEL OF CARE SCREENING TOOL     IDENTIFICATION  Patient Name: Carolyn Valenzuela Birthdate: 03/06/1951 Sex: female Admission Date (Current Location): 11/29/2015  [facility] and [place] Number:  Herbalist and Address:  [facility], [place] [address], [place], [place] [address]      Provider Number: [id number]  Attending Physician Name and Address:  Newt Minion, MD  Relative Name and Phone Number:       Current Level of Care: Hospital Recommended Level of Care: [place], Other (Comment) ([facility]) Prior Approval Number:    Date Approved/Denied:   PASRR Number: [id number]  Discharge Plan: SNF    Current Diagnoses: Patient Active Problem List   Diagnosis Date Noted  . Type 2 diabetes mellitus with peripheral neuropathy (HCC)   . Labile blood glucose   . End stage renal disease ([place])   . Postoperative pain   . Acute blood loss anemia   . Anemia of chronic disease   . Below knee amputation status ([place]) 11/29/2015  . Odynophagia 11/12/2015  . Chest pain, atypical 06/06/2015  . Chest pain 05/28/2015  . Ingrown nail 05/28/2015  . Acute pulmonary edema (HCC)   . FUO (fever of unknown origin)   . HCAP (healthcare-associated pneumonia)   . ESRD needing dialysis ([place])   . Type 2 diabetes mellitus with ESRD (end-stage renal disease) ([place]) 09/01/2014  . Anemia of renal disease 09/01/2014  . Venous insufficiency of left leg 08/27/2014  . Rash 07/06/2014  . S/P BKA (below knee amputation) ([place]) 03/20/2014  . 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 Critical lower limb ischemia 01/09/2014  . Right foot ulcer ([place]) 11/07/2013  . Toe pain 09/06/2013  . Pre-ulcerative corn or callous 09/06/2013  . Lower extremity pain 09/06/2013  . Left low back pain 12/08/2011  . Hip bursitis, left 12/08/2011  . Left leg pain 12/08/2011  . Abdominal pain, other specified site 07/08/2011  . Preventative  health care 12/08/2010  . Allergic rhinitis, cause unspecified 12/08/2010  . SECONDARY HYPERPARATHYROIDISM 05/21/2009  . NUMBNESS 05/21/2009  . BACK PAIN 05/02/2009  . ANEMIA-IRON DEFICIENCY 01/25/2008  . Essential hypertension 01/25/2008  . CERVICAL RADICULOPATHY, LEFT 01/25/2008  . Hyperlipidemia 03/30/2007    Orientation RESPIRATION BLADDER Height & Weight     Self, Time, Situation, Place  Normal Continent Weight: 119 lb 4.3 oz (54.1 kg) Height:  4\' 11"  (149.9 cm)  BEHAVIORAL SYMPTOMS/MOOD NEUROLOGICAL BOWEL NUTRITION STATUS      Continent Diet (Currently carb modified, please see discharge summary for any changes.)  AMBULATORY STATUS COMMUNICATION OF NEEDS Skin    (Per PT note, patient only standing with PT at this time.) Verbally Surgical wounds                       Personal Care Assistance Level of Assistance  Bathing, Feeding, Dressing Bathing Assistance: Limited assistance Feeding assistance: Independent Dressing Assistance: Limited assistance     Functional Limitations Info             SPECIAL CARE FACTORS FREQUENCY  PT (By licensed PT), OT (By licensed OT)                    Contractures      Additional Factors Info  Code Status, Allergies Code Status Info: FULL Allergies Info: Oxycodone           Current Medications (  12/03/2015):  This is the current hospital active medication list Current Facility-Administered Medications  Medication Dose Route Frequency Provider Last Rate Last Dose  . 0.9 %  sodium chloride infusion   Intravenous Continuous Newt Minion, MD 10 mL/hr at 11/29/15 2230    . 0.9 %  sodium chloride infusion   Intravenous Continuous Newt Minion, MD 10 mL/hr at 11/29/15 2230    . acetaminophen (TYLENOL) tablet 650 mg  650 mg Oral Q6H PRN Newt Minion, MD   650 mg at 12/03/15 [id number]   Or  . acetaminophen (TYLENOL) suppository 650 mg  650 mg Rectal Q6H PRN Newt Minion, MD      . amLODipine (NORVASC) tablet 10 mg  10 mg Oral  Daily Newt Minion, MD   10 mg at 12/03/15 0955  . bisacodyl (DULCOLAX) suppository 10 mg  10 mg Rectal Daily PRN Meridee Score V, MD      . cloNIDine (CATAPRES) tablet 0.1 mg  0.1 mg Oral BID Newt Minion, MD   0.1 mg at 12/03/15 0955  . [START ON 12/04/2015] Darbepoetin Alfa (ARANESP) injection 100 mcg  100 mcg Intravenous Q Wed-HD Ernest Haber, PA-C      . docusate sodium (COLACE) capsule 100 mg  100 mg Oral BID Newt Minion, MD   100 mg at 12/03/15 0952  . feeding supplement (NEPRO CARB STEADY) liquid 237 mL  237 mL Oral TID WC Ernest Haber, PA-C   237 mL at 12/03/15 0952  . gabapentin (NEURONTIN) capsule 100 mg  100 mg Oral BID Newt Minion, MD   100 mg at 12/03/15 0954  . glipiZIDE (GLUCOTROL XL) 24 hr tablet 5 mg  5 mg Oral Q breakfast Newt Minion, MD   Stopped at 12/02/15 [id number]  . HYDROcodone-acetaminophen (NORCO) 10-325 MG per tablet 1-2 tablet  1-2 tablet Oral Q4H PRN Newt Minion, MD   1 tablet at 12/03/15 0351  . HYDROmorphone (DILAUDID) injection 1 mg  1 mg Intravenous Q2H PRN Newt Minion, MD   1 mg at 11/30/15 0415  . insulin aspart (novoLOG) injection 0-9 Units  0-9 Units Subcutaneous TID WC Newt Minion, MD   1 Units at 11/30/15 1752  . metoCLOPramide (REGLAN) injection 5-10 mg  5-10 mg Intravenous Q8H PRN Newt Minion, MD      . metoCLOPramide (REGLAN) tablet 5-10 mg  5-10 mg Oral Q8H PRN Newt Minion, MD      . ondansetron [facility]) tablet 4 mg  4 mg Oral Q6H PRN Newt Minion, MD       Or  . ondansetron [facility]) injection 4 mg  4 mg Intravenous Q6H PRN Newt Minion, MD      . polyethylene glycol (MIRALAX / GLYCOLAX) packet 17 g  17 g Oral Daily PRN Meridee Score V, MD      . sevelamer carbonate (RENVELA) tablet 1,600 mg  1,600 mg Oral TID WC Newt Minion, MD   1,600 mg at 12/03/15 0953  . simvastatin (ZOCOR) tablet 20 mg  20 mg Oral q1800 Newt Minion, MD   20 mg at 12/02/15 1655     Discharge Medications: Please see discharge summary for a list of discharge  medications.  Relevant Imaging Results:  Relevant Lab Results:   Additional Information SSN: [id number]  Caroline Sauger, LCSW

## 2015-12-03 NOTE — Progress Notes (Signed)
Physical Therapy Treatment Patient Details Name: Carolyn Valenzuela MRN: [id number] DOB: [birth year] Today's Date: 12/03/2015    History of Present Illness Patient with significant PMH of DM on HD and severe PVD, s/p Right BKA in 2015, underwent Left BKA on 11/29/15 for gangrenous left foot    PT Comments    Patient is making progress toward mobility goals with improved sitting and standing balance this session. Able to donn R LE prosthesis independently with no LOB. Pt c/o dizziness and was sleepy throughout session but very motivated to participate in therapy. BP= 122/63 and SpO2 100%. Continue to progress as tolerated.   Follow Up Recommendations  CIR     Equipment Recommendations  None recommended by PT    Recommendations for Other Services Rehab consult     Precautions / Restrictions Precautions Precautions: Fall Precaution Comments: wound vac Required Braces or Orthoses: Other Brace/Splint Other Brace/Splint: has prosthetic for RLE Restrictions Weight Bearing Restrictions: Yes LLE Weight Bearing: Non weight bearing    Mobility  Bed Mobility Overal bed mobility: Needs Assistance Bed Mobility: Supine to Sit     Supine to sit: HOB elevated;Mod assist     General bed mobility comments: cues for technique with assist to elevate trunk into sitting and bring hips to EOB with use of bedpad; use of bedrails  Transfers Overall transfer level: Needs assistance Equipment used: Rolling walker (2 wheeled);2 person hand held assist Transfers: Sit to/from [facility] Sit to Stand: Mod assist;+2 physical assistance;+2 safety/equipment;Min assist Stand pivot transfers: Max assist;+2 physical assistance;+2 safety/equipment       General transfer comment: mod A to power up into standing with cues for safe hand placement and technique and min A to maintain balance in standing with verbal and tactile cues for posture and weight shifting; pt with improved balance and  ability to stand on R LE; max A to pivot to chair but demonstrated abiltiy to pivot R foot this session   Ambulation/Gait                 Stairs            Wheelchair Mobility    Modified Rankin (Stroke Patients Only)       Balance Overall balance assessment: Needs assistance Sitting-balance support: Feet unsupported;Single extremity supported Sitting balance-Leahy Scale: Fair Sitting balance - Comments: improved sitting balance    Standing balance support: Bilateral upper extremity supported Standing balance-Leahy Scale: Poor                      Cognition Arousal/Alertness: Awake/alert Behavior During Therapy: WFL for tasks assessed/performed Overall Cognitive Status: Within Functional Limits for tasks assessed                      Exercises      General Comments General comments (skin integrity, edema, etc.): reviewed positioning of L LE      Pertinent Vitals/Pain Pain Assessment: Faces Faces Pain Scale: Hurts a little bit Pain Location: L LE Pain Descriptors / Indicators: Sore Pain Intervention(s): Monitored during session;Premedicated before session;Limited activity within patient's tolerance;Repositioned    Home Living                      Prior Function            PT Goals (current goals can now be found in the care plan section) Acute Rehab PT Goals Patient Stated Goal: to be able to  take care of myself again Progress towards PT goals: Progressing toward goals    Frequency  Min 4X/week    PT Plan Current plan remains appropriate    Co-evaluation             End of Session Equipment Utilized During Treatment: Gait belt;Other (comment) (R LE prosthesis) Activity Tolerance: Patient tolerated treatment well Patient left: in chair;with call bell/phone within reach;with chair alarm set     Time: [id number] PT Time Calculation (min) (ACUTE ONLY): 25 min  Charges:  $Therapeutic Activity: 23-37 mins                     G Codes:      Salina April, PTA Pager: [phone]   12/03/2015, 8:59 AM

## 2015-12-03 NOTE — Clinical Social Work Placement (Signed)
   CLINICAL SOCIAL WORK PLACEMENT  NOTE  Date:  12/03/2015  Patient Details  Name: FLORENE MANRIQUEZ MRN: [id number] Date of Birth: 11/15/1950  Clinical Social Work is seeking post-discharge placement for this patient at the [place] level of care (*CSW will initial, date and re-position this form in  chart as items are completed):  Yes   Patient/family provided with [place] Work Department's list of facilities offering this level of care within the geographic area requested by the patient (or if unable, by the patient's family).  Yes   Patient/family informed of their freedom to choose among providers that offer the needed level of care, that participate in Medicare, Medicaid or managed care program needed by the patient, have an available bed and are willing to accept the patient.  Yes   Patient/family informed of [place]'s ownership interest in [facility] and [facility], as well as of the fact that they are under no obligation to receive care at these facilities.  PASRR submitted to EDS on       PASRR number received on       Existing PASRR number confirmed on 12/03/15     FL2 transmitted to all facilities in geographic area requested by pt/family on 12/03/15     FL2 transmitted to all facilities within larger geographic area on       Patient informed that his/her managed care company has contracts with or will negotiate with certain facilities, including the following:            Patient/family informed of bed offers received.  Patient chooses bed at       Physician recommends and patient chooses bed at      Patient to be transferred to   on  .  Patient to be transferred to facility by       Patient family notified on   of transfer.  Name of family member notified:        PHYSICIAN Please prepare prescriptions, Please sign FL2     Additional Comment:    _______________________________________________ Caroline Sauger, LCSW 12/03/2015, 2:00 PM

## 2015-12-03 NOTE — Progress Notes (Signed)
CRITICAL VALUE ALERT  Critical value received:  CBG 32  Date of notification:  12/03/15  Time of notification:  [id number]  Critical value read back:Yes.    Nurse who received alert:  Misty Rago e  MD notified (1st page):  duda  Time of first page:  [phone]  MD notified (2nd page):  Time of second page:  Responding MD:  duda  Time MD responded:  [phone]

## 2015-12-03 NOTE — Progress Notes (Signed)
Inpatient Diabetes Program Recommendations  AACE/ADA: New Consensus Statement on Inpatient Glycemic Control (2015)  Target Ranges:  Prepandial:   less than 140 mg/dL      Peak postprandial:   less than 180 mg/dL (1-2 hours)      Critically ill patients:  140 - 180 mg/dL   Review of Glycemic Control Results for BETHANIE, RUMPLE (MRN [id number]) as of 12/03/2015 12:06  Ref. Range 12/03/2015 06:41 12/03/2015 07:00 12/03/2015 08:14 12/03/2015 10:05  Glucose-Capillary Latest Ref Range: 65-99 mg/dL 32 (LL) 206 (H) 182 (H) 118 (H)   Diabetes history: DM Type 2 Outpatient Diabetes medications: Glucotrol 5 mg daily Current orders for Inpatient glycemic control: Glucotrol 5 mg daily  Inpatient Diabetes Program Recommendations:  Please consider discontinuing Glucotrol while in the hospital. CBG this am was 32.   Thank you, Nani Gasser. Chalisa Kobler, RN, MSN, CDE Inpatient Glycemic Control Team Team Pager [phone] (8am-5pm) 12/03/2015 12:51 PM

## 2015-12-03 NOTE — Progress Notes (Signed)
On assessment of CBG , value 32 and pt sleepy and responsive to verbal and tactile stimulation. Carolyn Valenzuela Given notified and ordered 1 amp D50. RN admin and pt returned to baselin mentation and answering questions appropriately. Upon reassessment pt CBG 206. Nursing will continue to monitor

## 2015-12-03 NOTE — Progress Notes (Signed)
Patient ID: Carolyn Valenzuela, female   DOB: [birth year], 65 y.o.   MRN: [id number] Patient with very labile blood glucose last night. Patient with lethargy with low CBG. Patient had a CT scan of her head which was unremarkable. Patient states the unstable blood glucose is normal at home and her lethargy with low blood glucose is normal at home. Currently checking glucose every 2 hours. Patient has no long-acting insulin. Wound VAC without drainage no evidence of bleeding. Patient still under consideration for discharge to inpatient rehabilitation. Resume previous dose of Neurontin.

## 2015-12-03 NOTE — Progress Notes (Signed)
Inpatient Rehabilitation  I met with the patient again this am.  She is more alert and capable of discussing recommendation for IP Rehab.  Pt. States she was on CIR following her other amputation in 2015 and would like to receive her rehab care this time at [facility].  I spoke with pt.'s sister [name] this am.  [name] states none of the siblings are able to assist pt. following her rehab due to their own responsibilities.  I informed pt. That she will need someone to assist her post rehab (in all likelihood).  I will initiate insurance authorization process.  I have updated Gildardo Griffes, RNCM.  [facility] Kidney is attempting to reach Lubertha Sayres, CSW to discuss.  Please call if questions.  [place] Admissions Coordinator Cell [phone] Office [phone]

## 2015-12-03 NOTE — Progress Notes (Signed)
Subjective:   Up in chair  Objective Vital signs in last 24 hours: Filed Vitals:   12/02/15 2028 12/03/15 0100 12/03/15 0500 12/03/15 0954  BP: 124/53 130/62 101/45 135/57  Pulse: 91 91 92   Temp: 102.9 F (39.4 C) 100.1 F (37.8 C) 98.7 F (37.1 C)   TempSrc: Oral     Resp: 16     Height:      Weight:      SpO2: 98% 98% 100%    Weight change:    Physical Exam: General: Thin,chronically ill AAF NAD/ OX3 on HD Lungs: CTA unlabored. Heart: RRR No murmurs, rubs, or gallops appreciated. Abdomen: Soft, non-tender, non-distended . Lower extremities: L Transtibial Amp. Site with wound vac / R BKA . [name] Dialysis Access: LUA AVF patent on hd   3/31 CXR no active disease  Dialysis: [facility] MWF  4h  52kg preBKA  3K/2.25 bath  P4  LUA AVF  Hep 2500 Mircera: 50 mcg IV q 2 weeks (11/13/15) Hectoral: 4 mcg IV Q TTS (last dose 11/25/15)  Problems: 1. Gangrene L foot sp L BKA 3/31 - has wound vac, poss plans for rehab at [facility] 2. ESRD - [facility] MWF HD 3. HTN - norvasc/ clon, BP's good 4. Volume - up 1kg but should have lower dry wt after BKA so prob up 3-4kg 5. Anemiaof ESRD and ABL- Hb down 10 > 7's, esa ^'d 50 > 100 ug/ wk 6. Metabolic bone disease - phos 8.1>6.7 cont binder/ VDRA 7. Nutrition - Last in-center Albumin 3.5.>2.4 . Renal carb mod diet, renal vit and add nepro  8. DM: per primary. 9. Disposition: in hosp rehab  noted   Plan -  As above. HD tomorrow. Get CXR for low SaO2.  Extra HD today if wet.    Kelly Splinter MD [facility] pager [phone]    cell [phone] 12/03/2015, 11:18 AM    Labs: Basic Metabolic Panel:  Recent Labs Lab 11/29/15 2250 11/30/15 1156 12/02/15 0518  NA 135 136 128*  K 6.4* 4.2 4.9  CL 97* 94* 88*  CO2 20* 25 26  GLUCOSE 232* 118* 39*  BUN 65* 15 39*  CREATININE 11.69* 4.19* 7.88*  CALCIUM 8.4* 8.8* 8.0*  PHOS 8.1*  --  6.7*   Liver Function Tests:  Recent Labs Lab 11/29/15 2250 12/02/15 0518  ALBUMIN 2.7*  2.4*   No results for input(s): LIPASE, AMYLASE in the last 168 hours. No results for input(s): AMMONIA in the last 168 hours. CBC:  Recent Labs Lab 11/29/15 1303 11/29/15 2250 12/02/15 0518  WBC  --  10.2 9.9  HGB 10.2* 8.4* 7.2*  HCT 30.0* 25.0* 22.5*  MCV  --  92.9 92.6  PLT  --  354 381   Cardiac Enzymes: No results for input(s): CKTOTAL, CKMB, CKMBINDEX, TROPONINI in the last 168 hours. CBG:  Recent Labs Lab 12/03/15 0223 12/03/15 0416 12/03/15 0641 12/03/15 0700 12/03/15 0814  GLUCAP 56* 67 32* 206* 182*    Studies/Results: Ct Head Wo Contrast  12/02/2015  CLINICAL DATA:  Initial evaluation for acute altered mental status. EXAM: CT HEAD WITHOUT CONTRAST TECHNIQUE: Contiguous axial images were obtained from the base of the skull through the vertex without intravenous contrast. COMPARISON:  None available. FINDINGS: Generalized cerebral atrophy with mild chronic small vessel ischemic disease present. Prominent vascular calcifications within the carotid siphons. No acute large vessel territory infarct. No intracranial hemorrhage. No mass lesion, midline shift, or mass effect. No hydrocephalus. No extra-axial  fluid collection. Scalp soft tissues demonstrate no acute abnormality. No acute abnormality about the orbits. Visualized paranasal sinuses and mastoid air cells are clear. Calvarium intact. IMPRESSION: 1. No acute intracranial process. 2. Mild age-related cerebral atrophy with chronic small vessel ischemic disease. Prominent intracranial atherosclerosis. Electronically Signed   By: Jeannine Boga M.D.   On: 12/02/2015 21:33   Medications: . sodium chloride 10 mL/hr at 11/29/15 2230  . sodium chloride 10 mL/hr at 11/29/15 2230   . amLODipine  10 mg Oral Daily  . cloNIDine  0.1 mg Oral BID  . [START ON 12/04/2015] darbepoetin (ARANESP) injection - DIALYSIS  100 mcg Intravenous Q Wed-HD  . docusate sodium  100 mg Oral BID  . feeding supplement (NEPRO CARB STEADY)   237 mL Oral TID WC  . furosemide  10 mg Oral Daily  . gabapentin  100 mg Oral BID  . glipiZIDE  5 mg Oral Q breakfast  . insulin aspart  0-9 Units Subcutaneous TID WC  . sevelamer carbonate  1,600 mg Oral TID WC  . simvastatin  20 mg Oral q1800

## 2015-12-03 NOTE — Progress Notes (Signed)
At shift change, pt AXO but thinking inappropriate according to day nurse giving report-off. CBG 91 but has been having trouble with maintaining appropriate CBGs. Day RN paged MD Marlou Sa. Head CT ordered. Head CT unremarkable. However, pt disoriented to time and situation on return from CT. RN notified MD of CT results and change in mentation. MD attributing AMS to low CBGs. Verbal order given for D5NS infusion x 8hrs and q2hr CBGs.   Update:   Delay in starting ordered fluids. Pt dislodged IV. IV team consult made. Pt with new IV and fluids started.Pt continues to be hypoglycemic however, mentation is appropriate now. RN treating low CBGs as needed. VSS, Pt otherwise in no distress. RN will continue to monitor.

## 2015-12-03 NOTE — Clinical Social Work Note (Signed)
CSW consulted regarding possible SNF placement. Full assessment to follow.  Lubertha Sayres, [facility]: [phone] Surgical: [phone]

## 2015-12-04 ENCOUNTER — Inpatient Hospital Stay (HOSPITAL_COMMUNITY)
Admission: RE | Admit: 2015-12-04 | Discharge: 2015-12-20 | DRG: 559 | Disposition: A | Discharge status: Home Health | Payer: BC Managed Care – PPO | Source: Intra-hospital | Attending: Physical Medicine & Rehabilitation | Admitting: Physical Medicine & Rehabilitation

## 2015-12-04 DIAGNOSIS — I10 Essential (primary) hypertension: Secondary | ICD-10-CM | POA: Diagnosis not present

## 2015-12-04 DIAGNOSIS — R0902 Hypoxemia: Secondary | ICD-10-CM | POA: Diagnosis present

## 2015-12-04 DIAGNOSIS — E1122 Type 2 diabetes mellitus with diabetic chronic kidney disease: Secondary | ICD-10-CM | POA: Diagnosis present

## 2015-12-04 DIAGNOSIS — G8918 Other acute postprocedural pain: Secondary | ICD-10-CM | POA: Diagnosis present

## 2015-12-04 DIAGNOSIS — Z992 Dependence on renal dialysis: Secondary | ICD-10-CM

## 2015-12-04 DIAGNOSIS — M5412 Radiculopathy, cervical region: Secondary | ICD-10-CM | POA: Diagnosis present

## 2015-12-04 DIAGNOSIS — Z89511 Acquired absence of right leg below knee: Secondary | ICD-10-CM | POA: Diagnosis not present

## 2015-12-04 DIAGNOSIS — B9689 Other specified bacterial agents as the cause of diseases classified elsewhere: Secondary | ICD-10-CM | POA: Diagnosis not present

## 2015-12-04 DIAGNOSIS — E11649 Type 2 diabetes mellitus with hypoglycemia without coma: Secondary | ICD-10-CM | POA: Diagnosis not present

## 2015-12-04 DIAGNOSIS — Z89512 Acquired absence of left leg below knee: Secondary | ICD-10-CM | POA: Diagnosis not present

## 2015-12-04 DIAGNOSIS — R739 Hyperglycemia, unspecified: Secondary | ICD-10-CM | POA: Diagnosis not present

## 2015-12-04 DIAGNOSIS — E1151 Type 2 diabetes mellitus with diabetic peripheral angiopathy without gangrene: Secondary | ICD-10-CM | POA: Diagnosis present

## 2015-12-04 DIAGNOSIS — E1152 Type 2 diabetes mellitus with diabetic peripheral angiopathy with gangrene: Secondary | ICD-10-CM | POA: Diagnosis not present

## 2015-12-04 DIAGNOSIS — Z4781 Encounter for orthopedic aftercare following surgical amputation: Secondary | ICD-10-CM | POA: Diagnosis not present

## 2015-12-04 DIAGNOSIS — R030 Elevated blood-pressure reading, without diagnosis of hypertension: Secondary | ICD-10-CM | POA: Diagnosis not present

## 2015-12-04 DIAGNOSIS — N390 Urinary tract infection, site not specified: Secondary | ICD-10-CM | POA: Diagnosis not present

## 2015-12-04 DIAGNOSIS — E1165 Type 2 diabetes mellitus with hyperglycemia: Secondary | ICD-10-CM | POA: Diagnosis present

## 2015-12-04 DIAGNOSIS — D72829 Elevated white blood cell count, unspecified: Secondary | ICD-10-CM | POA: Diagnosis present

## 2015-12-04 DIAGNOSIS — D638 Anemia in other chronic diseases classified elsewhere: Secondary | ICD-10-CM

## 2015-12-04 DIAGNOSIS — M62838 Other muscle spasm: Secondary | ICD-10-CM | POA: Diagnosis present

## 2015-12-04 DIAGNOSIS — N2581 Secondary hyperparathyroidism of renal origin: Secondary | ICD-10-CM | POA: Diagnosis present

## 2015-12-04 DIAGNOSIS — R1310 Dysphagia, unspecified: Secondary | ICD-10-CM | POA: Diagnosis present

## 2015-12-04 DIAGNOSIS — R339 Retention of urine, unspecified: Secondary | ICD-10-CM | POA: Diagnosis not present

## 2015-12-04 DIAGNOSIS — I120 Hypertensive chronic kidney disease with stage 5 chronic kidney disease or end stage renal disease: Secondary | ICD-10-CM | POA: Diagnosis present

## 2015-12-04 DIAGNOSIS — M9080 Osteopathy in diseases classified elsewhere, unspecified site: Secondary | ICD-10-CM

## 2015-12-04 DIAGNOSIS — D62 Acute posthemorrhagic anemia: Secondary | ICD-10-CM | POA: Diagnosis present

## 2015-12-04 DIAGNOSIS — E889 Metabolic disorder, unspecified: Secondary | ICD-10-CM | POA: Diagnosis present

## 2015-12-04 DIAGNOSIS — A419 Sepsis, unspecified organism: Secondary | ICD-10-CM | POA: Diagnosis not present

## 2015-12-04 DIAGNOSIS — N186 End stage renal disease: Secondary | ICD-10-CM | POA: Diagnosis present

## 2015-12-04 DIAGNOSIS — R269 Unspecified abnormalities of gait and mobility: Secondary | ICD-10-CM | POA: Diagnosis present

## 2015-12-04 DIAGNOSIS — D631 Anemia in chronic kidney disease: Secondary | ICD-10-CM | POA: Diagnosis present

## 2015-12-04 DIAGNOSIS — S88119A Complete traumatic amputation at level between knee and ankle, unspecified lower leg, initial encounter: Secondary | ICD-10-CM | POA: Diagnosis present

## 2015-12-04 DIAGNOSIS — M898X9 Other specified disorders of bone, unspecified site: Secondary | ICD-10-CM | POA: Diagnosis present

## 2015-12-04 DIAGNOSIS — R509 Fever, unspecified: Secondary | ICD-10-CM

## 2015-12-04 LAB — RENAL FUNCTION PANEL
Albumin: 2.1 g/dL — ABNORMAL LOW (ref 3.5–5.0)
Anion gap: 15 (ref 5–15)
BUN: 41 mg/dL — ABNORMAL HIGH (ref 6–20)
CO2: 23 mmol/L (ref 22–32)
Calcium: 8 mg/dL — ABNORMAL LOW (ref 8.9–10.3)
Chloride: 85 mmol/L — ABNORMAL LOW (ref 101–111)
Creatinine, Ser: 7.16 mg/dL — ABNORMAL HIGH (ref 0.44–1.00)
GFR calc Af Amer: 6 mL/min — ABNORMAL LOW (ref >60)
GFR calc non Af Amer: 5 mL/min — ABNORMAL LOW (ref >60)
Glucose, Bld: 111 mg/dL — ABNORMAL HIGH (ref 65–99)
Phosphorus: 6.6 mg/dL — ABNORMAL HIGH (ref 2.5–4.6)
Potassium: 5.2 mmol/L — ABNORMAL HIGH (ref 3.5–5.1)
Sodium: 123 mmol/L — ABNORMAL LOW (ref 135–145)

## 2015-12-04 LAB — CBC
HCT: 17.7 % — ABNORMAL LOW (ref 36.0–46.0)
Hemoglobin: 5.9 g/dL — CL (ref 12.0–15.0)
MCH: 30.6 pg (ref 26.0–34.0)
MCHC: 33.3 g/dL (ref 30.0–36.0)
MCV: 91.7 fL (ref 78.0–100.0)
Platelets: 359 10*3/uL (ref 150–400)
RBC: 1.93 MIL/uL — ABNORMAL LOW (ref 3.87–5.11)
RDW: 15.4 % (ref 11.5–15.5)
WBC: 8.1 10*3/uL (ref 4.0–10.5)

## 2015-12-04 LAB — GLUCOSE, CAPILLARY
Glucose-Capillary: 105 mg/dL — ABNORMAL HIGH (ref 65–99)
Glucose-Capillary: 110 mg/dL — ABNORMAL HIGH (ref 65–99)
Glucose-Capillary: 98 mg/dL (ref 65–99)
Glucose-Capillary: 98 mg/dL (ref 65–99)
Glucose-Capillary: 131 mg/dL — ABNORMAL HIGH (ref 65–99)
Glucose-Capillary: 299 mg/dL — ABNORMAL HIGH (ref 65–99)

## 2015-12-04 LAB — PREPARE RBC (CROSSMATCH)

## 2015-12-04 MED ORDER — HYDROCODONE-ACETAMINOPHEN 10-325 MG PO TABS
1.0000 | ORAL_TABLET | ORAL | Status: DC | PRN
  Administered 2015-12-04 – 2015-12-05 (×3): 2 via ORAL
  Administered 2015-12-06 – 2015-12-09 (×4): 1 via ORAL
  Administered 2015-12-10: 2 via ORAL
  Administered 2015-12-10: 1 via ORAL
  Administered 2015-12-10 – 2015-12-11 (×3): 2 via ORAL
  Administered 2015-12-12: 1 via ORAL
  Administered 2015-12-12 – 2015-12-13 (×2): 2 via ORAL
  Administered 2015-12-14 – 2015-12-15 (×3): 1 via ORAL
  Administered 2015-12-16 – 2015-12-17 (×3): 2 via ORAL
  Administered 2015-12-17: 1 via ORAL
  Administered 2015-12-18 (×3): 2 via ORAL
  Administered 2015-12-19: 1 via ORAL
  Administered 2015-12-19: 2 via ORAL
  Filled 2015-12-04 (×3): qty 2
  Filled 2015-12-04 (×3): qty 1
  Filled 2015-12-04: qty 2
  Filled 2015-12-04: qty 1
  Filled 2015-12-04 (×3): qty 2
  Filled 2015-12-04 (×3): qty 1
  Filled 2015-12-04: qty 2
  Filled 2015-12-04: qty 1
  Filled 2015-12-04: qty 2
  Filled 2015-12-04 (×4): qty 1
  Filled 2015-12-04 (×8): qty 2

## 2015-12-04 MED ORDER — DIPHENHYDRAMINE HCL 12.5 MG/5ML PO ELIX: 12.5000 mg | ORAL_SOLUTION | Freq: Four times a day (QID) | ORAL | Status: DC | PRN

## 2015-12-04 MED ORDER — ENOXAPARIN SODIUM 30 MG/0.3ML ~~LOC~~ SOLN: 30.0000 mg | SUBCUTANEOUS | Status: DC

## 2015-12-04 MED ORDER — FAMOTIDINE 10 MG PO TABS
10.0000 mg | ORAL_TABLET | Freq: Every day | ORAL | Status: DC
  Administered 2015-12-05 – 2015-12-19 (×15): 10 mg via ORAL
  Filled 2015-12-04 (×15): qty 1

## 2015-12-04 MED ORDER — NEPRO/CARBSTEADY PO LIQD
237.0000 mL | Freq: Three times a day (TID) | ORAL | Status: DC
  Administered 2015-12-05 – 2015-12-07 (×6): 237 mL via ORAL

## 2015-12-04 MED ORDER — POLYETHYLENE GLYCOL 3350 17 G PO PACK: 17.0000 g | PACK | Freq: Every day | ORAL | Status: DC | PRN

## 2015-12-04 MED ORDER — METHOCARBAMOL 500 MG PO TABS: 250.0000 mg | ORAL_TABLET | Freq: Four times a day (QID) | ORAL | Status: DC | PRN

## 2015-12-04 MED ORDER — SENNOSIDES-DOCUSATE SODIUM 8.6-50 MG PO TABS
1.0000 | ORAL_TABLET | Freq: Every day | ORAL | Status: DC
Start: 2015-12-04 — End: 2015-12-09
  Administered 2015-12-04 – 2015-12-08 (×5): 1 via ORAL
  Filled 2015-12-04 (×5): qty 1

## 2015-12-04 MED ORDER — SODIUM CHLORIDE 0.9 % IV SOLN: Freq: Once | INTRAVENOUS | Status: DC

## 2015-12-04 MED ORDER — HEPARIN SODIUM (PORCINE) 1000 UNIT/ML DIALYSIS: 1000.0000 [IU] | INTRAMUSCULAR | Status: DC | PRN

## 2015-12-04 MED ORDER — HYDROCODONE-ACETAMINOPHEN 5-325 MG PO TABS
ORAL_TABLET | ORAL | Status: AC
  Filled 2015-12-04: qty 1

## 2015-12-04 MED ORDER — GABAPENTIN 100 MG PO CAPS
100.0000 mg | ORAL_CAPSULE | Freq: Two times a day (BID) | ORAL | Status: DC
  Administered 2015-12-04 – 2015-12-19 (×31): 100 mg via ORAL
  Filled 2015-12-04 (×31): qty 1

## 2015-12-04 MED ORDER — BISACODYL 10 MG RE SUPP
10.0000 mg | Freq: Every day | RECTAL | Status: DC | PRN
  Administered 2015-12-04 – 2015-12-07 (×2): 10 mg via RECTAL
  Filled 2015-12-04 (×2): qty 1

## 2015-12-04 MED ORDER — ACETAMINOPHEN 325 MG PO TABS
325.0000 mg | ORAL_TABLET | ORAL | Status: DC | PRN
  Administered 2015-12-06: 650 mg via ORAL
  Administered 2015-12-09: 325 mg via ORAL
  Administered 2015-12-11 – 2015-12-17 (×9): 650 mg via ORAL
  Filled 2015-12-04 (×13): qty 2

## 2015-12-04 MED ORDER — BISACODYL 10 MG RE SUPP: 10.0000 mg | Freq: Every day | RECTAL | Status: DC | PRN

## 2015-12-04 MED ORDER — SIMVASTATIN 20 MG PO TABS
20.0000 mg | ORAL_TABLET | Freq: Every day | ORAL | Status: DC
  Administered 2015-12-05 – 2015-12-19 (×12): 20 mg via ORAL
  Filled 2015-12-04 (×15): qty 1

## 2015-12-04 MED ORDER — DARBEPOETIN ALFA 100 MCG/0.5ML IJ SOSY
PREFILLED_SYRINGE | INTRAMUSCULAR | Status: AC
  Filled 2015-12-04: qty 0.5

## 2015-12-04 MED ORDER — LIDOCAINE HCL (PF) 1 % IJ SOLN: 5.0000 mL | INTRAMUSCULAR | Status: DC | PRN

## 2015-12-04 MED ORDER — SEVELAMER CARBONATE 800 MG PO TABS
1600.0000 mg | ORAL_TABLET | Freq: Three times a day (TID) | ORAL | Status: DC
  Administered 2015-12-05 – 2015-12-11 (×17): 1600 mg via ORAL
  Filled 2015-12-04 (×18): qty 2

## 2015-12-04 MED ORDER — SIMETHICONE 80 MG PO CHEW: 80.0000 mg | CHEWABLE_TABLET | Freq: Four times a day (QID) | ORAL | Status: DC | PRN

## 2015-12-04 MED ORDER — PENTAFLUOROPROP-TETRAFLUOROETH EX AERO: 1.0000 "application " | INHALATION_SPRAY | CUTANEOUS | Status: DC | PRN

## 2015-12-04 MED ORDER — ACETAMINOPHEN 325 MG PO TABS
ORAL_TABLET | ORAL | Status: AC
  Filled 2015-12-04: qty 2

## 2015-12-04 MED ORDER — DARBEPOETIN ALFA 100 MCG/0.5ML IJ SOSY
100.0000 ug | PREFILLED_SYRINGE | INTRAMUSCULAR | Status: DC
Start: 2015-12-11 — End: 2015-12-14
  Administered 2015-12-11: 100 ug via INTRAVENOUS
  Filled 2015-12-04: qty 0.5

## 2015-12-04 MED ORDER — HEPARIN SODIUM (PORCINE) 5000 UNIT/ML IJ SOLN
5000.0000 [IU] | Freq: Three times a day (TID) | INTRAMUSCULAR | Status: DC
  Administered 2015-12-04 – 2015-12-20 (×46): 5000 [IU] via SUBCUTANEOUS
  Filled 2015-12-04 (×46): qty 1

## 2015-12-04 MED ORDER — LIDOCAINE-PRILOCAINE 2.5-2.5 % EX CREA: 1.0000 "application " | TOPICAL_CREAM | CUTANEOUS | Status: DC | PRN

## 2015-12-04 MED ORDER — INSULIN ASPART 100 UNIT/ML ~~LOC~~ SOLN
0.0000 [IU] | Freq: Three times a day (TID) | SUBCUTANEOUS | Status: DC
  Administered 2015-12-04: 4 [IU] via SUBCUTANEOUS
  Administered 2015-12-05: 2 [IU] via SUBCUTANEOUS
  Administered 2015-12-05: 4 [IU] via SUBCUTANEOUS
  Administered 2015-12-05: 2 [IU] via SUBCUTANEOUS
  Administered 2015-12-06: 6 [IU] via SUBCUTANEOUS
  Administered 2015-12-06: 10 [IU] via SUBCUTANEOUS
  Administered 2015-12-07: 4 [IU] via SUBCUTANEOUS
  Administered 2015-12-07: 2 [IU] via SUBCUTANEOUS
  Administered 2015-12-07: 4 [IU] via SUBCUTANEOUS
  Administered 2015-12-08 (×2): 2 [IU] via SUBCUTANEOUS
  Administered 2015-12-10: 4 [IU] via SUBCUTANEOUS
  Administered 2015-12-11: 10 [IU] via SUBCUTANEOUS
  Administered 2015-12-12 (×3): 6 [IU] via SUBCUTANEOUS
  Administered 2015-12-13: 4 [IU] via SUBCUTANEOUS
  Administered 2015-12-14: 2 [IU] via SUBCUTANEOUS
  Administered 2015-12-14: 6 [IU] via SUBCUTANEOUS
  Administered 2015-12-14 – 2015-12-15 (×2): 4 [IU] via SUBCUTANEOUS
  Administered 2015-12-15 – 2015-12-19 (×8): 2 [IU] via SUBCUTANEOUS

## 2015-12-04 MED ORDER — ALUM & MAG HYDROXIDE-SIMETH 200-200-20 MG/5ML PO SUSP: 30.0000 mL | ORAL | Status: DC | PRN

## 2015-12-04 MED ORDER — ALTEPLASE 2 MG IJ SOLR: 2.0000 mg | Freq: Once | INTRAMUSCULAR | Status: DC | PRN

## 2015-12-04 MED ORDER — SODIUM CHLORIDE 0.9 % IV SOLN: 100.0000 mL | INTRAVENOUS | Status: DC | PRN

## 2015-12-04 MED ORDER — ONDANSETRON HCL 4 MG/2ML IJ SOLN
4.0000 mg | Freq: Four times a day (QID) | INTRAMUSCULAR | Status: DC | PRN
  Administered 2015-12-18: 4 mg via INTRAVENOUS
  Filled 2015-12-04: qty 2

## 2015-12-04 MED ORDER — ONDANSETRON HCL 4 MG PO TABS
4.0000 mg | ORAL_TABLET | Freq: Four times a day (QID) | ORAL | Status: DC | PRN
  Filled 2015-12-04: qty 1

## 2015-12-04 MED ORDER — TRAZODONE HCL 50 MG PO TABS: 25.0000 mg | ORAL_TABLET | Freq: Every evening | ORAL | Status: DC | PRN

## 2015-12-04 MED ORDER — GUAIFENESIN-DM 100-10 MG/5ML PO SYRP
5.0000 mL | ORAL_SOLUTION | Freq: Four times a day (QID) | ORAL | Status: DC | PRN
Start: 2015-12-04 — End: 2015-12-20

## 2015-12-04 NOTE — Progress Notes (Signed)
Patient ID: Carolyn Valenzuela, female   DOB: March 31, 1951, 65 y.o.   MRN: [id number] Patient admitted to [phone] via bed, escorted by nursing staff.  Patient verbalized understanding of rehab process, signed fall safety agreement, as patient has been here before.  Appears to be in no immediate distress at this time.  Brita Romp, RN

## 2015-12-04 NOTE — Progress Notes (Signed)
Rept given to [employer]. Pt to be transferred to CIR today. No change from 1300 assessment.

## 2015-12-04 NOTE — Clinical Social Work Note (Signed)
Patient to be admitted to CIR on 12/04/2015. CSW signing off.  Lubertha Sayres, [place]: [phone] Surgical: [phone]

## 2015-12-04 NOTE — H&P (View-Only) (Signed)
Physical Medicine and Rehabilitation Admission H&P    CC: R-BKA due to right foot gangrene and PAD  HPI: Carolyn Valenzuela is a 65 y.o. 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 She was admitted on 11/29/15 for L-BKA by Dr. Sharol Given.  Post op with sedation due to narcotics but has been showing good motivation with improvement in mentation.  Noted to have significant acute on chronic anemia with drop in H/H to 5.9/17.7 and to be transfused with 2 units PRBC today.  Dr. Sharol Given recommends continuing Prevana wound VAC for 1 week post surgery.  Therapy ongoing and CIR recommended by therapy team to help patient achieve prior level of independence with ADL tasks and mobility.     Review of Systems  HENT: Negative for hearing loss.   Eyes: Negative for blurred vision and double vision.  Respiratory: Positive for shortness of breath. Negative for cough.   Cardiovascular: Negative for chest pain and palpitations.  Gastrointestinal: Positive for heartburn and constipation. Negative for vomiting.  Genitourinary: Negative for dysuria and urgency.  Musculoskeletal: Positive for myalgias and back pain (due to being in bed).  Neurological: Positive for dizziness, speech change and weakness (wants better food choices so she can get better). Negative for headaches.  Psychiatric/Behavioral: Negative for depression. The patient is not nervous/anxious and does not have insomnia.   All other systems reviewed and are negative.    Past Medical History  Diagnosis Date  . DIABETES MELLITUS, UNCONTROLLED 05/21/2009  . HYPERLIPIDEMIA 03/30/2007  . ANEMIA-IRON DEFICIENCY 01/25/2008  . HYPERTENSION 01/25/2008  . SINUSITIS- ACUTE-NOS 01/25/2008  . SECONDARY HYPERPARATHYROIDISM 05/21/2009  . CERVICAL RADICULOPATHY, LEFT 01/25/2008  . BACK  PAIN 05/02/2009  . NUMBNESS 05/21/2009  . Allergic rhinitis, cause unspecified 12/08/2010  . Foot ulcer ([place])     right lateral malleolus  . Critical lower limb ischemia   . RENAL INSUFFICIENCY 02/01/2008    HD pt.    Past Surgical History  Procedure Laterality Date  . Av fistula placement Left   . Eye surgery Bilateral     cataracts removed, left eye still has some oil in it.  . Colonoscopy    . Amputation Right 03/15/2014    Procedure: RIGHT  LEG  BELOW KNEE AMPUTATION ;  Surgeon: [name], MD;  Location: [place];  Service: Orthopedics;  Laterality: Right;  . Insertion of dialysis catheter N/A 09/03/2014    Procedure: INSERTION OF DIALYSIS CATHETER RIGHT INTERNAL JUGULAR VEIN;  Surgeon: Conrad Cottageville, MD;  Location: [place];  Service: Vascular;  Laterality: N/A;  . Av fistula placement Left 09/06/2014    Procedure: ARTERIOVENOUS (AV) FISTULA CREATION-Left Brachiocephalic;  Surgeon: Conrad Osseo, MD;  Location: [place];  Service: Vascular;  Laterality: Left;  . Peripheral vascular catheterization N/A 08/13/2015    Procedure: Abdominal Aortogram;  Surgeon: Elam Dutch, MD;  Location: [facility];  Service: Cardiovascular;  Laterality: N/A;  . Amputation Left 11/29/2015    Procedure: AMPUTATION BELOW KNEE;  Surgeon: Newt Minion, MD;  Location: [place];  Service: Orthopedics;  Laterality: Left;    Family History  Problem Relation Age of Onset  . Cancer Mother     Pancreatic    Social History:  Lives alone and independent PTA. Used to work in Estate manager/land agent at [employer]. Has prosthesis RLE and  was ambulating short distances with RW. Family in town checks in on patient. She reports that she has never smoked. She has never used smokeless tobacco. She reports that she does not drink alcohol or use illicit drugs.    Allergies  Allergen Reactions  . Oxycodone Itching    Medications Prior to Admission  Medication Sig Dispense Refill  . acetaminophen (TYLENOL) 325 MG tablet Take 2 tablets (650 mg  total) by mouth every 6 (six) hours as needed for mild pain, moderate pain or fever.    [name] amLODipine (NORVASC) 10 MG tablet TAKE 1 TABLET BY MOUTH EVERY DAY 30 tablet 5  . aspirin 81 MG EC tablet Take 81 mg by mouth daily.      . cloNIDine (CATAPRES) 0.1 MG tablet TAKE 1 TABLET BY MOUTH TWICE A DAY 60 tablet 5  . furosemide (LASIX) 40 MG tablet TAKE 2 TABLETS BY MOUTH EVERY MORNING AND TAKE 1 TABLET EVERY EVENING 90 tablet 3  . gabapentin (NEURONTIN) 100 MG capsule Take 1 capsule by mouth 2 (two) times daily.  3  . glipiZIDE (GLUCOTROL XL) 5 MG 24 hr tablet Take 1 tablet (5 mg total) by mouth daily with breakfast. 90 tablet 3  . HYDROcodone-acetaminophen (NORCO/VICODIN) 5-325 MG tablet TAKE 1 TABLET 2 TO 3 TIMES A DAY AS NEEDED FOR PAIN  0  . HYDROmorphone (DILAUDID) 2 MG tablet Take 1 tablet (2 mg total) by mouth every 6 (six) hours as needed for severe pain. 15 tablet 0  . Multiple Vitamin (MULTIVITAMIN WITH MINERALS) TABS tablet Take 1 tablet by mouth daily.    . nitroGLYCERIN (NITRODUR - DOSED IN MG/24 HR) 0.2 mg/hr patch APPLY 1 PATCH DAILY. REMOVE OLD PATCH DAILY. CHANGE PATCH LOCATION DAILY.  3  . oxyCODONE-acetaminophen (PERCOCET/ROXICET) 5-325 MG tablet Take 1 tablet by mouth every 6 (six) hours as needed for moderate pain or severe pain.    [name] RENVELA 800 MG tablet Take 1,600 mg by mouth 3 (three) times daily with meals.     . simvastatin (ZOCOR) 20 MG tablet Take 1 tablet (20 mg total) by mouth daily at 6 PM. 90 tablet 3    Home: Home Living Family/patient expects to be discharged to:: Private residence Living Arrangements: Alone Available Help at Discharge:  (none) Type of Home: Apartment Home Access: Ramped entrance Home Layout: One level Bathroom Shower/Tub: Tub/shower unit, Architectural technologist: Standard Home Equipment: Environmental consultant - 2 wheels, [employer] - quad, Wheelchair - manual   Functional History: Prior Function Level of Independence: Independent  Functional Status:    Mobility: Bed Mobility Overal bed mobility: Needs Assistance Bed Mobility: Supine to Sit Supine to sit: HOB elevated, Mod assist General bed mobility comments: cues for technique with assist to elevate trunk into sitting and bring hips to EOB with use of bedpad; use of bedrails Transfers Overall transfer level: Needs assistance Equipment used: Rolling walker (2 wheeled), 2 person hand held assist Transfers: Sit to/from Stand, Stand Pivot Transfers Sit to Stand: Mod assist, +2 physical assistance, +2 safety/equipment, Min assist Stand pivot transfers: Max assist, +2 physical assistance, +2 safety/equipment General transfer comment: mod A to power up into standing with cues for safe hand placement and technique and min A to maintain balance in standing with verbal and tactile cues for posture and weight shifting; pt with improved balance and ability to stand on R LE; max A to pivot to chair but demonstrated abiltiy to pivot R foot this session  ADL: ADL Overall ADL's : Needs assistance/impaired Eating/Feeding: Independent, Bed level Grooming: Set up, Supervision/safety, Sitting (in recliner) Upper Body Bathing: Set up, Supervision/ safety, Sitting (in recliner) Lower Body Bathing: Total assistance (in recliner) Upper Body Dressing : Maximal assistance, Sitting (in recliner) Lower Body Dressing: Total assistance, Bed level Toilet Transfer: +2 for physical assistance, Maximal assistance, Stand-pivot (bed>recliner) Toileting- Clothing Manipulation and Hygiene: Total assistance, +2 for physical assistance, Sit to/from stand General ADL Comments: Min A to don prothesis  Cognition: Cognition Overall Cognitive Status: Within Functional Limits for tasks assessed Orientation Level: Oriented X4 Cognition Arousal/Alertness: Awake/alert Behavior During Therapy: WFL for tasks assessed/performed Overall Cognitive Status: Within Functional Limits for tasks assessed    Blood pressure  136/51, pulse 77, temperature 97.7 F (36.5 C), temperature source Oral, resp. rate 18, height '4\' 11"'  (1.499 m), weight 53.4 kg (117 lb 11.6 oz), SpO2 98 %. Physical Exam  Nursing note and vitals reviewed. Constitutional: She is oriented to person, place, and time. She appears well-developed. She has a sickly appearance. Nasal cannula in place.  HENT:  Head: Normocephalic and atraumatic.  Mouth/Throat: Oropharynx is clear and moist.  Eyes: Conjunctivae and EOM are normal. Pupils are equal, round, and reactive to light.  Neck: Normal range of motion. Neck supple.  Cardiovascular: Normal rate and regular rhythm.   No murmur heard. Respiratory: Effort normal and breath sounds normal. No respiratory distress. She has no wheezes.  GI: Soft. Bowel sounds are normal. She exhibits no distension. There is no tenderness.  Musculoskeletal: She exhibits tenderness. She exhibits no edema.  VAC on place on L-BKA amp site.  R-BKA healed well--bandaid on knee (to protect from prosthesis).   Neurological: She is alert and oriented to person, place, and time.  Motor: Bilateral upper extremities: 4+/5 proximal to distal Right lower extremity: BKA, 4+/5 hip flexion next line left lower extremity: Hip flexion 4+/5 (pain inhibition)  Skin: Skin is warm and dry.  VAC on left BKA, suctioning without leaks.  Psychiatric: She has a normal mood and affect. Her behavior is normal. Thought content normal.    Results for orders placed or performed during the hospital encounter of 11/29/15 (from the past 48 hour(s))  Glucose, capillary     Status: Abnormal   Collection Time: 12/02/15 11:40 AM  Result Value Ref Range   Glucose-Capillary 58 (L) 65 - 99 mg/dL  Glucose, capillary     Status: None   Collection Time: 12/02/15  1:30 PM  Result Value Ref Range   Glucose-Capillary 83 65 - 99 mg/dL  Glucose, capillary     Status: None   Collection Time: 12/02/15  3:12 PM  Result Value Ref Range   Glucose-Capillary 81 65  - 99 mg/dL  Glucose, capillary     Status: None   Collection Time: 12/02/15  3:51 PM  Result Value Ref Range   Glucose-Capillary 80 65 - 99 mg/dL  Glucose, capillary     Status: Abnormal   Collection Time: 12/02/15  6:37 PM  Result Value Ref Range   Glucose-Capillary 49 (L) 65 - 99 mg/dL  Glucose, capillary     Status: None   Collection Time: 12/02/15  6:56 PM  Result Value Ref Range   Glucose-Capillary 97 65 - 99 mg/dL  Glucose, capillary     Status: None   Collection Time: 12/02/15  7:29 PM  Result Value Ref Range   Glucose-Capillary 91 65 - 99 mg/dL  Glucose, capillary     Status: None  Collection Time: 12/02/15 10:10 PM  Result Value Ref Range   Glucose-Capillary 75 65 - 99 mg/dL  Glucose, capillary     Status: Abnormal   Collection Time: 12/03/15 12:04 AM  Result Value Ref Range   Glucose-Capillary 107 (H) 65 - 99 mg/dL  Glucose, capillary     Status: Abnormal   Collection Time: 12/03/15  2:23 AM  Result Value Ref Range   Glucose-Capillary 56 (L) 65 - 99 mg/dL  Glucose, capillary     Status: None   Collection Time: 12/03/15  4:16 AM  Result Value Ref Range   Glucose-Capillary 67 65 - 99 mg/dL  Glucose, capillary     Status: Abnormal   Collection Time: 12/03/15  6:41 AM  Result Value Ref Range   Glucose-Capillary 32 (LL) 65 - 99 mg/dL  Glucose, capillary     Status: Abnormal   Collection Time: 12/03/15  7:00 AM  Result Value Ref Range   Glucose-Capillary 206 (H) 65 - 99 mg/dL  Glucose, capillary     Status: Abnormal   Collection Time: 12/03/15  8:14 AM  Result Value Ref Range   Glucose-Capillary 182 (H) 65 - 99 mg/dL  Glucose, capillary     Status: Abnormal   Collection Time: 12/03/15 10:05 AM  Result Value Ref Range   Glucose-Capillary 118 (H) 65 - 99 mg/dL  Glucose, capillary     Status: Abnormal   Collection Time: 12/03/15 12:10 PM  Result Value Ref Range   Glucose-Capillary 111 (H) 65 - 99 mg/dL   Comment 1 Notify RN   Glucose, capillary     Status:  Abnormal   Collection Time: 12/03/15  2:26 PM  Result Value Ref Range   Glucose-Capillary 101 (H) 65 - 99 mg/dL  Glucose, capillary     Status: Abnormal   Collection Time: 12/03/15  4:31 PM  Result Value Ref Range   Glucose-Capillary 103 (H) 65 - 99 mg/dL  Glucose, capillary     Status: None   Collection Time: 12/03/15  6:24 PM  Result Value Ref Range   Glucose-Capillary 94 65 - 99 mg/dL  Glucose, capillary     Status: Abnormal   Collection Time: 12/03/15 10:13 PM  Result Value Ref Range   Glucose-Capillary 126 (H) 65 - 99 mg/dL  Glucose, capillary     Status: Abnormal   Collection Time: 12/04/15 12:47 AM  Result Value Ref Range   Glucose-Capillary 110 (H) 65 - 99 mg/dL  Glucose, capillary     Status: Abnormal   Collection Time: 12/04/15  2:17 AM  Result Value Ref Range   Glucose-Capillary 105 (H) 65 - 99 mg/dL  Glucose, capillary     Status: None   Collection Time: 12/04/15  5:28 AM  Result Value Ref Range   Glucose-Capillary 98 65 - 99 mg/dL  Glucose, capillary     Status: None   Collection Time: 12/04/15  6:43 AM  Result Value Ref Range   Glucose-Capillary 98 65 - 99 mg/dL  CBC     Status: Abnormal   Collection Time: 12/04/15  8:29 AM  Result Value Ref Range   WBC 8.1 4.0 - 10.5 K/uL   RBC 1.93 (L) 3.87 - 5.11 MIL/uL   Hemoglobin 5.9 (LL) 12.0 - 15.0 g/dL    Comment: REPEATED TO VERIFY CRITICAL RESULT CALLED TO, READ BACK BY AND VERIFIED WITH: Z.CALWIDAN,RN 12/04/15 '@0901'  BY V.WILKINS    HCT 17.7 (L) 36.0 - 46.0 %   MCV 91.7 78.0 - 100.0 fL  MCH 30.6 26.0 - 34.0 pg   MCHC 33.3 30.0 - 36.0 g/dL   RDW 15.4 11.5 - 15.5 %   Platelets 359 150 - 400 K/uL  Renal function panel     Status: Abnormal   Collection Time: 12/04/15  8:29 AM  Result Value Ref Range   Sodium 123 (L) 135 - 145 mmol/L   Potassium 5.2 (H) 3.5 - 5.1 mmol/L   Chloride 85 (L) 101 - 111 mmol/L   CO2 23 22 - 32 mmol/L   Glucose, Bld 111 (H) 65 - 99 mg/dL   BUN 41 (H) 6 - 20 mg/dL   Creatinine,  Ser 7.16 (H) 0.44 - 1.00 mg/dL   Calcium 8.0 (L) 8.9 - 10.3 mg/dL   Phosphorus 6.6 (H) 2.5 - 4.6 mg/dL   Albumin 2.1 (L) 3.5 - 5.0 g/dL   GFR calc non Af Amer 5 (L) >60 mL/min   GFR calc Af Amer 6 (L) >60 mL/min    Comment: (NOTE) The eGFR has been calculated using the CKD EPI equation. This calculation has not been validated in all clinical situations. eGFR's persistently <60 mL/min signify possible Chronic Kidney Disease.    Anion gap 15 5 - 15  Type and screen [place]     Status: None (Preliminary result)   Collection Time: 12/04/15  9:10 AM  Result Value Ref Range   ABO/RH(D) O POS    Antibody Screen NEG    Sample Expiration 12/07/2015    Unit Number [id number]    Blood Component Type RBC LR PHER2    Unit division 00    Status of Unit ISSUED    Transfusion Status OK TO TRANSFUSE    Crossmatch Result Compatible    Unit Number [id number]    Blood Component Type RED CELLS,LR    Unit division 00    Status of Unit ISSUED    Transfusion Status OK TO TRANSFUSE    Crossmatch Result Compatible   Prepare RBC     Status: None   Collection Time: 12/04/15  9:20 AM  Result Value Ref Range   Order Confirmation ORDER PROCESSED BY BLOOD BANK    Ct Head Wo Contrast  12/02/2015  CLINICAL DATA:  Initial evaluation for acute altered mental status. EXAM: CT HEAD WITHOUT CONTRAST TECHNIQUE: Contiguous axial images were obtained from the base of the skull through the vertex without intravenous contrast. COMPARISON:  None available. FINDINGS: Generalized cerebral atrophy with mild chronic small vessel ischemic disease present. Prominent vascular calcifications within the carotid siphons. No acute large vessel territory infarct. No intracranial hemorrhage. No mass lesion, midline shift, or mass effect. No hydrocephalus. No extra-axial fluid collection. Scalp soft tissues demonstrate no acute abnormality. No acute abnormality about the orbits. Visualized paranasal sinuses and  mastoid air cells are clear. Calvarium intact. IMPRESSION: 1. No acute intracranial process. 2. Mild age-related cerebral atrophy with chronic small vessel ischemic disease. Prominent intracranial atherosclerosis. Electronically Signed   By: Jeannine Boga M.D.   On: 12/02/2015 21:33   Dg Chest Port 1 View  12/03/2015  CLINICAL DATA:  Hypoxemia EXAM: PORTABLE CHEST 1 VIEW COMPARISON:  11/29/2015 FINDINGS: Normal heart size. Lungs clear. No pneumothorax. No pleural effusion. IMPRESSION: No active disease. Electronically Signed   By: Marybelle Killings M.D.   On: 12/03/2015 11:58     Medical Problem List and Plan: 1.  Abnormality of gait, safety, deficits with transfers secondary to left BKA, with previous right BKA. 2.  DVT Prophylaxis/Anticoagulation: Pharmaceutical:  Lovenox 3. Pain Management:  Hydrocodone prn for pain. Will add low dose robaxin to help with muscle spasms.  4. Mood: Very motivated to get better and resume independent living. LCSW to follow for evaluation and support.  5. Neuropsych: This patient is capable of making decisions on her own behalf. 6. Skin/Wound Care: Routine pressure relief measures. Nephro to help promote wound healing.  7. Fluids/Electrolytes/Nutrition: Labs being monitored ---hyponateremia/hyperkalemia being managed by HD.   8. ESRD:  On renal diet with 1200 FR. Hemodialysis after therapy sessions on MWF to help with tolerance of therapy.  9. DMT2: Monitor BS ac/hs. Continue to hold glipizide 5 mg due to low BS and poor intake.  Reports feels light headed and weak if BS < 150. Will remove diabetic retrictions to help with food choices/intake. Use SSI for elevated BS.  10 HTN: Monitor BID. Has been low due to anemia and deconditioning. Monitor for now.  11. Anemia of chronic disease: Started on Aransep weekly.  12. Metabolic bone disease:  On renvela.  13. Hypoxia: Encourage IS. Should improve with transfusion.    Post Admission Physician  Evaluation: 1. Functional deficits secondary to Left transtibial amputation, with previous right BKA. 2. Patient is admitted to receive collaborative, interdisciplinary care between the physiatrist, rehab nursing staff, and therapy team. 3. Patient's level of medical complexity and substantial therapy needs in context of that medical necessity cannot be provided at a lesser intensity of care such as a SNF. 4. Patient has experienced substantial functional loss from his/her baseline which was documented above under the "Functional History" and "Functional Status" headings.  Judging by the patient's diagnosis, physical exam, and functional history, the patient has potential for functional progress which will result in measurable gains while on inpatient rehab.  These gains will be of substantial and practical use upon discharge  in facilitating mobility and self-care at the household level. 5. Physiatrist will provide 24 hour management of medical needs as well as oversight of the therapy plan/treatment and provide guidance as appropriate regarding the interaction of the two. 6. 24 hour rehab nursing will assist with safety, skin/wound care, disease management, pain management and patient education and help integrate therapy concepts, techniques,education, etc. 7. PT will assess and treat for/with: Lower extremity strength, range of motion, stamina, balance, functional mobility, safety, adaptive techniques and equipment, woundcare, coping skills, pain control, education.   Goals are: Mod I at wheelchair. 8. OT will assess and treat for/with: ADL's, functional mobility, safety, upper extremity strength, adaptive techniques and equipment, wound mgt, ego support, and community reintegration.   Goals are: Mod I at wheelchair. Therapy may not proceed with showering this patient.. 9. Case Management and Social Worker will assess and treat for psychological issues and discharge planning. 10. Team conference will  be held weekly to assess progress toward goals and to determine barriers to discharge. 11. Patient will receive at least 3 hours of therapy per day at least 5 days per week. 12. ELOS: 13-18 days.       13. Prognosis:  good   Delice Lesch, MD  12/04/2015

## 2015-12-04 NOTE — PMR Pre-admission (Signed)
PMR Admission Coordinator Pre-Admission Assessment  Patient: Carolyn Valenzuela is an 65 y.o., female MRN: [id number] DOB: 10-Dec-1950 Height: _0  (149.9 cm) Weight: 51 kg (112 lb 7 oz)              Insurance Information HMO:     PPO:      PCP:      IPA:      80/20:      OTHER:  PRIMARY: BCBS State      Policy#: [id number]      Subscriber:  self CM Name:  Philis Fendt      Phone#:  [phone]     Fax#:  [phone] Pre-Cert#:  [id number], authorized for 12/04/15-12/17/15 with clinical updates due 12/17/15      Employer:  retired Benefits:  Phone #:  [phone]     Name:  [employer]. Date:  09/01/15     Deduct:  $1250      Out of Pocket Max:  $4350      Life Max:  n/a CIR:  $450 copay then 20% after deductible met      SNF:  80/20%; per Philis Fendt, BCBS will not cover SNF following this IP rehab admission Outpatient:  80%     Co-Pay:  20% Home Health:  80%      Co-Pay:  20% DME:  80%     Co-Pay:  20% Providers:  In network SECONDARY:       Policy#:       Subscriber:  CM Name:       Phone#:      Fax#:  Pre-Cert#:       Employer:  Benefits:  Phone #:      Name:  Eff. Date:      Deduct:       Out of Pocket Max:       Life Max:  CIR:       SNF:  Outpatient:      Co-Pay:  Home Health:       Co-Pay:  DME:      Co-Pay:   Medicaid Application Date:       Case Manager:  Disability Application Date:       Case Worker:   Emergency Contact Information Contact Information    Name Relation Home Work Mobile   [place] Sister   [phone]   [name] Sister [phone]     Ayjah, Show   [phone]     Current Medical History  Patient Admitting Diagnosis: Left transtibial amputation History of Present Illness: Carolyn Valenzuela is a 65 y.o. 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  She was admitted on 11/29/15 for L-BKA by Dr. Sharol Given. Post op with sedation due to narcotics but has been showing good motivation with improvement in mentation. Noted to have significant acute on chronic anemia with drop in H/H to 5.9/17.7 and to be transfused with 2 units PRBC today. Dr. Sharol Given recommends continuing Prevana wound VAC for 1 week post surgery. Therapy ongoing and CIR recommended by therapy team to help patient achieve prior level of independence with ADL tasks and mobility.       Past Medical History  Past Medical History  Diagnosis Date  . DIABETES MELLITUS, UNCONTROLLED 05/21/2009  . HYPERLIPIDEMIA 03/30/2007  . ANEMIA-IRON DEFICIENCY 01/25/2008  . HYPERTENSION 01/25/2008  .  SINUSITIS- ACUTE-NOS 01/25/2008  . SECONDARY HYPERPARATHYROIDISM 05/21/2009  . CERVICAL RADICULOPATHY, LEFT 01/25/2008  . BACK PAIN 05/02/2009  . NUMBNESS 05/21/2009  . Allergic rhinitis, cause unspecified 12/08/2010  . Foot ulcer ([place])     right lateral malleolus  . Critical lower limb ischemia   . RENAL INSUFFICIENCY 02/01/2008    HD pt.    Family History  family history includes Cancer in her mother.  Prior Rehab/Hospitalizations:  Has the patient had major surgery during 100 days prior to admission? No  Current Medications   Current facility-administered medications:  .  0.9 %  sodium chloride infusion, , Intravenous, Continuous, Meridee Score V, MD, Last Rate: 10 mL/hr at 11/29/15 2230 .  0.9 %  sodium chloride infusion, , Intravenous, Continuous, Meridee Score V, MD, Last Rate: 10 mL/hr at 11/29/15 2230 .  0.9 %  sodium chloride infusion, , Intravenous, Once, Roney Jaffe, MD .  0.9 %  sodium chloride infusion, , Intravenous, Once, Roney Jaffe, MD .  acetaminophen (TYLENOL) 325 MG tablet, , , ,  .  acetaminophen (TYLENOL) tablet 650 mg, 650 mg, Oral, Q6H PRN, 650 mg at 12/04/15 1124 **OR** acetaminophen (TYLENOL) suppository 650 mg, 650 mg, Rectal, Q6H PRN, Meridee Score V, MD .  bisacodyl  (DULCOLAX) suppository 10 mg, 10 mg, Rectal, Daily PRN, Newt Minion, MD .  Darbepoetin Alfa (ARANESP) 100 MCG/0.5ML injection, , , ,  .  Darbepoetin Alfa (ARANESP) injection 100 mcg, 100 mcg, Intravenous, Q Wed-HD, Ernest Haber, PA-C, 100 mcg at 12/04/15 1143 .  docusate sodium (COLACE) capsule 100 mg, 100 mg, Oral, BID, Meridee Score V, MD, 100 mg at 12/04/15 1320 .  feeding supplement (NEPRO CARB STEADY) liquid 237 mL, 237 mL, Oral, TID WC, David Zeyfang, PA-C, 237 mL at 12/04/15 1200 .  gabapentin (NEURONTIN) capsule 100 mg, 100 mg, Oral, BID, Meridee Score V, MD, 100 mg at 12/04/15 1321 .  glipiZIDE (GLUCOTROL XL) 24 hr tablet 5 mg, 5 mg, Oral, Q breakfast, Newt Minion, MD, Stopped at 12/02/15 [phone] .  HYDROcodone-acetaminophen (NORCO) 10-325 MG per tablet 1-2 tablet, 1-2 tablet, Oral, Q4H PRN, Newt Minion, MD, 1 tablet at 12/04/15 0724 .  HYDROmorphone (DILAUDID) injection 1 mg, 1 mg, Intravenous, Q2H PRN, Meridee Score V, MD, 1 mg at 11/30/15 0415 .  insulin aspart (novoLOG) injection 0-9 Units, 0-9 Units, Subcutaneous, TID WC, Newt Minion, MD, 1 Units at 11/30/15 1752 .  [DISCONTINUED] metoCLOPramide (REGLAN) tablet 5-10 mg, 5-10 mg, Oral, Q8H PRN **OR** metoCLOPramide (REGLAN) injection 5-10 mg, 5-10 mg, Intravenous, Q8H PRN, Meridee Score V, MD .  metoCLOPramide (REGLAN) tablet 5-10 mg, 5-10 mg, Oral, Q8H PRN **OR** [DISCONTINUED] metoCLOPramide (REGLAN) injection 5-10 mg, 5-10 mg, Intravenous, Q8H PRN, Meridee Score V, MD .  ondansetron (ZOFRAN) tablet 4 mg, 4 mg, Oral, Q6H PRN **OR** ondansetron (ZOFRAN) injection 4 mg, 4 mg, Intravenous, Q6H PRN, Meridee Score V, MD .  polyethylene glycol (MIRALAX / GLYCOLAX) packet 17 g, 17 g, Oral, Daily PRN, Meridee Score V, MD, 17 g at 12/04/15 1321 .  sevelamer carbonate (RENVELA) tablet 1,600 mg, 1,600 mg, Oral, TID WC, Meridee Score V, MD, 1,600 mg at 12/04/15 1321 .  simvastatin (ZOCOR) tablet 20 mg, 20 mg, Oral, q1800, Newt Minion, MD, 20 mg at 12/04/15  0050  Patients Current Diet: Diet renal/carb modified with fluid restriction Diet-HS Snack?: Nothing; Room service appropriate?: Yes; Fluid consistency:: Thin Diet - low sodium heart healthy  Precautions / Restrictions Precautions Precautions: Fall  Precaution Comments: wound vac Other Brace/Splint: has prosthetic for RLE Restrictions Weight Bearing Restrictions: Yes LLE Weight Bearing: Non weight bearing   Has the patient had 2 or more falls or a fall with injury in the past year?No  Prior Activity Level Community (5-7x/wk): Pt. states she goes out 3x/week via Teaching laboratory technician.  Pt. also goes to church Sundays, out to the grocery store and shopping center a couple times per week.    Home Assistive Devices / Equipment Home Assistive Devices/Equipment: Wheelchair, Environmental consultant (specify type), Kasandra Knudsen (specify quad or straight) Home Equipment: Walker - 2 wheels, Cane - quad, Wheelchair - manual  Prior Device Use: Indicate devices/aids used by the patient prior to current illness, exacerbation or injury? None of the above  Prior Functional Level Prior Function Level of Independence: Independent  Self Care: Did the patient need help bathing, dressing, using the toilet or eating?  Independent  Indoor Mobility: Did the patient need assistance with walking from room to room (with or without device)? Independent  Stairs: Did the patient need assistance with internal or external stairs (with or without device)? N/a, pt. Has ramped entrance  Functional Cognition: Did the patient need help planning regular tasks such as shopping or remembering to take medications? Independent  Current Functional Level Cognition  Overall Cognitive Status: Within Functional Limits for tasks assessed Orientation Level: Oriented X4, Oriented to person, Oriented to place, Oriented to time, Oriented to situation    Extremity Assessment (includes Sensation/Coordination)  Upper Extremity Assessment:  Generalized weakness  Lower Extremity Assessment: LLE deficits/detail LLE Deficits / Details: limited ROM in knee flexion due to pain; otherwise, ROM WFL; strength not tested due to pain but able to move all planes against gravity    ADLs  Overall ADL's : Needs assistance/impaired Eating/Feeding: Independent, Bed level Grooming: Set up, Supervision/safety, Sitting (in recliner) Upper Body Bathing: Set up, Supervision/ safety, Sitting (in recliner) Lower Body Bathing: Total assistance (in recliner) Upper Body Dressing : Maximal assistance, Sitting (in recliner) Lower Body Dressing: Total assistance, Bed level Toilet Transfer: +2 for physical assistance, Maximal assistance, Stand-pivot (bed>recliner) Toileting- Clothing Manipulation and Hygiene: Total assistance, +2 for physical assistance, Sit to/from stand General ADL Comments: Min A to don prothesis    Mobility  Overal bed mobility: Needs Assistance Bed Mobility: Supine to Sit Supine to sit: HOB elevated, Mod assist General bed mobility comments: cues for technique with assist to elevate trunk into sitting and bring hips to EOB with use of bedpad; use of bedrails    Transfers  Overall transfer level: Needs assistance Equipment used: Rolling walker (2 wheeled), 2 person hand held assist Transfers: Sit to/from Stand, Stand Pivot Transfers Sit to Stand: Mod assist, +2 physical assistance, +2 safety/equipment, Min assist Stand pivot transfers: Max assist, +2 physical assistance, +2 safety/equipment General transfer comment: mod A to power up into standing with cues for safe hand placement and technique and min A to maintain balance in standing with verbal and tactile cues for posture and weight shifting; pt with improved balance and ability to stand on R LE; max A to pivot to chair but demonstrated abiltiy to pivot R foot this session     Ambulation / Gait / Stairs / Wheelchair Mobility       Posture / Balance Dynamic Sitting  Balance Sitting balance - Comments: improved sitting balance  Balance Overall balance assessment: Needs assistance Sitting-balance support: Feet unsupported, Single extremity supported Sitting balance-Leahy Scale: Fair Sitting balance - Comments: improved sitting balance  Standing balance support: Bilateral upper extremity supported Standing balance-Leahy Scale: Poor    Special needs/care consideration BiPAP/CPAP  no CPM   no Continuous Drip IV   no Dialysis  yes        Days  MWF Life Vest   no Oxygen   no Special Bed   no Trach Size   no Wound Vac (area)   Yes       Location L BKA site Skin   Wound vac on left BKA surgical incision                            Bowel mgmt:   Last BM 11/29/15, continent at home Bladder mgmt:   Has not produced urine while in hospital but produces urine at home Diabetic mgmt  Yes     Previous Home Environment Living Arrangements: Alone Available Help at Discharge:  (none) Type of Home: Apartment Home Layout: One level Home Access: Ramped entrance Bathroom Shower/Tub: Tub/shower unit, Architectural technologist: Standard Bathroom Accessibility: Yes How Accessible: Accessible via walker Outlook: No  Discharge Living Setting Plans for Discharge Living Setting: Patient's home Type of Home at Discharge: Apartment Discharge Home Layout: One level Discharge Home Access: [place] entrance Discharge Bathroom Shower/Tub: [place] unit Discharge Bathroom Toilet: Standard Discharge Bathroom Accessibility: Yes How Accessible: Accessible via walker Does the patient have any problems obtaining your medications?: No  Social/Family/Support Systems Patient Roles: Other (Comment) (sibling) Anticipated Caregiver: Pt. is anticipated to need intermittent supervision which will be provided by sisters, niece and girlfriend Anticipated Caregiver's Contact Information: see emergency contacts Ability/Limitations of Caregiver: family and friends available  to supervise intermittently Caregiver Availability: Intermittent Discharge Plan Discussed with Primary Caregiver: Yes (discussed with sister, [name]) Is Caregiver In Agreement with Plan?: Yes Does Caregiver/Family have Issues with Lodging/Transportation while Pt is in Rehab?: No   Goals/Additional Needs Patient/Family Goal for Rehab: intermittent supervision at wheelchair level for PT/OT tasks; n/a SLP Expected length of stay: 12-15 days Cultural Considerations: n/a Dietary Needs: renal/carb mooified with fluid restrictions, thin liquids Equipment Needs: TBA Pt/Family Agrees to Admission and willing to participate: Yes Program Orientation Provided & Reviewed with Pt/Caregiver Including Roles  & Responsibilities: Yes   Decrease burden of Care through IP rehab admission: n/a   Possible need for SNF placement upon discharge: not anticipated   Patient Condition: This patient's medical and functional status has changed since the consult dated: 12/02/15 in which the Rehabilitation Physician determined and documented that the patient's condition is appropriate for intensive rehabilitative care in an inpatient rehabilitation facility. See "History of Present Illness" (above) for medical update. Functional changes are:  Pt. Able to donn prosthesis independently at EOB,+2 mod assist sit to stand and +2 max assist stand pivot transfers with prosthesis . Patient's medical and functional status update has been discussed with the Rehabilitation physician and patient remains appropriate for inpatient rehabilitation. Will admit to inpatient rehab today.  Preadmission Screen Completed By:  Gerlean Ren, 12/04/2015 4:12 PM ______________________________________________________________________   Discussed status with Dr.  Posey Pronto on 12/04/15 at  1612  and received telephone approval for admission today.  Admission Coordinator:  Gerlean Ren, time 3151 /Date 12/04/15

## 2015-12-04 NOTE — Progress Notes (Signed)
Rept to Dr. Sharol Given regarding pt going to dialysis and receiving blood transfusion for hgb of 5.9. Pt blood sugars have been stable. Per Dr. Jess Barters order. Proceed with transfer to CIR.

## 2015-12-04 NOTE — H&P (Signed)
Physical Medicine and Rehabilitation Admission H&P    CC: R-BKA due to right foot gangrene and PAD  HPI: Carolyn Valenzuela is a 65 y.o. 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 She was admitted on 11/29/15 for L-BKA by Dr. Sharol Given.  Post op with sedation due to narcotics but has been showing good motivation with improvement in mentation.  Noted to have significant acute on chronic anemia with drop in H/H to 5.9/17.7 and to be transfused with 2 units PRBC today.  Dr. Sharol Given recommends continuing Prevana wound VAC for 1 week post surgery.  Therapy ongoing and CIR recommended by therapy team to help patient achieve prior level of independence with ADL tasks and mobility.     Review of Systems  HENT: Negative for hearing loss.   Eyes: Negative for blurred vision and double vision.  Respiratory: Positive for shortness of breath. Negative for cough.   Cardiovascular: Negative for chest pain and palpitations.  Gastrointestinal: Positive for heartburn and constipation. Negative for vomiting.  Genitourinary: Negative for dysuria and urgency.  Musculoskeletal: Positive for myalgias and back pain (due to being in bed).  Neurological: Positive for dizziness, speech change and weakness (wants better food choices so she can get better). Negative for headaches.  Psychiatric/Behavioral: Negative for depression. The patient is not nervous/anxious and does not have insomnia.   All other systems reviewed and are negative.    Past Medical History  Diagnosis Date  . DIABETES MELLITUS, UNCONTROLLED 05/21/2009  . HYPERLIPIDEMIA 03/30/2007  . ANEMIA-IRON DEFICIENCY 01/25/2008  . HYPERTENSION 01/25/2008  . SINUSITIS- ACUTE-NOS 01/25/2008  . SECONDARY HYPERPARATHYROIDISM 05/21/2009  . CERVICAL RADICULOPATHY, LEFT 01/25/2008  . BACK  PAIN 05/02/2009  . NUMBNESS 05/21/2009  . Allergic rhinitis, cause unspecified 12/08/2010  . Foot ulcer ([place])     right lateral malleolus  . Critical lower limb ischemia   . RENAL INSUFFICIENCY 02/01/2008    HD pt.    Past Surgical History  Procedure Laterality Date  . Av fistula placement Left   . Eye surgery Bilateral     cataracts removed, left eye still has some oil in it.  . Colonoscopy    . Amputation Right 03/15/2014    Procedure: RIGHT  LEG  BELOW KNEE AMPUTATION ;  Surgeon: [name], MD;  Location: [facility];  Service: Orthopedics;  Laterality: Right;  . Insertion of dialysis catheter N/A 09/03/2014    Procedure: INSERTION OF DIALYSIS CATHETER RIGHT INTERNAL JUGULAR VEIN;  Surgeon: Conrad Emlenton, MD;  Location: [facility];  Service: Vascular;  Laterality: N/A;  . Av fistula placement Left 09/06/2014    Procedure: ARTERIOVENOUS (AV) FISTULA CREATION-Left Brachiocephalic;  Surgeon: Conrad Rose Farm, MD;  Location: [facility];  Service: Vascular;  Laterality: Left;  . Peripheral vascular catheterization N/A 08/13/2015    Procedure: Abdominal Aortogram;  Surgeon: Elam Dutch, MD;  Location: [facility];  Service: Cardiovascular;  Laterality: N/A;  . Amputation Left 11/29/2015    Procedure: AMPUTATION BELOW KNEE;  Surgeon: Newt Minion, MD;  Location: [facility];  Service: Orthopedics;  Laterality: Left;    Family History  Problem Relation Age of Onset  . Cancer Mother     Pancreatic    Social History:  Lives alone and independent PTA. Used to work in Estate manager/land agent at [employer]. Has prosthesis RLE and  was ambulating short distances with RW. Family in town checks in on patient. She reports that she has never smoked. She has never used smokeless tobacco. She reports that she does not drink alcohol or use illicit drugs.    Allergies  Allergen Reactions  . Oxycodone Itching    Medications Prior to Admission  Medication Sig Dispense Refill  . acetaminophen (TYLENOL) 325 MG tablet Take 2 tablets (650 mg  total) by mouth every 6 (six) hours as needed for mild pain, moderate pain or fever.    [name] amLODipine (NORVASC) 10 MG tablet TAKE 1 TABLET BY MOUTH EVERY DAY 30 tablet 5  . aspirin 81 MG EC tablet Take 81 mg by mouth daily.      . cloNIDine (CATAPRES) 0.1 MG tablet TAKE 1 TABLET BY MOUTH TWICE A DAY 60 tablet 5  . furosemide (LASIX) 40 MG tablet TAKE 2 TABLETS BY MOUTH EVERY MORNING AND TAKE 1 TABLET EVERY EVENING 90 tablet 3  . gabapentin (NEURONTIN) 100 MG capsule Take 1 capsule by mouth 2 (two) times daily.  3  . glipiZIDE (GLUCOTROL XL) 5 MG 24 hr tablet Take 1 tablet (5 mg total) by mouth daily with breakfast. 90 tablet 3  . HYDROcodone-acetaminophen (NORCO/VICODIN) 5-325 MG tablet TAKE 1 TABLET 2 TO 3 TIMES A DAY AS NEEDED FOR PAIN  0  . HYDROmorphone (DILAUDID) 2 MG tablet Take 1 tablet (2 mg total) by mouth every 6 (six) hours as needed for severe pain. 15 tablet 0  . Multiple Vitamin (MULTIVITAMIN WITH MINERALS) TABS tablet Take 1 tablet by mouth daily.    . nitroGLYCERIN (NITRODUR - DOSED IN MG/24 HR) 0.2 mg/hr patch APPLY 1 PATCH DAILY. REMOVE OLD PATCH DAILY. CHANGE PATCH LOCATION DAILY.  3  . oxyCODONE-acetaminophen (PERCOCET/ROXICET) 5-325 MG tablet Take 1 tablet by mouth every 6 (six) hours as needed for moderate pain or severe pain.    [name] RENVELA 800 MG tablet Take 1,600 mg by mouth 3 (three) times daily with meals.     . simvastatin (ZOCOR) 20 MG tablet Take 1 tablet (20 mg total) by mouth daily at 6 PM. 90 tablet 3    Home: Home Living Family/patient expects to be discharged to:: Private residence Living Arrangements: Alone Available Help at Discharge:  (none) Type of Home: Apartment Home Access: Ramped entrance Home Layout: One level Bathroom Shower/Tub: Tub/shower unit, Architectural technologist: Standard Home Equipment: Environmental consultant - 2 wheels, [employer] - quad, Wheelchair - manual   Functional History: Prior Function Level of Independence: Independent  Functional Status:    Mobility: Bed Mobility Overal bed mobility: Needs Assistance Bed Mobility: Supine to Sit Supine to sit: HOB elevated, Mod assist General bed mobility comments: cues for technique with assist to elevate trunk into sitting and bring hips to EOB with use of bedpad; use of bedrails Transfers Overall transfer level: Needs assistance Equipment used: Rolling walker (2 wheeled), 2 person hand held assist Transfers: Sit to/from Stand, Stand Pivot Transfers Sit to Stand: Mod assist, +2 physical assistance, +2 safety/equipment, Min assist Stand pivot transfers: Max assist, +2 physical assistance, +2 safety/equipment General transfer comment: mod A to power up into standing with cues for safe hand placement and technique and min A to maintain balance in standing with verbal and tactile cues for posture and weight shifting; pt with improved balance and ability to stand on R LE; max A to pivot to chair but demonstrated abiltiy to pivot R foot this session  ADL: ADL Overall ADL's : Needs assistance/impaired Eating/Feeding: Independent, Bed level Grooming: Set up, Supervision/safety, Sitting (in recliner) Upper Body Bathing: Set up, Supervision/ safety, Sitting (in recliner) Lower Body Bathing: Total assistance (in recliner) Upper Body Dressing : Maximal assistance, Sitting (in recliner) Lower Body Dressing: Total assistance, Bed level Toilet Transfer: +2 for physical assistance, Maximal assistance, Stand-pivot (bed>recliner) Toileting- Clothing Manipulation and Hygiene: Total assistance, +2 for physical assistance, Sit to/from stand General ADL Comments: Min A to don prothesis  Cognition: Cognition Overall Cognitive Status: Within Functional Limits for tasks assessed Orientation Level: Oriented X4 Cognition Arousal/Alertness: Awake/alert Behavior During Therapy: WFL for tasks assessed/performed Overall Cognitive Status: Within Functional Limits for tasks assessed    Blood pressure  136/51, pulse 77, temperature 97.7 F (36.5 C), temperature source Oral, resp. rate 18, height '4\' 11"'  (1.499 m), weight 53.4 kg (117 lb 11.6 oz), SpO2 98 %. Physical Exam  Nursing note and vitals reviewed. Constitutional: She is oriented to person, place, and time. She appears well-developed. She has a sickly appearance. Nasal cannula in place.  HENT:  Head: Normocephalic and atraumatic.  Mouth/Throat: Oropharynx is clear and moist.  Eyes: Conjunctivae and EOM are normal. Pupils are equal, round, and reactive to light.  Neck: Normal range of motion. Neck supple.  Cardiovascular: Normal rate and regular rhythm.   No murmur heard. Respiratory: Effort normal and breath sounds normal. No respiratory distress. She has no wheezes.  GI: Soft. Bowel sounds are normal. She exhibits no distension. There is no tenderness.  Musculoskeletal: She exhibits tenderness. She exhibits no edema.  VAC on place on L-BKA amp site.  R-BKA healed well--bandaid on knee (to protect from prosthesis).   Neurological: She is alert and oriented to person, place, and time.  Motor: Bilateral upper extremities: 4+/5 proximal to distal Right lower extremity: BKA, 4+/5 hip flexion next line left lower extremity: Hip flexion 4+/5 (pain inhibition)  Skin: Skin is warm and dry.  VAC on left BKA, suctioning without leaks.  Psychiatric: She has a normal mood and affect. Her behavior is normal. Thought content normal.    Results for orders placed or performed during the hospital encounter of 11/29/15 (from the past 48 hour(s))  Glucose, capillary     Status: Abnormal   Collection Time: 12/02/15 11:40 AM  Result Value Ref Range   Glucose-Capillary 58 (L) 65 - 99 mg/dL  Glucose, capillary     Status: None   Collection Time: 12/02/15  1:30 PM  Result Value Ref Range   Glucose-Capillary 83 65 - 99 mg/dL  Glucose, capillary     Status: None   Collection Time: 12/02/15  3:12 PM  Result Value Ref Range   Glucose-Capillary 81 65  - 99 mg/dL  Glucose, capillary     Status: None   Collection Time: 12/02/15  3:51 PM  Result Value Ref Range   Glucose-Capillary 80 65 - 99 mg/dL  Glucose, capillary     Status: Abnormal   Collection Time: 12/02/15  6:37 PM  Result Value Ref Range   Glucose-Capillary 49 (L) 65 - 99 mg/dL  Glucose, capillary     Status: None   Collection Time: 12/02/15  6:56 PM  Result Value Ref Range   Glucose-Capillary 97 65 - 99 mg/dL  Glucose, capillary     Status: None   Collection Time: 12/02/15  7:29 PM  Result Value Ref Range   Glucose-Capillary 91 65 - 99 mg/dL  Glucose, capillary     Status: None  Collection Time: 12/02/15 10:10 PM  Result Value Ref Range   Glucose-Capillary 75 65 - 99 mg/dL  Glucose, capillary     Status: Abnormal   Collection Time: 12/03/15 12:04 AM  Result Value Ref Range   Glucose-Capillary 107 (H) 65 - 99 mg/dL  Glucose, capillary     Status: Abnormal   Collection Time: 12/03/15  2:23 AM  Result Value Ref Range   Glucose-Capillary 56 (L) 65 - 99 mg/dL  Glucose, capillary     Status: None   Collection Time: 12/03/15  4:16 AM  Result Value Ref Range   Glucose-Capillary 67 65 - 99 mg/dL  Glucose, capillary     Status: Abnormal   Collection Time: 12/03/15  6:41 AM  Result Value Ref Range   Glucose-Capillary 32 (LL) 65 - 99 mg/dL  Glucose, capillary     Status: Abnormal   Collection Time: 12/03/15  7:00 AM  Result Value Ref Range   Glucose-Capillary 206 (H) 65 - 99 mg/dL  Glucose, capillary     Status: Abnormal   Collection Time: 12/03/15  8:14 AM  Result Value Ref Range   Glucose-Capillary 182 (H) 65 - 99 mg/dL  Glucose, capillary     Status: Abnormal   Collection Time: 12/03/15 10:05 AM  Result Value Ref Range   Glucose-Capillary 118 (H) 65 - 99 mg/dL  Glucose, capillary     Status: Abnormal   Collection Time: 12/03/15 12:10 PM  Result Value Ref Range   Glucose-Capillary 111 (H) 65 - 99 mg/dL   Comment 1 Notify RN   Glucose, capillary     Status:  Abnormal   Collection Time: 12/03/15  2:26 PM  Result Value Ref Range   Glucose-Capillary 101 (H) 65 - 99 mg/dL  Glucose, capillary     Status: Abnormal   Collection Time: 12/03/15  4:31 PM  Result Value Ref Range   Glucose-Capillary 103 (H) 65 - 99 mg/dL  Glucose, capillary     Status: None   Collection Time: 12/03/15  6:24 PM  Result Value Ref Range   Glucose-Capillary 94 65 - 99 mg/dL  Glucose, capillary     Status: Abnormal   Collection Time: 12/03/15 10:13 PM  Result Value Ref Range   Glucose-Capillary 126 (H) 65 - 99 mg/dL  Glucose, capillary     Status: Abnormal   Collection Time: 12/04/15 12:47 AM  Result Value Ref Range   Glucose-Capillary 110 (H) 65 - 99 mg/dL  Glucose, capillary     Status: Abnormal   Collection Time: 12/04/15  2:17 AM  Result Value Ref Range   Glucose-Capillary 105 (H) 65 - 99 mg/dL  Glucose, capillary     Status: None   Collection Time: 12/04/15  5:28 AM  Result Value Ref Range   Glucose-Capillary 98 65 - 99 mg/dL  Glucose, capillary     Status: None   Collection Time: 12/04/15  6:43 AM  Result Value Ref Range   Glucose-Capillary 98 65 - 99 mg/dL  CBC     Status: Abnormal   Collection Time: 12/04/15  8:29 AM  Result Value Ref Range   WBC 8.1 4.0 - 10.5 K/uL   RBC 1.93 (L) 3.87 - 5.11 MIL/uL   Hemoglobin 5.9 (LL) 12.0 - 15.0 g/dL    Comment: REPEATED TO VERIFY CRITICAL RESULT CALLED TO, READ BACK BY AND VERIFIED WITH: Z.CALWIDAN,RN 12/04/15 '@0901'  BY V.WILKINS    HCT 17.7 (L) 36.0 - 46.0 %   MCV 91.7 78.0 - 100.0 fL  MCH 30.6 26.0 - 34.0 pg   MCHC 33.3 30.0 - 36.0 g/dL   RDW 15.4 11.5 - 15.5 %   Platelets 359 150 - 400 K/uL  Renal function panel     Status: Abnormal   Collection Time: 12/04/15  8:29 AM  Result Value Ref Range   Sodium 123 (L) 135 - 145 mmol/L   Potassium 5.2 (H) 3.5 - 5.1 mmol/L   Chloride 85 (L) 101 - 111 mmol/L   CO2 23 22 - 32 mmol/L   Glucose, Bld 111 (H) 65 - 99 mg/dL   BUN 41 (H) 6 - 20 mg/dL   Creatinine,  Ser 7.16 (H) 0.44 - 1.00 mg/dL   Calcium 8.0 (L) 8.9 - 10.3 mg/dL   Phosphorus 6.6 (H) 2.5 - 4.6 mg/dL   Albumin 2.1 (L) 3.5 - 5.0 g/dL   GFR calc non Af Amer 5 (L) >60 mL/min   GFR calc Af Amer 6 (L) >60 mL/min    Comment: (NOTE) The eGFR has been calculated using the CKD EPI equation. This calculation has not been validated in all clinical situations. eGFR's persistently <60 mL/min signify possible Chronic Kidney Disease.    Anion gap 15 5 - 15  Type and screen [place]     Status: None (Preliminary result)   Collection Time: 12/04/15  9:10 AM  Result Value Ref Range   ABO/RH(D) O POS    Antibody Screen NEG    Sample Expiration 12/07/2015    Unit Number [id number]    Blood Component Type RBC LR PHER2    Unit division 00    Status of Unit ISSUED    Transfusion Status OK TO TRANSFUSE    Crossmatch Result Compatible    Unit Number [id number]    Blood Component Type RED CELLS,LR    Unit division 00    Status of Unit ISSUED    Transfusion Status OK TO TRANSFUSE    Crossmatch Result Compatible   Prepare RBC     Status: None   Collection Time: 12/04/15  9:20 AM  Result Value Ref Range   Order Confirmation ORDER PROCESSED BY BLOOD BANK    Ct Head Wo Contrast  12/02/2015  CLINICAL DATA:  Initial evaluation for acute altered mental status. EXAM: CT HEAD WITHOUT CONTRAST TECHNIQUE: Contiguous axial images were obtained from the base of the skull through the vertex without intravenous contrast. COMPARISON:  None available. FINDINGS: Generalized cerebral atrophy with mild chronic small vessel ischemic disease present. Prominent vascular calcifications within the carotid siphons. No acute large vessel territory infarct. No intracranial hemorrhage. No mass lesion, midline shift, or mass effect. No hydrocephalus. No extra-axial fluid collection. Scalp soft tissues demonstrate no acute abnormality. No acute abnormality about the orbits. Visualized paranasal sinuses and  mastoid air cells are clear. Calvarium intact. IMPRESSION: 1. No acute intracranial process. 2. Mild age-related cerebral atrophy with chronic small vessel ischemic disease. Prominent intracranial atherosclerosis. Electronically Signed   By: Jeannine Boga M.D.   On: 12/02/2015 21:33   Dg Chest Port 1 View  12/03/2015  CLINICAL DATA:  Hypoxemia EXAM: PORTABLE CHEST 1 VIEW COMPARISON:  11/29/2015 FINDINGS: Normal heart size. Lungs clear. No pneumothorax. No pleural effusion. IMPRESSION: No active disease. Electronically Signed   By: Marybelle Killings M.D.   On: 12/03/2015 11:58     Medical Problem List and Plan: 1.  Abnormality of gait, safety, deficits with transfers secondary to left BKA, with previous right BKA. 2.  DVT Prophylaxis/Anticoagulation: Pharmaceutical:  Lovenox 3. Pain Management:  Hydrocodone prn for pain. Will add low dose robaxin to help with muscle spasms.  4. Mood: Very motivated to get better and resume independent living. LCSW to follow for evaluation and support.  5. Neuropsych: This patient is capable of making decisions on her own behalf. 6. Skin/Wound Care: Routine pressure relief measures. Nephro to help promote wound healing.  7. Fluids/Electrolytes/Nutrition: Labs being monitored ---hyponateremia/hyperkalemia being managed by HD.   8. ESRD:  On renal diet with 1200 FR. Hemodialysis after therapy sessions on MWF to help with tolerance of therapy.  9. DMT2: Monitor BS ac/hs. Continue to hold glipizide 5 mg due to low BS and poor intake.  Reports feels light headed and weak if BS < 150. Will remove diabetic retrictions to help with food choices/intake. Use SSI for elevated BS.  10 HTN: Monitor BID. Has been low due to anemia and deconditioning. Monitor for now.  11. Anemia of chronic disease: Started on Aransep weekly.  12. Metabolic bone disease:  On renvela.  13. Hypoxia: Encourage IS. Should improve with transfusion.    Post Admission Physician  Evaluation: 1. Functional deficits secondary to Left transtibial amputation, with previous right BKA. 2. Patient is admitted to receive collaborative, interdisciplinary care between the physiatrist, rehab nursing staff, and therapy team. 3. Patient's level of medical complexity and substantial therapy needs in context of that medical necessity cannot be provided at a lesser intensity of care such as a SNF. 4. Patient has experienced substantial functional loss from his/her baseline which was documented above under the "Functional History" and "Functional Status" headings.  Judging by the patient's diagnosis, physical exam, and functional history, the patient has potential for functional progress which will result in measurable gains while on inpatient rehab.  These gains will be of substantial and practical use upon discharge  in facilitating mobility and self-care at the household level. 5. Physiatrist will provide 24 hour management of medical needs as well as oversight of the therapy plan/treatment and provide guidance as appropriate regarding the interaction of the two. 6. 24 hour rehab nursing will assist with safety, skin/wound care, disease management, pain management and patient education and help integrate therapy concepts, techniques,education, etc. 7. PT will assess and treat for/with: Lower extremity strength, range of motion, stamina, balance, functional mobility, safety, adaptive techniques and equipment, woundcare, coping skills, pain control, education.   Goals are: Mod I at wheelchair. 8. OT will assess and treat for/with: ADL's, functional mobility, safety, upper extremity strength, adaptive techniques and equipment, wound mgt, ego support, and community reintegration.   Goals are: Mod I at wheelchair. Therapy may not proceed with showering this patient.. 9. Case Management and Social Worker will assess and treat for psychological issues and discharge planning. 10. Team conference will  be held weekly to assess progress toward goals and to determine barriers to discharge. 11. Patient will receive at least 3 hours of therapy per day at least 5 days per week. 12. ELOS: 13-18 days.       13. Prognosis:  good   Delice Lesch, MD  12/04/2015

## 2015-12-04 NOTE — Progress Notes (Signed)
I have received insurance approval to admit pt. To CIR.  Dr, Sharol Given has discharge to rehabilitation facility orders in chart.  Pt. Is in agreement.  I have updated Beth, RN as well as Gildardo Griffes, RNCM and Lubertha Sayres, CSW via text messaging.  I will arrange for admission later today.  Please call if questions.  [facility] Admissions Coordinator Cell [phone] Office [phone]

## 2015-12-04 NOTE — Progress Notes (Signed)
Patient ID: Carolyn Valenzuela, female   DOB: 1951/07/05, 65 y.o.   MRN: [id number] Patient is alert and oriented this morning. Wound VAC functioning well no drainage. Patient again reiterates that she wants to pursue inpatient rehabilitation. Paperwork has been completed for discharge. Continue wound VAC for 1 week with the portable Prevena wound VAC.

## 2015-12-04 NOTE — Interval H&P Note (Signed)
Carolyn Valenzuela was admitted today to Inpatient Rehabilitation with the diagnosis of left BKA, with previous right BKA>.  The patient's history has been reviewed, patient examined, and there is no change in status.  Patient continues to be appropriate for intensive inpatient rehabilitation.  I have reviewed the patient's chart and labs.  Questions were answered to the patient's satisfaction. The PAPE has been reviewed and assessment remains appropriate.  Ankit Lorie Phenix 12/04/2015, 8:37 PM

## 2015-12-04 NOTE — Evaluation (Signed)
Occupational Therapy Assessment and Plan  Patient Details  Name: Carolyn Valenzuela MRN: [id number] Date of Birth: May 09, 1951  OT Diagnosis: muscle weakness (generalized) Rehab Potential: Rehab Potential (ACUTE ONLY): Excellent ELOS: 10-14 days   Today's Date: 12/05/2015 OT Individual Time: 0900-1000 OT Individual Time Calculation (min): 60 min     Problem List:  Patient Active Problem List   Diagnosis Date Noted  . Unilateral complete BKA ([place]) 12/04/2015  . S/P bilateral BKA (below knee amputation) ([place])   . Abnormality of gait   . Muscle spasm   . ESRD on dialysis ([place])   . Type 2 diabetes mellitus with diabetic peripheral angiopathy and gangrene, without long-term current use of insulin ([place])   . Post-operative pain   . Benign essential HTN   . Metabolic bone disease   . Hypoxia   . Type 2 diabetes mellitus with peripheral neuropathy (HCC)   . Labile blood glucose   . End stage renal disease ([place])   . Postoperative pain   . Acute blood loss anemia   . Anemia of chronic disease   . Below knee amputation status ([place]) 11/29/2015  . Odynophagia 11/12/2015  . Chest pain, atypical 06/06/2015  . Chest pain 05/28/2015  . Ingrown nail 05/28/2015  . Acute pulmonary edema (HCC)   . FUO (fever of unknown origin)   . HCAP (healthcare-associated pneumonia)   . ESRD needing dialysis ([place])   . Type 2 diabetes mellitus with ESRD (end-stage renal disease) ([place]) 09/01/2014  . Anemia of renal disease 09/01/2014  . Venous insufficiency of left leg 08/27/2014  . Rash 07/06/2014  . S/P BKA (below knee amputation) ([place]) 03/20/2014  . 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 Critical lower limb ischemia 01/09/2014  . Right foot ulcer ([place]) 11/07/2013  . Toe pain 09/06/2013  . Pre-ulcerative corn or callous 09/06/2013  . Lower extremity pain 09/06/2013  . Left low back pain 12/08/2011  . Hip bursitis, left 12/08/2011  . Left leg pain 12/08/2011  . Abdominal pain, other  specified site 07/08/2011  . Preventative health care 12/08/2010  . Allergic rhinitis, cause unspecified 12/08/2010  . SECONDARY HYPERPARATHYROIDISM 05/21/2009  . NUMBNESS 05/21/2009  . BACK PAIN 05/02/2009  . ANEMIA-IRON DEFICIENCY 01/25/2008  . Essential hypertension 01/25/2008  . CERVICAL RADICULOPATHY, LEFT 01/25/2008  . Hyperlipidemia 03/30/2007    Past Medical History:  Past Medical History  Diagnosis Date  . DIABETES MELLITUS, UNCONTROLLED 05/21/2009  . HYPERLIPIDEMIA 03/30/2007  . ANEMIA-IRON DEFICIENCY 01/25/2008  . HYPERTENSION 01/25/2008  . SINUSITIS- ACUTE-NOS 01/25/2008  . SECONDARY HYPERPARATHYROIDISM 05/21/2009  . CERVICAL RADICULOPATHY, LEFT 01/25/2008  . BACK PAIN 05/02/2009  . NUMBNESS 05/21/2009  . Allergic rhinitis, cause unspecified 12/08/2010  . Foot ulcer ([place])     right lateral malleolus  . Critical lower limb ischemia   . RENAL INSUFFICIENCY 02/01/2008    HD pt.   Past Surgical History:  Past Surgical History  Procedure Laterality Date  . Av fistula placement Left   . Eye surgery Bilateral     cataracts removed, left eye still has some oil in it.  . Colonoscopy    . Amputation Right 03/15/2014    Procedure: RIGHT  LEG  BELOW KNEE AMPUTATION ;  Surgeon: [name], MD;  Location: [facility];  Service: Orthopedics;  Laterality: Right;  . Insertion of dialysis catheter N/A 09/03/2014    Procedure: INSERTION OF DIALYSIS CATHETER RIGHT INTERNAL JUGULAR VEIN;  Surgeon: Conrad Highmore, MD;  [facility]  OR;  Service: Vascular;  Laterality: N/A;  . Av fistula placement Left 09/06/2014    Procedure: ARTERIOVENOUS (AV) FISTULA CREATION-Left Brachiocephalic;  Surgeon: Conrad Leonia, MD;  Location: [facility];  Service: Vascular;  Laterality: Left;  . Peripheral vascular catheterization N/A 08/13/2015    Procedure: Abdominal Aortogram;  Surgeon: Elam Dutch, MD;  Location: [facility] CV LAB;  Service: Cardiovascular;  Laterality: N/A;  . Amputation Left 11/29/2015    Procedure:  AMPUTATION BELOW KNEE;  Surgeon: Newt Minion, MD;  Location: [facility];  Service: Orthopedics;  Laterality: Left;    Assessment & Plan Clinical Impression: Patient is a 65 y.o. 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 She was admitted on 11/29/15 for L-BKA by Dr. Sharol Given. Post op with sedation due to narcotics but has been showing good motivation with improvement in mentation. Noted to have significant acute on chronic anemia with drop in H/H to 5.9/17.7 and to be transfused with 2 units PRBC today. Dr. Sharol Given recommends continuing Prevana wound VAC for 1 week post surgery. Therapy ongoing and CIR recommended by therapy team to help patient achieve prior level of independence with ADL tasks and mobility.   Patient transferred to CIR on 12/04/2015 .    Patient currently requires moderate assistance with basic self-care skills secondary to muscle weakness.  Prior to hospitalization, patient could complete BADL with modified independence.  Patient will benefit from skilled intervention to increase independence with basic self-care skills prior to discharge home independently.  Anticipate patient will require intermittent supervision and follow up home health.  OT - End of Session Activity Tolerance: Tolerates 30+ min activity with multiple rests Endurance Deficit: Yes OT Assessment Rehab Potential (ACUTE ONLY): Excellent Barriers to Discharge: Decreased caregiver support OT Patient demonstrates impairments in the following area(s): Balance;Endurance;Pain OT Basic ADL's Functional Problem(s): Bathing;Dressing;Toileting OT Advanced ADL's Functional Problem(s): Simple Meal Preparation;Light Housekeeping OT Transfers Functional Problem(s): Toilet;Tub/Shower OT Plan OT Intensity: Minimum of 1-2 x/day, 45 to 90  minutes OT Frequency: 5 out of 7 days OT Duration/Estimated Length of Stay: 10-14 days OT Treatment/Interventions: Balance/vestibular training;Discharge planning;Self Care/advanced ADL retraining;Therapeutic Activities;Therapeutic Exercise;Wheelchair propulsion/positioning;Patient/family education;Functional mobility training;DME/adaptive equipment instruction;Pain management;UE/LE Strength taining/ROM OT Self Feeding Anticipated Outcome(s): Mod I OT Basic Self-Care Anticipated Outcome(s): Mod I OT Toileting Anticipated Outcome(s): Mod I OT Bathroom Transfers Anticipated Outcome(s): Supervision OT Recommendation Patient destination: Home Follow Up Recommendations: Home health OT Equipment Recommended: To be determined   Skilled Therapeutic Intervention OT initial evaluation completed with treatment provided to address transfers (toilet) and adapted bathing/dressing skills.  Pt demonstrated excellent awareness of deficits relating to transfers and toileting (clothing management and hygiene) complicated by loss of limb and presence of supplemental oxygen and wound vac.   Pt completes transfers and upper body bathing/dressing with overall mod assist , requiring setup to manage medical equipment and supplies, and extra time to rest.   Pt reports motivation to return home to independent lifestyle again, with support from her sister and niece with only heavy homemaking and transportation.   Pt anticipates possible discharge to her sister's home although her sister is now enrolled in classes and is unavailable during the daytime.  OT Evaluation Precautions/Restrictions  Precautions Precautions: Fall Precaution Comments: wound vac Required Braces or Orthoses: Other Brace/Splint Other Brace/Splint: has prosthetic for RLE Restrictions Weight Bearing Restrictions: Yes LLE  Weight Bearing: Non weight bearing Other Position/Activity Restrictions: LLE NWB  General Chart Reviewed: Yes Family/Caregiver  Present: No  Pain Pain Assessment Pain Assessment: 0-10 Pain Score: 10-Worst pain ever Pain Type: Acute pain;Phantom pain Pain Location: Leg Pain Orientation: Left Pain Descriptors / Indicators: Sharp;Aching Pain Frequency: Intermittent Pain Onset: With Activity Patients Stated Pain Goal: 4 Pain Intervention(s): Medication (See eMAR)  Home Living/Prior [place] expects to be discharged to:: Private residence Living Arrangements: Alone Available Help at Discharge: Family, Available PRN/intermittently Type of Home: Apartment Home Access: Ramped entrance Home Layout: One level Bathroom Shower/Tub: Chiropodist: Standard Bathroom Accessibility: Yes  Lives With: Alone Prior Function Level of Independence: Independent with basic ADLs, Requires assistive device for independence  Able to Take Stairs?: Yes Driving: No Vocation: Retired  ADL ADL ADL Comments: see Functional Assessment Tool  Vision/Perception  Vision- History Baseline Vision/History: Wears glasses Wears Glasses: Distance only;Reading only Patient Visual Report: No change from baseline Vision- Assessment Vision Assessment?: No apparent visual deficits   Cognition Overall Cognitive Status: Within Functional Limits for tasks assessed Arousal/Alertness: Awake/alert Orientation Level: Person;Place;Situation Person: Oriented Place: Oriented Situation: Oriented Year: 2017 Month: April Day of Week: Incorrect Memory: Appears intact Immediate Memory Recall: Sock;Blue;Bed Memory Recall: Sock;Blue;Bed Memory Recall Sock: Without Cue Memory Recall Blue: Without Cue Memory Recall Bed: With Cue Attention: Selective Selective Attention: Appears intact Awareness: Appears intact Problem Solving: Appears intact Safety/Judgment: Appears intact  Sensation Sensation Light Touch: Appears Intact Stereognosis: Appears Intact Hot/Cold: Appears Intact Proprioception:  Appears Intact Coordination Gross Motor Movements are Fluid and Coordinated: Yes Fine Motor Movements are Fluid and Coordinated: Yes Finger Nose Finger Test: [facility]  Motor  Motor Motor: Within Functional Limits  Mobility  Bed Mobility Bed Mobility: Rolling Right;Rolling Left;Supine to Sit;Sit to Supine Rolling Right: 5: Supervision Rolling Right Details: Verbal cues for sequencing;Verbal cues for technique;Verbal cues for precautions/safety Rolling Left: 5: Supervision Rolling Left Details: Verbal cues for sequencing;Verbal cues for technique;Verbal cues for precautions/safety Supine to Sit: 5: Supervision Supine to Sit Details: Verbal cues for sequencing;Verbal cues for technique;Verbal cues for precautions/safety Sit to Supine: 5: Supervision Sit to Supine - Details: Verbal cues for gait pattern;Verbal cues for safe use of DME/AE;Manual facilitation for weight shifting Transfers Sit to Stand: 3: Mod assist Sit to Stand Details: Verbal cues for sequencing;Verbal cues for technique;Verbal cues for precautions/safety;Visual cues/gestures for precautions/safety Stand to Sit: 3: Mod assist Stand to Sit Details (indicate cue type and reason): Verbal cues for sequencing;Verbal cues for technique;Verbal cues for precautions/safety   Trunk/Postural Assessment  Cervical Assessment Cervical Assessment: Within Functional Limits Thoracic Assessment Thoracic Assessment: Within Functional Limits Lumbar Assessment Lumbar Assessment: Within Functional Limits Postural Control Postural Control: Within Functional Limits   Balance Balance Balance Assessed: Yes Static Sitting Balance Static Sitting - Comment/# of Minutes: Able to maintain balance without UE support.  Dynamic Sitting Balance Sitting balance - Comments: Able to maintain balance without UE or LE Support to donn R LE prosthesis Static Standing Balance Static Standing - Comment/# of Minutes: Able to maintain standing balance with  BUE support on RW and min A  Extremity/Trunk Assessment RUE Assessment RUE Assessment: Exceptions to [facility] RUE Strength RUE Overall Strength: Deficits (4/5 gross strength) LUE Assessment LUE Assessment: Exceptions to [facility] LUE Strength LUE Overall Strength: Deficits (4/5 gross strength)   See Function Navigator for Current Functional Status.   Refer to Care Plan for Long Term Goals  Recommendations for other services: None  Discharge Criteria:  Patient will be discharged from OT if patient refuses treatment 3 consecutive times without medical reason, if treatment goals not met, if there is a change in medical status, if patient makes no progress towards goals or if patient is discharged from hospital.  The above assessment, treatment plan, treatment alternatives and goals were discussed and mutually agreed upon: by patient  [facility] 12/05/2015, 12:38 PM

## 2015-12-04 NOTE — Progress Notes (Signed)
OT Cancellation Note  Patient Details Name: Carolyn Valenzuela MRN: [id number] DOB: [birth year]   Cancelled Treatment:    Reason Eval/Treat Not Completed: Patient at procedure or test/ unavailable. Pt just transported to dialysis. Will try to see at later time as schedule allows.  Almon Register [id number] 12/04/2015, 8:30 AM

## 2015-12-04 NOTE — Progress Notes (Signed)
Pt transferred to dialysis after night RN called rept. Pt completed breakfast prior going to dialysis.

## 2015-12-04 NOTE — Progress Notes (Signed)
Pt received back from dialysis. Rept received from dialysis.

## 2015-12-04 NOTE — Progress Notes (Signed)
Subjective:  On HD, Hb low at 5.9 and Na low at 123  Objective Vital signs in last 24 hours: Filed Vitals:   12/04/15 0500 12/04/15 0825 12/04/15 0830 12/04/15 0858  BP: 116/54 117/50 107/52 108/43  Pulse: 78 73 73 71  Temp: 99 F (37.2 C) 99.8 F (37.7 C)    TempSrc: Oral Oral    Resp: 16 18    Height:      Weight:  53.4 kg (117 lb 11.6 oz)    SpO2: 95% 98%     Weight change: 0.2 kg (7.1 oz)   Physical Exam: General: Thin,chronically ill AAF NAD/ OX3 on HD Lungs: CTA unlabored. Heart: RRR No murmurs, rubs, or gallops appreciated. Abdomen: Soft, non-tender, non-distended . Lower extremities: L Transtibial Amp. Site with wound vac / R BKA . [name] Dialysis Access: LUA AVF patent on hd   4/4 CXR no active disease  Dialysis: [facility] MWF  4h  52kg preBKA  3K/2.25 bath  P4  LUA AVF  Hep 2500 Mircera: 50 mcg IV q 2 weeks (11/13/15) Hectoral: 4 mcg IV Q TTS (last dose 11/25/15)  Problems: 1. Gangrene L foot sp L BKA 3/31 - has wound vac, poss plans for rehab at [facility] 2. ESRD - [facility] MWF HD 3. HTN - BP's low on HD, dc bp meds for now 4. Volume excess - should be down around 49 kg after amp 5. Anemiaof ESRD and ABL- Hb down 5.9, esa ^'d 50 > 100 ug/ wk 6. Metabolic bone disease - phos 8.1>6.7 cont binder/ VDRA 7. Nutrition - Last in-center Albumin 3.5.>2.4 . Renal carb mod diet, renal vit and add nepro  8. DM: per primary. 9. Disposition: in hosp rehab  noted   Plan -  HD max UF, get vol down.  Transfuse 2u prbc. Stool guiac  Kelly Splinter MD [facility] pager [phone]    cell [phone] 12/04/2015, 9:16 AM    Labs: Basic Metabolic Panel:  Recent Labs Lab 11/29/15 2250 11/30/15 1156 12/02/15 0518 12/04/15 0829  NA 135 136 128* 123*  K 6.4* 4.2 4.9 5.2*  CL 97* 94* 88* 85*  CO2 20* 25 26 23   GLUCOSE 232* 118* 39* 111*  BUN 65* 15 39* 41*  CREATININE 11.69* 4.19* 7.88* 7.16*  CALCIUM 8.4* 8.8* 8.0* 8.0*  PHOS 8.1*  --  6.7* 6.6*   Liver  Function Tests:  Recent Labs Lab 11/29/15 2250 12/02/15 0518 12/04/15 0829  ALBUMIN 2.7* 2.4* 2.1*   No results for input(s): LIPASE, AMYLASE in the last 168 hours. No results for input(s): AMMONIA in the last 168 hours. CBC:  Recent Labs Lab 11/29/15 2250 12/02/15 0518 12/04/15 0829  WBC 10.2 9.9 8.1  HGB 8.4* 7.2* 5.9*  HCT 25.0* 22.5* 17.7*  MCV 92.9 92.6 91.7  PLT 354 381 359   Cardiac Enzymes: No results for input(s): CKTOTAL, CKMB, CKMBINDEX, TROPONINI in the last 168 hours. CBG:  Recent Labs Lab 12/03/15 2213 12/04/15 0047 12/04/15 0217 12/04/15 0528 12/04/15 0643  GLUCAP 126* 110* 105* 98 98    Studies/Results: Ct Head Wo Contrast  12/02/2015  CLINICAL DATA:  Initial evaluation for acute altered mental status. EXAM: CT HEAD WITHOUT CONTRAST TECHNIQUE: Contiguous axial images were obtained from the base of the skull through the vertex without intravenous contrast. COMPARISON:  None available. FINDINGS: Generalized cerebral atrophy with mild chronic small vessel ischemic disease present. Prominent vascular calcifications within the carotid siphons. No acute large vessel territory infarct. No intracranial  hemorrhage. No mass lesion, midline shift, or mass effect. No hydrocephalus. No extra-axial fluid collection. Scalp soft tissues demonstrate no acute abnormality. No acute abnormality about the orbits. Visualized paranasal sinuses and mastoid air cells are clear. Calvarium intact. IMPRESSION: 1. No acute intracranial process. 2. Mild age-related cerebral atrophy with chronic small vessel ischemic disease. Prominent intracranial atherosclerosis. Electronically Signed   By: Jeannine Boga M.D.   On: 12/02/2015 21:33   Dg Chest Port 1 View  12/03/2015  CLINICAL DATA:  Hypoxemia EXAM: PORTABLE CHEST 1 VIEW COMPARISON:  11/29/2015 FINDINGS: Normal heart size. Lungs clear. No pneumothorax. No pleural effusion. IMPRESSION: No active disease. Electronically Signed   By:  Marybelle Killings M.D.   On: 12/03/2015 11:58   Medications: . sodium chloride 10 mL/hr at 11/29/15 2230  . sodium chloride 10 mL/hr at 11/29/15 2230   . amLODipine  10 mg Oral Daily  . cloNIDine  0.1 mg Oral BID  . Darbepoetin Alfa      . darbepoetin (ARANESP) injection - DIALYSIS  100 mcg Intravenous Q Wed-HD  . docusate sodium  100 mg Oral BID  . feeding supplement (NEPRO CARB STEADY)  237 mL Oral TID WC  . gabapentin  100 mg Oral BID  . glipiZIDE  5 mg Oral Q breakfast  . insulin aspart  0-9 Units Subcutaneous TID WC  . sevelamer carbonate  1,600 mg Oral TID WC  . simvastatin  20 mg Oral q1800

## 2015-12-04 NOTE — Progress Notes (Signed)
Ankit Lorie Phenix, MD Physician Signed Physical Medicine and Rehabilitation Consult Note 12/02/2015 6:22 AM  Related encounter: Admission (Current) from 11/29/2015 in [place] Collapse All        Physical Medicine and Rehabilitation Consult Reason for Consult: Left transtibial amputation Referring Physician: Dr. Sharol Given  HPI: Carolyn Valenzuela is a 65 y.o. right handed female with history of hypertension, diabetes mellitus and peripheral neuropathy, end-stage renal disease with hemodialysis, right BKA July 2015 and received inpatient rehabilitation services and discharged to home ambulating short distances with a rolling walker. Patient does have a prosthesis for right lower extremity provided by [employer]. Patient lives alone one level apartment with ramped entrance. Siblings in the area check as needed. Presented 11/29/2015 with dry gangrenous left foot. No change with conservative care. Underwent left transtibial amputation 11/29/2015 per Dr. Sharol Given. Hospital course pain management. Hemodialysis ongoing as per renal services. Acute on chronic anemia hemoglobin 8.4. Physical therapy evaluation completed with recommendations of physical medicine rehabilitation consult.  Review of Systems  Constitutional: Negative for fever and chills.  HENT: Negative for hearing loss.  Eyes: Negative for blurred vision and double vision.  Respiratory: Negative for cough.   Shortness of breath with exertion  Cardiovascular: Positive for leg swelling. Negative for chest pain and palpitations.  Gastrointestinal: Positive for nausea and constipation. Negative for vomiting.  Musculoskeletal: Positive for myalgias and back pain.  Skin: Negative for rash.  Neurological: Positive for weakness. Negative for seizures and headaches.  All other systems reviewed and are negative.  Past Medical History  Diagnosis Date  . DIABETES MELLITUS,  UNCONTROLLED 05/21/2009  . HYPERLIPIDEMIA 03/30/2007  . ANEMIA-IRON DEFICIENCY 01/25/2008  . HYPERTENSION 01/25/2008  . SINUSITIS- ACUTE-NOS 01/25/2008  . SECONDARY HYPERPARATHYROIDISM 05/21/2009  . CERVICAL RADICULOPATHY, LEFT 01/25/2008  . BACK PAIN 05/02/2009  . NUMBNESS 05/21/2009  . Allergic rhinitis, cause unspecified 12/08/2010  . Foot ulcer ([employer])     right lateral malleolus  . Critical lower limb ischemia   . RENAL INSUFFICIENCY 02/01/2008    HD pt.   Past Surgical History  Procedure Laterality Date  . Av fistula placement Left   . Eye surgery Bilateral     cataracts removed, left eye still has some oil in it.  . Colonoscopy    . Amputation Right 03/15/2014    Procedure: RIGHT LEG BELOW KNEE AMPUTATION ; Surgeon: [name], MD; Location: [place]; Service: Orthopedics; Laterality: Right;  . Insertion of dialysis catheter N/A 09/03/2014    Procedure: INSERTION OF DIALYSIS CATHETER RIGHT INTERNAL JUGULAR VEIN; Surgeon: Conrad Vincennes, MD; Location: [place]; Service: Vascular; Laterality: N/A;  . Av fistula placement Left 09/06/2014    Procedure: ARTERIOVENOUS (AV) FISTULA CREATION-Left Brachiocephalic; Surgeon: Conrad Jamesport, MD; Location: [place]; Service: Vascular; Laterality: Left;  . Peripheral vascular catheterization N/A 08/13/2015    Procedure: Abdominal Aortogram; Surgeon: Elam Dutch, MD; Location: [facility]; Service: Cardiovascular; Laterality: N/A;   Family History  Problem Relation Age of Onset  . Cancer Mother     Pancreatic   Social History:  reports that she has never smoked. She has never used smokeless tobacco. She reports that she does not drink alcohol or use illicit drugs. Allergies:  Allergies  Allergen Reactions  . Oxycodone Itching   Medications Prior to Admission  Medication Sig Dispense Refill  . acetaminophen (TYLENOL) 325  MG tablet Take 2 tablets (650 mg total) by mouth every 6 (  six) hours as needed for mild pain, moderate pain or fever.    [name] amLODipine (NORVASC) 10 MG tablet TAKE 1 TABLET BY MOUTH EVERY DAY 30 tablet 5  . aspirin 81 MG EC tablet Take 81 mg by mouth daily.     . cloNIDine (CATAPRES) 0.1 MG tablet TAKE 1 TABLET BY MOUTH TWICE A DAY 60 tablet 5  . furosemide (LASIX) 40 MG tablet TAKE 2 TABLETS BY MOUTH EVERY MORNING AND TAKE 1 TABLET EVERY EVENING 90 tablet 3  . gabapentin (NEURONTIN) 100 MG capsule Take 1 capsule by mouth 2 (two) times daily.  3  . glipiZIDE (GLUCOTROL XL) 5 MG 24 hr tablet Take 1 tablet (5 mg total) by mouth daily with breakfast. 90 tablet 3  . HYDROcodone-acetaminophen (NORCO/VICODIN) 5-325 MG tablet TAKE 1 TABLET 2 TO 3 TIMES A DAY AS NEEDED FOR PAIN  0  . HYDROmorphone (DILAUDID) 2 MG tablet Take 1 tablet (2 mg total) by mouth every 6 (six) hours as needed for severe pain. 15 tablet 0  . Multiple Vitamin (MULTIVITAMIN WITH MINERALS) TABS tablet Take 1 tablet by mouth daily.    . nitroGLYCERIN (NITRODUR - DOSED IN MG/24 HR) 0.2 mg/hr patch APPLY 1 PATCH DAILY. REMOVE OLD PATCH DAILY. CHANGE PATCH LOCATION DAILY.  3  . oxyCODONE-acetaminophen (PERCOCET/ROXICET) 5-325 MG tablet Take 1 tablet by mouth every 6 (six) hours as needed for moderate pain or severe pain.    [name] RENVELA 800 MG tablet Take 1,600 mg by mouth 3 (three) times daily with meals.     . simvastatin (ZOCOR) 20 MG tablet Take 1 tablet (20 mg total) by mouth daily at 6 PM. 90 tablet 3    Home: Home Living Family/patient expects to be discharged to:: Private residence Living Arrangements: Alone Available Help at Discharge: Family, Available PRN/intermittently Type of Home: Apartment Home Access: Ramped entrance Home Layout: One level Home Equipment: Environmental consultant - 2 wheels, [employer] - quad, Wheelchair - manual  Functional History: Prior Function Level  of Independence: Independent Functional Status:  Mobility: Bed Mobility Overal bed mobility: Modified Independent General bed mobility comments: able to get to EOB with use of rail and verbal cueing; able to return to sitting in same manner. Did not transfer as patient very groggy due to pain meds and patient's RLE prosthetic not present.         ADL:    Cognition: Cognition Overall Cognitive Status: Within Functional Limits for tasks assessed Orientation Level: Oriented X4 Cognition Arousal/Alertness: Awake/alert Behavior During Therapy: WFL for tasks assessed/performed Overall Cognitive Status: Within Functional Limits for tasks assessed  Blood pressure 124/47, pulse 95, temperature 100 F (37.8 C), temperature source Oral, resp. rate 16, height 4\' 11"  (1.499 m), weight 50.5 kg (111 lb 5.3 oz), SpO2 93 %. Physical Exam  Vitals reviewed. Constitutional: She is oriented to person, place, and time. She appears well-developed and well-nourished.  HENT:  Head: Normocephalic and atraumatic.  Eyes: Conjunctivae and EOM are normal.  Neck: Normal range of motion. Neck supple. No thyromegaly present.  Cardiovascular: Normal rate and regular rhythm.  Left upper extremity fistula  Respiratory: Effort normal and breath sounds normal. No respiratory distress.  GI: Soft. Bowel sounds are normal. She exhibits no distension.  Musculoskeletal: She exhibits tenderness. She exhibits no edema.  Bilateral lower extremity BKA  Neurological: She is alert and oriented to person, place, and time.  Motor: Bilateral upper extremities: 4/5 proximal to distal Right lower extremity: BKA, 3/5 hip flexion next line left  lower extremity: Hip flexion 3 -/5 (pain inhibition)  Skin:  Left BKA dressed with VAC and appropriately tender. Right BKA well-healed  Psychiatric: She has a normal mood and affect. Her behavior is normal.     Lab Results Last 24 Hours    Results for orders placed or performed  during the hospital encounter of 11/29/15 (from the past 24 hour(s))  Glucose, capillary Status: None   Collection Time: 12/01/15 12:05 PM  Result Value Ref Range   Glucose-Capillary 84 65 - 99 mg/dL  Glucose, capillary Status: Abnormal   Collection Time: 12/01/15 4:29 PM  Result Value Ref Range   Glucose-Capillary 48 (L) 65 - 99 mg/dL  Glucose, capillary Status: Abnormal   Collection Time: 12/01/15 5:01 PM  Result Value Ref Range   Glucose-Capillary 46 (L) 65 - 99 mg/dL  Glucose, capillary Status: None   Collection Time: 12/01/15 5:49 PM  Result Value Ref Range   Glucose-Capillary 93 65 - 99 mg/dL  Glucose, capillary Status: None   Collection Time: 12/01/15 9:21 PM  Result Value Ref Range   Glucose-Capillary 70 65 - 99 mg/dL      Imaging Results (Last 48 hours)    No results found.    Assessment/Plan: Diagnosis: Left transtibial amputation Labs and images independently reviewed. Records reviewed and summated above. Wheelchair training Clean amputation daily with soap and water after removal of wound Monitor incision site for signs of infection or impending skin breakdown. Staples to remain in place for 3-4 weeks Stump shrinker, for edema control after removal of wound VAC Scar mobilization massaging to prevent soft tissue adherence Stump protector during therapies Prevent flexion contractures by implementing the following:  Encourage prone lying for 20-30 mins per day BID to avoid hip flexion Contractures if medically appropriate; Avoid pillow under knees when patient is lying in bed in order to prevent both knee and hip flexion contractures; Avoid prolonged sitting Post surgical pain control with oral medication Phantom limb pain control with physical modalities including desensitization techniques (gentle self massage to the residual  stump,hot packs if sensation iintact, [place]) and mirror therapy, TENS. If ineffective, consider pharmacological treatment for neuropathic pain (e.g gabapentin, pregabalin, amytriptalyine, duloxetine).  When using wheelchair, patient should have knee on amputated side fully extended with board under the seat cushion.  1. Does the need for close, 24 hr/day medical supervision in concert with the patient's rehab needs make it unreasonable for this patient to be served in a less intensive setting? Yes  2. Co-Morbidities requiring supervision/potential complications: HTN (monitor and provide prns in accordance with increased physical exertion and pain), diabetes mellitus and peripheral neuropathy, currently labile (Monitor in accordance with exercise and adjust meds as necessary), end-stage renal disease with hemodialysis (continue recs per nephro), previous right BKA (continue prosthesis), post-op pain (Biofeedback training with therapies to help reduce reliance on opiate pain medications, monitor pain control during therapies, and sedation at rest and titrate to maximum efficacy to ensure participation and gains in therapies), acute on chronic anemia (transfuse if necessary to ensure appropriate perfusion for increased activity tolerance) 3. Due to bladder management, safety, skin/wound care, disease management, pain management and patient education, does the patient require 24 hr/day rehab nursing? Yes 4. Does the patient require coordinated care of a physician, rehab nurse, PT (1-2 hrs/day, 5 days/week) and OT (1-2 hrs/day, 5 days/week) to address physical and functional deficits in the context of the above medical diagnosis(es)? Yes Addressing deficits in the following areas: balance, endurance, locomotion, strength, transferring,  bathing, dressing, toileting and psychosocial support 5. Can the patient actively participate in an intensive therapy program of at least 3 hrs of therapy per day at least 5 days  per week? Potentially 6. The potential for patient to make measurable gains while on inpatient rehab is good 7. Anticipated functional outcomes upon discharge from inpatient rehab are TBD with PT, TBD with OT, n/a with SLP. 8. Estimated rehab length of stay to reach the above functional goals is: TBD 9. Does the patient have adequate social supports and living environment to accommodate these discharge functional goals? Potentially 10. Anticipated D/C setting: Home 11. Anticipated post D/C treatments: HH therapy and Home excercise program 12. Overall Rehab/Functional Prognosis: good  RECOMMENDATIONS: This patient's condition is appropriate for continued rehabilitative care in the following setting: Will need a more thorough PT evaluation prior to determining appropriate discharge destination. Will also need patient to be medically stable. Given patient's previous amputation and current weakness, if it does not appear that she will be able to achieve an independent level of functioning, she may need to go to SNF., no fevers Patient has agreed to participate in recommended program. Potentially Note that insurance prior authorization may be required for reimbursement for recommended care.  Comment: Rehab Admissions Coordinator to follow up.  Delice Lesch, MD 12/02/2015       Revision History     Date/Time User Provider Type Action   12/02/2015 12:22 PM Ankit Lorie Phenix, MD Physician Sign   12/02/2015 7:15 AM Cathlyn Parsons, PA-C Physician Assistant Pend   View Details Report       Routing History     Date/Time From To Method   12/02/2015 12:22 PM Ankit Lorie Phenix, MD Biagio Borg, MD In [facility]

## 2015-12-04 NOTE — Progress Notes (Signed)
CRITICAL VALUE ALERT  Critical value received: hgb 5.9  Date of notification:  12/04/2015  Time of notification:  0900   Nurse who received alert: Z Renn Stille  MD notified (1st page):  [place]  Time of first page:  0900  MD notified (2nd page):  Time of second page:  Responding MD:  Jonnie Finner  Time MD responded:  0900

## 2015-12-04 NOTE — Progress Notes (Signed)
Pt transferred with belongings via hospital bed to [phone] without incident per MD order.

## 2015-12-04 NOTE — Progress Notes (Signed)
Gerlean Ren Rehab Admission Coordinator Signed Physical Medicine and Rehabilitation PMR Pre-admission 12/04/2015 3:55 PM  Related encounter: Admission (Current) from 11/29/2015 in [facility] Collapse All   PMR Admission Coordinator Pre-Admission Assessment  Patient: Carolyn Valenzuela is an 65 y.o., female MRN: [id number] DOB: February 11, 1951 Height: _0  (149.9 cm) Weight: 51 kg (112 lb 7 oz)  Insurance Information HMO: PPO: PCP: IPA: 80/20: OTHER:  PRIMARY: BCBS State Policy#: [id number] Subscriber: self CM Name: Philis Fendt Phone#: [phone] Fax#: [phone] Pre-Cert#: [id number], authorized for 12/04/15-12/17/15 with clinical updates due 12/17/15  Employer: retired Benefits: Phone #: [phone] Name: Blue E Eff. Date: 09/01/15 Deduct: $1250 Out of Pocket Max: $4350 Life Max: n/a CIR: $450 copay then 20% after deductible met SNF: 80/20%; per Philis Fendt, BCBS will not cover SNF following this IP rehab admission Outpatient: 80% Co-Pay: 20% Home Health: 80% Co-Pay: 20% DME: 80% Co-Pay: 20% Providers: In network SECONDARY: Policy#: Subscriber:  CM Name: Phone#: Fax#:  Pre-Cert#: Employer:  Benefits: Phone #: Name:  Eff. Date: Deduct: Out of Pocket Max: Life Max:  CIR: SNF:  Outpatient: Co-Pay:  Home Health: Co-Pay:  DME: Co-Pay:   Medicaid Application Date: Case Manager:  Disability Application Date: Case Worker:   Emergency Contact Information Contact Information    Name Relation Home Work Mobile   [place] Sister   [phone]   [name] Sister  [phone]     Carolyn Valenzuela, Carolyn Valenzuela   [phone]     Current Medical History  Patient Admitting Diagnosis: Left transtibial amputation History of Present Illness: Carolyn Valenzuela is a 65 y.o. 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 She was admitted on 11/29/15 for L-BKA by Dr. Sharol Given. Post op with sedation due to narcotics but has been showing good motivation with improvement in mentation. Noted to have significant acute on chronic anemia with drop in H/H to 5.9/17.7 and to be transfused with 2 units PRBC today. Dr. Sharol Given recommends continuing Prevana wound VAC for 1 week post surgery. Therapy ongoing and CIR recommended by therapy team to help patient achieve prior level of independence with ADL tasks and mobility.       Past Medical History  Past Medical History  Diagnosis Date  . DIABETES MELLITUS, UNCONTROLLED 05/21/2009  . HYPERLIPIDEMIA 03/30/2007  . ANEMIA-IRON DEFICIENCY 01/25/2008  . HYPERTENSION 01/25/2008  . SINUSITIS- ACUTE-NOS 01/25/2008  . SECONDARY HYPERPARATHYROIDISM 05/21/2009  . CERVICAL RADICULOPATHY, LEFT 01/25/2008  . BACK PAIN 05/02/2009  . NUMBNESS 05/21/2009  . Allergic rhinitis, cause unspecified 12/08/2010  . Foot ulcer ([place])     right lateral malleolus  . Critical lower limb ischemia   . RENAL INSUFFICIENCY 02/01/2008    HD pt.    Family History  family history includes Cancer in her mother.  Prior Rehab/Hospitalizations:  Has the patient had major surgery during 100 days prior to admission? No  Current Medications   Current facility-administered medications:  . 0.9 % sodium chloride infusion, , Intravenous, Continuous, Meridee Score V, MD, Last Rate: 10 mL/hr at 11/29/15 2230 . 0.9 % sodium chloride  infusion, , Intravenous, Continuous, Meridee Score V, MD, Last Rate: 10 mL/hr at 11/29/15 2230 . 0.9 % sodium chloride infusion, , Intravenous, Once, Roney Jaffe, MD . 0.9 % sodium chloride infusion, , Intravenous, Once, Roney Jaffe, MD .  acetaminophen (TYLENOL) 325 MG tablet, , , ,  . acetaminophen (TYLENOL) tablet 650 mg, 650 mg, Oral, Q6H PRN, 650 mg at 12/04/15 1124 **OR** acetaminophen (TYLENOL) suppository 650 mg, 650 mg, Rectal, Q6H PRN, Newt Minion, MD . bisacodyl (DULCOLAX) suppository 10 mg, 10 mg, Rectal, Daily PRN, Newt Minion, MD . Darbepoetin Alfa (ARANESP) 100 MCG/0.5ML injection, , , ,  . Darbepoetin Alfa (ARANESP) injection 100 mcg, 100 mcg, Intravenous, Q Wed-HD, Ernest Haber, PA-C, 100 mcg at 12/04/15 1143 . docusate sodium (COLACE) capsule 100 mg, 100 mg, Oral, BID, Meridee Score V, MD, 100 mg at 12/04/15 1320 . feeding supplement (NEPRO CARB STEADY) liquid 237 mL, 237 mL, Oral, TID WC, David Zeyfang, PA-C, 237 mL at 12/04/15 1200 . gabapentin (NEURONTIN) capsule 100 mg, 100 mg, Oral, BID, Meridee Score V, MD, 100 mg at 12/04/15 1321 . glipiZIDE (GLUCOTROL XL) 24 hr tablet 5 mg, 5 mg, Oral, Q breakfast, Newt Minion, MD, Stopped at 12/02/15 [phone] . HYDROcodone-acetaminophen (NORCO) 10-325 MG per tablet 1-2 tablet, 1-2 tablet, Oral, Q4H PRN, Newt Minion, MD, 1 tablet at 12/04/15 0724 . HYDROmorphone (DILAUDID) injection 1 mg, 1 mg, Intravenous, Q2H PRN, Meridee Score V, MD, 1 mg at 11/30/15 0415 . insulin aspart (novoLOG) injection 0-9 Units, 0-9 Units, Subcutaneous, TID WC, Newt Minion, MD, 1 Units at 11/30/15 1752 . [DISCONTINUED] metoCLOPramide (REGLAN) tablet 5-10 mg, 5-10 mg, Oral, Q8H PRN **OR** metoCLOPramide (REGLAN) injection 5-10 mg, 5-10 mg, Intravenous, Q8H PRN, Meridee Score V, MD . metoCLOPramide (REGLAN) tablet 5-10 mg, 5-10 mg, Oral, Q8H PRN **OR** [DISCONTINUED] metoCLOPramide (REGLAN) injection 5-10 mg, 5-10 mg, Intravenous, Q8H PRN,  Meridee Score V, MD . ondansetron (ZOFRAN) tablet 4 mg, 4 mg, Oral, Q6H PRN **OR** ondansetron (ZOFRAN) injection 4 mg, 4 mg, Intravenous, Q6H PRN, Meridee Score V, MD . polyethylene glycol (MIRALAX / GLYCOLAX) packet 17 g, 17 g, Oral, Daily PRN, Meridee Score V, MD, 17 g at 12/04/15 1321 . sevelamer carbonate (RENVELA) tablet 1,600 mg, 1,600 mg, Oral, TID WC, Meridee Score V, MD, 1,600 mg at 12/04/15 1321 . simvastatin (ZOCOR) tablet 20 mg, 20 mg, Oral, q1800, Newt Minion, MD, 20 mg at 12/04/15 0050  Patients Current Diet: Diet renal/carb modified with fluid restriction Diet-HS Snack?: Nothing; Room service appropriate?: Yes; Fluid consistency:: Thin Diet - low sodium heart healthy  Precautions / Restrictions Precautions Precautions: Fall Precaution Comments: wound vac Other Brace/Splint: has prosthetic for RLE Restrictions Weight Bearing Restrictions: Yes LLE Weight Bearing: Non weight bearing   Has the patient had 2 or more falls or a fall with injury in the past year?No  Prior Activity Level Community (5-7x/wk): Pt. states she goes out 3x/week via Teaching laboratory technician. Pt. also goes to church Sundays, out to the grocery store and shopping center a couple times per week.   Home Assistive Devices / Equipment Home Assistive Devices/Equipment: Wheelchair, Environmental consultant (specify type), Kasandra Knudsen (specify quad or straight) Home Equipment: Walker - 2 wheels, Cane - quad, Wheelchair - manual  Prior Device Use: Indicate devices/aids used by the patient prior to current illness, exacerbation or injury? None of the above  Prior Functional Level Prior Function Level of Independence: Independent  Self Care: Did the patient need help bathing, dressing, using the toilet or eating? Independent  Indoor Mobility: Did the patient need assistance with walking from room to room (with or without device)? Independent  Stairs: Did the patient need assistance with internal or external stairs (with or  without device)? N/a,  pt. Has ramped entrance  Functional Cognition: Did the patient need help planning regular tasks such as shopping or remembering to take medications? Independent  Current Functional Level Cognition  Overall Cognitive Status: Within Functional Limits for tasks assessed Orientation Level: Oriented X4, Oriented to person, Oriented to place, Oriented to time, Oriented to situation   Extremity Assessment (includes Sensation/Coordination)  Upper Extremity Assessment: Generalized weakness  Lower Extremity Assessment: LLE deficits/detail LLE Deficits / Details: limited ROM in knee flexion due to pain; otherwise, ROM WFL; strength not tested due to pain but able to move all planes against gravity    ADLs  Overall ADL's : Needs assistance/impaired Eating/Feeding: Independent, Bed level Grooming: Set up, Supervision/safety, Sitting (in recliner) Upper Body Bathing: Set up, Supervision/ safety, Sitting (in recliner) Lower Body Bathing: Total assistance (in recliner) Upper Body Dressing : Maximal assistance, Sitting (in recliner) Lower Body Dressing: Total assistance, Bed level Toilet Transfer: +2 for physical assistance, Maximal assistance, Stand-pivot (bed>recliner) Toileting- Clothing Manipulation and Hygiene: Total assistance, +2 for physical assistance, Sit to/from stand General ADL Comments: Min A to don prothesis    Mobility  Overal bed mobility: Needs Assistance Bed Mobility: Supine to Sit Supine to sit: HOB elevated, Mod assist General bed mobility comments: cues for technique with assist to elevate trunk into sitting and bring hips to EOB with use of bedpad; use of bedrails    Transfers  Overall transfer level: Needs assistance Equipment used: Rolling walker (2 wheeled), 2 person hand held assist Transfers: Sit to/from Stand, Stand Pivot Transfers Sit to Stand: Mod assist, +2 physical assistance, +2 safety/equipment, Min assist Stand pivot  transfers: Max assist, +2 physical assistance, +2 safety/equipment General transfer comment: mod A to power up into standing with cues for safe hand placement and technique and min A to maintain balance in standing with verbal and tactile cues for posture and weight shifting; pt with improved balance and ability to stand on R LE; max A to pivot to chair but demonstrated abiltiy to pivot R foot this session     Ambulation / Gait / Stairs / Wheelchair Mobility       Posture / Balance Dynamic Sitting Balance Sitting balance - Comments: improved sitting balance  Balance Overall balance assessment: Needs assistance Sitting-balance support: Feet unsupported, Single extremity supported Sitting balance-Leahy Scale: Fair Sitting balance - Comments: improved sitting balance  Standing balance support: Bilateral upper extremity supported Standing balance-Leahy Scale: Poor    Special needs/care consideration BiPAP/CPAP no CPM no Continuous Drip IV no Dialysis yes Days MWF Life Vest no Oxygen no Special Bed no Trach Size no Wound Vac (area) Yes Location L BKA site Skin Wound vac on left BKA surgical incision  Bowel mgmt: Last BM 11/29/15, continent at home Bladder mgmt: Has not produced urine while in hospital but produces urine at home Diabetic mgmt Yes     Previous Home Environment Living Arrangements: Alone Available Help at Discharge: (none) Type of Home: Apartment Home Layout: One level Home Access: Ramped entrance Bathroom Shower/Tub: Tub/shower unit, Architectural technologist: Standard Bathroom Accessibility: Yes How Accessible: Accessible via walker Home Care Services: No  Discharge Living Setting Plans for Discharge Living Setting: Patient's home Type of Home at Discharge: Apartment Discharge Home Layout: One level Discharge Home Access: Ramped entrance Discharge Bathroom Shower/Tub: Tub/shower  unit Discharge Bathroom Toilet: Standard Discharge Bathroom Accessibility: Yes How Accessible: Accessible via walker Does the patient have any problems obtaining your medications?: No  Social/Family/Support Systems Patient Roles: Other (Comment) (sibling) Anticipated  Caregiver: Pt. is anticipated to need intermittent supervision which will be provided by sisters, niece and girlfriend Anticipated Caregiver's Contact Information: see emergency contacts Ability/Limitations of Caregiver: family and friends available to supervise intermittently Caregiver Availability: Intermittent Discharge Plan Discussed with Primary Caregiver: Yes (discussed with sister, [name]) Is Caregiver In Agreement with Plan?: Yes Does Caregiver/Family have Issues with Lodging/Transportation while Pt is in Rehab?: No   Goals/Additional Needs Patient/Family Goal for Rehab: intermittent supervision at wheelchair level for PT/OT tasks; n/a SLP Expected length of stay: 12-15 days Cultural Considerations: n/a Dietary Needs: renal/carb mooified with fluid restrictions, thin liquids Equipment Needs: TBA Pt/Family Agrees to Admission and willing to participate: Yes Program Orientation Provided & Reviewed with Pt/Caregiver Including Roles & Responsibilities: Yes   Decrease burden of Care through IP rehab admission: n/a   Possible need for SNF placement upon discharge: not anticipated   Patient Condition: This patient's medical and functional status has changed since the consult dated: 12/02/15 in which the Rehabilitation Physician determined and documented that the patient's condition is appropriate for intensive rehabilitative care in an inpatient rehabilitation facility. See "History of Present Illness" (above) for medical update. Functional changes are: Pt. Able to donn prosthesis independently at EOB,+2 mod assist sit to stand and +2 max assist stand pivot transfers with prosthesis . Patient's medical and  functional status update has been discussed with the Rehabilitation physician and patient remains appropriate for inpatient rehabilitation. Will admit to inpatient rehab today.  Preadmission Screen Completed By: Gerlean Ren, 12/04/2015 4:12 PM ______________________________________________________________________  Discussed status with Dr. Posey Pronto on 12/04/15 at 1612 and received telephone approval for admission today.  Admission Coordinator: Gerlean Ren, time 5217 /Date 12/04/15          Cosigned by: Ankit Lorie Phenix, MD at 12/04/2015 4:16 PM  Revision History     Date/Time User Provider Type Action   12/04/2015 4:16 PM Ankit Lorie Phenix, MD Physician Cosign   12/04/2015 4:13 PM Gerlean Ren Rehab Admission Coordinator Sign

## 2015-12-05 ENCOUNTER — Inpatient Hospital Stay (HOSPITAL_COMMUNITY): Payer: BC Managed Care – PPO | Admitting: Physical Therapy

## 2015-12-05 ENCOUNTER — Inpatient Hospital Stay (HOSPITAL_COMMUNITY): Payer: BC Managed Care – PPO

## 2015-12-05 LAB — TYPE AND SCREEN
ABO/RH(D): O POS
Antibody Screen: NEGATIVE
Unit division: 0
Unit division: 0

## 2015-12-05 LAB — CBC
HCT: 31 % — ABNORMAL LOW (ref 36.0–46.0)
Hemoglobin: 10.4 g/dL — ABNORMAL LOW (ref 12.0–15.0)
MCH: 30.8 pg (ref 26.0–34.0)
MCHC: 33.5 g/dL (ref 30.0–36.0)
MCV: 91.7 fL (ref 78.0–100.0)
Platelets: 427 10*3/uL — ABNORMAL HIGH (ref 150–400)
RBC: 3.38 MIL/uL — ABNORMAL LOW (ref 3.87–5.11)
RDW: 15.7 % — ABNORMAL HIGH (ref 11.5–15.5)
WBC: 10.6 10*3/uL — ABNORMAL HIGH (ref 4.0–10.5)

## 2015-12-05 LAB — GLUCOSE, CAPILLARY
Glucose-Capillary: 247 mg/dL — ABNORMAL HIGH (ref 65–99)
Glucose-Capillary: 102 mg/dL — ABNORMAL HIGH (ref 65–99)
Glucose-Capillary: 187 mg/dL — ABNORMAL HIGH (ref 65–99)

## 2015-12-05 LAB — RENAL FUNCTION PANEL
Albumin: 2.5 g/dL — ABNORMAL LOW (ref 3.5–5.0)
Anion gap: 17 — ABNORMAL HIGH (ref 5–15)
BUN: 27 mg/dL — ABNORMAL HIGH (ref 6–20)
CO2: 26 mmol/L (ref 22–32)
Calcium: 8.5 mg/dL — ABNORMAL LOW (ref 8.9–10.3)
Chloride: 89 mmol/L — ABNORMAL LOW (ref 101–111)
Creatinine, Ser: 4.36 mg/dL — ABNORMAL HIGH (ref 0.44–1.00)
GFR calc Af Amer: 11 mL/min — ABNORMAL LOW (ref >60)
GFR calc non Af Amer: 10 mL/min — ABNORMAL LOW (ref >60)
Glucose, Bld: 138 mg/dL — ABNORMAL HIGH (ref 65–99)
Phosphorus: 4.5 mg/dL (ref 2.5–4.6)
Potassium: 3.7 mmol/L (ref 3.5–5.1)
Sodium: 132 mmol/L — ABNORMAL LOW (ref 135–145)

## 2015-12-05 MED ORDER — SUCRALFATE 1 GM/10ML PO SUSP
1.0000 g | Freq: Three times a day (TID) | ORAL | Status: DC
  Administered 2015-12-05 – 2015-12-08 (×12): 1 g via ORAL
  Filled 2015-12-05 (×13): qty 10

## 2015-12-05 NOTE — Progress Notes (Signed)
Orthopedic Tech Progress Note Patient Details:  Carolyn Valenzuela 1951/08/25 [id number]  Patient ID: [name], female   DOB: [birth year], 65 y.o.   MRN: [id number] Called in advanced brace order; spoke with Evangeline Dakin, Celes Dedic 12/05/2015, 11:40 AM

## 2015-12-05 NOTE — Progress Notes (Signed)
Initial Nutrition Assessment  DOCUMENTATION CODES:   Not applicable  INTERVENTION:  Continue Nepro Shake po TID, each supplement provides 425 kcal and 19 grams protein.  Provide nourishment snacks (Ordered).  Encouraged adequate PO intake.   NUTRITION DIAGNOSIS:   Increased nutrient needs related to chronic illness as evidenced by estimated needs.  GOAL:   Patient will meet greater than or equal to 90% of their needs  MONITOR:   PO intake, Supplement acceptance, Weight trends, Labs, I & O's, Skin  REASON FOR ASSESSMENT:   Consult Poor PO  ASSESSMENT:   65 y.o. 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 She was admitted on 11/29/15 for L-BKA by Dr. Sharol Given.Continuing Prevana wound VAC for 1 week post surgery. Pt with bilateral BKA.  Pt reports appetite has been improving. No recent meal completion recorded, however during time of visit pt had consumed a bowl of cereal and half of a [place] sandwich. Pt reports eating well at home PTA with usual consumption of at least 2-3 meals a day. Usual body weight reported to be ~120 lbs. Per Epic weight records, pt with a 6.4% weight loss in 4 months which is not found significant for time frame. Pt has Nepro Shake ordered and has been consuming them. RD to continue with current orders. Food choices were discussed. Pt reports most of the food served has no taste to her as there is not enough seasoning. Pt requests nourishment snacks. RD to order. Pt was encouraged to eat her food at meals and to drink her supplements.   Pt with no observed significant fat or muscle mass loss.   Labs and medications reviewed.   Diet Order:  Diet renal with fluid restriction Fluid restriction:: 1200 mL Fluid; Room service appropriate?: Yes; Fluid consistency:: Thin  Skin:   Wound (see comment) (Bilateral BKA, VAC on L BKA)  Last BM:  4/5  Height:   Ht Readings from Last 1 Encounters:  11/29/15 4\' 11"  (1.499 m)    Weight:   Wt Readings from Last 1 Encounters:  12/05/15 117 lb 8.1 oz (53.3 kg)    Ideal Body Weight:  38.8 kg (adjusted for bilateral BKA)  BMI:  Body mass index is 23.72 kg/(m^2).  Estimated Nutritional Needs:   Kcal:  1450-1650  Protein:  65-75 grams  Fluid:  1.2 L/day  EDUCATION NEEDS:   No education needs identified at this time  Corrin Parker, MS, RD, LDN Pager # [phone] After hours/ weekend pager # [phone]

## 2015-12-05 NOTE — Care Management Note (Signed)
Inpatient Rehabilitation Center Individual Statement of Services  Patient Name:  Carolyn Valenzuela  Date:  12/05/2015  Welcome to the [facility].  Our goal is to provide you with an individualized program based on your diagnosis and situation, designed to meet your specific needs.  With this comprehensive rehabilitation program, you will be expected to participate in at least 3 hours of rehabilitation therapies Monday-Friday, with modified therapy programming on the weekends.  Your rehabilitation program will include the following services:  Physical Therapy (PT), Occupational Therapy (OT), 24 hour per day rehabilitation nursing, Therapeutic Recreaction (TR), Neuropsychology, Case Management (Social Worker), Rehabilitation Medicine, Nutrition Services and Pharmacy Services  Weekly team conferences will be held on Wednesday to discuss your progress.  Your Social Worker will talk with you frequently to get your input and to update you on team discussions.  Team conferences with you and your family in attendance may also be held.  Expected length of stay: 10-14 days  Overall anticipated outcome: supervision/mod/i level  Depending on your progress and recovery, your program may change. Your Social Worker will coordinate services and will keep you informed of any changes. Your Social Worker's name and contact numbers are listed  below.  The following services may also be recommended but are not provided by the [facility]:    [facility] will be made to provide these services after discharge if needed.  Arrangements include referral to agencies that provide these services.  Your insurance has been verified to be:  [employer] Your primary doctor is:  Cathlean Cower  Pertinent information will be shared with your doctor and your insurance company.  Social Worker:  Ovidio Kin, [place] or [phone]  Information discussed with and copy given to patient by: Elease Hashimoto, 12/05/2015, 10:43 AM

## 2015-12-05 NOTE — Progress Notes (Signed)
Subjective:  Got 2u prbc w HD yesterday.  No c/o's today. Still on nasal cannula O2.    Objective Vital signs in last 24 hours: Filed Vitals:   12/04/15 1803 12/05/15 0423  BP: 115/48 117/49  Pulse: 79 70  Temp: 100.1 F (37.8 C) 98.9 F (37.2 C)  TempSrc: Oral Oral  Resp: 18 18  Weight:  53.3 kg (117 lb 8.1 oz)  SpO2: 99% 100%   Weight change:    Physical Exam: General: Thin,chronically ill AAF NAD/ OX3 on HD Lungs: CTA unlabored. Heart: RRR No murmurs, rubs, or gallops appreciated. Abdomen: Soft, non-tender, non-distended . Lower extremities: L Transtibial Amp. Site with wound vac / R BKA . [name] Dialysis Access: LUA AVF patent on hd   3/31 CXR clear 4/4 CXR no active disease  Dialysis: [facility] MWF  4h  52kg preBKA  3K/2.25 bath  P4  LUA AVF  Hep 2500 Mircera: 50 mcg IV q 2 weeks (11/13/15) Hectoral: 4 mcg IV Q TTS (last dose 11/25/15)  Problems: 1. Gangrene L foot sp L BKA 3/31 - has wound vac, now on rehab at [facility] 2. ESRD - [facility] MWF HD 3. HTN - BP's soft, holding BP meds 4. Volume excess - should be down around 49 kg after amp. Wt's prob not accurate 5. Anemiad/t CKD/ABL - Hb 5.9 yest, sp 2u prbcs; and esa ^'d 50 > 100 ug/ wk 6. Metabolic bone disease - phos 8.1>6.7 cont binder/ VDRA 7. Nutrition - Last in-center Albumin 3.5.>2.4 . Renal carb mod diet, renal vit and add nepro  8. DM: per primary 9. O2 requirement - will ask RN to wean off O2 10. Disposition: in hosp rehab  noted   Plan -  F/u CBC/ lytes today. Get hoyer wt. HD Friday. Rehab  Kelly Splinter MD [facility] pager [phone]    cell [phone] 12/05/2015, 8:37 AM    Labs: Basic Metabolic Panel:  Recent Labs Lab 11/29/15 2250 11/30/15 1156 12/02/15 0518 12/04/15 0829  NA 135 136 128* 123*  K 6.4* 4.2 4.9 5.2*  CL 97* 94* 88* 85*  CO2 20* 25 26 23   GLUCOSE 232* 118* 39* 111*  BUN 65* 15 39* 41*  CREATININE 11.69* 4.19* 7.88* 7.16*  CALCIUM 8.4* 8.8* 8.0* 8.0*  PHOS  8.1*  --  6.7* 6.6*   Liver Function Tests:  Recent Labs Lab 11/29/15 2250 12/02/15 0518 12/04/15 0829  ALBUMIN 2.7* 2.4* 2.1*   No results for input(s): LIPASE, AMYLASE in the last 168 hours. No results for input(s): AMMONIA in the last 168 hours. CBC:  Recent Labs Lab 11/29/15 2250 12/02/15 0518 12/04/15 0829  WBC 10.2 9.9 8.1  HGB 8.4* 7.2* 5.9*  HCT 25.0* 22.5* 17.7*  MCV 92.9 92.6 91.7  PLT 354 381 359   Cardiac Enzymes: No results for input(s): CKTOTAL, CKMB, CKMBINDEX, TROPONINI in the last 168 hours. CBG:  Recent Labs Lab 12/04/15 0643 12/04/15 1328 12/04/15 1606 12/04/15 2119 12/05/15 0657  GLUCAP 98 131* 299* 247* 102*    Studies/Results: Dg Chest Port 1 View  12/03/2015  CLINICAL DATA:  Hypoxemia EXAM: PORTABLE CHEST 1 VIEW COMPARISON:  11/29/2015 FINDINGS: Normal heart size. Lungs clear. No pneumothorax. No pleural effusion. IMPRESSION: No active disease. Electronically Signed   By: Marybelle Killings M.D.   On: 12/03/2015 11:58   Medications:   . [START ON 12/11/2015] darbepoetin (ARANESP) injection - DIALYSIS  100 mcg Intravenous Q Wed-HD  . famotidine  10 mg Oral Daily  .  feeding supplement (NEPRO CARB STEADY)  237 mL Oral TID WC  . gabapentin  100 mg Oral BID  . heparin subcutaneous  5,000 Units Subcutaneous 3 times per day  . insulin aspart  0-10 Units Subcutaneous TID WC & HS  . senna-docusate  1 tablet Oral QHS  . sevelamer carbonate  1,600 mg Oral TID WC  . simvastatin  20 mg Oral q1800

## 2015-12-05 NOTE — Evaluation (Signed)
Physical Therapy Assessment and Plan  Patient Details  Name: Carolyn Valenzuela MRN: [id number] Date of Birth: 1951-04-26  PT Diagnosis: Abnormality of gait, Difficulty walking and Muscle weakness Rehab Potential: Good ELOS: 10-14 days    Today's Date: 12/05/2015   PT Individual Time: 0800-0903 And 1130- 1200 PT Individual Time Calculation (min): 63 min  30 min  Problem List:  Patient Active Problem List   Diagnosis Date Noted  . Unilateral complete BKA ([place]) 12/04/2015  . S/P bilateral BKA (below knee amputation) ([place])   . Abnormality of gait   . Muscle spasm   . ESRD on dialysis ([place])   . Type 2 diabetes mellitus with diabetic peripheral angiopathy and gangrene, without long-term current use of insulin ([place])   . Post-operative pain   . Benign essential HTN   . Metabolic bone disease   . Hypoxia   . Type 2 diabetes mellitus with peripheral neuropathy (HCC)   . Labile blood glucose   . End stage renal disease ([place])   . Postoperative pain   . Acute blood loss anemia   . Anemia of chronic disease   . Below knee amputation status ([place]) 11/29/2015  . Odynophagia 11/12/2015  . Chest pain, atypical 06/06/2015  . Chest pain 05/28/2015  . Ingrown nail 05/28/2015  . Acute pulmonary edema (HCC)   . FUO (fever of unknown origin)   . HCAP (healthcare-associated pneumonia)   . ESRD needing dialysis ([place])   . Type 2 diabetes mellitus with ESRD (end-stage renal disease) ([place]) 09/01/2014  . Anemia of renal disease 09/01/2014  . Venous insufficiency of left leg 08/27/2014  . Rash 07/06/2014  . S/P BKA (below knee amputation) ([place]) 03/20/2014  . 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 Critical lower limb ischemia 01/09/2014  . Right foot ulcer ([place]) 11/07/2013  . Toe pain 09/06/2013  . Pre-ulcerative corn or callous 09/06/2013  . Lower extremity pain 09/06/2013  . Left low back pain 12/08/2011  . Hip bursitis, left 12/08/2011  . Left leg pain 12/08/2011  . Abdominal  pain, other specified site 07/08/2011  . Preventative health care 12/08/2010  . Allergic rhinitis, cause unspecified 12/08/2010  . SECONDARY HYPERPARATHYROIDISM 05/21/2009  . NUMBNESS 05/21/2009  . BACK PAIN 05/02/2009  . ANEMIA-IRON DEFICIENCY 01/25/2008  . Essential hypertension 01/25/2008  . CERVICAL RADICULOPATHY, LEFT 01/25/2008  . Hyperlipidemia 03/30/2007    Past Medical History:  Past Medical History  Diagnosis Date  . DIABETES MELLITUS, UNCONTROLLED 05/21/2009  . HYPERLIPIDEMIA 03/30/2007  . ANEMIA-IRON DEFICIENCY 01/25/2008  . HYPERTENSION 01/25/2008  . SINUSITIS- ACUTE-NOS 01/25/2008  . SECONDARY HYPERPARATHYROIDISM 05/21/2009  . CERVICAL RADICULOPATHY, LEFT 01/25/2008  . BACK PAIN 05/02/2009  . NUMBNESS 05/21/2009  . Allergic rhinitis, cause unspecified 12/08/2010  . Foot ulcer ([place])     right lateral malleolus  . Critical lower limb ischemia   . RENAL INSUFFICIENCY 02/01/2008    HD pt.   Past Surgical History:  Past Surgical History  Procedure Laterality Date  . Av fistula placement Left   . Eye surgery Bilateral     cataracts removed, left eye still has some oil in it.  . Colonoscopy    . Amputation Right 03/15/2014    Procedure: RIGHT  LEG  BELOW KNEE AMPUTATION ;  Surgeon: [name], MD;  Location: [facility];  Service: Orthopedics;  Laterality: Right;  . Insertion of dialysis catheter N/A 09/03/2014    Procedure: INSERTION OF DIALYSIS CATHETER RIGHT INTERNAL JUGULAR VEIN;  Surgeon:  Conrad Ossineke, MD;  Location: [facility];  Service: Vascular;  Laterality: N/A;  . Av fistula placement Left 09/06/2014    Procedure: ARTERIOVENOUS (AV) FISTULA CREATION-Left Brachiocephalic;  Surgeon: Conrad Garza-Salinas II, MD;  Location: [facility];  Service: Vascular;  Laterality: Left;  . Peripheral vascular catheterization N/A 08/13/2015    Procedure: Abdominal Aortogram;  Surgeon: Elam Dutch, MD;  Location: [facility];  Service: Cardiovascular;  Laterality: N/A;  . Amputation Left 11/29/2015     Procedure: AMPUTATION BELOW KNEE;  Surgeon: Newt Minion, MD;  Location: [facility];  Service: Orthopedics;  Laterality: Left;    Assessment & Plan Clinical Impression: Patient is a 65 y.o. 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 She was admitted on 11/29/15 for L-BKA by Dr. Sharol Given. Post op with sedation due to narcotics but has been showing good motivation with improvement in mentation. Noted to have significant acute on chronic anemia with drop in H/H to 5.9/17.7 and to be transfused with 2 units PRBC today. Dr. Sharol Given recommends continuing Prevana wound VAC for 1 week post surgery.   Patient transferred to CIR on 12/04/2015 .   Patient currently requires mod with mobility secondary to muscle weakness, decreased cardiorespiratoy endurance and decreased balance strategies.  Prior to hospitalization, patient was modified independent  with mobility and lived with Alone in a [place] home.  Home access is  Ramped entrance.  Patient will benefit from skilled PT intervention to maximize safe functional mobility, minimize fall risk and decrease caregiver burden for planned discharge home with intermittent assist.  Anticipate patient will benefit from follow up [facility] at discharge.  PT - End of Session Activity Tolerance: Tolerates 30+ min activity with multiple rests Endurance Deficit: Yes PT Assessment Rehab Potential (ACUTE/IP ONLY): Good Barriers to Discharge: Decreased caregiver support PT Patient demonstrates impairments in the following area(s): Balance;Endurance;Pain;Safety PT Transfers Functional Problem(s): Bed Mobility;Bed to Chair;Car;Furniture;Floor PT Locomotion Functional Problem(s): Ambulation;Wheelchair Mobility;Stairs PT Plan PT Intensity: Minimum of 1-2 x/day ,45 to 90 minutes PT Frequency: 5 out of 7  days PT Duration Estimated Length of Stay: 10-14 days  PT Treatment/Interventions: Ambulation/gait training;Balance/vestibular training;Community reintegration;Discharge planning;Disease management/prevention;DME/adaptive equipment instruction;Functional mobility training;Neuromuscular re-education;Pain management;Patient/family education;Psychosocial support;Skin care/wound management;Splinting/orthotics;Stair training;Therapeutic Activities;Therapeutic Exercise;UE/LE Strength taining/ROM;UE/LE Coordination activities;Visual/perceptual remediation/compensation;Wheelchair propulsion/positioning PT Transfers Anticipated Outcome(s): Supervision A  PT Locomotion Anticipated Outcome(s): Min A with gait for short distances. WC for commnuity mobillity PT Recommendation Follow Up Recommendations: Home health PT Patient destination: Home  Skilled Therapeutic Intervention Session 1 Patient received supine in bed and agreeable to PT. PT performed evaluation and initiated PT treatment. See below for results. WC mobility for 185f with Supervision A. Sit<>stand transfer training with Mod A and cue for UE positioning x 10 throughout treatment. PT attempted gait with RW, but patient unable to clear R LE with push through arms for hop-to gait pattern. Patient instructed in car transfer with RW and Max A from PT for increased stability, AD management and R LE movement with stand pivot. Patient returned to room and left with trade off to OT.   Session 2 Patient received sitting in WC and agreeable to PT. Patient reports increased drowsiness secondary to pain medication. Patient instructed by PT in Slide board to and from mat table with Min A and cues for UE and LE positioning. Slide board transfer  to car with Min Guard from PT with constant cues for positioning and to maintain non-weight bearing on the LLE. PT instructed in slide board transfer to and from bed with min Guard and cues for sequencing and LE positioning.  Bed chair transfer with squat pivot with min A and cues for UE placement x 2. Patient left supine in bed with call bell within reach.   PT Evaluation Precautions/Restrictions Precautions Precautions: Fall Precaution Comments: wound vac Required Braces or Orthoses: Other Brace/Splint Other Brace/Splint: has prosthetic for RLE Restrictions Weight Bearing Restrictions: Yes LLE Weight Bearing: Non weight bearing Other Position/Activity Restrictions: LLE NWB General Chart Reviewed: Yes Additional Pertinent History: CKD. DMII Vital Signs Pain Pain Assessment Pain Assessment: 0-10 Pain Score: 7  Pain Type: Acute pain;Phantom pain Pain Location: Leg Pain Orientation: Left Pain Descriptors / Indicators: Sharp;Aching Pain Frequency: Intermittent Pain Onset: With Activity Patients Stated Pain Goal: 4 Pain Intervention(s): Medication (See eMAR) Home Living/Prior Functioning Home Living Available Help at Discharge: Family;Available PRN/intermittently Type of Home: Apartment Home Access: Ramped entrance Home Layout: One level Bathroom Shower/Tub: Chiropodist: Standard Bathroom Accessibility: Yes  Lives With: Alone Prior Function Level of Independence: Independent with basic ADLs;Requires assistive device for independence  Able to Take Stairs?: Yes Driving: No Vocation: Retired Tax adviser Overall Cognitive Status: Within Functional Limits for tasks assessed Arousal/Alertness: Awake/alert Orientation Level: Oriented X4 Attention: Selective Selective Attention: Appears intact Memory: Appears intact Awareness: Appears intact Problem Solving: Appears intact Safety/Judgment: Appears intact Sensation Sensation Light Touch: Appears Intact Stereognosis: Appears Intact Hot/Cold: Appears Intact Proprioception: Appears Intact Coordination Gross Motor Movements are Fluid and Coordinated: Yes Fine Motor Movements are Fluid and Coordinated:  Yes Finger Nose Finger Test: [facility] Motor  Motor Motor: Within Functional Limits  Mobility Bed Mobility Bed Mobility: Rolling Right;Rolling Left;Supine to Sit;Sit to Supine Rolling Right: 5: Supervision Rolling Right Details: Verbal cues for sequencing;Verbal cues for technique;Verbal cues for precautions/safety Rolling Left: 5: Supervision Rolling Left Details: Verbal cues for sequencing;Verbal cues for technique;Verbal cues for precautions/safety Supine to Sit: 5: Supervision Supine to Sit Details: Verbal cues for sequencing;Verbal cues for technique;Verbal cues for precautions/safety Sit to Supine: 5: Supervision Sit to Supine - Details: Verbal cues for gait pattern;Verbal cues for safe use of DME/AE;Manual facilitation for weight shifting Transfers Transfers: Yes Sit to Stand: 3: Mod assist Sit to Stand Details: Verbal cues for sequencing;Verbal cues for technique;Verbal cues for precautions/safety;Visual cues/gestures for precautions/safety Stand to Sit: 3: Mod assist Stand to Sit Details (indicate cue type and reason): Verbal cues for sequencing;Verbal cues for technique;Verbal cues for precautions/safety Stand Pivot Transfers: 3: Mod assist Stand Pivot Transfer Details: Verbal cues for sequencing;Verbal cues for technique;Verbal cues for precautions/safety;Verbal cues for gait pattern;Verbal cues for safe use of DME/AE;Tactile cues for placement;Manual facilitation for placement Locomotion  Ambulation Ambulation: No Gait Gait: No Stairs / Additional Locomotion Stairs: No Wheelchair Mobility Wheelchair Mobility: Yes Wheelchair Assistance: 5: Investment banker, operational Details: Verbal cues for sequencing;Verbal cues for Marketing executive: Both upper extremities Wheelchair Parts Management: Supervision/cueing Distance: 100  Trunk/Postural Assessment  Cervical Assessment Cervical Assessment: Within Functional Limits Thoracic Assessment Thoracic  Assessment: Within Functional Limits Lumbar Assessment Lumbar Assessment: Within Functional Limits Postural Control Postural Control: Within Functional Limits  Balance Balance Balance Assessed: Yes Static Sitting Balance Static Sitting - Comment/# of Minutes: Able to maintain balance without UE support.  Dynamic Sitting Balance Sitting balance - Comments: Able to maintain balance without UE or LE Support  to donn R LE prosthesis Static Standing Balance Static Standing - Comment/# of Minutes: Able to maintain standing balance with BUE support on RW and min A Extremity Assessment      RLE Assessment RLE Assessment: Within Functional Limits (4+/5 for hip and Knee) LLE Assessment LLE Assessment: Within Functional Limits (4+/5 for hip. unable to assess knee)   See Function Navigator for Current Functional Status.   Refer to Care Plan for Long Term Goals  Recommendations for other services: None  Discharge Criteria: Patient will be discharged from PT if patient refuses treatment 3 consecutive times without medical reason, if treatment goals not met, if there is a change in medical status, if patient makes no progress towards goals or if patient is discharged from hospital.  The above assessment, treatment plan, treatment alternatives and goals were discussed and mutually agreed upon: by patient  Lorie Phenix 12/05/2015, 10:42 AM

## 2015-12-05 NOTE — Progress Notes (Signed)
Social Work  Social Work Assessment and Plan  Patient Details  Name: Carolyn Valenzuela MRN: [id number] Date of Birth: [birth year]  Today's Date: 12/05/2015  Problem List:  Patient Active Problem List   Diagnosis Date Noted  . Unilateral complete BKA ([place]) 12/04/2015  . S/P bilateral BKA (below knee amputation) ([place])   . Abnormality of gait   . Muscle spasm   . ESRD on dialysis ([place])   . Type 2 diabetes mellitus with diabetic peripheral angiopathy and gangrene, without long-term current use of insulin ([place])   . Post-operative pain   . Benign essential HTN   . Metabolic bone disease   . Hypoxia   . Type 2 diabetes mellitus with peripheral neuropathy (HCC)   . Labile blood glucose   . End stage renal disease ([place])   . Postoperative pain   . Acute blood loss anemia   . Anemia of chronic disease   . Below knee amputation status ([place]) 11/29/2015  . Odynophagia 11/12/2015  . Chest pain, atypical 06/06/2015  . Chest pain 05/28/2015  . Ingrown nail 05/28/2015  . Acute pulmonary edema (HCC)   . FUO (fever of unknown origin)   . HCAP (healthcare-associated pneumonia)   . ESRD needing dialysis ([place])   . Type 2 diabetes mellitus with ESRD (end-stage renal disease) ([place]) 09/01/2014  . Anemia of renal disease 09/01/2014  . Venous insufficiency of left leg 08/27/2014  . Rash 07/06/2014  . S/P BKA (below knee amputation) ([place]) 03/20/2014  . 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 Critical lower limb ischemia 01/09/2014  . Right foot ulcer ([place]) 11/07/2013  . Toe pain 09/06/2013  . Pre-ulcerative corn or callous 09/06/2013  . Lower extremity pain 09/06/2013  . Left low back pain 12/08/2011  . Hip bursitis, left 12/08/2011  . Left leg pain 12/08/2011  . Abdominal pain, other specified site 07/08/2011  . Preventative health care 12/08/2010  . Allergic rhinitis, cause unspecified 12/08/2010  . SECONDARY HYPERPARATHYROIDISM 05/21/2009  . NUMBNESS 05/21/2009  . BACK PAIN  05/02/2009  . ANEMIA-IRON DEFICIENCY 01/25/2008  . Essential hypertension 01/25/2008  . CERVICAL RADICULOPATHY, LEFT 01/25/2008  . Hyperlipidemia 03/30/2007   Past Medical History:  Past Medical History  Diagnosis Date  . DIABETES MELLITUS, UNCONTROLLED 05/21/2009  . HYPERLIPIDEMIA 03/30/2007  . ANEMIA-IRON DEFICIENCY 01/25/2008  . HYPERTENSION 01/25/2008  . SINUSITIS- ACUTE-NOS 01/25/2008  . SECONDARY HYPERPARATHYROIDISM 05/21/2009  . CERVICAL RADICULOPATHY, LEFT 01/25/2008  . BACK PAIN 05/02/2009  . NUMBNESS 05/21/2009  . Allergic rhinitis, cause unspecified 12/08/2010  . Foot ulcer ([place])     right lateral malleolus  . Critical lower limb ischemia   . RENAL INSUFFICIENCY 02/01/2008    HD pt.   Past Surgical History:  Past Surgical History  Procedure Laterality Date  . Av fistula placement Left   . Eye surgery Bilateral     cataracts removed, left eye still has some oil in it.  . Colonoscopy    . Amputation Right 03/15/2014    Procedure: RIGHT  LEG  BELOW KNEE AMPUTATION ;  Surgeon: [name], MD;  Location: [place];  Service: Orthopedics;  Laterality: Right;  . Insertion of dialysis catheter N/A 09/03/2014    Procedure: INSERTION OF DIALYSIS CATHETER RIGHT INTERNAL JUGULAR VEIN;  Surgeon: Conrad Rushville, MD;  Location: [facility];  Service: Vascular;  Laterality: N/A;  . Av fistula placement Left 09/06/2014    Procedure: ARTERIOVENOUS (AV) FISTULA CREATION-Left Brachiocephalic;  Surgeon: Conrad Nondalton, MD;  [facility];  Service: Vascular;  Laterality: Left;  . Peripheral vascular catheterization N/A 08/13/2015    Procedure: Abdominal Aortogram;  Surgeon: Elam Dutch, MD;  Location: [facility] [place] [facility];  Service: Cardiovascular;  Laterality: N/A;  . Amputation Left 11/29/2015    Procedure: AMPUTATION BELOW KNEE;  Surgeon: Newt Minion, MD;  Location: [facility];  Service: Orthopedics;  Laterality: Left;   Social History:  reports that she has never smoked. She has never used smokeless tobacco.  She reports that she does not drink alcohol or use illicit drugs.  Family / Support Systems Marital Status: Single Patient Roles: Other (Comment) (sibling) Other Supports: [name]-sister  [id number]  [name] Hayes-sister-[phone]-cell Anticipated Caregiver: Sister's to priovide intermittent assist Ability/Limitations of Caregiver: Pt will not have caregiver at discharge will need to be mod/i wheelchair level atleast Caregiver Availability: Intermittent Family Dynamics: Close knit family, she has three siblings who are involved and assist her at home. All do work and can only provide intermittent assist. Pt was ambulating with a cane prior admission, so informed her will not get back to this level at discharge from here.  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 Her sister's are suppotive and here daily   Abuse/Neglect Physical Abuse: Denies Verbal Abuse: Denies Sexual Abuse: Denies Exploitation of patient/patient's resources: Denies Self-Neglect: Denies  Emotional Status Pt's affect, behavior adn adjustment status: Pt is motivated to improve but feels she will need help at home and thinks she can get Medicaid now. Her family works and do come by and check on her but can not provide hands on care. Pt is glad to be back here on rehab and beginning to move again. She has always been able to take care of herself she hopes she can again. Recent Psychosocial Issues: other health issues-R-BKA, ESRD, anemia, etc Pyschiatric History: No history she reports she takes each day at a time. She has been through many health issues and she gets pushing forward and has been meeting the  challenges at hand. Will ask team if would benefit from neuro-psych seeing while here. She appears to be coping appropriatley. Substance Abuse History: No issues  Patient / Family Perceptions, Expectations & Goals Pt/Family understanding of illness & functional limitations: Pt and sister can explain her amputation and talks with the MD to get their questions and concerns addressed. Pt reports it started with her toe nail and progressed from there. She has been on rehab in 2015 and did well, she hopes to do so again. Premorbid pt/family roles/activities: sister, retiree, aunt, dialysis pt, church member, etc Anticipated changes in roles/activities/participation: resume Pt/family expectations/goals: Pt states: " I'm going to need some help this time, what can I get?"  Sister states: " She needs to stay long enough she can be independent from a wheelchair level."  [employer]: Other (Comment) ([id number]-[employer] [address]( [place])) Premorbid Home Care/DME Agencies: Other (Comment) (had [facility] in the past) Transportation available at discharge: [employer] takes to dialysis treamtents and sister's take to church and errands Resource referrals recommended: Neuropsychology, Support group (specify)  Discharge Planning Living Arrangements: Alone Support Systems: Other relatives, Water engineer, Social worker community Type of Residence: Private residence Insurance Resources: Multimedia programmer (specify) Printmaker) Living Expenses: Rent Money Management: Patient Does the  patient have any problems obtaining your medications?: No Home Management: patient and sister's help with Patient/Family Preliminary Plans: Return home with intermittent assist from her sister's, nieces's and church members. She will need to be mod/i wheelchair level to be able to go home alone safely. Pt feels she will need some help at home and feels she can get Medicaid now, has not applied for this. Will  talk with sister's and see if one will apply for her to see if eligible.  Social Work Anticipated Follow Up Needs: HH/OP, Support Group  Clinical Impression Pleasant female who is motivated to improve but is now a bilateral amputee and feels she will need some help at home this time around. Discussed her sister can apply for Medicaid and see if she is eligible but it does take 45 days to determine eligibility. Her sister's and nieces will check on her and take to church and errands, she has [employer] transport to HD and they do assist her into the vehicle. She will need to be mod/i wheelchair level to be safe at home alone upon Discharge. Her insurance now came to rehab will not cover NHP, so her options are limited. Will await team's evaluations and develop a safe discharge plan.  Elease Hashimoto 12/05/2015, 11:07 AM

## 2015-12-05 NOTE — Progress Notes (Signed)
Patient information reviewed and entered into eRehab system by Kierra Jezewski, RN, CRRN, PPS Coordinator.  Information including medical coding and functional independence measure will be reviewed and updated through discharge.    

## 2015-12-05 NOTE — Progress Notes (Signed)
Physical Therapy Session Note  Patient Details  Name: Carolyn Valenzuela MRN: [id number] Date of Birth: February 04, 1951  Today's Date: 12/05/2015 PT Individual Time: 1416-1501 PT Individual Time Calculation (min): 45 min   Short Term Goals: Week 1:  PT Short Term Goal 1 (Week 1): bed mobility without cues or Assistance.  PT Short Term Goal 2 (Week 1): Bed chair transfer with Min A with sqaut pivot.  PT Short Term Goal 3 (Week 1): Sit to stand with RW with min A.  PT Short Term Goal 4 (Week 1): gait with RW for 32f with min A.   Skilled Therapeutic Interventions/Progress Updates:    Patient received supine in bed with [facility] rep, CGerald Stabs present at bed side. PT was informed that patient had been supplied with several Residual limb socks to improve fit of Prosthesis. Patient performed bed mobility to sit EOB with supervision A from PT with heavy use of bed rails and min verbal instruction for proper UE positioning to ease transition. Patient performed sit to stand with RW with Mod A x 3 with to test fit of 5 ply sock in socket. Patient reports decreased pain at patellar tendon compared to standing without added cushion from Sock. Patient requested to have Prosthetic leg adjusted by Prosthetist; PT advised patient to try using increased ply socks for a day to see if that reduced pain from pressure the patellar tendon.  Patient performed gait training for 3 step at EOB with mod A from PT, heavy verbal instruction and RW. Patient performed WC mobility for 1254fwith supervision A and cues for improved use of L UE. PT instructed patient in squat pivot transfer to various surface heights x3 in each direction with Mod A and moderate verbal instruction for improved UE and LE positioning. Patient returned to room and transferred to bed with squat pivot with mod A. Bed mobility with Supervision A to supine. Patient left with call bell within reach and all other needs met.   Therapy Documentation Precautions:   Precautions Precautions: Fall Precaution Comments: wound vac Required Braces or Orthoses: Other Brace/Splint Other Brace/Splint: has prosthetic for RLE Restrictions Weight Bearing Restrictions: Yes LLE Weight Bearing: Non weight bearing Other Position/Activity Restrictions: LLE NWB General:   Vital Signs: Therapy Vitals Temp: 98.9 F (37.2 C) Temp Source: Oral Pulse Rate: 73 Resp: 17 BP: (!) 132/46 mmHg Patient Position (if appropriate): Lying Oxygen Therapy SpO2: 95 % O2 Device: Not Delivered See Function Navigator for Current Functional Status.   Therapy/Group: Individual Therapy  AuLorie Phenix/01/2016, 5:14 PM

## 2015-12-05 NOTE — Progress Notes (Signed)
[place] PHYSICAL MEDICINE & REHABILITATION     PROGRESS NOTE  Subjective/Complaints:  Lying in bed this morning talking on the phone. She states she slept well on her first night. She is ready for therapies to begin today and wants to know when she needs to do.  ROS: Denies CP, SOB, N/V/D.  Objective: Vital Signs: Blood pressure 117/49, pulse 70, temperature 98.9 F (37.2 C), temperature source Oral, resp. rate 18, weight 53.3 kg (117 lb 8.1 oz), SpO2 100 %. Dg Chest Port 1 View  12/03/2015  CLINICAL DATA:  Hypoxemia EXAM: PORTABLE CHEST 1 VIEW COMPARISON:  11/29/2015 FINDINGS: Normal heart size. Lungs clear. No pneumothorax. No pleural effusion. IMPRESSION: No active disease. Electronically Signed   By: Marybelle Killings M.D.   On: 12/03/2015 11:58    Recent Labs  12/04/15 0829  WBC 8.1  HGB 5.9*  HCT 17.7*  PLT 359    Recent Labs  12/04/15 0829  NA 123*  K 5.2*  CL 85*  GLUCOSE 111*  BUN 41*  CREATININE 7.16*  CALCIUM 8.0*   CBG (last 3)   Recent Labs  12/04/15 1606 12/04/15 2119 12/05/15 0657  GLUCAP 299* 247* 102*    Wt Readings from Last 3 Encounters:  12/05/15 53.3 kg (117 lb 8.1 oz)  12/04/15 51 kg (112 lb 7 oz)  11/25/15 54.885 kg (121 lb)    Physical Exam:  BP 117/49 mmHg  Pulse 70  Temp(Src) 98.9 F (37.2 C) (Oral)  Resp 18  Wt 53.3 kg (117 lb 8.1 oz)  SpO2 100% Constitutional: She appears well-developed. She has a sickly appearance.  HENT:  Normocephalic and atraumatic.   Eyes: Conjunctivae and EOM are normal.  Cardiovascular: Normal rate and regular rhythm.No murmur heard. Respiratory: Effort normal and breath sounds normal. No respiratory distress. She has no wheezes.  GI: Soft. Bowel sounds are normal. She exhibits no distension. There is no tenderness.  Musculoskeletal: She exhibits tenderness. She exhibits no edema.  VAC on place on L-BKA amp site. R-BKA healed well  Neurological: She is alert and oriented.  Motor: Bilateral upper  extremities: 4+/5 proximal to distal Right lower extremity: BKA, 4+/5 hip flexion  Left lower extremity: Hip flexion 4+/5 (pain inhibition)  Skin: Skin is warm and dry.  VAC on left BKA, suctioning without leaks.  Psychiatric: She has a normal mood and affect. Her behavior is normal. Thought content normal.    Assessment/Plan: 1. Functional deficits secondary to to left BKA, with previous right BKA which require 3+ hours per day of interdisciplinary therapy in a comprehensive inpatient rehab setting. Physiatrist is providing close team supervision and 24 hour management of active medical problems listed below. Physiatrist and rehab team continue to assess barriers to discharge/monitor patient progress toward functional and medical goals.  Function:  Bathing Bathing position      Bathing parts      Bathing assist        Upper Body Dressing/Undressing Upper body dressing                    Upper body assist        Lower Body Dressing/Undressing Lower body dressing                                  Lower body assist        Toileting Toileting  Toileting assist     Transfers Chair/bed Physiological scientist Comprehension Comprehension assist level: Understands complex 90% of the time/cues 10% of the time  Expression Expression assist level: Expresses complex 90% of the time/cues < 10% of the time  Social Interaction Social Interaction assist level: Interacts appropriately 90% of the time - Needs monitoring or encouragement for participation or interaction.  Problem Solving Problem solving assist level: Solves complex 90% of the time/cues < 10% of the time  Memory Memory assist level: Recognizes or recalls 90% of the time/requires cueing < 10% of the time    Medical Problem List and Plan: 1. Abnormality of gait, safety, deficits with transfers secondary to left BKA,  with previous right BKA.  Begin CIR 2. DVT Prophylaxis/Anticoagulation: Pharmaceutical: Lovenox 3. Pain Management: Hydrocodone prn for pain, low dose robaxin to help with muscle spasms.  4. Mood: Very motivated to get better and resume independent living. LCSW to follow for evaluation and support.  5. Neuropsych: This patient is capable of making decisions on her own behalf. 6. Skin/Wound Care: Routine pressure relief measures.   7. Fluids/Electrolytes/Nutrition: Labs being monitored ---hyponateremia/hyperkalemia being managed by HD, appreciate recs.  8. ESRD: On renal diet with 1200 FR. Hemodialysis after therapy sessions on MWF to help with tolerance of therapy.  9. DMT2: Monitor BS ac/hs.  Will remove diabetic retrictions to help with food choices/intake. Use SSI for elevated BS.   Continue to hold glipizide 5 mg due to low BS and poor intake.  Will continue to monitor and restart when appropriate 10 HTN: Monitor BID. Has been low due to anemia and deconditioning. Monitor for now.  11. Anemia of chronic disease: Continue Aransep weekly.  12. Metabolic bone disease: On renvela.  13. Hypoxia: Improving. Encourage IS.   LOS (Days) 1 A FACE TO FACE EVALUATION WAS PERFORMED  Ankit Lorie Phenix 12/05/2015 9:13 AM

## 2015-12-05 NOTE — IPOC Note (Signed)
Overall Plan of Care [facility]) Patient Details Name: Carolyn Valenzuela MRN: [id number] DOB: 20-Jun-1951  Admitting Diagnosis: L BKA  Hospital Problems: Active Problems:   Unilateral complete BKA ([place])   S/P bilateral BKA (below knee amputation) ([place])   Abnormality of gait   Muscle spasm   ESRD on dialysis ([place])   Type 2 diabetes mellitus with diabetic peripheral angiopathy and gangrene, without long-term current use of insulin ([facility])   Post-operative pain   Benign essential HTN   Metabolic bone disease   Hypoxia     Functional Problem List: Nursing Bowel, Endurance, Medication Management, Motor, Nutrition, Pain, Sensory, Skin Integrity  PT Balance, Endurance, Pain, Safety  OT Balance, Endurance, Pain  SLP    TR         Basic ADL's: OT Bathing, Dressing, Toileting     Advanced  ADL's: OT Simple Meal Preparation, Light Housekeeping     Transfers: PT Bed Mobility, Bed to Chair, Car, [employer], Floor  OT Toilet, Metallurgist: PT Ambulation, Emergency planning/management officer, Stairs     Additional Impairments: OT    SLP        TR      Anticipated Outcomes Item Anticipated Outcome  Self Feeding Mod I  Swallowing      Basic self-care  Mod I  Toileting  Mod I   Bathroom Transfers Supervision  Bowel/Bladder  Min assist  Transfers  Supervision A   Locomotion  Min A with gait for short distances. WC for commnuity mobillity  Communication     Cognition     Pain  < 4  Safety/Judgment  Mod I   Therapy Plan: PT Intensity: Minimum of 1-2 x/day ,45 to 90 minutes PT Frequency: 5 out of 7 days PT Duration Estimated Length of Stay: 10-14 days  OT Intensity: Minimum of 1-2 x/day, 45 to 90 minutes OT Frequency: 5 out of 7 days OT Duration/Estimated Length of Stay: 10-14 days         Team Interventions: Nursing Interventions Patient/Family Education, Bowel Management, Disease Management/Prevention, Skin Care/Wound Management, Medication Management, Pain  Management  PT interventions Ambulation/gait training, Training and development officer, Community reintegration, Discharge planning, Disease management/prevention, DME/adaptive equipment instruction, Functional mobility training, Neuromuscular re-education, Pain management, Patient/family education, Psychosocial support, Skin care/wound management, Splinting/orthotics, Stair training, Therapeutic Activities, Therapeutic Exercise, UE/LE Strength taining/ROM, UE/LE Coordination activities, Visual/perceptual remediation/compensation, Wheelchair propulsion/positioning  OT Interventions Training and development officer, Discharge planning, Self Care/advanced ADL retraining, Therapeutic Activities, Therapeutic Exercise, Wheelchair propulsion/positioning, Patient/family education, Functional mobility training, DME/adaptive equipment instruction, Pain management, UE/LE Strength taining/ROM  SLP Interventions    TR Interventions    SW/CM Interventions Discharge Planning, Psychosocial Support, Patient/Family Education    Team Discharge Planning: Destination: PT-Home ,OT- Home , SLP-  Projected Follow-up: PT-Home health PT, OT-  Home health OT, SLP-  Projected Equipment Needs: PT- , OT- To be determined, SLP-  Equipment Details: PT- , OT-  Patient/family involved in discharge planning: PT- Patient,  OT-Patient, SLP-   MD ELOS: 10-14 days. Medical Rehab Prognosis:  Good Assessment: 65 y.o. 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 She was admitted on 11/29/15 for L-BKA by Dr. Sharol Given. Post op with sedation due to narcotics but has been showing good motivation with improvement in mentation. Noted to have significant acute on chronic anemia with drop in H/H to 5.9/17.7  and to be transfused with 2 units PRBC on 4/5. Dr. Sharol Given recommends  continuing Prevana wound VAC for 1 week post surgery.   See Team Conference Notes for weekly updates to the plan of care

## 2015-12-06 ENCOUNTER — Inpatient Hospital Stay (HOSPITAL_COMMUNITY): Payer: BC Managed Care – PPO | Admitting: Physical Therapy

## 2015-12-06 ENCOUNTER — Inpatient Hospital Stay (HOSPITAL_COMMUNITY): Payer: BC Managed Care – PPO

## 2015-12-06 DIAGNOSIS — R1310 Dysphagia, unspecified: Secondary | ICD-10-CM | POA: Insufficient documentation

## 2015-12-06 LAB — RENAL FUNCTION PANEL
Albumin: 2.3 g/dL — ABNORMAL LOW (ref 3.5–5.0)
Anion gap: 14 (ref 5–15)
BUN: 41 mg/dL — ABNORMAL HIGH (ref 6–20)
CO2: 26 mmol/L (ref 22–32)
Calcium: 8.7 mg/dL — ABNORMAL LOW (ref 8.9–10.3)
Chloride: 87 mmol/L — ABNORMAL LOW (ref 101–111)
Creatinine, Ser: 7.01 mg/dL — ABNORMAL HIGH (ref 0.44–1.00)
GFR calc Af Amer: 6 mL/min — ABNORMAL LOW (ref >60)
GFR calc non Af Amer: 6 mL/min — ABNORMAL LOW (ref >60)
Glucose, Bld: 219 mg/dL — ABNORMAL HIGH (ref 65–99)
Phosphorus: 3.2 mg/dL (ref 2.5–4.6)
Potassium: 3.9 mmol/L (ref 3.5–5.1)
Sodium: 127 mmol/L — ABNORMAL LOW (ref 135–145)

## 2015-12-06 LAB — GLUCOSE, CAPILLARY
Glucose-Capillary: 181 mg/dL — ABNORMAL HIGH (ref 65–99)
Glucose-Capillary: 245 mg/dL — ABNORMAL HIGH (ref 65–99)
Glucose-Capillary: 128 mg/dL — ABNORMAL HIGH (ref 65–99)
Glucose-Capillary: 279 mg/dL — ABNORMAL HIGH (ref 65–99)
Glucose-Capillary: 173 mg/dL — ABNORMAL HIGH (ref 65–99)
Glucose-Capillary: 359 mg/dL — ABNORMAL HIGH (ref 65–99)

## 2015-12-06 LAB — CBC
HCT: 29.2 % — ABNORMAL LOW (ref 36.0–46.0)
Hemoglobin: 9.6 g/dL — ABNORMAL LOW (ref 12.0–15.0)
MCH: 29.9 pg (ref 26.0–34.0)
MCHC: 32.9 g/dL (ref 30.0–36.0)
MCV: 91 fL (ref 78.0–100.0)
Platelets: 472 10*3/uL — ABNORMAL HIGH (ref 150–400)
RBC: 3.21 MIL/uL — ABNORMAL LOW (ref 3.87–5.11)
RDW: 14.6 % (ref 11.5–15.5)
WBC: 12.6 10*3/uL — ABNORMAL HIGH (ref 4.0–10.5)

## 2015-12-06 MED ORDER — SODIUM CHLORIDE 0.9 % IV SOLN: 100.0000 mL | INTRAVENOUS | Status: DC | PRN

## 2015-12-06 MED ORDER — HEPARIN SODIUM (PORCINE) 1000 UNIT/ML DIALYSIS
1000.0000 [IU] | INTRAMUSCULAR | Status: DC | PRN
  Filled 2015-12-06: qty 1

## 2015-12-06 MED ORDER — HEPARIN SODIUM (PORCINE) 1000 UNIT/ML DIALYSIS: 2000.0000 [IU] | Freq: Once | INTRAMUSCULAR | Status: DC

## 2015-12-06 MED ORDER — PENTAFLUOROPROP-TETRAFLUOROETH EX AERO: 1.0000 "application " | INHALATION_SPRAY | CUTANEOUS | Status: DC | PRN

## 2015-12-06 MED ORDER — LIDOCAINE HCL (PF) 1 % IJ SOLN
5.0000 mL | INTRAMUSCULAR | Status: DC | PRN
  Filled 2015-12-06: qty 5

## 2015-12-06 MED ORDER — ALTEPLASE 2 MG IJ SOLR: 2.0000 mg | Freq: Once | INTRAMUSCULAR | Status: DC | PRN

## 2015-12-06 MED ORDER — LIDOCAINE-PRILOCAINE 2.5-2.5 % EX CREA
1.0000 "application " | TOPICAL_CREAM | CUTANEOUS | Status: DC | PRN
  Filled 2015-12-06: qty 5

## 2015-12-06 NOTE — Procedures (Signed)
  I was present at this dialysis session, have reviewed the session itself and made  appropriate changes Kelly Splinter MD [place] pager [phone]    cell [phone] 12/06/2015, 2:37 PM

## 2015-12-06 NOTE — Progress Notes (Signed)
[place] PHYSICAL MEDICINE & REHABILITATION     PROGRESS NOTE  Subjective/Complaints:  Patient sitting up in bed this morning. She slept well overnight. She had a good day in therapy yesterday. Nursing notes difficulty swallowing.  ROS: + Dysphagia. Denies CP, SOB, N/V/D.  Objective: Vital Signs: Blood pressure 139/53, pulse 75, temperature 99.3 F (37.4 C), temperature source Oral, resp. rate 17, weight 52.11 kg (114 lb 14.1 oz), SpO2 100 %. No results found.  Recent Labs  12/04/15 0829 12/05/15 0910  WBC 8.1 10.6*  HGB 5.9* 10.4*  HCT 17.7* 31.0*  PLT 359 427*    Recent Labs  12/04/15 0829 12/05/15 0910  NA 123* 132*  K 5.2* 3.7  CL 85* 89*  GLUCOSE 111* 138*  BUN 41* 27*  CREATININE 7.16* 4.36*  CALCIUM 8.0* 8.5*   CBG (last 3)   Recent Labs  12/05/15 1204 12/05/15 1651 12/05/15 2051  GLUCAP 181* 245* 187*    Wt Readings from Last 3 Encounters:  12/06/15 52.11 kg (114 lb 14.1 oz)  12/04/15 51 kg (112 lb 7 oz)  11/25/15 54.885 kg (121 lb)    Physical Exam:  BP 139/53 mmHg  Pulse 75  Temp(Src) 99.3 F (37.4 C) (Oral)  Resp 17  Wt 52.11 kg (114 lb 14.1 oz)  SpO2 100% Constitutional: She appears well-developed. She has a sickly appearance.  HENT:  Normocephalic and atraumatic.   Eyes: Conjunctivae and EOM are normal.  Cardiovascular: Normal rate and regular rhythm.No murmur heard. Respiratory: Effort normal and breath sounds normal. No respiratory distress. She has no wheezes.  GI: Soft. Bowel sounds are normal. She exhibits no distension. There is no tenderness.  Musculoskeletal: She exhibits tenderness. She exhibits no edema.  VAC on place on L-BKA amp site. R-BKA healed well.  Neurological: She is alert and oriented.  Motor: Bilateral upper extremities: 4+/5 proximal to distal Right lower extremity: BKA, 4+/5 hip flexion  Left lower extremity: Hip flexion 4+/5 (pain inhibition)  Skin: Skin is warm and dry.  VAC on left BKA, suctioning  without leaks.  Psychiatric: She has a normal mood and affect. Her behavior is normal. Thought content normal.    Assessment/Plan: 1. Functional deficits secondary to to left BKA, with previous right BKA which require 3+ hours per day of interdisciplinary therapy in a comprehensive inpatient rehab setting. Physiatrist is providing close team supervision and 24 hour management of active medical problems listed below. Physiatrist and rehab team continue to assess barriers to discharge/monitor patient progress toward functional and medical goals.  Function:  Bathing Bathing position   Position: Wheelchair/chair at sink  Bathing parts Body parts bathed by patient: Right arm, Left arm, Chest, Abdomen    Bathing assist        Upper Body Dressing/Undressing Upper body dressing   What is the patient wearing?: Bra, Pull over shirt/dress Bra - Perfomed by patient: Thread/unthread right bra strap, Hook/unhook bra (pull down sports bra) Bra - Perfomed by helper: Thread/unthread left bra strap Pull over shirt/dress - Perfomed by patient: Thread/unthread left sleeve, Thread/unthread right sleeve, Put head through opening Pull over shirt/dress - Perfomed by helper: Pull shirt over trunk        Upper body assist Assist Level: Touching or steadying assistance(Pt > 75%)      Lower Body Dressing/Undressing Lower body dressing Lower body dressing/undressing activity did not occur: Refused  Lower body assist        Toileting Toileting     Toileting steps completed by helper: Adjust clothing prior to toileting, Performs perineal hygiene, Adjust clothing after toileting    Toileting assist Assist level:  (Max Assist)   Transfers Chair/bed transfer   Chair/bed transfer method: Squat pivot, Stand pivot Chair/bed transfer assist level: Moderate assist (Pt 50 - 74%/lift or lower) Chair/bed transfer assistive device: Environmental health practitioner Ambulation activity did not occur: Safety/medical concerns         Wheelchair   Type: Manual Max wheelchair distance: 135ft Assist Level: Supervision or verbal cues  Cognition Comprehension Comprehension assist level: Follows basic conversation/direction with extra time/assistive device  Expression Expression assist level: Expresses basic needs/ideas: With no assist  Social Interaction Social Interaction assist level: Interacts appropriately with others with medication or extra time (anti-anxiety, antidepressant).  Problem Solving Problem solving assist level: Solves basic 90% of the time/requires cueing < 10% of the time  Memory Memory assist level: Recognizes or recalls 90% of the time/requires cueing < 10% of the time    Medical Problem List and Plan: 1. Abnormality of gait, safety, deficits with transfers secondary to left BKA, with previous right BKA.  Continue CIR 2. DVT Prophylaxis/Anticoagulation: Pharmaceutical: Lovenox 3. Pain Management: Hydrocodone prn for pain, low dose robaxin to help with muscle spasms.  4. Mood: Very motivated to get better and resume independent living. LCSW to follow for evaluation and support.  5. Neuropsych: This patient is capable of making decisions on her own behalf. 6. Skin/Wound Care: Routine pressure relief measures.   7. Fluids/Electrolytes/Nutrition: Labs being monitored ---hyponateremia/hyperkalemia being managed by HD, appreciate recs.  8. ESRD: On renal diet with 1200 FR. Hemodialysis after therapy sessions on MWF to help with tolerance of therapy.  9. DMT2: Monitor BS ac/hs.  Will remove diabetic retrictions to help with food choices/intake. Use SSI for elevated BS.   Continue to hold glipizide 5 mg due to low BS and poor intake.  Fasting CBG pending this AM.  Will continue to monitor and restart when appropriate 10 HTN: Monitor BID. Has been low due to anemia and deconditioning. Monitor for now.   11. Anemia of chronic disease: Continue Aransep weekly.  12. Metabolic bone disease: On renvela.  13. Hypoxia: Improving. Encourage IS.  14. Dysphagia  Patient was to get workup prior to admission  Will order SLP for swallowing  LOS (Days) 2 A FACE TO FACE EVALUATION WAS PERFORMED  Carolyn Valenzuela Carolyn Valenzuela 12/06/2015 9:37 AM

## 2015-12-06 NOTE — Progress Notes (Signed)
Physical Therapy Session Note  Patient Details  Name: Carolyn Valenzuela MRN: [id number] Date of Birth: 05/12/1951  Today's Date: 12/06/2015 PT Individual Time: 1003-1058 And 1302-1400 PT Individual Time Calculation (min): 55 min 58 minutes   Short Term Goals: Week 1:  PT Short Term Goal 1 (Week 1): bed mobility without cues or Assistance.  PT Short Term Goal 2 (Week 1): Bed chair transfer with Min A with sqaut pivot.  PT Short Term Goal 3 (Week 1): Sit to stand with RW with min A.  PT Short Term Goal 4 (Week 1): gait with RW for 68f with min A.   Skilled Therapeutic Interventions/Progress Updates:    Session 1.  Patient received sitting in WC and agreeable to PT. PT [facility] for 1543fwith BUE propulsion with one rest break at 12585fPT assisted patient in donning and Doffing Prosthetic limb x 2 to apply various ply amputee socks for improved fit of Prosthesis. Patient performed gait training at edge of mat table for 3 forward steps and 3 lateral steps with mod A from PT. PT insturcted patient in gait training in parallel bars for 4f4fth mod A and constant verbal instruction for step pattern and Proper UE support. Patinet was unable to perform backwards gait in Parallel bars or with RW due to decreased UE strength. Patient performed supine therex for BLE: SLR x 12, SAQ x 12, glute setsx 10. R LE bridge x 10. Patient performed Squat pivot transfer x 8 throughout treatment to various height with Mod A from PT and moderate verbal and visual instruction. Patient left sitting in recliner with call bell within reach and all other needs met.    Session 2:  Patient received in Recliner and agreeable to PT. PT instructed in Squat pivot transfer with mod A to WC. Patient performed WC mobility to rehab gym for [id number] rest break. Squat pivot transfer x 5 with min-mod A and min cueing for UE positioning. Patiet performed UE therex for Low row x 12 with level 2 tband, shoulder  extension x 12 with level 2 tband, Elbow extension x 12 with level 2 tband, Chest/shoulder press with 2lb weight x 12 with BUE. Patient instructed in gait training with RW for 3 steps, mod A and constant verbal instruction from PT. PT returned patient to room in [facility]ft patient sitting in [facility] call bell within reach and all other needs met.    Therapy Documentation Precautions:  Precautions Precautions: Fall Precaution Comments: wound vac Required Braces or Orthoses: Other Brace/Splint Other Brace/Splint: has prosthetic for RLE Restrictions Weight Bearing Restrictions: Yes LLE Weight Bearing: Non weight bearing Other Position/Activity Restrictions: LLE NWB Pain: Pain Assessment Pain Score: 5   See Function Navigator for Current Functional Status.   Therapy/Group: Individual Therapy  AustiLorie Phenix2017, 12:44 PM

## 2015-12-06 NOTE — Progress Notes (Signed)
Occupational Therapy Session Note  Patient Details  Name: Carolyn Valenzuela MRN: [id number] Date of Birth: 01-03-1951  Today's Date: 12/06/2015 OT Individual Time: [id number] Minutes   Short Term Goals: Week 1:  OT Short Term Goal 1 (Week 1): Complete toileting with min assist OT Short Term Goal 2 (Week 1): Complete toilet transfer with setup and supervision OT Short Term Goal 3 (Week 1): Demo ability to retreieve and prepare simple meal at w/c level OT Short Term Goal 4 (Week 1): Complete lower body bathing with min assist OT Short Term Goal 5 (Week 1): Doff and don pants seated using lateral leans and AE, prn  Skilled Therapeutic Interventions/Progress Updates: ADL-retraining at bed level and EOB with focus on lower body bathing skills using adapted bathing/dressing skills, transfers, and seated grooming.   Pt offered shower but declined activity d/t report of increased weakness; pt requested bed level BADL session.   After setup to provide supplies and min cues to sequence, pt performed lower body bathing at bed level, using a combination of lateral leans and bed rolling (to left and right) to clean peri-area and buttocks.   Pt required min assist to lace underwear and pants through wound vac after brief disconnection of wound vac facilitated by RN tech demonstrating process.   Pt then bathed and dressed upper body sitting at edge of bed.   After OT assist to problem-solve, pt donned RLE prosthesis with steadying assist as she stood to bear weight through RLE to engage locking mechanism.   Pt completed squat-pivot transfer to w/c and groomed at sink with setup to provide supplies.   Pt left in w/c at end of session, still grooming with call light placed within reach.    Therapy Documentation Precautions:  Precautions Precautions: Fall Precaution Comments: wound vac Required Braces or Orthoses: Other Brace/Splint Other Brace/Splint: has prosthetic for RLE Restrictions Weight Bearing  Restrictions: Yes LLE Weight Bearing: Non weight bearing Other Position/Activity Restrictions: LLE NWB  Vital Signs: Therapy Vitals Temp: 99.3 F (37.4 C) Temp Source: Oral Pulse Rate: 75 Resp: 17 BP: (!) 139/53 mmHg Patient Position (if appropriate): Lying Oxygen Therapy SpO2: 100 % O2 Device: Not Delivered   Pain: No/denies pain   ADL: ADL ADL Comments: see Functional Assessment Tool  See Function Navigator for Current Functional Status.   Therapy/Group: Individual Therapy  [place] 12/06/2015, 6:50 AM

## 2015-12-07 ENCOUNTER — Inpatient Hospital Stay (HOSPITAL_COMMUNITY): Payer: BC Managed Care – PPO | Admitting: Occupational Therapy

## 2015-12-07 ENCOUNTER — Inpatient Hospital Stay (HOSPITAL_COMMUNITY): Payer: BC Managed Care – PPO | Admitting: Physical Therapy

## 2015-12-07 DIAGNOSIS — Z89519 Acquired absence of unspecified leg below knee: Secondary | ICD-10-CM

## 2015-12-07 DIAGNOSIS — D72829 Elevated white blood cell count, unspecified: Secondary | ICD-10-CM | POA: Diagnosis present

## 2015-12-07 DIAGNOSIS — E1159 Type 2 diabetes mellitus with other circulatory complications: Secondary | ICD-10-CM

## 2015-12-07 DIAGNOSIS — I96 Gangrene, not elsewhere classified: Secondary | ICD-10-CM

## 2015-12-07 DIAGNOSIS — I798 Other disorders of arteries, arterioles and capillaries in diseases classified elsewhere: Secondary | ICD-10-CM

## 2015-12-07 DIAGNOSIS — R339 Retention of urine, unspecified: Secondary | ICD-10-CM

## 2015-12-07 LAB — GLUCOSE, CAPILLARY
Glucose-Capillary: 119 mg/dL — ABNORMAL HIGH (ref 65–99)
Glucose-Capillary: 221 mg/dL — ABNORMAL HIGH (ref 65–99)
Glucose-Capillary: 209 mg/dL — ABNORMAL HIGH (ref 65–99)
Glucose-Capillary: 219 mg/dL — ABNORMAL HIGH (ref 65–99)
Glucose-Capillary: 182 mg/dL — ABNORMAL HIGH (ref 65–99)

## 2015-12-07 LAB — MRSA PCR SCREENING: MRSA by PCR: NEGATIVE

## 2015-12-07 MED ORDER — GLIPIZIDE 5 MG PO TABS
5.0000 mg | ORAL_TABLET | Freq: Every day | ORAL | Status: DC
  Administered 2015-12-08 – 2015-12-11 (×4): 5 mg via ORAL
  Filled 2015-12-07 (×4): qty 1

## 2015-12-07 NOTE — Progress Notes (Signed)
Physical Therapy Session Note  Patient Details  Name: Carolyn Valenzuela MRN: [id number] Date of Birth: 01/08/1951  Today's Date: 12/07/2015 PT Individual Time: 5868-2574 AND 1517 -9355 PT Individual Time Calculation (min): 58 min and 58 min   Short Term Goals: Week 1:  PT Short Term Goal 1 (Week 1): bed mobility without cues or Assistance.  PT Short Term Goal 2 (Week 1): Bed chair transfer with Min A with sqaut pivot.  PT Short Term Goal 3 (Week 1): Sit to stand with RW with min A.  PT Short Term Goal 4 (Week 1): gait with RW for 46f with min A.   Skilled Therapeutic Interventions/Progress Updates:    Session 1.  Patient received supine in bed and agreeable to PT. Bed mobility for supine to sit with Supervision A and moderate use of Bed rails. Transfer  To WC using squat pivot with minA From PT, min moderate verbal instruction for management of breaks and removal of armrest. WC mobility for 150 with 1 rest break and supervision A from PT with cues for increased amplitude with wheel pushes to prevent UE fatigue. PT fit patient for smaller WC (16x16) for improved use of UE propulsion. Gait training in Parallel bars, 171f 2 min A, Mod with turns and constant verbal instruction for proper use of UE and gait pattern. Gait in parallel bars with RW, 1054f 2, min A, Mod A with turn and retro-gait to sit in WC. [facility] with call bell and all other needs met.   Session 2.  Patient received supine in bed and agreeable to PT. Bed mobility from flat position with set up from PT for improved success. Patient performed Squat pivot transfer with min A From PT with mod cueing for WC parts management. WC mobility in hall to rehab gym for 150f40fth Supervision A fromPT. Squat pivot transfer with mod A from PT to Arm bike x 10 minute with 2 minute warm up and 2 min cool down cool down. Sqaut pivot transfer to WC from arm bike with min A from PT. Patient was instructed and explain to  PT proper squat pivot technique and all safety concerns proper to next transfer to mat; Patient was able to successfully explain transfer and WC parts management with only min cueing from PT. Sitting balance with ball tap using 2lb rod 2x 3 minutes. Gait training in Parallel bars for 10ft92fh min A and min A for turn to sit at end of gait, moderate verbal instruction for UE placement for turn prior to sit. Patient performed WC mobility to room and left with call bell within reach.    Therapy Documentation Precautions:  Precautions Precautions: Fall Precaution Comments: wound vac Required Braces or Orthoses: Other Brace/Splint Other Brace/Splint: has prosthetic for RLE Restrictions Weight Bearing Restrictions: Yes LLE Weight Bearing: Non weight bearing Other Position/Activity Restrictions: LLE NWB Vital Signs: Therapy Vitals Temp: 99.2 F (37.3 C) Temp Source: Oral Pulse Rate: 78 Resp: 17 BP: (!) 123/51 mmHg Patient Position (if appropriate): Sitting Oxygen Therapy SpO2: 96 % O2 Device: Not Delivered Pain: Pain Assessment Pain Assessment: No/denies pain   See Function Navigator for Current Functional Status.   Therapy/Group: Individual Therapy  AustiLorie Phenix2017, 10:18 AM

## 2015-12-07 NOTE — Progress Notes (Signed)
Subjective: tolerated 2kg off w HD yest, no complaitns today, looks better  Objective Vital signs in last 24 hours: Filed Vitals:   12/06/15 1800 12/06/15 1825 12/07/15 0620 12/07/15 1133  BP: 97/64 103/64 123/51   Pulse: 83 84 78   Temp:  98 F (36.7 C) 99.2 F (37.3 C)   TempSrc:  Oral Oral   Resp: 20 20 17    Weight:  51.3 kg (113 lb 1.5 oz)  49.6 kg (109 lb 5.6 oz)  SpO2:  99% 96%    Weight change: 2.699 kg (5 lb 15.2 oz)   Physical Exam: General: Thin,chronically ill AAF NAD/ OX3 on HD Lungs: CTA unlabored. Heart: RRR No murmurs, rubs, or gallops appreciated. Abdomen: Soft, non-tender, non-distended . Lower extremities: L Transtibial Amp. Site with wound vac / R BKA . [name] Dialysis Access: LUA AVF patent on hd   3/31 CXR clear 4/4 CXR no active disease  Dialysis: [facility] MWF  4h  52kg preBKA  3K/2.25 bath  P4  LUA AVF  Hep 2500 Mircera: 50 mcg IV q 2 weeks (11/13/15) Hectoral: 4 mcg IV Q TTS (last dose 11/25/15)  Problems: 1. Gangrene L foot sp L BKA 3/31 - +wound vac, on rehab unit 2. ESRD - [facility] MWF HD 3. HTN - BP's soft, holding BP meds 4. Volume - is down under prior dry wt which is expected after BKA 5. Anemiad/t CKD/ABL - Hb 5.9 yest, sp 2u prbcs; and esa ^'d 50 > 100 ug/ wk 6. Metabolic bone disease - phos 8.1>6.7 cont binder/ VDRA 7. Nutrition - Last in-center Albumin 3.5.>2.4 . Renal carb mod diet, renal vit and add nepro  8. DM: per primary   Plan -  Next HD Monday  Kelly Splinter MD [place] pager [phone]    cell [phone] 12/07/2015, 11:36 AM    Labs: Basic Metabolic Panel:  Recent Labs Lab 12/04/15 0829 12/05/15 0910 12/06/15 1512  NA 123* 132* 127*  K 5.2* 3.7 3.9  CL 85* 89* 87*  CO2 23 26 26   GLUCOSE 111* 138* 219*  BUN 41* 27* 41*  CREATININE 7.16* 4.36* 7.01*  CALCIUM 8.0* 8.5* 8.7*  PHOS 6.6* 4.5 3.2   Liver Function Tests:  Recent Labs Lab 12/04/15 0829 12/05/15 0910 12/06/15 1512  ALBUMIN 2.1*  2.5* 2.3*   No results for input(s): LIPASE, AMYLASE in the last 168 hours. No results for input(s): AMMONIA in the last 168 hours. CBC:  Recent Labs Lab 12/02/15 0518 12/04/15 0829 12/05/15 0910 12/06/15 1512  WBC 9.9 8.1 10.6* 12.6*  HGB 7.2* 5.9* 10.4* 9.6*  HCT 22.5* 17.7* 31.0* 29.2*  MCV 92.6 91.7 91.7 91.0  PLT 381 359 427* 472*   Cardiac Enzymes: No results for input(s): CKTOTAL, CKMB, CKMBINDEX, TROPONINI in the last 168 hours. CBG:  Recent Labs Lab 12/06/15 0645 12/06/15 1149 12/06/15 1853 12/06/15 2056 12/07/15 0700  GLUCAP 128* 279* 173* 359* 119*    Studies/Results: No results found. Medications:   . [START ON 12/11/2015] darbepoetin (ARANESP) injection - DIALYSIS  100 mcg Intravenous Q Wed-HD  . famotidine  10 mg Oral Daily  . feeding supplement (NEPRO CARB STEADY)  237 mL Oral TID WC  . gabapentin  100 mg Oral BID  . [START ON 12/08/2015] glipiZIDE  5 mg Oral QAC breakfast  . heparin subcutaneous  5,000 Units Subcutaneous 3 times per day  . insulin aspart  0-10 Units Subcutaneous TID WC & HS  . senna-docusate  1 tablet Oral  QHS  . sevelamer carbonate  1,600 mg Oral TID WC  . simvastatin  20 mg Oral q1800  . sucralfate  1 g Oral TID WC & HS

## 2015-12-07 NOTE — Progress Notes (Signed)
Occupational Therapy Session Note  Patient Details  Name: Carolyn Valenzuela MRN: [id number] Date of Birth: Oct 01, 1950  Today's Date: 12/07/2015 OT Individual Time: 1430-1452 OT Individual Time Calculation (min): 22 min    Short Term Goals: Week 1:  OT Short Term Goal 1 (Week 1): Complete toileting with min assist OT Short Term Goal 2 (Week 1): Complete toilet transfer with setup and supervision OT Short Term Goal 3 (Week 1): Demo ability to retreieve and prepare simple meal at w/c level OT Short Term Goal 4 (Week 1): Complete lower body bathing with min assist OT Short Term Goal 5 (Week 1): Doff and don pants seated using lateral leans and AE, prn  Skilled Therapeutic Interventions/Progress Updates: patient c/o extreme fatigue after dialysis last night but concurred to ocmplete exercises sitting up in bed.  She missed approx 8 minutes of the session as she complained of more fatigue and needed to rest before next therapy session to follow.     Therapy Documentation Precautions:  Precautions Precautions: Fall Precaution Comments: wound vac Required Braces or Orthoses: Other Brace/Splint Other Brace/Splint: has prosthetic for RLE Restrictions Weight Bearing Restrictions: Yes LLE Weight Bearing: Non weight bearing Other Position/Activity Restrictions: LLE NWB  Pain: denied   See Function Navigator for Current Functional Status.   Therapy/Group: Individual Therapy  Alfredia Ferguson [facility] 12/07/2015, 4:20 PM

## 2015-12-07 NOTE — Progress Notes (Signed)
Occupational Therapy Session Note  Patient Details  Name: Carolyn Valenzuela MRN: [id number] Date of Birth: 04-01-1951  Today's Date: 12/07/2015 OT Individual Time: [id number] OT Individual Time Calculation (min): 60 min    Short Term Goals: Week 1:  OT Short Term Goal 1 (Week 1): Complete toileting with min assist OT Short Term Goal 2 (Week 1): Complete toilet transfer with setup and supervision OT Short Term Goal 3 (Week 1): Demo ability to retreieve and prepare simple meal at w/c level OT Short Term Goal 4 (Week 1): Complete lower body bathing with min assist OT Short Term Goal 5 (Week 1): Doff and don pants seated using lateral leans and AE, prn  Skilled Therapeutic Interventions/Progress Updates: patient c/o fatigue from dialysis yesterday evening but concurred to complete bathing and dressing edge of bed.  Focus was on endurance and lateral leans for peri and buttocks cleansing as well as donnning her prosthesis.   She required mod A for pushing and 'locking' the prosthesis into place completely.  She appeared to lack hand strength and balance (without LE support) sitting EOB to complete the task.     Therapy Documentation Precautions:  Precautions Precautions: Fall Precaution Comments: wound vac Required Braces or Orthoses: Other Brace/Splint Other Brace/Splint: has prosthetic for RLE Restrictions Weight Bearing Restrictions: Yes LLE Weight Bearing: Non weight bearing Other Position/Activity Restrictions: LLE NWB  Pain:denied     See Function Navigator for Current Functional Status.   Therapy/Group: Individual Therapy  Alfredia Ferguson [facility] 12/07/2015, 8:23 AM

## 2015-12-07 NOTE — Progress Notes (Addendum)
[facility] PHYSICAL MEDICINE & REHABILITATION     PROGRESS NOTE  Subjective/Complaints:  Patient seen this morning sitting up in bed. She states she feels she needs to urinate and needs to "doodoo"  ROS: + Sensation of urinary retention. Denies CP, SOB, N/V/D.  Objective: Vital Signs: Blood pressure 123/51, pulse 78, temperature 99.2 F (37.3 C), temperature source Oral, resp. rate 17, weight 51.3 kg (113 lb 1.5 oz), SpO2 96 %. No results found.  Recent Labs  12/05/15 0910 12/06/15 1512  WBC 10.6* 12.6*  HGB 10.4* 9.6*  HCT 31.0* 29.2*  PLT 427* 472*    Recent Labs  12/05/15 0910 12/06/15 1512  NA 132* 127*  K 3.7 3.9  CL 89* 87*  GLUCOSE 138* 219*  BUN 27* 41*  CREATININE 4.36* 7.01*  CALCIUM 8.5* 8.7*   CBG (last 3)   Recent Labs  12/06/15 1853 12/06/15 2056 12/07/15 0700  GLUCAP 173* 359* 119*    Wt Readings from Last 3 Encounters:  12/06/15 51.3 kg (113 lb 1.5 oz)  12/04/15 51 kg (112 lb 7 oz)  11/25/15 54.885 kg (121 lb)    Physical Exam:  BP 123/51 mmHg  Pulse 78  Temp(Src) 99.2 F (37.3 C) (Oral)  Resp 17  Wt 51.3 kg (113 lb 1.5 oz)  SpO2 96% Constitutional: She appears well-developed. She has a sickly appearance.  HENT:  Normocephalic and atraumatic.   Eyes: Conjunctivae and EOM are normal.  Cardiovascular: Normal rate and regular rhythm.No murmur heard. Respiratory: Effort normal and breath sounds normal. No respiratory distress. She has no wheezes.  GI: Soft. Bowel sounds are normal. She exhibits no distension. There is no tenderness.  Musculoskeletal: She exhibits tenderness. She exhibits no edema.  VAC on place on L-BKA amp site. R-BKA healed well.  Neurological: She is alert and oriented.  Motor: Bilateral upper extremities: 4+/5 proximal to distal Right lower extremity: BKA, 5/5 hip flexion  Left lower extremity: Hip flexion 4+/5 (pain inhibition)  Skin: Skin is warm and dry.  VAC on left BKA, suctioning without leaks.   Psychiatric: She has a normal mood and affect. Her behavior is normal. Thought content normal.   Assessment/Plan: 1. Functional deficits secondary to to left BKA, with previous right BKA which require 3+ hours per day of interdisciplinary therapy in a comprehensive inpatient rehab setting. Physiatrist is providing close team supervision and 24 hour management of active medical problems listed below. Physiatrist and rehab team continue to assess barriers to discharge/monitor patient progress toward functional and medical goals.  Function:  Bathing Bathing position   Position: Bed  Bathing parts Body parts bathed by patient: Right arm, Left arm, Chest, Abdomen, Front perineal area, Buttocks, Right upper leg, Left upper leg Body parts bathed by helper: Back  Bathing assist Assist Level: Touching or steadying assistance(Pt > 75%)      Upper Body Dressing/Undressing Upper body dressing   What is the patient wearing?: Bra, Pull over shirt/dress Bra - Perfomed by patient: Thread/unthread right bra strap, Thread/unthread left bra strap, Hook/unhook bra (pull down sports bra) Bra - Perfomed by helper: Thread/unthread left bra strap Pull over shirt/dress - Perfomed by patient: Thread/unthread right sleeve, Thread/unthread left sleeve, Put head through opening, Pull shirt over trunk Pull over shirt/dress - Perfomed by helper: Pull shirt over trunk        Upper body assist Assist Level: Set up, More than reasonable time   Set up : To obtain clothing/put away  Lower Body Dressing/Undressing Lower body  dressing Lower body dressing/undressing activity did not occur: Refused What is the patient wearing?: Underwear, Pants Underwear - Performed by patient: Thread/unthread right underwear leg, Pull underwear up/down Underwear - Performed by helper: Thread/unthread left underwear leg Pants- Performed by patient: Thread/unthread right pants leg, Pull pants up/down, Fasten/unfasten pants Pants-  Performed by helper: Thread/unthread left pants leg                      Lower body assist Assist for lower body dressing: Touching or steadying assistance (Pt > 75%)      Toileting Toileting     Toileting steps completed by helper: Adjust clothing prior to toileting, Performs perineal hygiene, Adjust clothing after toileting    Toileting assist Assist level:  (Max Assist)   Transfers Chair/bed transfer   Chair/bed transfer method: Squat pivot Chair/bed transfer assist level: Moderate assist (Pt 50 - 74%/lift or lower) Chair/bed transfer assistive device: Armrests     Locomotion Ambulation Ambulation activity did not occur: Safety/medical concerns   Max distance: 68ft Assist level: Moderate assist (Pt 50 - 74%)   Wheelchair   Type: Manual Max wheelchair distance: 150 Assist Level: Supervision or verbal cues  Cognition Comprehension Comprehension assist level: Follows basic conversation/direction with extra time/assistive device  Expression Expression assist level: Expresses basic needs/ideas: With no assist  Social Interaction Social Interaction assist level: Interacts appropriately with others with medication or extra time (anti-anxiety, antidepressant).  Problem Solving Problem solving assist level: Solves basic 90% of the time/requires cueing < 10% of the time  Memory Memory assist level: Recognizes or recalls 90% of the time/requires cueing < 10% of the time    Medical Problem List and Plan: 1. Abnormality of gait, safety, deficits with transfers secondary to left BKA, with previous right BKA.  Continue CIR 2. DVT Prophylaxis/Anticoagulation: Pharmaceutical: Lovenox 3. Pain Management: Hydrocodone prn for pain, low dose robaxin to help with muscle spasms.  4. Mood: Very motivated to get better and resume independent living. LCSW to follow for evaluation and support.  5. Neuropsych: This patient is capable of making decisions on her own behalf. 6.  Skin/Wound Care: Routine pressure relief measures.   7. Fluids/Electrolytes/Nutrition: Labs being monitored ---hyponateremia/hyperkalemia being managed by HD, appreciate recs.  8. ESRD: On renal diet with 1200 FR. Hemodialysis after therapy sessions on MWF to help with tolerance of therapy.  9. DMT2: Monitor BS ac/hs.  Will remove diabetic retrictions to help with food choices/intake. Use SSI for elevated BS.   Will restart home glipizide 5 mg on 4/8. 10 HTN: Monitor BID. Monitor for now.  11. Anemia of chronic disease: Continue Aransep weekly.  12. Metabolic bone disease: On renvela.  13. Hypoxia: Improving. Encourage IS.  14. Dysphagia  Patient was scheduled to get workup prior to admission  Will order SLP for swallowing 15. Leukocytosis, afebrile  WBCs 12.6 on 4/7  Will order UA/U culture 16. Urinary retention  Patient For 200 mL volume  LOS (Days) 3 A FACE TO FACE EVALUATION WAS PERFORMED  Ankit Lorie Phenix 12/07/2015 10:05 AM

## 2015-12-07 NOTE — Evaluation (Signed)
Speech Language Pathology Assessment and Plan  Patient Details  Name: Carolyn Valenzuela MRN: [id number] Date of Birth: 1950-12-23  Today's Date: 12/07/2015 SLP Individual Time: 1050-1100 SLP Individual Time Calculation (min): 10 min   Problem List:  Patient Active Problem List   Diagnosis Date Noted  . Leukocytosis   . Urinary retention   . Dysphagia   . Unilateral complete BKA ([place]) 12/04/2015  . S/P bilateral BKA (below knee amputation) ([place])   . Abnormality of gait   . Muscle spasm   . ESRD on dialysis ([place])   . Type 2 diabetes mellitus with diabetic peripheral angiopathy and gangrene, without long-term current use of insulin ([place])   . Post-operative pain   . Benign essential HTN   . Metabolic bone disease   . Hypoxia   . Type 2 diabetes mellitus with peripheral neuropathy (HCC)   . Labile blood glucose   . End stage renal disease ([place])   . Postoperative pain   . Acute blood loss anemia   . Anemia of chronic disease   . Below knee amputation status ([place]) 11/29/2015  . Odynophagia 11/12/2015  . Chest pain, atypical 06/06/2015  . Chest pain 05/28/2015  . Ingrown nail 05/28/2015  . Acute pulmonary edema (HCC)   . FUO (fever of unknown origin)   . HCAP (healthcare-associated pneumonia)   . ESRD needing dialysis ([place])   . Type 2 diabetes mellitus with ESRD (end-stage renal disease) ([place]) 09/01/2014  . Anemia of renal disease 09/01/2014  . Venous insufficiency of left leg 08/27/2014  . Rash 07/06/2014  . S/P BKA (below knee amputation) ([place]) 03/20/2014  . 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 Critical lower limb ischemia 01/09/2014  . Right foot ulcer ([place]) 11/07/2013  . Toe pain 09/06/2013  . Pre-ulcerative corn or callous 09/06/2013  . Lower extremity pain 09/06/2013  . Left low back pain 12/08/2011  . Hip bursitis, left 12/08/2011  . Left leg pain 12/08/2011  . Abdominal pain, other specified site 07/08/2011  . Preventative health care 12/08/2010   . Allergic rhinitis, cause unspecified 12/08/2010  . SECONDARY HYPERPARATHYROIDISM 05/21/2009  . NUMBNESS 05/21/2009  . BACK PAIN 05/02/2009  . ANEMIA-IRON DEFICIENCY 01/25/2008  . Essential hypertension 01/25/2008  . CERVICAL RADICULOPATHY, LEFT 01/25/2008  . Hyperlipidemia 03/30/2007   Past Medical History:  Past Medical History  Diagnosis Date  . DIABETES MELLITUS, UNCONTROLLED 05/21/2009  . HYPERLIPIDEMIA 03/30/2007  . ANEMIA-IRON DEFICIENCY 01/25/2008  . HYPERTENSION 01/25/2008  . SINUSITIS- ACUTE-NOS 01/25/2008  . SECONDARY HYPERPARATHYROIDISM 05/21/2009  . CERVICAL RADICULOPATHY, LEFT 01/25/2008  . BACK PAIN 05/02/2009  . NUMBNESS 05/21/2009  . Allergic rhinitis, cause unspecified 12/08/2010  . Foot ulcer ([place])     right lateral malleolus  . Critical lower limb ischemia   . RENAL INSUFFICIENCY 02/01/2008    HD pt.   Past Surgical History:  Past Surgical History  Procedure Laterality Date  . Av fistula placement Left   . Eye surgery Bilateral     cataracts removed, left eye still has some oil in it.  . Colonoscopy    . Amputation Right 03/15/2014    Procedure: RIGHT  LEG  BELOW KNEE AMPUTATION ;  Surgeon: [name], MD;  Location: [facility];  Service: Orthopedics;  Laterality: Right;  . Insertion of dialysis catheter N/A 09/03/2014    Procedure: INSERTION OF DIALYSIS CATHETER RIGHT INTERNAL JUGULAR VEIN;  Surgeon: Conrad Aptos, MD;  Location: [facility];  Service: Vascular;  Laterality:  N/A;  . Av fistula placement Left 09/06/2014    Procedure: ARTERIOVENOUS (AV) FISTULA CREATION-Left Brachiocephalic;  Surgeon: Conrad Plato, MD;  Location: [facility];  Service: Vascular;  Laterality: Left;  . Peripheral vascular catheterization N/A 08/13/2015    Procedure: Abdominal Aortogram;  Surgeon: Elam Dutch, MD;  Location: [facility];  Service: Cardiovascular;  Laterality: N/A;  . Amputation Left 11/29/2015    Procedure: AMPUTATION BELOW KNEE;  Surgeon: Newt Minion, MD;  Location: [facility];   Service: Orthopedics;  Laterality: Left;    Assessment / Plan / Recommendation Clinical Impression   Carolyn Valenzuela is a 65 y.o. 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 She was admitted on 11/29/15 for L-BKA by Dr. Sharol Given. Pt admitted to CIR on 12/04/2015.  Bedside swallowing evaluation ordered due to RN reports of pt experiencing difficulty with swallowing.  Evaluation completed on 12/07/2015 with the following results:  Pt presents with grossly intact oropharyngal swallowing function.  No overt s/s of aspiration evident with solids or liquids, despite being challenged with large consecutive sips of liquids via straw.  Mastication of regular textures WNL with complete clearance of residuals from the oral cavity post swallow without difficulty.  Pt and RN report that at times pt has difficulty swallowing larger pills which improves when pills are cut in half.  Recommend that pt remain on a regular diet with thin liquids, meds whole with liquids.  RN may cut larger pills in half unless contraindicated.  Purees such as applesauce and pudding may also be utilized to facilitate clearance of meds to prevent sensation of pills "getting stuck."   No further ST needs at this time.    Skilled Therapeutic Interventions          Bedside swallowing evaluation completed with results and recommendations reviewed with patient.     SLP Assessment  Patient does not need any further Speech Lanaguage Pathology Services    Recommendations  SLP Diet Recommendations: Age appropriate regular solids;Thin Liquid Administration via: Cup;Straw Medication Administration: Whole meds with liquid (cut larger pills in half ) Supervision: Patient able to self feed Follow up Recommendations: None Equipment Recommended: None recommended by SLP            Pain Pain Assessment Pain Assessment: No/denies pain  Prior Functioning Type of Home: Apartment  Lives With: Alone Available Help at Discharge: Family;Available PRN/intermittently Vocation: Retired  Function:  Eating Eating   Modified Consistency Diet: No Eating Assist Level: More than reasonable amount of time           Cognition Comprehension Comprehension assist level: Follows basic conversation/direction with extra time/assistive device  Expression   Expression assist level: Expresses basic needs/ideas: With no assist  Social Interaction Social Interaction assist level: Interacts appropriately with others with medication or extra time (anti-anxiety, antidepressant).  Problem Solving Problem solving assist level: Solves basic 90% of the time/requires cueing < 10% of the time  Memory Memory assist level: Recognizes or recalls 90% of the time/requires cueing < 10% of the time    Refer to Care Plan for Long Term Goals  Recommendations for other services: None  Discharge Criteria: Patient will be discharged from SLP if patient refuses treatment 3 consecutive times without medical reason, if treatment goals not met, if there is a change in medical status,  if patient makes no progress towards goals or if patient is discharged from hospital.  The above assessment, treatment plan, treatment alternatives and goals were discussed and mutually agreed upon: by patient  Emilio Math 12/07/2015, 11:15 AM

## 2015-12-08 LAB — GLUCOSE, CAPILLARY
Glucose-Capillary: 156 mg/dL — ABNORMAL HIGH (ref 65–99)
Glucose-Capillary: 119 mg/dL — ABNORMAL HIGH (ref 65–99)
Glucose-Capillary: 185 mg/dL — ABNORMAL HIGH (ref 65–99)
Glucose-Capillary: 119 mg/dL — ABNORMAL HIGH (ref 65–99)

## 2015-12-08 NOTE — Plan of Care (Signed)
Problem: RH BOWEL ELIMINATION Goal: RH STG MANAGE BOWEL W/MEDICATION W/ASSISTANCE STG Manage Bowel with Medication with min Assistance.  Outcome: Not Progressing Required disimpaction by RN last HS  Problem: RH SKIN INTEGRITY Goal: RH STG ABLE TO PERFORM INCISION/WOUND CARE W/ASSISTANCE STG Able To Perform Incision/Wound Care With min Assistance.  Outcome: Not Progressing Total assist at this time with wound vac

## 2015-12-08 NOTE — Progress Notes (Signed)
[facility] PHYSICAL MEDICINE & REHABILITATION     PROGRESS NOTE  Subjective/Complaints:  Patient sitting up in bed this morning. She states she feels better. She does not feel like her bladder is full either.  ROS:  Denies CP, SOB, N/V/D.  Objective: Vital Signs: Blood pressure 165/53, pulse 74, temperature 99.1 F (37.3 C), temperature source Oral, resp. rate 16, weight 50.7 kg (111 lb 12.4 oz), SpO2 97 %. No results found.  Recent Labs  12/06/15 1512  WBC 12.6*  HGB 9.6*  HCT 29.2*  PLT 472*    Recent Labs  12/06/15 1512  NA 127*  K 3.9  CL 87*  GLUCOSE 219*  BUN 41*  CREATININE 7.01*  CALCIUM 8.7*   CBG (last 3)   Recent Labs  12/07/15 2035 12/08/15 0635 12/08/15 1142  GLUCAP 182* 156* 119*    Wt Readings from Last 3 Encounters:  12/08/15 50.7 kg (111 lb 12.4 oz)  12/04/15 51 kg (112 lb 7 oz)  11/25/15 54.885 kg (121 lb)    Physical Exam:  BP 165/53 mmHg  Pulse 74  Temp(Src) 99.1 F (37.3 C) (Oral)  Resp 16  Wt 50.7 kg (111 lb 12.4 oz)  SpO2 97% Constitutional: She appears well-developed. She has a sickly appearance.  HENT:  Normocephalic and atraumatic.   Eyes: Conjunctivae and EOM are normal.  Cardiovascular: Normal rate and regular rhythm.No murmur heard. Respiratory: Effort normal and breath sounds normal. No respiratory distress. She has no wheezes.  GI: Soft. Bowel sounds are normal. She exhibits no distension. There is no tenderness.  Musculoskeletal: She exhibits tenderness. She exhibits no edema.  VAC on place on L-BKA amp site. R-BKA healed well.  Neurological: She is alert and oriented.  Motor: Bilateral upper extremities: 4+/5 proximal to distal Right lower extremity: BKA, 5/5 hip flexion  Left lower extremity: Hip flexion 4+/5 (pain inhibition)  Skin: Skin is warm and dry.  VAC on left BKA, suctioning without leaks.  Psychiatric: She has a normal mood and affect. Her behavior is normal. Thought content normal.    Assessment/Plan: 1. Functional deficits secondary to to left BKA, with previous right BKA which require 3+ hours per day of interdisciplinary therapy in a comprehensive inpatient rehab setting. Physiatrist is providing close team supervision and 24 hour management of active medical problems listed below. Physiatrist and rehab team continue to assess barriers to discharge/monitor patient progress toward functional and medical goals.  Function:  Bathing Bathing position   Position: Sitting EOB  Bathing parts Body parts bathed by patient: Right arm, Chest, Left arm, Abdomen, Front perineal area, Buttocks, Right upper leg, Left upper leg Body parts bathed by helper: Back  Bathing assist Assist Level: Touching or steadying assistance(Pt > 75%)      Upper Body Dressing/Undressing Upper body dressing   What is the patient wearing?: Pull over shirt/dress Bra - Perfomed by patient: Thread/unthread right bra strap, Thread/unthread left bra strap, Hook/unhook bra (pull down sports bra) Bra - Perfomed by helper: Thread/unthread left bra strap Pull over shirt/dress - Perfomed by patient: Thread/unthread right sleeve, Thread/unthread left sleeve, Put head through opening, Pull shirt over trunk Pull over shirt/dress - Perfomed by helper: Pull shirt over trunk        Upper body assist Assist Level: Set up   Set up : To obtain clothing/put away  Lower Body Dressing/Undressing Lower body dressing Lower body dressing/undressing activity did not occur: Refused What is the patient wearing?: Underwear, Pants Underwear - Performed by patient:  Thread/unthread right underwear leg, Pull underwear up/down Underwear - Performed by helper: Thread/unthread left underwear leg Pants- Performed by patient: Pull pants up/down, Thread/unthread right pants leg Pants- Performed by helper: Thread/unthread left pants leg                      Lower body assist Assist for lower body dressing: Touching or  steadying assistance (Pt > 75%)      Toileting Toileting Toileting activity did not occur: N/A   Toileting steps completed by helper: Adjust clothing prior to toileting, Performs perineal hygiene, Adjust clothing after toileting    Toileting assist Assist level:  (Max Assist)   Transfers Chair/bed transfer   Chair/bed transfer method: Squat pivot Chair/bed transfer assist level: Moderate assist (Pt 50 - 74%/lift or lower) Chair/bed transfer assistive device: Prosthesis     Locomotion Ambulation Ambulation activity did not occur: Safety/medical concerns   Max distance: 97ft Assist level: Touching or steadying assistance (Pt > 75%)   Wheelchair   Type: Manual Max wheelchair distance: 150 Assist Level: Supervision or verbal cues  Cognition Comprehension Comprehension assist level: Follows basic conversation/direction with no assist  Expression Expression assist level: Expresses basic needs/ideas: With no assist  Social Interaction Social Interaction assist level: Interacts appropriately with others with medication or extra time (anti-anxiety, antidepressant).  Problem Solving Problem solving assist level: Solves basic problems with no assist  Memory Memory assist level: Recognizes or recalls 90% of the time/requires cueing < 10% of the time    Medical Problem List and Plan: 1. Abnormality of gait, safety, deficits with transfers secondary to left BKA, with previous right BKA.  Continue CIR 2. DVT Prophylaxis/Anticoagulation: Pharmaceutical: Lovenox 3. Pain Management: Hydrocodone prn for pain, low dose robaxin to help with muscle spasms.  4. Mood: Very motivated to get better and resume independent living. LCSW to follow for evaluation and support.  5. Neuropsych: This patient is capable of making decisions on her own behalf. 6. Skin/Wound Care: Routine pressure relief measures.   7. Fluids/Electrolytes/Nutrition: Labs being monitored ---hyponateremia/hyperkalemia being  managed by HD, appreciate recs.  8. ESRD: On renal diet with 1200 FR. Hemodialysis after therapy sessions on MWF to help with tolerance of therapy.  9. DMT2: Monitor BS ac/hs.  Will remove diabetic retrictions to help with food choices/intake. Use SSI for elevated BS.   Restarted home glipizide 5 mg on 4/8, will continue to monitor and consider further increase tomorrow. 10 HTN: Monitor BID. Monitor for now.  11. Anemia of chronic disease: Continue Aransep weekly.  12. Metabolic bone disease: On renvela.  13. Hypoxia: Improving. Encourage IS.  14. Dysphagia  Patient was scheduled to get workup prior to admission  Will order SLP for swallowing 15. Leukocytosis, afebrile  WBCs 12.6 on 4/7  Will order UA/U culture 16. Urinary retention Patient has not enlargement of bladder since being cathed Yesterday  LOS (Days) 4 A FACE TO FACE EVALUATION WAS PERFORMED  Ankit Lorie Phenix 12/08/2015 2:54 PM

## 2015-12-09 ENCOUNTER — Inpatient Hospital Stay (HOSPITAL_COMMUNITY): Payer: BC Managed Care – PPO | Admitting: Physical Therapy

## 2015-12-09 ENCOUNTER — Inpatient Hospital Stay (HOSPITAL_COMMUNITY): Payer: BC Managed Care – PPO | Admitting: Occupational Therapy

## 2015-12-09 LAB — RENAL FUNCTION PANEL
Albumin: 2.2 g/dL — ABNORMAL LOW (ref 3.5–5.0)
Anion gap: 12 (ref 5–15)
BUN: 27 mg/dL — ABNORMAL HIGH (ref 6–20)
CO2: 26 mmol/L (ref 22–32)
Calcium: 8.4 mg/dL — ABNORMAL LOW (ref 8.9–10.3)
Chloride: 91 mmol/L — ABNORMAL LOW (ref 101–111)
Creatinine, Ser: 6.08 mg/dL — ABNORMAL HIGH (ref 0.44–1.00)
GFR calc Af Amer: 8 mL/min — ABNORMAL LOW (ref >60)
GFR calc non Af Amer: 7 mL/min — ABNORMAL LOW (ref >60)
Glucose, Bld: 91 mg/dL (ref 65–99)
Phosphorus: 1.7 mg/dL — ABNORMAL LOW (ref 2.5–4.6)
Potassium: 3.5 mmol/L (ref 3.5–5.1)
Sodium: 129 mmol/L — ABNORMAL LOW (ref 135–145)

## 2015-12-09 LAB — GLUCOSE, CAPILLARY
Glucose-Capillary: 77 mg/dL (ref 65–99)
Glucose-Capillary: 88 mg/dL (ref 65–99)
Glucose-Capillary: 102 mg/dL — ABNORMAL HIGH (ref 65–99)
Glucose-Capillary: 126 mg/dL — ABNORMAL HIGH (ref 65–99)

## 2015-12-09 MED ORDER — HEPARIN SODIUM (PORCINE) 1000 UNIT/ML DIALYSIS
2500.0000 [IU] | Freq: Once | INTRAMUSCULAR | Status: AC
  Administered 2015-12-09: 2500 [IU] via INTRAVENOUS_CENTRAL

## 2015-12-09 MED ORDER — ALTEPLASE 2 MG IJ SOLR: 2.0000 mg | Freq: Once | INTRAMUSCULAR | Status: DC | PRN

## 2015-12-09 MED ORDER — PENTAFLUOROPROP-TETRAFLUOROETH EX AERO: 1.0000 "application " | INHALATION_SPRAY | CUTANEOUS | Status: DC | PRN

## 2015-12-09 MED ORDER — LIDOCAINE HCL (PF) 1 % IJ SOLN
5.0000 mL | INTRAMUSCULAR | Status: DC | PRN
  Filled 2015-12-09: qty 5

## 2015-12-09 MED ORDER — SENNOSIDES-DOCUSATE SODIUM 8.6-50 MG PO TABS
2.0000 | ORAL_TABLET | Freq: Two times a day (BID) | ORAL | Status: AC
  Administered 2015-12-09 – 2015-12-10 (×4): 2 via ORAL
  Filled 2015-12-09 (×4): qty 2

## 2015-12-09 MED ORDER — PRO-STAT SUGAR FREE PO LIQD
30.0000 mL | Freq: Three times a day (TID) | ORAL | Status: DC
  Administered 2015-12-09 – 2015-12-11 (×6): 30 mL via ORAL
  Filled 2015-12-09 (×6): qty 30

## 2015-12-09 MED ORDER — RENA-VITE PO TABS
1.0000 | ORAL_TABLET | Freq: Every day | ORAL | Status: DC
  Administered 2015-12-09 – 2015-12-19 (×11): 1 via ORAL
  Filled 2015-12-09 (×11): qty 1

## 2015-12-09 MED ORDER — SODIUM CHLORIDE 0.9 % IV SOLN: 100.0000 mL | INTRAVENOUS | Status: DC | PRN

## 2015-12-09 MED ORDER — SENNOSIDES-DOCUSATE SODIUM 8.6-50 MG PO TABS: 1.0000 | ORAL_TABLET | Freq: Two times a day (BID) | ORAL | Status: DC

## 2015-12-09 MED ORDER — HEPARIN SODIUM (PORCINE) 1000 UNIT/ML DIALYSIS: 1000.0000 [IU] | INTRAMUSCULAR | Status: DC | PRN

## 2015-12-09 MED ORDER — LIDOCAINE-PRILOCAINE 2.5-2.5 % EX CREA: 1.0000 "application " | TOPICAL_CREAM | CUTANEOUS | Status: DC | PRN

## 2015-12-09 MED ORDER — SORBITOL 70 % SOLN: 30.0000 mL | Freq: Every day | Status: DC | PRN

## 2015-12-09 MED ORDER — SENNOSIDES-DOCUSATE SODIUM 8.6-50 MG PO TABS
1.0000 | ORAL_TABLET | Freq: Two times a day (BID) | ORAL | Status: DC
  Administered 2015-12-11 (×2): 1 via ORAL
  Filled 2015-12-09 (×2): qty 1

## 2015-12-09 NOTE — Progress Notes (Signed)
Patient reports constipation--had to be disimpacted "due to hard balls"  this weekend.  Does not want any sorbitol today due to HD and does not want to repeat suppository.  Will increase senna to 2 bid today followed by 1 bid.  She has been refusing nephro as it elevates BS--will change to pro-stat to help with protein calorie malnutrition.

## 2015-12-09 NOTE — Progress Notes (Signed)
Occupational Therapy Session Note  Patient Details  Name: LEONDRA CAMARDA MRN: [id number] Date of Birth: [birth year]  Today's Date: 12/09/2015 OT Individual Time: 1133-1200 OT Individual Time Calculation (min): 27 min    Short Term Goals: Week 1:  OT Short Term Goal 1 (Week 1): Complete toileting with min assist OT Short Term Goal 2 (Week 1): Complete toilet transfer with setup and supervision OT Short Term Goal 3 (Week 1): Demo ability to retreieve and prepare simple meal at w/c level OT Short Term Goal 4 (Week 1): Complete lower body bathing with min assist OT Short Term Goal 5 (Week 1): Doff and don pants seated using lateral leans and AE, prn  Skilled Therapeutic Interventions/Progress Updates:    Treatment session with focus on BUE strengthening and transfers.  Lateral scoot transfer to therapy mat with min assist.  Engaged in BUE strengthening with 2# dowel rod with 2 sets of 10 bicep curls, chest presses, and lateral rowing motion.  Utilized theraband with chest pulls 2 sets of 10.  Returned to room via w/c with pt propelling with increased time.  Therapy Documentation Precautions:  Precautions Precautions: Fall Precaution Comments: wound vac Required Braces or Orthoses: Other Brace/Splint Other Brace/Splint: has prosthetic for RLE Restrictions Weight Bearing Restrictions: Yes LLE Weight Bearing: Non weight bearing Other Position/Activity Restrictions: LLE NWB Pain: Pain Assessment Pain Assessment: No/denies pain ADL: ADL ADL Comments: see Functional Assessment Tool  See Function Navigator for Current Functional Status.   Therapy/Group: Individual Therapy  Simonne Come 12/09/2015, 12:05 PM

## 2015-12-09 NOTE — Plan of Care (Signed)
Problem: RH Bed Mobility Goal: LTG Patient will perform bed mobility with assist (PT) LTG: Patient will perform bed mobility with assistance, with/without cues (PT).  Without hospital bed features

## 2015-12-09 NOTE — Progress Notes (Signed)
Subjective: Interval History: has no complaint.  Objective: Vital signs in last 24 hours: Temp:  [98.7 F (37.1 C)-99.1 F (37.3 C)] 98.7 F (37.1 C) (04/10 0507) Pulse Rate:  [74-76] 76 (04/10 0507) Resp:  [16-17] 17 (04/10 0507) BP: (153-165)/(53-58) 153/58 mmHg (04/10 0507) SpO2:  [97 %] 97 % (04/10 0507) Weight:  [51.1 kg (112 lb 10.5 oz)] 51.1 kg (112 lb 10.5 oz) (04/10 0507) Weight change: 1.5 kg (3 lb 4.9 oz)  Intake/Output from previous day: 04/09 0701 - 04/10 0700 In: 840 [P.O.:840] Out: 0  Intake/Output this shift: Total I/O In: 120 [P.O.:120] Out: -   General appearance: alert, cooperative and no distress Resp: clear to auscultation bilaterally Cardio: S1, S2 normal and systolic murmur: systolic ejection 2/6, decrescendo at 2nd left intercostal space GI: soft, non-tender; bowel sounds normal; no masses,  no organomegaly Extremities: RKA, new L BKA  Lab Results:  Recent Labs  12/06/15 1512  WBC 12.6*  HGB 9.6*  HCT 29.2*  PLT 472*   BMET:  Recent Labs  12/06/15 1512  NA 127*  K 3.9  CL 87*  CO2 26  GLUCOSE 219*  BUN 41*  CREATININE 7.01*  CALCIUM 8.7*   No results for input(s): PTH in the last 72 hours. Iron Studies: No results for input(s): IRON, TIBC, TRANSFERRIN, FERRITIN in the last 72 hours.  Studies/Results: No results found.  I have reviewed the patient's current medications.  Assessment/Plan: 1 ESRD for Hd 2 Anemia esa, has been lower, check Fe 3 PVD post BKA 4 HPTH vit D 5 DM controlled P HD, esa, check Fe/PTH    LOS: 5 days   Illyria Sobocinski L 12/09/2015,8:45 AM

## 2015-12-09 NOTE — Plan of Care (Signed)
Problem: RH Other (Specify) Goal: RH LTG Other (Specify)1 Pt will negotiate ramp in w/c with Mod I for home entry/exit.

## 2015-12-09 NOTE — Progress Notes (Signed)
Occupational Therapy Session Note  Patient Details  Name: Carolyn Valenzuela MRN: [id number] Date of Birth: 03/03/1951  Today's Date: 12/09/2015 OT Individual Time: 1300-1400  (60 min)    Short Term Goals: Week 1:  OT Short Term Goal 1 (Week 1): Complete toileting with min assist OT Short Term Goal 2 (Week 1): Complete toilet transfer with setup and supervision OT Short Term Goal 3 (Week 1): Demo ability to retreieve and prepare simple meal at w/c level OT Short Term Goal 4 (Week 1): Complete lower body bathing with min assist OT Short Term Goal 5 (Week 1): Doff and don pants seated using lateral leans and AE, prn      Skilled Therapeutic Interventions/Progress Updates:    Pt. Sitting in wc upon OT arrival.  Pt propelled wc to sink and cleaned teedh.  Propelled wc to ADL apt.  Prepared grill cheese sandwich.  At wc level.  Educated pt on locking wc when reaching into cabinets.  Pt took increased time with BUE hand coordination with opening packages and doing twist tie on bread.  She demonstrated decreased attention and working memory.  Needed cues to remember her sandwich was in oven other wise it would have burned.  Pt.did well with organizing and her meal prep and clean up.  Propelled wc back and passed off to P.T.      Therapy Documentation Precautions:  Precautions Precautions: Fall Precaution Comments: wound vac Required Braces or Orthoses: Other Brace/Splint Other Brace/Splint: has prosthetic for RLE Restrictions Weight Bearing Restrictions: Yes LLE Weight Bearing: Non weight bearing Other Position/Activity Restrictions: LLE NWB        Pain:  3/10 Left leg    ADL: ADL ADL Comments: see Functional Assessment Tool     See Function Navigator for Current Functional Status.   Therapy/Group: Individual Therapy  Lisa Roca 12/09/2015, 2:50 PM

## 2015-12-09 NOTE — Progress Notes (Signed)
Physical Therapy Session Note  Patient Details  Name: Carolyn Valenzuela MRN: [id number] Date of Birth: 12-29-1950  Today's Date: 12/09/2015 PT Individual Time: 0900-1015 and [id number] PT Individual Time Calculation (min): 75 min and 40 minutes   Short Term Goals: Week 1:  PT Short Term Goal 1 (Week 1): bed mobility without cues or Assistance.  PT Short Term Goal 2 (Week 1): Bed chair transfer with Min A with sqaut pivot.  PT Short Term Goal 3 (Week 1): Sit to stand with RW with min A.  PT Short Term Goal 4 (Week 1): gait with RW for 32ft with min A.   Skilled Therapeutic Interventions/Progress Updates:    Treatment 1: Pt received in bed with RN present & administering medication. Pt denied any c/o pain & agreeable to PT. Discussed with pt phantom pain & sensation explaining both to pt. Pt reports she has experienced both in LLE but that it only lasts for minutes at a time. Instructed pt to always look at BLE before trying to get out of bed/stand up to serve as reminder and to prevent pt from falling. Pt reports her pain has not been bad following surgery. PT also discussed d/c plan with pt, as pt plans to go home alone. Educated pt on strong possibility of requiring assistance at time of d/c & encouraged pt to begin talking to family/friends to see who will be available to assist pt at d/c; pt agreeable. Pt strongly expressed wanting to bathe prior to PT session. Offered for pt to bathe during OT session but pt strongly wanted to complete bathing now. Discussed with RN pt not being allowed to shower & RN cleared pt to take sponge bath. PT covered LLE with towel and maintained covering LE while pt bathed sitting at EOB. Pt able to doff old and don new clothes (bra, shirt) with Mod I for sitting balance, requiring total A for threading underwear & pants over wound vac & L LE. Pt with good weight shifts to allow her to doff/don underwear/pants without any LOB of balance noted during bathing & dressing  activities. Pt able to don RLE prosthetic with assistance from PT to ensure prosthetic locked in place. Pt completed squat pivot transfer bed>w/c with min A demonstrating proper hand placement & sequencing, as well as requiring 2 small movements to complete transfer. Pt able to manage w/c armrest with supervision but required heavy verbal & visual cuing for management of L amputee support pad as pt had difficulty aligning piece and attaching it to w/c. Pt able to self propel w/c 150 ft with BUE & supervision A reporting fatigue after task. PT shortened amputee support pad and provided pt with R leg rest. Pt utilized BUE ergometer Level 4 x 4 minutes reporting it caused BUE fatigue & required rest break. Pt then self propelled w/c 150 ft back to room with supervision & maximum cuing & moderate assistance to avoid running into object. PT stressed importance on making sure she has plenty of room between her & object, as pt reported she thought she could get around it. Once in room PT educated pt on need to call for assistance if wanting to get out of w/c or propel w/c in room because she continues to attached to would vac & would need assistance for managing device. Pt verbalized understanding & demonstrated proper use of call bell; pt left with all needs within reach.  Treatment 2: Pt received in hand off from OT; pt denied c/o pain &  agreeable to PT. PT provided extra cushion on amputee support pad to allow pt maximum knee extension. Discussed with pt power w/c versus manual w/c and requirements for power w/c. Educated pt on purpose of rehab to practice w/c mobility skills to increase her independence in manual chair. Pt disappointed about using manual w/c over power w/c but agreeable. PT also stressed importance of pt talking with family/friends regarding assistance upon d/c as pt will probably require help.  Significant time also spent educating pt on management of w/c parts, and pt continues to have difficulty  applying & removing leg rest/support pad. Pt transferred sit<>stand with mod A & cuing for hand placement. Pt able to tolerate standing x 2 trials for ~1 minute each. Pt with significant RLE knee buckling & shaking and pt unable to attempt hops. Pt self propelled w/c 150 ft back to room & PT provided assistance with positioning w/c beside bed. Pt able to complete squat pivot w/c>bed with mod A with cuing to utilize bed rail. At end of session pt left sitting on EOB with all needs within reach & bed alarm set.   Therapy Documentation Precautions:  Precautions Precautions: Fall Precaution Comments: wound vac Required Braces or Orthoses: Other Brace/Splint Other Brace/Splint: has prosthetic for RLE Restrictions Weight Bearing Restrictions: Yes LLE Weight Bearing: Non weight bearing Other Position/Activity Restrictions: LLE NWB  Pain: Pain Assessment Pain Assessment: No/denies pain   See Function Navigator for Current Functional Status.   Therapy/Group: Individual Therapy  [name] 12/09/2015, 9:04 AM

## 2015-12-09 NOTE — Plan of Care (Signed)
Problem: RH Stairs Goal: LTG Patient will ambulate up and down stairs w/assist (PT) LTG: Patient will ambulate up and down # of stairs with assistance (PT)  Outcome: Not Applicable Date Met:  85/50/15 Pt to d/c at w/c level.

## 2015-12-09 NOTE — Plan of Care (Signed)
Problem: RH Ambulation Goal: LTG Patient will ambulate in controlled environment (PT) LTG: Patient will ambulate in a controlled environment, # of feet with assistance (PT).  15 ft with LRAD; downgrade 2/2 slow progress Goal: LTG Patient will ambulate in home environment (PT) LTG: Patient will ambulate in home environment, # of feet with assistance (PT).  Outcome: Not Applicable Date Met:  99/37/16 Downgrade 2/2 slow progress; pt to d/c at w/c level

## 2015-12-09 NOTE — Progress Notes (Signed)
[place] PHYSICAL MEDICINE & REHABILITATION     PROGRESS NOTE  Subjective/Complaints:  Patient sitting up at the edge of her bed this morning.  ROS:  Denies CP, SOB, N/V/D.  Objective: Vital Signs: Blood pressure 153/58, pulse 76, temperature 98.7 F (37.1 C), temperature source Oral, resp. rate 17, weight 51.1 kg (112 lb 10.5 oz), SpO2 97 %. No results found.  Recent Labs  12/06/15 1512  WBC 12.6*  HGB 9.6*  HCT 29.2*  PLT 472*    Recent Labs  12/06/15 1512  NA 127*  K 3.9  CL 87*  GLUCOSE 219*  BUN 41*  CREATININE 7.01*  CALCIUM 8.7*   CBG (last 3)   Recent Labs  12/08/15 1142 12/08/15 1631 12/08/15 2051  GLUCAP 119* 185* 119*    Wt Readings from Last 3 Encounters:  12/09/15 51.1 kg (112 lb 10.5 oz)  12/04/15 51 kg (112 lb 7 oz)  11/25/15 54.885 kg (121 lb)    Physical Exam:  BP 153/58 mmHg  Pulse 76  Temp(Src) 98.7 F (37.1 C) (Oral)  Resp 17  Wt 51.1 kg (112 lb 10.5 oz)  SpO2 97% Constitutional: She appears well-developed. She has a sickly appearance.  HENT:  Normocephalic and atraumatic.   Eyes: Conjunctivae and EOM are normal.  Cardiovascular: Normal rate and regular rhythm.No murmur heard. Respiratory: Effort normal and breath sounds normal. No respiratory distress. She has no wheezes.  GI: Soft. Bowel sounds are normal. She exhibits no distension. There is no tenderness.  Musculoskeletal: She exhibits tenderness. She exhibits no edema.  VAC on place on L-BKA amp site. R-BKA healed well.  Neurological: She is alert and oriented.  Motor: Bilateral upper extremities: 4+/5 proximal to distal Right lower extremity: BKA, 5/5 hip flexion  Left lower extremity: Hip flexion 4+/5 (pain inhibition)  Skin: Skin is warm and dry.  VAC on left BKA, suctioning without leaks.  Psychiatric: She has a normal mood and affect. Her behavior is normal. Thought content normal.   Assessment/Plan: 1. Functional deficits secondary to to left BKA, with  previous right BKA which require 3+ hours per day of interdisciplinary therapy in a comprehensive inpatient rehab setting. Physiatrist is providing close team supervision and 24 hour management of active medical problems listed below. Physiatrist and rehab team continue to assess barriers to discharge/monitor patient progress toward functional and medical goals.  Function:  Bathing Bathing position   Position: Sitting EOB  Bathing parts Body parts bathed by patient: Right arm, Chest, Left arm, Abdomen, Front perineal area, Buttocks, Right upper leg, Left upper leg Body parts bathed by helper: Back  Bathing assist Assist Level: Touching or steadying assistance(Pt > 75%)      Upper Body Dressing/Undressing Upper body dressing   What is the patient wearing?: Pull over shirt/dress Bra - Perfomed by patient: Thread/unthread right bra strap, Thread/unthread left bra strap, Hook/unhook bra (pull down sports bra) Bra - Perfomed by helper: Thread/unthread left bra strap Pull over shirt/dress - Perfomed by patient: Thread/unthread right sleeve, Thread/unthread left sleeve, Put head through opening, Pull shirt over trunk Pull over shirt/dress - Perfomed by helper: Pull shirt over trunk        Upper body assist Assist Level: Set up   Set up : To obtain clothing/put away  Lower Body Dressing/Undressing Lower body dressing Lower body dressing/undressing activity did not occur: Refused What is the patient wearing?: Underwear, Pants Underwear - Performed by patient: Thread/unthread right underwear leg, Pull underwear up/down Underwear - Performed by  helper: Thread/unthread left underwear leg Pants- Performed by patient: Pull pants up/down, Thread/unthread right pants leg Pants- Performed by helper: Thread/unthread left pants leg                      Lower body assist Assist for lower body dressing: Touching or steadying assistance (Pt > 75%)      Toileting Toileting Toileting activity  did not occur: N/A   Toileting steps completed by helper: Adjust clothing prior to toileting, Performs perineal hygiene, Adjust clothing after toileting    Toileting assist Assist level:  (Max Assist)   Transfers Chair/bed transfer   Chair/bed transfer method: Squat pivot Chair/bed transfer assist level: Moderate assist (Pt 50 - 74%/lift or lower) Chair/bed transfer assistive device: Prosthesis     Locomotion Ambulation Ambulation activity did not occur: Safety/medical concerns   Max distance: 81ft Assist level: Touching or steadying assistance (Pt > 75%)   Wheelchair   Type: Manual Max wheelchair distance: 150 Assist Level: Supervision or verbal cues  Cognition Comprehension Comprehension assist level: Follows basic conversation/direction with no assist  Expression Expression assist level: Expresses basic needs/ideas: With no assist  Social Interaction Social Interaction assist level: Interacts appropriately with others with medication or extra time (anti-anxiety, antidepressant).  Problem Solving Problem solving assist level: Solves basic problems with no assist  Memory Memory assist level: Recognizes or recalls 90% of the time/requires cueing < 10% of the time    Medical Problem List and Plan: 1. Abnormality of gait, safety, deficits with transfers secondary to left BKA, with previous right BKA.  Continue CIR  Will follow-up regarding DC of VAC today 2. DVT Prophylaxis/Anticoagulation: Pharmaceutical: Lovenox 3. Pain Management:Hydrocodone prn for pain, low dose robaxin to help with muscle spasms.  4. Mood: Very motivated to get better and resume independent living. LCSW to follow for evaluation and support.  5. Neuropsych: This patient is capable of making decisions on her own behalf. 6. Skin/Wound Care: Routine pressure relief measures.   7. Fluids/Electrolytes/Nutrition: Labs being monitored ---hyponateremia/hyperkalemia being managed by HD, appreciate recs.  8.  ESRD: On renal diet with 1200 FR. Hemodialysis after therapy sessions on MWF to help with tolerance of therapy.  9. DMT2: Monitor BS ac/hs.  Will remove diabetic retrictions to help with food choices/intake. Use SSI for elevated BS.   Restarted home glipizide 5 mg on 4/8, will continue to monitor and consider further increase, however morning CBG remains pending today. 10 HTN:   Labile at present, will not start any medication to avoid hypotension during dialysis 11. Anemia of chronic disease: Continue Aransep weekly.  12. Metabolic bone disease: On renvela.  13. Hypoxia: Improving. Encourage IS.  14. Dysphagia  Patient was scheduled to get workup prior to admission  SLP for swallowing 15. Leukocytosis, afebrile  WBCs 12.6 on 4/7  Will order UA/U culture 16. Urinary retention  Patient has not had urine after being cathed 4/8  LOS (Days) 5 A FACE TO FACE EVALUATION WAS PERFORMED  Johnathan Tortorelli Lorie Phenix 12/09/2015 9:39 AM

## 2015-12-09 NOTE — Progress Notes (Signed)
Social Work Patient ID: [name], female   DOB: 1951-03-19, 65 y.o.   MRN: [id number] Have asked Victoria-PT about pt needing a power chair for home, pt has asked this worker numerous times. Will ask PT to address this with pt and then either pursue it or discuss With pt reason not to pursue. Will check to see when pt received manual wheelchair, due to insurance may not cover if has not been long enough.

## 2015-12-10 ENCOUNTER — Inpatient Hospital Stay (HOSPITAL_COMMUNITY): Payer: BC Managed Care – PPO | Admitting: Physical Therapy

## 2015-12-10 ENCOUNTER — Inpatient Hospital Stay (HOSPITAL_COMMUNITY): Payer: BC Managed Care – PPO

## 2015-12-10 ENCOUNTER — Inpatient Hospital Stay (HOSPITAL_COMMUNITY): Payer: BC Managed Care – PPO | Admitting: *Deleted

## 2015-12-10 ENCOUNTER — Ambulatory Visit: Payer: BC Managed Care – PPO | Admitting: Internal Medicine

## 2015-12-10 DIAGNOSIS — Z0289 Encounter for other administrative examinations: Secondary | ICD-10-CM

## 2015-12-10 LAB — GLUCOSE, CAPILLARY
Glucose-Capillary: 121 mg/dL — ABNORMAL HIGH (ref 65–99)
Glucose-Capillary: 146 mg/dL — ABNORMAL HIGH (ref 65–99)
Glucose-Capillary: 234 mg/dL — ABNORMAL HIGH (ref 65–99)
Glucose-Capillary: 76 mg/dL (ref 65–99)

## 2015-12-10 LAB — OCCULT BLOOD X 1 CARD TO LAB, STOOL: Fecal Occult Bld: NEGATIVE

## 2015-12-10 NOTE — Plan of Care (Signed)
Problem: RH Balance Goal: LTG Patient will maintain dynamic standing balance (PT) LTG: Patient will maintain dynamic standing balance with assistance during mobility activities (PT)  Upgraded 2/2 pt progress

## 2015-12-10 NOTE — Progress Notes (Signed)
Occupational Therapy Session Note  Patient Details  Name: Carolyn Valenzuela MRN: [id number] Date of Birth: 01-21-1951  Today's Date: 12/10/2015 OT Individual Time: 0930-1030 OT Individual Time Calculation (min): 60 min   Short Term Goals: Week 1:  OT Short Term Goal 1 (Week 1): Complete toileting with min assist OT Short Term Goal 2 (Week 1): Complete toilet transfer with setup and supervision OT Short Term Goal 3 (Week 1): Demo ability to retreieve and prepare simple meal at w/c level OT Short Term Goal 4 (Week 1): Complete lower body bathing with min assist OT Short Term Goal 5 (Week 1): Doff and don pants seated using lateral leans and AE, prn  Skilled Therapeutic Interventions/Progress Updates: ADL-retraining seated at w/c near sink and EOB (for lower body dressing).   Pt received supine in bed and reporting unusual lasting fatigue.   Pt did not eat her breakfast and requested explanation regarding dietary restrictions.   OT alerted RN to need for clarification and re-ed on renal diet restrictions.   With extra time, and setup to don prosthesis, pt completed squat-pivot transfer to w/c and gathered her own clothing.   Pt completed bathing in w/c at sink using lateral leans to wash buttocks.   Pt returned to bed with only steadying assist to maintain w/c stability during squat pivot transfer.  Pt required min assist to lace right pant leg over prosthesis and then elected to stand supported at [place] asking therapist to pull up her pants rather than attempt lateral leans or bed mobility and bed rolls to pull up her pants while supine.   Pt then sat at EOB to self-feed cereal provided with setup to place over bed table.  Pt left at EOB as RT arrived for next session.     Therapy Documentation Precautions:  Precautions Precautions: Fall Precaution Comments: wound vac Required Braces or Orthoses: Other Brace/Splint Other Brace/Splint: has prosthetic for RLE Restrictions Weight Bearing  Restrictions: Yes LLE Weight Bearing: Non weight bearing Other Position/Activity Restrictions: LLE NWB  Pain: Pain Assessment Pain Assessment: 0-10 Pain Score: 3  Pain Location: Leg Pain Orientation: Left  ADL: ADL ADL Comments: see Functional Assessment Tool   Other Treatments:    See Function Navigator for Current Functional Status.   Therapy/Group: Individual Therapy  [place] 12/10/2015, 12:10 PM

## 2015-12-10 NOTE — Progress Notes (Signed)
Subjective: Interval History: has complaints wants to get up.  Objective: Vital signs in last 24 hours: Temp:  [98.7 F (37.1 C)-99.6 F (37.6 C)] 99.3 F (37.4 C) (04/11 0555) Pulse Rate:  [71-85] 76 (04/11 0555) Resp:  [18] 18 (04/11 0555) BP: (76-163)/(50-66) 150/57 mmHg (04/11 0555) SpO2:  [97 %-100 %] 100 % (04/11 0555) Weight:  [50.6 kg (111 lb 8.8 oz)-52.7 kg (116 lb 2.9 oz)] 52.7 kg (116 lb 2.9 oz) (04/11 0555) Weight change: 1.3 kg (2 lb 13.9 oz)  Intake/Output from previous day: 04/10 0701 - 04/11 0700 In: 240 [P.O.:240] Out: 2000  Intake/Output this shift:    General appearance: alert, cooperative and no distress Resp: clear to auscultation bilaterally Cardio: S1, S2 normal and systolic murmur: holosystolic 2/6, blowing at apex GI: soft, non-tender; bowel sounds normal; no masses,  no organomegaly Extremities: BILAT bkas, new on L, AVF LUA  Lab Results: No results for input(s): WBC, HGB, HCT, PLT in the last 72 hours. BMET:  Recent Labs  12/09/15 1854  NA 129*  K 3.5  CL 91*  CO2 26  GLUCOSE 91  BUN 27*  CREATININE 6.08*  CALCIUM 8.4*   No results for input(s): PTH in the last 72 hours. Iron Studies: No results for input(s): IRON, TIBC, TRANSFERRIN, FERRITIN in the last 72 hours.  Studies/Results: No results found.  I have reviewed the patient's current medications.  Assessment/Plan: 1 ESRD HD last pm , next on Wed 2 Anemia on esa 3 PVD per rehab 4 DM controlled 5 HTN improving 6 HPTH Vit D P HD MWF, rehab, glu control    LOS: 6 days   Roman Sandall L 12/10/2015,7:54 AM

## 2015-12-10 NOTE — Progress Notes (Signed)
Physical Therapy Session Note  Patient Details  Name: Carolyn Valenzuela MRN: [id number] Date of Birth: 1951-08-27  Today's Date: 12/10/2015 PT Individual Time: 1138-1202 PT Individual Time Calculation (min): 24 min   Short Term Goals: Week 1:  PT Short Term Goal 1 (Week 1): bed mobility without cues or Assistance.  PT Short Term Goal 2 (Week 1): Bed chair transfer with Min A with sqaut pivot.  PT Short Term Goal 3 (Week 1): Sit to stand with RW with min A.  PT Short Term Goal 4 (Week 1): gait with RW for 36ft with min A.   Skilled Therapeutic Interventions/Progress Updates:   Pt received in bed; reporting pain but not time for medication yet.  Pt agreeable to therapy.  Discussed equipment at home and home set up.  Pt's goal is to receive another prosthesis; discussed with pt insurance limitations and insurance will not cover both a prosthesis and power w/c; pt agreeable to continue to use manual w/c at home.  Discussed need for L amputee support pad for w/c at home.  Pt reports she takes a [employer] transportation system to/from HD and they will come to the door to assist her up/down ramp and they have a w/c lift on the van.    Pt performed supine > sit on flat bed, no rails to simulate bed at home with supervision.  Pt donned prosthesis EOB with min-mod A to click prosthesis fully in place in squat position; reports her sister can assist her with this at home.  Performed bed > w/c transfer squat pivot from flat bed, no rails to simulate set up at home with mod A to maintain squat position during full pivot.  Performed transfer w/c > recliner to simulate transfer into HD chair with pt performing set up of w/c with min A and squat pivot into recliner with mod A.  Pt requesting to stay in recliner for lunch.  Pt set up with all items within reach.   Therapy Documentation Precautions:  Precautions Precautions: Fall Precaution Comments: wound vac Required Braces or Orthoses: Other Brace/Splint Other  Brace/Splint: has prosthetic for RLE Restrictions Weight Bearing Restrictions: Yes LLE Weight Bearing: Non weight bearing Other Position/Activity Restrictions: LLE NWB Pain: Pain Assessment Pain Assessment: 0-10 Pain Score: 3  Pain Location: Leg Pain Orientation: Left  See Function Navigator for Current Functional Status.   Therapy/Group: Individual Therapy  Raylene Everts [facility] 12/10/2015, 12:55 PM

## 2015-12-10 NOTE — Progress Notes (Signed)
[place] PHYSICAL MEDICINE & REHABILITATION     PROGRESS NOTE  Subjective/Complaints:  Patient seen sitting up at the edge of her bed this morning about to work with therapies. Her wound VAC was removed yesterday. She states she slept well overnight.  ROS:  Denies CP, SOB, N/V/D.  Objective: Vital Signs: Blood pressure 150/57, pulse 76, temperature 99.3 F (37.4 C), temperature source Oral, resp. rate 18, weight 52.7 kg (116 lb 2.9 oz), SpO2 100 %. No results found. No results for input(s): WBC, HGB, HCT, PLT in the last 72 hours.  Recent Labs  12/09/15 1854  NA 129*  K 3.5  CL 91*  GLUCOSE 91  BUN 27*  CREATININE 6.08*  CALCIUM 8.4*   CBG (last 3)   Recent Labs  12/09/15 1641 12/09/15 2240 12/10/15 0622  GLUCAP 102* 126* 121*    Wt Readings from Last 3 Encounters:  12/10/15 52.7 kg (116 lb 2.9 oz)  12/04/15 51 kg (112 lb 7 oz)  11/25/15 54.885 kg (121 lb)    Physical Exam:  BP 150/57 mmHg  Pulse 76  Temp(Src) 99.3 F (37.4 C) (Oral)  Resp 18  Wt 52.7 kg (116 lb 2.9 oz)  SpO2 100% Constitutional: She appears well-developed. NAD.  HENT:  Normocephalic and atraumatic.   Eyes: Conjunctivae and EOM are normal.  Cardiovascular: Normal rate and regular rhythm.No murmur heard. Respiratory: Effort normal and breath sounds normal. No respiratory distress. She has no wheezes.  GI: Soft. Bowel sounds are normal. She exhibits no distension. There is no tenderness.  Musculoskeletal: She exhibits mild edema, no tenderness.   left BKA. R-BKA healed well.  Neurological: She is alert and oriented.  Motor: Bilateral upper extremities: 4+/5 proximal to distal Right lower extremity: BKA, 5/5 hip flexion  Left lower extremity: Hip flexion 4+/5 (pain inhibition)  Skin: Skin is warm and dry.   left BKA with staples C/D/I  Psychiatric: She has a normal mood and affect. Her behavior is normal. Thought content normal.   Assessment/Plan: 1. Functional deficits secondary  to to left BKA, with previous right BKA which require 3+ hours per day of interdisciplinary therapy in a comprehensive inpatient rehab setting. Physiatrist is providing close team supervision and 24 hour management of active medical problems listed below. Physiatrist and rehab team continue to assess barriers to discharge/monitor patient progress toward functional and medical goals.  Function:  Bathing Bathing position   Position: Sitting EOB  Bathing parts Body parts bathed by patient: Right arm, Chest, Left arm, Abdomen, Front perineal area, Buttocks, Right upper leg, Left upper leg Body parts bathed by helper: Back  Bathing assist Assist Level: Touching or steadying assistance(Pt > 75%)   Set up : To obtain items  Upper Body Dressing/Undressing Upper body dressing   What is the patient wearing?: Pull over shirt/dress Bra - Perfomed by patient: Thread/unthread right bra strap, Thread/unthread left bra strap, Hook/unhook bra (pull down sports bra) Bra - Perfomed by helper: Thread/unthread left bra strap Pull over shirt/dress - Perfomed by patient: Thread/unthread right sleeve, Thread/unthread left sleeve, Put head through opening, Pull shirt over trunk Pull over shirt/dress - Perfomed by helper: Pull shirt over trunk        Upper body assist Assist Level: Set up   Set up : To obtain clothing/put away  Lower Body Dressing/Undressing Lower body dressing Lower body dressing/undressing activity did not occur: Refused What is the patient wearing?: Underwear, Pants Underwear - Performed by patient: Thread/unthread right underwear leg, Pull underwear  up/down Underwear - Performed by helper: Thread/unthread left underwear leg Pants- Performed by patient: Pull pants up/down, Thread/unthread right pants leg Pants- Performed by helper: Thread/unthread left pants leg                      Lower body assist Assist for lower body dressing:  (patient performed 5/7, 71%, moderate  assist)      Toileting Toileting Toileting activity did not occur: N/A   Toileting steps completed by helper: Adjust clothing prior to toileting, Performs perineal hygiene, Adjust clothing after toileting    Toileting assist Assist level:  (helper did all tasks, total assist)   Transfers Chair/bed transfer   Chair/bed transfer method: Squat pivot Chair/bed transfer assist level: Moderate assist (Pt 50 - 74%/lift or lower) Chair/bed transfer assistive device: Bedrails, Armrests     Locomotion Ambulation Ambulation activity did not occur: Safety/medical concerns   Max distance: 12ft Assist level: Touching or steadying assistance (Pt > 75%)   Wheelchair   Type: Manual Max wheelchair distance: 150 Assist Level: Touching or steadying assistance (Pt > 75%)  Cognition Comprehension Comprehension assist level: Follows basic conversation/direction with no assist  Expression Expression assist level: Expresses basic needs/ideas: With no assist  Social Interaction Social Interaction assist level: Interacts appropriately with others with medication or extra time (anti-anxiety, antidepressant).  Problem Solving Problem solving assist level: Solves basic problems with no assist  Memory Memory assist level: Recognizes or recalls 90% of the time/requires cueing < 10% of the time    Medical Problem List and Plan: 1. Abnormality of gait, safety, deficits with transfers secondary to left BKA, with previous right BKA.  Continue CIR 2. DVT Prophylaxis/Anticoagulation: Pharmaceutical: Lovenox 3. Pain Management:Hydrocodone prn for pain, low dose robaxin to help with muscle spasms.  4. Mood: Very motivated to get better and resume independent living. LCSW to follow for evaluation and support.  5. Neuropsych: This patient is capable of making decisions on her own behalf. 6. Skin/Wound Care: Routine pressure relief measures.   7. Fluids/Electrolytes/Nutrition: Labs being monitored  ---hyponateremia/hyperkalemia being managed by HD, appreciate recs.  8. ESRD: On renal diet with 1200 FR. Hemodialysis after therapy sessions on MWF to help with tolerance of therapy.  9. DMT2: Monitor BS ac/hs.  Will remove diabetic retrictions to help with food choices/intake. Use SSI for elevated BS.   Restarted home glipizide 5 mg on 4/8, will continue to monitor and consider further increase. 10 HTN:   Labile at present, will not start any medication to avoid hypotension during dialysis 11. Anemia of chronic disease: Continue Aransep weekly.  12. Metabolic bone disease: On renvela.  13. Hypoxia: Improving. Encourage IS.  14. Dysphagia  Patient was scheduled to get workup prior to admission  SLP for swallowing 15. Leukocytosis, afebrile  WBCs 12.6 on 4/7  Will order UA/U culture 16. Urinary retention  Patient has not had produced urine all 4.1 after being cathed 4/8  LOS (Days) 6 A FACE TO FACE EVALUATION WAS PERFORMED  Ankit Lorie Phenix 12/10/2015 10:14 AM

## 2015-12-10 NOTE — Plan of Care (Signed)
Problem: RH Balance Goal: LTG Patient will maintain dynamic standing balance (PT) LTG: Patient will maintain dynamic standing balance with assistance during mobility activities (PT)  Goal should be set at supervision with LRAD; upgraded previously in error.

## 2015-12-10 NOTE — Progress Notes (Signed)
Nutrition Follow-up  DOCUMENTATION CODES:   Not applicable  INTERVENTION:  Continue 30 ml Prostat po TID, each supplement provides 100 kcal and 15 grams of protein.   Provide nourishment snacks (Ordered).  Encourage adequate PO intake.   NUTRITION DIAGNOSIS:   Increased nutrient needs related to chronic illness as evidenced by estimated needs;ongoing  GOAL:   Patient will meet greater than or equal to 90% of their needs; met  MONITOR:   PO intake, Supplement acceptance, Weight trends, Labs, I & O's, Skin  REASON FOR ASSESSMENT:   Consult Poor PO  ASSESSMENT:   65 y.o. 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 She was admitted on 11/29/15 for L-BKA by Dr. Sharol Given.Continuing Pre[place]ana wound VAC for 1 week post surgery. VAC removed yesterday 4/10.   RD contacted via RN regarding questions about her diet. Pt questioned why she was unable to have oatmeal and/or pasta but was able to have potatoes. Pt was educated that oatmeal is high in  Phosphorous and pasta with tomato sauce is high in potassium. The potatoes on the renal diet are leached prior to serving thus is appropriate on tray. Pt expressed understanding. Meal completion has been mostly 100%. Noted Nepro Shake has been discontinued due to elevated CBG's and Prostat TID was ordered instead and pt has been consuming them. Family at bedside requests nourishment snacks for pt. Snacks have been ordered. Pt encouraged to eat her food at meals and to consume her supplements.   Diet Order:  Diet renal with fluid restriction Fluid restriction:: 1200 mL Fluid; Room service appropriate?: Yes; Fluid consistency:: Thin  Skin:   (Bilateral BKA)  Last BM:  4/11  Height:   Ht Readings from Last 1 Encounters:  11/29/15 '4\' 11"'  (1.499 m)    Weight:    Wt Readings from Last 1 Encounters:  12/10/15 116 lb 2.9 oz (52.7 kg)    Ideal Body Weight:  38.8 kg (adjusted for bilateral BKA)  BMI:  Body mass index is 23.45 kg/(m^2).  Estimated Nutritional Needs:   Kcal:  1450-1650  Protein:  65-75 grams  Fluid:  1.2 L/day  EDUCATION NEEDS:   No education needs identified at this time  Corrin Parker, MS, RD, LDN Pager # [phone] After hours/ weekend pager # [phone]

## 2015-12-10 NOTE — Progress Notes (Signed)
Recreational Therapy Assessment and Plan  Patient Details  Name: Carolyn Valenzuela MRN: [id number] Date of Birth: 03/25/1951 Today's Date: 12/10/2015 Assessment Clinical Impression: Met with pt discuss leisure interests, use of leisure time post discharge, activity analysis with modification/adaptations & community pursuits.  Pt with little interest in TR participation at his time as she states her primary interest is watching TV (primary leisure interest prior to health issues).  Will monitor pt through team for participation in community reintegration.  No further TR at this time.   The above assessment, treatment plan, treatment alternatives and goals were discussed and mutually agreed upon: by patient  [place] 12/10/2015, 4:14 PM

## 2015-12-10 NOTE — Progress Notes (Signed)
Physical Therapy Session Note  Patient Details  Name: Carolyn Valenzuela MRN: [id number] Date of Birth: [birth year]  Today's Date: 12/10/2015 PT Individual Time: [id number] and [id number] PT Individual Time Calculation (min): 44 min and 58 minutes  Short Term Goals: Week 1:  PT Short Term Goal 1 (Week 1): bed mobility without cues or Assistance.  PT Short Term Goal 2 (Week 1): Bed chair transfer with Min A with sqaut pivot.  PT Short Term Goal 3 (Week 1): Sit to stand with RW with min A.  PT Short Term Goal 4 (Week 1): gait with RW for 20ft with min A.   Skilled Therapeutic Interventions/Progress Updates:    Treatment 1: Pt received sitting on EOB & agreeable to PT. Pt reported 8/10 L residual limb pain & RN arrived to administer pain medication. Discussed with pt need to talk to family members to see if anyone can assist her following d/c, as she will likely need help. Session focused on wrapping residual limb, transfers & standing/gait training. PT educated pt on anchoring ace wrap at thigh, then wrapping residual limb in figure-8 pattern instead of circumferentially to prevent tourniquet effect, to minimize wrinkles in wrapping and to cover all surfaces of residual limb. Pt did not practice technique herself & will need to follow up on this. PT also instructed need to re-wrap limb with ace wrap every 3-4 hours as wrapping will become lose with movement & wear time; pt verbalized understanding. Pt able to don R LE liner and only required assistance to hold prosthesis to allow prosthetic click in place. Pt completed squat pivot bed>w/c with mod A. Pt then completed sit>stand with Min A with cuing for hand placement on armrests. Once in standing pt demonstrated less R knee buckling and was able to push up on BUE to advance RLE forward. After only hopping 1 ft pt required seated rest break 2/2 RLE fatigue. Pt then reported need to use restroom & PT assisted pt with Mod A squat pivot w/c>commode. Pt held on  to grab bar with RUE & pushed with LUE from w/c armrest. At end of session pt left sitting on toilet with all needs within reach & clear instruction to use call bell when finished toileting, pt verbalized understanding & PT notified RN of pt being on toilet.   Treatment 2: Pt received in recliner, sister Carolyn Valenzuela present, & pt agreeable to PT. Pt reporting she would like to go to [facility] after d/c from CIR. Informed CSW & CSW educated pt that insurance would not pay for her to d/c to SNF following CIR. Session spent providing education and discussing home set up & d/c plan with pt & sister.  Pt's sister reports pt bathroom is not w/c accessible. PT educated pt on ability to take sponge baths and use BSC but would still need assistance to empty BSC. Pt & sister under the impression that she was able to d/c to SNF following CIR and very unpleased that she will not be able to do this unless she pays out of pocket. Discussed with pt on need to see if family could assist her following d/c. Pt's sister reports she has a very steep & uneven ramp, made by her nephew, then she must access a porch and a single step up into the house. PT unaware of pt having step to enter house. Discussed with pt current inability to hop up step into house and potential to install longer ramp at home. Per report pt  is renting the house and is unable to install a different ramp. PT provided suggestion that pt d/c to a family member's home until safe enough to return home alone, but pt reports this is not possibility because all other family members have multiple steps to get into house or she is unable to stay with them for other reasons. Pt's other sister, Carolyn Valenzuela, also present for last few minutes of session. Pt & sisters report that family members cannot take turns to provide assistance to pt upon d/c. Continued to encourage pt and family to discuss potential d/c locations if possible. Educated pt on need to continue PT to  increase her strength to allow her to become as independent as possible. Pt left in recliner with all needs within reach & sisters present at end of session.  Therapy Documentation Precautions:  Precautions Precautions: Fall Required Braces or Orthoses: Other Brace/Splint Other Brace/Splint: has prosthetic for RLE Restrictions Weight Bearing Restrictions: Yes LLE Weight Bearing: Non weight bearing Other Position/Activity Restrictions: LLE NWB  Pain: Pain Assessment Pain Assessment: 0-10 Pain Score: 8  Pain Location:  (L residual limb) Pain Intervention(s):  (RN administered pain medication)   See Function Navigator for Current Functional Status.   Therapy/Group: Individual Therapy  [name] 12/10/2015, 8:11 AM

## 2015-12-10 NOTE — Progress Notes (Signed)
Social Work Patient ID: Carolyn Valenzuela, female   DOB: [birth year], 65 y.o.   MRN: [id number] [name]-PT discussed power chair versus her own manuel wheelchair. Pt really wants a prothesis and can not have both power chair and prothesis. She reports her manual wheelchair is functional and can be used in her apartment. She will drop the discussion on power chair. She is making good progress and  Anxious to hear about team conference tomorrow. She wants to be ready though and not go home too soon.

## 2015-12-10 NOTE — Progress Notes (Signed)
Social Work Patient ID: [name], female   DOB: 1951/06/24, 65 y.o.   MRN: [id number] Discussed with pt and her sister that her [facility] will not cover for her to go to a NH when leaving here. She was told by the AC-Susan if she came to rehab her insurance would not cover NH. Pt chose to come to rehab. Will see team conference discussion tomorrow and may need to have pt stay longer so safe at home if can realistically get to that level at discharge. Pt thinks she has Medicaid And will see if she does can try to get PCS services at discharge.

## 2015-12-11 ENCOUNTER — Inpatient Hospital Stay (HOSPITAL_COMMUNITY): Payer: BC Managed Care – PPO | Admitting: Physical Therapy

## 2015-12-11 ENCOUNTER — Inpatient Hospital Stay (HOSPITAL_COMMUNITY): Payer: BC Managed Care – PPO | Admitting: Occupational Therapy

## 2015-12-11 ENCOUNTER — Inpatient Hospital Stay (HOSPITAL_COMMUNITY): Payer: BC Managed Care – PPO

## 2015-12-11 DIAGNOSIS — E11649 Type 2 diabetes mellitus with hypoglycemia without coma: Secondary | ICD-10-CM

## 2015-12-11 DIAGNOSIS — R509 Fever, unspecified: Secondary | ICD-10-CM

## 2015-12-11 LAB — CBC WITH DIFFERENTIAL/PLATELET
Basophils Absolute: 0 10*3/uL (ref 0.0–0.1)
Basophils Relative: 0 %
Eosinophils Absolute: 0.1 10*3/uL (ref 0.0–0.7)
Eosinophils Relative: 1 %
HCT: 30.2 % — ABNORMAL LOW (ref 36.0–46.0)
Hemoglobin: 9.9 g/dL — ABNORMAL LOW (ref 12.0–15.0)
Lymphocytes Relative: 24 %
Lymphs Abs: 3.4 10*3/uL (ref 0.7–4.0)
MCH: 30.6 pg (ref 26.0–34.0)
MCHC: 32.8 g/dL (ref 30.0–36.0)
MCV: 93.2 fL (ref 78.0–100.0)
Monocytes Absolute: 1.4 10*3/uL — ABNORMAL HIGH (ref 0.1–1.0)
Monocytes Relative: 10 %
Neutro Abs: 9.2 10*3/uL — ABNORMAL HIGH (ref 1.7–7.7)
Neutrophils Relative %: 65 %
Platelets: 462 10*3/uL — ABNORMAL HIGH (ref 150–400)
RBC: 3.24 MIL/uL — ABNORMAL LOW (ref 3.87–5.11)
RDW: 14.9 % (ref 11.5–15.5)
WBC: 14.1 10*3/uL — ABNORMAL HIGH (ref 4.0–10.5)

## 2015-12-11 LAB — URINALYSIS, ROUTINE W REFLEX MICROSCOPIC
Bilirubin Urine: NEGATIVE
Glucose, UA: 100 mg/dL — AB
Ketones, ur: NEGATIVE mg/dL
Nitrite: NEGATIVE
Protein, ur: >300 mg/dL — AB
Specific Gravity, Urine: 1.014 (ref 1.005–1.030)
pH: 7.5 (ref 5.0–8.0)

## 2015-12-11 LAB — GLUCOSE, CAPILLARY
Glucose-Capillary: 58 mg/dL — ABNORMAL LOW (ref 65–99)
Glucose-Capillary: 71 mg/dL (ref 65–99)
Glucose-Capillary: 119 mg/dL — ABNORMAL HIGH (ref 65–99)
Glucose-Capillary: 362 mg/dL — ABNORMAL HIGH (ref 65–99)

## 2015-12-11 LAB — URINE MICROSCOPIC-ADD ON

## 2015-12-11 LAB — BASIC METABOLIC PANEL
Anion gap: 11 (ref 5–15)
BUN: 42 mg/dL — ABNORMAL HIGH (ref 6–20)
CO2: 24 mmol/L (ref 22–32)
Calcium: 8.8 mg/dL — ABNORMAL LOW (ref 8.9–10.3)
Chloride: 95 mmol/L — ABNORMAL LOW (ref 101–111)
Creatinine, Ser: 6.66 mg/dL — ABNORMAL HIGH (ref 0.44–1.00)
GFR calc Af Amer: 7 mL/min — ABNORMAL LOW (ref >60)
GFR calc non Af Amer: 6 mL/min — ABNORMAL LOW (ref >60)
Glucose, Bld: 84 mg/dL (ref 65–99)
Potassium: 4.5 mmol/L (ref 3.5–5.1)
Sodium: 130 mmol/L — ABNORMAL LOW (ref 135–145)

## 2015-12-11 MED ORDER — NEPRO/CARBSTEADY PO LIQD
237.0000 mL | Freq: Three times a day (TID) | ORAL | Status: DC
  Administered 2015-12-11 – 2015-12-20 (×14): 237 mL via ORAL

## 2015-12-11 MED ORDER — SENNOSIDES-DOCUSATE SODIUM 8.6-50 MG PO TABS
2.0000 | ORAL_TABLET | Freq: Two times a day (BID) | ORAL | Status: DC
  Administered 2015-12-12 – 2015-12-16 (×4): 2 via ORAL
  Filled 2015-12-11 (×6): qty 2

## 2015-12-11 MED ORDER — DARBEPOETIN ALFA 100 MCG/0.5ML IJ SOSY
PREFILLED_SYRINGE | INTRAMUSCULAR | Status: AC
  Filled 2015-12-11: qty 0.5

## 2015-12-11 MED ORDER — PIPERACILLIN-TAZOBACTAM IN DEX 2-0.25 GM/50ML IV SOLN
2.2500 g | Freq: Three times a day (TID) | INTRAVENOUS | Status: DC
  Administered 2015-12-11 – 2015-12-18 (×18): 2.25 g via INTRAVENOUS
  Filled 2015-12-11 (×26): qty 50

## 2015-12-11 MED ORDER — GLIPIZIDE 5 MG PO TABS
2.5000 mg | ORAL_TABLET | Freq: Every day | ORAL | Status: DC
  Administered 2015-12-12 – 2015-12-20 (×9): 2.5 mg via ORAL
  Filled 2015-12-11 (×10): qty 1

## 2015-12-11 MED ORDER — LIDOCAINE HCL 2 % EX GEL
1.0000 "application " | Freq: Once | CUTANEOUS | Status: AC
  Administered 2015-12-11: 1 via URETHRAL
  Filled 2015-12-11: qty 5

## 2015-12-11 MED ORDER — HYDROCODONE-ACETAMINOPHEN 5-325 MG PO TABS
ORAL_TABLET | ORAL | Status: AC
  Filled 2015-12-11: qty 2

## 2015-12-11 MED ORDER — VANCOMYCIN HCL IN DEXTROSE 1-5 GM/200ML-% IV SOLN
1000.0000 mg | INTRAVENOUS | Status: AC
  Administered 2015-12-11: 1000 mg via INTRAVENOUS
  Filled 2015-12-11: qty 200

## 2015-12-11 MED ORDER — VANCOMYCIN HCL 500 MG IV SOLR
500.0000 mg | INTRAVENOUS | Status: DC
  Administered 2015-12-13 – 2015-12-18 (×2): 500 mg via INTRAVENOUS
  Filled 2015-12-11 (×4): qty 500

## 2015-12-11 NOTE — Procedures (Signed)
I was present at this session.  I have reviewed the session itself and made appropriate changes.  HD vial LUA AVf, bp ^, slow vol off, access press ok.   Jaine Estabrooks L 4/12/20174:11 PM

## 2015-12-11 NOTE — Plan of Care (Signed)
Problem: RH Stairs Goal: LTG Patient will ambulate up and down stairs w/assist (PT) LTG: Patient will ambulate up and down # of stairs with assistance (PT) 1 step without rails with LRAD     

## 2015-12-11 NOTE — Progress Notes (Signed)
Occupational Therapy Note  Patient Details  Name: Carolyn Valenzuela MRN: [id number] Date of Birth: 04/29/1951  Today's Date: 12/11/2015 OT Individual Time: 1130-1200 OT Individual Time Calculation (min): 30 min   Pt denies pain Individual therapy  Pt resting in w/c upon arrival.  Pt engaged in BUE therex with 2kg weight ball and 2# dumb bells to increase BUE strength to assist with functional transfers and increase independence with BADLs.   Leotis Shames [facility] 12/11/2015, 12:10 PM

## 2015-12-11 NOTE — Progress Notes (Signed)
[facility] PHYSICAL MEDICINE & REHABILITATION     PROGRESS NOTE  Subjective/Complaints:  This morning. Per nursing, she had asymptomatic hypoglycemic episode his morning. She states she does better when CBGs are little higher. She also complains of left knee pain.  ROS:  + Left knee pain. Denies CP, SOB, N/V/D.  Objective: Vital Signs: Blood pressure 157/64, pulse 91, temperature 98.6 F (37 C), temperature source Oral, resp. rate 16, weight 51.2 kg (112 lb 14 oz), SpO2 98 %. No results found.  Recent Labs  12/11/15 0736  WBC 14.1*  HGB 9.9*  HCT 30.2*  PLT 462*    Recent Labs  12/09/15 1854 12/11/15 0736  NA 129* 130*  K 3.5 4.5  CL 91* 95*  GLUCOSE 91 84  BUN 27* 42*  CREATININE 6.08* 6.66*  CALCIUM 8.4* 8.8*   CBG (last 3)   Recent Labs  12/10/15 2052 12/11/15 0650 12/11/15 0720  GLUCAP 76 58* 71    Wt Readings from Last 3 Encounters:  12/11/15 51.2 kg (112 lb 14 oz)  12/04/15 51 kg (112 lb 7 oz)  11/25/15 54.885 kg (121 lb)    Physical Exam:  BP 157/64 mmHg  Pulse 91  Temp(Src) 98.6 F (37 C) (Oral)  Resp 16  Wt 51.2 kg (112 lb 14 oz)  SpO2 98% Constitutional: She appears well-developed. NAD.  HENT:  Normocephalic and atraumatic.   Eyes: Conjunctivae and EOM are normal.  Cardiovascular: Normal rate and regular rhythm.No murmur heard. Respiratory: Effort normal and breath sounds normal. No respiratory distress. She has no wheezes.  GI: Soft. Bowel sounds are normal. She exhibits no distension. There is no tenderness.  Musculoskeletal: She exhibits mild edema, no tenderness.  Bilateral BKA  Neurological: She is alert and oriented.  Motor: Bilateral upper extremities: 4+/5 proximal to distal Right lower extremity: BKA, 5/5 hip flexion  Left lower extremity: Hip flexion 4+/5 (pain inhibition)  Skin: Skin is warm and dry.  left BKA with staples C/D/I.  R-BKA healed well. Psychiatric: She has a normal mood and affect. Her behavior is normal.  Thought content normal.   Assessment/Plan: 1. Functional deficits secondary to to left BKA, with previous right BKA which require 3+ hours per day of interdisciplinary therapy in a comprehensive inpatient rehab setting. Physiatrist is providing close team supervision and 24 hour management of active medical problems listed below. Physiatrist and rehab team continue to assess barriers to discharge/monitor patient progress toward functional and medical goals.  Function:  Bathing Bathing position   Position: Wheelchair/chair at sink  Bathing parts Body parts bathed by patient: Right arm, Left arm, Chest, Abdomen, Front perineal area, Buttocks, Right upper leg, Left upper leg Body parts bathed by helper: Back  Bathing assist Assist Level: Touching or steadying assistance(Pt > 75%)   Set up : To obtain items  Upper Body Dressing/Undressing Upper body dressing   What is the patient wearing?: Bra, Pull over shirt/dress Bra - Perfomed by patient: Thread/unthread right bra strap, Thread/unthread left bra strap, Hook/unhook bra (pull down sports bra) Bra - Perfomed by helper: Thread/unthread left bra strap Pull over shirt/dress - Perfomed by patient: Thread/unthread right sleeve, Thread/unthread left sleeve, Put head through opening, Pull shirt over trunk Pull over shirt/dress - Perfomed by helper: Pull shirt over trunk        Upper body assist Assist Level: Set up   Set up : To obtain clothing/put away  Lower Body Dressing/Undressing Lower body dressing Lower body dressing/undressing activity did not occur:  Refused What is the patient wearing?: Underwear, Pants Underwear - Performed by patient: Thread/unthread right underwear leg, Thread/unthread left underwear leg, Pull underwear up/down Underwear - Performed by helper: Thread/unthread left underwear leg Pants- Performed by patient: Thread/unthread left pants leg Pants- Performed by helper: Thread/unthread right pants leg, Pull pants  up/down                      Lower body assist Assist for lower body dressing: Touching or steadying assistance (Pt > 75%)      Toileting Toileting Toileting activity did not occur: N/A (anuric)   Toileting steps completed by helper: Adjust clothing prior to toileting, Performs perineal hygiene, Adjust clothing after toileting    Toileting assist Assist level:  (helper did all tasks, total assist)   Transfers Chair/bed transfer   Chair/bed transfer method: Squat pivot Chair/bed transfer assist level: Moderate assist (Pt 50 - 74%/lift or lower) Chair/bed transfer assistive device: Armrests, Prosthesis     Locomotion Ambulation Ambulation activity did not occur: Safety/medical concerns   Max distance: 1 ft Assist level: Touching or steadying assistance (Pt > 75%)   Wheelchair   Type: Manual Max wheelchair distance: 150 Assist Level: Touching or steadying assistance (Pt > 75%)  Cognition Comprehension Comprehension assist level: Follows basic conversation/direction with no assist  Expression Expression assist level: Expresses basic needs/ideas: With no assist  Social Interaction Social Interaction assist level: Interacts appropriately with others with medication or extra time (anti-anxiety, antidepressant).  Problem Solving Problem solving assist level: Solves basic problems with no assist  Memory Memory assist level: Recognizes or recalls 90% of the time/requires cueing < 10% of the time    Medical Problem List and Plan: 1. Abnormality of gait, safety, deficits with transfers secondary to left BKA, with previous right BKA.  Continue CIR 2. DVT Prophylaxis/Anticoagulation: Pharmaceutical: Lovenox 3. Pain Management:Hydrocodone prn for pain, low dose robaxin to help with muscle spasms.  4. Mood: Very motivated to get better and resume independent living. LCSW to follow for evaluation and support.  5. Neuropsych: This patient is capable of making decisions on her  own behalf. 6. Skin/Wound Care: Routine pressure relief measures.   7. Fluids/Electrolytes/Nutrition: Labs being monitored ---hyponateremia/hyperkalemia being managed by HD, appreciate recs.  8. ESRD: On renal diet with 1200 FR. Hemodialysis after therapy sessions on MWF to help with tolerance of therapy.  9. DMT2: Monitor BS ac/hs.  Will remove diabetic retrictions to help with food choices/intake. Use SSI for elevated BS.   Home glipizide 5 mg on 4/8, will decrease to 2.5 on 4/13   Will continue to monitor. 10 HTN:   Labile at present, will not start any medication to avoid hypotension during dialysis 11. Anemia of chronic disease: Continue Aransep weekly.  12. Metabolic bone disease: On renvela.  13. Hypoxia: Improving. Encourage IS.  14. Dysphagia  Patient was scheduled to get workup prior to admission  SLP for swallowing 15. Leukocytosis, isolated fever this AM  WBCs 14.1 on 4/12  UA/U cultured pending  Will order chest x-ray 16. Urinary retention   patient had urinary retention this weekend, but this appears to have resolved and she is in anuric at present.   LOS (Days) 7 A FACE TO FACE EVALUATION WAS PERFORMED  Ankit Lorie Phenix 12/11/2015 9:43 AM

## 2015-12-11 NOTE — Progress Notes (Signed)
  [facility] Progress Note  Assessment/Plan: 1. s/p left BKA - per rehab/Duda 2. ESRD - MWF - HD today Na low 3. Anemia - stable cont ESA 4. Secondary hyperparathyroidism - P low 1.7 4/10; hold 2 renvela ac for now - resume as neede; off hectorol 4- appears it was not continued when admitted in March; correct Ca now high 10.2 - 5. HTN/volume - prior EDW pre BKA was 52 ; had UF 2 L Monday- able to UF 3 L last week- no BP meds 6. Nutrition - alb 2.2 - renal diet/prostat, vit 7. Elevated temp 100.5 - watch for trend- she looks good today, however WBC trending up  Myriam Jacobson, PA-C [place] [phone] 12/11/2015,9:17 AM  LOS: 7 days   Subjective:   No c/o working with OT bathing/dressing.  Objective Filed Vitals:   12/10/15 0555 12/10/15 1500 12/11/15 0508 12/11/15 0703  BP: 150/57 140/61 157/64   Pulse: 76 75 91   Temp: 99.3 F (37.4 C) 98.7 F (37.1 C) 100.5 F (38.1 C) 98.6 F (37 C)  TempSrc: Oral Oral Oral Oral  Resp: 18 8 16    Weight: 52.7 kg (116 lb 2.9 oz)  51.2 kg (112 lb 14 oz)   SpO2: 100% 100% 98%    Physical Exam General: NAD sitting up on side of bed Heart: RRR Gr2/6 SEM Lungs: crackles at bases Abdomen: soft NT pos bs, liver down 4 cm Extremities: right BKA, left BKA with dressing - no edema Dialysis Access: left upper AVF + bruit, superficial skin erosions on buttocks  Dialysis Orders: [place] MWF 4h 52kg preBKA 3K/2.25 bath P4 LUA AVF Hep 2500 Mircera: 50 mcg IV q 2 weeks (11/13/15) Hectoral: 4 mcg IV Q TTS (last dose 11/25/15)   Additional Objective Labs: Basic Metabolic Panel:  Recent Labs Lab 12/05/15 0910 12/06/15 1512 12/09/15 1854 12/11/15 0736  NA 132* 127* 129* 130*  K 3.7 3.9 3.5 4.5  CL 89* 87* 91* 95*  CO2 26 26 26 24   GLUCOSE 138* 219* 91 84  BUN 27* 41* 27* 42*  CREATININE 4.36* 7.01* 6.08* 6.66*  CALCIUM 8.5* 8.7* 8.4* 8.8*  PHOS 4.5 3.2 1.7*  --    Liver Function  Tests:  Recent Labs Lab 12/05/15 0910 12/06/15 1512 12/09/15 1854  ALBUMIN 2.5* 2.3* 2.2*   CBC:  Recent Labs Lab 12/05/15 0910 12/06/15 1512 12/11/15 0736  WBC 10.6* 12.6* 14.1*  NEUTROABS  --   --  PENDING  HGB 10.4* 9.6* 9.9*  HCT 31.0* 29.2* 30.2*  MCV 91.7 91.0 93.2  PLT 427* 472* 462*  CBG:  Recent Labs Lab 12/10/15 1137 12/10/15 1650 12/10/15 2052 12/11/15 0650 12/11/15 0720  GLUCAP 146* 234* 76 58* 71  Medications:   . darbepoetin (ARANESP) injection - DIALYSIS  100 mcg Intravenous Q Wed-HD  . famotidine  10 mg Oral Daily  . feeding supplement (PRO-STAT SUGAR FREE 64)  30 mL Oral TID  . gabapentin  100 mg Oral BID  . glipiZIDE  5 mg Oral QAC breakfast  . heparin subcutaneous  5,000 Units Subcutaneous 3 times per day  . insulin aspart  0-10 Units Subcutaneous TID WC & HS  . multivitamin  1 tablet Oral QHS  . senna-docusate  1 tablet Oral BID  . sevelamer carbonate  1,600 mg Oral TID WC  . simvastatin  20 mg Oral q1800

## 2015-12-11 NOTE — Progress Notes (Signed)
Physical Therapy Session Note  Patient Details  Name: Carolyn Valenzuela MRN: [id number] Date of Birth: [birth year]  Today's Date: 12/11/2015 PT Individual Time: 1345-1440 PT Individual Time Calculation (min): 55 min   Short Term Goals: Week 1:  PT Short Term Goal 1 (Week 1): bed mobility without cues or Assistance.  PT Short Term Goal 2 (Week 1): Bed chair transfer with Min A with sqaut pivot.  PT Short Term Goal 3 (Week 1): Sit to stand with RW with min A.  PT Short Term Goal 4 (Week 1): gait with RW for 75f with min A.   Skilled Therapeutic Interventions/Progress Updates:    Patient received SUpine in Bed and agreeable to PT. Patient performed bed mobility with supervision A for roll L and Supine to sit. PT instructed [facility] and constant cues for proper use of LLE and BUE. Patient reports feeling very cold and is noted to be very lethargic following transfer. PT assessed BP and oral temperature: 174/54 and 98.8 degrees F. Patient performed WC mobility to rehab gym with Supervision A from PT and constant cues for improved steering to avoid obstacles. Patient Instructed in sit<>stant x 2 with mod A from PT as well as increased cues compared to eariler treatment. Patient instructed in standing tolerance for 2 minutes, then required rest break. Following standing tolerance, Patient began shivering from cold feeling. PT assessed BP and oral temp for second time. BP 178/54, temp: 100.1 degrees F. Patient continued to demonstrate increased lethargy with difficulty staying awake durring vitals assessment. Patient returned to room and transferred to bed with mod-Max A squat pivot. RN notified of patients condition and patient left with all other needs met.   Therapy Documentation Precautions:  Precautions Precautions: Fall Precaution Comments: wound vac Required Braces or Orthoses: Other Brace/Splint Other Brace/Splint: has prosthetic for  RLE Restrictions Weight Bearing Restrictions: Yes LLE Weight Bearing: Non weight bearing Other Position/Activity Restrictions: LLE NWB  See Function Navigator for Current Functional Status.   Therapy/Group: Individual Therapy  ALorie Phenix4/07/2016, 5:59 PM

## 2015-12-11 NOTE — Patient Care Conference (Signed)
Inpatient RehabilitationTeam Conference and Plan of Care Update Date: 12/11/2015   Time: 11:05 AM    Patient Name: Carolyn Valenzuela      Medical Record Number: [id number]  Date of Birth: 1951/05/18 Sex: Female         Room/Bed: 4W22C/4W22C-01 Payor Info: Payor: [place] / Plan: [facility] PPO / Product Type: *No Product type* /    Admitting Diagnosis: L BKA  Admit Date/Time:  12/04/2015  6:02 PM Admission Comments: No comment available   Primary Diagnosis:  <principal problem not specified> Principal Problem: <principal problem not specified>  Patient Active Problem List   Diagnosis Date Noted  . Labile blood pressure   . Hyperglycemia   . SIRS (systemic inflammatory response syndrome) (HCC)   . Fever   . Hypoglycemia associated with diabetes ([place])   . Leukocytosis   . Urinary retention   . Dysphagia   . Unilateral complete BKA ([place]) 12/04/2015  . S/P bilateral BKA (below knee amputation) ([place])   . Abnormality of gait   . Muscle spasm   . ESRD on dialysis ([place])   . Type 2 diabetes mellitus with diabetic peripheral angiopathy and gangrene, without long-term current use of insulin ([place])   . Post-operative pain   . Benign essential HTN   . Metabolic bone disease   . Hypoxia   . Type 2 diabetes mellitus with peripheral neuropathy (HCC)   . Labile blood glucose   . End stage renal disease ([place])   . Postoperative pain   . Acute blood loss anemia   . Anemia of chronic disease   . Below knee amputation status ([place]) 11/29/2015  . Odynophagia 11/12/2015  . Chest pain, atypical 06/06/2015  . Chest pain 05/28/2015  . Ingrown nail 05/28/2015  . Acute pulmonary edema (HCC)   . FUO (fever of unknown origin)   . HCAP (healthcare-associated pneumonia)   . ESRD needing dialysis ([place])   . Type 2 diabetes mellitus with ESRD (end-stage renal disease) ([place]) 09/01/2014  . Anemia of renal disease 09/01/2014  . Venous insufficiency of left leg 08/27/2014  . Rash  07/06/2014  . S/P BKA (below knee amputation) ([place]) 03/20/2014  . 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 Critical lower limb ischemia 01/09/2014  . Right foot ulcer ([place]) 11/07/2013  . Toe pain 09/06/2013  . Pre-ulcerative corn or callous 09/06/2013  . Lower extremity pain 09/06/2013  . Left low back pain 12/08/2011  . Hip bursitis, left 12/08/2011  . Left leg pain 12/08/2011  . Abdominal pain, other specified site 07/08/2011  . Preventative health care 12/08/2010  . Allergic rhinitis, cause unspecified 12/08/2010  . SECONDARY HYPERPARATHYROIDISM 05/21/2009  . NUMBNESS 05/21/2009  . BACK PAIN 05/02/2009  . ANEMIA-IRON DEFICIENCY 01/25/2008  . Essential hypertension 01/25/2008  . CERVICAL RADICULOPATHY, LEFT 01/25/2008  . Hyperlipidemia 03/30/2007    Expected Discharge Date: Expected Discharge Date: 12/20/15  Team Members Present: Physician leading conference: Dr. Delice Lesch Social Worker Present: Lennart Pall, LCSW Nurse Present: Dorien Chihuahua, RN PT Present: Other (comment) Arelia Sneddon, PT and Barrie Folk, PT) OT Present: [name], OT SLP Present: Windell Moulding, SLP PPS Coordinator present : Daiva Nakayama, RN, CRRN     Current Status/Progress Goal Weekly Team Focus  Medical   Abnormality of gait, safety, deficits with transfers secondary to left BKA, with previous right BKA, fevers, leukocytosis, wounds  Improve safety, mobility  see above   Bowel/Bladder   Anuric; cont bowel  LBM 12/10/15  Remain cont x2   Encourage continual use of stool softeners and promote regular bowel movements   Swallow/Nutrition/ Hydration             ADL's   mod A transfers, A to don prosthesis, bathing EOB with lateral leans with min A  mod I for toileting and transfers, bathing supervision, dressing mod I, supervision for standing balance  activity tolerance, sit to stand, standing balance/ tolerance, transfer training, problem solving   Mobility   supervision for bed  mobility, supervision/min A for w/c mobility to avoid objects, min/mod A for squat pivot transfers, mod A sit-to-stand  Mod I bed mobility, supervision for bed<>chair & car transfers, Mod I w/c mobility  transfers, wrapping residual limb, w/c mobility/endurance   Communication             Safety/Cognition/ Behavioral Observations            Pain   Occasional use of PRN pain med  < 4 on 0-10 pain med  Continue monitoring pain pt pain, and pre medicate for therapy as needed   Skin   L stump with staples and daily dressing change; foam on bottom prophylactically   Min assist with dressing changes  Continue educating patient on wrapping stump with dressing change.       *See Care Plan and progress notes for long and short-term goals.  Barriers to Discharge: DM with hypoglycemia, HTN, wounds, fevers, leukocytosis    Possible Resolutions to Barriers:  [place] nurse, optimise DM meds, optimize HTN meds, workup leukocytosis    Discharge Planning/Teaching Needs:  Home with intermittent assist from sister's, will not have 24 hr supervision. Aware when came to rehab could not pursue NH after due to [facility] would not cover. Pt wants power chair-PT to address      Team Discussion:  Team feels very strongly that pt will need 24/7 supervision, however, family not committing to providing this.  SW aware and to follow up.  Now with skin issues developing - MD aware and referral made for [place] RN.  ?need for air mattress.  No mental flexibility for safety problem solving.  Revisions to Treatment Plan:  None   Continued Need for Acute Rehabilitation Level of Care: The patient requires daily medical management by a physician with specialized training in physical medicine and rehabilitation for the following conditions: Daily direction of a multidisciplinary physical rehabilitation program to ensure safe treatment while eliciting the highest outcome that is of practical value to the patient.: Yes Daily  medical management of patient stability for increased activity during participation in an intensive rehabilitation regime.: Yes Daily analysis of laboratory values and/or radiology reports with any subsequent need for medication adjustment of medical intervention for : Post surgical problems;Diabetes problems;Wound care problems;Blood pressure problems;Renal problems  Rodricus Candelaria 12/12/2015, 8:59 AM

## 2015-12-11 NOTE — Plan of Care (Signed)
Problem: Food- and Nutrition-Related Knowledge Deficit (NB-1.1) Goal: Nutrition education Formal process to instruct or train a patient/client in a skill or to impart knowledge to help patients/clients voluntarily manage or modify food choices and eating behavior to maintain or improve health. Outcome: Completed/Met Date Met:  12/11/15 Nutrition Education Note  RD consulted for Renal Education. Provided "Eating Healthy with Kidney Disease" handout to patient. Reviewed food groups and provided written recommended serving sizes specifically determined for patient's current nutritional status.   Explained why diet restrictions are needed and provided lists of foods to limit/avoid that are high potassium, sodium, and phosphorus. Provided specific recommendations on safer alternatives of these foods. Strongly encouraged compliance of this diet. Pt reports eating out a lot PTA as she does not cook much at home. Discussed healthier food options she can eat when going out.    Discussed importance of protein intake at each meal and snack. Provided examples of how to maximize protein intake throughout the day. Discussed need for fluid restriction with dialysis and renal-friendly beverage options. Teach back method used.  Expect fair compliance.  Corrin Parker, MS, RD, LDN Pager # [phone] After hours/ weekend pager # [phone]

## 2015-12-11 NOTE — Progress Notes (Signed)
ANTIBIOTIC CONSULT NOTE - INITIAL  Pharmacy Consult for vancomycin and zosyn Indication: sepsis  Allergies  Allergen Reactions  . Oxycodone Itching    Patient Measurements: Weight: 112 lb 14 oz (51.2 kg) Adjusted Body Weight:   Vital Signs: Temp: 98.6 F (37 C) (04/12 0703) Temp Source: Oral (04/12 0703) BP: 157/64 mmHg (04/12 0508) Pulse Rate: 91 (04/12 0508) Intake/Output from previous day: 04/11 0701 - 04/12 0700 In: 360 [P.O.:360] Out: -  Intake/Output from this shift: Total I/O In: 240 [P.O.:240] Out: -   Labs:  Recent Labs  12/09/15 1854 12/11/15 0736  WBC  --  14.1*  HGB  --  9.9*  PLT  --  462*  CREATININE 6.08* 6.66*   Estimated Creatinine Clearance: 5.9 mL/min (by C-G formula based on Cr of 6.66). No results for input(s): VANCOTROUGH, VANCOPEAK, VANCORANDOM, GENTTROUGH, GENTPEAK, GENTRANDOM, TOBRATROUGH, TOBRAPEAK, TOBRARND, AMIKACINPEAK, AMIKACINTROU, AMIKACIN in the last 72 hours.   Microbiology: Recent Results (from the past 720 hour(s))  MRSA PCR Screening     Status: None   Collection Time: 12/07/15  9:12 AM  Result Value Ref Range Status   MRSA by PCR NEGATIVE NEGATIVE Final    Comment:        The GeneXpert MRSA Assay (FDA approved for NASAL specimens only), is one component of a comprehensive MRSA colonization surveillance program. It is not intended to diagnose MRSA infection nor to guide or monitor treatment for MRSA infections.     Medical History: Past Medical History  Diagnosis Date  . DIABETES MELLITUS, UNCONTROLLED 05/21/2009  . HYPERLIPIDEMIA 03/30/2007  . ANEMIA-IRON DEFICIENCY 01/25/2008  . HYPERTENSION 01/25/2008  . SINUSITIS- ACUTE-NOS 01/25/2008  . SECONDARY HYPERPARATHYROIDISM 05/21/2009  . CERVICAL RADICULOPATHY, LEFT 01/25/2008  . BACK PAIN 05/02/2009  . NUMBNESS 05/21/2009  . Allergic rhinitis, cause unspecified 12/08/2010  . Foot ulcer ([place])     right lateral malleolus  . Critical lower limb ischemia   . RENAL  INSUFFICIENCY 02/01/2008    HD pt.    Medications:  Scheduled:  . darbepoetin (ARANESP) injection - DIALYSIS  100 mcg Intravenous Q Wed-HD  . famotidine  10 mg Oral Daily  . feeding supplement (NEPRO CARB STEADY)  237 mL Oral TID WC  . gabapentin  100 mg Oral BID  . [START ON 12/12/2015] glipiZIDE  2.5 mg Oral QAC breakfast  . heparin subcutaneous  5,000 Units Subcutaneous 3 times per day  . insulin aspart  0-10 Units Subcutaneous TID WC & HS  . lidocaine  1 application Urethral Once  . multivitamin  1 tablet Oral QHS  . senna-docusate  1 tablet Oral BID  . simvastatin  20 mg Oral q1800   Infusions:   Assessment: 65 yo female will be started on vancomycin and zosyn for r/o sepsis.  Patient has ESRD on HD MWF.  Will have HD now  Goal of Therapy:  Pre- HD Vancomycin level is 15-25   Plan:  - zosyn 2.25 g iv q8h  - vancomycin 1g iv load with HD, then 500 mg iv qHD - follow culture when available  Baani Bober, Tsz-Yin 12/11/2015,3:11 PM

## 2015-12-11 NOTE — Progress Notes (Signed)
Occupational Therapy Session Note  Patient Details  Name: Carolyn Valenzuela MRN: [id number] Date of Birth: Jun 06, 1951  Today's Date: 12/11/2015 OT Individual Time: [id number] OT Individual Time Calculation (min): 45 min    Short Term Goals: Week 1:  OT Short Term Goal 1 (Week 1): Complete toileting with min assist OT Short Term Goal 2 (Week 1): Complete toilet transfer with setup and supervision OT Short Term Goal 3 (Week 1): Demo ability to retreieve and prepare simple meal at w/c level OT Short Term Goal 4 (Week 1): Complete lower body bathing with min assist OT Short Term Goal 5 (Week 1): Doff and don pants seated using lateral leans and AE, prn  Skilled Therapeutic Interventions/Progress Updates:    1:1 self care retraining at EOB with focus on performing bathing and dressing after setup.  Pt performs lateral leans for LB clothing management EOB. Tried to problem solve bathing in the kitchen at night time however pt with difficulty problem solving a different way to be more independent but reports she knows she will need help. Pt with two new open sores on her bottom noted in session.  Called RN in to take note. Pt requires more than reasonable time to perform all self care tasks and often needs cues to perform tasks in a timely manner. Pt reports vision is not great and does impact her function this am.  Pt did require A to don prosthesis today prior to transfer. Pt perform bed to w/c transfer with mod A with VC for sequence. Pt then transferred back to bed for PA to assess pt's backside.  Therapy Documentation Precautions:  Precautions Precautions: Fall Precaution Comments: wound vac Required Braces or Orthoses: Other Brace/Splint Other Brace/Splint: has prosthetic for RLE Restrictions Weight Bearing Restrictions: Yes LLE Weight Bearing: Non weight bearing Other Position/Activity Restrictions: LLE NWB Pain: 8/10 phantom pain  ADL: ADL ADL Comments: see Functional Assessment  Tool  See Function Navigator for Current Functional Status.   Therapy/Group: Individual Therapy  [name] [facility] 12/11/2015, 9:25 AM

## 2015-12-11 NOTE — Significant Event (Signed)
Hypoglycemic Event  CBG: 58 @ 0650  Treatment: 15 GM carbohydrate snack  Symptoms: None  Follow-up CBG: Time:0720 CBG Result:71  Possible Reasons for Event: Inadequate meal intake  Comments/MD notified:Patient given snack HS with CBG 76 to attempt AM CBG from dropping.    Newman Nickels

## 2015-12-11 NOTE — Progress Notes (Signed)
Patient with two dime size abraded areas in gluteal cleft--surrounded by dark redundant tissue. Has an additional area on right posterior thigh. Cover with EPC cream and foam dressing to prevent shearing.  She is concerned about her BS dropping--gets weak, shaky and lightheaded if BS < 150. She has lost a lot of weight in the past year--encouraged po intake and compliance with nutritional supplement to help with BS stabilization as well as wound healing.

## 2015-12-11 NOTE — Progress Notes (Signed)
Physical Therapy Session Note  Patient Details  Name: Carolyn Valenzuela MRN: [id number] Date of Birth: [birth year]  Today's Date: 12/11/2015 PT Individual Time:  1001-1100 PT Individual Time Calculation (min): 59 min   Short Term Goals: Week 1:  PT Short Term Goal 1 (Week 1): bed mobility without cues or Assistance.  PT Short Term Goal 2 (Week 1): Bed chair transfer with Min A with sqaut pivot.  PT Short Term Goal 3 (Week 1): Sit to stand with RW with min A.  PT Short Term Goal 4 (Week 1): gait with RW for 5ft with min A.   Skilled Therapeutic Interventions/Progress Updates:    Pt received sitting on EOB with NT & PA present. PT offered to assist pt bed>w/c & pt able to complete transfer with Min A while demonstrating proper hand placement & sequencing without cuing from PT. Pt reported having wound on her bottom; PA returned to room and pt transferred sit<>stand with Min A & cuing to push up from armrests of w/c and hold on to armrests prior to sitting. Pt able to tolerate standing 1 minute 30 seconds with steady A to allow PA to apply dressing to wound on buttocks. Pt reported fatigue ("sort of exhausted") after task & required seated rest break. Pt able to manage L armrest of w/c without cuing/assistance but required supervision & moderate verbal & visual cuing to attach amputee support pad & R leg rest to w/c. Pt had difficulty aligning holes & pins on leg rests; pt able to maneuver w/c in small space of room to turn around. Pt requested to eat breakfast & during that time PT & pt discussed home set up, plans with family, and d/c plan. Pt reports she hopes she does not have to return home by herself and is looking for someone who will be able to stay with her. PT suggested pt talk with family, friends & church members but pt does not seem hopeful with these suggestions. Pt able to self propel 150 ft room>gym with supervision and PT positioned in parallel bars to attempt standing & hopping. Pt became  very emotional & tearful, commenting on not having available help upon d/c. PT provided emotional listening & support with pt reporting she may have a friend she can stay with after d/c. PT encouraged pt to follow up on this. PT provided pt with different cushion to help relieve pressure on buttocks; pt able to transfer sit<>stand by pulling up on parallel bars to allow PT to switch cushion. Pt then returned to room via w/c total A for time management & at end of session pt left in w/c with all needs within reach.   Therapy Documentation Precautions:  Precautions Precautions: Fall Required Braces or Orthoses: Other Brace/Splint Other Brace/Splint: has prosthetic for RLE Restrictions Weight Bearing Restrictions: Yes LLE Weight Bearing: Non weight bearing Other Position/Activity Restrictions: LLE NWB  Pain: Pain Assessment Pain Assessment: 0-10 Pain Score: 8  Pain Type: Acute pain;Surgical pain Pain Location:  (residual limb; distal portion) Pain Orientation: Left Pain Intervention(s): RN made aware;Repositioned;Ambulation/increased activity   See Function Navigator for Current Functional Status.   Therapy/Group: Individual Therapy  [name] 12/11/2015, 10:05 AM

## 2015-12-11 NOTE — Progress Notes (Signed)
Nutrition Follow-up  DOCUMENTATION CODES:   Not applicable  INTERVENTION:  Continue Nepro Shake po TID with meals, each supplement provides 425 kcal and 19 grams protein.  Discontinue Prostat.  Provide nourishment snacks (Ordered).  Encourage adequate PO intake.   Renal diet handout given.   NUTRITION DIAGNOSIS:   Increased nutrient needs related to chronic illness as evidenced by estimated needs; ongoing  GOAL:   Patient will meet greater than or equal to 90% of their needs; met  MONITOR:   PO intake, Supplement acceptance, Weight trends, Labs, I & O's, Skin  REASON FOR ASSESSMENT:   Consult  (snacks)  ASSESSMENT:   65 y.o. 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 She was admitted on 11/29/15 for L-BKA by Dr. Sharol Given.Continuing Prevana wound VAC for 1 week post surgery.  RD consulted for order HS snacks. Snacks have been ordered. Discussed with PA the need for pt to be educated on a renal diet. Renal diet education given. Pt current has Nepro Shake and Prostat ordered TID. Meal completion has been 80-100%. Intake has improved. RD to discontinue Prostat as Nepro Shake has been ordered instead. RD to continue to monitor.   Labs and medications reviewed.   Diet Order:  Diet renal with fluid restriction Fluid restriction:: 1200 mL Fluid; Room service appropriate?: Yes; Fluid consistency:: Thin  Skin:   (Bilateral BKA)  Last BM:  4/11  Height:   Ht Readings from Last 1 Encounters:  11/29/15 4' 11" (1.499 m)    Weight:   Wt Readings from Last 1 Encounters:  12/11/15 112 lb 14 oz (51.2 kg)    Ideal Body Weight:  38.8 kg (adjusted for bilateral BKA)  BMI:  Body mass index is 22.79 kg/(m^2).  Estimated Nutritional Needs:   Kcal:  1450-1650  Protein:  65-75 grams  Fluid:   1.2 L/day  EDUCATION NEEDS:   No education needs identified at this time  Corrin Parker, MS, RD, LDN Pager # [phone] After hours/ weekend pager # [phone]

## 2015-12-12 ENCOUNTER — Inpatient Hospital Stay (HOSPITAL_COMMUNITY): Payer: BC Managed Care – PPO | Admitting: Occupational Therapy

## 2015-12-12 ENCOUNTER — Encounter (HOSPITAL_COMMUNITY): Payer: BC Managed Care – PPO | Admitting: Occupational Therapy

## 2015-12-12 ENCOUNTER — Inpatient Hospital Stay (HOSPITAL_COMMUNITY): Payer: BC Managed Care – PPO | Admitting: Physical Therapy

## 2015-12-12 DIAGNOSIS — R6510 Systemic inflammatory response syndrome (SIRS) of non-infectious origin without acute organ dysfunction: Secondary | ICD-10-CM | POA: Insufficient documentation

## 2015-12-12 DIAGNOSIS — R030 Elevated blood-pressure reading, without diagnosis of hypertension: Secondary | ICD-10-CM

## 2015-12-12 DIAGNOSIS — R739 Hyperglycemia, unspecified: Secondary | ICD-10-CM

## 2015-12-12 DIAGNOSIS — R0989 Other specified symptoms and signs involving the circulatory and respiratory systems: Secondary | ICD-10-CM | POA: Insufficient documentation

## 2015-12-12 LAB — CBC WITH DIFFERENTIAL/PLATELET
Basophils Absolute: 0 10*3/uL (ref 0.0–0.1)
Basophils Absolute: 0 10*3/uL (ref 0.0–0.1)
Basophils Relative: 0 %
Basophils Relative: 0 %
Eosinophils Absolute: 0 10*3/uL (ref 0.0–0.7)
Eosinophils Absolute: 0 10*3/uL (ref 0.0–0.7)
Eosinophils Relative: 0 %
Eosinophils Relative: 0 %
HCT: 28 % — ABNORMAL LOW (ref 36.0–46.0)
HCT: 28.6 % — ABNORMAL LOW (ref 36.0–46.0)
Hemoglobin: 9.3 g/dL — ABNORMAL LOW (ref 12.0–15.0)
Hemoglobin: 9 g/dL — ABNORMAL LOW (ref 12.0–15.0)
Lymphocytes Relative: 6 %
Lymphocytes Relative: 8 %
Lymphs Abs: 1.1 10*3/uL (ref 0.7–4.0)
Lymphs Abs: 1.5 10*3/uL (ref 0.7–4.0)
MCH: 30.9 pg (ref 26.0–34.0)
MCH: 29.3 pg (ref 26.0–34.0)
MCHC: 33.2 g/dL (ref 30.0–36.0)
MCHC: 31.5 g/dL (ref 30.0–36.0)
MCV: 93 fL (ref 78.0–100.0)
MCV: 93.2 fL (ref 78.0–100.0)
Monocytes Absolute: 1.1 10*3/uL — ABNORMAL HIGH (ref 0.1–1.0)
Monocytes Absolute: 0.9 10*3/uL (ref 0.1–1.0)
Monocytes Relative: 6 %
Monocytes Relative: 5 %
Neutro Abs: 16.4 10*3/uL — ABNORMAL HIGH (ref 1.7–7.7)
Neutro Abs: 15.4 10*3/uL — ABNORMAL HIGH (ref 1.7–7.7)
Neutrophils Relative %: 88 %
Neutrophils Relative %: 87 %
Platelets: 278 10*3/uL (ref 150–400)
Platelets: 294 10*3/uL (ref 150–400)
RBC: 3.01 MIL/uL — ABNORMAL LOW (ref 3.87–5.11)
RBC: 3.07 MIL/uL — ABNORMAL LOW (ref 3.87–5.11)
RDW: 14.8 % (ref 11.5–15.5)
RDW: 14.8 % (ref 11.5–15.5)
WBC: 18.6 10*3/uL — ABNORMAL HIGH (ref 4.0–10.5)
WBC: 17.8 10*3/uL — ABNORMAL HIGH (ref 4.0–10.5)

## 2015-12-12 LAB — COMPREHENSIVE METABOLIC PANEL
ALT: 10 U/L — ABNORMAL LOW (ref 14–54)
AST: 35 U/L (ref 15–41)
Albumin: 2.3 g/dL — ABNORMAL LOW (ref 3.5–5.0)
Alkaline Phosphatase: 90 U/L (ref 38–126)
Anion gap: 13 (ref 5–15)
BUN: 22 mg/dL — ABNORMAL HIGH (ref 6–20)
CO2: 26 mmol/L (ref 22–32)
Calcium: 8 mg/dL — ABNORMAL LOW (ref 8.9–10.3)
Chloride: 92 mmol/L — ABNORMAL LOW (ref 101–111)
Creatinine, Ser: 3.58 mg/dL — ABNORMAL HIGH (ref 0.44–1.00)
GFR calc Af Amer: 15 mL/min — ABNORMAL LOW (ref >60)
GFR calc non Af Amer: 13 mL/min — ABNORMAL LOW (ref >60)
Glucose, Bld: 403 mg/dL — ABNORMAL HIGH (ref 65–99)
Potassium: 3.2 mmol/L — ABNORMAL LOW (ref 3.5–5.1)
Sodium: 131 mmol/L — ABNORMAL LOW (ref 135–145)
Total Bilirubin: 0.4 mg/dL (ref 0.3–1.2)
Total Protein: 6.4 g/dL — ABNORMAL LOW (ref 6.5–8.1)

## 2015-12-12 LAB — APTT: aPTT: 44 seconds — ABNORMAL HIGH (ref 24–37)

## 2015-12-12 LAB — GLUCOSE, CAPILLARY
Glucose-Capillary: 397 mg/dL — ABNORMAL HIGH (ref 65–99)
Glucose-Capillary: 275 mg/dL — ABNORMAL HIGH (ref 65–99)
Glucose-Capillary: 268 mg/dL — ABNORMAL HIGH (ref 65–99)
Glucose-Capillary: 251 mg/dL — ABNORMAL HIGH (ref 65–99)
Glucose-Capillary: 95 mg/dL (ref 65–99)
Glucose-Capillary: 275 mg/dL — ABNORMAL HIGH (ref 65–99)

## 2015-12-12 LAB — LACTIC ACID, PLASMA
Lactic Acid, Venous: 2.4 mmol/L (ref 0.5–2.0)
Lactic Acid, Venous: 2.8 mmol/L (ref 0.5–2.0)

## 2015-12-12 LAB — PROCALCITONIN: Procalcitonin: 27.05 ng/mL

## 2015-12-12 LAB — PROTIME-INR
INR: 1.22 (ref 0.00–1.49)
Prothrombin Time: 15.6 seconds — ABNORMAL HIGH (ref 11.6–15.2)

## 2015-12-12 NOTE — Significant Event (Signed)
CRITICAL VALUE ALERT  Critical value received: Lactic acid 2.4  Date of notification:  12/12/15  Time of notification:  0215  Critical value read back:Yes.    Nurse who received alert:  Glenford Peers, RN  MD notified (1st page):  Algis Liming, PA  Time of first page:  0221  MD notified (2nd page):   Time of second page:  Responding MD:  Algis Liming, PA  Time MD responded:  [phone]

## 2015-12-12 NOTE — Progress Notes (Signed)
Posey Pronto, MD on floor updated about patient condition. No new orders received at this time. Will continue to monitor.

## 2015-12-12 NOTE — Progress Notes (Signed)
Subjective: Interval History: has no complaint .  Objective: Vital signs in last 24 hours: Temp:  [98.4 F (36.9 C)-102.5 F (39.2 C)] 98.4 F (36.9 C) (04/13 0455) Pulse Rate:  [72-126] 72 (04/13 0455) Resp:  [14-22] 14 (04/13 0455) BP: (101-220)/(31-91) 115/48 mmHg (04/13 0455) SpO2:  [94 %-100 %] 100 % (04/13 0455) Weight:  [51.1 kg (112 lb 10.5 oz)-52.9 kg (116 lb 10 oz)] 51.1 kg (112 lb 10.5 oz) (04/13 0455) Weight change: 1.7 kg (3 lb 12 oz)  Intake/Output from previous day: 04/12 0701 - 04/13 0700 In: 240 [P.O.:240] Out: 1613  Intake/Output this shift:    General appearance: alert, cooperative and no distress Resp: clear to auscultation bilaterally Cardio: S1, S2 normal and systolic murmur: systolic ejection 2/6, decrescendo at 2nd left intercostal space GI: pos bs, liver down 3 cm Extremities: Bilat BKA, New stump with staples on L,  AVF LUA  Lab Results:  Recent Labs  12/11/15 2359 12/12/15 0429  WBC 18.6* 17.8*  HGB 9.3* 9.0*  HCT 28.0* 28.6*  PLT 278 294   BMET:  Recent Labs  12/11/15 0736 12/11/15 2359  NA 130* 131*  K 4.5 3.2*  CL 95* 92*  CO2 24 26  GLUCOSE 84 403*  BUN 42* 22*  CREATININE 6.66* 3.58*  CALCIUM 8.8* 8.0*   No results for input(s): PTH in the last 72 hours. Iron Studies: No results for input(s): IRON, TIBC, TRANSFERRIN, FERRITIN in the last 72 hours.  Studies/Results: Dg Chest 2 View  12/11/2015  CLINICAL DATA:  Fever for 2 days. EXAM: CHEST  2 VIEW COMPARISON:  12/03/2015 FINDINGS: Mild linear scarring noted at the left lung base, stable. Lungs are otherwise clear. No pleural effusion or pneumothorax. Heart is mildly enlarged. No mediastinal or hilar masses or evidence of adenopathy. Bony thorax is intact. No change from prior study. IMPRESSION: No active cardiopulmonary disease. Electronically Signed   By: Lajean Manes M.D.   On: 12/11/2015 13:45    I have reviewed the patient's current medications.  Assessment/Plan: 1  ESRD HD yest, will do in am. Vol ok  2 Anemia low but on ESA, Fe 3 HPTH 4 PVD per ortho/rehab 5 DM controlled P HD, esa, vit D, rehab    LOS: 8 days   Darcel Zick L 12/12/2015,7:52 AM

## 2015-12-12 NOTE — Progress Notes (Signed)
Patient returned from HD, vitals as follows: temp 102.5, BP 101/41, resp 20, O2 100, pain 8/10 (Norco 2 tab given in HD 1942).  Complaints of constipation. CBG 362, 10 units given. On call PA made aware of vitals, CBG, and constipation. Code sepsis called as patient met all qualifications and orders put in.  Algis Liming, PA recommended q1h vitals until temp <100.0 and as long as BP holding then q4h, ordered to check CBG before eating (397) and again at 0400 and a tap water enema/disimpaction.  Large stool return from disimpaction and enema, patient stated she feels much better.  PA notified of critical lactic x2.  Ordered to recheck CBC with diff in AM by PA.  Will continue to monitor patient.

## 2015-12-12 NOTE — Significant Event (Signed)
CRITICAL VALUE ALERT  Critical value received:  Lactic acid 2.8  Date of notification:  12/12/2015  Time of notification:  0049  Critical value read back:Yes.    Nurse who received alert:  Landis Martins, RN   MD notified (1st page):  Algis Liming, PA   Time of first page:  [phone]  MD notified (2nd page): N/A  Time of second page:  Responding MD:  Algis Liming, PA  Time MD responded:  [id number]  Algis Liming, PA notified by primary RN Candace Cruise.

## 2015-12-12 NOTE — Progress Notes (Signed)
Social Work Patient ID: Carolyn Valenzuela, female   DOB: 11-17-1950, 65 y.o.   MRN: [id number]   Have reviewed team conference with pt and she is aware and agreeable with targeted d/c date of 4/21.  Have stressed that team is recommending 24/7 supervision and pt reports that her family cannot provide this.  Reviewed what services will be covered under her insurance and discuss any additional services will be out of pocket for her.  She reports that she is going to "keep talking to people" to see if she can get assistance arranged.  Becky Dupree, LCSW to follow up with her upon her return next week.  Cruz Devilla, LCSW

## 2015-12-12 NOTE — Progress Notes (Signed)
Occupational Therapy Session Note  Patient Details  Name: CADYNCE WOLLMAN MRN: [id number] Date of Birth: 1950-12-27  Today's Date: 12/12/2015 OT Individual Time: 0845-1000 OT Individual Time Calculation (min): 75 min    Short Term Goals: Week 1:  OT Short Term Goal 1 (Week 1): Complete toileting with min assist OT Short Term Goal 2 (Week 1): Complete toilet transfer with setup and supervision OT Short Term Goal 3 (Week 1): Demo ability to retreieve and prepare simple meal at w/c level OT Short Term Goal 4 (Week 1): Complete lower body bathing with min assist OT Short Term Goal 5 (Week 1): Doff and don pants seated using lateral leans and AE, prn  Skilled Therapeutic Interventions/Progress Updates:    Pt seen for OT ADL bathing/dressing session. Pt in supine upon arrival, voicing feeling a lot better than last night. Pt agreeable to bathing/dressing from EOB. She completed with set-up assist, completing lateral leans for buttock hygiene. She returned to supine for therapist to assist with thorough buttock hygiene due to wounds and barrier cream applied per RN order. She dressed in supine, rolling using bed rails to assist and pulled pants up. Rest breaks required throughout. She required min A for securing tight fit on prosthetic. Min squat pivot transfer completed into w/c. She voiced need for toileting task and completed min squat pivot w/c <> toilet utilizing grab bars with VCs for technique. . She completed lateral leans on toilet for clothing management to pull pants down and part of the way up,l requiring assist to pull pants up fully. Noted pt with blood seeping from sutures on L LE. RN made aware to apply bandage. Pt tearful when seeing blood and emotional support provided.  Pt left sitting at sink completing grooming tasks with RN present.   Therapy Documentation Precautions:  Precautions Precautions: Fall Precaution Comments: wound vac Required Braces or Orthoses: Other  Brace/Splint Other Brace/Splint: has prosthetic for RLE Restrictions Weight Bearing Restrictions: Yes LLE Weight Bearing: Non weight bearing Other Position/Activity Restrictions: LLE NWB Pain:   No/denis pain ADL: ADL ADL Comments: see Functional Assessment Tool  See Function Navigator for Current Functional Status.   Therapy/Group: Individual Therapy  Lewis, Demi Trieu C 12/12/2015, 9:15 AM

## 2015-12-12 NOTE — Progress Notes (Signed)
Physical Therapy Session Note  Patient Details  Name: Carolyn Valenzuela MRN: [id number] Date of Birth: [birth year]  Today's Date: 12/12/2015 PT Individual Time: 1000-1100 PT Individual Time Calculation (min): 60 min   Short Term Goals: Week 1:  PT Short Term Goal 1 (Week 1): bed mobility without cues or Assistance.  PT Short Term Goal 2 (Week 1): Bed chair transfer with Min A with sqaut pivot.  PT Short Term Goal 3 (Week 1): Sit to stand with RW with min A.  PT Short Term Goal 4 (Week 1): gait with RW for 13f with min A.   Skilled Therapeutic Interventions/Progress Updates:    Pt received resting in w/c, agreeable to therapy session.  Dynamic sitting balance for self care grooming tasks at sink with verbal cues for locking w/c brakes for stability.  Stand/pivot with max assist and RW to recliner to simulate transfers at HD.  Pt finished eating breakfast in recliner while engaged in conversation about d/c plans and available assist.  Squat/pivot back to w/c with mod assist and pt engaged in community level w/c propulsion.  Pt negotiated 2 ramps with close supervision and verbal cues to "brake" when coming down the ramp.  Pt returned to room at end of session and positioned in bed with mod assist for squat/pivot and supervision for bed mobility.  Pt left with call bell in reach and needs met.   Therapy Documentation Precautions:  Precautions Precautions: Fall Precaution Comments: wound vac Required Braces or Orthoses: Other Brace/Splint Other Brace/Splint: has prosthetic for RLE Restrictions Weight Bearing Restrictions: Yes LLE Weight Bearing: Non weight bearing Other Position/Activity Restrictions: LLE NWB   See Function Navigator for Current Functional Status.   Therapy/Group: Individual Therapy  CEarnest ConroyPenven-Crew 12/12/2015, 12:23 PM

## 2015-12-12 NOTE — Progress Notes (Signed)
[place] PHYSICAL MEDICINE & REHABILITATION     PROGRESS NOTE  Subjective/Complaints:  Pt laying in bed this AM.  She notes she had a bad day yesterday, but slept well overnight and feels better today.    ROS:  Denies CP, SOB, N/V/D.  Objective: Vital Signs: Blood pressure 115/48, pulse 72, temperature 98.4 F (36.9 C), temperature source Oral, resp. rate 14, weight 51.1 kg (112 lb 10.5 oz), SpO2 100 %. Dg Chest 2 View  12/11/2015  CLINICAL DATA:  Fever for 2 days. EXAM: CHEST  2 VIEW COMPARISON:  12/03/2015 FINDINGS: Mild linear scarring noted at the left lung base, stable. Lungs are otherwise clear. No pleural effusion or pneumothorax. Heart is mildly enlarged. No mediastinal or hilar masses or evidence of adenopathy. Bony thorax is intact. No change from prior study. IMPRESSION: No active cardiopulmonary disease. Electronically Signed   By: Lajean Manes M.D.   On: 12/11/2015 13:45    Recent Labs  12/11/15 2359 12/12/15 0429  WBC 18.6* 17.8*  HGB 9.3* 9.0*  HCT 28.0* 28.6*  PLT 278 294    Recent Labs  12/11/15 0736 12/11/15 2359  NA 130* 131*  K 4.5 3.2*  CL 95* 92*  GLUCOSE 84 403*  BUN 42* 22*  CREATININE 6.66* 3.58*  CALCIUM 8.8* 8.0*   CBG (last 3)   Recent Labs  12/11/15 2301 12/12/15 0415 12/12/15 0651  GLUCAP 397* 275* 268*    Wt Readings from Last 3 Encounters:  12/12/15 51.1 kg (112 lb 10.5 oz)  12/04/15 51 kg (112 lb 7 oz)  11/25/15 54.885 kg (121 lb)    Physical Exam:  BP 115/48 mmHg  Pulse 72  Temp(Src) 98.4 F (36.9 C) (Oral)  Resp 14  Wt 51.1 kg (112 lb 10.5 oz)  SpO2 100% Constitutional: She appears well-developed. NAD.  HENT:  Normocephalic and atraumatic.   Eyes: Conjunctivae and EOM are normal.  Cardiovascular: Normal rate and regular rhythm.No murmur heard. Respiratory: Effort normal and breath sounds normal. No respiratory distress. She has no wheezes.  GI: Soft. Bowel sounds are normal. She exhibits no distension. There is  no tenderness.  Musculoskeletal: She exhibits mild edema, no tenderness.  Bilateral BKA  Neurological: She is alert and oriented.  Motor: Bilateral upper extremities: 4+/5 proximal to distal Right lower extremity: BKA, 5/5 hip flexion  Left lower extremity: Hip flexion 4+/5 (pain inhibition)  Skin: Skin is warm and dry.  left BKA with staples C/D/I.  R-BKA healed well. Psychiatric: She has a normal mood and affect. Her behavior is normal. Thought content normal.   Assessment/Plan: 1. Functional deficits secondary to to left BKA, with previous right BKA which require 3+ hours per day of interdisciplinary therapy in a comprehensive inpatient rehab setting. Physiatrist is providing close team supervision and 24 hour management of active medical problems listed below. Physiatrist and rehab team continue to assess barriers to discharge/monitor patient progress toward functional and medical goals.  Function:  Bathing Bathing position   Position: Wheelchair/chair at sink  Bathing parts Body parts bathed by patient: Right arm, Left arm, Chest, Abdomen, Front perineal area, Buttocks, Right upper leg, Left upper leg Body parts bathed by helper: Back  Bathing assist Assist Level: Touching or steadying assistance(Pt > 75%)   Set up : To obtain items  Upper Body Dressing/Undressing Upper body dressing   What is the patient wearing?: Bra, Pull over shirt/dress Bra - Perfomed by patient: Thread/unthread right bra strap, Thread/unthread left bra strap, Hook/unhook bra (pull  down sports bra) Bra - Perfomed by helper: Thread/unthread left bra strap Pull over shirt/dress - Perfomed by patient: Thread/unthread right sleeve, Thread/unthread left sleeve, Put head through opening, Pull shirt over trunk Pull over shirt/dress - Perfomed by helper: Pull shirt over trunk        Upper body assist Assist Level: Set up   Set up : To obtain clothing/put away  Lower Body Dressing/Undressing Lower body  dressing Lower body dressing/undressing activity did not occur: Refused What is the patient wearing?: Underwear, Pants Underwear - Performed by patient: Thread/unthread right underwear leg, Thread/unthread left underwear leg, Pull underwear up/down Underwear - Performed by helper: Thread/unthread left underwear leg Pants- Performed by patient: Thread/unthread left pants leg Pants- Performed by helper: Thread/unthread right pants leg, Pull pants up/down                      Lower body assist Assist for lower body dressing: Touching or steadying assistance (Pt > 75%)      Toileting Toileting Toileting activity did not occur: N/A (anuric)   Toileting steps completed by helper: Adjust clothing prior to toileting, Performs perineal hygiene, Adjust clothing after toileting    Toileting assist Assist level:  (helper did all tasks, total assist)   Transfers Chair/bed transfer   Chair/bed transfer method: Squat pivot Chair/bed transfer assist level: Moderate assist (Pt 50 - 74%/lift or lower) Chair/bed transfer assistive device: Armrests     Locomotion Ambulation Ambulation activity did not occur: Safety/medical concerns   Max distance: 1 ft Assist level: Touching or steadying assistance (Pt > 75%)   Wheelchair   Type: Manual Max wheelchair distance: 100 Assist Level: Supervision or verbal cues  Cognition Comprehension Comprehension assist level: Follows basic conversation/direction with no assist  Expression Expression assist level: Expresses basic needs/ideas: With no assist  Social Interaction Social Interaction assist level: Interacts appropriately with others with medication or extra time (anti-anxiety, antidepressant).  Problem Solving Problem solving assist level: Solves basic problems with no assist  Memory Memory assist level: Recognizes or recalls 90% of the time/requires cueing < 10% of the time    Medical Problem List and Plan: 1. Abnormality of gait, safety,  deficits with transfers secondary to left BKA, with previous right BKA.  Continue CIR 2. DVT Prophylaxis/Anticoagulation: Pharmaceutical: Lovenox 3. Pain Management:Hydrocodone prn for pain, low dose robaxin to help with muscle spasms.  4. Mood: Very motivated to get better and resume independent living. LCSW to follow for evaluation and support.  5. Neuropsych: This patient is capable of making decisions on her own behalf. 6. Skin/Wound Care: Routine pressure relief measures.   7. Fluids/Electrolytes/Nutrition: Labs being monitored ---hyponateremia/hyperkalemia being managed by HD, appreciate recs.  8. ESRD: On renal diet with 1200 FR. Hemodialysis after therapy sessions on MWF to help with tolerance of therapy.  9. DMT2: Monitor BS ac/hs.  Will remove diabetic retrictions to help with food choices/intake. Use SSI for elevated BS.   Home glipizide 5 mg on 4/8, will decrease to 2.5 on 4/13   Will continue to monitor.  Hyperglycemia in last 24 hours, likely related to SIRS 10 HTN:   Labile at present, will not start any medication to avoid hypotension during dialysis 11. Anemia of chronic disease: Continue Aransep weekly.  12. Metabolic bone disease: On renvela.  13. Hypoxia: Improving. Encourage IS.  14. Dysphagia  Patient was scheduled to get workup prior to admission  Per SLP, no significant issues 15. SIRS  Leukocytosis (trending down), isolated  fevers  Started on empiric Vanc/Zosyn  UA with few bacteria and small LE, U culture pending  Blood cultures pending  Chest x-ray from 4/12 reviewed, relatively unremarkable 16. Urinary retention   patient had urinary retention this weekend, but this appears to have resolved and she is in anuric at present.   LOS (Days) 8 A FACE TO FACE EVALUATION WAS PERFORMED  Craig Wisnewski Lorie Phenix 12/12/2015 8:24 AM

## 2015-12-12 NOTE — Progress Notes (Signed)
Occupational Therapy Note  Patient Details  Name: Carolyn Valenzuela MRN: [id number] Date of Birth: 1951/07/17  Today's Date: 12/12/2015 OT Group Time: 1300-1400 OT Group Time Calculation (min): 60 min  No c/o pain in session  Group therapy with one other pt.  Initially focus on toilet transfer from w/c with grab bar with min A, toileting with max A in a modified stance. Pt then was able to join other group mate with functional tasks to address core and UB strengthening, ability to maneuver w/c through obstacles, transfer training and scooting while achieving bottom clearance. Pt performed obstacles course of cones with supervision with extra time. UB exercise with weighted ball and orange theraband to address bilateral UE strengthening, w/c pushups, etc. Transfer training to and from mat encouraging other pt and problem solving proper w/c setup.  Transfers were with min A. Scooting down mat with VC to increase bottom clearance. Pt appreciated group setting to work on similar activities to improve their functional mobility.   [name] [facility] 12/12/2015, 2:13 PM

## 2015-12-13 ENCOUNTER — Inpatient Hospital Stay (HOSPITAL_COMMUNITY): Payer: BC Managed Care – PPO | Admitting: Physical Therapy

## 2015-12-13 ENCOUNTER — Inpatient Hospital Stay (HOSPITAL_COMMUNITY): Payer: BC Managed Care – PPO

## 2015-12-13 ENCOUNTER — Encounter (HOSPITAL_COMMUNITY): Payer: BC Managed Care – PPO | Admitting: Occupational Therapy

## 2015-12-13 LAB — CBC
HCT: 27.1 % — ABNORMAL LOW (ref 36.0–46.0)
Hemoglobin: 8.7 g/dL — ABNORMAL LOW (ref 12.0–15.0)
MCH: 29.5 pg (ref 26.0–34.0)
MCHC: 32.1 g/dL (ref 30.0–36.0)
MCV: 91.9 fL (ref 78.0–100.0)
Platelets: 323 10*3/uL (ref 150–400)
RBC: 2.95 MIL/uL — ABNORMAL LOW (ref 3.87–5.11)
RDW: 14.8 % (ref 11.5–15.5)
WBC: 17.4 10*3/uL — ABNORMAL HIGH (ref 4.0–10.5)

## 2015-12-13 LAB — RENAL FUNCTION PANEL
Albumin: 2.3 g/dL — ABNORMAL LOW (ref 3.5–5.0)
Anion gap: 14 (ref 5–15)
BUN: 52 mg/dL — ABNORMAL HIGH (ref 6–20)
CO2: 26 mmol/L (ref 22–32)
Calcium: 8.6 mg/dL — ABNORMAL LOW (ref 8.9–10.3)
Chloride: 90 mmol/L — ABNORMAL LOW (ref 101–111)
Creatinine, Ser: 6.26 mg/dL — ABNORMAL HIGH (ref 0.44–1.00)
GFR calc Af Amer: 7 mL/min — ABNORMAL LOW (ref >60)
GFR calc non Af Amer: 6 mL/min — ABNORMAL LOW (ref >60)
Glucose, Bld: 191 mg/dL — ABNORMAL HIGH (ref 65–99)
Phosphorus: 2.2 mg/dL — ABNORMAL LOW (ref 2.5–4.6)
Potassium: 3.8 mmol/L (ref 3.5–5.1)
Sodium: 130 mmol/L — ABNORMAL LOW (ref 135–145)

## 2015-12-13 LAB — GLUCOSE, CAPILLARY
Glucose-Capillary: 113 mg/dL — ABNORMAL HIGH (ref 65–99)
Glucose-Capillary: 235 mg/dL — ABNORMAL HIGH (ref 65–99)
Glucose-Capillary: 126 mg/dL — ABNORMAL HIGH (ref 65–99)

## 2015-12-13 LAB — URINE CULTURE: Culture: 20000 — AB

## 2015-12-13 LAB — CBC WITH DIFFERENTIAL/PLATELET
Basophils Absolute: 0.2 10*3/uL — ABNORMAL HIGH (ref 0.0–0.1)
Basophils Relative: 1 %
Eosinophils Absolute: 0.2 10*3/uL (ref 0.0–0.7)
Eosinophils Relative: 1 %
HCT: 28 % — ABNORMAL LOW (ref 36.0–46.0)
Hemoglobin: 9.2 g/dL — ABNORMAL LOW (ref 12.0–15.0)
Lymphocytes Relative: 19 %
Lymphs Abs: 2.9 10*3/uL (ref 0.7–4.0)
MCH: 30.6 pg (ref 26.0–34.0)
MCHC: 32.9 g/dL (ref 30.0–36.0)
MCV: 93 fL (ref 78.0–100.0)
Monocytes Absolute: 0.9 10*3/uL (ref 0.1–1.0)
Monocytes Relative: 6 %
Neutro Abs: 10.8 10*3/uL — ABNORMAL HIGH (ref 1.7–7.7)
Neutrophils Relative %: 73 %
Platelets: 309 10*3/uL (ref 150–400)
RBC: 3.01 MIL/uL — ABNORMAL LOW (ref 3.87–5.11)
RDW: 15.1 % (ref 11.5–15.5)
WBC: 15 10*3/uL — ABNORMAL HIGH (ref 4.0–10.5)

## 2015-12-13 LAB — BASIC METABOLIC PANEL
Anion gap: 14 (ref 5–15)
BUN: 47 mg/dL — ABNORMAL HIGH (ref 6–20)
CO2: 28 mmol/L (ref 22–32)
Calcium: 8.5 mg/dL — ABNORMAL LOW (ref 8.9–10.3)
Chloride: 91 mmol/L — ABNORMAL LOW (ref 101–111)
Creatinine, Ser: 5.97 mg/dL — ABNORMAL HIGH (ref 0.44–1.00)
GFR calc Af Amer: 8 mL/min — ABNORMAL LOW (ref >60)
GFR calc non Af Amer: 7 mL/min — ABNORMAL LOW (ref >60)
Glucose, Bld: 126 mg/dL — ABNORMAL HIGH (ref 65–99)
Potassium: 3.5 mmol/L (ref 3.5–5.1)
Sodium: 133 mmol/L — ABNORMAL LOW (ref 135–145)

## 2015-12-13 LAB — PROCALCITONIN: Procalcitonin: 116.15 ng/mL

## 2015-12-13 MED ORDER — LIDOCAINE HCL (PF) 1 % IJ SOLN: 5.0000 mL | INTRAMUSCULAR | Status: DC | PRN

## 2015-12-13 MED ORDER — LIDOCAINE-PRILOCAINE 2.5-2.5 % EX CREA: 1.0000 "application " | TOPICAL_CREAM | CUTANEOUS | Status: DC | PRN

## 2015-12-13 MED ORDER — HEPARIN SODIUM (PORCINE) 1000 UNIT/ML DIALYSIS: 1000.0000 [IU] | INTRAMUSCULAR | Status: DC | PRN

## 2015-12-13 MED ORDER — VANCOMYCIN HCL IN DEXTROSE 500-5 MG/100ML-% IV SOLN
INTRAVENOUS | Status: AC
  Filled 2015-12-13: qty 100

## 2015-12-13 MED ORDER — ALTEPLASE 2 MG IJ SOLR: 2.0000 mg | Freq: Once | INTRAMUSCULAR | Status: DC | PRN

## 2015-12-13 MED ORDER — HYDROCODONE-ACETAMINOPHEN 5-325 MG PO TABS
ORAL_TABLET | ORAL | Status: DC
Start: 2015-12-13 — End: 2015-12-13
  Filled 2015-12-13: qty 2

## 2015-12-13 MED ORDER — PENTAFLUOROPROP-TETRAFLUOROETH EX AERO: 1.0000 "application " | INHALATION_SPRAY | CUTANEOUS | Status: DC | PRN

## 2015-12-13 MED ORDER — SODIUM CHLORIDE 0.9 % IV SOLN: 100.0000 mL | INTRAVENOUS | Status: DC | PRN

## 2015-12-13 MED ORDER — HEPARIN SODIUM (PORCINE) 1000 UNIT/ML DIALYSIS
100.0000 [IU]/kg | INTRAMUSCULAR | Status: DC | PRN
  Filled 2015-12-13: qty 6

## 2015-12-13 NOTE — Progress Notes (Signed)
This RN attempted to call report from bedside RN.  Bedside RN at lunch, this RN to phone back in 15 minutes.

## 2015-12-13 NOTE — Progress Notes (Signed)
Patient stating she still may be backed up and could have another large bowel movement but not able to right now.  Refused suppository last night, stated she would ask MD about enema in AM.  Patient not distended. Will continue to monitor.

## 2015-12-13 NOTE — Progress Notes (Signed)
Orthopedic Tech Progress Note Patient Details:  Carolyn Valenzuela 07-25-1951 [id number]  Patient ID: [name], female   DOB: Jul 17, 1951, 65 y.o.   MRN: [id number]   [name] 12/13/2015, 10:14 AMCalled Advanced for stump shrinker.

## 2015-12-13 NOTE — Progress Notes (Signed)
Physical Therapy Weekly Progress Note  Patient Details  Name: Carolyn Valenzuela MRN: [id number] Date of Birth: 05-14-1951  Beginning of progress report period: December 05, 2015 End of progress report period: December 13, 2015  Today's Date: 12/13/2015 PT Individual Time: 0800-0900 PT Individual Time Calculation (min): 60 min   Patient has met 2 of 4 short term goals.    Patient continues to demonstrate the following deficits: Strength, balance, endurance, safety and therefore will continue to benefit from skilled PT intervention to enhance overall performance with activity tolerance, balance, postural control, ability to compensate for deficits and functional use of  right lower extremity.  Patient progressing toward long term goals..  Continue plan of care.  PT Short Term Goals Week 1:  PT Short Term Goal 1 (Week 1): bed mobility without cues or Assistance.  PT Short Term Goal 1 - Progress (Week 1): Met PT Short Term Goal 2 (Week 1): Bed chair transfer with Min A with sqaut pivot.  PT Short Term Goal 2 - Progress (Week 1): Progressing toward goal PT Short Term Goal 3 (Week 1): Sit to stand with RW with min A.  PT Short Term Goal 3 - Progress (Week 1): Progressing toward goal PT Short Term Goal 4 (Week 1): gait with RW for 38f with min A.  PT Short Term Goal 4 - Progress (Week 1): Met Week 2:  PT Short Term Goal 1 (Week 2): STG = LTG due to LOS  Skilled Therapeutic Interventions/Progress Updates:    Patient received Supine in bed and agreeable to PT. PT observed patient performed bed mobility slight use of bed rail, but no cues provided for safety. PT instructed patient in squat pivot transfer to chair with min A and visual cues for proper UE placement. MD assessed patient's wound in WC, PT applied compression bandage with cues for patient to perform in the near future.  WC mobility to PT gym with supervision A from PT for 150 ft. Gait training performed in Parallel bars for 20 ft with 2 turns,  min A from PT and moderate cueing for sequencing with turns. Patient performed Squat pivot transfer to bed in rehab gym with Min A and cues for UE positioning and increased weight bearing through LLE. Bed mobility performed on flat bed with Roll L and R, as well as supine<>sit transfer with increased time, but no cues from PT. [facility] technique and min A from PT, heavy cues for set up and positioning. Gait training with RW for 117fwith min A from PT and 1525fith RW with min A from PT for improved safety, as well as verbal and visual instruction for AD management. [facility] with call bell within reach.    Therapy Documentation Precautions:  Precautions Precautions: Fall Precaution Comments: wound vac Required Braces or Orthoses: Other Brace/Splint Other Brace/Splint: has prosthetic for RLE Restrictions Weight Bearing Restrictions: Yes LLE Weight Bearing: Non weight bearing Other Position/Activity Restrictions: LLE NWB   Pain: Pain Assessment Pain Score: Asleep   See Function Navigator for Current Functional Status.  Therapy/Group: Individual Therapy  AusLorie Phenix14/2017, 12:42 PM

## 2015-12-13 NOTE — Progress Notes (Signed)
Occupational Therapy Weekly Progress Note  Patient Details  Name: Carolyn Valenzuela MRN: [id number] Date of Birth: 10/22/1950  Beginning of progress report period: December 05, 2015 End of progress report period: December 13, 2015  Today's Date: 12/13/2015 OT Individual Time: 0930-1030 OT Individual Time Calculation (min): 60 min   Patient has met 2 of 4 short term goals.  Pt is progressing with all goals however continues to require assist with problem-solving and discharge planning d/t evolving dynamics with home environment and assist planned from her sister.   OT recommend home visit to provide needed details on stability of ramp, entrance, and access to bathroom.  Patient continues to demonstrate the following deficits: Impaired supported standing balance, endurance, problem-solving, BUE strength and therefore will continue to benefit from skilled OT intervention to enhance overall performance with BADL and iADL.  Patient progressing toward long term goals..  Continue plan of care.  OT Short Term Goals Week 1:  OT Short Term Goal 1 (Week 1): Complete toileting with min assist OT Short Term Goal 1 - Progress (Week 1): Met OT Short Term Goal 2 (Week 1): Complete toilet transfer with setup and supervision OT Short Term Goal 2 - Progress (Week 1): Partly met OT Short Term Goal 3 (Week 1): Demo ability to retreieve and prepare simple meal at w/c level OT Short Term Goal 3 - Progress (Week 1): Progressing toward goal OT Short Term Goal 4 (Week 1): Complete lower body bathing with min assist OT Short Term Goal 4 - Progress (Week 1): Met OT Short Term Goal 5 (Week 1): Doff and don pants seated using lateral leans and AE, prn OT Short Term Goal 5 - Progress (Week 1): Partly met Week 2:  OT Short Term Goal 1 (Week 2): Pt will complete 3 of 3 toileting tasks with only supervision assist. OT Short Term Goal 2 (Week 2): Pt will demo ability to retrieve items from kitchen at w/c level independently in  prep for simple meal. OT Short Term Goal 3 (Week 2): Pt will don/doff prosthesis sitting at EOB independently OT Short Term Goal 4 (Week 2): Pt will verbalize 4 compensatory strategies to simplify performance of BADL/iADL s/p discharge (ie wear gowns vs pants, use convenience meals, delegate iADL, etc.) OT Short Term Goal 5 (Week 2): Pt will complete upper body HEP using written instructions with supervision for technique  Skilled Therapeutic Interventions/Progress Updates: ADL-retraining at sink side with focus on sit<>stand, toileting, dynamic supported standing balance, adapted dressing skills, problem-solving, discharge planning.   Pt received seated on toilet with RN tech nearby.   Pt completes toilet hygiene using lateral leans and allows OT assist for inspection of thoroughness with pt standing supported at RW.   Pt with good thoroughness however OT notes no dressings at buttocks wounds and alerts RN for need of dressing change.   Per RN, change to care of wounds recommended w/o dressing, using APC (barrier cream) instead.   Pt was unaware of change in treatment and was re-educated by OT.   Pt refuses shower level bathing and reports new plan to continue bathing at sink at home, using her bench at home placed by vanity, close to entrance of bathroom.   Pt requires assist to pull up her pants at toilet while standing and then returns to w/c level to bathe at sink with setup.   Pt progresses well at sink and was advised to practice supported standing to wash buttocks and peri-aea with mod instructional cues for  technique to alternate right and left hand use while leaning on opposite elbow at sink.   Pt reported recalling similar training following R-BKA and performs task with steadying assist for safety.   Pt unable to don bra while dressing and was educated on alternate method (donning bra over head after fastening snaps).   Pt succeeds with min vc to readjust lower border of bra that rolled over while  she donned bra.   During discussion of discharge status, pt reports new plan for sister to reside with her immediately after discharge for unknown length of time.   Plan to continue discussion with family members as feasible as pt requires supervision for problem-solving, setup and assist with community mobility and access in/out of home.     Therapy Documentation Precautions:  Precautions Precautions: Fall Precaution Comments: wound vac Required Braces or Orthoses: Other Brace/Splint Other Brace/Splint: has prosthetic for RLE Restrictions Weight Bearing Restrictions: Yes LLE Weight Bearing: Non weight bearing Other Position/Activity Restrictions: LLE NWB  Pain: Pain Assessment Pain Score: No/denies pain  ADL: ADL ADL Comments: see Functional Assessment Tool  See Function Navigator for Current Functional Status.   Therapy/Group: Individual Therapy  Annabella Elford 12/14/2015, 6:27 AM

## 2015-12-13 NOTE — Progress Notes (Signed)
Patient arrived to unit per bed.  Reviewed treatment plan and this RN agrees.  Report received from bedside RN, Maudry Mayhew.  Consent verified.  Patient A & o X 4. Lung sounds clear to ausculation in all fields. No edema. Cardiac: NSR.  Prepped LUAVF with alcohol and cannulated with two 16 gauge needles.  Pulsation of blood noted.  Flushed access well with saline per protocol.  Connected and secured lines and initiated tx at 1455.  UF goal of 2000 mL and net fluid removal of 1500 mL.  Will continue to monitor.

## 2015-12-13 NOTE — Progress Notes (Addendum)
[place] PHYSICAL MEDICINE & REHABILITATION     PROGRESS NOTE  Subjective/Complaints:  Patient sitting up in her chair this morning working with PT. She feels much better today.  ROS:  + Occasional pain in left stump. Denies CP, SOB, N/V/D.  Objective: Vital Signs: Blood pressure 154/56, pulse 81, temperature 99.6 F (37.6 C), temperature source Oral, resp. rate 18, weight 51.7 kg (113 lb 15.7 oz), SpO2 100 %. Dg Chest 2 View  12/11/2015  CLINICAL DATA:  Fever for 2 days. EXAM: CHEST  2 VIEW COMPARISON:  12/03/2015 FINDINGS: Mild linear scarring noted at the left lung base, stable. Lungs are otherwise clear. No pleural effusion or pneumothorax. Heart is mildly enlarged. No mediastinal or hilar masses or evidence of adenopathy. Bony thorax is intact. No change from prior study. IMPRESSION: No active cardiopulmonary disease. Electronically Signed   By: Lajean Manes M.D.   On: 12/11/2015 13:45    Recent Labs  12/12/15 0429 12/13/15 0531  WBC 17.8* 15.0*  HGB 9.0* 9.2*  HCT 28.6* 28.0*  PLT 294 309    Recent Labs  12/11/15 2359 12/13/15 0531  NA 131* 133*  K 3.2* 3.5  CL 92* 91*  GLUCOSE 403* 126*  BUN 22* 47*  CREATININE 3.58* 5.97*  CALCIUM 8.0* 8.5*   CBG (last 3)   Recent Labs  12/12/15 1644 12/12/15 2217 12/13/15 0708  GLUCAP 95 275* 113*    Wt Readings from Last 3 Encounters:  12/13/15 51.7 kg (113 lb 15.7 oz)  12/04/15 51 kg (112 lb 7 oz)  11/25/15 54.885 kg (121 lb)    Physical Exam:  BP 154/56 mmHg  Pulse 81  Temp(Src) 99.6 F (37.6 C) (Oral)  Resp 18  Wt 51.7 kg (113 lb 15.7 oz)  SpO2 100% Constitutional: She appears well-developed. NAD.  HENT:  Normocephalic and atraumatic.   Eyes: Conjunctivae and EOM are normal. + Murmur Cardiovascular: Normal rate and regular rhythm. Respiratory: Effort normal and breath sounds normal. No respiratory distress. She has no wheezes.  GI: Soft. Bowel sounds are normal. She exhibits no distension. There is  no tenderness.  Musculoskeletal: She exhibits mild edema, no tenderness.  Bilateral BKA  Neurological: She is alert and oriented.  Motor: Bilateral upper extremities: 4+/5 proximal to distal Right lower extremity: BKA, 5/5 hip flexion  Left lower extremity: Hip flexion 5/5  Skin: Skin is warm and dry.  left BKA with staples C/D/I.  R-BKA healed well. Psychiatric: She has a normal mood and affect. Her behavior is normal. Thought content normal.   Assessment/Plan: 1. Functional deficits secondary to to left BKA, with previous right BKA which require 3+ hours per day of interdisciplinary therapy in a comprehensive inpatient rehab setting. Physiatrist is providing close team supervision and 24 hour management of active medical problems listed below. Physiatrist and rehab team continue to assess barriers to discharge/monitor patient progress toward functional and medical goals.  Function:  Bathing Bathing position   Position: Sitting EOB  Bathing parts Body parts bathed by patient: Right arm, Left arm, Chest, Abdomen, Front perineal area, Right upper leg, Left upper leg Body parts bathed by helper: Back, Buttocks  Bathing assist Assist Level: Touching or steadying assistance(Pt > 75%)   Set up : To obtain items  Upper Body Dressing/Undressing Upper body dressing   What is the patient wearing?: Bra, Pull over shirt/dress Bra - Perfomed by patient: Thread/unthread right bra strap, Thread/unthread left bra strap, Hook/unhook bra (pull down sports bra) Bra - Perfomed by  helper: Thread/unthread left bra strap Pull over shirt/dress - Perfomed by patient: Thread/unthread right sleeve, Thread/unthread left sleeve, Put head through opening, Pull shirt over trunk Pull over shirt/dress - Perfomed by helper: Pull shirt over trunk        Upper body assist Assist Level: Set up   Set up : To obtain clothing/put away  Lower Body Dressing/Undressing Lower body dressing Lower body  dressing/undressing activity did not occur: Refused What is the patient wearing?: Underwear, Pants Underwear - Performed by patient: Thread/unthread right underwear leg, Thread/unthread left underwear leg, Pull underwear up/down Underwear - Performed by helper: Thread/unthread left underwear leg Pants- Performed by patient: Thread/unthread left pants leg, Thread/unthread right pants leg, Pull pants up/down Pants- Performed by helper: Thread/unthread right pants leg, Pull pants up/down                      Lower body assist Assist for lower body dressing: Touching or steadying assistance (Pt > 75%)      Toileting Toileting Toileting activity did not occur: N/A (anuric) Toileting steps completed by patient: Adjust clothing prior to toileting, Performs perineal hygiene Toileting steps completed by helper: Adjust clothing after toileting Toileting Assistive Devices: Grab bar or rail  Toileting assist Assist level:  (helper did all tasks, total assist)   Transfers Chair/bed transfer   Chair/bed transfer method: Squat pivot, Stand pivot (max assist for stand/pivot, mod assist for squat pivot) Chair/bed transfer assist level: Moderate assist (Pt 50 - 74%/lift or lower) Chair/bed transfer assistive device: Armrests     Locomotion Ambulation Ambulation activity did not occur: Safety/medical concerns   Max distance: 1 ft Assist level: Touching or steadying assistance (Pt > 75%)   Wheelchair   Type: Manual Max wheelchair distance: >200 Assist Level: Supervision or verbal cues  Cognition Comprehension Comprehension assist level: Follows basic conversation/direction with no assist  Expression Expression assist level: Expresses basic needs/ideas: With no assist  Social Interaction Social Interaction assist level: Interacts appropriately with others with medication or extra time (anti-anxiety, antidepressant).  Problem Solving Problem solving assist level: Solves basic problems with  no assist  Memory Memory assist level: Recognizes or recalls 90% of the time/requires cueing < 10% of the time    Medical Problem List and Plan: 1. Abnormality of gait, safety, deficits with transfers secondary to left BKA, with previous right BKA.  Continue CIR  Stump shrinker ordered for left BKA 2. DVT Prophylaxis/Anticoagulation: Pharmaceutical: Lovenox 3. Pain Management:Hydrocodone prn for pain, low dose robaxin to help with muscle spasms.  4. Mood: Very motivated to get better and resume independent living. LCSW to follow for evaluation and support.  5. Neuropsych: This patient is capable of making decisions on her own behalf. 6. Skin/Wound Care: Routine pressure relief measures.   7. Fluids/Electrolytes/Nutrition: Labs being monitored ---hyponateremia/hyperkalemia being managed by HD, appreciate recs.  8. ESRD: On renal diet with 1200 FR. Hemodialysis after therapy sessions on MWF to help with tolerance of therapy.  9. DMT2: Monitor BS ac/hs.  Will remove diabetic retrictions to help with food choices/intake. Use SSI for elevated BS.   Home glipizide 5 mg on 4/8, will decrease to 2.5 on 4/13   Will continue to monitor.  Hyperglycemia in last 48 hours, likely related to SIRS, improving 10 HTN:   Labile at present, will not start any medication to avoid hypotension during dialysis 11. Anemia of chronic disease: Continue Aransep weekly.  12. Metabolic bone disease: On renvela.  13. Hypoxia: Improving. Encourage  IS.  14. Dysphagia  Patient was scheduled to get workup prior to admission  Per SLP, no significant issues 15. SIRS  Leukocytosis (trending down), isolated fevers-improving  Started on empiric Vanc/Zosyn  UA with few bacteria and small LE, U culture pending (NGTD)  Blood cultures pending (NGTD)  Chest x-ray from 4/12 reviewed, relatively unremarkable 16. Urinary retention   patient had urinary retention this weekend, but this appears to have resolved and  she is in anuric at present.   LOS (Days) 9 A FACE TO FACE EVALUATION WAS PERFORMED  Keandria Berrocal Lorie Phenix 12/13/2015 10:02 AM

## 2015-12-13 NOTE — Progress Notes (Signed)
Dialysis treatment completed.  2000 mL ultrafiltrated and net fluid removal 1500 mL.    Patient status unchanged. Lung sounds clear to ausculation in all fields. Generalized edema. Cardiac: NSR.  Disconnected lines and removed needles.  Pressure held for 10 minutes and band aid/gauze dressing applied.  Report given to bedside RN, Maudry Mayhew.

## 2015-12-13 NOTE — Progress Notes (Signed)
Occupational Therapy Session Note  Patient Details  Name: Carolyn Valenzuela MRN: [id number] Date of Birth: 17-Sep-1950  Today's Date: 12/13/2015 OT Group Time: 1300-1410 OT Group Time Calculation (min): 70 min    Skilled Therapeutic Interventions/Progress Updates:      Therapeutic activity group: focus on community w/c mobility including navigating on uneven surfaces outside, activity tolerance/ endurance, problem solving clothing with toileting, home setup and safety recommendations.  Pts returned to unit and practiced toileting with [name] over their toilet in their own bathroom. Pt able to perform toileting with mod A with steady A for transfer. Pt return to bed to rest after group.   Therapy Documentation Precautions:  Precautions Precautions: Fall Precaution Comments: wound vac Required Braces or Orthoses: Other Brace/Splint Other Brace/Splint: has prosthetic for RLE Restrictions Weight Bearing Restrictions: Yes LLE Weight Bearing: Non weight bearing Other Position/Activity Restrictions: LLE NWB Pain: Pain Assessment Pain Assessment: 0-10 Pain Score: 8  Pain Type: Acute pain Pain Location: Leg Pain Orientation: Left Pain Descriptors / Indicators: Aching Pain Frequency: Intermittent Pain Onset: Gradual Patients Stated Pain Goal: 4 Pain Intervention(s): Medication (See eMAR) ADL: ADL ADL Comments: see Functional Assessment Tool Exercises:   Other Treatments:    See Function Navigator for Current Functional Status.   Therapy/Group: Group Therapy  [name] [facility] 12/13/2015, 3:53 PM

## 2015-12-13 NOTE — Progress Notes (Signed)
Subjective: Interval History: has no complaint .  Objective: Vital signs in last 24 hours: Temp:  [99.2 F (37.3 C)-99.6 F (37.6 C)] 99.6 F (37.6 C) (04/14 0546) Pulse Rate:  [81-85] 81 (04/14 0546) Resp:  [17-18] 18 (04/14 0546) BP: (130-154)/(51-56) 154/56 mmHg (04/14 0546) SpO2:  [99 %-100 %] 100 % (04/14 0546) Weight:  [51.7 kg (113 lb 15.7 oz)] 51.7 kg (113 lb 15.7 oz) (04/14 0546) Weight change: -1.2 kg (-2 lb 10.3 oz)  Intake/Output from previous day: 04/13 0701 - 04/14 0700 In: 1250 [P.O.:1200; IV Piggyback:50] Out: -  Intake/Output this shift: Total I/O In: 240 [P.O.:240] Out: -   General appearance: alert, cooperative and no distress Resp: clear to auscultation bilaterally Cardio: S1, S2 normal and systolic murmur: systolic ejection 2/6, decrescendo at 2nd left intercostal space GI: soft, non-tender; bowel sounds normal; no masses,  no organomegaly Extremities: AVF LUA, New BKA on L , old R  Lab Results:  Recent Labs  12/12/15 0429 12/13/15 0531  WBC 17.8* 15.0*  HGB 9.0* 9.2*  HCT 28.6* 28.0*  PLT 294 309   BMET:  Recent Labs  12/11/15 2359 12/13/15 0531  NA 131* 133*  K 3.2* 3.5  CL 92* 91*  CO2 26 28  GLUCOSE 403* 126*  BUN 22* 47*  CREATININE 3.58* 5.97*  CALCIUM 8.0* 8.5*   No results for input(s): PTH in the last 72 hours. Iron Studies: No results for input(s): IRON, TIBC, TRANSFERRIN, FERRITIN in the last 72 hours.  Studies/Results: Dg Chest 2 View  12/11/2015  CLINICAL DATA:  Fever for 2 days. EXAM: CHEST  2 VIEW COMPARISON:  12/03/2015 FINDINGS: Mild linear scarring noted at the left lung base, stable. Lungs are otherwise clear. No pleural effusion or pneumothorax. Heart is mildly enlarged. No mediastinal or hilar masses or evidence of adenopathy. Bony thorax is intact. No change from prior study. IMPRESSION: No active cardiopulmonary disease. Electronically Signed   By: Lajean Manes M.D.   On: 12/11/2015 13:45    I have reviewed  the patient's current medications.  Assessment/Plan: 1 ESRD  For HD 2 HPTH low phos, recheck 3 Anemia cont esa 4 DM controlled 5 PVD per rehab P HD, esa, rehab,  Check Phos    LOS: 9 days   Carolyn Valenzuela L 12/13/2015,9:14 AM

## 2015-12-13 NOTE — Procedures (Signed)
I was present at this session.  I have reviewed the session itself and made appropriate changes.  HD via LUA AVF.  Access press ok.  bp ^ to start.  Carolyn Valenzuela L 4/14/20173:13 PM

## 2015-12-14 ENCOUNTER — Inpatient Hospital Stay (HOSPITAL_COMMUNITY): Payer: BC Managed Care – PPO | Admitting: Occupational Therapy

## 2015-12-14 LAB — GLUCOSE, CAPILLARY
Glucose-Capillary: 117 mg/dL — ABNORMAL HIGH (ref 65–99)
Glucose-Capillary: 264 mg/dL — ABNORMAL HIGH (ref 65–99)
Glucose-Capillary: 172 mg/dL — ABNORMAL HIGH (ref 65–99)
Glucose-Capillary: 218 mg/dL — ABNORMAL HIGH (ref 65–99)

## 2015-12-14 MED ORDER — DARBEPOETIN ALFA 200 MCG/0.4ML IJ SOSY
200.0000 ug | PREFILLED_SYRINGE | INTRAMUSCULAR | Status: DC
  Administered 2015-12-18: 200 ug via INTRAVENOUS
  Filled 2015-12-14: qty 0.4

## 2015-12-14 MED ORDER — LOPERAMIDE HCL 2 MG PO CAPS
2.0000 mg | ORAL_CAPSULE | Freq: Four times a day (QID) | ORAL | Status: DC | PRN
  Administered 2015-12-14 – 2015-12-15 (×4): 2 mg via ORAL
  Filled 2015-12-14 (×4): qty 1

## 2015-12-14 MED ORDER — SACCHAROMYCES BOULARDII 250 MG PO CAPS
250.0000 mg | ORAL_CAPSULE | Freq: Two times a day (BID) | ORAL | Status: DC
  Administered 2015-12-14 – 2015-12-19 (×12): 250 mg via ORAL
  Filled 2015-12-14 (×12): qty 1

## 2015-12-14 NOTE — Progress Notes (Signed)
Subjective: Interval History: has complaints sore in shoulders and neck with workouts.  Objective: Vital signs in last 24 hours: Temp:  [99 F (37.2 C)-101.4 F (38.6 C)] 99.1 F (37.3 C) (04/15 0501) Pulse Rate:  [81-92] 81 (04/15 0501) Resp:  [16-22] 20 (04/15 0501) BP: (142-179)/(48-64) 164/53 mmHg (04/15 0501) SpO2:  [97 %-100 %] 97 % (04/15 0501) Weight:  [50.2 kg (110 lb 10.7 oz)-52.9 kg (116 lb 10 oz)] 50.2 kg (110 lb 10.7 oz) (04/15 0501) Weight change: 1.2 kg (2 lb 10.3 oz)  Intake/Output from previous day: 04/14 0701 - 04/15 0700 In: 480 [P.O.:480] Out: 1500  Intake/Output this shift: Total I/O In: 240 [P.O.:240] Out: -   General appearance: alert, cooperative and no distress Resp: clear to auscultation bilaterally Cardio: S1, S2 normal and systolic murmur: systolic ejection 2/6, decrescendo at 2nd left intercostal space GI: soft, non-tender; bowel sounds normal; no masses,  no organomegaly Extremities: bilat BKAs, LUA avf  Lab Results:  Recent Labs  12/13/15 0531 12/13/15 1500  WBC 15.0* 17.4*  HGB 9.2* 8.7*  HCT 28.0* 27.1*  PLT 309 323   BMET:  Recent Labs  12/13/15 0531 12/13/15 1500  NA 133* 130*  K 3.5 3.8  CL 91* 90*  CO2 28 26  GLUCOSE 126* 191*  BUN 47* 52*  CREATININE 5.97* 6.26*  CALCIUM 8.5* 8.6*   No results for input(s): PTH in the last 72 hours. Iron Studies: No results for input(s): IRON, TIBC, TRANSFERRIN, FERRITIN in the last 72 hours.  Studies/Results: No results found.  I have reviewed the patient's current medications.  Assessment/Plan: 1 ESRD HD MWF slowly lower vol and bp 2 Anemia will ^esa, check Fe on HD on Mon 3 PVD post BKAs rehab 4 HPTH vit D 5 DM controlled 6 ^ WBC  ?? Cause ,follow P HD MWF, check Fe on Mon, cont esa, vit D,     LOS: 10 days   Carolyn Valenzuela L 12/14/2015,9:07 AM

## 2015-12-14 NOTE — Progress Notes (Signed)
[place] PHYSICAL MEDICINE & REHABILITATION     PROGRESS NOTE  Subjective/Complaints:  Sitting up in bed. Denies fever/chills. Left stump feeling better. Slept well.  ROS:  + Occasional pain in left stump. Denies CP, SOB, N/V/D.  Objective: Vital Signs: Blood pressure 164/53, pulse 81, temperature 99.1 F (37.3 C), temperature source Oral, resp. rate 20, weight 50.2 kg (110 lb 10.7 oz), SpO2 97 %. No results found.  Recent Labs  12/13/15 0531 12/13/15 1500  WBC 15.0* 17.4*  HGB 9.2* 8.7*  HCT 28.0* 27.1*  PLT 309 323    Recent Labs  12/13/15 0531 12/13/15 1500  NA 133* 130*  K 3.5 3.8  CL 91* 90*  GLUCOSE 126* 191*  BUN 47* 52*  CREATININE 5.97* 6.26*  CALCIUM 8.5* 8.6*   CBG (last 3)   Recent Labs  12/13/15 1130 12/13/15 2055 12/14/15 0642  GLUCAP 235* 126* 117*    Wt Readings from Last 3 Encounters:  12/14/15 50.2 kg (110 lb 10.7 oz)  12/04/15 51 kg (112 lb 7 oz)  11/25/15 54.885 kg (121 lb)    Physical Exam:  BP 164/53 mmHg  Pulse 81  Temp(Src) 99.1 F (37.3 C) (Oral)  Resp 20  Wt 50.2 kg (110 lb 10.7 oz)  SpO2 97% Constitutional: She appears well-developed. NAD.  HENT:  Normocephalic and atraumatic.   Eyes: Conjunctivae and EOM are normal. + Murmur Cardiovascular: Normal rate and regular rhythm. Respiratory: Effort normal and breath sounds normal. No respiratory distress. She has no wheezes.  GI: Soft. Bowel sounds are normal. She exhibits no distension. There is no tenderness.  Musculoskeletal: She exhibits mild edema, no tenderness.  Bilateral BKA  Neurological: She is alert and oriented.  Motor: Bilateral upper extremities: 4+/5 proximal to distal Right lower extremity: BKA, 5/5 hip flexion  Left lower extremity: Hip flexion 5/5  Skin: Skin is warm and dry.  left BKA with staples C/D/I.  R-BKA healed well. Psychiatric: She has a normal mood and affect. Her behavior is normal. Thought content normal.   Assessment/Plan: 1.  Functional deficits secondary to to left BKA, with previous right BKA which require 3+ hours per day of interdisciplinary therapy in a comprehensive inpatient rehab setting. Physiatrist is providing close team supervision and 24 hour management of active medical problems listed below. Physiatrist and rehab team continue to assess barriers to discharge/monitor patient progress toward functional and medical goals.  Function:  Bathing Bathing position   Position: Wheelchair/chair at sink  Bathing parts Body parts bathed by patient: Right arm, Left arm, Chest, Abdomen, Front perineal area, Buttocks, Right upper leg, Left upper leg Body parts bathed by helper: Back  Bathing assist Assist Level: Touching or steadying assistance(Pt > 75%)   Set up : To obtain items  Upper Body Dressing/Undressing Upper body dressing   What is the patient wearing?: Bra, Pull over shirt/dress Bra - Perfomed by patient: Thread/unthread right bra strap, Thread/unthread left bra strap, Hook/unhook bra (pull down sports bra) Bra - Perfomed by helper: Thread/unthread left bra strap Pull over shirt/dress - Perfomed by patient: Thread/unthread right sleeve, Thread/unthread left sleeve, Put head through opening, Pull shirt over trunk Pull over shirt/dress - Perfomed by helper: Pull shirt over trunk        Upper body assist Assist Level: Supervision or verbal cues, Set up   Set up : To obtain clothing/put away  Lower Body Dressing/Undressing Lower body dressing Lower body dressing/undressing activity did not occur: Refused What is the patient wearing?: Underwear,  Pants Underwear - Performed by patient: Pull underwear up/down Underwear - Performed by helper: Thread/unthread right underwear leg, Thread/unthread left underwear leg Pants- Performed by patient: Thread/unthread left pants leg, Thread/unthread right pants leg, Pull pants up/down Pants- Performed by helper: Thread/unthread right pants leg, Thread/unthread  left pants leg                      Lower body assist Assist for lower body dressing:  (Mod assist)      Toileting Toileting Toileting activity did not occur: N/A (anuric) Toileting steps completed by patient: Adjust clothing prior to toileting, Performs perineal hygiene Toileting steps completed by helper: Adjust clothing after toileting Toileting Assistive Devices: Grab bar or rail  Toileting assist Assist level:  (helper did all tasks, total assist)   Transfers Chair/bed transfer   Chair/bed transfer method: Squat pivot Chair/bed transfer assist level: Touching or steadying assistance (Pt > 75%) Chair/bed transfer assistive device: Armrests     Locomotion Ambulation Ambulation activity did not occur: Safety/medical concerns   Max distance: 63ft Assist level: Touching or steadying assistance (Pt > 75%)   Wheelchair   Type: Manual Max wheelchair distance: 150 Assist Level: Supervision or verbal cues  Cognition Comprehension Comprehension assist level: Follows basic conversation/direction with no assist  Expression Expression assist level: Expresses basic needs/ideas: With no assist  Social Interaction Social Interaction assist level: Interacts appropriately 75 - 89% of the time - Needs redirection for appropriate language or to initiate interaction.  Problem Solving Problem solving assist level: Solves basic problems with no assist  Memory Memory assist level: Recognizes or recalls 90% of the time/requires cueing < 10% of the time    Medical Problem List and Plan: 1. Abnormality of gait, safety, deficits with transfers secondary to left BKA, with previous right BKA.  Continue CIR  Stump shrinker ordered for left BKA 2. DVT Prophylaxis/Anticoagulation: Pharmaceutical: Lovenox 3. Pain Management:Hydrocodone prn for pain, low dose robaxin to help with muscle spasms.  4. Mood: Very motivated to get better and resume independent living. LCSW to follow for  evaluation and support.  5. Neuropsych: This patient is capable of making decisions on her own behalf. 6. Skin/Wound Care: Routine pressure relief measures.   7. Fluids/Electrolytes/Nutrition: Labs being monitored ---hyponateremia/hyperkalemia being managed by HD, appreciate recs.  8. ESRD: On renal diet with 1200 FR. Hemodialysis after therapy sessions on MWF to help with tolerance of therapy.  9. DMT2: Monitor BS ac/hs.  Will remove diabetic retrictions to help with food choices/intake. Use SSI for elevated BS.   Home glipizide 5 mg on 4/8, will decrease to 2.5 on 4/13   Will continue to monitor.  Hyperglycemia in last 48 hours, likely related to SIRS, resolving 10 HTN:   Some improvement 11. Anemia of chronic disease: Continue Aransep weekly.  12. Metabolic bone disease: On renvela.  13. Hypoxia: Improving. Encourage IS.  14. Dysphagia  Patient was scheduled to get workup prior to admission  Per SLP, no significant issues 15. SIRS  Leukocytosis (trending down), continued fevers/ low grade temps  Continue on empiric Vanc/Zosyn for now  UCX negative, stump looks ok  Blood cultures pending (NGTD)  Chest x-ray from 4/12  unremarkable 16. Urinary retention   patient had urinary retention this weekend, but this appears to have resolved and she is in anuric at present.   LOS (Days) 10 A FACE TO FACE EVALUATION WAS PERFORMED  SWARTZ,ZACHARY T 12/14/2015 9:15 AM

## 2015-12-14 NOTE — Progress Notes (Signed)
Occupational Therapy Session Note  Patient Details  Name: KAYTLINN OPSAHL MRN: [id number] Date of Birth: 1950-12-05  Today's Date: 12/14/2015 OT Individual Time: [id number] OT Individual Time Calculation (min): 32 min    Short Term Goals: Week 2:  OT Short Term Goal 1 (Week 2): Pt will complete 3 of 3 toileting tasks with only supervision assist. OT Short Term Goal 2 (Week 2): Pt will demo ability to retrieve items from kitchen at w/c level independently in prep for simple meal. OT Short Term Goal 3 (Week 2): Pt will don/doff prosthesis sitting at EOB independently OT Short Term Goal 4 (Week 2): Pt will verbalize 4 compensatory strategies to simplify performance of BADL/iADL s/p discharge (ie wear gowns vs pants, use convenience meals, delegate iADL, etc.) OT Short Term Goal 5 (Week 2): Pt will complete upper body HEP using written instructions with supervision for technique  Skilled Therapeutic Interventions/Progress Updates:    Treatment session with focus on functional transfers and lateral leans for toileting hygiene.  Engaged in toilet transfers w/c <> drop arm BSC in ADL apt with discussion of current setup at home and modifications if unable to obtain a drop arm commode.  Mod cues for setup and positioning of w/c along BSC to increase safety and management of leg rests.  Min guard squat pivot to/from [facility] with pt demonstrating good clearance of buttocks.  Utilized theraband to simulate clothing management with lateral leans with pt able to complete with cues only for clothing management.  Discussed d/c plan with pt reporting they are still trying to work something out.  Pt propelled w/c back to room >200 feet without assist or rest break.  Therapy Documentation Precautions:  Precautions Precautions: Fall Precaution Comments: wound vac Required Braces or Orthoses: Other Brace/Splint Other Brace/Splint: has prosthetic for RLE Restrictions Weight Bearing Restrictions: Yes LLE Weight  Bearing: Non weight bearing Other Position/Activity Restrictions: LLE NWB General:   Vital Signs: Therapy Vitals Temp: 99.6 F (37.6 C) Temp Source: Oral Pulse Rate: 79 Resp: 20 BP: (!) 140/53 mmHg Patient Position (if appropriate): Sitting Oxygen Therapy SpO2: 100 % O2 Device: Not Delivered Pain: Pain Assessment Pain Assessment: 0-10 Pain Score: 7  Pain Type: Acute pain Pain Location: Leg Pain Orientation: Left Pain Descriptors / Indicators: Throbbing Pain Onset: Gradual Pain Intervention(s): Medication (See eMAR) ADL: ADL ADL Comments: see Functional Assessment Tool  See Function Navigator for Current Functional Status.   Therapy/Group: Individual Therapy  Simonne Come 12/14/2015, 3:20 PM

## 2015-12-14 NOTE — Progress Notes (Signed)
Pharmacy Antibiotic Note  Carolyn Valenzuela is a 65 y.o. female admitted on 12/04/2015 now on vancomycin and zosyn for rule out sepsis. ESRD on HD MWF. Afebrile, WBC remains elevated at 17.4. 4/13 LA 2.4. Van 1g load given with HD on 04/12. Last HD 4/14.   Plan: -Continue Vancomycin 500mg  IV qHD -Continue Zosyn 2.25g IV q8 -F/U cx results, trend clinical status, pre-HD level when appropriate   Weight: 110 lb 10.7 oz (50.2 kg)  Temp (24hrs), Avg:99.9 F (37.7 C), Min:99 F (37.2 C), Max:101.4 F (38.6 C)   Recent Labs Lab 12/09/15 1854 12/11/15 0736 12/11/15 2359 12/12/15 0145 12/12/15 0429 12/13/15 0531 12/13/15 1500  WBC  --  14.1* 18.6*  --  17.8* 15.0* 17.4*  CREATININE 6.08* 6.66* 3.58*  --   --  5.97* 6.26*  LATICACIDVEN  --   --  2.8* 2.4*  --   --   --     Estimated Creatinine Clearance: 6.3 mL/min (by C-G formula based on Cr of 6.26).    Allergies  Allergen Reactions  . Oxycodone Itching    Antimicrobials this admission: Vanc 04/12 >> Zosyn 04/12 >>  Microbiology results: Blood cx x2 04/12 >> NGTD Urine 4/12 >> 20K lactobacillus  Thank you for allowing pharmacy to be a part of this patient's care.  Stephens November, PharmD Clinical Pharmacy Resident 10:11 AM, 12/14/2015

## 2015-12-15 ENCOUNTER — Inpatient Hospital Stay (HOSPITAL_COMMUNITY): Payer: BC Managed Care – PPO

## 2015-12-15 LAB — GLUCOSE, CAPILLARY
Glucose-Capillary: 80 mg/dL (ref 65–99)
Glucose-Capillary: 148 mg/dL — ABNORMAL HIGH (ref 65–99)
Glucose-Capillary: 195 mg/dL — ABNORMAL HIGH (ref 65–99)
Glucose-Capillary: 220 mg/dL — ABNORMAL HIGH (ref 65–99)

## 2015-12-15 LAB — PROCALCITONIN: Procalcitonin: 68.84 ng/mL

## 2015-12-15 NOTE — Progress Notes (Signed)
Occupational Therapy Session Note  Patient Details  Name: Carolyn Valenzuela MRN: [id number] Date of Birth: 06-13-1951  Today's Date: 12/15/2015 OT Group Time: 1300-1400 OT Group Time Calculation (min): 60 min  Short Term Goals: Week 2:  OT Short Term Goal 1 (Week 2): Pt will complete 3 of 3 toileting tasks with only supervision assist. OT Short Term Goal 2 (Week 2): Pt will demo ability to retrieve items from kitchen at w/c level independently in prep for simple meal. OT Short Term Goal 3 (Week 2): Pt will don/doff prosthesis sitting at EOB independently OT Short Term Goal 4 (Week 2): Pt will verbalize 4 compensatory strategies to simplify performance of BADL/iADL s/p discharge (ie wear gowns vs pants, use convenience meals, delegate iADL, etc.) OT Short Term Goal 5 (Week 2): Pt will complete upper body HEP using written instructions with supervision for technique  Skilled Therapeutic Interventions/Progress Updates: Therapeutic group activity with focus on functional transfers (toilet, w/c, raised mat), dynamic sitting balance, BUE strengthening, and discharge planning.  Pt completes transfer from w/c to/from raised mat and completes 40 reps of string ball toss with group participant demonstrating good unsupported dynamic sitting balance with supervision for safety.   Pt completes transfer on/off toilet, performing toileting with mod assist to manage clothing.   Pt participates in group discussion relating to need for change in clothing choices to simplify BADL, home mods, and delegation of home making.     Therapy Documentation Precautions:  Precautions Precautions: Fall Precaution Comments: wound vac Required Braces or Orthoses: Other Brace/Splint Other Brace/Splint: has prosthetic for RLE Restrictions Weight Bearing Restrictions: Yes LLE Weight Bearing: Non weight bearing Other Position/Activity Restrictions: LLE NWB   Vital Signs: Therapy Vitals Temp: 98 F (36.7 C) Temp Source:  Oral Pulse Rate: 79 Resp: 18 BP: 130/62 mmHg Patient Position (if appropriate): Sitting Oxygen Therapy SpO2: 98 % O2 Device: Not Delivered   Pain: No/denies pain    ADL: ADL ADL Comments: see Functional Assessment Tool   See Function Navigator for Current Functional Status.   Therapy/Group: Group Therapy  Kaeleen Odom 12/15/2015, 3:33 PM

## 2015-12-15 NOTE — Progress Notes (Signed)
Subjective: Interval History: has no complaint .  Objective: Vital signs in last 24 hours: Temp:  [99.6 F (37.6 C)-100.3 F (37.9 C)] 100.3 F (37.9 C) (04/16 0511) Pulse Rate:  [79-81] 81 (04/16 0511) Resp:  [17-20] 17 (04/16 0511) BP: (140-155)/(52-53) 155/52 mmHg (04/16 0511) SpO2:  [96 %-100 %] 96 % (04/16 0511) Weight:  [52.8 kg (116 lb 6.5 oz)] 52.8 kg (116 lb 6.5 oz) (04/16 0511) Weight change: -0.1 kg (-3.5 oz)  Intake/Output from previous day: 04/15 0701 - 04/16 0700 In: 720 [P.O.:720] Out: -  Intake/Output this shift: Total I/O In: 240 [P.O.:240] Out: -   General appearance: alert, cooperative and no distress Resp: clear to auscultation bilaterally Cardio: S1, S2 normal and systolic murmur: systolic ejection 2/6, decrescendo at 2nd left intercostal space Female genitalia: mistake Extremities: bilat bka, new on L , AVF LUA Abdm soft,pos bs, nontender Lab Results:  Recent Labs  12/13/15 0531 12/13/15 1500  WBC 15.0* 17.4*  HGB 9.2* 8.7*  HCT 28.0* 27.1*  PLT 309 323   BMET:  Recent Labs  12/13/15 0531 12/13/15 1500  NA 133* 130*  K 3.5 3.8  CL 91* 90*  CO2 28 26  GLUCOSE 126* 191*  BUN 47* 52*  CREATININE 5.97* 6.26*  CALCIUM 8.5* 8.6*   No results for input(s): PTH in the last 72 hours. Iron Studies: No results for input(s): IRON, TIBC, TRANSFERRIN, FERRITIN in the last 72 hours.  Studies/Results: No results found.  I have reviewed the patient's current medications.  Assessment/Plan: 1 ESRD for HD tomorrow. 2 HTN controlled 3 Anemai esa/Fe 4 HPTH vit D 5 PVD 6 DM controlled 7 Debill P REhab, HD, esa,     LOS: 11 days   Carolyn Valenzuela L 12/15/2015,8:17 AM

## 2015-12-15 NOTE — Progress Notes (Signed)
[facility] PHYSICAL MEDICINE & REHABILITATION     PROGRESS NOTE  Subjective/Complaints:  Complains of mild to moderate stump pain. Denies fever/chills  ROS:  + Occasional pain in left stump. Denies CP, SOB, N/V/D.  Objective: Vital Signs: Blood pressure 155/52, pulse 81, temperature 100.3 F (37.9 C), temperature source Oral, resp. rate 17, weight 52.8 kg (116 lb 6.5 oz), SpO2 96 %. No results found.  Recent Labs  12/13/15 0531 12/13/15 1500  WBC 15.0* 17.4*  HGB 9.2* 8.7*  HCT 28.0* 27.1*  PLT 309 323    Recent Labs  12/13/15 0531 12/13/15 1500  NA 133* 130*  K 3.5 3.8  CL 91* 90*  GLUCOSE 126* 191*  BUN 47* 52*  CREATININE 5.97* 6.26*  CALCIUM 8.5* 8.6*   CBG (last 3)   Recent Labs  12/14/15 1648 12/14/15 2035 12/15/15 0644  GLUCAP 172* 218* 80    Wt Readings from Last 3 Encounters:  12/15/15 52.8 kg (116 lb 6.5 oz)  12/04/15 51 kg (112 lb 7 oz)  11/25/15 54.885 kg (121 lb)    Physical Exam:  BP 155/52 mmHg  Pulse 81  Temp(Src) 100.3 F (37.9 C) (Oral)  Resp 17  Wt 52.8 kg (116 lb 6.5 oz)  SpO2 96% Constitutional: She appears well-developed. NAD.  HENT:  Normocephalic and atraumatic.   Eyes: Conjunctivae and EOM are normal. + Murmur Cardiovascular: Normal rate and regular rhythm. Respiratory: Effort normal and breath sounds normal. No respiratory distress. She has no wheezes.  GI: Soft. Bowel sounds are normal. She exhibits no distension. There is no tenderness.  Musculoskeletal: She exhibits mild edema, no tenderness.  Bilateral BKA  Neurological: She is alert and oriented.  Motor: Bilateral upper extremities: 4+/5 proximal to distal Right lower extremity: BKA, 5/5 hip flexion  Left lower extremity: Hip flexion 5/5  Skin: Skin is warm and dry.  left BKA with staples C/D/I.  R-BKA healed well. Psychiatric: She has a normal mood and affect. Her behavior is normal. Thought content normal.   Assessment/Plan: 1. Functional deficits  secondary to to left BKA, with previous right BKA which require 3+ hours per day of interdisciplinary therapy in a comprehensive inpatient rehab setting. Physiatrist is providing close team supervision and 24 hour management of active medical problems listed below. Physiatrist and rehab team continue to assess barriers to discharge/monitor patient progress toward functional and medical goals.  Function:  Bathing Bathing position   Position: Wheelchair/chair at sink  Bathing parts Body parts bathed by patient: Right arm, Left arm, Chest, Abdomen, Front perineal area, Buttocks, Right upper leg, Left upper leg Body parts bathed by helper: Back  Bathing assist Assist Level: Touching or steadying assistance(Pt > 75%)   Set up : To obtain items  Upper Body Dressing/Undressing Upper body dressing   What is the patient wearing?: Bra, Pull over shirt/dress Bra - Perfomed by patient: Thread/unthread right bra strap, Thread/unthread left bra strap, Hook/unhook bra (pull down sports bra) Bra - Perfomed by helper: Thread/unthread left bra strap Pull over shirt/dress - Perfomed by patient: Thread/unthread right sleeve, Thread/unthread left sleeve, Put head through opening, Pull shirt over trunk Pull over shirt/dress - Perfomed by helper: Pull shirt over trunk        Upper body assist Assist Level: Supervision or verbal cues, Set up   Set up : To obtain clothing/put away  Lower Body Dressing/Undressing Lower body dressing Lower body dressing/undressing activity did not occur: Refused What is the patient wearing?: Underwear, Pants Underwear -  Performed by patient: Pull underwear up/down Underwear - Performed by helper: Thread/unthread right underwear leg, Thread/unthread left underwear leg Pants- Performed by patient: Thread/unthread left pants leg, Thread/unthread right pants leg, Pull pants up/down Pants- Performed by helper: Thread/unthread right pants leg, Thread/unthread left pants leg                       Lower body assist Assist for lower body dressing:  (Mod assist)      Toileting Toileting Toileting activity did not occur: N/A (anuric) Toileting steps completed by patient: Adjust clothing prior to toileting, Performs perineal hygiene Toileting steps completed by helper: Adjust clothing after toileting Toileting Assistive Devices: Grab bar or rail  Toileting assist Assist level:  (helper did all tasks, total assist)   Transfers Chair/bed transfer   Chair/bed transfer method: Squat pivot Chair/bed transfer assist level: Moderate assist (Pt 50 - 74%/lift or lower) Chair/bed transfer assistive device: Prosthesis, Armrests     Locomotion Ambulation Ambulation activity did not occur: Safety/medical concerns   Max distance: 15ft Assist level: Touching or steadying assistance (Pt > 75%)   Wheelchair   Type: Manual Max wheelchair distance: 150 Assist Level: Supervision or verbal cues  Cognition Comprehension Comprehension assist level: Follows basic conversation/direction with no assist  Expression Expression assist level: Expresses basic needs/ideas: With no assist  Social Interaction Social Interaction assist level: Interacts appropriately 75 - 89% of the time - Needs redirection for appropriate language or to initiate interaction.  Problem Solving Problem solving assist level: Solves basic problems with no assist  Memory Memory assist level: Recognizes or recalls 90% of the time/requires cueing < 10% of the time    Medical Problem List and Plan: 1. Abnormality of gait, safety, deficits with transfers secondary to left BKA, with previous right BKA.  Continue CIR  Stump shrinker ordered for left BKA 2. DVT Prophylaxis/Anticoagulation: Pharmaceutical: Lovenox 3. Pain Management:Hydrocodone prn for pain, low dose robaxin to help with muscle spasms.  4. Mood: Very motivated to get better and resume independent living. LCSW to follow for evaluation and  support.  5. Neuropsych: This patient is capable of making decisions on her own behalf. 6. Skin/Wound Care: Routine pressure relief measures.   7. Fluids/Electrolytes/Nutrition: Labs being monitored ---hyponateremia/hyperkalemia being managed by HD, appreciate recs.  8. ESRD: On renal diet with 1200 FR. Hemodialysis after therapy sessions on MWF to help with tolerance of therapy.  9. DMT2: Monitor BS ac/hs.  Will remove diabetic retrictions to help with food choices/intake. Use SSI for elevated BS.   Home glipizide 5 mg on 4/8, will decrease to 2.5 on 4/13   Will continue to monitor.  Hyperglycemia in last 48 hours, related to ?SIRS---may need to increase glipizide again if sugars remain elevated today.  10 HTN:   Some improvement 11. Anemia of chronic disease: Continue Aransep weekly.  12. Metabolic bone disease: On renvela.  13. Hypoxia: Improving. Encourage IS.  14. Dysphagia  Patient was scheduled to get workup prior to admission  Per SLP, no significant issues 15. SIRS  Leukocytosis (trending down), continued fevers/ low grade temps  Continue on empiric Vanc/Zosyn for now  UCX negative, stump looks ok  Blood cultures still NGTD  Chest x-ray from 4/12  unremarkable 16. Urinary retention   patient had urinary retention this weekend, but this appears to have resolved and she is in anuric at present.   LOS (Days) 11 A FACE TO FACE EVALUATION WAS PERFORMED  SWARTZ,ZACHARY T 12/15/2015 11:07  AM

## 2015-12-16 ENCOUNTER — Inpatient Hospital Stay (HOSPITAL_COMMUNITY): Payer: BC Managed Care – PPO | Admitting: Physical Therapy

## 2015-12-16 ENCOUNTER — Inpatient Hospital Stay (HOSPITAL_COMMUNITY): Payer: BC Managed Care – PPO

## 2015-12-16 ENCOUNTER — Encounter (HOSPITAL_COMMUNITY): Payer: BC Managed Care – PPO

## 2015-12-16 DIAGNOSIS — N390 Urinary tract infection, site not specified: Secondary | ICD-10-CM

## 2015-12-16 DIAGNOSIS — R7309 Other abnormal glucose: Secondary | ICD-10-CM

## 2015-12-16 LAB — CULTURE, BLOOD (ROUTINE X 2)
Culture: NO GROWTH
Culture: NO GROWTH

## 2015-12-16 LAB — CBC
HCT: 27.4 % — ABNORMAL LOW (ref 36.0–46.0)
Hemoglobin: 8.8 g/dL — ABNORMAL LOW (ref 12.0–15.0)
MCH: 29.3 pg (ref 26.0–34.0)
MCHC: 32.1 g/dL (ref 30.0–36.0)
MCV: 91.3 fL (ref 78.0–100.0)
Platelets: 355 10*3/uL (ref 150–400)
RBC: 3 MIL/uL — ABNORMAL LOW (ref 3.87–5.11)
RDW: 15.8 % — ABNORMAL HIGH (ref 11.5–15.5)
WBC: 9.4 10*3/uL (ref 4.0–10.5)

## 2015-12-16 LAB — IRON AND TIBC
Iron: 27 ug/dL — ABNORMAL LOW (ref 28–170)
Saturation Ratios: 20 % (ref 10.4–31.8)
TIBC: 136 ug/dL — ABNORMAL LOW (ref 250–450)
UIBC: 109 ug/dL

## 2015-12-16 LAB — GLUCOSE, CAPILLARY
Glucose-Capillary: 78 mg/dL (ref 65–99)
Glucose-Capillary: 166 mg/dL — ABNORMAL HIGH (ref 65–99)

## 2015-12-16 LAB — RENAL FUNCTION PANEL
Albumin: 2.2 g/dL — ABNORMAL LOW (ref 3.5–5.0)
Anion gap: 16 — ABNORMAL HIGH (ref 5–15)
BUN: 42 mg/dL — ABNORMAL HIGH (ref 6–20)
CO2: 23 mmol/L (ref 22–32)
Calcium: 8.2 mg/dL — ABNORMAL LOW (ref 8.9–10.3)
Chloride: 89 mmol/L — ABNORMAL LOW (ref 101–111)
Creatinine, Ser: 7.89 mg/dL — ABNORMAL HIGH (ref 0.44–1.00)
GFR calc Af Amer: 6 mL/min — ABNORMAL LOW (ref >60)
GFR calc non Af Amer: 5 mL/min — ABNORMAL LOW (ref >60)
Glucose, Bld: 194 mg/dL — ABNORMAL HIGH (ref 65–99)
Phosphorus: 6 mg/dL — ABNORMAL HIGH (ref 2.5–4.6)
Potassium: 3.4 mmol/L — ABNORMAL LOW (ref 3.5–5.1)
Sodium: 128 mmol/L — ABNORMAL LOW (ref 135–145)

## 2015-12-16 LAB — FERRITIN: Ferritin: 1256 ng/mL — ABNORMAL HIGH (ref 11–307)

## 2015-12-16 MED ORDER — VANCOMYCIN HCL IN DEXTROSE 500-5 MG/100ML-% IV SOLN
INTRAVENOUS | Status: AC
  Administered 2015-12-16: 500 mg
  Filled 2015-12-16: qty 100

## 2015-12-16 NOTE — Progress Notes (Addendum)
Physical Therapy Session Note  Patient Details  Name: Carolyn Valenzuela MRN: [id number] Date of Birth: 1951-07-13  Today's Date: 12/16/2015 PT Individual Time: [id number] PT Individual Time Calculation (min): 62 min   Short Term Goals: Week 2:  PT Short Term Goal 1 (Week 2): STG = LTG due to LOS  Skilled Therapeutic Interventions/Progress Updates:    Pt received in w/c set up at sink with hand off from OT. Pt denies c/o pain & reports she plans to d/c home and her niece's boyfriend's sister plans to stay with her. After completing grooming tasks at sink pt requested to order lunch & quickly call sister. After pt got off of phone she became extremely tearful over personal situation with sister & PT provided therapeutic listening. Pt self propelled w/c from room>gym ~120 ft with BUE & supervision A. Pt able to attach amputee support pad to w/c but had difficulty locking it in place. In gym pt ambulated 25 ft with steady A progressing to Min A 2/2 overall & RLE fatigue. Pt utilized RW & RLE prosthetic during gait and adequately pushed up on BUE to advance RLE forward until pt fatigued. Pt self propelled main gym>rehab apartment & completed squat pivot from w/c>couch and was able to park w/c & manage w/c parts with only supervision. When trying to transfer sit>stand from couch with RW pt had to push from elevated arm rest & required Mod A to complete transfer. Pt then able to transfer w/c<>bed via squat pivot in same manner as noted above; pt also able to demonstrate bed mobility supine<>sit and rolling L<>R with Mod I in apartment bed. Pt then self propelled w/c back to room with supervision A. At end of session pt left in w/c with all needs within reach & PT notified CSW of pt's new d/c plan.  Pt continued to be emotional/tearful during session, but willing to participate.  Therapy Documentation Precautions:  Precautions Precautions: Fall Precaution Comments: wound vac Required Braces or Orthoses:  Other Brace/Splint Other Brace/Splint: has prosthetic for RLE Restrictions Weight Bearing Restrictions: Yes LLE Weight Bearing: Non weight bearing Other Position/Activity Restrictions: LLE NWB Pain: Pain Assessment Pain Assessment: No/denies pain   See Function Navigator for Current Functional Status.   Therapy/Group: Individual Therapy  [name] 12/16/2015, 10:32 AM

## 2015-12-16 NOTE — Progress Notes (Signed)
Occupational Therapy Session Note  Patient Details  Name: Carolyn Valenzuela MRN: [id number] Date of Birth: Sep 27, 1950  Today's Date: 12/16/2015 OT Individual Time:[phone] 47 Min  Short Term Goals: Week 2:  OT Short Term Goal 1 (Week 2): Pt will complete 3 of 3 toileting tasks with only supervision assist. OT Short Term Goal 2 (Week 2): Pt will demo ability to retrieve items from kitchen at w/c level independently in prep for simple meal. OT Short Term Goal 3 (Week 2): Pt will don/doff prosthesis sitting at EOB independently OT Short Term Goal 4 (Week 2): Pt will verbalize 4 compensatory strategies to simplify performance of BADL/iADL s/p discharge (ie wear gowns vs pants, use convenience meals, delegate iADL, etc.) OT Short Term Goal 5 (Week 2): Pt will complete upper body HEP using written instructions with supervision for technique  Skilled Therapeutic Interventions/Progress Updates: ADL-retraining with focus on improved endurance, standing balance, discharge planning, problem-solving, functional mobility.    Pt completes bed mobility and dons prosthesis independently with extra time.   Pt completes transfer to w/c to gather clothing with steadying assist to stabilize equipment and min instructional cues to stand using one hand on RW and the other to push up from EOB.   Pt bathes at sink seated in w/c with setup to expedite session and is now able to stand supported to wash peri-area and buttocks; OT applied EPC cream as per RN request after RN inspects residual wounds.   Pt grooms seated independently until end of session with all needs placed within reach.   Pt accepted recommendation to modify clothing and is now wearing skirts vs pants.     Therapy Documentation Precautions:  Precautions Precautions: Fall Precaution Comments: wound vac Required Braces or Orthoses: Other Brace/Splint Other Brace/Splint: has prosthetic for RLE Restrictions Weight Bearing Restrictions: Yes LLE Weight  Bearing: Non weight bearing Other Position/Activity Restrictions: LLE NWB  Pain: Pain Assessment Pain Assessment: No/denies pain Pain Score: 0-No pain Pain Type: Surgical pain Pain Location: Leg Pain Orientation: Left Pain Descriptors / Indicators: Aching Pain Frequency: Intermittent Pain Onset: Gradual Patients Stated Pain Goal: 4 Pain Intervention(s): Medication (See eMAR)  ADL: ADL ADL Comments: see Functional Assessment Tool  See Function Navigator for Current Functional Status.   Therapy/Group: Individual Therapy   Second session: OT Group Time: 1300-1400 Time Calculation (min):  60 min  Pain Assessment: 60 min  Skilled Therapeutic Interventions: Group activity completed with focus on discharge planning, BUE strengthening, dynamic sitting balance, bathroom transfers to tub bench in standard tub.    Pt participates in 20 minutes of string ball toss with group member, alternating position from unsupported sitting at raised mat and supported sitting in w/c while performing string ball toss to strengthen shoulders and grip.    Pt enjoys task and reports mild soreness in shoulders from prolonged toss.   Pt able to switch grips effectively to engage in bilateral shoulder horizontal abduction toss and vertical toss (unilateral shoulder flexion).     Pt then self-propels w/c to tub room and completes tub bench transfer session using RW to ambulate from doorway to tub bench as she will at home (pt has tub bench already).   Pt endorses recommendation to perform transfer with steadying assist provided by caregiver after discharge.   Pt then returns to her room, self-propelling w/c.      See FIM for current functional status  Therapy/Group: Group Therapy  Cordon Gassett 12/16/2015, 12:54 PM

## 2015-12-16 NOTE — Progress Notes (Signed)
[facility] Progress Note  Assessment/Plan: 1. s/p left BKA - per rehab/Duda 2. ESRD - MWF - HD today Na low- need volume down 3. Anemia - hgb 8.7 trending down cont ESA - check Fe studies 4. Secondary hyperparathyroidism - P 2.2; 2 renvela ac on holdfor now - resume as needed; off hectorol - appears it was not continued when admitted in March; correct Ca now high 10 5. HTN/volume - prior EDW pre BKA was 52 - weights are variable - need accurate post BKA - post HD weight - she can stand with prosthesis 6. Nutrition - alb 2.3 - renal diet/prostat, vit 7. Fever - Afebrile; BC pending CXR neg on empiric Vanc and Zosyn - still with leukocytosis - but no fevers; urine - lactobacillus; feels ok  Myriam Jacobson, PA-C [facility] [phone] 12/16/2015,10:13 AM  LOS: 12 days   Pt seen, examined and agree w A/P as above.  Kelly Splinter MD [facility] pager [phone]    cell [phone] 12/16/2015, 11:43 AM    Subjective:   Feels good. Up at sink bathing Spirits good. Very determined!  Objective Filed Vitals:   12/14/15 1302 12/15/15 0511 12/15/15 1304 12/16/15 0432  BP: 140/53 155/52 130/62 133/58  Pulse: 79 81 79 72  Temp: 99.6 F (37.6 C) 100.3 F (37.9 C) 98 F (36.7 C) 98.4 F (36.9 C)  TempSrc: Oral Oral Oral Oral  Resp: 20 17 18 18   Weight:  52.8 kg (116 lb 6.5 oz)  53.524 kg (118 lb)  SpO2: 100% 96% 98% 100%   Physical Exam General: NAD bathing Heart:RRR 2/6 murmur Lungs: no rales Abdomen: soft NT Extremities: right BKA wearing prosthesis, left BKA wrapped Dialysis Access:  Left upper AVF + bruit  Dialysis Orders: [facility] MWF 4h 52kg preBKA 3K/2.25 bath P4 LUA AVF Hep 2500 Mircera: 50 mcg IV q 2 weeks (11/13/15) Hectoral: 4 mcg IV Q TTS (last dose 11/25/15)  Additional Objective Labs: Basic Metabolic Panel:  Recent Labs Lab 12/09/15 1854  12/11/15 2359 12/13/15 0531 12/13/15 1500  NA 129*  < > 131* 133*  130*  K 3.5  < > 3.2* 3.5 3.8  CL 91*  < > 92* 91* 90*  CO2 26  < > 26 28 26   GLUCOSE 91  < > 403* 126* 191*  BUN 27*  < > 22* 47* 52*  CREATININE 6.08*  < > 3.58* 5.97* 6.26*  CALCIUM 8.4*  < > 8.0* 8.5* 8.6*  PHOS 1.7*  --   --   --  2.2*  < > = values in this interval not displayed. Liver Function Tests:  Recent Labs Lab 12/09/15 1854 12/11/15 2359 12/13/15 1500  AST  --  35  --   ALT  --  10*  --   ALKPHOS  --  90  --   BILITOT  --  0.4  --   PROT  --  6.4*  --   ALBUMIN 2.2* 2.3* 2.3*   CBC:  Recent Labs Lab 12/11/15 0736 12/11/15 2359 12/12/15 0429 12/13/15 0531 12/13/15 1500  WBC 14.1* 18.6* 17.8* 15.0* 17.4*  NEUTROABS 9.2* 16.4* 15.4* 10.8*  --   HGB 9.9* 9.3* 9.0* 9.2* 8.7*  HCT 30.2* 28.0* 28.6* 28.0* 27.1*  MCV 93.2 93.0 93.2 93.0 91.9  PLT 462* 278 294 309 323   Blood Culture    Component Value Date/Time   SDES BLOOD RIGHT ANTECUBITAL 12/11/2015 1615   SPECREQUEST BOTTLES DRAWN AEROBIC AND  ANAEROBIC 10CC 12/11/2015 1615   CULT NO GROWTH 4 DAYS 12/11/2015 1615   REPTSTATUS PENDING 12/11/2015 1615    CBG:  Recent Labs Lab 12/15/15 0644 12/15/15 1139 12/15/15 1628 12/15/15 2129 12/16/15 0629  GLUCAP 80 148* 195* 220* 78   Medications:   . [START ON 12/18/2015] darbepoetin (ARANESP) injection - DIALYSIS  200 mcg Intravenous Q Wed-HD  . famotidine  10 mg Oral Daily  . feeding supplement (NEPRO CARB STEADY)  237 mL Oral TID WC  . gabapentin  100 mg Oral BID  . glipiZIDE  2.5 mg Oral QAC breakfast  . heparin subcutaneous  5,000 Units Subcutaneous 3 times per day  . insulin aspart  0-10 Units Subcutaneous TID WC & HS  . multivitamin  1 tablet Oral QHS  . piperacillin-tazobactam (ZOSYN)  IV  2.25 g Intravenous Q8H  . saccharomyces boulardii  250 mg Oral BID  . senna-docusate  2 tablet Oral BID  . simvastatin  20 mg Oral q1800  . vancomycin  500 mg Intravenous Q M,W,F-HD

## 2015-12-16 NOTE — Progress Notes (Signed)
[facility]     PROGRESS NOTE  Subjective/Complaints:  Patient sitting up in bed this morning. She states she is feeling well, but doesn't like her diet.  ROS:   Denies CP, SOB, N/V/D.  Objective: Vital Signs: Blood pressure 133/58, pulse 72, temperature 98.4 F (36.9 C), temperature source Oral, resp. rate 18, weight 53.524 kg (118 lb), SpO2 100 %. No results found.  Recent Labs  12/13/15 1500  WBC 17.4*  HGB 8.7*  HCT 27.1*  PLT 323    Recent Labs  12/13/15 1500  NA 130*  K 3.8  CL 90*  GLUCOSE 191*  BUN 52*  CREATININE 6.26*  CALCIUM 8.6*   CBG (last 3)   Recent Labs  12/15/15 1628 12/15/15 2129 12/16/15 0629  GLUCAP 195* 220* 78    Wt Readings from Last 3 Encounters:  12/16/15 53.524 kg (118 lb)  12/04/15 51 kg (112 lb 7 oz)  11/25/15 54.885 kg (121 lb)    Physical Exam:  BP 133/58 mmHg  Pulse 72  Temp(Src) 98.4 F (36.9 C) (Oral)  Resp 18  Wt 53.524 kg (118 lb)  SpO2 100% Constitutional: She appears well-developed. NAD.  HENT:  Normocephalic and atraumatic.   Eyes: Conjunctivae and EOM are normal. + Murmur Cardiovascular: Normal rate and regular rhythm. Respiratory: Effort normal and breath sounds normal. No respiratory distress. She has no wheezes.  GI: Soft. Bowel sounds are normal. She exhibits no distension. There is no tenderness.  Musculoskeletal: She exhibits mild edema, no tenderness.  Bilateral BKA  Neurological: She is alert and oriented.  Motor: Bilateral upper extremities: 4+/5 proximal to distal Right lower extremity: BKA, 5/5 hip flexion  Left lower extremity: Hip flexion 5/5  Skin: Skin is warm and dry.  left BKA with staples C/D/I.  R-BKA healed well. Psychiatric: She has a normal mood and affect. Her behavior is normal. Thought content normal.   Assessment/Plan: 1. Functional deficits secondary to to left BKA, with previous right BKA which require 3+ hours per day of interdisciplinary  therapy in a comprehensive inpatient rehab setting. Physiatrist is providing close team supervision and 24 hour management of active medical problems listed below. Physiatrist and rehab team continue to assess barriers to discharge/monitor patient progress toward functional and medical goals.  Function:  Bathing Bathing position   Position: Wheelchair/chair at sink  Bathing parts Body parts bathed by patient: Right arm, Left arm, Chest, Abdomen, Front perineal area, Buttocks, Right upper leg, Left upper leg Body parts bathed by helper: Back  Bathing assist Assist Level: Touching or steadying assistance(Pt > 75%)   Set up : To obtain items  Upper Body Dressing/Undressing Upper body dressing   What is the patient wearing?: Bra, Pull over shirt/dress Bra - Perfomed by patient: Thread/unthread right bra strap, Thread/unthread left bra strap, Hook/unhook bra (pull down sports bra) Bra - Perfomed by helper: Thread/unthread left bra strap Pull over shirt/dress - Perfomed by patient: Thread/unthread right sleeve, Thread/unthread left sleeve, Put head through opening, Pull shirt over trunk Pull over shirt/dress - Perfomed by helper: Pull shirt over trunk        Upper body assist Assist Level: Supervision or verbal cues, Set up   Set up : To obtain clothing/put away  Lower Body Dressing/Undressing Lower body dressing Lower body dressing/undressing activity did not occur: Refused What is the patient wearing?: Underwear, Pants Underwear - Performed by patient: Pull underwear up/down Underwear - Performed by helper: Thread/unthread right underwear leg, Thread/unthread left  underwear leg Pants- Performed by patient: Thread/unthread left pants leg, Thread/unthread right pants leg, Pull pants up/down Pants- Performed by helper: Thread/unthread right pants leg, Thread/unthread left pants leg                      Lower body assist Assist for lower body dressing:  (Mod assist)       Toileting Toileting Toileting activity did not occur: N/A (anuric) Toileting steps completed by patient: Adjust clothing prior to toileting, Performs perineal hygiene Toileting steps completed by helper: Adjust clothing after toileting Toileting Assistive Devices: Grab bar or rail  Toileting assist Assist level:  (helper did all tasks, total assist)   Transfers Chair/bed transfer   Chair/bed transfer method: Squat pivot Chair/bed transfer assist level: Touching or steadying assistance (Pt > 75%) Chair/bed transfer assistive device: Prosthesis     Locomotion Ambulation Ambulation activity did not occur: Safety/medical concerns   Max distance: 53ft Assist level: Touching or steadying assistance (Pt > 75%)   Wheelchair   Type: Manual Max wheelchair distance: 150 Assist Level: Supervision or verbal cues  Cognition Comprehension Comprehension assist level: Follows basic conversation/direction with no assist  Expression Expression assist level: Expresses basic needs/ideas: With no assist  Social Interaction Social Interaction assist level: Interacts appropriately 75 - 89% of the time - Needs redirection for appropriate language or to initiate interaction.  Problem Solving Problem solving assist level: Solves basic problems with no assist  Memory Memory assist level: Recognizes or recalls 90% of the time/requires cueing < 10% of the time    Medical Problem List and Plan: 1. Abnormality of gait, safety, deficits with transfers secondary to left BKA, with previous right BKA.  Continue CIR  Stump shrinker ordered for left BKA 2. DVT Prophylaxis/Anticoagulation: Pharmaceutical: Lovenox 3. Pain Management:Hydrocodone prn for pain, low dose robaxin to help with muscle spasms.  4. Mood: Very motivated to get better and resume independent living. LCSW to follow for evaluation and support.  5. Neuropsych: This patient is capable of making decisions on her own behalf. 6. Skin/Wound  Care: Routine pressure relief measures.   7. Fluids/Electrolytes/Nutrition: Labs being monitored ---hyponateremia/hyperkalemia being managed by HD, appreciate recs.  8. ESRD: On renal diet with 1200 FR. Hemodialysis after therapy sessions on MWF to help with tolerance of therapy.  9. DMT2: Monitor BS ac/hs.  Will remove diabetic retrictions to help with food choices/intake. Use SSI for elevated BS.   Home glipizide 5 mg on 4/8, will decrease to 2.5 on 4/13   Will continue to monitor. 10 HTN:   Labile at present, will not start any medication to avoid hypotension during dialysis 11. Anemia of chronic disease: Continue Aransep weekly.  12. Metabolic bone disease: On renvela.  13. Hypoxia: Improving. Encourage IS.  14. Dysphagia  Patient was scheduled to get workup prior to admission  Per SLP, no significant issues 15. Urosepsis  Leukocytosis (trending down), continued fevers/ low grade temps  Continue on empiric Vanc/Zosyn  Stump does not show signs of infection  Blood cultures still NGTD  Chest x-ray from 4/12  Unremarkable  UCX with lactobacillus 16. Urinary retention   patient had urinary retention this weekend, but this appears to have resolved and she is in anuric at present.   LOS (Days) 12 A FACE TO FACE EVALUATION WAS PERFORMED  Shana Zavaleta Lorie Phenix 12/16/2015 9:42 AM

## 2015-12-17 ENCOUNTER — Inpatient Hospital Stay (HOSPITAL_COMMUNITY): Payer: BC Managed Care – PPO

## 2015-12-17 ENCOUNTER — Inpatient Hospital Stay (HOSPITAL_COMMUNITY): Payer: BC Managed Care – PPO | Admitting: Physical Therapy

## 2015-12-17 LAB — GLUCOSE, CAPILLARY
Glucose-Capillary: 141 mg/dL — ABNORMAL HIGH (ref 65–99)
Glucose-Capillary: 153 mg/dL — ABNORMAL HIGH (ref 65–99)
Glucose-Capillary: 83 mg/dL (ref 65–99)
Glucose-Capillary: 166 mg/dL — ABNORMAL HIGH (ref 65–99)
Glucose-Capillary: 138 mg/dL — ABNORMAL HIGH (ref 65–99)

## 2015-12-17 MED ORDER — SODIUM CHLORIDE 0.9 % IV SOLN: 100.0000 mL | INTRAVENOUS | Status: DC | PRN

## 2015-12-17 MED ORDER — LIDOCAINE-PRILOCAINE 2.5-2.5 % EX CREA
1.0000 "application " | TOPICAL_CREAM | CUTANEOUS | Status: DC | PRN
  Filled 2015-12-17: qty 5

## 2015-12-17 MED ORDER — SENNA 8.6 MG PO TABS
2.0000 | ORAL_TABLET | Freq: Two times a day (BID) | ORAL | Status: DC
  Administered 2015-12-17 – 2015-12-20 (×5): 17.2 mg via ORAL
  Filled 2015-12-17 (×5): qty 2

## 2015-12-17 MED ORDER — LIDOCAINE HCL (PF) 1 % IJ SOLN
5.0000 mL | INTRAMUSCULAR | Status: DC | PRN
  Filled 2015-12-17: qty 5

## 2015-12-17 MED ORDER — HEPARIN SODIUM (PORCINE) 1000 UNIT/ML DIALYSIS
1000.0000 [IU] | INTRAMUSCULAR | Status: DC | PRN
  Filled 2015-12-17: qty 1

## 2015-12-17 MED ORDER — ALTEPLASE 2 MG IJ SOLR: 2.0000 mg | Freq: Once | INTRAMUSCULAR | Status: DC | PRN

## 2015-12-17 MED ORDER — CALCIUM POLYCARBOPHIL 625 MG PO TABS
625.0000 mg | ORAL_TABLET | Freq: Two times a day (BID) | ORAL | Status: DC
  Administered 2015-12-17 – 2015-12-20 (×6): 625 mg via ORAL
  Filled 2015-12-17 (×7): qty 1

## 2015-12-17 MED ORDER — SODIUM CHLORIDE 0.9 % IV SOLN
125.0000 mg | INTRAVENOUS | Status: DC
  Administered 2015-12-18 – 2015-12-20 (×2): 125 mg via INTRAVENOUS
  Filled 2015-12-17 (×4): qty 10

## 2015-12-17 MED ORDER — HEPARIN SODIUM (PORCINE) 1000 UNIT/ML DIALYSIS
100.0000 [IU]/kg | INTRAMUSCULAR | Status: DC | PRN
  Filled 2015-12-17: qty 6

## 2015-12-17 MED ORDER — AMLODIPINE BESYLATE 2.5 MG PO TABS
2.5000 mg | ORAL_TABLET | Freq: Every day | ORAL | Status: DC
  Administered 2015-12-17 – 2015-12-19 (×2): 2.5 mg via ORAL
  Filled 2015-12-17 (×3): qty 1

## 2015-12-17 MED ORDER — PENTAFLUOROPROP-TETRAFLUOROETH EX AERO: 1.0000 "application " | INHALATION_SPRAY | CUTANEOUS | Status: DC | PRN

## 2015-12-17 NOTE — Progress Notes (Signed)
Nutrition Follow-up  DOCUMENTATION CODES:   Not applicable  INTERVENTION:  Continue Nepro Shake po TID, each supplement provides 425 kcal and 19 grams protein.  Provide nourishment snacks (Ordered).   Encourage adequate PO intake.   NUTRITION DIAGNOSIS:   Increased nutrient needs related to chronic illness as evidenced by estimated needs; ongoing  GOAL:   Patient will meet greater than or equal to 90% of their needs; met  MONITOR:   PO intake, Supplement acceptance, Weight trends, Labs, I & O's, Skin  REASON FOR ASSESSMENT:   Consult  (snacks)  ASSESSMENT:   65 y.o. 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 She was admitted on 11/29/15 for L-BKA by Dr. Sharol Given.Continuing Prevana wound VAC for 1 week post surgery.  Meal completion has been 50-100% with most intake at 100%. Pt has Nepro Shake ordered with varied consumption. RD to continue with current orders. Nursing staff to provide HS snack daily. Snack has been ordered. Plans for discharge 4/21.   Labs: Low sodium, potassium, chloride, calcium, GFR. High BUN, creatinine, phosphorous (6.0).  Diet Order:  Diet renal with fluid restriction Fluid restriction:: 1200 mL Fluid; Room service appropriate?: Yes; Fluid consistency:: Thin  Skin:   (Large blister on buttocks, bilateral BKA)  Last BM:  4/18  Height:   Ht Readings from Last 1 Encounters:  11/29/15 _0  (1.499 m)    Weight:   Wt Readings from Last 1 Encounters:  12/17/15 115 lb 4.8 oz (52.3 kg)    Ideal Body Weight:  38.8 kg (adjusted for bilateral BKA)  BMI:  Body mass index is 23.28 kg/(m^2).  Estimated Nutritional Needs:   Kcal:  1450-1650  Protein:  65-75 grams  Fluid:  1.2 L/day  EDUCATION NEEDS:   No education needs identified at this time  Corrin Parker, MS, RD, LDN Pager # [phone] After hours/ weekend pager # [phone]

## 2015-12-17 NOTE — Progress Notes (Signed)
Occupational Therapy Session Note  Patient Details  Name: TEALE YOCK MRN: [id number] Date of Birth: 07-Nov-1950  Today's Date: 12/17/2015 OT Individual Time: [id number] OT Individual Time Calculation (min): 45 min    Short Term Goals: Week 2:  OT Short Term Goal 1 (Week 2): Pt will complete 3 of 3 toileting tasks with only supervision assist. OT Short Term Goal 2 (Week 2): Pt will demo ability to retrieve items from kitchen at w/c level independently in prep for simple meal. OT Short Term Goal 3 (Week 2): Pt will don/doff prosthesis sitting at EOB independently OT Short Term Goal 4 (Week 2): Pt will verbalize 4 compensatory strategies to simplify performance of BADL/iADL s/p discharge (ie wear gowns vs pants, use convenience meals, delegate iADL, etc.) OT Short Term Goal 5 (Week 2): Pt will complete upper body HEP using written instructions with supervision for technique  Skilled Therapeutic Interventions/Progress Updates: ADL-retraining at shower level with focus on tub bench transfer and functional steps/hops using RW.   Pt advised to shower to improve wound care at buttocks and agrees to use bench in ADL apartment after setup to provide supplies.  Pt hops to bench from doorway of bathroom and completes transfer with min guard assist for safety.   Pt enjoys task and requires assist only to more thoroughly wash buttocks and back.    Pt recovers to w/c after donning prosthesis sitting at edge of bench.  Pt is escorted back to her room and dresses seated at sink and required mod assist to pull up underwear and pants d/t fatigue and abbreviated session (45 min).   Pt left at sink to don bra, shirt and groom at end of session.     Therapy Documentation Precautions:  Precautions Precautions: Fall Precaution Comments: wound vac Required Braces or Orthoses: Other Brace/Splint Other Brace/Splint: has prosthetic for RLE Restrictions Weight Bearing Restrictions: Yes LLE Weight Bearing: Non  weight bearing Other Position/Activity Restrictions: LLE NWB  Pain: Pain Assessment Pain Assessment: No/denies pain  ADL: ADL ADL Comments: see Functional Assessment Tool   See Function Navigator for Current Functional Status.   Therapy/Group: Individual Therapy  Ilithyia Titzer 12/17/2015, 9:15 AM

## 2015-12-17 NOTE — Progress Notes (Signed)
Vancomycin and zosyn per pharmacy  ID: Vancomycin and zosyn for r/o sepsis. Patient has ESRD on HD MWF. Afebrile, WBC better at 9.4 K on 04/17. 4/13 LA 2.4, PCT 116.15>68.84. Van 1g load given with HD on 04/12. Last HD 4/17  Goal of Therapy:  Pre- HD Vancomycin level is 15-25   Vanc 04/12 >> Zosyn 04/12 >>  Blood cx x2 04/12 >> NGTD Urine 4/12 >> 20K lactobacillus  Plan:  -Continue Vancomycin 500mg  IV qHD.  If BFR continues to be on lower side, consider checking pre-HD vanc level. -Continue Zosyn 2.25g IV q8 -F/U cx results, trend clinical status

## 2015-12-17 NOTE — Progress Notes (Signed)
Physical Therapy Session Note  Patient Details  Name: Carolyn Valenzuela MRN: [id number] Date of Birth: 27-Mar-1951  Today's Date: 12/17/2015 PT Individual Time: 1000-1105 and [id number] PT Individual Time Calculation (min): 65 min and 86 minutes  Short Term Goals: Week 2:  PT Short Term Goal 1 (Week 2): STG = LTG due to LOS  Skilled Therapeutic Interventions/Progress Updates:    Treatment 1:  Pt received in w/c with RN present & pt agreeable to PT. Pt reports 3/10 medial aspect of L distal residual limb, "where the stapes are at". While RN administered medication PT discussed pt receiving shrinker for LLE & benefits of it versus Ace wrap. Also discussed family member coming in for family training & pt reported they would be in later this evening. Educated pt on importance of family/friend coming in during morning or early afternoon to ensure therapist could provide education. Pt contacted sister & she reports they would be present for afternoon PT session. Pt frustrated with diet & PT assisted pt with ordering lunch & education on diet. Pt able to self propel w/c from room>central unit of hospital with minimal cuing to push drive wheels simultaneously with BUE instead of one at a time. Pt negotiated ramp with multiple trials. Pt able to ascend ramp with supervision but required min A fade to supervision for descending ramp. Pt attempting to produce small pushes down ramp & PT instructed pt to hold drive wheels and tighten or loosen her grip based on desired speed. Pt with improving ability to control w/c when descending ramps. Returned to gym & pt transferred sit>stand with supervision but once in standing presented with what appeared to be a head tremor. Pt reported she did not feel well, and returned to sitting in w/c but then reported she was just tired as she did not return from dialysis until 1AM this morning. Pt's BP then = 182/71 mmHg. PT returned pt to room & pt completed squat pivot w/c>bed with  supervision. Once supine pt's BP = 176/59 mmHg. Pt left in bed with all needs within reach & PT notified of pt's apparent head tremor & elevated BP.   Treatment 2: Pt received in bed, with sister [name] present but excusing herself at beginning of session. Pt reported 5-6/10 LLE pain & requested pain medication; Pt agreeable to PT. Pt transferred supine>sitting at EOB with Mod I & bed features and required assistance to don prosthetic leg to ensure it clicked into place, but pt able to don liner & sock. PT rewrapped pt's L residual limb & pt reported pain on tibial plateau with PT observing dark spot on skin; PT notified RN. Pt's sister [name] & roommate Anderson Malta arrived during session for family training. PT educated pt & Anderson Malta on shrinker for at home use (pt has not yet received shrinker) & pt reports feeling comfortable with knowing how to don a shrinker. Anderson Malta also practiced assisting pt with donning prosthetic leg & reports comfort with task. Pt reported need to use restroom and completed squat pivot transfer bed>w/c with set up only. PT transported pt in bathroom for time management & pt completed squat pivot transfer to toilet & able to pull down underwear with 1 UE support, (+) void & BM per patient. Pt completed transfer back to w/c in same manner as previously noted & required encouragement to pull underwear up herself as she began to request roommate assist her. PT educated pt on need to perform as many functional activities for herself as possible &  pt agreeable. PT educated pt & Anderson Malta on proper set up of w/c by bed/recliner/BSC/etc. To allow pt to complete squat/stand pivot transfer. Pt & Anderson Malta demonstrated transfer recliner>bed & reported comfort with task. Pt self propelled w/c from room>gym with BUE & mod I in controlled environment. In gym PT instructed pt & family on pushing pt up a ramp and negotiating single step. Pt's sister [name] reported she prefers to pull patient backwards up  step & descend step forwards. PT instructed pt & family on need to ascend step forwards & descend backwards because this ensures pt will not fall out of w/c on steep step & allows assistant to produce more force & strength with BLE. Pt & Anderson Malta negotiated ramp & single step & both report comfort with task. Pt then ambulated 10 ft x 2 trials with RW with PT & then with Anderson Malta. Jennifer instructed on use of gait belt & how to hold on to patient. Pt able to ambulate with steady A but fatigues quickly with task. PT instructed pt & Anderson Malta on safe method to back up to chair as pt has difficulty hopping backwards to chair. Educated pt on need to reach back for seat to ensure she is close to it & safe to sit. PT discussed pt's need to ambulate into bathroom with RW during the day, but she should use BSC at night for increased safety & pt agreeable to this. Pt reports all other areas of her house are w/c accessible. Pt & family reported no concerns & comfort with family education completed on this date. At end of session pt left in w/c with family present.   Therapy Documentation Precautions:  Precautions Precautions: Fall Precaution Comments: wound vac Required Braces or Orthoses: Other Brace/Splint Other Brace/Splint: has prosthetic for RLE Restrictions Weight Bearing Restrictions: Yes LLE Weight Bearing: Non weight bearing Other Position/Activity Restrictions: LLE NWB Pain: Pain Assessment Pain Assessment: 0-10 Pain Score: 3  Pain Location:  (medial aspect of distal residual limb "where the staples are at") Pain Intervention(s): RN made aware;Ambulation/increased activity;Repositioned   See Function Navigator for Current Functional Status.   Therapy/Group: Individual Therapy  [name] 12/17/2015, 10:01 AM

## 2015-12-17 NOTE — Progress Notes (Signed)
Carolyn Valenzuela PHYSICAL MEDICINE & REHABILITATION     PROGRESS NOTE  Subjective/Complaints:  Sitting up in bed this morning. She notes she is tired because he returned from hemodialysis and 1 AM last night.  ROS:   Denies CP, SOB, N/V/D.  Objective: Vital Signs: Blood pressure 122/49, pulse 74, temperature 99.2 F (37.3 C), temperature source Oral, resp. rate 16, weight 52.3 kg (115 lb 4.8 oz), SpO2 100 %. No results found.  Recent Labs  12/16/15 2103  WBC 9.4  HGB 8.8*  HCT 27.4*  PLT 355    Recent Labs  12/16/15 2104  NA 128*  K 3.4*  CL 89*  GLUCOSE 194*  BUN 42*  CREATININE 7.89*  CALCIUM 8.2*   CBG (last 3)   Recent Labs  12/16/15 1136 12/17/15 0135 12/17/15 0638  GLUCAP 166* 141* 153*    Wt Readings from Last 3 Encounters:  12/17/15 52.3 kg (115 lb 4.8 oz)  12/04/15 51 kg (112 lb 7 oz)  11/25/15 54.885 kg (121 lb)    Physical Exam:  BP 122/49 mmHg  Pulse 74  Temp(Src) 99.2 F (37.3 C) (Oral)  Resp 16  Wt 52.3 kg (115 lb 4.8 oz)  SpO2 100% Constitutional: She appears well-developed. NAD.  HENT:  Normocephalic and atraumatic.   Eyes: Conjunctivae and EOM are normal. + Murmur Cardiovascular: Normal rate and regular rhythm. Respiratory: Effort normal and breath sounds normal. No respiratory distress. She has no wheezes.  GI: Soft. Bowel sounds are normal. She exhibits no distension. There is no tenderness.  Musculoskeletal: She exhibits mild edema, no tenderness.  Bilateral BKA  Neurological: She is alert and oriented.  Motor: Bilateral upper extremities: 4+/5 proximal to distal Right lower extremity: BKA, 5/5 hip flexion  Left lower extremity: Hip flexion 5/5  Skin: Skin is warm and dry.  left BKA with staples C/D/I.  R-BKA healed well. Psychiatric: She has a normal mood and affect. Her behavior is normal. Thought content normal.   Assessment/Plan: 1. Functional deficits secondary to to left BKA, with previous right BKA which require 3+  hours per day of interdisciplinary therapy in a comprehensive inpatient rehab setting. Physiatrist is providing close team supervision and 24 hour management of active medical problems listed below. Physiatrist and rehab team continue to assess barriers to discharge/monitor patient progress toward functional and medical goals.  Function:  Bathing Bathing position   Position: Wheelchair/chair at sink  Bathing parts Body parts bathed by patient: Right arm, Left arm, Chest, Abdomen, Front perineal area, Buttocks, Right upper leg, Left upper leg Body parts bathed by helper: Back  Bathing assist Assist Level: Touching or steadying assistance(Pt > 75%)   Set up : To obtain items  Upper Body Dressing/Undressing Upper body dressing   What is the patient wearing?: Bra, Pull over shirt/dress Bra - Perfomed by patient: Thread/unthread right bra strap, Thread/unthread left bra strap, Hook/unhook bra (pull down sports bra) Bra - Perfomed by helper: Thread/unthread left bra strap Pull over shirt/dress - Perfomed by patient: Thread/unthread left sleeve, Thread/unthread right sleeve, Put head through opening, Pull shirt over trunk Pull over shirt/dress - Perfomed by helper: Pull shirt over trunk        Upper body assist Assist Level: Set up   Set up : To obtain clothing/put away  Lower Body Dressing/Undressing Lower body dressing Lower body dressing/undressing activity did not occur: Refused What is the patient wearing?: Underwear Underwear - Performed by patient: Thread/unthread right underwear leg, Thread/unthread left underwear leg, Pull underwear  up/down Underwear - Performed by helper: Thread/unthread right underwear leg, Thread/unthread left underwear leg Pants- Performed by patient: Thread/unthread left pants leg, Thread/unthread right pants leg, Pull pants up/down Pants- Performed by helper: Thread/unthread right pants leg, Thread/unthread left pants leg                      Lower  body assist Assist for lower body dressing: Supervision or verbal cues      Toileting Toileting Toileting activity did not occur: N/A (anuric) Toileting steps completed by patient: Adjust clothing prior to toileting, Performs perineal hygiene, Adjust clothing after toileting Toileting steps completed by helper: Adjust clothing after toileting Toileting Assistive Devices: Grab bar or rail  Toileting assist Assist level: Supervision or verbal cues   Transfers Chair/bed transfer   Chair/bed transfer method: Squat pivot Chair/bed transfer assist level: Supervision or verbal cues Chair/bed transfer assistive device: Armrests     Locomotion Ambulation Ambulation activity did not occur: Safety/medical concerns   Max distance: 25 ft Assist level: Touching or steadying assistance (Pt > 75%)   Wheelchair   Type: Manual Max wheelchair distance: 150 Assist Level: Supervision or verbal cues  Cognition Comprehension Comprehension assist level: Follows basic conversation/direction with no assist  Expression Expression assist level: Expresses basic needs/ideas: With no assist  Social Interaction Social Interaction assist level: Interacts appropriately with others with medication or extra time (anti-anxiety, antidepressant).  Problem Solving Problem solving assist level: Solves basic 90% of the time/requires cueing < 10% of the time  Memory Memory assist level: Recognizes or recalls 90% of the time/requires cueing < 10% of the time    Medical Problem List and Plan: 1. Abnormality of gait, safety, deficits with transfers secondary to left BKA, with previous right BKA.  Continue CIR  Stump shrinker ordered for left BKA, patient does not have 2. DVT Prophylaxis/Anticoagulation: Pharmaceutical: Lovenox 3. Pain Management:Hydrocodone prn for pain, low dose robaxin to help with muscle spasms.  4. Mood: Very motivated to get better and resume independent living. LCSW to follow for evaluation and  support.  5. Neuropsych: This patient is capable of making decisions on her own behalf. 6. Skin/Wound Care: Routine pressure relief measures.   7. Fluids/Electrolytes/Nutrition: Labs being monitored ---hyponateremia/hyperkalemia being managed by HD, appreciate recs.  8. ESRD: On renal diet with 1200 FR. Hemodialysis after therapy sessions on MWF to help with tolerance of therapy.  9. DMT2: Monitor BS ac/hs.  Will remove diabetic retrictions to help with food choices/intake. Use SSI for elevated BS.   Home glipizide 5 mg on 4/8, will decrease to 2.5 on 4/13   Will continue to monitor. 10 HTN:   Labile at present, however, relatively elevated, will start low dose Norvasc on 4/18 11. Anemia of chronic disease: Continue Aransep weekly.  12. Metabolic bone disease: On renvela.  13. Hypoxia: Improving. Encourage IS.  14. Dysphagia  Patient was scheduled to get workup prior to admission  Per SLP, no significant issues 15. Urosepsis  Leukocytosis (resolved), continued fevers/ low grade temps  Continue on empiric Vanc/Zosyn  Stump does not show signs of infection  Blood cultures NG  Chest x-ray from 4/12  Unremarkable  UCX with lactobacillus 16. Urinary retention   patient had urinary retention this weekend, but this appears to have resolved and she is in anuric at present.   LOS (Days) 13 A FACE TO FACE EVALUATION WAS PERFORMED  Ankit Lorie Phenix 12/17/2015 9:12 AM

## 2015-12-17 NOTE — Progress Notes (Signed)
Social Work Patient ID: [name], female   DOB: 1951/08/11, 65 y.o.   MRN: [id number] [facility] insurance update faxed into, will await approval until discharge 4/21.

## 2015-12-18 ENCOUNTER — Inpatient Hospital Stay (HOSPITAL_COMMUNITY): Payer: BC Managed Care – PPO | Admitting: Physical Therapy

## 2015-12-18 ENCOUNTER — Inpatient Hospital Stay (HOSPITAL_COMMUNITY): Payer: BC Managed Care – PPO | Admitting: Occupational Therapy

## 2015-12-18 LAB — RENAL FUNCTION PANEL
Albumin: 2.3 g/dL — ABNORMAL LOW (ref 3.5–5.0)
Anion gap: 14 (ref 5–15)
BUN: 21 mg/dL — ABNORMAL HIGH (ref 6–20)
CO2: 27 mmol/L (ref 22–32)
Calcium: 8 mg/dL — ABNORMAL LOW (ref 8.9–10.3)
Chloride: 92 mmol/L — ABNORMAL LOW (ref 101–111)
Creatinine, Ser: 6.02 mg/dL — ABNORMAL HIGH (ref 0.44–1.00)
GFR calc Af Amer: 8 mL/min — ABNORMAL LOW (ref >60)
GFR calc non Af Amer: 7 mL/min — ABNORMAL LOW (ref >60)
Glucose, Bld: 173 mg/dL — ABNORMAL HIGH (ref 65–99)
Phosphorus: 6.2 mg/dL — ABNORMAL HIGH (ref 2.5–4.6)
Potassium: 3.5 mmol/L (ref 3.5–5.1)
Sodium: 133 mmol/L — ABNORMAL LOW (ref 135–145)

## 2015-12-18 LAB — GLUCOSE, CAPILLARY
Glucose-Capillary: 77 mg/dL (ref 65–99)
Glucose-Capillary: 133 mg/dL — ABNORMAL HIGH (ref 65–99)
Glucose-Capillary: 122 mg/dL — ABNORMAL HIGH (ref 65–99)

## 2015-12-18 LAB — CBC
HCT: 28.4 % — ABNORMAL LOW (ref 36.0–46.0)
Hemoglobin: 9 g/dL — ABNORMAL LOW (ref 12.0–15.0)
MCH: 29 pg (ref 26.0–34.0)
MCHC: 31.7 g/dL (ref 30.0–36.0)
MCV: 91.6 fL (ref 78.0–100.0)
Platelets: 380 10*3/uL (ref 150–400)
RBC: 3.1 MIL/uL — ABNORMAL LOW (ref 3.87–5.11)
RDW: 16.2 % — ABNORMAL HIGH (ref 11.5–15.5)
WBC: 9 10*3/uL (ref 4.0–10.5)

## 2015-12-18 MED ORDER — LIDOCAINE-PRILOCAINE 2.5-2.5 % EX CREA: 1.0000 "application " | TOPICAL_CREAM | CUTANEOUS | Status: DC | PRN

## 2015-12-18 MED ORDER — ALTEPLASE 2 MG IJ SOLR
2.0000 mg | Freq: Once | INTRAMUSCULAR | Status: DC | PRN
Start: 2015-12-18 — End: 2015-12-18

## 2015-12-18 MED ORDER — SODIUM CHLORIDE 0.9 % IV SOLN: 100.0000 mL | INTRAVENOUS | Status: DC | PRN

## 2015-12-18 MED ORDER — LIDOCAINE HCL (PF) 1 % IJ SOLN
5.0000 mL | INTRAMUSCULAR | Status: DC | PRN
  Filled 2015-12-18: qty 5

## 2015-12-18 MED ORDER — HEPARIN SODIUM (PORCINE) 1000 UNIT/ML DIALYSIS
2500.0000 [IU] | Freq: Once | INTRAMUSCULAR | Status: DC
  Filled 2015-12-18: qty 3

## 2015-12-18 MED ORDER — DARBEPOETIN ALFA 200 MCG/0.4ML IJ SOSY
PREFILLED_SYRINGE | INTRAMUSCULAR | Status: AC
  Administered 2015-12-18: 200 ug via INTRAVENOUS
  Filled 2015-12-18: qty 0.4

## 2015-12-18 MED ORDER — HEPARIN SODIUM (PORCINE) 1000 UNIT/ML DIALYSIS
1000.0000 [IU] | INTRAMUSCULAR | Status: DC | PRN
Start: 2015-12-18 — End: 2015-12-18
  Filled 2015-12-18: qty 1

## 2015-12-18 MED ORDER — PENTAFLUOROPROP-TETRAFLUOROETH EX AERO: 1.0000 "application " | INHALATION_SPRAY | CUTANEOUS | Status: DC | PRN

## 2015-12-18 NOTE — Progress Notes (Signed)
[facility] Progress Note  Assessment/Plan: 1. s/p left BKA - per rehab/Duda 2. ESRD - MWF HD. HD today 3. Anemia - hgb 8.7 trending down cont ESA - tfs 20% 4. Secondary hyperparathyroidism - P 2.2; 2 renvela ac on holdfor now - resume as needed; off hectorol - appears it was not continued when admitted in March; correct Ca now high 10 5. HTN/volume - vol excess/ BP's up, max UF w HD today 6. Nutrition - alb 2.3 - renal diet/prostat, vit 7. Fever - Afebrile; BC pending CXR neg on empiric Vanc and Zosyn - still with leukocytosis - but no fevers; urine - lactobacillus; feels ok   Kelly Splinter MD [place] pager [phone]    cell [phone] 12/18/2015, 12:45 PM    Subjective:   No c/o.   Objective Filed Vitals:   12/17/15 1128 12/17/15 1206 12/17/15 1340 12/18/15 0454  BP: 165/56 164/54 127/50 145/63  Pulse: 74 70 73 69  Temp: 99.5 F (37.5 C)  98.7 F (37.1 C) 97.8 F (36.6 C)  TempSrc: Oral  Oral Oral  Resp: 16  17 18   Weight:    53.7 kg (118 lb 6.2 oz)  SpO2: 100%  99% 100%   Physical Exam General: NAD bathing Heart:RRR 2/6 murmur Lungs: no rales Abdomen: soft NT Extremities: right BKA wearing prosthesis, left BKA wrapped Dialysis Access:  Left upper AVF + bruit  Dialysis Orders: [facility] MWF 4h 52kg preBKA 3K/2.25 bath P4 LUA AVF Hep 2500 Mircera: 50 mcg IV q 2 weeks (11/13/15) Hectoral: 4 mcg IV Q TTS (last dose 11/25/15)  Additional Objective Labs: Basic Metabolic Panel:  Recent Labs Lab 12/13/15 0531 12/13/15 1500 12/16/15 2104  NA 133* 130* 128*  K 3.5 3.8 3.4*  CL 91* 90* 89*  CO2 28 26 23   GLUCOSE 126* 191* 194*  BUN 47* 52* 42*  CREATININE 5.97* 6.26* 7.89*  CALCIUM 8.5* 8.6* 8.2*  PHOS  --  2.2* 6.0*   Liver Function Tests:  Recent Labs Lab 12/11/15 2359 12/13/15 1500 12/16/15 2104  AST 35  --   --   ALT 10*  --   --   ALKPHOS 90  --   --   BILITOT 0.4  --   --   PROT 6.4*  --   --   ALBUMIN  2.3* 2.3* 2.2*   CBC:  Recent Labs Lab 12/11/15 2359 12/12/15 0429 12/13/15 0531 12/13/15 1500 12/16/15 2103  WBC 18.6* 17.8* 15.0* 17.4* 9.4  NEUTROABS 16.4* 15.4* 10.8*  --   --   HGB 9.3* 9.0* 9.2* 8.7* 8.8*  HCT 28.0* 28.6* 28.0* 27.1* 27.4*  MCV 93.0 93.2 93.0 91.9 91.3  PLT 278 294 309 323 355   Blood Culture    Component Value Date/Time   SDES BLOOD RIGHT ANTECUBITAL 12/11/2015 1615   SPECREQUEST BOTTLES DRAWN AEROBIC AND ANAEROBIC 10CC 12/11/2015 1615   CULT NO GROWTH 5 DAYS 12/11/2015 1615   REPTSTATUS 12/16/2015 FINAL 12/11/2015 1615    CBG:  Recent Labs Lab 12/17/15 1130 12/17/15 1639 12/17/15 2050 12/18/15 0634 12/18/15 1210  GLUCAP 83 166* 138* 77 133*   Medications:   . amLODipine  2.5 mg Oral Daily  . darbepoetin (ARANESP) injection - DIALYSIS  200 mcg Intravenous Q Wed-HD  . famotidine  10 mg Oral Daily  . feeding supplement (NEPRO CARB STEADY)  237 mL Oral TID WC  . ferric gluconate (FERRLECIT/NULECIT) IV  125 mg Intravenous Q M,W,F-HD  . gabapentin  100  mg Oral BID  . glipiZIDE  2.5 mg Oral QAC breakfast  . heparin subcutaneous  5,000 Units Subcutaneous 3 times per day  . insulin aspart  0-10 Units Subcutaneous TID WC & HS  . multivitamin  1 tablet Oral QHS  . piperacillin-tazobactam (ZOSYN)  IV  2.25 g Intravenous Q8H  . polycarbophil  625 mg Oral BID AC  . saccharomyces boulardii  250 mg Oral BID  . senna  2 tablet Oral BID AC  . simvastatin  20 mg Oral q1800  . vancomycin  500 mg Intravenous Q M,W,F-HD

## 2015-12-18 NOTE — Progress Notes (Signed)
Cultures negative. Leucocytosis resolved and she has had 6+ days antibiotic treatment. Patient having GI distress --will d/c antibiotics.

## 2015-12-18 NOTE — Progress Notes (Signed)
Orthopedic Tech Progress Note Patient Details:  Carolyn Valenzuela Mar 23, 1951 [id number]  Patient ID: Carolyn Valenzuela, female   DOB: 06/16/1951, 65 y.o.   MRN: [id number] Called in advanced brace order; spoke with Evangeline Dakin, Lynesha Bango 12/18/2015, 10:23 AM

## 2015-12-18 NOTE — Progress Notes (Signed)
Occupational Therapy Session Note  Patient Details  Name: Carolyn Valenzuela MRN: [id number] Date of Birth: [birth year]  Today's Date: 12/18/2015 OT Individual Time: 0900-1000 OT Individual Time Calculation (min): 60 min    Skilled Therapeutic Interventions/Progress Updates:    1:1 self care retraining at sink level. Focus on squat pivot transfer with bottom clearance with supervision into w/c to come to sink for bathing and dressing. Pt can don prosthesis with setup.  Pt recommended to wear a thicker socks (wore a blue and a yellow sock) with prosthesis for better fit today- reported it is not fitting well and not feeling right. Pt able to perform sit to stand at sink with UE support and leaning on sink when need to use one UE with supervision. Pt donned clothing once setup sit to stand.  Pt donned skirt over head and then after pulling up underwear- pulled skirt down with supervision and extra time. Pt performed grooming at sink mod I   Therapy Documentation Precautions:  Precautions Precautions: Fall Precaution Comments: wound vac Required Braces or Orthoses: Other Brace/Splint Other Brace/Splint: has prosthetic for RLE Restrictions Weight Bearing Restrictions: Yes LLE Weight Bearing: Non weight bearing Other Position/Activity Restrictions: LLE NWB General:   Vital Signs:  Pain: ADL: ADL ADL Comments: see Functional Assessment Tool  See Function Navigator for Current Functional Status.   Therapy/Group: Individual Therapy  [name] 12/18/2015, 9:25 AM

## 2015-12-18 NOTE — Progress Notes (Signed)
Physical Therapy Session Note  Patient Details  Name: Carolyn Valenzuela MRN: [id number] Date of Birth: March 05, 1951  Today's Date: 12/18/2015 PT Individual Time: 1000-1030 PT Individual Time Calculation (min): 30 min   Short Term Goals: Week 2:  PT Short Term Goal 1 (Week 2): STG = LTG due to LOS  Skilled Therapeutic Interventions/Progress Updates:   Patient in wheelchair, propelled to and from gym with mod I in controlled environment. Performed 10 wheelchair pushups for pressure relief and BUE strengthening. Patient required max verbal/visual cues to doff B leg rests when setting up wheelchair for transfer. Performed squat pivot transfers wheelchair <> mat table with supervision. Instructed in LLE therex for strengthening/ROM: seated L hamstring stretch 2 x 60 sec, supine SLR x 20, bridging over bolster 2 x 10, sidelying hip flexion/extension x 20, and sidelying hip abduction x 20. Patient reported need for bathroom, returned to wheelchair and performed stand pivot transfer to elevated seat over toilet using grab bar with mod A and total A to manage clothing. Patient left on toilet with RN in room.   Therapy Documentation Precautions:  Precautions Precautions: Fall Precaution Comments: wound vac Required Braces or Orthoses: Other Brace/Splint Other Brace/Splint: has prosthetic for RLE Restrictions Weight Bearing Restrictions: Yes LLE Weight Bearing: Non weight bearing Other Position/Activity Restrictions: LLE NWB Pain: Pain Assessment Pain Assessment: No/denies pain  See Function Navigator for Current Functional Status.   Therapy/Group: Individual Therapy  Laretta Alstrom 12/18/2015, 10:35 AM

## 2015-12-18 NOTE — Progress Notes (Signed)
[facility] PHYSICAL MEDICINE & REHABILITATION     PROGRESS NOTE  Subjective/Complaints:  Patient lying in bed this morning. She has questions about her discharge. She is also worried about using the stump shrinker due to potential pain.  ROS:   Denies CP, SOB, N/V/D.  Objective: Vital Signs: Blood pressure 145/63, pulse 69, temperature 97.8 F (36.6 C), temperature source Oral, resp. rate 18, weight 53.7 kg (118 lb 6.2 oz), SpO2 100 %. No results found.  Recent Labs  12/16/15 2103  WBC 9.4  HGB 8.8*  HCT 27.4*  PLT 355    Recent Labs  12/16/15 2104  NA 128*  K 3.4*  CL 89*  GLUCOSE 194*  BUN 42*  CREATININE 7.89*  CALCIUM 8.2*   CBG (last 3)   Recent Labs  12/17/15 1639 12/17/15 2050 12/18/15 0634  GLUCAP 166* 138* 77    Wt Readings from Last 3 Encounters:  12/18/15 53.7 kg (118 lb 6.2 oz)  12/04/15 51 kg (112 lb 7 oz)  11/25/15 54.885 kg (121 lb)    Physical Exam:  BP 145/63 mmHg  Pulse 69  Temp(Src) 97.8 F (36.6 C) (Oral)  Resp 18  Wt 53.7 kg (118 lb 6.2 oz)  SpO2 100% Constitutional: She appears well-developed. NAD.  HENT:  Normocephalic and atraumatic.   Eyes: Conjunctivae and EOM are normal. + Murmur Cardiovascular: Normal rate and regular rhythm. Respiratory: Effort normal and breath sounds normal. No respiratory distress. She has no wheezes.  GI: Soft. Bowel sounds are normal. She exhibits no distension. There is no tenderness.  Musculoskeletal: She exhibits mild edema, no tenderness.  Bilateral BKA  Neurological: She is alert and oriented.  Motor: Bilateral upper extremities: 5/5 proximal to distal Right lower extremity: BKA, 5/5 hip flexion  Left lower extremity: Hip flexion 5/5  Skin: Skin is warm and dry.  left BKA with staples C/D/I.  R-BKA healed well. Psychiatric: She has a normal mood and affect. Her behavior is normal. Thought content normal.   Assessment/Plan: 1. Functional deficits secondary to to left BKA, with previous  right BKA which require 3+ hours per day of interdisciplinary therapy in a comprehensive inpatient rehab setting. Physiatrist is providing close team supervision and 24 hour management of active medical problems listed below. Physiatrist and rehab team continue to assess barriers to discharge/monitor patient progress toward functional and medical goals.  Function:  Bathing Bathing position   Position: Wheelchair/chair at sink (sit to stand )  Bathing parts Body parts bathed by patient: Right arm, Left arm, Chest, Abdomen, Front perineal area, Right upper leg, Left upper leg, Buttocks Body parts bathed by helper: Back, Buttocks (A to clean around wounds)  Bathing assist Assist Level: Set up   Set up : To adjust water temperature  Upper Body Dressing/Undressing Upper body dressing   What is the patient wearing?: Bra, Pull over shirt/dress Bra - Perfomed by patient: Thread/unthread right bra strap, Thread/unthread left bra strap, Hook/unhook bra (pull down sports bra) Bra - Perfomed by helper: Thread/unthread left bra strap Pull over shirt/dress - Perfomed by patient: Thread/unthread right sleeve, Thread/unthread left sleeve, Put head through opening, Pull shirt over trunk Pull over shirt/dress - Perfomed by helper: Pull shirt over trunk        Upper body assist Assist Level: Set up   Set up : To obtain clothing/put away  Lower Body Dressing/Undressing Lower body dressing Lower body dressing/undressing activity did not occur: Refused What is the patient wearing?: Underwear (prosthsis, skirt) Underwear -  Performed by patient: Thread/unthread right underwear leg, Thread/unthread left underwear leg, Pull underwear up/down Underwear - Performed by helper: Thread/unthread right underwear leg, Thread/unthread left underwear leg Pants- Performed by patient: Thread/unthread right pants leg, Thread/unthread left pants leg, Pull pants up/down (skirt) Pants- Performed by helper: Thread/unthread  right pants leg, Thread/unthread left pants leg                      Lower body assist Assist for lower body dressing: Supervision or verbal cues      Toileting Toileting Toileting activity did not occur: N/A (anuric) Toileting steps completed by patient: Adjust clothing prior to toileting, Adjust clothing after toileting Toileting steps completed by helper: Performs perineal hygiene Toileting Assistive Devices: Grab bar or rail  Toileting assist Assist level: Supervision or verbal cues   Transfers Chair/bed transfer   Chair/bed transfer method: Squat pivot Chair/bed transfer assist level: Set up only Chair/bed transfer assistive device: Armrests, Bedrails     Locomotion Ambulation Ambulation activity did not occur: Safety/medical concerns   Max distance: 10 ft Assist level: Touching or steadying assistance (Pt > 75%)   Wheelchair Wheelchair activity did not occur: N/A Type: Manual Max wheelchair distance: 150 ft Assist Level: Supervision or verbal cues  Cognition Comprehension Comprehension assist level: Follows basic conversation/direction with no assist  Expression Expression assist level: Expresses basic needs/ideas: With no assist  Social Interaction Social Interaction assist level: Interacts appropriately with others with medication or extra time (anti-anxiety, antidepressant).  Problem Solving Problem solving assist level: Solves basic 90% of the time/requires cueing < 10% of the time  Memory Memory assist level: Recognizes or recalls 90% of the time/requires cueing < 10% of the time    Medical Problem List and Plan: 1. Abnormality of gait, safety, deficits with transfers secondary to left BKA, with previous right BKA.  Continue CIR  Stump shrinker ordered for left BKA 2. DVT Prophylaxis/Anticoagulation: Pharmaceutical: Lovenox 3. Pain Management:Hydrocodone prn for pain, low dose robaxin to help with muscle spasms.  4. Mood: Very motivated to get better  and resume independent living. LCSW to follow for evaluation and support.  5. Neuropsych: This patient is capable of making decisions on her own behalf. 6. Skin/Wound Care: Routine pressure relief measures.   7. Fluids/Electrolytes/Nutrition: Labs being monitored ---hyponateremia/hyperkalemia being managed by HD, appreciate recs.  8. ESRD: On renal diet with 1200 FR. Hemodialysis after therapy sessions on MWF to help with tolerance of therapy.  9. DMT2: Monitor BS ac/hs.  Will remove diabetic retrictions to help with food choices/intake. Use SSI for elevated BS.   Home glipizide 5 mg on 4/8, will decrease to 2.5 on 4/13   Will continue to monitor. 10 HTN:   Labile at present, however, relatively elevated, low dose Norvasc started on 4/18   Will continue to monitor and increase as necessary 11. Anemia of chronic disease: Continue Aransep weekly.  12. Metabolic bone disease: On renvela.  13. Hypoxia: Improving. Encourage IS.  14. Dysphagia  Patient was scheduled to get workup prior to admission  Per SLP, no significant issues 15. Urosepsis  Leukocytosis (resolved), continued fevers/ low grade temps  Continue on empiric Vanc/Zosyn, Will DC tomorrow   Stump does not show signs of infection  Blood cultures NG  Chest x-ray from 4/12  Unremarkable  UCX with lactobacillus, less than 20,000  16. Urinary retention   patient had urinary retention this weekend, but this appears to have resolved and she is in anuric at present.  LOS (Days) 14 A FACE TO FACE EVALUATION WAS PERFORMED  Eliav Mechling Lorie Phenix 12/18/2015 10:25 AM

## 2015-12-18 NOTE — Progress Notes (Signed)
Social Work Patient ID: Carolyn Valenzuela Comment, female   DOB: 15-May-1951, 65 y.o.   MRN: [id number] Met with pt to discuss team conference and reaching her goals. She reports not feeling well today and being nauseous. She feels it is stress over going home Friday. Discussed roommate educated and having someone with her will help. She hopes her wound doesn't get worse and will try her best to do what the RN's say. Will have [place] in her home via [place] she has had before. Will try to get a drop-arm bedside commode for home. See how pt feels tomorrow.

## 2015-12-18 NOTE — Patient Care Conference (Signed)
Inpatient RehabilitationTeam Conference and Plan of Care Update Date: 12/18/2015   Time: 2:15 PM    Patient Name: Carolyn Valenzuela      Medical Record Number: [id number]  Date of Birth: [birth year] Sex: Female         Room/Bed: 4W22C/4W22C-01 Payor Info: Payor: [place] / Plan: [employer] PPO / Product Type: *No Product type* /    Admitting Diagnosis: L BKA  Admit Date/Time:  12/04/2015  6:02 PM Admission Comments: No comment available   Primary Diagnosis:  <principal problem not specified> Principal Problem: <principal problem not specified>  Patient Active Problem List   Diagnosis Date Noted  . Acute lower UTI   . Labile blood pressure   . Hyperglycemia   . SIRS (systemic inflammatory response syndrome) (HCC)   . Fever   . Hypoglycemia associated with diabetes ([place])   . Leukocytosis   . Urinary retention   . Dysphagia   . Unilateral complete BKA ([place]) 12/04/2015  . S/P bilateral BKA (below knee amputation) ([place])   . Abnormality of gait   . Muscle spasm   . ESRD on dialysis ([place])   . Type 2 diabetes mellitus with diabetic peripheral angiopathy and gangrene, without long-term current use of insulin ([place])   . Post-operative pain   . Benign essential HTN   . Metabolic bone disease   . Hypoxia   . Type 2 diabetes mellitus with peripheral neuropathy (HCC)   . Labile blood glucose   . End stage renal disease ([place])   . Postoperative pain   . Acute blood loss anemia   . Anemia of chronic disease   . Below knee amputation status ([place]) 11/29/2015  . Odynophagia 11/12/2015  . Chest pain, atypical 06/06/2015  . Chest pain 05/28/2015  . Ingrown nail 05/28/2015  . Acute pulmonary edema (HCC)   . FUO (fever of unknown origin)   . HCAP (healthcare-associated pneumonia)   . ESRD needing dialysis ([place])   . Type 2 diabetes mellitus with ESRD (end-stage renal disease) ([place]) 09/01/2014  . Anemia of renal disease 09/01/2014  . Venous insufficiency of left leg  08/27/2014  . Rash 07/06/2014  . S/P BKA (below knee amputation) ([place]) 03/20/2014  . 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 Critical lower limb ischemia 01/09/2014  . Right foot ulcer ([place]) 11/07/2013  . Toe pain 09/06/2013  . Pre-ulcerative corn or callous 09/06/2013  . Lower extremity pain 09/06/2013  . Left low back pain 12/08/2011  . Hip bursitis, left 12/08/2011  . Left leg pain 12/08/2011  . Abdominal pain, other specified site 07/08/2011  . Preventative health care 12/08/2010  . Allergic rhinitis, cause unspecified 12/08/2010  . SECONDARY HYPERPARATHYROIDISM 05/21/2009  . NUMBNESS 05/21/2009  . BACK PAIN 05/02/2009  . ANEMIA-IRON DEFICIENCY 01/25/2008  . Essential hypertension 01/25/2008  . CERVICAL RADICULOPATHY, LEFT 01/25/2008  . Hyperlipidemia 03/30/2007    Expected Discharge Date: Expected Discharge Date: 12/20/15  Team Members Present: Physician leading conference: Dr. Delice Lesch Social Worker Present: Ovidio Kin, LCSW Nurse Present: Elliot Cousin, RN PT Present: Other (comment) Barrie Folk & Myrtie Hawk) OT Present: [name], OT SLP Present: Windell Moulding, SLP PPS Coordinator present : Daiva Nakayama, RN, CRRN     Current Status/Progress Goal Weekly Team Focus  Medical   Abnormality of gait, safety, deficits with transfers secondary to left BKA, with previous right BKA  improve safety, mobility, d/c IV abx  see above   Bowel/Bladder  oliguric. cont of bowel LBM: 12/17/2015  remain cont x2   encourage bowel medications and assess for regular bowel movements    Swallow/Nutrition/ Hydration             ADL's   Supervision-steadying assist for transfers, Min A dressing, setup for bathing  mod I for toileting and transfers, bathing supervision, dressing mod I, supervision for standing balance  Supported standing balance for lower body dressing, tub bench transfer/bathing, Endurance and BUE strengthening, discharge planning    Mobility   supervision in w/c, supervision for stand or squat pivot transfers bed<>w/c, min A for ambulating short Distances (~25 ft)  supervision standing balance, Mod I bed mobility, supervision transfers, mod I w/c mobility, negotiate single step  gait training, increasing endurance, balance, patient & family education   Communication             Safety/Cognition/ Behavioral Observations            Pain   PRN norco (2 tablets) occasionally   <4   continue to monitor and treat pain q shift and PRN    Skin   L residual with staples. daily dressing change. no drainage noted. bruised area to top of the shin/lower knee. MASD to buttocks with missing skin   min assist wtih daily dressing chages. no further breakdown   educated patient on dressing change, and monitor sacrum for further breakdown      *See Care Plan and progress notes for long and short-term goals.  Barriers to Discharge: DM, HTN, wounds, IV abx    Possible Resolutions to Barriers:  WOC nurse, optimise DM meds, optimize HTN meds, workup leukocytosis    Discharge Planning/Teaching Needs:  Now niece's boyfriend's sister may be staying with pt according to her. Unsure if can get in for family education. Will do the best she can at discharge with sister's and friends. PCS applicaiton pended      Team Discussion:  Reaching goals of supervision level. Wound on sacrum being dressed daily. IV antibiotic just until DC. Roommate has been educated in therapies. Encouraged not to ambulate long distances at home, only to bathroom doorway  Revisions to Treatment Plan:  None   Continued Need for Acute Rehabilitation Level of Care: The patient requires daily medical management by a physician with specialized training in physical medicine and rehabilitation for the following conditions: Daily direction of a multidisciplinary physical rehabilitation program to ensure safe treatment while eliciting the highest outcome that is of practical  value to the patient.: Yes Daily medical management of patient stability for increased activity during participation in an intensive rehabilitation regime.: Yes Daily analysis of laboratory values and/or radiology reports with any subsequent need for medication adjustment of medical intervention for : Post surgical problems;Diabetes problems;Wound care problems;Blood pressure problems;Renal problems  Elease Hashimoto 12/18/2015, 3:52 PM

## 2015-12-18 NOTE — Progress Notes (Signed)
Physical Therapy Session Note  Patient Details  Name: Carolyn Valenzuela MRN: [id number] Date of Birth: 1951-05-05  Today's Date: 12/18/2015 PT Individual Time: 1102-1202 PT Individual Time Calculation (min): 60 min   Short Term Goals: Week 1:  PT Short Term Goal 1 (Week 1): bed mobility without cues or Assistance.  PT Short Term Goal 1 - Progress (Week 1): Met PT Short Term Goal 2 (Week 1): Bed chair transfer with Min A with sqaut pivot.  PT Short Term Goal 2 - Progress (Week 1): Progressing toward goal PT Short Term Goal 3 (Week 1): Sit to stand with RW with min A.  PT Short Term Goal 3 - Progress (Week 1): Progressing toward goal PT Short Term Goal 4 (Week 1): gait with RW for 62f with min A.  PT Short Term Goal 4 - Progress (Week 1): Met Week 2:  PT Short Term Goal 1 (Week 2): STG = LTG due to LOS  Skilled Therapeutic Interventions/Progress Updates:    WC 1574fx 2, 20050f 1 in controlled environment with increased time and effort, but no cues from PT. PT instructed patient in WC ramp [id number] and Descent with supervision A and cues for improved wheel control with descent. Patient performed gait training for 62ft35fft 40f7 ft with RW and supervision-min A from PT. Stand pivot trasfer x 6 throughout treatment, MinA-progressing to Supervision A and cues for proper UE placement and improved anterior weight shift. Squat pivot with supervision A x 2. Patient performed WC mobility back to room with supervision A due to increased stomach pain. Patient reports that she needs to vomit at end of session, once back in room. PT provided patient with wash pan if needed. PT visualized patient vomit x2 into mouth, then spit remaining residue into napkin. RN informed of emesis. Patient was left [facility] within reach.   Therapy Documentation Precautions:  Precautions Precautions: Fall Precaution Comments: wound vac Required Braces or Orthoses: Other Brace/Splint Other  Brace/Splint: has prosthetic for RLE Restrictions Weight Bearing Restrictions: Yes LLE Weight Bearing: Non weight bearing Other Position/Activity Restrictions: LLE NWB  See Function Navigator for Current Functional Status.   Therapy/Group: Individual Therapy  AustinLorie Phenix2017, 12:50 PM

## 2015-12-18 NOTE — Progress Notes (Signed)
Physical Therapy Session Note  Patient Details  Name: Carolyn Valenzuela MRN: [id number] Date of Birth: 03-22-1951  Today's Date: 12/18/2015 PT Individual Time: 6834 - 1702 PT Individual Time Calculation (min): 61 min   Short Term Goals: Week 1:  PT Short Term Goal 1 (Week 1): bed mobility without cues or Assistance.  PT Short Term Goal 1 - Progress (Week 1): Met PT Short Term Goal 2 (Week 1): Bed chair transfer with Min A with sqaut pivot.  PT Short Term Goal 2 - Progress (Week 1): Progressing toward goal PT Short Term Goal 3 (Week 1): Sit to stand with RW with min A.  PT Short Term Goal 3 - Progress (Week 1): Progressing toward goal PT Short Term Goal 4 (Week 1): gait with RW for 8f with min A.  PT Short Term Goal 4 - Progress (Week 1): Met  Skilled Therapeutic Interventions/Progress Updates:    Pt received in bed, denying c/o pain, & agreeable to PT. Pt already had R LE prosthetic leg donned & shrinker on LLE. Pt practiced doffing & donning shrinker with subtle/questioning cues from PT to ensure there are no wrinkles in shrinker, and to fold down second layer & pt able to do so. Pt transferred bed>w/c via squat pivot with supervision & required supervision to attached amputee support pad & leg rest to on chair. PT instructed pt to move w/c away from bed to allow her plenty of room to attach amputee support pad. Pt able to self propel w/c from room to central zone ramp with supervision fade mod I. Pt able to negotiate ramp with Mod I & only required 1 rest break during entire w/c propulsion activity. Pt returned to gym and required extra time & subtle cuing to set up w/c by mat table for squat pivot transfer. PT cued pt to remove leg rest prior to setting w/c up beside mat to ensure she had room to do so. Pt then able to set up w/c& complete squat pivot w/c>mat with supervision. While sitting on Edge of mat without BUE & BLE support pt played Wii bowling with activity focusing on sitting balance  & core strengthening; pt had no LOB during task. Pt then returned to w/c via squat pivot & self propelled w/c back to room. At end of session pt was left in w/c with all needs within reach.   Therapy Documentation Precautions:  Precautions Precautions: Fall Precaution Comments: wound vac Required Braces or Orthoses: Other Brace/Splint Other Brace/Splint: has prosthetic for RLE Restrictions Weight Bearing Restrictions: Yes LLE Weight Bearing: Non weight bearing Other Position/Activity Restrictions: LLE NWB  Pain: Pain Assessment Pain Assessment: No/denies pain Pain Score: 0-No pain   See Function Navigator for Current Functional Status.   Therapy/Group: Individual Therapy  VWaunita Schooner4/19/2017, 4:15 PM

## 2015-12-19 ENCOUNTER — Inpatient Hospital Stay (HOSPITAL_COMMUNITY): Payer: BC Managed Care – PPO | Admitting: Physical Therapy

## 2015-12-19 ENCOUNTER — Inpatient Hospital Stay (HOSPITAL_COMMUNITY): Payer: BC Managed Care – PPO

## 2015-12-19 LAB — GLUCOSE, CAPILLARY
Glucose-Capillary: 179 mg/dL — ABNORMAL HIGH (ref 65–99)
Glucose-Capillary: 143 mg/dL — ABNORMAL HIGH (ref 65–99)
Glucose-Capillary: 169 mg/dL — ABNORMAL HIGH (ref 65–99)
Glucose-Capillary: 166 mg/dL — ABNORMAL HIGH (ref 65–99)

## 2015-12-19 MED ORDER — AMLODIPINE BESYLATE 2.5 MG PO TABS
2.5000 mg | ORAL_TABLET | Freq: Once | ORAL | Status: AC
  Administered 2015-12-19: 2.5 mg via ORAL
  Filled 2015-12-19: qty 1

## 2015-12-19 MED ORDER — AMLODIPINE BESYLATE 5 MG PO TABS: 5.0000 mg | ORAL_TABLET | Freq: Every day | ORAL | Status: DC

## 2015-12-19 NOTE — Progress Notes (Signed)
Occupational Therapy Session Note  Patient Details  Name: Carolyn Valenzuela MRN: [id number] Date of Birth: Sep 15, 1950  Today's Date: 12/19/2015 OT Individual Time: [id number] OT Individual Time Calculation (min): 60 min   Short Term Goals: Week 2:  OT Short Term Goal 1 (Week 2): Pt will complete 3 of 3 toileting tasks with only supervision assist. OT Short Term Goal 2 (Week 2): Pt will demo ability to retrieve items from kitchen at w/c level independently in prep for simple meal. OT Short Term Goal 3 (Week 2): Pt will don/doff prosthesis sitting at EOB independently OT Short Term Goal 4 (Week 2): Pt will verbalize 4 compensatory strategies to simplify performance of BADL/iADL s/p discharge (ie wear gowns vs pants, use convenience meals, delegate iADL, etc.) OT Short Term Goal 5 (Week 2): Pt will complete upper body HEP using written instructions with supervision for technique  Skilled Therapeutic Interventions/Progress Updates: ADL-retraining at shower level (tub/shower combination) with focus on improved mobility using RW, dynamics standing balance, safety awareness, and prosthetics management.   Pt received supine in bed but alert and requesting return to shower vs sink.   Pt donned prosthesis with min assist to insure lock engaged d/t addition of another sock over limb.   Pt completed transfer from EOB to w/c with supervision and was escorted to ADL apartment.   Pt able to hop for doorway of bathroom to bench and remove all clothing unassisted.    Pt bathed unassisted but required min vc to problem-solve for technique back to w/c after she donned her prosthesis again.   Pt returned to her room in her gown and deferred dressing until second OT session.     Therapy Documentation Precautions:  Precautions Precautions: Fall Precaution Comments: wound vac Required Braces or Orthoses: Other Brace/Splint Other Brace/Splint: has prosthetic for RLE Restrictions Weight Bearing Restrictions: Yes LLE  Weight Bearing: Non weight bearing Other Position/Activity Restrictions: LLE NWB   Pain: Pain Assessment Pain Assessment: 0-10 Pain Score: 3  Pain Type: Surgical pain Pain Location: Leg Pain Orientation: Left Pain Descriptors / Indicators: Aching Pain Frequency: Intermittent Pain Onset: Gradual Patients Stated Pain Goal: 2 Pain Intervention(s): Medication (See eMAR);Repositioned Multiple Pain Sites: No   ADL: ADL ADL Comments: see Functional Assessment Tool   See Function Navigator for Current Functional Status.   Therapy/Group: Individual Therapy   Second session: Time: 1115-1150 Time Calculation (min):  35 min  Pain Assessment: No/denies pain  Skilled Therapeutic Interventions: ADL-retraining with focus on discharge planning, dynamic standing balance, adapted dressing skills.   Pt received seated in w/c collecting her clothing from dresser with RN nearby.   Pt aware of need for EPC cream to buttocks and self-propelled w/c to sink to stand supported as therapist applied lotion.   Pt able to manage her under independently while standing supported.   Pt request pants placed on bed but reconsidered after reminder to maintain easy on/off clothing d/t episode of diarrhea.   Pt removes prosthesis unassisted and dons skirt with cue to lace right leg first after pt unsuccessfully attempted left leg then right leg.   Pt continues to require cues to problem solve but is able to manage standing tasks, sit<>stand, and mobility using RW with only standby assist during session.   Therapy ended 10 min early d/t OT called away from treatment.  General: General OT Amount of Missed Time: 10 Minutes   See FIM for current functional status  Therapy/Group: Individual Therapy  [place] 12/19/2015, 2:00 PM

## 2015-12-19 NOTE — Progress Notes (Signed)
Social Work Patient ID: Carolyn Valenzuela Comment, female   DOB: Feb 11, 1951, 65 y.o.   MRN: [id number] Pt feeling better today and preparing to go home tomorrow. She feels once she gets into a routine she will be less anxious and do well. Sh states: " I can do my best, that's all."  Will see in am after HD.

## 2015-12-19 NOTE — Progress Notes (Signed)
Physical Therapy Discharge Summary  Patient Details  Name: Carolyn Valenzuela MRN: [id number] Date of Birth: Mar 10, 1951  Today's Date: 12/19/2015 PT Individual Time: 1300-1430 PT Individual Time Calculation (min): 90 min    Patient has met 9 of 9 long term goals due to improved activity tolerance, improved balance, improved postural control, increased strength, increased range of motion, decreased pain and ability to compensate for deficits.  Patient to discharge at a wheelchair level Modified Independent.   Patient's care partner requires assistance to provide the necessary physical assistance at discharge.    Recommendation:  Patient will benefit from ongoing skilled PT services in home health setting to continue to advance safe functional mobility, address ongoing impairments in Strength, balance, gait, functional mobility, and minimize fall risk.  Equipment: No equipment provided  Reasons for discharge: treatment goals met and discharge from hospital  Patient/family agrees with progress made and goals achieved: Yes    PT treatment:  Patient performed Grad day activities, see below for details. PT instructed patient in Roll L and R as well as sit<>supine without bed rail and increased time and effort, but no cues needed Gait training for 30 ft and 77f on level surface with Supervision A from PT with cues for turn technique. Gait for 140fon uneven surface with mod A from for weight shift and AD management. WC in controlled environment x [id number] with cues, but increased time noted. WC mobility on uneven surface including cement and paved sidewalk x 200 and 150. WC mobility on ramp to parking deck with out cues from PT, but increased time and effort required. Patient instructed patient in ascent and descent for 1 Stair with BUE support on rails with mod A. Car transfer x 2 with squat pivot, supervision required by PT with moderate cues for proper transfer to seat. PT  instructed patient in picking object off floor with min A x 2. Standing balance with reach forward to grab card off velcro board x 12, supervision A from PT throughout treatment patient performed sit<>stand with RW and supervision A from PT with constant cues for improved forward weight shift and UE placement. Patient left in room with call bell within reach.    PT Discharge Precautions/Restrictions Restrictions Weight Bearing Restrictions: Yes LLE Weight Bearing: Non weight bearing Vital Signs  Pain Pain Assessment Pain Assessment: 0-10 Pain Score: 3  Pain Type: Surgical pain Pain Location: Leg Pain Orientation: Left Pain Descriptors / Indicators: Aching Patients Stated Pain Goal: 2 Vision/Perception     Cognition Overall Cognitive Status: Within Functional Limits for tasks assessed Orientation Level: Oriented X4 Selective Attention: Appears intact Memory: Appears intact Awareness: Appears intact Problem Solving: Appears intact Safety/Judgment: Appears intact Comments: Mild cues for safety with gait and transfer.  Sensation Sensation Light Touch: Appears Intact Stereognosis: Appears Intact Hot/Cold: Appears Intact Proprioception: Appears Intact Coordination Gross Motor Movements are Fluid and Coordinated: Yes Fine Motor Movements are Fluid and Coordinated: Yes Motor  Motor Motor: Within Functional Limits  Mobility Bed Mobility Bed Mobility: Rolling Right;Rolling Left;Supine to Sit;Sit to Supine Rolling Right: 7: Independent Rolling Left: 7: Independent Supine to Sit: 6: Modified independent (Device/Increase time) Sit to Supine: 6: Modified independent (Device/Increase time) Transfers Transfers: Yes Sit to Stand: 5: Supervision Sit to Stand Details: Verbal cues for technique Stand to Sit: 5: Supervision Stand to Sit Details (indicate cue type and reason): Verbal cues for precautions/safety Stand Pivot Transfers: 5: Supervision Stand Pivot Transfer Details:  Verbal cues for safe use  of DME/AE;Verbal cues for precautions/safety Squat Pivot Transfers: 5: Supervision Squat Pivot Transfer Details: Verbal cues for technique;Verbal cues for precautions/safety;Verbal cues for sequencing Locomotion     Trunk/Postural Assessment  Cervical Assessment Cervical Assessment: Within Functional Limits Thoracic Assessment Thoracic Assessment: Within Functional Limits Lumbar Assessment Lumbar Assessment: Within Functional Limits Postural Control Postural Control: Within Functional Limits  Balance Balance Balance Assessed: Yes Static Sitting Balance Static Sitting - Comment/# of Minutes: WFL Dynamic Sitting Balance Sitting balance - Comments: Able to maintain balance without UE or LE with functional movement.  Static Standing Balance Static Standing - Comment/# of Minutes: Mod I with BUE or 1 UE Support.  Dynamic Standing Balance Dynamic Standing - Comments: Supervision A with functional reaching tasks with 1 UE supported on RW.  Extremity Assessment      RLE Assessment RLE Assessment: Within Functional Limits (4+/5 for hip and Knee) LLE Assessment LLE Assessment: Within Functional Limits (4+/5 for hip and knee)   See Function Navigator for Current Functional Status.  Lorie Phenix 12/19/2015, 3:09 PM

## 2015-12-19 NOTE — Progress Notes (Signed)
Social Work  Discharge Note  The overall goal for the admission was met for:   Discharge location: [place]  Length of Stay: Yes-16 DAYS  Discharge activity level: Yes-SUPERVISION WHEELCHAIR LEVEL  Home/community participation: Yes  Services provided included: MD, RD, PT, OT, SLP, RN, CM, TR, Pharmacy, Neuropsych and SW  Financial Services: Private Insurance: [place]  Follow-up services arranged: Home Health: [facility]-PT,OT,RN, DME: [facility] BEDSIDE COM[facility] and Patient/Family request agency HH: HAS AHD THEM BEFORE, DME: USED THEM BEFORE  Comments (or additional information):ROOMMATE WAS HERE AND ATTENDED THERAPIES WITH PT. PT'S SISTERS ALSO HERE AND WILL BE CHECKING ON HER [employer]SPORT TO PROVIDE HD TRANSPORTATION. PCS REFERRAL MADE SHOULD BE CONTACTING PT AT HOME. AWARE RECOMMENDATION IS 24 HR SUPERVISION   Patient/Family verbalized understanding of follow-up arrangements: Yes  Individual responsible for coordination of the follow-up plan: SELF & SANDRA-SISTER  Confirmed correct DME delivered: Elease Hashimoto 12/19/2015    Elease Hashimoto

## 2015-12-19 NOTE — Progress Notes (Signed)
[facility]IC[facility] & REHABILITATION     PROGRESS NOTE  Subjective/Complaints:  Patient lying in bed this morning. She is jovial. She notes that she did receive her stump shrinker.  ROS:   Denies CP, SOB, N/V/D.  Objective: Vital Signs: Blood pressure 142/48, pulse 77, temperature 99 F (37.2 C), temperature source Oral, resp. rate 16, weight 48.9 kg (107 lb 12.9 oz), SpO2 98 %. No results found.  Recent Labs  12/16/15 2103 12/18/15 1801  WBC 9.4 9.0  HGB 8.8* 9.0*  HCT 27.4* 28.4*  PLT 355 380    Recent Labs  12/16/15 2104 12/18/15 1802  NA 128* 133*  K 3.4* 3.5  CL 89* 92*  GLUCOSE 194* 173*  BUN 42* 21*  CREATININE 7.89* 6.02*  CALCIUM 8.2* 8.0*   CBG (last 3)   Recent Labs  12/18/15 1210 12/18/15 2207 12/19/15 0639  GLUCAP 133* 122* 179*    Wt Readings from Last 3 Encounters:  12/19/15 48.9 kg (107 lb 12.9 oz)  12/04/15 51 kg (112 lb 7 oz)  11/25/15 54.885 kg (121 lb)    Physical Exam:  BP 142/48 mmHg  Pulse 77  Temp(Src) 99 F (37.2 C) (Oral)  Resp 16  Wt 48.9 kg (107 lb 12.9 oz)  SpO2 98% Constitutional: She appears well-developed. NAD.  HENT:  Normocephalic and atraumatic.   Eyes: Conjunctivae and EOM are normal. + Murmur Cardiovascular: Normal rate and regular rhythm. Respiratory: Effort normal and breath sounds normal. No respiratory distress. She has no wheezes.  GI: Soft. Bowel sounds are normal. She exhibits no distension. There is no tenderness.  Musculoskeletal: She exhibits mild edema, no tenderness.  Bilateral BKA  Neurological: She is alert and oriented.  Motor: Bilateral upper extremities: 5/5 proximal to distal Right lower extremity: BKA, 5/5 hip flexion  Left lower extremity: Hip flexion 5/5  Skin: Skin is warm and dry.  left BKA with staples C/D/I, dusky appearance of skin of medial distal stump +? DTI left proximal tibia.  R-BKA healed well. Psychiatric: She has a normal mood and affect. Her behavior is  normal. Thought content normal.   Assessment/Plan: 1. Functional deficits secondary to to left BKA, with previous right BKA which require 3+ hours per day of interdisciplinary therapy in a comprehensive inpatient rehab setting. Physiatrist is providing close team supervision and 24 hour management of active medical problems listed below. Physiatrist and rehab team continue to assess barriers to discharge/monitor patient progress toward functional and medical goals.  Function:  Bathing Bathing position   Position: Wheelchair/chair at sink (sit to stand )  Bathing parts Body parts bathed by patient: Right arm, Left arm, Chest, Abdomen, Front perineal area, Right upper leg, Left upper leg, Buttocks Body parts bathed by helper: Back, Buttocks (A to clean around wounds)  Bathing assist Assist Level: Set up   Set up : To adjust water temperature  Upper Body Dressing/Undressing Upper body dressing   What is the patient wearing?: Bra, Pull over shirt/dress Bra - Perfomed by patient: Thread/unthread right bra strap, Thread/unthread left bra strap, Hook/unhook bra (pull down sports bra) Bra - Perfomed by helper: Thread/unthread left bra strap Pull over shirt/dress - Perfomed by patient: Thread/unthread right sleeve, Thread/unthread left sleeve, Put head through opening, Pull shirt over trunk Pull over shirt/dress - Perfomed by helper: Pull shirt over trunk        Upper body assist Assist Level: Set up   Set up : To obtain clothing/put away  Lower Body Dressing/Undressing  Lower body dressing Lower body dressing/undressing activity did not occur: Refused What is the patient wearing?: Underwear (prosthsis, skirt) Underwear - Performed by patient: Thread/unthread right underwear leg, Thread/unthread left underwear leg, Pull underwear up/down Underwear - Performed by helper: Thread/unthread right underwear leg, Thread/unthread left underwear leg Pants- Performed by patient: Thread/unthread right  pants leg, Thread/unthread left pants leg, Pull pants up/down (skirt) Pants- Performed by helper: Thread/unthread right pants leg, Thread/unthread left pants leg                      Lower body assist Assist for lower body dressing: Supervision or verbal cues      Toileting Toileting Toileting activity did not occur: N/A (anuric) Toileting steps completed by patient: Adjust clothing prior to toileting, Adjust clothing after toileting Toileting steps completed by helper: Performs perineal hygiene, Adjust clothing prior to toileting, Adjust clothing after toileting Toileting Assistive Devices: Grab bar or rail  Toileting assist Assist level: Supervision or verbal cues   Transfers Chair/bed transfer   Chair/bed transfer method: Squat pivot Chair/bed transfer assist level: Set up only Chair/bed transfer assistive device: Armrests, Bedrails     Locomotion Ambulation Ambulation activity did not occur: Safety/medical concerns   Max distance: 80ft Assist level: Supervision or verbal cues   Wheelchair Wheelchair activity did not occur: N/A Type: Manual Max wheelchair distance: 300 ft Assist Level: No help, No cues, assistive device, takes more than reasonable amount of time, Supervision or verbal cues  Cognition Comprehension Comprehension assist level: Follows basic conversation/direction with no assist  Expression Expression assist level: Expresses basic needs/ideas: With no assist  Social Interaction Social Interaction assist level: Interacts appropriately with others with medication or extra time (anti-anxiety, antidepressant).  Problem Solving Problem solving assist level: Solves basic 90% of the time/requires cueing < 10% of the time  Memory Memory assist level: Recognizes or recalls 90% of the time/requires cueing < 10% of the time    Medical Problem List and Plan: 1. Abnormality of gait, safety, deficits with transfers secondary to left BKA, with previous right  BKA.  Continue CIR  Stump shrinker 2. DVT Prophylaxis/Anticoagulation: Pharmaceutical: Lovenox 3. Pain Management:Hydrocodone prn for pain, low dose robaxin to help with muscle spasms.  4. Mood: Very motivated to get better and resume independent living. LCSW to follow for evaluation and support.  5. Neuropsych: This patient is capable of making decisions on her own behalf. 6. Skin/Wound Care: Routine pressure relief measures.   7. Fluids/Electrolytes/Nutrition: Labs being monitored ---hyponateremia/hyperkalemia being managed by HD, appreciate recs.  8. ESRD: On renal diet with 1200 FR. Hemodialysis after therapy sessions on MWF to help with tolerance of therapy.  9. DMT2: Monitor BS ac/hs.  Will remove diabetic retrictions to help with food choices/intake. Use SSI for elevated BS.   Home glipizide 5 mg on 4/8, decreased to 2.5 on 4/13   Will continue to monitor. 10 HTN:   Labile at present, however, relatively elevated, low dose Norvasc started on 4/18, will increase to 5 mg on 4/20  Will continue to monitor and increase as necessary 11. Anemia of chronic disease: Continue Aransep weekly.  12. Metabolic bone disease: On renvela.  13. Hypoxia: Improving. Encourage IS.  14. Dysphagia  Patient was scheduled to get workup prior to admission  Per SLP, no significant issues 15. Urosepsis  Leukocytosis (resolved), continued fevers/ low grade temps  Empiric Vanc/Zosyn DC'd on 4/19   Stump does not show signs of infection  Blood cultures NG  Chest x-ray from 4/12  Unremarkable  UCX with lactobacillus, less than 20,000  16. Urinary retention   patient had urinary retention this weekend, but this appears to have resolved and she is in anuric at present.   LOS (Days) 15 A FACE TO FACE EVALUATION WAS PERFORMED  Kaye Mitro Lorie Phenix 12/19/2015 10:33 AM

## 2015-12-20 DIAGNOSIS — A419 Sepsis, unspecified organism: Secondary | ICD-10-CM | POA: Diagnosis present

## 2015-12-20 LAB — RENAL FUNCTION PANEL
Albumin: 2.4 g/dL — ABNORMAL LOW (ref 3.5–5.0)
Anion gap: 12 (ref 5–15)
BUN: 21 mg/dL — ABNORMAL HIGH (ref 6–20)
CO2: 24 mmol/L (ref 22–32)
Calcium: 8.7 mg/dL — ABNORMAL LOW (ref 8.9–10.3)
Chloride: 98 mmol/L — ABNORMAL LOW (ref 101–111)
Creatinine, Ser: 5.84 mg/dL — ABNORMAL HIGH (ref 0.44–1.00)
GFR calc Af Amer: 8 mL/min — ABNORMAL LOW (ref >60)
GFR calc non Af Amer: 7 mL/min — ABNORMAL LOW (ref >60)
Glucose, Bld: 115 mg/dL — ABNORMAL HIGH (ref 65–99)
Phosphorus: 5.1 mg/dL — ABNORMAL HIGH (ref 2.5–4.6)
Potassium: 3.6 mmol/L (ref 3.5–5.1)
Sodium: 134 mmol/L — ABNORMAL LOW (ref 135–145)

## 2015-12-20 LAB — CBC
HCT: 31.1 % — ABNORMAL LOW (ref 36.0–46.0)
Hemoglobin: 9.8 g/dL — ABNORMAL LOW (ref 12.0–15.0)
MCH: 29.6 pg (ref 26.0–34.0)
MCHC: 31.5 g/dL (ref 30.0–36.0)
MCV: 94 fL (ref 78.0–100.0)
Platelets: 390 10*3/uL (ref 150–400)
RBC: 3.31 MIL/uL — ABNORMAL LOW (ref 3.87–5.11)
RDW: 16.7 % — ABNORMAL HIGH (ref 11.5–15.5)
WBC: 8.4 10*3/uL (ref 4.0–10.5)

## 2015-12-20 LAB — GLUCOSE, CAPILLARY
Glucose-Capillary: 90 mg/dL (ref 65–99)
Glucose-Capillary: 119 mg/dL — ABNORMAL HIGH (ref 65–99)

## 2015-12-20 MED ORDER — PENTAFLUOROPROP-TETRAFLUOROETH EX AERO: 1.0000 "application " | INHALATION_SPRAY | CUTANEOUS | Status: DC | PRN

## 2015-12-20 MED ORDER — AMLODIPINE BESYLATE 5 MG PO TABS: 5.0000 mg | ORAL_TABLET | Freq: Every day | ORAL | Status: DC

## 2015-12-20 MED ORDER — TRAZODONE HCL 50 MG PO TABS: 25.0000 mg | ORAL_TABLET | Freq: Every evening | ORAL | Status: DC | PRN

## 2015-12-20 MED ORDER — ALTEPLASE 2 MG IJ SOLR: 2.0000 mg | Freq: Once | INTRAMUSCULAR | Status: DC | PRN

## 2015-12-20 MED ORDER — SENNA 8.6 MG PO TABS: 2.0000 | ORAL_TABLET | Freq: Two times a day (BID) | ORAL | Status: DC

## 2015-12-20 MED ORDER — HEPARIN SODIUM (PORCINE) 1000 UNIT/ML DIALYSIS
1000.0000 [IU] | INTRAMUSCULAR | Status: DC | PRN
  Filled 2015-12-20: qty 1

## 2015-12-20 MED ORDER — LIDOCAINE-PRILOCAINE 2.5-2.5 % EX CREA
1.0000 "application " | TOPICAL_CREAM | CUTANEOUS | Status: DC | PRN
  Filled 2015-12-20: qty 5

## 2015-12-20 MED ORDER — NEPRO/CARBSTEADY PO LIQD: 237.0000 mL | Freq: Three times a day (TID) | ORAL | Status: DC

## 2015-12-20 MED ORDER — FAMOTIDINE 10 MG PO TABS: 10.0000 mg | ORAL_TABLET | Freq: Every day | ORAL | Status: DC

## 2015-12-20 MED ORDER — SODIUM CHLORIDE 0.9 % IV SOLN: 100.0000 mL | INTRAVENOUS | Status: DC | PRN

## 2015-12-20 MED ORDER — LIDOCAINE HCL (PF) 1 % IJ SOLN
5.0000 mL | INTRAMUSCULAR | Status: DC | PRN
  Filled 2015-12-20: qty 5

## 2015-12-20 MED ORDER — GLIPIZIDE 5 MG PO TABS: 2.5000 mg | ORAL_TABLET | Freq: Every day | ORAL | Status: DC

## 2015-12-20 MED ORDER — HEPARIN SODIUM (PORCINE) 1000 UNIT/ML DIALYSIS: 2500.0000 [IU] | Freq: Once | INTRAMUSCULAR | Status: DC

## 2015-12-20 NOTE — Discharge Instructions (Signed)
Inpatient Rehab Discharge Instructions  Carolyn Valenzuela Discharge date and time:  12/20/15  Activities/Precautions/ Functional Status: Activity: no lifting, driving, or strenuous exercise for till cleared by MD. Diet: renal diet 1200 FR Wound Care: keep wound clean and dry   Functional status:  ___ No restrictions     ___ Walk up steps independently ___ 24/7 supervision/assistance   ___ Walk up steps with assistance _X__ Intermittent supervision/assistance  ___ Bathe/dress independently ___ Walk with walker     _x__ Bathe/dress with assistance ___ Walk Independently    ___ Shower independently ___ Walk with assistance    ___ Shower with assistance _X__ No alcohol     ___ Return to work/school ________   Special Instructions: 1. Check blood sugars twice a day.  2. Eat a small snack if BS is less than 140 at bedtime.    COMMUNITY REFERRALS UPON DISCHARGE:    Home Health:   PT,OT,RN, [place]   Date of last service:12/20/2015  Medical Equipment/Items Ordered:L-[employer] SUPPORT PAD FOR HER OWN [place]    [phone] Other:PCS SERVICES WILL CONTACT HER AT HOME TO GET STARTED  GENERAL COMMUNITY RESOURCES FOR PATIENT/FAMILY: Support Groups:[employer] SUPPORT GROUP EVERY SECOND TUESDAY OF THE MONTH @ [facility] FROM 7:00-8:30 PM  My questions have been answered and I understand these instructions. I will adhere to these goals and the provided educational materials after my discharge from the hospital.  Patient/Caregiver Signature _______________________________ Date __________  Clinician Signature _______________________________________ Date __________  Please bring this form and your medication list with you to all your follow-up doctor's appointments.

## 2015-12-20 NOTE — Progress Notes (Signed)
[facility] Progress Note  Assessment/Plan: 1. s/p left BKA - per rehab/[facility] 2. ESRD - MWF HD. HD today 3. Anemia - hgb 9.8  trending up cont ESA - tfs 20% 4. Secondary hyperparathyroidism - P 2.4; 2 renvela ac on holdfor now - resume as needed; off hectorol - appears it was not continued when admitted in March; correct Ca now high 10 5. HTN/volume - BP up initially dropped when goal increased so goal reduced to 2.5 on HD tday - was able to UF 3.5 on Wed - to 48.9  6. Nutrition - alb 2.4 - renal diet/prostat, vit 7. Fever - Afebrile; BC pending CXR neg on empiric Vanc and Zosyn - still with leukocytosis - but no fevers; urine - lactobacillus; feels ok 8. Disp - d/c today  Myriam Jacobson, PA-C [place] [phone] 12/20/2015,8:41 AM  LOS: 16 days   Pt seen, examined and agree w A/P as above.  Kelly Splinter MD [facility] pager [phone]    cell [phone] 12/20/2015, 2:10 PM    Subjective:   Going home!  Objective Filed Vitals:   12/20/15 0752 12/20/15 0756 12/20/15 0800 12/20/15 0830  BP: 174/62 175/55 175/64 174/54  Pulse: 75 76 76 77  Temp: 98.8 F (37.1 C)     TempSrc: Oral     Resp: 18     Weight: 51.4 kg (113 lb 5.1 oz)     SpO2: 100%      Physical Exam General: NAD onHD Heart: RRR 2/6 murmur Lungs: no overt rales Abdomen: soft NT Extremities: no LE edema bilat BKAs Dialysis Access: left upper AVF   Dialysis Orders: [facility] MWF 4h 52kg preBKA 3K/2.25 bath P4 LUA AVF Hep 2500 Mircera: 50 mcg IV q 2 weeks (11/13/15) Hectoral: 4 mcg IV Q TTS (last dose 11/25/15)  Additional Objective Labs: Basic Metabolic Panel:  Recent Labs Lab 12/16/15 2104 12/18/15 1802 12/20/15 0804  NA 128* 133* 134*  K 3.4* 3.5 3.6  CL 89* 92* 98*  CO2 23 27 24   GLUCOSE 194* 173* 115*  BUN 42* 21* 21*  CREATININE 7.89* 6.02* 5.84*  CALCIUM 8.2* 8.0* 8.7*  PHOS 6.0* 6.2* 5.1*   Liver Function Tests:  Recent  Labs Lab 12/16/15 2104 12/18/15 1802 12/20/15 0804  ALBUMIN 2.2* 2.3* 2.4*   CBC:  Recent Labs Lab 12/13/15 1500 12/16/15 2103 12/18/15 1801 12/20/15 0804  WBC 17.4* 9.4 9.0 8.4  HGB 8.7* 8.8* 9.0* 9.8*  HCT 27.1* 27.4* 28.4* 31.1*  MCV 91.9 91.3 91.6 94.0  PLT 323 355 380 390   Blood Culture    Component Value Date/Time   SDES BLOOD RIGHT ANTECUBITAL 12/11/2015 1615   SPECREQUEST BOTTLES DRAWN AEROBIC AND ANAEROBIC 10CC 12/11/2015 1615   CULT NO GROWTH 5 DAYS 12/11/2015 1615   REPTSTATUS 12/16/2015 FINAL 12/11/2015 1615    CBG:  Recent Labs Lab 12/19/15 0639 12/19/15 1203 12/19/15 1646 12/19/15 2047 12/20/15 0640  GLUCAP 179* 143* 169* 166* 90   Medications:   . amLODipine  5 mg Oral Daily  . darbepoetin (ARANESP) injection - DIALYSIS  200 mcg Intravenous Q Wed-HD  . famotidine  10 mg Oral Daily  . feeding supplement (NEPRO CARB STEADY)  237 mL Oral TID WC  . ferric gluconate (FERRLECIT/NULECIT) IV  125 mg Intravenous Q M,W,F-HD  . gabapentin  100 mg Oral BID  . glipiZIDE  2.5 mg Oral QAC breakfast  . [START ON 12/21/2015] heparin  2,500 Units Dialysis Once in  dialysis  . heparin subcutaneous  5,000 Units Subcutaneous 3 times per day  . insulin aspart  0-10 Units Subcutaneous TID WC & HS  . multivitamin  1 tablet Oral QHS  . polycarbophil  625 mg Oral BID AC  . saccharomyces boulardii  250 mg Oral BID  . senna  2 tablet Oral BID AC  . simvastatin  20 mg Oral q1800

## 2015-12-20 NOTE — Progress Notes (Signed)
Pt. Got d/c instructions and follow up appointments.Pt. Is ready to go home with her family.

## 2015-12-20 NOTE — Discharge Summary (Signed)
Physician Discharge Summary  Patient ID: Carolyn Valenzuela MRN: [id number] DOB/AGE: [birth year] 65 y.o.  Admit date: 12/04/2015 Discharge date: 12/20/2015  Discharge Diagnoses:  Principal Problem:   Unilateral complete BKA ([place]) Active Problems:   Abnormality of gait   ESRD on dialysis ([place])   Type 2 diabetes mellitus with diabetic peripheral angiopathy and gangrene, without long-term current use of insulin ([facility])   Post-operative pain   Benign essential HTN   Metabolic bone disease   Leukocytosis   Urinary retention   Hypoglycemia associated with diabetes ([place])   Acute lower UTI   Sepsis ([place]) due to UTI   Discharged Condition: Stable.    Significant Diagnostic Studies: Dg Chest 2 View  12/11/2015  CLINICAL DATA:  Fever for 2 days. EXAM: CHEST  2 VIEW COMPARISON:  12/03/2015 FINDINGS: Mild linear scarring noted at the left lung base, stable. Lungs are otherwise clear. No pleural effusion or pneumothorax. Heart is mildly enlarged. No mediastinal or hilar masses or evidence of adenopathy. Bony thorax is intact. No change from prior study. IMPRESSION: No active cardiopulmonary disease. Electronically Signed   By: Lajean Manes M.D.   On: 12/11/2015 13:45   Dg Chest 2 View  11/29/2015  CLINICAL DATA:  Preop, gangrene of foot EXAM: CHEST  2 VIEW COMPARISON:  09/11/2014 FINDINGS: Cardiomediastinal silhouette is unremarkable. No acute infiltrate or pleural effusion. No pulmonary edema. Mild degenerative changes thoracic spine. Atherosclerotic calcifications of abdominal aorta. IMPRESSION: No active cardiopulmonary disease. Electronically Signed   By: Lahoma Crocker M.D.   On: 11/29/2015 13:20   Ct Head Wo Contrast  12/02/2015  CLINICAL DATA:  Initial evaluation for acute altered mental status. EXAM: CT HEAD WITHOUT CONTRAST TECHNIQUE: Contiguous axial images were obtained from the base of the skull through the vertex without intravenous contrast. COMPARISON:  None available. FINDINGS: Generalized  cerebral atrophy with mild chronic small vessel ischemic disease present. Prominent vascular calcifications within the carotid siphons. No acute large vessel territory infarct. No intracranial hemorrhage. No mass lesion, midline shift, or mass effect. No hydrocephalus. No extra-axial fluid collection. Scalp soft tissues demonstrate no acute abnormality. No acute abnormality about the orbits. Visualized paranasal sinuses and mastoid air cells are clear. Calvarium intact. IMPRESSION: 1. No acute intracranial process. 2. Mild age-related cerebral atrophy with chronic small vessel ischemic disease. Prominent intracranial atherosclerosis. Electronically Signed   By: Jeannine Boga M.D.   On: 12/02/2015 21:33   Dg Chest Port 1 View  12/03/2015  CLINICAL DATA:  Hypoxemia EXAM: PORTABLE CHEST 1 VIEW COMPARISON:  11/29/2015 FINDINGS: Normal heart size. Lungs clear. No pneumothorax. No pleural effusion. IMPRESSION: No active disease. Electronically Signed   By: Marybelle Killings M.D.   On: 12/03/2015 11:58   Dg Foot Complete Left  11/25/2015  CLINICAL DATA:  LEFT foot pain, necrotic great toe LEFT foot 3 months, progressively worse, history diabetes mellitus, hyperlipidemia, renal insufficiency, venous insufficiency EXAM: LEFT FOOT - COMPLETE 3+ VIEW COMPARISON:  05/30/2015 FINDINGS: Diffuse osseous demineralization. Joint spaces preserved. Soft tissue swelling at medial aspect of first MTP joint. No acute fracture, dislocation, or bone destruction. Scattered small vessel vascular calcification throughout foot. Soft tissue swelling particularly at plantar aspect of LEFT foot. IMPRESSION: No definite acute osseous abnormalities. Electronically Signed   By: Lavonia Dana M.D.   On: 11/25/2015 21:15    Labs:  Basic Metabolic Panel:  Recent Labs Lab 12/16/15 2104 12/18/15 1802 12/20/15 0804  NA 128* 133* 134*  K 3.4* 3.5 3.6  CL 89* 92*  98*  CO2 23 27 24   GLUCOSE 194* 173* 115*  BUN 42* 21* 21*  CREATININE  7.89* 6.02* 5.84*  CALCIUM 8.2* 8.0* 8.7*  PHOS 6.0* 6.2* 5.1*    CBC:  Recent Labs Lab 12/16/15 2103 12/18/15 1801 12/20/15 0804  WBC 9.4 9.0 8.4  HGB 8.8* 9.0* 9.8*  HCT 27.4* 28.4* 31.1*  MCV 91.3 91.6 94.0  PLT 355 380 390    CBG:  Recent Labs Lab 12/19/15 1203 12/19/15 1646 12/19/15 2047 12/20/15 0640 12/20/15 1306  GLUCAP 143* 169* 166* 90 119*    Brief HPI:  Carolyn Valenzuela is a 65 y.o. right handed female with history of HTN, ESRD, DMT2 with insensate neuropathy, cervical radiculopathy, R-BKA, severe PAD, RLE with dry gangrene entire right foot and failure of limb salvage attempts. She was admitted on 11/29/15 for L-BKA by Dr. Sharol Given. Post op with sedation due to narcotics, acute on chronic anemia treated with  2 units PRBC. Therapy ongoing and CIR recommended to help patient achieve prior level of independence with ADL tasks and mobility.    Hospital Course: Carolyn Valenzuela was admitted to rehab 12/04/2015 for inpatient therapies to consist of PT and OT at least three hours five days a week. Past admission physiatrist, therapy team and rehab RN have worked together to provide customized collaborative inpatient rehab. Low dose robaxin was added to help with muscle spasms and pain has been managed with use of hydrocodone on prn basis. Glipizide was held till po intake improved and was consistent. Nephro was added to promote wound healing. HS snack was added to help with am hypoglycemic episodes and glipizide was reduced to 2.5 mg daily.  HD has been ongoing on MWF and aranesp was used to help with anemia of chronic disease.   Wound VAC was removed on a week after surgery and R-BKA site has been healing well without signs or symptoms of infection. Staples remain intact and no signs or symptoms of infection was noted. She did develop urosepsis with fever and elevated WBC and was started on Vanc/Zosyn.  Blood cultures and CXR were negative.  UCS showed < 20,000 colonies of  lactobacillus and antibiotics were discontinued after 5 days as leucocytosis had resolved.  Blood pressures were monitored on bid basis and noted to be labile. Norvasc was added and titrated to 5 mg daily with better control.  Her mood has been stable and she has made steady progress to supervision at wheelchair level. She will continue to receive follow up [place], [place], [place] and [place] aide by [place] after discharge.  .     Rehab course: During patient's stay in rehab weekly team conferences were held to monitor patient's progress, set goals and discuss barriers to discharge. At admission, patient required moderate assistance with mobility and ADL tasks. She has had improvement in activity tolerance, balance, postural control, as well as ability to compensate for deficits. She is able to complete ADLs with supervision. She is modified independent for bed mobility, requires supervision for transfers and is able to ambulate 30' with supervision. Family education was completed regrading all aspects of care.     Disposition: 01-Home or Self Care  Diet:  Renal diet 1200 CC /FR.   Special Instructions: 1. Check blood sugars twice a day. 2. Contact Dr. Sharol Given for any changes to wound dressing    Medication List    STOP taking these medications        cloNIDine 0.1 MG tablet  Commonly known as:  CATAPRES     furosemide 40 MG tablet  Commonly known as:  LASIX     glipiZIDE 5 MG 24 hr tablet  Commonly known as:  GLUCOTROL XL  Replaced by:  glipiZIDE 5 MG tablet     HYDROmorphone 2 MG tablet  Commonly known as:  DILAUDID     nitroGLYCERIN 0.2 mg/hr patch  Commonly known as:  NITRODUR - Dosed in mg/24 hr     oxyCODONE-acetaminophen 5-325 MG tablet  Commonly known as:  ROXICET     RENVELA 800 MG tablet  Generic drug:  sevelamer carbonate      TAKE these medications        acetaminophen 325 MG tablet  Commonly known as:  TYLENOL  Take 2 tablets (650 mg total) by mouth every 6  (six) hours as needed for mild pain, moderate pain or fever.     amLODipine 5 MG tablet  Commonly known as:  NORVASC  Take 1 tablet (5 mg total) by mouth daily.     aspirin 81 MG EC tablet  Take 81 mg by mouth daily.     famotidine 10 MG tablet  Commonly known as:  PEPCID  Take 1 tablet (10 mg total) by mouth daily.     feeding supplement (NEPRO CARB STEADY) Liqd  Take 237 mLs by mouth 3 (three) times daily with meals.     gabapentin 100 MG capsule  Commonly known as:  NEURONTIN  Take 1 capsule by mouth 2 (two) times daily.     glipiZIDE 5 MG tablet  Commonly known as:  GLUCOTROL  Take 0.5 tablets (2.5 mg total) by mouth daily before breakfast.     HYDROcodone-acetaminophen 5-325 MG tablet  Commonly known as:  NORCO/VICODIN  TAKE 1 TABLET 2 TO 3 TIMES A DAY AS NEEDED FOR PAIN     multivitamin with minerals Tabs tablet  Take 1 tablet by mouth daily.     senna 8.6 MG Tabs tablet  Commonly known as:  SENOKOT  Take 2 tablets (17.2 mg total) by mouth 2 (two) times daily before a meal.     simvastatin 20 MG tablet  Commonly known as:  ZOCOR  Take 1 tablet (20 mg total) by mouth daily at 6 PM.     traZODone 50 MG tablet  Commonly known as:  DESYREL  Take 0.5-1 tablets (25-50 mg total) by mouth at bedtime as needed for sleep.       Follow-up Information    Follow up with Emit Kuenzel Lorie Phenix, MD.   Specialty:  Physical Medicine and Rehabilitation   Why:  office will call you with appointment   Contact information:   [place] [id number] [phone]       Follow up with Newt Minion, MD. Call today.   Specialty:  Orthopedic Surgery   Why:  for follow up appointment   Contact information:   [address] [place] [address] [phone]       Follow up with Cathlean Cower, MD On 01/01/2016.   Specialties:  Internal Medicine, Radiology   Why:  APPT @ 10:30AM   Contact information:   [address]  [id number] [phone]       Signed: Bary Leriche 12/22/2015, 11:52 PM

## 2015-12-20 NOTE — Progress Notes (Signed)
[facility]     PROGRESS NOTE  Subjective/Complaints:  Patient lying in bed this morning, about to go to HD. She is doing well and is happy to be going home.  ROS:   Denies CP, SOB, N/V/D.  Objective: Vital Signs: Blood pressure 178/62, pulse 78, temperature 98.8 F (37.1 C), temperature source Oral, resp. rate 11, weight 51.4 kg (113 lb 5.1 oz), SpO2 100 %. No results found.  Recent Labs  12/18/15 1801 12/20/15 0804  WBC 9.0 8.4  HGB 9.0* 9.8*  HCT 28.4* 31.1*  PLT 380 390    Recent Labs  12/18/15 1802 12/20/15 0804  NA 133* 134*  K 3.5 3.6  CL 92* 98*  GLUCOSE 173* 115*  BUN 21* 21*  CREATININE 6.02* 5.84*  CALCIUM 8.0* 8.7*   CBG (last 3)   Recent Labs  12/19/15 1646 12/19/15 2047 12/20/15 0640  GLUCAP 169* 166* 90    Wt Readings from Last 3 Encounters:  12/20/15 51.4 kg (113 lb 5.1 oz)  12/04/15 51 kg (112 lb 7 oz)  11/25/15 54.885 kg (121 lb)    Physical Exam:  BP 178/62 mmHg  Pulse 78  Temp(Src) 98.8 F (37.1 C) (Oral)  Resp 11  Wt 51.4 kg (113 lb 5.1 oz)  SpO2 100% Constitutional: She appears well-developed. NAD.  HENT:  Normocephalic and atraumatic.   Eyes: Conjunctivae and EOM are normal. + Murmur Cardiovascular: Normal rate and regular rhythm. Respiratory: Effort normal and breath sounds normal. No respiratory distress. She has no wheezes.  GI: Soft. Bowel sounds are normal. She exhibits no distension. There is no tenderness.  Musculoskeletal: She exhibits mild edema, no tenderness.  Bilateral BKA  Neurological: She is alert and oriented.  Motor: Bilateral upper extremities: 5/5 proximal to distal Right lower extremity: BKA, 5/5 hip flexion  Left lower extremity: Hip flexion 5/5  Skin: Skin is warm and dry.  left BKA with staples C/D/I, dusky appearance of skin of medial distal stump +? DTI left proximal tibia.  R-BKA healed well. Psychiatric: She has a normal mood and affect. Her behavior is  normal. Thought content normal.   Assessment/Plan: 1. Functional deficits secondary to to left BKA, with previous right BKA which require 3+ hours per day of interdisciplinary therapy in a comprehensive inpatient rehab setting. Physiatrist is providing close team supervision and 24 hour management of active medical problems listed below. Physiatrist and rehab team continue to assess barriers to discharge/monitor patient progress toward functional and medical goals.  Function:  Bathing Bathing position   Position: Shower  Bathing parts Body parts bathed by patient: Right arm, Left arm, Chest, Abdomen, Front perineal area, Buttocks, Right upper leg, Left upper leg Body parts bathed by helper: Back, Buttocks (A to clean around wounds)  Bathing assist Assist Level: More than reasonable time   Set up : To adjust water temperature  Upper Body Dressing/Undressing Upper body dressing   What is the patient wearing?: Bra, Pull over shirt/dress Bra - Perfomed by patient: Thread/unthread right bra strap, Thread/unthread left bra strap, Hook/unhook bra (pull down sports bra) Bra - Perfomed by helper: Thread/unthread left bra strap Pull over shirt/dress - Perfomed by patient: Thread/unthread right sleeve, Thread/unthread left sleeve, Put head through opening, Pull shirt over trunk Pull over shirt/dress - Perfomed by helper: Pull shirt over trunk        Upper body assist Assist Level: No help, No cues   Set up : To obtain clothing/put away  Lower  Body Dressing/Undressing Lower body dressing Lower body dressing/undressing activity did not occur: Refused What is the patient wearing?: Underwear Underwear - Performed by patient: Thread/unthread right underwear leg, Thread/unthread left underwear leg, Pull underwear up/down Underwear - Performed by helper: Thread/unthread right underwear leg, Thread/unthread left underwear leg Pants- Performed by patient: Thread/unthread right pants leg,  Thread/unthread left pants leg, Pull pants up/down (skirt) Pants- Performed by helper: Thread/unthread right pants leg, Thread/unthread left pants leg                      Lower body assist Assist for lower body dressing: Supervision or verbal cues      Toileting Toileting Toileting activity did not occur: N/A (anuric) Toileting steps completed by patient: Adjust clothing prior to toileting, Adjust clothing after toileting Toileting steps completed by helper: Performs perineal hygiene, Adjust clothing prior to toileting, Adjust clothing after toileting Toileting Assistive Devices: Grab bar or rail  Toileting assist Assist level: More than reasonable time   Transfers Chair/bed transfer   Chair/bed transfer method: Squat pivot Chair/bed transfer assist level: Supervision or verbal cues Chair/bed transfer assistive device: Armrests     Locomotion Ambulation Ambulation activity did not occur: Safety/medical concerns   Max distance: 68ft Assist level: Supervision or verbal cues   Wheelchair Wheelchair activity did not occur: N/A Type: Manual Max wheelchair distance: 250 Assist Level: No help, No cues, assistive device, takes more than reasonable amount of time  Cognition Comprehension Comprehension assist level: Follows basic conversation/direction with no assist  Expression Expression assist level: Expresses basic needs/ideas: With no assist  Social Interaction Social Interaction assist level: Interacts appropriately 90% of the time - Needs monitoring or encouragement for participation or interaction.  Problem Solving Problem solving assist level: Solves basic problems with no assist  Memory Memory assist level: Recognizes or recalls 90% of the time/requires cueing < 10% of the time    Medical Problem List and Plan: 1. Abnormality of gait, safety, deficits with transfers secondary to left BKA, with previous right BKA.  DC today  Stump shrinker  Will see patient for  transitional care management in 1-2 weeks  2. DVT Prophylaxis/Anticoagulation: Pharmaceutical: Lovenox 3. Pain Management:Hydrocodone prn for pain, low dose robaxin to help with muscle spasms.  4. Mood: Very motivated to get better and resume independent living. LCSW to follow for evaluation and support.  5. Neuropsych: This patient is capable of making decisions on her own behalf. 6. Skin/Wound Care: Routine pressure relief measures.   7. Fluids/Electrolytes/Nutrition: Labs being monitored ---hyponateremia/hyperkalemia being managed by HD, appreciate recs.  8. ESRD: On renal diet with 1200 FR. Hemodialysis after therapy sessions on MWF to help with tolerance of therapy.  9. DMT2: Monitor BS ac/hs.  Will remove diabetic retrictions to help with food choices/intake. Use SSI for elevated BS.   Home glipizide 5 mg on 4/8, decreased to 2.5 on 4/13   Will continue to monitor. 10 HTN:   Labile at present, however, relatively elevated, low dose Norvasc started on 4/18, increased to 5 mg on 4/20  Will make further adjustments at this time and encourage patient to monitor ambulatory blood pressures 11. Anemia of chronic disease: Continue Aransep weekly.  12. Metabolic bone disease: On renvela.  13. Hypoxia: Improving. Encourage IS.  14. Dysphagia  Patient was scheduled to get workup prior to admission  Per SLP, no significant issues 15. Urosepsis  Leukocytosis (resolved)  Empiric Vanc/Zosyn DC'd on 4/19   Stump does not show signs of  infection  Blood cultures NG  Chest x-ray from 4/12  Unremarkable  UCX with lactobacillus, less than 20,000  16. Urinary retention   patient had urinary retention this weekend, but this appears to have resolved and she is in anuric at present.   LOS (Days) 16 A FACE TO FACE EVALUATION WAS PERFORMED  Carolyn Valenzuela Lorie Phenix 12/20/2015 9:19 AM

## 2015-12-21 NOTE — Progress Notes (Signed)
Occupational Therapy Discharge Summary  Patient Details  Name: Carolyn Valenzuela MRN: [id number] Date of Birth: [birth year]  Patient has met 9 of 10 long term goals due to improved activity tolerance, improved balance, ability to compensate for deficits and improved awareness.  Patient to discharge at overall Supervision level.  Patient's care partner is independent to provide the necessary physical and cognitive assistance at discharge.   Pt made excellent progress during CIR admission and endorses recommended supervision and assist with iADL tasks d/t residual weakness during dynamic standing tasks.  Reasons goals not met: Lower body dressing tasks if standing will require support at sink; pt instructed on lateral leans while seated at edge of bed and adaptive clothing however carry-over of skill was inconsistent at time of discharge.   Recommendation:  Patient will benefit from ongoing skilled OT services in home health setting to continue to advance functional skills in the area of iADL at w/c level.  Equipment: Drop-arm BSC  Reasons for discharge: treatment goals met  Patient/family agrees with progress made and goals achieved: Yes  OT Discharge Precautions/Restrictions  Precautions Precautions: Fall Required Braces or Orthoses: Other Brace/Splint Other Brace/Splint: Using RLE prosthesis for transfers Restrictions Weight Bearing Restrictions: Yes LLE Weight Bearing: Non weight bearing  ADL ADL ADL Comments: see Functional Assessment Tool   Vision/Perception  Vision- History Baseline Vision/History: Wears glasses Wears Glasses: At all times Patient Visual Report: No change from baseline Vision- Assessment Vision Assessment?: No apparent visual deficits   Cognition Overall Cognitive Status: Within Functional Limits for tasks assessed Arousal/Alertness: Awake/alert Orientation Level: Oriented X4 Attention: Alternating Alternating Attention: Appears intact Memory: Appears  intact Awareness: Appears intact Problem Solving: Appears intact Safety/Judgment: Appears intact Comments: Mild cues for safety with gait and transfer.    Sensation Sensation Light Touch: Appears Intact Stereognosis: Appears Intact Hot/Cold: Appears Intact Proprioception: Appears Intact Coordination Gross Motor Movements are Fluid and Coordinated: Yes Fine Motor Movements are Fluid and Coordinated: Yes   Motor  Motor Motor: Within Functional Limits   Mobility  Bed Mobility Bed Mobility: Rolling Right;Rolling Left;Supine to Sit;Sit to Supine Rolling Right: 7: Independent Rolling Left: 7: Independent Supine to Sit: 6: Modified independent (Device/Increase time) Sit to Supine: 6: Modified independent (Device/Increase time) Transfers Transfers: Sit to Stand;Stand to Sit Sit to Stand: 5: Supervision Sit to Stand Details: Verbal cues for technique Stand to Sit: 5: Supervision Stand to Sit Details (indicate cue type and reason): Verbal cues for precautions/safety   Trunk/Postural Assessment  Cervical Assessment Cervical Assessment: Within Functional Limits Thoracic Assessment Thoracic Assessment: Within Functional Limits Lumbar Assessment Lumbar Assessment: Within Functional Limits Postural Control Postural Control: Within Functional Limits   Balance Balance Balance Assessed: Yes Static Sitting Balance Static Sitting - Level of Assistance: 7: Independent Dynamic Sitting Balance Dynamic Sitting - Level of Assistance: 6: Modified independent (Device/Increase time) Dynamic Sitting - Balance Activities: Forward lean/weight shifting;Reaching across midline;Reaching for weighted objects;Reaching for objects Static Standing Balance Static Standing - Balance Support: Left upper extremity supported;During functional activity;Bilateral upper extremity supported Static Standing - Level of Assistance: 6: Modified independent (Device/Increase time) Dynamic Standing Balance Dynamic  Standing - Balance Support: Left upper extremity supported;During functional activity Dynamic Standing - Level of Assistance: 5: Stand by assistance Dynamic Standing - Balance Activities: Reaching for objects Dynamic Standing - Comments: Supervision A with functional reaching tasks with 1 UE supported on RW.    Extremity/Trunk Assessment RUE Assessment RUE Assessment: Within Functional Limits LUE Assessment LUE Assessment: Within Functional Limits  See Function Navigator for Current Functional Status.  [facility] 12/21/2015, 7:43 AM

## 2015-12-25 ENCOUNTER — Telehealth: Payer: Self-pay | Admitting: *Deleted

## 2015-12-25 NOTE — Telephone Encounter (Addendum)
Call #1--Janay is not at home, she is at dialysis. Call #2 completed with [facility] Questions    1. Are you/is patient experiencing any problems since coming home? Are there any questions regarding any aspect of care? No 2. Are there any questions regarding medications administration/dosing? Are meds being taken as prescribed? Patient should review meds with caller to confirm. No questions.  Medications reviewed. 3. Have there been any falls? No 4. Has Home Health been to the house and/or have they contacted you? If not, have you tried to contact them? Can we help you contact them? Yes HH has been out and she has "plenty of care" 5. Are bowels and bladder emptying properly? Are there any unexpected incontinence issues? If applicable, is patient following bowel/bladder programs? No problems.  Dialysis M-W- F (does urinate though) 6. Any fevers, problems with breathing, unexpected pain? No 7. Are there any skin problems or new areas of breakdown? No.  Her stitches have been removed 8. Has the patient/family member arranged specialty MD follow up (ie cardiology/neurology/renal/surgical/etc)?  Can we help arrange? No, appointments have been made 9. Does the patient need any other services or support that we can help arrange? No 10. Are caregivers following through as expected in assisting the patient? Yes             Has the patient quit smoking, drinking alcohol, or using drugs as recommended?N/A  Appointment 01/02/2016 arrive 11:00 for 11:20 with Dr Posey Pronto

## 2015-12-27 ENCOUNTER — Encounter: Payer: BC Managed Care – PPO | Admitting: Physical Medicine & Rehabilitation

## 2015-12-27 ENCOUNTER — Telehealth: Payer: Self-pay

## 2015-12-27 NOTE — Telephone Encounter (Signed)
Please advise 

## 2015-12-27 NOTE — Telephone Encounter (Signed)
Verbal given 

## 2015-12-27 NOTE — Telephone Encounter (Signed)
[facility] needs virable orders for Carolyn Valenzuela for PT she has had her L legs removed and now doesn't have either and needs PT at home. Please follow up. Orders are: Once a week for one week Twice a week for each other week for 2 weeks Twice a week for 2weeks  Thank you.

## 2015-12-27 NOTE — Telephone Encounter (Signed)
Ok for verbal 

## 2015-12-31 ENCOUNTER — Encounter: Payer: Self-pay | Admitting: Internal Medicine

## 2015-12-31 ENCOUNTER — Ambulatory Visit (INDEPENDENT_AMBULATORY_CARE_PROVIDER_SITE_OTHER): Payer: BC Managed Care – PPO | Admitting: Internal Medicine

## 2015-12-31 VITALS — BP 130/60 | HR 88 | Temp 98.3°F | Resp 20 | Wt 114.0 lb

## 2015-12-31 DIAGNOSIS — N390 Urinary tract infection, site not specified: Secondary | ICD-10-CM

## 2015-12-31 DIAGNOSIS — N186 End stage renal disease: Secondary | ICD-10-CM

## 2015-12-31 DIAGNOSIS — E1122 Type 2 diabetes mellitus with diabetic chronic kidney disease: Secondary | ICD-10-CM

## 2015-12-31 DIAGNOSIS — I10 Essential (primary) hypertension: Secondary | ICD-10-CM | POA: Diagnosis not present

## 2015-12-31 MED ORDER — AMLODIPINE BESYLATE 10 MG PO TABS: 10.0000 mg | ORAL_TABLET | Freq: Every day | ORAL | Status: DC

## 2015-12-31 NOTE — Progress Notes (Signed)
Pre visit review using our clinic review tool, if applicable. No additional management support is needed unless otherwise documented below in the visit note. 

## 2015-12-31 NOTE — Patient Instructions (Signed)
Ok to increase the amlodipine to 10 mg per day  Please continue all other medications as before, and refills have been done if requested.  Please have the pharmacy call with any other refills you may need.  Please continue your efforts at being more active, low cholesterol diet, and weight control.  Please keep your appointments with your specialists as you may have planned  Please return in 6 months, or sooner if needed

## 2015-12-31 NOTE — Progress Notes (Signed)
Subjective:    Patient ID: Carolyn Valenzuela, female    DOB: Dec 07, 1950, 65 y.o.   MRN: [id number]  HPI  S/p recent hospn with bilat BKA and UTIsepsis in the setting of ESRD on HD M-W-F, had low sugars so glipizide reduced, and lasix/ clonoidine stopper.  BP at dialysis initiialy up to 200 usually, better after.  Denies urinary symptoms such as dysuria, frequency, urgency, flank pain, hematuria or n/v, fever, chills.   BP Readings from Last 3 Encounters:  12/31/15 130/60  12/20/15 164/73  12/04/15 140/58  During patient's stay in rehab weekly team conferences were held to monitor patient's progress, set goals and discuss barriers to discharge. At admission, patient required moderate assistance with mobility and ADL tasks. She has had improvement in activity tolerance, balance, postural control, as well as ability to compensate for deficits. She is able to complete ADLs with supervision. She is modified independent for bed mobility, requires supervision for transfers and is able to ambulate 30' with supervision. Family education was completed regrading all aspects of care.  Pt denies chest pain, increased sob or doe, wheezing, orthopnea, PND, increased LE swelling, palpitations, dizziness or syncope.  Pt denies new neurological symptoms such as new headache, or facial or extremity weakness or numbness   Pt denies polydipsia, polyuria.  Denies urinary symptoms such as dysuria, frequency, urgency, flank pain, hematuria or n/v, fever, chills. Past Medical History  Diagnosis Date  . DIABETES MELLITUS, UNCONTROLLED 05/21/2009  . HYPERLIPIDEMIA 03/30/2007  . ANEMIA-IRON DEFICIENCY 01/25/2008  . HYPERTENSION 01/25/2008  . SINUSITIS- ACUTE-NOS 01/25/2008  . SECONDARY HYPERPARATHYROIDISM 05/21/2009  . CERVICAL RADICULOPATHY, LEFT 01/25/2008  . BACK PAIN 05/02/2009  . NUMBNESS 05/21/2009  . Allergic rhinitis, cause unspecified 12/08/2010  . Foot ulcer ([place])     right lateral malleolus  . Critical lower limb  ischemia   . RENAL INSUFFICIENCY 02/01/2008    HD pt.   Past Surgical History  Procedure Laterality Date  . Av fistula placement Left   . Eye surgery Bilateral     cataracts removed, left eye still has some oil in it.  . Colonoscopy    . Amputation Right 03/15/2014    Procedure: RIGHT  LEG  BELOW KNEE AMPUTATION ;  Surgeon: [name], MD;  [facility];  Service: Orthopedics;  Laterality: Right;  . Insertion of dialysis catheter N/A 09/03/2014    Procedure: INSERTION OF DIALYSIS CATHETER RIGHT INTERNAL JUGULAR VEIN;  Surgeon: Conrad Colquitt, MD;  Location: [facility];  Service: Vascular;  Laterality: N/A;  . Av fistula placement Left 09/06/2014    Procedure: ARTERIOVENOUS (AV) FISTULA CREATION-Left Brachiocephalic;  Surgeon: Conrad Toomsboro, MD;  Location: [facility];  Service: Vascular;  Laterality: Left;  . Peripheral vascular catheterization N/A 08/13/2015    Procedure: Abdominal Aortogram;  Surgeon: Elam Dutch, MD;  Location: [place] [facility];  Service: Cardiovascular;  Laterality: N/A;  . Amputation Left 11/29/2015    Procedure: AMPUTATION BELOW KNEE;  Surgeon: Newt Minion, MD;  Location: [facility];  Service: Orthopedics;  Laterality: Left;    reports that she has never smoked. She has never used smokeless tobacco. She reports that she does not drink alcohol or use illicit drugs. family history includes Cancer in her mother. Allergies  Allergen Reactions  . Oxycodone Itching   Current Outpatient Prescriptions on File Prior to Visit  Medication Sig Dispense Refill  . acetaminophen (TYLENOL) 325 MG tablet Take 2 tablets (650 mg total) by mouth every 6 (  six) hours as needed for mild pain, moderate pain or fever.    [name] aspirin 81 MG EC tablet Take 81 mg by mouth daily.      . famotidine (PEPCID) 10 MG tablet Take 1 tablet (10 mg total) by mouth daily. 30 tablet 1  . gabapentin (NEURONTIN) 100 MG capsule Take 1 capsule by mouth 2 (two) times daily.  3  . glipiZIDE (GLUCOTROL) 5 MG tablet Take 0.5  tablets (2.5 mg total) by mouth daily before breakfast. 30 tablet 0  . Multiple Vitamin (MULTIVITAMIN WITH MINERALS) TABS tablet Take 1 tablet by mouth daily.    . Nutritional Supplements (FEEDING SUPPLEMENT, NEPRO CARB STEADY,) LIQD Take 237 mLs by mouth 3 (three) times daily with meals. 60 Can 0  . senna (SENOKOT) 8.6 MG TABS tablet Take 2 tablets (17.2 mg total) by mouth 2 (two) times daily before a meal. 120 each 1  . simvastatin (ZOCOR) 20 MG tablet Take 1 tablet (20 mg total) by mouth daily at 6 PM. 90 tablet 3  . traZODone (DESYREL) 50 MG tablet Take 0.5-1 tablets (25-50 mg total) by mouth at bedtime as needed for sleep. 30 tablet 1   No current facility-administered medications on file prior to visit.   Review of Systems  Constitutional: Negative for unusual diaphoresis or night sweats HENT: Negative for ear swelling or discharge Eyes: Negative for worsening visual haziness  Respiratory: Negative for choking and stridor.   Gastrointestinal: Negative for distension or worsening eructation Genitourinary: Negative for retention or change in urine volume.  Musculoskeletal: Negative for other MSK pain or swelling Skin: Negative for color change and worsening wound Neurological: Negative for tremors and numbness other than noted  Psychiatric/Behavioral: Negative for decreased concentration or agitation other than above       Objective:   Physical Exam BP 130/60 mmHg  Pulse 88  Temp(Src) 98.3 F (36.8 C) (Oral)  Resp 20  Wt 114 lb (51.71 kg)  SpO2 96% BP 130/60 mmHg  Pulse 88  Temp(Src) 98.3 F (36.8 C) (Oral)  Resp 20  Wt 114 lb (51.71 kg)  SpO2 96% VS noted,  Constitutional: Pt appears in no apparent distress HENT: Head: NCAT.  Right Ear: External ear normal.  Left Ear: External ear normal.  Eyes: . Pupils are equal, round, and reactive to light. Conjunctivae and EOM are normal Neck: Normal range of motion. Neck supple.  Cardiovascular: Normal rate and regular rhythm.    Pulmonary/Chest: Effort normal and breath sounds without rales or wheezing.  Abd:  Soft, NT, ND, + BS Neurological: Pt is alert. Not confused , motor grossly intact Skin: Skin is warm. No rash, no LE edema Psychiatric: Pt behavior is normal. No agitation.  S/p bilat BKA    Assessment & Plan:

## 2016-01-01 ENCOUNTER — Inpatient Hospital Stay: Payer: BC Managed Care – PPO | Admitting: Internal Medicine

## 2016-01-02 ENCOUNTER — Encounter
Payer: BC Managed Care – PPO | Attending: Physical Medicine & Rehabilitation | Admitting: Physical Medicine & Rehabilitation

## 2016-01-02 ENCOUNTER — Encounter: Payer: Self-pay | Admitting: Physical Medicine & Rehabilitation

## 2016-01-02 VITALS — BP 163/88 | HR 82

## 2016-01-02 DIAGNOSIS — E785 Hyperlipidemia, unspecified: Secondary | ICD-10-CM | POA: Insufficient documentation

## 2016-01-02 DIAGNOSIS — E1122 Type 2 diabetes mellitus with diabetic chronic kidney disease: Secondary | ICD-10-CM | POA: Insufficient documentation

## 2016-01-02 DIAGNOSIS — N2581 Secondary hyperparathyroidism of renal origin: Secondary | ICD-10-CM | POA: Diagnosis not present

## 2016-01-02 DIAGNOSIS — R262 Difficulty in walking, not elsewhere classified: Secondary | ICD-10-CM | POA: Diagnosis not present

## 2016-01-02 DIAGNOSIS — G8929 Other chronic pain: Secondary | ICD-10-CM | POA: Diagnosis not present

## 2016-01-02 DIAGNOSIS — I120 Hypertensive chronic kidney disease with stage 5 chronic kidney disease or end stage renal disease: Secondary | ICD-10-CM | POA: Insufficient documentation

## 2016-01-02 DIAGNOSIS — R200 Anesthesia of skin: Secondary | ICD-10-CM | POA: Insufficient documentation

## 2016-01-02 DIAGNOSIS — G8918 Other acute postprocedural pain: Secondary | ICD-10-CM | POA: Diagnosis not present

## 2016-01-02 DIAGNOSIS — Z992 Dependence on renal dialysis: Secondary | ICD-10-CM | POA: Insufficient documentation

## 2016-01-02 DIAGNOSIS — Z993 Dependence on wheelchair: Secondary | ICD-10-CM | POA: Insufficient documentation

## 2016-01-02 DIAGNOSIS — Z79899 Other long term (current) drug therapy: Secondary | ICD-10-CM | POA: Insufficient documentation

## 2016-01-02 DIAGNOSIS — G629 Polyneuropathy, unspecified: Secondary | ICD-10-CM | POA: Diagnosis not present

## 2016-01-02 DIAGNOSIS — N186 End stage renal disease: Secondary | ICD-10-CM | POA: Diagnosis not present

## 2016-01-02 DIAGNOSIS — I150 Renovascular hypertension: Secondary | ICD-10-CM

## 2016-01-02 DIAGNOSIS — R269 Unspecified abnormalities of gait and mobility: Secondary | ICD-10-CM | POA: Diagnosis not present

## 2016-01-02 DIAGNOSIS — I739 Peripheral vascular disease, unspecified: Secondary | ICD-10-CM | POA: Insufficient documentation

## 2016-01-02 DIAGNOSIS — R531 Weakness: Secondary | ICD-10-CM | POA: Diagnosis not present

## 2016-01-02 DIAGNOSIS — Z89511 Acquired absence of right leg below knee: Secondary | ICD-10-CM

## 2016-01-02 DIAGNOSIS — M549 Dorsalgia, unspecified: Secondary | ICD-10-CM | POA: Diagnosis not present

## 2016-01-02 DIAGNOSIS — M5412 Radiculopathy, cervical region: Secondary | ICD-10-CM | POA: Diagnosis not present

## 2016-01-02 DIAGNOSIS — Z89512 Acquired absence of left leg below knee: Secondary | ICD-10-CM | POA: Insufficient documentation

## 2016-01-02 DIAGNOSIS — E1152 Type 2 diabetes mellitus with diabetic peripheral angiopathy with gangrene: Secondary | ICD-10-CM

## 2016-01-02 NOTE — Progress Notes (Signed)
Subjective:    Patient ID: Carolyn Valenzuela, female    DOB: Sep 15, 1950, 65 y.o.   MRN: [id number]  HPI  65 y.o. right handed female with history of HTN, ESRD, DMT2 with insensate neuropathy, cervical radiculopathy, R-BKA, severe PAD presents for transitional care management after CIR admission for left BKA.   Admit date: 12/04/2015 Discharge date: 12/20/2015 At discharge, pt was encouraged to check her CBGs, which have been running in the low 100s. She was seen by Dr. Jess Barters nurse, who removed the staples.  She also saw her PCP.  She has not had an issues with dialysis.  Her BP is being managed by her PCP.  She is receiving pain meds from Dr Sharol Given.  Therapies: To start next week  DME: Tub shower chair will not fit in pt's bathroom, so she is using a wash cloth.  She will have someone come to help her in the shower. Mobility: Wheelchair  Pain Inventory Average Pain 9 Pain Right Now 3 My pain is intermittent, sharp and aching  In the last 24 hours, has pain interfered with the following? General activity 5 Relation with others 5 Enjoyment of life 5 What TIME of day is your pain at its worst? night Sleep (in general) NA  Pain is worse with: some activites Pain improves with: injections Relief from Meds: 8  Mobility ability to climb steps?  no do you drive?  no use a wheelchair transfers alone  Function disabled: date disabled . I need assistance with the following:  dressing, bathing, toileting, meal prep, household duties and shopping  Neuro/Psych weakness trouble walking  Prior Studies Any changes since last visit?  no  Physicians involved in your care [place]   Family History  Problem Relation Age of Onset  . Cancer Mother     Pancreatic   Social History   Social History  . Marital Status: Single    Spouse Name: N/A  . Number of Children: N/A  . Years of Education: N/A   Occupational History  . [employer]  History Main Topics  . Smoking status: Never Smoker   . Smokeless tobacco: Never Used  . Alcohol Use: No  . Drug Use: No  . Sexual Activity: Not Asked   Other Topics Concern  . None   Social History Narrative   Past Surgical History  Procedure Laterality Date  . Av fistula placement Left   . Eye surgery Bilateral     cataracts removed, left eye still has some oil in it.  . Colonoscopy    . Amputation Right 03/15/2014    Procedure: RIGHT  LEG  BELOW KNEE AMPUTATION ;  Surgeon: [name], MD;  Location: [facility];  Service: Orthopedics;  Laterality: Right;  . Insertion of dialysis catheter N/A 09/03/2014    Procedure: INSERTION OF DIALYSIS CATHETER RIGHT INTERNAL JUGULAR VEIN;  Surgeon: Conrad Pancoastburg, MD;  Location: [facility];  Service: Vascular;  Laterality: N/A;  . Av fistula placement Left 09/06/2014    Procedure: ARTERIOVENOUS (AV) FISTULA CREATION-Left Brachiocephalic;  Surgeon: Conrad [place], MD;  Location: [facility];  Service: Vascular;  Laterality: Left;  . Peripheral vascular catheterization N/A 08/13/2015    Procedure: Abdominal Aortogram;  Surgeon: Elam Dutch, MD;  Location: [facility];  Service: Cardiovascular;  Laterality: N/A;  . Amputation Left 11/29/2015    Procedure: AMPUTATION BELOW KNEE;  Surgeon: Newt Minion, MD;  Location: [facility];  Service:  Orthopedics;  Laterality: Left;   Past Medical History  Diagnosis Date  . DIABETES MELLITUS, UNCONTROLLED 05/21/2009  . HYPERLIPIDEMIA 03/30/2007  . ANEMIA-IRON DEFICIENCY 01/25/2008  . HYPERTENSION 01/25/2008  . SINUSITIS- ACUTE-NOS 01/25/2008  . SECONDARY HYPERPARATHYROIDISM 05/21/2009  . CERVICAL RADICULOPATHY, LEFT 01/25/2008  . BACK PAIN 05/02/2009  . NUMBNESS 05/21/2009  . Allergic rhinitis, cause unspecified 12/08/2010  . Foot ulcer ([place])     right lateral malleolus  . Critical lower limb ischemia   . RENAL INSUFFICIENCY 02/01/2008    HD pt.   BP 163/88 mmHg  Pulse 82  SpO2 93%  Opioid Risk Score:   Fall Risk Score:   `1  Depression screen PHQ 2/9  Depression screen [facility] 2/9 01/02/2016 05/28/2015 11/26/2014 09/06/2013  Decreased Interest 0 0 0 0  Down, Depressed, Hopeless 0 0 0 0  PHQ - 2 Score 0 0 0 0  Altered sleeping 0 - 1 -  Tired, decreased energy 0 - 2 -  Change in appetite 0 - 0 -  Feeling bad or failure about yourself  0 - 0 -  Trouble concentrating 0 - 0 -  Moving slowly or fidgety/restless 0 - 0 -  Suicidal thoughts 0 - 0 -  PHQ-9 Score 0 - 3 -   Review of Systems  Musculoskeletal: Positive for gait problem.  Neurological:       Denies phantom pain  All other systems reviewed and are negative.     Objective:   Physical Exam Constitutional: She appears well-developed. NAD.   HENT:  Normocephalic and atraumatic.     Eyes: Conjunctivae and EOM are normal. + Murmur Cardiovascular: Normal rate and regular rhythm.  Respiratory: Effort normal and breath sounds normal. No respiratory distress.    GI: Soft. Bowel sounds are normal. She exhibits no distension..  Musculoskeletal: No edema, no tenderness.  Bilateral BKA  Neurological: She is alert and oriented.  Motor: Bilateral upper extremities: 5/5 proximal to distal Right lower extremity: BKA, 5/5 hip flexion   Left lower extremity: Hip flexion 5/5  Skin: Skin is warm and dry.  left BKA staples removed, well approximated, very small areas not fully closed, dusky appearance of skin of medial distal stump DTI left proximal tibia.  R-BKA healed well. Psychiatric: She has a normal mood and affect. Her behavior is normal. Thought content normal.     Assessment & Plan:  64 y.o. right handed female with history of HTN, ESRD, DMT2 with insensate neuropathy, cervical radiculopathy, R-BKA, severe PAD presents for transitional care management after CIR admission for left BKA.   1. Left BKA  Start therapies next week  Follow up with Dr. Sharol Given for wound management    2. Abnormality of gait  Cont wheelchair for safety  Progress to walker with  PT  3. Incisional pain  Hydocodone per Dr. Sharol Given, attempt to wean  4. ESRD  Cont recs per Nephro  5. DM type 2  Appear to be well controlled  Cont to follow with PCP for further adjustments   6.  HTN:   Labile, especially with HD  Cont to make adjustments as necessary per PCP   Medications reviewed Referrals reviewed All questions answered

## 2016-01-05 NOTE — Assessment & Plan Note (Signed)
elev today, ok to increase the amldodipine to 10 qd, o/w stable overall by history and exam, recent data reviewed with pt, and pt to continue medical treatment as before,  to f/u any worsening symptoms or concerns BP Readings from Last 3 Encounters:  01/02/16 163/88  12/31/15 130/60  12/20/15 164/73

## 2016-01-05 NOTE — Assessment & Plan Note (Signed)
Asympt, resolved,  to f/u any worsening symptoms or concerns

## 2016-01-05 NOTE — Assessment & Plan Note (Signed)
Uncontrolled recently but now more stable post hospn and rehab, cont current tx, recent data reviewed with pt, and pt to continue medical treatment as before,  to f/u any worsening symptoms or concerns Lab Results  Component Value Date   HGBA1C 11.5* 05/28/2015

## 2016-01-07 IMAGING — CR DG CHEST 1V PORT
1 series · 1 of 1 positions shown · non-contrast
Comparison: 09/07/2014

CLINICAL DATA: Pneumonia.

EXAM:
PORTABLE CHEST - 1 VIEW

[AP]
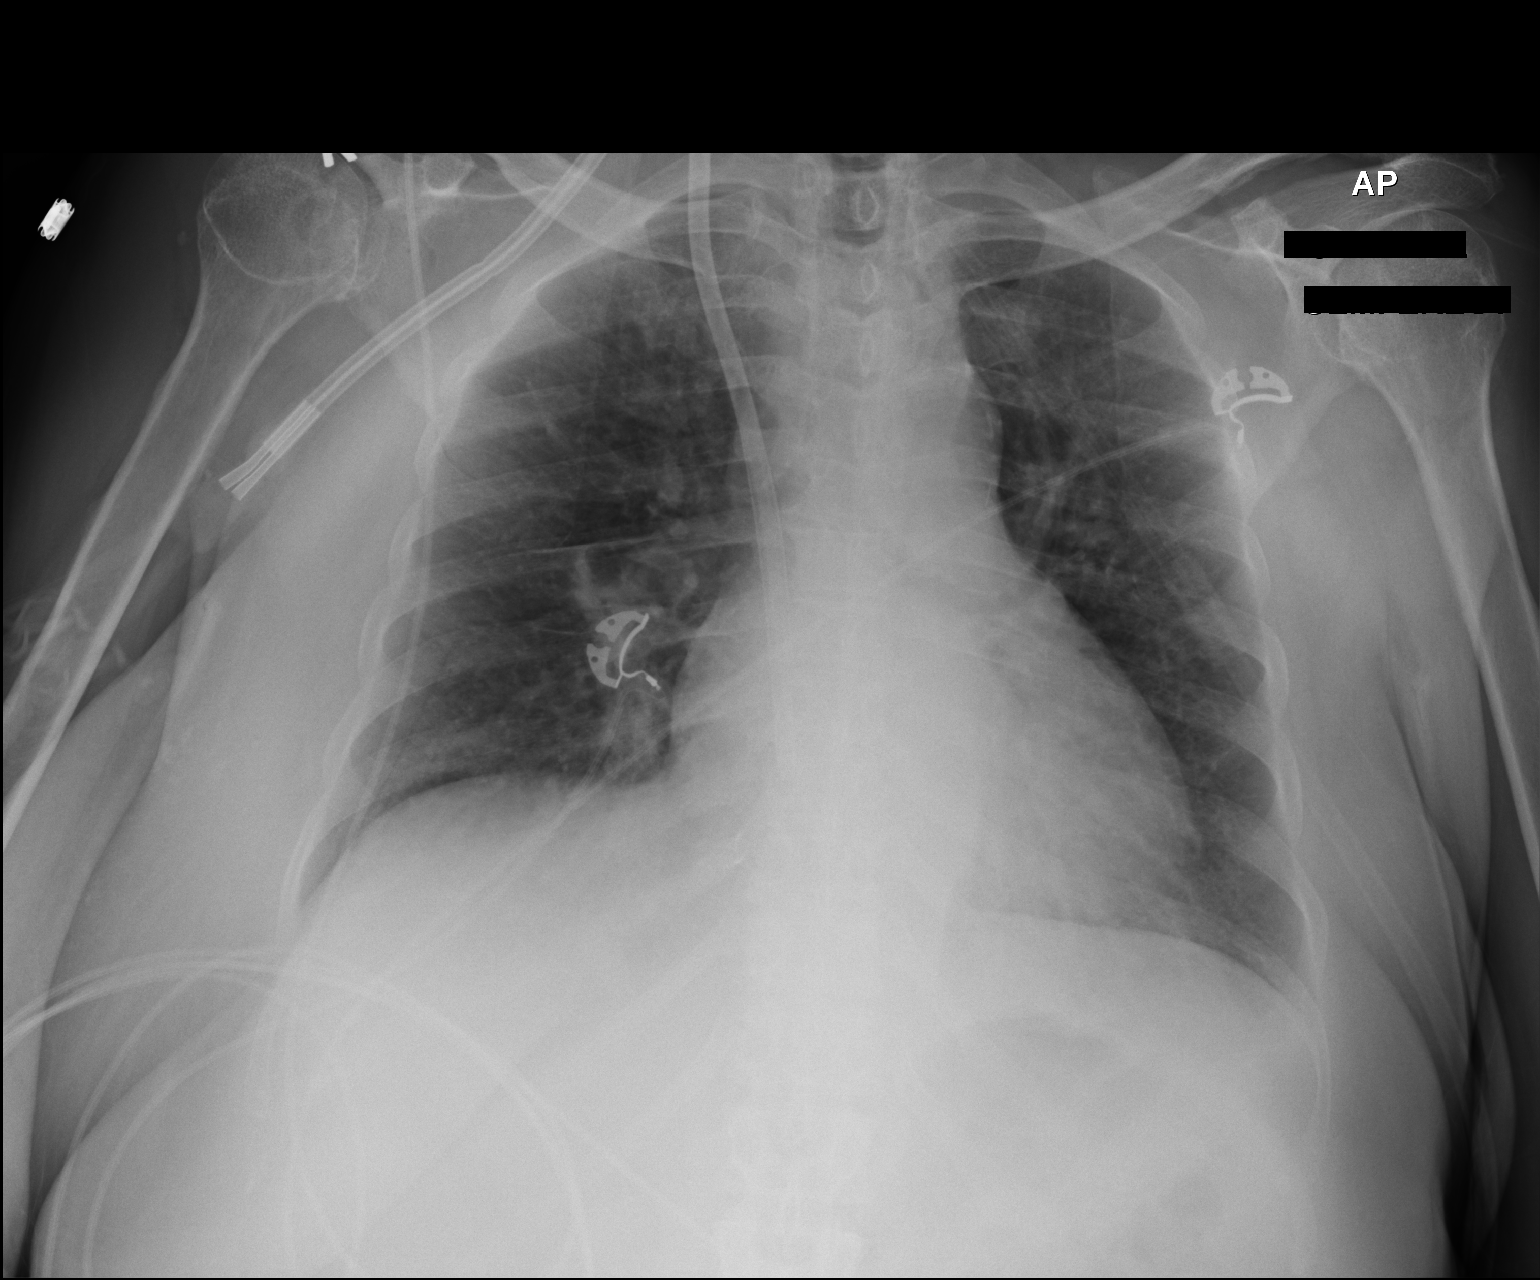

[1 of 1 positions shown; findings below may reference images not displayed]

FINDINGS: Stable cardiac and mediastinal contours. Right internal jugular
central venous catheter is present with tip projecting over the
right atrium. Minimal heterogeneous opacities left lung base.
Pulmonary vascular redistribution. No pleural effusion or
pneumothorax.
IMPRESSION: Persistent minimal heterogeneous opacities left lung base,
potentially secondary to infection. Short-term radiographic
follow-up is recommended to ensure resolution.

## 2016-01-09 ENCOUNTER — Other Ambulatory Visit: Payer: Self-pay | Admitting: *Deleted

## 2016-01-09 MED ORDER — ONETOUCH DELICA LANCETS FINE MISC
Status: DC
Start: 2016-01-09 — End: 2017-01-05

## 2016-01-09 MED ORDER — GLUCOSE BLOOD VI STRP: 1.0000 | ORAL_STRIP | Freq: Two times a day (BID) | Status: DC

## 2016-01-09 NOTE — Telephone Encounter (Signed)
Receive call pt states she is out of her testing strips. Verified which BS monitior she use sent one touch strips to [id number]

## 2016-01-16 ENCOUNTER — Other Ambulatory Visit: Payer: Self-pay | Admitting: Internal Medicine

## 2016-01-21 ENCOUNTER — Encounter: Payer: Self-pay | Admitting: Gastroenterology

## 2016-01-31 DIAGNOSIS — E876 Hypokalemia: Secondary | ICD-10-CM | POA: Insufficient documentation

## 2016-02-14 ENCOUNTER — Other Ambulatory Visit: Payer: Self-pay | Admitting: Physical Medicine and Rehabilitation

## 2016-02-18 ENCOUNTER — Other Ambulatory Visit: Payer: Self-pay | Admitting: Physical Medicine and Rehabilitation

## 2016-02-18 NOTE — Telephone Encounter (Signed)
We can refill this one time, but this medication should be going through her PCP. Thanks.

## 2016-02-18 NOTE — Telephone Encounter (Signed)
rec'd electronic request for trazodone refill, last dispensed by Reesa Chew at hospital discharge....please advise

## 2016-02-24 ENCOUNTER — Other Ambulatory Visit: Payer: Self-pay | Admitting: Physical Medicine and Rehabilitation

## 2016-02-25 ENCOUNTER — Telehealth: Payer: Self-pay | Admitting: Internal Medicine

## 2016-02-25 DIAGNOSIS — R269 Unspecified abnormalities of gait and mobility: Secondary | ICD-10-CM

## 2016-02-25 NOTE — Telephone Encounter (Signed)
Patient states she has her prosthetic in.  Would like order for PT and OT for balance training and gate training with prosthetic.

## 2016-02-25 NOTE — Telephone Encounter (Signed)
Please advise 

## 2016-02-25 NOTE — Telephone Encounter (Signed)
I think she means outpatient PT -  Will order

## 2016-03-04 ENCOUNTER — Other Ambulatory Visit: Payer: Self-pay | Admitting: Physical Medicine and Rehabilitation

## 2016-03-12 ENCOUNTER — Ambulatory Visit (INDEPENDENT_AMBULATORY_CARE_PROVIDER_SITE_OTHER): Payer: BC Managed Care – PPO | Admitting: Internal Medicine

## 2016-03-12 ENCOUNTER — Encounter: Payer: Self-pay | Admitting: Internal Medicine

## 2016-03-12 ENCOUNTER — Other Ambulatory Visit: Payer: Self-pay | Admitting: Physical Medicine and Rehabilitation

## 2016-03-12 VITALS — BP 136/80 | HR 79 | Temp 98.4°F | Resp 20 | Wt 114.0 lb

## 2016-03-12 DIAGNOSIS — I10 Essential (primary) hypertension: Secondary | ICD-10-CM | POA: Diagnosis not present

## 2016-03-12 DIAGNOSIS — R1310 Dysphagia, unspecified: Secondary | ICD-10-CM

## 2016-03-12 DIAGNOSIS — E1122 Type 2 diabetes mellitus with diabetic chronic kidney disease: Secondary | ICD-10-CM | POA: Diagnosis not present

## 2016-03-12 DIAGNOSIS — N186 End stage renal disease: Secondary | ICD-10-CM | POA: Diagnosis not present

## 2016-03-12 LAB — POCT GLYCOSYLATED HEMOGLOBIN (HGB A1C): Hemoglobin A1C: 8.3

## 2016-03-12 MED ORDER — GLIPIZIDE ER 10 MG PO TB24: 10.0000 mg | ORAL_TABLET | Freq: Every day | ORAL | Status: DC

## 2016-03-12 NOTE — Progress Notes (Signed)
Pre visit review using our clinic review tool, if applicable. No additional management support is needed unless otherwise documented below in the visit note. 

## 2016-03-12 NOTE — Assessment & Plan Note (Signed)
Only on glipizide for now, last a1c quite high, POC A1c today - 8.3, ok to increase the glipizide to 10 qd, cont diet

## 2016-03-12 NOTE — Assessment & Plan Note (Signed)
With odynophagia at risk for aspiration (none so far), doubt PPI needed at this time, cont pepcid, but also refer GI , likely needs EGD and ? Dilation, also needs f/u screening colonoscopy as she is due,

## 2016-03-12 NOTE — Patient Instructions (Addendum)
OK to increase the glipizide to 10 mg, as your a1c today was 8.3 (with the goal being less than 7)  Please continue all other medications as before, and refills have been done if requested.  Please have the pharmacy call with any other refills you may need.  Please continue your efforts at being more active, low cholesterol diet, and weight control.  Please keep your appointments with your specialists as you may have planned  You will be contacted regarding the referral for: Gastroenterology (Dr Fuller Plan) - you will hear soon  Please return in 6 months, or sooner if needed

## 2016-03-12 NOTE — Assessment & Plan Note (Signed)
Pt c/o some low BP with HD, I asked her to f/u with renal for this, o/w stable overall by history and exam, recent data reviewed with pt, and pt to continue medical treatment as before,  to f/u any worsening symptoms or concerns BP Readings from Last 3 Encounters:  03/12/16 136/80  01/02/16 163/88  12/31/15 130/60

## 2016-03-12 NOTE — Progress Notes (Signed)
Subjective:    Patient ID: Carolyn Valenzuela, female    DOB: Aug 06, 1951, 65 y.o.   MRN: [id number]  HPI  Here to f/u issues with swallowing without pain currently, but has had intermittent difficulty with pain on swallowin - pills are ok , and fluids only sometimes and issues, but mostly with solid foods with some obstructed feeling near the neck, but then pain sometimes tracks down to the stomach in mid chest. Has tried antacid OTC but has not helped, not sure which one she tried.  Symptoms intermittent > 3 mo, and more consitently occuring with each swallow in last few weeks.  Was noted at last card visit note on mar 2017, but pt did not think bad enough until now to mention here. Appetite ok, wt stable, in fact has gained a few lbs. Wt Readings from Last 3 Encounters:  03/12/16 114 lb (51.71 kg)  12/31/15 114 lb (51.71 kg)  12/20/15 108 lb 7.5 oz (49.2 kg)  Also sharp pains to LLQ when has trouble swallowing. But Denies worsening reflux, ither abd pain, dysphagia, bowel change or blood.  Does have frequent n/v a few times per week, only assoc with difficult swallowing times.  No fever, known thrush.  Nothing else seems to make dysphagia/odynophagia better or worse, except vomiting does seem to help.  Still with HD - M-W-F, sometimes has a pressure drop with this.  No hx of prior egd, last colonoscopy 2007 - would be due now for f/u. [name]  No hx of aspirate pna.   Pt denies polydipsia, polyuria, or low sugar symptoms such as weakness or confusion improved with po intake.  Pt states overall good compliance with meds, trying to follow lower cholesterol, diabetic diet, last a1c sept 2016 uncontrolled Past Medical History  Diagnosis Date  . DIABETES MELLITUS, UNCONTROLLED 05/21/2009  . HYPERLIPIDEMIA 03/30/2007  . ANEMIA-IRON DEFICIENCY 01/25/2008  . HYPERTENSION 01/25/2008  . SINUSITIS- ACUTE-NOS 01/25/2008  . SECONDARY HYPERPARATHYROIDISM 05/21/2009  . CERVICAL RADICULOPATHY, LEFT 01/25/2008  . BACK PAIN  05/02/2009  . NUMBNESS 05/21/2009  . Allergic rhinitis, cause unspecified 12/08/2010  . Foot ulcer ([place])     right lateral malleolus  . Critical lower limb ischemia   . RENAL INSUFFICIENCY 02/01/2008    HD pt.   Past Surgical History  Procedure Laterality Date  . Av fistula placement Left   . Eye surgery Bilateral     cataracts removed, left eye still has some oil in it.  . Colonoscopy    . Amputation Right 03/15/2014    Procedure: RIGHT  LEG  BELOW KNEE AMPUTATION ;  Surgeon: [name], MD;  Location: [facility];  Service: Orthopedics;  Laterality: Right;  . Insertion of dialysis catheter N/A 09/03/2014    Procedure: INSERTION OF DIALYSIS CATHETER RIGHT INTERNAL JUGULAR VEIN;  Surgeon: Conrad Imperial, MD;  Location: [facility];  Service: Vascular;  Laterality: N/A;  . Av fistula placement Left 09/06/2014    Procedure: ARTERIOVENOUS (AV) FISTULA CREATION-Left Brachiocephalic;  Surgeon: Conrad [place] Run, MD;  Location: [facility];  Service: Vascular;  Laterality: Left;  . Peripheral vascular catheterization N/A 08/13/2015    Procedure: Abdominal Aortogram;  Surgeon: Elam Dutch, MD;  Location: [facility];  Service: Cardiovascular;  Laterality: N/A;  . Amputation Left 11/29/2015    Procedure: AMPUTATION BELOW KNEE;  Surgeon: Newt Minion, MD;  Location: [facility];  Service: Orthopedics;  Laterality: Left;    reports that she has never smoked. She has  never used smokeless tobacco. She reports that she does not drink alcohol or use illicit drugs. family history includes Cancer in her mother. Allergies  Allergen Reactions  . Oxycodone Itching   Review of Systems  Constitutional: Negative for unusual diaphoresis or night sweats HENT: Negative for ear swelling or discharge Eyes: Negative for worsening visual haziness  Respiratory: Negative for choking and stridor.   Gastrointestinal: Negative for distension or worsening eructation Genitourinary: Negative for retention or change in urine volume.    Musculoskeletal: Negative for other MSK pain or swelling Skin: Negative for color change and worsening wound Neurological: Negative for tremors and numbness other than noted  Psychiatric/Behavioral: Negative for decreased concentration or agitation other than above       Objective:   Physical Exam BP 136/80 mmHg  Pulse 79  Temp(Src) 98.4 F (36.9 C) (Oral)  Resp 20  Wt 114 lb (51.71 kg)  SpO2 98% VS noted,  Constitutional: Pt appears in no apparent distress HENT: Head: NCAT.  Right Ear: External ear normal.  Left Ear: External ear normal.  Eyes: . Pupils are equal, round, and reactive to light. Conjunctivae and EOM are normal Neck: Normal range of motion. Neck supple.  Cardiovascular: Normal rate and regular rhythm.   Pulmonary/Chest: Effort normal and breath sounds without rales or wheezing.  Abd:  Soft, NT, ND, + BS Neurological: Pt is alert. Not confused , motor grossly intact Skin: Skin is warm. No rash, no LE edema Psychiatric: Pt behavior is normal. No agitation.  S/p left knee BKA.  Most recent CT ABD/PELVIS - NOV 2012 IMPRESSION:  1. Enlarged and partially calcified fibroid uterus. 2. Negative for nephrolithiasis or obstructive uropathy.  Original Report Authenticated By: Angelita Ingles, M.D.     Lab Results  Component Value Date   WBC 8.4 12/20/2015   HGB 9.8* 12/20/2015   HCT 31.1* 12/20/2015   PLT 390 12/20/2015   GLUCOSE 115* 12/20/2015   CHOL 213* 05/28/2015   TRIG 131.0 05/28/2015   HDL 52.30 05/28/2015   LDLDIRECT 116.6 06/08/2012   LDLCALC 135* 05/28/2015   ALT 10* 12/11/2015   AST 35 12/11/2015   NA 134* 12/20/2015   K 3.6 12/20/2015   CL 98* 12/20/2015   CREATININE 5.84* 12/20/2015   BUN 21* 12/20/2015   CO2 24 12/20/2015   TSH 1.52 05/28/2015   INR 1.22 12/11/2015   HGBA1C 11.5* 05/28/2015   MICROALBUR 14.2* 08/29/2009   A1c today (POC) - 8.3        Assessment & Plan:

## 2016-03-13 ENCOUNTER — Telehealth: Payer: Self-pay

## 2016-03-13 DIAGNOSIS — R1310 Dysphagia, unspecified: Secondary | ICD-10-CM

## 2016-03-13 NOTE — Telephone Encounter (Signed)
-----   Message from Ladene Artist, MD sent at 03/12/2016 10:42 AM EDT ----- Regarding: FW: egd and colonoscopy Jamyria Ozanich, please schedule barium esophagram first and colon/EGD next. If too long of wait to do colon/EGD please schedule EGD first and colon later. Thx. MS  ----- Message -----    From: Biagio Borg, MD    Sent: 03/12/2016  10:36 AM      To: Ladene Artist, MD Subject: egd and colonoscopy                            Dr Fuller Plan.,   This pt last colonoscopy was 2007, and now with recent worsening dysphagia/odynophagia, now at increased risk for aspiration.  She likely needs EGD and colonoscopy, to which she agrees.  I was hoping you might be able to have her scheduled soon.   thanks

## 2016-03-13 NOTE — Telephone Encounter (Signed)
Patient has been scheduled for BS at [facility] for 03/20/16 10:00 Left message for patient to call back

## 2016-03-17 NOTE — Telephone Encounter (Signed)
Left message for patient to call back  

## 2016-03-18 ENCOUNTER — Observation Stay (HOSPITAL_COMMUNITY)
Admission: EM | Admit: 2016-03-18 | Discharge: 2016-03-20 | Disposition: A | Discharge status: Home / Self Care | Payer: BC Managed Care – PPO | Attending: Internal Medicine | Admitting: Internal Medicine

## 2016-03-18 ENCOUNTER — Emergency Department (HOSPITAL_COMMUNITY): Payer: BC Managed Care – PPO

## 2016-03-18 ENCOUNTER — Encounter (HOSPITAL_COMMUNITY): Payer: Self-pay | Admitting: Emergency Medicine

## 2016-03-18 DIAGNOSIS — Z89511 Acquired absence of right leg below knee: Secondary | ICD-10-CM | POA: Insufficient documentation

## 2016-03-18 DIAGNOSIS — E1165 Type 2 diabetes mellitus with hyperglycemia: Secondary | ICD-10-CM | POA: Diagnosis not present

## 2016-03-18 DIAGNOSIS — N2581 Secondary hyperparathyroidism of renal origin: Secondary | ICD-10-CM | POA: Diagnosis not present

## 2016-03-18 DIAGNOSIS — E1151 Type 2 diabetes mellitus with diabetic peripheral angiopathy without gangrene: Secondary | ICD-10-CM | POA: Diagnosis not present

## 2016-03-18 DIAGNOSIS — E1142 Type 2 diabetes mellitus with diabetic polyneuropathy: Secondary | ICD-10-CM | POA: Diagnosis not present

## 2016-03-18 DIAGNOSIS — N186 End stage renal disease: Secondary | ICD-10-CM | POA: Insufficient documentation

## 2016-03-18 DIAGNOSIS — K210 Gastro-esophageal reflux disease with esophagitis: Secondary | ICD-10-CM | POA: Diagnosis not present

## 2016-03-18 DIAGNOSIS — I120 Hypertensive chronic kidney disease with stage 5 chronic kidney disease or end stage renal disease: Secondary | ICD-10-CM | POA: Diagnosis not present

## 2016-03-18 DIAGNOSIS — K222 Esophageal obstruction: Secondary | ICD-10-CM | POA: Insufficient documentation

## 2016-03-18 DIAGNOSIS — R112 Nausea with vomiting, unspecified: Secondary | ICD-10-CM | POA: Diagnosis present

## 2016-03-18 DIAGNOSIS — E1122 Type 2 diabetes mellitus with diabetic chronic kidney disease: Secondary | ICD-10-CM | POA: Diagnosis not present

## 2016-03-18 DIAGNOSIS — Z89512 Acquired absence of left leg below knee: Secondary | ICD-10-CM | POA: Diagnosis not present

## 2016-03-18 DIAGNOSIS — Z7982 Long term (current) use of aspirin: Secondary | ICD-10-CM | POA: Insufficient documentation

## 2016-03-18 DIAGNOSIS — R1310 Dysphagia, unspecified: Secondary | ICD-10-CM | POA: Diagnosis not present

## 2016-03-18 DIAGNOSIS — K224 Dyskinesia of esophagus: Secondary | ICD-10-CM | POA: Diagnosis not present

## 2016-03-18 DIAGNOSIS — Z992 Dependence on renal dialysis: Secondary | ICD-10-CM | POA: Insufficient documentation

## 2016-03-18 DIAGNOSIS — E876 Hypokalemia: Secondary | ICD-10-CM | POA: Insufficient documentation

## 2016-03-18 DIAGNOSIS — E785 Hyperlipidemia, unspecified: Secondary | ICD-10-CM | POA: Insufficient documentation

## 2016-03-18 DIAGNOSIS — Z89519 Acquired absence of unspecified leg below knee: Secondary | ICD-10-CM

## 2016-03-18 LAB — COMPREHENSIVE METABOLIC PANEL
ALT: 7 U/L — ABNORMAL LOW (ref 14–54)
AST: 14 U/L — ABNORMAL LOW (ref 15–41)
Albumin: 3.7 g/dL (ref 3.5–5.0)
Alkaline Phosphatase: 61 U/L (ref 38–126)
Anion gap: 9 (ref 5–15)
BUN: 6 mg/dL (ref 6–20)
CO2: 33 mmol/L — ABNORMAL HIGH (ref 22–32)
Calcium: 9.2 mg/dL (ref 8.9–10.3)
Chloride: 96 mmol/L — ABNORMAL LOW (ref 101–111)
Creatinine, Ser: 2.33 mg/dL — ABNORMAL HIGH (ref 0.44–1.00)
GFR calc Af Amer: 24 mL/min — ABNORMAL LOW (ref >60)
GFR calc non Af Amer: 21 mL/min — ABNORMAL LOW (ref >60)
Glucose, Bld: 157 mg/dL — ABNORMAL HIGH (ref 65–99)
Potassium: 2.5 mmol/L — CL (ref 3.5–5.1)
Sodium: 138 mmol/L (ref 135–145)
Total Bilirubin: 0.3 mg/dL (ref 0.3–1.2)
Total Protein: 7.3 g/dL (ref 6.5–8.1)

## 2016-03-18 LAB — LIPASE, BLOOD: Lipase: 17 U/L (ref 11–51)

## 2016-03-18 LAB — CBC
HCT: 34 % — ABNORMAL LOW (ref 36.0–46.0)
Hemoglobin: 11 g/dL — ABNORMAL LOW (ref 12.0–15.0)
MCH: 28.4 pg (ref 26.0–34.0)
MCHC: 32.4 g/dL (ref 30.0–36.0)
MCV: 87.6 fL (ref 78.0–100.0)
Platelets: 217 10*3/uL (ref 150–400)
RBC: 3.88 MIL/uL (ref 3.87–5.11)
RDW: 16.8 % — ABNORMAL HIGH (ref 11.5–15.5)
WBC: 5.4 10*3/uL (ref 4.0–10.5)

## 2016-03-18 LAB — MAGNESIUM: Magnesium: 2 mg/dL (ref 1.7–2.4)

## 2016-03-18 MED ORDER — SENNA 8.6 MG PO TABS
2.0000 | ORAL_TABLET | Freq: Every day | ORAL | Status: DC | PRN
  Filled 2016-03-18: qty 2

## 2016-03-18 MED ORDER — ONDANSETRON HCL 4 MG/2ML IJ SOLN
4.0000 mg | Freq: Once | INTRAMUSCULAR | Status: AC
  Administered 2016-03-18: 4 mg via INTRAVENOUS
  Filled 2016-03-18: qty 2

## 2016-03-18 MED ORDER — AMLODIPINE BESYLATE 10 MG PO TABS
10.0000 mg | ORAL_TABLET | Freq: Every day | ORAL | Status: DC
  Filled 2016-03-18 (×2): qty 1

## 2016-03-18 MED ORDER — GABAPENTIN 100 MG PO CAPS
100.0000 mg | ORAL_CAPSULE | Freq: Two times a day (BID) | ORAL | Status: DC
  Administered 2016-03-19 – 2016-03-20 (×2): 100 mg via ORAL
  Filled 2016-03-18 (×4): qty 1

## 2016-03-18 MED ORDER — POTASSIUM CHLORIDE 10 MEQ/100ML IV SOLN
10.0000 meq | Freq: Once | INTRAVENOUS | Status: AC
  Administered 2016-03-18: 10 meq via INTRAVENOUS
  Filled 2016-03-18: qty 100

## 2016-03-18 MED ORDER — ACETAMINOPHEN 325 MG PO TABS: 650.0000 mg | ORAL_TABLET | Freq: Four times a day (QID) | ORAL | Status: DC | PRN

## 2016-03-18 MED ORDER — ALBUTEROL SULFATE (2.5 MG/3ML) 0.083% IN NEBU: 2.5000 mg | INHALATION_SOLUTION | RESPIRATORY_TRACT | Status: DC | PRN

## 2016-03-18 MED ORDER — HEPARIN SODIUM (PORCINE) 5000 UNIT/ML IJ SOLN
5000.0000 [IU] | Freq: Three times a day (TID) | INTRAMUSCULAR | Status: DC
  Administered 2016-03-19 (×3): 5000 [IU] via SUBCUTANEOUS
  Filled 2016-03-18 (×3): qty 1

## 2016-03-18 MED ORDER — INSULIN ASPART 100 UNIT/ML ~~LOC~~ SOLN: 0.0000 [IU] | Freq: Every day | SUBCUTANEOUS | Status: DC

## 2016-03-18 MED ORDER — TRAZODONE HCL 50 MG PO TABS
25.0000 mg | ORAL_TABLET | Freq: Every evening | ORAL | Status: DC | PRN
  Filled 2016-03-18: qty 1

## 2016-03-18 MED ORDER — SIMVASTATIN 20 MG PO TABS
20.0000 mg | ORAL_TABLET | Freq: Every day | ORAL | Status: DC
  Administered 2016-03-20: 20 mg via ORAL
  Filled 2016-03-18: qty 1

## 2016-03-18 MED ORDER — POTASSIUM CHLORIDE CRYS ER 20 MEQ PO TBCR
60.0000 meq | EXTENDED_RELEASE_TABLET | ORAL | Status: AC
  Administered 2016-03-18: 60 meq via ORAL
  Filled 2016-03-18: qty 3

## 2016-03-18 MED ORDER — PHENOL 1.4 % MT LIQD: 1.0000 | OROMUCOSAL | Status: DC | PRN

## 2016-03-18 MED ORDER — GI COCKTAIL ~~LOC~~
30.0000 mL | Freq: Once | ORAL | Status: AC
  Administered 2016-03-18: 30 mL via ORAL
  Filled 2016-03-18: qty 30

## 2016-03-18 MED ORDER — INSULIN ASPART 100 UNIT/ML ~~LOC~~ SOLN
0.0000 [IU] | Freq: Three times a day (TID) | SUBCUTANEOUS | Status: DC
  Administered 2016-03-19: 5 [IU] via SUBCUTANEOUS
  Administered 2016-03-19: 2 [IU] via SUBCUTANEOUS
  Administered 2016-03-20: 1 [IU] via SUBCUTANEOUS

## 2016-03-18 MED ORDER — ONDANSETRON HCL 4 MG/2ML IJ SOLN: 4.0000 mg | Freq: Four times a day (QID) | INTRAMUSCULAR | Status: DC | PRN

## 2016-03-18 MED ORDER — AMLODIPINE BESYLATE 5 MG PO TABS
10.0000 mg | ORAL_TABLET | Freq: Once | ORAL | Status: AC
  Administered 2016-03-18: 10 mg via ORAL
  Filled 2016-03-18: qty 2

## 2016-03-18 MED ORDER — FAMOTIDINE 20 MG PO TABS
10.0000 mg | ORAL_TABLET | Freq: Every day | ORAL | Status: DC
  Filled 2016-03-18: qty 1

## 2016-03-18 MED ORDER — SEVELAMER CARBONATE 800 MG PO TABS
1600.0000 mg | ORAL_TABLET | Freq: Three times a day (TID) | ORAL | Status: DC
  Administered 2016-03-20: 1600 mg via ORAL
  Filled 2016-03-18 (×3): qty 2

## 2016-03-18 MED ORDER — ACETAMINOPHEN 650 MG RE SUPP: 650.0000 mg | Freq: Four times a day (QID) | RECTAL | Status: DC | PRN

## 2016-03-18 MED ORDER — ONDANSETRON HCL 4 MG PO TABS: 4.0000 mg | ORAL_TABLET | Freq: Four times a day (QID) | ORAL | Status: DC | PRN

## 2016-03-18 MED ORDER — ASPIRIN EC 81 MG PO TBEC
81.0000 mg | DELAYED_RELEASE_TABLET | Freq: Every day | ORAL | Status: DC
  Filled 2016-03-18 (×2): qty 1

## 2016-03-18 MED ORDER — SODIUM CHLORIDE 0.9 % IV BOLUS (SEPSIS): 1000.0000 mL | Freq: Once | INTRAVENOUS | Status: DC

## 2016-03-18 MED ORDER — FUROSEMIDE 20 MG PO TABS
20.0000 mg | ORAL_TABLET | Freq: Every day | ORAL | Status: DC
  Administered 2016-03-20: 20 mg via ORAL
  Filled 2016-03-18 (×2): qty 1

## 2016-03-18 NOTE — ED Notes (Signed)
Pt sts she feels like something is stuck in her throat and she is having severe pain in her throat because of it. Pt also reports emesis x 2 today. Pt had dialysis today- finished treatment. Pt sts "I can eat sometimes, then I throw up sometimes." Pt sts increase in pain today.

## 2016-03-18 NOTE — ED Notes (Signed)
Admitting MD at bedside.

## 2016-03-18 NOTE — H&P (Signed)
History and Physical    ANALIS YELINEK [id number] DOB: 1951/03/18 DOA: 03/18/2016  Referring MD/NP/PA: Dr. Geronimo Boot PCP: Cathlean Cower, MD  Patient coming from:   Chief Complaint: Nausea and vomiting  HPI: Carolyn Valenzuela is a 65 y.o. female with medical history significant of ESRD on HD(M/W/F), DM type 2 with peripheral neuropathy, anemia, HTN, PVD; who presents with complaints of nausea and vomiting. Symptoms have what seems like intermittently occurred about for last 2-3 months. Patient notes that she had a regularly scheduled hemodialysis session without any issues. Later this afternoon she home and around 4 PM had 2 episodes of nausea and vomiting. She complains of having a sensation like something gets stuck in her throat after eating any food or drink. Symptoms have been more so with solid foods and liquids. Denies having any medications to try and relieve symptoms of nausea and vomiting. Patient was seen by her primary care provider 6 days ago which she was evaluated and recommended continue Pepcid, increased on her left side to 10 mg after hemoglobin A1c was elevated at 8.3, and referred to gastroenterology. Patient was scheduled for a barium esophagram at [facility] on 7/21. Thereafter, she was to be set up to have a EGD and colonoscopy as her last colonoscopy had been performed back in 2007. Patient notes no knowledge of this and it appears they had left multiple messages, but patient does not have an answering machine at home and did not know that anybody call from the office. Currently, for her diabetes she only checks her blood sugars once daily in the mornings. Family members present in the room state that she does not have a very good diet and eats very sporadically.   ED Course: On admission patient was evaluated and seen to be afebrile with blood pressure noted to be as high as 206/70. Lab work revealed WBC 5.4, hemoglobin 11, platelets 217, sodium 138, potassium  2.5, chloride 96, CO2 33, BUN 6, creatinine 2.33, glucose 157, magnesium 2, and calcium 9.2. Patient was given GI cocktail and Zofran with improvement of symptoms. Given 20 mg of potassium chloride in the ED. TRH called to admit.  Review of Systems: As per HPI otherwise 10 point review of systems negative.   Past Medical History  Diagnosis Date  . DIABETES MELLITUS, UNCONTROLLED 05/21/2009  . HYPERLIPIDEMIA 03/30/2007  . ANEMIA-IRON DEFICIENCY 01/25/2008  . HYPERTENSION 01/25/2008  . SINUSITIS- ACUTE-NOS 01/25/2008  . SECONDARY HYPERPARATHYROIDISM 05/21/2009  . CERVICAL RADICULOPATHY, LEFT 01/25/2008  . BACK PAIN 05/02/2009  . NUMBNESS 05/21/2009  . Allergic rhinitis, cause unspecified 12/08/2010  . Foot ulcer ([place])     right lateral malleolus  . Critical lower limb ischemia   . RENAL INSUFFICIENCY 02/01/2008    HD pt.    Past Surgical History  Procedure Laterality Date  . Av fistula placement Left   . Eye surgery Bilateral     cataracts removed, left eye still has some oil in it.  . Colonoscopy    . Amputation Right 03/15/2014    Procedure: RIGHT  LEG  BELOW KNEE AMPUTATION ;  Surgeon: [name], MD;  Location: [place];  Service: Orthopedics;  Laterality: Right;  . Insertion of dialysis catheter N/A 09/03/2014    Procedure: INSERTION OF DIALYSIS CATHETER RIGHT INTERNAL JUGULAR VEIN;  Surgeon: Conrad [place], MD;  Location: [place];  Service: Vascular;  Laterality: N/A;  . Av fistula placement Left 09/06/2014    Procedure: ARTERIOVENOUS (AV) FISTULA CREATION-Left Brachiocephalic;  Surgeon: Conrad Frederica, MD;  Location: [place];  Service: Vascular;  Laterality: Left;  . Peripheral vascular catheterization N/A 08/13/2015    Procedure: Abdominal Aortogram;  Surgeon: Elam Dutch, MD;  Location: [place] [facility];  Service: Cardiovascular;  Laterality: N/A;  . Amputation Left 11/29/2015    Procedure: AMPUTATION BELOW KNEE;  Surgeon: Newt Minion, MD;  Location: [place];  Service: Orthopedics;  Laterality:  Left;     reports that she has never smoked. She has never used smokeless tobacco. She reports that she does not drink alcohol or use illicit drugs.  Allergies  Allergen Reactions  . Oxycodone Itching    Family History  Problem Relation Age of Onset  . Cancer Mother     Pancreatic    Prior to Admission medications   Medication Sig Start Date End Date Taking? Authorizing Provider  acetaminophen (TYLENOL) 325 MG tablet Take 2 tablets (650 mg total) by mouth every 6 (six) hours as needed for mild pain, moderate pain or fever. 09/13/14  Yes Modena Jansky, MD  amLODipine (NORVASC) 10 MG tablet Take 1 tablet (10 mg total) by mouth daily. 12/31/15  Yes Biagio Borg, MD  aspirin 81 MG EC tablet Take 81 mg by mouth daily.     Yes Historical Provider, MD  [employer] ACID REDUCER 10 MG tablet TAKE 1 TABLET (10 MG TOTAL) BY MOUTH DAILY. 03/13/16  Yes Ivan Anchors Love, PA-C  furosemide (LASIX) 40 MG tablet TAKE 2 TABLETS BY MOUTH EVERY MORNING AND TAKE 1 TABLET EVERY EVENING 01/16/16  Yes Biagio Borg, MD  gabapentin (NEURONTIN) 100 MG capsule Take 1 capsule by mouth 2 (two) times daily. 10/29/15  Yes Historical Provider, MD  glipiZIDE (GLUCOTROL XL) 10 MG 24 hr tablet Take 1 tablet (10 mg total) by mouth daily with breakfast. 03/12/16  Yes Biagio Borg, MD  glucose blood (ONETOUCH VERIO) test strip 1 each by Other route 2 (two) times daily. Use to check blood sugars twice a day Dx E11.9 01/09/16  Yes Biagio Borg, MD  [facility] DELICA LANCETS FINE MISC Use to help check blood sugars twice a day Dx e11.9 01/09/16  Yes Biagio Borg, MD  oxyCODONE-acetaminophen (PERCOCET/ROXICET) 5-325 MG tablet Take 1 tablet by mouth every 6 (six) hours as needed for severe pain.   Yes Newt Minion, MD  senna (SENOKOT) 8.6 MG TABS tablet Take 2 tablets (17.2 mg total) by mouth 2 (two) times daily before a meal. Patient taking differently: Take 2 tablets by mouth daily as needed for mild constipation.  12/20/15  Yes Ivan Anchors Love,  PA-C  sevelamer carbonate (RENVELA) 800 MG tablet Take 1,600 mg by mouth 3 (three) times daily with meals.   Yes Historical Provider, MD  simvastatin (ZOCOR) 20 MG tablet Take 1 tablet (20 mg total) by mouth daily at 6 PM. 05/28/15  Yes Biagio Borg, MD  traZODone (DESYREL) 50 MG tablet TAKE 0.5-1 TABLETS (25-50 MG TOTAL) BY MOUTH AT BEDTIME AS NEEDED FOR SLEEP. 02/18/16  Yes Ankit Lorie Phenix, MD    Physical Exam:    Constitutional: elderly female in NAD, calm, comfortable Filed Vitals:   03/18/16 2000 03/18/16 2030 03/18/16 2130 03/18/16 2145  BP: 195/75 189/62 199/93 161/103  Pulse: 80 75 77 75  Temp:      TempSrc:      Resp: 13 20 10 14   SpO2: 100% 98% 98% 96%   Eyes: PERRL, lids and conjunctivae normal ENMT:  Mucous membranes are moist. Posterior pharynx clear of any exudate or lesions.Normal dentition.  Neck: normal, supple, no masses, no thyromegaly Respiratory: clear to auscultation bilaterally, no wheezing, no crackles. Normal respiratory effort. No accessory muscle use.  Cardiovascular: Regular rate and rhythm, no murmurs / rubs / gallops. No extremity edema. 2+ pedal pulses. No carotid bruits.  Abdomen: no tenderness, no masses palpated. No hepatosplenomegaly. Bowel sounds positive.  Musculoskeletal: no clubbing / cyanosis. Bilateral BKA Skin: no rashes, lesions, ulcers. No induration Neurologic: CN 2-12 grossly intact. Sensation intact, DTR normal. Strength 5/5 in all 4.  Psychiatric: Normal judgment and insight. Alert and oriented x 3. Normal mood.     Labs on Admission: I have personally reviewed following labs and imaging studies  CBC:  Recent Labs Lab 03/18/16 1825  WBC 5.4  HGB 11.0*  HCT 34.0*  MCV 87.6  PLT [id number]   Basic Metabolic Panel:  Recent Labs Lab 03/18/16 1825  NA 138  K 2.5*  CL 96*  CO2 33*  GLUCOSE 157*  BUN 6  CREATININE 2.33*  CALCIUM 9.2  MG 2.0   GFR: Estimated Creatinine Clearance: 16.9 mL/min (by C-G formula based on Cr of  2.33). Liver Function Tests:  Recent Labs Lab 03/18/16 1825  AST 14*  ALT 7*  ALKPHOS 61  BILITOT 0.3  PROT 7.3  ALBUMIN 3.7    Recent Labs Lab 03/18/16 1825  LIPASE 17   No results for input(s): AMMONIA in the last 168 hours. Coagulation Profile: No results for input(s): INR, PROTIME in the last 168 hours. Cardiac Enzymes: No results for input(s): CKTOTAL, CKMB, CKMBINDEX, TROPONINI in the last 168 hours. BNP (last 3 results) No results for input(s): PROBNP in the last 8760 hours. HbA1C: No results for input(s): HGBA1C in the last 72 hours. CBG: No results for input(s): GLUCAP in the last 168 hours. Lipid Profile: No results for input(s): CHOL, HDL, LDLCALC, TRIG, CHOLHDL, LDLDIRECT in the last 72 hours. Thyroid Function Tests: No results for input(s): TSH, T4TOTAL, FREET4, T3FREE, THYROIDAB in the last 72 hours. Anemia Panel: No results for input(s): VITAMINB12, FOLATE, FERRITIN, TIBC, IRON, RETICCTPCT in the last 72 hours. Urine analysis:    Component Value Date/Time   COLORURINE YELLOW 12/11/2015 1506   APPEARANCEUR HAZY* 12/11/2015 1506   LABSPEC 1.014 12/11/2015 1506   PHURINE 7.5 12/11/2015 1506   GLUCOSEU 100* 12/11/2015 1506   GLUCOSEU >=1000* 05/28/2015 1245   HGBUR SMALL* 12/11/2015 1506   BILIRUBINUR NEGATIVE 12/11/2015 1506   KETONESUR NEGATIVE 12/11/2015 1506   PROTEINUR >300* 12/11/2015 1506   UROBILINOGEN 0.2 05/28/2015 1245   NITRITE NEGATIVE 12/11/2015 1506   LEUKOCYTESUR SMALL* 12/11/2015 1506   Sepsis Labs: No results found for this or any previous visit (from the past 240 hour(s)).   Radiological Exams on Admission: No results found.  EKG: Independently reviewed. Sinus rhythm with abnormal R-wave progression but similar to previous tracings Assessment/Plan Nausea and vomiting/ Dysphagia and Sore throat: Acute on chronic. Patient reports a globus sensation and symptoms of nausea and vomiting with solids > liquids. Symptoms usually occur  more frequently in relation to hemodialysis. She had been referred by primary care provider, but with patient having no home answering machine missing appointment calls. - Admit to a MedSurg bed  - Zofran prn N/V - Chloraseptic spray prn throat discomfort - Check barium esophagram in a.m. - May warrant formal consultation to gastroenterology in am as inpatient for EGD as follow-up as an outpatient has been difficult  Hypokalemia: Acute. Potassium 2.5 on admission likely secondary to repeated nausea and vomiting symptoms. Given 20 mEq of potassium chloride while in the ED. This will likely raise potassium level to 2.7. - Will try to give patient 60 mEq of potassium chloride by mouth  - Repeat BMP in a.m. and replace as needed - Likely warrants oral supplementation as on Lasix   Diabetes mellitus type II, uncontrolled with peripheral neuropathy:  Last hemoglobin A1c on 7/13 was 8.3 she was increased on her glipizide to 10 mg daily - Hypoglycemic protocols - Held her glipizide - Continue gabapentin - Check CBGs every before meals and at bedtime  Essential hypertension - Continue amlodipine  ESRD on HD: Patient normally has hemodialysis M/W/F. She completed her HD session today. Patient states that she still makes urine. And previously was seen have a urinary tract infection on previous admission. - Check urinalysis - Continue Lasix twice daily - If patient requires prolonged stay will need to consult nephrology for HD  GERD  - continue Pepcid   Hyperlipidemia - Continue Zocor  Status post bilateral BKA: Patient has prosthetics.  DVT prophylaxis: Heparin  Code Status: Full Family Communication: Discussed with patient and sister present at bedside overall plan Disposition Plan: Possible discharge in 1-2 days Consults called: None Admission status: Observation medsurge  Norval Morton MD [facility] Pager [phone]  If 7PM-7AM, please contact  night-coverage [email or web address] Password [email or web address]  03/18/2016, 10:16 PM

## 2016-03-18 NOTE — Telephone Encounter (Signed)
No answer.  Machine picked up and asked me for my "remote access code".  I will try and call back tomorrow.

## 2016-03-18 NOTE — ED Provider Notes (Signed)
CSN: [id number]     Arrival date & time 03/18/16  1802 History   First MD Initiated Contact with Patient 03/18/16 1957     Chief Complaint  Patient presents with  . Emesis  . Sore Throat   (Consider location/radiation/quality/duration/timing/severity/associated sxs/prior Treatment) Patient is a 65 y.o. female presenting with vomiting. The history is provided by the patient.  Emesis Severity:  Moderate Duration:  4 weeks Timing:  Constant Quality:  Stomach contents Able to tolerate:  Liquids Progression:  Unchanged Chronicity:  New Relieved by:  Nothing Associated symptoms: sore throat   Associated symptoms: no abdominal pain, no chills and no diarrhea     Past Medical History  Diagnosis Date  . DIABETES MELLITUS, UNCONTROLLED 05/21/2009  . HYPERLIPIDEMIA 03/30/2007  . ANEMIA-IRON DEFICIENCY 01/25/2008  . HYPERTENSION 01/25/2008  . SINUSITIS- ACUTE-NOS 01/25/2008  . SECONDARY HYPERPARATHYROIDISM 05/21/2009  . CERVICAL RADICULOPATHY, LEFT 01/25/2008  . BACK PAIN 05/02/2009  . NUMBNESS 05/21/2009  . Allergic rhinitis, cause unspecified 12/08/2010  . Foot ulcer ([place])     right lateral malleolus  . Critical lower limb ischemia   . RENAL INSUFFICIENCY 02/01/2008    HD pt.   Past Surgical History  Procedure Laterality Date  . Av fistula placement Left   . Eye surgery Bilateral     cataracts removed, left eye still has some oil in it.  . Colonoscopy    . Amputation Right 03/15/2014    Procedure: RIGHT  LEG  BELOW KNEE AMPUTATION ;  Surgeon: [name], MD;  Location: [place];  Service: Orthopedics;  Laterality: Right;  . Insertion of dialysis catheter N/A 09/03/2014    Procedure: INSERTION OF DIALYSIS CATHETER RIGHT INTERNAL JUGULAR VEIN;  Surgeon: Conrad [place], MD;  Location: [place];  Service: Vascular;  Laterality: N/A;  . Av fistula placement Left 09/06/2014    Procedure: ARTERIOVENOUS (AV) FISTULA CREATION-Left Brachiocephalic;  Surgeon: Conrad Destin, MD;  Location: [place];  Service:  Vascular;  Laterality: Left;  . Peripheral vascular catheterization N/A 08/13/2015    Procedure: Abdominal Aortogram;  Surgeon: Elam Dutch, MD;  Location: [place] [facility];  Service: Cardiovascular;  Laterality: N/A;  . Amputation Left 11/29/2015    Procedure: AMPUTATION BELOW KNEE;  Surgeon: Newt Minion, MD;  Location: [place];  Service: Orthopedics;  Laterality: Left;   Family History  Problem Relation Age of Onset  . Cancer Mother     Pancreatic   Social History  Substance Use Topics  . Smoking status: Never Smoker   . Smokeless tobacco: Never Used  . Alcohol Use: No   OB History    No data available     Review of Systems  Constitutional: Negative for fever and chills.  HENT: Positive for sore throat and trouble swallowing. Negative for congestion and voice change.   Eyes: Negative for pain.  Respiratory: Negative for cough and shortness of breath.   Cardiovascular: Negative for chest pain and palpitations.  Gastrointestinal: Positive for nausea and vomiting. Negative for abdominal pain and diarrhea.  Genitourinary: Negative for dysuria and flank pain.  Musculoskeletal: Negative for back pain and neck pain.  Skin: Negative for rash.  Allergic/Immunologic: Negative.   Neurological: Negative for dizziness and light-headedness.  Psychiatric/Behavioral: Negative for confusion.      Allergies  Oxycodone  Home Medications   Prior to Admission medications   Medication Sig Start Date End Date Taking? Authorizing Provider  acetaminophen (TYLENOL) 325 MG tablet Take 2 tablets (650 mg total) by  mouth every 6 (six) hours as needed for mild pain, moderate pain or fever. 09/13/14  Yes Modena Jansky, MD  amLODipine (NORVASC) 10 MG tablet Take 1 tablet (10 mg total) by mouth daily. 12/31/15  Yes Biagio Borg, MD  aspirin 81 MG EC tablet Take 81 mg by mouth daily.     Yes Historical Provider, MD  [employer] ACID REDUCER 10 MG tablet TAKE 1 TABLET (10 MG TOTAL) BY MOUTH DAILY.  03/13/16  Yes Ivan Anchors Love, PA-C  furosemide (LASIX) 40 MG tablet TAKE 2 TABLETS BY MOUTH EVERY MORNING AND TAKE 1 TABLET EVERY EVENING 01/16/16  Yes Biagio Borg, MD  gabapentin (NEURONTIN) 100 MG capsule Take 1 capsule by mouth 2 (two) times daily. 10/29/15  Yes Historical Provider, MD  glipiZIDE (GLUCOTROL XL) 10 MG 24 hr tablet Take 1 tablet (10 mg total) by mouth daily with breakfast. 03/12/16  Yes Biagio Borg, MD  glucose blood (ONETOUCH VERIO) test strip 1 each by Other route 2 (two) times daily. Use to check blood sugars twice a day Dx E11.9 01/09/16  Yes Biagio Borg, MD  [facility] DELICA LANCETS FINE MISC Use to help check blood sugars twice a day Dx e11.9 01/09/16  Yes Biagio Borg, MD  oxyCODONE-acetaminophen (PERCOCET/ROXICET) 5-325 MG tablet Take 1 tablet by mouth every 6 (six) hours as needed for severe pain.   Yes Newt Minion, MD  senna (SENOKOT) 8.6 MG TABS tablet Take 2 tablets (17.2 mg total) by mouth 2 (two) times daily before a meal. Patient taking differently: Take 2 tablets by mouth daily as needed for mild constipation.  12/20/15  Yes Ivan Anchors Love, PA-C  sevelamer carbonate (RENVELA) 800 MG tablet Take 1,600 mg by mouth 3 (three) times daily with meals.   Yes Historical Provider, MD  simvastatin (ZOCOR) 20 MG tablet Take 1 tablet (20 mg total) by mouth daily at 6 PM. 05/28/15  Yes Biagio Borg, MD  traZODone (DESYREL) 50 MG tablet TAKE 0.5-1 TABLETS (25-50 MG TOTAL) BY MOUTH AT BEDTIME AS NEEDED FOR SLEEP. 02/18/16  Yes Ankit Lorie Phenix, MD   BP 182/68 mmHg  Pulse 80  Temp(Src) 98.5 F (36.9 C) (Oral)  Resp 18  Wt 54.8 kg  SpO2 95% Physical Exam  Constitutional: She is oriented to person, place, and time. She appears well-developed and well-nourished. No distress.  HENT:  Head: Normocephalic and atraumatic.  Mouth/Throat: Uvula is midline, oropharynx is clear and moist and mucous membranes are normal.  No pain with movement of larynx  Eyes: Conjunctivae and EOM are  normal. Pupils are equal, round, and reactive to light.  Neck: Normal range of motion. Neck supple.  Cardiovascular: Normal rate, regular rhythm and normal heart sounds.   Pulmonary/Chest: Effort normal and breath sounds normal. No respiratory distress.  Abdominal: Soft. Bowel sounds are normal. There is tenderness in the epigastric area and left upper quadrant. There is no rigidity, no rebound, no guarding, no CVA tenderness, no tenderness at McBurney's point and negative Murphy's sign.  Musculoskeletal: Normal range of motion.       Arms: Bilateral LE amputations  Neurological: She is alert and oriented to person, place, and time. She has normal reflexes. No cranial nerve deficit.  Skin: Skin is warm and dry. She is not diaphoretic.  Psychiatric: She has a normal mood and affect.    ED Course  Procedures (including critical care time) Labs Review Labs Reviewed  COMPREHENSIVE METABOLIC PANEL - Abnormal; Notable  for the following:    Potassium 2.5 (*)    Chloride 96 (*)    CO2 33 (*)    Glucose, Bld 157 (*)    Creatinine, Ser 2.33 (*)    AST 14 (*)    ALT 7 (*)    GFR calc non Af Amer 21 (*)    GFR calc Af Amer 24 (*)    All other components within normal limits  CBC - Abnormal; Notable for the following:    Hemoglobin 11.0 (*)    HCT 34.0 (*)    RDW 16.8 (*)    All other components within normal limits  MRSA PCR SCREENING  LIPASE, BLOOD  MAGNESIUM  URINALYSIS, ROUTINE W REFLEX MICROSCOPIC (NOT AT [facility])  BASIC METABOLIC PANEL  CBC    Imaging Review Dg Abd Acute W/chest  03/18/2016  CLINICAL DATA:  Abdominal pain EXAM: DG ABDOMEN ACUTE W/ 1V CHEST COMPARISON:  CT scan July 09, 2011 and chest x-ray December 11, 2015 FINDINGS: The chest is unremarkable. No free air, portal venous gas, or pneumatosis. The bowel gas pattern is nonobstructive although there is a paucity of bowel gas limiting evaluation. Large calcifications in the pelvis are consistent with calcified fibroids. No  other acute abnormalities. IMPRESSION: Negative abdominal radiographs.  No acute cardiopulmonary disease. Electronically Signed   By: Dorise Bullion III M.D   On: 03/18/2016 23:29   I have personally reviewed and evaluated these images and lab results as part of my medical decision-making.   EKG Interpretation   Date/Time:  Wednesday March 18 2016 19:37:42 EDT Ventricular Rate:  80 PR Interval:    QRS Duration: 76 QT Interval:  334 QTC Calculation: 386 R Axis:   54 Text Interpretation:  Sinus rhythm Abnormal R-wave progression, early  transition Borderline repolarization abnormality Baseline wander in  lead(s) V1 No significant change since last tracing Confirmed by  Winfred Leeds  MD, SAM [phone]) on 03/18/2016 8:37:53 PM      MDM   Final diagnoses:  Hypokalemia    65 year old female with a history of ESRD on dialysis Monday Wednesday Friday presenting today for increasing nausea and vomiting postdialysis. She reports that she has had this for the last 4 weeks and is associated with difficulty with swallowing. She has follow-up with GI but unable to tolerate by mouth at this point.  On evaluation the patient was hypertensive to systolic of [id number] but in no acute distress with no findings of hypertensive emergency. Found to be hypokalemic to 2.5. Given potassium in the ED. Antiemetics given with some alleviation of nausea and home BP meds given. Due to ESRD on dialysis only given small amount of potassium in the ED. Recommended admission for further evaluation of her chronic dysphagia with nausea and vomiting and correction of her potassium with frequent repeat labs. Low suspicion for epiglottitis, PTA, retropharyngeal abscess.  Given GI cocktail with alleviation of her symptoms and likely esophagitis and gastritis. Doubt acute surgical abnormality is at this time. Do not feel CT abdomen warranted.  Labs were viewed by myself and incorporated into medical decision making.  Discussed pertinent  finding with patient or caregiver prior to admission with no further questions.  Pt care supervised by my attending Dr. Winfred Leeds.   Geronimo Boot, MD PGY-3 Emergency Medicine     Geronimo Boot, MD 03/19/16 [id number]  Orlie Dakin, MD 03/19/16 [phone]

## 2016-03-18 NOTE — ED Notes (Signed)
Reported critical K to Dr Lenna Sciara.

## 2016-03-18 NOTE — ED Notes (Signed)
Spoke with main lab, they will add on mag level.

## 2016-03-18 NOTE — ED Provider Notes (Signed)
Patient complains of sore throat with lower abdominal pain onset 1 month ago. She also vomits almost each time after dialysis. She last had hemodialysis today. She completed session. She denies nausea at present. And is presently hungry exam no distress HEENT exam edentulous otherwise normal neck supple no bruit lungs clear auscultation heart regular rate and rhythm bilateral lower extremities with BKAS left upper extremity with dialysis fistula with good thrill  Orlie Dakin, MD 03/18/16 2329

## 2016-03-19 ENCOUNTER — Observation Stay (HOSPITAL_COMMUNITY): Payer: BC Managed Care – PPO

## 2016-03-19 DIAGNOSIS — R1310 Dysphagia, unspecified: Secondary | ICD-10-CM

## 2016-03-19 DIAGNOSIS — N186 End stage renal disease: Secondary | ICD-10-CM

## 2016-03-19 DIAGNOSIS — R112 Nausea with vomiting, unspecified: Secondary | ICD-10-CM

## 2016-03-19 LAB — CBC
HCT: 31.9 % — ABNORMAL LOW (ref 36.0–46.0)
Hemoglobin: 10.2 g/dL — ABNORMAL LOW (ref 12.0–15.0)
MCH: 28.3 pg (ref 26.0–34.0)
MCHC: 32 g/dL (ref 30.0–36.0)
MCV: 88.6 fL (ref 78.0–100.0)
Platelets: 188 10*3/uL (ref 150–400)
RBC: 3.6 MIL/uL — ABNORMAL LOW (ref 3.87–5.11)
RDW: 16.8 % — ABNORMAL HIGH (ref 11.5–15.5)
WBC: 6 10*3/uL (ref 4.0–10.5)

## 2016-03-19 LAB — URINALYSIS, ROUTINE W REFLEX MICROSCOPIC
Bilirubin Urine: NEGATIVE
Glucose, UA: 250 mg/dL — AB
Ketones, ur: NEGATIVE mg/dL
Nitrite: NEGATIVE
Protein, ur: >300 mg/dL — AB
Specific Gravity, Urine: 1.007 (ref 1.005–1.030)
pH: 8 (ref 5.0–8.0)

## 2016-03-19 LAB — BASIC METABOLIC PANEL
Anion gap: 8 (ref 5–15)
BUN: 12 mg/dL (ref 6–20)
CO2: 31 mmol/L (ref 22–32)
Calcium: 8.9 mg/dL (ref 8.9–10.3)
Chloride: 98 mmol/L — ABNORMAL LOW (ref 101–111)
Creatinine, Ser: 3.47 mg/dL — ABNORMAL HIGH (ref 0.44–1.00)
GFR calc Af Amer: 15 mL/min — ABNORMAL LOW (ref >60)
GFR calc non Af Amer: 13 mL/min — ABNORMAL LOW (ref >60)
Glucose, Bld: 151 mg/dL — ABNORMAL HIGH (ref 65–99)
Potassium: 4.2 mmol/L (ref 3.5–5.1)
Sodium: 137 mmol/L (ref 135–145)

## 2016-03-19 LAB — MRSA PCR SCREENING: MRSA by PCR: NEGATIVE

## 2016-03-19 LAB — GLUCOSE, CAPILLARY
Glucose-Capillary: 142 mg/dL — ABNORMAL HIGH (ref 65–99)
Glucose-Capillary: 230 mg/dL — ABNORMAL HIGH (ref 65–99)
Glucose-Capillary: 160 mg/dL — ABNORMAL HIGH (ref 65–99)
Glucose-Capillary: 252 mg/dL — ABNORMAL HIGH (ref 65–99)
Glucose-Capillary: 77 mg/dL (ref 65–99)

## 2016-03-19 LAB — URINE MICROSCOPIC-ADD ON

## 2016-03-19 MED ORDER — HYDRALAZINE HCL 20 MG/ML IJ SOLN
10.0000 mg | INTRAMUSCULAR | Status: DC | PRN
  Administered 2016-03-19: 10 mg via INTRAVENOUS
  Filled 2016-03-19: qty 1

## 2016-03-19 MED ORDER — FUROSEMIDE 20 MG PO TABS
10.0000 mg | ORAL_TABLET | Freq: Every day | ORAL | Status: DC
  Administered 2016-03-20: 10 mg via ORAL
  Filled 2016-03-19: qty 1

## 2016-03-19 NOTE — Progress Notes (Signed)
Patient arrived on unit via stretcher with nurse tech. Patient alert and oriented x4. Patient oriented to room, staff and unit. Patient's sister and friend at the bedside. Patient placed on telemetry, [place] notified. Skin assessment completed with Enid Derry, RN, no skin issues noted. Patient IV clean, dry and intact. Patient denies pain. Safety Fall Prevention Plan discussed with patient. Orders have been reviewed and implemented. Will continue to monitor the patient. Call light has been placed within reach.  Nena Polio BSN, RN  Phone Number: [phone]

## 2016-03-19 NOTE — Consult Note (Signed)
Referring Provider: Dr. Doyle Askew Primary Care Physician:  Cathlean Cower, MD Primary Gastroenterologist:  Dr. Fuller Plan  Reason for Consultation:  Dysphagia, nausea/vomiting; abnormal esophagram   HPI: Carolyn Valenzuela is a 65 y.o. female with medical history significant of ESRD on HD (M/W/F), DM type 2 with peripheral neuropathy, anemia, HTN, PVD; who presents with complaints of nausea and vomiting. Symptoms have what seems like intermittently occurred about for last 2-3 months, but worsening. Patient notes that she had a regularly scheduled hemodialysis session without any issues yesterday. Later in the afternoon she had 2 episodes of nausea and vomiting. She complains of having a sensation like something gets stuck in her throat after eating any food. Symptoms have been more so with solid foods than liquids. Denies having any medications to try and relieve symptoms. Patient was seen by her primary care provider, Dr. Jenny Reichmann within the past week at which time she was evaluated and recommended continue Pepcid and referred her to gastroenterology. Patient was scheduled for a barium esophagram at [facility] on 7/21. Thereafter, she was to be set up to have a EGD and colonoscopy as her last colonoscopy had been performed back in 2007 by Fuller Plan at which time it was normal.  In the interim she felt worse as stated above so came to the ED.  ED Course: On admission patient was evaluated and seen to be afebrile with blood pressure noted to be as high as 206/70. Lab work revealed WBC 5.4, hemoglobin 11, platelets 217, sodium 138, potassium 2.5, chloride 96, CO2 33, BUN 6, creatinine 2.33, glucose 157, magnesium 2, and calcium 9.2. Patient was given GI cocktail and Zofran with improvement of symptoms. Given 20 mg of potassium chloride in the ED. [facility] called to admit.  She had an esophagram today that showed the following:  IMPRESSION: 1. Nonspecific esophageal motility disorder. 2. Distal esophageal stricture  appears smooth shouldered without marked mucosal irregularity. 13 mm barium tablet does not pass beyond this location. Upper endoscopy may prove helpful to further evaluate.  GI was then consulted.   Past Medical History  Diagnosis Date  . DIABETES MELLITUS, UNCONTROLLED 05/21/2009  . HYPERLIPIDEMIA 03/30/2007  . ANEMIA-IRON DEFICIENCY 01/25/2008  . HYPERTENSION 01/25/2008  . SINUSITIS- ACUTE-NOS 01/25/2008  . SECONDARY HYPERPARATHYROIDISM 05/21/2009  . CERVICAL RADICULOPATHY, LEFT 01/25/2008  . BACK PAIN 05/02/2009  . NUMBNESS 05/21/2009  . Allergic rhinitis, cause unspecified 12/08/2010  . Foot ulcer ([place])     right lateral malleolus  . Critical lower limb ischemia   . RENAL INSUFFICIENCY 02/01/2008    HD pt.    Past Surgical History  Procedure Laterality Date  . Av fistula placement Left   . Eye surgery Bilateral     cataracts removed, left eye still has some oil in it.  . Colonoscopy    . Amputation Right 03/15/2014    Procedure: RIGHT  LEG  BELOW KNEE AMPUTATION ;  Surgeon: [name], MD;  Location: [place];  Service: Orthopedics;  Laterality: Right;  . Insertion of dialysis catheter N/A 09/03/2014    Procedure: INSERTION OF DIALYSIS CATHETER RIGHT INTERNAL JUGULAR VEIN;  Surgeon: Conrad Redland, MD;  Location: [place];  Service: Vascular;  Laterality: N/A;  . Av fistula placement Left 09/06/2014    Procedure: ARTERIOVENOUS (AV) FISTULA CREATION-Left Brachiocephalic;  Surgeon: Conrad Valmy, MD;  Location: [place];  Service: Vascular;  Laterality: Left;  . Peripheral vascular catheterization N/A 08/13/2015    Procedure: Abdominal Aortogram;  Surgeon: Jessy Oto  Fields, MD;  Location: [facility];  Service: Cardiovascular;  Laterality: N/A;  . Amputation Left 11/29/2015    Procedure: AMPUTATION BELOW KNEE;  Surgeon: Newt Minion, MD;  Location: [place];  Service: Orthopedics;  Laterality: Left;    Prior to Admission medications   Medication Sig Start Date End Date Taking? Authorizing  Provider  acetaminophen (TYLENOL) 325 MG tablet Take 2 tablets (650 mg total) by mouth every 6 (six) hours as needed for mild pain, moderate pain or fever. 09/13/14  Yes Modena Jansky, MD  amLODipine (NORVASC) 10 MG tablet Take 1 tablet (10 mg total) by mouth daily. 12/31/15  Yes Biagio Borg, MD  aspirin 81 MG EC tablet Take 81 mg by mouth daily.     Yes Historical Provider, MD  [employer] ACID REDUCER 10 MG tablet TAKE 1 TABLET (10 MG TOTAL) BY MOUTH DAILY. 03/13/16  Yes Ivan Anchors Love, PA-C  furosemide (LASIX) 40 MG tablet TAKE 2 TABLETS BY MOUTH EVERY MORNING AND TAKE 1 TABLET EVERY EVENING 01/16/16  Yes Biagio Borg, MD  gabapentin (NEURONTIN) 100 MG capsule Take 1 capsule by mouth 2 (two) times daily. 10/29/15  Yes Historical Provider, MD  glipiZIDE (GLUCOTROL XL) 10 MG 24 hr tablet Take 1 tablet (10 mg total) by mouth daily with breakfast. 03/12/16  Yes Biagio Borg, MD  glucose blood (ONETOUCH VERIO) test strip 1 each by Other route 2 (two) times daily. Use to check blood sugars twice a day Dx E11.9 01/09/16  Yes Biagio Borg, MD  [facility] DELICA LANCETS FINE MISC Use to help check blood sugars twice a day Dx e11.9 01/09/16  Yes Biagio Borg, MD  oxyCODONE-acetaminophen (PERCOCET/ROXICET) 5-325 MG tablet Take 1 tablet by mouth every 6 (six) hours as needed for severe pain.   Yes Newt Minion, MD  senna (SENOKOT) 8.6 MG TABS tablet Take 2 tablets (17.2 mg total) by mouth 2 (two) times daily before a meal. Patient taking differently: Take 2 tablets by mouth daily as needed for mild constipation.  12/20/15  Yes Ivan Anchors Love, PA-C  sevelamer carbonate (RENVELA) 800 MG tablet Take 1,600 mg by mouth 3 (three) times daily with meals.   Yes Historical Provider, MD  simvastatin (ZOCOR) 20 MG tablet Take 1 tablet (20 mg total) by mouth daily at 6 PM. 05/28/15  Yes Biagio Borg, MD  traZODone (DESYREL) 50 MG tablet TAKE 0.5-1 TABLETS (25-50 MG TOTAL) BY MOUTH AT BEDTIME AS NEEDED FOR SLEEP. 02/18/16  Yes Ankit Lorie Phenix, MD    Current Facility-Administered Medications  Medication Dose Route Frequency Provider Last Rate Last Dose  . acetaminophen (TYLENOL) tablet 650 mg  650 mg Oral Q6H PRN Norval Morton, MD       Or  . acetaminophen (TYLENOL) suppository 650 mg  650 mg Rectal Q6H PRN Norval Morton, MD      . albuterol (PROVENTIL) (2.5 MG/3ML) 0.083% nebulizer solution 2.5 mg  2.5 mg Nebulization Q2H PRN Rondell A Tamala Julian, MD      . amLODipine (NORVASC) tablet 10 mg  10 mg Oral Daily Norval Morton, MD   10 mg at 03/19/16 1000  . aspirin EC tablet 81 mg  81 mg Oral Daily Rondell Charmayne Sheer, MD   81 mg at 03/19/16 1000  . famotidine (PEPCID) tablet 10 mg  10 mg Oral Daily Norval Morton, MD   10 mg at 03/19/16 1000  . furosemide (LASIX) tablet 10  mg  10 mg Oral Daily Rondell A Tamala Julian, MD      . furosemide (LASIX) tablet 20 mg  20 mg Oral Daily Rondell A Tamala Julian, MD      . gabapentin (NEURONTIN) capsule 100 mg  100 mg Oral BID Norval Morton, MD   100 mg at 03/19/16 0056  . heparin injection 5,000 Units  5,000 Units Subcutaneous Q8H Norval Morton, MD   5,000 Units at 03/19/16 0541  . hydrALAZINE (APRESOLINE) injection 10 mg  10 mg Intravenous Q4H PRN Norval Morton, MD   10 mg at 03/19/16 1050  . insulin aspart (novoLOG) injection 0-9 Units  0-9 Units Subcutaneous TID WC Norval Morton, MD   0 Units at 03/19/16 0800  . ondansetron (ZOFRAN) tablet 4 mg  4 mg Oral Q6H PRN Norval Morton, MD       Or  . ondansetron (ZOFRAN) injection 4 mg  4 mg Intravenous Q6H PRN Rondell A Tamala Julian, MD      . phenol (CHLORASEPTIC) mouth spray 1 spray  1 spray Mouth/Throat PRN Norval Morton, MD      . senna (SENOKOT) tablet 17.2 mg  2 tablet Oral Daily PRN Norval Morton, MD      . sevelamer carbonate (RENVELA) tablet 1,600 mg  1,600 mg Oral TID WC Rondell A Tamala Julian, MD   1,600 mg at 03/19/16 0800  . simvastatin (ZOCOR) tablet 20 mg  20 mg Oral q1800 Rondell A Tamala Julian, MD      . traZODone (DESYREL) tablet 25-50 mg  25-50 mg  Oral QHS PRN Norval Morton, MD        Allergies as of 03/18/2016 - Review Complete 03/18/2016  Allergen Reaction Noted  . Oxycodone Itching 12/08/2010    Family History  Problem Relation Age of Onset  . Cancer Mother     Pancreatic    Social History   Social History  . Marital Status: Single    Spouse Name: N/A  . Number of Children: N/A  . Years of Education: N/A   Occupational History  . [place] History Main Topics  . Smoking status: Never Smoker   . Smokeless tobacco: Never Used  . Alcohol Use: No  . Drug Use: No  . Sexual Activity: Not on file   Other Topics Concern  . Not on file   Social History Narrative    Review of Systems: Ten point ROS is O/W negative except as mentioned in HPI.  Physical Exam: Vital signs in last 24 hours: Temp:  [98.5 F (36.9 C)-98.9 F (37.2 C)] 98.8 F (37.1 C) (07/20 0900) Pulse Rate:  [75-88] 79 (07/20 0900) Resp:  [10-22] 18 (07/20 0900) BP: (161-206)/(62-103) 185/62 mmHg (07/20 0900) SpO2:  [95 %-100 %] 98 % (07/20 0900) Weight:  [120 lb 13 oz (54.8 kg)] 120 lb 13 oz (54.8 kg) (07/20 0024) Last BM Date: 03/18/16 General:  Alert, Well-developed, well-nourished, pleasant and cooperative in NAD Head:  Normocephalic and atraumatic. Eyes:  Sclera clear, no icterus.  Conjunctiva pink. Ears:  Normal auditory acuity. Mouth:  No deformity or lesions.  Edentulous.   Lungs:  Clear throughout to auscultation.  No wheezes, crackles, or rhonchi.  Heart:  Regular rate and rhythm; no murmurs, clicks, rubs, or gallops. Abdomen:  Soft, non-distended.  BS present.  Non-tender. Rectal:  Deferred  Msk:  Symmetrical without gross deformities.  B/L BKA's. Extremities:  Without clubbing or edema.  B/L BKA's. Neurologic:  Alert and oriented x 4;  grossly normal neurologically. Skin:  Intact without significant lesions or rashes. Psych:  Alert and cooperative. Normal mood and affect.  Intake/Output  from previous day: 07/19 0701 - 07/20 0700 In: 0  Out: 300 [Urine:300]  Lab Results:  Recent Labs  03/18/16 1825 03/19/16 0547  WBC 5.4 6.0  HGB 11.0* 10.2*  HCT 34.0* 31.9*  PLT 217 188   BMET  Recent Labs  03/18/16 1825 03/19/16 0547  NA 138 137  K 2.5* 4.2  CL 96* 98*  CO2 33* 31  GLUCOSE 157* 151*  BUN 6 12  CREATININE 2.33* 3.47*  CALCIUM 9.2 8.9   LFT  Recent Labs  03/18/16 1825  PROT 7.3  ALBUMIN 3.7  AST 14*  ALT 7*  ALKPHOS 61  BILITOT 0.3   Studies/Results: Dg Esophagus  03/19/2016  CLINICAL DATA:  Odynophagia. Hoarseness. Sensation of food getting caught in throat. EXAM: ESOPHOGRAM/BARIUM SWALLOW TECHNIQUE: Single contrast examination was performed using  thin barium. FLUOROSCOPY TIME:  Radiation Exposure Index (as provided by the fluoroscopic device): If the device does not provide the exposure index: Fluoroscopy Time:  1 minutes and 30 seconds. Number of Acquired Images: COMPARISON:  None. FINDINGS: DUe to issues within mobility in this patient, exam was performed in an LPO position relative to the fluoro table with the patient either at 0 or 45 degree head up positioning. There is no evidence for esophageal diverticulum or gross mucosal ulceration. There is disruption of primary peristalsis on nearly all swallows consistent with nonspecific esophageal motility disorder. Narrowing of the esophagus at the esophagogastric junction is evident. 13 mm barium tablet lodges at the distal esophagus despite repeated swallows of both thin barium and water. IMPRESSION: 1. Nonspecific esophageal motility disorder. 2. Distal esophageal stricture appears smooth shouldered without marked mucosal irregularity. 13 mm barium tablet does not pass beyond this location. Upper endoscopy may prove helpful to further evaluate. Electronically Signed   By: Misty Stanley M.D.   On: 03/19/2016 10:07   Dg Abd Acute W/chest  03/18/2016  CLINICAL DATA:  Abdominal pain EXAM: DG ABDOMEN  ACUTE W/ 1V CHEST COMPARISON:  CT scan July 09, 2011 and chest x-ray December 11, 2015 FINDINGS: The chest is unremarkable. No free air, portal venous gas, or pneumatosis. The bowel gas pattern is nonobstructive although there is a paucity of bowel gas limiting evaluation. Large calcifications in the pelvis are consistent with calcified fibroids. No other acute abnormalities. IMPRESSION: Negative abdominal radiographs.  No acute cardiopulmonary disease. Electronically Signed   By: Dorise Bullion III M.D   On: 03/18/2016 23:29   IMPRESSION:  -65 year old female with dysphagia for the past few months, progressively worsening.  Esophagram today showed stricture that did not allow the barium tablet to pass. -ESRD on HD MWF -B/L BKA's with prostheses -Due for screening colonoscopy.  PLAN: -Full liquid diet today then NPO after midnight for EGD with dilation in AM.   -Patient can have regular screening colonoscopy as outpatient.   ZEHR, JESSICA D.  03/19/2016, 11:13 AM  Pager number [phone]  ________________________________________________________________________  Velora Heckler GI MD note:  I personally examined the patient, reviewed the data and agree with the assessment and plan described above.  She's has trouble swallowing and abnormal esophagram.  Possibly GERD related as she has heartburn but has not been taking PPI.  Rather, she takes alkaseltzer and tums fairly regularly.  Will plan on EGD tomorrow.  Screening colonoscopy can be as an outpatient.  Owens Loffler, MD [facility] Gastroenterology Pager [phone]

## 2016-03-19 NOTE — Progress Notes (Signed)
Patient ID: Carolyn Valenzuela, female   DOB: August 09, 1951, 65 y.o.   MRN: [id number]    PROGRESS NOTE    KEMONI VLAHAKIS  [id number] DOB: 1951/02/25 DOA: 03/18/2016  PCP: Cathlean Cower, MD   Brief Narrative:  65 y.o. female with ESRD on HD(M/W/F), DM type 2 with peripheral neuropathy, anemia, HTN, PVD; who presented with nausea and vomiting, dysphagia to solids and occasionally to liquids.   Assessment & Plan:  Nausea and vomiting, dysphasia and intermittent benign aphasia to solids and liquids - Esophagogram with nonspecific esophageal motility disorder, distal esophageal stricture noted, barium tablet did not pass beyond this location - Upper endoscopy recommended for further evaluation - Gastroenterology team consulted - Keep patient nothing by mouth until seen by gastroenterologist  Hypokalemia, hypochloremia - In the setting of vomiting - Supplemented and within normal limits - BMP in the morning  Diabetes mellitus type II, uncontrolled with peripheral neuropathy, PVD, bilateral BKA - Last hemoglobin A1c on 7/13 was 8.3 - Continue gabapentin - Check CBGs every before meals and at bedtime  Accelerated hypertension - Continue amlodipine - Add hydralazine as needed for now  ESRD on HD - Dialyzes Monday Wednesday and Friday - Appreciate nephrology team following  GERD  - continue Pepcid   Hyperlipidemia - Continue Zocor  Status post bilateral BKA - Patient has prosthetics.  DVT prophylaxis: Heparin SQ Code Status: Full Family Communication: Patient and sister at bedside Disposition Plan: Home in 1-2 days  Consultants:   Gastroenterology  Nephrology for dialysis  Procedures:   Esophagogram 03/19/2016, results below  Antimicrobials:   None  Subjective: Denies chest pain or shortness of breath, persistent dysphagia.  Objective: Filed Vitals:   03/18/16 2230 03/19/16 0024 03/19/16 0528 03/19/16 0900  BP: 181/64 182/68 164/68 185/62  Pulse: 82 80 80 79   Temp:  98.5 F (36.9 C) 98.9 F (37.2 C) 98.8 F (37.1 C)  TempSrc:  Oral Oral Oral  Resp: 19 18 19 18   Weight:  54.8 kg (120 lb 13 oz)    SpO2: 97% 95% 98% 98%    Intake/Output Summary (Last 24 hours) at 03/19/16 1109 Last data filed at 03/19/16 0900  Gross per 24 hour  Intake      0 ml  Output    300 ml  Net   -300 ml   Filed Weights   03/19/16 0024  Weight: 54.8 kg (120 lb 13 oz)    Examination:  General exam: Appears calm and comfortable  Respiratory system: Respiratory effort normal. Cardiovascular system: S1 & S2 heard, RRR. No JVD, rubs, gallops or clicks.  Gastrointestinal system: Abdomen is nondistended, soft and nontender. No organomegaly or masses felt. Central nervous system: Alert and oriented. No focal neurological deficits.   Data Reviewed: I have personally reviewed following labs and imaging studies  CBC:  Recent Labs Lab 03/18/16 1825 03/19/16 0547  WBC 5.4 6.0  HGB 11.0* 10.2*  HCT 34.0* 31.9*  MCV 87.6 88.6  PLT 217 0000000   Basic Metabolic Panel:  Recent Labs Lab 03/18/16 1825 03/19/16 0547  NA 138 137  K 2.5* 4.2  CL 96* 98*  CO2 33* 31  GLUCOSE 157* 151*  BUN 6 12  CREATININE 2.33* 3.47*  CALCIUM 9.2 8.9  MG 2.0  --    Liver Function Tests:  Recent Labs Lab 03/18/16 1825  AST 14*  ALT 7*  ALKPHOS 61  BILITOT 0.3  PROT 7.3  ALBUMIN 3.7    Recent Labs Lab 03/18/16  1825  LIPASE 17   CBG:  Recent Labs Lab 03/19/16 0020 03/19/16 0737  GLUCAP 230* 142*   Urine analysis:    Component Value Date/Time   COLORURINE YELLOW 03/19/2016 0206   APPEARANCEUR CLOUDY* 03/19/2016 0206   LABSPEC 1.007 03/19/2016 0206   PHURINE 8.0 03/19/2016 0206   GLUCOSEU 250* 03/19/2016 0206   GLUCOSEU >=1000* 05/28/2015 1245   HGBUR SMALL* 03/19/2016 0206   BILIRUBINUR NEGATIVE 03/19/2016 0206   KETONESUR NEGATIVE 03/19/2016 0206   PROTEINUR >300* 03/19/2016 0206   UROBILINOGEN 0.2 05/28/2015 1245   NITRITE NEGATIVE 03/19/2016  0206   LEUKOCYTESUR TRACE* 03/19/2016 0206    Recent Results (from the past 240 hour(s))  MRSA PCR Screening     Status: None   Collection Time: 03/19/16  1:02 AM  Result Value Ref Range Status   MRSA by PCR NEGATIVE NEGATIVE Final    Comment:        The GeneXpert MRSA Assay (FDA approved for NASAL specimens only), is one component of a comprehensive MRSA colonization surveillance program. It is not intended to diagnose MRSA infection nor to guide or monitor treatment for MRSA infections.       Radiology Studies: Dg Esophagus 03/19/2016   Nonspecific esophageal motility disorder. 2. Distal esophageal stricture appears smooth shouldered without marked mucosal irregularity. 13 mm barium tablet does not pass beyond this location. Upper endoscopy may prove helpful to further evaluate.   Dg Abd Acute W/chest 03/18/2016  Negative abdominal radiographs.  No acute cardiopulmonary disease.      Scheduled Meds: . amLODipine  10 mg Oral Daily  . aspirin EC  81 mg Oral Daily  . famotidine  10 mg Oral Daily  . furosemide  10 mg Oral Daily  . furosemide  20 mg Oral Daily  . gabapentin  100 mg Oral BID  . heparin  5,000 Units Subcutaneous Q8H  . insulin aspart  0-9 Units Subcutaneous TID WC  . sevelamer carbonate  1,600 mg Oral TID WC  . simvastatin  20 mg Oral q1800   Continuous Infusions:  Time spent: 20 minutes   Faye Ramsay, MD [facility] Pager [phone]  If 7PM-7AM, please contact night-coverage [email or web address] Password [email or web address] 03/19/2016, 11:09 AM

## 2016-03-19 NOTE — Telephone Encounter (Signed)
Patient is admitted and has a GI consult in place.  I have cancelled the appt for BS

## 2016-03-20 ENCOUNTER — Encounter (HOSPITAL_COMMUNITY)
Admission: EM | Disposition: A | Discharge status: Home / Self Care | Payer: Self-pay | Source: Home / Self Care | Attending: Emergency Medicine

## 2016-03-20 ENCOUNTER — Ambulatory Visit (HOSPITAL_COMMUNITY): Payer: BC Managed Care – PPO

## 2016-03-20 ENCOUNTER — Encounter (HOSPITAL_COMMUNITY): Payer: Self-pay

## 2016-03-20 DIAGNOSIS — R1310 Dysphagia, unspecified: Secondary | ICD-10-CM | POA: Diagnosis not present

## 2016-03-20 DIAGNOSIS — R112 Nausea with vomiting, unspecified: Secondary | ICD-10-CM | POA: Diagnosis not present

## 2016-03-20 DIAGNOSIS — K2970 Gastritis, unspecified, without bleeding: Secondary | ICD-10-CM | POA: Diagnosis not present

## 2016-03-20 HISTORY — PX: ESOPHAGOGASTRODUODENOSCOPY: SHX5428

## 2016-03-20 LAB — GLUCOSE, CAPILLARY
Glucose-Capillary: 131 mg/dL — ABNORMAL HIGH (ref 65–99)
Glucose-Capillary: 123 mg/dL — ABNORMAL HIGH (ref 65–99)
Glucose-Capillary: 126 mg/dL — ABNORMAL HIGH (ref 65–99)

## 2016-03-20 LAB — CBC
HCT: 32.8 % — ABNORMAL LOW (ref 36.0–46.0)
Hemoglobin: 10.4 g/dL — ABNORMAL LOW (ref 12.0–15.0)
MCH: 28.3 pg (ref 26.0–34.0)
MCHC: 31.7 g/dL (ref 30.0–36.0)
MCV: 89.4 fL (ref 78.0–100.0)
Platelets: 206 10*3/uL (ref 150–400)
RBC: 3.67 MIL/uL — ABNORMAL LOW (ref 3.87–5.11)
RDW: 17.5 % — ABNORMAL HIGH (ref 11.5–15.5)
WBC: 6.5 10*3/uL (ref 4.0–10.5)

## 2016-03-20 LAB — RENAL FUNCTION PANEL
Albumin: 3.1 g/dL — ABNORMAL LOW (ref 3.5–5.0)
Anion gap: 9 (ref 5–15)
BUN: 22 mg/dL — ABNORMAL HIGH (ref 6–20)
CO2: 30 mmol/L (ref 22–32)
Calcium: 9.6 mg/dL (ref 8.9–10.3)
Chloride: 97 mmol/L — ABNORMAL LOW (ref 101–111)
Creatinine, Ser: 5.4 mg/dL — ABNORMAL HIGH (ref 0.44–1.00)
GFR calc Af Amer: 9 mL/min — ABNORMAL LOW (ref >60)
GFR calc non Af Amer: 8 mL/min — ABNORMAL LOW (ref >60)
Glucose, Bld: 111 mg/dL — ABNORMAL HIGH (ref 65–99)
Phosphorus: 4.7 mg/dL — ABNORMAL HIGH (ref 2.5–4.6)
Potassium: 5 mmol/L (ref 3.5–5.1)
Sodium: 136 mmol/L (ref 135–145)

## 2016-03-20 SURGERY — EGD (ESOPHAGOGASTRODUODENOSCOPY)
Anesthesia: Moderate Sedation

## 2016-03-20 MED ORDER — MIDAZOLAM HCL 10 MG/2ML IJ SOLN
INTRAMUSCULAR | Status: DC | PRN
  Administered 2016-03-20: 3 mg via INTRAVENOUS
  Administered 2016-03-20: 2 mg via INTRAVENOUS

## 2016-03-20 MED ORDER — SODIUM CHLORIDE 0.9 % IV SOLN
INTRAVENOUS | Status: DC
  Administered 2016-03-20: 500 mL via INTRAVENOUS

## 2016-03-20 MED ORDER — PANTOPRAZOLE SODIUM 40 MG PO TBEC: 40.0000 mg | DELAYED_RELEASE_TABLET | Freq: Two times a day (BID) | ORAL | Status: DC

## 2016-03-20 MED ORDER — DOXERCALCIFEROL 4 MCG/2ML IV SOLN
4.0000 ug | INTRAVENOUS | Status: DC
Start: 2016-03-20 — End: 2016-03-20
  Administered 2016-03-20: 4 ug via INTRAVENOUS

## 2016-03-20 MED ORDER — PANTOPRAZOLE SODIUM 40 MG PO TBEC
40.0000 mg | DELAYED_RELEASE_TABLET | Freq: Two times a day (BID) | ORAL | Status: DC
  Administered 2016-03-20: 40 mg via ORAL
  Filled 2016-03-20: qty 1

## 2016-03-20 MED ORDER — DIPHENHYDRAMINE HCL 50 MG/ML IJ SOLN
INTRAMUSCULAR | Status: AC
  Filled 2016-03-20: qty 1

## 2016-03-20 MED ORDER — DOXERCALCIFEROL 4 MCG/2ML IV SOLN
INTRAVENOUS | Status: AC
  Filled 2016-03-20: qty 2

## 2016-03-20 MED ORDER — MIDAZOLAM HCL 5 MG/ML IJ SOLN
INTRAMUSCULAR | Status: AC
  Filled 2016-03-20: qty 2

## 2016-03-20 MED ORDER — FENTANYL CITRATE (PF) 100 MCG/2ML IJ SOLN
INTRAMUSCULAR | Status: DC | PRN
  Administered 2016-03-20: 50 ug via INTRAVENOUS
  Administered 2016-03-20: 25 ug via INTRAVENOUS

## 2016-03-20 MED ORDER — FENTANYL CITRATE (PF) 100 MCG/2ML IJ SOLN
INTRAMUSCULAR | Status: AC
  Filled 2016-03-20: qty 2

## 2016-03-20 NOTE — H&P (View-Only) (Signed)
Referring Provider: Dr. Doyle Askew Primary Care Physician:  Cathlean Cower, MD Primary Gastroenterologist:  Dr. Fuller Plan  Reason for Consultation:  Dysphagia, nausea/vomiting; abnormal esophagram   HPI: Carolyn Valenzuela is a 65 y.o. female with medical history significant of ESRD on HD (M/W/F), DM type 2 with peripheral neuropathy, anemia, HTN, PVD; who presents with complaints of nausea and vomiting. Symptoms have what seems like intermittently occurred about for last 2-3 months, but worsening. Patient notes that she had a regularly scheduled hemodialysis session without any issues yesterday. Later in the afternoon she had 2 episodes of nausea and vomiting. She complains of having a sensation like something gets stuck in her throat after eating any food. Symptoms have been more so with solid foods than liquids. Denies having any medications to try and relieve symptoms. Patient was seen by her primary care provider, Dr. Jenny Reichmann within the past week at which time she was evaluated and recommended continue Pepcid and referred her to gastroenterology. Patient was scheduled for a barium esophagram at [facility] on 7/21. Thereafter, she was to be set up to have a EGD and colonoscopy as her last colonoscopy had been performed back in 2007 by Fuller Plan at which time it was normal.  In the interim she felt worse as stated above so came to the ED.  ED Course: On admission patient was evaluated and seen to be afebrile with blood pressure noted to be as high as 206/70. Lab work revealed WBC 5.4, hemoglobin 11, platelets 217, sodium 138, potassium 2.5, chloride 96, CO2 33, BUN 6, creatinine 2.33, glucose 157, magnesium 2, and calcium 9.2. Patient was given GI cocktail and Zofran with improvement of symptoms. Given 20 mg of potassium chloride in the ED. TR[facility] called to admit.  She had an esophagram today that showed the following:  IMPRESSION: 1. Nonspecific esophageal motility disorder. 2. Distal esophageal stricture  appears smooth shouldered without marked mucosal irregularity. 13 mm barium tablet does not pass beyond this location. Upper endoscopy may prove helpful to further evaluate.  GI was then consulted.   Past Medical History  Diagnosis Date  . DIABETES MELLITUS, UNCONTROLLED 05/21/2009  . HYPERLIPIDEMIA 03/30/2007  . ANEMIA-IRON DEFICIENCY 01/25/2008  . HYPERTENSION 01/25/2008  . SINUSITIS- ACUTE-NOS 01/25/2008  . SECONDARY HYPERPARATHYROIDISM 05/21/2009  . CERVICAL RADICULOPATHY, LEFT 01/25/2008  . BACK PAIN 05/02/2009  . NUMBNESS 05/21/2009  . Allergic rhinitis, cause unspecified 12/08/2010  . Foot ulcer ([place])     right lateral malleolus  . Critical lower limb ischemia   . RENAL INSUFFICIENCY 02/01/2008    HD pt.    Past Surgical History  Procedure Laterality Date  . Av fistula placement Left   . Eye surgery Bilateral     cataracts removed, left eye still has some oil in it.  . Colonoscopy    . Amputation Right 03/15/2014    Procedure: RIGHT  LEG  BELOW KNEE AMPUTATION ;  Surgeon: [name], MD;  Location: [place];  Service: Orthopedics;  Laterality: Right;  . Insertion of dialysis catheter N/A 09/03/2014    Procedure: INSERTION OF DIALYSIS CATHETER RIGHT INTERNAL JUGULAR VEIN;  Surgeon: Conrad Orleans, MD;  Location: [place];  Service: Vascular;  Laterality: N/A;  . Av fistula placement Left 09/06/2014    Procedure: ARTERIOVENOUS (AV) FISTULA CREATION-Left Brachiocephalic;  Surgeon: Conrad [place], MD;  Location: [place];  Service: Vascular;  Laterality: Left;  . Peripheral vascular catheterization N/A 08/13/2015    Procedure: Abdominal Aortogram;  Surgeon: Jessy Oto  Fields, MD;  Location: [place] [facility];  Service: Cardiovascular;  Laterality: N/A;  . Amputation Left 11/29/2015    Procedure: AMPUTATION BELOW KNEE;  Surgeon: Newt Minion, MD;  Location: [place];  Service: Orthopedics;  Laterality: Left;    Prior to Admission medications   Medication Sig Start Date End Date Taking? Authorizing  Provider  acetaminophen (TYLENOL) 325 MG tablet Take 2 tablets (650 mg total) by mouth every 6 (six) hours as needed for mild pain, moderate pain or fever. 09/13/14  Yes Modena Jansky, MD  amLODipine (NORVASC) 10 MG tablet Take 1 tablet (10 mg total) by mouth daily. 12/31/15  Yes Biagio Borg, MD  aspirin 81 MG EC tablet Take 81 mg by mouth daily.     Yes Historical Provider, MD  [employer] ACID REDUCER 10 MG tablet TAKE 1 TABLET (10 MG TOTAL) BY MOUTH DAILY. 03/13/16  Yes Ivan Anchors Love, PA-C  furosemide (LASIX) 40 MG tablet TAKE 2 TABLETS BY MOUTH EVERY MORNING AND TAKE 1 TABLET EVERY EVENING 01/16/16  Yes Biagio Borg, MD  gabapentin (NEURONTIN) 100 MG capsule Take 1 capsule by mouth 2 (two) times daily. 10/29/15  Yes Historical Provider, MD  glipiZIDE (GLUCOTROL XL) 10 MG 24 hr tablet Take 1 tablet (10 mg total) by mouth daily with breakfast. 03/12/16  Yes Biagio Borg, MD  glucose blood (ONETOUCH VERIO) test strip 1 each by Other route 2 (two) times daily. Use to check blood sugars twice a day Dx E11.9 01/09/16  Yes Biagio Borg, MD  [facility] DELICA LANCETS FINE MISC Use to help check blood sugars twice a day Dx e11.9 01/09/16  Yes Biagio Borg, MD  oxyCODONE-acetaminophen (PERCOCET/ROXICET) 5-325 MG tablet Take 1 tablet by mouth every 6 (six) hours as needed for severe pain.   Yes Newt Minion, MD  senna (SENOKOT) 8.6 MG TABS tablet Take 2 tablets (17.2 mg total) by mouth 2 (two) times daily before a meal. Patient taking differently: Take 2 tablets by mouth daily as needed for mild constipation.  12/20/15  Yes Ivan Anchors Love, PA-C  sevelamer carbonate (RENVELA) 800 MG tablet Take 1,600 mg by mouth 3 (three) times daily with meals.   Yes Historical Provider, MD  simvastatin (ZOCOR) 20 MG tablet Take 1 tablet (20 mg total) by mouth daily at 6 PM. 05/28/15  Yes Biagio Borg, MD  traZODone (DESYREL) 50 MG tablet TAKE 0.5-1 TABLETS (25-50 MG TOTAL) BY MOUTH AT BEDTIME AS NEEDED FOR SLEEP. 02/18/16  Yes Ankit Lorie Phenix, MD    Current Facility-Administered Medications  Medication Dose Route Frequency Provider Last Rate Last Dose  . acetaminophen (TYLENOL) tablet 650 mg  650 mg Oral Q6H PRN Norval Morton, MD       Or  . acetaminophen (TYLENOL) suppository 650 mg  650 mg Rectal Q6H PRN Norval Morton, MD      . albuterol (PROVENTIL) (2.5 MG/3ML) 0.083% nebulizer solution 2.5 mg  2.5 mg Nebulization Q2H PRN Rondell A Tamala Julian, MD      . amLODipine (NORVASC) tablet 10 mg  10 mg Oral Daily Norval Morton, MD   10 mg at 03/19/16 1000  . aspirin EC tablet 81 mg  81 mg Oral Daily Rondell Charmayne Sheer, MD   81 mg at 03/19/16 1000  . famotidine (PEPCID) tablet 10 mg  10 mg Oral Daily Norval Morton, MD   10 mg at 03/19/16 1000  . furosemide (LASIX) tablet 10  mg  10 mg Oral Daily Rondell A Tamala Julian, MD      . furosemide (LASIX) tablet 20 mg  20 mg Oral Daily Rondell A Tamala Julian, MD      . gabapentin (NEURONTIN) capsule 100 mg  100 mg Oral BID Norval Morton, MD   100 mg at 03/19/16 0056  . heparin injection 5,000 Units  5,000 Units Subcutaneous Q8H Norval Morton, MD   5,000 Units at 03/19/16 0541  . hydrALAZINE (APRESOLINE) injection 10 mg  10 mg Intravenous Q4H PRN Norval Morton, MD   10 mg at 03/19/16 1050  . insulin aspart (novoLOG) injection 0-9 Units  0-9 Units Subcutaneous TID WC Norval Morton, MD   0 Units at 03/19/16 0800  . ondansetron (ZOFRAN) tablet 4 mg  4 mg Oral Q6H PRN Norval Morton, MD       Or  . ondansetron (ZOFRAN) injection 4 mg  4 mg Intravenous Q6H PRN Rondell A Tamala Julian, MD      . phenol (CHLORASEPTIC) mouth spray 1 spray  1 spray Mouth/Throat PRN Norval Morton, MD      . senna (SENOKOT) tablet 17.2 mg  2 tablet Oral Daily PRN Norval Morton, MD      . sevelamer carbonate (RENVELA) tablet 1,600 mg  1,600 mg Oral TID WC Rondell A Tamala Julian, MD   1,600 mg at 03/19/16 0800  . simvastatin (ZOCOR) tablet 20 mg  20 mg Oral q1800 Rondell A Tamala Julian, MD      . traZODone (DESYREL) tablet 25-50 mg  25-50 mg  Oral QHS PRN Norval Morton, MD        Allergies as of 03/18/2016 - Review Complete 03/18/2016  Allergen Reaction Noted  . Oxycodone Itching 12/08/2010    Family History  Problem Relation Age of Onset  . Cancer Mother     Pancreatic    Social History   Social History  . Marital Status: Single    Spouse Name: N/A  . Number of Children: N/A  . Years of Education: N/A   Occupational History  . [place] History Main Topics  . Smoking status: Never Smoker   . Smokeless tobacco: Never Used  . Alcohol Use: No  . Drug Use: No  . Sexual Activity: Not on file   Other Topics Concern  . Not on file   Social History Narrative    Review of Systems: Ten point ROS is O/W negative except as mentioned in HPI.  Physical Exam: Vital signs in last 24 hours: Temp:  [98.5 F (36.9 C)-98.9 F (37.2 C)] 98.8 F (37.1 C) (07/20 0900) Pulse Rate:  [75-88] 79 (07/20 0900) Resp:  [10-22] 18 (07/20 0900) BP: (161-206)/(62-103) 185/62 mmHg (07/20 0900) SpO2:  [95 %-100 %] 98 % (07/20 0900) Weight:  [120 lb 13 oz (54.8 kg)] 120 lb 13 oz (54.8 kg) (07/20 0024) Last BM Date: 03/18/16 General:  Alert, Well-developed, well-nourished, pleasant and cooperative in NAD Head:  Normocephalic and atraumatic. Eyes:  Sclera clear, no icterus.  Conjunctiva pink. Ears:  Normal auditory acuity. Mouth:  No deformity or lesions.  Edentulous.   Lungs:  Clear throughout to auscultation.  No wheezes, crackles, or rhonchi.  Heart:  Regular rate and rhythm; no murmurs, clicks, rubs, or gallops. Abdomen:  Soft, non-distended.  BS present.  Non-tender. Rectal:  Deferred  Msk:  Symmetrical without gross deformities.  B/L BKA's. Extremities:  Without clubbing or edema.  B/L BKA's. Neurologic:  Alert and oriented x 4;  grossly normal neurologically. Skin:  Intact without significant lesions or rashes. Psych:  Alert and cooperative. Normal mood and affect.  Intake/Output  from previous day: 07/19 0701 - 07/20 0700 In: 0  Out: 300 [Urine:300]  Lab Results:  Recent Labs  03/18/16 1825 03/19/16 0547  WBC 5.4 6.0  HGB 11.0* 10.2*  HCT 34.0* 31.9*  PLT 217 188   BMET  Recent Labs  03/18/16 1825 03/19/16 0547  NA 138 137  K 2.5* 4.2  CL 96* 98*  CO2 33* 31  GLUCOSE 157* 151*  BUN 6 12  CREATININE 2.33* 3.47*  CALCIUM 9.2 8.9   LFT  Recent Labs  03/18/16 1825  PROT 7.3  ALBUMIN 3.7  AST 14*  ALT 7*  ALKPHOS 61  BILITOT 0.3   Studies/Results: Dg Esophagus  03/19/2016  CLINICAL DATA:  Odynophagia. Hoarseness. Sensation of food getting caught in throat. EXAM: ESOPHOGRAM/BARIUM SWALLOW TECHNIQUE: Single contrast examination was performed using  thin barium. FLUOROSCOPY TIME:  Radiation Exposure Index (as provided by the fluoroscopic device): If the device does not provide the exposure index: Fluoroscopy Time:  1 minutes and 30 seconds. Number of Acquired Images: COMPARISON:  None. FINDINGS: DUe to issues within mobility in this patient, exam was performed in an LPO position relative to the fluoro table with the patient either at 0 or 45 degree head up positioning. There is no evidence for esophageal diverticulum or gross mucosal ulceration. There is disruption of primary peristalsis on nearly all swallows consistent with nonspecific esophageal motility disorder. Narrowing of the esophagus at the esophagogastric junction is evident. 13 mm barium tablet lodges at the distal esophagus despite repeated swallows of both thin barium and water. IMPRESSION: 1. Nonspecific esophageal motility disorder. 2. Distal esophageal stricture appears smooth shouldered without marked mucosal irregularity. 13 mm barium tablet does not pass beyond this location. Upper endoscopy may prove helpful to further evaluate. Electronically Signed   By: Misty Stanley M.D.   On: 03/19/2016 10:07   Dg Abd Acute W/chest  03/18/2016  CLINICAL DATA:  Abdominal pain EXAM: DG ABDOMEN  ACUTE W/ 1V CHEST COMPARISON:  CT scan July 09, 2011 and chest x-ray December 11, 2015 FINDINGS: The chest is unremarkable. No free air, portal venous gas, or pneumatosis. The bowel gas pattern is nonobstructive although there is a paucity of bowel gas limiting evaluation. Large calcifications in the pelvis are consistent with calcified fibroids. No other acute abnormalities. IMPRESSION: Negative abdominal radiographs.  No acute cardiopulmonary disease. Electronically Signed   By: Dorise Bullion III M.D   On: 03/18/2016 23:29   IMPRESSION:  -65 year old female with dysphagia for the past few months, progressively worsening.  Esophagram today showed stricture that did not allow the barium tablet to pass. -ESRD on HD MWF -B/L BKA's with prostheses -Due for screening colonoscopy.  PLAN: -Full liquid diet today then NPO after midnight for EGD with dilation in AM.   -Patient can have regular screening colonoscopy as outpatient.   ZEHR, JESSICA D.  03/19/2016, 11:13 AM  Pager number [phone]  ________________________________________________________________________  Velora Heckler GI MD note:  I personally examined the patient, reviewed the data and agree with the assessment and plan described above.  She's has trouble swallowing and abnormal esophagram.  Possibly GERD related as she has heartburn but has not been taking PPI.  Rather, she takes alkaseltzer and tums fairly regularly.  Will plan on EGD tomorrow.  Screening colonoscopy can be as an outpatient.  Owens Loffler, MD [facility] Pager [phone]

## 2016-03-20 NOTE — Progress Notes (Cosign Needed)
   ESRD  PT on mwf SCHEDULE  Admitted to observation with Nausea and vomiting, dysphasia and intermittent benign aphasia to solids and liquids with GI wu / HD orders written for today / will see with consult note  If  Admitted.     Ernest Haber, PA-C [place] [phone] 03/20/2016,1:09 PM

## 2016-03-20 NOTE — Interval H&P Note (Signed)
History and Physical Interval Note:  03/20/2016 8:54 AM  [name]  has presented today for surgery, with the diagnosis of Dysphagia and abnormal esophagram  The various methods of treatment have been discussed with the patient and family. After consideration of risks, benefits and other options for treatment, the patient has consented to  Procedure(s): ESOPHAGOGASTRODUODENOSCOPY (EGD) (N/A) as a surgical intervention .  The patient's history has been reviewed, patient examined, no change in status, stable for surgery.  I have reviewed the patient's chart and labs.  Questions were answered to the patient's satisfaction.     Carolyn Valenzuela

## 2016-03-20 NOTE — Op Note (Signed)
[facility] writing system was not working this AM. This will serve as her procedure note.  Procedure EGD with biopsy Meds fentanyl 78mcg IV, versed 5mg  IV  Findings: 1. LA grade C ulcerative reflux type esophagitis with surrounding edema.  2. Mild to moderate distal gastritis, biopsied to check for H. Pylori  Recommendations: She should stay on twice daily PPI for 6 weeks and then OK to decrease to once daily. She should refrain from taking alka-seltzer and NSAIDs.  I will resume low salt diet, she is safe for d/c from GI standpoint when eating better.  If biopsies show H. Pylori she will be started on appropriate antibiotics.   Please call or page with any further questions or concerns.

## 2016-03-20 NOTE — Discharge Instructions (Signed)
Pantoprazole tablets What is this medicine? PANTOPRAZOLE (pan TOE pra zole) prevents the production of acid in the stomach. It is used to treat gastroesophageal reflux disease (GERD), inflammation of the esophagus, and Zollinger-Ellison syndrome. This medicine may be used for other purposes; ask your health care provider or pharmacist if you have questions. What should I tell my health care provider before I take this medicine? They need to know if you have any of these conditions: -liver disease -low levels of magnesium in the blood -an unusual or allergic reaction to omeprazole, lansoprazole, pantoprazole, rabeprazole, other medicines, foods, dyes, or preservatives -pregnant or trying to get pregnant -breast-feeding How should I use this medicine? Take this medicine by mouth. Swallow the tablets whole with a drink of water. Follow the directions on the prescription label. Do not crush, break, or chew. Take your medicine at regular intervals. Do not take your medicine more often than directed. Talk to your pediatrician regarding the use of this medicine in children. While this drug may be prescribed for children as young as 5 years for selected conditions, precautions do apply. Overdosage: If you think you have taken too much of this medicine contact a poison control center or emergency room at once. NOTE: This medicine is only for you. Do not share this medicine with others. What if I miss a dose? If you miss a dose, take it as soon as you can. If it is almost time for your next dose, take only that dose. Do not take double or extra doses. What may interact with this medicine? Do not take this medicine with any of the following medications: -atazanavir -nelfinavir This medicine may also interact with the following medications: -ampicillin -delavirdine -erlotinib -iron salts -medicines for fungal infections like ketoconazole, itraconazole and voriconazole -methotrexate -mycophenolate  mofetil -warfarin This list may not describe all possible interactions. Give your health care provider a list of all the medicines, herbs, non-prescription drugs, or dietary supplements you use. Also tell them if you smoke, drink alcohol, or use illegal drugs. Some items may interact with your medicine. What should I watch for while using this medicine? It can take several days before your stomach pain gets better. Check with your doctor or health care professional if your condition does not start to get better, or if it gets worse. You may need blood work done while you are taking this medicine. What side effects may I notice from receiving this medicine? Side effects that you should report to your doctor or health care professional as soon as possible: -allergic reactions like skin rash, itching or hives, swelling of the face, lips, or tongue -bone, muscle or joint pain -breathing problems -chest pain or chest tightness -dark yellow or brown urine -dizziness -fast, irregular heartbeat -feeling faint or lightheaded -fever or sore throat -muscle spasm -palpitations -redness, blistering, peeling or loosening of the skin, including inside the mouth -seizures -tremors -unusual bleeding or bruising -unusually weak or tired -yellowing of the eyes or skin Side effects that usually do not require medical attention (Report these to your doctor or health care professional if they continue or are bothersome.): -constipation -diarrhea -dry mouth -headache -nausea This list may not describe all possible side effects. Call your doctor for medical advice about side effects. You may report side effects to FDA at 1-800-FDA-1088. Where should I keep my medicine? Keep out of the reach of children. Store at room temperature between 15 and 30 degrees C (59 and 86 degrees F). Protect from light   and moisture. Throw away any unused medicine after the expiration date. NOTE: This sheet is a summary. It may  not cover all possible information. If you have questions about this medicine, talk to your doctor, pharmacist, or health care provider.    2016, Elsevier/Gold Standard. (2014-10-05 14:45:56)  

## 2016-03-20 NOTE — Discharge Summary (Signed)
Physician Discharge Summary  Carolyn Valenzuela [id number] DOB: [birth year] DOA: 03/18/2016  PCP: Cathlean Cower, MD  Admit date: 03/18/2016 Discharge date: 03/20/2016  Recommendations for Outpatient Follow-up:  1. Pt will need to follow up with PCP in 2-3 weeks post discharge 2. Recommendations from GI doctor - stay on twice daily PPI for 6 weeks and then OK to decrease to once daily, refrain from taking alka-seltzer and NSAIDs. - If biopsies show H. Pylori she will be started on appropriate antibiotics.   Discharge Diagnoses:  Active Problems:   S/P BKA (below knee amputation) (HCC)   ESRD needing dialysis ([place])   Type 2 diabetes mellitus with peripheral neuropathy (HCC)   Dysphagia   Nausea and vomiting  Discharge Condition: Stable  Diet recommendation: Heart healthy diet discussed in details   Brief Narrative:  65 y.o. female with ESRD on HD(M/W/F), DM type 2 with peripheral neuropathy, anemia, HTN, PVD; who presented with nausea and vomiting, dysphagia to solids and occasionally to liquids.   Assessment & Plan:  Nausea and vomiting, dysphasia and intermittent benign aphasia to solids and liquids - Esophagogram with nonspecific esophageal motility disorder, distal esophageal stricture noted, barium tablet did not pass beyond this location - upper endoscopy with: 1. LA grade C ulcerative reflux type esophagitis with surrounding edema.  2. Mild to moderate distal gastritis, biopsied to check for H. Pylori - Recommendations: - stay on twice daily PPI for 6 weeks and then OK to decrease to once daily, refrain from taking alka-seltzer and NSAIDs. - safe for d/c from GI standpoint when eating better - If biopsies show H. Pylori she will be started on appropriate antibiotics.   Hypokalemia, hypochloremia - In the setting of vomiting - Supplemented and within normal limits  Diabetes mellitus type II, uncontrolled with peripheral neuropathy, PVD, bilateral BKA - Last hemoglobin  A1c on 7/13 was 8.3  Accelerated hypertension - continue home medical regimen   ESRD on HD - Dialyzes Monday Wednesday and Friday - Appreciate nephrology team following - can go home after HD  GERD  - continue Pepcid   Hyperlipidemia - Continue Zocor  Status post bilateral BKA - Patient has prosthetics.  DVT prophylaxis: Heparin SQ Code Status: Full Family Communication: Patient and sister at bedside Disposition Plan: Home  Consultants:   Gastroenterology  Nephrology for dialysis  Procedures:   Esophagogram 03/19/2016, results below  EGD 7/21  Antimicrobials:   None  Procedures/Studies: Dg Esophagus  03/19/2016  CLINICAL DATA:  Odynophagia. Hoarseness. Sensation of food getting caught in throat. EXAM: ESOPHOGRAM/BARIUM SWALLOW TECHNIQUE: Single contrast examination was performed using  thin barium. FLUOROSCOPY TIME:  Radiation Exposure Index (as provided by the fluoroscopic device): If the device does not provide the exposure index: Fluoroscopy Time:  1 minutes and 30 seconds. Number of Acquired Images: COMPARISON:  None. FINDINGS: DUe to issues within mobility in this patient, exam was performed in an LPO position relative to the fluoro table with the patient either at 0 or 45 degree head up positioning. There is no evidence for esophageal diverticulum or gross mucosal ulceration. There is disruption of primary peristalsis on nearly all swallows consistent with nonspecific esophageal motility disorder. Narrowing of the esophagus at the esophagogastric junction is evident. 13 mm barium tablet lodges at the distal esophagus despite repeated swallows of both thin barium and water. IMPRESSION: 1. Nonspecific esophageal motility disorder. 2. Distal esophageal stricture appears smooth shouldered without marked mucosal irregularity. 13 mm barium tablet does not pass beyond this location. Upper endoscopy  may prove helpful to further evaluate. Electronically Signed   By: Misty Stanley M.D.   On: 03/19/2016 10:07   Dg Abd Acute W/chest  03/18/2016  CLINICAL DATA:  Abdominal pain EXAM: DG ABDOMEN ACUTE W/ 1V CHEST COMPARISON:  CT scan July 09, 2011 and chest x-ray December 11, 2015 FINDINGS: The chest is unremarkable. No free air, portal venous gas, or pneumatosis. The bowel gas pattern is nonobstructive although there is a paucity of bowel gas limiting evaluation. Large calcifications in the pelvis are consistent with calcified fibroids. No other acute abnormalities. IMPRESSION: Negative abdominal radiographs.  No acute cardiopulmonary disease. Electronically Signed   By: Dorise Bullion III M.D   On: 03/18/2016 23:29    Discharge Exam: Filed Vitals:   03/20/16 0920 03/20/16 0930  BP: 148/38 155/48  Pulse: 76 77  Temp:    Resp: 12 8   Filed Vitals:   03/20/16 0910 03/20/16 0915 03/20/16 0920 03/20/16 0930  BP: 171/37 153/38 148/38 155/48  Pulse: 84 77 76 77  Temp:      TempSrc:      Resp: 11 14 12 8   Weight:      SpO2: 98% 100% 99% 95%    General: Pt is alert, follows commands appropriately, not in acute distress Cardiovascular: Regular rate and rhythm, no rubs, no gallops Respiratory: Clear to auscultation bilaterally, no wheezing, no crackles, no rhonchi Abdominal: Soft, non tender, non distended, bowel sounds +, no guarding  Discharge Instructions     Medication List    TAKE these medications        acetaminophen 325 MG tablet  Commonly known as:  TYLENOL  Take 2 tablets (650 mg total) by mouth every 6 (six) hours as needed for mild pain, moderate pain or fever.     amLODipine 10 MG tablet  Commonly known as:  NORVASC  Take 1 tablet (10 mg total) by mouth daily.     aspirin 81 MG EC tablet  Take 81 mg by mouth daily.     [employer] ACID REDUCER 10 MG tablet  Generic drug:  famotidine  TAKE 1 TABLET (10 MG TOTAL) BY MOUTH DAILY.     furosemide 40 MG tablet  Commonly known as:  LASIX  TAKE 2 TABLETS BY MOUTH EVERY MORNING AND TAKE 1 TABLET  EVERY EVENING     gabapentin 100 MG capsule  Commonly known as:  NEURONTIN  Take 1 capsule by mouth 2 (two) times daily.     glipiZIDE 10 MG 24 hr tablet  Commonly known as:  GLUCOTROL XL  Take 1 tablet (10 mg total) by mouth daily with breakfast.     glucose blood test strip  Commonly known as:  ONETOUCH VERIO  1 each by Other route 2 (two) times daily. Use to check blood sugars twice a day Dx [id number]     ONETOUCH DELICA LANCETS FINE Misc  Use to help check blood sugars twice a day Dx e11.9     oxyCODONE-acetaminophen 5-325 MG tablet  Commonly known as:  PERCOCET/ROXICET  Take 1 tablet by mouth every 6 (six) hours as needed for severe pain.     pantoprazole 40 MG tablet  Commonly known as:  PROTONIX  Take 1 tablet (40 mg total) by mouth 2 (two) times daily before a meal.     senna 8.6 MG Tabs tablet  Commonly known as:  SENOKOT  Take 2 tablets (17.2 mg total) by mouth 2 (two) times daily before a meal.  sevelamer carbonate 800 MG tablet  Commonly known as:  RENVELA  Take 1,600 mg by mouth 3 (three) times daily with meals.     simvastatin 20 MG tablet  Commonly known as:  ZOCOR  Take 1 tablet (20 mg total) by mouth daily at 6 PM.     traZODone 50 MG tablet  Commonly known as:  DESYREL  TAKE 0.5-1 TABLETS (25-50 MG TOTAL) BY MOUTH AT BEDTIME AS NEEDED FOR SLEEP.            Follow-up Information    Follow up with Cathlean Cower, MD.   Specialties:  Internal Medicine, Radiology   Contact information:   [address] [place] [address] [phone]        The results of significant diagnostics from this hospitalization (including imaging, microbiology, ancillary and laboratory) are listed below for reference.     Microbiology: Recent Results (from the past 240 hour(s))  MRSA PCR Screening     Status: None   Collection Time: 03/19/16  1:02 AM  Result Value Ref Range Status   MRSA by PCR NEGATIVE NEGATIVE Final    Comment:        The GeneXpert MRSA  Assay (FDA approved for NASAL specimens only), is one component of a comprehensive MRSA colonization surveillance program. It is not intended to diagnose MRSA infection nor to guide or monitor treatment for MRSA infections.      Labs: Basic Metabolic Panel:  Recent Labs Lab 03/18/16 1825 03/19/16 0547 03/20/16 0431  NA 138 137 136  K 2.5* 4.2 5.0  CL 96* 98* 97*  CO2 33* 31 30  GLUCOSE 157* 151* 111*  BUN 6 12 22*  CREATININE 2.33* 3.47* 5.40*  CALCIUM 9.2 8.9 9.6  MG 2.0  --   --   PHOS  --   --  4.7*   Liver Function Tests:  Recent Labs Lab 03/18/16 1825 03/20/16 0431  AST 14*  --   ALT 7*  --   ALKPHOS 61  --   BILITOT 0.3  --   PROT 7.3  --   ALBUMIN 3.7 3.1*    Recent Labs Lab 03/18/16 1825  LIPASE 17   CBC:  Recent Labs Lab 03/18/16 1825 03/19/16 0547 03/20/16 0431  WBC 5.4 6.0 6.5  HGB 11.0* 10.2* 10.4*  HCT 34.0* 31.9* 32.8*  MCV 87.6 88.6 89.4  PLT 217 188 206   ProBNP (last 3 results) No results for input(s): PROBNP in the last 8760 hours.  CBG:  Recent Labs Lab 03/19/16 0737 03/19/16 1144 03/19/16 1705 03/19/16 2015 03/20/16 0739  GLUCAP 142* 160* 252* 77 131*   SIGNED: Time coordinating discharge:  30 minutes  MAGICK-Geraldyne Barraclough, MD  [facility] 03/20/2016, 11:51 AM Pager [phone]  If 7PM-7AM, please contact night-coverage [email or web address] Password [email or web address]

## 2016-03-21 ENCOUNTER — Encounter (HOSPITAL_COMMUNITY): Payer: Self-pay | Admitting: Gastroenterology

## 2016-03-30 ENCOUNTER — Telehealth: Payer: Self-pay

## 2016-03-30 NOTE — Telephone Encounter (Signed)
Patient called and said the what they did to her throat did not help at all. She wants to know what to do next. She states she can not sleep because of it. I informed her, she would need a follow up appointment with you. But she states she was just here and if I could just send you a message first. Please follow up, Thank you.

## 2016-03-30 NOTE — Telephone Encounter (Signed)
Unfortunately I am not able to further help in this regard,  I would f/u with Dr Elwyn Reach

## 2016-03-31 NOTE — Telephone Encounter (Signed)
Left message to give us a call back

## 2016-04-02 ENCOUNTER — Encounter
Payer: BC Managed Care – PPO | Attending: Physical Medicine & Rehabilitation | Admitting: Physical Medicine & Rehabilitation

## 2016-04-02 DIAGNOSIS — Z89512 Acquired absence of left leg below knee: Secondary | ICD-10-CM | POA: Insufficient documentation

## 2016-04-02 DIAGNOSIS — N186 End stage renal disease: Secondary | ICD-10-CM | POA: Insufficient documentation

## 2016-04-02 DIAGNOSIS — G8929 Other chronic pain: Secondary | ICD-10-CM | POA: Insufficient documentation

## 2016-04-02 DIAGNOSIS — M5412 Radiculopathy, cervical region: Secondary | ICD-10-CM | POA: Insufficient documentation

## 2016-04-02 DIAGNOSIS — E1122 Type 2 diabetes mellitus with diabetic chronic kidney disease: Secondary | ICD-10-CM | POA: Insufficient documentation

## 2016-04-02 DIAGNOSIS — G629 Polyneuropathy, unspecified: Secondary | ICD-10-CM | POA: Insufficient documentation

## 2016-04-02 DIAGNOSIS — N2581 Secondary hyperparathyroidism of renal origin: Secondary | ICD-10-CM | POA: Insufficient documentation

## 2016-04-02 DIAGNOSIS — M549 Dorsalgia, unspecified: Secondary | ICD-10-CM | POA: Insufficient documentation

## 2016-04-02 DIAGNOSIS — I120 Hypertensive chronic kidney disease with stage 5 chronic kidney disease or end stage renal disease: Secondary | ICD-10-CM | POA: Insufficient documentation

## 2016-04-02 DIAGNOSIS — R262 Difficulty in walking, not elsewhere classified: Secondary | ICD-10-CM | POA: Insufficient documentation

## 2016-04-02 DIAGNOSIS — Z79899 Other long term (current) drug therapy: Secondary | ICD-10-CM | POA: Insufficient documentation

## 2016-04-02 DIAGNOSIS — I739 Peripheral vascular disease, unspecified: Secondary | ICD-10-CM | POA: Insufficient documentation

## 2016-04-02 DIAGNOSIS — R531 Weakness: Secondary | ICD-10-CM | POA: Insufficient documentation

## 2016-04-02 DIAGNOSIS — R200 Anesthesia of skin: Secondary | ICD-10-CM | POA: Insufficient documentation

## 2016-04-02 DIAGNOSIS — E785 Hyperlipidemia, unspecified: Secondary | ICD-10-CM | POA: Insufficient documentation

## 2016-04-02 DIAGNOSIS — Z992 Dependence on renal dialysis: Secondary | ICD-10-CM | POA: Insufficient documentation

## 2016-04-02 DIAGNOSIS — Z993 Dependence on wheelchair: Secondary | ICD-10-CM | POA: Insufficient documentation

## 2016-04-08 ENCOUNTER — Telehealth: Payer: Self-pay | Admitting: Gastroenterology

## 2016-04-09 NOTE — Telephone Encounter (Signed)
  Per Dr Ardis Hughs pathology result note.Please call the patient. The biopsies from last week EGD showed no sign of infection, cancer. Should continue with the suggestions outlined at recent visit.    No answer and no voice mail

## 2016-04-09 NOTE — Telephone Encounter (Signed)
The pt has been notified of the normal EGD pathology results

## 2016-05-27 DIAGNOSIS — Z23 Encounter for immunization: Secondary | ICD-10-CM | POA: Insufficient documentation

## 2016-06-22 ENCOUNTER — Telehealth: Payer: Self-pay | Admitting: Internal Medicine

## 2016-06-22 NOTE — Telephone Encounter (Signed)
[place] Day - Client [facility]     Patient Name: [facility] Rodriquez Initial Comment Caller states, she has shortness of breath. Verified   DOB: Jun 17, 1951      Nurse Assessment  Nurse: Einar Gip, RN, Deborah Date/Time (Eastern Time): 06/22/2016 4:38:57 PM  Confirm and document reason for call. If symptomatic, describe symptoms. You must click the next button to save text entered. ---Caller states she is having shortness of breath. On going intermittently since last week.  Has the patient traveled out of the country within the last 30 days? ---No  Does the patient have any new or worsening symptoms? ---Yes  Will a triage be completed? ---Yes  Related visit to physician within the last 2 weeks? ---No  Does the PT have any chronic conditions? (i.e. diabetes, asthma, etc.) ---Yes  List chronic conditions. ---diabetes, hypertension,  Is this a behavioral health or substance abuse call? ---No    Guidelines     Guideline Title Affirmed Question Affirmed Notes   Breathing Difficulty [1] MODERATE difficulty breathing (e.g., speaks in phrases, SOB even at rest, pulse 100-120) AND [2] NEW-onset or WORSE than normal    Final Disposition User   Go to ED Now Einar Gip, RN, Neoma Laming   Comments   Caller refused ER outcome and wants an appointment. Caller now states she also goes to dialysis for kidney failure. Again advised caller to go to ER. Caller requests appointment. Advised we will send a message to the office and they will return her call- no time frame given. Caller verbalized understanding.   Referrals   GO TO FACILITY REFUSED   Disagree/Comply: Disagree   Disagree/Comply Reason: Disagree with instructions

## 2016-06-29 ENCOUNTER — Encounter: Payer: Self-pay | Admitting: Vascular Surgery

## 2016-06-29 ENCOUNTER — Encounter (HOSPITAL_COMMUNITY): Payer: Self-pay | Admitting: Emergency Medicine

## 2016-06-29 ENCOUNTER — Inpatient Hospital Stay (HOSPITAL_COMMUNITY)
Admission: EM | Admit: 2016-06-29 | Discharge: 2016-07-04 | DRG: 246 | Disposition: A | Discharge status: Home / Self Care | Payer: Medicare Other | Attending: Internal Medicine | Admitting: Internal Medicine

## 2016-06-29 ENCOUNTER — Emergency Department (HOSPITAL_COMMUNITY): Payer: Medicare Other

## 2016-06-29 DIAGNOSIS — I080 Rheumatic disorders of both mitral and aortic valves: Secondary | ICD-10-CM | POA: Diagnosis present

## 2016-06-29 DIAGNOSIS — J189 Pneumonia, unspecified organism: Secondary | ICD-10-CM | POA: Diagnosis not present

## 2016-06-29 DIAGNOSIS — E8889 Other specified metabolic disorders: Secondary | ICD-10-CM | POA: Diagnosis not present

## 2016-06-29 DIAGNOSIS — Z885 Allergy status to narcotic agent status: Secondary | ICD-10-CM

## 2016-06-29 DIAGNOSIS — E785 Hyperlipidemia, unspecified: Secondary | ICD-10-CM | POA: Diagnosis present

## 2016-06-29 DIAGNOSIS — I132 Hypertensive heart and chronic kidney disease with heart failure and with stage 5 chronic kidney disease, or end stage renal disease: Secondary | ICD-10-CM | POA: Diagnosis present

## 2016-06-29 DIAGNOSIS — E1165 Type 2 diabetes mellitus with hyperglycemia: Secondary | ICD-10-CM | POA: Diagnosis present

## 2016-06-29 DIAGNOSIS — Z79899 Other long term (current) drug therapy: Secondary | ICD-10-CM

## 2016-06-29 DIAGNOSIS — N189 Chronic kidney disease, unspecified: Secondary | ICD-10-CM

## 2016-06-29 DIAGNOSIS — I214 Non-ST elevation (NSTEMI) myocardial infarction: Secondary | ICD-10-CM | POA: Diagnosis not present

## 2016-06-29 DIAGNOSIS — E440 Moderate protein-calorie malnutrition: Secondary | ICD-10-CM | POA: Diagnosis not present

## 2016-06-29 DIAGNOSIS — I120 Hypertensive chronic kidney disease with stage 5 chronic kidney disease or end stage renal disease: Secondary | ICD-10-CM | POA: Diagnosis present

## 2016-06-29 DIAGNOSIS — R7989 Other specified abnormal findings of blood chemistry: Secondary | ICD-10-CM

## 2016-06-29 DIAGNOSIS — I10 Essential (primary) hypertension: Secondary | ICD-10-CM | POA: Diagnosis not present

## 2016-06-29 DIAGNOSIS — D631 Anemia in chronic kidney disease: Secondary | ICD-10-CM | POA: Diagnosis present

## 2016-06-29 DIAGNOSIS — I959 Hypotension, unspecified: Secondary | ICD-10-CM | POA: Diagnosis not present

## 2016-06-29 DIAGNOSIS — N2581 Secondary hyperparathyroidism of renal origin: Secondary | ICD-10-CM | POA: Diagnosis present

## 2016-06-29 DIAGNOSIS — E1122 Type 2 diabetes mellitus with diabetic chronic kidney disease: Secondary | ICD-10-CM | POA: Diagnosis present

## 2016-06-29 DIAGNOSIS — E1151 Type 2 diabetes mellitus with diabetic peripheral angiopathy without gangrene: Secondary | ICD-10-CM | POA: Diagnosis present

## 2016-06-29 DIAGNOSIS — I2510 Atherosclerotic heart disease of native coronary artery without angina pectoris: Secondary | ICD-10-CM | POA: Diagnosis present

## 2016-06-29 DIAGNOSIS — R059 Cough, unspecified: Secondary | ICD-10-CM

## 2016-06-29 DIAGNOSIS — Z6826 Body mass index (BMI) 26.0-26.9, adult: Secondary | ICD-10-CM

## 2016-06-29 DIAGNOSIS — Z89519 Acquired absence of unspecified leg below knee: Secondary | ICD-10-CM

## 2016-06-29 DIAGNOSIS — E876 Hypokalemia: Secondary | ICD-10-CM | POA: Diagnosis present

## 2016-06-29 DIAGNOSIS — Z89512 Acquired absence of left leg below knee: Secondary | ICD-10-CM

## 2016-06-29 DIAGNOSIS — E1140 Type 2 diabetes mellitus with diabetic neuropathy, unspecified: Secondary | ICD-10-CM | POA: Diagnosis present

## 2016-06-29 DIAGNOSIS — Y95 Nosocomial condition: Secondary | ICD-10-CM | POA: Diagnosis not present

## 2016-06-29 DIAGNOSIS — Z951 Presence of aortocoronary bypass graft: Secondary | ICD-10-CM

## 2016-06-29 DIAGNOSIS — E46 Unspecified protein-calorie malnutrition: Secondary | ICD-10-CM | POA: Insufficient documentation

## 2016-06-29 DIAGNOSIS — N186 End stage renal disease: Secondary | ICD-10-CM | POA: Diagnosis not present

## 2016-06-29 DIAGNOSIS — R778 Other specified abnormalities of plasma proteins: Secondary | ICD-10-CM

## 2016-06-29 DIAGNOSIS — R0602 Shortness of breath: Secondary | ICD-10-CM

## 2016-06-29 DIAGNOSIS — Z89511 Acquired absence of right leg below knee: Secondary | ICD-10-CM

## 2016-06-29 DIAGNOSIS — R069 Unspecified abnormalities of breathing: Secondary | ICD-10-CM | POA: Diagnosis not present

## 2016-06-29 DIAGNOSIS — R0789 Other chest pain: Secondary | ICD-10-CM | POA: Diagnosis not present

## 2016-06-29 DIAGNOSIS — Z992 Dependence on renal dialysis: Secondary | ICD-10-CM | POA: Diagnosis not present

## 2016-06-29 DIAGNOSIS — Z7982 Long term (current) use of aspirin: Secondary | ICD-10-CM

## 2016-06-29 DIAGNOSIS — R05 Cough: Secondary | ICD-10-CM

## 2016-06-29 DIAGNOSIS — R079 Chest pain, unspecified: Secondary | ICD-10-CM | POA: Diagnosis not present

## 2016-06-29 HISTORY — DX: Pneumonia, unspecified organism: J18.9

## 2016-06-29 LAB — CBC WITH DIFFERENTIAL/PLATELET
Basophils Absolute: 0.1 10*3/uL (ref 0.0–0.1)
Basophils Relative: 1 %
Eosinophils Absolute: 0.1 10*3/uL (ref 0.0–0.7)
Eosinophils Relative: 1 %
HCT: 32.2 % — ABNORMAL LOW (ref 36.0–46.0)
Hemoglobin: 10.6 g/dL — ABNORMAL LOW (ref 12.0–15.0)
Lymphocytes Relative: 42 %
Lymphs Abs: 1.9 10*3/uL (ref 0.7–4.0)
MCH: 29.2 pg (ref 26.0–34.0)
MCHC: 32.9 g/dL (ref 30.0–36.0)
MCV: 88.7 fL (ref 78.0–100.0)
Monocytes Absolute: 0.4 10*3/uL (ref 0.1–1.0)
Monocytes Relative: 8 %
Neutro Abs: 2.2 10*3/uL (ref 1.7–7.7)
Neutrophils Relative %: 48 %
Platelets: 182 10*3/uL (ref 150–400)
RBC: 3.63 MIL/uL — ABNORMAL LOW (ref 3.87–5.11)
RDW: 17.4 % — ABNORMAL HIGH (ref 11.5–15.5)
WBC: 4.6 10*3/uL (ref 4.0–10.5)

## 2016-06-29 LAB — TROPONIN I
Troponin I: 0.81 ng/mL (ref <0.03)
Troponin I: 0.75 ng/mL (ref <0.03)

## 2016-06-29 LAB — INFLUENZA PANEL BY PCR (TYPE A & B)
H1N1 flu by pcr: NOT DETECTED
Influenza A By PCR: NEGATIVE
Influenza B By PCR: NEGATIVE

## 2016-06-29 LAB — MAGNESIUM: Magnesium: 1.9 mg/dL (ref 1.7–2.4)

## 2016-06-29 LAB — COMPREHENSIVE METABOLIC PANEL
ALT: 7 U/L — ABNORMAL LOW (ref 14–54)
AST: 16 U/L (ref 15–41)
Albumin: 3.6 g/dL (ref 3.5–5.0)
Alkaline Phosphatase: 72 U/L (ref 38–126)
Anion gap: 13 (ref 5–15)
BUN: 8 mg/dL (ref 6–20)
CO2: 27 mmol/L (ref 22–32)
Calcium: 8.9 mg/dL (ref 8.9–10.3)
Chloride: 97 mmol/L — ABNORMAL LOW (ref 101–111)
Creatinine, Ser: 2.88 mg/dL — ABNORMAL HIGH (ref 0.44–1.00)
GFR calc Af Amer: 19 mL/min — ABNORMAL LOW (ref >60)
GFR calc non Af Amer: 16 mL/min — ABNORMAL LOW (ref >60)
Glucose, Bld: 120 mg/dL — ABNORMAL HIGH (ref 65–99)
Potassium: 2.9 mmol/L — ABNORMAL LOW (ref 3.5–5.1)
Sodium: 137 mmol/L (ref 135–145)
Total Bilirubin: 0.5 mg/dL (ref 0.3–1.2)
Total Protein: 7.1 g/dL (ref 6.5–8.1)

## 2016-06-29 LAB — I-STAT TROPONIN, ED: Troponin i, poc: 0.73 ng/mL (ref 0.00–0.08)

## 2016-06-29 LAB — GLUCOSE, CAPILLARY
Glucose-Capillary: 170 mg/dL — ABNORMAL HIGH (ref 65–99)
Glucose-Capillary: 146 mg/dL — ABNORMAL HIGH (ref 65–99)

## 2016-06-29 LAB — I-STAT CG4 LACTIC ACID, ED: Lactic Acid, Venous: 1.31 mmol/L (ref 0.5–1.9)

## 2016-06-29 LAB — PROCALCITONIN: Procalcitonin: 0.34 ng/mL

## 2016-06-29 MED ORDER — DEXTROSE 5 % IV SOLN
2.0000 g | INTRAVENOUS | Status: DC
  Administered 2016-07-01: 2 g via INTRAVENOUS
  Filled 2016-06-29 (×2): qty 2

## 2016-06-29 MED ORDER — SODIUM CHLORIDE 0.9 % IV SOLN: 250.0000 mL | INTRAVENOUS | Status: DC | PRN

## 2016-06-29 MED ORDER — INSULIN ASPART 100 UNIT/ML ~~LOC~~ SOLN
0.0000 [IU] | Freq: Three times a day (TID) | SUBCUTANEOUS | Status: DC
  Administered 2016-06-29 – 2016-06-30 (×2): 2 [IU] via SUBCUTANEOUS
  Administered 2016-07-01: 3 [IU] via SUBCUTANEOUS
  Administered 2016-07-03: 12:00:00 2 [IU] via SUBCUTANEOUS
  Administered 2016-07-04: 3 [IU] via SUBCUTANEOUS
  Administered 2016-07-04: 07:00:00 2 [IU] via SUBCUTANEOUS

## 2016-06-29 MED ORDER — METOPROLOL TARTRATE 12.5 MG HALF TABLET
12.5000 mg | ORAL_TABLET | Freq: Two times a day (BID) | ORAL | Status: DC
  Filled 2016-06-29 (×2): qty 1

## 2016-06-29 MED ORDER — ACETAMINOPHEN 325 MG PO TABS
650.0000 mg | ORAL_TABLET | Freq: Four times a day (QID) | ORAL | Status: DC | PRN
  Administered 2016-06-29 – 2016-07-03 (×7): 650 mg via ORAL
  Filled 2016-06-29 (×7): qty 2

## 2016-06-29 MED ORDER — HEPARIN SODIUM (PORCINE) 5000 UNIT/ML IJ SOLN: 5000.0000 [IU] | Freq: Three times a day (TID) | INTRAMUSCULAR | Status: DC

## 2016-06-29 MED ORDER — POTASSIUM CHLORIDE CRYS ER 20 MEQ PO TBCR
40.0000 meq | EXTENDED_RELEASE_TABLET | Freq: Once | ORAL | Status: AC
  Administered 2016-06-29: 40 meq via ORAL
  Filled 2016-06-29: qty 2

## 2016-06-29 MED ORDER — VANCOMYCIN HCL 500 MG IV SOLR
500.0000 mg | INTRAVENOUS | Status: DC
  Administered 2016-07-01: 500 mg via INTRAVENOUS
  Filled 2016-06-29: qty 500

## 2016-06-29 MED ORDER — HEPARIN BOLUS VIA INFUSION
3000.0000 [IU] | Freq: Once | INTRAVENOUS | Status: AC
  Administered 2016-06-29: 3000 [IU] via INTRAVENOUS
  Filled 2016-06-29: qty 3000

## 2016-06-29 MED ORDER — ASPIRIN EC 81 MG PO TBEC
81.0000 mg | DELAYED_RELEASE_TABLET | Freq: Every day | ORAL | Status: DC
  Administered 2016-06-29 – 2016-07-04 (×5): 81 mg via ORAL
  Filled 2016-06-29 (×5): qty 1

## 2016-06-29 MED ORDER — HEPARIN (PORCINE) IN NACL 100-0.45 UNIT/ML-% IJ SOLN
12.0000 [IU]/kg/h | INTRAMUSCULAR | Status: DC
  Administered 2016-06-29: 12 [IU]/kg/h via INTRAVENOUS
  Filled 2016-06-29: qty 250

## 2016-06-29 MED ORDER — SEVELAMER CARBONATE 800 MG PO TABS
1600.0000 mg | ORAL_TABLET | Freq: Three times a day (TID) | ORAL | Status: DC
  Administered 2016-06-30 – 2016-07-04 (×8): 1600 mg via ORAL
  Filled 2016-06-29 (×8): qty 2

## 2016-06-29 MED ORDER — ACETAMINOPHEN 650 MG RE SUPP: 650.0000 mg | Freq: Four times a day (QID) | RECTAL | Status: DC | PRN

## 2016-06-29 MED ORDER — SODIUM CHLORIDE 0.9% FLUSH: 3.0000 mL | INTRAVENOUS | Status: DC | PRN

## 2016-06-29 MED ORDER — GABAPENTIN 100 MG PO CAPS
100.0000 mg | ORAL_CAPSULE | Freq: Two times a day (BID) | ORAL | Status: DC
  Administered 2016-06-29 – 2016-07-03 (×7): 100 mg via ORAL
  Filled 2016-06-29 (×7): qty 1

## 2016-06-29 MED ORDER — ONDANSETRON HCL 4 MG PO TABS
4.0000 mg | ORAL_TABLET | Freq: Four times a day (QID) | ORAL | Status: DC | PRN
Start: 2016-06-29 — End: 2016-07-04
  Filled 2016-06-29: qty 1

## 2016-06-29 MED ORDER — PANTOPRAZOLE SODIUM 40 MG PO TBEC
40.0000 mg | DELAYED_RELEASE_TABLET | Freq: Two times a day (BID) | ORAL | Status: DC
  Administered 2016-06-29 – 2016-06-30 (×2): 40 mg via ORAL
  Filled 2016-06-29 (×2): qty 1

## 2016-06-29 MED ORDER — SODIUM CHLORIDE 0.9% FLUSH: 3.0000 mL | Freq: Two times a day (BID) | INTRAVENOUS | Status: DC

## 2016-06-29 MED ORDER — DEXTROSE 5 % IV SOLN
2.0000 g | Freq: Once | INTRAVENOUS | Status: AC
  Administered 2016-06-29: 2 g via INTRAVENOUS
  Filled 2016-06-29: qty 2

## 2016-06-29 MED ORDER — VANCOMYCIN HCL IN DEXTROSE 1-5 GM/200ML-% IV SOLN
1000.0000 mg | Freq: Once | INTRAVENOUS | Status: AC
  Administered 2016-06-29: 1000 mg via INTRAVENOUS
  Filled 2016-06-29: qty 200

## 2016-06-29 MED ORDER — ONDANSETRON HCL 4 MG/2ML IJ SOLN
4.0000 mg | Freq: Four times a day (QID) | INTRAMUSCULAR | Status: DC | PRN
  Administered 2016-07-01: 4 mg via INTRAVENOUS
  Filled 2016-06-29: qty 2

## 2016-06-29 MED ORDER — ATORVASTATIN CALCIUM 80 MG PO TABS
80.0000 mg | ORAL_TABLET | Freq: Every day | ORAL | Status: DC
  Administered 2016-06-29 – 2016-07-03 (×5): 80 mg via ORAL
  Filled 2016-06-29 (×5): qty 1

## 2016-06-29 MED ORDER — GLIPIZIDE ER 10 MG PO TB24
10.0000 mg | ORAL_TABLET | Freq: Every day | ORAL | Status: DC
  Filled 2016-06-29 (×2): qty 1

## 2016-06-29 MED ORDER — ASPIRIN 81 MG PO CHEW
81.0000 mg | CHEWABLE_TABLET | ORAL | Status: AC
  Administered 2016-06-30: 81 mg via ORAL
  Filled 2016-06-29: qty 1

## 2016-06-29 MED ORDER — SODIUM CHLORIDE 0.9 % IV SOLN: INTRAVENOUS | Status: DC

## 2016-06-29 NOTE — ED Notes (Addendum)
Pt's sister had to leave but left her number in case she needs to be reached. [name]: [phone]

## 2016-06-29 NOTE — H&P (Addendum)
[facility] History and Physical  Carolyn Valenzuela XHB:[id number] DOB: [birth year] DOA: 06/29/2016  Referring physician: ER PCP: Cathlean Cower, MD   Chief Complaint:  Chest pain  HPI:  65 year old female with end-stage renal disease(HD initiated 1/2-14/2016) MWF at [facility] , diabetes, hypertension, dyslipidemia, peripheral vascular disease , who presents to the ER after dialysis due to complaints of worsening shortness of breath with exertion for the last 2 weeks. Patient also describes mid chest/epigastric pain for the past 2 weeks. She states that the chest pain radiates to both her shoulders. She is unable to specify what precipitates her chest pain. She denies any fever, cough. She states that the chest pain can occur at any time, even at rest. Prior history of intermittent dysphagia to solids and liquids. Recent upper endoscopy 03/19/16  showed grade C ulcerative reflux type esophagitis with surrounding edema. Patient denies any episodes of hematemesis, melena.  In the ER EKG shows nonspecific ST-T segment changes in the anterolateral leads, troponin I 0.73, potassium 2.9. Hemoglobin 10.6. Chest x-ray atelectasis vs infiltrate in the left lower lobe. No evidence of CHF.      Review of Systems: negative for the following  Constitutional: Denies fever, chills, diaphoresis, appetite change and fatigue.  HEENT: Denies photophobia, eye pain, redness, hearing loss, ear pain, congestion, sore throat, rhinorrhea, sneezing, mouth sores, trouble swallowing, neck pain, neck stiffness and tinnitus.  Respiratory: Denies SOB, DOE, cough, chest tightness, and wheezing.  Cardiovascular: Positive for chest pain, palpitations and leg swelling.  Gastrointestinal: Denies nausea, vomiting, abdominal pain, diarrhea, constipation, blood in stool and abdominal distention.  Genitourinary: Denies dysuria, urgency, frequency, hematuria, flank pain and difficulty urinating.  Musculoskeletal:  Denies myalgias, back pain, joint swelling, arthralgias and gait problem.  Skin: Denies pallor, rash and wound.  Neurological: Denies dizziness, seizures, syncope, weakness, light-headedness, numbness and headaches.  Hematological: Denies adenopathy. Easy bruising, personal or family bleeding history  Psychiatric/Behavioral: Denies suicidal ideation, mood changes, confusion, nervousness, sleep disturbance and agitation       Past Medical History:  Diagnosis Date  . Allergic rhinitis, cause unspecified 12/08/2010  . ANEMIA-IRON DEFICIENCY 01/25/2008  . BACK PAIN 05/02/2009  . CERVICAL RADICULOPATHY, LEFT 01/25/2008  . Critical lower limb ischemia   . DIABETES MELLITUS, UNCONTROLLED 05/21/2009  . Foot ulcer ([place])    right lateral malleolus  . HYPERLIPIDEMIA 03/30/2007  . HYPERTENSION 01/25/2008  . NUMBNESS 05/21/2009  . RENAL INSUFFICIENCY 02/01/2008   HD pt.  . SECONDARY HYPERPARATHYROIDISM 05/21/2009  . SINUSITIS- ACUTE-NOS 01/25/2008     Past Surgical History:  Procedure Laterality Date  . AMPUTATION Right 03/15/2014   Procedure: RIGHT  LEG  BELOW KNEE AMPUTATION ;  Surgeon: [name], MD;  Location: [place];  Service: Orthopedics;  Laterality: Right;  . AMPUTATION Left 11/29/2015   Procedure: AMPUTATION BELOW KNEE;  Surgeon: Newt Minion, MD;  Location: [place];  Service: Orthopedics;  Laterality: Left;  . AV FISTULA PLACEMENT Left   . AV FISTULA PLACEMENT Left 09/06/2014   Procedure: ARTERIOVENOUS (AV) FISTULA CREATION-Left Brachiocephalic;  Surgeon: Conrad St. Tammany, MD;  Location: [place];  Service: Vascular;  Laterality: Left;  . COLONOSCOPY    . ESOPHAGOGASTRODUODENOSCOPY N/A 03/20/2016   Procedure: ESOPHAGOGASTRODUODENOSCOPY (EGD);  Surgeon: Milus Banister, MD;  Location: [place];  Service: Endoscopy;  Laterality: N/A;  . EYE SURGERY Bilateral    cataracts removed, left eye still has some oil in it.  . INSERTION OF DIALYSIS CATHETER N/A 09/03/2014   Procedure:  INSERTION OF DIALYSIS  CATHETER RIGHT INTERNAL JUGULAR VEIN;  Surgeon: Conrad Bancroft, MD;  Location: [place];  Service: Vascular;  Laterality: N/A;  . PERIPHERAL VASCULAR CATHETERIZATION N/A 08/13/2015   Procedure: Abdominal Aortogram;  Surgeon: Elam Dutch, MD;  Location: [place] [facility];  Service: Cardiovascular;  Laterality: N/A;      Social History:  reports that she has never smoked. She has never used smokeless tobacco. She reports that she does not drink alcohol or use drugs.    Allergies  Allergen Reactions  . Oxycodone Itching    Family History  Problem Relation Age of Onset  . Cancer Mother     Pancreatic        Prior to Admission medications   Medication Sig Start Date End Date Taking? Authorizing Provider  acetaminophen (TYLENOL) 325 MG tablet Take 2 tablets (650 mg total) by mouth every 6 (six) hours as needed for mild pain, moderate pain or fever. 09/13/14  Yes Modena Jansky, MD  amLODipine (NORVASC) 10 MG tablet Take 1 tablet (10 mg total) by mouth daily. 12/31/15  Yes Biagio Borg, MD  aspirin 81 MG EC tablet Take 81 mg by mouth daily.     Yes Historical Provider, MD  [facility] ACID REDUCER 10 MG tablet TAKE 1 TABLET (10 MG TOTAL) BY MOUTH DAILY. 03/13/16  Yes Ivan Anchors Love, PA-C  gabapentin (NEURONTIN) 100 MG capsule Take 1 capsule by mouth 2 (two) times daily. 10/29/15  Yes Historical Provider, MD  glipiZIDE (GLUCOTROL XL) 10 MG 24 hr tablet Take 1 tablet (10 mg total) by mouth daily with breakfast. 03/12/16  Yes Biagio Borg, MD  [facility] DELICA LANCETS FINE MISC Use to help check blood sugars twice a day Dx e11.9 01/09/16  Yes Biagio Borg, MD  senna (SENOKOT) 8.6 MG TABS tablet Take 2 tablets (17.2 mg total) by mouth 2 (two) times daily before a meal. Patient taking differently: Take 2 tablets by mouth daily as needed for mild constipation.  12/20/15  Yes Ivan Anchors Love, PA-C  sevelamer carbonate (RENVELA) 800 MG tablet Take 1,600 mg by mouth 3 (three) times daily with meals.   Yes  Historical Provider, MD  simvastatin (ZOCOR) 20 MG tablet Take 1 tablet (20 mg total) by mouth daily at 6 PM. 05/28/15  Yes Biagio Borg, MD  traZODone (DESYREL) 50 MG tablet TAKE 0.5-1 TABLETS (25-50 MG TOTAL) BY MOUTH AT BEDTIME AS NEEDED FOR SLEEP. 02/18/16  Yes Ankit Lorie Phenix, MD  furosemide (LASIX) 40 MG tablet TAKE 2 TABLETS BY MOUTH EVERY MORNING AND TAKE 1 TABLET EVERY EVENING Patient not taking: Reported on 06/29/2016 01/16/16   Biagio Borg, MD  glucose blood (ONETOUCH VERIO) test strip 1 each by Other route 2 (two) times daily. Use to check blood sugars twice a day Dx E11.9 Patient not taking: Reported on 06/29/2016 01/09/16   Biagio Borg, MD  oxyCODONE-acetaminophen (PERCOCET/ROXICET) 5-325 MG tablet Take 1 tablet by mouth every 6 (six) hours as needed for severe pain.    Newt Minion, MD  pantoprazole (PROTONIX) 40 MG tablet Take 1 tablet (40 mg total) by mouth 2 (two) times daily before a meal. Patient not taking: Reported on 06/29/2016 03/20/16   Theodis Blaze, MD     Physical Exam: Vitals:   06/29/16 1415 06/29/16 1500 06/29/16 1522 06/29/16 1530  BP: (!) 133/52 (!) 120/51  (!) 105/52  Pulse: 76 80  75  Resp: 12 18  18  Temp:      TempSrc:      SpO2: 99% 100%  99%  Weight:   50.8 kg (112 lb)   Height:   4\' 9"  (1.448 m)       Constitutional: NAD, calm, comfortable Vitals:   06/29/16 1415 06/29/16 1500 06/29/16 1522 06/29/16 1530  BP: (!) 133/52 (!) 120/51  (!) 105/52  Pulse: 76 80  75  Resp: 12 18  18   Temp:      TempSrc:      SpO2: 99% 100%  99%  Weight:   50.8 kg (112 lb)   Height:   4\' 9"  (1.448 m)    Eyes: PERRL, lids and conjunctivae normal ENMT: Mucous membranes are moist. Posterior pharynx clear of any exudate or lesions.Normal dentition.  Neck: normal, supple, no masses, no thyromegaly Respiratory: She has rales in the right lower field and the left lower field, no wheezing, no crackles. Normal respiratory effort. No accessory muscle use.   Cardiovascular: Regular rate and rhythm, no murmurs / rubs / gallops. No extremity edema.  . No carotid bruits.  Abdomen: no tenderness, no masses palpated. No hepatosplenomegaly. Bowel sounds positive.  Musculoskeletal: no clubbing / cyanosis.bilateral BKA. Good ROM, no contractures. Normal muscle tone.  Skin: no rashes, lesions, ulcers. No induration Neurologic: CN 2-12 grossly intact. Sensation intact, DTR normal. Strength 5/5 in all 4.  Psychiatric: Normal judgment and insight. Alert and oriented x 3. Normal mood.     Labs on Admission: I have personally reviewed following labs and imaging studies  CBC:  Recent Labs Lab 06/29/16 1341  WBC 4.6  NEUTROABS 2.2  HGB 10.6*  HCT 32.2*  MCV 88.7  PLT 601    Basic Metabolic Panel:  Recent Labs Lab 06/29/16 1341  NA 137  K 2.9*  CL 97*  CO2 27  GLUCOSE 120*  BUN 8  CREATININE 2.88*  CALCIUM 8.9    GFR: Estimated Creatinine Clearance: 13.6 mL/min (by C-G formula based on SCr of 2.88 mg/dL (H)).  Liver Function Tests:  Recent Labs Lab 06/29/16 1341  AST 16  ALT 7*  ALKPHOS 72  BILITOT 0.5  PROT 7.1  ALBUMIN 3.6   No results for input(s): LIPASE, AMYLASE in the last 168 hours. No results for input(s): AMMONIA in the last 168 hours.  Coagulation Profile: No results for input(s): INR, PROTIME in the last 168 hours. No results for input(s): DDIMER in the last 72 hours.  Cardiac Enzymes: No results for input(s): CKTOTAL, CKMB, CKMBINDEX, TROPONINI in the last 168 hours.  BNP (last 3 results) No results for input(s): PROBNP in the last 8760 hours.  HbA1C: No results for input(s): HGBA1C in the last 72 hours. Lab Results  Component Value Date   HGBA1C 8.3 03/12/2016   HGBA1C 11.5 (H) 05/28/2015   HGBA1C 9.8 (H) 02/26/2015     CBG: No results for input(s): GLUCAP in the last 168 hours.  Lipid Profile: No results for input(s): CHOL, HDL, LDLCALC, TRIG, CHOLHDL, LDLDIRECT in the last 72  hours.  Thyroid Function Tests: No results for input(s): TSH, T4TOTAL, FREET4, T3FREE, THYROIDAB in the last 72 hours.  Anemia Panel: No results for input(s): VITAMINB12, FOLATE, FERRITIN, TIBC, IRON, RETICCTPCT in the last 72 hours.  Urine analysis:    Component Value Date/Time   COLORURINE YELLOW 03/19/2016 0206   APPEARANCEUR CLOUDY (A) 03/19/2016 0206   LABSPEC 1.007 03/19/2016 0206   PHURINE 8.0 03/19/2016 0206   GLUCOSEU 250 (A) 03/19/2016 0206  GLUCOSEU >=1000 (A) 05/28/2015 1245   HGBUR SMALL (A) 03/19/2016 0206   BILIRUBINUR NEGATIVE 03/19/2016 0206   KETONESUR NEGATIVE 03/19/2016 0206   PROTEINUR >300 (A) 03/19/2016 0206   UROBILINOGEN 0.2 05/28/2015 1245   NITRITE NEGATIVE 03/19/2016 0206   LEUKOCYTESUR TRACE (A) 03/19/2016 0206    Sepsis Labs: @LABRCNTIP (procalcitonin:4,lacticidven:4) )No results found for this or any previous visit (from the past 240 hour(s)).       Radiological Exams on Admission: Dg Chest 2 View  Result Date: 06/29/2016 CLINICAL DATA:  Two weeks of chest pain and shortness of breath. History of hypertension, diabetes, nonsmoker. EXAM: CHEST  2 VIEW COMPARISON:  PA and lateral chest x-ray of December 11, 2015. FINDINGS: The lungs are reasonably well inflated the interstitial markings are mildly increased chronically. There are coarse retrocardiac lung markings on the left which are more conspicuous than in the past. The heart is top-normal in size. The pulmonary vascularity is not engorged. There is calcification in the wall of the aortic arch. There is no pleural effusion. The bony thorax exhibits no acute abnormality. There are chronic degenerative changes of the right glenohumeral joint. IMPRESSION: Atelectasis or infiltrate in the left lower lobe superimposed upon chronic fibrotic changes. No evidence of CHF. Followup PA and lateral chest X-ray is recommended in 3-4 weeks following trial of antibiotic therapy to ensure resolution and exclude  underlying malignancy. Electronically Signed   By: David  Martinique M.D.   On: 06/29/2016 13:02   Dg Chest 2 View  Result Date: 06/29/2016 CLINICAL DATA:  Two weeks of chest pain and shortness of breath. History of hypertension, diabetes, nonsmoker. EXAM: CHEST  2 VIEW COMPARISON:  PA and lateral chest x-ray of December 11, 2015. FINDINGS: The lungs are reasonably well inflated the interstitial markings are mildly increased chronically. There are coarse retrocardiac lung markings on the left which are more conspicuous than in the past. The heart is top-normal in size. The pulmonary vascularity is not engorged. There is calcification in the wall of the aortic arch. There is no pleural effusion. The bony thorax exhibits no acute abnormality. There are chronic degenerative changes of the right glenohumeral joint. IMPRESSION: Atelectasis or infiltrate in the left lower lobe superimposed upon chronic fibrotic changes. No evidence of CHF. Followup PA and lateral chest X-ray is recommended in 3-4 weeks following trial of antibiotic therapy to ensure resolution and exclude underlying malignancy. Electronically Signed   By: David  Martinique M.D.   On: 06/29/2016 13:02      EKG: Independently reviewed.  EKG Interpretation  Date/Time:                  Monday June 29 2016 12:21:41 EDT Ventricular Rate:   74 PR Interval:                        QRS Duration:        96 QT Interval:                      452 QTC Calculation:    502 R Axis:                         36 Text Interpretation:  Sinus rhythm Probable left atrial enlargement Borderline repolarization abnormality Prolonged QT interval no significant change since July 2017   Assessment/Plan Healthcare associated pneumonia Patient has been started on vancomycin/cefepime Monday Wednesday Friday with dialysis We'll check pro-calcitonin, if  within normal limits, transitioned to by mouth antibiotics early  Abnormal troponin-concerning for unstable angina given  multiple cardiovascular risk factors Cardiology has been consulted, will continue to cycle cardiac enzymes 2-D echo to rule out wall motion abnormalities Aspirin 81 mg a day Patient has had no prior ischemia workup    Hyperlipidemia-continue statin    Essential hypertension -continue outpatient medications    S/P bilateral  BKA (below knee amputation) (HCC)-noted    Type 2 diabetes mellitus with ESRD (-patient has been started on sliding scale insulin and Accu-Cheks    Anemia of renal disease-baseline  hemoglobin 10.5, stable    ESRD needing dialysis (HCC)-EDP has notified Dr. [facility] Monday Wednesday Friday       HCAP (healthcare-associated pneumonia)  History of esophagitis-will continue with PPI, chest pain could be related to esophageal issues, will order speech therapy evaluation to rule out aspiration     Hypokalemia-replete     DVT prophylaxis:  Heparin     Code Status Orders Full code         Family Communication: discussed with patient's Sister  By the bedside   Disposition Plan:  Anticipate discharge in 2-3 days pending further workup and clinical progress  Consults called: Cardiology/nephrology  Admission status: Observation  Total time spent 55 minutes.Greater than 50% of this time was spent in counseling, explanation of diagnosis, planning of further management, and coordination of care  [facility] MD [facility] Pager [phone]  If 7PM-7AM, please contact night-coverage [email or web address] Password [email or web address]  06/29/2016, 4:06 PM

## 2016-06-29 NOTE — ED Provider Notes (Signed)
[facility] DEPT Provider Note   CSN: [id number] Arrival date & time: 06/29/16  1206     History   Chief Complaint Chief Complaint  Patient presents with  . Shortness of Breath    HPI ESTELITA ITEN is a 65 y.o. female.  HPI  65 year old female presents with shortness of breath and chest pain. Has been ongoing for 2 weeks. Worse since yesterday. Had her dialysis this morning and completed a full session. Was told to come to the ER because of the nephrologist concern for pneumonia. No cough but she states she's been having sputum in her throat. Denies fevers. Sshe's also been having some bilateral shoulder pain over the last 2 days that is new. The chest pain comes and goes and is sharp. Not related to short of breath or movement or cough.  Past Medical History:  Diagnosis Date  . Allergic rhinitis, cause unspecified 12/08/2010  . ANEMIA-IRON DEFICIENCY 01/25/2008  . BACK PAIN 05/02/2009  . CERVICAL RADICULOPATHY, LEFT 01/25/2008  . Critical lower limb ischemia   . DIABETES MELLITUS, UNCONTROLLED 05/21/2009  . Foot ulcer ([place])    right lateral malleolus  . HYPERLIPIDEMIA 03/30/2007  . HYPERTENSION 01/25/2008  . NUMBNESS 05/21/2009  . RENAL INSUFFICIENCY 02/01/2008   HD pt.  . SECONDARY HYPERPARATHYROIDISM 05/21/2009  . SINUSITIS- ACUTE-NOS 01/25/2008    Patient Active Problem List   Diagnosis Date Noted  . Nausea and vomiting 03/18/2016  . Sepsis ([place]) due to UTI 12/20/2015  . Acute lower UTI   . Labile blood pressure   . Hyperglycemia   . SIRS (systemic inflammatory response syndrome) (HCC)   . Fever   . Hypoglycemia associated with diabetes ([place])   . Leukocytosis   . Urinary retention   . Dysphagia   . Unilateral complete BKA ([place]) 12/04/2015  . S/P bilateral BKA (below knee amputation) ([place])   . Abnormality of gait   . Muscle spasm   . ESRD on dialysis ([place])   . Type 2 diabetes mellitus with diabetic peripheral angiopathy and gangrene, without long-term current use  of insulin ([place])   . Post-operative pain   . Benign essential HTN   . Metabolic bone disease   . Hypoxia   . Type 2 diabetes mellitus with peripheral neuropathy (HCC)   . Labile blood glucose   . End stage renal disease ([place])   . Postoperative pain   . Acute blood loss anemia   . Anemia of chronic disease   . Below knee amputation status ([place]) 11/29/2015  . Odynophagia 11/12/2015  . Chest pain, atypical 06/06/2015  . Chest pain 05/28/2015  . Ingrown nail 05/28/2015  . Acute pulmonary edema (HCC)   . FUO (fever of unknown origin)   . HCAP (healthcare-associated pneumonia)   . ESRD needing dialysis ([place])   . Type 2 diabetes mellitus with ESRD (end-stage renal disease) ([place]) 09/01/2014  . Anemia of renal disease 09/01/2014  . Venous insufficiency of left leg 08/27/2014  . Rash 07/06/2014  . S/P BKA (below knee amputation) ([place]) 03/20/2014  . 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 Critical lower limb ischemia 01/09/2014  . Right foot ulcer ([place]) 11/07/2013  . Toe pain 09/06/2013  . Pre-ulcerative corn or callous 09/06/2013  . Lower extremity pain 09/06/2013  . Left low back pain 12/08/2011  . Hip bursitis, left 12/08/2011  . Left leg pain 12/08/2011  . Abdominal pain, other specified site 07/08/2011  . Preventative health care 12/08/2010  . Allergic  rhinitis, cause unspecified 12/08/2010  . SECONDARY HYPERPARATHYROIDISM 05/21/2009  . NUMBNESS 05/21/2009  . BACK PAIN 05/02/2009  . ANEMIA-IRON DEFICIENCY 01/25/2008  . Essential hypertension 01/25/2008  . CERVICAL RADICULOPATHY, LEFT 01/25/2008  . Hyperlipidemia 03/30/2007    Past Surgical History:  Procedure Laterality Date  . AMPUTATION Right 03/15/2014   Procedure: RIGHT  LEG  BELOW KNEE AMPUTATION ;  Surgeon: [name], MD;  Location: [facility];  Service: Orthopedics;  Laterality: Right;  . AMPUTATION Left 11/29/2015   Procedure: AMPUTATION BELOW KNEE;  Surgeon: Newt Minion, MD;  Location: [facility];   Service: Orthopedics;  Laterality: Left;  . AV FISTULA PLACEMENT Left   . AV FISTULA PLACEMENT Left 09/06/2014   Procedure: ARTERIOVENOUS (AV) FISTULA CREATION-Left Brachiocephalic;  Surgeon: Conrad Kermit, MD;  Location: [facility];  Service: Vascular;  Laterality: Left;  . COLONOSCOPY    . ESOPHAGOGASTRODUODENOSCOPY N/A 03/20/2016   Procedure: ESOPHAGOGASTRODUODENOSCOPY (EGD);  Surgeon: Milus Banister, MD;  Location: [facility];  Service: Endoscopy;  Laterality: N/A;  . EYE SURGERY Bilateral    cataracts removed, left eye still has some oil in it.  . INSERTION OF DIALYSIS CATHETER N/A 09/03/2014   Procedure: INSERTION OF DIALYSIS CATHETER RIGHT INTERNAL JUGULAR VEIN;  Surgeon: Conrad Sevier, MD;  Location: [facility];  Service: Vascular;  Laterality: N/A;  . PERIPHERAL VASCULAR CATHETERIZATION N/A 08/13/2015   Procedure: Abdominal Aortogram;  Surgeon: Elam Dutch, MD;  Location: [facility];  Service: Cardiovascular;  Laterality: N/A;    OB History    No data available       Home Medications    Prior to Admission medications   Medication Sig Start Date End Date Taking? Authorizing Provider  acetaminophen (TYLENOL) 325 MG tablet Take 2 tablets (650 mg total) by mouth every 6 (six) hours as needed for mild pain, moderate pain or fever. 09/13/14  Yes Modena Jansky, MD  amLODipine (NORVASC) 10 MG tablet Take 1 tablet (10 mg total) by mouth daily. 12/31/15  Yes Biagio Borg, MD  aspirin 81 MG EC tablet Take 81 mg by mouth daily.     Yes Historical Provider, MD  [employer] ACID REDUCER 10 MG tablet TAKE 1 TABLET (10 MG TOTAL) BY MOUTH DAILY. 03/13/16  Yes Ivan Anchors Love, PA-C  gabapentin (NEURONTIN) 100 MG capsule Take 1 capsule by mouth 2 (two) times daily. 10/29/15  Yes Historical Provider, MD  glipiZIDE (GLUCOTROL XL) 10 MG 24 hr tablet Take 1 tablet (10 mg total) by mouth daily with breakfast. 03/12/16  Yes Biagio Borg, MD  [facility] DELICA LANCETS FINE MISC Use to help check blood sugars twice a  day Dx e11.9 01/09/16  Yes Biagio Borg, MD  senna (SENOKOT) 8.6 MG TABS tablet Take 2 tablets (17.2 mg total) by mouth 2 (two) times daily before a meal. Patient taking differently: Take 2 tablets by mouth daily as needed for mild constipation.  12/20/15  Yes Ivan Anchors Love, PA-C  sevelamer carbonate (RENVELA) 800 MG tablet Take 1,600 mg by mouth 3 (three) times daily with meals.   Yes Historical Provider, MD  simvastatin (ZOCOR) 20 MG tablet Take 1 tablet (20 mg total) by mouth daily at 6 PM. 05/28/15  Yes Biagio Borg, MD  traZODone (DESYREL) 50 MG tablet TAKE 0.5-1 TABLETS (25-50 MG TOTAL) BY MOUTH AT BEDTIME AS NEEDED FOR SLEEP. 02/18/16  Yes Ankit Lorie Phenix, MD  furosemide (LASIX) 40 MG tablet TAKE 2 TABLETS BY MOUTH EVERY  MORNING AND TAKE 1 TABLET EVERY EVENING Patient not taking: Reported on 06/29/2016 01/16/16   Biagio Borg, MD  glucose blood [facility] VERIO) test strip 1 each by Other route 2 (two) times daily. Use to check blood sugars twice a day Dx E11.9 Patient not taking: Reported on 06/29/2016 01/09/16   Biagio Borg, MD  oxyCODONE-acetaminophen (PERCOCET/ROXICET) 5-325 MG tablet Take 1 tablet by mouth every 6 (six) hours as needed for severe pain.    Newt Minion, MD  pantoprazole (PROTONIX) 40 MG tablet Take 1 tablet (40 mg total) by mouth 2 (two) times daily before a meal. Patient not taking: Reported on 06/29/2016 03/20/16   Theodis Blaze, MD    Family History Family History  Problem Relation Age of Onset  . Cancer Mother     Pancreatic    Social History Social History  Substance Use Topics  . Smoking status: Never Smoker  . Smokeless tobacco: Never Used  . Alcohol use No     Allergies   Oxycodone   Review of Systems Review of Systems  Constitutional: Negative for fever.  HENT: Positive for congestion.   Respiratory: Positive for shortness of breath. Negative for cough.   Cardiovascular: Positive for chest pain.  All other systems reviewed and are  negative.    Physical Exam Updated Vital Signs BP (!) 105/52   Pulse 75   Temp 98.1 F (36.7 C) (Oral)   Resp 18   Ht 4\' 9"  (1.448 m)   Wt 112 lb (50.8 kg)   SpO2 99%   BMI 24.24 kg/m   Physical Exam  Constitutional: She is oriented to person, place, and time. She appears well-developed and well-nourished.  HENT:  Head: Normocephalic and atraumatic.  Right Ear: External ear normal.  Left Ear: External ear normal.  Nose: Nose normal.  Eyes: Right eye exhibits no discharge. Left eye exhibits no discharge.  Cardiovascular: Normal rate, regular rhythm and normal heart sounds.   Pulmonary/Chest: Effort normal. No accessory muscle usage. No tachypnea. She has rales in the right lower field and the left lower field.  Abdominal: Soft. She exhibits no distension. There is no tenderness.  Musculoskeletal:  Bilateral BKA  Neurological: She is alert and oriented to person, place, and time.  Skin: Skin is warm and dry.  Nursing note and vitals reviewed.    ED Treatments / Results  Labs (all labs ordered are listed, but only abnormal results are displayed) Labs Reviewed  COMPREHENSIVE METABOLIC PANEL - Abnormal; Notable for the following:       Result Value   Potassium 2.9 (*)    Chloride 97 (*)    Glucose, Bld 120 (*)    Creatinine, Ser 2.88 (*)    ALT 7 (*)    GFR calc non Af Amer 16 (*)    GFR calc Af Amer 19 (*)    All other components within normal limits  CBC WITH DIFFERENTIAL/PLATELET - Abnormal; Notable for the following:    RBC 3.63 (*)    Hemoglobin 10.6 (*)    HCT 32.2 (*)    RDW 17.4 (*)    All other components within normal limits  I-STAT TROPOININ, ED - Abnormal; Notable for the following:    Troponin i, poc 0.73 (*)    All other components within normal limits  CULTURE, BLOOD (ROUTINE X 2)  CULTURE, BLOOD (ROUTINE X 2)  CULTURE, BLOOD (ROUTINE X 2)  CULTURE, BLOOD (ROUTINE X 2)  CULTURE, EXPECTORATED SPUTUM-ASSESSMENT  GRAM STAIN  HIV ANTIBODY  (ROUTINE TESTING)  STREP PNEUMONIAE URINARY ANTIGEN  INFLUENZA PANEL BY PCR (TYPE A & B, H1N1)  LEGIONELLA PNEUMOPHILA SEROGP 1 UR AG  I-STAT CG4 LACTIC ACID, ED    EKG  EKG Interpretation  Date/Time:  Monday June 29 2016 12:21:41 EDT Ventricular Rate:  74 PR Interval:    QRS Duration: 96 QT Interval:  452 QTC Calculation: 502 R Axis:   36 Text Interpretation:  Sinus rhythm Probable left atrial enlargement Borderline repolarization abnormality Prolonged QT interval no significant change since July 2017 Confirmed by Regenia Skeeter MD, [place] [phone]) on 06/29/2016 12:57:43 PM       Radiology Dg Chest 2 View  Result Date: 06/29/2016 CLINICAL DATA:  Two weeks of chest pain and shortness of breath. History of hypertension, diabetes, nonsmoker. EXAM: CHEST  2 VIEW COMPARISON:  PA and lateral chest x-ray of December 11, 2015. FINDINGS: The lungs are reasonably well inflated the interstitial markings are mildly increased chronically. There are coarse retrocardiac lung markings on the left which are more conspicuous than in the past. The heart is top-normal in size. The pulmonary vascularity is not engorged. There is calcification in the wall of the aortic arch. There is no pleural effusion. The bony thorax exhibits no acute abnormality. There are chronic degenerative changes of the right glenohumeral joint. IMPRESSION: Atelectasis or infiltrate in the left lower lobe superimposed upon chronic fibrotic changes. No evidence of CHF. Followup PA and lateral chest X-ray is recommended in 3-4 weeks following trial of antibiotic therapy to ensure resolution and exclude underlying malignancy. Electronically Signed   By: David  Martinique M.D.   On: 06/29/2016 13:02    Procedures Procedures (including critical care time)  Medications Ordered in ED Medications  vancomycin (VANCOCIN) IVPB 1000 mg/200 mL premix (1,000 mg Intravenous New Bag/Given 06/29/16 1516)  ceFEPIme (MAXIPIME) 2 g in dextrose 5 % 50 mL IVPB  (not administered)  vancomycin (VANCOCIN) 500 mg in sodium chloride 0.9 % 100 mL IVPB (not administered)  potassium chloride SA (K-DUR,KLOR-CON) CR tablet 40 mEq (not administered)  ceFEPIme (MAXIPIME) 2 g in dextrose 5 % 50 mL IVPB (0 g Intravenous Stopped 06/29/16 1515)     Initial Impression / Assessment and Plan / ED Course  I have reviewed the triage vital signs and the nursing notes.  Pertinent labs & imaging results that were available during my care of the patient were reviewed by me and considered in my medical decision making (see chart for details).  Clinical Course  Comment By Time  Overall appears ok, not significantly dyspneic at rest. Will check labs, including lactate and blood cultures. IV antibiotics. Depending on labs and arrangement of follow up she could potentially be treated as an outpatient.  Sherwood Gambler, MD 10/30 1327  Dr. Allyson Sabal to admit Sherwood Gambler, MD 10/30 1525    Patient's chest pain is atypical. While she does have renal disease or take her troponin is a little higher than I would expect. Thus will admit for serial enzymes and PNA treatment. Dr. Allyson Sabal to admit and place orders.  Final Clinical Impressions(s) / ED Diagnoses   Final diagnoses:  HCAP (healthcare-associated pneumonia)  Elevated troponin    New Prescriptions New Prescriptions   No medications on file     Sherwood Gambler, MD 06/29/16 1652

## 2016-06-29 NOTE — ED Notes (Signed)
Patient transported to X-ray 

## 2016-06-29 NOTE — ED Triage Notes (Signed)
Pt from dialysis center with c/o intermittent exertional SOB x 2 weeks.  Pt additionally reports having CP during the past 2 weeks as well but denies CP today.  Pt had fully dialysis session today.  12 lead unremarkable.  Lungs bilaterally clear.  NAD, requesting to eat her lunch she has with her, A&Ox4.

## 2016-06-29 NOTE — Progress Notes (Signed)
Pharmacy Antibiotic Note  Carolyn Valenzuela is a 65 y.o. female admitted on 06/29/2016 with pneumonia.  Pharmacy has been consulted for vancomycin and cefepime dosing. CC SOB x 2 weeks, also CP for same span but no CP today. Has HD qMWF. Had full session today. No ABX given at HD center.  Plan: Vancomycin 1000 mg IV x1 then 500 mg qHD Cefepime 2g In qHD Monitor clinical course      Temp (24hrs), Avg:98.1 F (36.7 C), Min:98.1 F (36.7 C), Max:98.1 F (36.7 C)  No results for input(s): WBC, CREATININE, LATICACIDVEN, VANCOTROUGH, VANCOPEAK, VANCORANDOM, GENTTROUGH, GENTPEAK, GENTRANDOM, TOBRATROUGH, TOBRAPEAK, TOBRARND, AMIKACINPEAK, AMIKACINTROU, AMIKACIN in the last 168 hours.  CrCl cannot be calculated (Unknown ideal weight.).   ESRD HD qMWF  Allergies  Allergen Reactions  . Oxycodone Itching    Antimicrobials this admission:  Vancomycin 10/30 >> Cefepime 10/30 >>  Dose adjustments this admission:  N/A  Microbiology results:  10/30 BCx: Sent  Thank you for allowing pharmacy to be a part of this patient's care.  Cheral Almas, PharmD Candidate 06/29/2016 1:42 PM

## 2016-06-29 NOTE — ED Notes (Signed)
Cardiology at bedside.

## 2016-06-29 NOTE — Progress Notes (Signed)
ANTICOAGULATION CONSULT NOTE - Initial Consult  Pharmacy Consult for heparin Indication: chest pain/ACS  Allergies  Allergen Reactions  . Oxycodone Itching    Patient Measurements: Height: 4\' 9"  (144.8 cm) Weight: 112 lb (50.8 kg) IBW/kg (Calculated) : 38.6 Heparin Dosing Weight: 50.8 kg  Vital Signs: Temp: 98.1 F (36.7 C) (10/30 1222) Temp Source: Oral (10/30 1222) BP: 114/56 (10/30 1615) Pulse Rate: 75 (10/30 1615)  Labs:  Recent Labs  06/29/16 1341  HGB 10.6*  HCT 32.2*  PLT 182  CREATININE 2.88*    Estimated Creatinine Clearance: 13.6 mL/min (by C-G formula based on SCr of 2.88 mg/dL (H)).  ESRD   Medical History: Past Medical History:  Diagnosis Date  . Allergic rhinitis, cause unspecified 12/08/2010  . ANEMIA-IRON DEFICIENCY 01/25/2008  . BACK PAIN 05/02/2009  . CERVICAL RADICULOPATHY, LEFT 01/25/2008  . Critical lower limb ischemia   . DIABETES MELLITUS, UNCONTROLLED 05/21/2009  . Foot ulcer ([place])    right lateral malleolus  . HYPERLIPIDEMIA 03/30/2007  . HYPERTENSION 01/25/2008  . NUMBNESS 05/21/2009  . RENAL INSUFFICIENCY 02/01/2008   HD pt.  . SECONDARY HYPERPARATHYROIDISM 05/21/2009  . SINUSITIS- ACUTE-NOS 01/25/2008    Medications:   (Not in a hospital admission)  Assessment: Heparin for ACS/STEMI. Pt not on any PTA anticoagulation. H/H low @ 10.6/32.3, Plt WNL. No bleeding reported. SOB and CP over past two weeks.  Goal of Therapy:  Heparin level 0.3-0.7 units/ml Monitor platelets by anticoagulation protocol: Yes   Plan:  Heparin 3000 units IV bolus x1 then 600 units/hr infusion Heparin level @ 0200 Daily CBC and heparin level Monitor for s/sx bleeding  Cheral Almas, PharmD Candidate 06/29/2016,4:50 PM

## 2016-06-29 NOTE — ED Notes (Signed)
Phlebotomy at bedside.

## 2016-06-29 NOTE — ED Notes (Addendum)
Dr. Allyson Sabal paged regarding potassium. MD states okay to crush 20 mEq tablet and put it in applesauce.

## 2016-06-29 NOTE — Consult Note (Signed)
Primary cardiologist: Dr Gwenlyn Found  HPI: 65 year old female for evaluation of chest pain. Echocardiogram January 2016 showed normal LV systolic function, grade 1 diastolic dysfunction, moderate mitral regurgitation. Patient does not have a prior cardiac history but does have a history of peripheral vascular disease. Over the preceding 2 weeks she noticed progressive dyspnea on exertion. No orthopnea, PND, edema or syncope. She has had occasional chest pain. It is in the left breast area and described as a sharp pain. It lasts 1 minute and resolves. No radiation. It is not exertional. She presented to the emergency room and troponin elevated. Cardiology asked to evaluate.   (Not in a hospital admission)  Allergies  Allergen Reactions  . Oxycodone Itching    Past Medical History:  Diagnosis Date  . Allergic rhinitis, cause unspecified 12/08/2010  . ANEMIA-IRON DEFICIENCY 01/25/2008  . BACK PAIN 05/02/2009  . CERVICAL RADICULOPATHY, LEFT 01/25/2008  . Critical lower limb ischemia   . DIABETES MELLITUS, UNCONTROLLED 05/21/2009  . Foot ulcer ([place])    right lateral malleolus  . HYPERLIPIDEMIA 03/30/2007  . HYPERTENSION 01/25/2008  . NUMBNESS 05/21/2009  . RENAL INSUFFICIENCY 02/01/2008   HD pt.  . SECONDARY HYPERPARATHYROIDISM 05/21/2009  . SINUSITIS- ACUTE-NOS 01/25/2008    Past Surgical History:  Procedure Laterality Date  . AMPUTATION Right 03/15/2014   Procedure: RIGHT  LEG  BELOW KNEE AMPUTATION ;  Surgeon: [name], MD;  Location: [facility];  Service: Orthopedics;  Laterality: Right;  . AMPUTATION Left 11/29/2015   Procedure: AMPUTATION BELOW KNEE;  Surgeon: Newt Minion, MD;  Location: [facility];  Service: Orthopedics;  Laterality: Left;  . AV FISTULA PLACEMENT Left   . AV FISTULA PLACEMENT Left 09/06/2014   Procedure: ARTERIOVENOUS (AV) FISTULA CREATION-Left Brachiocephalic;  Surgeon: Conrad Fairway, MD;  Location: [facility];  Service: Vascular;  Laterality: Left;  . COLONOSCOPY    .  ESOPHAGOGASTRODUODENOSCOPY N/A 03/20/2016   Procedure: ESOPHAGOGASTRODUODENOSCOPY (EGD);  Surgeon: Milus Banister, MD;  Location: [facility];  Service: Endoscopy;  Laterality: N/A;  . EYE SURGERY Bilateral    cataracts removed, left eye still has some oil in it.  . INSERTION OF DIALYSIS CATHETER N/A 09/03/2014   Procedure: INSERTION OF DIALYSIS CATHETER RIGHT INTERNAL JUGULAR VEIN;  Surgeon: Conrad Hurdsfield, MD;  Location: [facility];  Service: Vascular;  Laterality: N/A;  . PERIPHERAL VASCULAR CATHETERIZATION N/A 08/13/2015   Procedure: Abdominal Aortogram;  Surgeon: Elam Dutch, MD;  Location: [facility];  Service: Cardiovascular;  Laterality: N/A;    Social History   Social History  . Marital status: Single    Spouse name: N/A  . Number of children: N/A  . Years of education: N/A   Occupational History  . [place] History Main Topics  . Smoking status: Never Smoker  . Smokeless tobacco: Never Used  . Alcohol use No  . Drug use: No  . Sexual activity: Not on file   Other Topics Concern  . Not on file   Social History Narrative  . No narrative on file    Family History  Problem Relation Age of Onset  . Cancer Mother     Pancreatic    ROS:  Mildly productive cough but no fevers or chills, hemoptysis, dysphasia, odynophagia, melena, hematochezia, dysuria, hematuria, rash, seizure activity, orthopnea, PND. Remaining systems are negative.  Physical Exam:   Blood pressure (!) 105/52, pulse 75, temperature 98.1 F (36.7 C), temperature source Oral, resp. rate  18, height '4\' 9"'  (1.448 m), weight 112 lb (50.8 kg), SpO2 99 %.  General:  Well developed/chronically ill appearing in NAD Skin warm/dry Patient not depressed No peripheral clubbing Back-normal HEENT-normal/normal eyelids Neck supple/normal carotid upstroke bilaterally; no bruits; no JVD; no thyromegaly chest - CTA/ normal expansion CV - RRR/normal S1 and S2; no murmurs,  rubs or gallops;  PMI nondisplaced Abdomen -NT/ND, no HSM, no mass, + bowel sounds, no bruit 1+ femoral pulses, no bruits; 1+ right radial pulse Ext-status post bilateral below the knee amputation. Neuro-grossly nonfocal  ECG - Sinus rhythm, nonspecific ST changes, prolonged QT interval  Results for orders placed or performed during the hospital encounter of 06/29/16 (from the past 48 hour(s))  Comprehensive metabolic panel     Status: Abnormal   Collection Time: 06/29/16  1:41 PM  Result Value Ref Range   Sodium 137 135 - 145 mmol/L   Potassium 2.9 (L) 3.5 - 5.1 mmol/L   Chloride 97 (L) 101 - 111 mmol/L   CO2 27 22 - 32 mmol/L   Glucose, Bld 120 (H) 65 - 99 mg/dL   BUN 8 6 - 20 mg/dL   Creatinine, Ser 2.88 (H) 0.44 - 1.00 mg/dL   Calcium 8.9 8.9 - 10.3 mg/dL   Total Protein 7.1 6.5 - 8.1 g/dL   Albumin 3.6 3.5 - 5.0 g/dL   AST 16 15 - 41 U/L   ALT 7 (L) 14 - 54 U/L   Alkaline Phosphatase 72 38 - 126 U/L   Total Bilirubin 0.5 0.3 - 1.2 mg/dL   GFR calc non Af Amer 16 (L) >60 mL/min   GFR calc Af Amer 19 (L) >60 mL/min    Comment: (NOTE) The eGFR has been calculated using the CKD EPI equation. This calculation has not been validated in all clinical situations. eGFR's persistently <60 mL/min signify possible Chronic Kidney Disease.    Anion gap 13 5 - 15  CBC with Differential     Status: Abnormal   Collection Time: 06/29/16  1:41 PM  Result Value Ref Range   WBC 4.6 4.0 - 10.5 K/uL   RBC 3.63 (L) 3.87 - 5.11 MIL/uL   Hemoglobin 10.6 (L) 12.0 - 15.0 g/dL   HCT 32.2 (L) 36.0 - 46.0 %   MCV 88.7 78.0 - 100.0 fL   MCH 29.2 26.0 - 34.0 pg   MCHC 32.9 30.0 - 36.0 g/dL   RDW 17.4 (H) 11.5 - 15.5 %   Platelets 182 150 - 400 K/uL   Neutrophils Relative % 48 %   Neutro Abs 2.2 1.7 - 7.7 K/uL   Lymphocytes Relative 42 %   Lymphs Abs 1.9 0.7 - 4.0 K/uL   Monocytes Relative 8 %   Monocytes Absolute 0.4 0.1 - 1.0 K/uL   Eosinophils Relative 1 %   Eosinophils Absolute 0.1 0.0 -  0.7 K/uL   Basophils Relative 1 %   Basophils Absolute 0.1 0.0 - 0.1 K/uL  I-stat troponin, ED     Status: Abnormal   Collection Time: 06/29/16  1:49 PM  Result Value Ref Range   Troponin i, poc 0.73 (HH) 0.00 - 0.08 ng/mL   Comment NOTIFIED PHYSICIAN    Comment 3            Comment: Due to the release kinetics of cTnI, a negative result within the first hours of the onset of symptoms does not rule out myocardial infarction with certainty. If myocardial infarction is still suspected, repeat the test  at appropriate intervals.   I-Stat CG4 Lactic Acid, ED     Status: None   Collection Time: 06/29/16  1:52 PM  Result Value Ref Range   Lactic Acid, Venous 1.31 0.5 - 1.9 mmol/L    Dg Chest 2 View  Result Date: 06/29/2016 CLINICAL DATA:  Two weeks of chest pain and shortness of breath. History of hypertension, diabetes, nonsmoker. EXAM: CHEST  2 VIEW COMPARISON:  PA and lateral chest x-ray of December 11, 2015. FINDINGS: The lungs are reasonably well inflated the interstitial markings are mildly increased chronically. There are coarse retrocardiac lung markings on the left which are more conspicuous than in the past. The heart is top-normal in size. The pulmonary vascularity is not engorged. There is calcification in the wall of the aortic arch. There is no pleural effusion. The bony thorax exhibits no acute abnormality. There are chronic degenerative changes of the right glenohumeral joint. IMPRESSION: Atelectasis or infiltrate in the left lower lobe superimposed upon chronic fibrotic changes. No evidence of CHF. Followup PA and lateral chest X-ray is recommended in 3-4 weeks following trial of antibiotic therapy to ensure resolution and exclude underlying malignancy. Electronically Signed   By: David  Martinique M.D.   On: 06/29/2016 13:02    Assessment/Plan 1 non-ST elevation myocardial infarction-patient presents with progressive dyspnea on exertion and atypical chest pain. Her troponin is  elevated but is in the setting of end-stage renal disease. We'll continue to cycle enzymes to look for trend. Regardless she has multiple risk factors including greater than 20 years of diabetes mellitus and peripheral vascular disease. She needs definitive evaluation. Plan cardiac catheterization tomorrow. The risks and benefits including myocardial infarction, CVA and death discussed and she agrees to proceed. Would treat with aspirin, heparin and statin. Add low-dose metoprolol 12.5 mg twice a day.  2 hyperlipidemia-given documented vascular disease would discontinue Zocor and treated with Lipitor 80 mg daily. Check lipids and liver in 4 weeks.   3 hypertension-discontinue amlodipine and instead treat with Lopressor 12.5 mg twice a day in the setting of non-ST elevation myocardial infarction.  4 End-stage renal disease-dialysis per nephrology.   5 question pneumonia-clinically does not appear to have pneumonia. Management per primary care.     Kirk Ruths MD 06/29/2016, 3:42 PM

## 2016-06-30 ENCOUNTER — Encounter (HOSPITAL_COMMUNITY): Payer: Self-pay | Admitting: Cardiovascular Disease

## 2016-06-30 ENCOUNTER — Other Ambulatory Visit: Payer: Self-pay | Admitting: Internal Medicine

## 2016-06-30 ENCOUNTER — Encounter (HOSPITAL_COMMUNITY)
Admission: EM | Disposition: A | Discharge status: Home / Self Care | Payer: Self-pay | Source: Home / Self Care | Attending: Internal Medicine

## 2016-06-30 DIAGNOSIS — E1140 Type 2 diabetes mellitus with diabetic neuropathy, unspecified: Secondary | ICD-10-CM | POA: Diagnosis present

## 2016-06-30 DIAGNOSIS — N186 End stage renal disease: Secondary | ICD-10-CM | POA: Diagnosis not present

## 2016-06-30 DIAGNOSIS — E8889 Other specified metabolic disorders: Secondary | ICD-10-CM | POA: Diagnosis not present

## 2016-06-30 DIAGNOSIS — I959 Hypotension, unspecified: Secondary | ICD-10-CM | POA: Diagnosis not present

## 2016-06-30 DIAGNOSIS — I10 Essential (primary) hypertension: Secondary | ICD-10-CM | POA: Diagnosis not present

## 2016-06-30 DIAGNOSIS — I132 Hypertensive heart and chronic kidney disease with heart failure and with stage 5 chronic kidney disease, or end stage renal disease: Secondary | ICD-10-CM | POA: Diagnosis present

## 2016-06-30 DIAGNOSIS — Z951 Presence of aortocoronary bypass graft: Secondary | ICD-10-CM | POA: Diagnosis not present

## 2016-06-30 DIAGNOSIS — Z7982 Long term (current) use of aspirin: Secondary | ICD-10-CM | POA: Diagnosis not present

## 2016-06-30 DIAGNOSIS — N2581 Secondary hyperparathyroidism of renal origin: Secondary | ICD-10-CM | POA: Diagnosis present

## 2016-06-30 DIAGNOSIS — R7989 Other specified abnormal findings of blood chemistry: Secondary | ICD-10-CM

## 2016-06-30 DIAGNOSIS — R748 Abnormal levels of other serum enzymes: Secondary | ICD-10-CM | POA: Diagnosis not present

## 2016-06-30 DIAGNOSIS — Z79899 Other long term (current) drug therapy: Secondary | ICD-10-CM | POA: Diagnosis not present

## 2016-06-30 DIAGNOSIS — I214 Non-ST elevation (NSTEMI) myocardial infarction: Secondary | ICD-10-CM | POA: Diagnosis not present

## 2016-06-30 DIAGNOSIS — Z89511 Acquired absence of right leg below knee: Secondary | ICD-10-CM | POA: Diagnosis not present

## 2016-06-30 DIAGNOSIS — I351 Nonrheumatic aortic (valve) insufficiency: Secondary | ICD-10-CM | POA: Diagnosis not present

## 2016-06-30 DIAGNOSIS — E1129 Type 2 diabetes mellitus with other diabetic kidney complication: Secondary | ICD-10-CM | POA: Diagnosis not present

## 2016-06-30 DIAGNOSIS — E1122 Type 2 diabetes mellitus with diabetic chronic kidney disease: Secondary | ICD-10-CM | POA: Diagnosis not present

## 2016-06-30 DIAGNOSIS — I080 Rheumatic disorders of both mitral and aortic valves: Secondary | ICD-10-CM | POA: Diagnosis not present

## 2016-06-30 DIAGNOSIS — E1151 Type 2 diabetes mellitus with diabetic peripheral angiopathy without gangrene: Secondary | ICD-10-CM | POA: Diagnosis not present

## 2016-06-30 DIAGNOSIS — Z89512 Acquired absence of left leg below knee: Secondary | ICD-10-CM | POA: Diagnosis not present

## 2016-06-30 DIAGNOSIS — R0602 Shortness of breath: Secondary | ICD-10-CM | POA: Diagnosis not present

## 2016-06-30 DIAGNOSIS — I2510 Atherosclerotic heart disease of native coronary artery without angina pectoris: Secondary | ICD-10-CM | POA: Diagnosis not present

## 2016-06-30 DIAGNOSIS — E785 Hyperlipidemia, unspecified: Secondary | ICD-10-CM | POA: Diagnosis not present

## 2016-06-30 DIAGNOSIS — I120 Hypertensive chronic kidney disease with stage 5 chronic kidney disease or end stage renal disease: Secondary | ICD-10-CM | POA: Diagnosis not present

## 2016-06-30 DIAGNOSIS — Z992 Dependence on renal dialysis: Secondary | ICD-10-CM | POA: Diagnosis not present

## 2016-06-30 DIAGNOSIS — R778 Other specified abnormalities of plasma proteins: Secondary | ICD-10-CM

## 2016-06-30 DIAGNOSIS — E440 Moderate protein-calorie malnutrition: Secondary | ICD-10-CM | POA: Diagnosis not present

## 2016-06-30 DIAGNOSIS — I517 Cardiomegaly: Secondary | ICD-10-CM | POA: Diagnosis not present

## 2016-06-30 DIAGNOSIS — E876 Hypokalemia: Secondary | ICD-10-CM | POA: Diagnosis present

## 2016-06-30 DIAGNOSIS — J189 Pneumonia, unspecified organism: Secondary | ICD-10-CM | POA: Diagnosis not present

## 2016-06-30 DIAGNOSIS — Y95 Nosocomial condition: Secondary | ICD-10-CM | POA: Diagnosis not present

## 2016-06-30 DIAGNOSIS — E1165 Type 2 diabetes mellitus with hyperglycemia: Secondary | ICD-10-CM | POA: Diagnosis present

## 2016-06-30 DIAGNOSIS — I25110 Atherosclerotic heart disease of native coronary artery with unstable angina pectoris: Secondary | ICD-10-CM | POA: Diagnosis not present

## 2016-06-30 DIAGNOSIS — D631 Anemia in chronic kidney disease: Secondary | ICD-10-CM | POA: Diagnosis not present

## 2016-06-30 HISTORY — PX: CARDIAC CATHETERIZATION: SHX172

## 2016-06-30 LAB — COMPREHENSIVE METABOLIC PANEL
ALT: 7 U/L — ABNORMAL LOW (ref 14–54)
AST: 15 U/L (ref 15–41)
Albumin: 3 g/dL — ABNORMAL LOW (ref 3.5–5.0)
Alkaline Phosphatase: 64 U/L (ref 38–126)
Anion gap: 8 (ref 5–15)
BUN: 18 mg/dL (ref 6–20)
CO2: 29 mmol/L (ref 22–32)
Calcium: 8.2 mg/dL — ABNORMAL LOW (ref 8.9–10.3)
Chloride: 98 mmol/L — ABNORMAL LOW (ref 101–111)
Creatinine, Ser: 4.2 mg/dL — ABNORMAL HIGH (ref 0.44–1.00)
GFR calc Af Amer: 12 mL/min — ABNORMAL LOW (ref >60)
GFR calc non Af Amer: 10 mL/min — ABNORMAL LOW (ref >60)
Glucose, Bld: 125 mg/dL — ABNORMAL HIGH (ref 65–99)
Potassium: 3.4 mmol/L — ABNORMAL LOW (ref 3.5–5.1)
Sodium: 135 mmol/L (ref 135–145)
Total Bilirubin: 0.4 mg/dL (ref 0.3–1.2)
Total Protein: 6.1 g/dL — ABNORMAL LOW (ref 6.5–8.1)

## 2016-06-30 LAB — GLUCOSE, CAPILLARY
Glucose-Capillary: 82 mg/dL (ref 65–99)
Glucose-Capillary: 81 mg/dL (ref 65–99)
Glucose-Capillary: 91 mg/dL (ref 65–99)
Glucose-Capillary: 160 mg/dL — ABNORMAL HIGH (ref 65–99)
Glucose-Capillary: 114 mg/dL — ABNORMAL HIGH (ref 65–99)

## 2016-06-30 LAB — POCT ACTIVATED CLOTTING TIME: Activated Clotting Time: 114 seconds

## 2016-06-30 LAB — CBC
HCT: 29.5 % — ABNORMAL LOW (ref 36.0–46.0)
Hemoglobin: 9.7 g/dL — ABNORMAL LOW (ref 12.0–15.0)
MCH: 29 pg (ref 26.0–34.0)
MCHC: 32.9 g/dL (ref 30.0–36.0)
MCV: 88.1 fL (ref 78.0–100.0)
Platelets: 172 10*3/uL (ref 150–400)
RBC: 3.35 MIL/uL — ABNORMAL LOW (ref 3.87–5.11)
RDW: 17 % — ABNORMAL HIGH (ref 11.5–15.5)
WBC: 5.2 10*3/uL (ref 4.0–10.5)

## 2016-06-30 LAB — PROTIME-INR
INR: 1.06
Prothrombin Time: 13.9 seconds (ref 11.4–15.2)

## 2016-06-30 LAB — SURGICAL PCR SCREEN
MRSA, PCR: NEGATIVE
Staphylococcus aureus: NEGATIVE

## 2016-06-30 LAB — HIV ANTIBODY (ROUTINE TESTING W REFLEX): HIV Screen 4th Generation wRfx: NONREACTIVE

## 2016-06-30 LAB — TROPONIN I: Troponin I: 0.76 ng/mL (ref <0.03)

## 2016-06-30 LAB — HEPARIN LEVEL (UNFRACTIONATED): Heparin Unfractionated: 0.16 IU/mL — ABNORMAL LOW (ref 0.30–0.70)

## 2016-06-30 SURGERY — LEFT HEART CATH AND CORONARY ANGIOGRAPHY

## 2016-06-30 MED ORDER — HEPARIN BOLUS VIA INFUSION
1500.0000 [IU] | Freq: Once | INTRAVENOUS | Status: AC
  Administered 2016-06-30: 1500 [IU] via INTRAVENOUS
  Filled 2016-06-30: qty 1500

## 2016-06-30 MED ORDER — MIDAZOLAM HCL 2 MG/2ML IJ SOLN
INTRAMUSCULAR | Status: DC | PRN
  Administered 2016-06-30: 1 mg via INTRAVENOUS

## 2016-06-30 MED ORDER — DARBEPOETIN ALFA 100 MCG/0.5ML IJ SOSY
100.0000 ug | PREFILLED_SYRINGE | INTRAMUSCULAR | Status: DC
  Administered 2016-07-01: 100 ug via INTRAVENOUS
  Filled 2016-06-30: qty 0.5

## 2016-06-30 MED ORDER — SODIUM CHLORIDE 0.9% FLUSH
3.0000 mL | Freq: Two times a day (BID) | INTRAVENOUS | Status: DC
  Administered 2016-06-30: 3 mL via INTRAVENOUS

## 2016-06-30 MED ORDER — LIDOCAINE HCL (PF) 1 % IJ SOLN
INTRAMUSCULAR | Status: AC
  Filled 2016-06-30: qty 30

## 2016-06-30 MED ORDER — HEPARIN (PORCINE) IN NACL 2-0.9 UNIT/ML-% IJ SOLN
INTRAMUSCULAR | Status: AC
  Filled 2016-06-30: qty 1000

## 2016-06-30 MED ORDER — PRO-STAT SUGAR FREE PO LIQD
30.0000 mL | Freq: Two times a day (BID) | ORAL | Status: DC
  Administered 2016-07-01 – 2016-07-04 (×4): 30 mL via ORAL
  Filled 2016-06-30 (×6): qty 30

## 2016-06-30 MED ORDER — SODIUM CHLORIDE 0.9% FLUSH: 3.0000 mL | INTRAVENOUS | Status: DC | PRN

## 2016-06-30 MED ORDER — HEPARIN (PORCINE) IN NACL 100-0.45 UNIT/ML-% IJ SOLN
750.0000 [IU]/h | INTRAMUSCULAR | Status: DC
  Administered 2016-06-30: 750 [IU]/h via INTRAVENOUS
  Filled 2016-06-30: qty 250

## 2016-06-30 MED ORDER — MIDAZOLAM HCL 2 MG/2ML IJ SOLN
INTRAMUSCULAR | Status: AC
  Filled 2016-06-30: qty 2

## 2016-06-30 MED ORDER — SODIUM CHLORIDE 0.9% FLUSH
3.0000 mL | Freq: Two times a day (BID) | INTRAVENOUS | Status: DC
  Administered 2016-06-30 – 2016-07-02 (×4): 3 mL via INTRAVENOUS

## 2016-06-30 MED ORDER — SODIUM CHLORIDE 0.9 % IV SOLN: 250.0000 mL | INTRAVENOUS | Status: DC | PRN

## 2016-06-30 MED ORDER — DOXERCALCIFEROL 4 MCG/2ML IV SOLN
2.0000 ug | INTRAVENOUS | Status: DC
  Administered 2016-07-01: 4 ug via INTRAVENOUS
  Filled 2016-06-30 (×2): qty 2

## 2016-06-30 MED ORDER — HEPARIN (PORCINE) IN NACL 2-0.9 UNIT/ML-% IJ SOLN
INTRAMUSCULAR | Status: DC | PRN
  Administered 2016-06-30: 1000 mL

## 2016-06-30 MED ORDER — IOPAMIDOL (ISOVUE-370) INJECTION 76%
INTRAVENOUS | Status: AC
  Filled 2016-06-30: qty 100

## 2016-06-30 MED ORDER — SODIUM CHLORIDE 0.9 % IV BOLUS (SEPSIS)
500.0000 mL | Freq: Once | INTRAVENOUS | Status: AC
  Administered 2016-06-30: 500 mL via INTRAVENOUS

## 2016-06-30 MED ORDER — HEPARIN (PORCINE) IN NACL 100-0.45 UNIT/ML-% IJ SOLN
700.0000 [IU]/h | INTRAMUSCULAR | Status: DC
  Administered 2016-06-30: 750 [IU]/h via INTRAVENOUS
  Filled 2016-06-30 (×2): qty 250

## 2016-06-30 MED ORDER — IOPAMIDOL (ISOVUE-370) INJECTION 76%
INTRAVENOUS | Status: DC | PRN
  Administered 2016-06-30: 50 mL via INTRA_ARTERIAL

## 2016-06-30 MED ORDER — LIDOCAINE HCL (PF) 1 % IJ SOLN
INTRAMUSCULAR | Status: DC | PRN
  Administered 2016-06-30: 20 mL via SUBCUTANEOUS

## 2016-06-30 MED ORDER — RENA-VITE PO TABS
1.0000 | ORAL_TABLET | Freq: Every day | ORAL | Status: DC
  Administered 2016-06-30 – 2016-07-03 (×4): 1 via ORAL
  Filled 2016-06-30 (×4): qty 1

## 2016-06-30 SURGICAL SUPPLY — 9 items
CATH INFINITI 5FR MULTPACK ANG (CATHETERS) ×2 IMPLANT
CATH SITESEER 5F NTR (CATHETERS) ×2 IMPLANT
KIT HEART LEFT (KITS) ×2 IMPLANT
PACK CARDIAC CATHETERIZATION (CUSTOM PROCEDURE TRAY) ×2 IMPLANT
PAD DEFIB LIFELINK (PAD) ×2 IMPLANT
SHEATH PINNACLE 5F 10CM (SHEATH) ×2 IMPLANT
TRANSDUCER W/STOPCOCK (MISCELLANEOUS) ×2 IMPLANT
TUBING CIL FLEX 10 FLL-RA (TUBING) ×2 IMPLANT
WIRE EMERALD ST .035X150CM (WIRE) ×2 IMPLANT

## 2016-06-30 NOTE — Progress Notes (Signed)
[place] for heparin Indication: chest pain/ACS  Allergies  Allergen Reactions  . Oxycodone Itching    Patient Measurements: Height: 4\' 9"  (144.8 cm) Weight: 124 lb (56.2 kg) IBW/kg (Calculated) : 38.6 Heparin Dosing Weight: 50.8 kg  Vital Signs: Temp: 98.3 F (36.8 C) (10/30 2015) Temp Source: Oral (10/30 2015) BP: 93/40 (10/30 2210) Pulse Rate: 75 (10/30 2210)  Labs:  Recent Labs  06/29/16 1341 06/29/16 1623 06/29/16 2201 06/30/16 0200  HGB 10.6*  --   --  9.7*  HCT 32.2*  --   --  29.5*  PLT 182  --   --  172  HEPARINUNFRC  --   --   --  0.16*  CREATININE 2.88*  --   --   --   TROPONINI  --  0.81* 0.75*  --     Estimated Creatinine Clearance: 14.2 mL/min (by C-G formula based on SCr of 2.88 mg/dL (H)).    Assessment: 65 y.o. female with chest pain and elevated cardiac markers for heparin   Goal of Therapy:  Heparin level 0.3-0.7 units/ml Monitor platelets by anticoagulation protocol: Yes   Plan:  Heparin 1500 units IV bolus, then increase heparin 750 units/hr Check heparin level in 8 hours.  Phillis Knack, PharmD, BCPS   06/30/2016,2:46 AM

## 2016-06-30 NOTE — Progress Notes (Signed)
Site area: rt groin Site Prior to Removal:  Level 0 Pressure Applied For:  20 minutes Manual:   yes Patient Status During Pull:  stable Post Pull Site:  Level  0 Post Pull Instructions Given:  yes Post Pull Pulses Present: rt BKA Dressing Applied:  tegaderm  Bedrest begins @ 1130  Comments:

## 2016-06-30 NOTE — Progress Notes (Signed)
SLP Cancellation Note  Patient Details Name: LADASHIA DEMARINIS MRN: [id number] DOB: 03-27-1951   Cancelled treatment:        Pt in a procedure.                                                                                                 Matai Carpenito, Katherene Ponto 06/30/2016, 10:32 AM

## 2016-06-30 NOTE — H&P (View-Only) (Signed)
    Subjective:  Denies CP; mild dyspnea   Objective:  Vitals:   06/29/16 1746 06/29/16 2015 06/29/16 2210 06/30/16 0522  BP: 122/64 112/70 (!) 93/40 (!) 89/45  Pulse: 77 74 75 70  Resp: 18 19  17   Temp: 98.4 F (36.9 C) 98.3 F (36.8 C)  98.4 F (36.9 C)  TempSrc: Oral Oral  Oral  SpO2: 100% 100%  98%  Weight:  124 lb (56.2 kg)    Height:        Intake/Output from previous day:  Intake/Output Summary (Last 24 hours) at 06/30/16 0800 Last data filed at 06/30/16 0524  Gross per 24 hour  Intake              490 ml  Output                0 ml  Net              490 ml    Physical Exam: Physical exam: Well-developed chronically ill appearing in no acute distress.  Skin is warm and dry.  HEENT is normal.  Neck is supple.  Chest is clear to auscultation with normal expansion.  Cardiovascular exam is regular rate and rhythm.  Abdominal exam nontender or distended. No masses palpated. Extremities s/p bilateral BKA neuro grossly intact    Lab Results: Basic Metabolic Panel:  Recent Labs  06/29/16 1341 06/29/16 1631 06/30/16 0200  NA 137  --  135  K 2.9*  --  3.4*  CL 97*  --  98*  CO2 27  --  29  GLUCOSE 120*  --  125*  BUN 8  --  18  CREATININE 2.88*  --  4.20*  CALCIUM 8.9  --  8.2*  MG  --  1.9  --    CBC:  Recent Labs  06/29/16 1341 06/30/16 0200  WBC 4.6 5.2  NEUTROABS 2.2  --   HGB 10.6* 9.7*  HCT 32.2* 29.5*  MCV 88.7 88.1  PLT 182 172   Cardiac Enzymes:  Recent Labs  06/29/16 1623 06/29/16 2201 06/30/16 0200  TROPONINI 0.81* 0.75* 0.76*     Assessment/Plan:  65 year old female with end-stage renal disease, peripheral vascular disease, diabetes mellitus admitted with exertional dyspnea and atypical chest pain. Troponin abnormal consistent with non-ST elevation myocardial infarction.  1 non-ST elevation myocardial infarction-patient presents with progressive dyspnea on exertion and atypical chest pain. Her troponins are elevated  though no clear trend. Regardless she has multiple risk factors including greater than 20 years of diabetes mellitus and peripheral vascular disease. She needs definitive evaluation. Plan cardiac catheterization today. The risks and benefits including myocardial infarction, CVA and death discussed and she agrees to proceed. Continue aspirin, heparin and statin. Continue low dose metoprolol if BP allows.  2 hyperlipidemia-continue Lipitor 80 mg daily. Check lipids and liver in 4 weeks.   3 hypertension-Contunue Lopressor 12.5 mg twice a day in the setting of non-ST elevation myocardial infarction as tolerated by BP.  4 End-stage renal disease-dialysis per nephrology.   5 question pneumonia-clinically does not appear to have pneumonia. Management per primary care.   Kirk Ruths 06/30/2016, 8:00 AM

## 2016-06-30 NOTE — Consult Note (Signed)
[address]       [place] [address]             [phone]        Carolyn Valenzuela [place] Medical Record #[id number] Date of Birth: 18-Jan-1951  Referring: Dr. Angelena Form Primary Care: Cathlean Cower, MD  Chief Complaint:    Chief Complaint  Patient presents with  . Shortness of Breath   History of Present Illness:      Ms. Carolyn Valenzuela is a 66 yo [place] American female with complex medical history.  She has significant history of DM on oral agents, PVD with bilateral BKA's due to critical limb ischemia ( R 02/2014/ L 10/2015), ESRD on dialysis MWF initiated in 2016, Hypertension, Hyperlipidemia.  She also recently underwent GI workup in July and was found to have Grade C ulcerative esophagitis.  She was brought to the ED on 06/29/2016 after completion of dialysis with complaints of worsening shortness of breath.  She states this has been progressing over the past 2 weeks with exertion.  This is accompanied by mid chest/epigastric pain.  She denied associated diaphoresis, nausea and vomiting.  Workup in the ED showed EKG with non-specific ST changes in the anterolateral leads.  There was mild elevation of Troponin.  CXR was concerning for pneumonia and she was placed on empiric antibiotics.  She was admitted for observation and further workup.  Cardiology consult was obtained and ruled patient in for NSTEMI.  It was felt she would require cardiac catheterization.  This was performed today and showed severe 2 vessel CAD.  It was felt the best option for revascularization would bypass surgery.  TCTS consult has been requested.  Currently the patient continues to feel short of breath.  She also has some mild discomfort along her throat and into her mid chest.  She states she is ambulatory with prosthesis and takes care of her home.  She lives alone and would not have help at discharge.    Current Activity/ Functional Status: Patient is independent with mobility/ambulation,  transfers, ADL's, IADL's.  patient is ambulatory with prothesis and walker , does get to dialysis with transport service but has aid at home that helps her. Zubrod Score: At the time of surgery this patient's most appropriate activity status/level should be described as: []     0    Normal activity, no symptoms []     1    Restricted in physical strenuous activity but ambulatory, able to do out light work []     2    Ambulatory and capable of self care, unable to do work activities, up and about                 more than 50%  Of the time                            [x]     3    Only limited self care, in bed greater than 50% of waking hours []     4    Completely disabled, no self care, confined to bed or chair []     5    Moribund  Past Medical History:  Diagnosis Date  . Allergic rhinitis, cause unspecified 12/08/2010  . ANEMIA-IRON DEFICIENCY 01/25/2008  . BACK PAIN 05/02/2009  . CERVICAL RADICULOPATHY, LEFT 01/25/2008  . Critical lower limb ischemia   . DIABETES MELLITUS, UNCONTROLLED 05/21/2009  . Foot ulcer ([place])  right lateral malleolus  . HYPERLIPIDEMIA 03/30/2007  . HYPERTENSION 01/25/2008  . NUMBNESS 05/21/2009  . RENAL INSUFFICIENCY 02/01/2008   HD pt.  . SECONDARY HYPERPARATHYROIDISM 05/21/2009  . SINUSITIS- ACUTE-NOS 01/25/2008    Past Surgical History:  Procedure Laterality Date  . AMPUTATION Right 03/15/2014   Procedure: RIGHT  LEG  BELOW KNEE AMPUTATION ;  Surgeon: [name], MD;  Location: [facility];  Service: Orthopedics;  Laterality: Right;  . AMPUTATION Left 11/29/2015   Procedure: AMPUTATION BELOW KNEE;  Surgeon: Newt Minion, MD;  Location: [facility];  Service: Orthopedics;  Laterality: Left;  . AV FISTULA PLACEMENT Left   . AV FISTULA PLACEMENT Left 09/06/2014   Procedure: ARTERIOVENOUS (AV) FISTULA CREATION-Left Brachiocephalic;  Surgeon: Conrad Bell, MD;  Location: [facility];  Service: Vascular;  Laterality: Left;  . COLONOSCOPY    . ESOPHAGOGASTRODUODENOSCOPY N/A 03/20/2016    Procedure: ESOPHAGOGASTRODUODENOSCOPY (EGD);  Surgeon: Milus Banister, MD;  Location: [facility];  Service: Endoscopy;  Laterality: N/A;  . EYE SURGERY Bilateral    cataracts removed, left eye still has some oil in it.  . INSERTION OF DIALYSIS CATHETER N/A 09/03/2014   Procedure: INSERTION OF DIALYSIS CATHETER RIGHT INTERNAL JUGULAR VEIN;  Surgeon: Conrad Victoria, MD;  Location: [facility];  Service: Vascular;  Laterality: N/A;  . PERIPHERAL VASCULAR CATHETERIZATION N/A 08/13/2015   Procedure: Abdominal Aortogram;  Surgeon: Elam Dutch, MD;  Location: [facility];  Service: Cardiovascular;  Laterality: N/A;    History  Smoking Status  . Never Smoker  Smokeless Tobacco  . Never Used    History  Alcohol Use No    Social History   Social History  . Marital status: Single    Spouse name: N/A  . Number of children: N/A  . Years of education: N/A   Occupational History  . [employer] History Main Topics  . Smoking status: Never Smoker  . Smokeless tobacco: Never Used  . Alcohol use No  . Drug use: No  . Sexual activity: Not on file   Other Topics Concern  . Not on file   Social History Narrative  . No narrative on file    Allergies  Allergen Reactions  . Oxycodone Itching    Current Facility-Administered Medications  Medication Dose Route Frequency Provider Last Rate Last Dose  . [MAR Hold] 0.9 %  sodium chloride infusion  250 mL Intravenous PRN Reyne Dumas, MD      . 0.9 %  sodium chloride infusion  250 mL Intravenous PRN Lelon Perla, MD      . 0.9 %  sodium chloride infusion   Intravenous Continuous Lelon Perla, MD      . Doug Sou Hold] acetaminophen (TYLENOL) tablet 650 mg  650 mg Oral Q6H PRN Reyne Dumas, MD   650 mg at 06/30/16 0845   Or  . [MAR Hold] acetaminophen (TYLENOL) suppository 650 mg  650 mg Rectal Q6H PRN Reyne Dumas, MD      . Doug Sou Hold] aspirin EC tablet 81 mg  81 mg Oral Daily Reyne Dumas, MD   81 mg  at 06/29/16 1855  . [MAR Hold] atorvastatin (LIPITOR) tablet 80 mg  80 mg Oral q1800 Lelon Perla, MD   80 mg at 06/29/16 1855  . [MAR Hold] ceFEPIme (MAXIPIME) 2 g in dextrose 5 % 50 mL IVPB  2 g Intravenous Q M,W,F-HD Jake Church Masters, RPH      . [  MAR Hold] gabapentin (NEURONTIN) capsule 100 mg  100 mg Oral BID Reyne Dumas, MD   100 mg at 06/29/16 2218  . [MAR Hold] glipiZIDE (GLUCOTROL XL) 24 hr tablet 10 mg  10 mg Oral Q breakfast Reyne Dumas, MD      . heparin ADULT infusion 100 units/mL (25000 units/23mL sodium chloride 0.45%)  750 Units/hr Intravenous Continuous Reyne Dumas, MD 7.5 mL/hr at 06/30/16 0309 750 Units/hr at 06/30/16 0309  . [MAR Hold] insulin aspart (novoLOG) injection 0-9 Units  0-9 Units Subcutaneous TID WC Reyne Dumas, MD   2 Units at 06/29/16 1912  . [MAR Hold] metoprolol tartrate (LOPRESSOR) tablet 12.5 mg  12.5 mg Oral BID Lelon Perla, MD      . Doug Sou Hold] ondansetron [facility]) tablet 4 mg  4 mg Oral Q6H PRN Reyne Dumas, MD       Or  . Doug Sou Hold] ondansetron (ZOFRAN) injection 4 mg  4 mg Intravenous Q6H PRN Reyne Dumas, MD      . Doug Sou Hold] sevelamer carbonate (RENVELA) tablet 1,600 mg  1,600 mg Oral TID WC Reyne Dumas, MD      . Doug Sou Hold] sodium chloride flush (NS) 0.9 % injection 3 mL  3 mL Intravenous Q12H Reyne Dumas, MD      . Doug Sou Hold] sodium chloride flush (NS) 0.9 % injection 3 mL  3 mL Intravenous Q12H Reyne Dumas, MD      . Doug Sou Hold] sodium chloride flush (NS) 0.9 % injection 3 mL  3 mL Intravenous PRN Reyne Dumas, MD      . sodium chloride flush (NS) 0.9 % injection 3 mL  3 mL Intravenous Q12H Lelon Perla, MD      . sodium chloride flush (NS) 0.9 % injection 3 mL  3 mL Intravenous PRN Lelon Perla, MD      . Doug Sou Hold] vancomycin (VANCOCIN) 500 mg in sodium chloride 0.9 % 100 mL IVPB  500 mg Intravenous Q M,W,F-HD Jake Church Masters, [facility]        Prescriptions Prior to Admission  Medication Sig Dispense Refill Last Dose  .  acetaminophen (TYLENOL) 325 MG tablet Take 2 tablets (650 mg total) by mouth every 6 (six) hours as needed for mild pain, moderate pain or fever.   Past Week at Unknown time  . amLODipine (NORVASC) 10 MG tablet Take 1 tablet (10 mg total) by mouth daily. 90 tablet 3 06/28/2016 at Unknown time  . aspirin 81 MG EC tablet Take 81 mg by mouth daily.     06/28/2016 at Unknown time  . [facility] ACID REDUCER 10 MG tablet TAKE 1 TABLET (10 MG TOTAL) BY MOUTH DAILY. 30 tablet 1 06/28/2016 at Unknown time  . gabapentin (NEURONTIN) 100 MG capsule Take 1 capsule by mouth 2 (two) times daily.  3 06/28/2016 at Unknown time  . glipiZIDE (GLUCOTROL XL) 10 MG 24 hr tablet Take 1 tablet (10 mg total) by mouth daily with breakfast. 90 tablet 3 06/28/2016 at Unknown time  . ONETOUCH DELICA LANCETS FINE MISC Use to help check blood sugars twice a day Dx e11.9 100 each 3 Past Week at Unknown time  . senna (SENOKOT) 8.6 MG TABS tablet Take 2 tablets (17.2 mg total) by mouth 2 (two) times daily before a meal. (Patient taking differently: Take 2 tablets by mouth daily as needed for mild constipation. ) 120 each 1 06/28/2016 at Unknown time  . sevelamer carbonate (RENVELA) 800 MG tablet Take 1,600 mg by  mouth 3 (three) times daily with meals.   06/28/2016 at Unknown time  . simvastatin (ZOCOR) 20 MG tablet Take 1 tablet (20 mg total) by mouth daily at 6 PM. 90 tablet 3 06/28/2016 at Unknown time  . traZODone (DESYREL) 50 MG tablet TAKE 0.5-1 TABLETS (25-50 MG TOTAL) BY MOUTH AT BEDTIME AS NEEDED FOR SLEEP. 30 tablet 1 06/28/2016 at Unknown time  . glucose blood (ONETOUCH VERIO) test strip 1 each by Other route 2 (two) times daily. Use to check blood sugars twice a day Dx E11.9 (Patient not taking: Reported on 06/29/2016) 100 each 3 Not Taking at Unknown time  . oxyCODONE-acetaminophen (PERCOCET/ROXICET) 5-325 MG tablet Take 1 tablet by mouth every 6 (six) hours as needed for severe pain.   Not Taking at Unknown time  . pantoprazole  (PROTONIX) 40 MG tablet Take 1 tablet (40 mg total) by mouth 2 (two) times daily before a meal. (Patient not taking: Reported on 06/29/2016) 60 tablet 1 Not Taking at Unknown time    Family History  Problem Relation Age of Onset  . Cancer Mother     Pancreatic   Review of Systems:  Constitutional: negative Eyes: negative Respiratory: positive for dyspnea on exertion and pleurisy/chest pain Cardiovascular: positive for chest pressure/discomfort and palpitations Gastrointestinal: negative Integument/breast: negative Musculoskeletal:positive for bilateral BKA Neurological: negative Endocrine: positive for diabetes     Cardiac Review of Systems: Y or N  Chest Pain [  y  ]  Resting SOB Blue.Reese   ] Exertional SOB  [  y]  Orthopnea [ y ]   Pedal Edema [  n ]    Palpitations [ n ] Syncope  [ n ]   Presyncope [n   ]  General Review of Systems: [Y] = yes [  ]=no Constitional: recent weight change [  ]; anorexia [  ]; fatigue [  ]; nausea [  ]; night sweats [  ]; fever [  ]; or chills [  ]                                                               Dental: poor dentition[  ]; Last Dentist visit:   Eye : blurred vision [  ]; diplopia [   ]; vision changes [  ];  Amaurosis fugax[  ]; Resp: cough [  ];  wheezing[  ];  hemoptysis[  ]; shortness of breath[  ]; paroxysmal nocturnal dyspnea[  ]; dyspnea on exertion[  ]; or orthopnea[  ];  GI:  gallstones[  ], vomiting[  ];  dysphagia[  ]; melena[  ];  hematochezia [  ]; heartburn[  ];   Hx of  Colonoscopy[  ]; GU: kidney stones [  ]; hematuria[  ];   dysuria [  ];  nocturia[  ];  history of     obstruction [  ]; urinary frequency [  ]             Skin: rash, swelling[  ];, hair loss[  ];  peripheral edema[  ];  or itching[  ]; Musculosketetal: myalgias[  ];  joint swelling[  ];  joint erythema[  ];  joint pain[  ];  back pain[  ];  Heme/Lymph: bruising[  ];  bleeding[  ];  anemia[  ];  Neuro: TIA[  ];  headaches[  ];  stroke[  ];  vertigo[  ];  seizures[   ];   paresthesias[  ];  difficulty walking[  ];  Psych:depression[  ]; anxiety[  ];  Endocrine: diabetes[  ];  thyroid dysfunction[  ];  Immunizations: Flu [ n ]; Pneumococcaln[  ];  Other:  Physical Exam: BP (!) 87/46   Pulse 74   Temp 99.4 F (37.4 C) (Oral)   Resp 16   Ht 4\' 9"  (1.448 m)   Wt 124 lb (56.2 kg)   SpO2 100%   BMI 26.83 kg/m    General appearance: alert, cooperative and frail Head: Normocephalic, without obvious abnormality, atraumatic Lymph nodes: Cervical, supraclavicular, and axillary nodes normal. Resp: clear to auscultation bilaterally Cardio: regular rate and rhythm GI: soft, non-tender; bowel sounds normal; no masses,  no organomegaly Extremities: bilateral BKA Neurologic: Grossly normal  Diagnostic Studies & Laboratory data:     Recent Radiology Findings:   Dg Chest 2 View  Result Date: 06/29/2016 CLINICAL DATA:  Two weeks of chest pain and shortness of breath. History of hypertension, diabetes, nonsmoker. EXAM: CHEST  2 VIEW COMPARISON:  PA and lateral chest x-ray of December 11, 2015. FINDINGS: The lungs are reasonably well inflated the interstitial markings are mildly increased chronically. There are coarse retrocardiac lung markings on the left which are more conspicuous than in the past. The heart is top-normal in size. The pulmonary vascularity is not engorged. There is calcification in the wall of the aortic arch. There is no pleural effusion. The bony thorax exhibits no acute abnormality. There are chronic degenerative changes of the right glenohumeral joint. IMPRESSION: Atelectasis or infiltrate in the left lower lobe superimposed upon chronic fibrotic changes. No evidence of CHF. Followup PA and lateral chest X-ray is recommended in 3-4 weeks following trial of antibiotic therapy to ensure resolution and exclude underlying malignancy. Electronically Signed   By: David  Martinique M.D.   On: 06/29/2016 13:02     I have independently reviewed the above  radiologic studies.  Recent Lab Findings: Lab Results  Component Value Date   WBC 5.2 06/30/2016   HGB 9.7 (L) 06/30/2016   HCT 29.5 (L) 06/30/2016   PLT 172 06/30/2016   GLUCOSE 125 (H) 06/30/2016   CHOL 213 (H) 05/28/2015   TRIG 131.0 05/28/2015   HDL 52.30 05/28/2015   LDLDIRECT 116.6 06/08/2012   LDLCALC 135 (H) 05/28/2015   ALT 7 (L) 06/30/2016   AST 15 06/30/2016   NA 135 06/30/2016   K 3.4 (L) 06/30/2016   CL 98 (L) 06/30/2016   CREATININE 4.20 (H) 06/30/2016   BUN 18 06/30/2016   CO2 29 06/30/2016   TSH 1.52 05/28/2015   INR 1.06 06/30/2016   HGBA1C 8.3 03/12/2016   Assessment / Plan:      1. CAD- severe 2 vessel CAD... Requesting CABG consult 2. ESRD- on dialysis MWF- fistula in left arm 3. PVD- S/P Bilateral BKA 4. DM- uncontrolled 5  infiltrate in the left lower lobe superimposed upon chronic fibrotic changes- possible pneumonia on admission 6. Dispo- patient is frail lady with ESRD, needing CABG... Patient would be high risk candidate for bypass surgery.  Conduit would also be a problem with severe PVD with amputations any remaining SVG would likely be unsuitable for conduit.  Radial artery harvest is not appropriate  Patient with her fragility and rapidly progression of diffuse vascular disease would be very poor candidate to proceed with CABG. Would consider  other options    I  spent 40 minutes counseling the patient face to face and 50% or more the  time was spent in counseling and coordination of care. The total time spent in the appointment was 60 minutes. Grace Isaac MD      [place] , [id number] Office [phone]   [place]

## 2016-06-30 NOTE — Progress Notes (Signed)
[facility] PROGRESS NOTE  Carolyn Valenzuela JIR:[id number] DOB: Nov 12, 1950 DOA: 06/29/2016   PCP: Cathlean Cower, MD     Assessment/Plan: Active Problems:   Hyperlipidemia   Essential hypertension   S/P BKA (below knee amputation) ([place])   Type 2 diabetes mellitus with ESRD (end-stage renal disease) ([place])   Anemia of renal disease   ESRD needing dialysis ([place])   HCAP (healthcare-associated pneumonia)   NSTEMI (non-ST elevated myocardial infarction) ([place])   Elevated troponin   65 year old female with end-stage renal disease(HD initiated 1/2-14/2016) MWF at [facility] , diabetes, hypertension, dyslipidemia, peripheral vascular disease , who presents to the ER after dialysis due to complaints of worsening shortness of breath with exertion for the last 2 weeks. Patient also describes mid chest/epigastric pain for the past 2 weeks. She states that the chest pain radiates to both her shoulders. She is unable to specify what precipitates her chest pain. She denies any fever, cough. She states that the chest pain can occur at any time, even at rest. Prior history of intermittent dysphagia to solids and liquids. Recent upper endoscopy 03/19/16  showed grade C ulcerative reflux type esophagitis with surrounding edema. Patient denies any episodes of hematemesis, melena.  In the ER EKG shows nonspecific ST-T segment changes in the anterolateral leads, troponin I 0.73, potassium 2.9. Hemoglobin 10.6. Chest x-ray atelectasis vs infiltrate in the left lower lobe. No evidence of CHF. Cardiology consulted for NSTEMI  Assessment and plan  Healthcare associated pneumonia Patient has been started on vancomycin/cefepime Monday Wednesday Friday with dialysis   pro-calcitonin 0.34,continue current abx   non-ST elevation myocardial infarction-patient presents with progressive dyspnea on exertion and atypical chest pain-concerning for unstable angina given multiple cardiovascular  risk factors Cardiology has been consulted, s/p cath showed  severe diffuse CAD. She is a diabetic on hemodialysis and the favorable approach to revascularization would be with bypass surgery,currently on heparin gtt Cardiology to make further recommendation      Hyperlipidemia-continue statin    Essential hypertension -continue outpatient medications    S/P bilateral  BKA (below knee amputation) (HCC)-noted    Type 2 diabetes mellitus with ESRD -patient has been started on sliding scale insulin and Accu-Cheks    Anemia of renal disease-baseline  hemoglobin 10.5, stable    ESRD needing dialysis (HCC)-EDP has notified Dr. [facility] Monday Wednesday Friday     History of esophagitis-will continue with PPI, chest pain could be related to esophageal issues, will order speech therapy evaluation to rule out aspiration     Hypokalemia-repleted  DVT prophylaxsis  Heparin gtt  Code Status:  Full code     Family Communication: Discussed in detail with the patient, all imaging results, lab results explained to the patient   Disposition Plan:   As per cardiology and nephrology     Consultants:  cardiology  Procedures:  None   Antibiotics: Anti-infectives    Start     Dose/Rate Route Frequency Ordered Stop   07/01/16 1200  [MAR Hold]  ceFEPIme (MAXIPIME) 2 g in dextrose 5 % 50 mL IVPB     (MAR Hold since 06/30/16 1016)   2 g 100 mL/hr over 30 Minutes Intravenous Every M-W-F (Hemodialysis) 06/29/16 1446     07/01/16 1200  [MAR Hold]  vancomycin (VANCOCIN) 500 mg in sodium chloride 0.9 % 100 mL IVPB     (MAR Hold since 06/30/16 1016)   500 mg 100 mL/hr over 60 Minutes Intravenous Every  M-W-F (Hemodialysis) 06/29/16 1446     06/29/16 1400  vancomycin (VANCOCIN) IVPB 1000 mg/200 mL premix     1,000 mg 200 mL/hr over 60 Minutes Intravenous  Once 06/29/16 1335 06/29/16 1646   06/29/16 1400  ceFEPIme (MAXIPIME) 2 g in dextrose 5 % 50 mL IVPB     2 g 100 mL/hr  over 30 Minutes Intravenous  Once 06/29/16 1335 06/29/16 1515         HPI/Subjective: Denies CP; mild dyspnea  Objective: Vitals:   06/30/16 1125 06/30/16 1130 06/30/16 1135 06/30/16 1215  BP: (!) 99/49 93/66 (!) 87/46 (!) 100/42  Pulse: 76 78 74 75  Resp: 13 12 16 11   Temp:      TempSrc:      SpO2: 100% 100% 100% 100%  Weight:      Height:        Intake/Output Summary (Last 24 hours) at 06/30/16 1247 Last data filed at 06/30/16 0921  Gross per 24 hour  Intake              490 ml  Output                0 ml  Net              490 ml    Exam:  Examination:  General exam: Appears calm and comfortable  Respiratory system: Clear to auscultation. Respiratory effort normal. Cardiovascular system: S1 & S2 heard, RRR. No JVD, murmurs, rubs, gallops or clicks. No pedal edema. Gastrointestinal system: Abdomen is nondistended, soft and nontender. No organomegaly or masses felt. Normal bowel sounds heard. Central nervous system: Alert and oriented. No focal neurological deficits. Extremities: Symmetric 5 x 5 power. Skin: No rashes, lesions or ulcers Psychiatry: Judgement and insight appear normal. Mood & affect appropriate.     Data Reviewed: I have personally reviewed following labs and imaging studies  Micro Results Recent Results (from the past 240 hour(s))  Surgical PCR screen     Status: None   Collection Time: 06/30/16  5:31 AM  Result Value Ref Range Status   MRSA, PCR NEGATIVE NEGATIVE Final   Staphylococcus aureus NEGATIVE NEGATIVE Final    Comment:        The Xpert SA Assay (FDA approved for NASAL specimens in patients over 62 years of age), is one component of a comprehensive surveillance program.  Test performance has been validated by [facility] for patients greater than or equal to 67 year old. It is not intended to diagnose infection nor to guide or monitor treatment.     Radiology Reports Dg Chest 2 View  Result Date: 06/29/2016 CLINICAL  DATA:  Two weeks of chest pain and shortness of breath. History of hypertension, diabetes, nonsmoker. EXAM: CHEST  2 VIEW COMPARISON:  PA and lateral chest x-ray of December 11, 2015. FINDINGS: The lungs are reasonably well inflated the interstitial markings are mildly increased chronically. There are coarse retrocardiac lung markings on the left which are more conspicuous than in the past. The heart is top-normal in size. The pulmonary vascularity is not engorged. There is calcification in the wall of the aortic arch. There is no pleural effusion. The bony thorax exhibits no acute abnormality. There are chronic degenerative changes of the right glenohumeral joint. IMPRESSION: Atelectasis or infiltrate in the left lower lobe superimposed upon chronic fibrotic changes. No evidence of CHF. Followup PA and lateral chest X-ray is recommended in 3-4 weeks following trial of antibiotic therapy to ensure  resolution and exclude underlying malignancy. Electronically Signed   By: David  Martinique M.D.   On: 06/29/2016 13:02     CBC  Recent Labs Lab 06/29/16 1341 06/30/16 0200  WBC 4.6 5.2  HGB 10.6* 9.7*  HCT 32.2* 29.5*  PLT 182 172  MCV 88.7 88.1  MCH 29.2 29.0  MCHC 32.9 32.9  RDW 17.4* 17.0*  LYMPHSABS 1.9  --   MONOABS 0.4  --   EOSABS 0.1  --   BASOSABS 0.1  --     Chemistries   Recent Labs Lab 06/29/16 1341 06/29/16 1631 06/30/16 0200  NA 137  --  135  K 2.9*  --  3.4*  CL 97*  --  98*  CO2 27  --  29  GLUCOSE 120*  --  125*  BUN 8  --  18  CREATININE 2.88*  --  4.20*  CALCIUM 8.9  --  8.2*  MG  --  1.9  --   AST 16  --  15  ALT 7*  --  7*  ALKPHOS 72  --  64  BILITOT 0.5  --  0.4   ------------------------------------------------------------------------------------------------------------------ estimated creatinine clearance is 9.7 mL/min (by C-G formula based on SCr of 4.2 mg/dL  (H)). ------------------------------------------------------------------------------------------------------------------ No results for input(s): HGBA1C in the last 72 hours. ------------------------------------------------------------------------------------------------------------------ No results for input(s): CHOL, HDL, LDLCALC, TRIG, CHOLHDL, LDLDIRECT in the last 72 hours. ------------------------------------------------------------------------------------------------------------------ No results for input(s): TSH, T4TOTAL, T3FREE, THYROIDAB in the last 72 hours.  Invalid input(s): FREET3 ------------------------------------------------------------------------------------------------------------------ No results for input(s): VITAMINB12, FOLATE, FERRITIN, TIBC, IRON, RETICCTPCT in the last 72 hours.  Coagulation profile  Recent Labs Lab 06/30/16 0200  INR 1.06    No results for input(s): DDIMER in the last 72 hours.  Cardiac Enzymes  Recent Labs Lab 06/29/16 1623 06/29/16 2201 06/30/16 0200  TROPONINI 0.81* 0.75* 0.76*   ------------------------------------------------------------------------------------------------------------------ Invalid input(s): POCBNP   CBG:  Recent Labs Lab 06/29/16 1846 06/29/16 2024 06/30/16 0749 06/30/16 1111 06/30/16 1208  GLUCAP 170* 146* 82 81 91       Studies: Dg Chest 2 View  Result Date: 06/29/2016 CLINICAL DATA:  Two weeks of chest pain and shortness of breath. History of hypertension, diabetes, nonsmoker. EXAM: CHEST  2 VIEW COMPARISON:  PA and lateral chest x-ray of December 11, 2015. FINDINGS: The lungs are reasonably well inflated the interstitial markings are mildly increased chronically. There are coarse retrocardiac lung markings on the left which are more conspicuous than in the past. The heart is top-normal in size. The pulmonary vascularity is not engorged. There is calcification in the wall of the aortic arch.  There is no pleural effusion. The bony thorax exhibits no acute abnormality. There are chronic degenerative changes of the right glenohumeral joint. IMPRESSION: Atelectasis or infiltrate in the left lower lobe superimposed upon chronic fibrotic changes. No evidence of CHF. Followup PA and lateral chest X-ray is recommended in 3-4 weeks following trial of antibiotic therapy to ensure resolution and exclude underlying malignancy. Electronically Signed   By: David  Martinique M.D.   On: 06/29/2016 13:02      Lab Results  Component Value Date   HGBA1C 8.3 03/12/2016   HGBA1C 11.5 (H) 05/28/2015   HGBA1C 9.8 (H) 02/26/2015   Lab Results  Component Value Date   MICROALBUR 14.2 (H) 08/29/2009   LDLCALC 135 (H) 05/28/2015   CREATININE 4.20 (H) 06/30/2016       Scheduled Meds: . [MAR Hold] aspirin EC  81 mg Oral  Daily  . [MAR Hold] atorvastatin  80 mg Oral q1800  . [MAR Hold] ceFEPime (MAXIPIME) IV  2 g Intravenous Q M,W,F-HD  . [MAR Hold] gabapentin  100 mg Oral BID  . [MAR Hold] glipiZIDE  10 mg Oral Q breakfast  . [MAR Hold] insulin aspart  0-9 Units Subcutaneous TID WC  . [MAR Hold] metoprolol tartrate  12.5 mg Oral BID  . [MAR Hold] sevelamer carbonate  1,600 mg Oral TID WC  . [MAR Hold] sodium chloride flush  3 mL Intravenous Q12H  . [MAR Hold] sodium chloride flush  3 mL Intravenous Q12H  . sodium chloride flush  3 mL Intravenous Q12H  . [MAR Hold] vancomycin  500 mg Intravenous Q M,W,F-HD   Continuous Infusions: . sodium chloride    . heparin 750 Units/hr (06/30/16 0309)     LOS: 0 days    Time spent: >30 MINS    [facility]  [facility] Pager [phone]. If 7PM-7AM, please contact night-coverage at [email or web address], password [facility] 06/30/2016, 12:47 PM  LOS: 0 days

## 2016-06-30 NOTE — Consult Note (Signed)
[facility] Renal Consultation Note    Indication for Consultation:  Management of ESRD/hemodialysis; anemia, hypertension/volume and secondary hyperparathyroidism  HPI: Carolyn Valenzuela is a 65 y.o. female with ESRD secondary to DM/HTN on hemodialysis MWF since 08/2014. Her medical history is also significant for diabetic neuropathy, PVD s/p R BKA 2015 L BKA 2017, cervical radiculopathy, chronic back pain. Last hospital admit in July 2017 with dysphasia EGD revealing nonspecific esophagitis/mild gastritis sent home on PPI course. She presented to the ED via EMS after her HD session yesterday. It was noted she was c/o of "heart racing" during HD and advised ED follow up if no improvement Pulse and BP were normal during HD and she completed her full treatment    On ED admission: Temp 98.1 RR 18 SpO2 100% BP 120/51 HR 80. Labs Na 137 K 2.9 BUN 8 Cr 2.88 Hgb 10.6 WBC 5.2 Troponin 0.73 EKG  CXR with possible infiltrate in LLB. She was given 51mEq potassium chloride in the ED for hypokalemia. Blood /sputum cultures drawn and abx started.   Carolyn Valenzuela reports that she has had this fluttering sensation in her throat with episodes of sharp sternal chest pain for about 2-3 weeks. The episodes last a few seconds and are not related to activity. She does reports some SOB with activity. She describes the sensation as a "flutter" that starts in her throat and goes down into her chest. Carolyn Valenzuela was planning to see her PCP for this next week. She denies history of heart disease. She has not been coughing. She denies fever, chills, nausea, vomiting, or abdominal pain. She is now admitted for further evaluation.   Past Medical History:  Diagnosis Date  . Allergic rhinitis, cause unspecified 12/08/2010  . ANEMIA-IRON DEFICIENCY 01/25/2008  . BACK PAIN 05/02/2009  . CERVICAL RADICULOPATHY, LEFT 01/25/2008  . Critical lower limb ischemia   . DIABETES MELLITUS, UNCONTROLLED 05/21/2009  . Foot ulcer ([place])     right lateral malleolus  . HYPERLIPIDEMIA 03/30/2007  . HYPERTENSION 01/25/2008  . NUMBNESS 05/21/2009  . RENAL INSUFFICIENCY 02/01/2008   HD pt.  . SECONDARY HYPERPARATHYROIDISM 05/21/2009  . SINUSITIS- ACUTE-NOS 01/25/2008   Past Surgical History:  Procedure Laterality Date  . AMPUTATION Right 03/15/2014   Procedure: RIGHT  LEG  BELOW KNEE AMPUTATION ;  Surgeon: [name], MD;  Location: [place];  Service: Orthopedics;  Laterality: Right;  . AMPUTATION Left 11/29/2015   Procedure: AMPUTATION BELOW KNEE;  Surgeon: Newt Minion, MD;  Location: [place];  Service: Orthopedics;  Laterality: Left;  . AV FISTULA PLACEMENT Left   . AV FISTULA PLACEMENT Left 09/06/2014   Procedure: ARTERIOVENOUS (AV) FISTULA CREATION-Left Brachiocephalic;  Surgeon: Conrad Coleman, MD;  Location: [place];  Service: Vascular;  Laterality: Left;  . COLONOSCOPY    . ESOPHAGOGASTRODUODENOSCOPY N/A 03/20/2016   Procedure: ESOPHAGOGASTRODUODENOSCOPY (EGD);  Surgeon: Milus Banister, MD;  Location: [place];  Service: Endoscopy;  Laterality: N/A;  . EYE SURGERY Bilateral    cataracts removed, left eye still has some oil in it.  . INSERTION OF DIALYSIS CATHETER N/A 09/03/2014   Procedure: INSERTION OF DIALYSIS CATHETER RIGHT INTERNAL JUGULAR VEIN;  Surgeon: Conrad Gordon, MD;  Location: [place];  Service: Vascular;  Laterality: N/A;  . PERIPHERAL VASCULAR CATHETERIZATION N/A 08/13/2015   Procedure: Abdominal Aortogram;  Surgeon: Elam Dutch, MD;  Location: [place] [facility];  Service: Cardiovascular;  Laterality: N/A;   Family History  Problem Relation Age of Onset  .  Cancer Mother     Pancreatic   Social History:  reports that she has never smoked. She has never used smokeless tobacco. She reports that she does not drink alcohol or use drugs. Allergies  Allergen Reactions  . Oxycodone Itching   Prior to Admission medications   Medication Sig Start Date End Date Taking? Authorizing Provider  acetaminophen (TYLENOL) 325  MG tablet Take 2 tablets (650 mg total) by mouth every 6 (six) hours as needed for mild pain, moderate pain or fever. 09/13/14  Yes Modena Jansky, MD  amLODipine (NORVASC) 10 MG tablet Take 1 tablet (10 mg total) by mouth daily. 12/31/15  Yes Biagio Borg, MD  aspirin 81 MG EC tablet Take 81 mg by mouth daily.     Yes Historical Provider, MD  [employer] ACID REDUCER 10 MG tablet TAKE 1 TABLET (10 MG TOTAL) BY MOUTH DAILY. 03/13/16  Yes Ivan Anchors Love, PA-C  gabapentin (NEURONTIN) 100 MG capsule Take 1 capsule by mouth 2 (two) times daily. 10/29/15  Yes Historical Provider, MD  glipiZIDE (GLUCOTROL XL) 10 MG 24 hr tablet Take 1 tablet (10 mg total) by mouth daily with breakfast. 03/12/16  Yes Biagio Borg, MD  [facility] DELICA LANCETS FINE MISC Use to help check blood sugars twice a day Dx e11.9 01/09/16  Yes Biagio Borg, MD  senna (SENOKOT) 8.6 MG TABS tablet Take 2 tablets (17.2 mg total) by mouth 2 (two) times daily before a meal. Patient taking differently: Take 2 tablets by mouth daily as needed for mild constipation.  12/20/15  Yes Ivan Anchors Love, PA-C  sevelamer carbonate (RENVELA) 800 MG tablet Take 1,600 mg by mouth 3 (three) times daily with meals.   Yes Historical Provider, MD  simvastatin (ZOCOR) 20 MG tablet Take 1 tablet (20 mg total) by mouth daily at 6 PM. 05/28/15  Yes Biagio Borg, MD  traZODone (DESYREL) 50 MG tablet TAKE 0.5-1 TABLETS (25-50 MG TOTAL) BY MOUTH AT BEDTIME AS NEEDED FOR SLEEP. 02/18/16  Yes Ankit Lorie Phenix, MD  furosemide (LASIX) 40 MG tablet TAKE 2 TABLETS BY MOUTH EVERY MORNING AND TAKE 1 TABLET EVERY EVENING 06/30/16   Biagio Borg, MD  glucose blood (ONETOUCH VERIO) test strip 1 each by Other route 2 (two) times daily. Use to check blood sugars twice a day Dx E11.9 Patient not taking: Reported on 06/29/2016 01/09/16   Biagio Borg, MD  oxyCODONE-acetaminophen (PERCOCET/ROXICET) 5-325 MG tablet Take 1 tablet by mouth every 6 (six) hours as needed for severe pain.    Newt Minion, MD  pantoprazole (PROTONIX) 40 MG tablet Take 1 tablet (40 mg total) by mouth 2 (two) times daily before a meal. Patient not taking: Reported on 06/29/2016 03/20/16   Theodis Blaze, MD   Current Facility-Administered Medications  Medication Dose Route Frequency Provider Last Rate Last Dose  . 0.9 %  sodium chloride infusion  250 mL Intravenous PRN Reyne Dumas, MD      . acetaminophen (TYLENOL) tablet 650 mg  650 mg Oral Q6H PRN Reyne Dumas, MD   650 mg at 06/30/16 0845   Or  . acetaminophen (TYLENOL) suppository 650 mg  650 mg Rectal Q6H PRN Reyne Dumas, MD      . aspirin EC tablet 81 mg  81 mg Oral Daily Reyne Dumas, MD   81 mg at 06/29/16 1855  . atorvastatin (LIPITOR) tablet 80 mg  80 mg Oral q1800 Lelon Perla, MD  80 mg at 06/29/16 1855  . [START ON 07/01/2016] ceFEPIme (MAXIPIME) 2 g in dextrose 5 % 50 mL IVPB  2 g Intravenous Q M,W,F-HD Jake Church Masters, RPH      . gabapentin (NEURONTIN) capsule 100 mg  100 mg Oral BID Reyne Dumas, MD   100 mg at 06/29/16 2218  . glipiZIDE (GLUCOTROL XL) 24 hr tablet 10 mg  10 mg Oral Q breakfast Reyne Dumas, MD      . heparin ADULT infusion 100 units/mL (25000 units/27mL sodium chloride 0.45%)  750 Units/hr Intravenous Continuous Reyne Dumas, MD 7.5 mL/hr at 06/30/16 0309 750 Units/hr at 06/30/16 0309  . insulin aspart (novoLOG) injection 0-9 Units  0-9 Units Subcutaneous TID WC Reyne Dumas, MD   2 Units at 06/29/16 1912  . metoprolol tartrate (LOPRESSOR) tablet 12.5 mg  12.5 mg Oral BID Lelon Perla, MD      . ondansetron [facility]) tablet 4 mg  4 mg Oral Q6H PRN Reyne Dumas, MD       Or  . ondansetron (ZOFRAN) injection 4 mg  4 mg Intravenous Q6H PRN Reyne Dumas, MD      . sevelamer carbonate (RENVELA) tablet 1,600 mg  1,600 mg Oral TID WC Reyne Dumas, MD      . sodium chloride flush (NS) 0.9 % injection 3 mL  3 mL Intravenous Q12H Nayana Abrol, MD      . sodium chloride flush (NS) 0.9 % injection 3 mL  3 mL Intravenous Q12H  Reyne Dumas, MD      . sodium chloride flush (NS) 0.9 % injection 3 mL  3 mL Intravenous PRN Reyne Dumas, MD      . Derrill Memo ON 07/01/2016] vancomycin (VANCOCIN) 500 mg in sodium chloride 0.9 % 100 mL IVPB  500 mg Intravenous Q M,W,F-HD Jake Church Masters, [facility]       Labs: Basic Metabolic Panel:  Recent Labs Lab 06/29/16 1341 06/30/16 0200  NA 137 135  K 2.9* 3.4*  CL 97* 98*  CO2 27 29  GLUCOSE 120* 125*  BUN 8 18  CREATININE 2.88* 4.20*  CALCIUM 8.9 8.2*   Liver Function Tests:  Recent Labs Lab 06/29/16 1341 06/30/16 0200  AST 16 15  ALT 7* 7*  ALKPHOS 72 64  BILITOT 0.5 0.4  PROT 7.1 6.1*  ALBUMIN 3.6 3.0*   No results for input(s): LIPASE, AMYLASE in the last 168 hours. No results for input(s): AMMONIA in the last 168 hours. CBC:  Recent Labs Lab 06/29/16 1341 06/30/16 0200  WBC 4.6 5.2  NEUTROABS 2.2  --   HGB 10.6* 9.7*  HCT 32.2* 29.5*  MCV 88.7 88.1  PLT 182 172   Cardiac Enzymes:  Recent Labs Lab 06/29/16 1623 06/29/16 2201 06/30/16 0200  TROPONINI 0.81* 0.75* 0.76*   CBG:  Recent Labs Lab 06/29/16 1846 06/29/16 2024 06/30/16 0749 06/30/16 1111 06/30/16 1208  GLUCAP 170* 146* 82 81 91   Iron Studies: No results for input(s): IRON, TIBC, TRANSFERRIN, FERRITIN in the last 72 hours. Studies/Results: Dg Chest 2 View  Result Date: 06/29/2016 CLINICAL DATA:  Two weeks of chest pain and shortness of breath. History of hypertension, diabetes, nonsmoker. EXAM: CHEST  2 VIEW COMPARISON:  PA and lateral chest x-ray of December 11, 2015. FINDINGS: The lungs are reasonably well inflated the interstitial markings are mildly increased chronically. There are coarse retrocardiac lung markings on the left which are more conspicuous than in the past. The heart is top-normal in size.  The pulmonary vascularity is not engorged. There is calcification in the wall of the aortic arch. There is no pleural effusion. The bony thorax exhibits no acute abnormality. There  are chronic degenerative changes of the right glenohumeral joint. IMPRESSION: Atelectasis or infiltrate in the left lower lobe superimposed upon chronic fibrotic changes. No evidence of CHF. Followup PA and lateral chest X-ray is recommended in 3-4 weeks following trial of antibiotic therapy to ensure resolution and exclude underlying malignancy. Electronically Signed   By: David  Martinique M.D.   On: 06/29/2016 13:02    ROS: As per HPI otherwise negative.  Physical Exam: Vitals:   06/30/16 1135 06/30/16 1215 06/30/16 1255 06/30/16 1315  BP: (!) 87/46 (!) 100/42 (!) 93/56 (!) 98/45  Pulse: 74 75 77 80  Resp: 16 11 15 16   Temp:      TempSrc:      SpO2: 100% 100% 92% 96%  Weight:      Height:         General: WDWN NAD Head: NCAT  MMM Neck: Supple.  Lungs:  Breathing is unlabored. Bibasliar crackles L>R Heart: RRR with S1 S2. holosytolic murmur  Abdomen: soft NT + BS Lower extremities: bilat BKA - sites clean no edema  Neuro: A & O  X 3. Moves all extremities spontaneously. Psych:  Responds to questions appropriately with a normal affect. Dialysis Access: LUE AVF +thrill/bruit  Dialysis Orders:   [facility] MWF 4h F180 BFR 400 2K/2.25 Ca UF Profile 4 EDW 50kg L AVF Hectorol 30mcg q HD Venofer 50mg  IV q week (last 10/25) Mircera 50 mcg IV q 2 weeks (last 10/18) OP Labs: Hgb 10.2 Tsat 26%  K 3.8 Corr Ca 9.3 PTH 514   Assessment/Plan: 1. NSTEMI - per primary cards following - EKG with nonspecific ST T segment changes - for cardiac cath today  2. PNA- CXR with possible infiltrate in LLB - w/u per primary - on IV vanc/cefeipime blood/sputum cx pending  3.  ESRD -  Cont on MWF schedule K 3.4 HD tomorrow with 4K bath  4.  Hypertension/volume  - BP controlled - on metoprolol no edema on exam UF goal 2L  5.  Anemia  - Hgb 9.7 Will give Aranesp 168mcg IV with HD tomorrow  6.  Metabolic bone disease -  Ca 8.2 - cont VDRA/ No OP binders  7.  Nutrition - Albumin 3.0 renal vitamins/protein  supp 8. DM II - per primary   Lynnda Child PA-C [place] Pager [phone] 06/30/2016, 1:43 PM   Pt seen, examined and agree w A/P as above.  Kelly Splinter MD [employer] pager [phone]   06/30/2016, 3:49 PM

## 2016-06-30 NOTE — Progress Notes (Signed)
    Subjective:  Denies CP; mild dyspnea   Objective:  Vitals:   06/29/16 1746 06/29/16 2015 06/29/16 2210 06/30/16 0522  BP: 122/64 112/70 (!) 93/40 (!) 89/45  Pulse: 77 74 75 70  Resp: 18 19  17   Temp: 98.4 F (36.9 C) 98.3 F (36.8 C)  98.4 F (36.9 C)  TempSrc: Oral Oral  Oral  SpO2: 100% 100%  98%  Weight:  124 lb (56.2 kg)    Height:        Intake/Output from previous day:  Intake/Output Summary (Last 24 hours) at 06/30/16 0800 Last data filed at 06/30/16 0524  Gross per 24 hour  Intake              490 ml  Output                0 ml  Net              490 ml    Physical Exam: Physical exam: Well-developed chronically ill appearing in no acute distress.  Skin is warm and dry.  HEENT is normal.  Neck is supple.  Chest is clear to auscultation with normal expansion.  Cardiovascular exam is regular rate and rhythm.  Abdominal exam nontender or distended. No masses palpated. Extremities s/p bilateral BKA neuro grossly intact    Lab Results: Basic Metabolic Panel:  Recent Labs  06/29/16 1341 06/29/16 1631 06/30/16 0200  NA 137  --  135  K 2.9*  --  3.4*  CL 97*  --  98*  CO2 27  --  29  GLUCOSE 120*  --  125*  BUN 8  --  18  CREATININE 2.88*  --  4.20*  CALCIUM 8.9  --  8.2*  MG  --  1.9  --    CBC:  Recent Labs  06/29/16 1341 06/30/16 0200  WBC 4.6 5.2  NEUTROABS 2.2  --   HGB 10.6* 9.7*  HCT 32.2* 29.5*  MCV 88.7 88.1  PLT 182 172   Cardiac Enzymes:  Recent Labs  06/29/16 1623 06/29/16 2201 06/30/16 0200  TROPONINI 0.81* 0.75* 0.76*     Assessment/Plan:  65 year old female with end-stage renal disease, peripheral vascular disease, diabetes mellitus admitted with exertional dyspnea and atypical chest pain. Troponin abnormal consistent with non-ST elevation myocardial infarction.  1 non-ST elevation myocardial infarction-patient presents with progressive dyspnea on exertion and atypical chest pain. Her troponins are elevated  though no clear trend. Regardless she has multiple risk factors including greater than 20 years of diabetes mellitus and peripheral vascular disease. She needs definitive evaluation. Plan cardiac catheterization today. The risks and benefits including myocardial infarction, CVA and death discussed and she agrees to proceed. Continue aspirin, heparin and statin. Continue low dose metoprolol if BP allows.  2 hyperlipidemia-continue Lipitor 80 mg daily. Check lipids and liver in 4 weeks.   3 hypertension-Contunue Lopressor 12.5 mg twice a day in the setting of non-ST elevation myocardial infarction as tolerated by BP.  4 End-stage renal disease-dialysis per nephrology.   5 question pneumonia-clinically does not appear to have pneumonia. Management per primary care.   Kirk Ruths 06/30/2016, 8:00 AM

## 2016-06-30 NOTE — Progress Notes (Signed)
[place] for heparin Indication: chest pain/ACS  Allergies  Allergen Reactions  . Oxycodone Itching    Patient Measurements: Height: 4\' 9"  (144.8 cm) Weight: 124 lb (56.2 kg) IBW/kg (Calculated) : 38.6 Heparin Dosing Weight: 50.8 kg  Vital Signs: Temp: 99.4 F (37.4 C) (10/31 0920) Temp Source: Oral (10/31 0920) BP: 98/45 (10/31 1315) Pulse Rate: 80 (10/31 1315)  Labs:  Recent Labs  06/29/16 1341 06/29/16 1623 06/29/16 2201 06/30/16 0200  HGB 10.6*  --   --  9.7*  HCT 32.2*  --   --  29.5*  PLT 182  --   --  172  LABPROT  --   --   --  13.9  INR  --   --   --  1.06  HEPARINUNFRC  --   --   --  0.16*  CREATININE 2.88*  --   --  4.20*  TROPONINI  --  0.81* 0.75* 0.76*    Estimated Creatinine Clearance: 9.7 mL/min (by C-G formula based on SCr of 4.2 mg/dL (H)).  ESRD   Assessment: 24 YOF started on heparin for ACS/STEMI. Pt not on any PTA anticoagulation. She has had SOB and CP over past two weeks.  Patient went to cath this morning and is to be evaluated for bypass surgery. To restart heparin 8h after sheath removal. Sheath out at 1055.  Heparin level last night was low and she was bolused and rate increased- level not checked prior to her going to cath. Hgb 9.7, plts 172- no bleeding noted.  Goal of Therapy:  Heparin level 0.3-0.7 units/ml Monitor platelets by anticoagulation protocol: Yes   Plan:  Restart heparin at 750 units/hr at 1930 tonight Daily heparin level and CBC Follow surgical plans and s/s bleeding  Lidia Clavijo D. Tritia Endo, PharmD, BCPS Clinical Pharmacist Pager: [phone] 06/30/2016 1:54 PM

## 2016-06-30 NOTE — Interval H&P Note (Signed)
History and Physical Interval Note:  06/30/2016 10:16 AM  Carolyn Valenzuela  has presented today for cardiac cath with the diagnosis of unstable angina/NSTEMI.  The various methods of treatment have been discussed with the patient and family. After consideration of risks, benefits and other options for treatment, the patient has consented to  Procedure(s): Left Heart Cath and Coronary Angiography (N/A) as a surgical intervention .  The patient's history has been reviewed, patient examined, no change in status, stable for surgery.  I have reviewed the patient's chart and labs.  Questions were answered to the patient's satisfaction.    Cath Lab Visit (complete for each Cath Lab visit)  Clinical Evaluation Leading to the Procedure:   ACS: Yes.    Non-ACS:    Anginal Classification: CCS II  Anti-ischemic medical therapy: Minimal Therapy (1 class of medications)  Non-Invasive Test Results: No non-invasive testing performed  Prior CABG: No previous CABG        Lauree Chandler

## 2016-07-01 ENCOUNTER — Inpatient Hospital Stay (HOSPITAL_COMMUNITY): Payer: Medicare Other

## 2016-07-01 DIAGNOSIS — E1122 Type 2 diabetes mellitus with diabetic chronic kidney disease: Secondary | ICD-10-CM

## 2016-07-01 DIAGNOSIS — I351 Nonrheumatic aortic (valve) insufficiency: Secondary | ICD-10-CM

## 2016-07-01 DIAGNOSIS — D631 Anemia in chronic kidney disease: Secondary | ICD-10-CM

## 2016-07-01 LAB — COMPREHENSIVE METABOLIC PANEL
ALT: 12 U/L — ABNORMAL LOW (ref 14–54)
AST: 29 U/L (ref 15–41)
Albumin: 2.8 g/dL — ABNORMAL LOW (ref 3.5–5.0)
Alkaline Phosphatase: 54 U/L (ref 38–126)
Anion gap: 12 (ref 5–15)
BUN: 29 mg/dL — ABNORMAL HIGH (ref 6–20)
CO2: 23 mmol/L (ref 22–32)
Calcium: 8 mg/dL — ABNORMAL LOW (ref 8.9–10.3)
Chloride: 95 mmol/L — ABNORMAL LOW (ref 101–111)
Creatinine, Ser: 6.51 mg/dL — ABNORMAL HIGH (ref 0.44–1.00)
GFR calc Af Amer: 7 mL/min — ABNORMAL LOW (ref >60)
GFR calc non Af Amer: 6 mL/min — ABNORMAL LOW (ref >60)
Glucose, Bld: 189 mg/dL — ABNORMAL HIGH (ref 65–99)
Potassium: 4.3 mmol/L (ref 3.5–5.1)
Sodium: 130 mmol/L — ABNORMAL LOW (ref 135–145)
Total Bilirubin: 0.7 mg/dL (ref 0.3–1.2)
Total Protein: 5.2 g/dL — ABNORMAL LOW (ref 6.5–8.1)

## 2016-07-01 LAB — GLUCOSE, CAPILLARY
Glucose-Capillary: 111 mg/dL — ABNORMAL HIGH (ref 65–99)
Glucose-Capillary: 122 mg/dL — ABNORMAL HIGH (ref 65–99)
Glucose-Capillary: 214 mg/dL — ABNORMAL HIGH (ref 65–99)
Glucose-Capillary: 288 mg/dL — ABNORMAL HIGH (ref 65–99)

## 2016-07-01 LAB — CBC
HCT: 28.9 % — ABNORMAL LOW (ref 36.0–46.0)
Hemoglobin: 9.5 g/dL — ABNORMAL LOW (ref 12.0–15.0)
MCH: 29.1 pg (ref 26.0–34.0)
MCHC: 32.9 g/dL (ref 30.0–36.0)
MCV: 88.4 fL (ref 78.0–100.0)
Platelets: 167 10*3/uL (ref 150–400)
RBC: 3.27 MIL/uL — ABNORMAL LOW (ref 3.87–5.11)
RDW: 17.4 % — ABNORMAL HIGH (ref 11.5–15.5)
WBC: 6.5 10*3/uL (ref 4.0–10.5)

## 2016-07-01 LAB — ECHOCARDIOGRAM COMPLETE
Height: 57 in
Weight: 1830.7 oz

## 2016-07-01 LAB — PROCALCITONIN: Procalcitonin: 0.57 ng/mL

## 2016-07-01 LAB — HEPARIN LEVEL (UNFRACTIONATED)
Heparin Unfractionated: 0.43 IU/mL (ref 0.30–0.70)
Heparin Unfractionated: 0.74 IU/mL — ABNORMAL HIGH (ref 0.30–0.70)

## 2016-07-01 MED ORDER — ALBUMIN HUMAN 25 % IV SOLN
12.5000 g | Freq: Once | INTRAVENOUS | Status: AC
  Administered 2016-07-01: 12.5 g via INTRAVENOUS

## 2016-07-01 MED ORDER — DARBEPOETIN ALFA 100 MCG/0.5ML IJ SOSY
PREFILLED_SYRINGE | INTRAMUSCULAR | Status: AC
  Filled 2016-07-01: qty 0.5

## 2016-07-01 MED ORDER — ALBUMIN HUMAN 25 % IV SOLN
INTRAVENOUS | Status: AC
  Filled 2016-07-01: qty 100

## 2016-07-01 MED ORDER — FAMOTIDINE 20 MG PO TABS
40.0000 mg | ORAL_TABLET | Freq: Two times a day (BID) | ORAL | Status: DC
  Administered 2016-07-01 (×2): 40 mg via ORAL
  Filled 2016-07-01 (×2): qty 2

## 2016-07-01 MED ORDER — ACETAMINOPHEN 325 MG PO TABS
ORAL_TABLET | ORAL | Status: AC
  Filled 2016-07-01: qty 2

## 2016-07-01 MED ORDER — LEVOFLOXACIN IN D5W 750 MG/150ML IV SOLN
750.0000 mg | Freq: Once | INTRAVENOUS | Status: AC
Start: 2016-07-01 — End: 2016-07-01
  Administered 2016-07-01: 750 mg via INTRAVENOUS
  Filled 2016-07-01: qty 150

## 2016-07-01 MED ORDER — LIDOCAINE HCL (PF) 1 % IJ SOLN: 5.0000 mL | INTRAMUSCULAR | Status: DC | PRN

## 2016-07-01 MED ORDER — DOXERCALCIFEROL 4 MCG/2ML IV SOLN
INTRAVENOUS | Status: AC
  Filled 2016-07-01: qty 2

## 2016-07-01 MED ORDER — LEVOFLOXACIN IN D5W 500 MG/100ML IV SOLN
500.0000 mg | INTRAVENOUS | Status: DC
  Filled 2016-07-01: qty 100

## 2016-07-01 MED ORDER — VANCOMYCIN HCL IN DEXTROSE 500-5 MG/100ML-% IV SOLN
INTRAVENOUS | Status: AC
  Administered 2016-07-01: 500 mg
  Filled 2016-07-01: qty 100

## 2016-07-01 MED ORDER — SODIUM CHLORIDE 0.9 % IV SOLN: 100.0000 mL | INTRAVENOUS | Status: DC | PRN

## 2016-07-01 MED ORDER — CLOPIDOGREL BISULFATE 300 MG PO TABS
600.0000 mg | ORAL_TABLET | Freq: Once | ORAL | Status: DC
  Filled 2016-07-01: qty 2

## 2016-07-01 MED ORDER — HEPARIN SODIUM (PORCINE) 1000 UNIT/ML DIALYSIS
20.0000 [IU]/kg | INTRAMUSCULAR | Status: DC | PRN
  Administered 2016-07-01: 1100 [IU] via INTRAVENOUS_CENTRAL
  Filled 2016-07-01: qty 2

## 2016-07-01 MED ORDER — PENTAFLUOROPROP-TETRAFLUOROETH EX AERO: 1.0000 "application " | INHALATION_SPRAY | CUTANEOUS | Status: DC | PRN

## 2016-07-01 MED ORDER — LIDOCAINE-PRILOCAINE 2.5-2.5 % EX CREA: 1.0000 "application " | TOPICAL_CREAM | CUTANEOUS | Status: DC | PRN

## 2016-07-01 MED ORDER — HEPARIN SODIUM (PORCINE) 1000 UNIT/ML DIALYSIS: 1000.0000 [IU] | INTRAMUSCULAR | Status: DC | PRN

## 2016-07-01 NOTE — Progress Notes (Signed)
Pharmacy Antibiotic Note  Carolyn Valenzuela is a 65 y.o. female admitted on 06/29/2016 with CP and SOB on exertion. The patient was started on Vancomycin + Cefepime for rule out PNA - now to discontinue and transition to Levaquin.   Plan: 1. Levaquin 750 mg IV x 1 followed by 500 mg IV every 48 hours 2. Will continue to follow renal function, culture results, LOT, and antibiotic de-escalation plans   Height: 4\' 9"  (144.8 cm) Weight: 114 lb 6.7 oz (51.9 kg) IBW/kg (Calculated) : 38.6  Temp (24hrs), Avg:98.2 F (36.8 C), Min:97.5 F (36.4 C), Max:99.3 F (37.4 C)   Recent Labs Lab 06/29/16 1341 06/29/16 1352 06/30/16 0200 07/01/16 0519  WBC 4.6  --  5.2 6.5  CREATININE 2.88*  --  4.20* 6.51*  LATICACIDVEN  --  1.31  --   --     Estimated Creatinine Clearance: 6.1 mL/min (by C-G formula based on SCr of 6.51 mg/dL (H)).    Allergies  Allergen Reactions  . Oxycodone Itching    Antimicrobials this admission: Vancomycin 10/30 >> 11/1 * Doses: 1g LD on 10/30 (a little low, est level ~20 mcg/ml), 500 mg on 11/1 (post-HD) * HD sessions 11/1 (4 hr BFR 400) Cefepime 10/30 >> 11/1 Levaquin 11/1 >>  Dose adjustments this admission:  N/A  Microbiology results:  10/30 BCx >> ngtd 10/31 MRSA PCR: neg Sputum: ordered  Thank you for allowing pharmacy to be a part of this patient's care.  Alycia Rossetti, PharmD, BCPS Clinical Pharmacist Pager: [phone] Clinical phone for 07/01/2016 from 7a-3:30p: [phone] If after 3:30p, please call main pharmacy at: [phone] 07/01/2016 1:44 PM

## 2016-07-01 NOTE — Evaluation (Signed)
Clinical/Bedside Swallow Evaluation Patient Details  Name: Carolyn Valenzuela MRN: [id number] Date of Birth: [birth year]  Today's Date: 07/01/2016 Time: SLP Start Time (ACUTE ONLY): 3762 SLP Stop Time (ACUTE ONLY): 1432 SLP Time Calculation (min) (ACUTE ONLY): 16 min  Past Medical History:  Past Medical History:  Diagnosis Date  . Allergic rhinitis, cause unspecified 12/08/2010  . ANEMIA-IRON DEFICIENCY 01/25/2008  . BACK PAIN 05/02/2009  . CERVICAL RADICULOPATHY, LEFT 01/25/2008  . Critical lower limb ischemia   . DIABETES MELLITUS, UNCONTROLLED 05/21/2009  . Foot ulcer ([place])    right lateral malleolus  . HYPERLIPIDEMIA 03/30/2007  . HYPERTENSION 01/25/2008  . NUMBNESS 05/21/2009  . RENAL INSUFFICIENCY 02/01/2008   HD pt.  . SECONDARY HYPERPARATHYROIDISM 05/21/2009  . SINUSITIS- ACUTE-NOS 01/25/2008   Past Surgical History:  Past Surgical History:  Procedure Laterality Date  . AMPUTATION Right 03/15/2014   Procedure: RIGHT  LEG  BELOW KNEE AMPUTATION ;  Surgeon: [name], MD;  Location: [facility];  Service: Orthopedics;  Laterality: Right;  . AMPUTATION Left 11/29/2015   Procedure: AMPUTATION BELOW KNEE;  Surgeon: Newt Minion, MD;  Location: [facility];  Service: Orthopedics;  Laterality: Left;  . AV FISTULA PLACEMENT Left   . AV FISTULA PLACEMENT Left 09/06/2014   Procedure: ARTERIOVENOUS (AV) FISTULA CREATION-Left Brachiocephalic;  Surgeon: Conrad Fountain, MD;  Location: [facility];  Service: Vascular;  Laterality: Left;  . CARDIAC CATHETERIZATION N/A 06/30/2016   Procedure: Left Heart Cath and Coronary Angiography;  Surgeon: Burnell Blanks, MD;  Location: [facility];  Service: Cardiovascular;  Laterality: N/A;  . COLONOSCOPY    . ESOPHAGOGASTRODUODENOSCOPY N/A 03/20/2016   Procedure: ESOPHAGOGASTRODUODENOSCOPY (EGD);  Surgeon: Milus Banister, MD;  Location: [facility];  Service: Endoscopy;  Laterality: N/A;  . EYE SURGERY Bilateral    cataracts removed, left eye still has some oil in  it.  . INSERTION OF DIALYSIS CATHETER N/A 09/03/2014   Procedure: INSERTION OF DIALYSIS CATHETER RIGHT INTERNAL JUGULAR VEIN;  Surgeon: Conrad Orwin, MD;  Location: [facility];  Service: Vascular;  Laterality: N/A;  . PERIPHERAL VASCULAR CATHETERIZATION N/A 08/13/2015   Procedure: Abdominal Aortogram;  Surgeon: Elam Dutch, MD;  Location: [place] [facility];  Service: Cardiovascular;  Laterality: N/A;   HPI:  65 year old female with end-stage renal disease(HD initiated 1/2-14/2016) MWF at [facility], diabetes, hypertension, dyslipidemia, peripheral vascular disease , who presents to the ER after dialysis due to complaints of worsening shortness of breath with exertion for the last 2 weeks. Patient also describes mid chest/epigastric pain for the past 2 weeks. She states that the chest pain radiates to both her shoulders. She is unable to specify what precipitates her chest pain. She denies any fever, cough. She states that the chest pain can occur at any time, even at rest. Prior history of intermittent dysphagia to solids and liquids. Recent upper endoscopy 03/19/16 showed grade C ulcerative reflux type esophagitis with surrounding edema. Chest x-ray atelectasis vs infiltrate in the left lower lobe. No evidence of CHF. Esophagram shows stricture.    Assessment / Plan / Recommendation Clinical Impression  Pt upright in bed, consumed trials of thin liquids, pureed and regular solids with no overt s/s of aspiration. Pt had trace oral residue following regular solids that cleared with additional sips of thin liquid. Recommend regular diet and thin liquids. No additional ST necessary at this time.     Aspiration Risk  Mild aspiration risk    Diet Recommendation Regular;Thin  liquid   Liquid Administration via: Cup;Straw;Spoon Medication Administration: Whole meds with liquid Supervision: Intermittent supervision to cue for compensatory strategies;Patient able to self  feed Compensations: Minimize environmental distractions;Slow rate;Small sips/bites;Follow solids with liquid Postural Changes: Seated upright at 90 degrees;Remain upright for at least 30 minutes after po intake    Other  Recommendations Oral Care Recommendations: Oral care BID   Follow up Recommendations 24 hour supervision/assistance      Frequency and Duration            Prognosis Prognosis for Safe Diet Advancement: Good      Swallow Study   General HPI: 65 year old female with end-stage renal disease(HD initiated 1/2-14/2016) MWF at [facility], diabetes, hypertension, dyslipidemia, peripheral vascular disease , who presents to the ER after dialysis due to complaints of worsening shortness of breath with exertion for the last 2 weeks. Patient also describes mid chest/epigastric pain for the past 2 weeks. She states that the chest pain radiates to both her shoulders. She is unable to specify what precipitates her chest pain. She denies any fever, cough. She states that the chest pain can occur at any time, even at rest. Prior history of intermittent dysphagia to solids and liquids. Recent upper endoscopy 03/19/16 showed grade C ulcerative reflux type esophagitis with surrounding edema. Chest x-ray atelectasis vs infiltrate in the left lower lobe. No evidence of CHF. Esophagram shows stricture.  Type of Study: Bedside Swallow Evaluation Previous Swallow Assessment: BSE (12/17/15) Diet Prior to this Study: NPO Temperature Spikes Noted: No Respiratory Status: Nasal cannula History of Recent Intubation: No Behavior/Cognition: Alert;Cooperative;Confused;Requires cueing Oral Cavity Assessment: Within Functional Limits Oral Care Completed by SLP: No Oral Cavity - Dentition: Edentulous;Other (Comment) (top dentures available ) Vision: Functional for self-feeding Self-Feeding Abilities: Able to feed self;Needs assist Patient Positioning: Upright in bed Baseline Vocal  Quality: Normal    Oral/Motor/Sensory Function Overall Oral Motor/Sensory Function: Within functional limits   Ice Chips Ice chips: Not tested   Thin Liquid Thin Liquid: Within functional limits Presentation: Cup;Straw    Nectar Thick Nectar Thick Liquid: Not tested   Honey Thick Honey Thick Liquid: Not tested   Puree Puree: Within functional limits Presentation: Spoon;Self Fed   Solid   GO   Solid: Impaired Presentation: Self Fed Oral Phase Functional Implications: Oral residue       Ezekiel Slocumb, Student SLP  Shela Leff 07/01/2016,3:32 PM

## 2016-07-01 NOTE — Progress Notes (Signed)
RN called for hypotension BP 74/56. Advised RN to start 250cc bolus and page MD. BP increased to 91/46. MD gave order for additional 250 cc bolus. BP increased to 99/53. Pt's average SBP 80-90's.  Pt alert answering questions appropriately. Skin warm and dry.

## 2016-07-01 NOTE — Progress Notes (Signed)
[place] for heparin Indication: chest pain/ACS  Allergies  Allergen Reactions  . Oxycodone Itching    Patient Measurements: Height: 4\' 9"  (144.8 cm) Weight: 114 lb 6.7 oz (51.9 kg) IBW/kg (Calculated) : 38.6 Heparin Dosing Weight: 50.8 kg  Vital Signs: Temp: 98.2 F (36.8 C) (11/01 1239) Temp Source: Oral (11/01 1239) BP: 86/46 (11/01 1239) Pulse Rate: 73 (11/01 1239)  Labs:  Recent Labs  06/29/16 1341 06/29/16 1623 06/29/16 2201 06/30/16 0200 07/01/16 0519 07/01/16 1556  HGB 10.6*  --   --  9.7* 9.5*  --   HCT 32.2*  --   --  29.5* 28.9*  --   PLT 182  --   --  172 167  --   LABPROT  --   --   --  13.9  --   --   INR  --   --   --  1.06  --   --   HEPARINUNFRC  --   --   --  0.16* 0.43 0.74*  CREATININE 2.88*  --   --  4.20* 6.51*  --   TROPONINI  --  0.81* 0.75* 0.76*  --   --     Estimated Creatinine Clearance: 6.1 mL/min (by C-G formula based on SCr of 6.51 mg/dL (H)).    Assessment: 75 YOF with bilateral bka's started on heparin for ACS/STEMI. Pt not on any PTA anticoagulation. She has had SOB and CP over past two weeks.  Patient s/p cath 10/31 and is being evaluated by [facility] for options. Heparin level supratherapeutic (0.74) on gtt at 750 units/hr. Hemoglobin trending down, ptlc wnl. No bleeding per RN.  Goal of Therapy:  Heparin level 0.3-0.7 units/ml Monitor platelets by anticoagulation protocol: Yes   Plan:  Decrease heparin to 700 units/hr Heparin level at 0100 Monitor s/sx of bleeding, H/H, platelets  Dierdre Harness, BS, PharmD Clinical Pharmacy Resident [phone] (Pager) 07/01/2016 5:02 PM

## 2016-07-01 NOTE — Progress Notes (Addendum)
    Subjective:  Denies CP; mild dyspnea   Objective:  Vitals:   06/30/16 2331 07/01/16 0246 07/01/16 0635 07/01/16 0740  BP: (!) 99/53 (!) 85/38 (!) 77/51 (!) (P) 79/52  Pulse: 77 75 76 (P) 70  Resp: 16 15 15  (P) 16  Temp: 97.7 F (36.5 C)  98.6 F (37 C) (P) 98.6 F (37 C)  TempSrc: Oral  Oral (P) Oral  SpO2: 95% 92% 100% (P) 98%  Weight:      Height:        Intake/Output from previous day:  Intake/Output Summary (Last 24 hours) at 07/01/16 0815 Last data filed at 07/01/16 0102  Gross per 24 hour  Intake              240 ml  Output                0 ml  Net              240 ml    Physical Exam: Physical exam: Well-developed chronically ill appearing in no acute distress.  Skin is warm and dry.  HEENT is normal.  Neck is supple.  Chest is clear to auscultation with normal expansion.  Cardiovascular exam is regular rate and rhythm. 2/6 systolic murmur Abdominal exam nontender or distended. No masses palpated. Extremities s/p bilateral BKA; right groin with no hematoma neuro grossly intact    Lab Results: Basic Metabolic Panel:  Recent Labs  06/29/16 1631 06/30/16 0200 07/01/16 0519  NA  --  135 130*  K  --  3.4* 4.3  CL  --  98* 95*  CO2  --  29 23  GLUCOSE  --  125* 189*  BUN  --  18 29*  CREATININE  --  4.20* 6.51*  CALCIUM  --  8.2* 8.0*  MG 1.9  --   --    CBC:  Recent Labs  06/29/16 1341 06/30/16 0200 07/01/16 0519  WBC 4.6 5.2 6.5  NEUTROABS 2.2  --   --   HGB 10.6* 9.7* 9.5*  HCT 32.2* 29.5* 28.9*  MCV 88.7 88.1 88.4  PLT 182 172 167   Cardiac Enzymes:  Recent Labs  06/29/16 1623 06/29/16 2201 06/30/16 0200  TROPONINI 0.81* 0.75* 0.76*     Assessment/Plan:  65 year old female with end-stage renal disease, peripheral vascular disease, diabetes mellitus admitted with exertional dyspnea and atypical chest pain. Troponin abnormal consistent with non-ST elevation myocardial infarction. Cardiac cath with 2 vessel CAD. Thought  not to be a candidate for CABG per Dr Servando Snare. Will reorder echo.  1 non-ST elevation myocardial infarction-Not a candidate for CABG. Discussed with Dr Angelena Form; plan PCI of Lcx. The risks and benefits including myocardial infarction, CVA and death discussed and she agrees to proceed. Continue aspirin, heparin and statin. DC lopressor as she is hypotensive.  2 hyperlipidemia-continue Lipitor 80 mg daily. Check lipids and liver in 4 weeks.   3 hypertension-DC lopressor as she is hypertensive.  4 End-stage renal disease-dialysis per nephrology.   5 question pneumonia-clinically does not appear to have pneumonia. Management per primary care.   Carolyn Valenzuela 07/01/2016, 8:15 AM

## 2016-07-01 NOTE — Progress Notes (Signed)
[facility] PROGRESS NOTE  TIERA MENSINGER LPF:[id number] DOB: August 12, 1951 DOA: 06/29/2016  PCP: Cathlean Cower, MD  Brief History/Interval Summary: 65 year old African-American female with a past medical history of end-stage renal disease on hemodialysis, diabetes, hypertension, peripheral vascular disease, presented to the emergency department after her dialysis due to complaints of worsening shortness of breath. She also mention chest pain. EKG showed nonspecific ST and T wave changes. Troponin was mildly elevated. Patient was seen by cardiology and underwent cardiac catheterization which showed severe diffuse CAD. Plan is for PCI to the circumflex.  Reason for Visit: Coronary artery disease  Consultants: Nephrology. Cardiology. Cardiothoracic surgery.  Procedures: Hemodialysis. Cardiac catheterization  Antibiotics: Vancomycin and cefepime to be changed to Levaquin on 11/1  Subjective/Interval History: Patient seen at hemodialysis. She feels well. Denies any chest pain. Has had some shortness of breath.   ROS: Denies any nausea or vomiting.  Objective:  Vital Signs  Vitals:   07/01/16 1100 07/01/16 1128 07/01/16 1145 07/01/16 1239  BP: (!) 100/55 (!) 96/55 (!) 77/41 (!) 86/46  Pulse: 81 81 71 73  Resp: 15 16 16    Temp:   97.5 F (36.4 C) 98.2 F (36.8 C)  TempSrc:    Oral  SpO2:   100% 100%  Weight:   51.9 kg (114 lb 6.7 oz)   Height:        Intake/Output Summary (Last 24 hours) at 07/01/16 1335 Last data filed at 07/01/16 0102  Gross per 24 hour  Intake              240 ml  Output                0 ml  Net              240 ml   Filed Weights   06/29/16 2015 07/01/16 0740 07/01/16 1145  Weight: 56.2 kg (124 lb) 53.9 kg (118 lb 13.3 oz) 51.9 kg (114 lb 6.7 oz)    General appearance: alert, cooperative, appears stated age and no distress Resp: clear to auscultation bilaterally Cardio: regular rate and rhythm, S1, S2 normal, no murmur, click, rub or  gallop GI: soft, non-tender; bowel sounds normal; no masses,  no organomegaly Extremities: extremities normal, atraumatic, no cyanosis or edema Neurologic: Awake and alert. Oriented 3. No focal neurological deficits.  Lab Results:  Data Reviewed: I have personally reviewed following labs and imaging studies  CBC:  Recent Labs Lab 06/29/16 1341 06/30/16 0200 07/01/16 0519  WBC 4.6 5.2 6.5  NEUTROABS 2.2  --   --   HGB 10.6* 9.7* 9.5*  HCT 32.2* 29.5* 28.9*  MCV 88.7 88.1 88.4  PLT 182 172 532    Basic Metabolic Panel:  Recent Labs Lab 06/29/16 1341 06/29/16 1631 06/30/16 0200 07/01/16 0519  NA 137  --  135 130*  K 2.9*  --  3.4* 4.3  CL 97*  --  98* 95*  CO2 27  --  29 23  GLUCOSE 120*  --  125* 189*  BUN 8  --  18 29*  CREATININE 2.88*  --  4.20* 6.51*  CALCIUM 8.9  --  8.2* 8.0*  MG  --  1.9  --   --     GFR: Estimated Creatinine Clearance: 6.1 mL/min (by C-G formula based on SCr of 6.51 mg/dL (H)).  Liver Function Tests:  Recent Labs Lab 06/29/16 1341 06/30/16 0200 07/01/16 0519  AST 16 15 29   ALT 7* 7* 12*  ALKPHOS 72 64 54  BILITOT 0.5 0.4 0.7  PROT 7.1 6.1* 5.2*  ALBUMIN 3.6 3.0* 2.8*    Coagulation Profile:  Recent Labs Lab 06/30/16 0200  INR 1.06    Cardiac Enzymes:  Recent Labs Lab 06/29/16 1623 06/29/16 2201 06/30/16 0200  TROPONINI 0.81* 0.75* 0.76*    CBG:  Recent Labs Lab 06/30/16 1208 06/30/16 1623 06/30/16 2134 07/01/16 1207 07/01/16 1250  GLUCAP 91 160* 114* 111* 122*     Recent Results (from the past 240 hour(s))  Culture, blood (routine x 2)     Status: None (Preliminary result)   Collection Time: 06/29/16  1:45 PM  Result Value Ref Range Status   Specimen Description BLOOD RIGHT HAND  Final   Special Requests IN PEDIATRIC BOTTLE 4CC  Final   Culture NO GROWTH < 24 HOURS  Final   Report Status PENDING  Incomplete  Culture, blood (routine x 2)     Status: None (Preliminary result)   Collection Time:  06/29/16  1:50 PM  Result Value Ref Range Status   Specimen Description BLOOD RIGHT ARM  Final   Special Requests BOTTLES DRAWN AEROBIC AND ANAEROBIC 5CC  Final   Culture NO GROWTH < 24 HOURS  Final   Report Status PENDING  Incomplete  Surgical PCR screen     Status: None   Collection Time: 06/30/16  5:31 AM  Result Value Ref Range Status   MRSA, PCR NEGATIVE NEGATIVE Final   Staphylococcus aureus NEGATIVE NEGATIVE Final    Comment:        The Xpert SA Assay (FDA approved for NASAL specimens in patients over 61 years of age), is one component of a comprehensive surveillance program.  Test performance has been validated by [facility] for patients greater than or equal to 52 year old. It is not intended to diagnose infection nor to guide or monitor treatment.       Radiology Studies: No results found.   Medications:  Scheduled: . aspirin EC  81 mg Oral Daily  . atorvastatin  80 mg Oral q1800  . ceFEPime (MAXIPIME) IV  2 g Intravenous Q M,W,F-HD  . clopidogrel  600 mg Oral Once  . darbepoetin (ARANESP) injection - DIALYSIS  100 mcg Intravenous Q Wed-HD  . doxercalciferol  2 mcg Intravenous Q M,W,F-HD  . famotidine  40 mg Oral BID  . feeding supplement (PRO-STAT SUGAR FREE 64)  30 mL Oral BID  . gabapentin  100 mg Oral BID  . glipiZIDE  10 mg Oral Q breakfast  . insulin aspart  0-9 Units Subcutaneous TID WC  . multivitamin  1 tablet Oral QHS  . sevelamer carbonate  1,600 mg Oral TID WC  . sodium chloride flush  3 mL Intravenous Q12H  . vancomycin  500 mg Intravenous Q M,W,F-HD   Continuous: . heparin 750 Units/hr (06/30/16 2143)   GNF:AOZHYQ chloride, sodium chloride, sodium chloride, acetaminophen **OR** acetaminophen, ondansetron **OR** ondansetron (ZOFRAN) IV, sodium chloride flush  Assessment/Plan:  Active Problems:   Hyperlipidemia   Essential hypertension   S/P BKA (below knee amputation) ([place])   Type 2 diabetes mellitus with ESRD (end-stage renal  disease) ([place])   Anemia of renal disease   ESRD needing dialysis ([place])   HCAP (healthcare-associated pneumonia)   NSTEMI (non-ST elevated myocardial infarction) ([place])   Elevated troponin    Healthcare associated pneumonia Patient was started on vancomycin and cefepime based on x-ray findings. Ulcers have been negative. She is afebrile. WBCs normal. Faythe Ghee  to change to Levaquin, which will have to be renally dosed.   Non-ST elevation myocardial infarction Patient presents with progressive dyspnea on exertion and atypical chest pain. Concerning for unstable angina given multiple cardiovascular risk factors. Cardiology was consulted, s/p cath showed severe diffuse CAD. She is a diabetic on hemodialysis and the favorable approach to revascularization would be with bypass surgery. Currently on heparin gtt. seen by cardiothoracic surgery. Not a candidate for bypass. Plan is to try and intervene on the circumflex lesion. PCI is pending. Continue aspirin. Defer management to cardiology at this time. Echocardiogram has been ordered by them.  Hyperlipidemia Continue statin  Essential hypertension Continue outpatient medications  S/P bilateral BKA (below knee amputation)  Stable  Type 2 diabetes mellitus with ESRD Patient has been started on sliding scale insulin and Accu-Cheks. On oral glipizide, which will be held for now.  Anemia of renal disease Baseline hemoglobin 10.5, stable  ESRD needing dialysis Hemodialysis Monday Wednesday Friday. Nephrology is following.  History of esophagitis Will continue with PPI.    DVT Prophylaxis: On IV heparin    Code Status: Full code  Family Communication: Discussed with the patient  Disposition Plan: Further management per cardiology.    LOS: 1 day   [facility] Pager [phone] 07/01/2016, 1:35 PM  If 7PM-7AM, please contact night-coverage at [email or web address], password [facility]

## 2016-07-01 NOTE — Progress Notes (Signed)
  Echocardiogram 2D Echocardiogram has been performed.  Diamond Nickel 07/01/2016, 3:36 PM

## 2016-07-01 NOTE — Progress Notes (Signed)
[place] for heparin Indication: chest pain/ACS  Allergies  Allergen Reactions  . Oxycodone Itching    Patient Measurements: Height: 4\' 9"  (144.8 cm) Weight: 124 lb (56.2 kg) IBW/kg (Calculated) : 38.6 Heparin Dosing Weight: 50.8 kg  Vital Signs: Temp: 97.7 F (36.5 C) (10/31 2331) Temp Source: Oral (10/31 2331) BP: 85/38 (11/01 0246) Pulse Rate: 75 (11/01 0246)  Labs:  Recent Labs  06/29/16 1341 06/29/16 1623 06/29/16 2201 06/30/16 0200 07/01/16 0519  HGB 10.6*  --   --  9.7* 9.5*  HCT 32.2*  --   --  29.5* 28.9*  PLT 182  --   --  172 167  LABPROT  --   --   --  13.9  --   INR  --   --   --  1.06  --   HEPARINUNFRC  --   --   --  0.16* 0.43  CREATININE 2.88*  --   --  4.20*  --   TROPONINI  --  0.81* 0.75* 0.76*  --     Estimated Creatinine Clearance: 9.7 mL/min (by C-G formula based on SCr of 4.2 mg/dL (H)).    Assessment: 89 YOF started on heparin for ACS/STEMI. Pt not on any PTA anticoagulation. She has had SOB and CP over past two weeks.  Patient s/p cath 10/31 and is to be evaluated for bypass surgery. Heparin restarted 8h after sheath removal. Heparin level therapeutic (0.43) on gtt at 750 units/hr. No bleeding noted.  Goal of Therapy:  Heparin level 0.3-0.7 units/ml Monitor platelets by anticoagulation protocol: Yes   Plan:  Continue heparin at 750 units/hr Will f/u 6hr confirmatory heparin level  Sherlon Handing, PharmD, BCPS Clinical pharmacist, pager [phone] 07/01/2016 6:57 AM

## 2016-07-01 NOTE — Progress Notes (Signed)
[facility] Progress Note  Dialysis Orders: [place] MWF 4h F180 BFR 400 2K/2.25 Ca UF Profile 4 EDW 50kg L AVF Hectorol 45mcg q HD Venofer 50mg  IV q week (last 10/25) Mircera 50 mcg IV q 2 weeks (last 10/18) OP Labs: Hgb 10.2 Tsat 26%  K 3.8 Corr Ca 9.3 PTH 514   Assessment/Plan: 1. NSTEMI/CAD  - cards following - cardiac cath showing severe 2 vessel CAD - for PCI today  2. PNA - CXR with possible infiltrate in LLB - w/u per primary - on IV vanc/cefeipime blood cx ngtd  2. ESRD - cont MWF schedule  3. Anemia - Hgb 9.5 Aranesp 131mcg with HD today  4. Secondary hyperparathyroidism - Ca 8.0 cont Hectorol/binders  5. HTN/volume - BP low holding BP meds - not at EDW but no edema on exam - HD with UF goal 2.5L LVEDP at cath was high at 22 c/w vol excess. More UF as tol w HD today. Patient is symptomatic (SOB).  Home norvasc only BP med on hold here.  6. Nutrition - Alb 2.8 cont vitamins/protein supp   Carolyn Child PA-C [facility] Pager [phone] 07/01/2016,8:35 AM  LOS: 1 day   Pt seen, examined, agree w assess/plan as above with additions as indicated.  Carolyn Splinter MD [facility] pager [phone]    cell [phone] 07/01/2016, 9:12 AM     Subjective: Seen on HD using Kerr c/o "breathing hard" no chest pain   Objective Vitals:   07/01/16 0246 07/01/16 0635 07/01/16 0740 07/01/16 0744  BP: (!) 85/38 (!) 77/51 (!) 79/52 (!) 78/47  Pulse: 75 76 70 70  Resp: 15 15 16 18   Temp:  98.6 F (37 C) 98.6 F (37 C)   TempSrc:  Oral Oral   SpO2: 92% 100% 98%   Weight:   53.9 kg (118 lb 13.3 oz)   Height:       Physical Exam General: Frail appearing elderly AAF NAD Heart: RRR [id number] holoysystolic murmur Lungs: Breathing unlabored Bibaslar crackles  Abdomen: soft NT/ND Extremities: bilat BKA sites no edema  Dialysis Access: LUE AVF cannulated on HD   Additional Objective Labs: Basic Metabolic Panel:  Recent Labs Lab 06/29/16 1341  06/30/16 0200 07/01/16 0519  NA 137 135 130*  K 2.9* 3.4* 4.3  CL 97* 98* 95*  CO2 27 29 23   GLUCOSE 120* 125* 189*  BUN 8 18 29*  CREATININE 2.88* 4.20* 6.51*  CALCIUM 8.9 8.2* 8.0*   Liver Function Tests:  Recent Labs Lab 06/29/16 1341 06/30/16 0200 07/01/16 0519  AST 16 15 29   ALT 7* 7* 12*  ALKPHOS 72 64 54  BILITOT 0.5 0.4 0.7  PROT 7.1 6.1* 5.2*  ALBUMIN 3.6 3.0* 2.8*   No results for input(s): LIPASE, AMYLASE in the last 168 hours. CBC:  Recent Labs Lab 06/29/16 1341 06/30/16 0200 07/01/16 0519  WBC 4.6 5.2 6.5  NEUTROABS 2.2  --   --   HGB 10.6* 9.7* 9.5*  HCT 32.2* 29.5* 28.9*  MCV 88.7 88.1 88.4  PLT 182 172 167   Blood Culture    Component Value Date/Time   SDES BLOOD RIGHT ARM 06/29/2016 1350   SPECREQUEST BOTTLES DRAWN AEROBIC AND ANAEROBIC 5CC 06/29/2016 1350   CULT NO GROWTH < 24 HOURS 06/29/2016 1350   REPTSTATUS PENDING 06/29/2016 1350    Cardiac Enzymes:  Recent Labs Lab 06/29/16 1623 06/29/16 2201 06/30/16 0200  TROPONINI 0.81* 0.75* 0.76*   CBG:  Recent  Labs Lab 06/30/16 0749 06/30/16 1111 06/30/16 1208 06/30/16 1623 06/30/16 2134  GLUCAP 82 81 91 160* 114*   Iron Studies: No results for input(s): IRON, TIBC, TRANSFERRIN, FERRITIN in the last 72 hours. Lab Results  Component Value Date   INR 1.06 06/30/2016   INR 1.22 12/11/2015   INR 1.10 09/02/2014   Medications: . heparin 750 Units/hr (06/30/16 2143)   . aspirin EC  81 mg Oral Daily  . atorvastatin  80 mg Oral q1800  . ceFEPime (MAXIPIME) IV  2 g Intravenous Q M,W,F-HD  . darbepoetin (ARANESP) injection - DIALYSIS  100 mcg Intravenous Q Wed-HD  . doxercalciferol  2 mcg Intravenous Q M,W,F-HD  . famotidine  40 mg Oral BID  . feeding supplement (PRO-STAT SUGAR FREE 64)  30 mL Oral BID  . gabapentin  100 mg Oral BID  . glipiZIDE  10 mg Oral Q breakfast  . insulin aspart  0-9 Units Subcutaneous TID WC  . multivitamin  1 tablet Oral QHS  . sevelamer carbonate   1,600 mg Oral TID WC  . sodium chloride flush  3 mL Intravenous Q12H  . sodium chloride flush  3 mL Intravenous Q12H  . sodium chloride flush  3 mL Intravenous Q12H  . sodium chloride flush  3 mL Intravenous Q12H  . vancomycin  500 mg Intravenous Q M,W,F-HD

## 2016-07-02 ENCOUNTER — Other Ambulatory Visit (HOSPITAL_COMMUNITY): Payer: BC Managed Care – PPO

## 2016-07-02 ENCOUNTER — Encounter (HOSPITAL_COMMUNITY)
Admission: EM | Disposition: A | Discharge status: Home / Self Care | Payer: Self-pay | Source: Home / Self Care | Attending: Internal Medicine

## 2016-07-02 ENCOUNTER — Encounter (HOSPITAL_COMMUNITY): Payer: Self-pay | Admitting: General Practice

## 2016-07-02 ENCOUNTER — Inpatient Hospital Stay (HOSPITAL_COMMUNITY): Payer: Medicare Other

## 2016-07-02 DIAGNOSIS — I2510 Atherosclerotic heart disease of native coronary artery without angina pectoris: Secondary | ICD-10-CM

## 2016-07-02 DIAGNOSIS — I351 Nonrheumatic aortic (valve) insufficiency: Secondary | ICD-10-CM

## 2016-07-02 HISTORY — PX: CARDIAC CATHETERIZATION: SHX172

## 2016-07-02 HISTORY — PX: CORONARY ANGIOPLASTY WITH STENT PLACEMENT: SHX49

## 2016-07-02 LAB — BASIC METABOLIC PANEL
Anion gap: 11 (ref 5–15)
BUN: 15 mg/dL (ref 6–20)
CO2: 26 mmol/L (ref 22–32)
Calcium: 8.1 mg/dL — ABNORMAL LOW (ref 8.9–10.3)
Chloride: 96 mmol/L — ABNORMAL LOW (ref 101–111)
Creatinine, Ser: 3.43 mg/dL — ABNORMAL HIGH (ref 0.44–1.00)
GFR calc Af Amer: 15 mL/min — ABNORMAL LOW (ref >60)
GFR calc non Af Amer: 13 mL/min — ABNORMAL LOW (ref >60)
Glucose, Bld: 82 mg/dL (ref 65–99)
Potassium: 4.5 mmol/L (ref 3.5–5.1)
Sodium: 133 mmol/L — ABNORMAL LOW (ref 135–145)

## 2016-07-02 LAB — POCT ACTIVATED CLOTTING TIME
Activated Clotting Time: 373 seconds
Activated Clotting Time: 169 seconds

## 2016-07-02 LAB — CBC
HCT: 26 % — ABNORMAL LOW (ref 36.0–46.0)
Hemoglobin: 8.3 g/dL — ABNORMAL LOW (ref 12.0–15.0)
MCH: 28.7 pg (ref 26.0–34.0)
MCHC: 31.9 g/dL (ref 30.0–36.0)
MCV: 90 fL (ref 78.0–100.0)
Platelets: 143 10*3/uL — ABNORMAL LOW (ref 150–400)
RBC: 2.89 MIL/uL — ABNORMAL LOW (ref 3.87–5.11)
RDW: 17.6 % — ABNORMAL HIGH (ref 11.5–15.5)
WBC: 6.2 10*3/uL (ref 4.0–10.5)

## 2016-07-02 LAB — HEPARIN LEVEL (UNFRACTIONATED): Heparin Unfractionated: 0.58 IU/mL (ref 0.30–0.70)

## 2016-07-02 LAB — GLUCOSE, CAPILLARY
Glucose-Capillary: 119 mg/dL — ABNORMAL HIGH (ref 65–99)
Glucose-Capillary: 114 mg/dL — ABNORMAL HIGH (ref 65–99)
Glucose-Capillary: 89 mg/dL (ref 65–99)
Glucose-Capillary: 194 mg/dL — ABNORMAL HIGH (ref 65–99)

## 2016-07-02 SURGERY — CORONARY STENT INTERVENTION

## 2016-07-02 MED ORDER — MIDAZOLAM HCL 2 MG/2ML IJ SOLN
INTRAMUSCULAR | Status: AC
  Filled 2016-07-02: qty 2

## 2016-07-02 MED ORDER — HEART ATTACK BOUNCING BOOK
Freq: Once | Status: AC
  Administered 2016-07-02: 20:00:00
  Filled 2016-07-02: qty 1

## 2016-07-02 MED ORDER — FENTANYL CITRATE (PF) 100 MCG/2ML IJ SOLN
INTRAMUSCULAR | Status: DC | PRN
  Administered 2016-07-02: 25 ug via INTRAVENOUS

## 2016-07-02 MED ORDER — NEPRO/CARBSTEADY PO LIQD
237.0000 mL | Freq: Two times a day (BID) | ORAL | Status: DC
  Administered 2016-07-03 – 2016-07-04 (×4): 237 mL via ORAL
  Filled 2016-07-02 (×6): qty 237

## 2016-07-02 MED ORDER — SODIUM CHLORIDE 0.9% FLUSH: 3.0000 mL | INTRAVENOUS | Status: DC | PRN

## 2016-07-02 MED ORDER — IOPAMIDOL (ISOVUE-370) INJECTION 76%
INTRAVENOUS | Status: DC | PRN
  Administered 2016-07-02: 115 mL via INTRA_ARTERIAL

## 2016-07-02 MED ORDER — SODIUM CHLORIDE 0.9% FLUSH
3.0000 mL | Freq: Two times a day (BID) | INTRAVENOUS | Status: DC
  Administered 2016-07-03 – 2016-07-04 (×3): 3 mL via INTRAVENOUS

## 2016-07-02 MED ORDER — ANGIOPLASTY BOOK
Freq: Once | Status: AC
  Administered 2016-07-02: 20:00:00
  Filled 2016-07-02: qty 1

## 2016-07-02 MED ORDER — SODIUM CHLORIDE 0.9 % IV SOLN: 250.0000 mL | INTRAVENOUS | Status: DC | PRN

## 2016-07-02 MED ORDER — SODIUM CHLORIDE 0.9% FLUSH
3.0000 mL | Freq: Two times a day (BID) | INTRAVENOUS | Status: DC
  Administered 2016-07-02: 3 mL via INTRAVENOUS

## 2016-07-02 MED ORDER — BIVALIRUDIN 250 MG IV SOLR
INTRAVENOUS | Status: AC
  Filled 2016-07-02: qty 250

## 2016-07-02 MED ORDER — LIDOCAINE HCL (PF) 1 % IJ SOLN
INTRAMUSCULAR | Status: AC
  Filled 2016-07-02: qty 30

## 2016-07-02 MED ORDER — SODIUM CHLORIDE 0.9 % IV SOLN
INTRAVENOUS | Status: DC | PRN
  Administered 2016-07-02: 0.25 mg/kg/h via INTRAVENOUS

## 2016-07-02 MED ORDER — CLOPIDOGREL BISULFATE 75 MG PO TABS
75.0000 mg | ORAL_TABLET | Freq: Every day | ORAL | Status: DC
  Administered 2016-07-03 – 2016-07-04 (×2): 75 mg via ORAL
  Filled 2016-07-02 (×3): qty 1

## 2016-07-02 MED ORDER — SODIUM CHLORIDE 0.9 % IV SOLN: INTRAVENOUS | Status: DC

## 2016-07-02 MED ORDER — ASPIRIN 81 MG PO CHEW: 81.0000 mg | CHEWABLE_TABLET | ORAL | Status: DC

## 2016-07-02 MED ORDER — HEPARIN (PORCINE) IN NACL 2-0.9 UNIT/ML-% IJ SOLN
INTRAMUSCULAR | Status: AC
  Filled 2016-07-02: qty 1000

## 2016-07-02 MED ORDER — HEPARIN (PORCINE) IN NACL 2-0.9 UNIT/ML-% IJ SOLN
INTRAMUSCULAR | Status: DC | PRN
  Administered 2016-07-02: 1000 mL

## 2016-07-02 MED ORDER — HEPARIN SODIUM (PORCINE) 5000 UNIT/ML IJ SOLN
5000.0000 [IU] | Freq: Three times a day (TID) | INTRAMUSCULAR | Status: DC
  Administered 2016-07-03 – 2016-07-04 (×4): 5000 [IU] via SUBCUTANEOUS
  Filled 2016-07-02 (×3): qty 1

## 2016-07-02 MED ORDER — LIDOCAINE HCL (PF) 1 % IJ SOLN
INTRAMUSCULAR | Status: DC | PRN
  Administered 2016-07-02: 15 mL via INTRADERMAL

## 2016-07-02 MED ORDER — FAMOTIDINE 20 MG PO TABS
20.0000 mg | ORAL_TABLET | Freq: Every day | ORAL | Status: DC
  Administered 2016-07-03 – 2016-07-04 (×2): 20 mg via ORAL
  Filled 2016-07-02 (×2): qty 1

## 2016-07-02 MED ORDER — BIVALIRUDIN BOLUS VIA INFUSION - CUPID
INTRAVENOUS | Status: DC | PRN
  Administered 2016-07-02: 41.325 mg via INTRAVENOUS

## 2016-07-02 MED ORDER — CLOPIDOGREL BISULFATE 300 MG PO TABS
600.0000 mg | ORAL_TABLET | Freq: Once | ORAL | Status: AC
  Administered 2016-07-02: 600 mg via ORAL
  Filled 2016-07-02: qty 8

## 2016-07-02 MED ORDER — MIDAZOLAM HCL 2 MG/2ML IJ SOLN
INTRAMUSCULAR | Status: DC | PRN
  Administered 2016-07-02: 0.5 mg via INTRAVENOUS

## 2016-07-02 MED ORDER — IOPAMIDOL (ISOVUE-370) INJECTION 76%
INTRAVENOUS | Status: AC
  Filled 2016-07-02: qty 125

## 2016-07-02 MED ORDER — FENTANYL CITRATE (PF) 100 MCG/2ML IJ SOLN
INTRAMUSCULAR | Status: AC
  Filled 2016-07-02: qty 2

## 2016-07-02 MED ORDER — SODIUM CHLORIDE 0.9% FLUSH
3.0000 mL | Freq: Two times a day (BID) | INTRAVENOUS | Status: DC
  Administered 2016-07-02 – 2016-07-04 (×4): 3 mL via INTRAVENOUS

## 2016-07-02 SURGICAL SUPPLY — 18 items
BALLN MOZEC 1.5X12 (BALLOONS) ×2
BALLN MOZEC 2.25X14 (BALLOONS) ×2
BALLN ~~LOC~~ MOZEC 2.75X10 (BALLOONS) ×2
BALLOON MOZEC 1.5X12 (BALLOONS) ×1 IMPLANT
BALLOON MOZEC 2.25X14 (BALLOONS) ×1 IMPLANT
BALLOON ~~LOC~~ MOZEC 2.75X10 (BALLOONS) ×1 IMPLANT
CATH VISTA GUIDE 6FR XB3.5 (CATHETERS) ×2 IMPLANT
GUIDEWIRE 3MM J TIP .035 145 (WIRE) ×2 IMPLANT
GUIDEWIRE ANGLED .035X150CM (WIRE) ×2 IMPLANT
KIT ENCORE 26 ADVANTAGE (KITS) ×2 IMPLANT
KIT HEART LEFT (KITS) ×2 IMPLANT
PACK CARDIAC CATHETERIZATION (CUSTOM PROCEDURE TRAY) ×2 IMPLANT
SET INTRODUCER MICROPUNCT 5F (INTRODUCER) ×2 IMPLANT
SHEATH PINNACLE 6F 10CM (SHEATH) ×2 IMPLANT
STENT SYNERGY DES 2.5X16 (Permanent Stent) ×2 IMPLANT
TRANSDUCER W/STOPCOCK (MISCELLANEOUS) ×2 IMPLANT
TUBING CIL FLEX 10 FLL-RA (TUBING) ×2 IMPLANT
WIRE ASAHI FIELDER XT 190CM (WIRE) ×2 IMPLANT

## 2016-07-02 NOTE — Progress Notes (Signed)
Site area: right groin  Site Prior to Removal:  Level 0  Pressure Applied For 20 MINUTES    Minutes Beginning at 1815  Manual:   Yes.    Patient Status During Pull:  stable  Post Pull Groin Site:  Level 0  Post Pull Instructions Given:  Yes.    Post Pull Pulses Present:  Yes.    Dressing Applied:  Yes.    Comments:  Checked with no change in assessment, dressing remains dry and intact

## 2016-07-02 NOTE — Interval H&P Note (Signed)
History and Physical Interval Note:  07/02/2016 2:04 PM  Carolyn Valenzuela  has presented today for cardiac catheterization, with the diagnosis of severe coronary artery disease. The various methods of treatment have been discussed with the patient and family. After consideration of risks, benefits and other options for treatment, the patient has consented to  Procedure(s): Coronary Stent Intervention (N/A) as a surgical intervention .  The patient's history has been reviewed, patient examined, no change in status, stable for surgery.  I have reviewed the patient's chart and labs.  Questions were answered to the patient's satisfaction.  Patient was advised of increased risk for complication given her multiple comorbidities, including multivessel CAD, cardiomyopathy with moderately reduced LV function, PVD, and ESRD.  She has agreed to proceed.  Cath Lab Visit (complete for each Cath Lab visit)  Clinical Evaluation Leading to the Procedure:   ACS: Yes.  (NSTEMI)  Non-ACS:   N/A  Darrien Belter

## 2016-07-02 NOTE — Progress Notes (Signed)
Subjective:  Denies CP or dyspnea.   Objective:  Vitals:   07/01/16 1239 07/01/16 1718 07/01/16 2103 07/02/16 0607  BP: (!) 86/46 (!) 90/56 (!) 90/47 (!) 85/45  Pulse: 73 81 75 72  Resp:  17 16 16   Temp: 98.2 F (36.8 C) 99.3 F (37.4 C) 98.8 F (37.1 C) 99.2 F (37.3 C)  TempSrc: Oral Oral Oral Oral  SpO2: 100% 98% 97% 100%  Weight:   121 lb 7.6 oz (55.1 kg)   Height:        Intake/Output from previous day:  Intake/Output Summary (Last 24 hours) at 07/02/16 0912 Last data filed at 07/02/16 6269  Gross per 24 hour  Intake           452.13 ml  Output             2000 ml  Net         -1547.87 ml    Physical Exam: Physical exam: Well-developed chronically ill appearing in no acute distress.  Skin is warm and dry.  HEENT is normal.  Neck is supple.  Chest is clear to auscultation with normal expansion.  Cardiovascular exam is regular rate and rhythm. 2/6 systolic murmur Abdominal exam nontender or distended. No masses palpated. Extremities s/p bilateral BKA neuro grossly intact    Lab Results: Basic Metabolic Panel:  Recent Labs  06/29/16 1631  07/01/16 0519 07/02/16 0453  NA  --   < > 130* 133*  K  --   < > 4.3 4.5  CL  --   < > 95* 96*  CO2  --   < > 23 26  GLUCOSE  --   < > 189* 82  BUN  --   < > 29* 15  CREATININE  --   < > 6.51* 3.43*  CALCIUM  --   < > 8.0* 8.1*  MG 1.9  --   --   --   < > = values in this interval not displayed. CBC:  Recent Labs  06/29/16 1341  07/01/16 0519 07/02/16 0453  WBC 4.6  < > 6.5 6.2  NEUTROABS 2.2  --   --   --   HGB 10.6*  < > 9.5* 8.3*  HCT 32.2*  < > 28.9* 26.0*  MCV 88.7  < > 88.4 90.0  PLT 182  < > 167 143*  < > = values in this interval not displayed. Cardiac Enzymes:  Recent Labs  06/29/16 1623 06/29/16 2201 06/30/16 0200  TROPONINI 0.81* 0.75* 0.76*     Assessment/Plan:  65 year old female with end-stage renal disease, peripheral vascular disease, diabetes mellitus admitted with  exertional dyspnea and atypical chest pain. Troponin abnormal consistent with non-ST elevation myocardial infarction. Cardiac cath with 2 vessel CAD. Thought not to be a candidate for CABG per Dr Servando Snare.   1 non-ST elevation myocardial infarction-Not a candidate for CABG. Discussed with Dr Angelena Form; plan PCI of Lcx today. The risks and benefits including myocardial infarction, CVA and death discussed and she agrees to proceed. Continue aspirin, plavix, heparin and statin.   2 hyperlipidemia-continue Lipitor 80 mg daily. Check lipids and liver in 4 weeks.   3 hypertension-BP now borderline on no meds  4 End-stage renal disease-dialysis per nephrology.   5 question pneumonia-clinically does not appear to have pneumonia. Management per primary care.   6 Valvular heart disease-severe AI and moderate to severe MR on echo; difficult situation as felt not to a candidate for CABG;  will need FU echos following revascularization.  Kirk Ruths 07/02/2016, 9:12 AM

## 2016-07-02 NOTE — Progress Notes (Signed)
[place] for heparin Indication: chest pain/ACS  Allergies  Allergen Reactions  . Oxycodone Itching    Patient Measurements: Height: 4\' 9"  (144.8 cm) Weight: 121 lb 7.6 oz (55.1 kg) IBW/kg (Calculated) : 38.6 Heparin Dosing Weight: 50.8 kg  Vital Signs: Temp: 98.8 F (37.1 C) (11/01 2103) Temp Source: Oral (11/01 2103) BP: 90/47 (11/01 2103) Pulse Rate: 75 (11/01 2103)  Labs:  Recent Labs  06/29/16 1341 06/29/16 1623 06/29/16 2201  06/30/16 0200 07/01/16 0519 07/01/16 1556 07/02/16 0028  HGB 10.6*  --   --   --  9.7* 9.5*  --   --   HCT 32.2*  --   --   --  29.5* 28.9*  --   --   PLT 182  --   --   --  172 167  --   --   LABPROT  --   --   --   --  13.9  --   --   --   INR  --   --   --   --  1.06  --   --   --   HEPARINUNFRC  --   --   --   < > 0.16* 0.43 0.74* 0.58  CREATININE 2.88*  --   --   --  4.20* 6.51*  --   --   TROPONINI  --  0.81* 0.75*  --  0.76*  --   --   --   < > = values in this interval not displayed.  Estimated Creatinine Clearance: 6.2 mL/min (by C-G formula based on SCr of 6.51 mg/dL (H)).    Assessment: 16 YOF with bilateral bka's started on heparin for ACS/STEMI. Pt not on any PTA anticoagulation. She has had SOB and CP over past two weeks.  Patient s/p cath 10/31 which showed 2v CAD - not candidate for CABG, plan for PCI of Lcx. Heparin level therapeutic (0.58) on gtt at 700 units/hr. No bleeding noted.  Goal of Therapy:  Heparin level 0.3-0.7 units/ml Monitor platelets by anticoagulation protocol: Yes   Plan:  Continue heparin at 700 units/hr F/u daily heparin level and CBC  Sherlon Handing, PharmD, BCPS Clinical pharmacist, pager [phone] 07/02/2016 1:42 AM

## 2016-07-02 NOTE — Progress Notes (Signed)
[facility] Progress Note  Dialysis Orders: [place] MWF 4h F180 BFR 400 2K/2.25 Ca UF Profile 4 EDW 50kg L AVF Hectorol 9mcg q HD Venofer 50mg  IV q week (last 10/25) Mircera 50 mcg IV q 2 weeks (last 10/18) OP Labs: Hgb 10.2 Tsat 26%  K 3.8 Corr Ca 9.3 PTH 514   CXR 10/30 - no edema, poss retrocard infiltrate   Assessment/Plan: 1. NSTEMI/CAD  - cards following - cardiac cath showing severe 2 vessel CAD - not a candidate for CABG. Going for PCI today  2. PNA - CXR with possible infiltrate in LLB - w/u per primary - on IV vanc/cefeipime blood cx ngtd  3. ESRD - cont MWF schedule  4. Anemia - Hgb 8.3 Aranesp 164mcg 11/1/ holding Fe with ?infection restart if blood cultures neg 5. Secondary hyperparathyroidism - Ca 8.1 cont Hectorol/binders  6. HTN- bp's low, norvasc on hold  7. Volume -Not at EDW post HD weight 51.9kg net UF 2L with dyspnea today  - plan HD tomorrow UF goal 3.5L  8. Valvular heart disease - LVEF 40-45% severe aortic and mod-severe mitral regurgitation by echo   9. Nutrition - Alb 2.8 cont vitamins/protein supp 10. Hypotension - low BP's are worrisome, pt w significant AI/ MVR by echo, SOB today, will check CXR; last film 3 d ago was clear of edema    Lynnda Child PA-C [place] Pager [phone] 07/02/2016,10:48 AM  LOS: 2 days   Pt seen, examined, agree w assess/plan as above with additions as indicated.  Kelly Splinter MD [employer] pager [phone]    cell [phone] 07/02/2016, 1:26 PM     Subjective:  For PCI today. Breathing unchanged still SOB after HD yesterday    Objective Vitals:   07/01/16 1718 07/01/16 2103 07/02/16 0607 07/02/16 0957  BP: (!) 90/56 (!) 90/47 (!) 85/45 (!) 84/48  Pulse: 81 75 72 73  Resp: 17 16 16 16   Temp: 99.3 F (37.4 C) 98.8 F (37.1 C) 99.2 F (37.3 C) 98.9 F (37.2 C)  TempSrc: Oral Oral Oral Oral  SpO2: 98% 97% 100% 100%  Weight:  55.1 kg (121 lb 7.6 oz)    Height:        Physical Exam General: Frail appearing elderly AAF NAD Heart: RRR [id number] holoysystolic murmur Lungs: Breathing unlabored Bibaslar crackles  Abdomen: soft NT/ND Extremities: bilat BKA sites no edema  Dialysis Access: LUE AVF +bruit  Additional Objective Labs: Basic Metabolic Panel:  Recent Labs Lab 06/30/16 0200 07/01/16 0519 07/02/16 0453  NA 135 130* 133*  K 3.4* 4.3 4.5  CL 98* 95* 96*  CO2 29 23 26   GLUCOSE 125* 189* 82  BUN 18 29* 15  CREATININE 4.20* 6.51* 3.43*  CALCIUM 8.2* 8.0* 8.1*   Liver Function Tests:  Recent Labs Lab 06/29/16 1341 06/30/16 0200 07/01/16 0519  AST 16 15 29   ALT 7* 7* 12*  ALKPHOS 72 64 54  BILITOT 0.5 0.4 0.7  PROT 7.1 6.1* 5.2*  ALBUMIN 3.6 3.0* 2.8*   No results for input(s): LIPASE, AMYLASE in the last 168 hours. CBC:  Recent Labs Lab 06/29/16 1341 06/30/16 0200 07/01/16 0519 07/02/16 0453  WBC 4.6 5.2 6.5 6.2  NEUTROABS 2.2  --   --   --   HGB 10.6* 9.7* 9.5* 8.3*  HCT 32.2* 29.5* 28.9* 26.0*  MCV 88.7 88.1 88.4 90.0  PLT 182 172 167 143*   Blood Culture    Component Value Date/Time  SDES BLOOD RIGHT ARM 06/29/2016 1350   SPECREQUEST BOTTLES DRAWN AEROBIC AND ANAEROBIC 5CC 06/29/2016 1350   CULT NO GROWTH 3 DAYS 06/29/2016 1350   REPTSTATUS PENDING 06/29/2016 1350    Cardiac Enzymes:  Recent Labs Lab 06/29/16 1623 06/29/16 2201 06/30/16 0200  TROPONINI 0.81* 0.75* 0.76*   CBG:  Recent Labs Lab 07/01/16 1207 07/01/16 1250 07/01/16 1653 07/01/16 2102 07/02/16 0735  GLUCAP 111* 122* 214* 288* 119*   Iron Studies: No results for input(s): IRON, TIBC, TRANSFERRIN, FERRITIN in the last 72 hours. Lab Results  Component Value Date   INR 1.06 06/30/2016   INR 1.22 12/11/2015   INR 1.10 09/02/2014   Medications: . sodium chloride    . heparin 700 Units/hr (07/01/16 1851)   . aspirin EC  81 mg Oral Daily  . atorvastatin  80 mg Oral q1800  . clopidogrel  600 mg Oral Once  . clopidogrel  75 mg  Oral Daily  . darbepoetin (ARANESP) injection - DIALYSIS  100 mcg Intravenous Q Wed-HD  . doxercalciferol  2 mcg Intravenous Q M,W,F-HD  . famotidine  40 mg Oral BID  . feeding supplement (PRO-STAT SUGAR FREE 64)  30 mL Oral BID  . gabapentin  100 mg Oral BID  . insulin aspart  0-9 Units Subcutaneous TID WC  . [START ON 07/03/2016] levofloxacin (LEVAQUIN) IV  500 mg Intravenous Q48H  . multivitamin  1 tablet Oral QHS  . sevelamer carbonate  1,600 mg Oral TID WC  . sodium chloride flush  3 mL Intravenous Q12H  . sodium chloride flush  3 mL Intravenous Q12H

## 2016-07-02 NOTE — H&P (View-Only) (Signed)
Subjective:  Denies CP or dyspnea.   Objective:  Vitals:   07/01/16 1239 07/01/16 1718 07/01/16 2103 07/02/16 0607  BP: (!) 86/46 (!) 90/56 (!) 90/47 (!) 85/45  Pulse: 73 81 75 72  Resp:  17 16 16   Temp: 98.2 F (36.8 C) 99.3 F (37.4 C) 98.8 F (37.1 C) 99.2 F (37.3 C)  TempSrc: Oral Oral Oral Oral  SpO2: 100% 98% 97% 100%  Weight:   121 lb 7.6 oz (55.1 kg)   Height:        Intake/Output from previous day:  Intake/Output Summary (Last 24 hours) at 07/02/16 0912 Last data filed at 07/02/16 4268  Gross per 24 hour  Intake           452.13 ml  Output             2000 ml  Net         -1547.87 ml    Physical Exam: Physical exam: Well-developed chronically ill appearing in no acute distress.  Skin is warm and dry.  HEENT is normal.  Neck is supple.  Chest is clear to auscultation with normal expansion.  Cardiovascular exam is regular rate and rhythm. 2/6 systolic murmur Abdominal exam nontender or distended. No masses palpated. Extremities s/p bilateral BKA neuro grossly intact    Lab Results: Basic Metabolic Panel:  Recent Labs  06/29/16 1631  07/01/16 0519 07/02/16 0453  NA  --   < > 130* 133*  K  --   < > 4.3 4.5  CL  --   < > 95* 96*  CO2  --   < > 23 26  GLUCOSE  --   < > 189* 82  BUN  --   < > 29* 15  CREATININE  --   < > 6.51* 3.43*  CALCIUM  --   < > 8.0* 8.1*  MG 1.9  --   --   --   < > = values in this interval not displayed. CBC:  Recent Labs  06/29/16 1341  07/01/16 0519 07/02/16 0453  WBC 4.6  < > 6.5 6.2  NEUTROABS 2.2  --   --   --   HGB 10.6*  < > 9.5* 8.3*  HCT 32.2*  < > 28.9* 26.0*  MCV 88.7  < > 88.4 90.0  PLT 182  < > 167 143*  < > = values in this interval not displayed. Cardiac Enzymes:  Recent Labs  06/29/16 1623 06/29/16 2201 06/30/16 0200  TROPONINI 0.81* 0.75* 0.76*     Assessment/Plan:  65 year old female with end-stage renal disease, peripheral vascular disease, diabetes mellitus admitted with  exertional dyspnea and atypical chest pain. Troponin abnormal consistent with non-ST elevation myocardial infarction. Cardiac cath with 2 vessel CAD. Thought not to be a candidate for CABG per Dr Servando Snare.   1 non-ST elevation myocardial infarction-Not a candidate for CABG. Discussed with Dr Angelena Form; plan PCI of Lcx today. The risks and benefits including myocardial infarction, CVA and death discussed and she agrees to proceed. Continue aspirin, plavix, heparin and statin.   2 hyperlipidemia-continue Lipitor 80 mg daily. Check lipids and liver in 4 weeks.   3 hypertension-BP now borderline on no meds  4 End-stage renal disease-dialysis per nephrology.   5 question pneumonia-clinically does not appear to have pneumonia. Management per primary care.   6 Valvular heart disease-severe AI and moderate to severe MR on echo; difficult situation as felt not to a candidate for CABG;  will need FU echos following revascularization.  Kirk Ruths 07/02/2016, 9:12 AM

## 2016-07-02 NOTE — Progress Notes (Signed)
[facility] PROGRESS NOTE  Carolyn Valenzuela EHU:[id number] DOB: 09-20-1950 DOA: 06/29/2016  PCP: Cathlean Cower, MD  Brief History/Interval Summary: 65 year old African-American female with a past medical history of end-stage renal disease on hemodialysis, diabetes, hypertension, peripheral vascular disease, presented to the emergency department after her dialysis due to complaints of worsening shortness of breath. She also mention chest pain. EKG showed nonspecific ST and T wave changes. Troponin was mildly elevated. Patient was seen by cardiology and underwent cardiac catheterization which showed severe diffuse CAD. Plan is for PCI to the circumflex.  Reason for Visit: Coronary artery disease  Consultants: Nephrology. Cardiology. Cardiothoracic surgery.  Procedures: Hemodialysis. Cardiac catheterization  Transthoracic echocardiogram Study Conclusions  - Left ventricle: The cavity size was normal. There was mild focal   basal hypertrophy of the septum. Systolic function was mildly to   moderately reduced. The estimated ejection fraction was in the   range of 40% to 45%. Diffuse hypokinesis. There is hypokinesis of   the lateral and inferolateral myocardium. - Aortic valve: There was severe regurgitation. - Mitral valve: Calcified annulus. Mild prolapse, involving the   anterior leaflet. There was moderate to severe regurgitation   directed posteriorly. - Pulmonary arteries: Systolic pressure was moderately increased.   PA peak pressure: 51 mm Hg (S).  Antibiotics: Vancomycin and cefepime changed to Levaquin on 11/1  Subjective/Interval History: Patient denies any chest pain. States that her shortness of breath has improved. Denies any other complaints at this time.   ROS: Denies any nausea or vomiting.  Objective:  Vital Signs  Vitals:   07/01/16 1239 07/01/16 1718 07/01/16 2103 07/02/16 0607  BP: (!) 86/46 (!) 90/56 (!) 90/47 (!) 85/45  Pulse: 73 81 75 72  Resp:   17 16 16   Temp: 98.2 F (36.8 C) 99.3 F (37.4 C) 98.8 F (37.1 C) 99.2 F (37.3 C)  TempSrc: Oral Oral Oral Oral  SpO2: 100% 98% 97% 100%  Weight:   55.1 kg (121 lb 7.6 oz)   Height:        Intake/Output Summary (Last 24 hours) at 07/02/16 0838 Last data filed at 07/02/16 7858  Gross per 24 hour  Intake           452.13 ml  Output             2000 ml  Net         -1547.87 ml   Filed Weights   07/01/16 0740 07/01/16 1145 07/01/16 2103  Weight: 53.9 kg (118 lb 13.3 oz) 51.9 kg (114 lb 6.7 oz) 55.1 kg (121 lb 7.6 oz)    General appearance: alert, cooperative, appears stated age and no distress Resp: clear to auscultation bilaterally Cardio: regular rate and rhythm, S1, S2 normal, no murmur, click, rub or gallop GI: soft, non-tender; bowel sounds normal; no masses,  no organomegaly Extremities: extremities normal, atraumatic, no cyanosis or edema Neurologic: Awake and alert. Oriented 3. No focal neurological deficits.  Lab Results:  Data Reviewed: I have personally reviewed following labs and imaging studies  CBC:  Recent Labs Lab 06/29/16 1341 06/30/16 0200 07/01/16 0519 07/02/16 0453  WBC 4.6 5.2 6.5 6.2  NEUTROABS 2.2  --   --   --   HGB 10.6* 9.7* 9.5* 8.3*  HCT 32.2* 29.5* 28.9* 26.0*  MCV 88.7 88.1 88.4 90.0  PLT 182 172 167 143*    Basic Metabolic Panel:  Recent Labs Lab 06/29/16 1341 06/29/16 1631 06/30/16 0200 07/01/16 0519 07/02/16 0453  NA  137  --  135 130* 133*  K 2.9*  --  3.4* 4.3 4.5  CL 97*  --  98* 95* 96*  CO2 27  --  29 23 26   GLUCOSE 120*  --  125* 189* 82  BUN 8  --  18 29* 15  CREATININE 2.88*  --  4.20* 6.51* 3.43*  CALCIUM 8.9  --  8.2* 8.0* 8.1*  MG  --  1.9  --   --   --     GFR: Estimated Creatinine Clearance: 11.8 mL/min (by C-G formula based on SCr of 3.43 mg/dL (H)).  Liver Function Tests:  Recent Labs Lab 06/29/16 1341 06/30/16 0200 07/01/16 0519  AST 16 15 29   ALT 7* 7* 12*  ALKPHOS 72 64 54  BILITOT 0.5  0.4 0.7  PROT 7.1 6.1* 5.2*  ALBUMIN 3.6 3.0* 2.8*    Coagulation Profile:  Recent Labs Lab 06/30/16 0200  INR 1.06    Cardiac Enzymes:  Recent Labs Lab 06/29/16 1623 06/29/16 2201 06/30/16 0200  TROPONINI 0.81* 0.75* 0.76*    CBG:  Recent Labs Lab 07/01/16 1207 07/01/16 1250 07/01/16 1653 07/01/16 2102 07/02/16 0735  GLUCAP 111* 122* 214* 288* 119*     Recent Results (from the past 240 hour(s))  Culture, blood (routine x 2)     Status: None (Preliminary result)   Collection Time: 06/29/16  1:45 PM  Result Value Ref Range Status   Specimen Description BLOOD RIGHT HAND  Final   Special Requests IN PEDIATRIC BOTTLE 4CC  Final   Culture NO GROWTH 3 DAYS  Final   Report Status PENDING  Incomplete  Culture, blood (routine x 2)     Status: None (Preliminary result)   Collection Time: 06/29/16  1:50 PM  Result Value Ref Range Status   Specimen Description BLOOD RIGHT ARM  Final   Special Requests BOTTLES DRAWN AEROBIC AND ANAEROBIC 5CC  Final   Culture NO GROWTH 3 DAYS  Final   Report Status PENDING  Incomplete  Surgical PCR screen     Status: None   Collection Time: 06/30/16  5:31 AM  Result Value Ref Range Status   MRSA, PCR NEGATIVE NEGATIVE Final   Staphylococcus aureus NEGATIVE NEGATIVE Final    Comment:        The Xpert SA Assay (FDA approved for NASAL specimens in patients over 56 years of age), is one component of a comprehensive surveillance program.  Test performance has been validated by [facility] for patients greater than or equal to 35 year old. It is not intended to diagnose infection nor to guide or monitor treatment.       Radiology Studies: No results found.   Medications:  Scheduled: . aspirin EC  81 mg Oral Daily  . atorvastatin  80 mg Oral q1800  . clopidogrel  600 mg Oral Once  . darbepoetin (ARANESP) injection - DIALYSIS  100 mcg Intravenous Q Wed-HD  . doxercalciferol  2 mcg Intravenous Q M,W,F-HD  . famotidine  40  mg Oral BID  . feeding supplement (PRO-STAT SUGAR FREE 64)  30 mL Oral BID  . gabapentin  100 mg Oral BID  . insulin aspart  0-9 Units Subcutaneous TID WC  . [START ON 07/03/2016] levofloxacin (LEVAQUIN) IV  500 mg Intravenous Q48H  . multivitamin  1 tablet Oral QHS  . sevelamer carbonate  1,600 mg Oral TID WC  . sodium chloride flush  3 mL Intravenous Q12H  . sodium chloride flush  3 mL Intravenous Q12H   Continuous: . sodium chloride    . heparin 700 Units/hr (07/01/16 1851)   ERD:EYCXKG chloride, sodium chloride, sodium chloride, sodium chloride, acetaminophen **OR** acetaminophen, ondansetron **OR** ondansetron (ZOFRAN) IV, sodium chloride flush, sodium chloride flush  Assessment/Plan:  Active Problems:   Hyperlipidemia   Essential hypertension   S/P BKA (below knee amputation) ([place])   Type 2 diabetes mellitus with ESRD (end-stage renal disease) ([place])   Anemia of renal disease   ESRD needing dialysis ([place])   HCAP (healthcare-associated pneumonia)   NSTEMI (non-ST elevated myocardial infarction) ([place])   Elevated troponin    Healthcare associated pneumonia Patient was started on vancomycin and cefepime based on x-ray findings. Culture have been negative. She is afebrile. WBCs normal. Vision was changed over to Levaquin which will be continued for now.   Non-ST elevation myocardial infarction Patient presents with progressive dyspnea on exertion and atypical chest pain. Concerning for unstable angina given multiple cardiovascular risk factors. Cardiology was consulted, s/p cath showed severe diffuse CAD. She is a diabetic on hemodialysis and the favorable approach to revascularization would be with bypass surgery. Currently on heparin gtt. seen by cardiothoracic surgery. Not a candidate for bypass. Plan is to try and intervene on the circumflex lesion. PCI is pending. Continue aspirin. Defer management to cardiology at this time. Echocardiogram shows valvular heart disease with  severe aortic regurgitation as well as moderate to severe mitral regurgitation. Etiology is following.  Hyperlipidemia Continue statin  Essential hypertension Blood pressure has been low. Her antihypertensive agents have been held. Unclear how much of this is due to dialysis versus her valvular disease.  S/P bilateral BKA (below knee amputation)  Stable  Type 2 diabetes mellitus with ESRD Patient has been started on sliding scale insulin and Accu-Cheks. On oral glipizide, which has been held for now.  Anemia of renal disease Baseline hemoglobin 10.5, stable  ESRD needing dialysis Hemodialysis Monday Wednesday Friday. Nephrology is following.  History of esophagitis Will continue with PPI.    DVT Prophylaxis: On IV heparin    Code Status: Full code  Family Communication: Discussed with the patient  Disposition Plan: Management as outlined above. Await PCI today. Monitor blood pressures closely.    LOS: 2 days   [place] Pager [phone] 07/02/2016, 8:38 AM  If 7PM-7AM, please contact night-coverage at [email or web address], password [facility]

## 2016-07-03 ENCOUNTER — Inpatient Hospital Stay (HOSPITAL_COMMUNITY): Payer: Medicare Other

## 2016-07-03 ENCOUNTER — Encounter (HOSPITAL_COMMUNITY): Payer: Self-pay | Admitting: Internal Medicine

## 2016-07-03 DIAGNOSIS — I959 Hypotension, unspecified: Secondary | ICD-10-CM

## 2016-07-03 DIAGNOSIS — E440 Moderate protein-calorie malnutrition: Secondary | ICD-10-CM | POA: Insufficient documentation

## 2016-07-03 DIAGNOSIS — N186 End stage renal disease: Secondary | ICD-10-CM

## 2016-07-03 LAB — BASIC METABOLIC PANEL
Anion gap: 11 (ref 5–15)
BUN: 30 mg/dL — ABNORMAL HIGH (ref 6–20)
CO2: 26 mmol/L (ref 22–32)
Calcium: 8.6 mg/dL — ABNORMAL LOW (ref 8.9–10.3)
Chloride: 94 mmol/L — ABNORMAL LOW (ref 101–111)
Creatinine, Ser: 6.23 mg/dL — ABNORMAL HIGH (ref 0.44–1.00)
GFR calc Af Amer: 7 mL/min — ABNORMAL LOW (ref >60)
GFR calc non Af Amer: 6 mL/min — ABNORMAL LOW (ref >60)
Glucose, Bld: 80 mg/dL (ref 65–99)
Potassium: 4.7 mmol/L (ref 3.5–5.1)
Sodium: 131 mmol/L — ABNORMAL LOW (ref 135–145)

## 2016-07-03 LAB — CBC
HCT: 25.8 % — ABNORMAL LOW (ref 36.0–46.0)
Hemoglobin: 8.1 g/dL — ABNORMAL LOW (ref 12.0–15.0)
MCH: 28.5 pg (ref 26.0–34.0)
MCHC: 31.4 g/dL (ref 30.0–36.0)
MCV: 90.8 fL (ref 78.0–100.0)
Platelets: 160 10*3/uL (ref 150–400)
RBC: 2.84 MIL/uL — ABNORMAL LOW (ref 3.87–5.11)
RDW: 18.1 % — ABNORMAL HIGH (ref 11.5–15.5)
WBC: 6.2 10*3/uL (ref 4.0–10.5)

## 2016-07-03 LAB — PROCALCITONIN: Procalcitonin: 1.97 ng/mL

## 2016-07-03 LAB — GLUCOSE, CAPILLARY
Glucose-Capillary: 99 mg/dL (ref 65–99)
Glucose-Capillary: 163 mg/dL — ABNORMAL HIGH (ref 65–99)
Glucose-Capillary: 110 mg/dL — ABNORMAL HIGH (ref 65–99)
Glucose-Capillary: 196 mg/dL — ABNORMAL HIGH (ref 65–99)

## 2016-07-03 MED ORDER — SODIUM CHLORIDE 0.9 % IV SOLN: 100.0000 mL | INTRAVENOUS | Status: DC | PRN

## 2016-07-03 MED ORDER — GABAPENTIN 100 MG PO CAPS: 100.0000 mg | ORAL_CAPSULE | Freq: Every day | ORAL | Status: DC

## 2016-07-03 MED ORDER — LIDOCAINE HCL (PF) 1 % IJ SOLN: 5.0000 mL | INTRAMUSCULAR | Status: DC | PRN

## 2016-07-03 MED ORDER — LIDOCAINE-PRILOCAINE 2.5-2.5 % EX CREA: 1.0000 "application " | TOPICAL_CREAM | CUTANEOUS | Status: DC | PRN

## 2016-07-03 MED ORDER — LEVOFLOXACIN 500 MG PO TABS
500.0000 mg | ORAL_TABLET | ORAL | Status: DC
  Administered 2016-07-03: 18:00:00 500 mg via ORAL
  Filled 2016-07-03: qty 1

## 2016-07-03 MED ORDER — PENTAFLUOROPROP-TETRAFLUOROETH EX AERO: 1.0000 "application " | INHALATION_SPRAY | CUTANEOUS | Status: DC | PRN

## 2016-07-03 MED ORDER — TRAMADOL HCL 50 MG PO TABS
50.0000 mg | ORAL_TABLET | Freq: Four times a day (QID) | ORAL | Status: DC | PRN
  Administered 2016-07-03: 50 mg via ORAL
  Filled 2016-07-03: qty 1

## 2016-07-03 MED ORDER — ZOLPIDEM TARTRATE 5 MG PO TABS: 5.0000 mg | ORAL_TABLET | Freq: Once | ORAL | Status: DC | PRN

## 2016-07-03 MED ORDER — HEPARIN SODIUM (PORCINE) 1000 UNIT/ML DIALYSIS: 1000.0000 [IU] | INTRAMUSCULAR | Status: DC | PRN

## 2016-07-03 NOTE — Care Management Note (Addendum)
Case Management Note  Patient Details  Name: Carolyn Valenzuela MRN: [id number] Date of Birth: 1951/05/30  Subjective/Objective:  S/p coronary stent intervention, on plavix and asa, patient has medication coverage ,she has a pcp, and she has transportation at dc.  She has an aide with [employer] that come in for 2 hrs per day (5-7) Mon- Friday, she is bil bka she has prosthesis and she uses a rolling walker, she states she is pretty indep with walker , NCM asked if she needs any HH services she states she does not know if she needs hh services.  She states if so she will like to have [facility].   Will need to await pt eval.  NCM gave private duty list to her niece, who works here,  NCM will cont to follow for dc needs                  Action/Plan:   Expected Discharge Date:                  Expected Discharge Plan:  Home/Self Care  In-House Referral:     Discharge planning Services  CM Consult  Post Acute Care Choice:    Choice offered to:     DME Arranged:    DME Agency:     HH Arranged:    [place] Agency:     Status of Service:  Completed, signed off  If discussed at [employer] of Stay Meetings, dates discussed:    Additional Comments:  Zenon Mayo, RN 07/03/2016, 9:20 AM

## 2016-07-03 NOTE — Progress Notes (Signed)
*  PRELIMINARY RESULTS* Vascular Ultrasound Lower Extremity Arterial Doppler has been completed.  Preliminary findings: negative for right groin pseudoaneurysm.  Everrett Coombe 07/03/2016, 10:34 AM

## 2016-07-03 NOTE — Progress Notes (Signed)
[facility] PROGRESS NOTE  Carolyn Valenzuela YIR:[id number] DOB: 07/25/1951 DOA: 06/29/2016  PCP: Cathlean Cower, MD  Brief History/Interval Summary: 65 year old African-American female with a past medical history of end-stage renal disease on hemodialysis, diabetes, hypertension, peripheral vascular disease, presented to the emergency department after her dialysis due to complaints of worsening shortness of breath. She also mention chest pain. EKG showed nonspecific ST and T wave changes. Troponin was mildly elevated. Patient was seen by cardiology and underwent cardiac catheterization which showed severe diffuse CAD. Patient underwent PCI to the circumflex.  Reason for Visit: Coronary artery disease  Consultants: Nephrology. Cardiology. Cardiothoracic surgery.  Procedures:  Hemodialysis.   Diagnostic Cardiac catheterization  Transthoracic echocardiogram Study Conclusions  - Left ventricle: The cavity size was normal. There was mild focal   basal hypertrophy of the septum. Systolic function was mildly to   moderately reduced. The estimated ejection fraction was in the   range of 40% to 45%. Diffuse hypokinesis. There is hypokinesis of   the lateral and inferolateral myocardium. - Aortic valve: There was severe regurgitation. - Mitral valve: Calcified annulus. Mild prolapse, involving the   anterior leaflet. There was moderate to severe regurgitation   directed posteriorly. - Pulmonary arteries: Systolic pressure was moderately increased.   PA peak pressure: 51 mm Hg (S).  PCI to left circumflex 11/2  Antibiotics: Vancomycin and cefepime changed to Levaquin on 11/1  Subjective/Interval History: Patient feels well this morning. Denied any chest pain or shortness of breath. Hasn't been up and about yet. Denies any pain in her right groin area.  ROS: Denies any nausea or vomiting.  Objective:  Vital Signs  Vitals:   07/03/16 0600 07/03/16 0810 07/03/16 1047 07/03/16  1145  BP: (!) 104/39 (!) 103/37 (!) 95/40 (!) 118/39  Pulse:    72  Resp: 17 17 18  (!) 21  Temp:      TempSrc:      SpO2:  100%  98%  Weight:      Height:        Intake/Output Summary (Last 24 hours) at 07/03/16 1218 Last data filed at 07/03/16 0900  Gross per 24 hour  Intake              240 ml  Output                0 ml  Net              240 ml   Filed Weights   07/01/16 1145 07/01/16 2103 07/03/16 0416  Weight: 51.9 kg (114 lb 6.7 oz) 55.1 kg (121 lb 7.6 oz) 55.7 kg (122 lb 12.7 oz)    General appearance: alert, cooperative, appears stated age and no distress Resp: clear to auscultation bilaterally Cardio: regular rate and rhythm, S1, S2 normal, no murmur, click, rub or gallop GI: soft, non-tender; bowel sounds normal; no masses,  no organomegaly Extremities: extremities normal, atraumatic, no cyanosis or edema. No swelling noted in the right. Neurologic: Awake and alert. Oriented 3. No focal neurological deficits.  Lab Results:  Data Reviewed: I have personally reviewed following labs and imaging studies  CBC:  Recent Labs Lab 06/29/16 1341 06/30/16 0200 07/01/16 0519 07/02/16 0453 07/03/16 0421  WBC 4.6 5.2 6.5 6.2 6.2  NEUTROABS 2.2  --   --   --   --   HGB 10.6* 9.7* 9.5* 8.3* 8.1*  HCT 32.2* 29.5* 28.9* 26.0* 25.8*  MCV 88.7 88.1 88.4 90.0 90.8  PLT 182  172 167 143* 314    Basic Metabolic Panel:  Recent Labs Lab 06/29/16 1341 06/29/16 1631 06/30/16 0200 07/01/16 0519 07/02/16 0453 07/03/16 0421  NA 137  --  135 130* 133* 131*  K 2.9*  --  3.4* 4.3 4.5 4.7  CL 97*  --  98* 95* 96* 94*  CO2 27  --  29 23 26 26   GLUCOSE 120*  --  125* 189* 82 80  BUN 8  --  18 29* 15 30*  CREATININE 2.88*  --  4.20* 6.51* 3.43* 6.23*  CALCIUM 8.9  --  8.2* 8.0* 8.1* 8.6*  MG  --  1.9  --   --   --   --     GFR: Estimated Creatinine Clearance: 6.5 mL/min (by C-G formula based on SCr of 6.23 mg/dL (H)).  Liver Function Tests:  Recent Labs Lab  06/29/16 1341 06/30/16 0200 07/01/16 0519  AST 16 15 29   ALT 7* 7* 12*  ALKPHOS 72 64 54  BILITOT 0.5 0.4 0.7  PROT 7.1 6.1* 5.2*  ALBUMIN 3.6 3.0* 2.8*    Coagulation Profile:  Recent Labs Lab 06/30/16 0200  INR 1.06    Cardiac Enzymes:  Recent Labs Lab 06/29/16 1623 06/29/16 2201 06/30/16 0200  TROPONINI 0.81* 0.75* 0.76*    CBG:  Recent Labs Lab 07/02/16 1139 07/02/16 1653 07/02/16 2100 07/03/16 0613 07/03/16 1208  GLUCAP 114* 89 194* 99 163*     Recent Results (from the past 240 hour(s))  Culture, blood (routine x 2)     Status: None (Preliminary result)   Collection Time: 06/29/16  1:45 PM  Result Value Ref Range Status   Specimen Description BLOOD RIGHT HAND  Final   Special Requests IN PEDIATRIC BOTTLE 4CC  Final   Culture NO GROWTH 3 DAYS  Final   Report Status PENDING  Incomplete  Culture, blood (routine x 2)     Status: None (Preliminary result)   Collection Time: 06/29/16  1:50 PM  Result Value Ref Range Status   Specimen Description BLOOD RIGHT ARM  Final   Special Requests BOTTLES DRAWN AEROBIC AND ANAEROBIC 5CC  Final   Culture NO GROWTH 3 DAYS  Final   Report Status PENDING  Incomplete  Surgical PCR screen     Status: None   Collection Time: 06/30/16  5:31 AM  Result Value Ref Range Status   MRSA, PCR NEGATIVE NEGATIVE Final   Staphylococcus aureus NEGATIVE NEGATIVE Final    Comment:        The Xpert SA Assay (FDA approved for NASAL specimens in patients over 42 years of age), is one component of a comprehensive surveillance program.  Test performance has been validated by [facility] for patients greater than or equal to 56 year old. It is not intended to diagnose infection nor to guide or monitor treatment.       Radiology Studies: Dg Chest Port 1v Same Day  Result Date: 07/02/2016 CLINICAL DATA:  SOB,ESRD Hx of HTN, DM, EXAM: PORTABLE CHEST 1 VIEW COMPARISON:  06/29/2016 FINDINGS: Heart is enlarged. There are no focal  consolidations or pleural effusions. No pulmonary edema. Mild perihilar atelectasis. IMPRESSION: Cardiomegaly. Electronically Signed   By: Nolon Nations M.D.   On: 07/02/2016 20:40     Medications:  Scheduled: . aspirin EC  81 mg Oral Daily  . atorvastatin  80 mg Oral q1800  . clopidogrel  75 mg Oral Daily  . darbepoetin (ARANESP) injection - DIALYSIS  100  mcg Intravenous Q Wed-HD  . doxercalciferol  2 mcg Intravenous Q M,W,F-HD  . famotidine  20 mg Oral Daily  . feeding supplement (NEPRO CARB STEADY)  237 mL Oral BID BM  . feeding supplement (PRO-STAT SUGAR FREE 64)  30 mL Oral BID  . gabapentin  100 mg Oral BID  . heparin  5,000 Units Subcutaneous Q8H  . insulin aspart  0-9 Units Subcutaneous TID WC  . levofloxacin  500 mg Oral Q48H  . multivitamin  1 tablet Oral QHS  . sevelamer carbonate  1,600 mg Oral TID WC  . sodium chloride flush  3 mL Intravenous Q12H  . sodium chloride flush  3 mL Intravenous Q12H   Continuous:   LYY:TKPTWS chloride, sodium chloride, sodium chloride, acetaminophen **OR** acetaminophen, ondansetron **OR** ondansetron (ZOFRAN) IV, sodium chloride flush, sodium chloride flush  Assessment/Plan:  Active Problems:   Hyperlipidemia   Essential hypertension   S/P BKA (below knee amputation) ([place])   Type 2 diabetes mellitus with ESRD (end-stage renal disease) ([place])   Anemia of renal disease   ESRD needing dialysis ([place])   HCAP (healthcare-associated pneumonia)   NSTEMI (non-ST elevated myocardial infarction) ([place])   Elevated troponin   Malnutrition of moderate degree    Healthcare associated pneumonia Patient was started on vancomycin and cefepime based on x-ray findings. Culture have been negative. She is afebrile. WBCs normal. Patient was changed over to Levaquin which will be continued for now.   Non-ST elevation myocardial infarction Patient presents with progressive dyspnea on exertion and atypical chest pain. Concerning for unstable angina  given multiple cardiovascular risk factors. Cardiology was consulted, s/p cath showed severe diffuse CAD. Seen by cardiothoracic surgery. Not a candidate for bypass. Patient underwent PCI to circumflex yesterday. Continue aspirin and Plavix. Also, on statin. Cardiology is following. Echocardiogram shows valvular heart disease with severe aortic regurgitation as well as moderate to severe mitral regurgitation. These are being addressed by cardiology.  Hyperlipidemia Continue statin  Hypotension in a patient with a known history of Essential hypertension Blood pressure has been low. Her antihypertensive agents have been held. Unclear how much of this is due to dialysis versus her valvular disease. Discussed with nephrology today. They will evaluate the patient and see how she does with dialysis. Question if midodrine could be used in this patient.  S/P bilateral BKA (below knee amputation)  Stable. Patient ambulates using prosthesis.  Type 2 diabetes mellitus with ESRD Patient has been started on sliding scale insulin and Accu-Cheks. On oral glipizide at home, which has been held for now.  Anemia of renal disease Baseline hemoglobin 10.5, stable  ESRD needing dialysis Hemodialysis Monday Wednesday Friday. Nephrology is following. To be dialyzed today  History of esophagitis Will continue with PPI.    DVT Prophylaxis: Now on subcutaneous heparin  Code Status: Full code  Family Communication: Discussed with the patient  Disposition Plan: Low blood pressure remains a concern. Dialysis today. To be mobilized. Anticipate discharge tomorrow if she remains stable. PT evaluation.    LOS: 3 days   [place] [facility] Pager [phone] 07/03/2016, 12:18 PM  If 7PM-7AM, please contact night-coverage at [email or web address], password [facility]

## 2016-07-03 NOTE — Progress Notes (Signed)
[facility] Progress Note  Dialysis Orders: [place] MWF 4h F180 BFR 400 2K/2.25 Ca UF Profile 4 EDW 50kg L AVF Hectorol 21mcg q HD Venofer 50mg  IV q week (last 10/25) Mircera 50 mcg IV q 2 weeks (last 10/18) OP Labs: Hgb 10.2 Tsat 26%  K 3.8 Corr Ca 9.3 PTH 514    Assessment: 1. NSTEMI/CAD  - severe 2V CAD by cath, had PCI/ stent to LCx lesion yest.  2. PNA - CXR with possible infiltrate in LLL, on levaquin po 3. ESRD - MWF HD 4. Anemia - Hgb 8.3 Aranesp 156mcg 11/1/ holding Fe with ?infection restart if blood cultures neg 5. Secondary hyperparathyroidism - Ca 8.1 cont Hectorol/binders  6. Hypotension - not sure cause, rechecked w manual cuff 95/ 40. Holding norvasc.  7. Volume - up 2kg today, no SOB, UF same w HD today 8. Valvular heart disease/syst HF by echo - severe aortic and mod-severe mitral regurgitation by echo, EF 40-45% 9. Nutrition - Alb 2.8 cont vitamins/protein supp  Plan - HD today, UF 1.5- 2kg as BP tolerates  Kelly Splinter MD [employer] pgr [phone]   07/03/2016, 11:18 AM   Subjective:  No SOB, coughing which is new.  No other c/o's.    Objective Vitals:   07/03/16 0500 07/03/16 0600 07/03/16 0810 07/03/16 1047  BP: (!) 98/37 (!) 104/39 (!) 103/37 (!) 95/40  Pulse:      Resp: 17 17 17 18   Temp:      TempSrc:      SpO2:   100%   Weight:      Height:       Physical Exam General: Frail appearing elderly AAF NAD Heart: RRR U2G2 holoysystolic murmur Lungs: Breathing unlabored Bibaslar crackles  Abdomen: soft NT/ND Extremities: bilat BKA sites no edema  Dialysis Access: LUE AVF +bruit  Additional Objective Labs: Basic Metabolic Panel:  Recent Labs Lab 07/01/16 0519 07/02/16 0453 07/03/16 0421  NA 130* 133* 131*  K 4.3 4.5 4.7  CL 95* 96* 94*  CO2 23 26 26   GLUCOSE 189* 82 80  BUN 29* 15 30*  CREATININE 6.51* 3.43* 6.23*  CALCIUM 8.0* 8.1* 8.6*   Liver Function Tests:  Recent Labs Lab 06/29/16 1341  06/30/16 0200 07/01/16 0519  AST 16 15 29   ALT 7* 7* 12*  ALKPHOS 72 64 54  BILITOT 0.5 0.4 0.7  PROT 7.1 6.1* 5.2*  ALBUMIN 3.6 3.0* 2.8*   No results for input(s): LIPASE, AMYLASE in the last 168 hours. CBC:  Recent Labs Lab 06/29/16 1341 06/30/16 0200 07/01/16 0519 07/02/16 0453 07/03/16 0421  WBC 4.6 5.2 6.5 6.2 6.2  NEUTROABS 2.2  --   --   --   --   HGB 10.6* 9.7* 9.5* 8.3* 8.1*  HCT 32.2* 29.5* 28.9* 26.0* 25.8*  MCV 88.7 88.1 88.4 90.0 90.8  PLT 182 172 167 143* 160   Blood Culture    Component Value Date/Time   SDES BLOOD RIGHT ARM 06/29/2016 1350   SPECREQUEST BOTTLES DRAWN AEROBIC AND ANAEROBIC 5CC 06/29/2016 1350   CULT NO GROWTH 3 DAYS 06/29/2016 1350   REPTSTATUS PENDING 06/29/2016 1350    Cardiac Enzymes:  Recent Labs Lab 06/29/16 1623 06/29/16 2201 06/30/16 0200  TROPONINI 0.81* 0.75* 0.76*   CBG:  Recent Labs Lab 07/02/16 0735 07/02/16 1139 07/02/16 1653 07/02/16 2100 07/03/16 0613  GLUCAP 119* 114* 89 194* 99   Iron Studies: No results for input(s): IRON, TIBC, TRANSFERRIN, FERRITIN in  the last 72 hours. Lab Results  Component Value Date   INR 1.06 06/30/2016   INR 1.22 12/11/2015   INR 1.10 09/02/2014   Medications:   . aspirin EC  81 mg Oral Daily  . atorvastatin  80 mg Oral q1800  . clopidogrel  75 mg Oral Daily  . darbepoetin (ARANESP) injection - DIALYSIS  100 mcg Intravenous Q Wed-HD  . doxercalciferol  2 mcg Intravenous Q M,W,F-HD  . famotidine  20 mg Oral Daily  . feeding supplement (NEPRO CARB STEADY)  237 mL Oral BID BM  . feeding supplement (PRO-STAT SUGAR FREE 64)  30 mL Oral BID  . gabapentin  100 mg Oral BID  . heparin  5,000 Units Subcutaneous Q8H  . insulin aspart  0-9 Units Subcutaneous TID WC  . levofloxacin  500 mg Oral Q48H  . multivitamin  1 tablet Oral QHS  . sevelamer carbonate  1,600 mg Oral TID WC  . sodium chloride flush  3 mL Intravenous Q12H  . sodium chloride flush  3 mL Intravenous Q12H

## 2016-07-03 NOTE — Progress Notes (Signed)
    Subjective:  Denies CP or dyspnea.   Objective:  Vitals:   07/03/16 0416 07/03/16 0500 07/03/16 0600 07/03/16 0810  BP: (!) 85/46 (!) 98/37 (!) 104/39 (!) 103/37  Pulse: 67     Resp: 20 17 17 17   Temp: 97.5 F (36.4 C)     TempSrc: Oral     SpO2: 100%   100%  Weight: 122 lb 12.7 oz (55.7 kg)     Height:        Intake/Output from previous day:  Intake/Output Summary (Last 24 hours) at 07/03/16 0851 Last data filed at 07/02/16 2000  Gross per 24 hour  Intake              120 ml  Output                0 ml  Net              120 ml    Physical Exam: Physical exam: Well-developed chronically ill appearing in no acute distress.  Skin is warm and dry.  HEENT is normal.  Neck is supple.  Chest is clear to auscultation with normal expansion.  Cardiovascular exam is regular rate and rhythm. 2/6 systolic murmur Abdominal exam nontender or distended. No masses palpated. Right groin with no hematoma; positive bruit Extremities s/p bilateral BKA neuro grossly intact    Lab Results: Basic Metabolic Panel:  Recent Labs  07/02/16 0453 07/03/16 0421  NA 133* 131*  K 4.5 4.7  CL 96* 94*  CO2 26 26  GLUCOSE 82 80  BUN 15 30*  CREATININE 3.43* 6.23*  CALCIUM 8.1* 8.6*   CBC:  Recent Labs  07/02/16 0453 07/03/16 0421  WBC 6.2 6.2  HGB 8.3* 8.1*  HCT 26.0* 25.8*  MCV 90.0 90.8  PLT 143* 160     Assessment/Plan:  65 year old female with end-stage renal disease, peripheral vascular disease, diabetes mellitus admitted with exertional dyspnea and atypical chest pain. Troponin abnormal consistent with non-ST elevation myocardial infarction. Cardiac cath with 2 vessel CAD. Thought not to be a candidate for CABG per Dr Servando Snare.   1 non-ST elevation myocardial infarction-Not a candidate for CABG. Now s/p PCI of Lcx. Continue aspirin, plavix, and statin. Need ultrasound right groin to exclude pseudoaneurysm.  2 hyperlipidemia-continue Lipitor 80 mg daily. Check  lipids and liver in 4 weeks.   3 hypotension-mildly hypotensive this AM; no meds; follow.  4 End-stage renal disease-dialysis per nephrology.   5 question pneumonia-clinically does not appear to have pneumonia. Management per primary care.   6 Valvular heart disease-severe AI and moderate to severe MR on echo; difficult situation as felt not to a candidate for CABG; will need FU echos following revascularization.  If no pseudoaneurysm on doppler and BP improves, pt can be DCed from a cardiac standpoint and FU with PA 1 week for TOC appt. FU with me in 3 months.  Kirk Ruths 07/03/2016, 8:51 AM

## 2016-07-03 NOTE — Progress Notes (Signed)
Initial Nutrition Assessment  DOCUMENTATION CODES:   Non-severe (moderate) malnutrition in context of chronic illness  INTERVENTION:   - Continue Nepro with Carb Steady oral nutrition supplement BID between meals. Each provides 425 kcal and 19.1 grams protein. - Continue Pro-stat 30 mL BID to provide 200 kcal and 30 grams protein total. - Encourage PO intake.  NUTRITION DIAGNOSIS:   Malnutrition related to chronic illness as evidenced by mild depletion of body fat, moderate depletions of muscle mass.  GOAL:   Patient will meet greater than or equal to 90% of their needs  MONITOR:   PO intake, Supplement acceptance, Labs, Weight trends, I & O's  REASON FOR ASSESSMENT:   Malnutrition Screening Tool   ASSESSMENT:   65 year old female with end-stage renal disease (HD initiated 1/2-14/2016) MWF, diabetes, hypertension, dyslipidemia, peripheral vascular disease, who presents to the ER after dialysis due to complaints of worsening shortness of breath with exertion for the last 2 weeks. Prior history of intermittent dysphagia to solids and liquids. Recent upper endoscopy 03/19/16 showed grade C ulcerative reflux type esophagitis with surrounding edema.  Spoke with pt at bedside who reports appetite has been "up and down" for the past year. Per chart eating 0-100% of meals since admission. Pt states she consumed 2-3 meals PTA. Breakfast would include pt cooking 2 eggs cooked in bacon grease and 1 pancake with syrup. Pt reports eating lunch and dinner at restaurants; states she might consume a sandwich and chicken wings. Pt states she has tried to locate Nepro oral nutrition supplements at her local pharmacy but was unable to find them; pt enjoys these. Pt has been drinking Ensure oral nutrition supplements PTA. Pt reports she does not like Pro-stat oral nutrition supplement. Discussed ways to make it more appetizing such as combining with Sprite Zero. Pt amenable to trying.  Pt unaware of her  UBW. Per Nurse Tech in room at time of visit, pt was just weighed at 114#.  Medications reviewed and include 80 mg Lipitor daily, 75 mg Plavix daily, 20 mg Pepcid daily, 5,000 units heparin TID, sliding scale Novolog, renal MVI, PRN Zofran  Labs reviewed and include low sodium (131 mmol/L), low chloride (92 mmol/L), elevated BUN (30 mg/dL), elevated creatinine, (6.23 mg/dL), low calcium (8.6 mg/dL), low hemoglobin (8.1 g/dL) CBG's: 89-194 mg/dL  NFPE: Exam completed. Mild fat depletion, moderate muscle depletion, and no edema noted.  Diet Order:  Diet renal with fluid restriction Fluid restriction: 1200 mL Fluid; Room service appropriate? Yes; Fluid consistency: Thin  Skin:  Reviewed, no issues  Last BM:  07/01/16  Height:   Ht Readings from Last 1 Encounters:  06/29/16 4\' 9"  (1.448 m)    Weight:   Wt Readings from Last 1 Encounters:  07/03/16 122 lb 12.7 oz (55.7 kg)    Ideal Body Weight:  33.6 kg (double BKA)  BMI:  Body mass index is 26.57 kg/m.  Estimated Nutritional Needs:   Kcal:  1400-1600 (27-30 kcal/kg ABW)  Protein:  >/= 69 grams (>/= 1.3 g/kg ABW)  Fluid:  </= 1.2 L/day  EDUCATION NEEDS:   No education needs identified at this time  Jeb Levering Dietetic Intern Pager Number: [phone]

## 2016-07-03 NOTE — Progress Notes (Signed)
CARDIAC REHAB PHASE I   PRE:  Rate/Rhythm: 71 SR    BP: lying 103/37    SaO2: 92 RA  MODE:  Ambulation: 0 ft, just sat up in bed   POST:  Rate/Rhythm: 73 SR    BP: sitting 92/37, second attempt 84/36     SaO2: 80 RA, up to 97 2L  Pt sts she normally ambulates with bilateral prostheses and RW, which are all in room. However, she sat forward in bed and c/o dizziness. Had to lie back. BP lower, continued to c/o some dizziness. Had pt attempt again but dizziness worse, to the point she could not visually find me for a few seconds. BP 84/36. Felt better once she lied back. Also SaO2 down to 80 RA with good wave form on pleth. Pt denied SOB but I reapplied 2L (saO2 only 83 on 1L). Discussed MI, stent, Plavix/ASA. Not sure at this point if she is a candidate for CRPII. She has HD on same days as CRPII as well. Will f/u tomorrow. Pt for HD today. I do not feel pt will be safe going home tonight as she lives alone. Could benefit from PT c/s for assist with d/c planning and mobilization.  [place], ACSM 07/03/2016 9:12 AM

## 2016-07-03 NOTE — Progress Notes (Signed)
Pharmacy Antibiotic Note  Carolyn Valenzuela is a 65 y.o. female admitted on 06/29/2016 with CP and SOB on exertion. The patient was started on Vancomycin + Cefepime  and transitioned to Levaquin for HCAP (day 5 antibiotics)  Plan: -Change levaquin to 500mg  po q48hr (consider 7 days total) -Will sign off. Please contact pharmacy with any other needs.   Height: 4\' 9"  (144.8 cm) Weight: 122 lb 12.7 oz (55.7 kg) IBW/kg (Calculated) : 38.6  Temp (24hrs), Avg:98.7 F (37.1 C), Min:97.5 F (36.4 C), Max:99.9 F (37.7 C)   Recent Labs Lab 06/29/16 1341 06/29/16 1352 06/30/16 0200 07/01/16 0519 07/02/16 0453 07/03/16 0421  WBC 4.6  --  5.2 6.5 6.2 6.2  CREATININE 2.88*  --  4.20* 6.51* 3.43* 6.23*  LATICACIDVEN  --  1.31  --   --   --   --     Estimated Creatinine Clearance: 6.5 mL/min (by C-G formula based on SCr of 6.23 mg/dL (H)).    Allergies  Allergen Reactions  . Oxycodone Itching    Antimicrobials this admission: Vancomycin 10/30 >> 11/1 * Doses: 1g LD on 10/30 (a little low, est level ~20 mcg/ml), 500 mg on 11/1 (post-HD) * HD sessions 11/1 (4 hr BFR 400) Cefepime 10/30 >> 11/1 Levaquin 11/1 >>  Dose adjustments this admission:  N/A  Microbiology results:  10/30 BCx >> ngtd 10/31 MRSA PCR: neg Sputum: ordered  Thank you for allowing pharmacy to be a part of this patient's care.  Hildred Laser, Pharm D 07/03/2016 10:21 AM

## 2016-07-03 NOTE — Care Management Important Message (Signed)
Important Message  Patient Details  Name: Carolyn Valenzuela MRN: [id number] Date of Birth: 1950/10/03   Medicare Important Message Given:  Yes    Orbie Pyo 07/03/2016, 11:38 AM

## 2016-07-04 DIAGNOSIS — I25110 Atherosclerotic heart disease of native coronary artery with unstable angina pectoris: Secondary | ICD-10-CM

## 2016-07-04 LAB — CULTURE, BLOOD (ROUTINE X 2)
Culture: NO GROWTH
Culture: NO GROWTH

## 2016-07-04 LAB — GLUCOSE, CAPILLARY
Glucose-Capillary: 184 mg/dL — ABNORMAL HIGH (ref 65–99)
Glucose-Capillary: 224 mg/dL — ABNORMAL HIGH (ref 65–99)

## 2016-07-04 MED ORDER — ATORVASTATIN CALCIUM 80 MG PO TABS: 80.0000 mg | ORAL_TABLET | Freq: Every day | ORAL | 2 refills | Status: DC

## 2016-07-04 MED ORDER — RENA-VITE PO TABS: 1.0000 | ORAL_TABLET | Freq: Every day | ORAL | 0 refills | Status: DC

## 2016-07-04 MED ORDER — CLOPIDOGREL BISULFATE 75 MG PO TABS: 75.0000 mg | ORAL_TABLET | Freq: Every day | ORAL | 2 refills | Status: DC

## 2016-07-04 MED ORDER — LEVOFLOXACIN 500 MG PO TABS: 500.0000 mg | ORAL_TABLET | ORAL | 0 refills | Status: DC

## 2016-07-04 NOTE — Progress Notes (Signed)
[facility] Progress Note   Subjective: "They're letting me go home today!" Patient is concerned about EDW because she is usually weighed at center with bilateral LE prothesis in place and subtract 3.8 from total wt. Denies chest pain/SOB.   Objective Vitals:   07/03/16 1926 07/04/16 0524 07/04/16 0742 07/04/16 0746  BP: (!) 111/30 (!) 114/52 (!) 119/53   Pulse: 75 72 71   Resp:   11   Temp: 99.4 F (37.4 C) 98.5 F (36.9 C) 98.2 F (36.8 C)   TempSrc: Oral Oral Oral   SpO2: 91% 95% (!) 88% 92%  Weight:  49.1 kg (108 lb 3.9 oz)    Height:       Physical Exam General: Pleasant, NAD Heart: S1, S2 2/6 systolic M Lungs: BBS few fine crackles LLL otherwise CTA.  Abdomen: Soft, non-tender Extremities: Bilateral BKA. No stump edema Dialysis Access: LUA AVF + Bruit  Dialysis Orders: [place] MWF 4h F180 BFR 400 2K/2.25 Ca UF Profile 4 EDW 50kg L AVF Hectorol 52mcg q HD Venofer 50mg  IV q week (last 10/25) Mircera 50 mcg IV q 2 weeks (last 10/18) OP Labs: Hgb 10.2 Tsat 26% K 3.8 Corr Ca 9.3 PTH 514   Additional Objective Labs: Basic Metabolic Panel:  Recent Labs Lab 07/01/16 0519 07/02/16 0453 07/03/16 0421  NA 130* 133* 131*  K 4.3 4.5 4.7  CL 95* 96* 94*  CO2 23 26 26   GLUCOSE 189* 82 80  BUN 29* 15 30*  CREATININE 6.51* 3.43* 6.23*  CALCIUM 8.0* 8.1* 8.6*   Liver Function Tests:  Recent Labs Lab 06/29/16 1341 06/30/16 0200 07/01/16 0519  AST 16 15 29   ALT 7* 7* 12*  ALKPHOS 72 64 54  BILITOT 0.5 0.4 0.7  PROT 7.1 6.1* 5.2*  ALBUMIN 3.6 3.0* 2.8*   No results for input(s): LIPASE, AMYLASE in the last 168 hours. CBC:  Recent Labs Lab 06/29/16 1341 06/30/16 0200 07/01/16 0519 07/02/16 0453 07/03/16 0421  WBC 4.6 5.2 6.5 6.2 6.2  NEUTROABS 2.2  --   --   --   --   HGB 10.6* 9.7* 9.5* 8.3* 8.1*  HCT 32.2* 29.5* 28.9* 26.0* 25.8*  MCV 88.7 88.1 88.4 90.0 90.8  PLT 182 172 167 143* 160   Blood Culture    Component Value Date/Time    SDES BLOOD RIGHT ARM 06/29/2016 1350   SPECREQUEST BOTTLES DRAWN AEROBIC AND ANAEROBIC 5CC 06/29/2016 1350   CULT NO GROWTH 4 DAYS 06/29/2016 1350   REPTSTATUS PENDING 06/29/2016 1350    Cardiac Enzymes:  Recent Labs Lab 06/29/16 1623 06/29/16 2201 06/30/16 0200  TROPONINI 0.81* 0.75* 0.76*   CBG:  Recent Labs Lab 07/03/16 0613 07/03/16 1208 07/03/16 1826 07/03/16 2139 07/04/16 0623  GLUCAP 99 163* 110* 196* 184*   Studies/Results: Dg Chest Port 1 View  Result Date: 07/03/2016 CLINICAL DATA:  Shortness of Breath EXAM: PORTABLE CHEST 1 VIEW COMPARISON:  07/02/2016 FINDINGS: Cardiac shadow remains enlarged. Mild vascular congestion is seen without interstitial edema or sizable effusion. No infiltrates are noted. No bony abnormality is seen. IMPRESSION: Mild CHF. Electronically Signed   By: Inez Catalina M.D.   On: 07/03/2016 12:39   Dg Chest Port 1v Same Day  Result Date: 07/02/2016 CLINICAL DATA:  SOB,ESRD Hx of HTN, DM, EXAM: PORTABLE CHEST 1 VIEW COMPARISON:  06/29/2016 FINDINGS: Heart is enlarged. There are no focal consolidations or pleural effusions. No pulmonary edema. Mild perihilar atelectasis. IMPRESSION: Cardiomegaly. Electronically Signed   By:  Nolon Nations M.D.   On: 07/02/2016 20:40   Medications:   . aspirin EC  81 mg Oral Daily  . atorvastatin  80 mg Oral q1800  . clopidogrel  75 mg Oral Daily  . darbepoetin (ARANESP) injection - DIALYSIS  100 mcg Intravenous Q Wed-HD  . doxercalciferol  2 mcg Intravenous Q M,W,F-HD  . famotidine  20 mg Oral Daily  . feeding supplement (NEPRO CARB STEADY)  237 mL Oral BID BM  . feeding supplement (PRO-STAT SUGAR FREE 64)  30 mL Oral BID  . gabapentin  100 mg Oral QHS  . heparin  5,000 Units Subcutaneous Q8H  . insulin aspart  0-9 Units Subcutaneous TID WC  . levofloxacin  500 mg Oral Q48H  . multivitamin  1 tablet Oral QHS  . sevelamer carbonate  1,600 mg Oral TID WC  . sodium chloride flush  3 mL Intravenous  Q12H  . sodium chloride flush  3 mL Intravenous Q12H     Assessment: 1. NSTEMI/CAD  - severe 2V CAD by cath, had PCI/ stent to LCx lesion 07/02/16. No Chest pain. Dual antiplatelet therapy on DC (ASA/Plavix). Negative for R groin pseudoaneurysm.   2. PNA - CXR with possible infiltrate in LLL, on levaquin po-renal dose. No fevers this AM. WBC 6.2 3. ESRD - MWF HD Next HD Monday at [place].  4. Anemia - Hgb 8.3 Aranesp 194mcg 11/1/ holding Fe with ?infection restart if blood cultures neg 5. Secondary hyperparathyroidism - Ca 8.6 cont Hectorol/binders  6. Hypotension/Volume-HD 11/03 had issues with hypotension. Pre wt 51.8 kg Net UF 2500 Post wt 49.2 kg. Lower EDW on DC-new EDW 49 kg.  7. Valvular heart disease/syst HF by echo - severe aortic and mod-severe mitral regurgitation by echo, EF 40-45% 8. Nutrition - Alb 2.8. Renal diet.  cont vitamins/protein supp  Rita H. Brown NP-C 07/04/2016, 9:25 AM  [facility] [phone]  Pt seen, examined and agree w A/P as above. Doing better, had HD and wt's are below edw slightly today. OK for dc from our standpoint.  Kelly Splinter MD [employer] pager [phone]   07/04/2016, 11:54 AM

## 2016-07-04 NOTE — Discharge Instructions (Signed)
Coronary Angiogram With Stent Coronary angiography with stent placement is a procedure to widen or open a narrow blood vessel of the heart (coronary artery). When a coronary artery becomes partially blocked, it decreases blood flow to that area. This may lead to chest pain or a heart attack (myocardial infarction). Arteries may become blocked by cholesterol buildup (plaque) in the lining or wall.  A stent is a small piece of metal that looks like a mesh or a spring. Stent placement may be done right after a coronary angiography in which a blocked artery is found or as a treatment for a heart attack.  LET [facility] CARE PROVIDER KNOW ABOUT:  Any allergies you have.   All medicines you are taking, including vitamins, herbs, eye drops, creams, and over-the-counter medicines.   Previous problems you or members of your family have had with the use of anesthetics.   Any blood disorders you have.   Previous surgeries you have had.   Medical conditions you have. RISKS AND COMPLICATIONS Generally, coronary angiography with stent is a safe procedure. However, problems can occur and include:  Damage to the heart or its blood vessels.   A return of blockage.   Bleeding, infection, or bruising at the insertion site.   A collection of blood under the skin (hematoma) at the insertion site.  Blood clot in another part of the body.   Kidney injury.   Allergic reaction to the dye or contrast used.   Bleeding into the abdomen (retroperitoneal bleeding). BEFORE THE PROCEDURE  Do not eat or drink anything after midnight on the night before the procedure or as directed by your health care provider.  Ask your health care provider about changing or stopping your regular medicines. This is especially important if you are taking diabetes medicines or blood thinners.  Your health care provider will make sure you understand the procedure as well as the risks and potential problems  associated with the procedure.  PROCEDURE  You may be given a medicine to help you relax before and during the procedure (sedative). This medicine will be given through an IV tube that is put into one of your veins.   The area where the catheter will be inserted will be shaved and cleaned. This is usually done in the groin but may be done in the fold of your arm (near your elbow) or in the wrist.   A medicine will be given to numb the area where the catheter will be inserted (local anesthetic).   The catheter will be inserted into an artery using a guide wire. A type of X-ray (fluoroscopy) will be used to help guide the catheter to the opening of the blocked artery.   A dye will then be injected into the catheter, and X-rays will be taken. The dye will help to show where any narrowing or blockages are located in the heart arteries.   A tiny wire will be guided to the blocked spot, and a balloon will be inflated to make the artery wider. The stent will be expanded and will crush the plaque into the wall of the vessel. The stent will hold the area open like a scaffolding and improve the blood flow.   Sometimes the artery may be made wider using a laser or other tools to remove plaque.   When the blood flow is better, the catheter will be removed. The lining of the artery will grow over the stent, which stays where it was placed.  AFTER THE PROCEDURE  If the procedure is done through the leg, you will be kept in bed lying flat for about 6 hours. You will be instructed to not bend or cross your legs.   The insertion site will be checked frequently.   The pulse in your feet or wrist will be checked frequently.   Additional blood tests, X-rays, and electrocardiography may be done.   This information is not intended to replace advice given to you by your health care provider. Make sure you discuss any questions you have with your health care provider.   Document Released:  02/21/2003 Document Revised: 09/07/2014 Document Reviewed: 01/09/2013 Elsevier Interactive Patient Education [employer].

## 2016-07-04 NOTE — Progress Notes (Signed)
CARDIAC REHAB PHASE I   PRE:  Rate/Rhythm: 72 SR  BP:  Supine:   Sitting: 130/52  Standing:    SaO2: 96  MODE:  Ambulation: ft No ambulation, assisted to side of bed  POST:  Rate/Rhythm:   BP:  Supine:   Sitting:  Standing:    SaO2:   In to ambulate pt in hallway.  Assisted pt with placing bilateral prosthesis, underwear and hospital gown.  Pt moved slowly and complained of feeling hungry.  Pt awaiting requested oatmeal. Pt asked for assistance in securing belongings for pending transfer to [place].  Niece who is an employee here in environmental services stopped in to visit pt.   Niece voiced concerns regarding possible discharge home.  Pt sister called niece and also voiced concerns about pt going home with no support.  Niece asked staff about resources her aunt may be eligible to received.  Informed primary Rn for social work consult to assess home needs and safety needs.  Pt oatmeal arrived and pt did not want to ambulate with CR complained of feeling too weak because she had not had anything to eat. Pt declined to ambulate with CR staff.  Cherre Huger, BSN

## 2016-07-04 NOTE — Evaluation (Signed)
Physical Therapy Evaluation and Discharge Patient Details Name: Carolyn Valenzuela MRN: [id number] DOB: 12-Aug-1951 Today's Date: 07/04/2016   History of Present Illness  65 y.o. female with Hx of ESRD, admitted for NSTEMI/CAD, and PNA.  Clinical Impression  Patient evaluated by Physical Therapy with no further acute PT needs identified. All education has been completed and the patient has no further questions. She is mobilizing without physical assist, and did not demonstrate any buckling or loss of balance. Patient states she feels weak from being in bed for 2 days and will certainly benefit from a HHPT follow-up to assist with building her strength and mobility back to baseline. States she has several family members that will check on her during the coming days but is confident she can manage her ADLs without assistance. See below for any follow-up Physical Therapy or equipment needs. PT is signing off. Thank you for this referral.     Follow Up Recommendations Home health PT;Supervision - Intermittent    Equipment Recommendations  None recommended by PT    Recommendations for Other Services       Precautions / Restrictions Precautions Precautions: Fall Required Braces or Orthoses: Other Brace/Splint Other Brace/Splint: BIL prosthetics Restrictions Weight Bearing Restrictions: No      Mobility  Bed Mobility Overal bed mobility: Modified Independent             General bed mobility comments: extra time  Transfers Overall transfer level: Modified independent Equipment used: Rolling walker (2 wheeled) Transfers: Sit to/from Stand Sit to Stand: Modified independent (Device/Increase time)         General transfer comment: Appropriately uses UEs to rise from bed. Demonstrates weakness with posterior knees supported on bed to rise. BP did not drop.  Ambulation/Gait Ambulation/Gait assistance: Supervision Ambulation Distance (Feet): 100 Feet Assistive device: Rolling  walker (2 wheeled) Gait Pattern/deviations: Step-through pattern;Decreased stride length Gait velocity: slow Gait velocity interpretation: <1.8 ft/sec, indicative of risk for recurrent falls General Gait Details: Slow but stable with support of rolling walker. Supervision provided for safety during first ambulatory bout. States she feels weak but steady. No buckling or loss of balance noted during bout.  Stairs            Wheelchair Mobility    Modified Rankin (Stroke Patients Only)       Balance Overall balance assessment: Needs assistance Sitting-balance support: No upper extremity supported;Feet supported Sitting balance-Leahy Scale: Good     Standing balance support: No upper extremity supported Standing balance-Leahy Scale: Fair                               Pertinent Vitals/Pain Pain Assessment: No/denies pain    Home Living Family/patient expects to be discharged to:: Private residence Living Arrangements: Alone Available Help at Discharge: Family;Available PRN/intermittently Type of Home: Apartment Home Access: Ramped entrance     Home Layout: One level Home Equipment: Walker - 2 wheels;Cane - quad;Wheelchair - manual;Shower seat      Prior Function Level of Independence: Independent with assistive device(s)               Hand Dominance   Dominant Hand: Right    Extremity/Trunk Assessment   Upper Extremity Assessment: Defer to OT evaluation           Lower Extremity Assessment:  (BIL BKA, wears prosthetics)         Communication   Communication: No difficulties  Cognition  Arousal/Alertness: Awake/alert Behavior During Therapy: WFL for tasks assessed/performed Overall Cognitive Status: Within Functional Limits for tasks assessed                      General Comments General comments (skin integrity, edema, etc.): Pt not orthostatic today. Feels weak but overall feels better now that she has gotten out of  bed.    Exercises     Assessment/Plan    PT Assessment Patent does not need any further PT services  PT Problem List            PT Treatment Interventions      PT Goals (Current goals can be found in the Care Plan section)  Acute Rehab PT Goals Patient Stated Goal: Go home today. PT Goal Formulation: All assessment and education complete, DC therapy    Frequency     Barriers to discharge        Co-evaluation               End of Session Equipment Utilized During Treatment: Gait belt Activity Tolerance: Patient tolerated treatment well Patient left: in chair;with call bell/phone within reach;with chair alarm set Nurse Communication: Mobility status         Time: 1135-1200 PT Time Calculation (min) (ACUTE ONLY): 25 min   Charges:   PT Evaluation $PT Eval Low Complexity: 1 Procedure PT Treatments $Gait Training: 8-22 mins   PT G CodesEllouise Valenzuela 07/04/2016, 12:13 PM  Carolyn Valenzuela, Carolyn Valenzuela

## 2016-07-04 NOTE — Discharge Summary (Signed)
[facility]  Physician Discharge Summary   Patient ID: MORGANNA STYLES MRN: [id number] DOB/AGE: Mar 21, 1951 65 y.o.  Admit date: 06/29/2016 Discharge date: 07/04/2016  PCP: Cathlean Cower, MD  DISCHARGE DIAGNOSES:  Active Problems:   Hyperlipidemia   Essential hypertension   S/P BKA (below knee amputation) ([place])   Type 2 diabetes mellitus with ESRD (end-stage renal disease) ([place])   Anemia of renal disease   ESRD needing dialysis ([place])   HCAP (healthcare-associated pneumonia)   NSTEMI (non-ST elevated myocardial infarction) ([place])   Elevated troponin   Malnutrition of moderate degree   Hypotension   RECOMMENDATIONS FOR OUTPATIENT FOLLOW UP: 1. Cardiology to arrange outpatient follow-up   DISCHARGE CONDITION: fair  Diet recommendation: Heart healthy  Filed Weights   07/03/16 1245 07/03/16 1650 07/04/16 0524  Weight: 51.8 kg (114 lb 3.2 oz) 49.2 kg (108 lb 7.5 oz) 49.1 kg (108 lb 3.9 oz)    INITIAL HISTORY: 65 year old African-American female with a past medical history of end-stage renal disease on hemodialysis, diabetes, hypertension, peripheral vascular disease, presented to the emergency department after her dialysis due to complaints of worsening shortness of breath. She also mention chest pain. EKG showed nonspecific ST and T wave changes. Troponin was mildly elevated. Patient was seen by cardiology and underwent cardiac catheterization which showed severe diffuse CAD. Patient underwent PCI to the circumflex.  Consultants: Nephrology. Cardiology. Cardiothoracic surgery.  Procedures:  Hemodialysis.   Diagnostic Cardiac catheterization  Transthoracic echocardiogram Study Conclusions  - Left ventricle: The cavity size was normal. There was mild focal basal hypertrophy of the septum. Systolic function was mildly to moderately reduced. The estimated ejection fraction was in the range of 40% to 45%. Diffuse hypokinesis. There is hypokinesis of the  lateral and inferolateral myocardium. - Aortic valve: There was severe regurgitation. - Mitral valve: Calcified annulus. Mild prolapse, involving the anterior leaflet. There was moderate to severe regurgitation directed posteriorly. - Pulmonary arteries: Systolic pressure was moderately increased. PA peak pressure: 51 mm Hg (S).  PCI to left circumflex 11/2   HOSPITAL COURSE:   Healthcare associated pneumonia Patient was started on vancomycin and cefepime based on x-ray findings. Culture have been negative. She is afebrile. WBC is normal. Patient was changed over to Levaquin which will be continued for a few more days.  Non-ST elevation myocardial infarction Patient presented with progressive dyspnea on exertion and atypical chest pain. Concerning for unstable angina given multiple cardiovascular risk factors. Cardiology was consulted, s/p cath showed severe diffuse CAD. Seen by cardiothoracic surgery. Not a candidate for bypass. Patient underwent PCI to circumflex. Continue aspirin and Plavix. Also, on statin. Echocardiogram shows valvular heart disease with severe aortic regurgitation as well as moderate to severe mitral regurgitation. These are being addressed by cardiology. Outpatient follow-up with cardiology.  Hyperlipidemia Continue statin.  Hypotension in a patient with a known history of Essential hypertension Blood pressure has been somewhat on the lower side. Her antihypertensive agents have been held. Blood pressure appears to have stabilized. She did okay with dialysis yesterday. We will recommend holding her amlodipine as well as the Lasix. Unclear how much of this is due to dialysis versus her valvular disease. Outpatient monitoring of the same.  S/P bilateral BKA (below knee amputation)  Stable. Patient ambulates using prosthesis. Seen by physical therapy today. Home health to be ordered.  Type 2 diabetes mellitus with ESRD She may resume her home  medications.  Anemia of renal disease Baseline hemoglobin 10.5, stable  ESRD needing dialysis Hemodialysis Monday  Wednesday Friday. Seen by nephrology in the hospital and was dialyzed per usual schedule.  History of esophagitis Continue home medications.  Overall, stable. Discussed with her sister with patient's permission. Stable for discharge today.   PERTINENT LABS:  The results of significant diagnostics from this hospitalization (including imaging, microbiology, ancillary and laboratory) are listed below for reference.    Microbiology: Recent Results (from the past 240 hour(s))  Culture, blood (routine x 2)     Status: None   Collection Time: 06/29/16  1:45 PM  Result Value Ref Range Status   Specimen Description BLOOD RIGHT HAND  Final   Special Requests IN PEDIATRIC BOTTLE 4CC  Final   Culture NO GROWTH 5 DAYS  Final   Report Status 07/04/2016 FINAL  Final  Culture, blood (routine x 2)     Status: None   Collection Time: 06/29/16  1:50 PM  Result Value Ref Range Status   Specimen Description BLOOD RIGHT ARM  Final   Special Requests BOTTLES DRAWN AEROBIC AND ANAEROBIC 5CC  Final   Culture NO GROWTH 5 DAYS  Final   Report Status 07/04/2016 FINAL  Final  Surgical PCR screen     Status: None   Collection Time: 06/30/16  5:31 AM  Result Value Ref Range Status   MRSA, PCR NEGATIVE NEGATIVE Final   Staphylococcus aureus NEGATIVE NEGATIVE Final    Comment:        The Xpert SA Assay (FDA approved for NASAL specimens in patients over 18 years of age), is one component of a comprehensive surveillance program.  Test performance has been validated by [facility] for patients greater than or equal to 9 year old. It is not intended to diagnose infection nor to guide or monitor treatment.      Labs: Basic Metabolic Panel:  Recent Labs Lab 06/29/16 1341 06/29/16 1631 06/30/16 0200 07/01/16 0519 07/02/16 0453 07/03/16 0421  NA 137  --  135 130* 133* 131*    K 2.9*  --  3.4* 4.3 4.5 4.7  CL 97*  --  98* 95* 96* 94*  CO2 27  --  29 23 26 26   GLUCOSE 120*  --  125* 189* 82 80  BUN 8  --  18 29* 15 30*  CREATININE 2.88*  --  4.20* 6.51* 3.43* 6.23*  CALCIUM 8.9  --  8.2* 8.0* 8.1* 8.6*  MG  --  1.9  --   --   --   --    Liver Function Tests:  Recent Labs Lab 06/29/16 1341 06/30/16 0200 07/01/16 0519  AST 16 15 29   ALT 7* 7* 12*  ALKPHOS 72 64 54  BILITOT 0.5 0.4 0.7  PROT 7.1 6.1* 5.2*  ALBUMIN 3.6 3.0* 2.8*   CBC:  Recent Labs Lab 06/29/16 1341 06/30/16 0200 07/01/16 0519 07/02/16 0453 07/03/16 0421  WBC 4.6 5.2 6.5 6.2 6.2  NEUTROABS 2.2  --   --   --   --   HGB 10.6* 9.7* 9.5* 8.3* 8.1*  HCT 32.2* 29.5* 28.9* 26.0* 25.8*  MCV 88.7 88.1 88.4 90.0 90.8  PLT 182 172 167 143* 160   Cardiac Enzymes:  Recent Labs Lab 06/29/16 1623 06/29/16 2201 06/30/16 0200  TROPONINI 0.81* 0.75* 0.76*    CBG:  Recent Labs Lab 07/03/16 1208 07/03/16 1826 07/03/16 2139 07/04/16 0623 07/04/16 1130  GLUCAP 163* 110* 196* 184* 224*     IMAGING STUDIES Dg Chest 2 View  Result Date: 06/29/2016 CLINICAL DATA:  Two  weeks of chest pain and shortness of breath. History of hypertension, diabetes, nonsmoker. EXAM: CHEST  2 VIEW COMPARISON:  PA and lateral chest x-ray of December 11, 2015. FINDINGS: The lungs are reasonably well inflated the interstitial markings are mildly increased chronically. There are coarse retrocardiac lung markings on the left which are more conspicuous than in the past. The heart is top-normal in size. The pulmonary vascularity is not engorged. There is calcification in the wall of the aortic arch. There is no pleural effusion. The bony thorax exhibits no acute abnormality. There are chronic degenerative changes of the right glenohumeral joint. IMPRESSION: Atelectasis or infiltrate in the left lower lobe superimposed upon chronic fibrotic changes. No evidence of CHF. Followup PA and lateral chest X-ray is  recommended in 3-4 weeks following trial of antibiotic therapy to ensure resolution and exclude underlying malignancy. Electronically Signed   By: David  Martinique M.D.   On: 06/29/2016 13:02   Dg Chest Port 1 View  Result Date: 07/03/2016 CLINICAL DATA:  Shortness of Breath EXAM: PORTABLE CHEST 1 VIEW COMPARISON:  07/02/2016 FINDINGS: Cardiac shadow remains enlarged. Mild vascular congestion is seen without interstitial edema or sizable effusion. No infiltrates are noted. No bony abnormality is seen. IMPRESSION: Mild CHF. Electronically Signed   By: Inez Catalina M.D.   On: 07/03/2016 12:39   Dg Chest Port 1v Same Day  Result Date: 07/02/2016 CLINICAL DATA:  SOB,ESRD Hx of HTN, DM, EXAM: PORTABLE CHEST 1 VIEW COMPARISON:  06/29/2016 FINDINGS: Heart is enlarged. There are no focal consolidations or pleural effusions. No pulmonary edema. Mild perihilar atelectasis. IMPRESSION: Cardiomegaly. Electronically Signed   By: Nolon Nations M.D.   On: 07/02/2016 20:40    DISCHARGE EXAMINATION: Vitals:   07/04/16 0742 07/04/16 0745 07/04/16 0746 07/04/16 0900  BP: (!) 119/53 (!) 119/53  (!) 130/50  Pulse: 71 70  72  Resp: 11 11  14   Temp: 98.2 F (36.8 C)     TempSrc: Oral     SpO2: (!) 88% (!) 86% 92% 92%  Weight:      Height:       General appearance: alert, cooperative, appears stated age and no distress Resp: clear to auscultation bilaterally Cardio: S1, S2 is normal, regular. Systolic murmur appreciated over the precordium. GI: soft, non-tender; bowel sounds normal; no masses,  no organomegaly   DISPOSITION: Home with sister  Discharge Instructions    Call MD for:  difficulty breathing, headache or visual disturbances    Complete by:  As directed    Call MD for:  extreme fatigue    Complete by:  As directed    Call MD for:  persistant dizziness or light-headedness    Complete by:  As directed    Call MD for:  persistant nausea and vomiting    Complete by:  As directed    Call MD  for:  severe uncontrolled pain    Complete by:  As directed    Call MD for:  temperature >100.4    Complete by:  As directed    Diet - low sodium heart healthy    Complete by:  As directed    Discharge instructions    Complete by:  As directed    Please keep usual dialysis schedule. Cardiology office will call you for follow-up.  You were cared for by a hospitalist during your hospital stay. If you have any questions about your discharge medications or the care you received while you were in the hospital after  you are discharged, you can call the unit and asked to speak with the hospitalist on call if the hospitalist that took care of you is not available. Once you are discharged, your primary care physician will handle any further medical issues. Please note that NO REFILLS for any discharge medications will be authorized once you are discharged, as it is imperative that you return to your primary care physician (or establish a relationship with a primary care physician if you do not have one) for your aftercare needs so that they can reassess your need for medications and monitor your lab values. If you do not have a primary care physician, you can call [phone] for a physician referral.   Increase activity slowly    Complete by:  As directed       ALLERGIES:  Allergies  Allergen Reactions  . Oxycodone Itching     Current Discharge Medication List    START taking these medications   Details  atorvastatin (LIPITOR) 80 MG tablet Take 1 tablet (80 mg total) by mouth daily at 6 PM. Qty: 30 tablet, Refills: 2    clopidogrel (PLAVIX) 75 MG tablet Take 1 tablet (75 mg total) by mouth daily. Qty: 30 tablet, Refills: 2    levofloxacin (LEVAQUIN) 500 MG tablet Take 1 tablet (500 mg total) by mouth every other day. For 4 more doses Qty: 4 tablet, Refills: 0    multivitamin (RENA-VIT) TABS tablet Take 1 tablet by mouth at bedtime. Qty: 30 tablet, Refills: 0      CONTINUE these  medications which have NOT CHANGED   Details  acetaminophen (TYLENOL) 325 MG tablet Take 2 tablets (650 mg total) by mouth every 6 (six) hours as needed for mild pain, moderate pain or fever.    aspirin 81 MG EC tablet Take 81 mg by mouth daily.      [employer] ACID REDUCER 10 MG tablet TAKE 1 TABLET (10 MG TOTAL) BY MOUTH DAILY. Qty: 30 tablet, Refills: 1    gabapentin (NEURONTIN) 100 MG capsule Take 1 capsule by mouth 2 (two) times daily. Refills: 3    glipiZIDE (GLUCOTROL XL) 10 MG 24 hr tablet Take 1 tablet (10 mg total) by mouth daily with breakfast. Qty: 90 tablet, Refills: 3    ONETOUCH DELICA LANCETS FINE MISC Use to help check blood sugars twice a day Dx e11.9 Qty: 100 each, Refills: 3    senna (SENOKOT) 8.6 MG TABS tablet Take 2 tablets (17.2 mg total) by mouth 2 (two) times daily before a meal. Qty: 120 each, Refills: 1    sevelamer carbonate (RENVELA) 800 MG tablet Take 1,600 mg by mouth 3 (three) times daily with meals.    traZODone (DESYREL) 50 MG tablet TAKE 0.5-1 TABLETS (25-50 MG TOTAL) BY MOUTH AT BEDTIME AS NEEDED FOR SLEEP. Qty: 30 tablet, Refills: 1    glucose blood (ONETOUCH VERIO) test strip 1 each by Other route 2 (two) times daily. Use to check blood sugars twice a day Dx E11.9 Qty: 100 each, Refills: 3    pantoprazole (PROTONIX) 40 MG tablet Take 1 tablet (40 mg total) by mouth 2 (two) times daily before a meal. Qty: 60 tablet, Refills: 1      STOP taking these medications     amLODipine (NORVASC) 10 MG tablet      simvastatin (ZOCOR) 20 MG tablet      furosemide (LASIX) 40 MG tablet      oxyCODONE-acetaminophen (PERCOCET/ROXICET) 5-325 MG tablet  Follow-up Information    Quay Burow, MD .   Specialties:  Cardiology, Radiology Why:  The office will call you within 2-3 business days to arrange follow-up. If you do not hear from them in that time frame, please call the number provided. Contact information: [address] [place] [address] [phone]           TOTAL DISCHARGE TIME: 35 minutes  [facility] Pager [phone]  07/04/2016, 2:43 PM

## 2016-07-07 ENCOUNTER — Encounter: Payer: Self-pay | Admitting: Internal Medicine

## 2016-07-07 ENCOUNTER — Emergency Department (HOSPITAL_COMMUNITY): Payer: BC Managed Care – PPO

## 2016-07-07 ENCOUNTER — Telehealth: Payer: Self-pay | Admitting: *Deleted

## 2016-07-07 ENCOUNTER — Ambulatory Visit (INDEPENDENT_AMBULATORY_CARE_PROVIDER_SITE_OTHER): Payer: Medicare Other | Admitting: Internal Medicine

## 2016-07-07 ENCOUNTER — Other Ambulatory Visit (INDEPENDENT_AMBULATORY_CARE_PROVIDER_SITE_OTHER): Payer: Medicare Other

## 2016-07-07 ENCOUNTER — Ambulatory Visit (INDEPENDENT_AMBULATORY_CARE_PROVIDER_SITE_OTHER)
Admission: RE | Admit: 2016-07-07 | Discharge: 2016-07-07 | Disposition: A | Discharge status: Home / Self Care | Payer: Medicare Other | Source: Ambulatory Visit | Attending: Internal Medicine | Admitting: Internal Medicine

## 2016-07-07 ENCOUNTER — Encounter (HOSPITAL_COMMUNITY): Payer: Self-pay | Admitting: Emergency Medicine

## 2016-07-07 ENCOUNTER — Emergency Department (HOSPITAL_COMMUNITY)
Admission: EM | Admit: 2016-07-07 | Discharge: 2016-07-07 | Disposition: A | Discharge status: Home / Self Care | Payer: BC Managed Care – PPO | Attending: Emergency Medicine | Admitting: Emergency Medicine

## 2016-07-07 VITALS — BP 140/76 | HR 90 | Temp 98.4°F | Resp 20 | Wt 114.0 lb

## 2016-07-07 DIAGNOSIS — I5022 Chronic systolic (congestive) heart failure: Secondary | ICD-10-CM | POA: Insufficient documentation

## 2016-07-07 DIAGNOSIS — Z7982 Long term (current) use of aspirin: Secondary | ICD-10-CM | POA: Insufficient documentation

## 2016-07-07 DIAGNOSIS — I132 Hypertensive heart and chronic kidney disease with heart failure and with stage 5 chronic kidney disease, or end stage renal disease: Secondary | ICD-10-CM | POA: Insufficient documentation

## 2016-07-07 DIAGNOSIS — I739 Peripheral vascular disease, unspecified: Secondary | ICD-10-CM

## 2016-07-07 DIAGNOSIS — R938 Abnormal findings on diagnostic imaging of other specified body structures: Secondary | ICD-10-CM | POA: Diagnosis not present

## 2016-07-07 DIAGNOSIS — R0789 Other chest pain: Secondary | ICD-10-CM | POA: Diagnosis not present

## 2016-07-07 DIAGNOSIS — E782 Mixed hyperlipidemia: Secondary | ICD-10-CM

## 2016-07-07 DIAGNOSIS — J189 Pneumonia, unspecified organism: Secondary | ICD-10-CM | POA: Insufficient documentation

## 2016-07-07 DIAGNOSIS — Z992 Dependence on renal dialysis: Secondary | ICD-10-CM | POA: Diagnosis not present

## 2016-07-07 DIAGNOSIS — I2510 Atherosclerotic heart disease of native coronary artery without angina pectoris: Secondary | ICD-10-CM | POA: Diagnosis not present

## 2016-07-07 DIAGNOSIS — E1140 Type 2 diabetes mellitus with diabetic neuropathy, unspecified: Secondary | ICD-10-CM | POA: Insufficient documentation

## 2016-07-07 DIAGNOSIS — E1122 Type 2 diabetes mellitus with diabetic chronic kidney disease: Secondary | ICD-10-CM | POA: Insufficient documentation

## 2016-07-07 DIAGNOSIS — K209 Esophagitis, unspecified: Secondary | ICD-10-CM

## 2016-07-07 DIAGNOSIS — I119 Hypertensive heart disease without heart failure: Secondary | ICD-10-CM | POA: Diagnosis not present

## 2016-07-07 DIAGNOSIS — Z955 Presence of coronary angioplasty implant and graft: Secondary | ICD-10-CM | POA: Diagnosis not present

## 2016-07-07 DIAGNOSIS — N186 End stage renal disease: Secondary | ICD-10-CM | POA: Insufficient documentation

## 2016-07-07 DIAGNOSIS — R079 Chest pain, unspecified: Secondary | ICD-10-CM | POA: Diagnosis not present

## 2016-07-07 DIAGNOSIS — I252 Old myocardial infarction: Secondary | ICD-10-CM | POA: Diagnosis not present

## 2016-07-07 DIAGNOSIS — R072 Precordial pain: Secondary | ICD-10-CM | POA: Diagnosis present

## 2016-07-07 DIAGNOSIS — I10 Essential (primary) hypertension: Secondary | ICD-10-CM | POA: Diagnosis not present

## 2016-07-07 DIAGNOSIS — R9389 Abnormal findings on diagnostic imaging of other specified body structures: Secondary | ICD-10-CM

## 2016-07-07 HISTORY — DX: Atherosclerotic heart disease of native coronary artery without angina pectoris: I25.10

## 2016-07-07 HISTORY — DX: Esophagitis, unspecified: K20.9

## 2016-07-07 HISTORY — DX: Nonrheumatic mitral (valve) insufficiency: I34.0

## 2016-07-07 HISTORY — DX: Chronic systolic (congestive) heart failure: I50.22

## 2016-07-07 HISTORY — DX: End stage renal disease: N18.6

## 2016-07-07 HISTORY — DX: Esophagitis, unspecified without bleeding: K20.90

## 2016-07-07 HISTORY — DX: Peripheral vascular disease, unspecified: I73.9

## 2016-07-07 HISTORY — DX: Nonrheumatic aortic (valve) insufficiency: I35.1

## 2016-07-07 HISTORY — DX: End stage renal disease: Z99.2

## 2016-07-07 LAB — CBC WITH DIFFERENTIAL/PLATELET
Basophils Absolute: 0.1 10*3/uL (ref 0.0–0.1)
Basophils Relative: 1.2 % (ref 0.0–3.0)
Eosinophils Absolute: 0.1 10*3/uL (ref 0.0–0.7)
Eosinophils Relative: 1.1 % (ref 0.0–5.0)
HCT: 31 % — ABNORMAL LOW (ref 36.0–46.0)
Hemoglobin: 10.4 g/dL — ABNORMAL LOW (ref 12.0–15.0)
Lymphocytes Relative: 24.5 % (ref 12.0–46.0)
Lymphs Abs: 1.8 10*3/uL (ref 0.7–4.0)
MCHC: 33.4 g/dL (ref 30.0–36.0)
MCV: 90.9 fl (ref 78.0–100.0)
Monocytes Absolute: 0.8 10*3/uL (ref 0.1–1.0)
Monocytes Relative: 11.5 % (ref 3.0–12.0)
Neutro Abs: 4.5 10*3/uL (ref 1.4–7.7)
Neutrophils Relative %: 61.7 % (ref 43.0–77.0)
Platelets: 322 10*3/uL (ref 150.0–400.0)
RBC: 3.41 Mil/uL — ABNORMAL LOW (ref 3.87–5.11)
RDW: 20.2 % — ABNORMAL HIGH (ref 11.5–15.5)
WBC: 7.3 10*3/uL (ref 4.0–10.5)

## 2016-07-07 LAB — HEPATIC FUNCTION PANEL
ALT: 14 U/L (ref 0–35)
AST: 16 U/L (ref 0–37)
Albumin: 3.9 g/dL (ref 3.5–5.2)
Alkaline Phosphatase: 85 U/L (ref 39–117)
Bilirubin, Direct: 0.1 mg/dL (ref 0.0–0.3)
Total Bilirubin: 0.4 mg/dL (ref 0.2–1.2)
Total Protein: 7.3 g/dL (ref 6.0–8.3)

## 2016-07-07 LAB — TROPONIN I
TNIDX: 0.66 ug/l — ABNORMAL HIGH (ref 0.00–0.06)
Troponin I: 0.5 ng/mL (ref <0.03)
Troponin I: 0.49 ng/mL (ref <0.03)

## 2016-07-07 LAB — CK TOTAL AND CKMB (NOT AT ARMC)
CK, MB: 0.7 ng/mL (ref 0.0–5.0)
Relative Index: 1.3 (ref 0.0–4.0)
Total CK: 54 U/L (ref 7–177)

## 2016-07-07 LAB — BASIC METABOLIC PANEL
Anion gap: 13 (ref 5–15)
BUN: 21 mg/dL (ref 6–23)
BUN: 20 mg/dL (ref 6–20)
CO2: 35 mEq/L — ABNORMAL HIGH (ref 19–32)
CO2: 29 mmol/L (ref 22–32)
Calcium: 9.3 mg/dL (ref 8.4–10.5)
Calcium: 9.1 mg/dL (ref 8.9–10.3)
Chloride: 95 mEq/L — ABNORMAL LOW (ref 96–112)
Chloride: 94 mmol/L — ABNORMAL LOW (ref 101–111)
Creatinine, Ser: 5.72 mg/dL (ref 0.40–1.20)
Creatinine, Ser: 5.94 mg/dL — ABNORMAL HIGH (ref 0.44–1.00)
GFR calc Af Amer: 8 mL/min — ABNORMAL LOW (ref >60)
GFR calc non Af Amer: 7 mL/min — ABNORMAL LOW (ref >60)
GFR: 9.59 mL/min — CL (ref >60.00)
Glucose, Bld: 254 mg/dL — ABNORMAL HIGH (ref 70–99)
Glucose, Bld: 282 mg/dL — ABNORMAL HIGH (ref 65–99)
Potassium: 3.6 mEq/L (ref 3.5–5.1)
Potassium: 3.4 mmol/L — ABNORMAL LOW (ref 3.5–5.1)
Sodium: 138 mEq/L (ref 135–145)
Sodium: 136 mmol/L (ref 135–145)

## 2016-07-07 LAB — I-STAT TROPONIN, ED: Troponin i, poc: 0.56 ng/mL (ref 0.00–0.08)

## 2016-07-07 LAB — I-STAT CHEM 8, ED
BUN: 23 mg/dL — ABNORMAL HIGH (ref 6–20)
Calcium, Ion: 1.06 mmol/L — ABNORMAL LOW (ref 1.15–1.40)
Chloride: 93 mmol/L — ABNORMAL LOW (ref 101–111)
Creatinine, Ser: 5.6 mg/dL — ABNORMAL HIGH (ref 0.44–1.00)
Glucose, Bld: 280 mg/dL — ABNORMAL HIGH (ref 65–99)
HCT: 32 % — ABNORMAL LOW (ref 36.0–46.0)
Hemoglobin: 10.9 g/dL — ABNORMAL LOW (ref 12.0–15.0)
Potassium: 3.4 mmol/L — ABNORMAL LOW (ref 3.5–5.1)
Sodium: 137 mmol/L (ref 135–145)
TCO2: 31 mmol/L (ref 0–100)

## 2016-07-07 LAB — CBC
HCT: 31.5 % — ABNORMAL LOW (ref 36.0–46.0)
Hemoglobin: 10 g/dL — ABNORMAL LOW (ref 12.0–15.0)
MCH: 29.2 pg (ref 26.0–34.0)
MCHC: 31.7 g/dL (ref 30.0–36.0)
MCV: 92.1 fL (ref 78.0–100.0)
Platelets: 305 10*3/uL (ref 150–400)
RBC: 3.42 MIL/uL — ABNORMAL LOW (ref 3.87–5.11)
RDW: 19.1 % — ABNORMAL HIGH (ref 11.5–15.5)
WBC: 7 10*3/uL (ref 4.0–10.5)

## 2016-07-07 LAB — D-DIMER, QUANTITATIVE (NOT AT ARMC): D-Dimer, Quant: 1.04 mcg/mL FEU — ABNORMAL HIGH (ref <0.50)

## 2016-07-07 MED ORDER — PANTOPRAZOLE SODIUM 40 MG PO TBEC: 40.0000 mg | DELAYED_RELEASE_TABLET | Freq: Two times a day (BID) | ORAL | 1 refills | Status: DC

## 2016-07-07 MED ORDER — PANTOPRAZOLE SODIUM 40 MG PO TBEC
40.0000 mg | DELAYED_RELEASE_TABLET | Freq: Once | ORAL | Status: AC
  Administered 2016-07-07: 40 mg via ORAL
  Filled 2016-07-07: qty 1

## 2016-07-07 NOTE — Consult Note (Signed)
Cardiology Consultation Note    Patient ID: LANAYA BENNIS, MRN: [id number], DOB/AGE: February 13, 1951 65 y.o. Admit date: 07/07/2016   Date of Consult: 07/07/2016 Primary Physician: Cathlean Cower, MD Primary Cardiologist: Gwenlyn Found  Chief Complaint: CP Reason for Consultation: elevated troponin Requesting MD: Dr. Kathrynn Humble  HPI: AJANEE BUREN is a 65 y.o. female with history of ESRD on HD, DM, HTN, PVD s/p bilateral BKA, CAD (not a CABG candidate, s/p DES to LCx , chronic systolic CHF (EF 10-62%), severe aortic regurgitation, moderate to severe mitral regurgitation, elevated PA pressure, anemia of CKD, HLD, h/o esophagitis presented to [facility] with elevated troponin. Was admitted just last week with chest pain and NSTEMI (trop 0.7) - not felt to be a candidate for CABG. Underwent PCI of the LCx with DES, valvular disease to be followed following revascularization. Presented to PCP's office today for atypical chest pain - OP troponin drawn which was 0.66, neg CKMB. Patient was instructed to report to ED for further eval. She reports this AM she had approximately 1 minute of sharp chest discomfort at rest, resolving spontaneously, without any associated SOB, dizziness, palpitations or nausea. No radiation of pain. She does note a sensation of elevated HR with any activity. Her family says she doesn't seem to have any symptoms when she's at rest, but doesn't feel good with exertion - some chest discomfort and dyspnea. Currently CP free in the ED since this AM's episode. Subsequent values in ED 0.50 then 0.49, flat. Labs otherwise notable for Hgb 10, K 3.4, Cr 5.94 c/w prior. CXR at PCP's office nonacute. VSS. Also reports food gets stuck when she eats. Sees GI -  Had EGD 02/2016 for abnormal esophagram showing reflux esophagitis and distal gastritis. She is maintained on BID PPI.  BP normal in ED but she reports BP tends to drop when in dialysis.  Past Medical History:  Diagnosis Date  . Allergic rhinitis, cause  unspecified 12/08/2010  . ANEMIA-IRON DEFICIENCY 01/25/2008  . Aortic regurgitation   . CAD (coronary artery disease)    a. NSTEMI 07/2016 - not felt to be a CABG candidate, s/p PCI/DES of LCx.  . CERVICAL RADICULOPATHY, LEFT 01/25/2008  . Chronic systolic CHF (congestive heart failure) ([place])   . Critical lower limb ischemia   . DIABETES MELLITUS, UNCONTROLLED 05/21/2009  . Esophagitis   . ESRD on dialysis ([place])   . Foot ulcer ([place])    right lateral malleolus  . HCAP (healthcare-associated pneumonia) 06/29/2016  . HYPERLIPIDEMIA 03/30/2007  . HYPERTENSION 01/25/2008  . Mitral regurgitation   . PVD (peripheral vascular disease) ([place])    a. s/p bilateral amputations  . SECONDARY HYPERPARATHYROIDISM 05/21/2009      Surgical History:  Past Surgical History:  Procedure Laterality Date  . AMPUTATION Right 03/15/2014   Procedure: RIGHT  LEG  BELOW KNEE AMPUTATION ;  Surgeon: [name], MD;  Location: [place];  Service: Orthopedics;  Laterality: Right;  . AMPUTATION Left 11/29/2015   Procedure: AMPUTATION BELOW KNEE;  Surgeon: Newt Minion, MD;  Location: [place];  Service: Orthopedics;  Laterality: Left;  . AV FISTULA PLACEMENT Left   . AV FISTULA PLACEMENT Left 09/06/2014   Procedure: ARTERIOVENOUS (AV) FISTULA CREATION-Left Brachiocephalic;  Surgeon: Conrad Dalton, MD;  Location: [place];  Service: Vascular;  Laterality: Left;  . CARDIAC CATHETERIZATION N/A 06/30/2016   Procedure: Left Heart Cath and Coronary Angiography;  Surgeon: Burnell Blanks, MD;  Location: [place] [facility];  Service: Cardiovascular;  Laterality:  N/A;  . CARDIAC CATHETERIZATION N/A 07/02/2016   Procedure: Coronary Stent Intervention;  Surgeon: Nelva Bush, MD;  Location: [place] [facility];  Service: Cardiovascular;  Laterality: N/A;  . COLONOSCOPY    . CORONARY ANGIOPLASTY WITH STENT PLACEMENT  07/02/2016  . ESOPHAGOGASTRODUODENOSCOPY N/A 03/20/2016   Procedure: ESOPHAGOGASTRODUODENOSCOPY (EGD);  Surgeon: Milus Banister, MD;  Location: [place];  Service: Endoscopy;  Laterality: N/A;  . EYE SURGERY Bilateral    cataracts removed, left eye still has some oil in it.  . INSERTION OF DIALYSIS CATHETER N/A 09/03/2014   Procedure: INSERTION OF DIALYSIS CATHETER RIGHT INTERNAL JUGULAR VEIN;  Surgeon: Conrad Alfarata, MD;  Location: [place];  Service: Vascular;  Laterality: N/A;  . PERIPHERAL VASCULAR CATHETERIZATION N/A 08/13/2015   Procedure: Abdominal Aortogram;  Surgeon: Elam Dutch, MD;  Location: [place] [facility];  Service: Cardiovascular;  Laterality: N/A;     Home Meds: Prior to Admission medications   Medication Sig Start Date End Date Taking? Authorizing Provider  aspirin 81 MG EC tablet Take 81 mg by mouth daily.     Yes Historical Provider, MD  atorvastatin (LIPITOR) 80 MG tablet Take 1 tablet (80 mg total) by mouth daily at 6 PM. 07/04/16  Yes Bonnielee Haff, MD  clopidogrel (PLAVIX) 75 MG tablet Take 1 tablet (75 mg total) by mouth daily. 07/04/16  Yes Bonnielee Haff, MD  [employer] ACID REDUCER 10 MG tablet TAKE 1 TABLET (10 MG TOTAL) BY MOUTH DAILY. 03/13/16  Yes Ivan Anchors Love, PA-C  gabapentin (NEURONTIN) 100 MG capsule Take 1 capsule by mouth 2 (two) times daily. 10/29/15  Yes Historical Provider, MD  glipiZIDE (GLUCOTROL XL) 10 MG 24 hr tablet Take 1 tablet (10 mg total) by mouth daily with breakfast. 03/12/16  Yes Biagio Borg, MD  glucose blood (ONETOUCH VERIO) test strip 1 each by Other route 2 (two) times daily. Use to check blood sugars twice a day Dx E11.9 01/09/16  Yes Biagio Borg, MD  levofloxacin (LEVAQUIN) 500 MG tablet Take 1 tablet (500 mg total) by mouth every other day. For 4 more doses 07/05/16  Yes Bonnielee Haff, MD  multivitamin (RENA-VIT) TABS tablet Take 1 tablet by mouth at bedtime. 07/04/16  Yes Bonnielee Haff, MD  [facility] DELICA LANCETS FINE MISC Use to help check blood sugars twice a day Dx e11.9 01/09/16  Yes Biagio Borg, MD  pantoprazole (PROTONIX) 40 MG tablet Take 1  tablet (40 mg total) by mouth 2 (two) times daily before a meal. 03/20/16  Yes Theodis Blaze, MD  sevelamer carbonate (RENVELA) 800 MG tablet Take 1,600 mg by mouth 3 (three) times daily with meals.   Yes Historical Provider, MD  traZODone (DESYREL) 50 MG tablet TAKE 0.5-1 TABLETS (25-50 MG TOTAL) BY MOUTH AT BEDTIME AS NEEDED FOR SLEEP. 02/18/16  Yes Ankit Lorie Phenix, MD  acetaminophen (TYLENOL) 325 MG tablet Take 2 tablets (650 mg total) by mouth every 6 (six) hours as needed for mild pain, moderate pain or fever. 09/13/14   Modena Jansky, MD  senna (SENOKOT) 8.6 MG TABS tablet Take 2 tablets (17.2 mg total) by mouth 2 (two) times daily before a meal. Patient taking differently: Take 2 tablets by mouth daily as needed for mild constipation.  12/20/15   Bary Leriche, PA-C    Inpatient Medications:     Allergies:  Allergies  Allergen Reactions  . Oxycodone Itching    Social History   Social History  .  Marital status: Single    Spouse name: N/A  . Number of children: N/A  . Years of education: N/A   Occupational History  . [place] History Main Topics  . Smoking status: Never Smoker  . Smokeless tobacco: Never Used  . Alcohol use No  . Drug use: No  . Sexual activity: Not on file   Other Topics Concern  . Not on file   Social History Narrative  . No narrative on file     Family History  Problem Relation Age of Onset  . Cancer Mother     Pancreatic     Review of Systems: does note some dysphagia from time to time. All other systems reviewed and are otherwise negative except as noted above.  Labs:  Recent Labs  07/07/16 1222 07/07/16 1512 07/07/16 1832  CKTOTAL 54  --   --   CKMB 0.7  --   --   TROPONINI  --  0.50* 0.49*   Lab Results  Component Value Date   WBC 7.0 07/07/2016   HGB 10.9 (L) 07/07/2016   HCT 32.0 (L) 07/07/2016   MCV 92.1 07/07/2016   PLT 305 07/07/2016     Recent Labs Lab 07/07/16 1222  07/07/16 1512 07/07/16 1533  NA 138 136 137  K 3.6 3.4* 3.4*  CL 95* 94* 93*  CO2 35* 29  --   BUN 21 20 23*  CREATININE 5.72* 5.94* 5.60*  CALCIUM 9.3 9.1  --   PROT 7.3  --   --   BILITOT 0.4  --   --   ALKPHOS 85  --   --   ALT 14  --   --   AST 16  --   --   GLUCOSE 254* 282* 280*   Lab Results  Component Value Date   CHOL 213 (H) 05/28/2015   HDL 52.30 05/28/2015   LDLCALC 135 (H) 05/28/2015   TRIG 131.0 05/28/2015   Lab Results  Component Value Date   DDIMER 1.04 (H) 07/07/2016    Radiology/Studies:  Dg Chest 2 View  Result Date: 07/07/2016 CLINICAL DATA:  65 y/o F; sharp chest pain. Stent placed last week. EXAM: CHEST  2 VIEW COMPARISON:  07/03/2016 chest radiating FINDINGS: Stable borderline cardiomegaly. Linear left basilar opacities probably represent atelectasis or scarring. No focal consolidation. No effusion or pneumothorax. Mild degenerative changes of the thoracic spine. IMPRESSION: Stable borderline cardiomegaly.  No acute pulmonary process. Electronically Signed   By: Kristine Garbe M.D.   On: 07/07/2016 15:57   Dg Chest 2 View  Result Date: 06/29/2016 CLINICAL DATA:  Two weeks of chest pain and shortness of breath. History of hypertension, diabetes, nonsmoker. EXAM: CHEST  2 VIEW COMPARISON:  PA and lateral chest x-ray of December 11, 2015. FINDINGS: The lungs are reasonably well inflated the interstitial markings are mildly increased chronically. There are coarse retrocardiac lung markings on the left which are more conspicuous than in the past. The heart is top-normal in size. The pulmonary vascularity is not engorged. There is calcification in the wall of the aortic arch. There is no pleural effusion. The bony thorax exhibits no acute abnormality. There are chronic degenerative changes of the right glenohumeral joint. IMPRESSION: Atelectasis or infiltrate in the left lower lobe superimposed upon chronic fibrotic changes. No evidence of CHF. Followup PA  and lateral chest X-ray is recommended in 3-4 weeks following trial of antibiotic therapy to ensure resolution and exclude underlying malignancy.  Electronically Signed   By: David  Martinique M.D.   On: 06/29/2016 13:02   Dg Chest Port 1 View  Result Date: 07/03/2016 CLINICAL DATA:  Shortness of Breath EXAM: PORTABLE CHEST 1 VIEW COMPARISON:  07/02/2016 FINDINGS: Cardiac shadow remains enlarged. Mild vascular congestion is seen without interstitial edema or sizable effusion. No infiltrates are noted. No bony abnormality is seen. IMPRESSION: Mild CHF. Electronically Signed   By: Inez Catalina M.D.   On: 07/03/2016 12:39   Dg Chest Port 1v Same Day  Result Date: 07/02/2016 CLINICAL DATA:  SOB,ESRD Hx of HTN, DM, EXAM: PORTABLE CHEST 1 VIEW COMPARISON:  06/29/2016 FINDINGS: Heart is enlarged. There are no focal consolidations or pleural effusions. No pulmonary edema. Mild perihilar atelectasis. IMPRESSION: Cardiomegaly. Electronically Signed   By: Nolon Nations M.D.   On: 07/02/2016 20:40    Wt Readings from Last 3 Encounters:  07/07/16 114 lb (51.7 kg)  07/07/16 114 lb (51.7 kg)  07/04/16 108 lb 3.9 oz (49.1 kg)    EKG: NSR 81bpm, nonspecific ST-T changes similar to prior  Physical Exam: Blood pressure (!) 119/54, pulse 72, temperature 98.4 F (36.9 C), temperature source Oral, resp. rate 13, weight 114 lb (51.7 kg), SpO2 99 %. Body mass index is 24.67 kg/m. General: Well developed thin elderly AAF, in no acute distress. Head: Normocephalic, atraumatic, sclera non-icteric, no xanthomas, nares are without discharge.  Neck: JVD not elevated. Lungs: Clear bilaterally to auscultation without wheezes, rales, or rhonchi. Breathing is unlabored. Heart: RRR with S1 S2. No murmurs, rubs, or gallops appreciated. Abdomen: Soft, non-tender, non-distended with normoactive bowel sounds. No hepatomegaly. No rebound/guarding. No obvious abdominal masses. Msk:  Strength and tone appear normal for  age. Extremities: No clubbing or cyanosis. S/p bilateral BKA Neuro: Alert and oriented X 3. No facial asymmetry. No focal deficit. Moves all extremities spontaneously. Psych:  Responds to questions appropriately with a normal affect.     Assessment and Plan  3F with ESRD on HD, DM, HTN, PVD s/p bilateral BKA, CAD (not a CABG candidate, s/p DES to Cx 19/1478), chronic systolic CHF (EF 29-56%), severe aortic regurgitation, moderate to severe mitral regurgitation, elevated PA pressure, anemia of CKD, HLD, h/o esophagitis presented to [facility] with atypical chest pain.  1. Atypical chest pain 2. CAD as above 3. Chronic systolic CHF 4. ESRD on HD 5. Dysphagia with history of esophagitis/gastritis 6. HTN with history of hypotension at HD 7. Valvular heart disease  Patient seen/examined with Dr. Meda Coffee. Chest pain is atypical. Some component of dysphagia as well with mild cough. CXR at PCP was unremarkable. She states her Protonix was not refilled so she has not been taking it. Have asked ED to give a dose in ER and will send in refills for 40mg  BID. She certainly has substrate for angina given her severe CAD and valvular disease but this particular episode did not sound anginal in nature. Troponin is flat and downtrending from last week, likely taking longer to clear given renal insufficiency. Would recommend to keep close OP f/u - has appt 07/16/16. Patient was instructed to notify our office if symptoms recur or worsen. Antianginals not added at this time given atypical sx and h/o BP dropping in dialysis, but something to consider (perhaps on nondialysis days) going forward.   Signed, Charlie Pitter PA-C 07/07/2016, 8:10 PM Pager: [phone]  The patient was seen, examined and discussed with Melina Copa, PA-C and I agree with the above.   3F with ESRD on HD,  DM, HTN, PVD s/p bilateral BKA, CAD, NSTEMI on 10/30 with 3VD (not a CABG candidate, s/p DES to Cx 77/4128), chronic systolic CHF (EF 78-67%),  severe aortic regurgitation, moderate to severe mitral regurgitation, moderately elevated PA pressure, anemia of CKD, HLD, h/o esophagitis presented to [facility] with atypical chest pain. Troponin on 10/30 0.76, troponin today 0.56 --> 0.49. The patient had an episode of chest pain that lasted 1 minute, was sharp, currently chest pain free. She also feels that she has food stuck in her mid chest while eating, she had an EGD in July 2017 with findings of distal esophageal stricture, esophagitis, LA grade C ulcerative reflux type esophagitis with surrounding edema and Mild to moderate distal gastritis, started on pantoprazole that she ran of. We will restart, send prescription to her pharmacy. She can be discharged from the ER, she has a follow up arranged on 11/16 with Ignacia Bayley.   Ena Dawley 07/07/2016

## 2016-07-07 NOTE — ED Notes (Signed)
ER MD made aware of pt's troponin

## 2016-07-07 NOTE — ED Notes (Signed)
MD at bedside. 

## 2016-07-07 NOTE — Patient Instructions (Addendum)
Please continue all other medications as before, including finishing the levaquin  Please have the pharmacy call with any other refills you may need.  Please keep your appointments with your specialists as you may have planned  Your EKG was OK today  Please go to the XRAY Department in the Basement (go straight as you get off the elevator) for the x-ray testing  Please go to the LAB in the Basement (turn left off the elevator) for the tests to be done today  You will be contacted by phone if any changes need to be made immediately.  Otherwise, you will receive a letter about your results with an explanation, but please check with MyChart first.  Please remember to sign up for MyChart if you have not done so, as this will be important to you in the future with finding out test results, communicating by private email, and scheduling acute appointments online when needed.  Please go to the ER for any recurring Chest Pain  Please call Dr Gwenlyn Found office for follow up as you mentioned you were asked to do.

## 2016-07-07 NOTE — ED Provider Notes (Addendum)
[facility] DEPT Provider Note   CSN: [id number] Arrival date & time: 07/07/16  1447     History   Chief Complaint Chief Complaint  Patient presents with  . Chest Pain  . Shortness of Breath    HPI Carolyn Valenzuela is a 64 y.o. female.  Patient presents referred by her PCP where she was seen for sharp substernal chest pain that started this morning at rest, worsened with walking. She has had some shortness of breath and reports she has had shortness of breath since she was recently discharged for getting a PCI several days ago. She reports that she has been compliant on her medications though is unsure what these medications are. She gets MWF dialysis and did get her dialysis yesterday. Denies chest pain on arral here. Was seen by her PCP today who drew a troponin and found that it was elevated.   The history is provided by the patient and medical records. No language interpreter was used.  Chest Pain   Associated symptoms include shortness of breath.  Shortness of Breath  Associated symptoms include chest pain.    Past Medical History:  Diagnosis Date  . Allergic rhinitis, cause unspecified 12/08/2010  . ANEMIA-IRON DEFICIENCY 01/25/2008  . Aortic regurgitation   . CAD (coronary artery disease)    a. NSTEMI 07/2016 - not felt to be a CABG candidate, s/p PCI/DES of LCx.  . CERVICAL RADICULOPATHY, LEFT 01/25/2008  . Chronic systolic CHF (congestive heart failure) ([place])   . Critical lower limb ischemia   . DIABETES MELLITUS, UNCONTROLLED 05/21/2009  . Esophagitis   . ESRD on dialysis ([place])   . Foot ulcer ([place])    right lateral malleolus  . HCAP (healthcare-associated pneumonia) 06/29/2016  . HYPERLIPIDEMIA 03/30/2007  . HYPERTENSION 01/25/2008  . Mitral regurgitation   . PVD (peripheral vascular disease) ([place])    a. s/p BKA  . SECONDARY HYPERPARATHYROIDISM 05/21/2009    Patient Active Problem List   Diagnosis Date Noted  . Abnormal CXR 07/07/2016  . Malnutrition of  moderate degree 07/03/2016  . Hypotension   . NSTEMI (non-ST elevated myocardial infarction) ([place])   . Elevated troponin   . Nausea and vomiting 03/18/2016  . Sepsis ([place]) due to UTI 12/20/2015  . Acute lower UTI   . Labile blood pressure   . Hyperglycemia   . SIRS (systemic inflammatory response syndrome) (HCC)   . Fever   . Hypoglycemia associated with diabetes ([place])   . Leukocytosis   . Urinary retention   . Dysphagia   . Unilateral complete BKA ([place]) 12/04/2015  . S/P bilateral BKA (below knee amputation) ([place])   . Abnormality of gait   . Muscle spasm   . ESRD on dialysis ([place])   . Type 2 diabetes mellitus with diabetic peripheral angiopathy and gangrene, without long-term current use of insulin ([place])   . Post-operative pain   . Benign essential HTN   . Metabolic bone disease   . Hypoxia   . Type 2 diabetes mellitus with peripheral neuropathy (HCC)   . Labile blood glucose   . ESRD (end stage renal disease) ([place])   . Postoperative pain   . Acute blood loss anemia   . Anemia of chronic disease   . Below knee amputation status ([place]) 11/29/2015  . Odynophagia 11/12/2015  . Chest pain, atypical 06/06/2015  . Chest pain 05/28/2015  . Ingrown nail 05/28/2015  . Acute pulmonary edema (HCC)   . FUO (fever of unknown origin)   .  HCAP (healthcare-associated pneumonia)   . ESRD needing dialysis ([place])   . Type 2 diabetes mellitus with ESRD (end-stage renal disease) ([place]) 09/01/2014  . Anemia of renal disease 09/01/2014  . Venous insufficiency of left leg 08/27/2014  . Rash 07/06/2014  . S/P BKA (below knee amputation) ([place]) 03/20/2014  . 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 Critical lower limb ischemia 01/09/2014  . Right foot ulcer ([place]) 11/07/2013  . Toe pain 09/06/2013  . Pre-ulcerative corn or callous 09/06/2013  . Lower extremity pain 09/06/2013  . Left low back pain 12/08/2011  . Hip bursitis, left 12/08/2011  . Left leg pain 12/08/2011  .  Abdominal pain, other specified site 07/08/2011  . Preventative health care 12/08/2010  . Allergic rhinitis, cause unspecified 12/08/2010  . SECONDARY HYPERPARATHYROIDISM 05/21/2009  . NUMBNESS 05/21/2009  . BACK PAIN 05/02/2009  . ANEMIA-IRON DEFICIENCY 01/25/2008  . Essential hypertension 01/25/2008  . CERVICAL RADICULOPATHY, LEFT 01/25/2008  . Hyperlipidemia 03/30/2007    Past Surgical History:  Procedure Laterality Date  . AMPUTATION Right 03/15/2014   Procedure: RIGHT  LEG  BELOW KNEE AMPUTATION ;  Surgeon: [name], MD;  Location: [facility];  Service: Orthopedics;  Laterality: Right;  . AMPUTATION Left 11/29/2015   Procedure: AMPUTATION BELOW KNEE;  Surgeon: Newt Minion, MD;  Location: [facility];  Service: Orthopedics;  Laterality: Left;  . AV FISTULA PLACEMENT Left   . AV FISTULA PLACEMENT Left 09/06/2014   Procedure: ARTERIOVENOUS (AV) FISTULA CREATION-Left Brachiocephalic;  Surgeon: Conrad Wintersburg, MD;  Location: [facility];  Service: Vascular;  Laterality: Left;  . CARDIAC CATHETERIZATION N/A 06/30/2016   Procedure: Left Heart Cath and Coronary Angiography;  Surgeon: Burnell Blanks, MD;  Location: [facility];  Service: Cardiovascular;  Laterality: N/A;  . CARDIAC CATHETERIZATION N/A 07/02/2016   Procedure: Coronary Stent Intervention;  Surgeon: Nelva Bush, MD;  Location: [facility];  Service: Cardiovascular;  Laterality: N/A;  . COLONOSCOPY    . CORONARY ANGIOPLASTY WITH STENT PLACEMENT  07/02/2016  . ESOPHAGOGASTRODUODENOSCOPY N/A 03/20/2016   Procedure: ESOPHAGOGASTRODUODENOSCOPY (EGD);  Surgeon: Milus Banister, MD;  Location: [facility];  Service: Endoscopy;  Laterality: N/A;  . EYE SURGERY Bilateral    cataracts removed, left eye still has some oil in it.  . INSERTION OF DIALYSIS CATHETER N/A 09/03/2014   Procedure: INSERTION OF DIALYSIS CATHETER RIGHT INTERNAL JUGULAR VEIN;  Surgeon: Conrad Rohrersville, MD;  Location: [facility];  Service: Vascular;  Laterality: N/A;   . PERIPHERAL VASCULAR CATHETERIZATION N/A 08/13/2015   Procedure: Abdominal Aortogram;  Surgeon: Elam Dutch, MD;  Location: [facility];  Service: Cardiovascular;  Laterality: N/A;    OB History    No data available       Home Medications    Prior to Admission medications   Medication Sig Start Date End Date Taking? Authorizing Provider  aspirin 81 MG EC tablet Take 81 mg by mouth daily.     Yes Historical Provider, MD  atorvastatin (LIPITOR) 80 MG tablet Take 1 tablet (80 mg total) by mouth daily at 6 PM. 07/04/16  Yes Bonnielee Haff, MD  clopidogrel (PLAVIX) 75 MG tablet Take 1 tablet (75 mg total) by mouth daily. 07/04/16  Yes Bonnielee Haff, MD  [employer] ACID REDUCER 10 MG tablet TAKE 1 TABLET (10 MG TOTAL) BY MOUTH DAILY. 03/13/16  Yes Ivan Anchors Love, PA-C  gabapentin (NEURONTIN) 100 MG capsule Take 1 capsule by mouth 2 (two)  times daily. 10/29/15  Yes Historical Provider, MD  glipiZIDE (GLUCOTROL XL) 10 MG 24 hr tablet Take 1 tablet (10 mg total) by mouth daily with breakfast. 03/12/16  Yes Biagio Borg, MD  glucose blood (ONETOUCH VERIO) test strip 1 each by Other route 2 (two) times daily. Use to check blood sugars twice a day Dx E11.9 01/09/16  Yes Biagio Borg, MD  levofloxacin (LEVAQUIN) 500 MG tablet Take 1 tablet (500 mg total) by mouth every other day. For 4 more doses 07/05/16  Yes Bonnielee Haff, MD  multivitamin (RENA-VIT) TABS tablet Take 1 tablet by mouth at bedtime. 07/04/16  Yes Bonnielee Haff, MD  [facility] DELICA LANCETS FINE MISC Use to help check blood sugars twice a day Dx e11.9 01/09/16  Yes Biagio Borg, MD  sevelamer carbonate (RENVELA) 800 MG tablet Take 1,600 mg by mouth 3 (three) times daily with meals.   Yes Historical Provider, MD  traZODone (DESYREL) 50 MG tablet TAKE 0.5-1 TABLETS (25-50 MG TOTAL) BY MOUTH AT BEDTIME AS NEEDED FOR SLEEP. 02/18/16  Yes Tenlee Wollin Lorie Phenix, MD  acetaminophen (TYLENOL) 325 MG tablet Take 2 tablets (650 mg total) by mouth every  6 (six) hours as needed for mild pain, moderate pain or fever. 09/13/14   Modena Jansky, MD  pantoprazole (PROTONIX) 40 MG tablet Take 1 tablet (40 mg total) by mouth 2 (two) times daily before a meal. 07/07/16   Dayna N Dunn, PA-C  senna (SENOKOT) 8.6 MG TABS tablet Take 2 tablets (17.2 mg total) by mouth 2 (two) times daily before a meal. Patient taking differently: Take 2 tablets by mouth daily as needed for mild constipation.  12/20/15   Bary Leriche, PA-C    Family History Family History  Problem Relation Age of Onset  . Cancer Mother     Pancreatic    Social History Social History  Substance Use Topics  . Smoking status: Never Smoker  . Smokeless tobacco: Never Used  . Alcohol use No     Allergies   Oxycodone   Review of Systems Review of Systems  Respiratory: Positive for shortness of breath.   Cardiovascular: Positive for chest pain.  10 Systems reviewed and are negative for acute change except as noted in the HPI.    Physical Exam Updated Vital Signs BP 135/68   Pulse 80   Temp 98.2 F (36.8 C) (Oral)   Resp 19   Wt 51.7 kg   SpO2 100%   BMI 24.67 kg/m   Physical Exam  Constitutional: She is oriented to person, place, and time. She appears well-developed and well-nourished.  HENT:  Head: Normocephalic and atraumatic.  Eyes: EOM are normal. Pupils are equal, round, and reactive to light.  Neck: Neck supple.  Cardiovascular: Normal rate and intact distal pulses.   Pulmonary/Chest: Effort normal. No respiratory distress.  Abdominal: Soft. She exhibits no distension. There is no tenderness. There is no rebound and no guarding.  Neurological: She is alert and oriented to person, place, and time.  Skin: Skin is warm and dry.  Nursing note and vitals reviewed.    ED Treatments / Results  Labs (all labs ordered are listed, but only abnormal results are displayed) Labs Reviewed  BASIC METABOLIC PANEL - Abnormal; Notable for the following:        Result Value   Potassium 3.4 (*)    Chloride 94 (*)    Glucose, Bld 282 (*)    Creatinine, Ser 5.94 (*)  GFR calc non Af Amer 7 (*)    GFR calc Af Amer 8 (*)    All other components within normal limits  CBC - Abnormal; Notable for the following:    RBC 3.42 (*)    Hemoglobin 10.0 (*)    HCT 31.5 (*)    RDW 19.1 (*)    All other components within normal limits  TROPONIN I - Abnormal; Notable for the following:    Troponin I 0.50 (*)    All other components within normal limits  TROPONIN I - Abnormal; Notable for the following:    Troponin I 0.49 (*)    All other components within normal limits  I-STAT TROPOININ, ED - Abnormal; Notable for the following:    Troponin i, poc 0.56 (*)    All other components within normal limits  I-STAT CHEM 8, ED - Abnormal; Notable for the following:    Potassium 3.4 (*)    Chloride 93 (*)    BUN 23 (*)    Creatinine, Ser 5.60 (*)    Glucose, Bld 280 (*)    Calcium, Ion 1.06 (*)    Hemoglobin 10.9 (*)    HCT 32.0 (*)    All other components within normal limits    EKG  EKG Interpretation  Date/Time:  Tuesday July 07 2016 14:56:19 EST Ventricular Rate:  81 PR Interval:  196 QRS Duration: 90 QT Interval:  402 QTC Calculation: 466 R Axis:   33 Text Interpretation:  Normal sinus rhythm Nonspecific T wave abnormality Abnormal ECG No acute changes mild ST depression in the lateral leads Confirmed by Kathrynn Humble, MD, Thelma Comp [phone]) on 07/07/2016 5:38:47 PM       Radiology Dg Chest 2 View  Result Date: 07/07/2016 CLINICAL DATA:  65 y/o F; sharp chest pain. Stent placed last week. EXAM: CHEST  2 VIEW COMPARISON:  07/03/2016 chest radiating FINDINGS: Stable borderline cardiomegaly. Linear left basilar opacities probably represent atelectasis or scarring. No focal consolidation. No effusion or pneumothorax. Mild degenerative changes of the thoracic spine. IMPRESSION: Stable borderline cardiomegaly.  No acute pulmonary process. Electronically  Signed   By: Kristine Garbe M.D.   On: 07/07/2016 15:57    Procedures Procedures (including critical care time)  Medications Ordered in ED Medications  pantoprazole (PROTONIX) EC tablet 40 mg (40 mg Oral Given 07/07/16 2031)     Initial Impression / Assessment and Plan / ED Course  I have reviewed the triage vital signs and the nursing notes.  Pertinent labs & imaging results that were available during my care of the patient were reviewed by me and considered in my medical decision making (see chart for details).  Clinical Course     Patient presents with chest pain this morning, history of severe CAD with recent PCI around one week ago. Sent from her PCP for elevated troponin. She is well-appearing on arrival with normal vital signs. Normal work of breathing and normal oxygen saturation on room air. EKG is similar to prior with no acute ischemic changes or arrhythmia. She has no chest pain on arrival. CXR from today with stable cardiomegaly, no signs of pneumonia or pulmonary infiltrate. Do not suspect PE given that she has no hypoxia and is breathing comfortably now, and the chest pain does not seem pleuritic in nature. Second troponin was improved with a value of 0.5 from 0.66. Cardiology consulted and feel her chest pain is atypical and that she does not require admission. Third troponin continues to downtrend. She was  given a dose of protonix given known history of esophagitis, and was prescribed this to go home. She was given strict return precautions for worsening symptoms and expressed understanding of this. Cardiology has arranged follow up. She is in good condition for discharge home.  Final Clinical Impressions(s) / ED Diagnoses   Final diagnoses:  Atypical chest pain    New Prescriptions Discharge Medication List as of 07/07/2016  8:22 PM       Harlin Heys, MD 07/07/16 [id number]    Varney Biles, MD 07/09/16 [place], MD 09/20/16 1625

## 2016-07-07 NOTE — ED Notes (Signed)
Patient transported to x-ray. ?

## 2016-07-07 NOTE — ED Triage Notes (Signed)
Pt states she went to PCP for a checkup, and she had chest pain this morning and SOB. Pt states she had a stent placed Wednesday, she has three blockages. She got a phone call from her PCP scine they did lab work and was told to come here. Denies CP right now. States she feels a little SOB.

## 2016-07-07 NOTE — Telephone Encounter (Signed)
CRITICAL LAB- Troponin 0.66

## 2016-07-07 NOTE — Assessment & Plan Note (Signed)
Oct 31 film c/w LLL A/I - ? Pna, encouraged to finish levaquin, and f/u cxr today, doubt volume overload by exam now s/p HD yesterday but cxr should help as well

## 2016-07-07 NOTE — Progress Notes (Signed)
Pre visit review using our clinic review tool, if applicable. No additional management support is needed unless otherwise documented below in the visit note. 

## 2016-07-07 NOTE — Assessment & Plan Note (Signed)
stable overall by history and exam, recent data reviewed with pt, and pt to continue medical treatment as before,  to f/u any worsening symptoms or concerns BP Readings from Last 3 Encounters:  07/07/16 140/76  07/04/16 (!) 130/50  03/20/16 (!) 169/68

## 2016-07-07 NOTE — Assessment & Plan Note (Addendum)
Pt with atypical 1 min pain this am, etiology unclear, ecg reviewed with pt, exam o/w benign except for general weakness and possibly residual cough; no pain recurrence for several hours, but not sure really then pain occurred this am; ideally pt would be best served at ER with immediate access to cxr/lab evaluation as well but she declines for now, so will check cxr, labs including d-dimer, cpk/mb and troponin.  Pt does not appear to be volume overloaded at this time  Note:  Total time for pt hx, exam, review of record with pt in the room, determination of diagnoses and plan for further eval and tx is > 40 min, with over 50% spent in coordination and counseling of patient

## 2016-07-07 NOTE — ED Triage Notes (Signed)
In reviewing labs from office, pt troponin was elevated.

## 2016-07-07 NOTE — Progress Notes (Signed)
Subjective:    Patient ID: Carolyn Valenzuela, female    DOB: [birth year], 65 y.o.   MRN: [id number]  HPI  65 yo AAF here with friend, able to give history but details somewhat hazy even to pt from this AM, with ESRD (last HD yesterday), DM, HTN, PVD, and recent hospn with symptomatic CAD severe dz by cath now s/p PCI to circumflex, as well as tx for possible LLL HCAP with 2 levaquin left to take, with hx of recurring CP and recurring dyspnea/DOE improved since d/c Nov 4 post cath and volume control with HD yesterday; today c/o 1 minute low to mid/slightly left sided SSCP, sharp, pleuritic at the time and not assoc with radiation, n/v, palp, worse SOB or dizziness.  None since that time.  Remains weak though can ambulate slowly with walker this am without assist.  No falls, No fever, Has some occas nonprod cough, but maybe better last few days.  No hx of DVT/PE, also s/p LLE BKA.  Denies worsening fever, chills.  Only other complaint is of bilat periscapular fleeting pains as well with moving around, using walker.   Refuses ER referral now, though might consider if pain recurs.  Was asked to f/u with appt with Dr Gwenlyn Found as outpt but has not been able to do this yet. Past Medical History:  Diagnosis Date  . Allergic rhinitis, cause unspecified 12/08/2010  . ANEMIA-IRON DEFICIENCY 01/25/2008  . BACK PAIN 05/02/2009  . CERVICAL RADICULOPATHY, LEFT 01/25/2008  . Critical lower limb ischemia   . DIABETES MELLITUS, UNCONTROLLED 05/21/2009  . Foot ulcer ([place])    right lateral malleolus  . HCAP (healthcare-associated pneumonia) 06/29/2016  . HYPERLIPIDEMIA 03/30/2007  . HYPERTENSION 01/25/2008  . NUMBNESS 05/21/2009  . RENAL INSUFFICIENCY 02/01/2008   HD pt.  . SECONDARY HYPERPARATHYROIDISM 05/21/2009  . SINUSITIS- ACUTE-NOS 01/25/2008   Past Surgical History:  Procedure Laterality Date  . AMPUTATION Right 03/15/2014   Procedure: RIGHT  LEG  BELOW KNEE AMPUTATION ;  Surgeon: [name], MD;  Location: [facility];   Service: Orthopedics;  Laterality: Right;  . AMPUTATION Left 11/29/2015   Procedure: AMPUTATION BELOW KNEE;  Surgeon: Newt Minion, MD;  Location: [facility];  Service: Orthopedics;  Laterality: Left;  . AV FISTULA PLACEMENT Left   . AV FISTULA PLACEMENT Left 09/06/2014   Procedure: ARTERIOVENOUS (AV) FISTULA CREATION-Left Brachiocephalic;  Surgeon: Conrad Badin, MD;  Location: [facility];  Service: Vascular;  Laterality: Left;  . CARDIAC CATHETERIZATION N/A 06/30/2016   Procedure: Left Heart Cath and Coronary Angiography;  Surgeon: Burnell Blanks, MD;  Location: [facility];  Service: Cardiovascular;  Laterality: N/A;  . CARDIAC CATHETERIZATION N/A 07/02/2016   Procedure: Coronary Stent Intervention;  Surgeon: Nelva Bush, MD;  Location: [facility];  Service: Cardiovascular;  Laterality: N/A;  . COLONOSCOPY    . CORONARY ANGIOPLASTY WITH STENT PLACEMENT  07/02/2016  . ESOPHAGOGASTRODUODENOSCOPY N/A 03/20/2016   Procedure: ESOPHAGOGASTRODUODENOSCOPY (EGD);  Surgeon: Milus Banister, MD;  Location: [facility];  Service: Endoscopy;  Laterality: N/A;  . EYE SURGERY Bilateral    cataracts removed, left eye still has some oil in it.  . INSERTION OF DIALYSIS CATHETER N/A 09/03/2014   Procedure: INSERTION OF DIALYSIS CATHETER RIGHT INTERNAL JUGULAR VEIN;  Surgeon: Conrad Bryan, MD;  Location: [facility];  Service: Vascular;  Laterality: N/A;  . PERIPHERAL VASCULAR CATHETERIZATION N/A 08/13/2015   Procedure: Abdominal Aortogram;  Surgeon: Elam Dutch, MD;  Location: [facility];  Service: Cardiovascular;  Laterality: N/A;    reports that she has never smoked. She has never used smokeless tobacco. She reports that she does not drink alcohol or use drugs. family history includes Cancer in her mother. Allergies  Allergen Reactions  . Oxycodone Itching   Current Outpatient Prescriptions on File Prior to Visit  Medication Sig Dispense Refill  . acetaminophen (TYLENOL) 325 MG  tablet Take 2 tablets (650 mg total) by mouth every 6 (six) hours as needed for mild pain, moderate pain or fever.    [name] aspirin 81 MG EC tablet Take 81 mg by mouth daily.      [name] atorvastatin (LIPITOR) 80 MG tablet Take 1 tablet (80 mg total) by mouth daily at 6 PM. 30 tablet 2  . clopidogrel (PLAVIX) 75 MG tablet Take 1 tablet (75 mg total) by mouth daily. 30 tablet 2  . [employer] ACID REDUCER 10 MG tablet TAKE 1 TABLET (10 MG TOTAL) BY MOUTH DAILY. 30 tablet 1  . gabapentin (NEURONTIN) 100 MG capsule Take 1 capsule by mouth 2 (two) times daily.  3  . glipiZIDE (GLUCOTROL XL) 10 MG 24 hr tablet Take 1 tablet (10 mg total) by mouth daily with breakfast. 90 tablet 3  . glucose blood (ONETOUCH VERIO) test strip 1 each by Other route 2 (two) times daily. Use to check blood sugars twice a day Dx E11.9 100 each 3  . levofloxacin (LEVAQUIN) 500 MG tablet Take 1 tablet (500 mg total) by mouth every other day. For 4 more doses 4 tablet 0  . multivitamin (RENA-VIT) TABS tablet Take 1 tablet by mouth at bedtime. 30 tablet 0  . ONETOUCH DELICA LANCETS FINE MISC Use to help check blood sugars twice a day Dx e11.9 100 each 3  . pantoprazole (PROTONIX) 40 MG tablet Take 1 tablet (40 mg total) by mouth 2 (two) times daily before a meal. 60 tablet 1  . senna (SENOKOT) 8.6 MG TABS tablet Take 2 tablets (17.2 mg total) by mouth 2 (two) times daily before a meal. (Patient taking differently: Take 2 tablets by mouth daily as needed for mild constipation. ) 120 each 1  . sevelamer carbonate (RENVELA) 800 MG tablet Take 1,600 mg by mouth 3 (three) times daily with meals.    . traZODone (DESYREL) 50 MG tablet TAKE 0.5-1 TABLETS (25-50 MG TOTAL) BY MOUTH AT BEDTIME AS NEEDED FOR SLEEP. 30 tablet 1   No current facility-administered medications on file prior to visit.    Review of Systems  Constitutional: Negative for unusual diaphoresis or night sweats HENT: Negative for ear swelling or discharge Eyes: Negative for  worsening visual haziness  Respiratory: Negative for choking and stridor.   Gastrointestinal: Negative for distension or worsening eructation Genitourinary: Negative for retention or change in urine volume.  Musculoskeletal: Negative for other MSK pain or swelling Skin: Negative for color change and worsening wound Neurological: Negative for tremors and numbness other than noted  Psychiatric/Behavioral: Negative for decreased concentration or agitation other than above   All other system neg per pt    Objective:   Physical Exam BP 140/76   Pulse 90   Temp 98.4 F (36.9 C) (Oral)   Resp 20   Wt 114 lb (51.7 kg)   BMI 24.67 kg/m   VS noted, fatigue and generally weak but nontoxic, alert, oriented Constitutional: Pt appears in no apparent distress HENT: Head: NCAT.  Right Ear: External ear normal.  Left Ear: External ear normal.  Eyes: . Pupils are equal, round, and reactive to light. Conjunctivae and EOM are normal Neck: Normal range of motion. Neck supple.  Cardiovascular: Normal rate and regular rhythm.   Pulmonary/Chest: Effort normal and breath sounds decreased without rales or wheezing.  - benign exam Abd:  Soft, NT, ND, + BS Neurological: Pt is alert. Not confused , motor grossly intact Skin: Skin is warm. No rash, no LE edema, s/p left BKA Psychiatric: Pt behavior is normal. No agitation.   Today ECG I personally interpreted and agree: Sinus  Rhythm  -Poor R-wave progression -nonspecific -consider old anterior infarct.   -  Nonspecific T-abnormality.           Assessment & Plan:

## 2016-07-07 NOTE — ED Triage Notes (Signed)
Pt went Monday for dialysis.

## 2016-07-07 NOTE — ED Notes (Signed)
Gave pt a [place] sandwich and a Ginger Ale.

## 2016-07-16 ENCOUNTER — Ambulatory Visit (INDEPENDENT_AMBULATORY_CARE_PROVIDER_SITE_OTHER): Payer: Medicare Other | Admitting: Nurse Practitioner

## 2016-07-16 ENCOUNTER — Encounter: Payer: Self-pay | Admitting: Nurse Practitioner

## 2016-07-16 VITALS — BP 130/48 | HR 77 | Ht <= 58 in | Wt 120.8 lb

## 2016-07-16 DIAGNOSIS — I214 Non-ST elevation (NSTEMI) myocardial infarction: Secondary | ICD-10-CM | POA: Diagnosis not present

## 2016-07-16 DIAGNOSIS — E785 Hyperlipidemia, unspecified: Secondary | ICD-10-CM | POA: Diagnosis not present

## 2016-07-16 DIAGNOSIS — I5022 Chronic systolic (congestive) heart failure: Secondary | ICD-10-CM

## 2016-07-16 DIAGNOSIS — I25110 Atherosclerotic heart disease of native coronary artery with unstable angina pectoris: Secondary | ICD-10-CM | POA: Diagnosis not present

## 2016-07-16 DIAGNOSIS — I255 Ischemic cardiomyopathy: Secondary | ICD-10-CM

## 2016-07-16 DIAGNOSIS — I340 Nonrheumatic mitral (valve) insufficiency: Secondary | ICD-10-CM

## 2016-07-16 DIAGNOSIS — I351 Nonrheumatic aortic (valve) insufficiency: Secondary | ICD-10-CM

## 2016-07-16 DIAGNOSIS — I10 Essential (primary) hypertension: Secondary | ICD-10-CM | POA: Diagnosis not present

## 2016-07-16 NOTE — Patient Instructions (Signed)
Your physician recommends that you return for lab work in 9 month - FASTING.  Carolyn Bayley, NP recommends that you schedule a follow-up appointment in 2 months with Dr Gwenlyn Found.  If you need a refill on your cardiac medications before your next appointment, please call your pharmacy.

## 2016-07-16 NOTE — Progress Notes (Signed)
Office Visit    Patient Name: Carolyn Valenzuela Date of Encounter: 07/16/2016  Primary Care Provider:  Cathlean Cower, MD Primary Cardiologist:  Adora Fridge, MD   Chief Complaint    65 year old female with a prior history of end-stage renal disease, diabetes, hypertension, hyperlipidemia, and peripheral vascular disease status post BKA, who presents after recent non-STEMI and percutaneous intervention.  Past Medical History    Past Medical History:  Diagnosis Date  . Allergic rhinitis, cause unspecified 12/08/2010  . ANEMIA-IRON DEFICIENCY 01/25/2008  . Aortic regurgitation   . CAD (coronary artery disease)    a. 07/2016 NSTEMI/PCI: LM nl, LAD 70ost, 29m/d, 80d, D1 40ost, LCX 99ost/p (2.5x16 Synergy DES - 2.75), 40/36m, Om2 100 CTO, RCA  30p/m. Pt eval by CT surgery, not felt to be suitable candidate 2/2 comorbidities.  . CERVICAL RADICULOPATHY, LEFT 01/25/2008  . Chronic systolic CHF (congestive heart failure) ([place])    a. 07/2016 Echo: EF 40-45%, diff HK, sev AI, mild MVP w/ mod to sev MR, PASP 70mmHg.  . Critical lower limb ischemia   . DIABETES MELLITUS, UNCONTROLLED 05/21/2009  . Esophagitis   . ESRD on dialysis ([place])   . Foot ulcer ([place])    right lateral malleolus  . HCAP (healthcare-associated pneumonia) 06/29/2016  . HYPERLIPIDEMIA 03/30/2007  . HYPERTENSION 01/25/2008  . Ischemic cardiomyopathy    a. 07/2016 Echo: EF 40-45%.  . Mitral regurgitation    a. 07/2016 Echo: Mild MVP w/ mod to sev MR.  [name] PVD (peripheral vascular disease) ([place])    a. s/p bilat BKA  . SECONDARY HYPERPARATHYROIDISM 05/21/2009  . Severe aortic regurgitation    a. 07/2016 Echo: Severe AI.   Past Surgical History:  Procedure Laterality Date  . AMPUTATION Right 03/15/2014   Procedure: RIGHT  LEG  BELOW KNEE AMPUTATION ;  Surgeon: [name], MD;  Location: [place];  Service: Orthopedics;  Laterality: Right;  . AMPUTATION Left 11/29/2015   Procedure: AMPUTATION BELOW KNEE;  Surgeon: Newt Minion, MD;   Location: [place];  Service: Orthopedics;  Laterality: Left;  . AV FISTULA PLACEMENT Left   . AV FISTULA PLACEMENT Left 09/06/2014   Procedure: ARTERIOVENOUS (AV) FISTULA CREATION-Left Brachiocephalic;  Surgeon: Conrad Clearmont, MD;  Location: [facility];  Service: Vascular;  Laterality: Left;  . CARDIAC CATHETERIZATION N/A 06/30/2016   Procedure: Left Heart Cath and Coronary Angiography;  Surgeon: Burnell Blanks, MD;  Location: [place];  Service: Cardiovascular;  Laterality: N/A;  . CARDIAC CATHETERIZATION N/A 07/02/2016   Procedure: Coronary Stent Intervention;  Surgeon: Nelva Bush, MD;  Location: [place] [facility];  Service: Cardiovascular;  Laterality: N/A;  . COLONOSCOPY    . CORONARY ANGIOPLASTY WITH STENT PLACEMENT  07/02/2016  . ESOPHAGOGASTRODUODENOSCOPY N/A 03/20/2016   Procedure: ESOPHAGOGASTRODUODENOSCOPY (EGD);  Surgeon: Milus Banister, MD;  Location: [place];  Service: Endoscopy;  Laterality: N/A;  . EYE SURGERY Bilateral    cataracts removed, left eye still has some oil in it.  . INSERTION OF DIALYSIS CATHETER N/A 09/03/2014   Procedure: INSERTION OF DIALYSIS CATHETER RIGHT INTERNAL JUGULAR VEIN;  Surgeon: Conrad , MD;  Location: [facility];  Service: Vascular;  Laterality: N/A;  . PERIPHERAL VASCULAR CATHETERIZATION N/A 08/13/2015   Procedure: Abdominal Aortogram;  Surgeon: Elam Dutch, MD;  Location: [place] [facility];  Service: Cardiovascular;  Laterality: N/A;    Allergies  Allergies  Allergen Reactions  . Oxycodone Itching    History of Present Illness  65 year old female with the above complex past medical history including end-stage renal disease on dialysis, hypertension, hyperlipidemia, diabetes, and peripheral vascular disease status post bilateral BKA. She was recently hospitalized at [facility] with chest pain and elevated troponin. She underwent diagnostic catheterization revealing severe multivessel disease though the culprit was felt  to be the left circumflex, which had a 99% ostial and proximal stenosis. Initially, films were reviewed and agree thoracic surgery was consulted. She also was noted to have severe aortic insufficiency with moderate to severe mitral regurgitation on echo. Due to comorbidities and frailty, she was not considered to be a suitable surgical candidate and pertinent is intervention was recommended. She was taken back to the catheterization laboratory and underwent successful PCI and drug-eluting stent placement within the left circumflex. She tolerated the procedure well and was going discharged. She was not able to be placed on beta blocker or ACE inhibitor therapy secondary to relative hypotension and also history of renal disease. She presented back to the emergency department a few days later following an episode of sharp chest pain. Her troponin was initially mildly elevated but subsequent troponin was on its way down. Was felt this elevation was likely secondary to prior event. Symptoms were worse with swallowing and EGD findings from July were reviewed and showed distal esophageal stricture with esophagitis and grade C ulcerative reflux type esophagitis. She had been off of Protonix, and this was resumed. She was subsequently discharged from the ED. Since that visit, she has done well with no recurrence of chest pain or dyspnea. She is tolerating her medications and has been compliant. She does note that blood pressures run low on dialysis even though she's not taking anything for blood pressure at this point. Her bowels have been loose and upon discussing it, she now realizes that she's been taking 2 tablets of senna twice a day.  Home Medications    Prior to Admission medications   Medication Sig Start Date End Date Taking? Authorizing Provider  acetaminophen (TYLENOL) 325 MG tablet Take 2 tablets (650 mg total) by mouth every 6 (six) hours as needed for mild pain, moderate pain or fever. 09/13/14  Yes Modena Jansky, MD  aspirin 81 MG EC tablet Take 81 mg by mouth daily.     Yes Historical Provider, MD  atorvastatin (LIPITOR) 80 MG tablet Take 1 tablet (80 mg total) by mouth daily at 6 PM. 07/04/16  Yes Bonnielee Haff, MD  clopidogrel (PLAVIX) 75 MG tablet Take 1 tablet (75 mg total) by mouth daily. 07/04/16  Yes Bonnielee Haff, MD  [employer] ACID REDUCER 10 MG tablet TAKE 1 TABLET (10 MG TOTAL) BY MOUTH DAILY. 03/13/16  Yes Ivan Anchors Love, PA-C  gabapentin (NEURONTIN) 100 MG capsule Take 1 capsule by mouth 2 (two) times daily. 10/29/15  Yes Historical Provider, MD  glipiZIDE (GLUCOTROL XL) 10 MG 24 hr tablet Take 1 tablet (10 mg total) by mouth daily with breakfast. 03/12/16  Yes Biagio Borg, MD  glucose blood (ONETOUCH VERIO) test strip 1 each by Other route 2 (two) times daily. Use to check blood sugars twice a day Dx E11.9 01/09/16  Yes Biagio Borg, MD  levofloxacin (LEVAQUIN) 500 MG tablet Take 1 tablet (500 mg total) by mouth every other day. For 4 more doses 07/05/16  Yes Bonnielee Haff, MD  multivitamin (RENA-VIT) TABS tablet Take 1 tablet by mouth at bedtime. 07/04/16  Yes Bonnielee Haff, MD  [facility] DELICA LANCETS FINE MISC Use to help  check blood sugars twice a day Dx e11.9 01/09/16  Yes Biagio Borg, MD  pantoprazole (PROTONIX) 40 MG tablet Take 1 tablet (40 mg total) by mouth 2 (two) times daily before a meal. 07/07/16  Yes Dayna N Dunn, PA-C  senna (SENOKOT) 8.6 MG TABS tablet Take 2 tablets (17.2 mg total) by mouth 2 (two) times daily before a meal. Patient taking differently: Take 2 tablets by mouth daily as needed for mild constipation.  12/20/15  Yes Ivan Anchors Love, PA-C  sevelamer carbonate (RENVELA) 800 MG tablet Take 1,600 mg by mouth 3 (three) times daily with meals.   Yes Historical Provider, MD  traZODone (DESYREL) 50 MG tablet TAKE 0.5-1 TABLETS (25-50 MG TOTAL) BY MOUTH AT BEDTIME AS NEEDED FOR SLEEP. 02/18/16  Yes Ankit Lorie Phenix, MD    Review of Systems    She denies chest pain,  palpitations, dyspnea, pnd, orthopnea, n, v, dizziness, syncope, edema, weight gain, or early satiety. She has had some loose stools in the setting of continuing to use stool softener.  All other systems reviewed and are otherwise negative except as noted above.  Physical Exam    VS:  BP (!) 130/48   Pulse 77   Ht 4\' 9"  (1.448 m)   Wt 120 lb 12.8 oz (54.8 kg)   BMI 26.14 kg/m  , BMI Body mass index is 26.14 kg/m. GEN: Well nourished, well developed, in no acute distress.  HEENT: normal.  Neck: Supple, no JVD, carotid bruits, or masses. Cardiac: YKD,9/8 systolic ejection murmur throughout sternal border with a soft diastolic murmur as well. No rubs, or gallops. No clubbing, cyanosis, edema.  Bilateral BKA. Right groin without bleeding, bruit, or hematoma. Left upper extremity AV fistula with early and thrill. Respiratory:  Respirations regular and unlabored, clear to auscultation bilaterally. GI: Soft, nontender, nondistended, BS + x 4. MS: no deformity or atrophy. Skin: warm and dry, no rash. Neuro:  Strength and sensation are intact. Psych: Normal affect.  Accessory Clinical Findings    ECG - Regular sinus rhythm, 77, lateral T-wave inversion. No acute changes.  Assessment & Plan    1.  Non-ST segment elevation myocardial infarction, subsequent [employer]/coronary artery disease: Status post recent PCI and stenting of the native left circumflex. She does have moderate residual diffuse disease. She has not been having any chest pain or dyspnea since her most recent ER visit for sharp chest pain. This has improved since taking Protonix. Her cardiac standpoint, she remains on aspirin, statin, and Plavix therapy. She is not on a beta blocker secondary to soft blood pressures with hypotension during dialysis. She does not think she will participate in cardiac rehabilitation due to bilateral BKA and requirement for a walker.  2. Ischemic cardiomyopathy/chronic systolic congestive heart  failure: EF 40-45% during hospitalization. More notable with significant valvular disease including severe aortic insufficiency and moderate to severe mitral regurgitation. Volume is stable and managed by nephrology during dialysis. She is not on beta blocker and ACE inhibitor secondary to soft blood pressures.  3. Valvular heart disease including severe aortic insufficiency and moderate to severe mitral regurgitation: This is currently being conservatively managed. She was felt to be a poor surgical candidate when evaluated by surgery during her hospitalization. She is not currently experiencing any dyspnea or chest pain.  4. Essential hypertension: Complicated by periodic hypotension during dialysis. She is on no medications for blood pressure at this point.  5. Hyperlipidemia: Last LDL we have on file was 135 in  September 2016. She is on high potency statin therapy and we will arrange for follow-up lipids and LFTs in approximately 6 weeks.  6. Type 2 diabetes mellitus: Managed with oral therapy per primary care.  7. ESRD:  On dialysis and followed closely by nephrology.  8.  Esophagitis/GERD:  Now stable back on PPI therapy.  9.  Disposition: Follow-up with Dr. Gwenlyn Found in 2-3 months or sooner if necessary.   Murray Hodgkins, NP 07/16/2016, 12:34 PM

## 2016-07-30 DIAGNOSIS — N186 End stage renal disease: Secondary | ICD-10-CM | POA: Diagnosis not present

## 2016-07-30 DIAGNOSIS — E1129 Type 2 diabetes mellitus with other diabetic kidney complication: Secondary | ICD-10-CM | POA: Diagnosis not present

## 2016-07-30 DIAGNOSIS — Z992 Dependence on renal dialysis: Secondary | ICD-10-CM | POA: Diagnosis not present

## 2016-08-13 DIAGNOSIS — N186 End stage renal disease: Secondary | ICD-10-CM | POA: Diagnosis not present

## 2016-08-13 DIAGNOSIS — I871 Compression of vein: Secondary | ICD-10-CM | POA: Diagnosis not present

## 2016-08-13 DIAGNOSIS — T82858A Stenosis of vascular prosthetic devices, implants and grafts, initial encounter: Secondary | ICD-10-CM | POA: Diagnosis not present

## 2016-08-13 DIAGNOSIS — Z992 Dependence on renal dialysis: Secondary | ICD-10-CM | POA: Diagnosis not present

## 2016-08-18 ENCOUNTER — Other Ambulatory Visit: Payer: Self-pay | Admitting: Physician Assistant

## 2016-08-30 DIAGNOSIS — Z992 Dependence on renal dialysis: Secondary | ICD-10-CM | POA: Diagnosis not present

## 2016-08-30 DIAGNOSIS — E1129 Type 2 diabetes mellitus with other diabetic kidney complication: Secondary | ICD-10-CM | POA: Diagnosis not present

## 2016-08-30 DIAGNOSIS — N186 End stage renal disease: Secondary | ICD-10-CM | POA: Diagnosis not present

## 2016-09-06 ENCOUNTER — Other Ambulatory Visit: Payer: Self-pay | Admitting: Physician Assistant

## 2016-09-08 NOTE — Telephone Encounter (Signed)
Rx has been sent to the pharmacy electronically. ° °

## 2016-09-09 ENCOUNTER — Other Ambulatory Visit: Payer: Self-pay | Admitting: Internal Medicine

## 2016-09-11 ENCOUNTER — Telehealth: Payer: Self-pay

## 2016-09-11 ENCOUNTER — Other Ambulatory Visit (INDEPENDENT_AMBULATORY_CARE_PROVIDER_SITE_OTHER): Payer: BC Managed Care – PPO

## 2016-09-11 ENCOUNTER — Ambulatory Visit (INDEPENDENT_AMBULATORY_CARE_PROVIDER_SITE_OTHER): Payer: BC Managed Care – PPO | Admitting: Internal Medicine

## 2016-09-11 ENCOUNTER — Encounter: Payer: Self-pay | Admitting: Internal Medicine

## 2016-09-11 VITALS — BP 130/78 | HR 80 | Temp 98.0°F | Resp 20 | Wt 121.0 lb

## 2016-09-11 DIAGNOSIS — I10 Essential (primary) hypertension: Secondary | ICD-10-CM | POA: Diagnosis not present

## 2016-09-11 DIAGNOSIS — E1142 Type 2 diabetes mellitus with diabetic polyneuropathy: Secondary | ICD-10-CM | POA: Diagnosis not present

## 2016-09-11 DIAGNOSIS — E785 Hyperlipidemia, unspecified: Secondary | ICD-10-CM

## 2016-09-11 LAB — CBC WITH DIFFERENTIAL/PLATELET
Basophils Absolute: 0.1 10*3/uL (ref 0.0–0.1)
Basophils Relative: 2.3 % (ref 0.0–3.0)
Eosinophils Absolute: 0.5 10*3/uL (ref 0.0–0.7)
Eosinophils Relative: 8.2 % — ABNORMAL HIGH (ref 0.0–5.0)
HCT: 52.1 % — ABNORMAL HIGH (ref 36.0–46.0)
Hemoglobin: 16.9 g/dL — ABNORMAL HIGH (ref 12.0–15.0)
Lymphocytes Relative: 41.3 % (ref 12.0–46.0)
Lymphs Abs: 2.3 10*3/uL (ref 0.7–4.0)
MCHC: 32.5 g/dL (ref 30.0–36.0)
MCV: 89.9 fl (ref 78.0–100.0)
Monocytes Absolute: 0.6 10*3/uL (ref 0.1–1.0)
Monocytes Relative: 10.4 % (ref 3.0–12.0)
Neutro Abs: 2.1 10*3/uL (ref 1.4–7.7)
Neutrophils Relative %: 37.8 % — ABNORMAL LOW (ref 43.0–77.0)
Platelets: 169 10*3/uL (ref 150.0–400.0)
RBC: 5.8 Mil/uL — ABNORMAL HIGH (ref 3.87–5.11)
RDW: 18.6 % — ABNORMAL HIGH (ref 11.5–15.5)
WBC: 5.5 10*3/uL (ref 4.0–10.5)

## 2016-09-11 LAB — HEPATIC FUNCTION PANEL
ALT: 7 U/L (ref 0–35)
AST: 15 U/L (ref 0–37)
Albumin: 4.1 g/dL (ref 3.5–5.2)
Alkaline Phosphatase: 93 U/L (ref 39–117)
Bilirubin, Direct: 0.1 mg/dL (ref 0.0–0.3)
Total Bilirubin: 0.5 mg/dL (ref 0.2–1.2)
Total Protein: 7.6 g/dL (ref 6.0–8.3)

## 2016-09-11 LAB — BASIC METABOLIC PANEL
BUN: 19 mg/dL (ref 6–23)
CO2: 37 mEq/L — ABNORMAL HIGH (ref 19–32)
Calcium: 9.4 mg/dL (ref 8.4–10.5)
Chloride: 95 mEq/L — ABNORMAL LOW (ref 96–112)
Creatinine, Ser: 4.14 mg/dL — ABNORMAL HIGH (ref 0.40–1.20)
GFR: 13.92 mL/min — CL (ref >60.00)
Glucose, Bld: 151 mg/dL — ABNORMAL HIGH (ref 70–99)
Potassium: 3.8 mEq/L (ref 3.5–5.1)
Sodium: 138 mEq/L (ref 135–145)

## 2016-09-11 LAB — LIPID PANEL
Cholesterol: 131 mg/dL (ref 0–200)
HDL: 56.5 mg/dL (ref >39.00)
LDL Cholesterol: 58 mg/dL (ref 0–99)
NonHDL: 74.97
Total CHOL/HDL Ratio: 2
Triglycerides: 86 mg/dL (ref 0.0–149.0)
VLDL: 17.2 mg/dL (ref 0.0–40.0)

## 2016-09-11 LAB — HEMOGLOBIN A1C: Hgb A1c MFr Bld: 7.3 % — ABNORMAL HIGH (ref 4.6–6.5)

## 2016-09-11 LAB — TSH: TSH: 2.11 u[IU]/mL (ref 0.35–4.50)

## 2016-09-11 MED ORDER — INSULIN ASPART 100 UNIT/ML ~~LOC~~ SOLN: 5.0000 [IU] | Freq: Three times a day (TID) | SUBCUTANEOUS | 11 refills | Status: DC

## 2016-09-11 NOTE — Assessment & Plan Note (Signed)
stable overall by history and exam, recent data reviewed with pt, and pt to continue medical treatment as before except to add novolog 5 units ac for sugar > 160,  to f/u any worsening symptoms or concerns Lab Results  Component Value Date   HGBA1C 8.3 03/12/2016   For f/u lab today

## 2016-09-11 NOTE — Assessment & Plan Note (Signed)
stable overall by history and exam, recent data reviewed with pt, and pt to continue medical treatment as before,  to f/u any worsening symptoms or concerns BP Readings from Last 3 Encounters:  09/11/16 130/78  07/16/16 (!) 130/48  07/07/16 135/68

## 2016-09-11 NOTE — Patient Instructions (Addendum)
Please take all new medication as prescribed - the novolog (short acting) insulin at 5 units with your meals but only if the sugar is More than 160  Please continue all other medications as before, and refills have been done if requested.  Please have the pharmacy call with any other refills you may need.  Please continue your efforts at being more active, low cholesterol diabetic diet, and weight control  Please keep your appointments with your specialists as you may have planned  Please go to the LAB in the Basement (turn left off the elevator) for the tests to be done today  You will be contacted by phone if any changes need to be made immediately.  Otherwise, you will receive a letter about your results with an explanation, but please check with MyChart first.  Please remember to sign up for MyChart if you have not done so, as this will be important to you in the future with finding out test results, communicating by private email, and scheduling acute appointments online when needed.  If you have Medicare related insurance (such as traditoinal Medicare, [employer] or [employer], or similar), Please make an appointment at the Scheduling desk with Maudie Mercury, the [employer], for your Wellness Visit in this office, which is a benefit with your insurance.  Please return in 6 months, or sooner if needed

## 2016-09-11 NOTE — Progress Notes (Signed)
Subjective:    Patient ID: Carolyn Valenzuela, female    DOB: Sep 24, 1950, 66 y.o.   MRN: [id number]  HPI  Here to f/u -  States takes glipizide later in the day on dialysis days (M-w-f) to avoid but sugars still some labile.  Pt denies chest pain, increasing sob or doe, wheezing, orthopnea, PND, increased LE swelling, palpitations, dizziness or syncope.  Pt denies new neurological symptoms such as new headache, or facial or extremity weakness or numbness.  Pt denies polydipsia, polyuria, or low sugar episode.   Pt denies new neurological symptoms such as new headache, or facial or extremity weakness or numbness.   Pt states overall good compliance with meds, mostly trying to follow appropriate diet, with wt overall stable,  but little exercise however, s/p bilat BKA with prosthesis x 2 but left needs to be redone as not fitting well.  Has taken insulin in the past and willing to restart.  Past Medical History:  Diagnosis Date  . Allergic rhinitis, cause unspecified 12/08/2010  . ANEMIA-IRON DEFICIENCY 01/25/2008  . Aortic regurgitation   . CAD (coronary artery disease)    a. 07/2016 NSTEMI/PCI: LM nl, LAD 70ost, 16m/d, 80d, D1 40ost, LCX 99ost/p (2.5x16 Synergy DES - 2.75), 40/19m, Om2 100 CTO, RCA  30p/m. Pt eval by CT surgery, not felt to be suitable candidate 2/2 comorbidities.  . CERVICAL RADICULOPATHY, LEFT 01/25/2008  . Chronic systolic CHF (congestive heart failure) ([place])    a. 07/2016 Echo: EF 40-45%, diff HK, sev AI, mild MVP w/ mod to sev MR, PASP 9mmHg.  . Critical lower limb ischemia   . DIABETES MELLITUS, UNCONTROLLED 05/21/2009  . Esophagitis   . ESRD on dialysis ([place])   . Foot ulcer ([place])    right lateral malleolus  . HCAP (healthcare-associated pneumonia) 06/29/2016  . HYPERLIPIDEMIA 03/30/2007  . HYPERTENSION 01/25/2008  . Ischemic cardiomyopathy    a. 07/2016 Echo: EF 40-45%.  . Mitral regurgitation    a. 07/2016 Echo: Mild MVP w/ mod to sev MR.  [name] PVD (peripheral vascular  disease) ([place])    a. s/p bilat BKA  . SECONDARY HYPERPARATHYROIDISM 05/21/2009  . Severe aortic regurgitation    a. 07/2016 Echo: Severe AI.   Past Surgical History:  Procedure Laterality Date  . AMPUTATION Right 03/15/2014   Procedure: RIGHT  LEG  BELOW KNEE AMPUTATION ;  Surgeon: [name], MD;  Location: [place];  Service: Orthopedics;  Laterality: Right;  . AMPUTATION Left 11/29/2015   Procedure: AMPUTATION BELOW KNEE;  Surgeon: Newt Minion, MD;  Location: [place];  Service: Orthopedics;  Laterality: Left;  . AV FISTULA PLACEMENT Left   . AV FISTULA PLACEMENT Left 09/06/2014   Procedure: ARTERIOVENOUS (AV) FISTULA CREATION-Left Brachiocephalic;  Surgeon: Conrad [place], MD;  Location: [place];  Service: Vascular;  Laterality: Left;  . CARDIAC CATHETERIZATION N/A 06/30/2016   Procedure: Left Heart Cath and Coronary Angiography;  Surgeon: Burnell Blanks, MD;  Location: [place] [facility];  Service: Cardiovascular;  Laterality: N/A;  . CARDIAC CATHETERIZATION N/A 07/02/2016   Procedure: Coronary Stent Intervention;  Surgeon: Nelva Bush, MD;  Location: [place] [facility];  Service: Cardiovascular;  Laterality: N/A;  . COLONOSCOPY    . CORONARY ANGIOPLASTY WITH STENT PLACEMENT  07/02/2016  . ESOPHAGOGASTRODUODENOSCOPY N/A 03/20/2016   Procedure: ESOPHAGOGASTRODUODENOSCOPY (EGD);  Surgeon: Milus Banister, MD;  Location: [place];  Service: Endoscopy;  Laterality: N/A;  . EYE SURGERY Bilateral    cataracts removed, left eye  still has some oil in it.  . INSERTION OF DIALYSIS CATHETER N/A 09/03/2014   Procedure: INSERTION OF DIALYSIS CATHETER RIGHT INTERNAL JUGULAR VEIN;  Surgeon: Conrad [place], MD;  Location: [place];  Service: Vascular;  Laterality: N/A;  . PERIPHERAL VASCULAR CATHETERIZATION N/A 08/13/2015   Procedure: Abdominal Aortogram;  Surgeon: Elam Dutch, MD;  Location: [place] [facility];  Service: Cardiovascular;  Laterality: N/A;    reports that she has never smoked. She  has never used smokeless tobacco. She reports that she does not drink alcohol or use drugs. family history includes Cancer in her mother. Allergies  Allergen Reactions  . Oxycodone Itching   Current Outpatient Prescriptions on File Prior to Visit  Medication Sig Dispense Refill  . acetaminophen (TYLENOL) 325 MG tablet Take 2 tablets (650 mg total) by mouth every 6 (six) hours as needed for mild pain, moderate pain or fever.    [name] aspirin 81 MG EC tablet Take 81 mg by mouth daily.      [name] atorvastatin (LIPITOR) 80 MG tablet Take 1 tablet (80 mg total) by mouth daily at 6 PM. 30 tablet 2  . clopidogrel (PLAVIX) 75 MG tablet Take 1 tablet (75 mg total) by mouth daily. 30 tablet 2  . [employer] ACID REDUCER 10 MG tablet TAKE 1 TABLET (10 MG TOTAL) BY MOUTH DAILY. 30 tablet 1  . gabapentin (NEURONTIN) 100 MG capsule Take 1 capsule by mouth 2 (two) times daily.  3  . glipiZIDE (GLUCOTROL XL) 10 MG 24 hr tablet Take 1 tablet (10 mg total) by mouth daily with breakfast. 90 tablet 3  . glucose blood (ONETOUCH VERIO) test strip 1 each by Other route 2 (two) times daily. Use to check blood sugars twice a day Dx E11.9 100 each 3  . multivitamin (RENA-VIT) TABS tablet TAKE 1 TABLET BY MOUTH EVERY DAY AT BEDTIME 30 tablet 0  . ONETOUCH DELICA LANCETS FINE MISC Use to help check blood sugars twice a day Dx e11.9 100 each 3  . pantoprazole (PROTONIX) 40 MG tablet TAKE 1 TABLET BY MOUTH TWICE A DAY BEFORE MEALS 60 tablet 5  . senna (SENOKOT) 8.6 MG TABS tablet Take 2 tablets (17.2 mg total) by mouth 2 (two) times daily before a meal. (Patient taking differently: Take 2 tablets by mouth daily as needed for mild constipation. ) 120 each 1  . sevelamer carbonate (RENVELA) 800 MG tablet Take 1,600 mg by mouth 3 (three) times daily with meals.    . traZODone (DESYREL) 50 MG tablet TAKE 0.5-1 TABLETS (25-50 MG TOTAL) BY MOUTH AT BEDTIME AS NEEDED FOR SLEEP. 30 tablet 1   No current facility-administered medications on file  prior to visit.    Review of Systems  Constitutional: Negative for unusual diaphoresis or night sweats HENT: Negative for ear swelling or discharge Eyes: Negative for worsening visual haziness  Respiratory: Negative for choking and stridor.   Gastrointestinal: Negative for distension or worsening eructation Genitourinary: Negative for retention or change in urine volume.  Musculoskeletal: Negative for other MSK pain or swelling Skin: Negative for color change and worsening wound Neurological: Negative for tremors and numbness other than noted  Psychiatric/Behavioral: Negative for decreased concentration or agitation other than above   All other system neg per pt    Objective:   Physical Exam BP 130/78   Pulse 80   Temp 98 F (36.7 C) (Oral)   Resp 20   Wt 121 lb (54.9 kg)   SpO2 97%  BMI 26.18 kg/m  VS noted,  Constitutional: Pt appears in no apparent distress HENT: Head: NCAT.  Right Ear: External ear normal.  Left Ear: External ear normal.  Eyes: . Pupils are equal, round, and reactive to light. Conjunctivae and EOM are normal Neck: Normal range of motion. Neck supple.  Cardiovascular: Normal rate and regular rhythm.   Pulmonary/Chest: Effort normal and breath sounds without rales or wheezing.  Abd:  Soft, NT, ND, + BS Neurological: Pt is alert. Not confused , motor grossly intact Skin: Skin is warm. No rash, no LE edema Psychiatric: Pt behavior is normal. No agitation.  S/p bilat BKA    Assessment & Plan:

## 2016-09-11 NOTE — Assessment & Plan Note (Signed)
stable overall by history and exam, recent data reviewed with pt, and pt to continue medical treatment as before,  to f/u any worsening symptoms or concerns Lab Results  Component Value Date   LDLCALC 135 (H) 05/28/2015   For lower chol diet, for f/u lab today, goal ldl < 70

## 2016-09-11 NOTE — Progress Notes (Signed)
Pre visit review using our clinic review tool, if applicable. No additional management support is needed unless otherwise documented below in the visit note. 

## 2016-09-11 NOTE — Telephone Encounter (Signed)
Critical GFR: 13.92

## 2016-09-14 ENCOUNTER — Telehealth: Payer: Self-pay | Admitting: Internal Medicine

## 2016-09-14 NOTE — Telephone Encounter (Signed)
Pt stopped by office stating that the insulin that the MD wants her to get is $100-$125 dollars and that is too expensive for her. She states that she can stick with the pills that were prescribed. Please advise.

## 2016-09-15 ENCOUNTER — Other Ambulatory Visit (INDEPENDENT_AMBULATORY_CARE_PROVIDER_SITE_OTHER): Payer: BC Managed Care – PPO

## 2016-09-15 ENCOUNTER — Encounter: Payer: Self-pay | Admitting: Internal Medicine

## 2016-09-15 DIAGNOSIS — I10 Essential (primary) hypertension: Secondary | ICD-10-CM | POA: Diagnosis not present

## 2016-09-15 DIAGNOSIS — E785 Hyperlipidemia, unspecified: Secondary | ICD-10-CM | POA: Diagnosis not present

## 2016-09-15 DIAGNOSIS — E1342 Other specified diabetes mellitus with diabetic polyneuropathy: Secondary | ICD-10-CM | POA: Diagnosis not present

## 2016-09-15 LAB — URINALYSIS, ROUTINE W REFLEX MICROSCOPIC
Bilirubin Urine: NEGATIVE
Ketones, ur: NEGATIVE
Leukocytes, UA: NEGATIVE
Nitrite: NEGATIVE
Specific Gravity, Urine: 1.01 (ref 1.000–1.030)
Total Protein, Urine: 100 — AB
Urine Glucose: 250 — AB
Urobilinogen, UA: 0.2 (ref 0.0–1.0)
pH: 7.5 (ref 5.0–8.0)

## 2016-09-15 LAB — MICROALBUMIN / CREATININE URINE RATIO
Creatinine,U: 50.8 mg/dL
Microalb Creat Ratio: 212.7 mg/g — ABNORMAL HIGH (ref 0.0–30.0)
Microalb, Ur: 108.1 mg/dL — ABNORMAL HIGH (ref 0.0–1.9)

## 2016-09-15 MED ORDER — INSULIN LISPRO 100 UNIT/ML (KWIKPEN): PEN_INJECTOR | SUBCUTANEOUS | 11 refills | Status: DC

## 2016-09-15 NOTE — Telephone Encounter (Signed)
Prescription sent to pharmacy.

## 2016-09-15 NOTE — Telephone Encounter (Signed)
Ok to try change to Humalog, maybe this is less expensive;  Pt needs the insulin as the oral medications were not working well enough

## 2016-09-29 ENCOUNTER — Ambulatory Visit (INDEPENDENT_AMBULATORY_CARE_PROVIDER_SITE_OTHER): Payer: BC Managed Care – PPO | Admitting: Cardiovascular Disease

## 2016-09-29 ENCOUNTER — Encounter: Payer: Self-pay | Admitting: Cardiovascular Disease

## 2016-09-29 DIAGNOSIS — I998 Other disorder of circulatory system: Secondary | ICD-10-CM

## 2016-09-29 DIAGNOSIS — I10 Essential (primary) hypertension: Secondary | ICD-10-CM

## 2016-09-29 DIAGNOSIS — I214 Non-ST elevation (NSTEMI) myocardial infarction: Secondary | ICD-10-CM | POA: Diagnosis not present

## 2016-09-29 DIAGNOSIS — E7800 Pure hypercholesterolemia, unspecified: Secondary | ICD-10-CM

## 2016-09-29 DIAGNOSIS — I70229 Atherosclerosis of native arteries of extremities with rest pain, unspecified extremity: Secondary | ICD-10-CM

## 2016-09-29 NOTE — Patient Instructions (Signed)
Medication Instructions: Your physician recommends that you continue on your current medications as directed. Please refer to the Current Medication list given to you today.   Follow-Up: We request that you follow-up in: 3 months with Ignacia Bayley, NP and in 12 months with Dr Andria Rhein will receive a reminder letter in the mail two months in advance. If you don't receive a letter, please call our office to schedule the follow-up appointment.  If you need a refill on your cardiac medications before your next appointment, please call your pharmacy.

## 2016-09-29 NOTE — Progress Notes (Signed)
09/29/2016 [name]   1950/10/03  [id number]  Primary Physician Cathlean Cower, MD Primary Cardiologist: Lorretta Harp MD [name], [place], [place]  HPI:  Carolyn Valenzuela is a 66y.o. female with h/o type 2 diabetes, hyperlipidemia, Hypertension, chronic kidney disease currently on hemodialysis who was referred for evaluation of peripheral vascular disease. I last saw her in the office 01/09/14. Patient reports that she has been having some discomfort in the lateral aspects of her right and left toes. Pain is present both at rest and with activity, has been ongoing for 4-5 months. It is worst with touch. She also has a chronic wound on her right malleolus for which she is seen by her podiatrist, Dr. Elby Showers. She otherwise denies any pain in her calves or legs with ambulation.  She denies any shortness of breath, chest pain, recent lower extremity swelling or palpitations.  She works at [employer] at [employer] and is on her feet all day. She doesn't exercise on a regular basis.  She has never smoked. She denies any history of heart disease and any family history of ischemic heart disease.   I performed lower extremity arterial Doppler studies which did not show significant obstructive disease however she is to have diminished TBIs  bilaterally suggesting the possibility of poor healing as a result of a surgical procedure.. She ultimately has undergone bilateral below-the-knee amputations by Dr. Doran Durand and Sharol Given. In addition, she has known CAD documented angiographically in October of last year by Dr. Merlene Pulling. She had surgical anatomy however she was turned down for surgical revascularization because of frailty and comorbidities. She'll underwent circumflex stenting by Dr. Gerald Stabs End 07/02/16 with a synergy drug-eluting stent. A 2-D echocardiogram has shown moderate LV dysfunction with moderate to severe MR and aortic insufficiency as well.  Current Outpatient  Prescriptions  Medication Sig Dispense Refill  . acetaminophen (TYLENOL) 325 MG tablet Take 2 tablets (650 mg total) by mouth every 6 (six) hours as needed for mild pain, moderate pain or fever.    [name] aspirin 81 MG EC tablet Take 81 mg by mouth daily.      [name] atorvastatin (LIPITOR) 80 MG tablet Take 1 tablet (80 mg total) by mouth daily at 6 PM. 30 tablet 2  . [facility] ACID REDUCER 10 MG tablet TAKE 1 TABLET (10 MG TOTAL) BY MOUTH DAILY. 30 tablet 1  . gabapentin (NEURONTIN) 100 MG capsule Take 1 capsule by mouth 2 (two) times daily.  3  . glipiZIDE (GLUCOTROL XL) 10 MG 24 hr tablet Take 1 tablet (10 mg total) by mouth daily with breakfast. 90 tablet 3  . glucose blood (ONETOUCH VERIO) test strip 1 each by Other route 2 (two) times daily. Use to check blood sugars twice a day Dx E11.9 100 each 3  . multivitamin (RENA-VIT) TABS tablet TAKE 1 TABLET BY MOUTH EVERY DAY AT BEDTIME 30 tablet 0  . ONETOUCH DELICA LANCETS FINE MISC Use to help check blood sugars twice a day Dx e11.9 100 each 3  . pantoprazole (PROTONIX) 40 MG tablet TAKE 1 TABLET BY MOUTH TWICE A DAY BEFORE MEALS 60 tablet 5  . senna (SENOKOT) 8.6 MG TABS tablet Take 2 tablets (17.2 mg total) by mouth 2 (two) times daily before a meal. (Patient taking differently: Take 2 tablets by mouth daily as needed for mild constipation. ) 120 each 1  . sevelamer carbonate (RENVELA) 800 MG tablet Take 1,600 mg by mouth 3 (  three) times daily with meals.    . traZODone (DESYREL) 50 MG tablet TAKE 0.5-1 TABLETS (25-50 MG TOTAL) BY MOUTH AT BEDTIME AS NEEDED FOR SLEEP. 30 tablet 1   No current facility-administered medications for this visit.     Allergies  Allergen Reactions  . Oxycodone Itching    Social History   Social History  . Marital status: Single    Spouse name: N/A  . Number of children: N/A  . Years of education: N/A   Occupational History  . [place] History Main Topics  . Smoking status: Never  Smoker  . Smokeless tobacco: Never Used  . Alcohol use No  . Drug use: No  . Sexual activity: Not on file   Other Topics Concern  . Not on file   Social History Narrative  . No narrative on file     Review of Systems: General: negative for chills, fever, night sweats or weight changes.  Cardiovascular: negative for chest pain, dyspnea on exertion, edema, orthopnea, palpitations, paroxysmal nocturnal dyspnea or shortness of breath Dermatological: negative for rash Respiratory: negative for cough or wheezing Urologic: negative for hematuria Abdominal: negative for nausea, vomiting, diarrhea, bright red blood per rectum, melena, or hematemesis Neurologic: negative for visual changes, syncope, or dizziness All other systems reviewed and are otherwise negative except as noted above.    Blood pressure (!) 179/80, pulse 79, height 4\' 11"  (1.499 m), weight 123 lb (55.8 kg), SpO2 93 %.  General appearance: alert and no distress Neck: no adenopathy, no carotid bruit, no JVD, supple, symmetrical, trachea midline and thyroid not enlarged, symmetric, no tenderness/mass/nodules Lungs: clear to auscultation bilaterally Heart: regular rate and rhythm, S1, S2 normal, no murmur, click, rub or gallop Abdomen: soft, non-tender; bowel sounds normal; no masses,  no organomegaly Pulses: Bilateral BKA's  EKG not performed today  ASSESSMENT AND PLAN:   Hyperlipidemia History of hyperlipidemia on statin therapy with recent lipid profile performed 09/11/16 revealing total cholesterol 131, LDL 58 and HDL 56  Essential hypertension History of hypertension blood pressure measured at 179/80. This is significantly higher than her previous blood pressure performed 2 weeks ago. She is not on antihypertensive medications.  Critical lower limb ischemia History of bilateral BKA's performed by Dr. Doran Durand on the right side 03/15/14 admitted Dr. Sharol Given on the left side 11/29/15  NSTEMI (non-ST elevated myocardial  infarction) [facility]) History of CAD catheterization performed by Dr. Julianne Handler  06/30/16 revealing two-vessel disease. She was thought to have surgical anatomy however because of her frailty and comorbidities she was turned down for surgical revascularization. She ultimately underwent stenting of her proximal circumflex by Dr. Saunders Revel 07/02/16 for synergy drug-eluting stent. She denies chest pain or shortness of breath. She remains on aspirin but is not on clopidogrel.      Lorretta Harp MD FACP,FACC,FAHA, [facility] 09/29/2016 10:45 AM

## 2016-09-29 NOTE — Assessment & Plan Note (Signed)
History of hypertension blood pressure measured at 179/80. This is significantly higher than her previous blood pressure performed 2 weeks ago. She is not on antihypertensive medications.

## 2016-09-29 NOTE — Assessment & Plan Note (Signed)
History of hyperlipidemia on statin therapy with recent lipid profile performed 09/11/16 revealing total cholesterol 131, LDL 58 and HDL 56

## 2016-09-29 NOTE — Assessment & Plan Note (Signed)
History of CAD catheterization performed by Dr. Julianne Handler  06/30/16 revealing two-vessel disease. She was thought to have surgical anatomy however because of her frailty and comorbidities she was turned down for surgical revascularization. She ultimately underwent stenting of her proximal circumflex by Dr. Saunders Revel 07/02/16 for synergy drug-eluting stent. She denies chest pain or shortness of breath. She remains on aspirin but is not on clopidogrel.

## 2016-09-29 NOTE — Assessment & Plan Note (Signed)
History of bilateral BKA's performed by Dr. Doran Durand on the right side 03/15/14 admitted Dr. Sharol Given on the left side 11/29/15

## 2016-09-30 DIAGNOSIS — E1129 Type 2 diabetes mellitus with other diabetic kidney complication: Secondary | ICD-10-CM | POA: Diagnosis not present

## 2016-09-30 DIAGNOSIS — N186 End stage renal disease: Secondary | ICD-10-CM | POA: Diagnosis not present

## 2016-09-30 DIAGNOSIS — Z992 Dependence on renal dialysis: Secondary | ICD-10-CM | POA: Diagnosis not present

## 2016-10-06 ENCOUNTER — Other Ambulatory Visit: Payer: Self-pay | Admitting: Internal Medicine

## 2016-10-08 ENCOUNTER — Telehealth: Payer: Self-pay | Admitting: Cardiovascular Disease

## 2016-10-08 MED ORDER — CLOPIDOGREL BISULFATE 75 MG PO TABS: 75.0000 mg | ORAL_TABLET | Freq: Every day | ORAL | 3 refills | Status: DC

## 2016-10-08 NOTE — Telephone Encounter (Signed)
Pt was seen by Dr. Gwenlyn Found on 09/02/16, stated she was not taking her Plavix. Spoke to pt today to remind her to continue takin Plavix. She stated she needed more refills.  [facility] [address]

## 2016-10-16 MED ORDER — INSULIN PEN NEEDLE 31G X 8 MM MISC: 11 refills | Status: DC

## 2016-10-16 MED ORDER — INSULIN LISPRO 100 UNIT/ML (KWIKPEN): 5.0000 [IU] | PEN_INJECTOR | Freq: Three times a day (TID) | SUBCUTANEOUS | 11 refills | Status: DC

## 2016-10-16 NOTE — Addendum Note (Signed)
Addended by: Earnstine Regal on: 10/16/2016 02:38 PM   Modules accepted: Orders

## 2016-10-16 NOTE — Telephone Encounter (Signed)
Rec'd call pt states she is needing something else for her diabetes because the pharmacy will not give her a refill on her glipizide until next week. Inform pt per chart at her last visit MD states the Glipizide was not controlling her diabetes and he rx novolog, and it was change to humalog due to it being too expensive.Pt states she did not know he sent in a replacement. Inform pt he rx humalog 5 units three times a day. Inform [facility].../lmb

## 2016-10-19 ENCOUNTER — Telehealth: Payer: Self-pay | Admitting: *Deleted

## 2016-10-19 MED ORDER — INSULIN ASPART 100 UNIT/ML CARTRIDGE (PENFILL): 5.0000 [IU] | Freq: Three times a day (TID) | SUBCUTANEOUS | 11 refills | Status: DC

## 2016-10-19 NOTE — Telephone Encounter (Signed)
Rec'd call pt states she only received pen needles she did not receive insulin pen. Inform pt rx was sent for the humalog pen [facility]. [facility]acist Shani she states pt plan will cover the Novolog not Humalog. We have on file for the Novolog vials, if pt want pen will need rx. Sent rx for the Novolog Pen. Notified pt w/Status...Johny Chess

## 2016-10-26 ENCOUNTER — Encounter (HOSPITAL_COMMUNITY): Payer: Self-pay | Admitting: *Deleted

## 2016-10-28 DIAGNOSIS — Z992 Dependence on renal dialysis: Secondary | ICD-10-CM | POA: Diagnosis not present

## 2016-10-28 DIAGNOSIS — N186 End stage renal disease: Secondary | ICD-10-CM | POA: Diagnosis not present

## 2016-10-28 DIAGNOSIS — E1129 Type 2 diabetes mellitus with other diabetic kidney complication: Secondary | ICD-10-CM | POA: Diagnosis not present

## 2016-11-11 ENCOUNTER — Other Ambulatory Visit: Payer: Self-pay | Admitting: Internal Medicine

## 2016-11-23 ENCOUNTER — Other Ambulatory Visit: Payer: Self-pay | Admitting: Internal Medicine

## 2016-12-01 DIAGNOSIS — I871 Compression of vein: Secondary | ICD-10-CM | POA: Diagnosis not present

## 2016-12-01 DIAGNOSIS — N186 End stage renal disease: Secondary | ICD-10-CM | POA: Diagnosis not present

## 2016-12-01 DIAGNOSIS — Z992 Dependence on renal dialysis: Secondary | ICD-10-CM | POA: Diagnosis not present

## 2016-12-01 DIAGNOSIS — Z4802 Encounter for removal of sutures: Secondary | ICD-10-CM | POA: Insufficient documentation

## 2017-01-05 ENCOUNTER — Encounter: Payer: Self-pay | Admitting: Internal Medicine

## 2017-01-05 ENCOUNTER — Ambulatory Visit (INDEPENDENT_AMBULATORY_CARE_PROVIDER_SITE_OTHER): Payer: BC Managed Care – PPO | Admitting: Internal Medicine

## 2017-01-05 VITALS — BP 152/74 | HR 73 | Ht 59.0 in | Wt 128.0 lb

## 2017-01-05 DIAGNOSIS — E1142 Type 2 diabetes mellitus with diabetic polyneuropathy: Secondary | ICD-10-CM | POA: Diagnosis not present

## 2017-01-05 DIAGNOSIS — G546 Phantom limb syndrome with pain: Secondary | ICD-10-CM | POA: Diagnosis not present

## 2017-01-05 DIAGNOSIS — I10 Essential (primary) hypertension: Secondary | ICD-10-CM | POA: Diagnosis not present

## 2017-01-05 MED ORDER — GABAPENTIN 100 MG PO CAPS: 200.0000 mg | ORAL_CAPSULE | Freq: Three times a day (TID) | ORAL | 1 refills | Status: DC

## 2017-01-05 NOTE — Assessment & Plan Note (Signed)
With some lability in sugars, would not increase antiDM regimen for now, recent a1c acceptable, but needs to be sure to have snack before bedtime to avoid AM low sugars

## 2017-01-05 NOTE — Progress Notes (Signed)
Subjective:    Patient ID: Carolyn Valenzuela Comment, female    DOB: Aug 18, 1951, 66 y.o.   MRN: [id number]  HPI  Here to f/u with c/o worsening phantom pain to stumps where gabapentin 100 bid not working so well in the last few days; Denies worsening LBP or radicular pain, or stump issue.  Pt denies chest pain, increased sob or doe, wheezing, orthopnea, PND, increased LE swelling, palpitations, but does have weakness and dizziness sometimes recently post dialysis with lower BP. Has seen Cardiology who per pt recommended an increased dry wt.. Pt denies new neurological symptoms such as new headache, or facial or extremity weakness or numbness  Pt denies polydipsia, polyuria, with cbg's in the AM as low as 66 but usually more 90-120's; and PM sugars in the low 200's; .Does not get low sugars when has snack at bedtime.    Pt states overall good compliance with meds including the glucotrol., is trying to follow lower cholesterol, diabetic diet, wt overall stable but little exercise however.   BP Readings from Last 3 Encounters:  01/05/17 (!) 152/74  09/29/16 (!) 179/80  09/11/16 130/78   Past Medical History:  Diagnosis Date  . Allergic rhinitis, cause unspecified 12/08/2010  . ANEMIA-IRON DEFICIENCY 01/25/2008  . Aortic regurgitation   . CAD (coronary artery disease)    a. 07/2016 NSTEMI/PCI: LM nl, LAD 70ost, 98m/d, 80d, D1 40ost, LCX 99ost/p (2.5x16 Synergy DES - 2.75), 40/34m, Om2 100 CTO, RCA  30p/m. Pt eval by CT surgery, not felt to be suitable candidate 2/2 comorbidities.  . CERVICAL RADICULOPATHY, LEFT 01/25/2008  . Chronic systolic CHF (congestive heart failure) ([place])    a. 07/2016 Echo: EF 40-45%, diff HK, sev AI, mild MVP w/ mod to sev MR, PASP 48mmHg.  . Critical lower limb ischemia   . DIABETES MELLITUS, UNCONTROLLED 05/21/2009  . Esophagitis   . ESRD on dialysis ([place])   . Foot ulcer ([place])    right lateral malleolus  . HCAP (healthcare-associated pneumonia) 06/29/2016  . HYPERLIPIDEMIA  03/30/2007  . HYPERTENSION 01/25/2008  . Ischemic cardiomyopathy    a. 07/2016 Echo: EF 40-45%.  . Mitral regurgitation    a. 07/2016 Echo: Mild MVP w/ mod to sev MR.  [name] PVD (peripheral vascular disease) ([place])    a. s/p bilat BKA  . SECONDARY HYPERPARATHYROIDISM 05/21/2009  . Severe aortic regurgitation    a. 07/2016 Echo: Severe AI.   Past Surgical History:  Procedure Laterality Date  . AMPUTATION Right 03/15/2014   Procedure: RIGHT  LEG  BELOW KNEE AMPUTATION ;  Surgeon: [name], MD;  Location: [place];  Service: Orthopedics;  Laterality: Right;  . AMPUTATION Left 11/29/2015   Procedure: AMPUTATION BELOW KNEE;  Surgeon: Newt Minion, MD;  Location: [place];  Service: Orthopedics;  Laterality: Left;  . AV FISTULA PLACEMENT Left   . AV FISTULA PLACEMENT Left 09/06/2014   Procedure: ARTERIOVENOUS (AV) FISTULA CREATION-Left Brachiocephalic;  Surgeon: Conrad Georgetown, MD;  Location: [place];  Service: Vascular;  Laterality: Left;  . CARDIAC CATHETERIZATION N/A 06/30/2016   Procedure: Left Heart Cath and Coronary Angiography;  Surgeon: Burnell Blanks, MD;  Location: [place] CV LAB;  Service: Cardiovascular;  Laterality: N/A;  . CARDIAC CATHETERIZATION N/A 07/02/2016   Procedure: Coronary Stent Intervention;  Surgeon: Nelva Bush, MD;  Location: [place] [facility];  Service: Cardiovascular;  Laterality: N/A;  . COLONOSCOPY    . CORONARY ANGIOPLASTY WITH STENT PLACEMENT  07/02/2016  . ESOPHAGOGASTRODUODENOSCOPY N/A 03/20/2016  Procedure: ESOPHAGOGASTRODUODENOSCOPY (EGD);  Surgeon: Milus Banister, MD;  Location: [place];  Service: Endoscopy;  Laterality: N/A;  . EYE SURGERY Bilateral    cataracts removed, left eye still has some oil in it.  . INSERTION OF DIALYSIS CATHETER N/A 09/03/2014   Procedure: INSERTION OF DIALYSIS CATHETER RIGHT INTERNAL JUGULAR VEIN;  Surgeon: Conrad Wood, MD;  Location: [place];  Service: Vascular;  Laterality: N/A;  . PERIPHERAL VASCULAR CATHETERIZATION  N/A 08/13/2015   Procedure: Abdominal Aortogram;  Surgeon: Elam Dutch, MD;  Location: [place] [facility];  Service: Cardiovascular;  Laterality: N/A;    reports that she has never smoked. She has never used smokeless tobacco. She reports that she does not drink alcohol or use drugs. family history includes Cancer in her mother. Allergies  Allergen Reactions  . Oxycodone Itching   Current Outpatient Prescriptions on File Prior to Visit  Medication Sig Dispense Refill  . aspirin 81 MG EC tablet Take 81 mg by mouth daily.      . clopidogrel (PLAVIX) 75 MG tablet Take 1 tablet (75 mg total) by mouth daily. 90 tablet 3  . [facility] ACID REDUCER 10 MG tablet TAKE 1 TABLET (10 MG TOTAL) BY MOUTH DAILY. 30 tablet 1  . glipiZIDE (GLUCOTROL XL) 10 MG 24 hr tablet Take 1 tablet (10 mg total) by mouth daily with breakfast. 90 tablet 3  . multivitamin (RENA-VIT) TABS tablet TAKE 1 TABLET BY MOUTH EVERY DAY AT BEDTIME 30 tablet 0  . pantoprazole (PROTONIX) 40 MG tablet TAKE 1 TABLET BY MOUTH TWICE A DAY BEFORE MEALS 60 tablet 5  . sevelamer carbonate (RENVELA) 800 MG tablet Take 1,600 mg by mouth 3 (three) times daily with meals.     No current facility-administered medications on file prior to visit.    Review of Systems  Constitutional: Negative for other unusual diaphoresis or sweats HENT: Negative for ear discharge or swelling Eyes: Negative for other worsening visual disturbances Respiratory: Negative for stridor or other swelling  Gastrointestinal: Negative for worsening distension or other blood Genitourinary: Negative for retention or other urinary change Musculoskeletal: Negative for other MSK pain or swelling Skin: Negative for color change or other new lesions Neurological: Negative for worsening tremors and other numbness  Psychiatric/Behavioral: Negative for worsening agitation or other fatigue All other system neg per pt    Objective:   Physical Exam BP (!) 152/74   Pulse 73    Ht 4\' 11"  (1.499 m)   Wt 128 lb (58.1 kg)   SpO2 100%   BMI 25.85 kg/m  VS noted,  Constitutional: Pt appears in NAD HENT: Head: NCAT.  Right Ear: External ear normal.  Left Ear: External ear normal.  Eyes: . Pupils are equal, round, and reactive to light. Conjunctivae and EOM are normal Nose: without d/c or deformity Neck: Neck supple. Gross normal ROM Cardiovascular: Normal rate and regular rhythm.   Pulmonary/Chest: Effort normal and breath sounds without rales or wheezing.  Abd:  Soft, NT, ND, + BS, no organomegaly Neurological: Pt is alert. At baseline orientation, motor grossly intact Skin: Skin is warm. No rashes, other new lesions, s/p bilat BKA Psychiatric: Pt behavior is normal without agitation  No other exam findings  Lab Results  Component Value Date   HGBA1C 7.3 (H) 09/11/2016       Assessment & Plan:

## 2017-01-05 NOTE — Patient Instructions (Addendum)
OK to increase the gabapentin to 2 pills three times per day  Please continue all other medications as before, and refills have been done if requested.  Please have the pharmacy call with any other refills you may need.  Please continue your efforts at being more active, low cholesterol diet, and weight control.  Be sure to take a snack before bedtime to avoid lower sugars in the next morning  Please keep your appointments with your specialists as you may have planned  Please return in 3 months, or sooner if needed

## 2017-01-05 NOTE — Assessment & Plan Note (Signed)
With labile BP, dry wt increased for dialysis, would not try to increase further antiBP med at this time

## 2017-01-05 NOTE — Progress Notes (Signed)
Pre visit review using our clinic review tool, if applicable. No additional management support is needed unless otherwise documented below in the visit note. 

## 2017-01-05 NOTE — Assessment & Plan Note (Signed)
Ok for increased gabapentin to 200 tid,  to f/u any worsening symptoms or concerns

## 2017-01-11 ENCOUNTER — Telehealth: Payer: Self-pay | Admitting: *Deleted

## 2017-01-11 NOTE — Telephone Encounter (Signed)
Pt called thought she through her medication in trash by mistake when I called her back she stated she fought her BS medications didn't need refill on med. Was trying to call the office back but no one pick up...Carolyn Valenzuela

## 2017-01-21 ENCOUNTER — Telehealth (INDEPENDENT_AMBULATORY_CARE_PROVIDER_SITE_OTHER): Payer: Self-pay | Admitting: Orthopedic Surgery

## 2017-01-21 DIAGNOSIS — R531 Weakness: Secondary | ICD-10-CM

## 2017-01-21 DIAGNOSIS — Z89511 Acquired absence of right leg below knee: Secondary | ICD-10-CM

## 2017-01-21 DIAGNOSIS — R2689 Other abnormalities of gait and mobility: Secondary | ICD-10-CM

## 2017-01-21 DIAGNOSIS — Z89512 Acquired absence of left leg below knee: Secondary | ICD-10-CM

## 2017-01-21 NOTE — Telephone Encounter (Signed)
Patient called asking for more PT, she said she believes she should be walking without a can/walker. CB # [phone]

## 2017-01-21 NOTE — Telephone Encounter (Signed)
I called and spoke with patient she is a bilateral amputee. Most recent surgery 10/2015. She wants to walk outside of her home with prosthetics, she would like to gain confidence in her gait. She feels unsteady. She would like additional physical therapy. Advised her Shirlean Mylar is the best physical therapist for this, she is excellent at what she does and order is placed.

## 2017-01-21 NOTE — Addendum Note (Signed)
Addended by: Maxcine Ham on: 01/21/2017 03:33 PM   Modules accepted: Orders

## 2017-01-30 ENCOUNTER — Other Ambulatory Visit: Payer: Self-pay | Admitting: Internal Medicine

## 2017-02-03 ENCOUNTER — Other Ambulatory Visit: Payer: Self-pay | Admitting: Internal Medicine

## 2017-03-11 ENCOUNTER — Ambulatory Visit: Payer: Medicare Other | Admitting: Internal Medicine

## 2017-03-30 DIAGNOSIS — N186 End stage renal disease: Secondary | ICD-10-CM | POA: Diagnosis not present

## 2017-03-30 DIAGNOSIS — T82858A Stenosis of vascular prosthetic devices, implants and grafts, initial encounter: Secondary | ICD-10-CM | POA: Diagnosis not present

## 2017-03-30 DIAGNOSIS — Z992 Dependence on renal dialysis: Secondary | ICD-10-CM | POA: Diagnosis not present

## 2017-03-30 DIAGNOSIS — I871 Compression of vein: Secondary | ICD-10-CM | POA: Diagnosis not present

## 2017-03-31 ENCOUNTER — Other Ambulatory Visit: Payer: Self-pay | Admitting: Internal Medicine

## 2017-04-01 ENCOUNTER — Other Ambulatory Visit: Payer: Self-pay | Admitting: Nurse Practitioner

## 2017-04-01 NOTE — Telephone Encounter (Signed)
This is Dr. Berry's pt 

## 2017-04-06 ENCOUNTER — Encounter: Payer: Self-pay | Admitting: Internal Medicine

## 2017-04-06 ENCOUNTER — Other Ambulatory Visit (INDEPENDENT_AMBULATORY_CARE_PROVIDER_SITE_OTHER): Payer: BC Managed Care – PPO

## 2017-04-06 ENCOUNTER — Ambulatory Visit (INDEPENDENT_AMBULATORY_CARE_PROVIDER_SITE_OTHER): Payer: BC Managed Care – PPO | Admitting: Internal Medicine

## 2017-04-06 VITALS — BP 156/72 | HR 75 | Ht 59.0 in | Wt 131.0 lb

## 2017-04-06 DIAGNOSIS — E1152 Type 2 diabetes mellitus with diabetic peripheral angiopathy with gangrene: Secondary | ICD-10-CM

## 2017-04-06 DIAGNOSIS — N186 End stage renal disease: Secondary | ICD-10-CM

## 2017-04-06 DIAGNOSIS — D638 Anemia in other chronic diseases classified elsewhere: Secondary | ICD-10-CM | POA: Diagnosis not present

## 2017-04-06 DIAGNOSIS — Z992 Dependence on renal dialysis: Secondary | ICD-10-CM

## 2017-04-06 DIAGNOSIS — I10 Essential (primary) hypertension: Secondary | ICD-10-CM | POA: Diagnosis not present

## 2017-04-06 LAB — HEPATIC FUNCTION PANEL
ALT: 12 U/L (ref 0–35)
AST: 17 U/L (ref 0–37)
Albumin: 4.1 g/dL (ref 3.5–5.2)
Alkaline Phosphatase: 91 U/L (ref 39–117)
Bilirubin, Direct: 0 mg/dL (ref 0.0–0.3)
Total Bilirubin: 0.4 mg/dL (ref 0.2–1.2)
Total Protein: 7.6 g/dL (ref 6.0–8.3)

## 2017-04-06 LAB — BASIC METABOLIC PANEL
BUN: 35 mg/dL — ABNORMAL HIGH (ref 6–23)
CO2: 34 mEq/L — ABNORMAL HIGH (ref 19–32)
Calcium: 9.9 mg/dL (ref 8.4–10.5)
Chloride: 93 mEq/L — ABNORMAL LOW (ref 96–112)
Creatinine, Ser: 5.95 mg/dL (ref 0.40–1.20)
GFR: 9.14 mL/min — CL (ref >60.00)
Glucose, Bld: 191 mg/dL — ABNORMAL HIGH (ref 70–99)
Potassium: 4.5 mEq/L (ref 3.5–5.1)
Sodium: 137 mEq/L (ref 135–145)

## 2017-04-06 LAB — CBC WITH DIFFERENTIAL/PLATELET
Basophils Absolute: 0.1 10*3/uL (ref 0.0–0.1)
Basophils Relative: 0.8 % (ref 0.0–3.0)
Eosinophils Absolute: 0.2 10*3/uL (ref 0.0–0.7)
Eosinophils Relative: 3.3 % (ref 0.0–5.0)
HCT: 33.5 % — ABNORMAL LOW (ref 36.0–46.0)
Hemoglobin: 11.1 g/dL — ABNORMAL LOW (ref 12.0–15.0)
Lymphocytes Relative: 38 % (ref 12.0–46.0)
Lymphs Abs: 2.5 10*3/uL (ref 0.7–4.0)
MCHC: 33.1 g/dL (ref 30.0–36.0)
MCV: 96.5 fl (ref 78.0–100.0)
Monocytes Absolute: 0.6 10*3/uL (ref 0.1–1.0)
Monocytes Relative: 9.3 % (ref 3.0–12.0)
Neutro Abs: 3.2 10*3/uL (ref 1.4–7.7)
Neutrophils Relative %: 48.6 % (ref 43.0–77.0)
Platelets: 269 10*3/uL (ref 150.0–400.0)
RBC: 3.47 Mil/uL — ABNORMAL LOW (ref 3.87–5.11)
RDW: 17.5 % — ABNORMAL HIGH (ref 11.5–15.5)
WBC: 6.6 10*3/uL (ref 4.0–10.5)

## 2017-04-06 LAB — LIPID PANEL
Cholesterol: 181 mg/dL (ref 0–200)
HDL: 45.4 mg/dL (ref >39.00)
NonHDL: 135.2
Total CHOL/HDL Ratio: 4
Triglycerides: 214 mg/dL — ABNORMAL HIGH (ref 0.0–149.0)
VLDL: 42.8 mg/dL — ABNORMAL HIGH (ref 0.0–40.0)

## 2017-04-06 LAB — HEMOGLOBIN A1C: Hgb A1c MFr Bld: 7.5 % — ABNORMAL HIGH (ref 4.6–6.5)

## 2017-04-06 LAB — LDL CHOLESTEROL, DIRECT: Direct LDL: 86 mg/dL

## 2017-04-06 MED ORDER — LOSARTAN POTASSIUM 100 MG PO TABS: 100.0000 mg | ORAL_TABLET | Freq: Every day | ORAL | 3 refills | Status: DC

## 2017-04-06 MED ORDER — LIDOCAINE-PRILOCAINE 2.5-2.5 % EX CREA: TOPICAL_CREAM | CUTANEOUS | 5 refills | Status: DC

## 2017-04-06 MED ORDER — GLUCOSE BLOOD VI STRP: ORAL_STRIP | 12 refills | Status: DC

## 2017-04-06 NOTE — Patient Instructions (Signed)
Please take all new medication as prescribed - the losartan 100 mg per day for blood pressure  Please continue all other medications as before, and refills have been done if requested.  Please have the pharmacy call with any other refills you may need.  Please continue your efforts at being more active, low cholesterol diet, and weight control.  Please keep your appointments with your specialists as you may have planned  Please go to the LAB in the Basement (turn left off the elevator) for the tests to be done today  You will be contacted by phone if any changes need to be made immediately.  Otherwise, you will receive a letter about your results with an explanation, but please check with MyChart first.  Please remember to sign up for MyChart if you have not done so, as this will be important to you in the future with finding out test results, communicating by private email, and scheduling acute appointments online when needed.  Your transportation form was signed  Please return in 6 months, or sooner if needed

## 2017-04-06 NOTE — Progress Notes (Signed)
Subjective:    Patient ID: Carolyn Valenzuela, female    DOB: 18-Jun-1951, 66 y.o.   MRN: [id number]  HPI  Here to f/u; overall doing ok,  Pt denies chest pain, increasing sob or doe, wheezing, orthopnea, PND, increased LE swelling, palpitations, dizziness or syncope.  Pt denies new neurological symptoms such as new headache, or facial or extremity weakness or numbness.  Pt denies polydipsia, polyuria, or low sugar episode.  Pt states overall good compliance with meds, mostly trying to follow appropriate diet, with wt overall stable Wt Readings from Last 3 Encounters:  04/06/17 131 lb (59.4 kg)  01/05/17 128 lb (58.1 kg)  09/29/16 123 lb (55.8 kg)   BP Readings from Last 3 Encounters:  04/06/17 (!) 156/72  01/05/17 (!) 152/74  09/29/16 (!) 179/80  CBG was low at 64 this am, ate bfast and forgot to take am med today as she had to get to her bus transport on time.  States BP at home usually < 140/90, and higher today she thinks due to the low sugar.  Am sugars oftern 80's or higher (up to rare 245 with diet indiscretion the night before);  PM sugars usually ave 130-180 with some higher and lower.  Still does HD M-W-F.   Having some benign forgetfulness memory issue as well, but always seems to remember things later.   Past Medical History:  Diagnosis Date  . Allergic rhinitis, cause unspecified 12/08/2010  . ANEMIA-IRON DEFICIENCY 01/25/2008  . Aortic regurgitation   . CAD (coronary artery disease)    a. 07/2016 NSTEMI/PCI: LM nl, LAD 70ost, 33m/d, 80d, D1 40ost, LCX 99ost/p (2.5x16 Synergy DES - 2.75), 40/37m, Om2 100 CTO, RCA  30p/m. Pt eval by CT surgery, not felt to be suitable candidate 2/2 comorbidities.  . CERVICAL RADICULOPATHY, LEFT 01/25/2008  . Chronic systolic CHF (congestive heart failure) ([place])    a. 07/2016 Echo: EF 40-45%, diff HK, sev AI, mild MVP w/ mod to sev MR, PASP 49mmHg.  . Critical lower limb ischemia   . DIABETES MELLITUS, UNCONTROLLED 05/21/2009  . Esophagitis   . ESRD  on dialysis ([place])   . Foot ulcer ([place])    right lateral malleolus  . HCAP (healthcare-associated pneumonia) 06/29/2016  . HYPERLIPIDEMIA 03/30/2007  . HYPERTENSION 01/25/2008  . Ischemic cardiomyopathy    a. 07/2016 Echo: EF 40-45%.  . Mitral regurgitation    a. 07/2016 Echo: Mild MVP w/ mod to sev MR.  [name] PVD (peripheral vascular disease) ([place])    a. s/p bilat BKA  . SECONDARY HYPERPARATHYROIDISM 05/21/2009  . Severe aortic regurgitation    a. 07/2016 Echo: Severe AI.   Past Surgical History:  Procedure Laterality Date  . AMPUTATION Right 03/15/2014   Procedure: RIGHT  LEG  BELOW KNEE AMPUTATION ;  Surgeon: [name], MD;  Location: [facility];  Service: Orthopedics;  Laterality: Right;  . AMPUTATION Left 11/29/2015   Procedure: AMPUTATION BELOW KNEE;  Surgeon: Newt Minion, MD;  Location: [facility];  Service: Orthopedics;  Laterality: Left;  . AV FISTULA PLACEMENT Left   . AV FISTULA PLACEMENT Left 09/06/2014   Procedure: ARTERIOVENOUS (AV) FISTULA CREATION-Left Brachiocephalic;  Surgeon: Conrad P[place]uff, MD;  Location: [facility];  Service: Vascular;  Laterality: Left;  . CARDIAC CATHETERIZATION N/A 06/30/2016   Procedure: Left Heart Cath and Coronary Angiography;  Surgeon: Burnell Blanks, MD;  Location: [facility];  Service: Cardiovascular;  Laterality: N/A;  . CARDIAC CATHETERIZATION N/A 07/02/2016   Procedure:  Coronary Stent Intervention;  Surgeon: Nelva Bush, MD;  Location: [facility];  Service: Cardiovascular;  Laterality: N/A;  . COLONOSCOPY    . CORONARY ANGIOPLASTY WITH STENT PLACEMENT  07/02/2016  . ESOPHAGOGASTRODUODENOSCOPY N/A 03/20/2016   Procedure: ESOPHAGOGASTRODUODENOSCOPY (EGD);  Surgeon: Milus Banister, MD;  Location: [facility];  Service: Endoscopy;  Laterality: N/A;  . EYE SURGERY Bilateral    cataracts removed, left eye still has some oil in it.  . INSERTION OF DIALYSIS CATHETER N/A 09/03/2014   Procedure: INSERTION OF DIALYSIS CATHETER RIGHT INTERNAL  JUGULAR VEIN;  Surgeon: Conrad [place], MD;  Location: [facility];  Service: Vascular;  Laterality: N/A;  . PERIPHERAL VASCULAR CATHETERIZATION N/A 08/13/2015   Procedure: Abdominal Aortogram;  Surgeon: Elam Dutch, MD;  Location: [facility];  Service: Cardiovascular;  Laterality: N/A;    reports that she has never smoked. She has never used smokeless tobacco. She reports that she does not drink alcohol or use drugs. family history includes Cancer in her mother. Allergies  Allergen Reactions  . Oxycodone Itching   Current Outpatient Prescriptions on File Prior to Visit  Medication Sig Dispense Refill  . aspirin 81 MG EC tablet Take 81 mg by mouth daily.      . clopidogrel (PLAVIX) 75 MG tablet Take 1 tablet (75 mg total) by mouth daily. 90 tablet 3  . CVS ACID REDUCER 10 MG tablet TAKE 1 TABLET (10 MG TOTAL) BY MOUTH DAILY. 30 tablet 1  . gabapentin (NEURONTIN) 100 MG capsule Take 2 capsules (200 mg total) by mouth 3 (three) times daily. 540 capsule 1  . glipiZIDE (GLUCOTROL XL) 10 MG 24 hr tablet TAKE 1 TABLET BY MOUTH DAILY WITH BREAKFAST. 90 tablet 3  . multivitamin (RENA-VIT) TABS tablet TAKE 1 TABLET BY MOUTH EVERY DAY AT BEDTIME 30 tablet 11  . pantoprazole (PROTONIX) 40 MG tablet TAKE 1 TABLET BY MOUTH TWICE A DAY BEFORE MEALS 60 tablet 5  . sevelamer carbonate (RENVELA) 800 MG tablet Take 1,600 mg by mouth 3 (three) times daily with meals.     No current facility-administered medications on file prior to visit.    Review of Systems  Constitutional: Negative for other unusual diaphoresis or sweats HENT: Negative for ear discharge or swelling Eyes: Negative for other worsening visual disturbances Respiratory: Negative for stridor or other swelling  Gastrointestinal: Negative for worsening distension or other blood Genitourinary: Negative for retention or other urinary change Musculoskeletal: Negative for other MSK pain or swelling Skin: Negative for color change or other  new lesions Neurological: Negative for worsening tremors and other numbness  Psychiatric/Behavioral: Negative for worsening agitation or other fatigue All other system neg per pt    Objective:   Physical Exam BP (!) 156/72   Pulse 75   Ht 4\' 11"  (1.499 m)   Wt 131 lb (59.4 kg)   SpO2 97%   BMI 26.46 kg/m  VS noted,  Constitutional: Pt appears in NAD HENT: Head: NCAT.  Right Ear: External ear normal.  Left Ear: External ear normal.  Eyes: . Pupils are equal, round, and reactive to light. Conjunctivae and EOM are normal Nose: without d/c or deformity Neck: Neck supple. Gross normal ROM Cardiovascular: Normal rate and regular rhythm.   Pulmonary/Chest: Effort normal and breath sounds without rales or wheezing.  + thrill LUE Neurological: Pt is alert. At baseline orientation, motor grossly intact Skin: Skin is warm. No rashes, other new lesions, no LE edema Psychiatric: Pt behavior is  normal without agitation  S/p bilat BKA.  Lab Results  Component Value Date   WBC 6.6 04/06/2017   HGB 11.1 (L) 04/06/2017   HCT 33.5 (L) 04/06/2017   PLT 269.0 04/06/2017   GLUCOSE 191 (H) 04/06/2017   CHOL 181 04/06/2017   TRIG 214.0 (H) 04/06/2017   HDL 45.40 04/06/2017   LDLDIRECT 86.0 04/06/2017   LDLCALC 58 09/11/2016   ALT 12 04/06/2017   AST 17 04/06/2017   NA 137 04/06/2017   K 4.5 04/06/2017   CL 93 (L) 04/06/2017   CREATININE 5.95 (HH) 04/06/2017   BUN 35 (H) 04/06/2017   CO2 34 (H) 04/06/2017   TSH 2.11 09/11/2016   INR 1.06 06/30/2016   HGBA1C 7.5 (H) 04/06/2017   MICROALBUR 108.1 (H) 09/15/2016       Assessment & Plan:

## 2017-04-09 NOTE — Assessment & Plan Note (Signed)
Uncontrolled, has hx of ESRD but ok for losartan 100 qd, cont all other tx,  to f/u any worsening symptoms or concerns

## 2017-04-09 NOTE — Assessment & Plan Note (Signed)
stable overall by history and exam, recent data reviewed with pt, and pt to continue medical treatment as before,  to f/u any worsening symptoms or concerns Lab Results  Component Value Date   HGBA1C 7.5 (H) 04/06/2017

## 2017-04-09 NOTE — Assessment & Plan Note (Signed)
To continue HD,  to f/u any worsening symptoms or concerns

## 2017-04-09 NOTE — Assessment & Plan Note (Signed)
stable overall by history and exam, recent data reviewed with pt, and pt to continue medical treatment as before,  to f/u any worsening symptoms or concerns  

## 2017-05-31 DIAGNOSIS — N2581 Secondary hyperparathyroidism of renal origin: Secondary | ICD-10-CM | POA: Diagnosis not present

## 2017-05-31 DIAGNOSIS — Z23 Encounter for immunization: Secondary | ICD-10-CM | POA: Diagnosis not present

## 2017-05-31 DIAGNOSIS — E1122 Type 2 diabetes mellitus with diabetic chronic kidney disease: Secondary | ICD-10-CM | POA: Diagnosis not present

## 2017-05-31 DIAGNOSIS — E876 Hypokalemia: Secondary | ICD-10-CM | POA: Diagnosis not present

## 2017-05-31 DIAGNOSIS — N186 End stage renal disease: Secondary | ICD-10-CM | POA: Diagnosis not present

## 2017-06-02 DIAGNOSIS — N186 End stage renal disease: Secondary | ICD-10-CM | POA: Diagnosis not present

## 2017-06-02 DIAGNOSIS — Z23 Encounter for immunization: Secondary | ICD-10-CM | POA: Diagnosis not present

## 2017-06-02 DIAGNOSIS — E876 Hypokalemia: Secondary | ICD-10-CM | POA: Diagnosis not present

## 2017-06-02 DIAGNOSIS — E1122 Type 2 diabetes mellitus with diabetic chronic kidney disease: Secondary | ICD-10-CM | POA: Diagnosis not present

## 2017-06-02 DIAGNOSIS — N2581 Secondary hyperparathyroidism of renal origin: Secondary | ICD-10-CM | POA: Diagnosis not present

## 2017-06-04 DIAGNOSIS — N2581 Secondary hyperparathyroidism of renal origin: Secondary | ICD-10-CM | POA: Diagnosis not present

## 2017-06-04 DIAGNOSIS — E876 Hypokalemia: Secondary | ICD-10-CM | POA: Diagnosis not present

## 2017-06-04 DIAGNOSIS — N186 End stage renal disease: Secondary | ICD-10-CM | POA: Diagnosis not present

## 2017-06-04 DIAGNOSIS — Z23 Encounter for immunization: Secondary | ICD-10-CM | POA: Diagnosis not present

## 2017-06-04 DIAGNOSIS — E1122 Type 2 diabetes mellitus with diabetic chronic kidney disease: Secondary | ICD-10-CM | POA: Diagnosis not present

## 2017-06-07 DIAGNOSIS — E876 Hypokalemia: Secondary | ICD-10-CM | POA: Diagnosis not present

## 2017-06-07 DIAGNOSIS — E1122 Type 2 diabetes mellitus with diabetic chronic kidney disease: Secondary | ICD-10-CM | POA: Diagnosis not present

## 2017-06-07 DIAGNOSIS — N2581 Secondary hyperparathyroidism of renal origin: Secondary | ICD-10-CM | POA: Diagnosis not present

## 2017-06-07 DIAGNOSIS — Z23 Encounter for immunization: Secondary | ICD-10-CM | POA: Diagnosis not present

## 2017-06-07 DIAGNOSIS — N186 End stage renal disease: Secondary | ICD-10-CM | POA: Diagnosis not present

## 2017-06-09 DIAGNOSIS — N186 End stage renal disease: Secondary | ICD-10-CM | POA: Diagnosis not present

## 2017-06-09 DIAGNOSIS — N2581 Secondary hyperparathyroidism of renal origin: Secondary | ICD-10-CM | POA: Diagnosis not present

## 2017-06-09 DIAGNOSIS — E1122 Type 2 diabetes mellitus with diabetic chronic kidney disease: Secondary | ICD-10-CM | POA: Diagnosis not present

## 2017-06-09 DIAGNOSIS — E876 Hypokalemia: Secondary | ICD-10-CM | POA: Diagnosis not present

## 2017-06-09 DIAGNOSIS — Z23 Encounter for immunization: Secondary | ICD-10-CM | POA: Diagnosis not present

## 2017-06-11 DIAGNOSIS — N2581 Secondary hyperparathyroidism of renal origin: Secondary | ICD-10-CM | POA: Diagnosis not present

## 2017-06-11 DIAGNOSIS — E1122 Type 2 diabetes mellitus with diabetic chronic kidney disease: Secondary | ICD-10-CM | POA: Diagnosis not present

## 2017-06-11 DIAGNOSIS — Z23 Encounter for immunization: Secondary | ICD-10-CM | POA: Diagnosis not present

## 2017-06-11 DIAGNOSIS — E876 Hypokalemia: Secondary | ICD-10-CM | POA: Diagnosis not present

## 2017-06-11 DIAGNOSIS — N186 End stage renal disease: Secondary | ICD-10-CM | POA: Diagnosis not present

## 2017-06-14 DIAGNOSIS — N2581 Secondary hyperparathyroidism of renal origin: Secondary | ICD-10-CM | POA: Diagnosis not present

## 2017-06-14 DIAGNOSIS — Z23 Encounter for immunization: Secondary | ICD-10-CM | POA: Diagnosis not present

## 2017-06-14 DIAGNOSIS — N186 End stage renal disease: Secondary | ICD-10-CM | POA: Diagnosis not present

## 2017-06-14 DIAGNOSIS — E876 Hypokalemia: Secondary | ICD-10-CM | POA: Diagnosis not present

## 2017-06-14 DIAGNOSIS — E1122 Type 2 diabetes mellitus with diabetic chronic kidney disease: Secondary | ICD-10-CM | POA: Diagnosis not present

## 2017-06-16 DIAGNOSIS — N2581 Secondary hyperparathyroidism of renal origin: Secondary | ICD-10-CM | POA: Diagnosis not present

## 2017-06-16 DIAGNOSIS — Z23 Encounter for immunization: Secondary | ICD-10-CM | POA: Diagnosis not present

## 2017-06-16 DIAGNOSIS — E1122 Type 2 diabetes mellitus with diabetic chronic kidney disease: Secondary | ICD-10-CM | POA: Diagnosis not present

## 2017-06-16 DIAGNOSIS — N186 End stage renal disease: Secondary | ICD-10-CM | POA: Diagnosis not present

## 2017-06-16 DIAGNOSIS — E876 Hypokalemia: Secondary | ICD-10-CM | POA: Diagnosis not present

## 2017-06-18 DIAGNOSIS — N186 End stage renal disease: Secondary | ICD-10-CM | POA: Diagnosis not present

## 2017-06-18 DIAGNOSIS — E1122 Type 2 diabetes mellitus with diabetic chronic kidney disease: Secondary | ICD-10-CM | POA: Diagnosis not present

## 2017-06-18 DIAGNOSIS — N2581 Secondary hyperparathyroidism of renal origin: Secondary | ICD-10-CM | POA: Diagnosis not present

## 2017-06-18 DIAGNOSIS — Z23 Encounter for immunization: Secondary | ICD-10-CM | POA: Diagnosis not present

## 2017-06-18 DIAGNOSIS — E876 Hypokalemia: Secondary | ICD-10-CM | POA: Diagnosis not present

## 2017-06-21 DIAGNOSIS — Z23 Encounter for immunization: Secondary | ICD-10-CM | POA: Diagnosis not present

## 2017-06-21 DIAGNOSIS — N186 End stage renal disease: Secondary | ICD-10-CM | POA: Diagnosis not present

## 2017-06-21 DIAGNOSIS — N2581 Secondary hyperparathyroidism of renal origin: Secondary | ICD-10-CM | POA: Diagnosis not present

## 2017-06-21 DIAGNOSIS — E1122 Type 2 diabetes mellitus with diabetic chronic kidney disease: Secondary | ICD-10-CM | POA: Diagnosis not present

## 2017-06-21 DIAGNOSIS — E876 Hypokalemia: Secondary | ICD-10-CM | POA: Diagnosis not present

## 2017-06-23 DIAGNOSIS — N186 End stage renal disease: Secondary | ICD-10-CM | POA: Diagnosis not present

## 2017-06-23 DIAGNOSIS — E876 Hypokalemia: Secondary | ICD-10-CM | POA: Diagnosis not present

## 2017-06-23 DIAGNOSIS — Z23 Encounter for immunization: Secondary | ICD-10-CM | POA: Diagnosis not present

## 2017-06-23 DIAGNOSIS — N2581 Secondary hyperparathyroidism of renal origin: Secondary | ICD-10-CM | POA: Diagnosis not present

## 2017-06-23 DIAGNOSIS — E1122 Type 2 diabetes mellitus with diabetic chronic kidney disease: Secondary | ICD-10-CM | POA: Diagnosis not present

## 2017-06-25 DIAGNOSIS — Z23 Encounter for immunization: Secondary | ICD-10-CM | POA: Diagnosis not present

## 2017-06-25 DIAGNOSIS — E1122 Type 2 diabetes mellitus with diabetic chronic kidney disease: Secondary | ICD-10-CM | POA: Diagnosis not present

## 2017-06-25 DIAGNOSIS — N2581 Secondary hyperparathyroidism of renal origin: Secondary | ICD-10-CM | POA: Diagnosis not present

## 2017-06-25 DIAGNOSIS — E876 Hypokalemia: Secondary | ICD-10-CM | POA: Diagnosis not present

## 2017-06-25 DIAGNOSIS — N186 End stage renal disease: Secondary | ICD-10-CM | POA: Diagnosis not present

## 2017-06-28 DIAGNOSIS — E876 Hypokalemia: Secondary | ICD-10-CM | POA: Diagnosis not present

## 2017-06-28 DIAGNOSIS — N186 End stage renal disease: Secondary | ICD-10-CM | POA: Diagnosis not present

## 2017-06-28 DIAGNOSIS — E1122 Type 2 diabetes mellitus with diabetic chronic kidney disease: Secondary | ICD-10-CM | POA: Diagnosis not present

## 2017-06-28 DIAGNOSIS — Z23 Encounter for immunization: Secondary | ICD-10-CM | POA: Diagnosis not present

## 2017-06-28 DIAGNOSIS — N2581 Secondary hyperparathyroidism of renal origin: Secondary | ICD-10-CM | POA: Diagnosis not present

## 2017-06-30 DIAGNOSIS — Z992 Dependence on renal dialysis: Secondary | ICD-10-CM | POA: Diagnosis not present

## 2017-06-30 DIAGNOSIS — E1129 Type 2 diabetes mellitus with other diabetic kidney complication: Secondary | ICD-10-CM | POA: Diagnosis not present

## 2017-06-30 DIAGNOSIS — N186 End stage renal disease: Secondary | ICD-10-CM | POA: Diagnosis not present

## 2017-06-30 DIAGNOSIS — E876 Hypokalemia: Secondary | ICD-10-CM | POA: Diagnosis not present

## 2017-06-30 DIAGNOSIS — E1122 Type 2 diabetes mellitus with diabetic chronic kidney disease: Secondary | ICD-10-CM | POA: Diagnosis not present

## 2017-06-30 DIAGNOSIS — Z23 Encounter for immunization: Secondary | ICD-10-CM | POA: Diagnosis not present

## 2017-06-30 DIAGNOSIS — N2581 Secondary hyperparathyroidism of renal origin: Secondary | ICD-10-CM | POA: Diagnosis not present

## 2017-07-02 DIAGNOSIS — E1122 Type 2 diabetes mellitus with diabetic chronic kidney disease: Secondary | ICD-10-CM | POA: Diagnosis not present

## 2017-07-02 DIAGNOSIS — N186 End stage renal disease: Secondary | ICD-10-CM | POA: Diagnosis not present

## 2017-07-02 DIAGNOSIS — N2581 Secondary hyperparathyroidism of renal origin: Secondary | ICD-10-CM | POA: Diagnosis not present

## 2017-07-02 DIAGNOSIS — E876 Hypokalemia: Secondary | ICD-10-CM | POA: Diagnosis not present

## 2017-07-02 DIAGNOSIS — D631 Anemia in chronic kidney disease: Secondary | ICD-10-CM | POA: Diagnosis not present

## 2017-07-05 DIAGNOSIS — D631 Anemia in chronic kidney disease: Secondary | ICD-10-CM | POA: Diagnosis not present

## 2017-07-05 DIAGNOSIS — N2581 Secondary hyperparathyroidism of renal origin: Secondary | ICD-10-CM | POA: Diagnosis not present

## 2017-07-05 DIAGNOSIS — N186 End stage renal disease: Secondary | ICD-10-CM | POA: Diagnosis not present

## 2017-07-05 DIAGNOSIS — E1122 Type 2 diabetes mellitus with diabetic chronic kidney disease: Secondary | ICD-10-CM | POA: Diagnosis not present

## 2017-07-05 DIAGNOSIS — E876 Hypokalemia: Secondary | ICD-10-CM | POA: Diagnosis not present

## 2017-07-07 DIAGNOSIS — D631 Anemia in chronic kidney disease: Secondary | ICD-10-CM | POA: Diagnosis not present

## 2017-07-07 DIAGNOSIS — N2581 Secondary hyperparathyroidism of renal origin: Secondary | ICD-10-CM | POA: Diagnosis not present

## 2017-07-07 DIAGNOSIS — N186 End stage renal disease: Secondary | ICD-10-CM | POA: Diagnosis not present

## 2017-07-07 DIAGNOSIS — E1122 Type 2 diabetes mellitus with diabetic chronic kidney disease: Secondary | ICD-10-CM | POA: Diagnosis not present

## 2017-07-07 DIAGNOSIS — E876 Hypokalemia: Secondary | ICD-10-CM | POA: Diagnosis not present

## 2017-07-09 DIAGNOSIS — D631 Anemia in chronic kidney disease: Secondary | ICD-10-CM | POA: Diagnosis not present

## 2017-07-09 DIAGNOSIS — N186 End stage renal disease: Secondary | ICD-10-CM | POA: Diagnosis not present

## 2017-07-09 DIAGNOSIS — E1122 Type 2 diabetes mellitus with diabetic chronic kidney disease: Secondary | ICD-10-CM | POA: Diagnosis not present

## 2017-07-09 DIAGNOSIS — N2581 Secondary hyperparathyroidism of renal origin: Secondary | ICD-10-CM | POA: Diagnosis not present

## 2017-07-09 DIAGNOSIS — E876 Hypokalemia: Secondary | ICD-10-CM | POA: Diagnosis not present

## 2017-07-12 DIAGNOSIS — E1122 Type 2 diabetes mellitus with diabetic chronic kidney disease: Secondary | ICD-10-CM | POA: Diagnosis not present

## 2017-07-12 DIAGNOSIS — E876 Hypokalemia: Secondary | ICD-10-CM | POA: Diagnosis not present

## 2017-07-12 DIAGNOSIS — N186 End stage renal disease: Secondary | ICD-10-CM | POA: Diagnosis not present

## 2017-07-12 DIAGNOSIS — N2581 Secondary hyperparathyroidism of renal origin: Secondary | ICD-10-CM | POA: Diagnosis not present

## 2017-07-12 DIAGNOSIS — D631 Anemia in chronic kidney disease: Secondary | ICD-10-CM | POA: Diagnosis not present

## 2017-07-14 DIAGNOSIS — N2581 Secondary hyperparathyroidism of renal origin: Secondary | ICD-10-CM | POA: Diagnosis not present

## 2017-07-14 DIAGNOSIS — E1122 Type 2 diabetes mellitus with diabetic chronic kidney disease: Secondary | ICD-10-CM | POA: Diagnosis not present

## 2017-07-14 DIAGNOSIS — N186 End stage renal disease: Secondary | ICD-10-CM | POA: Diagnosis not present

## 2017-07-14 DIAGNOSIS — E876 Hypokalemia: Secondary | ICD-10-CM | POA: Diagnosis not present

## 2017-07-14 DIAGNOSIS — D631 Anemia in chronic kidney disease: Secondary | ICD-10-CM | POA: Diagnosis not present

## 2017-07-16 DIAGNOSIS — N186 End stage renal disease: Secondary | ICD-10-CM | POA: Diagnosis not present

## 2017-07-16 DIAGNOSIS — E876 Hypokalemia: Secondary | ICD-10-CM | POA: Diagnosis not present

## 2017-07-16 DIAGNOSIS — D631 Anemia in chronic kidney disease: Secondary | ICD-10-CM | POA: Diagnosis not present

## 2017-07-16 DIAGNOSIS — N2581 Secondary hyperparathyroidism of renal origin: Secondary | ICD-10-CM | POA: Diagnosis not present

## 2017-07-16 DIAGNOSIS — E1122 Type 2 diabetes mellitus with diabetic chronic kidney disease: Secondary | ICD-10-CM | POA: Diagnosis not present

## 2017-07-18 DIAGNOSIS — E1122 Type 2 diabetes mellitus with diabetic chronic kidney disease: Secondary | ICD-10-CM | POA: Diagnosis not present

## 2017-07-18 DIAGNOSIS — N2581 Secondary hyperparathyroidism of renal origin: Secondary | ICD-10-CM | POA: Diagnosis not present

## 2017-07-18 DIAGNOSIS — E876 Hypokalemia: Secondary | ICD-10-CM | POA: Diagnosis not present

## 2017-07-18 DIAGNOSIS — D631 Anemia in chronic kidney disease: Secondary | ICD-10-CM | POA: Diagnosis not present

## 2017-07-18 DIAGNOSIS — N186 End stage renal disease: Secondary | ICD-10-CM | POA: Diagnosis not present

## 2017-07-20 DIAGNOSIS — N2581 Secondary hyperparathyroidism of renal origin: Secondary | ICD-10-CM | POA: Diagnosis not present

## 2017-07-20 DIAGNOSIS — N186 End stage renal disease: Secondary | ICD-10-CM | POA: Diagnosis not present

## 2017-07-20 DIAGNOSIS — D631 Anemia in chronic kidney disease: Secondary | ICD-10-CM | POA: Diagnosis not present

## 2017-07-20 DIAGNOSIS — E876 Hypokalemia: Secondary | ICD-10-CM | POA: Diagnosis not present

## 2017-07-20 DIAGNOSIS — E1122 Type 2 diabetes mellitus with diabetic chronic kidney disease: Secondary | ICD-10-CM | POA: Diagnosis not present

## 2017-07-23 DIAGNOSIS — N2581 Secondary hyperparathyroidism of renal origin: Secondary | ICD-10-CM | POA: Diagnosis not present

## 2017-07-23 DIAGNOSIS — D631 Anemia in chronic kidney disease: Secondary | ICD-10-CM | POA: Diagnosis not present

## 2017-07-23 DIAGNOSIS — N186 End stage renal disease: Secondary | ICD-10-CM | POA: Diagnosis not present

## 2017-07-23 DIAGNOSIS — E1122 Type 2 diabetes mellitus with diabetic chronic kidney disease: Secondary | ICD-10-CM | POA: Diagnosis not present

## 2017-07-23 DIAGNOSIS — E876 Hypokalemia: Secondary | ICD-10-CM | POA: Diagnosis not present

## 2017-07-26 DIAGNOSIS — E1122 Type 2 diabetes mellitus with diabetic chronic kidney disease: Secondary | ICD-10-CM | POA: Diagnosis not present

## 2017-07-26 DIAGNOSIS — D631 Anemia in chronic kidney disease: Secondary | ICD-10-CM | POA: Diagnosis not present

## 2017-07-26 DIAGNOSIS — N2581 Secondary hyperparathyroidism of renal origin: Secondary | ICD-10-CM | POA: Diagnosis not present

## 2017-07-26 DIAGNOSIS — N186 End stage renal disease: Secondary | ICD-10-CM | POA: Diagnosis not present

## 2017-07-26 DIAGNOSIS — E876 Hypokalemia: Secondary | ICD-10-CM | POA: Diagnosis not present

## 2017-07-28 DIAGNOSIS — E876 Hypokalemia: Secondary | ICD-10-CM | POA: Diagnosis not present

## 2017-07-28 DIAGNOSIS — D631 Anemia in chronic kidney disease: Secondary | ICD-10-CM | POA: Diagnosis not present

## 2017-07-28 DIAGNOSIS — N186 End stage renal disease: Secondary | ICD-10-CM | POA: Diagnosis not present

## 2017-07-28 DIAGNOSIS — N2581 Secondary hyperparathyroidism of renal origin: Secondary | ICD-10-CM | POA: Diagnosis not present

## 2017-07-28 DIAGNOSIS — E1122 Type 2 diabetes mellitus with diabetic chronic kidney disease: Secondary | ICD-10-CM | POA: Diagnosis not present

## 2017-07-30 DIAGNOSIS — Z992 Dependence on renal dialysis: Secondary | ICD-10-CM | POA: Diagnosis not present

## 2017-07-30 DIAGNOSIS — N2581 Secondary hyperparathyroidism of renal origin: Secondary | ICD-10-CM | POA: Diagnosis not present

## 2017-07-30 DIAGNOSIS — E1129 Type 2 diabetes mellitus with other diabetic kidney complication: Secondary | ICD-10-CM | POA: Diagnosis not present

## 2017-07-30 DIAGNOSIS — E1122 Type 2 diabetes mellitus with diabetic chronic kidney disease: Secondary | ICD-10-CM | POA: Diagnosis not present

## 2017-07-30 DIAGNOSIS — E876 Hypokalemia: Secondary | ICD-10-CM | POA: Diagnosis not present

## 2017-07-30 DIAGNOSIS — D631 Anemia in chronic kidney disease: Secondary | ICD-10-CM | POA: Diagnosis not present

## 2017-07-30 DIAGNOSIS — N186 End stage renal disease: Secondary | ICD-10-CM | POA: Diagnosis not present

## 2017-08-02 DIAGNOSIS — N2581 Secondary hyperparathyroidism of renal origin: Secondary | ICD-10-CM | POA: Diagnosis not present

## 2017-08-02 DIAGNOSIS — D631 Anemia in chronic kidney disease: Secondary | ICD-10-CM | POA: Diagnosis not present

## 2017-08-02 DIAGNOSIS — E1122 Type 2 diabetes mellitus with diabetic chronic kidney disease: Secondary | ICD-10-CM | POA: Diagnosis not present

## 2017-08-02 DIAGNOSIS — E876 Hypokalemia: Secondary | ICD-10-CM | POA: Diagnosis not present

## 2017-08-02 DIAGNOSIS — N186 End stage renal disease: Secondary | ICD-10-CM | POA: Diagnosis not present

## 2017-08-04 DIAGNOSIS — D631 Anemia in chronic kidney disease: Secondary | ICD-10-CM | POA: Diagnosis not present

## 2017-08-04 DIAGNOSIS — E1122 Type 2 diabetes mellitus with diabetic chronic kidney disease: Secondary | ICD-10-CM | POA: Diagnosis not present

## 2017-08-04 DIAGNOSIS — N2581 Secondary hyperparathyroidism of renal origin: Secondary | ICD-10-CM | POA: Diagnosis not present

## 2017-08-04 DIAGNOSIS — E876 Hypokalemia: Secondary | ICD-10-CM | POA: Diagnosis not present

## 2017-08-04 DIAGNOSIS — N186 End stage renal disease: Secondary | ICD-10-CM | POA: Diagnosis not present

## 2017-08-06 DIAGNOSIS — N186 End stage renal disease: Secondary | ICD-10-CM | POA: Diagnosis not present

## 2017-08-06 DIAGNOSIS — N2581 Secondary hyperparathyroidism of renal origin: Secondary | ICD-10-CM | POA: Diagnosis not present

## 2017-08-06 DIAGNOSIS — E876 Hypokalemia: Secondary | ICD-10-CM | POA: Diagnosis not present

## 2017-08-06 DIAGNOSIS — D631 Anemia in chronic kidney disease: Secondary | ICD-10-CM | POA: Diagnosis not present

## 2017-08-06 DIAGNOSIS — E1122 Type 2 diabetes mellitus with diabetic chronic kidney disease: Secondary | ICD-10-CM | POA: Diagnosis not present

## 2017-08-11 DIAGNOSIS — N2581 Secondary hyperparathyroidism of renal origin: Secondary | ICD-10-CM | POA: Diagnosis not present

## 2017-08-11 DIAGNOSIS — E876 Hypokalemia: Secondary | ICD-10-CM | POA: Diagnosis not present

## 2017-08-11 DIAGNOSIS — N186 End stage renal disease: Secondary | ICD-10-CM | POA: Diagnosis not present

## 2017-08-11 DIAGNOSIS — E1122 Type 2 diabetes mellitus with diabetic chronic kidney disease: Secondary | ICD-10-CM | POA: Diagnosis not present

## 2017-08-11 DIAGNOSIS — D631 Anemia in chronic kidney disease: Secondary | ICD-10-CM | POA: Diagnosis not present

## 2017-08-13 DIAGNOSIS — D631 Anemia in chronic kidney disease: Secondary | ICD-10-CM | POA: Diagnosis not present

## 2017-08-13 DIAGNOSIS — E876 Hypokalemia: Secondary | ICD-10-CM | POA: Diagnosis not present

## 2017-08-13 DIAGNOSIS — N2581 Secondary hyperparathyroidism of renal origin: Secondary | ICD-10-CM | POA: Diagnosis not present

## 2017-08-13 DIAGNOSIS — E1122 Type 2 diabetes mellitus with diabetic chronic kidney disease: Secondary | ICD-10-CM | POA: Diagnosis not present

## 2017-08-13 DIAGNOSIS — N186 End stage renal disease: Secondary | ICD-10-CM | POA: Diagnosis not present

## 2017-08-16 DIAGNOSIS — D631 Anemia in chronic kidney disease: Secondary | ICD-10-CM | POA: Diagnosis not present

## 2017-08-16 DIAGNOSIS — E876 Hypokalemia: Secondary | ICD-10-CM | POA: Diagnosis not present

## 2017-08-16 DIAGNOSIS — E1122 Type 2 diabetes mellitus with diabetic chronic kidney disease: Secondary | ICD-10-CM | POA: Diagnosis not present

## 2017-08-16 DIAGNOSIS — N2581 Secondary hyperparathyroidism of renal origin: Secondary | ICD-10-CM | POA: Diagnosis not present

## 2017-08-16 DIAGNOSIS — N186 End stage renal disease: Secondary | ICD-10-CM | POA: Diagnosis not present

## 2017-08-18 DIAGNOSIS — N2581 Secondary hyperparathyroidism of renal origin: Secondary | ICD-10-CM | POA: Diagnosis not present

## 2017-08-18 DIAGNOSIS — E1122 Type 2 diabetes mellitus with diabetic chronic kidney disease: Secondary | ICD-10-CM | POA: Diagnosis not present

## 2017-08-18 DIAGNOSIS — E876 Hypokalemia: Secondary | ICD-10-CM | POA: Diagnosis not present

## 2017-08-18 DIAGNOSIS — N186 End stage renal disease: Secondary | ICD-10-CM | POA: Diagnosis not present

## 2017-08-18 DIAGNOSIS — D631 Anemia in chronic kidney disease: Secondary | ICD-10-CM | POA: Diagnosis not present

## 2017-08-20 DIAGNOSIS — E876 Hypokalemia: Secondary | ICD-10-CM | POA: Diagnosis not present

## 2017-08-20 DIAGNOSIS — E1122 Type 2 diabetes mellitus with diabetic chronic kidney disease: Secondary | ICD-10-CM | POA: Diagnosis not present

## 2017-08-20 DIAGNOSIS — N2581 Secondary hyperparathyroidism of renal origin: Secondary | ICD-10-CM | POA: Diagnosis not present

## 2017-08-20 DIAGNOSIS — N186 End stage renal disease: Secondary | ICD-10-CM | POA: Diagnosis not present

## 2017-08-20 DIAGNOSIS — D631 Anemia in chronic kidney disease: Secondary | ICD-10-CM | POA: Diagnosis not present

## 2017-08-22 DIAGNOSIS — E876 Hypokalemia: Secondary | ICD-10-CM | POA: Diagnosis not present

## 2017-08-22 DIAGNOSIS — N2581 Secondary hyperparathyroidism of renal origin: Secondary | ICD-10-CM | POA: Diagnosis not present

## 2017-08-22 DIAGNOSIS — N186 End stage renal disease: Secondary | ICD-10-CM | POA: Diagnosis not present

## 2017-08-22 DIAGNOSIS — D631 Anemia in chronic kidney disease: Secondary | ICD-10-CM | POA: Diagnosis not present

## 2017-08-22 DIAGNOSIS — E1122 Type 2 diabetes mellitus with diabetic chronic kidney disease: Secondary | ICD-10-CM | POA: Diagnosis not present

## 2017-08-25 DIAGNOSIS — N2581 Secondary hyperparathyroidism of renal origin: Secondary | ICD-10-CM | POA: Diagnosis not present

## 2017-08-25 DIAGNOSIS — D631 Anemia in chronic kidney disease: Secondary | ICD-10-CM | POA: Diagnosis not present

## 2017-08-25 DIAGNOSIS — E876 Hypokalemia: Secondary | ICD-10-CM | POA: Diagnosis not present

## 2017-08-25 DIAGNOSIS — N186 End stage renal disease: Secondary | ICD-10-CM | POA: Diagnosis not present

## 2017-08-25 DIAGNOSIS — E1122 Type 2 diabetes mellitus with diabetic chronic kidney disease: Secondary | ICD-10-CM | POA: Diagnosis not present

## 2017-08-27 DIAGNOSIS — E1122 Type 2 diabetes mellitus with diabetic chronic kidney disease: Secondary | ICD-10-CM | POA: Diagnosis not present

## 2017-08-27 DIAGNOSIS — N186 End stage renal disease: Secondary | ICD-10-CM | POA: Diagnosis not present

## 2017-08-27 DIAGNOSIS — E876 Hypokalemia: Secondary | ICD-10-CM | POA: Diagnosis not present

## 2017-08-27 DIAGNOSIS — N2581 Secondary hyperparathyroidism of renal origin: Secondary | ICD-10-CM | POA: Diagnosis not present

## 2017-08-27 DIAGNOSIS — D631 Anemia in chronic kidney disease: Secondary | ICD-10-CM | POA: Diagnosis not present

## 2017-08-29 DIAGNOSIS — E876 Hypokalemia: Secondary | ICD-10-CM | POA: Diagnosis not present

## 2017-08-29 DIAGNOSIS — E1122 Type 2 diabetes mellitus with diabetic chronic kidney disease: Secondary | ICD-10-CM | POA: Diagnosis not present

## 2017-08-29 DIAGNOSIS — N186 End stage renal disease: Secondary | ICD-10-CM | POA: Diagnosis not present

## 2017-08-29 DIAGNOSIS — N2581 Secondary hyperparathyroidism of renal origin: Secondary | ICD-10-CM | POA: Diagnosis not present

## 2017-08-29 DIAGNOSIS — D631 Anemia in chronic kidney disease: Secondary | ICD-10-CM | POA: Diagnosis not present

## 2017-08-30 DIAGNOSIS — E1129 Type 2 diabetes mellitus with other diabetic kidney complication: Secondary | ICD-10-CM | POA: Diagnosis not present

## 2017-08-30 DIAGNOSIS — N186 End stage renal disease: Secondary | ICD-10-CM | POA: Diagnosis not present

## 2017-08-30 DIAGNOSIS — Z992 Dependence on renal dialysis: Secondary | ICD-10-CM | POA: Diagnosis not present

## 2017-09-01 DIAGNOSIS — E876 Hypokalemia: Secondary | ICD-10-CM | POA: Diagnosis not present

## 2017-09-01 DIAGNOSIS — N2581 Secondary hyperparathyroidism of renal origin: Secondary | ICD-10-CM | POA: Diagnosis not present

## 2017-09-01 DIAGNOSIS — E1122 Type 2 diabetes mellitus with diabetic chronic kidney disease: Secondary | ICD-10-CM | POA: Diagnosis not present

## 2017-09-01 DIAGNOSIS — D631 Anemia in chronic kidney disease: Secondary | ICD-10-CM | POA: Diagnosis not present

## 2017-09-01 DIAGNOSIS — N186 End stage renal disease: Secondary | ICD-10-CM | POA: Diagnosis not present

## 2017-09-01 DIAGNOSIS — I739 Peripheral vascular disease, unspecified: Secondary | ICD-10-CM | POA: Insufficient documentation

## 2017-09-03 ENCOUNTER — Telehealth: Payer: Self-pay | Admitting: Internal Medicine

## 2017-09-03 DIAGNOSIS — E1122 Type 2 diabetes mellitus with diabetic chronic kidney disease: Secondary | ICD-10-CM | POA: Diagnosis not present

## 2017-09-03 DIAGNOSIS — D631 Anemia in chronic kidney disease: Secondary | ICD-10-CM | POA: Diagnosis not present

## 2017-09-03 DIAGNOSIS — N2581 Secondary hyperparathyroidism of renal origin: Secondary | ICD-10-CM | POA: Diagnosis not present

## 2017-09-03 DIAGNOSIS — E876 Hypokalemia: Secondary | ICD-10-CM | POA: Diagnosis not present

## 2017-09-03 DIAGNOSIS — N186 End stage renal disease: Secondary | ICD-10-CM | POA: Diagnosis not present

## 2017-09-03 NOTE — Telephone Encounter (Signed)
Called pt, was answered and dropped. Tried calling again, received busy signal.

## 2017-09-03 NOTE — Telephone Encounter (Signed)
This was a recall of some lots of losartan, but her losartan is ok, no need to change

## 2017-09-03 NOTE — Telephone Encounter (Signed)
Pt has been informed and expressed understanding.  

## 2017-09-03 NOTE — Telephone Encounter (Signed)
Patient returned your call. Can you call her back please?

## 2017-09-03 NOTE — Telephone Encounter (Signed)
Pt stated that she see on the news that it was "something wrong": with BP medicines. Please advise.

## 2017-09-03 NOTE — Telephone Encounter (Signed)
Copied from [place]. Topic: Inquiry >> Sep 03, 2017  1:30 PM Cecelia Byars, NT wrote: Reason for CRM: Patient called and has concerns about blood pressure medication please advise [phone]

## 2017-09-06 DIAGNOSIS — E876 Hypokalemia: Secondary | ICD-10-CM | POA: Diagnosis not present

## 2017-09-06 DIAGNOSIS — E1122 Type 2 diabetes mellitus with diabetic chronic kidney disease: Secondary | ICD-10-CM | POA: Diagnosis not present

## 2017-09-06 DIAGNOSIS — N2581 Secondary hyperparathyroidism of renal origin: Secondary | ICD-10-CM | POA: Diagnosis not present

## 2017-09-06 DIAGNOSIS — N186 End stage renal disease: Secondary | ICD-10-CM | POA: Diagnosis not present

## 2017-09-06 DIAGNOSIS — D631 Anemia in chronic kidney disease: Secondary | ICD-10-CM | POA: Diagnosis not present

## 2017-09-08 DIAGNOSIS — E876 Hypokalemia: Secondary | ICD-10-CM | POA: Diagnosis not present

## 2017-09-08 DIAGNOSIS — N186 End stage renal disease: Secondary | ICD-10-CM | POA: Diagnosis not present

## 2017-09-08 DIAGNOSIS — D631 Anemia in chronic kidney disease: Secondary | ICD-10-CM | POA: Diagnosis not present

## 2017-09-08 DIAGNOSIS — E1122 Type 2 diabetes mellitus with diabetic chronic kidney disease: Secondary | ICD-10-CM | POA: Diagnosis not present

## 2017-09-08 DIAGNOSIS — N2581 Secondary hyperparathyroidism of renal origin: Secondary | ICD-10-CM | POA: Diagnosis not present

## 2017-09-10 DIAGNOSIS — D631 Anemia in chronic kidney disease: Secondary | ICD-10-CM | POA: Diagnosis not present

## 2017-09-10 DIAGNOSIS — N186 End stage renal disease: Secondary | ICD-10-CM | POA: Diagnosis not present

## 2017-09-10 DIAGNOSIS — E1122 Type 2 diabetes mellitus with diabetic chronic kidney disease: Secondary | ICD-10-CM | POA: Diagnosis not present

## 2017-09-10 DIAGNOSIS — N2581 Secondary hyperparathyroidism of renal origin: Secondary | ICD-10-CM | POA: Diagnosis not present

## 2017-09-10 DIAGNOSIS — E876 Hypokalemia: Secondary | ICD-10-CM | POA: Diagnosis not present

## 2017-09-13 DIAGNOSIS — N186 End stage renal disease: Secondary | ICD-10-CM | POA: Diagnosis not present

## 2017-09-13 DIAGNOSIS — N2581 Secondary hyperparathyroidism of renal origin: Secondary | ICD-10-CM | POA: Diagnosis not present

## 2017-09-13 DIAGNOSIS — E876 Hypokalemia: Secondary | ICD-10-CM | POA: Diagnosis not present

## 2017-09-13 DIAGNOSIS — E1122 Type 2 diabetes mellitus with diabetic chronic kidney disease: Secondary | ICD-10-CM | POA: Diagnosis not present

## 2017-09-13 DIAGNOSIS — D631 Anemia in chronic kidney disease: Secondary | ICD-10-CM | POA: Diagnosis not present

## 2017-09-15 DIAGNOSIS — N186 End stage renal disease: Secondary | ICD-10-CM | POA: Diagnosis not present

## 2017-09-15 DIAGNOSIS — N2581 Secondary hyperparathyroidism of renal origin: Secondary | ICD-10-CM | POA: Diagnosis not present

## 2017-09-15 DIAGNOSIS — D631 Anemia in chronic kidney disease: Secondary | ICD-10-CM | POA: Diagnosis not present

## 2017-09-15 DIAGNOSIS — E1122 Type 2 diabetes mellitus with diabetic chronic kidney disease: Secondary | ICD-10-CM | POA: Diagnosis not present

## 2017-09-15 DIAGNOSIS — E876 Hypokalemia: Secondary | ICD-10-CM | POA: Diagnosis not present

## 2017-09-17 DIAGNOSIS — E1122 Type 2 diabetes mellitus with diabetic chronic kidney disease: Secondary | ICD-10-CM | POA: Diagnosis not present

## 2017-09-17 DIAGNOSIS — E876 Hypokalemia: Secondary | ICD-10-CM | POA: Diagnosis not present

## 2017-09-17 DIAGNOSIS — D631 Anemia in chronic kidney disease: Secondary | ICD-10-CM | POA: Diagnosis not present

## 2017-09-17 DIAGNOSIS — N2581 Secondary hyperparathyroidism of renal origin: Secondary | ICD-10-CM | POA: Diagnosis not present

## 2017-09-17 DIAGNOSIS — N186 End stage renal disease: Secondary | ICD-10-CM | POA: Diagnosis not present

## 2017-09-20 DIAGNOSIS — D631 Anemia in chronic kidney disease: Secondary | ICD-10-CM | POA: Diagnosis not present

## 2017-09-20 DIAGNOSIS — E1122 Type 2 diabetes mellitus with diabetic chronic kidney disease: Secondary | ICD-10-CM | POA: Diagnosis not present

## 2017-09-20 DIAGNOSIS — N186 End stage renal disease: Secondary | ICD-10-CM | POA: Diagnosis not present

## 2017-09-20 DIAGNOSIS — N2581 Secondary hyperparathyroidism of renal origin: Secondary | ICD-10-CM | POA: Diagnosis not present

## 2017-09-20 DIAGNOSIS — E876 Hypokalemia: Secondary | ICD-10-CM | POA: Diagnosis not present

## 2017-09-22 DIAGNOSIS — N186 End stage renal disease: Secondary | ICD-10-CM | POA: Diagnosis not present

## 2017-09-22 DIAGNOSIS — D631 Anemia in chronic kidney disease: Secondary | ICD-10-CM | POA: Diagnosis not present

## 2017-09-22 DIAGNOSIS — E876 Hypokalemia: Secondary | ICD-10-CM | POA: Diagnosis not present

## 2017-09-22 DIAGNOSIS — N2581 Secondary hyperparathyroidism of renal origin: Secondary | ICD-10-CM | POA: Diagnosis not present

## 2017-09-22 DIAGNOSIS — E1122 Type 2 diabetes mellitus with diabetic chronic kidney disease: Secondary | ICD-10-CM | POA: Diagnosis not present

## 2017-09-24 DIAGNOSIS — E1122 Type 2 diabetes mellitus with diabetic chronic kidney disease: Secondary | ICD-10-CM | POA: Diagnosis not present

## 2017-09-24 DIAGNOSIS — N2581 Secondary hyperparathyroidism of renal origin: Secondary | ICD-10-CM | POA: Diagnosis not present

## 2017-09-24 DIAGNOSIS — N186 End stage renal disease: Secondary | ICD-10-CM | POA: Diagnosis not present

## 2017-09-24 DIAGNOSIS — E876 Hypokalemia: Secondary | ICD-10-CM | POA: Diagnosis not present

## 2017-09-24 DIAGNOSIS — D631 Anemia in chronic kidney disease: Secondary | ICD-10-CM | POA: Diagnosis not present

## 2017-09-27 DIAGNOSIS — D631 Anemia in chronic kidney disease: Secondary | ICD-10-CM | POA: Diagnosis not present

## 2017-09-27 DIAGNOSIS — N2581 Secondary hyperparathyroidism of renal origin: Secondary | ICD-10-CM | POA: Diagnosis not present

## 2017-09-27 DIAGNOSIS — E876 Hypokalemia: Secondary | ICD-10-CM | POA: Diagnosis not present

## 2017-09-27 DIAGNOSIS — E1122 Type 2 diabetes mellitus with diabetic chronic kidney disease: Secondary | ICD-10-CM | POA: Diagnosis not present

## 2017-09-27 DIAGNOSIS — N186 End stage renal disease: Secondary | ICD-10-CM | POA: Diagnosis not present

## 2017-09-28 ENCOUNTER — Encounter: Payer: Self-pay | Admitting: Cardiovascular Disease

## 2017-09-28 ENCOUNTER — Ambulatory Visit (INDEPENDENT_AMBULATORY_CARE_PROVIDER_SITE_OTHER): Payer: Medicare Other | Admitting: Cardiovascular Disease

## 2017-09-28 VITALS — BP 160/62 | HR 75 | Ht 59.0 in | Wt 135.0 lb

## 2017-09-28 DIAGNOSIS — I998 Other disorder of circulatory system: Secondary | ICD-10-CM

## 2017-09-28 DIAGNOSIS — I340 Nonrheumatic mitral (valve) insufficiency: Secondary | ICD-10-CM

## 2017-09-28 DIAGNOSIS — I70229 Atherosclerosis of native arteries of extremities with rest pain, unspecified extremity: Secondary | ICD-10-CM

## 2017-09-28 DIAGNOSIS — I214 Non-ST elevation (NSTEMI) myocardial infarction: Secondary | ICD-10-CM | POA: Diagnosis not present

## 2017-09-28 DIAGNOSIS — E7800 Pure hypercholesterolemia, unspecified: Secondary | ICD-10-CM

## 2017-09-28 DIAGNOSIS — I10 Essential (primary) hypertension: Secondary | ICD-10-CM | POA: Diagnosis not present

## 2017-09-28 NOTE — Assessment & Plan Note (Signed)
History of CAD status post cardiac catheterization by Dr. Saunders Revel 07/02/16 with stenting of the circumflex with a synergy drug-eluting stent. She denies chest pain.

## 2017-09-28 NOTE — Progress Notes (Signed)
09/28/2017 [name]   1950/11/21  [id number]  Primary Physician Biagio Borg, MD Primary Cardiologist: Lorretta Harp MD FACP, [place], [place], [place]  HPI:  Carolyn Valenzuela is a 67 y.o.  with h/o type 2 diabetes, hyperlipidemia, Hypertension, chronic kidney disease currently on hemodialysis who was referred for evaluation of peripheral vascular disease. I last saw her in the office 09/29/16. Patient reports that she has been having some discomfort in the lateral aspects of her right and left toes. Pain is present both at rest and with activity, has been ongoing for 4-5 months. It is worst with touch. She also has a chronic wound on her right malleolus for which she is seen by her podiatrist, Dr. Elby Showers. She otherwise denies any pain in her calves or legs with ambulation.  She denies any shortness of breath, chest pain, recent lower extremity swelling or palpitations.  She works at [employer] at [employer] and is on her feet all day. She doesn't exercise on a regular basis.  She has never smoked. She denies any history of heart disease and any family history of ischemic heart disease.  I performed lower extremity arterial Doppler studies which did not show significant obstructive disease however she is to have diminished TBIs bilaterally suggesting the possibility of poor healing as a result of a surgical procedure.. She ultimately has undergone bilateral below-the-knee amputations by Dr. Doran Durand and Sharol Given. In addition, she has known CAD documented angiographically in October of last year by Dr. Merlene Pulling. She had surgical anatomy however she was turned down for surgical revascularization because of frailty and comorbidities. She'll underwent circumflex stenting by Dr. Gerald Stabs End 07/02/16 with a synergy drug-eluting stent. A 2-D echocardiogram has shown moderate LV dysfunction with moderate to severe MR and aortic insufficiency as well. Since I saw her a year ago she  remained currently stable denying chest pain or shortness of breath..   Current Meds  Medication Sig  . aspirin 81 MG EC tablet Take 81 mg by mouth daily.    . clopidogrel (PLAVIX) 75 MG tablet Take 1 tablet (75 mg total) by mouth daily.  . CVS ACID REDUCER 10 MG tablet TAKE 1 TABLET (10 MG TOTAL) BY MOUTH DAILY.  [name] gabapentin (NEURONTIN) 100 MG capsule Take 2 capsules (200 mg total) by mouth 3 (three) times daily.  [name] glipiZIDE (GLUCOTROL XL) 10 MG 24 hr tablet TAKE 1 TABLET BY MOUTH DAILY WITH BREAKFAST.  [name] glucose blood (ONE TOUCH ULTRA TEST) test strip Use as instructed, twice daily code E11.52  . lidocaine-prilocaine (EMLA) cream APPLY A SMALL AMOUNT TO SKIN 3 TIMES A WEEK OVER DIALYSIS SHUNT 30 MINUTES PRIOR TO DIALYSIS  . losartan (COZAAR) 100 MG tablet Take 1 tablet (100 mg total) by mouth daily.  . multivitamin (RENA-VIT) TABS tablet TAKE 1 TABLET BY MOUTH EVERY DAY AT BEDTIME  . pantoprazole (PROTONIX) 40 MG tablet TAKE 1 TABLET BY MOUTH TWICE A DAY BEFORE MEALS  . sevelamer carbonate (RENVELA) 800 MG tablet Take 1,600 mg by mouth 3 (three) times daily with meals.     Allergies  Allergen Reactions  . Oxycodone Itching    Social History   Socioeconomic History  . Marital status: Single    Spouse name: Not on file  . Number of children: Not on file  . Years of education: Not on file  . Highest education level: Not on file  Social Needs  . Financial resource strain: Not  on file  . Food insecurity - worry: Not on file  . Food insecurity - inability: Not on file  . Transportation needs - medical: Not on file  . Transportation needs - non-medical: Not on file  Occupational History  . Occupation: Conservation officer, historic buildings: [employer]  Tobacco Use  . Smoking status: Never Smoker  . Smokeless tobacco: Never Used  Substance and Sexual Activity  . Alcohol use: No    Alcohol/week: 0.0 oz  . Drug use: No  . Sexual activity: Not on file  Other Topics Concern  .  Not on file  Social History Narrative  . Not on file     Review of Systems: General: negative for chills, fever, night sweats or weight changes.  Cardiovascular: negative for chest pain, dyspnea on exertion, edema, orthopnea, palpitations, paroxysmal nocturnal dyspnea or shortness of breath Dermatological: negative for rash Respiratory: negative for cough or wheezing Urologic: negative for hematuria Abdominal: negative for nausea, vomiting, diarrhea, bright red blood per rectum, melena, or hematemesis Neurologic: negative for visual changes, syncope, or dizziness All other systems reviewed and are otherwise negative except as noted above.    Blood pressure (!) 160/62, pulse 75, height 4\' 11"  (1.499 m), weight 135 lb (61.2 kg).  General appearance: alert and no distress Neck: no adenopathy, no carotid bruit, no JVD, supple, symmetrical, trachea midline and thyroid not enlarged, symmetric, no tenderness/mass/nodules Lungs: clear to auscultation bilaterally Heart: regular rate and rhythm, S1, S2 normal, no murmur, click, rub or gallop Extremities: extremities normal, atraumatic, no cyanosis or edema Pulses: bilateral BKA's Skin: Skin color, texture, turgor normal. No rashes or lesions Neurologic: Alert and oriented X 3, normal strength and tone. Normal symmetric reflexes. Normal coordination and gait  EKG sinus rhythm at 75 with nonspecific ST and T-wave changes. I personally reviewed this EKG  ASSESSMENT AND PLAN:   Hyperlipidemia History of hyperlipidemia not on statin therapy lipid profile performed 04/06/17 revealing two-vessel 181, triglycerides of 214 and HDL 45.  Essential hypertension History of essential hypertension blood pressure measured 160/62. She is on losartan. Continue current meds at current dosing.  Critical lower limb ischemia History of critical limb ischemia status post bilateral BKA's by Dr. Sharol Given and Dr. Doran Durand in the past. She walks with bilateral prostheses  and a cane  NSTEMI (non-ST elevated myocardial infarction) [facility]) History of CAD status post cardiac catheterization by Dr. Saunders Revel 07/02/16 with stenting of the circumflex with a synergy drug-eluting stent. She denies chest pain.      Lorretta Harp MD FACP,FACC,FAHA, [facility] 09/28/2017 10:25 AM

## 2017-09-28 NOTE — Assessment & Plan Note (Signed)
History of critical limb ischemia status post bilateral BKA's by Dr. Sharol Given and Dr. Doran Durand in the past. She walks with bilateral prostheses and a cane

## 2017-09-28 NOTE — Assessment & Plan Note (Signed)
History of essential hypertension blood pressure measured 160/62. She is on losartan. Continue current meds at current dosing.

## 2017-09-28 NOTE — Patient Instructions (Signed)
Medication Instructions: Your physician recommends that you continue on your current medications as directed. Please refer to the Current Medication list given to you today.  Testing: Your physician has requested that you have an echocardiogram. Echocardiography is a painless test that uses sound waves to create images of your heart. It provides your doctor with information about the size and shape of your heart and how well your heart's chambers and valves are working. This procedure takes approximately one hour. There are no restrictions for this procedure.  Follow-Up: Your physician wants you to follow-up in: 1 year with Dr. Gwenlyn Found. You will receive a reminder letter in the mail two months in advance. If you don't receive a letter, please call our office to schedule the follow-up appointment.  If you need a refill on your cardiac medications before your next appointment, please call your pharmacy.

## 2017-09-28 NOTE — Assessment & Plan Note (Signed)
History of hyperlipidemia not on statin therapy lipid profile performed 04/06/17 revealing two-vessel 181, triglycerides of 214 and HDL 45.

## 2017-09-29 DIAGNOSIS — E876 Hypokalemia: Secondary | ICD-10-CM | POA: Diagnosis not present

## 2017-09-29 DIAGNOSIS — E1122 Type 2 diabetes mellitus with diabetic chronic kidney disease: Secondary | ICD-10-CM | POA: Diagnosis not present

## 2017-09-29 DIAGNOSIS — N2581 Secondary hyperparathyroidism of renal origin: Secondary | ICD-10-CM | POA: Diagnosis not present

## 2017-09-29 DIAGNOSIS — D631 Anemia in chronic kidney disease: Secondary | ICD-10-CM | POA: Diagnosis not present

## 2017-09-29 DIAGNOSIS — N186 End stage renal disease: Secondary | ICD-10-CM | POA: Diagnosis not present

## 2017-09-30 ENCOUNTER — Other Ambulatory Visit: Payer: Self-pay | Admitting: Cardiovascular Disease

## 2017-09-30 DIAGNOSIS — E1129 Type 2 diabetes mellitus with other diabetic kidney complication: Secondary | ICD-10-CM | POA: Diagnosis not present

## 2017-09-30 DIAGNOSIS — Z992 Dependence on renal dialysis: Secondary | ICD-10-CM | POA: Diagnosis not present

## 2017-09-30 DIAGNOSIS — N186 End stage renal disease: Secondary | ICD-10-CM | POA: Diagnosis not present

## 2017-10-01 DIAGNOSIS — D631 Anemia in chronic kidney disease: Secondary | ICD-10-CM | POA: Diagnosis not present

## 2017-10-01 DIAGNOSIS — Z23 Encounter for immunization: Secondary | ICD-10-CM | POA: Diagnosis not present

## 2017-10-01 DIAGNOSIS — N2581 Secondary hyperparathyroidism of renal origin: Secondary | ICD-10-CM | POA: Diagnosis not present

## 2017-10-01 DIAGNOSIS — N186 End stage renal disease: Secondary | ICD-10-CM | POA: Diagnosis not present

## 2017-10-01 DIAGNOSIS — E876 Hypokalemia: Secondary | ICD-10-CM | POA: Diagnosis not present

## 2017-10-01 DIAGNOSIS — E1129 Type 2 diabetes mellitus with other diabetic kidney complication: Secondary | ICD-10-CM | POA: Diagnosis not present

## 2017-10-01 DIAGNOSIS — E1122 Type 2 diabetes mellitus with diabetic chronic kidney disease: Secondary | ICD-10-CM | POA: Diagnosis not present

## 2017-10-01 DIAGNOSIS — Z992 Dependence on renal dialysis: Secondary | ICD-10-CM | POA: Diagnosis not present

## 2017-10-04 DIAGNOSIS — D631 Anemia in chronic kidney disease: Secondary | ICD-10-CM | POA: Diagnosis not present

## 2017-10-04 DIAGNOSIS — N2581 Secondary hyperparathyroidism of renal origin: Secondary | ICD-10-CM | POA: Diagnosis not present

## 2017-10-04 DIAGNOSIS — E1122 Type 2 diabetes mellitus with diabetic chronic kidney disease: Secondary | ICD-10-CM | POA: Diagnosis not present

## 2017-10-04 DIAGNOSIS — Z23 Encounter for immunization: Secondary | ICD-10-CM | POA: Diagnosis not present

## 2017-10-04 DIAGNOSIS — N186 End stage renal disease: Secondary | ICD-10-CM | POA: Diagnosis not present

## 2017-10-04 DIAGNOSIS — E876 Hypokalemia: Secondary | ICD-10-CM | POA: Diagnosis not present

## 2017-10-06 DIAGNOSIS — E876 Hypokalemia: Secondary | ICD-10-CM | POA: Diagnosis not present

## 2017-10-06 DIAGNOSIS — E1122 Type 2 diabetes mellitus with diabetic chronic kidney disease: Secondary | ICD-10-CM | POA: Diagnosis not present

## 2017-10-06 DIAGNOSIS — Z23 Encounter for immunization: Secondary | ICD-10-CM | POA: Diagnosis not present

## 2017-10-06 DIAGNOSIS — N186 End stage renal disease: Secondary | ICD-10-CM | POA: Diagnosis not present

## 2017-10-06 DIAGNOSIS — N2581 Secondary hyperparathyroidism of renal origin: Secondary | ICD-10-CM | POA: Diagnosis not present

## 2017-10-06 DIAGNOSIS — D631 Anemia in chronic kidney disease: Secondary | ICD-10-CM | POA: Diagnosis not present

## 2017-10-07 ENCOUNTER — Ambulatory Visit (INDEPENDENT_AMBULATORY_CARE_PROVIDER_SITE_OTHER): Payer: Medicare Other | Admitting: Internal Medicine

## 2017-10-07 ENCOUNTER — Encounter: Payer: Self-pay | Admitting: Internal Medicine

## 2017-10-07 ENCOUNTER — Telehealth: Payer: Self-pay

## 2017-10-07 VITALS — BP 140/88 | HR 79 | Temp 98.3°F | Ht 59.0 in | Wt 134.0 lb

## 2017-10-07 DIAGNOSIS — E1152 Type 2 diabetes mellitus with diabetic peripheral angiopathy with gangrene: Secondary | ICD-10-CM | POA: Diagnosis not present

## 2017-10-07 DIAGNOSIS — K219 Gastro-esophageal reflux disease without esophagitis: Secondary | ICD-10-CM

## 2017-10-07 DIAGNOSIS — I10 Essential (primary) hypertension: Secondary | ICD-10-CM

## 2017-10-07 DIAGNOSIS — R140 Abdominal distension (gaseous): Secondary | ICD-10-CM

## 2017-10-07 LAB — POCT GLYCOSYLATED HEMOGLOBIN (HGB A1C): Hemoglobin A1C: 9.3

## 2017-10-07 MED ORDER — LINAGLIPTIN 5 MG PO TABS: 5.0000 mg | ORAL_TABLET | Freq: Every day | ORAL | 3 refills | Status: DC

## 2017-10-07 MED ORDER — SACCHAROMYCES BOULARDII 250 MG PO CAPS: 250.0000 mg | ORAL_CAPSULE | Freq: Every day | ORAL | 3 refills | Status: DC

## 2017-10-07 MED ORDER — FAMOTIDINE 20 MG PO TABS: 20.0000 mg | ORAL_TABLET | Freq: Every day | ORAL | 3 refills | Status: DC

## 2017-10-07 MED ORDER — SACCHAROMYCES BOULARDII 250 MG PO CAPS
250.0000 mg | ORAL_CAPSULE | Freq: Every day | ORAL | 3 refills | Status: DC
Start: 2017-10-07 — End: 2017-10-07

## 2017-10-07 NOTE — Patient Instructions (Addendum)
Your A1c as high today at 9.5,  So Please take all new medication as prescribed - the new Tradjenta for sugar  Please also take all new medication as prescribed - the pepcid, and Probiotic  Please continue all other medications as before, and refills have been done if requested.  Please have the pharmacy call with any other refills you may need.  Please continue your efforts at being more active, low cholesterol diet, and weight control.  Please keep your appointments with your specialists as you may have planned  Please return in 6 months, or sooner if needed

## 2017-10-07 NOTE — Progress Notes (Signed)
Subjective:    Patient ID: Carolyn Valenzuela, female    DOB: [birth year], 67 y.o.   MRN: [id number]  HPI   Here to f/u; overall doing ok,  Pt denies chest pain, increasing sob or doe, wheezing, orthopnea, PND, increased LE swelling, palpitations, dizziness or syncope.  Pt denies new neurological symptoms such as new headache, or facial or extremity weakness or numbness.  Pt denies polydipsia, polyuria, or low sugar episode.  Pt states overall good compliance with meds, mostly trying to follow appropriate diet, with wt overall stable Wt Readings from Last 3 Encounters:  10/07/17 134 lb (60.8 kg)  09/28/17 135 lb (61.2 kg)  04/06/17 131 lb (59.4 kg)  Also has mild worsening reflux, with some abd discomfort upper bilat with gas and bloating, dysphagia, n/v, bowel change or blood.  Also due for eye exam sched later this month, and has echo scheduled for next tues.  Past Medical History:  Diagnosis Date  . Allergic rhinitis, cause unspecified 12/08/2010  . ANEMIA-IRON DEFICIENCY 01/25/2008  . Aortic regurgitation   . CAD (coronary artery disease)    a. 07/2016 NSTEMI/PCI: LM nl, LAD 70ost, 54m/d, 80d, D1 40ost, LCX 99ost/p (2.5x16 Synergy DES - 2.75), 40/65m, Om2 100 CTO, RCA  30p/m. Pt eval by CT surgery, not felt to be suitable candidate 2/2 comorbidities.  . CERVICAL RADICULOPATHY, LEFT 01/25/2008  . Chronic systolic CHF (congestive heart failure) ([place])    a. 07/2016 Echo: EF 40-45%, diff HK, sev AI, mild MVP w/ mod to sev MR, PASP 18mmHg.  . Critical lower limb ischemia   . DIABETES MELLITUS, UNCONTROLLED 05/21/2009  . Esophagitis   . ESRD on dialysis ([place])   . Foot ulcer ([place])    right lateral malleolus  . HCAP (healthcare-associated pneumonia) 06/29/2016  . HYPERLIPIDEMIA 03/30/2007  . HYPERTENSION 01/25/2008  . Ischemic cardiomyopathy    a. 07/2016 Echo: EF 40-45%.  . Mitral regurgitation    a. 07/2016 Echo: Mild MVP w/ mod to sev MR.  [name] PVD (peripheral vascular disease) ([place])    a. s/p  bilat BKA  . SECONDARY HYPERPARATHYROIDISM 05/21/2009  . Severe aortic regurgitation    a. 07/2016 Echo: Severe AI.   Past Surgical History:  Procedure Laterality Date  . AMPUTATION Right 03/15/2014   Procedure: RIGHT  LEG  BELOW KNEE AMPUTATION ;  Surgeon: [name], MD;  Location: [facility];  Service: Orthopedics;  Laterality: Right;  . AMPUTATION Left 11/29/2015   Procedure: AMPUTATION BELOW KNEE;  Surgeon: Newt Minion, MD;  Location: [facility];  Service: Orthopedics;  Laterality: Left;  . AV FISTULA PLACEMENT Left   . AV FISTULA PLACEMENT Left 09/06/2014   Procedure: ARTERIOVENOUS (AV) FISTULA CREATION-Left Brachiocephalic;  Surgeon: Conrad Montgomery, MD;  Location: [facility];  Service: Vascular;  Laterality: Left;  . CARDIAC CATHETERIZATION N/A 06/30/2016   Procedure: Left Heart Cath and Coronary Angiography;  Surgeon: Burnell Blanks, MD;  Location: [facility];  Service: Cardiovascular;  Laterality: N/A;  . CARDIAC CATHETERIZATION N/A 07/02/2016   Procedure: Coronary Stent Intervention;  Surgeon: Nelva Bush, MD;  Location: [facility];  Service: Cardiovascular;  Laterality: N/A;  . COLONOSCOPY    . CORONARY ANGIOPLASTY WITH STENT PLACEMENT  07/02/2016  . ESOPHAGOGASTRODUODENOSCOPY N/A 03/20/2016   Procedure: ESOPHAGOGASTRODUODENOSCOPY (EGD);  Surgeon: Milus Banister, MD;  Location: [facility];  Service: Endoscopy;  Laterality: N/A;  . EYE SURGERY Bilateral    cataracts removed, left eye still has some oil in  it.  . INSERTION OF DIALYSIS CATHETER N/A 09/03/2014   Procedure: INSERTION OF DIALYSIS CATHETER RIGHT INTERNAL JUGULAR VEIN;  Surgeon: Conrad Roanoke, MD;  Location: [facility];  Service: Vascular;  Laterality: N/A;  . PERIPHERAL VASCULAR CATHETERIZATION N/A 08/13/2015   Procedure: Abdominal Aortogram;  Surgeon: Elam Dutch, MD;  Location: [facility];  Service: Cardiovascular;  Laterality: N/A;    reports that  has never smoked. she has never used smokeless  tobacco. She reports that she does not drink alcohol or use drugs. family history includes Cancer in her mother. Allergies  Allergen Reactions  . Oxycodone Itching   Current Outpatient Medications on File Prior to Visit  Medication Sig Dispense Refill  . aspirin 81 MG EC tablet Take 81 mg by mouth daily.      . clopidogrel (PLAVIX) 75 MG tablet TAKE 1 TABLET BY MOUTH EVERY DAY 90 tablet 3  . gabapentin (NEURONTIN) 100 MG capsule Take 2 capsules (200 mg total) by mouth 3 (three) times daily. 540 capsule 1  . glipiZIDE (GLUCOTROL XL) 10 MG 24 hr tablet TAKE 1 TABLET BY MOUTH DAILY WITH BREAKFAST. 90 tablet 3  . glucose blood (ONE TOUCH ULTRA TEST) test strip Use as instructed, twice daily code E11.52 200 each 12  . lidocaine-prilocaine (EMLA) cream APPLY A SMALL AMOUNT TO SKIN 3 TIMES A WEEK OVER DIALYSIS SHUNT 30 MINUTES PRIOR TO DIALYSIS 30 g 5  . losartan (COZAAR) 100 MG tablet Take 1 tablet (100 mg total) by mouth daily. 90 tablet 3  . multivitamin (RENA-VIT) TABS tablet TAKE 1 TABLET BY MOUTH EVERY DAY AT BEDTIME 30 tablet 11  . pantoprazole (PROTONIX) 40 MG tablet TAKE 1 TABLET BY MOUTH TWICE A DAY BEFORE MEALS 60 tablet 5  . sevelamer carbonate (RENVELA) 800 MG tablet Take 1,600 mg by mouth 3 (three) times daily with meals.     No current facility-administered medications on file prior to visit.    Review of Systems  Constitutional: Negative for other unusual diaphoresis or sweats HENT: Negative for ear discharge or swelling Eyes: Negative for other worsening visual disturbances Respiratory: Negative for stridor or other swelling  Gastrointestinal: Negative for worsening distension or other blood Genitourinary: Negative for retention or other urinary change Musculoskeletal: Negative for other MSK pain or swelling Skin: Negative for color change or other new lesions Neurological: Negative for worsening tremors and other numbness  Psychiatric/Behavioral: Negative for worsening  agitation or other fatigue All other system neg per pt    Objective:   Physical Exam BP 140/88   Pulse 79   Temp 98.3 F (36.8 C) (Oral)   Ht 4\' 11"  (1.499 m)   Wt 134 lb (60.8 kg)   SpO2 98%   BMI 27.06 kg/m  VS noted,  Constitutional: Pt appears in NAD HENT: Head: NCAT.  Right Ear: External ear normal.  Left Ear: External ear normal.  Eyes: . Pupils are equal, round, and reactive to light. Conjunctivae and EOM are normal Nose: without d/c or deformity Neck: Neck supple. Gross normal ROM Cardiovascular: Normal rate and regular rhythm.   Pulmonary/Chest: Effort normal and breath sounds without rales or wheezing.  Abd:  Soft, NT, ND, + BS, no organomegaly Neurological: Pt is alert. At baseline orientation, motor grossly intact Skin: Skin is warm. No rashes, other new lesions, no LE edema Psychiatric: Pt behavior is normal without agitation  No other exam findings  Contains abnormal data POCT glycosylated hemoglobin (Hb A1C) .  Component 11:53  Hemoglobin A1C 9.3          Assessment & Plan:

## 2017-10-07 NOTE — Telephone Encounter (Signed)
Copied from [place] [id number]. Topic: General - Other >> Oct 07, 2017 12:32 PM Cl[place], Laban Emperor wrote: Reason for CRM: Pt meds for today was called into the wrong pharmacy, please correct and send them to:  [id number] [address], [place] [phone] (Phone) [phone] (Fax)

## 2017-10-07 NOTE — Telephone Encounter (Signed)
Done

## 2017-10-08 ENCOUNTER — Ambulatory Visit: Payer: Self-pay | Admitting: *Deleted

## 2017-10-08 DIAGNOSIS — E876 Hypokalemia: Secondary | ICD-10-CM | POA: Diagnosis not present

## 2017-10-08 DIAGNOSIS — E1122 Type 2 diabetes mellitus with diabetic chronic kidney disease: Secondary | ICD-10-CM | POA: Diagnosis not present

## 2017-10-08 DIAGNOSIS — N2581 Secondary hyperparathyroidism of renal origin: Secondary | ICD-10-CM | POA: Diagnosis not present

## 2017-10-08 DIAGNOSIS — D631 Anemia in chronic kidney disease: Secondary | ICD-10-CM | POA: Diagnosis not present

## 2017-10-08 DIAGNOSIS — Z23 Encounter for immunization: Secondary | ICD-10-CM | POA: Diagnosis not present

## 2017-10-08 DIAGNOSIS — N186 End stage renal disease: Secondary | ICD-10-CM | POA: Diagnosis not present

## 2017-10-08 NOTE — Telephone Encounter (Signed)
Pt  Denies   Any  Symptoms    She  Had questions  About  Pepcid . She  Was   Seen  Yesterday  By Dr  Jenny Reichmann.  She wanted  To  Know  What  pepcid   Was  Used  For .  She  Was  Advised  The  Mechanism  Of  Action   And   The  Indications  For  As  Well  As dosing  Information  From the provider. She  Also  Had questians about Probiotics and they  Were  Explained .   Pt  Sates  Her  questions  Were  Answered to  Her  Satisfaction.

## 2017-10-10 DIAGNOSIS — R140 Abdominal distension (gaseous): Secondary | ICD-10-CM | POA: Insufficient documentation

## 2017-10-10 DIAGNOSIS — K219 Gastro-esophageal reflux disease without esophagitis: Secondary | ICD-10-CM | POA: Insufficient documentation

## 2017-10-10 NOTE — Assessment & Plan Note (Signed)
BP Readings from Last 3 Encounters:  10/07/17 140/88  09/28/17 (!) 160/62  04/06/17 (!) 156/72  stable overall by history and exam, recent data reviewed with pt, and pt to continue medical treatment as before,  to f/u any worsening symptoms or concerns

## 2017-10-10 NOTE — Assessment & Plan Note (Signed)
Mild uncontrolled, to restart pepcid,  to f/u any worsening symptoms or concerns

## 2017-10-10 NOTE — Assessment & Plan Note (Signed)
Mild to mod uncontrolled, to add tradjenta 5 qd, cont all other tx, diet, wt control

## 2017-10-10 NOTE — Assessment & Plan Note (Signed)
Also for probiotic asd,  to f/u any worsening symptoms or concerns

## 2017-10-11 DIAGNOSIS — E876 Hypokalemia: Secondary | ICD-10-CM | POA: Diagnosis not present

## 2017-10-11 DIAGNOSIS — N2581 Secondary hyperparathyroidism of renal origin: Secondary | ICD-10-CM | POA: Diagnosis not present

## 2017-10-11 DIAGNOSIS — Z23 Encounter for immunization: Secondary | ICD-10-CM | POA: Diagnosis not present

## 2017-10-11 DIAGNOSIS — E1122 Type 2 diabetes mellitus with diabetic chronic kidney disease: Secondary | ICD-10-CM | POA: Diagnosis not present

## 2017-10-11 DIAGNOSIS — D631 Anemia in chronic kidney disease: Secondary | ICD-10-CM | POA: Diagnosis not present

## 2017-10-11 DIAGNOSIS — N186 End stage renal disease: Secondary | ICD-10-CM | POA: Diagnosis not present

## 2017-10-12 ENCOUNTER — Other Ambulatory Visit: Payer: Self-pay

## 2017-10-12 ENCOUNTER — Ambulatory Visit (HOSPITAL_COMMUNITY): Payer: Medicare Other | Attending: Cardiology

## 2017-10-12 DIAGNOSIS — I340 Nonrheumatic mitral (valve) insufficiency: Secondary | ICD-10-CM | POA: Insufficient documentation

## 2017-10-12 DIAGNOSIS — I509 Heart failure, unspecified: Secondary | ICD-10-CM | POA: Diagnosis not present

## 2017-10-12 DIAGNOSIS — I132 Hypertensive heart and chronic kidney disease with heart failure and with stage 5 chronic kidney disease, or end stage renal disease: Secondary | ICD-10-CM | POA: Insufficient documentation

## 2017-10-12 DIAGNOSIS — E785 Hyperlipidemia, unspecified: Secondary | ICD-10-CM | POA: Insufficient documentation

## 2017-10-12 DIAGNOSIS — I255 Ischemic cardiomyopathy: Secondary | ICD-10-CM | POA: Diagnosis not present

## 2017-10-12 DIAGNOSIS — N186 End stage renal disease: Secondary | ICD-10-CM | POA: Insufficient documentation

## 2017-10-12 DIAGNOSIS — I2510 Atherosclerotic heart disease of native coronary artery without angina pectoris: Secondary | ICD-10-CM | POA: Diagnosis not present

## 2017-10-12 DIAGNOSIS — I700 Atherosclerosis of aorta: Secondary | ICD-10-CM | POA: Insufficient documentation

## 2017-10-12 DIAGNOSIS — E1122 Type 2 diabetes mellitus with diabetic chronic kidney disease: Secondary | ICD-10-CM | POA: Diagnosis not present

## 2017-10-13 DIAGNOSIS — Z23 Encounter for immunization: Secondary | ICD-10-CM | POA: Diagnosis not present

## 2017-10-13 DIAGNOSIS — E1122 Type 2 diabetes mellitus with diabetic chronic kidney disease: Secondary | ICD-10-CM | POA: Diagnosis not present

## 2017-10-13 DIAGNOSIS — N2581 Secondary hyperparathyroidism of renal origin: Secondary | ICD-10-CM | POA: Diagnosis not present

## 2017-10-13 DIAGNOSIS — E876 Hypokalemia: Secondary | ICD-10-CM | POA: Diagnosis not present

## 2017-10-13 DIAGNOSIS — D631 Anemia in chronic kidney disease: Secondary | ICD-10-CM | POA: Diagnosis not present

## 2017-10-13 DIAGNOSIS — N186 End stage renal disease: Secondary | ICD-10-CM | POA: Diagnosis not present

## 2017-10-15 DIAGNOSIS — N2581 Secondary hyperparathyroidism of renal origin: Secondary | ICD-10-CM | POA: Diagnosis not present

## 2017-10-15 DIAGNOSIS — N186 End stage renal disease: Secondary | ICD-10-CM | POA: Diagnosis not present

## 2017-10-15 DIAGNOSIS — E1122 Type 2 diabetes mellitus with diabetic chronic kidney disease: Secondary | ICD-10-CM | POA: Diagnosis not present

## 2017-10-15 DIAGNOSIS — Z23 Encounter for immunization: Secondary | ICD-10-CM | POA: Diagnosis not present

## 2017-10-15 DIAGNOSIS — E876 Hypokalemia: Secondary | ICD-10-CM | POA: Diagnosis not present

## 2017-10-15 DIAGNOSIS — D631 Anemia in chronic kidney disease: Secondary | ICD-10-CM | POA: Diagnosis not present

## 2017-10-18 DIAGNOSIS — D631 Anemia in chronic kidney disease: Secondary | ICD-10-CM | POA: Diagnosis not present

## 2017-10-18 DIAGNOSIS — E1122 Type 2 diabetes mellitus with diabetic chronic kidney disease: Secondary | ICD-10-CM | POA: Diagnosis not present

## 2017-10-18 DIAGNOSIS — E876 Hypokalemia: Secondary | ICD-10-CM | POA: Diagnosis not present

## 2017-10-18 DIAGNOSIS — Z23 Encounter for immunization: Secondary | ICD-10-CM | POA: Diagnosis not present

## 2017-10-18 DIAGNOSIS — N186 End stage renal disease: Secondary | ICD-10-CM | POA: Diagnosis not present

## 2017-10-18 DIAGNOSIS — N2581 Secondary hyperparathyroidism of renal origin: Secondary | ICD-10-CM | POA: Diagnosis not present

## 2017-10-20 DIAGNOSIS — N2581 Secondary hyperparathyroidism of renal origin: Secondary | ICD-10-CM | POA: Diagnosis not present

## 2017-10-20 DIAGNOSIS — E1122 Type 2 diabetes mellitus with diabetic chronic kidney disease: Secondary | ICD-10-CM | POA: Diagnosis not present

## 2017-10-20 DIAGNOSIS — N186 End stage renal disease: Secondary | ICD-10-CM | POA: Diagnosis not present

## 2017-10-20 DIAGNOSIS — Z23 Encounter for immunization: Secondary | ICD-10-CM | POA: Diagnosis not present

## 2017-10-20 DIAGNOSIS — E876 Hypokalemia: Secondary | ICD-10-CM | POA: Diagnosis not present

## 2017-10-20 DIAGNOSIS — D631 Anemia in chronic kidney disease: Secondary | ICD-10-CM | POA: Diagnosis not present

## 2017-10-21 ENCOUNTER — Encounter: Payer: Self-pay | Admitting: Internal Medicine

## 2017-10-21 DIAGNOSIS — H35033 Hypertensive retinopathy, bilateral: Secondary | ICD-10-CM | POA: Diagnosis not present

## 2017-10-21 DIAGNOSIS — H18413 Arcus senilis, bilateral: Secondary | ICD-10-CM | POA: Diagnosis not present

## 2017-10-21 DIAGNOSIS — H11153 Pinguecula, bilateral: Secondary | ICD-10-CM | POA: Diagnosis not present

## 2017-10-21 DIAGNOSIS — I10 Essential (primary) hypertension: Secondary | ICD-10-CM | POA: Diagnosis not present

## 2017-10-21 DIAGNOSIS — H1045 Other chronic allergic conjunctivitis: Secondary | ICD-10-CM | POA: Diagnosis not present

## 2017-10-21 DIAGNOSIS — H5203 Hypermetropia, bilateral: Secondary | ICD-10-CM | POA: Diagnosis not present

## 2017-10-21 DIAGNOSIS — H04123 Dry eye syndrome of bilateral lacrimal glands: Secondary | ICD-10-CM | POA: Diagnosis not present

## 2017-10-21 DIAGNOSIS — H11823 Conjunctivochalasis, bilateral: Secondary | ICD-10-CM | POA: Diagnosis not present

## 2017-10-21 DIAGNOSIS — E113293 Type 2 diabetes mellitus with mild nonproliferative diabetic retinopathy without macular edema, bilateral: Secondary | ICD-10-CM | POA: Diagnosis not present

## 2017-10-21 DIAGNOSIS — H40013 Open angle with borderline findings, low risk, bilateral: Secondary | ICD-10-CM | POA: Diagnosis not present

## 2017-10-21 DIAGNOSIS — Z7984 Long term (current) use of oral hypoglycemic drugs: Secondary | ICD-10-CM | POA: Diagnosis not present

## 2017-10-21 DIAGNOSIS — E11319 Type 2 diabetes mellitus with unspecified diabetic retinopathy without macular edema: Secondary | ICD-10-CM | POA: Diagnosis not present

## 2017-10-21 LAB — HM DIABETES EYE EXAM

## 2017-10-22 DIAGNOSIS — N2581 Secondary hyperparathyroidism of renal origin: Secondary | ICD-10-CM | POA: Diagnosis not present

## 2017-10-22 DIAGNOSIS — D631 Anemia in chronic kidney disease: Secondary | ICD-10-CM | POA: Diagnosis not present

## 2017-10-22 DIAGNOSIS — E876 Hypokalemia: Secondary | ICD-10-CM | POA: Diagnosis not present

## 2017-10-22 DIAGNOSIS — Z23 Encounter for immunization: Secondary | ICD-10-CM | POA: Diagnosis not present

## 2017-10-22 DIAGNOSIS — N186 End stage renal disease: Secondary | ICD-10-CM | POA: Diagnosis not present

## 2017-10-22 DIAGNOSIS — E1122 Type 2 diabetes mellitus with diabetic chronic kidney disease: Secondary | ICD-10-CM | POA: Diagnosis not present

## 2017-10-25 DIAGNOSIS — D631 Anemia in chronic kidney disease: Secondary | ICD-10-CM | POA: Diagnosis not present

## 2017-10-25 DIAGNOSIS — E1122 Type 2 diabetes mellitus with diabetic chronic kidney disease: Secondary | ICD-10-CM | POA: Diagnosis not present

## 2017-10-25 DIAGNOSIS — N186 End stage renal disease: Secondary | ICD-10-CM | POA: Diagnosis not present

## 2017-10-25 DIAGNOSIS — E876 Hypokalemia: Secondary | ICD-10-CM | POA: Diagnosis not present

## 2017-10-25 DIAGNOSIS — Z23 Encounter for immunization: Secondary | ICD-10-CM | POA: Diagnosis not present

## 2017-10-25 DIAGNOSIS — N2581 Secondary hyperparathyroidism of renal origin: Secondary | ICD-10-CM | POA: Diagnosis not present

## 2017-10-25 NOTE — Progress Notes (Signed)
[facility] Note  10/26/2017     CHIEF COMPLAINT Patient presents for Retina Evaluation and Diabetic Eye Exam   HISTORY OF PRESENT ILLNESS: Carolyn Valenzuela is a 67 y.o. female who presents to the clinic today for:   HPI    Retina Evaluation    In both eyes.  This started 5 months ago.  Duration of 24.  Associated Symptoms Floaters and Pain.  Negative for Flashes, Redness, Distortion, Photophobia, Blind Spot, Glare, Shoulder/Hip pain, Jaw Claudication, Fatigue, Weight Loss, Scalp Tenderness, Trauma and Fever.  Context:  distance vision, mid-range vision and near vision.  Treatments tried include eye drops and surgery.  Response to treatment was moderate improvement.  I, the attending physician,  performed the HPI with the patient and updated documentation appropriately.          Diabetic Eye Exam    Vision fluctuates with blood sugars.  Associated Symptoms Negative for Blind Spot, Glare, Shoulder/Hip pain, Fatigue, Jaw Claudication, Photophobia, Distortion, Floaters, Redness, Scalp Tenderness, Weight Loss, Fever, Trauma, Pain and Flashes.  Diabetes characteristics include Type 2, taking oral medications and controlled with diet.  This started 20 years ago.  Blood sugar level fluctuates.  Last Blood Glucose 223.  Associated Diagnosis Dialysis.  I, the attending physician,  performed the HPI with the patient and updated documentation appropriately.          Comments    Referral of Dr. Michae Kava for retina eval. Patient  is accompanied by Seth Bake with [facility].  Patient states she has had floaters for appx five months, floaters are mostly in her left eye, she occasionally has pain above her left eye.Pt is a dialysis patient,and is a diabetic 2. Patient reports her BS fluctuates, Bs 223 this am. A1C unsure of  results. Pt is on Glipizide QD .(Tradjenta she is not aware of medication  and is not taking it) Pt is using [place] eye gtts Qd, Taking  Florastor, multivitamin Qd       Last edited by Bernarda Caffey, MD on 10/26/2017  2:29 PM. (History)      Referring physician: Calton Dach, MD [place], [place] [id number]  HISTORICAL INFORMATION:   Selected notes from the MEDICAL RECORD NUMBER Referred by Dr. Shirley Muscat for concern of DME OD and hard exudates OS;  LEE- 02.21.19 (S. Bernstorf) [BCVA OD: 20/40 OS: 20/40] Ocular Hx- hx of TRD OD, S/P vitrectomy OD (03/32/10 - [facility]), CR scar OU, DME OU, PDR OU, VH OU, pseudophakia OU (04-12/2006), S/P focal laser OU (06.01.2008), S/P PRP laser (12.08.2009 - [facility], undoctumented with eye), S/p PPV OS (2014 - [facility]);  PMH- DM, HTN, elevated cholesterol, kidney disorder    CURRENT MEDICATIONS: No current outpatient medications on file. (Ophthalmic Drugs)   No current facility-administered medications for this visit.  (Ophthalmic Drugs)   Current Outpatient Medications (Other)  Medication Sig  . aspirin 81 MG EC tablet Take 81 mg by mouth daily.    . clopidogrel (PLAVIX) 75 MG tablet TAKE 1 TABLET BY MOUTH EVERY DAY  . famotidine (PEPCID) 20 MG tablet Take 1 tablet (20 mg total) by mouth daily.  [name] gabapentin (NEURONTIN) 100 MG capsule Take 2 capsules (200 mg total) by mouth 3 (three) times daily.  [name] glipiZIDE (GLUCOTROL XL) 10 MG 24 hr tablet TAKE 1 TABLET BY MOUTH DAILY WITH BREAKFAST.  [name] glucose blood (ONE TOUCH ULTRA TEST) test strip Use as instructed, twice daily code E11.52  .  lidocaine-prilocaine (EMLA) cream APPLY A SMALL AMOUNT TO SKIN 3 TIMES A WEEK OVER DIALYSIS SHUNT 30 MINUTES PRIOR TO DIALYSIS  . linagliptin (TRADJENTA) 5 MG TABS tablet Take 1 tablet (5 mg total) by mouth daily.  [name] losartan (COZAAR) 100 MG tablet Take 1 tablet (100 mg total) by mouth daily.  . multivitamin (RENA-VIT) TABS tablet TAKE 1 TABLET BY MOUTH EVERY DAY AT BEDTIME  . pantoprazole (PROTONIX) 40 MG tablet TAKE 1 TABLET BY MOUTH TWICE A DAY BEFORE MEALS  . saccharomyces boulardii  (FLORASTOR) 250 MG capsule Take 1 capsule (250 mg total) by mouth daily.  . sevelamer carbonate (RENVELA) 800 MG tablet Take 1,600 mg by mouth 3 (three) times daily with meals.   Current Facility-Administered Medications (Other)  Medication Route  . Bevacizumab (AVASTIN) SOLN 1.25 mg Intravitreal      REVIEW OF SYSTEMS: ROS    Positive for: Musculoskeletal, Endocrine, Cardiovascular, Eyes   Negative for: Constitutional, Gastrointestinal, Neurological, Skin, Genitourinary, HENT, Respiratory, Psychiatric, Allergic/Imm, Heme/Lymph   Last edited by Zenovia Jordan, LPN on 3/90/3009 23:30 PM. (History)       ALLERGIES Allergies  Allergen Reactions  . Oxycodone Itching    PAST MEDICAL HISTORY Past Medical History:  Diagnosis Date  . Allergic rhinitis, cause unspecified 12/08/2010  . ANEMIA-IRON DEFICIENCY 01/25/2008  . Aortic regurgitation   . CAD (coronary artery disease)    a. 07/2016 NSTEMI/PCI: LM nl, LAD 70ost, 74m/d, 80d, D1 40ost, LCX 99ost/p (2.5x16 Synergy DES - 2.75), 40/73m, Om2 100 CTO, RCA  30p/m. Pt eval by CT surgery, not felt to be suitable candidate 2/2 comorbidities.  . CERVICAL RADICULOPATHY, LEFT 01/25/2008  . Chronic systolic CHF (congestive heart failure) ([place])    a. 07/2016 Echo: EF 40-45%, diff HK, sev AI, mild MVP w/ mod to sev MR, PASP 55mmHg.  . Critical lower limb ischemia   . DIABETES MELLITUS, UNCONTROLLED 05/21/2009  . Esophagitis   . ESRD on dialysis ([place])   . Foot ulcer ([place])    right lateral malleolus  . HCAP (healthcare-associated pneumonia) 06/29/2016  . HYPERLIPIDEMIA 03/30/2007  . HYPERTENSION 01/25/2008  . Ischemic cardiomyopathy    a. 07/2016 Echo: EF 40-45%.  . Mitral regurgitation    a. 07/2016 Echo: Mild MVP w/ mod to sev MR.  [name] PVD (peripheral vascular disease) ([place])    a. s/p bilat BKA  . SECONDARY HYPERPARATHYROIDISM 05/21/2009  . Severe aortic regurgitation    a. 07/2016 Echo: Severe AI.   Past Surgical History:  Procedure  Laterality Date  . AMPUTATION Right 03/15/2014   Procedure: RIGHT  LEG  BELOW KNEE AMPUTATION ;  Surgeon: [name], MD;  Location: [facility];  Service: Orthopedics;  Laterality: Right;  . AMPUTATION Left 11/29/2015   Procedure: AMPUTATION BELOW KNEE;  Surgeon: Newt Minion, MD;  Location: [facility];  Service: Orthopedics;  Laterality: Left;  . AV FISTULA PLACEMENT Left   . AV FISTULA PLACEMENT Left 09/06/2014   Procedure: ARTERIOVENOUS (AV) FISTULA CREATION-Left Brachiocephalic;  Surgeon: Conrad Fairfield, MD;  Location: [facility];  Service: Vascular;  Laterality: Left;  . CARDIAC CATHETERIZATION N/A 06/30/2016   Procedure: Left Heart Cath and Coronary Angiography;  Surgeon: Burnell Blanks, MD;  Location: [facility];  Service: Cardiovascular;  Laterality: N/A;  . CARDIAC CATHETERIZATION N/A 07/02/2016   Procedure: Coronary Stent Intervention;  Surgeon: Nelva Bush, MD;  Location: [facility];  Service: Cardiovascular;  Laterality: N/A;  . CATARACT EXTRACTION    . COLONOSCOPY    .  CORONARY ANGIOPLASTY WITH STENT PLACEMENT  07/02/2016  . ESOPHAGOGASTRODUODENOSCOPY N/A 03/20/2016   Procedure: ESOPHAGOGASTRODUODENOSCOPY (EGD);  Surgeon: Milus Banister, MD;  Location: [facility];  Service: Endoscopy;  Laterality: N/A;  . EYE SURGERY Bilateral    cataracts removed, left eye still has some oil in it.  . INSERTION OF DIALYSIS CATHETER N/A 09/03/2014   Procedure: INSERTION OF DIALYSIS CATHETER RIGHT INTERNAL JUGULAR VEIN;  Surgeon: Conrad , MD;  Location: [facility];  Service: Vascular;  Laterality: N/A;  . PERIPHERAL VASCULAR CATHETERIZATION N/A 08/13/2015   Procedure: Abdominal Aortogram;  Surgeon: Elam Dutch, MD;  Location: [facility];  Service: Cardiovascular;  Laterality: N/A;    FAMILY HISTORY Family History  Problem Relation Age of Onset  . Cancer Mother        Pancreatic    SOCIAL HISTORY Social History   Tobacco Use  . Smoking status: Never Smoker  .  Smokeless tobacco: Never Used  Substance Use Topics  . Alcohol use: No    Alcohol/week: 0.0 oz  . Drug use: No         OPHTHALMIC EXAM:  Base Eye Exam    Visual Acuity (Snellen - Linear)      Right Left   Dist cc 20/50 20/40   Dist ph cc 20/40 NI   Correction:  Glasses       Tonometry (Tonopen, 1:56 PM)      Right Left   Pressure 14 16       Pupils      Dark Light Shape React APD   Right 3 3 Round Minimal None   Left 3 3 Round Minimal None       Visual Fields (Counting fingers)      Left Right    Full Full       Extraocular Movement      Right Left    Full, Ortho Full, Ortho       Neuro/Psych    Oriented x3:  Yes   Mood/Affect:  Normal       Dilation    Both eyes:  1.0% Mydriacyl, 2.5% Phenylephrine @ 1:56 PM        Slit Lamp and Fundus Exam    Slit Lamp Exam      Right Left   Lids/Lashes dermatochalasis dermatochalasis   Conjunctiva/Sclera White and quiet White and quiet   Cornea arcus; 1+ PEE; 2+ guttae arcus; 1-2+ guttae   Anterior Chamber Deep and quiet Deep and quiet   Iris Round, dilated; no NVI Round, dilated; large PI at 0600 no NVI   Lens PCIOL in good position PCIOL in good position   Vitreous clear clear       Fundus Exam      Right Left   Disc Mild Pallor Mild Pallor   C/D Ratio 0.3 0.35   Macula Scattered MAs, dot hemorrhages, exudates, blunted foveal reflex with positive edema Blunted foveal reflex, scattered Exudates, Retinal pigment epithelial mottling, focal laser scars   Vessels Vascular attenuation, scerolitc arterioles sclerolic arteriols, generalized attenuation   Periphery 360 PRP, scattered areas of fibrosis 360 PRP in place, areas of fibrosis        Refraction    Wearing Rx      Sphere Cylinder Axis Add   Right +0.25 +0.25 037 +2.50   Left [place] +0.75 175 +2.50   Age:  6 yrs       Manifest Refraction      Sphere Cylinder Axis  Dist VA   Right +0.25 +0.25 037 20/40   Left [place] +0.75 175 20/30-2           IMAGING AND PROCEDURES  Imaging and Procedures for 10/26/17  OCT, Retina - OU - Both Eyes     Right Eye Quality was good. Central Foveal Thickness: 294. Progression has no prior data. Findings include abnormal foveal contour, no SRF, intraretinal fluid, epiretinal membrane, intraretinal hyper-reflective material, inner retinal atrophy.   Left Eye Quality was good. Central Foveal Thickness: 279. Progression has no prior data. Findings include abnormal foveal contour, no IRF, no SRF, macular pucker, epiretinal membrane, outer retinal atrophy.   Notes *Images captured and stored on drive  Diagnosis / Impression:  OD: DME; ERM OS: no DME; +ERM w/ some pucker and preretinal fibrosis   Clinical management:  See below  Abbreviations: NFP - Normal foveal profile. CME - cystoid macular edema. PED - pigment epithelial detachment. IRF - intraretinal fluid. SRF - subretinal fluid. EZ - ellipsoid zone. ERM - epiretinal membrane. ORA - outer retinal atrophy. ORT - outer retinal tubulation. SRHM - subretinal hyper-reflective material         Intravitreal Injection, Pharmacologic Agent - OD - Right Eye     Time Out 10/26/2017. 3:15 PM. Confirmed correct patient, procedure, site, and patient consented.   Anesthesia Topical anesthesia was used. Anesthetic medications included Lidocaine 2%, Tetracaine 0.5%.   Procedure Preparation included 5% betadine to ocular surface, eyelid speculum. A supplied needle was used.   Injection: 1.25 mg Bevacizumab 1.25mg /0.66ml   NDC: 12751-700-17    Lot: [id number]     Expiration Date: 12/15/2017   Route: Intravitreal   Site: Right Eye   Waste: 0 mg  Post-op Post injection exam found visual acuity of at least counting fingers. The patient tolerated the procedure well. There were no complications. The patient received written and verbal post procedure care education.                 ASSESSMENT/PLAN:    ICD-10-CM   1. Proliferative  diabetic retinopathy of right eye with macular edema associated with type 2 diabetes mellitus (HCC) 12/28/2017 Intravitreal Injection, Pharmacologic Agent - OD - Right Eye    Bevacizumab (AVASTIN) SOLN 1.25 mg  2. Stable proliferative diabetic retinopathy of left eye associated with type 2 diabetes mellitus ([place]) [address]   3. Retinal edema H35.81 OCT, Retina - OU - Both Eyes  4. Pseudophakia of both eyes Z96.1   5. Corneal guttata H18.51     1,2,3. Proliferative diabetic retinopathy OU- - The incidence, risk factors for progression, natural history and treatment options for diabetic retinopathy  were discussed with patient.   - The need for close monitoring of blood glucose, blood pressure, and serum lipids, avoiding cigarette or any type of tobacco, and the need for long term follow up was also discussed with patient. - s/p PPV OU with [facility] in 2014 - s/p PRP OU with [facility] - exam shows good PRP 360 OU; sclerotic arterioles with sheathing OU; scattered DBH OU - OCT shows diabetic macular edema, OD The natural history, pathology, and characteristics of diabetic macular edema discussed with patient.  A generalized discussion of the major clinical trials concerning treatment of diabetic macular edema (ETDRS, DCT, SCORE, RISE / RIDE, and ongoing DRCR net studies) was completed.  This discussion included mention of the various approaches to treating diabetic macular edema (observation, laser photocoagulation, anti-VEGF injections with lucentis / Avastin / Eylea, steroid injections with Kenalog /  Ozurdex, and intraocular surgery with vitrectomy).  The goal hemoglobin A1C of 6-7 was discussed, as well as importance of smoking cessation and hypertension control.  Need for ongoing treatment and monitoring were specifically discussed with reference to chronic nature of diabetic macular edema. - recommend IVA #1 OD today, 02.26.19 - pt wishes to proceed - RBA of procedure discussed, questions answered -  informed consent obtained and signed - see procedure note - f/u in 4 wks  4. Pseudophakia OU  - s/p CE/IOL OU by Dr. Elliot Dally  - beautiful surgery, doing well  - monitor  5. Corneal guttata OU - no edema - monitor   Ophthalmic Meds Ordered this visit:  Meds ordered this encounter  Medications  . Bevacizumab (AVASTIN) SOLN 1.25 mg       Return in about 4 weeks (around 11/23/2017) for F/U PDR w/ ME OD.  There are no Patient Instructions on file for this visit.   Explained the diagnoses, plan, and follow up with the patient and they expressed understanding.  Patient expressed understanding of the importance of proper follow up care.   This document serves as a record of services personally performed by Gardiner Sleeper, MD, PhD. It was created on their behalf by Catha Brow, [place], a certified ophthalmic assistant. The creation of this record is the provider's dictation and/or activities during the visit.  Electronically signed by: Catha Brow, COA  10/26/17 3:47 PM   Gardiner Sleeper, M.D., Ph.D. Diseases & Surgery of the Retina and Vitreous [facility] 10/26/17   I have reviewed the above documentation for accuracy and completeness, and I agree with the above. Gardiner Sleeper, M.D., Ph.D. 10/26/17 3:47 PM     Abbreviations: M myopia (nearsighted); A astigmatism; H hyperopia (farsighted); P presbyopia; Mrx spectacle prescription;  CTL contact lenses; OD right eye; OS left eye; OU both eyes  XT exotropia; ET esotropia; PEK punctate epithelial keratitis; PEE punctate epithelial erosions; DES dry eye syndrome; MGD meibomian gland dysfunction; ATs artificial tears; PFAT's preservative free artificial tears; [name] nuclear sclerotic cataract; PSC posterior subcapsular cataract; ERM epi-retinal membrane; PVD posterior vitreous detachment; RD retinal detachment; DM diabetes mellitus; DR diabetic retinopathy; NPDR non-proliferative diabetic retinopathy; PDR  proliferative diabetic retinopathy; CSME clinically significant macular edema; DME diabetic macular edema; dbh dot blot hemorrhages; CWS cotton wool spot; POAG primary open angle glaucoma; C/D cup-to-disc ratio; HVF humphrey visual field; GVF goldmann visual field; OCT optical coherence tomography; IOP intraocular pressure; BRVO Branch retinal vein occlusion; CRVO central retinal vein occlusion; CRAO central retinal artery occlusion; BRAO branch retinal artery occlusion; RT retinal tear; SB scleral buckle; PPV pars plana vitrectomy; VH Vitreous hemorrhage; PRP panretinal laser photocoagulation; IVK intravitreal kenalog; VMT vitreomacular traction; MH Macular hole;  NVD neovascularization of the disc; NVE neovascularization elsewhere; AREDS age related eye disease study; ARMD age related macular degeneration; POAG primary open angle glaucoma; EBMD epithelial/anterior basement membrane dystrophy; ACIOL anterior chamber intraocular lens; IOL intraocular lens; PCIOL posterior chamber intraocular lens; Phaco/IOL phacoemulsification with intraocular lens placement; [place] photorefractive keratectomy; LASIK laser assisted in situ keratomileusis; HTN hypertension; DM diabetes mellitus; COPD chronic obstructive pulmonary disease

## 2017-10-26 ENCOUNTER — Ambulatory Visit (INDEPENDENT_AMBULATORY_CARE_PROVIDER_SITE_OTHER): Payer: Medicare Other | Admitting: Ophthalmology

## 2017-10-26 ENCOUNTER — Encounter (INDEPENDENT_AMBULATORY_CARE_PROVIDER_SITE_OTHER): Payer: Self-pay | Admitting: Ophthalmology

## 2017-10-26 ENCOUNTER — Telehealth: Payer: Self-pay | Admitting: Internal Medicine

## 2017-10-26 DIAGNOSIS — H1851 Endothelial corneal dystrophy: Secondary | ICD-10-CM | POA: Diagnosis not present

## 2017-10-26 DIAGNOSIS — E113552 Type 2 diabetes mellitus with stable proliferative diabetic retinopathy, left eye: Secondary | ICD-10-CM

## 2017-10-26 DIAGNOSIS — Z961 Presence of intraocular lens: Secondary | ICD-10-CM

## 2017-10-26 DIAGNOSIS — E113511 Type 2 diabetes mellitus with proliferative diabetic retinopathy with macular edema, right eye: Secondary | ICD-10-CM | POA: Diagnosis not present

## 2017-10-26 DIAGNOSIS — H3581 Retinal edema: Secondary | ICD-10-CM | POA: Diagnosis not present

## 2017-10-26 DIAGNOSIS — H18519 Endothelial corneal dystrophy, unspecified eye: Secondary | ICD-10-CM

## 2017-10-26 MED ORDER — BEVACIZUMAB CHEMO INJECTION 1.25MG/0.05ML SYRINGE FOR KALEIDOSCOPE
1.2500 mg | INTRAVITREAL | Status: DC
  Administered 2017-10-26: 1.25 mg via INTRAVITREAL

## 2017-10-26 NOTE — Telephone Encounter (Signed)
Copied from [place] [id number]. Topic: Quick Communication - See Telephone Encounter >> Oct 26, 2017  4:12 PM Vernona Rieger wrote: CRM for notification. See Telephone encounter for:   10/26/17.  Pt said she has not been taking her linagliptin (TRADJENTA) 5 MG TABS tablet bc when she went to the pharmacy they did not have it. Please advise.   [id number] [place], [place] [address]  Call back is [phone]

## 2017-10-27 ENCOUNTER — Telehealth: Payer: Self-pay | Admitting: Internal Medicine

## 2017-10-27 DIAGNOSIS — N186 End stage renal disease: Secondary | ICD-10-CM | POA: Diagnosis not present

## 2017-10-27 DIAGNOSIS — N2581 Secondary hyperparathyroidism of renal origin: Secondary | ICD-10-CM | POA: Diagnosis not present

## 2017-10-27 DIAGNOSIS — E876 Hypokalemia: Secondary | ICD-10-CM | POA: Diagnosis not present

## 2017-10-27 DIAGNOSIS — E1122 Type 2 diabetes mellitus with diabetic chronic kidney disease: Secondary | ICD-10-CM | POA: Diagnosis not present

## 2017-10-27 DIAGNOSIS — Z23 Encounter for immunization: Secondary | ICD-10-CM | POA: Diagnosis not present

## 2017-10-27 DIAGNOSIS — D631 Anemia in chronic kidney disease: Secondary | ICD-10-CM | POA: Diagnosis not present

## 2017-10-27 MED ORDER — SITAGLIPTIN PHOSPHATE 50 MG PO TABS: 50.0000 mg | ORAL_TABLET | Freq: Every day | ORAL | 3 refills | Status: DC

## 2017-10-27 NOTE — Telephone Encounter (Signed)
Pt has been informed to check with her insurance company to see what alternatives are covered. She expressed understanding and will call back with her findings.

## 2017-10-27 NOTE — Telephone Encounter (Signed)
This should be ok to try though - I will do erx

## 2017-10-27 NOTE — Telephone Encounter (Signed)
See below

## 2017-10-27 NOTE — Telephone Encounter (Signed)
Copied from [place]. Topic: Quick Communication - See Telephone Encounter >> Oct 27, 2017  2:07 PM Lolita Rieger, RMA wrote: CRM for notification. See Telephone encounter for:   10/27/17.pt called and stated that [place] is covered in place of the trajenta but pt has been on it in the past and is not sure why she was taken off(thinks it was causing her sugar to drop) [phone]

## 2017-10-27 NOTE — Telephone Encounter (Signed)
Pt has been informed and expressed understanding.  

## 2017-10-27 NOTE — Telephone Encounter (Signed)
Call to the pharmacy- they do have the prescription on file for the patient- but her insurance does not cover it. Is there an alternative Rx that the patient can try?

## 2017-10-29 DIAGNOSIS — D631 Anemia in chronic kidney disease: Secondary | ICD-10-CM | POA: Diagnosis not present

## 2017-10-29 DIAGNOSIS — N2581 Secondary hyperparathyroidism of renal origin: Secondary | ICD-10-CM | POA: Diagnosis not present

## 2017-10-29 DIAGNOSIS — N186 End stage renal disease: Secondary | ICD-10-CM | POA: Diagnosis not present

## 2017-10-29 DIAGNOSIS — E876 Hypokalemia: Secondary | ICD-10-CM | POA: Diagnosis not present

## 2017-10-29 DIAGNOSIS — Z992 Dependence on renal dialysis: Secondary | ICD-10-CM | POA: Diagnosis not present

## 2017-10-29 DIAGNOSIS — E1122 Type 2 diabetes mellitus with diabetic chronic kidney disease: Secondary | ICD-10-CM | POA: Diagnosis not present

## 2017-10-29 DIAGNOSIS — E1129 Type 2 diabetes mellitus with other diabetic kidney complication: Secondary | ICD-10-CM | POA: Diagnosis not present

## 2017-10-30 IMAGING — CR DG CHEST 1V PORT SAME DAY
1 series · 1 of 1 positions shown · non-contrast
Comparison: 06/29/2016

CLINICAL DATA: SOB,ESRD Hx of HTN, DM,

EXAM:
PORTABLE CHEST 1 VIEW

[ap portable]
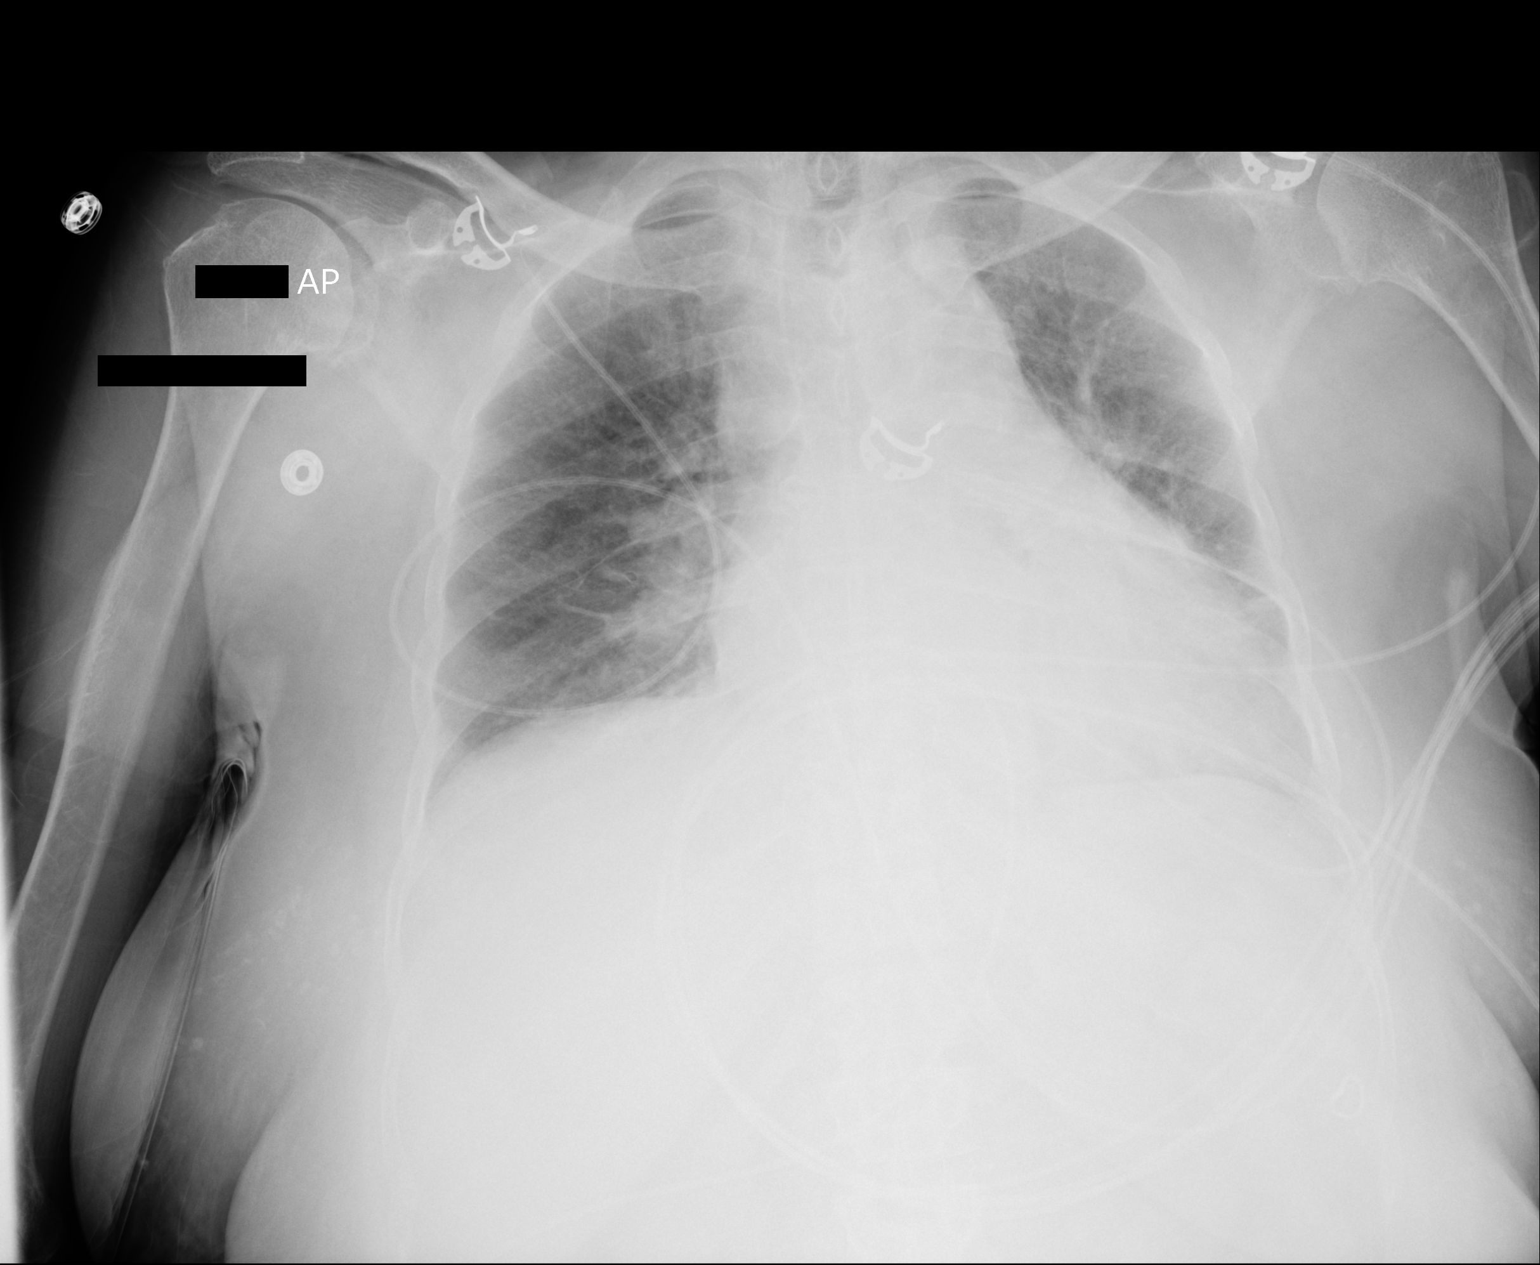

[1 of 1 positions shown; findings below may reference images not displayed]

FINDINGS: Heart is enlarged. There are no focal consolidations or pleural
effusions. No pulmonary edema. Mild perihilar atelectasis.
IMPRESSION: Cardiomegaly.

## 2017-10-31 IMAGING — DX DG CHEST 1V PORT
1 series · 1 of 1 positions shown · non-contrast
Comparison: 07/02/2016

CLINICAL DATA: Shortness of Breath

EXAM:
PORTABLE CHEST 1 VIEW

[chest ap]
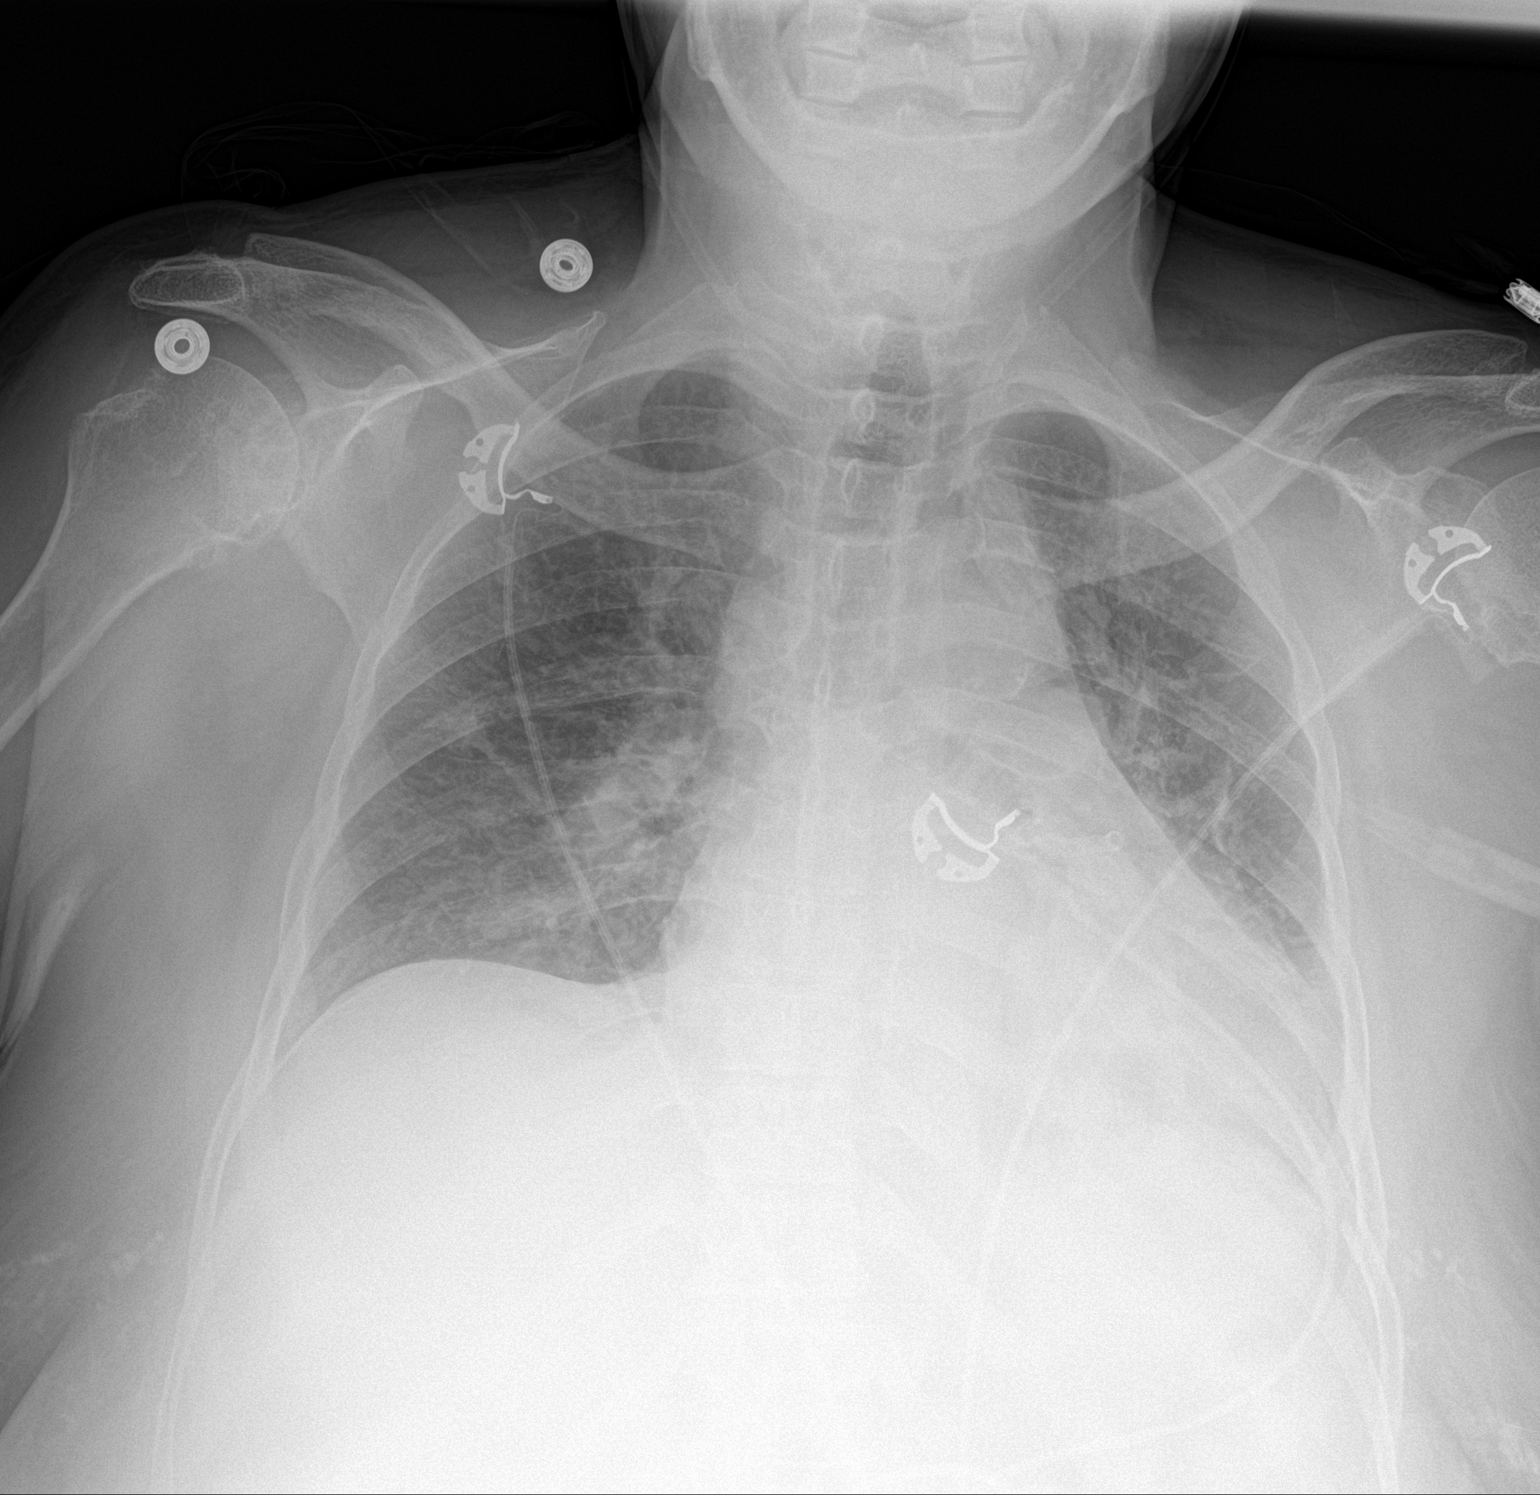

[1 of 1 positions shown; findings below may reference images not displayed]

FINDINGS: Cardiac shadow remains enlarged. Mild vascular congestion is seen
without interstitial edema or sizable effusion. No infiltrates are
noted. No bony abnormality is seen.
IMPRESSION: Mild CHF.

## 2017-11-01 ENCOUNTER — Ambulatory Visit: Payer: Self-pay | Admitting: *Deleted

## 2017-11-01 DIAGNOSIS — N2581 Secondary hyperparathyroidism of renal origin: Secondary | ICD-10-CM | POA: Diagnosis not present

## 2017-11-01 DIAGNOSIS — N186 End stage renal disease: Secondary | ICD-10-CM | POA: Diagnosis not present

## 2017-11-01 DIAGNOSIS — E876 Hypokalemia: Secondary | ICD-10-CM | POA: Diagnosis not present

## 2017-11-01 DIAGNOSIS — E1122 Type 2 diabetes mellitus with diabetic chronic kidney disease: Secondary | ICD-10-CM | POA: Diagnosis not present

## 2017-11-01 DIAGNOSIS — D631 Anemia in chronic kidney disease: Secondary | ICD-10-CM | POA: Diagnosis not present

## 2017-11-01 NOTE — Telephone Encounter (Signed)
Yes, this should be ok, as the [place] is being dosed at lower dose due to the renal failure

## 2017-11-01 NOTE — Telephone Encounter (Signed)
Patient is calling and states she already takes Glipizide 10mg  and has now been prescribed Januvia 50mg . Patient would like a call back from the nurse and get the reassurance that these are both ok to take. Please call patient.  Call to the office to get clarification for patient about what time of day to take her Januvia. Per Sam- patient can take it at any time- patient is a dialysis patient and goes in mornings so she is going to take it at lunch time. Patient will also contact pharmacy for refill of her probiotics when she needs a refill.

## 2017-11-02 ENCOUNTER — Other Ambulatory Visit: Payer: Self-pay | Admitting: Internal Medicine

## 2017-11-02 NOTE — Telephone Encounter (Signed)
Called pt, no VM set up to leave a msg, phone continuously rings.

## 2017-11-03 DIAGNOSIS — E1122 Type 2 diabetes mellitus with diabetic chronic kidney disease: Secondary | ICD-10-CM | POA: Diagnosis not present

## 2017-11-03 DIAGNOSIS — N2581 Secondary hyperparathyroidism of renal origin: Secondary | ICD-10-CM | POA: Diagnosis not present

## 2017-11-03 DIAGNOSIS — D631 Anemia in chronic kidney disease: Secondary | ICD-10-CM | POA: Diagnosis not present

## 2017-11-03 DIAGNOSIS — N186 End stage renal disease: Secondary | ICD-10-CM | POA: Diagnosis not present

## 2017-11-03 DIAGNOSIS — E876 Hypokalemia: Secondary | ICD-10-CM | POA: Diagnosis not present

## 2017-11-04 IMAGING — DX DG CHEST 2V
2 series · 2 of 2 positions shown · non-contrast
Comparison: 07/03/2016 chest radiating

CLINICAL DATA: 65 y/o F; sharp chest pain. Stent placed last week.

EXAM:
CHEST  2 VIEW

[chest pa]
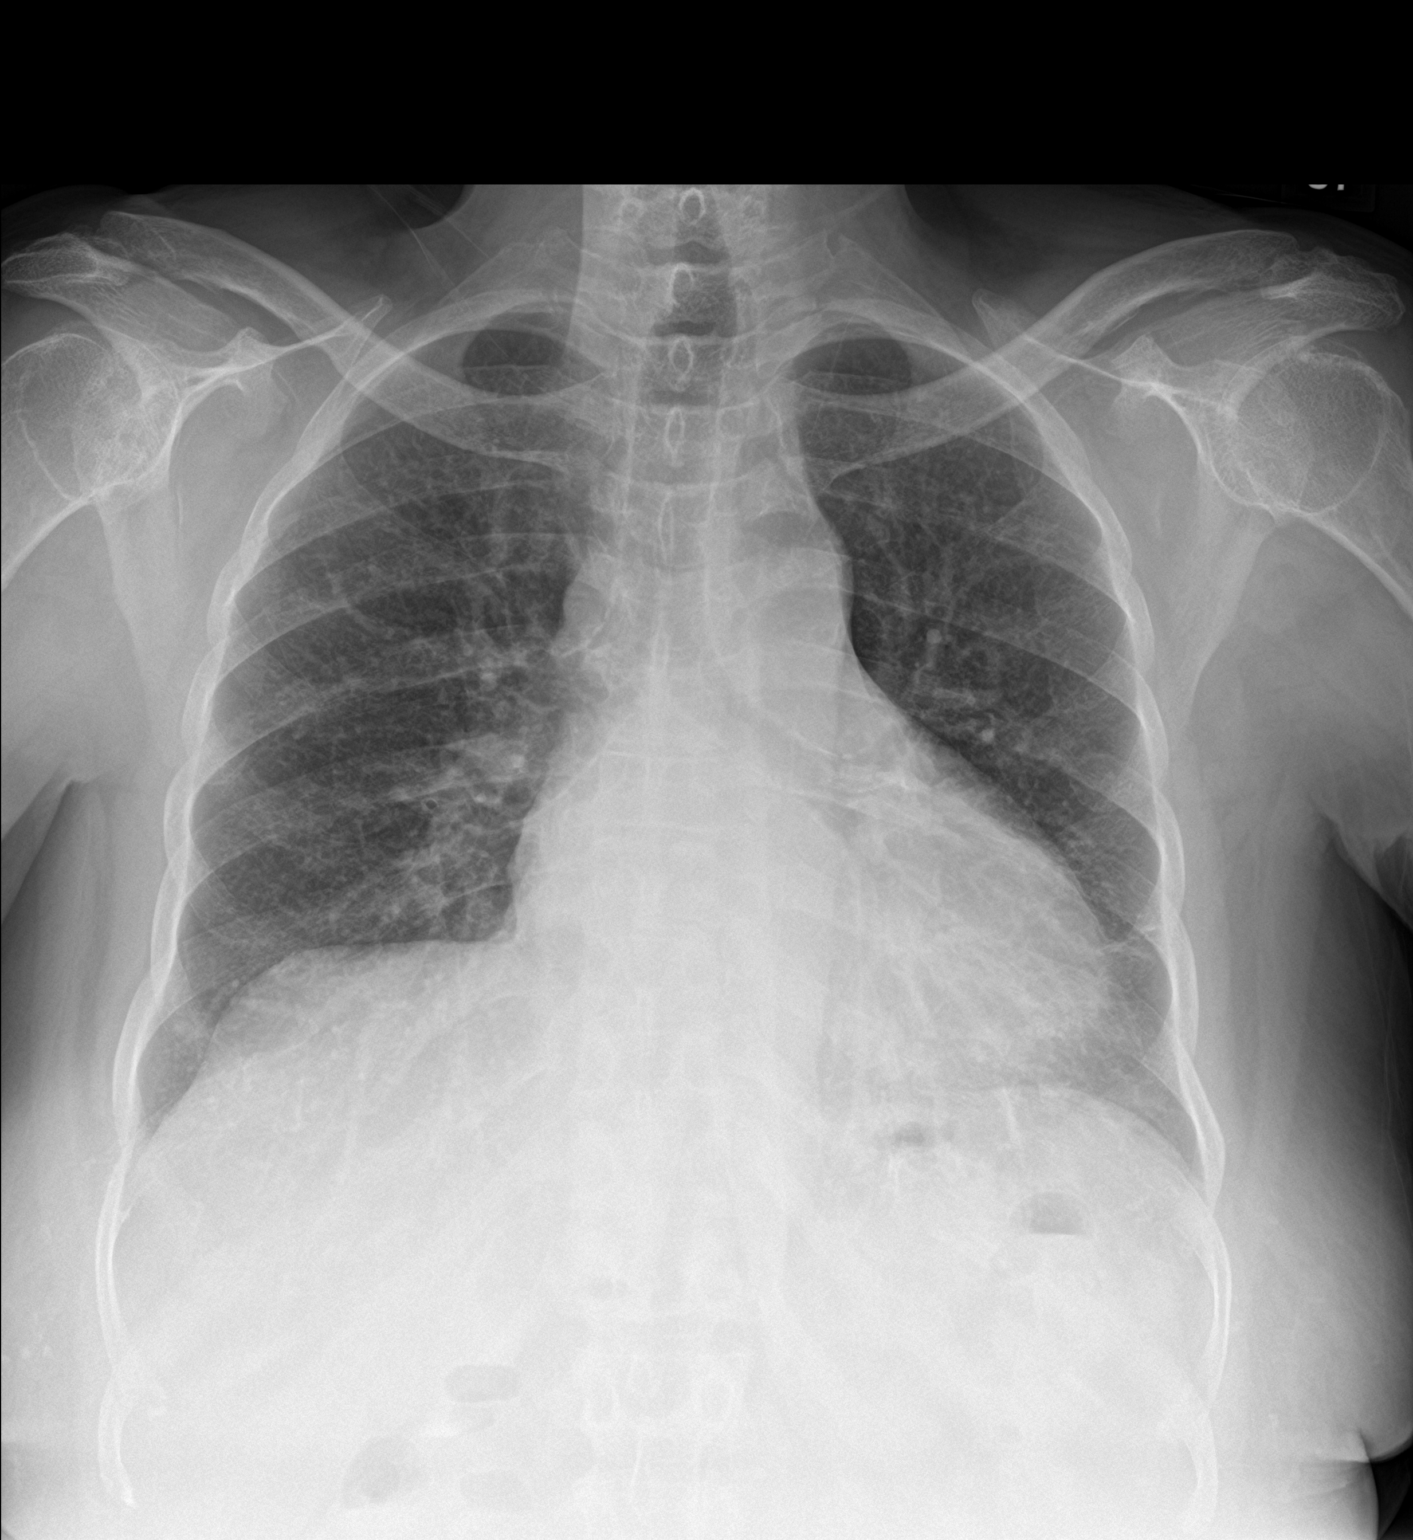

[chest lat]
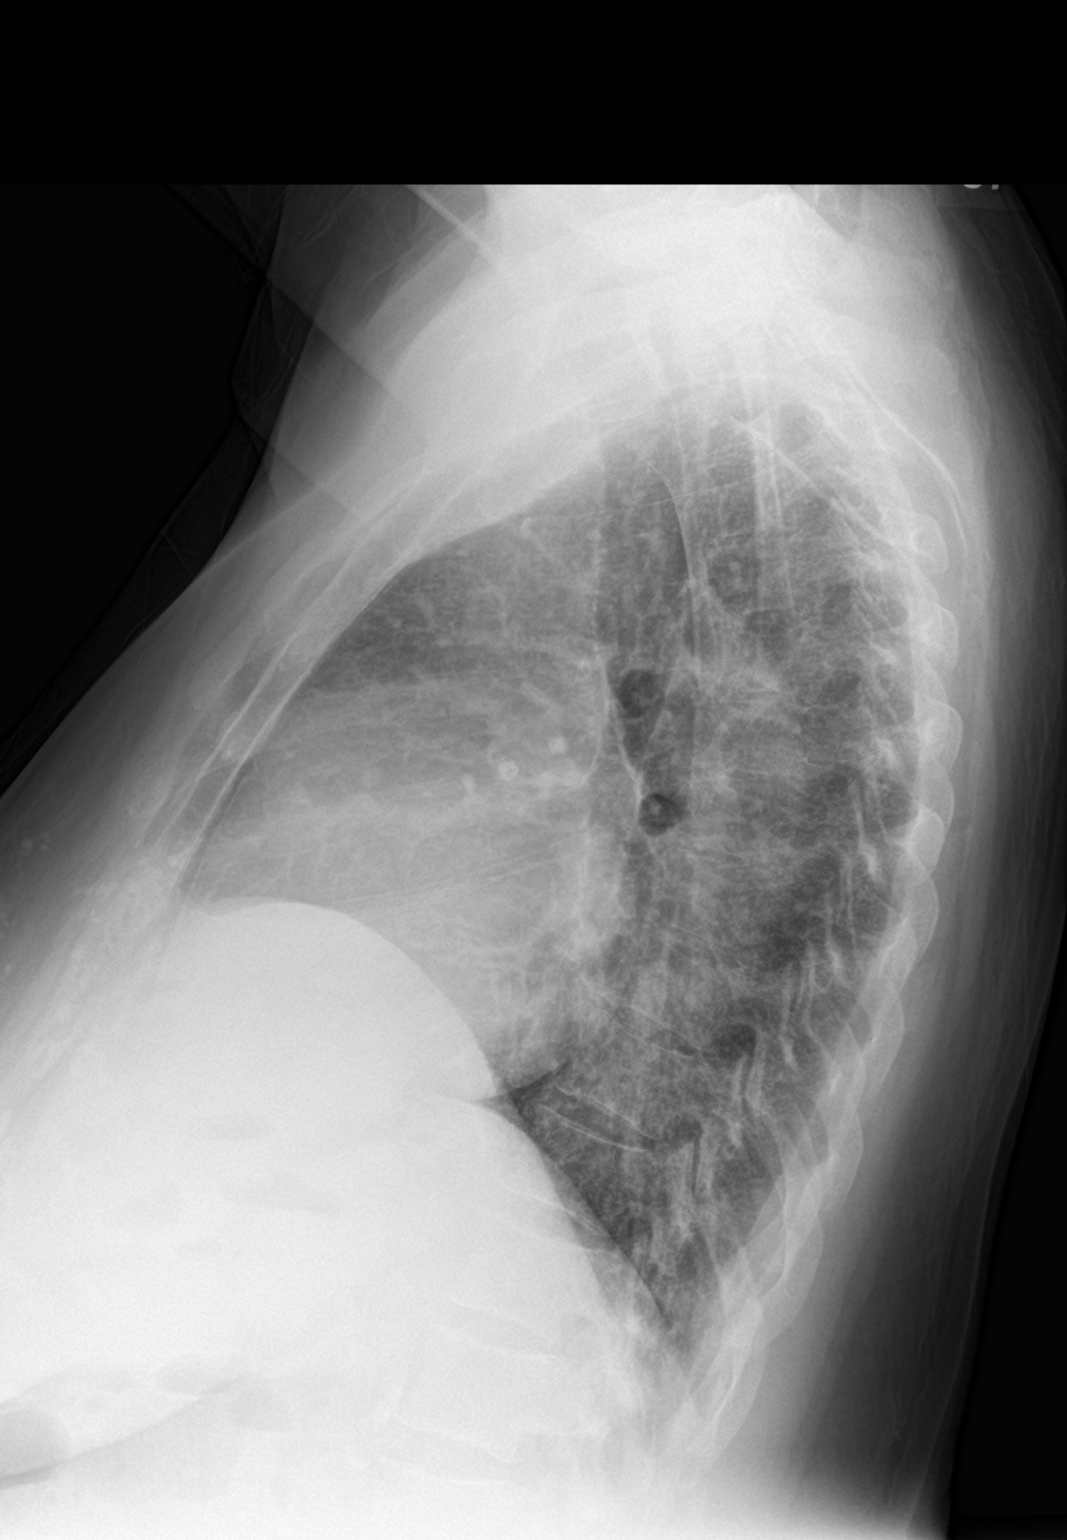

[2 of 2 positions shown; findings below may reference images not displayed]

FINDINGS: Stable borderline cardiomegaly. Linear left basilar opacities
probably represent atelectasis or scarring. No focal consolidation.
No effusion or pneumothorax. Mild degenerative changes of the
thoracic spine.
IMPRESSION: Stable borderline cardiomegaly.  No acute pulmonary process.

By: Fallon Jim M.D.

## 2017-11-05 DIAGNOSIS — D631 Anemia in chronic kidney disease: Secondary | ICD-10-CM | POA: Diagnosis not present

## 2017-11-05 DIAGNOSIS — E876 Hypokalemia: Secondary | ICD-10-CM | POA: Diagnosis not present

## 2017-11-05 DIAGNOSIS — N2581 Secondary hyperparathyroidism of renal origin: Secondary | ICD-10-CM | POA: Diagnosis not present

## 2017-11-05 DIAGNOSIS — E1122 Type 2 diabetes mellitus with diabetic chronic kidney disease: Secondary | ICD-10-CM | POA: Diagnosis not present

## 2017-11-05 DIAGNOSIS — N186 End stage renal disease: Secondary | ICD-10-CM | POA: Diagnosis not present

## 2017-11-08 DIAGNOSIS — E876 Hypokalemia: Secondary | ICD-10-CM | POA: Diagnosis not present

## 2017-11-08 DIAGNOSIS — D631 Anemia in chronic kidney disease: Secondary | ICD-10-CM | POA: Diagnosis not present

## 2017-11-08 DIAGNOSIS — N186 End stage renal disease: Secondary | ICD-10-CM | POA: Diagnosis not present

## 2017-11-08 DIAGNOSIS — E1122 Type 2 diabetes mellitus with diabetic chronic kidney disease: Secondary | ICD-10-CM | POA: Diagnosis not present

## 2017-11-08 DIAGNOSIS — N2581 Secondary hyperparathyroidism of renal origin: Secondary | ICD-10-CM | POA: Diagnosis not present

## 2017-11-10 DIAGNOSIS — D631 Anemia in chronic kidney disease: Secondary | ICD-10-CM | POA: Diagnosis not present

## 2017-11-10 DIAGNOSIS — N2581 Secondary hyperparathyroidism of renal origin: Secondary | ICD-10-CM | POA: Diagnosis not present

## 2017-11-10 DIAGNOSIS — N186 End stage renal disease: Secondary | ICD-10-CM | POA: Diagnosis not present

## 2017-11-10 DIAGNOSIS — E876 Hypokalemia: Secondary | ICD-10-CM | POA: Diagnosis not present

## 2017-11-10 DIAGNOSIS — E1122 Type 2 diabetes mellitus with diabetic chronic kidney disease: Secondary | ICD-10-CM | POA: Diagnosis not present

## 2017-11-12 ENCOUNTER — Other Ambulatory Visit: Payer: Self-pay | Admitting: Nephrology

## 2017-11-12 ENCOUNTER — Ambulatory Visit
Admission: RE | Admit: 2017-11-12 | Discharge: 2017-11-12 | Disposition: A | Discharge status: Home / Self Care | Payer: Medicare Other | Source: Ambulatory Visit | Attending: Nephrology | Admitting: Nephrology

## 2017-11-12 DIAGNOSIS — N186 End stage renal disease: Secondary | ICD-10-CM | POA: Diagnosis not present

## 2017-11-12 DIAGNOSIS — T1490XA Injury, unspecified, initial encounter: Secondary | ICD-10-CM

## 2017-11-12 DIAGNOSIS — S79912A Unspecified injury of left hip, initial encounter: Secondary | ICD-10-CM | POA: Diagnosis not present

## 2017-11-12 DIAGNOSIS — N2581 Secondary hyperparathyroidism of renal origin: Secondary | ICD-10-CM | POA: Diagnosis not present

## 2017-11-12 DIAGNOSIS — D631 Anemia in chronic kidney disease: Secondary | ICD-10-CM | POA: Diagnosis not present

## 2017-11-12 DIAGNOSIS — E876 Hypokalemia: Secondary | ICD-10-CM | POA: Diagnosis not present

## 2017-11-12 DIAGNOSIS — M25552 Pain in left hip: Secondary | ICD-10-CM | POA: Diagnosis not present

## 2017-11-12 DIAGNOSIS — E1122 Type 2 diabetes mellitus with diabetic chronic kidney disease: Secondary | ICD-10-CM | POA: Diagnosis not present

## 2017-11-15 DIAGNOSIS — N2581 Secondary hyperparathyroidism of renal origin: Secondary | ICD-10-CM | POA: Diagnosis not present

## 2017-11-15 DIAGNOSIS — E1122 Type 2 diabetes mellitus with diabetic chronic kidney disease: Secondary | ICD-10-CM | POA: Diagnosis not present

## 2017-11-15 DIAGNOSIS — N186 End stage renal disease: Secondary | ICD-10-CM | POA: Diagnosis not present

## 2017-11-15 DIAGNOSIS — D631 Anemia in chronic kidney disease: Secondary | ICD-10-CM | POA: Diagnosis not present

## 2017-11-15 DIAGNOSIS — E876 Hypokalemia: Secondary | ICD-10-CM | POA: Diagnosis not present

## 2017-11-17 ENCOUNTER — Other Ambulatory Visit: Payer: Self-pay | Admitting: Internal Medicine

## 2017-11-17 DIAGNOSIS — N186 End stage renal disease: Secondary | ICD-10-CM | POA: Diagnosis not present

## 2017-11-17 DIAGNOSIS — N2581 Secondary hyperparathyroidism of renal origin: Secondary | ICD-10-CM | POA: Diagnosis not present

## 2017-11-17 DIAGNOSIS — E1122 Type 2 diabetes mellitus with diabetic chronic kidney disease: Secondary | ICD-10-CM | POA: Diagnosis not present

## 2017-11-17 DIAGNOSIS — E876 Hypokalemia: Secondary | ICD-10-CM | POA: Diagnosis not present

## 2017-11-17 DIAGNOSIS — D631 Anemia in chronic kidney disease: Secondary | ICD-10-CM | POA: Diagnosis not present

## 2017-11-18 ENCOUNTER — Ambulatory Visit: Payer: Medicare Other | Admitting: Internal Medicine

## 2017-11-18 ENCOUNTER — Ambulatory Visit (INDEPENDENT_AMBULATORY_CARE_PROVIDER_SITE_OTHER)
Admission: RE | Admit: 2017-11-18 | Discharge: 2017-11-18 | Disposition: A | Discharge status: Home / Self Care | Payer: Medicare Other | Source: Ambulatory Visit | Attending: Internal Medicine | Admitting: Internal Medicine

## 2017-11-18 ENCOUNTER — Encounter: Payer: Self-pay | Admitting: Internal Medicine

## 2017-11-18 ENCOUNTER — Ambulatory Visit (INDEPENDENT_AMBULATORY_CARE_PROVIDER_SITE_OTHER): Payer: Medicare Other | Admitting: Internal Medicine

## 2017-11-18 VITALS — BP 128/76 | HR 83 | Temp 98.4°F | Ht 59.0 in | Wt 138.0 lb

## 2017-11-18 DIAGNOSIS — M545 Low back pain, unspecified: Secondary | ICD-10-CM

## 2017-11-18 DIAGNOSIS — S79912A Unspecified injury of left hip, initial encounter: Secondary | ICD-10-CM | POA: Diagnosis not present

## 2017-11-18 DIAGNOSIS — M25552 Pain in left hip: Secondary | ICD-10-CM

## 2017-11-18 DIAGNOSIS — I10 Essential (primary) hypertension: Secondary | ICD-10-CM | POA: Diagnosis not present

## 2017-11-18 DIAGNOSIS — S3992XA Unspecified injury of lower back, initial encounter: Secondary | ICD-10-CM | POA: Diagnosis not present

## 2017-11-18 NOTE — Patient Instructions (Addendum)
Please continue all other medications as before, and refills have been done if requested.  Please have the pharmacy call with any other refills you may need.  Please continue your efforts at being more active, low cholesterol diet, and weight control.  Please keep your appointments with your specialists as you may have planned  Please go to the XRAY Department in the Basement (go straight as you get off the elevator) for the x-ray testing  You will be contacted by phone if any changes need to be made immediately.  Otherwise, you will receive a letter about your results with an explanation, but please check with MyChart first.  Please remember to sign up for MyChart if you have not done so, as this will be important to you in the future with finding out test results, communicating by private email, and scheduling acute appointments online when needed.

## 2017-11-18 NOTE — Progress Notes (Signed)
Subjective:    Patient ID: Carolyn Valenzuela, female    DOB: 16-Sep-1950, 67 y.o.   MRN: [id number]  HPI  Here to f/u  - [place] accidentally at dialysis 2 wks ago, occurred right after HD session, but she is not sure if low BP at the time, still has quite sore left hip, mild to mod, constant, worse to lie on it, no real swelling or bruising, worried about "the bone sticking out" when she feels it through her clothes, has some mid and left lower back pain as well, advised per Dr C to f/u here.  Pt denies bowel or bladder change, fever, wt loss,  worsening LE pain/numbness/weakness, gait change or falls. Pt denies chest pain, increased sob or doe, wheezing, orthopnea, PND, increased LE swelling, palpitations, dizziness or syncope.   Past Medical History:  Diagnosis Date  . Allergic rhinitis, cause unspecified 12/08/2010  . ANEMIA-IRON DEFICIENCY 01/25/2008  . Aortic regurgitation   . CAD (coronary artery disease)    a. 07/2016 NSTEMI/PCI: LM nl, LAD 70ost, 81m/d, 80d, D1 40ost, LCX 99ost/p (2.5x16 Synergy DES - 2.75), 40/43m, Om2 100 CTO, RCA  30p/m. Pt eval by CT surgery, not felt to be suitable candidate 2/2 comorbidities.  . CERVICAL RADICULOPATHY, LEFT 01/25/2008  . Chronic systolic CHF (congestive heart failure) ([place])    a. 07/2016 Echo: EF 40-45%, diff HK, sev AI, mild MVP w/ mod to sev MR, PASP 25mmHg.  . Critical lower limb ischemia   . DIABETES MELLITUS, UNCONTROLLED 05/21/2009  . Esophagitis   . ESRD on dialysis ([place])   . Foot ulcer ([place])    right lateral malleolus  . HCAP (healthcare-associated pneumonia) 06/29/2016  . HYPERLIPIDEMIA 03/30/2007  . HYPERTENSION 01/25/2008  . Ischemic cardiomyopathy    a. 07/2016 Echo: EF 40-45%.  . Mitral regurgitation    a. 07/2016 Echo: Mild MVP w/ mod to sev MR.  [name] PVD (peripheral vascular disease) ([place])    a. s/p bilat BKA  . SECONDARY HYPERPARATHYROIDISM 05/21/2009  . Severe aortic regurgitation    a. 07/2016 Echo: Severe AI.   Past Surgical  History:  Procedure Laterality Date  . AMPUTATION Right 03/15/2014   Procedure: RIGHT  LEG  BELOW KNEE AMPUTATION ;  Surgeon: [name], MD;  Location: [facility];  Service: Orthopedics;  Laterality: Right;  . AMPUTATION Left 11/29/2015   Procedure: AMPUTATION BELOW KNEE;  Surgeon: Newt Minion, MD;  Location: [facility];  Service: Orthopedics;  Laterality: Left;  . AV FISTULA PLACEMENT Left   . AV FISTULA PLACEMENT Left 09/06/2014   Procedure: ARTERIOVENOUS (AV) FISTULA CREATION-Left Brachiocephalic;  Surgeon: Conrad [place], MD;  Location: [facility];  Service: Vascular;  Laterality: Left;  . CARDIAC CATHETERIZATION N/A 06/30/2016   Procedure: Left Heart Cath and Coronary Angiography;  Surgeon: Burnell Blanks, MD;  Location: [facility];  Service: Cardiovascular;  Laterality: N/A;  . CARDIAC CATHETERIZATION N/A 07/02/2016   Procedure: Coronary Stent Intervention;  Surgeon: Nelva Bush, MD;  Location: [facility];  Service: Cardiovascular;  Laterality: N/A;  . CATARACT EXTRACTION    . COLONOSCOPY    . CORONARY ANGIOPLASTY WITH STENT PLACEMENT  07/02/2016  . ESOPHAGOGASTRODUODENOSCOPY N/A 03/20/2016   Procedure: ESOPHAGOGASTRODUODENOSCOPY (EGD);  Surgeon: Milus Banister, MD;  Location: [facility];  Service: Endoscopy;  Laterality: N/A;  . EYE SURGERY Bilateral    cataracts removed, left eye still has some oil in it.  . INSERTION OF DIALYSIS CATHETER N/A 09/03/2014   Procedure:  INSERTION OF DIALYSIS CATHETER RIGHT INTERNAL JUGULAR VEIN;  Surgeon: Conrad Salem, MD;  Location: [facility];  Service: Vascular;  Laterality: N/A;  . PERIPHERAL VASCULAR CATHETERIZATION N/A 08/13/2015   Procedure: Abdominal Aortogram;  Surgeon: Elam Dutch, MD;  Location: [facility];  Service: Cardiovascular;  Laterality: N/A;    reports that she has never smoked. She has never used smokeless tobacco. She reports that she does not drink alcohol or use drugs. family history includes Cancer in her  mother. Allergies  Allergen Reactions  . Oxycodone Itching   Current Outpatient Medications on File Prior to Visit  Medication Sig Dispense Refill  . aspirin 81 MG EC tablet Take 81 mg by mouth daily.      . clopidogrel (PLAVIX) 75 MG tablet TAKE 1 TABLET BY MOUTH EVERY DAY 90 tablet 3  . famotidine (PEPCID) 20 MG tablet Take 1 tablet (20 mg total) by mouth daily. 90 tablet 3  . gabapentin (NEURONTIN) 100 MG capsule TAKE 2 CAPSULES (200 MG TOTAL) BY MOUTH 3 (THREE) TIMES DAILY. 540 capsule 1  . glipiZIDE (GLUCOTROL XL) 10 MG 24 hr tablet TAKE 1 TABLET BY MOUTH DAILY WITH BREAKFAST. 90 tablet 3  . glucose blood (ONE TOUCH ULTRA TEST) test strip Use as instructed, twice daily code E11.52 200 each 12  . lidocaine-prilocaine (EMLA) cream APPLY A SMALL AMOUNT TO SKIN 3 TIMES A WEEK OVER DIALYSIS SHUNT 30 MINUTES PRIOR TO DIALYSIS 30 g 5  . losartan (COZAAR) 100 MG tablet Take 1 tablet (100 mg total) by mouth daily. 90 tablet 3  . multivitamin (RENA-VIT) TABS tablet TAKE 1 TABLET BY MOUTH EVERY DAY AT BEDTIME 30 tablet 11  . ONETOUCH VERIO test strip USE TO CHECK BLOOD SUGARS TWICE A DAY DX E11.9 100 each 5  . pantoprazole (PROTONIX) 40 MG tablet TAKE 1 TABLET BY MOUTH TWICE A DAY BEFORE MEALS 60 tablet 5  . saccharomyces boulardii (FLORASTOR) 250 MG capsule Take 1 capsule (250 mg total) by mouth daily. 90 capsule 3  . sevelamer carbonate (RENVELA) 800 MG tablet Take 1,600 mg by mouth 3 (three) times daily with meals.    . sitaGLIPtin (JANUVIA) 50 MG tablet Take 1 tablet (50 mg total) by mouth daily. 90 tablet 3   Current Facility-Administered Medications on File Prior to Visit  Medication Dose Route Frequency Provider Last Rate Last Dose  . Bevacizumab (AVASTIN) SOLN 1.25 mg  1.25 mg Intravitreal  Bernarda Caffey, MD   1.25 mg at 10/26/17 1520   Review of Systems  Constitutional: Negative for other unusual diaphoresis or sweats HENT: Negative for ear discharge or swelling Eyes: Negative for  other worsening visual disturbances Respiratory: Negative for stridor or other swelling  Gastrointestinal: Negative for worsening distension or other blood Genitourinary: Negative for retention or other urinary change Musculoskeletal: Negative for other MSK pain or swelling Skin: Negative for color change or other new lesions Neurological: Negative for worsening tremors and other numbness  Psychiatric Behavioral: Negative for worsening agitation or other fatigue All other system neg per pt    Objective:   Physical Exam BP 128/76   Pulse 83   Temp 98.4 F (36.9 C) (Oral)   Ht 4\' 11"  (1.499 m)   Wt 138 lb (62.6 kg)   SpO2 97%   BMI 27.87 kg/m  VS noted,  Constitutional: Pt appears in NAD HENT: Head: NCAT.  Right Ear: External ear normal.  Left Ear: External ear normal.  Eyes: . Pupils are equal,  round, and reactive to light. Conjunctivae and EOM are normal Nose: without d/c or deformity Neck: Neck supple. Gross normal ROM Cardiovascular: Normal rate and regular rhythm.   Pulmonary/Chest: Effort normal and breath sounds without rales or wheezing.  Abd:  Soft, NT, ND, + BS, no organomegaly Neurological: Pt is alert. At baseline orientation, motor grossly intact Skin: Skin is warm. No rashes, other new lesions, no LE edema Psychiatric: Pt behavior is normal without agitation  Spine nontender in midline, does have mild tenderness over left greater trochanter       Assessment & Plan:

## 2017-11-19 DIAGNOSIS — E1122 Type 2 diabetes mellitus with diabetic chronic kidney disease: Secondary | ICD-10-CM | POA: Diagnosis not present

## 2017-11-19 DIAGNOSIS — N186 End stage renal disease: Secondary | ICD-10-CM | POA: Diagnosis not present

## 2017-11-19 DIAGNOSIS — D631 Anemia in chronic kidney disease: Secondary | ICD-10-CM | POA: Diagnosis not present

## 2017-11-19 DIAGNOSIS — N2581 Secondary hyperparathyroidism of renal origin: Secondary | ICD-10-CM | POA: Diagnosis not present

## 2017-11-19 DIAGNOSIS — E876 Hypokalemia: Secondary | ICD-10-CM | POA: Diagnosis not present

## 2017-11-20 NOTE — Assessment & Plan Note (Signed)
Unclear etiology, cant r/o subclinical vertebral fx, for films today, cont pain management

## 2017-11-20 NOTE — Assessment & Plan Note (Signed)
stable overall by history and exam, recent data reviewed with pt, and pt to continue medical treatment as before,  to f/u any worsening symptoms or concerns  

## 2017-11-20 NOTE — Assessment & Plan Note (Signed)
Also for left hip films, r/o subclinical fx

## 2017-11-22 DIAGNOSIS — N186 End stage renal disease: Secondary | ICD-10-CM | POA: Diagnosis not present

## 2017-11-22 DIAGNOSIS — N2581 Secondary hyperparathyroidism of renal origin: Secondary | ICD-10-CM | POA: Diagnosis not present

## 2017-11-22 DIAGNOSIS — E876 Hypokalemia: Secondary | ICD-10-CM | POA: Diagnosis not present

## 2017-11-22 DIAGNOSIS — D631 Anemia in chronic kidney disease: Secondary | ICD-10-CM | POA: Diagnosis not present

## 2017-11-22 DIAGNOSIS — E1122 Type 2 diabetes mellitus with diabetic chronic kidney disease: Secondary | ICD-10-CM | POA: Diagnosis not present

## 2017-11-24 DIAGNOSIS — E1122 Type 2 diabetes mellitus with diabetic chronic kidney disease: Secondary | ICD-10-CM | POA: Diagnosis not present

## 2017-11-24 DIAGNOSIS — E876 Hypokalemia: Secondary | ICD-10-CM | POA: Diagnosis not present

## 2017-11-24 DIAGNOSIS — N186 End stage renal disease: Secondary | ICD-10-CM | POA: Diagnosis not present

## 2017-11-24 DIAGNOSIS — D631 Anemia in chronic kidney disease: Secondary | ICD-10-CM | POA: Diagnosis not present

## 2017-11-24 DIAGNOSIS — N2581 Secondary hyperparathyroidism of renal origin: Secondary | ICD-10-CM | POA: Diagnosis not present

## 2017-11-25 NOTE — Progress Notes (Signed)
[facility] Note  11/30/2017     CHIEF COMPLAINT Patient presents for Retina Follow Up   HISTORY OF PRESENT ILLNESS: Carolyn Valenzuela is a 67 y.o. female who presents to the clinic today for:   HPI    Retina Follow Up    Patient presents with  Diabetic Retinopathy.  In right eye.  Severity is moderate.  Since onset it is stable.  I, the attending physician,  performed the HPI with the patient and updated documentation appropriately.          Comments    67 y/o female pt here for 1 mo f/u for PDR w/ ME OD.  Vision OD a little better.  No change in vision os.  Denies eye pain, but has been having some headaches.  Denies flashes OU.  Reports a few small floaters os.  Using Restasis bid ou, and an unknown gtt od.  Glucose was 222 today, and last A1C was 7.5.  Date of last A1C unknown.       Last edited by Bernarda Caffey, MD on 12/01/2017  1:28 PM. (History)      Referring physician: Biagio Borg, MD [place], [place] [address]  HISTORICAL INFORMATION:   Selected notes from the MEDICAL RECORD NUMBER Referred by Dr. Shirley Muscat for concern of DME OD and hard exudates OS;  LEE- 02.21.19 (S. Bernstorf) [BCVA OD: 20/40 OS: 20/40] Ocular Hx- hx of TRD OD, S/P vitrectomy OD (03/32/10 - [facility]), CR scar OU, DME OU, PDR OU, VH OU, pseudophakia OU (04-12/2006), S/P focal laser OU (06.01.2008), S/P PRP laser (12.08.2009 - [facility], undoctumented with eye), S/p PPV OS (2014 - [facility]);  PMH- DM, HTN, elevated cholesterol, kidney disorder    CURRENT MEDICATIONS: No current outpatient medications on file. (Ophthalmic Drugs)   No current facility-administered medications for this visit.  (Ophthalmic Drugs)   Current Outpatient Medications (Other)  Medication Sig  . aspirin 81 MG EC tablet Take 81 mg by mouth daily.    . clopidogrel (PLAVIX) 75 MG tablet TAKE 1 TABLET BY MOUTH EVERY DAY  . famotidine (PEPCID) 20 MG tablet Take 1 tablet (20 mg total) by  mouth daily.  [name] gabapentin (NEURONTIN) 100 MG capsule TAKE 2 CAPSULES (200 MG TOTAL) BY MOUTH 3 (THREE) TIMES DAILY.  [name] glipiZIDE (GLUCOTROL XL) 10 MG 24 hr tablet TAKE 1 TABLET BY MOUTH DAILY WITH BREAKFAST.  [name] glucose blood (ONE TOUCH ULTRA TEST) test strip Use as instructed, twice daily code E11.52  . lidocaine-prilocaine (EMLA) cream APPLY A SMALL AMOUNT TO SKIN 3 TIMES A WEEK OVER DIALYSIS SHUNT 30 MINUTES PRIOR TO DIALYSIS  . lidocaine-prilocaine (EMLA) cream APPLY A SMALL AMOUNT TO SKIN 3 TIMES A WEEK OVER DIALYSIS SHUNT 30 MINUTES PRIOR TO DIALYSIS  . losartan (COZAAR) 100 MG tablet Take 1 tablet (100 mg total) by mouth daily.  . multivitamin (RENA-VIT) TABS tablet TAKE 1 TABLET BY MOUTH EVERY DAY AT BEDTIME  . ONETOUCH VERIO test strip USE TO CHECK BLOOD SUGARS TWICE A DAY DX E11.9  . pantoprazole (PROTONIX) 40 MG tablet TAKE 1 TABLET BY MOUTH TWICE A DAY BEFORE MEALS  . saccharomyces boulardii (FLORASTOR) 250 MG capsule Take 1 capsule (250 mg total) by mouth daily.  . sevelamer carbonate (RENVELA) 800 MG tablet Take 1,600 mg by mouth 3 (three) times daily with meals.  . sitaGLIPtin (JANUVIA) 50 MG tablet Take 1 tablet (50 mg total) by mouth daily.  Current Facility-Administered Medications (Other)  Medication Route  . Bevacizumab (AVASTIN) SOLN 1.25 mg Intravitreal  . Bevacizumab (AVASTIN) SOLN 1.25 mg Intravitreal      REVIEW OF SYSTEMS: ROS    Positive for: Endocrine, Eyes   Negative for: Constitutional, Gastrointestinal, Neurological, Skin, Genitourinary, Musculoskeletal, HENT, Cardiovascular, Respiratory, Psychiatric, Allergic/Imm, Heme/Lymph   Last edited by Matthew Folks, COA on 11/30/2017  2:07 PM. (History)       ALLERGIES Allergies  Allergen Reactions  . Oxycodone Itching    PAST MEDICAL HISTORY Past Medical History:  Diagnosis Date  . Allergic rhinitis, cause unspecified 12/08/2010  . ANEMIA-IRON DEFICIENCY 01/25/2008  . Aortic regurgitation   . CAD  (coronary artery disease)    a. 07/2016 NSTEMI/PCI: LM nl, LAD 70ost, 48m/d, 80d, D1 40ost, LCX 99ost/p (2.5x16 Synergy DES - 2.75), 40/10m, Om2 100 CTO, RCA  30p/m. Pt eval by CT surgery, not felt to be suitable candidate 2/2 comorbidities.  . CERVICAL RADICULOPATHY, LEFT 01/25/2008  . Chronic systolic CHF (congestive heart failure) ([place])    a. 07/2016 Echo: EF 40-45%, diff HK, sev AI, mild MVP w/ mod to sev MR, PASP 61mmHg.  . Critical lower limb ischemia   . DIABETES MELLITUS, UNCONTROLLED 05/21/2009  . Esophagitis   . ESRD on dialysis ([place])   . Foot ulcer ([place])    right lateral malleolus  . HCAP (healthcare-associated pneumonia) 06/29/2016  . HYPERLIPIDEMIA 03/30/2007  . HYPERTENSION 01/25/2008  . Ischemic cardiomyopathy    a. 07/2016 Echo: EF 40-45%.  . Mitral regurgitation    a. 07/2016 Echo: Mild MVP w/ mod to sev MR.  [name] PVD (peripheral vascular disease) ([place])    a. s/p bilat BKA  . SECONDARY HYPERPARATHYROIDISM 05/21/2009  . Severe aortic regurgitation    a. 07/2016 Echo: Severe AI.   Past Surgical History:  Procedure Laterality Date  . AMPUTATION Right 03/15/2014   Procedure: RIGHT  LEG  BELOW KNEE AMPUTATION ;  Surgeon: [name], MD;  Location: [facility];  Service: Orthopedics;  Laterality: Right;  . AMPUTATION Left 11/29/2015   Procedure: AMPUTATION BELOW KNEE;  Surgeon: Newt Minion, MD;  Location: [facility];  Service: Orthopedics;  Laterality: Left;  . AV FISTULA PLACEMENT Left   . AV FISTULA PLACEMENT Left 09/06/2014   Procedure: ARTERIOVENOUS (AV) FISTULA CREATION-Left Brachiocephalic;  Surgeon: Conrad Stillwater, MD;  Location: [facility];  Service: Vascular;  Laterality: Left;  . CARDIAC CATHETERIZATION N/A 06/30/2016   Procedure: Left Heart Cath and Coronary Angiography;  Surgeon: Burnell Blanks, MD;  Location: [facility];  Service: Cardiovascular;  Laterality: N/A;  . CARDIAC CATHETERIZATION N/A 07/02/2016   Procedure: Coronary Stent Intervention;  Surgeon: Nelva Bush, MD;  Location: [facility];  Service: Cardiovascular;  Laterality: N/A;  . CATARACT EXTRACTION    . COLONOSCOPY    . CORONARY ANGIOPLASTY WITH STENT PLACEMENT  07/02/2016  . ESOPHAGOGASTRODUODENOSCOPY N/A 03/20/2016   Procedure: ESOPHAGOGASTRODUODENOSCOPY (EGD);  Surgeon: Milus Banister, MD;  Location: [facility];  Service: Endoscopy;  Laterality: N/A;  . EYE SURGERY Bilateral    cataracts removed, left eye still has some oil in it.  . INSERTION OF DIALYSIS CATHETER N/A 09/03/2014   Procedure: INSERTION OF DIALYSIS CATHETER RIGHT INTERNAL JUGULAR VEIN;  Surgeon: Conrad , MD;  Location: [facility];  Service: Vascular;  Laterality: N/A;  . PERIPHERAL VASCULAR CATHETERIZATION N/A 08/13/2015   Procedure: Abdominal Aortogram;  Surgeon: Elam Dutch, MD;  Location: [facility];  Service: Cardiovascular;  Laterality: N/A;    FAMILY HISTORY Family History  Problem Relation Age of Onset  . Cancer Mother        Pancreatic    SOCIAL HISTORY Social History   Tobacco Use  . Smoking status: Never Smoker  . Smokeless tobacco: Never Used  Substance Use Topics  . Alcohol use: No    Alcohol/week: 0.0 oz  . Drug use: No         OPHTHALMIC EXAM:  Base Eye Exam    Visual Acuity (Snellen - Linear)      Right Left   Dist cc 20/40 +2 20/40 -1   Dist ph cc NI NI   Correction:  Glasses       Tonometry (Tonopen, 2:05 PM)      Right Left   Pressure 14 17       Pupils      Dark Light Shape React APD   Right 2 1.5 Round Minimal None   Left 2 1.5 Round Minimal None       Visual Fields (Counting fingers)      Left Right    Full    Restrictions  Partial outer superior nasal, inferior nasal deficiencies       Extraocular Movement      Right Left    Full Full       Neuro/Psych    Oriented x3:  Yes   Mood/Affect:  Normal       Dilation    Both eyes:  1.0% Mydriacyl, 2.5% Phenylephrine @ 2:05 PM        Slit Lamp and Fundus Exam    Slit Lamp Exam       Right Left   Lids/Lashes dermatochalasis dermatochalasis   Conjunctiva/Sclera White and quiet White and quiet   Cornea arcus; 1+ PEE; 2+ guttae arcus; 1-2+ guttae   Anterior Chamber Deep and quiet Deep and quiet   Iris Round, dilated; no NVI Round, dilated; large PI at 0600 no NVI   Lens PCIOL in good position PCIOL in good position   Vitreous clear clear       Fundus Exam      Right Left   Disc Mild Pallor Mild Pallor   C/D Ratio 0.3 0.35   Macula Scattered MAs, dot hemorrhages, exudates, blunted foveal reflex with positive edema Blunted foveal reflex, scattered Exudates, Retinal pigment epithelial mottling, focal laser scars   Vessels Vascular attenuation, sclerotic arterioles sclerotic arterioles, generalized attenuation   Periphery 360 PRP, scattered areas of fibrosis 360 PRP in place, areas of fibrosis          IMAGING AND PROCEDURES  Imaging and Procedures for 12/01/17  OCT, Retina - OU - Both Eyes       Right Eye Quality was good. Central Foveal Thickness: 288. Progression has improved. Findings include abnormal foveal contour, no SRF, intraretinal fluid, epiretinal membrane, intraretinal hyper-reflective material, inner retinal atrophy.   Left Eye Quality was good. Central Foveal Thickness: 281. Progression has been stable. Findings include abnormal foveal contour, no IRF, no SRF, macular pucker, epiretinal membrane, outer retinal atrophy.   Notes *Images captured and stored on drive  Diagnosis / Impression:  OD: DME; ERM - mild interval improvement in IRF OS: no DME; +ERM w/ some pucker and preretinal fibrosis   Clinical management:  See below  Abbreviations: NFP - Normal foveal profile. CME - cystoid macular edema. PED - pigment epithelial detachment. IRF - intraretinal fluid. SRF - subretinal fluid. EZ -  ellipsoid zone. ERM - epiretinal membrane. ORA - outer retinal atrophy. ORT - outer retinal tubulation. SRHM - subretinal hyper-reflective material          Intravitreal Injection, Pharmacologic Agent - OD - Right Eye       Time Out 11/30/2017. 3:29 PM. Confirmed correct patient, procedure, site, and patient consented.   Anesthesia Topical anesthesia was used. Anesthetic medications included Lidocaine 2%, Tetracaine 0.5%.   Procedure Preparation included 5% betadine to ocular surface, eyelid speculum. A supplied needle was used.   Injection: 1.25 mg Bevacizumab 1.25mg /0.49ml   NDC: 94174-081-44    Lot: [id number]     Expiration Date: 01/03/2018   Route: Intravitreal   Site: Right Eye   Waste: 0 mg  Post-op Post injection exam found visual acuity of at least counting fingers. The patient tolerated the procedure well. There were no complications. The patient received written and verbal post procedure care education.                 ASSESSMENT/PLAN:    ICD-10-CM   1. Proliferative diabetic retinopathy of right eye with macular edema associated with type 2 diabetes mellitus (HCC) 18/01/2018 Intravitreal Injection, Pharmacologic Agent - OD - Right Eye    Bevacizumab (AVASTIN) SOLN 1.25 mg  2. Stable proliferative diabetic retinopathy of left eye associated with type 2 diabetes mellitus ([place]) [address]   3. Retinal edema H35.81 OCT, Retina - OU - Both Eyes  4. Pseudophakia of both eyes Z96.1   5. Corneal guttata H18.51     1,2,3. Proliferative diabetic retinopathy OU- - The incidence, risk factors for progression, natural history and treatment options for diabetic retinopathy  were discussed with patient.   - The need for close monitoring of blood glucose, blood pressure, and serum lipids, avoiding cigarette or any type of tobacco, and the need for long term follow up was also discussed with patient. - s/p PPV OU with [facility] in 2014 - s/p PRP OU with [facility] - S/P IVA OD #1 (02.26.19)  - exam shows good PRP 360 OU; sclerotic arterioles with sheathing OU; scattered DBH OU - OCT shows persistent diabetic macular edema, OD -  recommend IVA #2 OD today, 04.02.19 - pt wishes to proceed - RBA of procedure discussed, questions answered - informed consent obtained and signed - see procedure note - f/u in 4 wks  4. Pseudophakia OU  - s/p CE/IOL OU by Dr. 17.02.19  - beautiful surgery, doing well  - monitor  5. Corneal guttata OU - no edema - monitor   Ophthalmic Meds Ordered this visit:  Meds ordered this encounter  Medications  . Bevacizumab (AVASTIN) SOLN 1.25 mg       Return in about 1 month (around 12/28/2017) for F/U PDR OU.  There are no Patient Instructions on file for this visit.   Explained the diagnoses, plan, and follow up with the patient and they expressed understanding.  Patient expressed understanding of the importance of proper follow up care.   This document serves as a record of services personally performed by 01/10/2018, MD, PhD. It was created on their behalf by Gardiner Sleeper, [place], a certified ophthalmic assistant. The creation of this record is the provider's dictation and/or activities during the visit.  Electronically signed by: [address], [place]  12/01/17 1:28 PM    17/03/19, M.D., Ph.D. Diseases & Surgery of the Retina and [place] 12/01/17  I have reviewed the above documentation for  accuracy and completeness, and I agree with the above. Gardiner Sleeper, M.D., Ph.D. 12/01/17 1:30 PM     Abbreviations: M myopia (nearsighted); A astigmatism; H hyperopia (farsighted); P presbyopia; Mrx spectacle prescription;  CTL contact lenses; OD right eye; OS left eye; OU both eyes  XT exotropia; ET esotropia; PEK punctate epithelial keratitis; PEE punctate epithelial erosions; DES dry eye syndrome; MGD meibomian gland dysfunction; ATs artificial tears; PFAT's preservative free artificial tears; [place] nuclear sclerotic cataract; PSC posterior subcapsular cataract; ERM epi-retinal membrane; PVD posterior vitreous detachment; RD retinal  detachment; DM diabetes mellitus; DR diabetic retinopathy; NPDR non-proliferative diabetic retinopathy; PDR proliferative diabetic retinopathy; CSME clinically significant macular edema; DME diabetic macular edema; dbh dot blot hemorrhages; CWS cotton wool spot; POAG primary open angle glaucoma; C/D cup-to-disc ratio; HVF humphrey visual field; GVF goldmann visual field; OCT optical coherence tomography; IOP intraocular pressure; BRVO Branch retinal vein occlusion; CRVO central retinal vein occlusion; CRAO central retinal artery occlusion; BRAO branch retinal artery occlusion; RT retinal tear; SB scleral buckle; PPV pars plana vitrectomy; VH Vitreous hemorrhage; PRP panretinal laser photocoagulation; IVK intravitreal kenalog; VMT vitreomacular traction; MH Macular hole;  NVD neovascularization of the disc; NVE neovascularization elsewhere; AREDS age related eye disease study; ARMD age related macular degeneration; POAG primary open angle glaucoma; EBMD epithelial/anterior basement membrane dystrophy; ACIOL anterior chamber intraocular lens; IOL intraocular lens; PCIOL posterior chamber intraocular lens; Phaco/IOL phacoemulsification with intraocular lens placement; [place] photorefractive keratectomy; LASIK laser assisted in situ keratomileusis; HTN hypertension; DM diabetes mellitus; COPD chronic obstructive pulmonary disease

## 2017-11-26 DIAGNOSIS — N2581 Secondary hyperparathyroidism of renal origin: Secondary | ICD-10-CM | POA: Diagnosis not present

## 2017-11-26 DIAGNOSIS — E876 Hypokalemia: Secondary | ICD-10-CM | POA: Diagnosis not present

## 2017-11-26 DIAGNOSIS — D631 Anemia in chronic kidney disease: Secondary | ICD-10-CM | POA: Diagnosis not present

## 2017-11-26 DIAGNOSIS — N186 End stage renal disease: Secondary | ICD-10-CM | POA: Diagnosis not present

## 2017-11-26 DIAGNOSIS — E1122 Type 2 diabetes mellitus with diabetic chronic kidney disease: Secondary | ICD-10-CM | POA: Diagnosis not present

## 2017-11-29 ENCOUNTER — Other Ambulatory Visit: Payer: Self-pay | Admitting: Internal Medicine

## 2017-11-29 DIAGNOSIS — E876 Hypokalemia: Secondary | ICD-10-CM | POA: Diagnosis not present

## 2017-11-29 DIAGNOSIS — N186 End stage renal disease: Secondary | ICD-10-CM | POA: Diagnosis not present

## 2017-11-29 DIAGNOSIS — Z961 Presence of intraocular lens: Secondary | ICD-10-CM | POA: Diagnosis not present

## 2017-11-29 DIAGNOSIS — E113511 Type 2 diabetes mellitus with proliferative diabetic retinopathy with macular edema, right eye: Secondary | ICD-10-CM | POA: Diagnosis not present

## 2017-11-29 DIAGNOSIS — E1122 Type 2 diabetes mellitus with diabetic chronic kidney disease: Secondary | ICD-10-CM | POA: Diagnosis not present

## 2017-11-29 DIAGNOSIS — D631 Anemia in chronic kidney disease: Secondary | ICD-10-CM | POA: Diagnosis not present

## 2017-11-29 DIAGNOSIS — Z992 Dependence on renal dialysis: Secondary | ICD-10-CM | POA: Diagnosis not present

## 2017-11-29 DIAGNOSIS — H1851 Endothelial corneal dystrophy: Secondary | ICD-10-CM | POA: Diagnosis not present

## 2017-11-29 DIAGNOSIS — H3581 Retinal edema: Secondary | ICD-10-CM | POA: Diagnosis not present

## 2017-11-29 DIAGNOSIS — N2581 Secondary hyperparathyroidism of renal origin: Secondary | ICD-10-CM | POA: Diagnosis not present

## 2017-11-29 DIAGNOSIS — E1129 Type 2 diabetes mellitus with other diabetic kidney complication: Secondary | ICD-10-CM | POA: Diagnosis not present

## 2017-11-29 DIAGNOSIS — E113552 Type 2 diabetes mellitus with stable proliferative diabetic retinopathy, left eye: Secondary | ICD-10-CM | POA: Diagnosis not present

## 2017-11-30 ENCOUNTER — Encounter (INDEPENDENT_AMBULATORY_CARE_PROVIDER_SITE_OTHER): Payer: Self-pay | Admitting: Ophthalmology

## 2017-11-30 ENCOUNTER — Ambulatory Visit (INDEPENDENT_AMBULATORY_CARE_PROVIDER_SITE_OTHER): Payer: Medicare Other | Admitting: Ophthalmology

## 2017-11-30 DIAGNOSIS — H3581 Retinal edema: Secondary | ICD-10-CM

## 2017-11-30 DIAGNOSIS — Z961 Presence of intraocular lens: Secondary | ICD-10-CM

## 2017-11-30 DIAGNOSIS — H18519 Endothelial corneal dystrophy, unspecified eye: Secondary | ICD-10-CM

## 2017-11-30 DIAGNOSIS — H1851 Endothelial corneal dystrophy: Secondary | ICD-10-CM

## 2017-11-30 DIAGNOSIS — E113511 Type 2 diabetes mellitus with proliferative diabetic retinopathy with macular edema, right eye: Secondary | ICD-10-CM | POA: Diagnosis not present

## 2017-11-30 DIAGNOSIS — E113552 Type 2 diabetes mellitus with stable proliferative diabetic retinopathy, left eye: Secondary | ICD-10-CM | POA: Diagnosis not present

## 2017-12-01 ENCOUNTER — Encounter (INDEPENDENT_AMBULATORY_CARE_PROVIDER_SITE_OTHER): Payer: Self-pay | Admitting: Ophthalmology

## 2017-12-01 DIAGNOSIS — H3581 Retinal edema: Secondary | ICD-10-CM | POA: Diagnosis not present

## 2017-12-01 DIAGNOSIS — E876 Hypokalemia: Secondary | ICD-10-CM | POA: Diagnosis not present

## 2017-12-01 DIAGNOSIS — H1851 Endothelial corneal dystrophy: Secondary | ICD-10-CM | POA: Diagnosis not present

## 2017-12-01 DIAGNOSIS — N186 End stage renal disease: Secondary | ICD-10-CM | POA: Diagnosis not present

## 2017-12-01 DIAGNOSIS — E113552 Type 2 diabetes mellitus with stable proliferative diabetic retinopathy, left eye: Secondary | ICD-10-CM | POA: Diagnosis not present

## 2017-12-01 DIAGNOSIS — E113511 Type 2 diabetes mellitus with proliferative diabetic retinopathy with macular edema, right eye: Secondary | ICD-10-CM | POA: Diagnosis not present

## 2017-12-01 DIAGNOSIS — E1122 Type 2 diabetes mellitus with diabetic chronic kidney disease: Secondary | ICD-10-CM | POA: Diagnosis not present

## 2017-12-01 DIAGNOSIS — N2581 Secondary hyperparathyroidism of renal origin: Secondary | ICD-10-CM | POA: Diagnosis not present

## 2017-12-01 DIAGNOSIS — D631 Anemia in chronic kidney disease: Secondary | ICD-10-CM | POA: Diagnosis not present

## 2017-12-01 DIAGNOSIS — Z961 Presence of intraocular lens: Secondary | ICD-10-CM | POA: Diagnosis not present

## 2017-12-01 MED ORDER — BEVACIZUMAB CHEMO INJECTION 1.25MG/0.05ML SYRINGE FOR KALEIDOSCOPE
1.2500 mg | INTRAVITREAL | Status: DC
  Administered 2017-12-01: 1.25 mg via INTRAVITREAL

## 2017-12-03 DIAGNOSIS — N186 End stage renal disease: Secondary | ICD-10-CM | POA: Diagnosis not present

## 2017-12-03 DIAGNOSIS — N2581 Secondary hyperparathyroidism of renal origin: Secondary | ICD-10-CM | POA: Diagnosis not present

## 2017-12-03 DIAGNOSIS — E876 Hypokalemia: Secondary | ICD-10-CM | POA: Diagnosis not present

## 2017-12-03 DIAGNOSIS — D631 Anemia in chronic kidney disease: Secondary | ICD-10-CM | POA: Diagnosis not present

## 2017-12-03 DIAGNOSIS — E1122 Type 2 diabetes mellitus with diabetic chronic kidney disease: Secondary | ICD-10-CM | POA: Diagnosis not present

## 2017-12-06 DIAGNOSIS — E876 Hypokalemia: Secondary | ICD-10-CM | POA: Diagnosis not present

## 2017-12-06 DIAGNOSIS — D631 Anemia in chronic kidney disease: Secondary | ICD-10-CM | POA: Diagnosis not present

## 2017-12-06 DIAGNOSIS — N2581 Secondary hyperparathyroidism of renal origin: Secondary | ICD-10-CM | POA: Diagnosis not present

## 2017-12-06 DIAGNOSIS — N186 End stage renal disease: Secondary | ICD-10-CM | POA: Diagnosis not present

## 2017-12-06 DIAGNOSIS — E1122 Type 2 diabetes mellitus with diabetic chronic kidney disease: Secondary | ICD-10-CM | POA: Diagnosis not present

## 2017-12-08 DIAGNOSIS — D631 Anemia in chronic kidney disease: Secondary | ICD-10-CM | POA: Diagnosis not present

## 2017-12-08 DIAGNOSIS — E1122 Type 2 diabetes mellitus with diabetic chronic kidney disease: Secondary | ICD-10-CM | POA: Diagnosis not present

## 2017-12-08 DIAGNOSIS — E876 Hypokalemia: Secondary | ICD-10-CM | POA: Diagnosis not present

## 2017-12-08 DIAGNOSIS — N186 End stage renal disease: Secondary | ICD-10-CM | POA: Diagnosis not present

## 2017-12-08 DIAGNOSIS — N2581 Secondary hyperparathyroidism of renal origin: Secondary | ICD-10-CM | POA: Diagnosis not present

## 2017-12-10 DIAGNOSIS — D631 Anemia in chronic kidney disease: Secondary | ICD-10-CM | POA: Diagnosis not present

## 2017-12-10 DIAGNOSIS — E876 Hypokalemia: Secondary | ICD-10-CM | POA: Diagnosis not present

## 2017-12-10 DIAGNOSIS — N2581 Secondary hyperparathyroidism of renal origin: Secondary | ICD-10-CM | POA: Diagnosis not present

## 2017-12-10 DIAGNOSIS — E1122 Type 2 diabetes mellitus with diabetic chronic kidney disease: Secondary | ICD-10-CM | POA: Diagnosis not present

## 2017-12-10 DIAGNOSIS — N186 End stage renal disease: Secondary | ICD-10-CM | POA: Diagnosis not present

## 2017-12-13 DIAGNOSIS — E1122 Type 2 diabetes mellitus with diabetic chronic kidney disease: Secondary | ICD-10-CM | POA: Diagnosis not present

## 2017-12-13 DIAGNOSIS — D631 Anemia in chronic kidney disease: Secondary | ICD-10-CM | POA: Diagnosis not present

## 2017-12-13 DIAGNOSIS — N186 End stage renal disease: Secondary | ICD-10-CM | POA: Diagnosis not present

## 2017-12-13 DIAGNOSIS — N2581 Secondary hyperparathyroidism of renal origin: Secondary | ICD-10-CM | POA: Diagnosis not present

## 2017-12-13 DIAGNOSIS — E876 Hypokalemia: Secondary | ICD-10-CM | POA: Diagnosis not present

## 2017-12-15 DIAGNOSIS — N186 End stage renal disease: Secondary | ICD-10-CM | POA: Diagnosis not present

## 2017-12-15 DIAGNOSIS — D631 Anemia in chronic kidney disease: Secondary | ICD-10-CM | POA: Diagnosis not present

## 2017-12-15 DIAGNOSIS — N2581 Secondary hyperparathyroidism of renal origin: Secondary | ICD-10-CM | POA: Diagnosis not present

## 2017-12-15 DIAGNOSIS — E876 Hypokalemia: Secondary | ICD-10-CM | POA: Diagnosis not present

## 2017-12-15 DIAGNOSIS — E1122 Type 2 diabetes mellitus with diabetic chronic kidney disease: Secondary | ICD-10-CM | POA: Diagnosis not present

## 2017-12-17 DIAGNOSIS — D631 Anemia in chronic kidney disease: Secondary | ICD-10-CM | POA: Diagnosis not present

## 2017-12-17 DIAGNOSIS — N2581 Secondary hyperparathyroidism of renal origin: Secondary | ICD-10-CM | POA: Diagnosis not present

## 2017-12-17 DIAGNOSIS — E876 Hypokalemia: Secondary | ICD-10-CM | POA: Diagnosis not present

## 2017-12-17 DIAGNOSIS — N186 End stage renal disease: Secondary | ICD-10-CM | POA: Diagnosis not present

## 2017-12-17 DIAGNOSIS — E1122 Type 2 diabetes mellitus with diabetic chronic kidney disease: Secondary | ICD-10-CM | POA: Diagnosis not present

## 2017-12-20 DIAGNOSIS — E876 Hypokalemia: Secondary | ICD-10-CM | POA: Diagnosis not present

## 2017-12-20 DIAGNOSIS — N2581 Secondary hyperparathyroidism of renal origin: Secondary | ICD-10-CM | POA: Diagnosis not present

## 2017-12-20 DIAGNOSIS — E1122 Type 2 diabetes mellitus with diabetic chronic kidney disease: Secondary | ICD-10-CM | POA: Diagnosis not present

## 2017-12-20 DIAGNOSIS — D631 Anemia in chronic kidney disease: Secondary | ICD-10-CM | POA: Diagnosis not present

## 2017-12-20 DIAGNOSIS — N186 End stage renal disease: Secondary | ICD-10-CM | POA: Diagnosis not present

## 2017-12-22 DIAGNOSIS — D631 Anemia in chronic kidney disease: Secondary | ICD-10-CM | POA: Diagnosis not present

## 2017-12-22 DIAGNOSIS — N2581 Secondary hyperparathyroidism of renal origin: Secondary | ICD-10-CM | POA: Diagnosis not present

## 2017-12-22 DIAGNOSIS — E1122 Type 2 diabetes mellitus with diabetic chronic kidney disease: Secondary | ICD-10-CM | POA: Diagnosis not present

## 2017-12-22 DIAGNOSIS — N186 End stage renal disease: Secondary | ICD-10-CM | POA: Diagnosis not present

## 2017-12-22 DIAGNOSIS — E876 Hypokalemia: Secondary | ICD-10-CM | POA: Diagnosis not present

## 2017-12-24 DIAGNOSIS — D631 Anemia in chronic kidney disease: Secondary | ICD-10-CM | POA: Diagnosis not present

## 2017-12-24 DIAGNOSIS — E876 Hypokalemia: Secondary | ICD-10-CM | POA: Diagnosis not present

## 2017-12-24 DIAGNOSIS — E1122 Type 2 diabetes mellitus with diabetic chronic kidney disease: Secondary | ICD-10-CM | POA: Diagnosis not present

## 2017-12-24 DIAGNOSIS — N2581 Secondary hyperparathyroidism of renal origin: Secondary | ICD-10-CM | POA: Diagnosis not present

## 2017-12-24 DIAGNOSIS — N186 End stage renal disease: Secondary | ICD-10-CM | POA: Diagnosis not present

## 2017-12-27 DIAGNOSIS — E876 Hypokalemia: Secondary | ICD-10-CM | POA: Diagnosis not present

## 2017-12-27 DIAGNOSIS — N186 End stage renal disease: Secondary | ICD-10-CM | POA: Diagnosis not present

## 2017-12-27 DIAGNOSIS — E1122 Type 2 diabetes mellitus with diabetic chronic kidney disease: Secondary | ICD-10-CM | POA: Diagnosis not present

## 2017-12-27 DIAGNOSIS — N2581 Secondary hyperparathyroidism of renal origin: Secondary | ICD-10-CM | POA: Diagnosis not present

## 2017-12-27 DIAGNOSIS — D631 Anemia in chronic kidney disease: Secondary | ICD-10-CM | POA: Diagnosis not present

## 2017-12-29 DIAGNOSIS — N2581 Secondary hyperparathyroidism of renal origin: Secondary | ICD-10-CM | POA: Diagnosis not present

## 2017-12-29 DIAGNOSIS — D631 Anemia in chronic kidney disease: Secondary | ICD-10-CM | POA: Diagnosis not present

## 2017-12-29 DIAGNOSIS — N186 End stage renal disease: Secondary | ICD-10-CM | POA: Diagnosis not present

## 2017-12-29 DIAGNOSIS — E1129 Type 2 diabetes mellitus with other diabetic kidney complication: Secondary | ICD-10-CM | POA: Diagnosis not present

## 2017-12-29 DIAGNOSIS — E1122 Type 2 diabetes mellitus with diabetic chronic kidney disease: Secondary | ICD-10-CM | POA: Diagnosis not present

## 2017-12-29 DIAGNOSIS — Z992 Dependence on renal dialysis: Secondary | ICD-10-CM | POA: Diagnosis not present

## 2017-12-29 DIAGNOSIS — E876 Hypokalemia: Secondary | ICD-10-CM | POA: Diagnosis not present

## 2017-12-30 ENCOUNTER — Encounter (INDEPENDENT_AMBULATORY_CARE_PROVIDER_SITE_OTHER): Payer: Self-pay | Admitting: Ophthalmology

## 2017-12-30 ENCOUNTER — Ambulatory Visit (INDEPENDENT_AMBULATORY_CARE_PROVIDER_SITE_OTHER): Payer: Medicare Other | Admitting: Ophthalmology

## 2017-12-30 DIAGNOSIS — E113511 Type 2 diabetes mellitus with proliferative diabetic retinopathy with macular edema, right eye: Secondary | ICD-10-CM

## 2017-12-30 DIAGNOSIS — Z961 Presence of intraocular lens: Secondary | ICD-10-CM

## 2017-12-30 DIAGNOSIS — H3581 Retinal edema: Secondary | ICD-10-CM

## 2017-12-30 DIAGNOSIS — H18519 Endothelial corneal dystrophy, unspecified eye: Secondary | ICD-10-CM

## 2017-12-30 DIAGNOSIS — H1851 Endothelial corneal dystrophy: Secondary | ICD-10-CM

## 2017-12-30 DIAGNOSIS — E113552 Type 2 diabetes mellitus with stable proliferative diabetic retinopathy, left eye: Secondary | ICD-10-CM

## 2017-12-30 MED ORDER — BEVACIZUMAB CHEMO INJECTION 1.25MG/0.05ML SYRINGE FOR KALEIDOSCOPE
1.2500 mg | INTRAVITREAL | Status: DC
  Administered 2017-12-30: 1.25 mg via INTRAVITREAL

## 2017-12-30 NOTE — Progress Notes (Signed)
[facility] Note  12/30/2017     CHIEF COMPLAINT Patient presents for Retina Follow Up   HISTORY OF PRESENT ILLNESS: Carolyn Valenzuela is a 67 y.o. female who presents to the clinic today for:   HPI    Retina Follow Up    Patient presents with  Diabetic Retinopathy.  In both eyes.  Severity is mild.  Since onset it is gradually improving.  I, the attending physician,  performed the HPI with the patient and updated documentation appropriately.          Comments    F/U PDR OU. Patient states" I don't like that needle being stuck in my eye, I really don't want to have it done but, I will if doctor wants me to"the patient states her vision is "getting better", occasional floaters os. Bs 180's (12/29/17)" I see better when my sugar is up not down".Pt is using Restasis BID, Pazeo Qd, taking Multivitamins Qd.        Last edited by Bernarda Caffey, MD on 12/30/2017  9:40 AM. (History)      Referring physician: Biagio Borg, MD [place], [place] [address]  HISTORICAL INFORMATION:   Selected notes from the MEDICAL RECORD NUMBER Referred by Dr. Shirley Muscat for concern of DME OD and hard exudates OS;  LEE- 02.21.19 (S. Bernstorf) [BCVA OD: 20/40 OS: 20/40] Ocular Hx- hx of TRD OD, S/P vitrectomy OD (03/32/10 - [facility]), CR scar OU, DME OU, PDR OU, VH OU, pseudophakia OU (04-12/2006), S/P focal laser OU (06.01.2008), S/P PRP laser (12.08.2009 - [facility], undoctumented with eye), S/p PPV OS (2014 - [facility]);  PMH- DM, HTN, elevated cholesterol, kidney disorder    CURRENT MEDICATIONS: Current Outpatient Medications (Ophthalmic Drugs)  Medication Sig  . PAZEO 0.7 % SOLN INSTILL 1 DROP INTO BOTH EYES EVERY DAY IN THE MORNING  . RESTASIS 0.05 % ophthalmic emulsion INSTILL 1 DROP INTO BOTH EYES EVERY 12 HOURS   No current facility-administered medications for this visit.  (Ophthalmic Drugs)   Current Outpatient Medications (Other)  Medication Sig  .  aspirin 81 MG EC tablet Take 81 mg by mouth daily.    . clopidogrel (PLAVIX) 75 MG tablet TAKE 1 TABLET BY MOUTH EVERY DAY  . famotidine (PEPCID) 20 MG tablet Take 1 tablet (20 mg total) by mouth daily.  [name] gabapentin (NEURONTIN) 100 MG capsule TAKE 2 CAPSULES (200 MG TOTAL) BY MOUTH 3 (THREE) TIMES DAILY.  [name] glipiZIDE (GLUCOTROL XL) 10 MG 24 hr tablet TAKE 1 TABLET BY MOUTH DAILY WITH BREAKFAST.  [name] glucose blood (ONE TOUCH ULTRA TEST) test strip Use as instructed, twice daily code E11.52  . lidocaine-prilocaine (EMLA) cream APPLY A SMALL AMOUNT TO SKIN 3 TIMES A WEEK OVER DIALYSIS SHUNT 30 MINUTES PRIOR TO DIALYSIS  . lidocaine-prilocaine (EMLA) cream APPLY A SMALL AMOUNT TO SKIN 3 TIMES A WEEK OVER DIALYSIS SHUNT 30 MINUTES PRIOR TO DIALYSIS  . losartan (COZAAR) 100 MG tablet Take 1 tablet (100 mg total) by mouth daily.  . multivitamin (RENA-VIT) TABS tablet TAKE 1 TABLET BY MOUTH EVERY DAY AT BEDTIME  . ONETOUCH VERIO test strip USE TO CHECK BLOOD SUGARS TWICE A DAY DX E11.9  . pantoprazole (PROTONIX) 40 MG tablet TAKE 1 TABLET BY MOUTH TWICE A DAY BEFORE MEALS  . saccharomyces boulardii (FLORASTOR) 250 MG capsule Take 1 capsule (250 mg total) by mouth daily.  . sevelamer carbonate (RENVELA) 800 MG tablet Take 1,600 mg  by mouth 3 (three) times daily with meals.  . sitaGLIPtin (JANUVIA) 50 MG tablet Take 1 tablet (50 mg total) by mouth daily.   Current Facility-Administered Medications (Other)  Medication Route  . Bevacizumab (AVASTIN) SOLN 1.25 mg Intravitreal  . Bevacizumab (AVASTIN) SOLN 1.25 mg Intravitreal  . Bevacizumab (AVASTIN) SOLN 1.25 mg Intravitreal      REVIEW OF SYSTEMS: ROS    Positive for: Endocrine, Eyes   Negative for: Constitutional, Gastrointestinal, Neurological, Skin, Genitourinary, Musculoskeletal, HENT, Cardiovascular, Respiratory, Psychiatric, Allergic/Imm, Heme/Lymph   Last edited by Zenovia Jordan, LPN on 09/05/1094  0:45 AM. (History)        ALLERGIES Allergies  Allergen Reactions  . Oxycodone Itching    PAST MEDICAL HISTORY Past Medical History:  Diagnosis Date  . Allergic rhinitis, cause unspecified 12/08/2010  . ANEMIA-IRON DEFICIENCY 01/25/2008  . Aortic regurgitation   . CAD (coronary artery disease)    a. 07/2016 NSTEMI/PCI: LM nl, LAD 70ost, 56m/d, 80d, D1 40ost, LCX 99ost/p (2.5x16 Synergy DES - 2.75), 40/65m, Om2 100 CTO, RCA  30p/m. Pt eval by CT surgery, not felt to be suitable candidate 2/2 comorbidities.  . CERVICAL RADICULOPATHY, LEFT 01/25/2008  . Chronic systolic CHF (congestive heart failure) ([place])    a. 07/2016 Echo: EF 40-45%, diff HK, sev AI, mild MVP w/ mod to sev MR, PASP 84mmHg.  . Critical lower limb ischemia   . DIABETES MELLITUS, UNCONTROLLED 05/21/2009  . Esophagitis   . ESRD on dialysis ([place])   . Foot ulcer ([place])    right lateral malleolus  . HCAP (healthcare-associated pneumonia) 06/29/2016  . HYPERLIPIDEMIA 03/30/2007  . HYPERTENSION 01/25/2008  . Ischemic cardiomyopathy    a. 07/2016 Echo: EF 40-45%.  . Mitral regurgitation    a. 07/2016 Echo: Mild MVP w/ mod to sev MR.  [name] PVD (peripheral vascular disease) ([place])    a. s/p bilat BKA  . SECONDARY HYPERPARATHYROIDISM 05/21/2009  . Severe aortic regurgitation    a. 07/2016 Echo: Severe AI.   Past Surgical History:  Procedure Laterality Date  . AMPUTATION Right 03/15/2014   Procedure: RIGHT  LEG  BELOW KNEE AMPUTATION ;  Surgeon: [name], MD;  Location: [facility];  Service: Orthopedics;  Laterality: Right;  . AMPUTATION Left 11/29/2015   Procedure: AMPUTATION BELOW KNEE;  Surgeon: Newt Minion, MD;  Location: [facility];  Service: Orthopedics;  Laterality: Left;  . AV FISTULA PLACEMENT Left   . AV FISTULA PLACEMENT Left 09/06/2014   Procedure: ARTERIOVENOUS (AV) FISTULA CREATION-Left Brachiocephalic;  Surgeon: Conrad Allegany, MD;  Location: [facility];  Service: Vascular;  Laterality: Left;  . CARDIAC CATHETERIZATION N/A 06/30/2016   Procedure:  Left Heart Cath and Coronary Angiography;  Surgeon: Burnell Blanks, MD;  Location: [facility];  Service: Cardiovascular;  Laterality: N/A;  . CARDIAC CATHETERIZATION N/A 07/02/2016   Procedure: Coronary Stent Intervention;  Surgeon: Nelva Bush, MD;  Location: [facility];  Service: Cardiovascular;  Laterality: N/A;  . CATARACT EXTRACTION    . COLONOSCOPY    . CORONARY ANGIOPLASTY WITH STENT PLACEMENT  07/02/2016  . ESOPHAGOGASTRODUODENOSCOPY N/A 03/20/2016   Procedure: ESOPHAGOGASTRODUODENOSCOPY (EGD);  Surgeon: Milus Banister, MD;  Location: [facility];  Service: Endoscopy;  Laterality: N/A;  . EYE SURGERY Bilateral    cataracts removed, left eye still has some oil in it.  . INSERTION OF DIALYSIS CATHETER N/A 09/03/2014   Procedure: INSERTION OF DIALYSIS CATHETER RIGHT INTERNAL JUGULAR VEIN;  Surgeon: Conrad [place], MD;  [facility];  Service: Vascular;  Laterality: N/A;  . PERIPHERAL VASCULAR CATHETERIZATION N/A 08/13/2015   Procedure: Abdominal Aortogram;  Surgeon: Elam Dutch, MD;  Location: [facility];  Service: Cardiovascular;  Laterality: N/A;    FAMILY HISTORY Family History  Problem Relation Age of Onset  . Cancer Mother        Pancreatic    SOCIAL HISTORY Social History   Tobacco Use  . Smoking status: Never Smoker  . Smokeless tobacco: Never Used  Substance Use Topics  . Alcohol use: No    Alcohol/week: 0.0 oz  . Drug use: No         OPHTHALMIC EXAM:  Base Eye Exam    Visual Acuity (Snellen - Linear)      Right Left   Dist cc 20/40 +1 20/30   Dist ph cc NI NI   Correction:  Glasses       Tonometry (Tonopen, 9:26 AM)      Right Left   Pressure 15 16       Pupils      Dark Light Shape React APD   Right 2 1.5 Round Minimal None   Left 2 1.5 Round Minimal None       Visual Fields      Left Right    Full Full       Extraocular Movement      Right Left    Full, Ortho Full, Ortho       Neuro/Psych     Oriented x3:  Yes   Mood/Affect:  Normal       Dilation    Both eyes:  1.0% Mydriacyl, 2.5% Phenylephrine @ 9:26 AM        Slit Lamp and Fundus Exam    Slit Lamp Exam      Right Left   Lids/Lashes dermatochalasis dermatochalasis   Conjunctiva/Sclera White and quiet White and quiet   Cornea arcus; 1+ PEE; 2+ guttae arcus; 1-2+ guttae   Anterior Chamber Deep and quiet Deep and quiet   Iris Round, dilated; no NVI Round, dilated; large PI at 0600 no NVI   Lens PCIOL in good position PCIOL in good position   Vitreous clear clear       Fundus Exam      Right Left   Disc Mild Pallor Mild Pallor   C/D Ratio 0.3 0.35   Macula Scattered MAs, dot hemorrhages, exudates, blunted foveal reflex with positive edema Blunted foveal reflex, scattered Exudates, Retinal pigment epithelial mottling, focal laser scars   Vessels Vascular attenuation, sclerotic arterioles sclerotic arterioles, generalized attenuation   Periphery 360 PRP, scattered areas of fibrosis 360 PRP in place, areas of fibrosis          IMAGING AND PROCEDURES  Imaging and Procedures for 12/30/17  OCT, Retina - OU - Both Eyes       Right Eye Quality was borderline. Central Foveal Thickness: 284. Progression has improved. Findings include abnormal foveal contour, no SRF, intraretinal fluid, epiretinal membrane, intraretinal hyper-reflective material, outer retinal atrophy, inner retinal atrophy.   Left Eye Quality was good. Central Foveal Thickness: 283. Progression has been stable. Findings include abnormal foveal contour, no IRF, no SRF, macular pucker, epiretinal membrane, outer retinal atrophy (Rare cystic change temporally).   Notes *Images captured and stored on drive  Diagnosis / Impression:  OD: DME; ERM - mild interval improvement in IRF OS: no DME; +ERM w/ some pucker and preretinal fibrosis   Clinical management:  See below  Abbreviations: NFP - Normal foveal profile. CME - cystoid macular edema. PED -  pigment epithelial detachment. IRF - intraretinal fluid. SRF - subretinal fluid. EZ - ellipsoid zone. ERM - epiretinal membrane. ORA - outer retinal atrophy. ORT - outer retinal tubulation. SRHM - subretinal hyper-reflective material         Intravitreal Injection, Pharmacologic Agent - OD - Right Eye       Time Out 12/30/2017. 9:52 AM. Confirmed correct patient, procedure, site, and patient consented.   Anesthesia Topical anesthesia was used. Anesthetic medications included Lidocaine 2%, Tetracaine 0.5%.   Procedure Preparation included 5% betadine to ocular surface. A supplied needle was used.   Injection: 1.25 mg Bevacizumab 1.25mg /0.59ml   NDC: 41962-229-79    Lot: [id number]     Expiration Date: 01/13/2018   Route: Intravitreal   Site: Right Eye   Waste: 0 mg  Post-op Post injection exam found visual acuity of at least counting fingers. The patient tolerated the procedure well. There were no complications. The patient received written and verbal post procedure care education.                 ASSESSMENT/PLAN:    ICD-10-CM   1. Proliferative diabetic retinopathy of right eye with macular edema associated with type 2 diabetes mellitus (HCC) E11.3511 OCT, Retina - OU - Both Eyes    Intravitreal Injection, Pharmacologic Agent - OD - Right Eye    Bevacizumab (AVASTIN) SOLN 1.25 mg  2. Stable proliferative diabetic retinopathy of left eye associated with type 2 diabetes mellitus ([place]) [address]   3. Retinal edema H35.81   4. Pseudophakia of both eyes Z96.1   5. Corneal guttata H18.51     1,2,3. Proliferative diabetic retinopathy OU- - s/p PPV OU with [facility] in 2014 - s/p PRP OU with [facility] - S/P IVA OD #1 (02.26.19), #2 (04.02.19) - exam shows good PRP 360 OU; sclerotic arterioles with sheathing OU; scattered DBH OU - OCT shows persistent diabetic macular edema, OD - recommend IVA #3 OD today, (05.02.19) - pt wishes to proceed - RBA of procedure discussed,  questions answered - informed consent obtained and signed - see procedure note - f/u in 4 wks  4. Pseudophakia OU  - s/p CE/IOL OU by Dr. 18.02.19  - beautiful surgery, doing well  - monitor  5. Corneal guttata OU - no edema - monitor   Ophthalmic Meds Ordered this visit:  Meds ordered this encounter  Medications  . Bevacizumab (AVASTIN) SOLN 1.25 mg       Return in about 1 month (around 01/27/2018) for F/U PDR OU, DFE, OCT.  There are no Patient Instructions on file for this visit.   Explained the diagnoses, plan, and follow up with the patient and they expressed understanding.  Patient expressed understanding of the importance of proper follow up care.   This document serves as a record of services personally performed by 02/09/2018, MD, PhD. It was created on their behalf by Gardiner Sleeper, [place], a certified ophthalmic assistant. The creation of this record is the provider's dictation and/or activities during the visit.  Electronically signed by: [address], [place]  12/30/17 10:24 AM   18/02/19, M.D., Ph.D. Diseases & Surgery of the Retina and Vitreous [facility] 12/30/17  I have reviewed the above documentation for accuracy and completeness, and I agree with the above. 18/02/19, M.D., Ph.D. 12/30/17 10:24 AM     Abbreviations: 18/02/19  myopia (nearsighted); A astigmatism; H hyperopia (farsighted); P presbyopia; Mrx spectacle prescription;  CTL contact lenses; OD right eye; OS left eye; OU both eyes  XT exotropia; ET esotropia; PEK punctate epithelial keratitis; PEE punctate epithelial erosions; DES dry eye syndrome; MGD meibomian gland dysfunction; ATs artificial tears; PFAT's preservative free artificial tears; [place] nuclear sclerotic cataract; PSC posterior subcapsular cataract; ERM epi-retinal membrane; PVD posterior vitreous detachment; RD retinal detachment; DM diabetes mellitus; DR diabetic retinopathy; NPDR non-proliferative  diabetic retinopathy; PDR proliferative diabetic retinopathy; CSME clinically significant macular edema; DME diabetic macular edema; dbh dot blot hemorrhages; CWS cotton wool spot; POAG primary open angle glaucoma; C/D cup-to-disc ratio; HVF humphrey visual field; GVF goldmann visual field; OCT optical coherence tomography; IOP intraocular pressure; BRVO Branch retinal vein occlusion; CRVO central retinal vein occlusion; CRAO central retinal artery occlusion; BRAO branch retinal artery occlusion; RT retinal tear; SB scleral buckle; PPV pars plana vitrectomy; VH Vitreous hemorrhage; PRP panretinal laser photocoagulation; IVK intravitreal kenalog; VMT vitreomacular traction; MH Macular hole;  NVD neovascularization of the disc; NVE neovascularization elsewhere; AREDS age related eye disease study; ARMD age related macular degeneration; POAG primary open angle glaucoma; EBMD epithelial/anterior basement membrane dystrophy; ACIOL anterior chamber intraocular lens; IOL intraocular lens; PCIOL posterior chamber intraocular lens; Phaco/IOL phacoemulsification with intraocular lens placement; [place] photorefractive keratectomy; LASIK laser assisted in situ keratomileusis; HTN hypertension; DM diabetes mellitus; COPD chronic obstructive pulmonary disease

## 2017-12-31 DIAGNOSIS — N186 End stage renal disease: Secondary | ICD-10-CM | POA: Diagnosis not present

## 2017-12-31 DIAGNOSIS — E876 Hypokalemia: Secondary | ICD-10-CM | POA: Diagnosis not present

## 2017-12-31 DIAGNOSIS — D631 Anemia in chronic kidney disease: Secondary | ICD-10-CM | POA: Diagnosis not present

## 2017-12-31 DIAGNOSIS — N2581 Secondary hyperparathyroidism of renal origin: Secondary | ICD-10-CM | POA: Diagnosis not present

## 2017-12-31 DIAGNOSIS — E1122 Type 2 diabetes mellitus with diabetic chronic kidney disease: Secondary | ICD-10-CM | POA: Diagnosis not present

## 2018-01-03 DIAGNOSIS — D631 Anemia in chronic kidney disease: Secondary | ICD-10-CM | POA: Diagnosis not present

## 2018-01-03 DIAGNOSIS — E1122 Type 2 diabetes mellitus with diabetic chronic kidney disease: Secondary | ICD-10-CM | POA: Diagnosis not present

## 2018-01-03 DIAGNOSIS — N2581 Secondary hyperparathyroidism of renal origin: Secondary | ICD-10-CM | POA: Diagnosis not present

## 2018-01-03 DIAGNOSIS — N186 End stage renal disease: Secondary | ICD-10-CM | POA: Diagnosis not present

## 2018-01-03 DIAGNOSIS — E876 Hypokalemia: Secondary | ICD-10-CM | POA: Diagnosis not present

## 2018-01-05 DIAGNOSIS — E876 Hypokalemia: Secondary | ICD-10-CM | POA: Diagnosis not present

## 2018-01-05 DIAGNOSIS — N2581 Secondary hyperparathyroidism of renal origin: Secondary | ICD-10-CM | POA: Diagnosis not present

## 2018-01-05 DIAGNOSIS — E1122 Type 2 diabetes mellitus with diabetic chronic kidney disease: Secondary | ICD-10-CM | POA: Diagnosis not present

## 2018-01-05 DIAGNOSIS — D631 Anemia in chronic kidney disease: Secondary | ICD-10-CM | POA: Diagnosis not present

## 2018-01-05 DIAGNOSIS — N186 End stage renal disease: Secondary | ICD-10-CM | POA: Diagnosis not present

## 2018-01-07 DIAGNOSIS — N186 End stage renal disease: Secondary | ICD-10-CM | POA: Diagnosis not present

## 2018-01-07 DIAGNOSIS — D631 Anemia in chronic kidney disease: Secondary | ICD-10-CM | POA: Diagnosis not present

## 2018-01-07 DIAGNOSIS — N2581 Secondary hyperparathyroidism of renal origin: Secondary | ICD-10-CM | POA: Diagnosis not present

## 2018-01-07 DIAGNOSIS — E1122 Type 2 diabetes mellitus with diabetic chronic kidney disease: Secondary | ICD-10-CM | POA: Diagnosis not present

## 2018-01-07 DIAGNOSIS — E876 Hypokalemia: Secondary | ICD-10-CM | POA: Diagnosis not present

## 2018-01-10 DIAGNOSIS — N2581 Secondary hyperparathyroidism of renal origin: Secondary | ICD-10-CM | POA: Diagnosis not present

## 2018-01-10 DIAGNOSIS — E1122 Type 2 diabetes mellitus with diabetic chronic kidney disease: Secondary | ICD-10-CM | POA: Diagnosis not present

## 2018-01-10 DIAGNOSIS — D631 Anemia in chronic kidney disease: Secondary | ICD-10-CM | POA: Diagnosis not present

## 2018-01-10 DIAGNOSIS — E876 Hypokalemia: Secondary | ICD-10-CM | POA: Diagnosis not present

## 2018-01-10 DIAGNOSIS — N186 End stage renal disease: Secondary | ICD-10-CM | POA: Diagnosis not present

## 2018-01-12 DIAGNOSIS — N2581 Secondary hyperparathyroidism of renal origin: Secondary | ICD-10-CM | POA: Diagnosis not present

## 2018-01-12 DIAGNOSIS — D631 Anemia in chronic kidney disease: Secondary | ICD-10-CM | POA: Diagnosis not present

## 2018-01-12 DIAGNOSIS — E1122 Type 2 diabetes mellitus with diabetic chronic kidney disease: Secondary | ICD-10-CM | POA: Diagnosis not present

## 2018-01-12 DIAGNOSIS — N186 End stage renal disease: Secondary | ICD-10-CM | POA: Diagnosis not present

## 2018-01-12 DIAGNOSIS — E876 Hypokalemia: Secondary | ICD-10-CM | POA: Diagnosis not present

## 2018-01-14 DIAGNOSIS — E876 Hypokalemia: Secondary | ICD-10-CM | POA: Diagnosis not present

## 2018-01-14 DIAGNOSIS — D631 Anemia in chronic kidney disease: Secondary | ICD-10-CM | POA: Diagnosis not present

## 2018-01-14 DIAGNOSIS — E1122 Type 2 diabetes mellitus with diabetic chronic kidney disease: Secondary | ICD-10-CM | POA: Diagnosis not present

## 2018-01-14 DIAGNOSIS — N2581 Secondary hyperparathyroidism of renal origin: Secondary | ICD-10-CM | POA: Diagnosis not present

## 2018-01-14 DIAGNOSIS — N186 End stage renal disease: Secondary | ICD-10-CM | POA: Diagnosis not present

## 2018-01-17 DIAGNOSIS — N186 End stage renal disease: Secondary | ICD-10-CM | POA: Diagnosis not present

## 2018-01-17 DIAGNOSIS — E1122 Type 2 diabetes mellitus with diabetic chronic kidney disease: Secondary | ICD-10-CM | POA: Diagnosis not present

## 2018-01-17 DIAGNOSIS — D631 Anemia in chronic kidney disease: Secondary | ICD-10-CM | POA: Diagnosis not present

## 2018-01-17 DIAGNOSIS — N2581 Secondary hyperparathyroidism of renal origin: Secondary | ICD-10-CM | POA: Diagnosis not present

## 2018-01-17 DIAGNOSIS — E876 Hypokalemia: Secondary | ICD-10-CM | POA: Diagnosis not present

## 2018-01-19 DIAGNOSIS — N2581 Secondary hyperparathyroidism of renal origin: Secondary | ICD-10-CM | POA: Diagnosis not present

## 2018-01-19 DIAGNOSIS — E876 Hypokalemia: Secondary | ICD-10-CM | POA: Diagnosis not present

## 2018-01-19 DIAGNOSIS — E1122 Type 2 diabetes mellitus with diabetic chronic kidney disease: Secondary | ICD-10-CM | POA: Diagnosis not present

## 2018-01-19 DIAGNOSIS — N186 End stage renal disease: Secondary | ICD-10-CM | POA: Diagnosis not present

## 2018-01-19 DIAGNOSIS — D631 Anemia in chronic kidney disease: Secondary | ICD-10-CM | POA: Diagnosis not present

## 2018-01-21 DIAGNOSIS — N2581 Secondary hyperparathyroidism of renal origin: Secondary | ICD-10-CM | POA: Diagnosis not present

## 2018-01-21 DIAGNOSIS — E1122 Type 2 diabetes mellitus with diabetic chronic kidney disease: Secondary | ICD-10-CM | POA: Diagnosis not present

## 2018-01-21 DIAGNOSIS — D631 Anemia in chronic kidney disease: Secondary | ICD-10-CM | POA: Diagnosis not present

## 2018-01-21 DIAGNOSIS — N186 End stage renal disease: Secondary | ICD-10-CM | POA: Diagnosis not present

## 2018-01-21 DIAGNOSIS — E876 Hypokalemia: Secondary | ICD-10-CM | POA: Diagnosis not present

## 2018-01-24 DIAGNOSIS — N2581 Secondary hyperparathyroidism of renal origin: Secondary | ICD-10-CM | POA: Diagnosis not present

## 2018-01-24 DIAGNOSIS — N186 End stage renal disease: Secondary | ICD-10-CM | POA: Diagnosis not present

## 2018-01-24 DIAGNOSIS — E1122 Type 2 diabetes mellitus with diabetic chronic kidney disease: Secondary | ICD-10-CM | POA: Diagnosis not present

## 2018-01-24 DIAGNOSIS — D631 Anemia in chronic kidney disease: Secondary | ICD-10-CM | POA: Diagnosis not present

## 2018-01-24 DIAGNOSIS — E876 Hypokalemia: Secondary | ICD-10-CM | POA: Diagnosis not present

## 2018-01-26 DIAGNOSIS — E1122 Type 2 diabetes mellitus with diabetic chronic kidney disease: Secondary | ICD-10-CM | POA: Diagnosis not present

## 2018-01-26 DIAGNOSIS — E876 Hypokalemia: Secondary | ICD-10-CM | POA: Diagnosis not present

## 2018-01-26 DIAGNOSIS — N2581 Secondary hyperparathyroidism of renal origin: Secondary | ICD-10-CM | POA: Diagnosis not present

## 2018-01-26 DIAGNOSIS — D631 Anemia in chronic kidney disease: Secondary | ICD-10-CM | POA: Diagnosis not present

## 2018-01-26 DIAGNOSIS — N186 End stage renal disease: Secondary | ICD-10-CM | POA: Diagnosis not present

## 2018-01-26 NOTE — Progress Notes (Signed)
[facility] Note  01/27/2018     CHIEF COMPLAINT Patient presents for Retina Follow Up   HISTORY OF PRESENT ILLNESS: Carolyn Valenzuela is a 67 y.o. female who presents to the clinic today for:   HPI    Retina Follow Up    In both eyes.  This started 4 weeks ago.  Severity is mild.  Since onset it is gradually improving.  I, the attending physician,  performed the HPI with the patient and updated documentation appropriately.          Comments    F/U PDR OU. Patient states her vision is gradually improving, floaters have decreased OS. Bs 122 this am, Bs remain WINL.  Pt is using Restasis OU Bid, Pazeo OU QD.       Last edited by Bernarda Caffey, MD on 01/27/2018 10:24 AM. (History)      Referring physician: Biagio Borg, MD [place], [place] [id number]  HISTORICAL INFORMATION:   Selected notes from the MEDICAL RECORD NUMBER Referred by Dr. Shirley Muscat for concern of DME OD and hard exudates OS;  LEE- 02.21.19 (S. Bernstorf) [BCVA OD: 20/40 OS: 20/40] Ocular Hx- hx of TRD OD, S/P vitrectomy OD (03/32/10 - [facility]), CR scar OU, DME OU, PDR OU, VH OU, pseudophakia OU (04-12/2006), S/P focal laser OU (06.01.2008), S/P PRP laser (12.08.2009 - [facility], undoctumented with eye), S/p PPV OS (2014 - [facility]);  PMH- DM, HTN, elevated cholesterol, kidney disorder    CURRENT MEDICATIONS: Current Outpatient Medications (Ophthalmic Drugs)  Medication Sig  . PAZEO 0.7 % SOLN INSTILL 1 DROP INTO BOTH EYES EVERY DAY IN THE MORNING  . RESTASIS 0.05 % ophthalmic emulsion INSTILL 1 DROP INTO BOTH EYES EVERY 12 HOURS   No current facility-administered medications for this visit.  (Ophthalmic Drugs)   Current Outpatient Medications (Other)  Medication Sig  . aspirin 81 MG EC tablet Take 81 mg by mouth daily.    . clopidogrel (PLAVIX) 75 MG tablet TAKE 1 TABLET BY MOUTH EVERY DAY  . famotidine (PEPCID) 20 MG tablet Take 1 tablet (20 mg total) by mouth  daily.  [name] gabapentin (NEURONTIN) 100 MG capsule TAKE 2 CAPSULES (200 MG TOTAL) BY MOUTH 3 (THREE) TIMES DAILY.  [name] glipiZIDE (GLUCOTROL XL) 10 MG 24 hr tablet TAKE 1 TABLET BY MOUTH DAILY WITH BREAKFAST.  [name] glucose blood (ONE TOUCH ULTRA TEST) test strip Use as instructed, twice daily code E11.52  . lidocaine-prilocaine (EMLA) cream APPLY A SMALL AMOUNT TO SKIN 3 TIMES A WEEK OVER DIALYSIS SHUNT 30 MINUTES PRIOR TO DIALYSIS  . lidocaine-prilocaine (EMLA) cream APPLY A SMALL AMOUNT TO SKIN 3 TIMES A WEEK OVER DIALYSIS SHUNT 30 MINUTES PRIOR TO DIALYSIS  . losartan (COZAAR) 100 MG tablet Take 1 tablet (100 mg total) by mouth daily.  . multivitamin (RENA-VIT) TABS tablet TAKE 1 TABLET BY MOUTH EVERY DAY AT BEDTIME  . ONETOUCH VERIO test strip USE TO CHECK BLOOD SUGARS TWICE A DAY DX E11.9  . pantoprazole (PROTONIX) 40 MG tablet TAKE 1 TABLET BY MOUTH TWICE A DAY BEFORE MEALS  . saccharomyces boulardii (FLORASTOR) 250 MG capsule Take 1 capsule (250 mg total) by mouth daily.  . sevelamer carbonate (RENVELA) 800 MG tablet Take 1,600 mg by mouth 3 (three) times daily with meals.  . sitaGLIPtin (JANUVIA) 50 MG tablet Take 1 tablet (50 mg total) by mouth daily.   Current Facility-Administered Medications (Other)  Medication Route  .  Bevacizumab (AVASTIN) SOLN 1.25 mg Intravitreal  . Bevacizumab (AVASTIN) SOLN 1.25 mg Intravitreal  . Bevacizumab (AVASTIN) SOLN 1.25 mg Intravitreal  . Bevacizumab (AVASTIN) SOLN 1.25 mg Intravitreal      REVIEW OF SYSTEMS: ROS    Positive for: Eyes   Negative for: Constitutional, Gastrointestinal, Neurological, Skin, Genitourinary, Musculoskeletal, HENT, Endocrine, Cardiovascular, Respiratory, Psychiatric, Allergic/Imm, Heme/Lymph   Last edited by Zenovia Jordan, LPN on 9/93/5701  7:79 AM. (History)       ALLERGIES Allergies  Allergen Reactions  . Oxycodone Itching    PAST MEDICAL HISTORY Past Medical History:  Diagnosis Date  . Allergic rhinitis,  cause unspecified 12/08/2010  . ANEMIA-IRON DEFICIENCY 01/25/2008  . Aortic regurgitation   . CAD (coronary artery disease)    a. 07/2016 NSTEMI/PCI: LM nl, LAD 70ost, 61m/d, 80d, D1 40ost, LCX 99ost/p (2.5x16 Synergy DES - 2.75), 40/59m, Om2 100 CTO, RCA  30p/m. Pt eval by CT surgery, not felt to be suitable candidate 2/2 comorbidities.  . CERVICAL RADICULOPATHY, LEFT 01/25/2008  . Chronic systolic CHF (congestive heart failure) ([place])    a. 07/2016 Echo: EF 40-45%, diff HK, sev AI, mild MVP w/ mod to sev MR, PASP 64mmHg.  . Critical lower limb ischemia   . DIABETES MELLITUS, UNCONTROLLED 05/21/2009  . Esophagitis   . ESRD on dialysis ([place])   . Foot ulcer ([place])    right lateral malleolus  . HCAP (healthcare-associated pneumonia) 06/29/2016  . HYPERLIPIDEMIA 03/30/2007  . HYPERTENSION 01/25/2008  . Ischemic cardiomyopathy    a. 07/2016 Echo: EF 40-45%.  . Mitral regurgitation    a. 07/2016 Echo: Mild MVP w/ mod to sev MR.  [name] PVD (peripheral vascular disease) ([place])    a. s/p bilat BKA  . SECONDARY HYPERPARATHYROIDISM 05/21/2009  . Severe aortic regurgitation    a. 07/2016 Echo: Severe AI.   Past Surgical History:  Procedure Laterality Date  . AMPUTATION Right 03/15/2014   Procedure: RIGHT  LEG  BELOW KNEE AMPUTATION ;  Surgeon: [name], MD;  Location: [facility];  Service: Orthopedics;  Laterality: Right;  . AMPUTATION Left 11/29/2015   Procedure: AMPUTATION BELOW KNEE;  Surgeon: Newt Minion, MD;  Location: [facility];  Service: Orthopedics;  Laterality: Left;  . AV FISTULA PLACEMENT Left   . AV FISTULA PLACEMENT Left 09/06/2014   Procedure: ARTERIOVENOUS (AV) FISTULA CREATION-Left Brachiocephalic;  Surgeon: Conrad Lucas, MD;  Location: [facility];  Service: Vascular;  Laterality: Left;  . CARDIAC CATHETERIZATION N/A 06/30/2016   Procedure: Left Heart Cath and Coronary Angiography;  Surgeon: Burnell Blanks, MD;  Location: [facility];  Service: Cardiovascular;  Laterality: N/A;  .  CARDIAC CATHETERIZATION N/A 07/02/2016   Procedure: Coronary Stent Intervention;  Surgeon: Nelva Bush, MD;  Location: [facility];  Service: Cardiovascular;  Laterality: N/A;  . CATARACT EXTRACTION    . COLONOSCOPY    . CORONARY ANGIOPLASTY WITH STENT PLACEMENT  07/02/2016  . ESOPHAGOGASTRODUODENOSCOPY N/A 03/20/2016   Procedure: ESOPHAGOGASTRODUODENOSCOPY (EGD);  Surgeon: Milus Banister, MD;  Location: [facility];  Service: Endoscopy;  Laterality: N/A;  . EYE SURGERY Bilateral    cataracts removed, left eye still has some oil in it.  . INSERTION OF DIALYSIS CATHETER N/A 09/03/2014   Procedure: INSERTION OF DIALYSIS CATHETER RIGHT INTERNAL JUGULAR VEIN;  Surgeon: Conrad Cape Meares, MD;  Location: [facility];  Service: Vascular;  Laterality: N/A;  . PERIPHERAL VASCULAR CATHETERIZATION N/A 08/13/2015   Procedure: Abdominal Aortogram;  Surgeon: Elam Dutch, MD;  Location: [facility];  Service: Cardiovascular;  Laterality: N/A;    FAMILY HISTORY Family History  Problem Relation Age of Onset  . Cancer Mother        Pancreatic    SOCIAL HISTORY Social History   Tobacco Use  . Smoking status: Never Smoker  . Smokeless tobacco: Never Used  Substance Use Topics  . Alcohol use: No    Alcohol/week: 0.0 oz  . Drug use: No         OPHTHALMIC EXAM:  Base Eye Exam    Visual Acuity (Snellen - Linear)      Right Left   Dist cc 20/40 +2 20/40 +3   Dist ph cc NI NI   Correction:  Glasses       Tonometry (Tonopen, 10:01 AM)      Right Left   Pressure 18 19       Pupils      Dark Light Shape React APD   Right 3 2 Round Minimal None   Left 3 2 Round Minimal None       Visual Fields (Counting fingers)      Left Right    Full Full       Extraocular Movement      Right Left    Full, Ortho Full, Ortho       Neuro/Psych    Oriented x3:  Yes   Mood/Affect:  Normal       Dilation    Both eyes:  1.0% Mydriacyl, Paremyd @ 10:01 AM        Slit Lamp and  Fundus Exam    Slit Lamp Exam      Right Left   Lids/Lashes dermatochalasis dermatochalasis   Conjunctiva/Sclera White and quiet White and quiet   Cornea arcus; 1+ PEE; 2+ guttae arcus; 1-2+ guttae   Anterior Chamber Deep and quiet Deep and quiet   Iris Round, dilated; no NVI Round, dilated; large PI at 0600 no NVI   Lens PCIOL in good position PCIOL in good position   Vitreous clear clear       Fundus Exam      Right Left   Disc Mild Pallor Mild Pallor   C/D Ratio 0.3 0.35   Macula Scattered MAs, dot hemorrhages, exudates, blunted foveal reflex with positive edema, Inferior Epiretinal membrane Blunted foveal reflex, scattered Exudates, Retinal pigment epithelial mottling, focal laser scars   Vessels Vascular attenuation, sclerotic arterioles sclerotic arterioles, generalized attenuation   Periphery 360 PRP, scattered areas of fibrosis 360 PRP in place, areas of fibrosis          IMAGING AND PROCEDURES  Imaging and Procedures for 12/30/17  OCT, Retina - OU - Both Eyes       Right Eye Quality was borderline. Central Foveal Thickness: 280. Progression has improved. Findings include abnormal foveal contour, no SRF, intraretinal fluid, epiretinal membrane, intraretinal hyper-reflective material, outer retinal atrophy, inner retinal atrophy (Very mild improvement in IRF).   Left Eye Quality was good. Central Foveal Thickness: 284. Progression has been stable. Findings include abnormal foveal contour, no IRF, no SRF, macular pucker, epiretinal membrane, outer retinal atrophy (Rare cystic change temporally).   Notes *Images captured and stored on drive  Diagnosis / Impression:  OD: DME; ERM - mild interval improvement in IRF OS: no DME; +ERM w/ some pucker and preretinal fibrosis   Clinical management:  See below  Abbreviations: NFP - Normal foveal profile. CME - cystoid macular edema. PED - pigment  epithelial detachment. IRF - intraretinal fluid. SRF - subretinal fluid. EZ -  ellipsoid zone. ERM - epiretinal membrane. ORA - outer retinal atrophy. ORT - outer retinal tubulation. SRHM - subretinal hyper-reflective material         Intravitreal Injection, Pharmacologic Agent - OD - Right Eye       Time Out 01/27/2018. 10:35 AM. Confirmed correct patient, procedure, site, and patient consented.   Anesthesia Topical anesthesia was used. Anesthetic medications included Lidocaine 2%, Proparacaine 0.5%.   Procedure Preparation included 5% betadine to ocular surface, eyelid speculum. A supplied needle was used.   Injection: 1.25 mg Bevacizumab 1.25mg /0.55ml   NDC: 97353-299-24    Lot: [id number]    Expiration Date: 03/23/2018   Route: Intravitreal   Site: Right Eye   Waste: 0 mg  Post-op Post injection exam found visual acuity of at least counting fingers. The patient tolerated the procedure well. There were no complications. The patient received written and verbal post procedure care education.                 ASSESSMENT/PLAN:    ICD-10-CM   1. Proliferative diabetic retinopathy of right eye with macular edema associated with type 2 diabetes mellitus (HCC) Q22.2979 Intravitreal Injection, Pharmacologic Agent - OD - Right Eye    Bevacizumab (AVASTIN) SOLN 1.25 mg  2. Stable proliferative diabetic retinopathy of left eye associated with type 2 diabetes mellitus ([place]) G92.1194   3. Retinal edema H35.81 OCT, Retina - OU - Both Eyes  4. Pseudophakia of both eyes Z96.1   5. Corneal guttata H18.51     1,2,3. Proliferative diabetic retinopathy OU- - s/p PPV OU with [facility] in 2014 - s/p PRP OU with [facility] - S/P IVA OD #1 (02.26.19), #2 (04.02.19), #3 (05.02.19) - exam shows good PRP 360 OU; sclerotic arterioles with sheathing OU; scattered DBH OU - BCVA stable at 20/40 - no significant change on IVA - OCT shows persistent diabetic macular edema, OD with minimal change thus far on IVA - discussed, if no significant improvement, will consider IVTA -  recommend IVA #4 OD today, (05.30.19) - pt wishes to proceed - RBA of procedure discussed, questions answered - informed consent obtained and signed - see procedure note - f/u in 4 wks  4. Pseudophakia OU  - s/p CE/IOL OU by Dr. Elliot Dally  - beautiful surgery, doing well  - monitor  5. Corneal guttata OU - no edema - monitor   Ophthalmic Meds Ordered this visit:  Meds ordered this encounter  Medications  . Bevacizumab (AVASTIN) SOLN 1.25 mg       Return in about 1 month (around 02/24/2018) for Dilated Exam, OCT.  There are no Patient Instructions on file for this visit.   Explained the diagnoses, plan, and follow up with the patient and they expressed understanding.  Patient expressed understanding of the importance of proper follow up care.   This document serves as a record of services personally performed by Gardiner Sleeper, MD, PhD. It was created on their behalf by Catha Brow, [place], a certified ophthalmic assistant. The creation of this record is the provider's dictation and/or activities during the visit.  Electronically signed by: Catha Brow, COA  05.29.19 9:33 AM    Gardiner Sleeper, M.D., Ph.D. Diseases & Surgery of the Retina and Vitreous [facility] 01/27/18  I have reviewed the above documentation for accuracy and completeness, and I agree with the above. Gardiner Sleeper, M.D., Ph.D.  01/28/18 9:33 AM    Abbreviations: M myopia (nearsighted); A astigmatism; H hyperopia (farsighted); P presbyopia; Mrx spectacle prescription;  CTL contact lenses; OD right eye; OS left eye; OU both eyes  XT exotropia; ET esotropia; PEK punctate epithelial keratitis; PEE punctate epithelial erosions; DES dry eye syndrome; MGD meibomian gland dysfunction; ATs artificial tears; PFAT's preservative free artificial tears; Bradley nuclear sclerotic cataract; PSC posterior subcapsular cataract; ERM epi-retinal membrane; PVD posterior vitreous detachment; RD  retinal detachment; DM diabetes mellitus; DR diabetic retinopathy; NPDR non-proliferative diabetic retinopathy; PDR proliferative diabetic retinopathy; CSME clinically significant macular edema; DME diabetic macular edema; dbh dot blot hemorrhages; CWS cotton wool spot; POAG primary open angle glaucoma; C/D cup-to-disc ratio; HVF humphrey visual field; GVF goldmann visual field; OCT optical coherence tomography; IOP intraocular pressure; BRVO Branch retinal vein occlusion; CRVO central retinal vein occlusion; CRAO central retinal artery occlusion; BRAO branch retinal artery occlusion; RT retinal tear; SB scleral buckle; PPV pars plana vitrectomy; VH Vitreous hemorrhage; PRP panretinal laser photocoagulation; IVK intravitreal kenalog; VMT vitreomacular traction; MH Macular hole;  NVD neovascularization of the disc; NVE neovascularization elsewhere; AREDS age related eye disease study; ARMD age related macular degeneration; POAG primary open angle glaucoma; EBMD epithelial/anterior basement membrane dystrophy; ACIOL anterior chamber intraocular lens; IOL intraocular lens; PCIOL posterior chamber intraocular lens; Phaco/IOL phacoemulsification with intraocular lens placement; Greasy photorefractive keratectomy; LASIK laser assisted in situ keratomileusis; HTN hypertension; DM diabetes mellitus; COPD chronic obstructive pulmonary disease

## 2018-01-27 ENCOUNTER — Ambulatory Visit (INDEPENDENT_AMBULATORY_CARE_PROVIDER_SITE_OTHER): Payer: Medicare Other | Admitting: Ophthalmology

## 2018-01-27 ENCOUNTER — Encounter (INDEPENDENT_AMBULATORY_CARE_PROVIDER_SITE_OTHER): Payer: Self-pay | Admitting: Ophthalmology

## 2018-01-27 DIAGNOSIS — E113511 Type 2 diabetes mellitus with proliferative diabetic retinopathy with macular edema, right eye: Secondary | ICD-10-CM

## 2018-01-27 DIAGNOSIS — Z961 Presence of intraocular lens: Secondary | ICD-10-CM | POA: Diagnosis not present

## 2018-01-27 DIAGNOSIS — E113552 Type 2 diabetes mellitus with stable proliferative diabetic retinopathy, left eye: Secondary | ICD-10-CM | POA: Diagnosis not present

## 2018-01-27 DIAGNOSIS — H3581 Retinal edema: Secondary | ICD-10-CM

## 2018-01-27 DIAGNOSIS — H18519 Endothelial corneal dystrophy, unspecified eye: Secondary | ICD-10-CM

## 2018-01-27 DIAGNOSIS — H1851 Endothelial corneal dystrophy: Secondary | ICD-10-CM

## 2018-01-27 MED ORDER — BEVACIZUMAB CHEMO INJECTION 1.25MG/0.05ML SYRINGE FOR KALEIDOSCOPE
1.2500 mg | INTRAVITREAL | Status: DC
  Administered 2018-01-27: 1.25 mg via INTRAVITREAL

## 2018-01-28 ENCOUNTER — Encounter (INDEPENDENT_AMBULATORY_CARE_PROVIDER_SITE_OTHER): Payer: Self-pay | Admitting: Ophthalmology

## 2018-01-28 ENCOUNTER — Encounter (INDEPENDENT_AMBULATORY_CARE_PROVIDER_SITE_OTHER): Payer: BC Managed Care – PPO | Admitting: Ophthalmology

## 2018-01-28 DIAGNOSIS — E876 Hypokalemia: Secondary | ICD-10-CM | POA: Diagnosis not present

## 2018-01-28 DIAGNOSIS — N186 End stage renal disease: Secondary | ICD-10-CM | POA: Diagnosis not present

## 2018-01-28 DIAGNOSIS — D631 Anemia in chronic kidney disease: Secondary | ICD-10-CM | POA: Diagnosis not present

## 2018-01-28 DIAGNOSIS — N2581 Secondary hyperparathyroidism of renal origin: Secondary | ICD-10-CM | POA: Diagnosis not present

## 2018-01-28 DIAGNOSIS — E1122 Type 2 diabetes mellitus with diabetic chronic kidney disease: Secondary | ICD-10-CM | POA: Diagnosis not present

## 2018-01-29 DIAGNOSIS — E1129 Type 2 diabetes mellitus with other diabetic kidney complication: Secondary | ICD-10-CM | POA: Diagnosis not present

## 2018-01-29 DIAGNOSIS — N186 End stage renal disease: Secondary | ICD-10-CM | POA: Diagnosis not present

## 2018-01-29 DIAGNOSIS — Z992 Dependence on renal dialysis: Secondary | ICD-10-CM | POA: Diagnosis not present

## 2018-01-31 DIAGNOSIS — N2581 Secondary hyperparathyroidism of renal origin: Secondary | ICD-10-CM | POA: Diagnosis not present

## 2018-01-31 DIAGNOSIS — E876 Hypokalemia: Secondary | ICD-10-CM | POA: Diagnosis not present

## 2018-01-31 DIAGNOSIS — E1122 Type 2 diabetes mellitus with diabetic chronic kidney disease: Secondary | ICD-10-CM | POA: Diagnosis not present

## 2018-01-31 DIAGNOSIS — D631 Anemia in chronic kidney disease: Secondary | ICD-10-CM | POA: Diagnosis not present

## 2018-01-31 DIAGNOSIS — N186 End stage renal disease: Secondary | ICD-10-CM | POA: Diagnosis not present

## 2018-02-02 DIAGNOSIS — N186 End stage renal disease: Secondary | ICD-10-CM | POA: Diagnosis not present

## 2018-02-02 DIAGNOSIS — D631 Anemia in chronic kidney disease: Secondary | ICD-10-CM | POA: Diagnosis not present

## 2018-02-02 DIAGNOSIS — E876 Hypokalemia: Secondary | ICD-10-CM | POA: Diagnosis not present

## 2018-02-02 DIAGNOSIS — E1122 Type 2 diabetes mellitus with diabetic chronic kidney disease: Secondary | ICD-10-CM | POA: Diagnosis not present

## 2018-02-02 DIAGNOSIS — N2581 Secondary hyperparathyroidism of renal origin: Secondary | ICD-10-CM | POA: Diagnosis not present

## 2018-02-04 DIAGNOSIS — N2581 Secondary hyperparathyroidism of renal origin: Secondary | ICD-10-CM | POA: Diagnosis not present

## 2018-02-04 DIAGNOSIS — E1122 Type 2 diabetes mellitus with diabetic chronic kidney disease: Secondary | ICD-10-CM | POA: Diagnosis not present

## 2018-02-04 DIAGNOSIS — E876 Hypokalemia: Secondary | ICD-10-CM | POA: Diagnosis not present

## 2018-02-04 DIAGNOSIS — D631 Anemia in chronic kidney disease: Secondary | ICD-10-CM | POA: Diagnosis not present

## 2018-02-04 DIAGNOSIS — N186 End stage renal disease: Secondary | ICD-10-CM | POA: Diagnosis not present

## 2018-02-07 DIAGNOSIS — N2581 Secondary hyperparathyroidism of renal origin: Secondary | ICD-10-CM | POA: Diagnosis not present

## 2018-02-07 DIAGNOSIS — E1122 Type 2 diabetes mellitus with diabetic chronic kidney disease: Secondary | ICD-10-CM | POA: Diagnosis not present

## 2018-02-07 DIAGNOSIS — D631 Anemia in chronic kidney disease: Secondary | ICD-10-CM | POA: Diagnosis not present

## 2018-02-07 DIAGNOSIS — E876 Hypokalemia: Secondary | ICD-10-CM | POA: Diagnosis not present

## 2018-02-07 DIAGNOSIS — N186 End stage renal disease: Secondary | ICD-10-CM | POA: Diagnosis not present

## 2018-02-09 DIAGNOSIS — N2581 Secondary hyperparathyroidism of renal origin: Secondary | ICD-10-CM | POA: Diagnosis not present

## 2018-02-09 DIAGNOSIS — D631 Anemia in chronic kidney disease: Secondary | ICD-10-CM | POA: Diagnosis not present

## 2018-02-09 DIAGNOSIS — E1122 Type 2 diabetes mellitus with diabetic chronic kidney disease: Secondary | ICD-10-CM | POA: Diagnosis not present

## 2018-02-09 DIAGNOSIS — N186 End stage renal disease: Secondary | ICD-10-CM | POA: Diagnosis not present

## 2018-02-09 DIAGNOSIS — E876 Hypokalemia: Secondary | ICD-10-CM | POA: Diagnosis not present

## 2018-02-11 DIAGNOSIS — N186 End stage renal disease: Secondary | ICD-10-CM | POA: Diagnosis not present

## 2018-02-11 DIAGNOSIS — D631 Anemia in chronic kidney disease: Secondary | ICD-10-CM | POA: Diagnosis not present

## 2018-02-11 DIAGNOSIS — E876 Hypokalemia: Secondary | ICD-10-CM | POA: Diagnosis not present

## 2018-02-11 DIAGNOSIS — E1122 Type 2 diabetes mellitus with diabetic chronic kidney disease: Secondary | ICD-10-CM | POA: Diagnosis not present

## 2018-02-11 DIAGNOSIS — N2581 Secondary hyperparathyroidism of renal origin: Secondary | ICD-10-CM | POA: Diagnosis not present

## 2018-02-14 DIAGNOSIS — E876 Hypokalemia: Secondary | ICD-10-CM | POA: Diagnosis not present

## 2018-02-14 DIAGNOSIS — E1122 Type 2 diabetes mellitus with diabetic chronic kidney disease: Secondary | ICD-10-CM | POA: Diagnosis not present

## 2018-02-14 DIAGNOSIS — N2581 Secondary hyperparathyroidism of renal origin: Secondary | ICD-10-CM | POA: Diagnosis not present

## 2018-02-14 DIAGNOSIS — N186 End stage renal disease: Secondary | ICD-10-CM | POA: Diagnosis not present

## 2018-02-14 DIAGNOSIS — D631 Anemia in chronic kidney disease: Secondary | ICD-10-CM | POA: Diagnosis not present

## 2018-02-16 DIAGNOSIS — N2581 Secondary hyperparathyroidism of renal origin: Secondary | ICD-10-CM | POA: Diagnosis not present

## 2018-02-16 DIAGNOSIS — D631 Anemia in chronic kidney disease: Secondary | ICD-10-CM | POA: Diagnosis not present

## 2018-02-16 DIAGNOSIS — N186 End stage renal disease: Secondary | ICD-10-CM | POA: Diagnosis not present

## 2018-02-16 DIAGNOSIS — E1122 Type 2 diabetes mellitus with diabetic chronic kidney disease: Secondary | ICD-10-CM | POA: Diagnosis not present

## 2018-02-16 DIAGNOSIS — E876 Hypokalemia: Secondary | ICD-10-CM | POA: Diagnosis not present

## 2018-02-18 DIAGNOSIS — N2581 Secondary hyperparathyroidism of renal origin: Secondary | ICD-10-CM | POA: Diagnosis not present

## 2018-02-18 DIAGNOSIS — N186 End stage renal disease: Secondary | ICD-10-CM | POA: Diagnosis not present

## 2018-02-18 DIAGNOSIS — E1122 Type 2 diabetes mellitus with diabetic chronic kidney disease: Secondary | ICD-10-CM | POA: Diagnosis not present

## 2018-02-18 DIAGNOSIS — E876 Hypokalemia: Secondary | ICD-10-CM | POA: Diagnosis not present

## 2018-02-18 DIAGNOSIS — D631 Anemia in chronic kidney disease: Secondary | ICD-10-CM | POA: Diagnosis not present

## 2018-02-21 DIAGNOSIS — D631 Anemia in chronic kidney disease: Secondary | ICD-10-CM | POA: Diagnosis not present

## 2018-02-21 DIAGNOSIS — E876 Hypokalemia: Secondary | ICD-10-CM | POA: Diagnosis not present

## 2018-02-21 DIAGNOSIS — N186 End stage renal disease: Secondary | ICD-10-CM | POA: Diagnosis not present

## 2018-02-21 DIAGNOSIS — N2581 Secondary hyperparathyroidism of renal origin: Secondary | ICD-10-CM | POA: Diagnosis not present

## 2018-02-21 DIAGNOSIS — E1122 Type 2 diabetes mellitus with diabetic chronic kidney disease: Secondary | ICD-10-CM | POA: Diagnosis not present

## 2018-02-22 NOTE — Progress Notes (Signed)
[facility] Note  02/24/2018     CHIEF COMPLAINT Patient presents for Retina Follow Up   HISTORY OF PRESENT ILLNESS: Carolyn Valenzuela is a 67 y.o. female who presents to the clinic today for:   HPI    Retina Follow Up    Patient presents with  Diabetic Retinopathy.  In right eye.  This started 1 month ago.  Severity is mild.  Since onset it is stable.  I, the attending physician,  performed the HPI with the patient and updated documentation appropriately.          Comments    F/U PDR OD. Patient states floaters have decreased, no new visual onsets."I need new glasses".. Bs 125 this am ,BS have been WINL Pt ready Avastin if indicted.       Last edited by Bernarda Caffey, MD on 02/24/2018 10:41 AM. (History)    Pt states she has been having low CBG reading recently; Pt states she is able to see better when CBG is high;   Referring physician: Biagio Borg, MD [place], [place] [id number]  HISTORICAL INFORMATION:   Selected notes from the MEDICAL RECORD NUMBER Referred by Dr. Shirley Muscat for concern of DME OD and hard exudates OS;  LEE- 02.21.19 (S. Bernstorf) [BCVA OD: 20/40 OS: 20/40] Ocular Hx- hx of TRD OD, S/P vitrectomy OD (03/32/10 - [facility]), CR scar OU, DME OU, PDR OU, VH OU, pseudophakia OU (04-12/2006), S/P focal laser OU (06.01.2008), S/P PRP laser (12.08.2009 - [facility], undoctumented with eye), S/p PPV OS (2014 - [facility]);  PMH- DM, HTN, elevated cholesterol, kidney disorder    CURRENT MEDICATIONS: Current Outpatient Medications (Ophthalmic Drugs)  Medication Sig  . PAZEO 0.7 % SOLN INSTILL 1 DROP INTO BOTH EYES EVERY DAY IN THE MORNING  . RESTASIS 0.05 % ophthalmic emulsion INSTILL 1 DROP INTO BOTH EYES EVERY 12 HOURS   Current Facility-Administered Medications (Ophthalmic Drugs)  Medication Route  . triamcinolone acetonide (TRIESENCE) 40 MG/ML subtenons injection 4 mg Intravitreal   Current Outpatient Medications (Other)   Medication Sig  . aspirin 81 MG EC tablet Take 81 mg by mouth daily.    . clopidogrel (PLAVIX) 75 MG tablet TAKE 1 TABLET BY MOUTH EVERY DAY  . famotidine (PEPCID) 20 MG tablet Take 1 tablet (20 mg total) by mouth daily.  [name] gabapentin (NEURONTIN) 100 MG capsule TAKE 2 CAPSULES (200 MG TOTAL) BY MOUTH 3 (THREE) TIMES DAILY.  [name] glipiZIDE (GLUCOTROL XL) 10 MG 24 hr tablet TAKE 1 TABLET BY MOUTH DAILY WITH BREAKFAST.  [name] glucose blood (ONE TOUCH ULTRA TEST) test strip Use as instructed, twice daily code E11.52  . lidocaine-prilocaine (EMLA) cream APPLY A SMALL AMOUNT TO SKIN 3 TIMES A WEEK OVER DIALYSIS SHUNT 30 MINUTES PRIOR TO DIALYSIS  . lidocaine-prilocaine (EMLA) cream APPLY A SMALL AMOUNT TO SKIN 3 TIMES A WEEK OVER DIALYSIS SHUNT 30 MINUTES PRIOR TO DIALYSIS  . losartan (COZAAR) 100 MG tablet Take 1 tablet (100 mg total) by mouth daily.  . multivitamin (RENA-VIT) TABS tablet TAKE 1 TABLET BY MOUTH EVERY DAY AT BEDTIME  . ONETOUCH VERIO test strip USE TO CHECK BLOOD SUGARS TWICE A DAY DX E11.9  . pantoprazole (PROTONIX) 40 MG tablet TAKE 1 TABLET BY MOUTH TWICE A DAY BEFORE MEALS  . saccharomyces boulardii (FLORASTOR) 250 MG capsule Take 1 capsule (250 mg total) by mouth daily.  . sevelamer carbonate (RENVELA) 800 MG tablet Take 1,600 mg  by mouth 3 (three) times daily with meals.  . sitaGLIPtin (JANUVIA) 50 MG tablet Take 1 tablet (50 mg total) by mouth daily.   Current Facility-Administered Medications (Other)  Medication Route  . Bevacizumab (AVASTIN) SOLN 1.25 mg Intravitreal  . Bevacizumab (AVASTIN) SOLN 1.25 mg Intravitreal  . Bevacizumab (AVASTIN) SOLN 1.25 mg Intravitreal  . Bevacizumab (AVASTIN) SOLN 1.25 mg Intravitreal      REVIEW OF SYSTEMS: ROS    Positive for: Musculoskeletal, Endocrine, Eyes   Negative for: Constitutional, Gastrointestinal, Neurological, Skin, Genitourinary, HENT, Cardiovascular, Respiratory, Psychiatric, Allergic/Imm, Heme/Lymph   Last edited by  Cherrie Gauze, COA on 02/24/2018 10:47 AM. (History)       ALLERGIES Allergies  Allergen Reactions  . Oxycodone Itching    PAST MEDICAL HISTORY Past Medical History:  Diagnosis Date  . Allergic rhinitis, cause unspecified 12/08/2010  . ANEMIA-IRON DEFICIENCY 01/25/2008  . Aortic regurgitation   . CAD (coronary artery disease)    a. 07/2016 NSTEMI/PCI: LM nl, LAD 70ost, 59m/d, 80d, D1 40ost, LCX 99ost/p (2.5x16 Synergy DES - 2.75), 40/57m, Om2 100 CTO, RCA  30p/m. Pt eval by CT surgery, not felt to be suitable candidate 2/2 comorbidities.  . CERVICAL RADICULOPATHY, LEFT 01/25/2008  . Chronic systolic CHF (congestive heart failure) ([place])    a. 07/2016 Echo: EF 40-45%, diff HK, sev AI, mild MVP w/ mod to sev MR, PASP 42mmHg.  . Critical lower limb ischemia   . DIABETES MELLITUS, UNCONTROLLED 05/21/2009  . Esophagitis   . ESRD on dialysis ([place])   . Foot ulcer ([place])    right lateral malleolus  . HCAP (healthcare-associated pneumonia) 06/29/2016  . HYPERLIPIDEMIA 03/30/2007  . HYPERTENSION 01/25/2008  . Ischemic cardiomyopathy    a. 07/2016 Echo: EF 40-45%.  . Mitral regurgitation    a. 07/2016 Echo: Mild MVP w/ mod to sev MR.  [name] PVD (peripheral vascular disease) ([place])    a. s/p bilat BKA  . SECONDARY HYPERPARATHYROIDISM 05/21/2009  . Severe aortic regurgitation    a. 07/2016 Echo: Severe AI.   Past Surgical History:  Procedure Laterality Date  . AMPUTATION Right 03/15/2014   Procedure: RIGHT  LEG  BELOW KNEE AMPUTATION ;  Surgeon: [name], MD;  Location: [facility];  Service: Orthopedics;  Laterality: Right;  . AMPUTATION Left 11/29/2015   Procedure: AMPUTATION BELOW KNEE;  Surgeon: Newt Minion, MD;  Location: [facility];  Service: Orthopedics;  Laterality: Left;  . AV FISTULA PLACEMENT Left   . AV FISTULA PLACEMENT Left 09/06/2014   Procedure: ARTERIOVENOUS (AV) FISTULA CREATION-Left Brachiocephalic;  Surgeon: Conrad [place]ville, MD;  Location: [facility];  Service: Vascular;  Laterality: Left;   . CARDIAC CATHETERIZATION N/A 06/30/2016   Procedure: Left Heart Cath and Coronary Angiography;  Surgeon: Burnell Blanks, MD;  Location: [facility];  Service: Cardiovascular;  Laterality: N/A;  . CARDIAC CATHETERIZATION N/A 07/02/2016   Procedure: Coronary Stent Intervention;  Surgeon: Nelva Bush, MD;  Location: [facility];  Service: Cardiovascular;  Laterality: N/A;  . CATARACT EXTRACTION    . COLONOSCOPY    . CORONARY ANGIOPLASTY WITH STENT PLACEMENT  07/02/2016  . ESOPHAGOGASTRODUODENOSCOPY N/A 03/20/2016   Procedure: ESOPHAGOGASTRODUODENOSCOPY (EGD);  Surgeon: Milus Banister, MD;  Location: [facility];  Service: Endoscopy;  Laterality: N/A;  . EYE SURGERY Bilateral    cataracts removed, left eye still has some oil in it.  . INSERTION OF DIALYSIS CATHETER N/A 09/03/2014   Procedure: INSERTION OF DIALYSIS CATHETER RIGHT INTERNAL JUGULAR VEIN;  Surgeon: Conrad , MD;  Location: [facility];  Service: Vascular;  Laterality: N/A;  . PERIPHERAL VASCULAR CATHETERIZATION N/A 08/13/2015   Procedure: Abdominal Aortogram;  Surgeon: Elam Dutch, MD;  Location: [facility];  Service: Cardiovascular;  Laterality: N/A;    FAMILY HISTORY Family History  Problem Relation Age of Onset  . Cancer Mother        Pancreatic    SOCIAL HISTORY Social History   Tobacco Use  . Smoking status: Never Smoker  . Smokeless tobacco: Never Used  Substance Use Topics  . Alcohol use: No    Alcohol/week: 0.0 oz  . Drug use: No         OPHTHALMIC EXAM:  Base Eye Exam    Visual Acuity (Snellen - Linear)      Right Left   Dist [place] 20/40 +1 20/40   Dist ph [place] NI NI       Tonometry (Tonopen, 10:21 AM)      Right Left   Pressure 18 16       Pupils      Pupils Dark Light Shape React APD   Right PERRL 4 3 Round Brisk None   Left PERRL 4 3 Round Brisk None       Visual Fields (Counting fingers)      Left Right    Full Full       Extraocular Movement       Right Left    Full, Ortho Full, Ortho       Neuro/Psych    Oriented x3:  Yes   Mood/Affect:  Normal       Dilation    Both eyes:  1.0% Mydriacyl, Paremyd @ 10:21 AM        Slit Lamp and Fundus Exam    Slit Lamp Exam      Right Left   Lids/Lashes dermatochalasis dermatochalasis   Conjunctiva/Sclera White and quiet White and quiet   Cornea arcus; 1+ PEE; 2+ guttae arcus; 1-2+ guttae   Anterior Chamber Deep and quiet Deep and quiet   Iris Round, dilated; no NVI Round, dilated; large PI at 0600 no NVI   Lens PCIOL in good position PCIOL in good position   Vitreous clear clear       Fundus Exam      Right Left   Disc Mild Pallor Mild Pallor   C/D Ratio 0.3 0.35   Macula Scattered MAs, dot hemorrhages, exudates, blunted foveal reflex with positive edema, Inferior Epiretinal membrane Blunted foveal reflex, scattered Exudates, Retinal pigment epithelial mottling, focal laser scars   Vessels Vascular attenuation, sclerotic arterioles sclerotic arterioles, generalized attenuation   Periphery 360 PRP, scattered areas of fibrosis 360 PRP in place, areas of fibrosis          IMAGING AND PROCEDURES  Imaging and Procedures for 12/30/17  OCT, Retina - OU - Both Eyes       Right Eye Quality was borderline. Central Foveal Thickness: 289. Progression has worsened. Findings include abnormal foveal contour, no SRF, intraretinal fluid, epiretinal membrane, intraretinal hyper-reflective material, outer retinal atrophy, inner retinal atrophy (Persistent IRF inferior fovea).   Left Eye Quality was good. Central Foveal Thickness: 286. Progression has been stable. Findings include abnormal foveal contour, no IRF, no SRF, macular pucker, epiretinal membrane, outer retinal atrophy (Rare cystic change temporally).   Notes *Images captured and stored on drive  Diagnosis / Impression:  OD: DME; ERM - persistent IRF inferior fovea OS: no DME; +  ERM w/ some pucker and preretinal  fibrosis   Clinical management:  See below  Abbreviations: NFP - Normal foveal profile. CME - cystoid macular edema. PED - pigment epithelial detachment. IRF - intraretinal fluid. SRF - subretinal fluid. EZ - ellipsoid zone. ERM - epiretinal membrane. ORA - outer retinal atrophy. ORT - outer retinal tubulation. SRHM - subretinal hyper-reflective material         Intravitreal Injection, Pharmacologic Agent - OD - Right Eye       Time Out 02/24/2018. 11:08 AM. Confirmed correct patient, procedure, site, and patient consented.   Anesthesia Topical anesthesia was used. Anesthetic medications included Lidocaine 2%, Tetracaine 0.5%.   Procedure Preparation included 5% betadine to ocular surface, eyelid speculum. A 27 gauge needle was used.   Injection: 4 mg triamcinolone acetonide 40 MG/ML   NDC: 8338-2505-39    Lot: [id number]    Expiration Date: 07/31/2019   Route: Intravitreal   Site: Right Eye   Waste: 0.9 mL  Post-op Post injection exam found visual acuity of at least counting fingers. The patient tolerated the procedure well. There were no complications. The patient received written and verbal post procedure care education.                 ASSESSMENT/PLAN:    ICD-10-CM   1. Proliferative diabetic retinopathy of right eye with macular edema associated with type 2 diabetes mellitus (HCC) E11.3511 OCT, Retina - OU - Both Eyes    Intravitreal Injection, Pharmacologic Agent - OD - Right Eye    triamcinolone acetonide (TRIESENCE) 40 MG/ML subtenons injection 4 mg    CANCELED: Intravitreal Injection, Pharmacologic Agent - OD - Right Eye  2. Stable proliferative diabetic retinopathy of left eye associated with type 2 diabetes mellitus ([place]) P37.9024   3. Retinal edema H35.81 OCT, Retina - OU - Both Eyes  4. Pseudophakia of both eyes Z96.1   5. Corneal guttata H18.51     1,2,3. Proliferative diabetic retinopathy OU- - s/p PPV OU with [facility] in 2014 - s/p PRP OU with  [facility] - S/P IVA OD #1 (02.26.19), #2 (04.02.19), #3 (05.02.19), #4 (05.30.19) - exam shows good PRP 360 OU; sclerotic arterioles with sheathing OU; scattered DBH OU - BCVA stable at 20/40 - no significant change on IVA - OCT shows persistent diabetic macular edema, OD with minimal change thus far on IVA x4 - discussed switch to IV triescence - recommend IVTA #1 OD today, (06.27.19) - pt wishes to proceed - RBA of procedure discussed, questions answered - informed consent obtained and signed - see procedure note  - f/u in 6 wks  4. Pseudophakia OU  - s/p CE/IOL OU by Dr. Elliot Dally  - beautiful surgery, doing well  - monitor  5. Corneal guttata OU - no edema - monitor   Ophthalmic Meds Ordered this visit:  Meds ordered this encounter  Medications  . triamcinolone acetonide (TRIESENCE) 40 MG/ML subtenons injection 4 mg       Return in about 6 weeks (around 04/07/2018) for F/U PDR OU, DFE OCT.  There are no Patient Instructions on file for this visit.   Explained the diagnoses, plan, and follow up with the patient and they expressed understanding.  Patient expressed understanding of the importance of proper follow up care.   This document serves as a record of services personally performed by Gardiner Sleeper, MD, PhD. It was created on their behalf by Catha Brow, [place], a certified ophthalmic assistant. The creation of this  record is the provider's dictation and/or activities during the visit.  Electronically signed by: Catha Brow, COA  06.25.19 11:23 AM    Gardiner Sleeper, M.D., Ph.D. Diseases & Surgery of the Retina and Vitreous [facility]  I have reviewed the above documentation for accuracy and completeness, and I agree with the above. Gardiner Sleeper, M.D., Ph.D. 02/24/18 11:26 AM    Abbreviations: M myopia (nearsighted); A astigmatism; H hyperopia (farsighted); P presbyopia; Mrx spectacle prescription;  CTL contact lenses; OD right  eye; OS left eye; OU both eyes  XT exotropia; ET esotropia; PEK punctate epithelial keratitis; PEE punctate epithelial erosions; DES dry eye syndrome; MGD meibomian gland dysfunction; ATs artificial tears; PFAT's preservative free artificial tears; [place] nuclear sclerotic cataract; PSC posterior subcapsular cataract; ERM epi-retinal membrane; PVD posterior vitreous detachment; RD retinal detachment; DM diabetes mellitus; DR diabetic retinopathy; NPDR non-proliferative diabetic retinopathy; PDR proliferative diabetic retinopathy; CSME clinically significant macular edema; DME diabetic macular edema; dbh dot blot hemorrhages; CWS cotton wool spot; POAG primary open angle glaucoma; C/D cup-to-disc ratio; HVF humphrey visual field; GVF goldmann visual field; OCT optical coherence tomography; IOP intraocular pressure; BRVO Branch retinal vein occlusion; CRVO central retinal vein occlusion; CRAO central retinal artery occlusion; BRAO branch retinal artery occlusion; RT retinal tear; SB scleral buckle; PPV pars plana vitrectomy; VH Vitreous hemorrhage; PRP panretinal laser photocoagulation; IVK intravitreal kenalog; VMT vitreomacular traction; MH Macular hole;  NVD neovascularization of the disc; NVE neovascularization elsewhere; AREDS age related eye disease study; ARMD age related macular degeneration; POAG primary open angle glaucoma; EBMD epithelial/anterior basement membrane dystrophy; ACIOL anterior chamber intraocular lens; IOL intraocular lens; PCIOL posterior chamber intraocular lens; Phaco/IOL phacoemulsification with intraocular lens placement; [place] photorefractive keratectomy; LASIK laser assisted in situ keratomileusis; HTN hypertension; DM diabetes mellitus; COPD chronic obstructive pulmonary disease

## 2018-02-23 DIAGNOSIS — N2581 Secondary hyperparathyroidism of renal origin: Secondary | ICD-10-CM | POA: Diagnosis not present

## 2018-02-23 DIAGNOSIS — D631 Anemia in chronic kidney disease: Secondary | ICD-10-CM | POA: Diagnosis not present

## 2018-02-23 DIAGNOSIS — E1122 Type 2 diabetes mellitus with diabetic chronic kidney disease: Secondary | ICD-10-CM | POA: Diagnosis not present

## 2018-02-23 DIAGNOSIS — N186 End stage renal disease: Secondary | ICD-10-CM | POA: Diagnosis not present

## 2018-02-23 DIAGNOSIS — E876 Hypokalemia: Secondary | ICD-10-CM | POA: Diagnosis not present

## 2018-02-24 ENCOUNTER — Other Ambulatory Visit: Payer: Self-pay | Admitting: Internal Medicine

## 2018-02-24 ENCOUNTER — Ambulatory Visit (INDEPENDENT_AMBULATORY_CARE_PROVIDER_SITE_OTHER): Payer: BC Managed Care – PPO | Admitting: Ophthalmology

## 2018-02-24 ENCOUNTER — Encounter (INDEPENDENT_AMBULATORY_CARE_PROVIDER_SITE_OTHER): Payer: Self-pay | Admitting: Ophthalmology

## 2018-02-24 DIAGNOSIS — H1851 Endothelial corneal dystrophy: Secondary | ICD-10-CM

## 2018-02-24 DIAGNOSIS — Z961 Presence of intraocular lens: Secondary | ICD-10-CM

## 2018-02-24 DIAGNOSIS — H3581 Retinal edema: Secondary | ICD-10-CM | POA: Diagnosis not present

## 2018-02-24 DIAGNOSIS — H18519 Endothelial corneal dystrophy, unspecified eye: Secondary | ICD-10-CM

## 2018-02-24 DIAGNOSIS — E113511 Type 2 diabetes mellitus with proliferative diabetic retinopathy with macular edema, right eye: Secondary | ICD-10-CM

## 2018-02-24 DIAGNOSIS — E113552 Type 2 diabetes mellitus with stable proliferative diabetic retinopathy, left eye: Secondary | ICD-10-CM

## 2018-02-24 MED ORDER — TRIAMCINOLONE ACETONIDE 40 MG/ML IO SUSP
4.0000 mg | INTRAOCULAR | Status: DC
  Administered 2018-02-24: 4 mg via INTRAVITREAL

## 2018-02-25 DIAGNOSIS — E1122 Type 2 diabetes mellitus with diabetic chronic kidney disease: Secondary | ICD-10-CM | POA: Diagnosis not present

## 2018-02-25 DIAGNOSIS — D631 Anemia in chronic kidney disease: Secondary | ICD-10-CM | POA: Diagnosis not present

## 2018-02-25 DIAGNOSIS — E876 Hypokalemia: Secondary | ICD-10-CM | POA: Diagnosis not present

## 2018-02-25 DIAGNOSIS — N186 End stage renal disease: Secondary | ICD-10-CM | POA: Diagnosis not present

## 2018-02-25 DIAGNOSIS — N2581 Secondary hyperparathyroidism of renal origin: Secondary | ICD-10-CM | POA: Diagnosis not present

## 2018-02-28 DIAGNOSIS — Z992 Dependence on renal dialysis: Secondary | ICD-10-CM | POA: Diagnosis not present

## 2018-02-28 DIAGNOSIS — E876 Hypokalemia: Secondary | ICD-10-CM | POA: Diagnosis not present

## 2018-02-28 DIAGNOSIS — E1129 Type 2 diabetes mellitus with other diabetic kidney complication: Secondary | ICD-10-CM | POA: Diagnosis not present

## 2018-02-28 DIAGNOSIS — D631 Anemia in chronic kidney disease: Secondary | ICD-10-CM | POA: Diagnosis not present

## 2018-02-28 DIAGNOSIS — N2581 Secondary hyperparathyroidism of renal origin: Secondary | ICD-10-CM | POA: Diagnosis not present

## 2018-02-28 DIAGNOSIS — E1122 Type 2 diabetes mellitus with diabetic chronic kidney disease: Secondary | ICD-10-CM | POA: Diagnosis not present

## 2018-02-28 DIAGNOSIS — N186 End stage renal disease: Secondary | ICD-10-CM | POA: Diagnosis not present

## 2018-03-02 DIAGNOSIS — E876 Hypokalemia: Secondary | ICD-10-CM | POA: Diagnosis not present

## 2018-03-02 DIAGNOSIS — N2581 Secondary hyperparathyroidism of renal origin: Secondary | ICD-10-CM | POA: Diagnosis not present

## 2018-03-02 DIAGNOSIS — E1122 Type 2 diabetes mellitus with diabetic chronic kidney disease: Secondary | ICD-10-CM | POA: Diagnosis not present

## 2018-03-02 DIAGNOSIS — D631 Anemia in chronic kidney disease: Secondary | ICD-10-CM | POA: Diagnosis not present

## 2018-03-02 DIAGNOSIS — N186 End stage renal disease: Secondary | ICD-10-CM | POA: Diagnosis not present

## 2018-03-04 DIAGNOSIS — E876 Hypokalemia: Secondary | ICD-10-CM | POA: Diagnosis not present

## 2018-03-04 DIAGNOSIS — E1122 Type 2 diabetes mellitus with diabetic chronic kidney disease: Secondary | ICD-10-CM | POA: Diagnosis not present

## 2018-03-04 DIAGNOSIS — N2581 Secondary hyperparathyroidism of renal origin: Secondary | ICD-10-CM | POA: Diagnosis not present

## 2018-03-04 DIAGNOSIS — N186 End stage renal disease: Secondary | ICD-10-CM | POA: Diagnosis not present

## 2018-03-04 DIAGNOSIS — D631 Anemia in chronic kidney disease: Secondary | ICD-10-CM | POA: Diagnosis not present

## 2018-03-07 ENCOUNTER — Telehealth: Payer: Self-pay | Admitting: Cardiovascular Disease

## 2018-03-07 DIAGNOSIS — E1122 Type 2 diabetes mellitus with diabetic chronic kidney disease: Secondary | ICD-10-CM | POA: Diagnosis not present

## 2018-03-07 DIAGNOSIS — E876 Hypokalemia: Secondary | ICD-10-CM | POA: Diagnosis not present

## 2018-03-07 DIAGNOSIS — D631 Anemia in chronic kidney disease: Secondary | ICD-10-CM | POA: Diagnosis not present

## 2018-03-07 DIAGNOSIS — N186 End stage renal disease: Secondary | ICD-10-CM | POA: Diagnosis not present

## 2018-03-07 DIAGNOSIS — N2581 Secondary hyperparathyroidism of renal origin: Secondary | ICD-10-CM | POA: Diagnosis not present

## 2018-03-07 NOTE — Telephone Encounter (Signed)
New message   Pt c/o Shortness Of Breath: STAT if SOB developed within the last 24 hours or pt is noticeably SOB on the phone  1. Are you currently SOB (can you hear that pt is SOB on the phone)? Yes 2. How long have you been experiencing SOB? 3 days  3. Are you SOB when sitting or when up moving around? both  4. Are you currently experiencing any other symptoms? No

## 2018-03-07 NOTE — Telephone Encounter (Signed)
Spoke with patient and offered appointment today, she is not able to come today. She does have appointment tomorrow with Dr Gwenlyn Found tomorrow. advised to keep appointment tomorrow and rest today, go to ED if worse. Denies any chest pain or swelling.

## 2018-03-08 ENCOUNTER — Encounter: Payer: Self-pay | Admitting: Cardiovascular Disease

## 2018-03-08 ENCOUNTER — Telehealth: Payer: Self-pay | Admitting: *Deleted

## 2018-03-08 ENCOUNTER — Ambulatory Visit (INDEPENDENT_AMBULATORY_CARE_PROVIDER_SITE_OTHER): Payer: Medicare Other | Admitting: Cardiovascular Disease

## 2018-03-08 VITALS — BP 183/75 | HR 81 | Wt 139.0 lb

## 2018-03-08 DIAGNOSIS — I998 Other disorder of circulatory system: Secondary | ICD-10-CM

## 2018-03-08 DIAGNOSIS — I70229 Atherosclerosis of native arteries of extremities with rest pain, unspecified extremity: Secondary | ICD-10-CM

## 2018-03-08 DIAGNOSIS — I214 Non-ST elevation (NSTEMI) myocardial infarction: Secondary | ICD-10-CM | POA: Diagnosis not present

## 2018-03-08 DIAGNOSIS — I10 Essential (primary) hypertension: Secondary | ICD-10-CM | POA: Diagnosis not present

## 2018-03-08 DIAGNOSIS — E7800 Pure hypercholesterolemia, unspecified: Secondary | ICD-10-CM

## 2018-03-08 MED ORDER — AMLODIPINE BESYLATE 5 MG PO TABS: 5.0000 mg | ORAL_TABLET | Freq: Every day | ORAL | 3 refills | Status: DC

## 2018-03-08 MED ORDER — NEBIVOLOL HCL 5 MG PO TABS: 5.0000 mg | ORAL_TABLET | Freq: Every day | ORAL | 3 refills | Status: DC

## 2018-03-08 NOTE — Assessment & Plan Note (Signed)
History of CAD status post non-STEMI with cath performed by Dr. Saunders Revel 03/01/2016 stenting of the circumflex coronary artery with a synergy drug-eluting stent.  He did have catheterization performed prior to that by Dr. Angelena Form revealing surgical anatomy although she was turned down for surgical revascularization because of her frailty and comorbidities.  Over the last month she has complained of increasing dyspnea on exertion but denies chest pain.  A 2D echocardiogram performed 10/12/2017 revealed normal LV systolic function with grade 1 diastolic dysfunction and no significant valvular abnormalities.

## 2018-03-08 NOTE — Telephone Encounter (Signed)
Spoke with Carolyn Valenzuela, updated medication list. Per dr berry patient will start bystolic 5 mg once daily. Encouraged patient to bring all medications to next office visit. New script sent to the pharmacy

## 2018-03-08 NOTE — Assessment & Plan Note (Signed)
History of critical limb ischemia status post bilateral BKA's currently wearing bilateral prostheses and ambulating with cane.

## 2018-03-08 NOTE — Assessment & Plan Note (Signed)
History of hyperlipidemia not on statin therapy. 

## 2018-03-08 NOTE — Assessment & Plan Note (Signed)
History of essential hypertension her blood pressure measured at 183/75.  She is on losartan.  She gets hemodialysis on Monday Wednesday and Friday.  Her weight is up just a few pounds from February.  I am going to start her on amlodipine 5 mg a day.

## 2018-03-08 NOTE — Progress Notes (Signed)
03/08/2018 [name]   08/04/1951  [id number]  Primary Physician Biagio Borg, MD Primary Cardiologist: Lorretta Harp MD FACP, [place], [place], [place]  HPI:  Carolyn Valenzuela is a 67 y.o.  with h/o type 2 diabetes, hyperlipidemia, Hypertension, chronic kidney diseasecurrently on hemodialysiswho was referredfor evaluation of peripheral vascular disease.I last saw her in the office  09/28/2017. Patient reports that she has been having some discomfort in the lateral aspects of her right and left toes. Pain is present both at rest and with activity, has been ongoing for 4-5 months. It is worst with touch. She also has a chronic wound on her right malleolus for which she is seen by her podiatrist, Dr. Elby Showers. She otherwise denies any pain in her calves or legs with ambulation.  She denies any shortness of breath, chest pain, recent lower extremity swelling or palpitations.  She works at [employer] at [employer] and is on her feet all day. She doesn't exercise on a regular basis.  She has never smoked. She denies any history of heart disease and any family history of ischemic heart disease.  I performed lower extremity arterial Doppler studies which did not show significant obstructive disease however she is to have diminished TBIs bilaterally suggesting the possibility of poor healing as a result of a surgical procedure..She ultimately has undergone bilateral below-the-knee amputations by Dr. Doran Durand and Sharol Given.In addition, she has known CAD documented angiographically in October of last year by Dr. Angelena Form she had surgical anatomy however she was turned down for surgical revascularization because of frailty and comorbidities. She'll underwent circumflex stenting by Dr. ChrisEnd11/2/17 with a synergy drug-eluting stent. A 2-D echocardiogram performed 10/12/2017 revealed normal LV systolic function with grade 1 diastolic dysfunction.  Since I saw her 6 months ago she is  complained of increasing dyspnea over the last month.  She denies chest pain.    Current Meds  Medication Sig  . aspirin 81 MG EC tablet Take 81 mg by mouth daily.    . clopidogrel (PLAVIX) 75 MG tablet TAKE 1 TABLET BY MOUTH EVERY DAY  . famotidine (PEPCID) 20 MG tablet Take 1 tablet (20 mg total) by mouth daily.  [name] gabapentin (NEURONTIN) 100 MG capsule TAKE 2 CAPSULES (200 MG TOTAL) BY MOUTH 3 (THREE) TIMES DAILY.  [name] glipiZIDE (GLUCOTROL XL) 10 MG 24 hr tablet TAKE 1 TABLET BY MOUTH DAILY WITH BREAKFAST.  [name] glucose blood (ONE TOUCH ULTRA TEST) test strip Use as instructed, twice daily code E11.52  . lidocaine-prilocaine (EMLA) cream APPLY A SMALL AMOUNT TO SKIN 3 TIMES A WEEK OVER DIALYSIS SHUNT 30 MINUTES PRIOR TO DIALYSIS  . lidocaine-prilocaine (EMLA) cream APPLY A SMALL AMOUNT TO SKIN 3 TIMES A WEEK OVER DIALYSIS SHUNT 30 MINUTES PRIOR TO DIALYSIS  . losartan (COZAAR) 100 MG tablet Take 1 tablet (100 mg total) by mouth daily.  . multivitamin (RENA-VIT) TABS tablet TAKE 1 TABLET BY MOUTH EVERY DAY AT BEDTIME  . ONETOUCH VERIO test strip USE TO CHECK BLOOD SUGARS TWICE A DAY DX E11.9  . pantoprazole (PROTONIX) 40 MG tablet TAKE 1 TABLET BY MOUTH TWICE A DAY BEFORE MEALS  . PAZEO 0.7 % SOLN INSTILL 1 DROP INTO BOTH EYES EVERY DAY IN THE MORNING  . RESTASIS 0.05 % ophthalmic emulsion INSTILL 1 DROP INTO BOTH EYES EVERY 12 HOURS  . saccharomyces boulardii (FLORASTOR) 250 MG capsule Take 1 capsule (250 mg total) by mouth daily.  . sevelamer carbonate (RENVELA)  800 MG tablet Take 1,600 mg by mouth 3 (three) times daily with meals.  . sitaGLIPtin (JANUVIA) 50 MG tablet Take 1 tablet (50 mg total) by mouth daily.   Current Facility-Administered Medications for the 03/08/18 encounter (Office Visit) with Lorretta Harp, MD  Medication  . Bevacizumab (AVASTIN) SOLN 1.25 mg  . Bevacizumab (AVASTIN) SOLN 1.25 mg  . Bevacizumab (AVASTIN) SOLN 1.25 mg  . Bevacizumab (AVASTIN) SOLN 1.25 mg  .  triamcinolone acetonide (TRIESENCE) 40 MG/ML subtenons injection 4 mg     Allergies  Allergen Reactions  . Oxycodone Itching    Social History   Socioeconomic History  . Marital status: Single    Spouse name: Not on file  . Number of children: Not on file  . Years of education: Not on file  . Highest education level: Not on file  Occupational History  . Occupation: Conservation officer, historic buildings: [employer]  . Financial resource strain: Not on file  . Food insecurity:    Worry: Not on file    Inability: Not on file  . Transportation needs:    Medical: Not on file    Non-medical: Not on file  Tobacco Use  . Smoking status: Never Smoker  . Smokeless tobacco: Never Used  Substance and Sexual Activity  . Alcohol use: No    Alcohol/week: 0.0 oz  . Drug use: No  . Sexual activity: Not on file  Lifestyle  . Physical activity:    Days per week: Not on file    Minutes per session: Not on file  . Stress: Not on file  Relationships  . Social connections:    Talks on phone: Not on file    Gets together: Not on file    Attends religious service: Not on file    Active member of club or organization: Not on file    Attends meetings of clubs or organizations: Not on file    Relationship status: Not on file  . Intimate partner violence:    Fear of current or ex partner: Not on file    Emotionally abused: Not on file    Physically abused: Not on file    Forced sexual activity: Not on file  Other Topics Concern  . Not on file  Social History Narrative  . Not on file     Review of Systems: General: negative for chills, fever, night sweats or weight changes.  Cardiovascular: negative for chest pain, dyspnea on exertion, edema, orthopnea, palpitations, paroxysmal nocturnal dyspnea or shortness of breath Dermatological: negative for rash Respiratory: negative for cough or wheezing Urologic: negative for hematuria Abdominal: negative for nausea, vomiting,  diarrhea, bright red blood per rectum, melena, or hematemesis Neurologic: negative for visual changes, syncope, or dizziness All other systems reviewed and are otherwise negative except as noted above.    Blood pressure (!) 183/75, pulse 81, weight 139 lb (63 kg).  General appearance: alert and no distress Neck: no adenopathy, no carotid bruit, no JVD, supple, symmetrical, trachea midline and thyroid not enlarged, symmetric, no tenderness/mass/nodules Lungs: clear to auscultation bilaterally Heart: regular rate and rhythm, S1, S2 normal, no murmur, click, rub or gallop Extremities: extremities normal, atraumatic, no cyanosis or edema Pulses: Bilateral BKA's Skin: Skin color, texture, turgor normal. No rashes or lesions Neurologic: Alert and oriented X 3, normal strength and tone. Normal symmetric reflexes. Normal coordination and gait  EKG sinus rhythm at 81 with nonspecific ST and T wave changes.  I personally reviewed this EKG.  ASSESSMENT AND PLAN:   Hyperlipidemia History of hyperlipidemia not on statin therapy  Essential hypertension History of essential hypertension her blood pressure measured at 183/75.  She is on losartan.  She gets hemodialysis on Monday Wednesday and Friday.  Her weight is up just a few pounds from February.  I am going to start her on amlodipine 5 mg a day.  Critical lower limb ischemia History of critical limb ischemia status post bilateral BKA's currently wearing bilateral prostheses and ambulating with cane.  NSTEMI (non-ST elevated myocardial infarction) ([place]) History of CAD status post non-STEMI with cath performed by Dr. Saunders Revel 03/01/2016 stenting of the circumflex coronary artery with a synergy drug-eluting stent.  He did have catheterization performed prior to that by Dr. Angelena Form revealing surgical anatomy although she was turned down for surgical revascularization because of her frailty and comorbidities.  Over the last month she has complained of  increasing dyspnea on exertion but denies chest pain.  A 2D echocardiogram performed 10/12/2017 revealed normal LV systolic function with grade 1 diastolic dysfunction and no significant valvular abnormalities.      Lorretta Harp MD FACP,FACC,FAHA, [facility] 03/08/2018 12:15 PM

## 2018-03-08 NOTE — Patient Instructions (Signed)
Medication Instructions:   START AMLODIPINE 5 MG ONCE DAILY  Follow-Up:  Your physician recommends that you schedule a follow-up appointment in: 1 MONTH WITH EXTENDER TO FOLLOW UP ON BLOOD PRESSURE  Your physician wants you to follow-up in: [place] will receive a reminder letter in the mail two months in advance. If you don't receive a letter, please call our office to schedule the follow-up appointment.    If you need a refill on your cardiac medications before your next appointment, please call your pharmacy.

## 2018-03-09 DIAGNOSIS — E1122 Type 2 diabetes mellitus with diabetic chronic kidney disease: Secondary | ICD-10-CM | POA: Diagnosis not present

## 2018-03-09 DIAGNOSIS — D631 Anemia in chronic kidney disease: Secondary | ICD-10-CM | POA: Diagnosis not present

## 2018-03-09 DIAGNOSIS — N2581 Secondary hyperparathyroidism of renal origin: Secondary | ICD-10-CM | POA: Diagnosis not present

## 2018-03-09 DIAGNOSIS — E876 Hypokalemia: Secondary | ICD-10-CM | POA: Diagnosis not present

## 2018-03-09 DIAGNOSIS — N186 End stage renal disease: Secondary | ICD-10-CM | POA: Diagnosis not present

## 2018-03-11 DIAGNOSIS — E1122 Type 2 diabetes mellitus with diabetic chronic kidney disease: Secondary | ICD-10-CM | POA: Diagnosis not present

## 2018-03-11 DIAGNOSIS — D631 Anemia in chronic kidney disease: Secondary | ICD-10-CM | POA: Diagnosis not present

## 2018-03-11 DIAGNOSIS — N186 End stage renal disease: Secondary | ICD-10-CM | POA: Diagnosis not present

## 2018-03-11 DIAGNOSIS — E876 Hypokalemia: Secondary | ICD-10-CM | POA: Diagnosis not present

## 2018-03-11 DIAGNOSIS — N2581 Secondary hyperparathyroidism of renal origin: Secondary | ICD-10-CM | POA: Diagnosis not present

## 2018-03-14 DIAGNOSIS — E1122 Type 2 diabetes mellitus with diabetic chronic kidney disease: Secondary | ICD-10-CM | POA: Diagnosis not present

## 2018-03-14 DIAGNOSIS — N186 End stage renal disease: Secondary | ICD-10-CM | POA: Diagnosis not present

## 2018-03-14 DIAGNOSIS — D631 Anemia in chronic kidney disease: Secondary | ICD-10-CM | POA: Diagnosis not present

## 2018-03-14 DIAGNOSIS — E876 Hypokalemia: Secondary | ICD-10-CM | POA: Diagnosis not present

## 2018-03-14 DIAGNOSIS — N2581 Secondary hyperparathyroidism of renal origin: Secondary | ICD-10-CM | POA: Diagnosis not present

## 2018-03-16 DIAGNOSIS — N186 End stage renal disease: Secondary | ICD-10-CM | POA: Diagnosis not present

## 2018-03-16 DIAGNOSIS — D631 Anemia in chronic kidney disease: Secondary | ICD-10-CM | POA: Diagnosis not present

## 2018-03-16 DIAGNOSIS — E1122 Type 2 diabetes mellitus with diabetic chronic kidney disease: Secondary | ICD-10-CM | POA: Diagnosis not present

## 2018-03-16 DIAGNOSIS — N2581 Secondary hyperparathyroidism of renal origin: Secondary | ICD-10-CM | POA: Diagnosis not present

## 2018-03-16 DIAGNOSIS — E876 Hypokalemia: Secondary | ICD-10-CM | POA: Diagnosis not present

## 2018-03-18 DIAGNOSIS — N186 End stage renal disease: Secondary | ICD-10-CM | POA: Diagnosis not present

## 2018-03-18 DIAGNOSIS — E1122 Type 2 diabetes mellitus with diabetic chronic kidney disease: Secondary | ICD-10-CM | POA: Diagnosis not present

## 2018-03-18 DIAGNOSIS — E876 Hypokalemia: Secondary | ICD-10-CM | POA: Diagnosis not present

## 2018-03-18 DIAGNOSIS — D631 Anemia in chronic kidney disease: Secondary | ICD-10-CM | POA: Diagnosis not present

## 2018-03-18 DIAGNOSIS — N2581 Secondary hyperparathyroidism of renal origin: Secondary | ICD-10-CM | POA: Diagnosis not present

## 2018-03-21 DIAGNOSIS — D631 Anemia in chronic kidney disease: Secondary | ICD-10-CM | POA: Diagnosis not present

## 2018-03-21 DIAGNOSIS — E1122 Type 2 diabetes mellitus with diabetic chronic kidney disease: Secondary | ICD-10-CM | POA: Diagnosis not present

## 2018-03-21 DIAGNOSIS — E876 Hypokalemia: Secondary | ICD-10-CM | POA: Diagnosis not present

## 2018-03-21 DIAGNOSIS — N186 End stage renal disease: Secondary | ICD-10-CM | POA: Diagnosis not present

## 2018-03-21 DIAGNOSIS — N2581 Secondary hyperparathyroidism of renal origin: Secondary | ICD-10-CM | POA: Diagnosis not present

## 2018-03-23 DIAGNOSIS — N2581 Secondary hyperparathyroidism of renal origin: Secondary | ICD-10-CM | POA: Diagnosis not present

## 2018-03-23 DIAGNOSIS — E1122 Type 2 diabetes mellitus with diabetic chronic kidney disease: Secondary | ICD-10-CM | POA: Diagnosis not present

## 2018-03-23 DIAGNOSIS — E876 Hypokalemia: Secondary | ICD-10-CM | POA: Diagnosis not present

## 2018-03-23 DIAGNOSIS — D631 Anemia in chronic kidney disease: Secondary | ICD-10-CM | POA: Diagnosis not present

## 2018-03-23 DIAGNOSIS — N186 End stage renal disease: Secondary | ICD-10-CM | POA: Diagnosis not present

## 2018-03-25 DIAGNOSIS — E1122 Type 2 diabetes mellitus with diabetic chronic kidney disease: Secondary | ICD-10-CM | POA: Diagnosis not present

## 2018-03-25 DIAGNOSIS — E876 Hypokalemia: Secondary | ICD-10-CM | POA: Diagnosis not present

## 2018-03-25 DIAGNOSIS — N2581 Secondary hyperparathyroidism of renal origin: Secondary | ICD-10-CM | POA: Diagnosis not present

## 2018-03-25 DIAGNOSIS — D631 Anemia in chronic kidney disease: Secondary | ICD-10-CM | POA: Diagnosis not present

## 2018-03-25 DIAGNOSIS — N186 End stage renal disease: Secondary | ICD-10-CM | POA: Diagnosis not present

## 2018-03-27 ENCOUNTER — Other Ambulatory Visit: Payer: Self-pay | Admitting: Internal Medicine

## 2018-03-28 DIAGNOSIS — E876 Hypokalemia: Secondary | ICD-10-CM | POA: Diagnosis not present

## 2018-03-28 DIAGNOSIS — E1122 Type 2 diabetes mellitus with diabetic chronic kidney disease: Secondary | ICD-10-CM | POA: Diagnosis not present

## 2018-03-28 DIAGNOSIS — N2581 Secondary hyperparathyroidism of renal origin: Secondary | ICD-10-CM | POA: Diagnosis not present

## 2018-03-28 DIAGNOSIS — D631 Anemia in chronic kidney disease: Secondary | ICD-10-CM | POA: Diagnosis not present

## 2018-03-28 DIAGNOSIS — N186 End stage renal disease: Secondary | ICD-10-CM | POA: Diagnosis not present

## 2018-03-30 DIAGNOSIS — N2581 Secondary hyperparathyroidism of renal origin: Secondary | ICD-10-CM | POA: Diagnosis not present

## 2018-03-30 DIAGNOSIS — E1122 Type 2 diabetes mellitus with diabetic chronic kidney disease: Secondary | ICD-10-CM | POA: Diagnosis not present

## 2018-03-30 DIAGNOSIS — D631 Anemia in chronic kidney disease: Secondary | ICD-10-CM | POA: Diagnosis not present

## 2018-03-30 DIAGNOSIS — N186 End stage renal disease: Secondary | ICD-10-CM | POA: Diagnosis not present

## 2018-03-30 DIAGNOSIS — E876 Hypokalemia: Secondary | ICD-10-CM | POA: Diagnosis not present

## 2018-03-31 DIAGNOSIS — Z992 Dependence on renal dialysis: Secondary | ICD-10-CM | POA: Diagnosis not present

## 2018-03-31 DIAGNOSIS — E1129 Type 2 diabetes mellitus with other diabetic kidney complication: Secondary | ICD-10-CM | POA: Diagnosis not present

## 2018-03-31 DIAGNOSIS — N186 End stage renal disease: Secondary | ICD-10-CM | POA: Diagnosis not present

## 2018-04-01 DIAGNOSIS — D631 Anemia in chronic kidney disease: Secondary | ICD-10-CM | POA: Diagnosis not present

## 2018-04-01 DIAGNOSIS — E1122 Type 2 diabetes mellitus with diabetic chronic kidney disease: Secondary | ICD-10-CM | POA: Diagnosis not present

## 2018-04-01 DIAGNOSIS — E876 Hypokalemia: Secondary | ICD-10-CM | POA: Diagnosis not present

## 2018-04-01 DIAGNOSIS — N2581 Secondary hyperparathyroidism of renal origin: Secondary | ICD-10-CM | POA: Diagnosis not present

## 2018-04-01 DIAGNOSIS — N186 End stage renal disease: Secondary | ICD-10-CM | POA: Diagnosis not present

## 2018-04-04 ENCOUNTER — Other Ambulatory Visit: Payer: Self-pay | Admitting: *Deleted

## 2018-04-04 DIAGNOSIS — N2581 Secondary hyperparathyroidism of renal origin: Secondary | ICD-10-CM | POA: Diagnosis not present

## 2018-04-04 DIAGNOSIS — D631 Anemia in chronic kidney disease: Secondary | ICD-10-CM | POA: Diagnosis not present

## 2018-04-04 DIAGNOSIS — N186 End stage renal disease: Secondary | ICD-10-CM | POA: Diagnosis not present

## 2018-04-04 DIAGNOSIS — E1122 Type 2 diabetes mellitus with diabetic chronic kidney disease: Secondary | ICD-10-CM | POA: Diagnosis not present

## 2018-04-04 DIAGNOSIS — E876 Hypokalemia: Secondary | ICD-10-CM | POA: Diagnosis not present

## 2018-04-04 MED ORDER — PANTOPRAZOLE SODIUM 40 MG PO TBEC: 40.0000 mg | DELAYED_RELEASE_TABLET | Freq: Two times a day (BID) | ORAL | 5 refills | Status: DC

## 2018-04-06 DIAGNOSIS — D631 Anemia in chronic kidney disease: Secondary | ICD-10-CM | POA: Diagnosis not present

## 2018-04-06 DIAGNOSIS — N186 End stage renal disease: Secondary | ICD-10-CM | POA: Diagnosis not present

## 2018-04-06 DIAGNOSIS — E1122 Type 2 diabetes mellitus with diabetic chronic kidney disease: Secondary | ICD-10-CM | POA: Diagnosis not present

## 2018-04-06 DIAGNOSIS — N2581 Secondary hyperparathyroidism of renal origin: Secondary | ICD-10-CM | POA: Diagnosis not present

## 2018-04-06 DIAGNOSIS — E876 Hypokalemia: Secondary | ICD-10-CM | POA: Diagnosis not present

## 2018-04-07 ENCOUNTER — Encounter: Payer: Self-pay | Admitting: Internal Medicine

## 2018-04-07 ENCOUNTER — Ambulatory Visit (INDEPENDENT_AMBULATORY_CARE_PROVIDER_SITE_OTHER): Payer: Medicare Other | Admitting: Internal Medicine

## 2018-04-07 ENCOUNTER — Other Ambulatory Visit (INDEPENDENT_AMBULATORY_CARE_PROVIDER_SITE_OTHER): Payer: Medicare Other

## 2018-04-07 VITALS — BP 126/78 | HR 84 | Temp 98.7°F | Ht 59.0 in | Wt 140.0 lb

## 2018-04-07 DIAGNOSIS — N186 End stage renal disease: Secondary | ICD-10-CM

## 2018-04-07 DIAGNOSIS — E7800 Pure hypercholesterolemia, unspecified: Secondary | ICD-10-CM | POA: Diagnosis not present

## 2018-04-07 DIAGNOSIS — I10 Essential (primary) hypertension: Secondary | ICD-10-CM | POA: Diagnosis not present

## 2018-04-07 DIAGNOSIS — E1122 Type 2 diabetes mellitus with diabetic chronic kidney disease: Secondary | ICD-10-CM | POA: Diagnosis not present

## 2018-04-07 LAB — CBC WITH DIFFERENTIAL/PLATELET
Basophils Absolute: 0.1 10*3/uL (ref 0.0–0.1)
Basophils Relative: 1.3 % (ref 0.0–3.0)
Eosinophils Absolute: 0.1 10*3/uL (ref 0.0–0.7)
Eosinophils Relative: 1.6 % (ref 0.0–5.0)
HCT: 29 % — ABNORMAL LOW (ref 36.0–46.0)
Hemoglobin: 9.7 g/dL — ABNORMAL LOW (ref 12.0–15.0)
Lymphocytes Relative: 23.1 % (ref 12.0–46.0)
Lymphs Abs: 1.9 10*3/uL (ref 0.7–4.0)
MCHC: 33.4 g/dL (ref 30.0–36.0)
MCV: 96.4 fl (ref 78.0–100.0)
Monocytes Absolute: 0.6 10*3/uL (ref 0.1–1.0)
Monocytes Relative: 7.4 % (ref 3.0–12.0)
Neutro Abs: 5.4 10*3/uL (ref 1.4–7.7)
Neutrophils Relative %: 66.6 % (ref 43.0–77.0)
Platelets: 237 10*3/uL (ref 150.0–400.0)
RBC: 3 Mil/uL — ABNORMAL LOW (ref 3.87–5.11)
RDW: 17.6 % — ABNORMAL HIGH (ref 11.5–15.5)
WBC: 8.1 10*3/uL (ref 4.0–10.5)

## 2018-04-07 LAB — BASIC METABOLIC PANEL
BUN: 21 mg/dL (ref 6–23)
CO2: 37 mEq/L — ABNORMAL HIGH (ref 19–32)
Calcium: 9.6 mg/dL (ref 8.4–10.5)
Chloride: 96 mEq/L (ref 96–112)
Creatinine, Ser: 5.57 mg/dL (ref 0.40–1.20)
GFR: 9.84 mL/min — CL (ref >60.00)
Glucose, Bld: 185 mg/dL — ABNORMAL HIGH (ref 70–99)
Potassium: 4.4 mEq/L (ref 3.5–5.1)
Sodium: 141 mEq/L (ref 135–145)

## 2018-04-07 LAB — LIPID PANEL
Cholesterol: 201 mg/dL — ABNORMAL HIGH (ref 0–200)
HDL: 45.7 mg/dL (ref >39.00)
LDL Cholesterol: 134 mg/dL — ABNORMAL HIGH (ref 0–99)
NonHDL: 155.4
Total CHOL/HDL Ratio: 4
Triglycerides: 105 mg/dL (ref 0.0–149.0)
VLDL: 21 mg/dL (ref 0.0–40.0)

## 2018-04-07 LAB — HEPATIC FUNCTION PANEL
ALT: 9 U/L (ref 0–35)
AST: 15 U/L (ref 0–37)
Albumin: 3.9 g/dL (ref 3.5–5.2)
Alkaline Phosphatase: 89 U/L (ref 39–117)
Bilirubin, Direct: 0.1 mg/dL (ref 0.0–0.3)
Total Bilirubin: 0.4 mg/dL (ref 0.2–1.2)
Total Protein: 7.4 g/dL (ref 6.0–8.3)

## 2018-04-07 LAB — TSH: TSH: 2.71 u[IU]/mL (ref 0.35–4.50)

## 2018-04-07 LAB — HEMOGLOBIN A1C: Hgb A1c MFr Bld: 8.7 % — ABNORMAL HIGH (ref 4.6–6.5)

## 2018-04-07 MED ORDER — PANTOPRAZOLE SODIUM 40 MG PO TBEC: 40.0000 mg | DELAYED_RELEASE_TABLET | Freq: Two times a day (BID) | ORAL | 5 refills | Status: DC

## 2018-04-07 NOTE — Assessment & Plan Note (Signed)
Lab Results  Component Value Date   HGBA1C 9.3 10/07/2017  stable overall by history and exam, recent data reviewed with pt, and pt to continue medical treatment as before,  to f/u any worsening symptoms or concerns

## 2018-04-07 NOTE — Progress Notes (Signed)
Subjective:    Patient ID: Carolyn Valenzuela Comment, female    DOB: Jan 27, 1951, 67 y.o.   MRN: [id number]  HPI  Here to f/u; overall doing ok,  Pt denies chest pain, increasing sob or doe, wheezing, orthopnea, PND, increased LE swelling, palpitations, dizziness or syncope.  Pt denies new neurological symptoms such as new headache, or facial or extremity weakness or numbness.  Pt denies polydipsia, polyuria, or low sugar episode.  Pt states overall good compliance with meds, mostly trying to follow appropriate diet, with wt overall stable,  but little exercise however.  Conts HD. No new complaints Wt Readings from Last 3 Encounters:  04/07/18 140 lb (63.5 kg)  03/08/18 139 lb (63 kg)  11/18/17 138 lb (62.6 kg)  Plans to call for her own mammogram Past Medical History:  Diagnosis Date  . Allergic rhinitis, cause unspecified 12/08/2010  . ANEMIA-IRON DEFICIENCY 01/25/2008  . Aortic regurgitation   . CAD (coronary artery disease)    a. 07/2016 NSTEMI/PCI: LM nl, LAD 70ost, 27m/d, 80d, D1 40ost, LCX 99ost/p (2.5x16 Synergy DES - 2.75), 40/11m, Om2 100 CTO, RCA  30p/m. Pt eval by CT surgery, not felt to be suitable candidate 2/2 comorbidities.  . CERVICAL RADICULOPATHY, LEFT 01/25/2008  . Chronic systolic CHF (congestive heart failure) ([place])    a. 07/2016 Echo: EF 40-45%, diff HK, sev AI, mild MVP w/ mod to sev MR, PASP 30mmHg.  . Critical lower limb ischemia   . DIABETES MELLITUS, UNCONTROLLED 05/21/2009  . Esophagitis   . ESRD on dialysis ([place])   . Foot ulcer ([place])    right lateral malleolus  . HCAP (healthcare-associated pneumonia) 06/29/2016  . HYPERLIPIDEMIA 03/30/2007  . HYPERTENSION 01/25/2008  . Ischemic cardiomyopathy    a. 07/2016 Echo: EF 40-45%.  . Mitral regurgitation    a. 07/2016 Echo: Mild MVP w/ mod to sev MR.  [name] PVD (peripheral vascular disease) ([place])    a. s/p bilat BKA  . SECONDARY HYPERPARATHYROIDISM 05/21/2009  . Severe aortic regurgitation    a. 07/2016 Echo: Severe AI.    Past Surgical History:  Procedure Laterality Date  . AMPUTATION Right 03/15/2014   Procedure: RIGHT  LEG  BELOW KNEE AMPUTATION ;  Surgeon: [name], MD;  Location: [facility];  Service: Orthopedics;  Laterality: Right;  . AMPUTATION Left 11/29/2015   Procedure: AMPUTATION BELOW KNEE;  Surgeon: Newt Minion, MD;  Location: [facility];  Service: Orthopedics;  Laterality: Left;  . AV FISTULA PLACEMENT Left   . AV FISTULA PLACEMENT Left 09/06/2014   Procedure: ARTERIOVENOUS (AV) FISTULA CREATION-Left Brachiocephalic;  Surgeon: Conrad , MD;  Location: [facility];  Service: Vascular;  Laterality: Left;  . CARDIAC CATHETERIZATION N/A 06/30/2016   Procedure: Left Heart Cath and Coronary Angiography;  Surgeon: Burnell Blanks, MD;  Location: [place];  Service: Cardiovascular;  Laterality: N/A;  . CARDIAC CATHETERIZATION N/A 07/02/2016   Procedure: Coronary Stent Intervention;  Surgeon: Nelva Bush, MD;  Location: [facility] [facility];  Service: Cardiovascular;  Laterality: N/A;  . CATARACT EXTRACTION    . COLONOSCOPY    . CORONARY ANGIOPLASTY WITH STENT PLACEMENT  07/02/2016  . ESOPHAGOGASTRODUODENOSCOPY N/A 03/20/2016   Procedure: ESOPHAGOGASTRODUODENOSCOPY (EGD);  Surgeon: Milus Banister, MD;  Location: [facility];  Service: Endoscopy;  Laterality: N/A;  . EYE SURGERY Bilateral    cataracts removed, left eye still has some oil in it.  . INSERTION OF DIALYSIS CATHETER N/A 09/03/2014   Procedure: INSERTION OF DIALYSIS CATHETER  RIGHT INTERNAL JUGULAR VEIN;  Surgeon: Conrad Donna, MD;  Location: [facility];  Service: Vascular;  Laterality: N/A;  . PERIPHERAL VASCULAR CATHETERIZATION N/A 08/13/2015   Procedure: Abdominal Aortogram;  Surgeon: Elam Dutch, MD;  Location: [facility];  Service: Cardiovascular;  Laterality: N/A;    reports that she has never smoked. She has never used smokeless tobacco. She reports that she does not drink alcohol or use drugs. family history includes  Cancer in her mother. Allergies  Allergen Reactions  . Oxycodone Itching   Current Outpatient Medications on File Prior to Visit  Medication Sig Dispense Refill  . amLODipine (NORVASC) 5 MG tablet Take 1 tablet (5 mg total) by mouth daily. 90 tablet 3  . aspirin 81 MG EC tablet Take 81 mg by mouth daily.      . clopidogrel (PLAVIX) 75 MG tablet TAKE 1 TABLET BY MOUTH EVERY DAY 90 tablet 3  . famotidine (PEPCID) 20 MG tablet Take 1 tablet (20 mg total) by mouth daily. 90 tablet 3  . gabapentin (NEURONTIN) 100 MG capsule TAKE 2 CAPSULES (200 MG TOTAL) BY MOUTH 3 (THREE) TIMES DAILY. 540 capsule 1  . glipiZIDE (GLUCOTROL XL) 10 MG 24 hr tablet TAKE 1 TABLET BY MOUTH DAILY WITH BREAKFAST. 90 tablet 0  . glucose blood (ONE TOUCH ULTRA TEST) test strip Use as instructed, twice daily code E11.52 200 each 12  . lidocaine-prilocaine (EMLA) cream APPLY A SMALL AMOUNT TO SKIN 3 TIMES A WEEK OVER DIALYSIS SHUNT 30 MINUTES PRIOR TO DIALYSIS 30 g 5  . lidocaine-prilocaine (EMLA) cream APPLY A SMALL AMOUNT TO SKIN 3 TIMES A WEEK OVER DIALYSIS SHUNT 30 MINUTES PRIOR TO DIALYSIS 30 g 5  . multivitamin (RENA-VIT) TABS tablet TAKE 1 TABLET BY MOUTH EVERY DAY AT BEDTIME 30 tablet 11  . nebivolol (BYSTOLIC) 5 MG tablet Take 1 tablet (5 mg total) by mouth daily. 90 tablet 3  . ONETOUCH VERIO test strip USE TO CHECK BLOOD SUGARS TWICE A DAY DX E11.9 100 each 5  . PAZEO 0.7 % SOLN INSTILL 1 DROP INTO BOTH EYES EVERY DAY IN THE MORNING  12  . RESTASIS 0.05 % ophthalmic emulsion INSTILL 1 DROP INTO BOTH EYES EVERY 12 HOURS  4  . saccharomyces boulardii (FLORASTOR) 250 MG capsule Take 1 capsule (250 mg total) by mouth daily. 90 capsule 3  . sevelamer carbonate (RENVELA) 800 MG tablet Take 1,600 mg by mouth 3 (three) times daily with meals.    . sitaGLIPtin (JANUVIA) 50 MG tablet Take 1 tablet (50 mg total) by mouth daily. 90 tablet 3   Current Facility-Administered Medications on File Prior to Visit  Medication Dose  Route Frequency Provider Last Rate Last Dose  . Bevacizumab (AVASTIN) SOLN 1.25 mg  1.25 mg Intravitreal  Bernarda Caffey, MD   1.25 mg at 10/26/17 1520  . Bevacizumab (AVASTIN) SOLN 1.25 mg  1.25 mg Intravitreal  Bernarda Caffey, MD   1.25 mg at 12/01/17 1324  . Bevacizumab (AVASTIN) SOLN 1.25 mg  1.25 mg Intravitreal  Bernarda Caffey, MD   1.25 mg at 12/30/17 8469  . Bevacizumab (AVASTIN) SOLN 1.25 mg  1.25 mg Intravitreal  Bernarda Caffey, MD   1.25 mg at 01/27/18 1323  . triamcinolone acetonide (TRIESENCE) 40 MG/ML subtenons injection 4 mg  4 mg Intravitreal  Bernarda Caffey, MD   4 mg at 02/24/18 1122   Review of Systems  Constitutional: Negative for other unusual diaphoresis or sweats HENT: Negative for ear  discharge or swelling Eyes: Negative for other worsening visual disturbances Respiratory: Negative for stridor or other swelling  Gastrointestinal: Negative for worsening distension or other blood Genitourinary: Negative for retention or other urinary change Musculoskeletal: Negative for other MSK pain or swelling Skin: Negative for color change or other new lesions Neurological: Negative for worsening tremors and other numbness  Psychiatric/Behavioral: Negative for worsening agitation or other fatigue All other system neg per pt    Objective:   Physical Exam BP 126/78   Pulse 84   Temp 98.7 F (37.1 C) (Oral)   Ht 4\' 11"  (1.499 m)   Wt 140 lb (63.5 kg)   SpO2 96%   BMI 28.28 kg/m  VS noted,  Constitutional: Pt appears in NAD HENT: Head: NCAT.  Right Ear: External ear normal.  Left Ear: External ear normal.  Eyes: . Pupils are equal, round, and reactive to light. Conjunctivae and EOM are normal Nose: without d/c or deformity Neck: Neck supple. Gross normal ROM Cardiovascular: Normal rate and regular rhythm.   Pulmonary/Chest: Effort normal and breath sounds without rales or wheezing.  Abd:  Soft, NT, ND, + BS, no organomegaly S/p bilat bka with prostheses Neurological: Pt is  alert. At baseline orientation, motor grossly intact Skin: Skin is warm. No rashes, other new lesions, no LE edema Psychiatric: Pt behavior is normal without agitation  Lab Results  Component Value Date   WBC 6.6 04/06/2017   HGB 11.1 (L) 04/06/2017   HCT 33.5 (L) 04/06/2017   PLT 269.0 04/06/2017   GLUCOSE 191 (H) 04/06/2017   CHOL 181 04/06/2017   TRIG 214.0 (H) 04/06/2017   HDL 45.40 04/06/2017   LDLDIRECT 86.0 04/06/2017   LDLCALC 58 09/11/2016   ALT 12 04/06/2017   AST 17 04/06/2017   NA 137 04/06/2017   K 4.5 04/06/2017   CL 93 (L) 04/06/2017   CREATININE 5.95 (HH) 04/06/2017   BUN 35 (H) 04/06/2017   CO2 34 (H) 04/06/2017   TSH 2.11 09/11/2016   INR 1.06 06/30/2016   HGBA1C 9.3 10/07/2017   MICROALBUR 108.1 (H) 09/15/2016       Assessment & Plan:

## 2018-04-07 NOTE — Assessment & Plan Note (Signed)
stable overall by history and exam, recent data reviewed with pt, and pt to continue medical treatment as before,  to f/u any worsening symptoms or concerns Lab Results  Component Value Date   HGBA1C 9.3 10/07/2017

## 2018-04-07 NOTE — Assessment & Plan Note (Signed)
stable overall by history and exam, recent data reviewed with pt, and pt to continue medical treatment as before,  to f/u any worsening symptoms or concerns, for f/u labs 

## 2018-04-07 NOTE — Patient Instructions (Signed)

## 2018-04-08 ENCOUNTER — Encounter: Payer: Self-pay | Admitting: Internal Medicine

## 2018-04-08 ENCOUNTER — Encounter (INDEPENDENT_AMBULATORY_CARE_PROVIDER_SITE_OTHER): Payer: BC Managed Care – PPO | Admitting: Ophthalmology

## 2018-04-08 ENCOUNTER — Other Ambulatory Visit: Payer: Self-pay | Admitting: Internal Medicine

## 2018-04-08 DIAGNOSIS — N186 End stage renal disease: Secondary | ICD-10-CM | POA: Diagnosis not present

## 2018-04-08 DIAGNOSIS — D631 Anemia in chronic kidney disease: Secondary | ICD-10-CM | POA: Diagnosis not present

## 2018-04-08 DIAGNOSIS — E1122 Type 2 diabetes mellitus with diabetic chronic kidney disease: Secondary | ICD-10-CM | POA: Diagnosis not present

## 2018-04-08 DIAGNOSIS — E876 Hypokalemia: Secondary | ICD-10-CM | POA: Diagnosis not present

## 2018-04-08 DIAGNOSIS — N2581 Secondary hyperparathyroidism of renal origin: Secondary | ICD-10-CM | POA: Diagnosis not present

## 2018-04-08 MED ORDER — SITAGLIPTIN PHOSPHATE 100 MG PO TABS: 100.0000 mg | ORAL_TABLET | Freq: Every day | ORAL | 3 refills | Status: DC

## 2018-04-11 DIAGNOSIS — N186 End stage renal disease: Secondary | ICD-10-CM | POA: Diagnosis not present

## 2018-04-11 DIAGNOSIS — E1122 Type 2 diabetes mellitus with diabetic chronic kidney disease: Secondary | ICD-10-CM | POA: Diagnosis not present

## 2018-04-11 DIAGNOSIS — N2581 Secondary hyperparathyroidism of renal origin: Secondary | ICD-10-CM | POA: Diagnosis not present

## 2018-04-11 DIAGNOSIS — D631 Anemia in chronic kidney disease: Secondary | ICD-10-CM | POA: Diagnosis not present

## 2018-04-11 DIAGNOSIS — E876 Hypokalemia: Secondary | ICD-10-CM | POA: Diagnosis not present

## 2018-04-11 NOTE — Progress Notes (Signed)
[facility] Note  04/12/2018     CHIEF COMPLAINT Patient presents for Retina Follow Up   HISTORY OF PRESENT ILLNESS: Carolyn Valenzuela is a 67 y.o. female who presents to the clinic today for:   HPI    Retina Follow Up    Patient presents with  Diabetic Retinopathy.  In right eye.  This started months ago.  Severity is moderate.  Duration of months.  Since onset it is stable.  I, the attending physician,  performed the HPI with the patient and updated documentation appropriately.          Comments    67 y/o female pt here for 6.5 wk f/u for PDR OD.  VA OD improved some.  No change in [place] OS.  Denies pain, flashes, new floaters.  BS 121 this a.m.  A1C unknown.  Restasis QD OU Pazeo QD OU       Last edited by Bernarda Caffey, MD on 04/12/2018 12:04 PM. (History)    Pt states she did not notice any improvement in OD VA since having last injection;   Referring physician: Biagio Borg, MD [place], [place] [address]  HISTORICAL INFORMATION:   Selected notes from the MEDICAL RECORD NUMBER Referred by Dr. Shirley Muscat for concern of DME OD and hard exudates OS;  LEE- 02.21.19 (S. Bernstorf) [BCVA OD: 20/40 OS: 20/40] Ocular Hx- hx of TRD OD, S/P vitrectomy OD (03/32/10 - [facility]), CR scar OU, DME OU, PDR OU, VH OU, pseudophakia OU (04-12/2006), S/P focal laser OU (06.01.2008), S/P PRP laser (12.08.2009 - [facility], undoctumented with eye), S/p PPV OS (2014 - [facility]);  PMH- DM, HTN, elevated cholesterol, kidney disorder    CURRENT MEDICATIONS: Current Outpatient Medications (Ophthalmic Drugs)  Medication Sig  . PAZEO 0.7 % SOLN INSTILL 1 DROP INTO BOTH EYES EVERY DAY IN THE MORNING  . RESTASIS 0.05 % ophthalmic emulsion INSTILL 1 DROP INTO BOTH EYES EVERY 12 HOURS   Current Facility-Administered Medications (Ophthalmic Drugs)  Medication Route  . triamcinolone acetonide (TRIESENCE) 40 MG/ML subtenons injection 4 mg Intravitreal  .  triamcinolone acetonide (TRIESENCE) 40 MG/ML subtenons injection 4 mg Intravitreal   Current Outpatient Medications (Other)  Medication Sig  . amLODipine (NORVASC) 5 MG tablet Take 1 tablet (5 mg total) by mouth daily.  [name] aspirin 81 MG EC tablet Take 81 mg by mouth daily.    . clopidogrel (PLAVIX) 75 MG tablet TAKE 1 TABLET BY MOUTH EVERY DAY  . famotidine (PEPCID) 20 MG tablet Take 1 tablet (20 mg total) by mouth daily.  [name] gabapentin (NEURONTIN) 100 MG capsule TAKE 2 CAPSULES (200 MG TOTAL) BY MOUTH 3 (THREE) TIMES DAILY.  [name] glipiZIDE (GLUCOTROL XL) 10 MG 24 hr tablet TAKE 1 TABLET BY MOUTH DAILY WITH BREAKFAST.  [name] glucose blood (ONE TOUCH ULTRA TEST) test strip Use as instructed, twice daily code E11.52  . lidocaine-prilocaine (EMLA) cream APPLY A SMALL AMOUNT TO SKIN 3 TIMES A WEEK OVER DIALYSIS SHUNT 30 MINUTES PRIOR TO DIALYSIS  . lidocaine-prilocaine (EMLA) cream APPLY A SMALL AMOUNT TO SKIN 3 TIMES A WEEK OVER DIALYSIS SHUNT 30 MINUTES PRIOR TO DIALYSIS  . multivitamin (RENA-VIT) TABS tablet TAKE 1 TABLET BY MOUTH EVERY DAY AT BEDTIME  . nebivolol (BYSTOLIC) 5 MG tablet Take 1 tablet (5 mg total) by mouth daily.  [name] VERIO test strip USE TO CHECK BLOOD SUGARS TWICE A DAY DX E11.9  . pantoprazole (PROTONIX) 40  MG tablet Take 1 tablet (40 mg total) by mouth 2 (two) times daily.  [name] saccharomyces boulardii (FLORASTOR) 250 MG capsule Take 1 capsule (250 mg total) by mouth daily.  . sevelamer carbonate (RENVELA) 800 MG tablet Take 1,600 mg by mouth 3 (three) times daily with meals.  . sitaGLIPtin (JANUVIA) 100 MG tablet Take 1 tablet (100 mg total) by mouth daily.   Current Facility-Administered Medications (Other)  Medication Route  . Bevacizumab (AVASTIN) SOLN 1.25 mg Intravitreal  . Bevacizumab (AVASTIN) SOLN 1.25 mg Intravitreal  . Bevacizumab (AVASTIN) SOLN 1.25 mg Intravitreal  . Bevacizumab (AVASTIN) SOLN 1.25 mg Intravitreal      REVIEW OF SYSTEMS: ROS    Positive  for: Endocrine, Eyes   Negative for: Constitutional, Gastrointestinal, Neurological, Skin, Genitourinary, Musculoskeletal, HENT, Cardiovascular, Respiratory, Psychiatric, Allergic/Imm, Heme/Lymph   Last edited by Matthew Folks, COA on 04/12/2018 10:05 AM. (History)       ALLERGIES Allergies  Allergen Reactions  . Oxycodone Itching    PAST MEDICAL HISTORY Past Medical History:  Diagnosis Date  . Allergic rhinitis, cause unspecified 12/08/2010  . ANEMIA-IRON DEFICIENCY 01/25/2008  . Aortic regurgitation   . CAD (coronary artery disease)    a. 07/2016 NSTEMI/PCI: LM nl, LAD 70ost, 52m/d, 80d, D1 40ost, LCX 99ost/p (2.5x16 Synergy DES - 2.75), 40/32m, Om2 100 CTO, RCA  30p/m. Pt eval by CT surgery, not felt to be suitable candidate 2/2 comorbidities.  . CERVICAL RADICULOPATHY, LEFT 01/25/2008  . Chronic systolic CHF (congestive heart failure) ([place])    a. 07/2016 Echo: EF 40-45%, diff HK, sev AI, mild MVP w/ mod to sev MR, PASP 51mmHg.  . Critical lower limb ischemia   . DIABETES MELLITUS, UNCONTROLLED 05/21/2009  . Esophagitis   . ESRD on dialysis ([place])   . Foot ulcer ([place])    right lateral malleolus  . HCAP (healthcare-associated pneumonia) 06/29/2016  . HYPERLIPIDEMIA 03/30/2007  . HYPERTENSION 01/25/2008  . Ischemic cardiomyopathy    a. 07/2016 Echo: EF 40-45%.  . Mitral regurgitation    a. 07/2016 Echo: Mild MVP w/ mod to sev MR.  [name] PVD (peripheral vascular disease) ([place])    a. s/p bilat BKA  . SECONDARY HYPERPARATHYROIDISM 05/21/2009  . Severe aortic regurgitation    a. 07/2016 Echo: Severe AI.   Past Surgical History:  Procedure Laterality Date  . AMPUTATION Right 03/15/2014   Procedure: RIGHT  LEG  BELOW KNEE AMPUTATION ;  Surgeon: [name], MD;  Location: [facility];  Service: Orthopedics;  Laterality: Right;  . AMPUTATION Left 11/29/2015   Procedure: AMPUTATION BELOW KNEE;  Surgeon: Newt Minion, MD;  Location: [facility];  Service: Orthopedics;  Laterality: Left;  . AV  FISTULA PLACEMENT Left   . AV FISTULA PLACEMENT Left 09/06/2014   Procedure: ARTERIOVENOUS (AV) FISTULA CREATION-Left Brachiocephalic;  Surgeon: Conrad Kim, MD;  Location: [facility];  Service: Vascular;  Laterality: Left;  . CARDIAC CATHETERIZATION N/A 06/30/2016   Procedure: Left Heart Cath and Coronary Angiography;  Surgeon: Burnell Blanks, MD;  Location: [facility];  Service: Cardiovascular;  Laterality: N/A;  . CARDIAC CATHETERIZATION N/A 07/02/2016   Procedure: Coronary Stent Intervention;  Surgeon: Nelva Bush, MD;  Location: [facility];  Service: Cardiovascular;  Laterality: N/A;  . CATARACT EXTRACTION    . COLONOSCOPY    . CORONARY ANGIOPLASTY WITH STENT PLACEMENT  07/02/2016  . ESOPHAGOGASTRODUODENOSCOPY N/A 03/20/2016   Procedure: ESOPHAGOGASTRODUODENOSCOPY (EGD);  Surgeon: Milus Banister, MD;  Location: [facility];  Service: Endoscopy;  Laterality: N/A;  . EYE SURGERY Bilateral    cataracts removed, left eye still has some oil in it.  . INSERTION OF DIALYSIS CATHETER N/A 09/03/2014   Procedure: INSERTION OF DIALYSIS CATHETER RIGHT INTERNAL JUGULAR VEIN;  Surgeon: Conrad Maunabo, MD;  Location: [facility];  Service: Vascular;  Laterality: N/A;  . PERIPHERAL VASCULAR CATHETERIZATION N/A 08/13/2015   Procedure: Abdominal Aortogram;  Surgeon: Elam Dutch, MD;  Location: [facility];  Service: Cardiovascular;  Laterality: N/A;    FAMILY HISTORY Family History  Problem Relation Age of Onset  . Cancer Mother        Pancreatic    SOCIAL HISTORY Social History   Tobacco Use  . Smoking status: Never Smoker  . Smokeless tobacco: Never Used  Substance Use Topics  . Alcohol use: No    Alcohol/week: 0.0 standard drinks  . Drug use: No         OPHTHALMIC EXAM:  Base Eye Exam    Visual Acuity (Snellen - Linear)      Right Left   Dist cc 20/50 - 20/40 -2   Dist ph cc NI NI   Correction:  Glasses       Tonometry (Tonopen, 10:17 AM)      Right  Left   Pressure 10 11       Pupils      Dark Light Shape React APD   Right 4 3 Round Brisk None   Left 4 3 Round Brisk None       Visual Fields (Counting fingers)      Left Right    Full Full       Extraocular Movement      Right Left    Full, Ortho Full, Ortho       Neuro/Psych    Oriented x3:  Yes   Mood/Affect:  Normal       Dilation    Both eyes:  1.0% Mydriacyl, 2.5% Phenylephrine @ 10:17 AM        Slit Lamp and Fundus Exam    Slit Lamp Exam      Right Left   Lids/Lashes dermatochalasis dermatochalasis   Conjunctiva/Sclera White and quiet White and quiet   Cornea arcus; 1+ PEE; 2+ guttae arcus; 1-2+ guttae   Anterior Chamber Deep and quiet Deep and quiet   Iris Round, dilated; no NVI Round, dilated; large PI at 0600 no NVI   Lens PCIOL in good position PCIOL in good position   Vitreous clear clear       Fundus Exam      Right Left   Disc Mild Pallor Mild Pallor   C/D Ratio 0.3 0.35   Macula Scattered Mas -- imporved, dot hemorrhages -- improved, exudates -- improved, blunted foveal reflex with positive edema, Inferior Epiretinal membrane Blunted foveal reflex, scattered Exudates, Retinal pigment epithelial mottling, focal laser scars   Vessels Vascular attenuation, sclerotic arterioles sclerotic arterioles, generalized attenuation   Periphery 360 PRP, scattered areas of fibrosis 360 PRP in place, areas of fibrosis          IMAGING AND PROCEDURES  Imaging and Procedures for 12/30/17  OCT, Retina - OU - Both Eyes       Right Eye Quality was good. Central Foveal Thickness: 276. Progression has improved. Findings include abnormal foveal contour, no SRF, intraretinal fluid, epiretinal membrane, intraretinal hyper-reflective material, outer retinal atrophy, inner retinal atrophy (Interval improvement in IRF).   Left Eye Quality was  borderline. Central Foveal Thickness: 280. Progression has been stable. Findings include abnormal foveal contour, no IRF,  no SRF, macular pucker, epiretinal membrane, outer retinal atrophy (Rare cystic change temporally).   Notes *Images captured and stored on drive  Diagnosis / Impression:  OD: DME; ERM - interval improvement in IRF OS: no DME; +ERM w/ some pucker and preretinal fibrosis   Clinical management:  See below  Abbreviations: NFP - Normal foveal profile. CME - cystoid macular edema. PED - pigment epithelial detachment. IRF - intraretinal fluid. SRF - subretinal fluid. EZ - ellipsoid zone. ERM - epiretinal membrane. ORA - outer retinal atrophy. ORT - outer retinal tubulation. SRHM - subretinal hyper-reflective material         Intravitreal Injection, Pharmacologic Agent - OD - Right Eye       Time Out 04/12/2018. 12:21 PM. Confirmed correct patient, procedure, site, and patient consented.   Anesthesia Topical anesthesia was used. Anesthetic medications included Lidocaine 2%, Tetracaine 0.5%.   Procedure Preparation included eyelid speculum, 5% betadine to ocular surface. A 27 gauge needle was used.   Injection:  4 mg triamcinolone acetonide 40 MG/ML   NDC: 9562-1308-65, Lot: [id number], Expiration date: 07/31/2019   Route: Intravitreal, Site: Right Eye, Waste: 0.9 mL  Post-op Post injection exam found visual acuity of at least counting fingers. The patient tolerated the procedure well. There were no complications. The patient received written and verbal post procedure care education.                 ASSESSMENT/PLAN:    ICD-10-CM   1. Proliferative diabetic retinopathy of right eye with macular edema associated with type 2 diabetes mellitus (HCC) E11.3511 OCT, Retina - OU - Both Eyes    Intravitreal Injection, Pharmacologic Agent - OD - Right Eye    triamcinolone acetonide (TRIESENCE) 40 MG/ML subtenons injection 4 mg  2. Stable proliferative diabetic retinopathy of left eye associated with type 2 diabetes mellitus ([place]) E95.2841   3. Retinal edema H35.81 OCT, Retina - OU -  Both Eyes  4. Pseudophakia of both eyes Z96.1   5. Corneal guttata H18.51     1,2,3. Proliferative diabetic retinopathy OU- - s/p PPV OU with [facility] in 2014 - s/p PRP OU with [facility] - S/P IVA OD #1 (02.26.19), #2 (04.02.19), #3 (05.02.19), #4 (05.30.19) - S/P IVTA #1 OD (06.27.19) -- switched due to minimal improvement on IVA - exam shows good PRP 360 OU; sclerotic arterioles with sheathing OU; scattered DBH OU - BCVA stable at 20/50 - OCT shows improved but persistent diabetic macular edema OD - IOP remains 10 mmHg, without spike OD - recommend IVTA #2 OD today, (08.13.19) - pt wishes to proceed - RBA of procedure discussed, questions answered - informed consent obtained and signed - see procedure note  - f/u in 6 wks  4. Pseudophakia OU  - s/p CE/IOL OU by Dr. Elliot Dally  - beautiful surgery, doing well  - monitor  5. Corneal guttata OU - no edema - monitor   Ophthalmic Meds Ordered this visit:  Meds ordered this encounter  Medications  . triamcinolone acetonide (TRIESENCE) 40 MG/ML subtenons injection 4 mg       Return in about 6 weeks (around 05/24/2018) for F/U PDR OU, DFE OCT.  There are no Patient Instructions on file for this visit.   Explained the diagnoses, plan, and follow up with the patient and they expressed understanding.  Patient expressed understanding of the importance of proper follow up care.  This document serves as a record of services personally performed by Gardiner Sleeper, MD, PhD. It was created on their behalf by Ernest Mallick, OA, an ophthalmic assistant. The creation of this record is the provider's dictation and/or activities during the visit.    Electronically signed by: Ernest Mallick, OA  08.12.2019 10:51 PM     Gardiner Sleeper, M.D., Ph.D. Diseases & Surgery of the Retina and Vitreous [facility]   I have reviewed the above documentation for accuracy and completeness, and I agree with the above. Gardiner Sleeper, M.D., Ph.D. 04/12/18 10:51 PM    Abbreviations: M myopia (nearsighted); A astigmatism; H hyperopia (farsighted); P presbyopia; Mrx spectacle prescription;  CTL contact lenses; OD right eye; OS left eye; OU both eyes  XT exotropia; ET esotropia; PEK punctate epithelial keratitis; PEE punctate epithelial erosions; DES dry eye syndrome; MGD meibomian gland dysfunction; ATs artificial tears; PFAT's preservative free artificial tears; [place] nuclear sclerotic cataract; PSC posterior subcapsular cataract; ERM epi-retinal membrane; PVD posterior vitreous detachment; RD retinal detachment; DM diabetes mellitus; DR diabetic retinopathy; NPDR non-proliferative diabetic retinopathy; PDR proliferative diabetic retinopathy; CSME clinically significant macular edema; DME diabetic macular edema; dbh dot blot hemorrhages; CWS cotton wool spot; POAG primary open angle glaucoma; C/D cup-to-disc ratio; HVF humphrey visual field; GVF goldmann visual field; OCT optical coherence tomography; IOP intraocular pressure; BRVO Branch retinal vein occlusion; CRVO central retinal vein occlusion; CRAO central retinal artery occlusion; BRAO branch retinal artery occlusion; RT retinal tear; SB scleral buckle; PPV pars plana vitrectomy; VH Vitreous hemorrhage; PRP panretinal laser photocoagulation; IVK intravitreal kenalog; VMT vitreomacular traction; MH Macular hole;  NVD neovascularization of the disc; NVE neovascularization elsewhere; AREDS age related eye disease study; ARMD age related macular degeneration; POAG primary open angle glaucoma; EBMD epithelial/anterior basement membrane dystrophy; ACIOL anterior chamber intraocular lens; IOL intraocular lens; PCIOL posterior chamber intraocular lens; Phaco/IOL phacoemulsification with intraocular lens placement; [place] photorefractive keratectomy; LASIK laser assisted in situ keratomileusis; HTN hypertension; DM diabetes mellitus; COPD chronic obstructive pulmonary disease

## 2018-04-12 ENCOUNTER — Ambulatory Visit (INDEPENDENT_AMBULATORY_CARE_PROVIDER_SITE_OTHER): Payer: BC Managed Care – PPO | Admitting: Ophthalmology

## 2018-04-12 ENCOUNTER — Encounter (INDEPENDENT_AMBULATORY_CARE_PROVIDER_SITE_OTHER): Payer: Self-pay | Admitting: Ophthalmology

## 2018-04-12 DIAGNOSIS — H3581 Retinal edema: Secondary | ICD-10-CM

## 2018-04-12 DIAGNOSIS — H1851 Endothelial corneal dystrophy: Secondary | ICD-10-CM | POA: Diagnosis not present

## 2018-04-12 DIAGNOSIS — Z961 Presence of intraocular lens: Secondary | ICD-10-CM | POA: Diagnosis not present

## 2018-04-12 DIAGNOSIS — E113552 Type 2 diabetes mellitus with stable proliferative diabetic retinopathy, left eye: Secondary | ICD-10-CM

## 2018-04-12 DIAGNOSIS — E113511 Type 2 diabetes mellitus with proliferative diabetic retinopathy with macular edema, right eye: Secondary | ICD-10-CM

## 2018-04-12 DIAGNOSIS — H18519 Endothelial corneal dystrophy, unspecified eye: Secondary | ICD-10-CM

## 2018-04-12 MED ORDER — TRIAMCINOLONE ACETONIDE 40 MG/ML IO SUSP
4.0000 mg | INTRAOCULAR | Status: DC
  Administered 2018-04-12: 4 mg via INTRAVITREAL

## 2018-04-13 DIAGNOSIS — N186 End stage renal disease: Secondary | ICD-10-CM | POA: Diagnosis not present

## 2018-04-13 DIAGNOSIS — N2581 Secondary hyperparathyroidism of renal origin: Secondary | ICD-10-CM | POA: Diagnosis not present

## 2018-04-13 DIAGNOSIS — E1122 Type 2 diabetes mellitus with diabetic chronic kidney disease: Secondary | ICD-10-CM | POA: Diagnosis not present

## 2018-04-13 DIAGNOSIS — E876 Hypokalemia: Secondary | ICD-10-CM | POA: Diagnosis not present

## 2018-04-13 DIAGNOSIS — D631 Anemia in chronic kidney disease: Secondary | ICD-10-CM | POA: Diagnosis not present

## 2018-04-14 ENCOUNTER — Ambulatory Visit: Payer: Medicare Other | Admitting: Physician Assistant

## 2018-04-14 ENCOUNTER — Telehealth: Payer: Self-pay | Admitting: Internal Medicine

## 2018-04-14 NOTE — Telephone Encounter (Signed)
Copied from [place] [id number]. Topic: Quick Communication - See Telephone Encounter >> Apr 14, 2018  2:26 PM Rutherford Nail, [place] wrote: CRM for notification. See Telephone encounter for: 04/14/18. Patient calling and states that the pharmacy called and said that they had a prescription ready for her for sitaGLIPtin (JANUVIA) 100 MG tablet. Patient states that she is only on 50mg  per day. Patient would like a call back for clarification on which mg she is to take. Please advise.

## 2018-04-15 DIAGNOSIS — D631 Anemia in chronic kidney disease: Secondary | ICD-10-CM | POA: Diagnosis not present

## 2018-04-15 DIAGNOSIS — N186 End stage renal disease: Secondary | ICD-10-CM | POA: Diagnosis not present

## 2018-04-15 DIAGNOSIS — N2581 Secondary hyperparathyroidism of renal origin: Secondary | ICD-10-CM | POA: Diagnosis not present

## 2018-04-15 DIAGNOSIS — E876 Hypokalemia: Secondary | ICD-10-CM | POA: Diagnosis not present

## 2018-04-15 DIAGNOSIS — E1122 Type 2 diabetes mellitus with diabetic chronic kidney disease: Secondary | ICD-10-CM | POA: Diagnosis not present

## 2018-04-18 DIAGNOSIS — N186 End stage renal disease: Secondary | ICD-10-CM | POA: Diagnosis not present

## 2018-04-18 DIAGNOSIS — E876 Hypokalemia: Secondary | ICD-10-CM | POA: Diagnosis not present

## 2018-04-18 DIAGNOSIS — E1122 Type 2 diabetes mellitus with diabetic chronic kidney disease: Secondary | ICD-10-CM | POA: Diagnosis not present

## 2018-04-18 DIAGNOSIS — N2581 Secondary hyperparathyroidism of renal origin: Secondary | ICD-10-CM | POA: Diagnosis not present

## 2018-04-18 DIAGNOSIS — D631 Anemia in chronic kidney disease: Secondary | ICD-10-CM | POA: Diagnosis not present

## 2018-04-18 NOTE — Telephone Encounter (Signed)
Called pt to inform her of below details, no VM, phone continuously rings.

## 2018-04-18 NOTE — Telephone Encounter (Signed)
The [place] was increased to 100 mg about Aug 9, after the a1c was found to be high  Patient was supposed to be informed of this by phone at the time; perhaps she was not avialable?  OK to cont the januvia 100 mg

## 2018-04-18 NOTE — Telephone Encounter (Signed)
I looked in her past medications and I did see where she was taking 50mg  prior to the 100mg  sent in on 04/08/18. Please advise.

## 2018-04-20 DIAGNOSIS — D631 Anemia in chronic kidney disease: Secondary | ICD-10-CM | POA: Diagnosis not present

## 2018-04-20 DIAGNOSIS — E876 Hypokalemia: Secondary | ICD-10-CM | POA: Diagnosis not present

## 2018-04-20 DIAGNOSIS — E1122 Type 2 diabetes mellitus with diabetic chronic kidney disease: Secondary | ICD-10-CM | POA: Diagnosis not present

## 2018-04-20 DIAGNOSIS — N186 End stage renal disease: Secondary | ICD-10-CM | POA: Diagnosis not present

## 2018-04-20 DIAGNOSIS — N2581 Secondary hyperparathyroidism of renal origin: Secondary | ICD-10-CM | POA: Diagnosis not present

## 2018-04-22 DIAGNOSIS — N186 End stage renal disease: Secondary | ICD-10-CM | POA: Diagnosis not present

## 2018-04-22 DIAGNOSIS — D631 Anemia in chronic kidney disease: Secondary | ICD-10-CM | POA: Diagnosis not present

## 2018-04-22 DIAGNOSIS — E876 Hypokalemia: Secondary | ICD-10-CM | POA: Diagnosis not present

## 2018-04-22 DIAGNOSIS — N2581 Secondary hyperparathyroidism of renal origin: Secondary | ICD-10-CM | POA: Diagnosis not present

## 2018-04-22 DIAGNOSIS — E1122 Type 2 diabetes mellitus with diabetic chronic kidney disease: Secondary | ICD-10-CM | POA: Diagnosis not present

## 2018-04-25 DIAGNOSIS — N2581 Secondary hyperparathyroidism of renal origin: Secondary | ICD-10-CM | POA: Diagnosis not present

## 2018-04-25 DIAGNOSIS — N186 End stage renal disease: Secondary | ICD-10-CM | POA: Diagnosis not present

## 2018-04-25 DIAGNOSIS — E876 Hypokalemia: Secondary | ICD-10-CM | POA: Diagnosis not present

## 2018-04-25 DIAGNOSIS — D631 Anemia in chronic kidney disease: Secondary | ICD-10-CM | POA: Diagnosis not present

## 2018-04-25 DIAGNOSIS — E1122 Type 2 diabetes mellitus with diabetic chronic kidney disease: Secondary | ICD-10-CM | POA: Diagnosis not present

## 2018-04-27 DIAGNOSIS — N186 End stage renal disease: Secondary | ICD-10-CM | POA: Diagnosis not present

## 2018-04-27 DIAGNOSIS — D631 Anemia in chronic kidney disease: Secondary | ICD-10-CM | POA: Diagnosis not present

## 2018-04-27 DIAGNOSIS — E1122 Type 2 diabetes mellitus with diabetic chronic kidney disease: Secondary | ICD-10-CM | POA: Diagnosis not present

## 2018-04-27 DIAGNOSIS — N2581 Secondary hyperparathyroidism of renal origin: Secondary | ICD-10-CM | POA: Diagnosis not present

## 2018-04-27 DIAGNOSIS — E876 Hypokalemia: Secondary | ICD-10-CM | POA: Diagnosis not present

## 2018-04-28 DIAGNOSIS — Z992 Dependence on renal dialysis: Secondary | ICD-10-CM | POA: Diagnosis not present

## 2018-04-28 DIAGNOSIS — N186 End stage renal disease: Secondary | ICD-10-CM | POA: Diagnosis not present

## 2018-04-28 DIAGNOSIS — T82858A Stenosis of vascular prosthetic devices, implants and grafts, initial encounter: Secondary | ICD-10-CM | POA: Diagnosis not present

## 2018-04-28 DIAGNOSIS — I871 Compression of vein: Secondary | ICD-10-CM | POA: Diagnosis not present

## 2018-04-29 DIAGNOSIS — E876 Hypokalemia: Secondary | ICD-10-CM | POA: Diagnosis not present

## 2018-04-29 DIAGNOSIS — D631 Anemia in chronic kidney disease: Secondary | ICD-10-CM | POA: Diagnosis not present

## 2018-04-29 DIAGNOSIS — N2581 Secondary hyperparathyroidism of renal origin: Secondary | ICD-10-CM | POA: Diagnosis not present

## 2018-04-29 DIAGNOSIS — N186 End stage renal disease: Secondary | ICD-10-CM | POA: Diagnosis not present

## 2018-04-29 DIAGNOSIS — E1122 Type 2 diabetes mellitus with diabetic chronic kidney disease: Secondary | ICD-10-CM | POA: Diagnosis not present

## 2018-05-01 DIAGNOSIS — N186 End stage renal disease: Secondary | ICD-10-CM | POA: Diagnosis not present

## 2018-05-01 DIAGNOSIS — E1129 Type 2 diabetes mellitus with other diabetic kidney complication: Secondary | ICD-10-CM | POA: Diagnosis not present

## 2018-05-01 DIAGNOSIS — Z992 Dependence on renal dialysis: Secondary | ICD-10-CM | POA: Diagnosis not present

## 2018-05-02 DIAGNOSIS — E1122 Type 2 diabetes mellitus with diabetic chronic kidney disease: Secondary | ICD-10-CM | POA: Diagnosis not present

## 2018-05-02 DIAGNOSIS — E876 Hypokalemia: Secondary | ICD-10-CM | POA: Diagnosis not present

## 2018-05-02 DIAGNOSIS — N186 End stage renal disease: Secondary | ICD-10-CM | POA: Diagnosis not present

## 2018-05-02 DIAGNOSIS — Z23 Encounter for immunization: Secondary | ICD-10-CM | POA: Diagnosis not present

## 2018-05-02 DIAGNOSIS — N2581 Secondary hyperparathyroidism of renal origin: Secondary | ICD-10-CM | POA: Diagnosis not present

## 2018-05-02 DIAGNOSIS — D631 Anemia in chronic kidney disease: Secondary | ICD-10-CM | POA: Diagnosis not present

## 2018-05-03 ENCOUNTER — Telehealth: Payer: Self-pay | Admitting: Internal Medicine

## 2018-05-03 NOTE — Telephone Encounter (Signed)
Patient called and asked for clarification on what she was requesting. She says she was taking Januvia 50 mg and was called to say she has 100 mg pills prescribed and would like to know what to take. I advised that on 04/08/18 it was noted by Dr. Jenny Reichmann the A1C was elevated and to increase the Januvia to 100 mg daily and a prescription was sent at that time, she verbalized understanding.

## 2018-05-03 NOTE — Telephone Encounter (Signed)
Copied from [place] [id number]. Topic: Quick Communication - Rx Refill/Question >> May 03, 2018  9:33 AM [name] Olp L wrote: Medication: Januvia (needs to clarify dosage for refill, 5 pills)  Has the patient contacted their pharmacy? Yes.   (Agent: If no, request that the patient contact the pharmacy for the refill.) (Agent: If yes, when and what did the pharmacy advise?)  Preferred Pharmacy (with phone number or street name): [id number] [address], [place] [address] Phone: [phone] Fax: [phone]  Agent: Please be advised that RX refills may take up to 3 business days. We ask that you follow-up with your pharmacy.

## 2018-05-04 DIAGNOSIS — D631 Anemia in chronic kidney disease: Secondary | ICD-10-CM | POA: Diagnosis not present

## 2018-05-04 DIAGNOSIS — Z23 Encounter for immunization: Secondary | ICD-10-CM | POA: Diagnosis not present

## 2018-05-04 DIAGNOSIS — E1122 Type 2 diabetes mellitus with diabetic chronic kidney disease: Secondary | ICD-10-CM | POA: Diagnosis not present

## 2018-05-04 DIAGNOSIS — E876 Hypokalemia: Secondary | ICD-10-CM | POA: Diagnosis not present

## 2018-05-04 DIAGNOSIS — N186 End stage renal disease: Secondary | ICD-10-CM | POA: Diagnosis not present

## 2018-05-04 DIAGNOSIS — N2581 Secondary hyperparathyroidism of renal origin: Secondary | ICD-10-CM | POA: Diagnosis not present

## 2018-05-06 DIAGNOSIS — E1122 Type 2 diabetes mellitus with diabetic chronic kidney disease: Secondary | ICD-10-CM | POA: Diagnosis not present

## 2018-05-06 DIAGNOSIS — D631 Anemia in chronic kidney disease: Secondary | ICD-10-CM | POA: Diagnosis not present

## 2018-05-06 DIAGNOSIS — Z23 Encounter for immunization: Secondary | ICD-10-CM | POA: Diagnosis not present

## 2018-05-06 DIAGNOSIS — N186 End stage renal disease: Secondary | ICD-10-CM | POA: Diagnosis not present

## 2018-05-06 DIAGNOSIS — N2581 Secondary hyperparathyroidism of renal origin: Secondary | ICD-10-CM | POA: Diagnosis not present

## 2018-05-06 DIAGNOSIS — E876 Hypokalemia: Secondary | ICD-10-CM | POA: Diagnosis not present

## 2018-05-09 DIAGNOSIS — E1122 Type 2 diabetes mellitus with diabetic chronic kidney disease: Secondary | ICD-10-CM | POA: Diagnosis not present

## 2018-05-09 DIAGNOSIS — Z23 Encounter for immunization: Secondary | ICD-10-CM | POA: Diagnosis not present

## 2018-05-09 DIAGNOSIS — N2581 Secondary hyperparathyroidism of renal origin: Secondary | ICD-10-CM | POA: Diagnosis not present

## 2018-05-09 DIAGNOSIS — E876 Hypokalemia: Secondary | ICD-10-CM | POA: Diagnosis not present

## 2018-05-09 DIAGNOSIS — D631 Anemia in chronic kidney disease: Secondary | ICD-10-CM | POA: Diagnosis not present

## 2018-05-09 DIAGNOSIS — N186 End stage renal disease: Secondary | ICD-10-CM | POA: Diagnosis not present

## 2018-05-11 DIAGNOSIS — D631 Anemia in chronic kidney disease: Secondary | ICD-10-CM | POA: Diagnosis not present

## 2018-05-11 DIAGNOSIS — E1122 Type 2 diabetes mellitus with diabetic chronic kidney disease: Secondary | ICD-10-CM | POA: Diagnosis not present

## 2018-05-11 DIAGNOSIS — E876 Hypokalemia: Secondary | ICD-10-CM | POA: Diagnosis not present

## 2018-05-11 DIAGNOSIS — N186 End stage renal disease: Secondary | ICD-10-CM | POA: Diagnosis not present

## 2018-05-11 DIAGNOSIS — N2581 Secondary hyperparathyroidism of renal origin: Secondary | ICD-10-CM | POA: Diagnosis not present

## 2018-05-11 DIAGNOSIS — Z23 Encounter for immunization: Secondary | ICD-10-CM | POA: Diagnosis not present

## 2018-05-13 DIAGNOSIS — N186 End stage renal disease: Secondary | ICD-10-CM | POA: Diagnosis not present

## 2018-05-13 DIAGNOSIS — E1122 Type 2 diabetes mellitus with diabetic chronic kidney disease: Secondary | ICD-10-CM | POA: Diagnosis not present

## 2018-05-13 DIAGNOSIS — D631 Anemia in chronic kidney disease: Secondary | ICD-10-CM | POA: Diagnosis not present

## 2018-05-13 DIAGNOSIS — Z23 Encounter for immunization: Secondary | ICD-10-CM | POA: Diagnosis not present

## 2018-05-13 DIAGNOSIS — E876 Hypokalemia: Secondary | ICD-10-CM | POA: Diagnosis not present

## 2018-05-13 DIAGNOSIS — N2581 Secondary hyperparathyroidism of renal origin: Secondary | ICD-10-CM | POA: Diagnosis not present

## 2018-05-16 DIAGNOSIS — Z23 Encounter for immunization: Secondary | ICD-10-CM | POA: Diagnosis not present

## 2018-05-16 DIAGNOSIS — D631 Anemia in chronic kidney disease: Secondary | ICD-10-CM | POA: Diagnosis not present

## 2018-05-16 DIAGNOSIS — N2581 Secondary hyperparathyroidism of renal origin: Secondary | ICD-10-CM | POA: Diagnosis not present

## 2018-05-16 DIAGNOSIS — E876 Hypokalemia: Secondary | ICD-10-CM | POA: Diagnosis not present

## 2018-05-16 DIAGNOSIS — E1122 Type 2 diabetes mellitus with diabetic chronic kidney disease: Secondary | ICD-10-CM | POA: Diagnosis not present

## 2018-05-16 DIAGNOSIS — N186 End stage renal disease: Secondary | ICD-10-CM | POA: Diagnosis not present

## 2018-05-18 DIAGNOSIS — E876 Hypokalemia: Secondary | ICD-10-CM | POA: Diagnosis not present

## 2018-05-18 DIAGNOSIS — E1122 Type 2 diabetes mellitus with diabetic chronic kidney disease: Secondary | ICD-10-CM | POA: Diagnosis not present

## 2018-05-18 DIAGNOSIS — N186 End stage renal disease: Secondary | ICD-10-CM | POA: Diagnosis not present

## 2018-05-18 DIAGNOSIS — D631 Anemia in chronic kidney disease: Secondary | ICD-10-CM | POA: Diagnosis not present

## 2018-05-18 DIAGNOSIS — N2581 Secondary hyperparathyroidism of renal origin: Secondary | ICD-10-CM | POA: Diagnosis not present

## 2018-05-18 DIAGNOSIS — Z23 Encounter for immunization: Secondary | ICD-10-CM | POA: Diagnosis not present

## 2018-05-20 DIAGNOSIS — N186 End stage renal disease: Secondary | ICD-10-CM | POA: Diagnosis not present

## 2018-05-20 DIAGNOSIS — E876 Hypokalemia: Secondary | ICD-10-CM | POA: Diagnosis not present

## 2018-05-20 DIAGNOSIS — E1122 Type 2 diabetes mellitus with diabetic chronic kidney disease: Secondary | ICD-10-CM | POA: Diagnosis not present

## 2018-05-20 DIAGNOSIS — Z23 Encounter for immunization: Secondary | ICD-10-CM | POA: Diagnosis not present

## 2018-05-20 DIAGNOSIS — N2581 Secondary hyperparathyroidism of renal origin: Secondary | ICD-10-CM | POA: Diagnosis not present

## 2018-05-20 DIAGNOSIS — D631 Anemia in chronic kidney disease: Secondary | ICD-10-CM | POA: Diagnosis not present

## 2018-05-23 DIAGNOSIS — N2581 Secondary hyperparathyroidism of renal origin: Secondary | ICD-10-CM | POA: Diagnosis not present

## 2018-05-23 DIAGNOSIS — E1122 Type 2 diabetes mellitus with diabetic chronic kidney disease: Secondary | ICD-10-CM | POA: Diagnosis not present

## 2018-05-23 DIAGNOSIS — Z23 Encounter for immunization: Secondary | ICD-10-CM | POA: Diagnosis not present

## 2018-05-23 DIAGNOSIS — D631 Anemia in chronic kidney disease: Secondary | ICD-10-CM | POA: Diagnosis not present

## 2018-05-23 DIAGNOSIS — N186 End stage renal disease: Secondary | ICD-10-CM | POA: Diagnosis not present

## 2018-05-23 DIAGNOSIS — E876 Hypokalemia: Secondary | ICD-10-CM | POA: Diagnosis not present

## 2018-05-23 NOTE — Progress Notes (Signed)
[facility] Note  05/24/2018     CHIEF COMPLAINT Patient presents for Retina Follow Up   HISTORY OF PRESENT ILLNESS: Carolyn Valenzuela is a 67 y.o. female who presents to the clinic today for:   HPI    Retina Follow Up    Patient presents with  Diabetic Retinopathy.  In right eye.  Severity is moderate.  Duration of 6 weeks.  Since onset it is stable.  I, the attending physician,  performed the HPI with the patient and updated documentation appropriately.          Comments    Pt presents for PDR OD f/u, pt states that vision is not doing that good, she states that it's gotten worse since she was here last, pt states she has floaters OS every now and then, but denies FOL, pain or wavy vision, pt is using Pazeo and Restasis, pt states one of her diabetes meds has been raised from 50mg -100mg , but she cannot remember which one       Last edited by Bernarda Caffey, MD on 05/24/2018 11:34 AM. (History)      Referring physician: Biagio Borg, MD [place], [place] [address]  HISTORICAL INFORMATION:   Selected notes from the MEDICAL RECORD NUMBER Referred by Dr. Shirley Muscat for concern of DME OD and hard exudates OS;  LEE- 02.21.19 (S. Bernstorf) [BCVA OD: 20/40 OS: 20/40] Ocular Hx- hx of TRD OD, S/P vitrectomy OD (03/32/10 - [facility]), CR scar OU, DME OU, PDR OU, VH OU, pseudophakia OU (04-12/2006), S/P focal laser OU (06.01.2008), S/P PRP laser (12.08.2009 - [facility], undoctumented with eye), S/p PPV OS (2014 - [facility]);  PMH- DM, HTN, elevated cholesterol, kidney disorder    CURRENT MEDICATIONS: Current Outpatient Medications (Ophthalmic Drugs)  Medication Sig  . PAZEO 0.7 % SOLN INSTILL 1 DROP INTO BOTH EYES EVERY DAY IN THE MORNING  . RESTASIS 0.05 % ophthalmic emulsion INSTILL 1 DROP INTO BOTH EYES EVERY 12 HOURS   Current Facility-Administered Medications (Ophthalmic Drugs)  Medication Route  . aflibercept (EYLEA) SOLN 2 mg Intravitreal   . triamcinolone acetonide (TRIESENCE) 40 MG/ML subtenons injection 4 mg Intravitreal  . triamcinolone acetonide (TRIESENCE) 40 MG/ML subtenons injection 4 mg Intravitreal   Current Outpatient Medications (Other)  Medication Sig  . amLODipine (NORVASC) 5 MG tablet Take 1 tablet (5 mg total) by mouth daily.  [name] aspirin 81 MG EC tablet Take 81 mg by mouth daily.    . clopidogrel (PLAVIX) 75 MG tablet TAKE 1 TABLET BY MOUTH EVERY DAY  . famotidine (PEPCID) 20 MG tablet Take 1 tablet (20 mg total) by mouth daily.  [name] gabapentin (NEURONTIN) 100 MG capsule TAKE 2 CAPSULES (200 MG TOTAL) BY MOUTH 3 (THREE) TIMES DAILY.  [name] glipiZIDE (GLUCOTROL XL) 10 MG 24 hr tablet TAKE 1 TABLET BY MOUTH DAILY WITH BREAKFAST.  [name] glucose blood (ONE TOUCH ULTRA TEST) test strip Use as instructed, twice daily code E11.52  . lidocaine-prilocaine (EMLA) cream APPLY A SMALL AMOUNT TO SKIN 3 TIMES A WEEK OVER DIALYSIS SHUNT 30 MINUTES PRIOR TO DIALYSIS (Patient not taking: Reported on 05/24/2018)  . lidocaine-prilocaine (EMLA) cream APPLY A SMALL AMOUNT TO SKIN 3 TIMES A WEEK OVER DIALYSIS SHUNT 30 MINUTES PRIOR TO DIALYSIS  . multivitamin (RENA-VIT) TABS tablet TAKE 1 TABLET BY MOUTH EVERY DAY AT BEDTIME  . nebivolol (BYSTOLIC) 5 MG tablet Take 1 tablet (5 mg total) by mouth daily.  [name]  VERIO test strip USE TO CHECK BLOOD SUGARS TWICE A DAY DX E11.9  . pantoprazole (PROTONIX) 40 MG tablet Take 1 tablet (40 mg total) by mouth 2 (two) times daily.  [name] saccharomyces boulardii (FLORASTOR) 250 MG capsule Take 1 capsule (250 mg total) by mouth daily.  . sevelamer carbonate (RENVELA) 800 MG tablet Take 1,600 mg by mouth 3 (three) times daily with meals.  . sitaGLIPtin (JANUVIA) 100 MG tablet Take 1 tablet (100 mg total) by mouth daily.   Current Facility-Administered Medications (Other)  Medication Route  . Bevacizumab (AVASTIN) SOLN 1.25 mg Intravitreal  . Bevacizumab (AVASTIN) SOLN 1.25 mg Intravitreal  . Bevacizumab  (AVASTIN) SOLN 1.25 mg Intravitreal  . Bevacizumab (AVASTIN) SOLN 1.25 mg Intravitreal      REVIEW OF SYSTEMS: ROS    Positive for: Endocrine, Cardiovascular, Eyes   Negative for: Constitutional, Gastrointestinal, Neurological, Skin, Genitourinary, Musculoskeletal, HENT, Respiratory, Psychiatric, Allergic/Imm, Heme/Lymph   Last edited by Debbrah Alar, COT on 05/24/2018 10:21 AM. (History)       ALLERGIES Allergies  Allergen Reactions  . Oxycodone Itching    PAST MEDICAL HISTORY Past Medical History:  Diagnosis Date  . Allergic rhinitis, cause unspecified 12/08/2010  . ANEMIA-IRON DEFICIENCY 01/25/2008  . Aortic regurgitation   . CAD (coronary artery disease)    a. 07/2016 NSTEMI/PCI: LM nl, LAD 70ost, 44m/d, 80d, D1 40ost, LCX 99ost/p (2.5x16 Synergy DES - 2.75), 40/53m, Om2 100 CTO, RCA  30p/m. Pt eval by CT surgery, not felt to be suitable candidate 2/2 comorbidities.  . CERVICAL RADICULOPATHY, LEFT 01/25/2008  . Chronic systolic CHF (congestive heart failure) ([place])    a. 07/2016 Echo: EF 40-45%, diff HK, sev AI, mild MVP w/ mod to sev MR, PASP 39mmHg.  . Critical lower limb ischemia   . DIABETES MELLITUS, UNCONTROLLED 05/21/2009  . Esophagitis   . ESRD on dialysis ([place])   . Foot ulcer ([place])    right lateral malleolus  . HCAP (healthcare-associated pneumonia) 06/29/2016  . HYPERLIPIDEMIA 03/30/2007  . HYPERTENSION 01/25/2008  . Ischemic cardiomyopathy    a. 07/2016 Echo: EF 40-45%.  . Mitral regurgitation    a. 07/2016 Echo: Mild MVP w/ mod to sev MR.  [name] PVD (peripheral vascular disease) ([place])    a. s/p bilat BKA  . SECONDARY HYPERPARATHYROIDISM 05/21/2009  . Severe aortic regurgitation    a. 07/2016 Echo: Severe AI.   Past Surgical History:  Procedure Laterality Date  . AMPUTATION Right 03/15/2014   Procedure: RIGHT  LEG  BELOW KNEE AMPUTATION ;  Surgeon: [name], MD;  Location: [facility];  Service: Orthopedics;  Laterality: Right;  . AMPUTATION Left 11/29/2015    Procedure: AMPUTATION BELOW KNEE;  Surgeon: Newt Minion, MD;  Location: [facility];  Service: Orthopedics;  Laterality: Left;  . AV FISTULA PLACEMENT Left   . AV FISTULA PLACEMENT Left 09/06/2014   Procedure: ARTERIOVENOUS (AV) FISTULA CREATION-Left Brachiocephalic;  Surgeon: Conrad Vernon, MD;  Location: [facility];  Service: Vascular;  Laterality: Left;  . CARDIAC CATHETERIZATION N/A 06/30/2016   Procedure: Left Heart Cath and Coronary Angiography;  Surgeon: Burnell Blanks, MD;  Location: [facility];  Service: Cardiovascular;  Laterality: N/A;  . CARDIAC CATHETERIZATION N/A 07/02/2016   Procedure: Coronary Stent Intervention;  Surgeon: Nelva Bush, MD;  Location: [facility];  Service: Cardiovascular;  Laterality: N/A;  . CATARACT EXTRACTION    . COLONOSCOPY    . CORONARY ANGIOPLASTY WITH STENT PLACEMENT  07/02/2016  . ESOPHAGOGASTRODUODENOSCOPY  N/A 03/20/2016   Procedure: ESOPHAGOGASTRODUODENOSCOPY (EGD);  Surgeon: Milus Banister, MD;  Location: [facility];  Service: Endoscopy;  Laterality: N/A;  . EYE SURGERY Bilateral    cataracts removed, left eye still has some oil in it.  . INSERTION OF DIALYSIS CATHETER N/A 09/03/2014   Procedure: INSERTION OF DIALYSIS CATHETER RIGHT INTERNAL JUGULAR VEIN;  Surgeon: Conrad La Crosse, MD;  Location: [facility];  Service: Vascular;  Laterality: N/A;  . PERIPHERAL VASCULAR CATHETERIZATION N/A 08/13/2015   Procedure: Abdominal Aortogram;  Surgeon: Elam Dutch, MD;  Location: [facility];  Service: Cardiovascular;  Laterality: N/A;    FAMILY HISTORY Family History  Problem Relation Age of Onset  . Cancer Mother        Pancreatic    SOCIAL HISTORY Social History   Tobacco Use  . Smoking status: Never Smoker  . Smokeless tobacco: Never Used  Substance Use Topics  . Alcohol use: No    Alcohol/week: 0.0 standard drinks  . Drug use: No         OPHTHALMIC EXAM:  Base Eye Exam    Visual Acuity (Snellen - Linear)       Right Left   Dist cc 20/70 -2 20/50 +2   Dist ph cc 20/60 -1 20/40 -1   Correction:  Glasses       Tonometry (Tonopen, 10:30 AM)      Right Left   Pressure 18 19       Visual Fields (Counting fingers)      Left Right    Full Full       Extraocular Movement      Right Left    Full, Ortho Full, Ortho       Neuro/Psych    Oriented x3:  Yes   Mood/Affect:  Normal       Dilation    Both eyes:  1.0% Mydriacyl, 2.5% Phenylephrine @ 10:30 AM        Slit Lamp and Fundus Exam    Slit Lamp Exam      Right Left   Lids/Lashes dermatochalasis dermatochalasis   Conjunctiva/Sclera White and quiet White and quiet   Cornea arcus; 1+ PEE; 2+ guttae arcus; 1-2+ guttae   Anterior Chamber Deep and quiet Deep and quiet   Iris Round, dilated; no NVI Round, dilated; large PI at 0600 no NVI   Lens PCIOL in good position PCIOL in good position   Vitreous clear clear       Fundus Exam      Right Left   Disc Mild Pallor Mild Pallor   C/D Ratio 0.3 0.35   Macula Blunted foveal reflex with positive edema, Inferior Epiretinal membrane, scattered MA Blunted foveal reflex, scattered Exudates, Retinal pigment epithelial mottling, focal laser scars   Vessels Vascular attenuation, sclerotic arterioles sclerotic arterioles, generalized attenuation   Periphery 360 PRP, scattered areas of fibrosis 360 PRP in place, areas of fibrosis          IMAGING AND PROCEDURES  Imaging and Procedures for 12/30/17  OCT, Retina - OU - Both Eyes       Right Eye Quality was good. Central Foveal Thickness: 278. Progression has been stable. Findings include abnormal foveal contour, no SRF, intraretinal fluid, epiretinal membrane, intraretinal hyper-reflective material, outer retinal atrophy, inner retinal atrophy (Persistent IRF).   Left Eye Quality was good. Central Foveal Thickness: 292. Progression has been stable. Findings include abnormal foveal contour, no IRF, no SRF, macular pucker, epiretinal  membrane,  outer retinal atrophy (Rare cystic change temporally).   Notes *Images captured and stored on drive  Diagnosis / Impression:  OD: persistent DME/IRF; ERM stable from prior OS: no DME; +ERM w/ some pucker and preretinal fibrosis   Clinical management:  See below  Abbreviations: NFP - Normal foveal profile. CME - cystoid macular edema. PED - pigment epithelial detachment. IRF - intraretinal fluid. SRF - subretinal fluid. EZ - ellipsoid zone. ERM - epiretinal membrane. ORA - outer retinal atrophy. ORT - outer retinal tubulation. SRHM - subretinal hyper-reflective material         Intravitreal Injection, Pharmacologic Agent - OD - Right Eye       Time Out 05/24/2018. 11:32 AM. Confirmed correct patient, procedure, site, and patient consented.   Anesthesia Topical anesthesia was used. Anesthetic medications included Lidocaine 2%, Tetracaine 0.5%.   Procedure Preparation included 5% betadine to ocular surface, eyelid speculum. A 30 gauge needle was used.   Injection:  2 mg aflibercept 2 MG/0.05ML   NDC: 61755-005-02, Lot: [id number], Expiration date: 03/30/2019   Route: Intravitreal, Site: Right Eye, Waste: 0.05 mL  Post-op Post injection exam found visual acuity of at least counting fingers. The patient tolerated the procedure well. There were no complications. The patient received written and verbal post procedure care education.   Notes **Sample medication administered**                ASSESSMENT/PLAN:    ICD-10-CM   1. Proliferative diabetic retinopathy of right eye with macular edema associated with type 2 diabetes mellitus (HCC) E11.3511 OCT, Retina - OU - Both Eyes    Intravitreal Injection, Pharmacologic Agent - OD - Right Eye    aflibercept (EYLEA) SOLN 2 mg  2. Stable proliferative diabetic retinopathy of left eye associated with type 2 diabetes mellitus ([place]) D78.2423   3. Retinal edema H35.81 OCT, Retina - OU - Both Eyes  4. Pseudophakia of both  eyes Z96.1   5. Corneal guttata H18.51     1,2,3. Proliferative diabetic retinopathy OU- - s/p PPV OU with [facility] in 2014 - s/p PRP OU with [facility] - S/P IVA OD #1 (02.26.19), #2 (04.02.19), #3 (05.02.19), #4 (05.30.19) - S/P IVTA #1 OD (06.27.19) -- switched due to minimal improvement on IVA, #2 (08.13.19)  - exam shows good PRP 360 OU; sclerotic arterioles with sheathing OU; scattered DBH OU - BCVA decreased to 20/60 from 20/50 today - OCT shows persistent diabetic macular edema OD -- minimal response to IVTA x2 - IOP stable at 18 mmHg, without spike OD - recommend IVE #1 OD today, (09.24.19) - sample administered - pt wishes to proceed - RBA of procedure discussed, questions answered - informed consent obtained and signed - see procedure note - Eylea benefits paperwork signed on 09.24.19 - f/u in 4 weeks  4. Pseudophakia OU  - s/p CE/IOL OU by Dr. Elliot Dally  - beautiful surgery, doing well  - monitor  5. Corneal guttata OU - no edema - monitor   Ophthalmic Meds Ordered this visit:  Meds ordered this encounter  Medications  . aflibercept (EYLEA) SOLN 2 mg       Return in about 4 weeks (around 06/21/2018) for F/U PDR OU, DFE, OCT.  There are no Patient Instructions on file for this visit.   Explained the diagnoses, plan, and follow up with the patient and they expressed understanding.  Patient expressed understanding of the importance of proper follow up care.   This document serves as a record  of services personally performed by Gardiner Sleeper, MD, PhD. It was created on their behalf by Ernest Mallick, OA, an ophthalmic assistant. The creation of this record is the provider's dictation and/or activities during the visit.    Electronically signed by: Ernest Mallick, OA  09.23.19 4:55 PM    Gardiner Sleeper, M.D., Ph.D. Diseases & Surgery of the Retina and Vitreous [facility]   I have reviewed the above documentation for accuracy and  completeness, and I agree with the above. Gardiner Sleeper, M.D., Ph.D. 05/24/18 4:57 PM    Abbreviations: M myopia (nearsighted); A astigmatism; H hyperopia (farsighted); P presbyopia; Mrx spectacle prescription;  CTL contact lenses; OD right eye; OS left eye; OU both eyes  XT exotropia; ET esotropia; PEK punctate epithelial keratitis; PEE punctate epithelial erosions; DES dry eye syndrome; MGD meibomian gland dysfunction; ATs artificial tears; PFAT's preservative free artificial tears; [place] nuclear sclerotic cataract; PSC posterior subcapsular cataract; ERM epi-retinal membrane; PVD posterior vitreous detachment; RD retinal detachment; DM diabetes mellitus; DR diabetic retinopathy; NPDR non-proliferative diabetic retinopathy; PDR proliferative diabetic retinopathy; CSME clinically significant macular edema; DME diabetic macular edema; dbh dot blot hemorrhages; CWS cotton wool spot; POAG primary open angle glaucoma; C/D cup-to-disc ratio; HVF humphrey visual field; GVF goldmann visual field; OCT optical coherence tomography; IOP intraocular pressure; BRVO Branch retinal vein occlusion; CRVO central retinal vein occlusion; CRAO central retinal artery occlusion; BRAO branch retinal artery occlusion; RT retinal tear; SB scleral buckle; PPV pars plana vitrectomy; VH Vitreous hemorrhage; PRP panretinal laser photocoagulation; IVK intravitreal kenalog; VMT vitreomacular traction; MH Macular hole;  NVD neovascularization of the disc; NVE neovascularization elsewhere; AREDS age related eye disease study; ARMD age related macular degeneration; POAG primary open angle glaucoma; EBMD epithelial/anterior basement membrane dystrophy; ACIOL anterior chamber intraocular lens; IOL intraocular lens; PCIOL posterior chamber intraocular lens; Phaco/IOL phacoemulsification with intraocular lens placement; [place] photorefractive keratectomy; LASIK laser assisted in situ keratomileusis; HTN hypertension; DM diabetes mellitus; COPD  chronic obstructive pulmonary disease

## 2018-05-24 ENCOUNTER — Encounter (INDEPENDENT_AMBULATORY_CARE_PROVIDER_SITE_OTHER): Payer: Self-pay | Admitting: Ophthalmology

## 2018-05-24 ENCOUNTER — Ambulatory Visit (INDEPENDENT_AMBULATORY_CARE_PROVIDER_SITE_OTHER): Payer: Medicare Other | Admitting: Ophthalmology

## 2018-05-24 DIAGNOSIS — E113552 Type 2 diabetes mellitus with stable proliferative diabetic retinopathy, left eye: Secondary | ICD-10-CM

## 2018-05-24 DIAGNOSIS — E113511 Type 2 diabetes mellitus with proliferative diabetic retinopathy with macular edema, right eye: Secondary | ICD-10-CM

## 2018-05-24 DIAGNOSIS — H3581 Retinal edema: Secondary | ICD-10-CM

## 2018-05-24 DIAGNOSIS — H1851 Endothelial corneal dystrophy: Secondary | ICD-10-CM | POA: Diagnosis not present

## 2018-05-24 DIAGNOSIS — Z961 Presence of intraocular lens: Secondary | ICD-10-CM

## 2018-05-24 DIAGNOSIS — H18519 Endothelial corneal dystrophy, unspecified eye: Secondary | ICD-10-CM

## 2018-05-24 MED ORDER — AFLIBERCEPT 2MG/0.05ML IZ SOLN FOR KALEIDOSCOPE
2.0000 mg | INTRAVITREAL | Status: DC
  Administered 2018-05-24: 2 mg via INTRAVITREAL

## 2018-05-25 DIAGNOSIS — Z23 Encounter for immunization: Secondary | ICD-10-CM | POA: Diagnosis not present

## 2018-05-25 DIAGNOSIS — D631 Anemia in chronic kidney disease: Secondary | ICD-10-CM | POA: Diagnosis not present

## 2018-05-25 DIAGNOSIS — N186 End stage renal disease: Secondary | ICD-10-CM | POA: Diagnosis not present

## 2018-05-25 DIAGNOSIS — N2581 Secondary hyperparathyroidism of renal origin: Secondary | ICD-10-CM | POA: Diagnosis not present

## 2018-05-25 DIAGNOSIS — E876 Hypokalemia: Secondary | ICD-10-CM | POA: Diagnosis not present

## 2018-05-25 DIAGNOSIS — E1122 Type 2 diabetes mellitus with diabetic chronic kidney disease: Secondary | ICD-10-CM | POA: Diagnosis not present

## 2018-05-27 DIAGNOSIS — D631 Anemia in chronic kidney disease: Secondary | ICD-10-CM | POA: Diagnosis not present

## 2018-05-27 DIAGNOSIS — Z23 Encounter for immunization: Secondary | ICD-10-CM | POA: Diagnosis not present

## 2018-05-27 DIAGNOSIS — N2581 Secondary hyperparathyroidism of renal origin: Secondary | ICD-10-CM | POA: Diagnosis not present

## 2018-05-27 DIAGNOSIS — N186 End stage renal disease: Secondary | ICD-10-CM | POA: Diagnosis not present

## 2018-05-27 DIAGNOSIS — E1122 Type 2 diabetes mellitus with diabetic chronic kidney disease: Secondary | ICD-10-CM | POA: Diagnosis not present

## 2018-05-27 DIAGNOSIS — E876 Hypokalemia: Secondary | ICD-10-CM | POA: Diagnosis not present

## 2018-05-30 ENCOUNTER — Other Ambulatory Visit: Payer: Self-pay | Admitting: Internal Medicine

## 2018-05-30 DIAGNOSIS — N2581 Secondary hyperparathyroidism of renal origin: Secondary | ICD-10-CM | POA: Diagnosis not present

## 2018-05-30 DIAGNOSIS — D631 Anemia in chronic kidney disease: Secondary | ICD-10-CM | POA: Diagnosis not present

## 2018-05-30 DIAGNOSIS — Z23 Encounter for immunization: Secondary | ICD-10-CM | POA: Diagnosis not present

## 2018-05-30 DIAGNOSIS — N186 End stage renal disease: Secondary | ICD-10-CM | POA: Diagnosis not present

## 2018-05-30 DIAGNOSIS — E876 Hypokalemia: Secondary | ICD-10-CM | POA: Diagnosis not present

## 2018-05-30 DIAGNOSIS — E1122 Type 2 diabetes mellitus with diabetic chronic kidney disease: Secondary | ICD-10-CM | POA: Diagnosis not present

## 2018-05-31 DIAGNOSIS — E1129 Type 2 diabetes mellitus with other diabetic kidney complication: Secondary | ICD-10-CM | POA: Diagnosis not present

## 2018-05-31 DIAGNOSIS — Z992 Dependence on renal dialysis: Secondary | ICD-10-CM | POA: Diagnosis not present

## 2018-05-31 DIAGNOSIS — N186 End stage renal disease: Secondary | ICD-10-CM | POA: Diagnosis not present

## 2018-06-01 DIAGNOSIS — D509 Iron deficiency anemia, unspecified: Secondary | ICD-10-CM | POA: Diagnosis not present

## 2018-06-01 DIAGNOSIS — N2581 Secondary hyperparathyroidism of renal origin: Secondary | ICD-10-CM | POA: Diagnosis not present

## 2018-06-01 DIAGNOSIS — Z23 Encounter for immunization: Secondary | ICD-10-CM | POA: Diagnosis not present

## 2018-06-01 DIAGNOSIS — D631 Anemia in chronic kidney disease: Secondary | ICD-10-CM | POA: Diagnosis not present

## 2018-06-01 DIAGNOSIS — E1122 Type 2 diabetes mellitus with diabetic chronic kidney disease: Secondary | ICD-10-CM | POA: Diagnosis not present

## 2018-06-01 DIAGNOSIS — N186 End stage renal disease: Secondary | ICD-10-CM | POA: Diagnosis not present

## 2018-06-01 DIAGNOSIS — E876 Hypokalemia: Secondary | ICD-10-CM | POA: Diagnosis not present

## 2018-06-03 DIAGNOSIS — N2581 Secondary hyperparathyroidism of renal origin: Secondary | ICD-10-CM | POA: Diagnosis not present

## 2018-06-03 DIAGNOSIS — E1122 Type 2 diabetes mellitus with diabetic chronic kidney disease: Secondary | ICD-10-CM | POA: Diagnosis not present

## 2018-06-03 DIAGNOSIS — D509 Iron deficiency anemia, unspecified: Secondary | ICD-10-CM | POA: Diagnosis not present

## 2018-06-03 DIAGNOSIS — N186 End stage renal disease: Secondary | ICD-10-CM | POA: Diagnosis not present

## 2018-06-03 DIAGNOSIS — E876 Hypokalemia: Secondary | ICD-10-CM | POA: Diagnosis not present

## 2018-06-03 DIAGNOSIS — D631 Anemia in chronic kidney disease: Secondary | ICD-10-CM | POA: Diagnosis not present

## 2018-06-06 DIAGNOSIS — N2581 Secondary hyperparathyroidism of renal origin: Secondary | ICD-10-CM | POA: Diagnosis not present

## 2018-06-06 DIAGNOSIS — E1122 Type 2 diabetes mellitus with diabetic chronic kidney disease: Secondary | ICD-10-CM | POA: Diagnosis not present

## 2018-06-06 DIAGNOSIS — D509 Iron deficiency anemia, unspecified: Secondary | ICD-10-CM | POA: Diagnosis not present

## 2018-06-06 DIAGNOSIS — N186 End stage renal disease: Secondary | ICD-10-CM | POA: Diagnosis not present

## 2018-06-06 DIAGNOSIS — D631 Anemia in chronic kidney disease: Secondary | ICD-10-CM | POA: Diagnosis not present

## 2018-06-06 DIAGNOSIS — E876 Hypokalemia: Secondary | ICD-10-CM | POA: Diagnosis not present

## 2018-06-08 DIAGNOSIS — E876 Hypokalemia: Secondary | ICD-10-CM | POA: Diagnosis not present

## 2018-06-08 DIAGNOSIS — N186 End stage renal disease: Secondary | ICD-10-CM | POA: Diagnosis not present

## 2018-06-08 DIAGNOSIS — E1122 Type 2 diabetes mellitus with diabetic chronic kidney disease: Secondary | ICD-10-CM | POA: Diagnosis not present

## 2018-06-08 DIAGNOSIS — D509 Iron deficiency anemia, unspecified: Secondary | ICD-10-CM | POA: Diagnosis not present

## 2018-06-08 DIAGNOSIS — D631 Anemia in chronic kidney disease: Secondary | ICD-10-CM | POA: Diagnosis not present

## 2018-06-08 DIAGNOSIS — N2581 Secondary hyperparathyroidism of renal origin: Secondary | ICD-10-CM | POA: Diagnosis not present

## 2018-06-10 DIAGNOSIS — D631 Anemia in chronic kidney disease: Secondary | ICD-10-CM | POA: Diagnosis not present

## 2018-06-10 DIAGNOSIS — E1122 Type 2 diabetes mellitus with diabetic chronic kidney disease: Secondary | ICD-10-CM | POA: Diagnosis not present

## 2018-06-10 DIAGNOSIS — E876 Hypokalemia: Secondary | ICD-10-CM | POA: Diagnosis not present

## 2018-06-10 DIAGNOSIS — N2581 Secondary hyperparathyroidism of renal origin: Secondary | ICD-10-CM | POA: Diagnosis not present

## 2018-06-10 DIAGNOSIS — D509 Iron deficiency anemia, unspecified: Secondary | ICD-10-CM | POA: Diagnosis not present

## 2018-06-10 DIAGNOSIS — N186 End stage renal disease: Secondary | ICD-10-CM | POA: Diagnosis not present

## 2018-06-13 DIAGNOSIS — N2581 Secondary hyperparathyroidism of renal origin: Secondary | ICD-10-CM | POA: Diagnosis not present

## 2018-06-13 DIAGNOSIS — E876 Hypokalemia: Secondary | ICD-10-CM | POA: Diagnosis not present

## 2018-06-13 DIAGNOSIS — E1122 Type 2 diabetes mellitus with diabetic chronic kidney disease: Secondary | ICD-10-CM | POA: Diagnosis not present

## 2018-06-13 DIAGNOSIS — D509 Iron deficiency anemia, unspecified: Secondary | ICD-10-CM | POA: Diagnosis not present

## 2018-06-13 DIAGNOSIS — D631 Anemia in chronic kidney disease: Secondary | ICD-10-CM | POA: Diagnosis not present

## 2018-06-13 DIAGNOSIS — N186 End stage renal disease: Secondary | ICD-10-CM | POA: Diagnosis not present

## 2018-06-15 DIAGNOSIS — N186 End stage renal disease: Secondary | ICD-10-CM | POA: Diagnosis not present

## 2018-06-15 DIAGNOSIS — N2581 Secondary hyperparathyroidism of renal origin: Secondary | ICD-10-CM | POA: Diagnosis not present

## 2018-06-15 DIAGNOSIS — E876 Hypokalemia: Secondary | ICD-10-CM | POA: Diagnosis not present

## 2018-06-15 DIAGNOSIS — E1122 Type 2 diabetes mellitus with diabetic chronic kidney disease: Secondary | ICD-10-CM | POA: Diagnosis not present

## 2018-06-15 DIAGNOSIS — D631 Anemia in chronic kidney disease: Secondary | ICD-10-CM | POA: Diagnosis not present

## 2018-06-15 DIAGNOSIS — D509 Iron deficiency anemia, unspecified: Secondary | ICD-10-CM | POA: Diagnosis not present

## 2018-06-17 DIAGNOSIS — D631 Anemia in chronic kidney disease: Secondary | ICD-10-CM | POA: Diagnosis not present

## 2018-06-17 DIAGNOSIS — N186 End stage renal disease: Secondary | ICD-10-CM | POA: Diagnosis not present

## 2018-06-17 DIAGNOSIS — E1122 Type 2 diabetes mellitus with diabetic chronic kidney disease: Secondary | ICD-10-CM | POA: Diagnosis not present

## 2018-06-17 DIAGNOSIS — D509 Iron deficiency anemia, unspecified: Secondary | ICD-10-CM | POA: Diagnosis not present

## 2018-06-17 DIAGNOSIS — N2581 Secondary hyperparathyroidism of renal origin: Secondary | ICD-10-CM | POA: Diagnosis not present

## 2018-06-17 DIAGNOSIS — E876 Hypokalemia: Secondary | ICD-10-CM | POA: Diagnosis not present

## 2018-06-20 DIAGNOSIS — N2581 Secondary hyperparathyroidism of renal origin: Secondary | ICD-10-CM | POA: Diagnosis not present

## 2018-06-20 DIAGNOSIS — N186 End stage renal disease: Secondary | ICD-10-CM | POA: Diagnosis not present

## 2018-06-20 DIAGNOSIS — D509 Iron deficiency anemia, unspecified: Secondary | ICD-10-CM | POA: Diagnosis not present

## 2018-06-20 DIAGNOSIS — E1122 Type 2 diabetes mellitus with diabetic chronic kidney disease: Secondary | ICD-10-CM | POA: Diagnosis not present

## 2018-06-20 DIAGNOSIS — E876 Hypokalemia: Secondary | ICD-10-CM | POA: Diagnosis not present

## 2018-06-20 DIAGNOSIS — D631 Anemia in chronic kidney disease: Secondary | ICD-10-CM | POA: Diagnosis not present

## 2018-06-21 ENCOUNTER — Encounter (INDEPENDENT_AMBULATORY_CARE_PROVIDER_SITE_OTHER): Payer: Medicare Other | Admitting: Ophthalmology

## 2018-06-22 DIAGNOSIS — D509 Iron deficiency anemia, unspecified: Secondary | ICD-10-CM | POA: Diagnosis not present

## 2018-06-22 DIAGNOSIS — E876 Hypokalemia: Secondary | ICD-10-CM | POA: Diagnosis not present

## 2018-06-22 DIAGNOSIS — E1122 Type 2 diabetes mellitus with diabetic chronic kidney disease: Secondary | ICD-10-CM | POA: Diagnosis not present

## 2018-06-22 DIAGNOSIS — D631 Anemia in chronic kidney disease: Secondary | ICD-10-CM | POA: Diagnosis not present

## 2018-06-22 DIAGNOSIS — N186 End stage renal disease: Secondary | ICD-10-CM | POA: Diagnosis not present

## 2018-06-22 DIAGNOSIS — N2581 Secondary hyperparathyroidism of renal origin: Secondary | ICD-10-CM | POA: Diagnosis not present

## 2018-06-23 ENCOUNTER — Telehealth (INDEPENDENT_AMBULATORY_CARE_PROVIDER_SITE_OTHER): Payer: Self-pay

## 2018-06-24 DIAGNOSIS — N186 End stage renal disease: Secondary | ICD-10-CM | POA: Diagnosis not present

## 2018-06-24 DIAGNOSIS — D509 Iron deficiency anemia, unspecified: Secondary | ICD-10-CM | POA: Diagnosis not present

## 2018-06-24 DIAGNOSIS — N2581 Secondary hyperparathyroidism of renal origin: Secondary | ICD-10-CM | POA: Diagnosis not present

## 2018-06-24 DIAGNOSIS — E876 Hypokalemia: Secondary | ICD-10-CM | POA: Diagnosis not present

## 2018-06-24 DIAGNOSIS — E1122 Type 2 diabetes mellitus with diabetic chronic kidney disease: Secondary | ICD-10-CM | POA: Diagnosis not present

## 2018-06-24 DIAGNOSIS — D631 Anemia in chronic kidney disease: Secondary | ICD-10-CM | POA: Diagnosis not present

## 2018-06-25 ENCOUNTER — Other Ambulatory Visit: Payer: Self-pay | Admitting: Internal Medicine

## 2018-06-27 DIAGNOSIS — D631 Anemia in chronic kidney disease: Secondary | ICD-10-CM | POA: Diagnosis not present

## 2018-06-27 DIAGNOSIS — N186 End stage renal disease: Secondary | ICD-10-CM | POA: Diagnosis not present

## 2018-06-27 DIAGNOSIS — N2581 Secondary hyperparathyroidism of renal origin: Secondary | ICD-10-CM | POA: Diagnosis not present

## 2018-06-27 DIAGNOSIS — D509 Iron deficiency anemia, unspecified: Secondary | ICD-10-CM | POA: Diagnosis not present

## 2018-06-27 DIAGNOSIS — E876 Hypokalemia: Secondary | ICD-10-CM | POA: Diagnosis not present

## 2018-06-27 DIAGNOSIS — E1122 Type 2 diabetes mellitus with diabetic chronic kidney disease: Secondary | ICD-10-CM | POA: Diagnosis not present

## 2018-06-29 DIAGNOSIS — E1122 Type 2 diabetes mellitus with diabetic chronic kidney disease: Secondary | ICD-10-CM | POA: Diagnosis not present

## 2018-06-29 DIAGNOSIS — N2581 Secondary hyperparathyroidism of renal origin: Secondary | ICD-10-CM | POA: Diagnosis not present

## 2018-06-29 DIAGNOSIS — D509 Iron deficiency anemia, unspecified: Secondary | ICD-10-CM | POA: Diagnosis not present

## 2018-06-29 DIAGNOSIS — D631 Anemia in chronic kidney disease: Secondary | ICD-10-CM | POA: Diagnosis not present

## 2018-06-29 DIAGNOSIS — E876 Hypokalemia: Secondary | ICD-10-CM | POA: Diagnosis not present

## 2018-06-29 DIAGNOSIS — N186 End stage renal disease: Secondary | ICD-10-CM | POA: Diagnosis not present

## 2018-07-01 DIAGNOSIS — N186 End stage renal disease: Secondary | ICD-10-CM | POA: Diagnosis not present

## 2018-07-01 DIAGNOSIS — N2581 Secondary hyperparathyroidism of renal origin: Secondary | ICD-10-CM | POA: Diagnosis not present

## 2018-07-01 DIAGNOSIS — E1122 Type 2 diabetes mellitus with diabetic chronic kidney disease: Secondary | ICD-10-CM | POA: Diagnosis not present

## 2018-07-01 DIAGNOSIS — D509 Iron deficiency anemia, unspecified: Secondary | ICD-10-CM | POA: Diagnosis not present

## 2018-07-01 DIAGNOSIS — D631 Anemia in chronic kidney disease: Secondary | ICD-10-CM | POA: Diagnosis not present

## 2018-07-01 DIAGNOSIS — E876 Hypokalemia: Secondary | ICD-10-CM | POA: Diagnosis not present

## 2018-07-01 DIAGNOSIS — E1129 Type 2 diabetes mellitus with other diabetic kidney complication: Secondary | ICD-10-CM | POA: Diagnosis not present

## 2018-07-01 DIAGNOSIS — Z992 Dependence on renal dialysis: Secondary | ICD-10-CM | POA: Diagnosis not present

## 2018-07-04 DIAGNOSIS — D631 Anemia in chronic kidney disease: Secondary | ICD-10-CM | POA: Diagnosis not present

## 2018-07-04 DIAGNOSIS — N2581 Secondary hyperparathyroidism of renal origin: Secondary | ICD-10-CM | POA: Diagnosis not present

## 2018-07-04 DIAGNOSIS — E1122 Type 2 diabetes mellitus with diabetic chronic kidney disease: Secondary | ICD-10-CM | POA: Diagnosis not present

## 2018-07-04 DIAGNOSIS — D509 Iron deficiency anemia, unspecified: Secondary | ICD-10-CM | POA: Diagnosis not present

## 2018-07-04 DIAGNOSIS — E876 Hypokalemia: Secondary | ICD-10-CM | POA: Diagnosis not present

## 2018-07-04 DIAGNOSIS — N186 End stage renal disease: Secondary | ICD-10-CM | POA: Diagnosis not present

## 2018-07-06 DIAGNOSIS — E876 Hypokalemia: Secondary | ICD-10-CM | POA: Diagnosis not present

## 2018-07-06 DIAGNOSIS — N2581 Secondary hyperparathyroidism of renal origin: Secondary | ICD-10-CM | POA: Diagnosis not present

## 2018-07-06 DIAGNOSIS — D509 Iron deficiency anemia, unspecified: Secondary | ICD-10-CM | POA: Diagnosis not present

## 2018-07-06 DIAGNOSIS — D631 Anemia in chronic kidney disease: Secondary | ICD-10-CM | POA: Diagnosis not present

## 2018-07-06 DIAGNOSIS — N186 End stage renal disease: Secondary | ICD-10-CM | POA: Diagnosis not present

## 2018-07-06 DIAGNOSIS — E1122 Type 2 diabetes mellitus with diabetic chronic kidney disease: Secondary | ICD-10-CM | POA: Diagnosis not present

## 2018-07-07 ENCOUNTER — Ambulatory Visit (INDEPENDENT_AMBULATORY_CARE_PROVIDER_SITE_OTHER): Payer: Medicare Other | Admitting: Family

## 2018-07-07 ENCOUNTER — Encounter: Payer: Self-pay | Admitting: Family

## 2018-07-07 VITALS — BP 160/68 | HR 78 | Temp 99.9°F | Ht 59.0 in | Wt 139.1 lb

## 2018-07-07 DIAGNOSIS — J040 Acute laryngitis: Secondary | ICD-10-CM

## 2018-07-07 MED ORDER — BENZONATATE 100 MG PO CAPS: 100.0000 mg | ORAL_CAPSULE | Freq: Three times a day (TID) | ORAL | 0 refills | Status: DC | PRN

## 2018-07-07 MED ORDER — AZITHROMYCIN 250 MG PO TABS: ORAL_TABLET | ORAL | 0 refills | Status: DC

## 2018-07-07 NOTE — Patient Instructions (Signed)
Laryngitis Laryngitis is swelling (inflammation) of your vocal cords. This causes hoarseness, coughing, loss of voice, sore throat, or a dry throat. When your vocal cords are inflamed, your voice sounds different. Laryngitis can be temporary (acute) or long-term (chronic). Most cases of acute laryngitis improve with time. Chronic laryngitis is laryngitis that lasts for more than three weeks. Follow these instructions at home:  Drink enough fluid to keep your pee (urine) clear or pale yellow.  Breathe in moist air. Use a humidifier if you live in a dry climate.  Take medicines only as told by your doctor.  Do not smoke cigarettes or electronic cigarettes. If you need help quitting, ask your doctor.  Talk as little as possible. Also avoid whispering, which can cause vocal strain.  Write instead of talking. Do this until your voice is back to normal. Contact a doctor if:  You have a fever.  Your pain is worse.  You have trouble swallowing. Get help right away if:  You cough up blood.  You have trouble breathing. This information is not intended to replace advice given to you by your health care provider. Make sure you discuss any questions you have with your health care provider. Document Released: 08/06/2011 Document Revised: 01/23/2016 Document Reviewed: 01/30/2014 Elsevier Interactive Patient Education  2018 [employer]  

## 2018-07-07 NOTE — Progress Notes (Signed)
Carolyn Valenzuela is a 67 y.o. female with the following history as recorded in EpicCare:  Patient Active Problem List   Diagnosis Date Noted  . Left hip pain 11/18/2017  . GERD (gastroesophageal reflux disease) 10/10/2017  . Bloating 10/10/2017  . PVD (peripheral vascular disease) ([place])   . Phantom pain after amputation of lower extremity ([place]) 01/05/2017  . Abnormal CXR 07/07/2016  . Malnutrition of moderate degree 07/03/2016  . Hypotension   . NSTEMI (non-ST elevated myocardial infarction) ([place])   . Nausea and vomiting 03/18/2016  . Sepsis ([place]) due to UTI 12/20/2015  . Acute lower UTI   . Labile blood pressure   . SIRS (systemic inflammatory response syndrome) (HCC)   . Hypoglycemia associated with diabetes ([place])   . Urinary retention   . Dysphagia   . S/P bilateral BKA (below knee amputation) ([place])   . Abnormality of gait   . Muscle spasm   . ESRD on dialysis ([place])   . Type 2 diabetes mellitus with diabetic peripheral angiopathy and gangrene, without long-term current use of insulin ([place])   . Post-operative pain   . Metabolic bone disease   . Hypoxia   . Type 2 diabetes mellitus with peripheral neuropathy (HCC)   . Labile blood glucose   . Postoperative pain   . Acute blood loss anemia   . Anemia of chronic disease   . Below knee amputation status 11/29/2015  . Odynophagia 11/12/2015  . Chest pain, atypical 06/06/2015  . Ingrown nail 05/28/2015  . Acute pulmonary edema (HCC)   . FUO (fever of unknown origin)   . HCAP (healthcare-associated pneumonia)   . Type 2 diabetes mellitus with ESRD (end-stage renal disease) ([place]) 09/01/2014  . Anemia of renal disease 09/01/2014  . Venous insufficiency of left leg 08/27/2014  . Rash 07/06/2014  . 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 Critical lower limb ischemia 01/09/2014  . Right foot ulcer ([place]) 11/07/2013  . Toe pain 09/06/2013  . Pre-ulcerative corn or callous 09/06/2013  . Lower extremity pain  09/06/2013  . Low back pain 12/08/2011  . Hip bursitis, left 12/08/2011  . Left leg pain 12/08/2011  . Abdominal pain, other specified site 07/08/2011  . Preventative health care 12/08/2010  . Allergic rhinitis, cause unspecified 12/08/2010  . SECONDARY HYPERPARATHYROIDISM 05/21/2009  . NUMBNESS 05/21/2009  . BACK PAIN 05/02/2009  . ANEMIA-IRON DEFICIENCY 01/25/2008  . Essential hypertension 01/25/2008  . CERVICAL RADICULOPATHY, LEFT 01/25/2008  . Hyperlipidemia 03/30/2007    Current Outpatient Medications  Medication Sig Dispense Refill  . aspirin 81 MG EC tablet Take 81 mg by mouth daily.      . cinacalcet (SENSIPAR) 30 MG tablet Take 30 mg by mouth daily.  5  . clopidogrel (PLAVIX) 75 MG tablet TAKE 1 TABLET BY MOUTH EVERY DAY 90 tablet 3  . famotidine (PEPCID) 20 MG tablet Take 1 tablet (20 mg total) by mouth daily. 90 tablet 3  . gabapentin (NEURONTIN) 100 MG capsule TAKE 2 CAPSULES (200 MG TOTAL) BY MOUTH 3 (THREE) TIMES DAILY. 540 capsule 1  . glipiZIDE (GLUCOTROL XL) 10 MG 24 hr tablet TAKE 1 TABLET BY MOUTH DAILY WITH BREAKFAST. 90 tablet 0  . glucose blood (ONE TOUCH ULTRA TEST) test strip Use as instructed, twice daily code E11.52 200 each 12  . lidocaine-prilocaine (EMLA) cream APPLY A SMALL AMOUNT TO SKIN 3 TIMES A WEEK OVER DIALYSIS SHUNT 30 MINUTES PRIOR TO DIALYSIS 30 g 5  .  lidocaine-prilocaine (EMLA) cream APPLY A SMALL AMOUNT TO SKIN 3 TIMES A WEEK OVER DIALYSIS SHUNT 30 MINUTES PRIOR TO DIALYSIS 30 g 5  . multivitamin (RENA-VIT) TABS tablet TAKE 1 TABLET BY MOUTH EVERY DAY AT BEDTIME 30 tablet 11  . nebivolol (BYSTOLIC) 5 MG tablet Take 1 tablet (5 mg total) by mouth daily. 90 tablet 3  . ONETOUCH VERIO test strip USE TO CHECK BLOOD SUGARS TWICE A DAY DX E11.9 100 each 5  . pantoprazole (PROTONIX) 40 MG tablet Take 1 tablet (40 mg total) by mouth 2 (two) times daily. 60 tablet 5  . PAZEO 0.7 % SOLN INSTILL 1 DROP INTO BOTH EYES EVERY DAY IN THE MORNING  12  .  RESTASIS 0.05 % ophthalmic emulsion INSTILL 1 DROP INTO BOTH EYES EVERY 12 HOURS  4  . saccharomyces boulardii (FLORASTOR) 250 MG capsule Take 1 capsule (250 mg total) by mouth daily. 90 capsule 3  . sevelamer carbonate (RENVELA) 800 MG tablet Take 1,600 mg by mouth 3 (three) times daily with meals.    . sitaGLIPtin (JANUVIA) 100 MG tablet Take 1 tablet (100 mg total) by mouth daily. 90 tablet 3  . amLODipine (NORVASC) 5 MG tablet Take 1 tablet (5 mg total) by mouth daily. 90 tablet 3  . azithromycin (ZITHROMAX) 250 MG tablet 2 tabs po qd x 1 day; 1 tablet per day x 4 days; 6 tablet 0  . benzonatate (TESSALON) 100 MG capsule Take 1 capsule (100 mg total) by mouth 3 (three) times daily as needed. 20 capsule 0   Current Facility-Administered Medications  Medication Dose Route Frequency Provider Last Rate Last Dose  . aflibercept (EYLEA) SOLN 2 mg  2 mg Intravitreal  Bernarda Caffey, MD   2 mg at 05/24/18 1654  . Bevacizumab (AVASTIN) SOLN 1.25 mg  1.25 mg Intravitreal  Bernarda Caffey, MD   1.25 mg at 10/26/17 1520  . Bevacizumab (AVASTIN) SOLN 1.25 mg  1.25 mg Intravitreal  Bernarda Caffey, MD   1.25 mg at 12/01/17 1324  . Bevacizumab (AVASTIN) SOLN 1.25 mg  1.25 mg Intravitreal  Bernarda Caffey, MD   1.25 mg at 12/30/17 5784  . Bevacizumab (AVASTIN) SOLN 1.25 mg  1.25 mg Intravitreal  Bernarda Caffey, MD   1.25 mg at 01/27/18 1323  . triamcinolone acetonide (TRIESENCE) 40 MG/ML subtenons injection 4 mg  4 mg Intravitreal  Bernarda Caffey, MD   4 mg at 02/24/18 1122  . triamcinolone acetonide (TRIESENCE) 40 MG/ML subtenons injection 4 mg  4 mg Intravitreal  Bernarda Caffey, MD   4 mg at 04/12/18 1304    Allergies: Oxycodone  Past Medical History:  Diagnosis Date  . Allergic rhinitis, cause unspecified 12/08/2010  . ANEMIA-IRON DEFICIENCY 01/25/2008  . Aortic regurgitation   . CAD (coronary artery disease)    a. 07/2016 NSTEMI/PCI: LM nl, LAD 70ost, 16m/d, 80d, D1 40ost, LCX 99ost/p (2.5x16 Synergy DES -  2.75), 40/56m, Om2 100 CTO, RCA  30p/m. Pt eval by CT surgery, not felt to be suitable candidate 2/2 comorbidities.  . CERVICAL RADICULOPATHY, LEFT 01/25/2008  . Chronic systolic CHF (congestive heart failure) ([place])    a. 07/2016 Echo: EF 40-45%, diff HK, sev AI, mild MVP w/ mod to sev MR, PASP 63mmHg.  . Critical lower limb ischemia   . DIABETES MELLITUS, UNCONTROLLED 05/21/2009  . Esophagitis   . ESRD on dialysis ([place])   . Foot ulcer ([place])    right lateral malleolus  . HCAP (healthcare-associated pneumonia) 06/29/2016  .  HYPERLIPIDEMIA 03/30/2007  . HYPERTENSION 01/25/2008  . Ischemic cardiomyopathy    a. 07/2016 Echo: EF 40-45%.  . Mitral regurgitation    a. 07/2016 Echo: Mild MVP w/ mod to sev MR.  [name] PVD (peripheral vascular disease) ([place])    a. s/p bilat BKA  . SECONDARY HYPERPARATHYROIDISM 05/21/2009  . Severe aortic regurgitation    a. 07/2016 Echo: Severe AI.    Past Surgical History:  Procedure Laterality Date  . AMPUTATION Right 03/15/2014   Procedure: RIGHT  LEG  BELOW KNEE AMPUTATION ;  Surgeon: [name], MD;  Location: [facility];  Service: Orthopedics;  Laterality: Right;  . AMPUTATION Left 11/29/2015   Procedure: AMPUTATION BELOW KNEE;  Surgeon: Newt Minion, MD;  Location: [facility];  Service: Orthopedics;  Laterality: Left;  . AV FISTULA PLACEMENT Left   . AV FISTULA PLACEMENT Left 09/06/2014   Procedure: ARTERIOVENOUS (AV) FISTULA CREATION-Left Brachiocephalic;  Surgeon: Conrad Mooreland, MD;  Location: [facility];  Service: Vascular;  Laterality: Left;  . CARDIAC CATHETERIZATION N/A 06/30/2016   Procedure: Left Heart Cath and Coronary Angiography;  Surgeon: Burnell Blanks, MD;  Location: [facility];  Service: Cardiovascular;  Laterality: N/A;  . CARDIAC CATHETERIZATION N/A 07/02/2016   Procedure: Coronary Stent Intervention;  Surgeon: Nelva Bush, MD;  Location: [facility];  Service: Cardiovascular;  Laterality: N/A;  . CATARACT EXTRACTION    . COLONOSCOPY     . CORONARY ANGIOPLASTY WITH STENT PLACEMENT  07/02/2016  . ESOPHAGOGASTRODUODENOSCOPY N/A 03/20/2016   Procedure: ESOPHAGOGASTRODUODENOSCOPY (EGD);  Surgeon: Milus Banister, MD;  Location: [facility];  Service: Endoscopy;  Laterality: N/A;  . EYE SURGERY Bilateral    cataracts removed, left eye still has some oil in it.  . INSERTION OF DIALYSIS CATHETER N/A 09/03/2014   Procedure: INSERTION OF DIALYSIS CATHETER RIGHT INTERNAL JUGULAR VEIN;  Surgeon: Conrad [place], MD;  Location: [facility];  Service: Vascular;  Laterality: N/A;  . PERIPHERAL VASCULAR CATHETERIZATION N/A 08/13/2015   Procedure: Abdominal Aortogram;  Surgeon: Elam Dutch, MD;  Location: [facility];  Service: Cardiovascular;  Laterality: N/A;    Family History  Problem Relation Age of Onset  . Cancer Mother        Pancreatic    Social History   Tobacco Use  . Smoking status: Never Smoker  . Smokeless tobacco: Never Used  Substance Use Topics  . Alcohol use: No    Alcohol/week: 0.0 standard drinks    Subjective:  2 day history of cough/ congestion/ sore throat and diarrhea; hoarse in office today; no chest pain, no shortness of breath; no wheezing; does have low grade fever; has been able to eat peanut butter crackers today and keep down; stomach does seem to be better today than yesterday;    Objective:  Vitals:   07/07/18 1106  BP: (!) 160/68  Pulse: 78  Temp: 99.9 F (37.7 C)  TempSrc: Oral  SpO2: 95%  Weight: 139 lb 1.9 oz (63.1 kg)  Height: 4\' 11"  (1.499 m)    General: Well developed, well nourished, in no acute distress  Skin : Warm and dry.  Head: Normocephalic and atraumatic  Eyes: Sclera and conjunctiva clear; pupils round and reactive to light; extraocular movements intact  Ears: External normal; canals clear; tympanic membranes normal  Oropharynx: Pink, supple. No suspicious lesions  Neck: Supple without thyromegaly, adenopathy  Lungs: Respirations unlabored; clear to auscultation  bilaterally without wheeze, rales, rhonchi  CVS exam: normal rate  and regular rhythm.  Neurologic: Alert and oriented; speech intact; face symmetrical; moves all extremities well; CNII-XII intact without focal deficit   Assessment:  1. Laryngitis, acute     Plan:  Suspect viral; reassurance; Rx for [employer]- use as directed; encouraged to increase fluids, rest voice as much as possible; if no improvement in symptoms in 48 hours, she will plan to fill Z-pak; otherwise, follow-up as needed.   No follow-ups on file.  No orders of the defined types were placed in this encounter.   Requested Prescriptions   Signed Prescriptions Disp Refills  . benzonatate (TESSALON) 100 MG capsule 20 capsule 0    Sig: Take 1 capsule (100 mg total) by mouth 3 (three) times daily as needed.  [name] azithromycin (ZITHROMAX) 250 MG tablet 6 tablet 0    Sig: 2 tabs po qd x 1 day; 1 tablet per day x 4 days;

## 2018-07-08 DIAGNOSIS — N2581 Secondary hyperparathyroidism of renal origin: Secondary | ICD-10-CM | POA: Diagnosis not present

## 2018-07-08 DIAGNOSIS — N186 End stage renal disease: Secondary | ICD-10-CM | POA: Diagnosis not present

## 2018-07-08 DIAGNOSIS — D509 Iron deficiency anemia, unspecified: Secondary | ICD-10-CM | POA: Diagnosis not present

## 2018-07-08 DIAGNOSIS — D631 Anemia in chronic kidney disease: Secondary | ICD-10-CM | POA: Diagnosis not present

## 2018-07-08 DIAGNOSIS — E1122 Type 2 diabetes mellitus with diabetic chronic kidney disease: Secondary | ICD-10-CM | POA: Diagnosis not present

## 2018-07-08 DIAGNOSIS — E876 Hypokalemia: Secondary | ICD-10-CM | POA: Diagnosis not present

## 2018-07-11 DIAGNOSIS — D631 Anemia in chronic kidney disease: Secondary | ICD-10-CM | POA: Diagnosis not present

## 2018-07-11 DIAGNOSIS — E1122 Type 2 diabetes mellitus with diabetic chronic kidney disease: Secondary | ICD-10-CM | POA: Diagnosis not present

## 2018-07-11 DIAGNOSIS — E876 Hypokalemia: Secondary | ICD-10-CM | POA: Diagnosis not present

## 2018-07-11 DIAGNOSIS — N2581 Secondary hyperparathyroidism of renal origin: Secondary | ICD-10-CM | POA: Diagnosis not present

## 2018-07-11 DIAGNOSIS — D509 Iron deficiency anemia, unspecified: Secondary | ICD-10-CM | POA: Diagnosis not present

## 2018-07-11 DIAGNOSIS — N186 End stage renal disease: Secondary | ICD-10-CM | POA: Diagnosis not present

## 2018-07-13 DIAGNOSIS — E1122 Type 2 diabetes mellitus with diabetic chronic kidney disease: Secondary | ICD-10-CM | POA: Diagnosis not present

## 2018-07-13 DIAGNOSIS — E876 Hypokalemia: Secondary | ICD-10-CM | POA: Diagnosis not present

## 2018-07-13 DIAGNOSIS — N186 End stage renal disease: Secondary | ICD-10-CM | POA: Diagnosis not present

## 2018-07-13 DIAGNOSIS — N2581 Secondary hyperparathyroidism of renal origin: Secondary | ICD-10-CM | POA: Diagnosis not present

## 2018-07-13 DIAGNOSIS — D509 Iron deficiency anemia, unspecified: Secondary | ICD-10-CM | POA: Diagnosis not present

## 2018-07-13 DIAGNOSIS — D631 Anemia in chronic kidney disease: Secondary | ICD-10-CM | POA: Diagnosis not present

## 2018-07-14 DIAGNOSIS — N186 End stage renal disease: Secondary | ICD-10-CM | POA: Diagnosis not present

## 2018-07-14 DIAGNOSIS — Z992 Dependence on renal dialysis: Secondary | ICD-10-CM | POA: Diagnosis not present

## 2018-07-14 DIAGNOSIS — I871 Compression of vein: Secondary | ICD-10-CM | POA: Diagnosis not present

## 2018-07-15 DIAGNOSIS — E1122 Type 2 diabetes mellitus with diabetic chronic kidney disease: Secondary | ICD-10-CM | POA: Diagnosis not present

## 2018-07-15 DIAGNOSIS — N186 End stage renal disease: Secondary | ICD-10-CM | POA: Diagnosis not present

## 2018-07-15 DIAGNOSIS — N2581 Secondary hyperparathyroidism of renal origin: Secondary | ICD-10-CM | POA: Diagnosis not present

## 2018-07-15 DIAGNOSIS — D631 Anemia in chronic kidney disease: Secondary | ICD-10-CM | POA: Diagnosis not present

## 2018-07-15 DIAGNOSIS — D509 Iron deficiency anemia, unspecified: Secondary | ICD-10-CM | POA: Diagnosis not present

## 2018-07-15 DIAGNOSIS — E876 Hypokalemia: Secondary | ICD-10-CM | POA: Diagnosis not present

## 2018-07-18 DIAGNOSIS — N2581 Secondary hyperparathyroidism of renal origin: Secondary | ICD-10-CM | POA: Diagnosis not present

## 2018-07-18 DIAGNOSIS — D631 Anemia in chronic kidney disease: Secondary | ICD-10-CM | POA: Diagnosis not present

## 2018-07-18 DIAGNOSIS — N186 End stage renal disease: Secondary | ICD-10-CM | POA: Diagnosis not present

## 2018-07-18 DIAGNOSIS — D509 Iron deficiency anemia, unspecified: Secondary | ICD-10-CM | POA: Diagnosis not present

## 2018-07-18 DIAGNOSIS — E876 Hypokalemia: Secondary | ICD-10-CM | POA: Diagnosis not present

## 2018-07-18 DIAGNOSIS — E1122 Type 2 diabetes mellitus with diabetic chronic kidney disease: Secondary | ICD-10-CM | POA: Diagnosis not present

## 2018-07-20 DIAGNOSIS — D631 Anemia in chronic kidney disease: Secondary | ICD-10-CM | POA: Diagnosis not present

## 2018-07-20 DIAGNOSIS — D509 Iron deficiency anemia, unspecified: Secondary | ICD-10-CM | POA: Diagnosis not present

## 2018-07-20 DIAGNOSIS — N2581 Secondary hyperparathyroidism of renal origin: Secondary | ICD-10-CM | POA: Diagnosis not present

## 2018-07-20 DIAGNOSIS — E1122 Type 2 diabetes mellitus with diabetic chronic kidney disease: Secondary | ICD-10-CM | POA: Diagnosis not present

## 2018-07-20 DIAGNOSIS — N186 End stage renal disease: Secondary | ICD-10-CM | POA: Diagnosis not present

## 2018-07-20 DIAGNOSIS — E876 Hypokalemia: Secondary | ICD-10-CM | POA: Diagnosis not present

## 2018-07-22 DIAGNOSIS — E876 Hypokalemia: Secondary | ICD-10-CM | POA: Diagnosis not present

## 2018-07-22 DIAGNOSIS — N2581 Secondary hyperparathyroidism of renal origin: Secondary | ICD-10-CM | POA: Diagnosis not present

## 2018-07-22 DIAGNOSIS — D631 Anemia in chronic kidney disease: Secondary | ICD-10-CM | POA: Diagnosis not present

## 2018-07-22 DIAGNOSIS — D509 Iron deficiency anemia, unspecified: Secondary | ICD-10-CM | POA: Diagnosis not present

## 2018-07-22 DIAGNOSIS — N186 End stage renal disease: Secondary | ICD-10-CM | POA: Diagnosis not present

## 2018-07-22 DIAGNOSIS — E1122 Type 2 diabetes mellitus with diabetic chronic kidney disease: Secondary | ICD-10-CM | POA: Diagnosis not present

## 2018-07-24 DIAGNOSIS — D631 Anemia in chronic kidney disease: Secondary | ICD-10-CM | POA: Diagnosis not present

## 2018-07-24 DIAGNOSIS — E876 Hypokalemia: Secondary | ICD-10-CM | POA: Diagnosis not present

## 2018-07-24 DIAGNOSIS — N2581 Secondary hyperparathyroidism of renal origin: Secondary | ICD-10-CM | POA: Diagnosis not present

## 2018-07-24 DIAGNOSIS — D509 Iron deficiency anemia, unspecified: Secondary | ICD-10-CM | POA: Diagnosis not present

## 2018-07-24 DIAGNOSIS — N186 End stage renal disease: Secondary | ICD-10-CM | POA: Diagnosis not present

## 2018-07-24 DIAGNOSIS — E1122 Type 2 diabetes mellitus with diabetic chronic kidney disease: Secondary | ICD-10-CM | POA: Diagnosis not present

## 2018-07-26 DIAGNOSIS — E876 Hypokalemia: Secondary | ICD-10-CM | POA: Diagnosis not present

## 2018-07-26 DIAGNOSIS — N186 End stage renal disease: Secondary | ICD-10-CM | POA: Diagnosis not present

## 2018-07-26 DIAGNOSIS — N2581 Secondary hyperparathyroidism of renal origin: Secondary | ICD-10-CM | POA: Diagnosis not present

## 2018-07-26 DIAGNOSIS — D631 Anemia in chronic kidney disease: Secondary | ICD-10-CM | POA: Diagnosis not present

## 2018-07-26 DIAGNOSIS — D509 Iron deficiency anemia, unspecified: Secondary | ICD-10-CM | POA: Diagnosis not present

## 2018-07-26 DIAGNOSIS — E1122 Type 2 diabetes mellitus with diabetic chronic kidney disease: Secondary | ICD-10-CM | POA: Diagnosis not present

## 2018-07-29 DIAGNOSIS — N186 End stage renal disease: Secondary | ICD-10-CM | POA: Diagnosis not present

## 2018-07-29 DIAGNOSIS — E876 Hypokalemia: Secondary | ICD-10-CM | POA: Diagnosis not present

## 2018-07-29 DIAGNOSIS — D509 Iron deficiency anemia, unspecified: Secondary | ICD-10-CM | POA: Diagnosis not present

## 2018-07-29 DIAGNOSIS — N2581 Secondary hyperparathyroidism of renal origin: Secondary | ICD-10-CM | POA: Diagnosis not present

## 2018-07-29 DIAGNOSIS — E1122 Type 2 diabetes mellitus with diabetic chronic kidney disease: Secondary | ICD-10-CM | POA: Diagnosis not present

## 2018-07-29 DIAGNOSIS — D631 Anemia in chronic kidney disease: Secondary | ICD-10-CM | POA: Diagnosis not present

## 2018-07-31 DIAGNOSIS — Z992 Dependence on renal dialysis: Secondary | ICD-10-CM | POA: Diagnosis not present

## 2018-07-31 DIAGNOSIS — N186 End stage renal disease: Secondary | ICD-10-CM | POA: Diagnosis not present

## 2018-07-31 DIAGNOSIS — E1129 Type 2 diabetes mellitus with other diabetic kidney complication: Secondary | ICD-10-CM | POA: Diagnosis not present

## 2018-08-01 DIAGNOSIS — D509 Iron deficiency anemia, unspecified: Secondary | ICD-10-CM | POA: Diagnosis not present

## 2018-08-01 DIAGNOSIS — N186 End stage renal disease: Secondary | ICD-10-CM | POA: Diagnosis not present

## 2018-08-01 DIAGNOSIS — D631 Anemia in chronic kidney disease: Secondary | ICD-10-CM | POA: Diagnosis not present

## 2018-08-01 DIAGNOSIS — E876 Hypokalemia: Secondary | ICD-10-CM | POA: Diagnosis not present

## 2018-08-01 DIAGNOSIS — N2581 Secondary hyperparathyroidism of renal origin: Secondary | ICD-10-CM | POA: Diagnosis not present

## 2018-08-01 DIAGNOSIS — E1122 Type 2 diabetes mellitus with diabetic chronic kidney disease: Secondary | ICD-10-CM | POA: Diagnosis not present

## 2018-08-03 DIAGNOSIS — E876 Hypokalemia: Secondary | ICD-10-CM | POA: Diagnosis not present

## 2018-08-03 DIAGNOSIS — N186 End stage renal disease: Secondary | ICD-10-CM | POA: Diagnosis not present

## 2018-08-03 DIAGNOSIS — D631 Anemia in chronic kidney disease: Secondary | ICD-10-CM | POA: Diagnosis not present

## 2018-08-03 DIAGNOSIS — N2581 Secondary hyperparathyroidism of renal origin: Secondary | ICD-10-CM | POA: Diagnosis not present

## 2018-08-03 DIAGNOSIS — E1122 Type 2 diabetes mellitus with diabetic chronic kidney disease: Secondary | ICD-10-CM | POA: Diagnosis not present

## 2018-08-03 DIAGNOSIS — D509 Iron deficiency anemia, unspecified: Secondary | ICD-10-CM | POA: Diagnosis not present

## 2018-08-05 DIAGNOSIS — N186 End stage renal disease: Secondary | ICD-10-CM | POA: Diagnosis not present

## 2018-08-05 DIAGNOSIS — E876 Hypokalemia: Secondary | ICD-10-CM | POA: Diagnosis not present

## 2018-08-05 DIAGNOSIS — E1122 Type 2 diabetes mellitus with diabetic chronic kidney disease: Secondary | ICD-10-CM | POA: Diagnosis not present

## 2018-08-05 DIAGNOSIS — D631 Anemia in chronic kidney disease: Secondary | ICD-10-CM | POA: Diagnosis not present

## 2018-08-05 DIAGNOSIS — N2581 Secondary hyperparathyroidism of renal origin: Secondary | ICD-10-CM | POA: Diagnosis not present

## 2018-08-05 DIAGNOSIS — D509 Iron deficiency anemia, unspecified: Secondary | ICD-10-CM | POA: Diagnosis not present

## 2018-08-08 DIAGNOSIS — E1122 Type 2 diabetes mellitus with diabetic chronic kidney disease: Secondary | ICD-10-CM | POA: Diagnosis not present

## 2018-08-08 DIAGNOSIS — N2581 Secondary hyperparathyroidism of renal origin: Secondary | ICD-10-CM | POA: Diagnosis not present

## 2018-08-08 DIAGNOSIS — N186 End stage renal disease: Secondary | ICD-10-CM | POA: Diagnosis not present

## 2018-08-08 DIAGNOSIS — D509 Iron deficiency anemia, unspecified: Secondary | ICD-10-CM | POA: Diagnosis not present

## 2018-08-08 DIAGNOSIS — E876 Hypokalemia: Secondary | ICD-10-CM | POA: Diagnosis not present

## 2018-08-08 DIAGNOSIS — D631 Anemia in chronic kidney disease: Secondary | ICD-10-CM | POA: Diagnosis not present

## 2018-08-10 DIAGNOSIS — D509 Iron deficiency anemia, unspecified: Secondary | ICD-10-CM | POA: Diagnosis not present

## 2018-08-10 DIAGNOSIS — D631 Anemia in chronic kidney disease: Secondary | ICD-10-CM | POA: Diagnosis not present

## 2018-08-10 DIAGNOSIS — E876 Hypokalemia: Secondary | ICD-10-CM | POA: Diagnosis not present

## 2018-08-10 DIAGNOSIS — N186 End stage renal disease: Secondary | ICD-10-CM | POA: Diagnosis not present

## 2018-08-10 DIAGNOSIS — E1122 Type 2 diabetes mellitus with diabetic chronic kidney disease: Secondary | ICD-10-CM | POA: Diagnosis not present

## 2018-08-10 DIAGNOSIS — N2581 Secondary hyperparathyroidism of renal origin: Secondary | ICD-10-CM | POA: Diagnosis not present

## 2018-08-12 DIAGNOSIS — N186 End stage renal disease: Secondary | ICD-10-CM | POA: Diagnosis not present

## 2018-08-12 DIAGNOSIS — D631 Anemia in chronic kidney disease: Secondary | ICD-10-CM | POA: Diagnosis not present

## 2018-08-12 DIAGNOSIS — D509 Iron deficiency anemia, unspecified: Secondary | ICD-10-CM | POA: Diagnosis not present

## 2018-08-12 DIAGNOSIS — N2581 Secondary hyperparathyroidism of renal origin: Secondary | ICD-10-CM | POA: Diagnosis not present

## 2018-08-12 DIAGNOSIS — E876 Hypokalemia: Secondary | ICD-10-CM | POA: Diagnosis not present

## 2018-08-12 DIAGNOSIS — E1122 Type 2 diabetes mellitus with diabetic chronic kidney disease: Secondary | ICD-10-CM | POA: Diagnosis not present

## 2018-08-15 DIAGNOSIS — E876 Hypokalemia: Secondary | ICD-10-CM | POA: Diagnosis not present

## 2018-08-15 DIAGNOSIS — D509 Iron deficiency anemia, unspecified: Secondary | ICD-10-CM | POA: Diagnosis not present

## 2018-08-15 DIAGNOSIS — N186 End stage renal disease: Secondary | ICD-10-CM | POA: Diagnosis not present

## 2018-08-15 DIAGNOSIS — E1122 Type 2 diabetes mellitus with diabetic chronic kidney disease: Secondary | ICD-10-CM | POA: Diagnosis not present

## 2018-08-15 DIAGNOSIS — N2581 Secondary hyperparathyroidism of renal origin: Secondary | ICD-10-CM | POA: Diagnosis not present

## 2018-08-15 DIAGNOSIS — D631 Anemia in chronic kidney disease: Secondary | ICD-10-CM | POA: Diagnosis not present

## 2018-08-17 DIAGNOSIS — N186 End stage renal disease: Secondary | ICD-10-CM | POA: Diagnosis not present

## 2018-08-17 DIAGNOSIS — D509 Iron deficiency anemia, unspecified: Secondary | ICD-10-CM | POA: Diagnosis not present

## 2018-08-17 DIAGNOSIS — E1122 Type 2 diabetes mellitus with diabetic chronic kidney disease: Secondary | ICD-10-CM | POA: Diagnosis not present

## 2018-08-17 DIAGNOSIS — E876 Hypokalemia: Secondary | ICD-10-CM | POA: Diagnosis not present

## 2018-08-17 DIAGNOSIS — D631 Anemia in chronic kidney disease: Secondary | ICD-10-CM | POA: Diagnosis not present

## 2018-08-17 DIAGNOSIS — N2581 Secondary hyperparathyroidism of renal origin: Secondary | ICD-10-CM | POA: Diagnosis not present

## 2018-08-19 DIAGNOSIS — N2581 Secondary hyperparathyroidism of renal origin: Secondary | ICD-10-CM | POA: Diagnosis not present

## 2018-08-19 DIAGNOSIS — E1122 Type 2 diabetes mellitus with diabetic chronic kidney disease: Secondary | ICD-10-CM | POA: Diagnosis not present

## 2018-08-19 DIAGNOSIS — D509 Iron deficiency anemia, unspecified: Secondary | ICD-10-CM | POA: Diagnosis not present

## 2018-08-19 DIAGNOSIS — E876 Hypokalemia: Secondary | ICD-10-CM | POA: Diagnosis not present

## 2018-08-19 DIAGNOSIS — N186 End stage renal disease: Secondary | ICD-10-CM | POA: Diagnosis not present

## 2018-08-19 DIAGNOSIS — D631 Anemia in chronic kidney disease: Secondary | ICD-10-CM | POA: Diagnosis not present

## 2018-08-21 DIAGNOSIS — N2581 Secondary hyperparathyroidism of renal origin: Secondary | ICD-10-CM | POA: Diagnosis not present

## 2018-08-21 DIAGNOSIS — D631 Anemia in chronic kidney disease: Secondary | ICD-10-CM | POA: Diagnosis not present

## 2018-08-21 DIAGNOSIS — N186 End stage renal disease: Secondary | ICD-10-CM | POA: Diagnosis not present

## 2018-08-21 DIAGNOSIS — D509 Iron deficiency anemia, unspecified: Secondary | ICD-10-CM | POA: Diagnosis not present

## 2018-08-21 DIAGNOSIS — E876 Hypokalemia: Secondary | ICD-10-CM | POA: Diagnosis not present

## 2018-08-21 DIAGNOSIS — E1122 Type 2 diabetes mellitus with diabetic chronic kidney disease: Secondary | ICD-10-CM | POA: Diagnosis not present

## 2018-08-23 DIAGNOSIS — N2581 Secondary hyperparathyroidism of renal origin: Secondary | ICD-10-CM | POA: Diagnosis not present

## 2018-08-23 DIAGNOSIS — N186 End stage renal disease: Secondary | ICD-10-CM | POA: Diagnosis not present

## 2018-08-23 DIAGNOSIS — E1122 Type 2 diabetes mellitus with diabetic chronic kidney disease: Secondary | ICD-10-CM | POA: Diagnosis not present

## 2018-08-23 DIAGNOSIS — D509 Iron deficiency anemia, unspecified: Secondary | ICD-10-CM | POA: Diagnosis not present

## 2018-08-23 DIAGNOSIS — D631 Anemia in chronic kidney disease: Secondary | ICD-10-CM | POA: Diagnosis not present

## 2018-08-23 DIAGNOSIS — E876 Hypokalemia: Secondary | ICD-10-CM | POA: Diagnosis not present

## 2018-08-26 DIAGNOSIS — D509 Iron deficiency anemia, unspecified: Secondary | ICD-10-CM | POA: Diagnosis not present

## 2018-08-26 DIAGNOSIS — E876 Hypokalemia: Secondary | ICD-10-CM | POA: Diagnosis not present

## 2018-08-26 DIAGNOSIS — N2581 Secondary hyperparathyroidism of renal origin: Secondary | ICD-10-CM | POA: Diagnosis not present

## 2018-08-26 DIAGNOSIS — E1122 Type 2 diabetes mellitus with diabetic chronic kidney disease: Secondary | ICD-10-CM | POA: Diagnosis not present

## 2018-08-26 DIAGNOSIS — D631 Anemia in chronic kidney disease: Secondary | ICD-10-CM | POA: Diagnosis not present

## 2018-08-26 DIAGNOSIS — N186 End stage renal disease: Secondary | ICD-10-CM | POA: Diagnosis not present

## 2018-08-28 DIAGNOSIS — N2581 Secondary hyperparathyroidism of renal origin: Secondary | ICD-10-CM | POA: Diagnosis not present

## 2018-08-28 DIAGNOSIS — D631 Anemia in chronic kidney disease: Secondary | ICD-10-CM | POA: Diagnosis not present

## 2018-08-28 DIAGNOSIS — N186 End stage renal disease: Secondary | ICD-10-CM | POA: Diagnosis not present

## 2018-08-28 DIAGNOSIS — E876 Hypokalemia: Secondary | ICD-10-CM | POA: Diagnosis not present

## 2018-08-28 DIAGNOSIS — D509 Iron deficiency anemia, unspecified: Secondary | ICD-10-CM | POA: Diagnosis not present

## 2018-08-28 DIAGNOSIS — E1122 Type 2 diabetes mellitus with diabetic chronic kidney disease: Secondary | ICD-10-CM | POA: Diagnosis not present

## 2018-08-30 DIAGNOSIS — E1122 Type 2 diabetes mellitus with diabetic chronic kidney disease: Secondary | ICD-10-CM | POA: Diagnosis not present

## 2018-08-30 DIAGNOSIS — D509 Iron deficiency anemia, unspecified: Secondary | ICD-10-CM | POA: Diagnosis not present

## 2018-08-30 DIAGNOSIS — N186 End stage renal disease: Secondary | ICD-10-CM | POA: Diagnosis not present

## 2018-08-30 DIAGNOSIS — N2581 Secondary hyperparathyroidism of renal origin: Secondary | ICD-10-CM | POA: Diagnosis not present

## 2018-08-30 DIAGNOSIS — D631 Anemia in chronic kidney disease: Secondary | ICD-10-CM | POA: Diagnosis not present

## 2018-08-30 DIAGNOSIS — E876 Hypokalemia: Secondary | ICD-10-CM | POA: Diagnosis not present

## 2018-08-31 DIAGNOSIS — E1129 Type 2 diabetes mellitus with other diabetic kidney complication: Secondary | ICD-10-CM | POA: Diagnosis not present

## 2018-08-31 DIAGNOSIS — Z992 Dependence on renal dialysis: Secondary | ICD-10-CM | POA: Diagnosis not present

## 2018-08-31 DIAGNOSIS — N186 End stage renal disease: Secondary | ICD-10-CM | POA: Diagnosis not present

## 2018-09-02 DIAGNOSIS — N186 End stage renal disease: Secondary | ICD-10-CM | POA: Diagnosis not present

## 2018-09-02 DIAGNOSIS — D631 Anemia in chronic kidney disease: Secondary | ICD-10-CM | POA: Diagnosis not present

## 2018-09-02 DIAGNOSIS — E876 Hypokalemia: Secondary | ICD-10-CM | POA: Diagnosis not present

## 2018-09-02 DIAGNOSIS — N2581 Secondary hyperparathyroidism of renal origin: Secondary | ICD-10-CM | POA: Diagnosis not present

## 2018-09-02 DIAGNOSIS — E1122 Type 2 diabetes mellitus with diabetic chronic kidney disease: Secondary | ICD-10-CM | POA: Diagnosis not present

## 2018-09-05 DIAGNOSIS — N186 End stage renal disease: Secondary | ICD-10-CM | POA: Diagnosis not present

## 2018-09-05 DIAGNOSIS — E876 Hypokalemia: Secondary | ICD-10-CM | POA: Diagnosis not present

## 2018-09-05 DIAGNOSIS — D631 Anemia in chronic kidney disease: Secondary | ICD-10-CM | POA: Diagnosis not present

## 2018-09-05 DIAGNOSIS — N2581 Secondary hyperparathyroidism of renal origin: Secondary | ICD-10-CM | POA: Diagnosis not present

## 2018-09-05 DIAGNOSIS — E1122 Type 2 diabetes mellitus with diabetic chronic kidney disease: Secondary | ICD-10-CM | POA: Diagnosis not present

## 2018-09-07 DIAGNOSIS — E876 Hypokalemia: Secondary | ICD-10-CM | POA: Diagnosis not present

## 2018-09-07 DIAGNOSIS — D631 Anemia in chronic kidney disease: Secondary | ICD-10-CM | POA: Diagnosis not present

## 2018-09-07 DIAGNOSIS — N2581 Secondary hyperparathyroidism of renal origin: Secondary | ICD-10-CM | POA: Diagnosis not present

## 2018-09-07 DIAGNOSIS — E1122 Type 2 diabetes mellitus with diabetic chronic kidney disease: Secondary | ICD-10-CM | POA: Diagnosis not present

## 2018-09-07 DIAGNOSIS — N186 End stage renal disease: Secondary | ICD-10-CM | POA: Diagnosis not present

## 2018-09-09 DIAGNOSIS — E876 Hypokalemia: Secondary | ICD-10-CM | POA: Diagnosis not present

## 2018-09-09 DIAGNOSIS — N186 End stage renal disease: Secondary | ICD-10-CM | POA: Diagnosis not present

## 2018-09-09 DIAGNOSIS — E1122 Type 2 diabetes mellitus with diabetic chronic kidney disease: Secondary | ICD-10-CM | POA: Diagnosis not present

## 2018-09-09 DIAGNOSIS — D631 Anemia in chronic kidney disease: Secondary | ICD-10-CM | POA: Diagnosis not present

## 2018-09-09 DIAGNOSIS — N2581 Secondary hyperparathyroidism of renal origin: Secondary | ICD-10-CM | POA: Diagnosis not present

## 2018-09-12 ENCOUNTER — Telehealth: Payer: Self-pay

## 2018-09-12 DIAGNOSIS — N2581 Secondary hyperparathyroidism of renal origin: Secondary | ICD-10-CM | POA: Diagnosis not present

## 2018-09-12 DIAGNOSIS — N186 End stage renal disease: Secondary | ICD-10-CM | POA: Diagnosis not present

## 2018-09-12 DIAGNOSIS — E1122 Type 2 diabetes mellitus with diabetic chronic kidney disease: Secondary | ICD-10-CM | POA: Diagnosis not present

## 2018-09-12 DIAGNOSIS — D631 Anemia in chronic kidney disease: Secondary | ICD-10-CM | POA: Diagnosis not present

## 2018-09-12 DIAGNOSIS — E876 Hypokalemia: Secondary | ICD-10-CM | POA: Diagnosis not present

## 2018-09-12 NOTE — Telephone Encounter (Signed)
Pt dropped off [facility] script to be filled out by PCP and faxed back.   It has been completed and faxed.  Script send to scan for records.

## 2018-09-14 DIAGNOSIS — N2581 Secondary hyperparathyroidism of renal origin: Secondary | ICD-10-CM | POA: Diagnosis not present

## 2018-09-14 DIAGNOSIS — E876 Hypokalemia: Secondary | ICD-10-CM | POA: Diagnosis not present

## 2018-09-14 DIAGNOSIS — E1122 Type 2 diabetes mellitus with diabetic chronic kidney disease: Secondary | ICD-10-CM | POA: Diagnosis not present

## 2018-09-14 DIAGNOSIS — N186 End stage renal disease: Secondary | ICD-10-CM | POA: Diagnosis not present

## 2018-09-14 DIAGNOSIS — D631 Anemia in chronic kidney disease: Secondary | ICD-10-CM | POA: Diagnosis not present

## 2018-09-15 NOTE — Telephone Encounter (Signed)
Sharyn Lull from [facility] called and stated they faxed over a LMN form and an Rx for prosthetic supplies. Please confirm if received and when they will be signed off and faxed back

## 2018-09-15 NOTE — Telephone Encounter (Signed)
Forms have been received and faxed.

## 2018-09-16 DIAGNOSIS — E876 Hypokalemia: Secondary | ICD-10-CM | POA: Diagnosis not present

## 2018-09-16 DIAGNOSIS — N186 End stage renal disease: Secondary | ICD-10-CM | POA: Diagnosis not present

## 2018-09-16 DIAGNOSIS — N2581 Secondary hyperparathyroidism of renal origin: Secondary | ICD-10-CM | POA: Diagnosis not present

## 2018-09-16 DIAGNOSIS — E1122 Type 2 diabetes mellitus with diabetic chronic kidney disease: Secondary | ICD-10-CM | POA: Diagnosis not present

## 2018-09-16 DIAGNOSIS — D631 Anemia in chronic kidney disease: Secondary | ICD-10-CM | POA: Diagnosis not present

## 2018-09-19 DIAGNOSIS — D631 Anemia in chronic kidney disease: Secondary | ICD-10-CM | POA: Diagnosis not present

## 2018-09-19 DIAGNOSIS — E876 Hypokalemia: Secondary | ICD-10-CM | POA: Diagnosis not present

## 2018-09-19 DIAGNOSIS — N186 End stage renal disease: Secondary | ICD-10-CM | POA: Diagnosis not present

## 2018-09-19 DIAGNOSIS — N2581 Secondary hyperparathyroidism of renal origin: Secondary | ICD-10-CM | POA: Diagnosis not present

## 2018-09-19 DIAGNOSIS — E1122 Type 2 diabetes mellitus with diabetic chronic kidney disease: Secondary | ICD-10-CM | POA: Diagnosis not present

## 2018-09-21 DIAGNOSIS — D631 Anemia in chronic kidney disease: Secondary | ICD-10-CM | POA: Diagnosis not present

## 2018-09-21 DIAGNOSIS — N186 End stage renal disease: Secondary | ICD-10-CM | POA: Diagnosis not present

## 2018-09-21 DIAGNOSIS — E1122 Type 2 diabetes mellitus with diabetic chronic kidney disease: Secondary | ICD-10-CM | POA: Diagnosis not present

## 2018-09-21 DIAGNOSIS — N2581 Secondary hyperparathyroidism of renal origin: Secondary | ICD-10-CM | POA: Diagnosis not present

## 2018-09-21 DIAGNOSIS — E876 Hypokalemia: Secondary | ICD-10-CM | POA: Diagnosis not present

## 2018-09-23 DIAGNOSIS — E876 Hypokalemia: Secondary | ICD-10-CM | POA: Diagnosis not present

## 2018-09-23 DIAGNOSIS — E1122 Type 2 diabetes mellitus with diabetic chronic kidney disease: Secondary | ICD-10-CM | POA: Diagnosis not present

## 2018-09-23 DIAGNOSIS — D631 Anemia in chronic kidney disease: Secondary | ICD-10-CM | POA: Diagnosis not present

## 2018-09-23 DIAGNOSIS — N2581 Secondary hyperparathyroidism of renal origin: Secondary | ICD-10-CM | POA: Diagnosis not present

## 2018-09-23 DIAGNOSIS — N186 End stage renal disease: Secondary | ICD-10-CM | POA: Diagnosis not present

## 2018-09-25 ENCOUNTER — Other Ambulatory Visit: Payer: Self-pay | Admitting: Cardiovascular Disease

## 2018-09-26 DIAGNOSIS — N2581 Secondary hyperparathyroidism of renal origin: Secondary | ICD-10-CM | POA: Diagnosis not present

## 2018-09-26 DIAGNOSIS — D631 Anemia in chronic kidney disease: Secondary | ICD-10-CM | POA: Diagnosis not present

## 2018-09-26 DIAGNOSIS — N186 End stage renal disease: Secondary | ICD-10-CM | POA: Diagnosis not present

## 2018-09-26 DIAGNOSIS — E876 Hypokalemia: Secondary | ICD-10-CM | POA: Diagnosis not present

## 2018-09-26 DIAGNOSIS — E1122 Type 2 diabetes mellitus with diabetic chronic kidney disease: Secondary | ICD-10-CM | POA: Diagnosis not present

## 2018-09-28 DIAGNOSIS — E1122 Type 2 diabetes mellitus with diabetic chronic kidney disease: Secondary | ICD-10-CM | POA: Diagnosis not present

## 2018-09-28 DIAGNOSIS — D631 Anemia in chronic kidney disease: Secondary | ICD-10-CM | POA: Diagnosis not present

## 2018-09-28 DIAGNOSIS — E876 Hypokalemia: Secondary | ICD-10-CM | POA: Diagnosis not present

## 2018-09-28 DIAGNOSIS — N186 End stage renal disease: Secondary | ICD-10-CM | POA: Diagnosis not present

## 2018-09-28 DIAGNOSIS — N2581 Secondary hyperparathyroidism of renal origin: Secondary | ICD-10-CM | POA: Diagnosis not present

## 2018-09-30 DIAGNOSIS — N186 End stage renal disease: Secondary | ICD-10-CM | POA: Diagnosis not present

## 2018-09-30 DIAGNOSIS — E1122 Type 2 diabetes mellitus with diabetic chronic kidney disease: Secondary | ICD-10-CM | POA: Diagnosis not present

## 2018-09-30 DIAGNOSIS — D631 Anemia in chronic kidney disease: Secondary | ICD-10-CM | POA: Diagnosis not present

## 2018-09-30 DIAGNOSIS — N2581 Secondary hyperparathyroidism of renal origin: Secondary | ICD-10-CM | POA: Diagnosis not present

## 2018-09-30 DIAGNOSIS — E876 Hypokalemia: Secondary | ICD-10-CM | POA: Diagnosis not present

## 2018-10-01 DIAGNOSIS — N186 End stage renal disease: Secondary | ICD-10-CM | POA: Diagnosis not present

## 2018-10-01 DIAGNOSIS — E1129 Type 2 diabetes mellitus with other diabetic kidney complication: Secondary | ICD-10-CM | POA: Diagnosis not present

## 2018-10-01 DIAGNOSIS — Z992 Dependence on renal dialysis: Secondary | ICD-10-CM | POA: Diagnosis not present

## 2018-10-03 DIAGNOSIS — E876 Hypokalemia: Secondary | ICD-10-CM | POA: Diagnosis not present

## 2018-10-03 DIAGNOSIS — N186 End stage renal disease: Secondary | ICD-10-CM | POA: Diagnosis not present

## 2018-10-03 DIAGNOSIS — N2581 Secondary hyperparathyroidism of renal origin: Secondary | ICD-10-CM | POA: Diagnosis not present

## 2018-10-03 DIAGNOSIS — E1122 Type 2 diabetes mellitus with diabetic chronic kidney disease: Secondary | ICD-10-CM | POA: Diagnosis not present

## 2018-10-03 DIAGNOSIS — D631 Anemia in chronic kidney disease: Secondary | ICD-10-CM | POA: Diagnosis not present

## 2018-10-05 DIAGNOSIS — E876 Hypokalemia: Secondary | ICD-10-CM | POA: Diagnosis not present

## 2018-10-05 DIAGNOSIS — E1122 Type 2 diabetes mellitus with diabetic chronic kidney disease: Secondary | ICD-10-CM | POA: Diagnosis not present

## 2018-10-05 DIAGNOSIS — D631 Anemia in chronic kidney disease: Secondary | ICD-10-CM | POA: Diagnosis not present

## 2018-10-05 DIAGNOSIS — N186 End stage renal disease: Secondary | ICD-10-CM | POA: Diagnosis not present

## 2018-10-05 DIAGNOSIS — N2581 Secondary hyperparathyroidism of renal origin: Secondary | ICD-10-CM | POA: Diagnosis not present

## 2018-10-07 ENCOUNTER — Other Ambulatory Visit: Payer: Self-pay | Admitting: Internal Medicine

## 2018-10-07 DIAGNOSIS — E876 Hypokalemia: Secondary | ICD-10-CM | POA: Diagnosis not present

## 2018-10-07 DIAGNOSIS — D631 Anemia in chronic kidney disease: Secondary | ICD-10-CM | POA: Diagnosis not present

## 2018-10-07 DIAGNOSIS — N186 End stage renal disease: Secondary | ICD-10-CM | POA: Diagnosis not present

## 2018-10-07 DIAGNOSIS — N2581 Secondary hyperparathyroidism of renal origin: Secondary | ICD-10-CM | POA: Diagnosis not present

## 2018-10-07 DIAGNOSIS — E1122 Type 2 diabetes mellitus with diabetic chronic kidney disease: Secondary | ICD-10-CM | POA: Diagnosis not present

## 2018-10-10 DIAGNOSIS — D631 Anemia in chronic kidney disease: Secondary | ICD-10-CM | POA: Diagnosis not present

## 2018-10-10 DIAGNOSIS — E1122 Type 2 diabetes mellitus with diabetic chronic kidney disease: Secondary | ICD-10-CM | POA: Diagnosis not present

## 2018-10-10 DIAGNOSIS — N2581 Secondary hyperparathyroidism of renal origin: Secondary | ICD-10-CM | POA: Diagnosis not present

## 2018-10-10 DIAGNOSIS — E876 Hypokalemia: Secondary | ICD-10-CM | POA: Diagnosis not present

## 2018-10-10 DIAGNOSIS — N186 End stage renal disease: Secondary | ICD-10-CM | POA: Diagnosis not present

## 2018-10-12 DIAGNOSIS — N2581 Secondary hyperparathyroidism of renal origin: Secondary | ICD-10-CM | POA: Diagnosis not present

## 2018-10-12 DIAGNOSIS — N186 End stage renal disease: Secondary | ICD-10-CM | POA: Diagnosis not present

## 2018-10-12 DIAGNOSIS — D631 Anemia in chronic kidney disease: Secondary | ICD-10-CM | POA: Diagnosis not present

## 2018-10-12 DIAGNOSIS — E1122 Type 2 diabetes mellitus with diabetic chronic kidney disease: Secondary | ICD-10-CM | POA: Diagnosis not present

## 2018-10-12 DIAGNOSIS — E876 Hypokalemia: Secondary | ICD-10-CM | POA: Diagnosis not present

## 2018-10-13 ENCOUNTER — Ambulatory Visit: Payer: Medicare Other | Admitting: Internal Medicine

## 2018-10-14 DIAGNOSIS — E876 Hypokalemia: Secondary | ICD-10-CM | POA: Diagnosis not present

## 2018-10-14 DIAGNOSIS — D631 Anemia in chronic kidney disease: Secondary | ICD-10-CM | POA: Diagnosis not present

## 2018-10-14 DIAGNOSIS — N2581 Secondary hyperparathyroidism of renal origin: Secondary | ICD-10-CM | POA: Diagnosis not present

## 2018-10-14 DIAGNOSIS — N186 End stage renal disease: Secondary | ICD-10-CM | POA: Diagnosis not present

## 2018-10-14 DIAGNOSIS — E1122 Type 2 diabetes mellitus with diabetic chronic kidney disease: Secondary | ICD-10-CM | POA: Diagnosis not present

## 2018-10-17 DIAGNOSIS — N2581 Secondary hyperparathyroidism of renal origin: Secondary | ICD-10-CM | POA: Diagnosis not present

## 2018-10-17 DIAGNOSIS — E1122 Type 2 diabetes mellitus with diabetic chronic kidney disease: Secondary | ICD-10-CM | POA: Diagnosis not present

## 2018-10-17 DIAGNOSIS — N186 End stage renal disease: Secondary | ICD-10-CM | POA: Diagnosis not present

## 2018-10-17 DIAGNOSIS — D631 Anemia in chronic kidney disease: Secondary | ICD-10-CM | POA: Diagnosis not present

## 2018-10-17 DIAGNOSIS — E876 Hypokalemia: Secondary | ICD-10-CM | POA: Diagnosis not present

## 2018-10-18 DIAGNOSIS — I871 Compression of vein: Secondary | ICD-10-CM | POA: Diagnosis not present

## 2018-10-18 DIAGNOSIS — Z992 Dependence on renal dialysis: Secondary | ICD-10-CM | POA: Diagnosis not present

## 2018-10-18 DIAGNOSIS — T82858A Stenosis of vascular prosthetic devices, implants and grafts, initial encounter: Secondary | ICD-10-CM | POA: Diagnosis not present

## 2018-10-18 DIAGNOSIS — N186 End stage renal disease: Secondary | ICD-10-CM | POA: Diagnosis not present

## 2018-10-19 DIAGNOSIS — N2581 Secondary hyperparathyroidism of renal origin: Secondary | ICD-10-CM | POA: Diagnosis not present

## 2018-10-19 DIAGNOSIS — D631 Anemia in chronic kidney disease: Secondary | ICD-10-CM | POA: Diagnosis not present

## 2018-10-19 DIAGNOSIS — E1122 Type 2 diabetes mellitus with diabetic chronic kidney disease: Secondary | ICD-10-CM | POA: Diagnosis not present

## 2018-10-19 DIAGNOSIS — E876 Hypokalemia: Secondary | ICD-10-CM | POA: Diagnosis not present

## 2018-10-19 DIAGNOSIS — N186 End stage renal disease: Secondary | ICD-10-CM | POA: Diagnosis not present

## 2018-10-21 DIAGNOSIS — N2581 Secondary hyperparathyroidism of renal origin: Secondary | ICD-10-CM | POA: Diagnosis not present

## 2018-10-21 DIAGNOSIS — E1122 Type 2 diabetes mellitus with diabetic chronic kidney disease: Secondary | ICD-10-CM | POA: Diagnosis not present

## 2018-10-21 DIAGNOSIS — D631 Anemia in chronic kidney disease: Secondary | ICD-10-CM | POA: Diagnosis not present

## 2018-10-21 DIAGNOSIS — N186 End stage renal disease: Secondary | ICD-10-CM | POA: Diagnosis not present

## 2018-10-21 DIAGNOSIS — E876 Hypokalemia: Secondary | ICD-10-CM | POA: Diagnosis not present

## 2018-10-24 DIAGNOSIS — N2581 Secondary hyperparathyroidism of renal origin: Secondary | ICD-10-CM | POA: Diagnosis not present

## 2018-10-24 DIAGNOSIS — D631 Anemia in chronic kidney disease: Secondary | ICD-10-CM | POA: Diagnosis not present

## 2018-10-24 DIAGNOSIS — N186 End stage renal disease: Secondary | ICD-10-CM | POA: Diagnosis not present

## 2018-10-24 DIAGNOSIS — E876 Hypokalemia: Secondary | ICD-10-CM | POA: Diagnosis not present

## 2018-10-24 DIAGNOSIS — E1122 Type 2 diabetes mellitus with diabetic chronic kidney disease: Secondary | ICD-10-CM | POA: Diagnosis not present

## 2018-10-25 ENCOUNTER — Other Ambulatory Visit: Payer: Self-pay | Admitting: Cardiovascular Disease

## 2018-10-26 DIAGNOSIS — N2581 Secondary hyperparathyroidism of renal origin: Secondary | ICD-10-CM | POA: Diagnosis not present

## 2018-10-26 DIAGNOSIS — D631 Anemia in chronic kidney disease: Secondary | ICD-10-CM | POA: Diagnosis not present

## 2018-10-26 DIAGNOSIS — N186 End stage renal disease: Secondary | ICD-10-CM | POA: Diagnosis not present

## 2018-10-26 DIAGNOSIS — E876 Hypokalemia: Secondary | ICD-10-CM | POA: Diagnosis not present

## 2018-10-26 DIAGNOSIS — E1122 Type 2 diabetes mellitus with diabetic chronic kidney disease: Secondary | ICD-10-CM | POA: Diagnosis not present

## 2018-10-28 DIAGNOSIS — D631 Anemia in chronic kidney disease: Secondary | ICD-10-CM | POA: Diagnosis not present

## 2018-10-28 DIAGNOSIS — N186 End stage renal disease: Secondary | ICD-10-CM | POA: Diagnosis not present

## 2018-10-28 DIAGNOSIS — E876 Hypokalemia: Secondary | ICD-10-CM | POA: Diagnosis not present

## 2018-10-28 DIAGNOSIS — E1122 Type 2 diabetes mellitus with diabetic chronic kidney disease: Secondary | ICD-10-CM | POA: Diagnosis not present

## 2018-10-28 DIAGNOSIS — N2581 Secondary hyperparathyroidism of renal origin: Secondary | ICD-10-CM | POA: Diagnosis not present

## 2018-10-30 DIAGNOSIS — E1129 Type 2 diabetes mellitus with other diabetic kidney complication: Secondary | ICD-10-CM | POA: Diagnosis not present

## 2018-10-30 DIAGNOSIS — Z992 Dependence on renal dialysis: Secondary | ICD-10-CM | POA: Diagnosis not present

## 2018-10-30 DIAGNOSIS — N186 End stage renal disease: Secondary | ICD-10-CM | POA: Diagnosis not present

## 2018-10-31 DIAGNOSIS — N186 End stage renal disease: Secondary | ICD-10-CM | POA: Diagnosis not present

## 2018-10-31 DIAGNOSIS — E876 Hypokalemia: Secondary | ICD-10-CM | POA: Diagnosis not present

## 2018-10-31 DIAGNOSIS — E1122 Type 2 diabetes mellitus with diabetic chronic kidney disease: Secondary | ICD-10-CM | POA: Diagnosis not present

## 2018-10-31 DIAGNOSIS — N2581 Secondary hyperparathyroidism of renal origin: Secondary | ICD-10-CM | POA: Diagnosis not present

## 2018-10-31 DIAGNOSIS — D631 Anemia in chronic kidney disease: Secondary | ICD-10-CM | POA: Diagnosis not present

## 2018-11-02 DIAGNOSIS — E876 Hypokalemia: Secondary | ICD-10-CM | POA: Diagnosis not present

## 2018-11-02 DIAGNOSIS — E1122 Type 2 diabetes mellitus with diabetic chronic kidney disease: Secondary | ICD-10-CM | POA: Diagnosis not present

## 2018-11-02 DIAGNOSIS — N186 End stage renal disease: Secondary | ICD-10-CM | POA: Diagnosis not present

## 2018-11-02 DIAGNOSIS — N2581 Secondary hyperparathyroidism of renal origin: Secondary | ICD-10-CM | POA: Diagnosis not present

## 2018-11-02 DIAGNOSIS — D631 Anemia in chronic kidney disease: Secondary | ICD-10-CM | POA: Diagnosis not present

## 2018-11-04 DIAGNOSIS — D631 Anemia in chronic kidney disease: Secondary | ICD-10-CM | POA: Diagnosis not present

## 2018-11-04 DIAGNOSIS — E876 Hypokalemia: Secondary | ICD-10-CM | POA: Diagnosis not present

## 2018-11-04 DIAGNOSIS — N2581 Secondary hyperparathyroidism of renal origin: Secondary | ICD-10-CM | POA: Diagnosis not present

## 2018-11-04 DIAGNOSIS — N186 End stage renal disease: Secondary | ICD-10-CM | POA: Diagnosis not present

## 2018-11-04 DIAGNOSIS — E1122 Type 2 diabetes mellitus with diabetic chronic kidney disease: Secondary | ICD-10-CM | POA: Diagnosis not present

## 2018-11-07 DIAGNOSIS — D631 Anemia in chronic kidney disease: Secondary | ICD-10-CM | POA: Diagnosis not present

## 2018-11-07 DIAGNOSIS — E1122 Type 2 diabetes mellitus with diabetic chronic kidney disease: Secondary | ICD-10-CM | POA: Diagnosis not present

## 2018-11-07 DIAGNOSIS — E876 Hypokalemia: Secondary | ICD-10-CM | POA: Diagnosis not present

## 2018-11-07 DIAGNOSIS — N186 End stage renal disease: Secondary | ICD-10-CM | POA: Diagnosis not present

## 2018-11-07 DIAGNOSIS — N2581 Secondary hyperparathyroidism of renal origin: Secondary | ICD-10-CM | POA: Diagnosis not present

## 2018-11-09 DIAGNOSIS — E876 Hypokalemia: Secondary | ICD-10-CM | POA: Diagnosis not present

## 2018-11-09 DIAGNOSIS — N2581 Secondary hyperparathyroidism of renal origin: Secondary | ICD-10-CM | POA: Diagnosis not present

## 2018-11-09 DIAGNOSIS — E1122 Type 2 diabetes mellitus with diabetic chronic kidney disease: Secondary | ICD-10-CM | POA: Diagnosis not present

## 2018-11-09 DIAGNOSIS — D631 Anemia in chronic kidney disease: Secondary | ICD-10-CM | POA: Diagnosis not present

## 2018-11-09 DIAGNOSIS — N186 End stage renal disease: Secondary | ICD-10-CM | POA: Diagnosis not present

## 2018-11-11 DIAGNOSIS — E876 Hypokalemia: Secondary | ICD-10-CM | POA: Diagnosis not present

## 2018-11-11 DIAGNOSIS — N186 End stage renal disease: Secondary | ICD-10-CM | POA: Diagnosis not present

## 2018-11-11 DIAGNOSIS — N2581 Secondary hyperparathyroidism of renal origin: Secondary | ICD-10-CM | POA: Diagnosis not present

## 2018-11-11 DIAGNOSIS — D631 Anemia in chronic kidney disease: Secondary | ICD-10-CM | POA: Diagnosis not present

## 2018-11-11 DIAGNOSIS — E1122 Type 2 diabetes mellitus with diabetic chronic kidney disease: Secondary | ICD-10-CM | POA: Diagnosis not present

## 2018-11-14 DIAGNOSIS — D631 Anemia in chronic kidney disease: Secondary | ICD-10-CM | POA: Diagnosis not present

## 2018-11-14 DIAGNOSIS — N186 End stage renal disease: Secondary | ICD-10-CM | POA: Diagnosis not present

## 2018-11-14 DIAGNOSIS — N2581 Secondary hyperparathyroidism of renal origin: Secondary | ICD-10-CM | POA: Diagnosis not present

## 2018-11-14 DIAGNOSIS — E876 Hypokalemia: Secondary | ICD-10-CM | POA: Diagnosis not present

## 2018-11-14 DIAGNOSIS — E1122 Type 2 diabetes mellitus with diabetic chronic kidney disease: Secondary | ICD-10-CM | POA: Diagnosis not present

## 2018-11-16 DIAGNOSIS — N2581 Secondary hyperparathyroidism of renal origin: Secondary | ICD-10-CM | POA: Diagnosis not present

## 2018-11-16 DIAGNOSIS — E1122 Type 2 diabetes mellitus with diabetic chronic kidney disease: Secondary | ICD-10-CM | POA: Diagnosis not present

## 2018-11-16 DIAGNOSIS — N186 End stage renal disease: Secondary | ICD-10-CM | POA: Diagnosis not present

## 2018-11-16 DIAGNOSIS — D631 Anemia in chronic kidney disease: Secondary | ICD-10-CM | POA: Diagnosis not present

## 2018-11-16 DIAGNOSIS — E876 Hypokalemia: Secondary | ICD-10-CM | POA: Diagnosis not present

## 2018-11-18 DIAGNOSIS — D631 Anemia in chronic kidney disease: Secondary | ICD-10-CM | POA: Diagnosis not present

## 2018-11-18 DIAGNOSIS — E1122 Type 2 diabetes mellitus with diabetic chronic kidney disease: Secondary | ICD-10-CM | POA: Diagnosis not present

## 2018-11-18 DIAGNOSIS — E876 Hypokalemia: Secondary | ICD-10-CM | POA: Diagnosis not present

## 2018-11-18 DIAGNOSIS — N186 End stage renal disease: Secondary | ICD-10-CM | POA: Diagnosis not present

## 2018-11-18 DIAGNOSIS — N2581 Secondary hyperparathyroidism of renal origin: Secondary | ICD-10-CM | POA: Diagnosis not present

## 2018-11-21 DIAGNOSIS — D631 Anemia in chronic kidney disease: Secondary | ICD-10-CM | POA: Diagnosis not present

## 2018-11-21 DIAGNOSIS — E1122 Type 2 diabetes mellitus with diabetic chronic kidney disease: Secondary | ICD-10-CM | POA: Diagnosis not present

## 2018-11-21 DIAGNOSIS — N2581 Secondary hyperparathyroidism of renal origin: Secondary | ICD-10-CM | POA: Diagnosis not present

## 2018-11-21 DIAGNOSIS — E876 Hypokalemia: Secondary | ICD-10-CM | POA: Diagnosis not present

## 2018-11-21 DIAGNOSIS — N186 End stage renal disease: Secondary | ICD-10-CM | POA: Diagnosis not present

## 2018-11-23 DIAGNOSIS — D631 Anemia in chronic kidney disease: Secondary | ICD-10-CM | POA: Diagnosis not present

## 2018-11-23 DIAGNOSIS — N186 End stage renal disease: Secondary | ICD-10-CM | POA: Diagnosis not present

## 2018-11-23 DIAGNOSIS — E876 Hypokalemia: Secondary | ICD-10-CM | POA: Diagnosis not present

## 2018-11-23 DIAGNOSIS — N2581 Secondary hyperparathyroidism of renal origin: Secondary | ICD-10-CM | POA: Diagnosis not present

## 2018-11-23 DIAGNOSIS — E1122 Type 2 diabetes mellitus with diabetic chronic kidney disease: Secondary | ICD-10-CM | POA: Diagnosis not present

## 2018-11-25 DIAGNOSIS — N2581 Secondary hyperparathyroidism of renal origin: Secondary | ICD-10-CM | POA: Diagnosis not present

## 2018-11-25 DIAGNOSIS — E876 Hypokalemia: Secondary | ICD-10-CM | POA: Diagnosis not present

## 2018-11-25 DIAGNOSIS — N186 End stage renal disease: Secondary | ICD-10-CM | POA: Diagnosis not present

## 2018-11-25 DIAGNOSIS — D631 Anemia in chronic kidney disease: Secondary | ICD-10-CM | POA: Diagnosis not present

## 2018-11-25 DIAGNOSIS — E1122 Type 2 diabetes mellitus with diabetic chronic kidney disease: Secondary | ICD-10-CM | POA: Diagnosis not present

## 2018-11-28 DIAGNOSIS — N186 End stage renal disease: Secondary | ICD-10-CM | POA: Diagnosis not present

## 2018-11-28 DIAGNOSIS — N2581 Secondary hyperparathyroidism of renal origin: Secondary | ICD-10-CM | POA: Diagnosis not present

## 2018-11-28 DIAGNOSIS — E1122 Type 2 diabetes mellitus with diabetic chronic kidney disease: Secondary | ICD-10-CM | POA: Diagnosis not present

## 2018-11-28 DIAGNOSIS — E876 Hypokalemia: Secondary | ICD-10-CM | POA: Diagnosis not present

## 2018-11-28 DIAGNOSIS — D631 Anemia in chronic kidney disease: Secondary | ICD-10-CM | POA: Diagnosis not present

## 2018-11-30 DIAGNOSIS — E1129 Type 2 diabetes mellitus with other diabetic kidney complication: Secondary | ICD-10-CM | POA: Diagnosis not present

## 2018-11-30 DIAGNOSIS — N186 End stage renal disease: Secondary | ICD-10-CM | POA: Diagnosis not present

## 2018-11-30 DIAGNOSIS — Z992 Dependence on renal dialysis: Secondary | ICD-10-CM | POA: Diagnosis not present

## 2018-12-24 ENCOUNTER — Other Ambulatory Visit: Payer: Self-pay | Admitting: Cardiovascular Disease

## 2018-12-26 NOTE — Telephone Encounter (Signed)
Clopidogrel 75 mg refilled.

## 2018-12-28 ENCOUNTER — Other Ambulatory Visit: Payer: Self-pay | Admitting: Internal Medicine

## 2018-12-29 ENCOUNTER — Other Ambulatory Visit: Payer: Self-pay | Admitting: Internal Medicine

## 2018-12-30 ENCOUNTER — Other Ambulatory Visit: Payer: Self-pay | Admitting: Internal Medicine

## 2019-01-11 ENCOUNTER — Other Ambulatory Visit: Payer: Self-pay | Admitting: Internal Medicine

## 2019-01-21 ENCOUNTER — Other Ambulatory Visit: Payer: Self-pay | Admitting: Internal Medicine

## 2019-01-21 ENCOUNTER — Other Ambulatory Visit: Payer: Self-pay | Admitting: Cardiovascular Disease

## 2019-01-26 ENCOUNTER — Other Ambulatory Visit: Payer: Self-pay | Admitting: Internal Medicine

## 2019-01-26 ENCOUNTER — Telehealth: Payer: Self-pay | Admitting: Internal Medicine

## 2019-01-26 NOTE — Telephone Encounter (Signed)
Appointment scheduled. She is checking on transportation and will call back if this day and time does not work.

## 2019-01-26 NOTE — Telephone Encounter (Signed)
Copied from [place] [id number]. Topic: Quick Communication - Rx Refill/Question >> Jan 26, 2019 10:21 AM Andria Frames L wrote: Medication: glipiZIDE (GLUCOTROL XL) 10 MG 24 hr tablet [[id number]] nebivolol (BYSTOLIC) 5 MG tablet [[id number]]  Has the patient contacted their pharmacy?  Preferred Pharmacy (with phone number or street name): [id number] [address], [place] [phone] (Phone) [phone] (Fax)    Agent: Please be advised that RX refills may take up to 3 business days. We ask that you follow-up with your pharmacy.

## 2019-01-26 NOTE — Telephone Encounter (Signed)
Pt called back stating she can get transportation for 11am on 01/31/2019. Please advise.

## 2019-01-26 NOTE — Telephone Encounter (Signed)
Pt needs an OV or a doxy for med refills. She is overdue.

## 2019-01-26 NOTE — Telephone Encounter (Signed)
Completed prior authorization process for the Bystolic medication. This medication is already on the patients plan. No prior authorization was needed.

## 2019-01-31 ENCOUNTER — Other Ambulatory Visit: Payer: Medicare Other

## 2019-01-31 ENCOUNTER — Telehealth: Payer: Self-pay | Admitting: *Deleted

## 2019-01-31 ENCOUNTER — Encounter: Payer: Self-pay | Admitting: Internal Medicine

## 2019-01-31 ENCOUNTER — Other Ambulatory Visit: Payer: Self-pay | Admitting: Internal Medicine

## 2019-01-31 ENCOUNTER — Other Ambulatory Visit: Payer: Self-pay

## 2019-01-31 ENCOUNTER — Other Ambulatory Visit (INDEPENDENT_AMBULATORY_CARE_PROVIDER_SITE_OTHER): Payer: Medicare Other

## 2019-01-31 ENCOUNTER — Ambulatory Visit (INDEPENDENT_AMBULATORY_CARE_PROVIDER_SITE_OTHER): Payer: Medicare Other | Admitting: Internal Medicine

## 2019-01-31 VITALS — BP 168/88 | HR 74 | Temp 98.3°F | Ht 59.0 in | Wt 136.0 lb

## 2019-01-31 DIAGNOSIS — D509 Iron deficiency anemia, unspecified: Secondary | ICD-10-CM

## 2019-01-31 DIAGNOSIS — E559 Vitamin D deficiency, unspecified: Secondary | ICD-10-CM

## 2019-01-31 DIAGNOSIS — E1142 Type 2 diabetes mellitus with diabetic polyneuropathy: Secondary | ICD-10-CM | POA: Diagnosis not present

## 2019-01-31 DIAGNOSIS — E538 Deficiency of other specified B group vitamins: Secondary | ICD-10-CM

## 2019-01-31 DIAGNOSIS — Z0000 Encounter for general adult medical examination without abnormal findings: Secondary | ICD-10-CM

## 2019-01-31 LAB — BASIC METABOLIC PANEL
BUN: 32 mg/dL — ABNORMAL HIGH (ref 6–23)
CO2: 36 mEq/L — ABNORMAL HIGH (ref 19–32)
Calcium: 9.1 mg/dL (ref 8.4–10.5)
Chloride: 92 mEq/L — ABNORMAL LOW (ref 96–112)
Creatinine, Ser: 6.39 mg/dL (ref 0.40–1.20)
GFR: 7.88 mL/min — CL (ref >60.00)
Glucose, Bld: 184 mg/dL — ABNORMAL HIGH (ref 70–99)
Potassium: 3.8 mEq/L (ref 3.5–5.1)
Sodium: 138 mEq/L (ref 135–145)

## 2019-01-31 LAB — CBC WITH DIFFERENTIAL/PLATELET
Basophils Absolute: 0.1 10*3/uL (ref 0.0–0.1)
Basophils Relative: 1.2 % (ref 0.0–3.0)
Eosinophils Absolute: 0.2 10*3/uL (ref 0.0–0.7)
Eosinophils Relative: 2.1 % (ref 0.0–5.0)
HCT: 34.9 % — ABNORMAL LOW (ref 36.0–46.0)
Hemoglobin: 11.8 g/dL — ABNORMAL LOW (ref 12.0–15.0)
Lymphocytes Relative: 27.9 % (ref 12.0–46.0)
Lymphs Abs: 2.2 10*3/uL (ref 0.7–4.0)
MCHC: 33.8 g/dL (ref 30.0–36.0)
MCV: 96.5 fl (ref 78.0–100.0)
Monocytes Absolute: 0.6 10*3/uL (ref 0.1–1.0)
Monocytes Relative: 7.8 % (ref 3.0–12.0)
Neutro Abs: 4.8 10*3/uL (ref 1.4–7.7)
Neutrophils Relative %: 61 % (ref 43.0–77.0)
Platelets: 268 10*3/uL (ref 150.0–400.0)
RBC: 3.62 Mil/uL — ABNORMAL LOW (ref 3.87–5.11)
RDW: 18.5 % — ABNORMAL HIGH (ref 11.5–15.5)
WBC: 7.8 10*3/uL (ref 4.0–10.5)

## 2019-01-31 LAB — HEPATIC FUNCTION PANEL
ALT: 8 U/L (ref 0–35)
AST: 15 U/L (ref 0–37)
Albumin: 4.1 g/dL (ref 3.5–5.2)
Alkaline Phosphatase: 74 U/L (ref 39–117)
Bilirubin, Direct: 0.1 mg/dL (ref 0.0–0.3)
Total Bilirubin: 0.4 mg/dL (ref 0.2–1.2)
Total Protein: 7.6 g/dL (ref 6.0–8.3)

## 2019-01-31 LAB — HEMOGLOBIN A1C: Hgb A1c MFr Bld: 8 % — ABNORMAL HIGH (ref 4.6–6.5)

## 2019-01-31 LAB — LIPID PANEL
Cholesterol: 190 mg/dL (ref 0–200)
HDL: 40 mg/dL (ref >39.00)
LDL Cholesterol: 117 mg/dL — ABNORMAL HIGH (ref 0–99)
NonHDL: 149.51
Total CHOL/HDL Ratio: 5
Triglycerides: 163 mg/dL — ABNORMAL HIGH (ref 0.0–149.0)
VLDL: 32.6 mg/dL (ref 0.0–40.0)

## 2019-01-31 LAB — IBC PANEL
Iron: 131 ug/dL (ref 42–145)
Saturation Ratios: 78 % — ABNORMAL HIGH (ref 20.0–50.0)
Transferrin: 120 mg/dL — ABNORMAL LOW (ref 212.0–360.0)

## 2019-01-31 LAB — TSH: TSH: 2.51 u[IU]/mL (ref 0.35–4.50)

## 2019-01-31 LAB — VITAMIN D 25 HYDROXY (VIT D DEFICIENCY, FRACTURES): VITD: 58.06 ng/mL (ref 30.00–100.00)

## 2019-01-31 LAB — VITAMIN B12: Vitamin B-12: 1211 pg/mL — ABNORMAL HIGH (ref 211–911)

## 2019-01-31 MED ORDER — PIOGLITAZONE HCL 15 MG PO TABS: 15.0000 mg | ORAL_TABLET | Freq: Every day | ORAL | 3 refills | Status: DC

## 2019-01-31 NOTE — Progress Notes (Signed)
Subjective:    Patient ID: Carolyn Valenzuela, female    DOB: 02-Aug-1951, 68 y.o.   MRN: [id number]  HPI  Here for wellness and f/u;  Overall doing ok;  Pt denies Chest pain, worsening SOB, DOE, wheezing, orthopnea, PND, worsening LE edema, palpitations, dizziness or syncope.  Pt denies neurological change such as new headache, facial or extremity weakness.  Pt denies polydipsia, polyuria, or low sugar symptoms. Pt states overall good compliance with treatment and medications, good tolerability, and has been trying to follow appropriate diet.  Pt denies worsening depressive symptoms, suicidal ideation or panic. No fever, night sweats, wt loss, loss of appetite, or other constitutional symptoms.  Pt states good ability with ADL's, has low fall risk, home safety reviewed and adequate, no other significant changes in hearing or vision, and only occasionally active with exercise.  CBG 72 this am, sometimes over 100 in the AM  ocnt's HD on Mon wed Friday. Past Medical History:  Diagnosis Date  . Allergic rhinitis, cause unspecified 12/08/2010  . ANEMIA-IRON DEFICIENCY 01/25/2008  . Aortic regurgitation   . CAD (coronary artery disease)    a. 07/2016 NSTEMI/PCI: LM nl, LAD 70ost, 50m/d, 80d, D1 40ost, LCX 99ost/p (2.5x16 Synergy DES - 2.75), 40/106m, Om2 100 CTO, RCA  30p/m. Pt eval by CT surgery, not felt to be suitable candidate 2/2 comorbidities.  . CERVICAL RADICULOPATHY, LEFT 01/25/2008  . Chronic systolic CHF (congestive heart failure) ([place])    a. 07/2016 Echo: EF 40-45%, diff HK, sev AI, mild MVP w/ mod to sev MR, PASP 40mmHg.  . Critical lower limb ischemia   . DIABETES MELLITUS, UNCONTROLLED 05/21/2009  . Esophagitis   . ESRD on dialysis ([place])   . Foot ulcer ([place])    right lateral malleolus  . HCAP (healthcare-associated pneumonia) 06/29/2016  . HYPERLIPIDEMIA 03/30/2007  . HYPERTENSION 01/25/2008  . Ischemic cardiomyopathy    a. 07/2016 Echo: EF 40-45%.  . Mitral regurgitation    a. 07/2016  Echo: Mild MVP w/ mod to sev MR.  [name] PVD (peripheral vascular disease) ([place])    a. s/p bilat BKA  . SECONDARY HYPERPARATHYROIDISM 05/21/2009  . Severe aortic regurgitation    a. 07/2016 Echo: Severe AI.   Past Surgical History:  Procedure Laterality Date  . AMPUTATION Right 03/15/2014   Procedure: RIGHT  LEG  BELOW KNEE AMPUTATION ;  Surgeon: [name], MD;  Location: [facility];  Service: Orthopedics;  Laterality: Right;  . AMPUTATION Left 11/29/2015   Procedure: AMPUTATION BELOW KNEE;  Surgeon: Newt Minion, MD;  Location: [facility];  Service: Orthopedics;  Laterality: Left;  . AV FISTULA PLACEMENT Left   . AV FISTULA PLACEMENT Left 09/06/2014   Procedure: ARTERIOVENOUS (AV) FISTULA CREATION-Left Brachiocephalic;  Surgeon: Conrad Denton, MD;  Location: [facility];  Service: Vascular;  Laterality: Left;  . CARDIAC CATHETERIZATION N/A 06/30/2016   Procedure: Left Heart Cath and Coronary Angiography;  Surgeon: Burnell Blanks, MD;  Location: [facility];  Service: Cardiovascular;  Laterality: N/A;  . CARDIAC CATHETERIZATION N/A 07/02/2016   Procedure: Coronary Stent Intervention;  Surgeon: Nelva Bush, MD;  Location: [facility];  Service: Cardiovascular;  Laterality: N/A;  . CATARACT EXTRACTION    . COLONOSCOPY    . CORONARY ANGIOPLASTY WITH STENT PLACEMENT  07/02/2016  . ESOPHAGOGASTRODUODENOSCOPY N/A 03/20/2016   Procedure: ESOPHAGOGASTRODUODENOSCOPY (EGD);  Surgeon: Milus Banister, MD;  Location: [facility];  Service: Endoscopy;  Laterality: N/A;  . EYE SURGERY Bilateral  cataracts removed, left eye still has some oil in it.  . INSERTION OF DIALYSIS CATHETER N/A 09/03/2014   Procedure: INSERTION OF DIALYSIS CATHETER RIGHT INTERNAL JUGULAR VEIN;  Surgeon: Conrad Heidelberg, MD;  Location: [facility];  Service: Vascular;  Laterality: N/A;  . PERIPHERAL VASCULAR CATHETERIZATION N/A 08/13/2015   Procedure: Abdominal Aortogram;  Surgeon: Elam Dutch, MD;  Location: [facility];   Service: Cardiovascular;  Laterality: N/A;    reports that she has never smoked. She has never used smokeless tobacco. She reports that she does not drink alcohol or use drugs. family history includes Cancer in her mother. Allergies  Allergen Reactions  . Oxycodone Itching   Current Outpatient Medications on File Prior to Visit  Medication Sig Dispense Refill  . aspirin 81 MG EC tablet Take 81 mg by mouth daily.      [name] azithromycin (ZITHROMAX) 250 MG tablet 2 tabs po qd x 1 day; 1 tablet per day x 4 days; 6 tablet 0  . benzonatate (TESSALON) 100 MG capsule Take 1 capsule (100 mg total) by mouth 3 (three) times daily as needed. 20 capsule 0  . cinacalcet (SENSIPAR) 30 MG tablet Take 30 mg by mouth daily.  5  . clopidogrel (PLAVIX) 75 MG tablet TAKE 1 TABLET BY MOUTH EVERY DAY 90 tablet 0  . famotidine (PEPCID) 20 MG tablet TAKE 1 TABLET BY MOUTH EVERY DAY 90 tablet 0  . gabapentin (NEURONTIN) 100 MG capsule TAKE 2 CAPSULES (200 MG TOTAL) BY MOUTH 3 (THREE) TIMES DAILY. 540 capsule 0  . glucose blood (ONE TOUCH ULTRA TEST) test strip Use as instructed, twice daily code E11.52 200 each 12  . lidocaine-prilocaine (EMLA) cream APPLY A SMALL AMOUNT TO SKIN 3 TIMES A WEEK OVER DIALYSIS SHUNT 30 MINUTES PRIOR TO DIALYSIS 30 g 5  . lidocaine-prilocaine (EMLA) cream APPLY A SMALL AMOUNT TO SKIN 3 TIMES A WEEK OVER DIALYSIS SHUNT 30 MINUTES PRIOR TO DIALYSIS 30 g 5  . multivitamin (RENA-VIT) TABS tablet TAKE 1 TABLET BY MOUTH EVERY DAY AT BEDTIME 90 tablet 0  . nebivolol (BYSTOLIC) 5 MG tablet Take 1 tablet (5 mg total) by mouth daily. 90 tablet 3  . ONETOUCH VERIO test strip USE TO CHECK BLOOD SUGARS TWICE A DAY DX E11.9 100 each 5  . pantoprazole (PROTONIX) 40 MG tablet Take 1 tablet (40 mg total) by mouth 2 (two) times daily. 60 tablet 5  . PAZEO 0.7 % SOLN INSTILL 1 DROP INTO BOTH EYES EVERY DAY IN THE MORNING  12  . RESTASIS 0.05 % ophthalmic emulsion INSTILL 1 DROP INTO BOTH EYES EVERY 12 HOURS   4  . saccharomyces boulardii (FLORASTOR) 250 MG capsule Take 1 capsule (250 mg total) by mouth daily. 90 capsule 3  . sevelamer carbonate (RENVELA) 800 MG tablet Take 1,600 mg by mouth 3 (three) times daily with meals.    . sitaGLIPtin (JANUVIA) 100 MG tablet Take 1 tablet (100 mg total) by mouth daily. 90 tablet 3  . amLODipine (NORVASC) 5 MG tablet Take 1 tablet (5 mg total) by mouth daily. 90 tablet 3   Current Facility-Administered Medications on File Prior to Visit  Medication Dose Route Frequency Provider Last Rate Last Dose  . aflibercept (EYLEA) SOLN 2 mg  2 mg Intravitreal  Bernarda Caffey, MD   2 mg at 05/24/18 1654  . Bevacizumab (AVASTIN) SOLN 1.25 mg  1.25 mg Intravitreal  Bernarda Caffey, MD   1.25 mg at 10/26/17 1520  .  Bevacizumab (AVASTIN) SOLN 1.25 mg  1.25 mg Intravitreal  Bernarda Caffey, MD   1.25 mg at 12/01/17 1324  . Bevacizumab (AVASTIN) SOLN 1.25 mg  1.25 mg Intravitreal  Bernarda Caffey, MD   1.25 mg at 12/30/17 1660  . Bevacizumab (AVASTIN) SOLN 1.25 mg  1.25 mg Intravitreal  Bernarda Caffey, MD   1.25 mg at 01/27/18 1323  . triamcinolone acetonide (TRIESENCE) 40 MG/ML subtenons injection 4 mg  4 mg Intravitreal  Bernarda Caffey, MD   4 mg at 02/24/18 1122  . triamcinolone acetonide (TRIESENCE) 40 MG/ML subtenons injection 4 mg  4 mg Intravitreal  Bernarda Caffey, MD   4 mg at 04/12/18 1304   Review of Systems Constitutional: Negative for other unusual diaphoresis, sweats, appetite or weight changes HENT: Negative for other worsening hearing loss, ear pain, facial swelling, mouth sores or neck stiffness.   Eyes: Negative for other worsening pain, redness or other visual disturbance.  Respiratory: Negative for other stridor or swelling Cardiovascular: Negative for other palpitations or other chest pain  Gastrointestinal: Negative for worsening diarrhea or loose stools, blood in stool, distention or other pain Genitourinary: Negative for hematuria, flank pain or other change in  urine volume.  Musculoskeletal: Negative for myalgias or other joint swelling.  Skin: Negative for other color change, or other wound or worsening drainage.  Neurological: Negative for other syncope or numbness. Hematological: Negative for other adenopathy or swelling Psychiatric/Behavioral: Negative for hallucinations, other worsening agitation, SI, self-injury, or new decreased concentration All other system neg per pt    Objective:   Physical Exam BP (!) 168/88   Pulse 74   Temp 98.3 F (36.8 C) (Oral)   Ht 4\' 11"  (1.499 m)   Wt 136 lb (61.7 kg)   SpO2 95%   BMI 27.47 kg/m  VS noted,  Constitutional: Pt is oriented to person, place, and time. Appears well-developed and well-nourished, in no significant distress and comfortable Head: Normocephalic and atraumatic  Eyes: Conjunctivae and EOM are normal. Pupils are equal, round, and reactive to light Right Ear: External ear normal without discharge Left Ear: External ear normal without discharge Nose: Nose without discharge or deformity Mouth/Throat: Oropharynx is without other ulcerations and moist  Neck: Normal range of motion. Neck supple. No JVD present. No tracheal deviation present or significant neck LA or mass Cardiovascular: Normal rate, regular rhythm, normal heart sounds and intact distal pulses.   Pulmonary/Chest: WOB normal and breath sounds without rales or wheezing  Abdominal: Soft. Bowel sounds are normal. NT. No HSM  Musculoskeletal: Normal range of motion. Exhibits no edema, s/p bilat BKA Lymphadenopathy: Has no other cervical adenopathy.  Neurological: Pt is alert and oriented to person, place, and time. Pt has normal reflexes. No cranial nerve deficit. Motor grossly intact, Gait intact Skin: Skin is warm and dry. No rash noted or new ulcerations Psychiatric:  Has normal mood and affect. Behavior is normal without agitation No other exam findings Lab Results  Component Value Date   WBC 7.8 01/31/2019   HGB  11.8 (L) 01/31/2019   HCT 34.9 (L) 01/31/2019   PLT 268.0 01/31/2019   GLUCOSE 184 (H) 01/31/2019   CHOL 190 01/31/2019   TRIG 163.0 (H) 01/31/2019   HDL 40.00 01/31/2019   LDLDIRECT 86.0 04/06/2017   LDLCALC 117 (H) 01/31/2019   ALT 8 01/31/2019   AST 15 01/31/2019   NA 138 01/31/2019   K 3.8 01/31/2019   CL 92 (L) 01/31/2019   CREATININE 6.39 (HH) 01/31/2019  BUN 32 (H) 01/31/2019   CO2 36 (H) 01/31/2019   TSH 2.51 01/31/2019   INR 1.06 06/30/2016   HGBA1C 8.0 (H) 01/31/2019   MICROALBUR 108.1 (H) 09/15/2016      Assessment & Plan:

## 2019-01-31 NOTE — Telephone Encounter (Signed)
Unable to leave a message, no voicemail.  

## 2019-01-31 NOTE — Patient Instructions (Signed)

## 2019-02-01 ENCOUNTER — Encounter: Payer: Self-pay | Admitting: Internal Medicine

## 2019-02-01 ENCOUNTER — Telehealth: Payer: Self-pay

## 2019-02-01 NOTE — Telephone Encounter (Signed)
-----   Message from Biagio Borg, MD sent at 01/31/2019  6:27 PM EDT ----- Left message on MyChart, pt to cont same tx except  The test results show that your current treatment is OK, except the A1c is still too high.  Please start a low dose of actos 15 mg per day, and continue all other medications.  I will send the prescription, and you should hear from the office as well.    Carolyn Valenzuela to please inform pt, I will do rx

## 2019-02-01 NOTE — Assessment & Plan Note (Signed)

## 2019-02-01 NOTE — Assessment & Plan Note (Signed)
stable overall by history and exam, recent data reviewed with pt, and pt to continue medical treatment as before,  to f/u any worsening symptoms or concerns, for a1c with labs 

## 2019-02-01 NOTE — Telephone Encounter (Signed)
Attempted to contact pt. She has not VM, phone continuously rings.    CRM created.

## 2019-02-01 NOTE — Assessment & Plan Note (Signed)
For iron level

## 2019-02-02 ENCOUNTER — Telehealth: Payer: Self-pay

## 2019-02-02 ENCOUNTER — Telehealth: Payer: Self-pay | Admitting: Internal Medicine

## 2019-02-02 MED ORDER — INSULIN GLARGINE 100 UNIT/ML SOLOSTAR PEN: 10.0000 [IU] | PEN_INJECTOR | Freq: Every day | SUBCUTANEOUS | 11 refills | Status: DC

## 2019-02-02 NOTE — Telephone Encounter (Signed)
Pt was calling back to discuss lab results and the newly prescribed actors 15mg . She has been informed and expressed understanding.   Copied from [place] [id number]. Topic: Quick Communication - Rx Refill/Question >> Feb 02, 2019  9:27 AM Erick Blinks wrote: Medication: pioglitazone (ACTOS) 15 MG tablet [[id number]] -pt called to inquire about this medication. Pt has questions, please advise. Expecting a call from [phone]

## 2019-02-02 NOTE — Telephone Encounter (Signed)
Patient does not want to take medication pioglitazone.  Patient did read side effect and would like to be prescribed something else.  Patient call back # [phone]

## 2019-02-02 NOTE — Telephone Encounter (Signed)
Ok to start Insulin glargine, needs to start insulin one shot per day - done erx

## 2019-02-02 NOTE — Telephone Encounter (Signed)
Pt has been informed and expressed understanding.  

## 2019-02-02 NOTE — Addendum Note (Signed)
Addended by: Biagio Borg on: 02/02/2019 01:12 PM   Modules accepted: Orders

## 2019-02-02 NOTE — Telephone Encounter (Signed)
Yes, b/c the lantus is very low dose to start and her a1c is quite high

## 2019-02-02 NOTE — Telephone Encounter (Signed)
Copied from [place] [id number]. Topic: General - Inquiry >> Feb 02, 2019  3:06 PM Mathis Bud wrote: Reason for CRM: patient would like to know if she should still be taking medications since PCP prescribe her Insulin Glargine (LANTUS) 100 UNIT/ML Solostar Pen.  She wants to be sure it is safe to take all three. glipiZIDE (GLUCOTROL XL) 10 MG 24 sitaGLIPtin (JANUVIA) 100 MG tablet Patient call back # [phone]

## 2019-02-06 ENCOUNTER — Telehealth: Payer: Self-pay | Admitting: Internal Medicine

## 2019-02-06 NOTE — Telephone Encounter (Signed)
The reason for the injection is that her current medication is not working well enough as the A1c is too high, and the pills are at the highest dose.   There is no other pills that are appropriate, given her level of high A1c, so I have to say the insulin should be the next step  I can refer her to endocrinology if she likes

## 2019-02-06 NOTE — Telephone Encounter (Signed)
Copied from [place] [id number]. Topic: Quick Communication - See Telephone Encounter >> Feb 06, 2019  1:12 PM Robina Ade, Helene Kelp D wrote: CRM for notification. See Telephone encounter for: 02/06/19. Patient called and said that she would like to speak with Dr. Jenny Reichmann or his CMA about medications. Patient would like to stay on glipizide and jenuvia but be off her medication that she needs to use needles. Please call patient back, thanks.

## 2019-02-07 MED ORDER — PIOGLITAZONE HCL 15 MG PO TABS: 15.0000 mg | ORAL_TABLET | Freq: Every day | ORAL | 3 refills | Status: DC

## 2019-02-07 NOTE — Telephone Encounter (Signed)
Thanks, for some reason I did not send the rx  Is now done

## 2019-02-07 NOTE — Telephone Encounter (Signed)
Pt informed of below. She states she received 01/31/19 letter and PCP and it stated he was sending in Actos for her to take. Patient states she is going to try adding Actos to her current regimen. She wants to hold off on Endo referral for now. Did you send in Actos Dr. Jenny Reichmann?

## 2019-02-08 ENCOUNTER — Other Ambulatory Visit: Payer: Self-pay | Admitting: Internal Medicine

## 2019-02-23 ENCOUNTER — Telehealth: Payer: Self-pay | Admitting: Internal Medicine

## 2019-02-23 NOTE — Telephone Encounter (Signed)
Form has been completed & placed in providers box to sign.  °

## 2019-02-23 NOTE — Telephone Encounter (Signed)
Forms signed, Copy sent to scan.   Patient informed, &Orginail mailed to patient.

## 2019-02-23 NOTE — Telephone Encounter (Signed)
Pt dropped off parking placard to be signed.  Placing in Brittany's box for tracking.

## 2019-02-24 ENCOUNTER — Other Ambulatory Visit: Payer: Self-pay | Admitting: Cardiovascular Disease

## 2019-03-22 ENCOUNTER — Other Ambulatory Visit: Payer: Self-pay | Admitting: Internal Medicine

## 2019-03-22 ENCOUNTER — Other Ambulatory Visit: Payer: Self-pay | Admitting: Cardiovascular Disease

## 2019-03-26 ENCOUNTER — Other Ambulatory Visit: Payer: Self-pay | Admitting: Internal Medicine

## 2019-03-28 ENCOUNTER — Telehealth: Payer: Self-pay

## 2019-03-28 ENCOUNTER — Telehealth: Payer: Self-pay | Admitting: Cardiovascular Disease

## 2019-03-28 ENCOUNTER — Encounter: Payer: Medicare Other | Admitting: Cardiovascular Disease

## 2019-03-28 NOTE — Telephone Encounter (Signed)
Called pt x3 for virtual visit with Dr. Gwenlyn Found today 7/28. No answer and unable to leave VM

## 2019-03-28 NOTE — Telephone Encounter (Signed)

## 2019-03-28 NOTE — Progress Notes (Deleted)
{Choose 1 Note Type (Telehealth Visit or Telephone Visit):[id number]}   Date:  03/28/2019   ID:  Carolyn Valenzuela, DOB 1951/08/17, MRN [id number]  {Patient Location:[phone]::"Home"} {Provider Location:[phone]::"Home"}  PCP:  Biagio Borg, MD  Cardiologist:  No primary care provider on file. *** Electrophysiologist:  None   Evaluation Performed:  {Choose Visit Type:[phone]::"Follow-Up Visit"}  Chief Complaint:  ***  History of Present Illness:    Carolyn Valenzuela is a 68 y.o.  with h/o type 2 diabetes, hyperlipidemia, Hypertension, chronic kidney diseasecurrently on hemodialysiswho was referredfor evaluation of peripheral vascular disease.I last saw her in the office  03/08/2018. Patient reports that she has been having some discomfort in the lateral aspects of her right and left toes. Pain is present both at rest and with activity, has been ongoing for 4-5 months. It is worst with touch. She also has a chronic wound on her right malleolus for which she is seen by her podiatrist, Dr. Elby Showers. She otherwise denies any pain in her calves or legs with ambulation.  She denies any shortness of breath, chest pain, recent lower extremity swelling or palpitations.  She works at [employer] at [employer] and is on her feet all day. She doesn't exercise on a regular basis.  She has never smoked. She denies any history of heart disease and any family history of ischemic heart disease.  I performed lower extremity arterial Doppler studies which did not show significant obstructive disease however she is to have diminished TBIs bilaterally suggesting the possibility of poor healing as a result of a surgical procedure..She ultimately has undergone bilateral below-the-knee amputations by Dr. Doran Durand and Sharol Given.In addition, she has known CAD documented angiographically in October of last year by Dr. Angelena Form she had surgical anatomy however she was turned down for surgical  revascularization because of frailty and comorbidities. She'll underwent circumflex stenting by Dr. ChrisEnd11/2/17 with a synergy drug-eluting stent. A 2-D echocardiogram performed 10/12/2017 revealed normal LV systolic function with grade 1 diastolic dysfunction.  Since I saw her 6 months ago she is complained of increasing dyspnea over the last month.  She denies chest pain.  The patient {does/does not:200015} have symptoms concerning for COVID-19 infection (fever, chills, cough, or new shortness of breath).    Past Medical History:  Diagnosis Date  . Allergic rhinitis, cause unspecified 12/08/2010  . ANEMIA-IRON DEFICIENCY 01/25/2008  . Aortic regurgitation   . CAD (coronary artery disease)    a. 07/2016 NSTEMI/PCI: LM nl, LAD 70ost, 82m/d, 80d, D1 40ost, LCX 99ost/p (2.5x16 Synergy DES - 2.75), 40/87m, Om2 100 CTO, RCA  30p/m. Pt eval by CT surgery, not felt to be suitable candidate 2/2 comorbidities.  . CERVICAL RADICULOPATHY, LEFT 01/25/2008  . Chronic systolic CHF (congestive heart failure) ([place])    a. 07/2016 Echo: EF 40-45%, diff HK, sev AI, mild MVP w/ mod to sev MR, PASP 81mmHg.  . Critical lower limb ischemia   . DIABETES MELLITUS, UNCONTROLLED 05/21/2009  . Esophagitis   . ESRD on dialysis ([place])   . Foot ulcer ([place])    right lateral malleolus  . HCAP (healthcare-associated pneumonia) 06/29/2016  . HYPERLIPIDEMIA 03/30/2007  . HYPERTENSION 01/25/2008  . Ischemic cardiomyopathy    a. 07/2016 Echo: EF 40-45%.  . Mitral regurgitation    a. 07/2016 Echo: Mild MVP w/ mod to sev MR.  [name] PVD (peripheral vascular disease) ([place])    a. s/p bilat BKA  . SECONDARY HYPERPARATHYROIDISM 05/21/2009  . Severe aortic regurgitation  a. 07/2016 Echo: Severe AI.   Past Surgical History:  Procedure Laterality Date  . AMPUTATION Right 03/15/2014   Procedure: RIGHT  LEG  BELOW KNEE AMPUTATION ;  Surgeon: [name], MD;  Location: [facility];  Service: Orthopedics;  Laterality: Right;  . AMPUTATION  Left 11/29/2015   Procedure: AMPUTATION BELOW KNEE;  Surgeon: Newt Minion, MD;  Location: [facility];  Service: Orthopedics;  Laterality: Left;  . AV FISTULA PLACEMENT Left   . AV FISTULA PLACEMENT Left 09/06/2014   Procedure: ARTERIOVENOUS (AV) FISTULA CREATION-Left Brachiocephalic;  Surgeon: Conrad San Patricio, MD;  Location: [facility];  Service: Vascular;  Laterality: Left;  . CARDIAC CATHETERIZATION N/A 06/30/2016   Procedure: Left Heart Cath and Coronary Angiography;  Surgeon: Burnell Blanks, MD;  Location: [facility];  Service: Cardiovascular;  Laterality: N/A;  . CARDIAC CATHETERIZATION N/A 07/02/2016   Procedure: Coronary Stent Intervention;  Surgeon: Nelva Bush, MD;  Location: [facility];  Service: Cardiovascular;  Laterality: N/A;  . CATARACT EXTRACTION    . COLONOSCOPY    . CORONARY ANGIOPLASTY WITH STENT PLACEMENT  07/02/2016  . ESOPHAGOGASTRODUODENOSCOPY N/A 03/20/2016   Procedure: ESOPHAGOGASTRODUODENOSCOPY (EGD);  Surgeon: Milus Banister, MD;  Location: [facility];  Service: Endoscopy;  Laterality: N/A;  . EYE SURGERY Bilateral    cataracts removed, left eye still has some oil in it.  . INSERTION OF DIALYSIS CATHETER N/A 09/03/2014   Procedure: INSERTION OF DIALYSIS CATHETER RIGHT INTERNAL JUGULAR VEIN;  Surgeon: Conrad San Joaquin, MD;  Location: [facility];  Service: Vascular;  Laterality: N/A;  . PERIPHERAL VASCULAR CATHETERIZATION N/A 08/13/2015   Procedure: Abdominal Aortogram;  Surgeon: Elam Dutch, MD;  Location: [facility];  Service: Cardiovascular;  Laterality: N/A;     No outpatient medications have been marked as taking for the 03/28/19 encounter (Appointment) with Lorretta Harp, MD.   Current Facility-Administered Medications for the 03/28/19 encounter (Appointment) with Lorretta Harp, MD  Medication  . aflibercept (EYLEA) SOLN 2 mg  . Bevacizumab (AVASTIN) SOLN 1.25 mg  . Bevacizumab (AVASTIN) SOLN 1.25 mg  . Bevacizumab (AVASTIN) SOLN 1.25  mg  . Bevacizumab (AVASTIN) SOLN 1.25 mg  . triamcinolone acetonide (TRIESENCE) 40 MG/ML subtenons injection 4 mg  . triamcinolone acetonide (TRIESENCE) 40 MG/ML subtenons injection 4 mg     Allergies:   Oxycodone   Social History   Tobacco Use  . Smoking status: Never Smoker  . Smokeless tobacco: Never Used  Substance Use Topics  . Alcohol use: No    Alcohol/week: 0.0 standard drinks  . Drug use: No     Family Hx: The patient's family history includes Cancer in her mother.  ROS:   Please see the history of present illness.    *** All other systems reviewed and are negative.   Prior CV studies:   The following studies were reviewed today:  ***  Labs/Other Tests and Data Reviewed:    EKG:  {EKG/Telemetry Strips Reviewed:[phone]}  Recent Labs: 01/31/2019: ALT 8; BUN 32; Creatinine, Ser 6.39; Hemoglobin 11.8; Platelets 268.0; Potassium 3.8; Sodium 138; TSH 2.51   Recent Lipid Panel Lab Results  Component Value Date/Time   CHOL 190 01/31/2019 11:42 AM   TRIG 163.0 (H) 01/31/2019 11:42 AM   HDL 40.00 01/31/2019 11:42 AM   CHOLHDL 5 01/31/2019 11:42 AM   LDLCALC 117 (H) 01/31/2019 11:42 AM   LDLDIRECT 86.0 04/06/2017 11:31 AM    Wt Readings from Last 3 Encounters:  01/31/19 136 lb (61.7 kg)  07/07/18 139 lb 1.9 oz (63.1 kg)  04/07/18 140 lb (63.5 kg)     Objective:    Vital Signs:  There were no vitals taken for this visit.   {[facility] Virtual Exam (Optional):[phone]::"VITAL SIGNS:  reviewed"}  ASSESSMENT & PLAN:    1. ***  COVID-19 Education: The signs and symptoms of COVID-19 were discussed with the patient and how to seek care for testing (follow up with PCP or arrange E-visit).  ***The importance of social distancing was discussed today.  Time:   Today, I have spent *** minutes with the patient with telehealth technology discussing the above problems.     Medication Adjustments/Labs and Tests Ordered: Current medicines are reviewed at  length with the patient today.  Concerns regarding medicines are outlined above.   Tests Ordered: No orders of the defined types were placed in this encounter.   Medication Changes: No orders of the defined types were placed in this encounter.   Follow Up:  {F/U Format:[phone]} {follow up:15908}  Signed, Quay Burow, MD  03/28/2019 2:03 PM    [place]

## 2019-04-12 ENCOUNTER — Other Ambulatory Visit: Payer: Self-pay | Admitting: Internal Medicine

## 2019-04-21 ENCOUNTER — Emergency Department (HOSPITAL_COMMUNITY)
Admission: EM | Admit: 2019-04-21 | Discharge: 2019-04-21 | Disposition: A | Discharge status: Home / Self Care | Payer: Medicare Other | Attending: Emergency Medicine | Admitting: Emergency Medicine

## 2019-04-21 ENCOUNTER — Other Ambulatory Visit: Payer: Self-pay

## 2019-04-21 ENCOUNTER — Other Ambulatory Visit: Payer: Self-pay | Admitting: Cardiovascular Disease

## 2019-04-21 ENCOUNTER — Encounter (HOSPITAL_COMMUNITY): Payer: Self-pay | Admitting: Emergency Medicine

## 2019-04-21 ENCOUNTER — Emergency Department (HOSPITAL_COMMUNITY): Payer: Medicare Other

## 2019-04-21 DIAGNOSIS — I132 Hypertensive heart and chronic kidney disease with heart failure and with stage 5 chronic kidney disease, or end stage renal disease: Secondary | ICD-10-CM | POA: Insufficient documentation

## 2019-04-21 DIAGNOSIS — Z7982 Long term (current) use of aspirin: Secondary | ICD-10-CM | POA: Diagnosis not present

## 2019-04-21 DIAGNOSIS — I10 Essential (primary) hypertension: Secondary | ICD-10-CM

## 2019-04-21 DIAGNOSIS — S62327A Displaced fracture of shaft of fifth metacarpal bone, left hand, initial encounter for closed fracture: Secondary | ICD-10-CM | POA: Insufficient documentation

## 2019-04-21 DIAGNOSIS — W19XXXA Unspecified fall, initial encounter: Secondary | ICD-10-CM

## 2019-04-21 DIAGNOSIS — Z7984 Long term (current) use of oral hypoglycemic drugs: Secondary | ICD-10-CM | POA: Insufficient documentation

## 2019-04-21 DIAGNOSIS — Y9301 Activity, walking, marching and hiking: Secondary | ICD-10-CM | POA: Insufficient documentation

## 2019-04-21 DIAGNOSIS — N186 End stage renal disease: Secondary | ICD-10-CM | POA: Insufficient documentation

## 2019-04-21 DIAGNOSIS — R55 Syncope and collapse: Secondary | ICD-10-CM | POA: Diagnosis not present

## 2019-04-21 DIAGNOSIS — Y999 Unspecified external cause status: Secondary | ICD-10-CM | POA: Insufficient documentation

## 2019-04-21 DIAGNOSIS — I5022 Chronic systolic (congestive) heart failure: Secondary | ICD-10-CM | POA: Diagnosis not present

## 2019-04-21 DIAGNOSIS — M25552 Pain in left hip: Secondary | ICD-10-CM | POA: Insufficient documentation

## 2019-04-21 DIAGNOSIS — Z79899 Other long term (current) drug therapy: Secondary | ICD-10-CM | POA: Diagnosis not present

## 2019-04-21 DIAGNOSIS — S6992XA Unspecified injury of left wrist, hand and finger(s), initial encounter: Secondary | ICD-10-CM | POA: Diagnosis present

## 2019-04-21 DIAGNOSIS — I2510 Atherosclerotic heart disease of native coronary artery without angina pectoris: Secondary | ICD-10-CM | POA: Diagnosis not present

## 2019-04-21 DIAGNOSIS — Y929 Unspecified place or not applicable: Secondary | ICD-10-CM | POA: Diagnosis not present

## 2019-04-21 DIAGNOSIS — Z7902 Long term (current) use of antithrombotics/antiplatelets: Secondary | ICD-10-CM | POA: Insufficient documentation

## 2019-04-21 DIAGNOSIS — I252 Old myocardial infarction: Secondary | ICD-10-CM | POA: Insufficient documentation

## 2019-04-21 DIAGNOSIS — E1122 Type 2 diabetes mellitus with diabetic chronic kidney disease: Secondary | ICD-10-CM | POA: Insufficient documentation

## 2019-04-21 DIAGNOSIS — Z992 Dependence on renal dialysis: Secondary | ICD-10-CM | POA: Diagnosis not present

## 2019-04-21 DIAGNOSIS — X58XXXA Exposure to other specified factors, initial encounter: Secondary | ICD-10-CM | POA: Diagnosis not present

## 2019-04-21 DIAGNOSIS — S62307A Unspecified fracture of fifth metacarpal bone, left hand, initial encounter for closed fracture: Secondary | ICD-10-CM

## 2019-04-21 LAB — BASIC METABOLIC PANEL
Anion gap: 13 (ref 5–15)
BUN: 6 mg/dL — ABNORMAL LOW (ref 8–23)
CO2: 30 mmol/L (ref 22–32)
Calcium: 8.9 mg/dL (ref 8.9–10.3)
Chloride: 96 mmol/L — ABNORMAL LOW (ref 98–111)
Creatinine, Ser: 2.65 mg/dL — ABNORMAL HIGH (ref 0.44–1.00)
GFR calc Af Amer: 21 mL/min — ABNORMAL LOW (ref >60)
GFR calc non Af Amer: 18 mL/min — ABNORMAL LOW (ref >60)
Glucose, Bld: 143 mg/dL — ABNORMAL HIGH (ref 70–99)
Potassium: 3.2 mmol/L — ABNORMAL LOW (ref 3.5–5.1)
Sodium: 139 mmol/L (ref 135–145)

## 2019-04-21 LAB — CBC WITH DIFFERENTIAL/PLATELET
Abs Immature Granulocytes: 0.02 10*3/uL (ref 0.00–0.07)
Basophils Absolute: 0.1 10*3/uL (ref 0.0–0.1)
Basophils Relative: 1 %
Eosinophils Absolute: 0.2 10*3/uL (ref 0.0–0.5)
Eosinophils Relative: 3 %
HCT: 30.5 % — ABNORMAL LOW (ref 36.0–46.0)
Hemoglobin: 10.1 g/dL — ABNORMAL LOW (ref 12.0–15.0)
Immature Granulocytes: 0 %
Lymphocytes Relative: 36 %
Lymphs Abs: 2.1 10*3/uL (ref 0.7–4.0)
MCH: 32.2 pg (ref 26.0–34.0)
MCHC: 33.1 g/dL (ref 30.0–36.0)
MCV: 97.1 fL (ref 80.0–100.0)
Monocytes Absolute: 0.5 10*3/uL (ref 0.1–1.0)
Monocytes Relative: 8 %
Neutro Abs: 3.1 10*3/uL (ref 1.7–7.7)
Neutrophils Relative %: 52 %
Platelets: 288 10*3/uL (ref 150–400)
RBC: 3.14 MIL/uL — ABNORMAL LOW (ref 3.87–5.11)
RDW: 16.6 % — ABNORMAL HIGH (ref 11.5–15.5)
WBC: 6 10*3/uL (ref 4.0–10.5)
nRBC: 0 % (ref 0.0–0.2)

## 2019-04-21 MED ORDER — POTASSIUM CHLORIDE CRYS ER 20 MEQ PO TBCR
40.0000 meq | EXTENDED_RELEASE_TABLET | Freq: Once | ORAL | Status: AC
  Administered 2019-04-21: 40 meq via ORAL
  Filled 2019-04-21: qty 2

## 2019-04-21 MED ORDER — ACETAMINOPHEN 325 MG PO TABS
650.0000 mg | ORAL_TABLET | Freq: Once | ORAL | Status: AC
  Administered 2019-04-21: 16:00:00 650 mg via ORAL
  Filled 2019-04-21: qty 2

## 2019-04-21 MED ORDER — HYDROMORPHONE HCL 1 MG/ML IJ SOLN: 0.5000 mg | Freq: Once | INTRAMUSCULAR | Status: DC

## 2019-04-21 NOTE — Progress Notes (Signed)
Orthopedic Tech Progress Note Patient Details:  Carolyn Valenzuela 10-17-1950 KD:[id number]  Ortho Devices Type of Ortho Device: Ulna gutter splint Ortho Device/Splint Location: left Ortho Device/Splint Interventions: Application   Post Interventions Patient Tolerated: Well Instructions Provided: Care of device   [name] 04/21/2019, 3:21 PM

## 2019-04-21 NOTE — TOC Transition Note (Signed)
Transition of Care [facility]) - CM/SW Discharge Note   Patient Details  Name: Carolyn Valenzuela MRN: [id number] Date of Birth: August 30, 1951  [facility] CM/SW Contact:  Fuller Mandril, RN Phone Number: 04/21/2019, 3:35 PM   Clinical Narrative:     [facility] met with pt at bedside regarding DME rolling walker.    Final next level of care: Home/Self Care Barriers to Discharge: Barriers Resolved   Patient Goals and CMS Choice        Discharge Placement                       Discharge Plan and Services   Discharge Planning Services: CM Consult            DME Arranged: Walker rolling DME Agency: [employer] Date DME Agency Contacted: 04/21/19 Time DME Agency Contacted: [phone] Representative spoke with at DME Agency: Milagros Loll, RN, BSN, NCM [phone] Pt qualifies for DME rolling walker.  DME  ordered through [place].  [facility] notified to deliver rolling walker to pt room prior to D/C home.         Social Determinants of Health (SDOH) Interventions     Readmission Risk Interventions No flowsheet data found.

## 2019-04-21 NOTE — ED Notes (Signed)
Ice applied to left hip

## 2019-04-21 NOTE — ED Provider Notes (Signed)
[facility] EMERGENCY DEPARTMENT Provider Note   CSN: [id number] Arrival date & time: 04/21/19  1237     History   Chief Complaint Chief Complaint  Patient presents with  . Fall  . Near Syncope    HPI GENEVER FISSEL is a 68 y.o. female with past medical history significant for diabetes, ESRD on dialysis, CAD, anemia, hypertension presents emergency department today with chief complaint of fall.  Patient states she was walking out of dialysis after finishing her session when she felt her left prosthetic leg become loose causing her to lose her balance.  She grabbed onto nearby chairs to lower herself down.  She denies hitting her head or passing out. EMS reports she had near syncope on arrival. Pt is reporting pain to left hand and left hip.  Did not take anything for pain prior to arrival.  She denies any numbness, tingling, dizziness, headache, open wound, laceration, bleeding. Pt is anticoagulated, on plavix.  History provided by patient with additional history obtained from chart review.     Past Medical History:  Diagnosis Date  . Allergic rhinitis, cause unspecified 12/08/2010  . ANEMIA-IRON DEFICIENCY 01/25/2008  . Aortic regurgitation   . CAD (coronary artery disease)    a. 07/2016 NSTEMI/PCI: LM nl, LAD 70ost, 27m/d, 80d, D1 40ost, LCX 99ost/p (2.5x16 Synergy DES - 2.75), 40/53m, Om2 100 CTO, RCA  30p/m. Pt eval by CT surgery, not felt to be suitable candidate 2/2 comorbidities.  . CERVICAL RADICULOPATHY, LEFT 01/25/2008  . Chronic systolic CHF (congestive heart failure) ([place])    a. 07/2016 Echo: EF 40-45%, diff HK, sev AI, mild MVP w/ mod to sev MR, PASP 46mmHg.  . Critical lower limb ischemia   . DIABETES MELLITUS, UNCONTROLLED 05/21/2009  . Esophagitis   . ESRD on dialysis ([place])   . Foot ulcer ([place])    right lateral malleolus  . HCAP (healthcare-associated pneumonia) 06/29/2016  . HYPERLIPIDEMIA 03/30/2007  . HYPERTENSION 01/25/2008  . Ischemic  cardiomyopathy    a. 07/2016 Echo: EF 40-45%.  . Mitral regurgitation    a. 07/2016 Echo: Mild MVP w/ mod to sev MR.  [name] PVD (peripheral vascular disease) ([place])    a. s/p bilat BKA  . SECONDARY HYPERPARATHYROIDISM 05/21/2009  . Severe aortic regurgitation    a. 07/2016 Echo: Severe AI.    Patient Active Problem List   Diagnosis Date Noted  . Left hip pain 11/18/2017  . GERD (gastroesophageal reflux disease) 10/10/2017  . Bloating 10/10/2017  . PVD (peripheral vascular disease) ([place])   . Phantom pain after amputation of lower extremity ([place]) 01/05/2017  . Abnormal CXR 07/07/2016  . Malnutrition of moderate degree 07/03/2016  . Hypotension   . NSTEMI (non-ST elevated myocardial infarction) ([place])   . Nausea and vomiting 03/18/2016  . Sepsis ([place]) due to UTI 12/20/2015  . Acute lower UTI   . Labile blood pressure   . SIRS (systemic inflammatory response syndrome) (HCC)   . Hypoglycemia associated with diabetes ([place])   . Urinary retention   . Dysphagia   . S/P bilateral BKA (below knee amputation) ([place])   . Abnormality of gait   . Muscle spasm   . ESRD on dialysis ([place])   . Type 2 diabetes mellitus with diabetic peripheral angiopathy and gangrene, without long-term current use of insulin ([place])   . Post-operative pain   . Metabolic bone disease   . Hypoxia   . Type 2 diabetes mellitus with peripheral neuropathy (HCC)   .  Labile blood glucose   . Postoperative pain   . Acute blood loss anemia   . Anemia of chronic disease   . Below knee amputation status 11/29/2015  . Odynophagia 11/12/2015  . Chest pain, atypical 06/06/2015  . Ingrown nail 05/28/2015  . Acute pulmonary edema (HCC)   . FUO (fever of unknown origin)   . HCAP (healthcare-associated pneumonia)   . Type 2 diabetes mellitus with ESRD (end-stage renal disease) ([place]) 09/01/2014  . Anemia of renal disease 09/01/2014  . Venous insufficiency of left leg 08/27/2014  . Rash 07/06/2014  . Diabetic wet gangrene of the  foot - Right 03/15/2014  . Critical lower limb ischemia 01/09/2014  . Right foot ulcer ([place]) 11/07/2013  . Toe pain 09/06/2013  . Pre-ulcerative corn or callous 09/06/2013  . Lower extremity pain 09/06/2013  . Low back pain 12/08/2011  . Hip bursitis, left 12/08/2011  . Left leg pain 12/08/2011  . Abdominal pain, other specified site 07/08/2011  . Preventative health care 12/08/2010  . Allergic rhinitis, cause unspecified 12/08/2010  . SECONDARY HYPERPARATHYROIDISM 05/21/2009  . NUMBNESS 05/21/2009  . BACK PAIN 05/02/2009  . ANEMIA-IRON DEFICIENCY 01/25/2008  . Essential hypertension 01/25/2008  . CERVICAL RADICULOPATHY, LEFT 01/25/2008  . Hyperlipidemia 03/30/2007    Past Surgical History:  Procedure Laterality Date  . AMPUTATION Right 03/15/2014   Procedure: RIGHT  LEG  BELOW KNEE AMPUTATION ;  Surgeon: [name], MD;  Location: [facility];  Service: Orthopedics;  Laterality: Right;  . AMPUTATION Left 11/29/2015   Procedure: AMPUTATION BELOW KNEE;  Surgeon: Newt Minion, MD;  Location: [facility];  Service: Orthopedics;  Laterality: Left;  . AV FISTULA PLACEMENT Left   . AV FISTULA PLACEMENT Left 09/06/2014   Procedure: ARTERIOVENOUS (AV) FISTULA CREATION-Left Brachiocephalic;  Surgeon: Conrad Kysorville, MD;  Location: [facility];  Service: Vascular;  Laterality: Left;  . CARDIAC CATHETERIZATION N/A 06/30/2016   Procedure: Left Heart Cath and Coronary Angiography;  Surgeon: Burnell Blanks, MD;  Location: [facility];  Service: Cardiovascular;  Laterality: N/A;  . CARDIAC CATHETERIZATION N/A 07/02/2016   Procedure: Coronary Stent Intervention;  Surgeon: Nelva Bush, MD;  Location: [facility];  Service: Cardiovascular;  Laterality: N/A;  . CATARACT EXTRACTION    . COLONOSCOPY    . CORONARY ANGIOPLASTY WITH STENT PLACEMENT  07/02/2016  . ESOPHAGOGASTRODUODENOSCOPY N/A 03/20/2016   Procedure: ESOPHAGOGASTRODUODENOSCOPY (EGD);  Surgeon: Milus Banister, MD;  Location: [facility];  Service: Endoscopy;  Laterality: N/A;  . EYE SURGERY Bilateral    cataracts removed, left eye still has some oil in it.  . INSERTION OF DIALYSIS CATHETER N/A 09/03/2014   Procedure: INSERTION OF DIALYSIS CATHETER RIGHT INTERNAL JUGULAR VEIN;  Surgeon: Conrad [place], MD;  Location: [facility];  Service: Vascular;  Laterality: N/A;  . PERIPHERAL VASCULAR CATHETERIZATION N/A 08/13/2015   Procedure: Abdominal Aortogram;  Surgeon: Elam Dutch, MD;  Location: [facility];  Service: Cardiovascular;  Laterality: N/A;     OB History   No obstetric history on file.      Home Medications    Prior to Admission medications   Medication Sig Start Date End Date Taking? Authorizing Provider  amLODipine (NORVASC) 5 MG tablet Take 1 tablet (5 mg total) by mouth daily. 03/08/18 04/21/19 Yes Lorretta Harp, MD  aspirin 81 MG EC tablet Take 81 mg by mouth daily.     Yes [provider]  BYSTOLIC 5 MG tablet TAKE 1  TABLET BY MOUTH EVERY DAY Patient taking differently: Take 5 mg by mouth daily.  02/24/19  Yes Lorretta Harp, MD  cinacalcet (SENSIPAR) 30 MG tablet Take 30 mg by mouth daily. 05/26/18  Yes [provider]  clopidogrel (PLAVIX) 75 MG tablet TAKE 1 TABLET BY MOUTH EVERY DAY Patient taking differently: Take 75 mg by mouth daily.  03/22/19  Yes Lorretta Harp, MD  gabapentin (NEURONTIN) 100 MG capsule TAKE 2 CAPSULES (200 MG TOTAL) BY MOUTH 3 (THREE) TIMES DAILY. 04/12/19  Yes Biagio Borg, MD  glipiZIDE (GLUCOTROL XL) 10 MG 24 hr tablet Take 1 tablet (10 mg total) by mouth daily with breakfast. 01/31/19  Yes Biagio Borg, MD  JANUVIA 100 MG tablet TAKE 1 TABLET BY MOUTH EVERY DAY Patient taking differently: Take 100 mg by mouth daily.  03/27/19  Yes Biagio Borg, MD  lidocaine-prilocaine (EMLA) cream APPLY A SMALL AMOUNT TO SKIN 3 TIMES A WEEK OVER DIALYSIS SHUNT 30 MINUTES PRIOR TO DIALYSIS Patient taking differently: Apply 1 application topically as needed.  APPLY A SMALL AMOUNT TO SKIN 3 TIMES A WEEK OVER DIALYSIS SHUNT 30 MINUTES PRIOR TO DIALYSIS 04/06/17  Yes Biagio Borg, MD  multivitamin (RENA-VIT) TABS tablet TAKE 1 TABLET BY MOUTH EVERYDAY AT BEDTIME Patient taking differently: Take 1 tablet by mouth at bedtime.  03/22/19  Yes Biagio Borg, MD  VELPHORO 500 MG chewable tablet Chew 1,000 mg by mouth 3 (three) times daily. 12/23/18  Yes [provider]  azithromycin (ZITHROMAX) 250 MG tablet 2 tabs po qd x 1 day; 1 tablet per day x 4 days; Patient not taking: Reported on 04/21/2019 07/07/18   Marrian Salvage, FNP  benzonatate (TESSALON) 100 MG capsule Take 1 capsule (100 mg total) by mouth 3 (three) times daily as needed. Patient not taking: Reported on 04/21/2019 07/07/18   Marrian Salvage, FNP  famotidine (PEPCID) 20 MG tablet TAKE 1 TABLET BY MOUTH EVERY DAY Patient not taking: Reported on 04/21/2019 03/22/19   Biagio Borg, MD  glucose blood (ONE TOUCH ULTRA TEST) test strip Use as instructed, twice daily code E11.52 04/06/17   Biagio Borg, MD  lidocaine-prilocaine (EMLA) cream APPLY A SMALL AMOUNT TO SKIN 3 TIMES A WEEK OVER DIALYSIS SHUNT 30 MINUTES PRIOR TO DIALYSIS Patient not taking: Reported on 04/21/2019 11/29/17   Biagio Borg, MD  [facility] VERIO test strip USE TO CHECK BLOOD SUGARS TWICE A DAY DX E11.9 12/30/18   Biagio Borg, MD  pantoprazole ([place]) 40 MG tablet TAKE 1 TABLET BY MOUTH TWICE A DAY Patient not taking: Reported on 04/21/2019 02/08/19   Biagio Borg, MD  pioglitazone (ACTOS) 15 MG tablet Take 1 tablet (15 mg total) by mouth daily. Patient not taking: Reported on 04/21/2019 02/07/19   Biagio Borg, MD  saccharomyces boulardii (FLORASTOR) 250 MG capsule Take 1 capsule (250 mg total) by mouth daily. Patient not taking: Reported on 04/21/2019 10/07/17   Biagio Borg, MD    Family History Family History  Problem Relation Age of Onset  . Cancer Mother        Pancreatic    Social History Social History    Tobacco Use  . Smoking status: Never Smoker  . Smokeless tobacco: Never Used  Substance Use Topics  . Alcohol use: No    Alcohol/week: 0.0 standard drinks  . Drug use: No     Allergies   Patient has no known allergies.  Review of Systems Review of Systems  Constitutional: Negative for chills and fever.  HENT: Negative for congestion, ear discharge, ear pain, sinus pressure, sinus pain and sore throat.   Eyes: Negative for pain and redness.  Respiratory: Negative for cough and shortness of breath.   Cardiovascular: Negative for chest pain.  Gastrointestinal: Negative for abdominal pain, constipation, diarrhea, nausea and vomiting.  Genitourinary: Negative for dysuria and hematuria.  Musculoskeletal: Positive for arthralgias and joint swelling. Negative for back pain and neck pain.  Skin: Negative for wound.  Neurological: Negative for weakness, numbness and headaches.     Physical Exam Updated Vital Signs BP (!) 177/59   Pulse 68   Temp 98.4 F (36.9 C) (Oral)   Resp 11   SpO2 100%   Physical Exam Vitals signs and nursing note reviewed.  Constitutional:      General: She is not in acute distress.    Appearance: She is not ill-appearing.  HENT:     Head: Normocephalic and atraumatic.     Comments: No tenderness to palpation of skull. No deformities or crepitus noted. No open wounds, abrasions or lacerations.    Right Ear: Tympanic membrane and external ear normal.     Left Ear: Tympanic membrane and external ear normal.     Nose: Nose normal.     Mouth/Throat:     Mouth: Mucous membranes are moist.     Pharynx: Oropharynx is clear.  Eyes:     General: No scleral icterus.       Right eye: No discharge.        Left eye: No discharge.     Extraocular Movements: Extraocular movements intact.     Conjunctiva/sclera: Conjunctivae normal.     Pupils: Pupils are equal, round, and reactive to light.  Neck:     Musculoskeletal: Normal range of motion.      Vascular: No JVD.  Cardiovascular:     Rate and Rhythm: Normal rate and regular rhythm.     Pulses: Normal pulses.          Radial pulses are 2+ on the right side and 2+ on the left side.     Heart sounds: Normal heart sounds.  Pulmonary:     Comments: Lungs clear to auscultation in all fields. Symmetric chest rise. No wheezing, rales, or rhonchi. Abdominal:     Comments: Abdomen is soft, non-distended, and non-tender in all quadrants. No rigidity, no guarding. No peritoneal signs.  Musculoskeletal: Normal range of motion.     Right shoulder: Normal.     Left shoulder: Normal.     Right elbow: Normal.    Left elbow: Normal.     Right wrist: Normal.     Right hip: Normal.       Arms:     Comments: Bilateral BKA with prostheses. Pelvis is stable with mild bony tenderness to left side. Full ROM of bilateral hips.  Skin:    General: Skin is warm and dry.     Capillary Refill: Capillary refill takes less than 2 seconds.  Neurological:     Mental Status: She is oriented to person, place, and time.     GCS: GCS eye subscore is 4. GCS verbal subscore is 5. GCS motor subscore is 6.     Comments: Fluent speech, no facial droop.  Psychiatric:        Behavior: Behavior normal.      ED Treatments / Results  Labs (all labs ordered are listed, but  only abnormal results are displayed) Labs Reviewed  CBC WITH DIFFERENTIAL/PLATELET - Abnormal; Notable for the following components:      Result Value   RBC 3.14 (*)    Hemoglobin 10.1 (*)    HCT 30.5 (*)    RDW 16.6 (*)    All other components within normal limits  BASIC METABOLIC PANEL - Abnormal; Notable for the following components:   Potassium 3.2 (*)    Chloride 96 (*)    Glucose, Bld 143 (*)    BUN 6 (*)    Creatinine, Ser 2.65 (*)    GFR calc non Af Amer 18 (*)    GFR calc Af Amer 21 (*)    All other components within normal limits    EKG EKG Interpretation  Date/Time:  Friday April 21 2019 12:37:57 EDT Ventricular Rate:   70 PR Interval:    QRS Duration: 86 QT Interval:  422 QTC Calculation: 456 R Axis:   45 Text Interpretation:  Sinus rhythm No significant change since last tracing Confirmed by Lajean Saver [id number]) on 04/21/2019 2:33:23 PM   Radiology Ct Head Wo Contrast  Result Date: 04/21/2019 CLINICAL DATA:  Syncope.  Renal failure EXAM: CT HEAD WITHOUT CONTRAST TECHNIQUE: Contiguous axial images were obtained from the base of the skull through the vertex without intravenous contrast. COMPARISON:  December 02, 2015 FINDINGS: Brain: Age related volume loss present. There is no intracranial mass, hemorrhage, extra-axial fluid collection, or midline shift. There is mild small vessel disease in the centra semiovale bilaterally. No evident acute infarct. Vascular: No hyperdense vessel. There is calcification in the distal vertebral arteries and carotid siphon regions bilaterally. Skull: Bony calvarium appears intact. Sinuses/Orbits: Paranasal sinuses which are visualized are clear. Orbits appear symmetric bilaterally. Other: Mastoid air cells are clear. IMPRESSION: Age related volume loss with mild periventricular small vessel disease. No acute infarct. No mass or hemorrhage. There are multiple foci of arterial vascular calcification. Electronically Signed   By: Lowella Grip III M.D.   On: 04/21/2019 13:57   Dg Hand Complete Left  Result Date: 04/21/2019 CLINICAL DATA:  Pt walking out of dialysis and prosthetic leg gave way and fell , did not hit head , no loc, p did have A very brief near syncope episode upon ems arrival C/o left hip and hand pain EXAM: LEFT HAND - COMPLETE 3+ VIEW COMPARISON:  None. FINDINGS: There is acute fracture of the mid aspect of the 5th metacarpal, associated with at least 1 shaft width displacement. There are mild degenerative changes in the interphalangeal joints and radiocarpal joint. Dense atherosclerotic calcification of the small vessels. IMPRESSION: Fracture of the 5th metacarpal.  Electronically Signed   By: Nolon Nations M.D.   On: 04/21/2019 13:44   Dg Hip Unilat W Or Wo Pelvis 2-3 Views Left  Result Date: 04/21/2019 CLINICAL DATA:  Pt walking out of dialysis and prosthetic leg gave way and fell , did not hit head , no loc, p did have A very brief near syncope episode upon EMS arrival C/o left hip and hand pain EXAM: DG HIP (WITH OR WITHOUT PELVIS) 2-3V LEFT COMPARISON:  11/18/2017 FINDINGS: No acute fracture or subluxation. Degenerative changes are seen in both hips. There is dense atherosclerotic calcification iliac and femoral vessels. Densely calcified masses in the pelvis are stable and consistent multiple fibroids. IMPRESSION: No evidence for acute abnormality. Electronically Signed   By: Nolon Nations M.D.   On: 04/21/2019 13:42    Procedures Procedures (including critical  care time)  Medications Ordered in ED Medications  potassium chloride SA (K-DUR) CR tablet 40 mEq (40 mEq Oral Given 04/21/19 1511)  acetaminophen (TYLENOL) tablet 650 mg (650 mg Oral Given 04/21/19 1629)     Initial Impression / Assessment and Plan / ED Course  I have reviewed the triage vital signs and the nursing notes.  Pertinent labs & imaging results that were available during my care of the patient were reviewed by me and considered in my medical decision making (see chart for details).  Patient presents to the ED with complaints of pain to the left hip and left hand  s/p injury mechanical fall. Pt had questionable near syncope with EMS. She denies syncope, and is able to in detail what happened. Labs appear close to her baseline. Mild hypokalemia, will replete orally.  EKG is without ischemic changes. Exam without obvious deformity or open wounds. Both left hand and left hip are tender to palpation with full ROM. NVI distally. Ct head is negative for bleeding. Xray of left hip is  negative for fracture/dislocation. Xray of left hand viewed by me shows closed  fracture of 5th  metacarpal. Ulnar gutter splint applied. I evaluated pt after splint application and sensation is intact, neurovascularly intact distally. Compartments soft above and below affected joint.  Pt typically ambulates with cane. We were able to get her a rolling walker while here in the ED. She ambulates well with steady gait using walker. I discussed results, treatment plan, need for follow-up with on call hand surgeon Dr. Fredna Dow, and gave strict return precautions with the patient. Provided opportunity for questions, patient confirmed understanding and are in agreement with plan. [place]ollo Findings and plan of care discussed with supervising physician Dr. Ashok Cordia.   This note was prepared using Dragon voice recognition software and may include unintentional dictation errors due to the inherent limitations of voice recognition software.   Final Clinical Impressions(s) / ED Diagnoses   Final diagnoses:  Fall, initial encounter  Unspecified fracture of fifth metacarpal bone, left hand, initial encounter for closed fracture    ED Discharge Orders    None       Cherre Robins, PA-C 04/21/19 1645    Lajean Saver, MD 04/22/19 0730

## 2019-04-21 NOTE — ED Triage Notes (Signed)
Pt walking out of diaylysis and prosthetic leg gave way and fell , did not hit head , no loc, p did have  A very brief near syncopy episode upon ems arrival  C/o left hip and wrist pain has pulse in both  Areas, pt [id number]

## 2019-04-21 NOTE — ED Notes (Signed)
paged ortho tech

## 2019-04-21 NOTE — ED Notes (Signed)
Pt ambulated in hallway with walker. Smooth steady gait although is complaining of some left hip pain.

## 2019-04-21 NOTE — Discharge Instructions (Addendum)
You were seen today for pain in your left hand and hip. Please read and follow all provided instructions.   The CT scan of your head is negative.  The xray of your hand shows a broken bone in your hand: 5th metacarpal.  1. Medications:  tylenol for pain control, usual home medications  2. Treatment: rest, ice, elevate and use brace, drink plenty of fluids. Wear the splint until you see the orthopedist.  3. Follow Up: Please followup with orthopedics in 1 week if no improvement for discussion of your diagnoses and further evaluation after today's visit. Please call the office of Dr. Fredna Dow on Monday to schedule your follow up appointment.   Please return to the ER for worsening symptoms or other concerns

## 2019-04-21 NOTE — ED Notes (Signed)
Pt to xray

## 2019-04-21 NOTE — TOC Initial Note (Signed)
Transition of Care [facility]) - Initial/Assessment Note    Patient Details  Name: Carolyn Valenzuela MRN: [id number] Date of Birth: [birth year]  [facility] CM/SW Contact:    Fuller Mandril, RN Phone Number: 04/21/2019, 3:33 PM  Clinical Narrative:                 [facility] consulted regarding securing DME rolling walker for pt.  Expected Discharge Plan: Home/Self Care Barriers to Discharge: Equipment Delay   Patient Goals and CMS Choice        Expected Discharge Plan and Services Expected Discharge Plan: Home/Self Care   Discharge Planning Services: CM Consult   Living arrangements for the past 2 months: Apartment Expected Discharge Date: 04/21/19               DME Arranged: Gilford Rile rolling DME Agency: [employer] Date DME Agency Contacted: 04/21/19 Time DME Agency Contacted: [phone] Representative spoke with at DME Agency: [place] Arrangements/Services Living arrangements for the past 2 months: Apartment Lives with:: Self   Do you feel safe going back to the place where you live?: Yes          Current home services: DME    Activities of Daily Living      Permission Sought/Granted                  Emotional Assessment Appearance:: Appears stated age Attitude/Demeanor/Rapport: Gracious, Engaged Affect (typically observed): Appropriate Orientation: : Oriented to Self, Oriented to Place, Oriented to  Time, Oriented to Situation Alcohol / Substance Use: Never Used Psych Involvement: No (comment)  Admission diagnosis:  fall Patient Active Problem List   Diagnosis Date Noted  . Left hip pain 11/18/2017  . GERD (gastroesophageal reflux disease) 10/10/2017  . Bloating 10/10/2017  . PVD (peripheral vascular disease) ([place])   . Phantom pain after amputation of lower extremity ([place]) 01/05/2017  . Abnormal CXR 07/07/2016  . Malnutrition of moderate degree 07/03/2016  . Hypotension   . NSTEMI (non-ST elevated myocardial infarction)  ([place])   . Nausea and vomiting 03/18/2016  . Sepsis ([place]) due to UTI 12/20/2015  . Acute lower UTI   . Labile blood pressure   . SIRS (systemic inflammatory response syndrome) (HCC)   . Hypoglycemia associated with diabetes ([place])   . Urinary retention   . Dysphagia   . S/P bilateral BKA (below knee amputation) ([place])   . Abnormality of gait   . Muscle spasm   . ESRD on dialysis ([place])   . Type 2 diabetes mellitus with diabetic peripheral angiopathy and gangrene, without long-term current use of insulin ([place])   . Post-operative pain   . Metabolic bone disease   . Hypoxia   . Type 2 diabetes mellitus with peripheral neuropathy (HCC)   . Labile blood glucose   . Postoperative pain   . Acute blood loss anemia   . Anemia of chronic disease   . Below knee amputation status 11/29/2015  . Odynophagia 11/12/2015  . Chest pain, atypical 06/06/2015  . Ingrown nail 05/28/2015  . Acute pulmonary edema (HCC)   . FUO (fever of unknown origin)   . HCAP (healthcare-associated pneumonia)   . Type 2 diabetes mellitus with ESRD (end-stage renal disease) ([place]) 09/01/2014  . Anemia of renal disease 09/01/2014  . Venous insufficiency of left leg 08/27/2014  . Rash 07/06/2014  . 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  Critical lower limb ischemia 01/09/2014  . Right foot ulcer ([place]) 11/07/2013  . Toe pain 09/06/2013  . Pre-ulcerative corn or callous 09/06/2013  . Lower extremity pain 09/06/2013  . Low back pain 12/08/2011  . Hip bursitis, left 12/08/2011  . Left leg pain 12/08/2011  . Abdominal pain, other specified site 07/08/2011  . Preventative health care 12/08/2010  . Allergic rhinitis, cause unspecified 12/08/2010  . SECONDARY HYPERPARATHYROIDISM 05/21/2009  . NUMBNESS 05/21/2009  . BACK PAIN 05/02/2009  . ANEMIA-IRON DEFICIENCY 01/25/2008  . Essential hypertension 01/25/2008  . CERVICAL RADICULOPATHY, LEFT 01/25/2008  . Hyperlipidemia 03/30/2007   PCP:  Biagio Borg,  MD Pharmacy:   [id number] - [address] [place] [address] Phone: [phone] Fax: [phone]     Social Determinants of Health (SDOH) Interventions    Readmission Risk Interventions No flowsheet data found.

## 2019-04-24 ENCOUNTER — Other Ambulatory Visit: Payer: Self-pay | Admitting: Internal Medicine

## 2019-04-24 MED ORDER — GLIPIZIDE ER 10 MG PO TB24: 10.0000 mg | ORAL_TABLET | Freq: Every day | ORAL | 1 refills | Status: DC

## 2019-04-24 NOTE — Telephone Encounter (Signed)
Medication Refill - Medication: glipiZIDE (GLUCOTROL XL) 10 MG 24 hr tablet   Has the patient contacted their pharmacy? No. (Agent: If no, request that the patient contact the pharmacy for the refill.) (Agent: If yes, when and what did the pharmacy advise?)  Preferred Pharmacy (with phone number or street name):  [id number] [address]  [place] [address]  Phone: [phone] Fax: [phone]  Not a 24 hour pharmacy; exact hours not known.     Agent: Please be advised that RX refills may take up to 3 business days. We ask that you follow-up with your pharmacy.

## 2019-04-25 ENCOUNTER — Other Ambulatory Visit: Payer: Self-pay | Admitting: Orthopedic Surgery

## 2019-04-25 ENCOUNTER — Encounter (HOSPITAL_BASED_OUTPATIENT_CLINIC_OR_DEPARTMENT_OTHER): Payer: Self-pay | Admitting: *Deleted

## 2019-04-25 ENCOUNTER — Other Ambulatory Visit: Payer: Self-pay

## 2019-04-25 DIAGNOSIS — S62327A Displaced fracture of shaft of fifth metacarpal bone, left hand, initial encounter for closed fracture: Secondary | ICD-10-CM | POA: Insufficient documentation

## 2019-04-27 ENCOUNTER — Telehealth: Payer: Self-pay

## 2019-04-27 NOTE — Telephone Encounter (Signed)
Called the patient on the number listed as her home number. The phone just rings then beeps and hangs up. Will try call the patient again.

## 2019-04-27 NOTE — Telephone Encounter (Signed)
Holding her aspirin and Plavix prior to her wrist surgery is fine with me Isaac Laud

## 2019-04-27 NOTE — Telephone Encounter (Signed)
   Primary Cardiologist: Dr. Gwenlyn Found  Chart reviewed as part of pre-operative protocol coverage. Because of Carolyn Valenzuela's past medical history and time since last visit, he/she will require a follow-up visit in order to better assess preoperative cardiovascular risk.  Pre-op covering staff: - Please schedule appointment and call patient to inform them. - Please contact requesting surgeon's office via preferred method (i.e, phone, fax) to inform them of need for appointment prior to surgery.  If applicable, this message will also be routed to pharmacy pool and/or primary cardiologist for input on holding anticoagulant/antiplatelet agent as requested below so that this information is available at time of patient's appointment.   [place], [place]  04/27/2019, 2:34 PM

## 2019-04-27 NOTE — Telephone Encounter (Signed)
   [facility] Pre-operative Risk Assessment    Request for surgical clearance:  1. What type of surgery is being performed? Open reduction internal fixation vs closed reduction percutaneous pinning; left small metacarpal fracture   2. When is this surgery scheduled? 05/01/19   3. What type of clearance is required (medical clearance vs. Pharmacy clearance to hold med vs. Both)? both  4. Are there any medications that need to be held prior to surgery and how long? Plavix 75 mg QD and ASA 81 mg QD   5. Practice name and name of physician performing surgery? Dr Richardo Priest  [facility]   6. What is your office phone number? [phone]    7.   What is your office fax number? [phone]  8.   Anesthesia type (None, local, MAC, general) ? choice   Audray Rumore, [place] 04/27/2019, 2:00 PM  _________________________________________________________________   (provider comments below)

## 2019-04-27 NOTE — Telephone Encounter (Signed)
Dr. Gwenlyn Found, we will arrange an earlier visit since patient has not been seen in over a year, since the surgery is for broken wrist, would be ok for the patient to start holding aspirin and plavix prior to ORIF? It should be a fairly low risk surgery since the fracture is at left 5th metacarpal.

## 2019-04-28 NOTE — Telephone Encounter (Signed)
Called the patient's sister [name] and informed her of the [name] needing an appointment and having to hold ASA and Plavix. Patient is scheduled for a virtual phone visit with Almyra Deforest, PA-C for 05/01/19 at 3:30PM. Mrs. Amedeo Plenty was informed to hold her sisters ASA and Plavix for 5 days after this mornings doses. Mrs Amedeo Plenty verbalized an understanding and al (if any) questions were answered. Mrs Amedeo Plenty also stated that there maybe an issue with the upcoming surgery due to the surgeon's office wants her to hold her dialysis for 3 days and the dialysis center is against. She stated that they are waiting to hear back as what is to be done.

## 2019-05-01 ENCOUNTER — Telehealth (INDEPENDENT_AMBULATORY_CARE_PROVIDER_SITE_OTHER): Payer: Medicare Other | Admitting: Physician Assistant

## 2019-05-01 ENCOUNTER — Encounter: Payer: Self-pay | Admitting: Physician Assistant

## 2019-05-01 ENCOUNTER — Inpatient Hospital Stay (HOSPITAL_COMMUNITY)
Admission: RE | Admit: 2019-05-01 | Discharge: 2019-05-01 | Disposition: A | Discharge status: Home / Self Care | Payer: Medicare Other | Source: Ambulatory Visit | Attending: Orthopedic Surgery | Admitting: Orthopedic Surgery

## 2019-05-01 VITALS — Ht 59.0 in

## 2019-05-01 DIAGNOSIS — R0602 Shortness of breath: Secondary | ICD-10-CM | POA: Diagnosis not present

## 2019-05-01 DIAGNOSIS — Z89512 Acquired absence of left leg below knee: Secondary | ICD-10-CM

## 2019-05-01 DIAGNOSIS — N186 End stage renal disease: Secondary | ICD-10-CM

## 2019-05-01 DIAGNOSIS — I2510 Atherosclerotic heart disease of native coronary artery without angina pectoris: Secondary | ICD-10-CM

## 2019-05-01 DIAGNOSIS — S6292XS Unspecified fracture of left wrist and hand, sequela: Secondary | ICD-10-CM

## 2019-05-01 DIAGNOSIS — Z992 Dependence on renal dialysis: Secondary | ICD-10-CM

## 2019-05-01 DIAGNOSIS — I10 Essential (primary) hypertension: Secondary | ICD-10-CM

## 2019-05-01 DIAGNOSIS — E119 Type 2 diabetes mellitus without complications: Secondary | ICD-10-CM

## 2019-05-01 DIAGNOSIS — Z89511 Acquired absence of right leg below knee: Secondary | ICD-10-CM

## 2019-05-01 DIAGNOSIS — E785 Hyperlipidemia, unspecified: Secondary | ICD-10-CM

## 2019-05-01 NOTE — Progress Notes (Signed)
Virtual Visit via Telephone Note   This visit type was conducted due to national recommendations for restrictions regarding the COVID-19 Pandemic (e.g. social distancing) in an effort to limit this patient's exposure and mitigate transmission in our community.  Due to her co-morbid illnesses, this patient is at least at moderate risk for complications without adequate follow up.  This format is felt to be most appropriate for this patient at this time.  The patient did not have access to video technology/had technical difficulties with video requiring transitioning to audio format only (telephone).  All issues noted in this document were discussed and addressed.  No physical exam could be performed with this format.  Please refer to the patient's chart for her  consent to telehealth for [facility].   Date:  05/03/2019   ID:  [name], DOB May 14, 1951, MRN [id number]  Patient Location: Home Provider Location: Home  PCP:  Biagio Borg, MD  Cardiologist:  Quay Burow, MD  Electrophysiologist:  None   Evaluation Performed:  Follow-Up Visit  Chief Complaint:  followup (previously scheduled as preop clearance)  History of Present Illness:    Carolyn Valenzuela is a 68 y.o. female with PMH of HTN, HLD, DM II, and ESRD on HD, s/p bilateral BKA and CAD.  Patient was originally referred to Dr. Gwenlyn Found for evaluation of PAD.  Lower extremity arterial Doppler did not show significant obstructive disease however she has diminished TBI bilaterally suggesting the possibility of poor healing as result of surgical procedure.  She ultimately underwent bilateral below-knee amputation by Dr. Doran Durand and Dr. Sharol Given.  Patient was admitted with NSTEMI in October 2017.  Cardiac catheterization performed on 06/30/2018 demonstrated 100% ostial OM2 occlusion, 99% ostial left circumflex lesion, 40% proximal left circumflex lesion, 60% mid left circumflex lesion, 70% ostial to proximal LAD lesion, 80% distal LAD  lesion.  Given diffuse disease, patient was referred to CT surgery for consideration of bypass surgery.  Unfortunately she was turned down due to her comorbidities.  She was brought back to the Cath Lab on 07/02/2016 and underwent Synergy 2.5 x 16 mm DES to proximal left circumflex.  Postprocedure, patient was placed on aspirin and Plavix for minimum 12 months, however ideally indefinitely.  Echocardiogram obtained on 07/01/2016 showed EF 40 to 45%, hypokinesis of the lateral and inferolateral myocardium, severe AI, moderate to severe MR, PA peak pressure 51 mmHg.  Echocardiogram was repeated on 10/12/2017, this revealed EF of 35 to 60%, grade 1 DD, no significant AI or MR were noted.  Patient recently suffered a mechanical fall and was seen in the ED.  X-ray of the hand demonstrate a fracture of the fifth metacarpal.  This visit was originally scheduled as a preop clearance, however according to the patient, she has canceled the surgery because she no longer wants it.  Of note, she also took her splint off yesterday.  I advised her to follow-up with Dr. Fredna Dow.  Her main complaint today was increasing shortness of breath that has been going on for the past several months.  She denies any obvious chest pain.  Her volume is managed by hemodialysis.  The original procedure she is scheduled with Dr. Fredna Dow is considered a fairly low risk procedure.  She is considered an high risk patient going through a low risk procedure given her prior history of significant residual CAD and comorbidities.  If she is interested to have surgery again, I would clear her to proceed with the surgery after  holding Plavix for 5 days.  It is ideal for her to continue on aspirin even through orthopedic surgery.  As for her increasing dyspnea, I recommended an echocardiogram.  I suspect this is multifactorial related to deconditioning secondary to history amputation, underlying CAD.  I wish to reassess her heart valve as there was a discrepancy  between the 2 echocardiogram that was obtained in 2017 and again in 2019.   The patient does not have symptoms concerning for COVID-19 infection (fever, chills, cough, or new shortness of breath).    Past Medical History:  Diagnosis Date   Allergic rhinitis, cause unspecified 12/08/2010   ANEMIA-IRON DEFICIENCY 01/25/2008   Aortic regurgitation    CAD (coronary artery disease)    a. 07/2016 NSTEMI/PCI: LM nl, LAD 70ost, 59m/d, 80d, D1 40ost, LCX 99ost/p (2.5x16 Synergy DES - 2.75), 40/92m, Om2 100 CTO, RCA  30p/m. Pt eval by CT surgery, not felt to be suitable candidate 2/2 comorbidities.   CERVICAL RADICULOPATHY, LEFT [id number]   Chronic systolic CHF (congestive heart failure) ([place])    a. 07/2016 Echo: EF 40-45%, diff HK, sev AI, mild MVP w/ mod to sev MR, PASP 53mmHg.   Critical lower limb ischemia    DIABETES MELLITUS, UNCONTROLLED 05/21/2009   Esophagitis    ESRD on dialysis [facility])    Foot ulcer ([place])    right lateral malleolus   HCAP (healthcare-associated pneumonia) 06/29/2016   HYPERLIPIDEMIA 03/30/2007   HYPERTENSION 01/25/2008   Ischemic cardiomyopathy    a. 07/2016 Echo: EF 40-45%.   Mitral regurgitation    a. 07/2016 Echo: Mild MVP w/ mod to sev MR.   PVD (peripheral vascular disease) ([place])    a. s/p bilat BKA   SECONDARY HYPERPARATHYROIDISM 05/21/2009   Severe aortic regurgitation    a. 07/2016 Echo: Severe AI.   Past Surgical History:  Procedure Laterality Date   AMPUTATION Right 03/15/2014   Procedure: RIGHT  LEG  BELOW KNEE AMPUTATION ;  Surgeon: [name], MD;  Location: [place];  Service: Orthopedics;  Laterality: Right;   AMPUTATION Left 11/29/2015   Procedure: AMPUTATION BELOW KNEE;  Surgeon: Newt Minion, MD;  Location: [place];  Service: Orthopedics;  Laterality: Left;   AV FISTULA PLACEMENT Left    AV FISTULA PLACEMENT Left 09/06/2014   Procedure: ARTERIOVENOUS (AV) FISTULA CREATION-Left Brachiocephalic;  Surgeon: Conrad [place], MD;  Location:  [place];  Service: Vascular;  Laterality: Left;   CARDIAC CATHETERIZATION N/A 06/30/2016   Procedure: Left Heart Cath and Coronary Angiography;  Surgeon: Burnell Blanks, MD;  Location: [place] CV [facility];  Service: Cardiovascular;  Laterality: N/A;   CARDIAC CATHETERIZATION N/A 07/02/2016   Procedure: Coronary Stent Intervention;  Surgeon: Nelva Bush, MD;  Location: [place] [facility];  Service: Cardiovascular;  Laterality: N/A;   CATARACT EXTRACTION     COLONOSCOPY     CORONARY ANGIOPLASTY WITH STENT PLACEMENT  07/02/2016   ESOPHAGOGASTRODUODENOSCOPY N/A 03/20/2016   Procedure: ESOPHAGOGASTRODUODENOSCOPY (EGD);  Surgeon: Milus Banister, MD;  Location: [place];  Service: Endoscopy;  Laterality: N/A;   EYE SURGERY Bilateral    cataracts removed, left eye still has some oil in it.   INSERTION OF DIALYSIS CATHETER N/A 09/03/2014   Procedure: INSERTION OF DIALYSIS CATHETER RIGHT INTERNAL JUGULAR VEIN;  Surgeon: Conrad [place], MD;  Location: [place];  Service: Vascular;  Laterality: N/A;   PERIPHERAL VASCULAR CATHETERIZATION N/A 08/13/2015   Procedure: Abdominal Aortogram;  Surgeon: Elam Dutch, MD;  Location: [place] [facility]  [facility];  Service: Cardiovascular;  Laterality: N/A;     Current Meds  Medication Sig   acetaminophen (TYLENOL) 325 MG tablet Take 325 mg by mouth as needed.   amLODipine (NORVASC) 5 MG tablet TAKE 1 TABLET BY MOUTH EVERY DAY   aspirin 81 MG EC tablet Take 81 mg by mouth daily.     BYSTOLIC 5 MG tablet TAKE 1 TABLET BY MOUTH EVERY DAY (Patient taking differently: Take 5 mg by mouth daily. )   cinacalcet (SENSIPAR) 30 MG tablet Take 30 mg by mouth as directed. MWF on dialysis days   clopidogrel (PLAVIX) 75 MG tablet TAKE 1 TABLET BY MOUTH EVERY DAY (Patient taking differently: Take 75 mg by mouth daily. )   gabapentin (NEURONTIN) 100 MG capsule TAKE 2 CAPSULES (200 MG TOTAL) BY MOUTH 3 (THREE) TIMES DAILY.   glipiZIDE (GLUCOTROL XL) 10 MG 24 hr  tablet Take 1 tablet (10 mg total) by mouth daily with breakfast.   glucose blood (ONE TOUCH ULTRA TEST) test strip Use as instructed, twice daily code E11.52   JANUVIA 100 MG tablet TAKE 1 TABLET BY MOUTH EVERY DAY (Patient taking differently: Take 100 mg by mouth daily. )   lidocaine-prilocaine (EMLA) cream APPLY A SMALL AMOUNT TO SKIN 3 TIMES A WEEK OVER DIALYSIS SHUNT 30 MINUTES PRIOR TO DIALYSIS (Patient taking differently: Apply 1 application topically as needed. APPLY A SMALL AMOUNT TO SKIN 3 TIMES A WEEK OVER DIALYSIS SHUNT 30 MINUTES PRIOR TO DIALYSIS)   multivitamin (RENA-VIT) TABS tablet TAKE 1 TABLET BY MOUTH EVERYDAY AT BEDTIME (Patient taking differently: Take 1 tablet by mouth at bedtime. )   ONETOUCH VERIO test strip USE TO CHECK BLOOD SUGARS TWICE A DAY DX E11.9   pantoprazole (PROTONIX) 40 MG tablet TAKE 1 TABLET BY MOUTH TWICE A DAY   VELPHORO 500 MG chewable tablet Chew 1,000 mg by mouth 3 (three) times daily.   Current Facility-Administered Medications for the 05/01/19 encounter (Telemedicine) with Almyra Deforest, PA  Medication   triamcinolone acetonide (TRIESENCE) 40 MG/ML subtenons injection 4 mg     Allergies:   Patient has no known allergies.   Social History   Tobacco Use   Smoking status: Never Smoker   Smokeless tobacco: Never Used  Substance Use Topics   Alcohol use: No    Alcohol/week: 0.0 standard drinks   Drug use: No     Family Hx: The patient's family history includes Cancer in her mother.  ROS:   Please see the history of present illness.     All other systems reviewed and are negative.   Prior CV studies:   The following studies were reviewed today:  Cath 06/30/2016  Prox RCA to Mid RCA lesion, 30 %stenosed.  Ost 2nd Mrg to 2nd Mrg lesion, 100 %stenosed.  Ost Cx to Prox Cx lesion, 99 %stenosed.  Prox Cx to Mid Cx lesion, 40 %stenosed.  Mid Cx lesion, 60 %stenosed.  Ost 1st Diag to 1st Diag lesion, 40 %stenosed.  Ost LAD  to Prox LAD lesion, 70 %stenosed.  Mid LAD lesion, 40 %stenosed.  Dist LAD-2 lesion, 80 %stenosed.  Dist LAD-1 lesion, 40 %stenosed.   1. Unstable angina/NSTEMI 2. Severe double vessel CAD 3. The Circumflex is a moderate caliber vessel with severe proximal stenosis. There is moderate calcific disease in the mid AV groove segment of the vessel. There is a small to moderate caliber obtuse marginal branch that is occluded at the ostium and fills from left to left and  right to left collaterals.  4. The LAD is large caliber vessel that courses to the apex. There is a moderately severe eccentric stenosis extending from the ostium into the proximal segment. The mid vessel has moderate disease. The distal segment has a severe stenosis just before the vessel wraps around the apex.  5. The RCA is a small to moderate caliber co-dominant vessel with moderate non-obstructive disease.   Recommendations: She has severe diffuse CAD. She is a diabetic on hemodialysis and the favorable approach to revascularization would be with bypass surgery. She may not be an optimal candidate for bypass especially given her bilateral lower extremity amputations. I think we need to have our CT surgery team see her to discuss bypass as an option. If she is not felt to be a candidate for bypass surgery, we could offer stenting of the proximal Circumflex which may require rotational atherectomy given the extent of calcification in this segment of the vessel.    Cath 07/02/2016 Conclusions: 1. Significant multivessel coronary artery disease, as detailed in diagnostic angiography report from 06/30/16, including 99% proximal LCx stenosis and chronic total occlusion of OM2. 2. Successful PCI to 99% proximal LCx stenosis using a Synergy 2.5 x 16 mm drug-eluting stent with 0% residual stenosis and TIMI-3 flow.  Recommendations: 1. Remove right femoral artery sheath 2 hours after discontinuation of bivalirudin. 2. Continue dual  antiplatelet therapy with aspirin and clopidogrel for at least 12 months, ideally indefinitely. 3. Aggressive medical management and secondary prevention of other coronary artery disease.   Echo 10/12/2017 LV EF: 55% -   60% Study Conclusions  - Left ventricle: The cavity size was normal. There was mild focal   basal hypertrophy of the septum. Systolic function was normal.   The estimated ejection fraction was in the range of 55% to 60%.   Wall motion was normal; there were no regional wall motion   abnormalities. There was an increased relative contribution of   atrial contraction to ventricular filling. Doppler parameters are   consistent with abnormal left ventricular relaxation (grade 1   diastolic dysfunction). Doppler parameters are consistent with   high ventricular filling pressure. - Aortic valve: Trileaflet; normal thickness, mildly calcified   leaflets. - Mitral valve: There was trivial regurgitation.  Impressions:  - Normal LVF with Grade 1 DD and increase LV filling pressure, mild   TR. Compared to prior echo, there is no significant MR or AR.   Labs/Other Tests and Data Reviewed:    EKG:  An ECG dated 04/21/2019 was personally reviewed today and demonstrated:  Normal sinus rhythm without significant ST-T wave changes  Recent Labs: 01/31/2019: ALT 8; TSH 2.51 04/21/2019: BUN 6; Creatinine, Ser 2.65; Hemoglobin 10.1; Platelets 288; Potassium 3.2; Sodium 139   Recent Lipid Panel Lab Results  Component Value Date/Time   CHOL 190 01/31/2019 11:42 AM   TRIG 163.0 (H) 01/31/2019 11:42 AM   HDL 40.00 01/31/2019 11:42 AM   CHOLHDL 5 01/31/2019 11:42 AM   LDLCALC 117 (H) 01/31/2019 11:42 AM   LDLDIRECT 86.0 04/06/2017 11:31 AM    Wt Readings from Last 3 Encounters:  01/31/19 136 lb (61.7 kg)  07/07/18 139 lb 1.9 oz (63.1 kg)  04/07/18 140 lb (63.5 kg)     Objective:    Vital Signs:  Ht 4\' 11"  (1.499 m)    BMI 30.30 kg/m     per patient report, her blood  pressure was stable during dialysis today  ASSESSMENT & PLAN:  1. Dyspnea on exertion: I suspect this is multifactorial related to deconditioning and underlying CAD.  Although I do recommend echocardiogram to reassess her heart valve as there was a discrepancy between the previous echocardiogram in 2019 and 2017.  2. CAD: Previous PCI was in 2017.  Note, patient did have significant residual disease.  3. S/p bilateral BKA: Despite so, patient is able to get around at home  4. Hypertension: No vital signs was able to be obtained today although patient says her blood pressure was stable during dialysis.  She has a history of high blood pressure.  Management of the blood pressure has been difficult given dialysis.  5. Hyperlipidemia: She is not on a statin.  Will need to reassess on follow-up  6. DM 2: Managed by primary care provider  7. End-stage renal disease on HD MWF: Managed by nephrology service  8. Left fifth metacarpal fracture: Patient was placed in splint in the ED, however she has taken herself off of the splint due to discomfort yesterday.  She says she no longer wish to have surgery and has called Dr. Levell July office today to cancel her surgery on 9/2. For some reason, she is still on the OR list, I asked her to verify with Dr. Fredna Dow. Today's office visit was originally set up as a preop clearance however patient is unwilling to proceed with surgery at this time.  I recommend she follow-up with Dr. Fredna Dow closely.  She will be a high risk patient proceeding with a low risk procedure given significant residual CAD that was present previously.  If she does change her mind, prior to her hand surgery, she may hold Plavix for 5 days   COVID-19 Education: The signs and symptoms of COVID-19 were discussed with the patient and how to seek care for testing (follow up with PCP or arrange E-visit).  The importance of social distancing was discussed today.  Time:   Today, I have spent 19  minutes with the patient with telehealth technology discussing the above problems.     Medication Adjustments/Labs and Tests Ordered: Current medicines are reviewed at length with the patient today.  Concerns regarding medicines are outlined above.   Tests Ordered: Orders Placed This Encounter  Procedures   ECHOCARDIOGRAM COMPLETE    Medication Changes: No orders of the defined types were placed in this encounter.   Follow Up:  In Person in 2 month(s)  Signed, Almyra Deforest, [place]  05/03/2019 12:32 AM    [place]

## 2019-05-01 NOTE — Patient Instructions (Signed)
Medication Instructions:  Your physician recommends that you continue on your current medications as directed. Please refer to the Current Medication list given to you today.  If you need a refill on your cardiac medications before your next appointment, please call your pharmacy.   Lab work: NONE ordered at this time of appointment   If you have labs (blood work) drawn today and your tests are completely normal, you will receive your results only by: [name] MyChart Message (if you have MyChart) OR . A paper copy in the mail If you have any lab test that is abnormal or we need to change your treatment, we will call you to review the results.  Testing/Procedures: Your physician has requested that you have an echocardiogram. Echocardiography is a painless test that uses sound waves to create images of your heart. It provides your doctor with information about the size and shape of your heart and how well your heart's chambers and valves are working. This procedure takes approximately one hour. There are no restrictions for this procedure.  Follow-Up: At [facility], you and your health needs are our priority.  As part of our continuing mission to provide you with exceptional heart care, we have created designated Provider Care Teams.  These Care Teams include your primary Cardiologist (physician) and Advanced Practice Providers (APPs -  Physician Assistants and Nurse Practitioners) who all work together to provide you with the care you need, when you need it. You will need a follow up appointment in 1-2 months with Quay Burow, MD or Almyra Deforest PA-C  Any Other Special Instructions Will Be Listed Below (If Applicable).

## 2019-05-01 NOTE — Telephone Encounter (Signed)
Pt has an appt today w/ Almyra Deforest, PA-C for surgical clearance. I have spoken to Mr. Eulas Post today and he will fax clearance to requesting provider once cleared. I will remove encounter from preop pool.

## 2019-05-01 NOTE — Progress Notes (Addendum)
Patient contacted due to missed [place] appointment, patient stated that she is cancelling her surgery and that she has already called the office

## 2019-05-01 NOTE — Progress Notes (Signed)
This encounter was created in error - please disregard.

## 2019-05-02 ENCOUNTER — Ambulatory Visit: Payer: Medicare Other | Admitting: Physician Assistant

## 2019-05-02 ENCOUNTER — Encounter (HOSPITAL_COMMUNITY): Payer: Self-pay | Admitting: Certified Registered"

## 2019-05-04 ENCOUNTER — Ambulatory Visit (HOSPITAL_BASED_OUTPATIENT_CLINIC_OR_DEPARTMENT_OTHER): Admission: RE | Admit: 2019-05-04 | Payer: Medicare Other | Source: Home / Self Care | Admitting: Orthopedic Surgery

## 2019-05-04 SURGERY — OPEN REDUCTION INTERNAL FIXATION (ORIF) METACARPAL
Anesthesia: Choice | Site: Finger | Laterality: Left

## 2019-05-25 ENCOUNTER — Other Ambulatory Visit: Payer: Self-pay

## 2019-05-25 ENCOUNTER — Encounter (INDEPENDENT_AMBULATORY_CARE_PROVIDER_SITE_OTHER): Payer: Self-pay

## 2019-05-25 ENCOUNTER — Ambulatory Visit (HOSPITAL_COMMUNITY): Payer: Medicare Other | Attending: Internal Medicine

## 2019-05-25 DIAGNOSIS — R0602 Shortness of breath: Secondary | ICD-10-CM | POA: Diagnosis present

## 2019-05-27 ENCOUNTER — Other Ambulatory Visit: Payer: Self-pay | Admitting: Internal Medicine

## 2019-05-31 NOTE — Progress Notes (Signed)
This encounter was created in error - please disregard.

## 2019-06-07 ENCOUNTER — Telehealth: Payer: Self-pay

## 2019-06-07 NOTE — Telephone Encounter (Addendum)
Tried calling patient to give results of Echo no answer on her number the phone just rings will try calling the patient again.    ----- Message from Almyra Deforest, [place] sent at 05/26/2019  3:51 PM EDT ----- Doristine Devoid pumping function of heart, no significant valve issue noted. The moderate to severe mitral valve leakage that was seen on previous echo in 2017 is no longer present. Overall very reassuring echo. As I mentioned in the recent visit, I suspect her shortness of breath is multifactorial, related to both deconditioning and underlying residual CAD

## 2019-06-15 ENCOUNTER — Other Ambulatory Visit: Payer: Self-pay | Admitting: Cardiovascular Disease

## 2019-06-15 ENCOUNTER — Other Ambulatory Visit: Payer: Self-pay | Admitting: Internal Medicine

## 2019-06-23 ENCOUNTER — Other Ambulatory Visit: Payer: Self-pay | Admitting: Internal Medicine

## 2019-06-29 ENCOUNTER — Other Ambulatory Visit: Payer: Self-pay | Admitting: Internal Medicine

## 2019-06-29 ENCOUNTER — Other Ambulatory Visit: Payer: Self-pay | Admitting: Cardiovascular Disease

## 2019-06-29 MED ORDER — CLOPIDOGREL BISULFATE 75 MG PO TABS: 75.0000 mg | ORAL_TABLET | Freq: Every day | ORAL | 1 refills | Status: DC

## 2019-06-29 NOTE — Telephone Encounter (Signed)
°*  STAT* If patient is at the pharmacy, call can be transferred to refill team.   1. Which medications need to be refilled? (please list name of each medication and dose if known) clopidogrel (PLAVIX) 75 MG tablet  2. Which pharmacy/location (including street and city if local pharmacy) is medication to be sent to?  [facility] [address] [facility] need [facility] 30 day or 90 day supply? [place]

## 2019-06-29 NOTE — Telephone Encounter (Signed)
Rx request sent to pharmacy.  

## 2019-07-18 ENCOUNTER — Other Ambulatory Visit: Payer: Self-pay | Admitting: Cardiovascular Disease

## 2019-07-18 DIAGNOSIS — I10 Essential (primary) hypertension: Secondary | ICD-10-CM

## 2019-08-01 ENCOUNTER — Ambulatory Visit: Payer: Medicare Other | Admitting: Internal Medicine

## 2019-08-29 ENCOUNTER — Telehealth: Payer: Self-pay

## 2019-08-29 NOTE — Telephone Encounter (Signed)
Please advise 

## 2019-08-29 NOTE — Telephone Encounter (Signed)
Copied from [place] [id number]. Topic: General - Other >> Aug 29, 2019 10:07 AM Jodie Echevaria wrote: Reason for CRM: Patient called to inquire from Dr Jenny Reichmann about getting her some help from a home care agency that can come to her home and help her during the week. Asking if Dr Jenny Reichmann can recommend someone and start the process for her. Please contact patient at Ph# [phone]

## 2019-08-29 NOTE — Telephone Encounter (Signed)
Needs ROV as the rule is pt has to be seen within 90 days to order this

## 2019-08-30 NOTE — Telephone Encounter (Signed)
Patient will call back to schedule ?

## 2019-08-30 NOTE — Telephone Encounter (Signed)
Forwarding msg to scheduler to contact pt to schedule appt.Marland KitchenJohny Chess

## 2019-09-02 DIAGNOSIS — N186 End stage renal disease: Secondary | ICD-10-CM | POA: Diagnosis not present

## 2019-09-02 DIAGNOSIS — N2581 Secondary hyperparathyroidism of renal origin: Secondary | ICD-10-CM | POA: Diagnosis not present

## 2019-09-02 DIAGNOSIS — Z992 Dependence on renal dialysis: Secondary | ICD-10-CM | POA: Diagnosis not present

## 2019-09-04 DIAGNOSIS — Z992 Dependence on renal dialysis: Secondary | ICD-10-CM | POA: Diagnosis not present

## 2019-09-04 DIAGNOSIS — N186 End stage renal disease: Secondary | ICD-10-CM | POA: Diagnosis not present

## 2019-09-04 DIAGNOSIS — N2581 Secondary hyperparathyroidism of renal origin: Secondary | ICD-10-CM | POA: Diagnosis not present

## 2019-09-06 ENCOUNTER — Ambulatory Visit: Payer: Medicare Other | Admitting: Internal Medicine

## 2019-09-06 DIAGNOSIS — Z992 Dependence on renal dialysis: Secondary | ICD-10-CM | POA: Diagnosis not present

## 2019-09-06 DIAGNOSIS — N2581 Secondary hyperparathyroidism of renal origin: Secondary | ICD-10-CM | POA: Diagnosis not present

## 2019-09-06 DIAGNOSIS — N186 End stage renal disease: Secondary | ICD-10-CM | POA: Diagnosis not present

## 2019-09-07 ENCOUNTER — Other Ambulatory Visit: Payer: Self-pay

## 2019-09-07 ENCOUNTER — Ambulatory Visit (INDEPENDENT_AMBULATORY_CARE_PROVIDER_SITE_OTHER): Payer: Medicare PPO | Admitting: Internal Medicine

## 2019-09-07 ENCOUNTER — Telehealth: Payer: Self-pay | Admitting: *Deleted

## 2019-09-07 ENCOUNTER — Encounter: Payer: Self-pay | Admitting: Internal Medicine

## 2019-09-07 ENCOUNTER — Telehealth: Payer: Self-pay | Admitting: Internal Medicine

## 2019-09-07 VITALS — BP 138/68 | HR 69 | Temp 98.4°F | Resp 12 | Ht <= 58 in | Wt 133.0 lb

## 2019-09-07 DIAGNOSIS — Z0000 Encounter for general adult medical examination without abnormal findings: Secondary | ICD-10-CM

## 2019-09-07 DIAGNOSIS — E1142 Type 2 diabetes mellitus with diabetic polyneuropathy: Secondary | ICD-10-CM | POA: Diagnosis not present

## 2019-09-07 LAB — CBC WITH DIFFERENTIAL/PLATELET
Basophils Absolute: 0.1 10*3/uL (ref 0.0–0.1)
Basophils Relative: 1.3 % (ref 0.0–3.0)
Eosinophils Absolute: 0.2 10*3/uL (ref 0.0–0.7)
Eosinophils Relative: 1.9 % (ref 0.0–5.0)
HCT: 30.1 % — ABNORMAL LOW (ref 36.0–46.0)
Hemoglobin: 10.1 g/dL — ABNORMAL LOW (ref 12.0–15.0)
Lymphocytes Relative: 25 % (ref 12.0–46.0)
Lymphs Abs: 2.2 10*3/uL (ref 0.7–4.0)
MCHC: 33.5 g/dL (ref 30.0–36.0)
MCV: 97.5 fl (ref 78.0–100.0)
Monocytes Absolute: 0.6 10*3/uL (ref 0.1–1.0)
Monocytes Relative: 7 % (ref 3.0–12.0)
Neutro Abs: 5.7 10*3/uL (ref 1.4–7.7)
Neutrophils Relative %: 64.8 % (ref 43.0–77.0)
Platelets: 245 10*3/uL (ref 150.0–400.0)
RBC: 3.09 Mil/uL — ABNORMAL LOW (ref 3.87–5.11)
RDW: 17.6 % — ABNORMAL HIGH (ref 11.5–15.5)
WBC: 8.7 10*3/uL (ref 4.0–10.5)

## 2019-09-07 LAB — HEPATIC FUNCTION PANEL
ALT: 9 U/L (ref 0–35)
AST: 15 U/L (ref 0–37)
Albumin: 4.1 g/dL (ref 3.5–5.2)
Alkaline Phosphatase: 80 U/L (ref 39–117)
Bilirubin, Direct: 0 mg/dL (ref 0.0–0.3)
Total Bilirubin: 0.4 mg/dL (ref 0.2–1.2)
Total Protein: 7.4 g/dL (ref 6.0–8.3)

## 2019-09-07 LAB — BASIC METABOLIC PANEL
BUN: 25 mg/dL — ABNORMAL HIGH (ref 6–23)
CO2: 34 mEq/L — ABNORMAL HIGH (ref 19–32)
Calcium: 9.1 mg/dL (ref 8.4–10.5)
Chloride: 91 mEq/L — ABNORMAL LOW (ref 96–112)
Creatinine, Ser: 5.68 mg/dL (ref 0.40–1.20)
GFR: 9.01 mL/min — CL (ref >60.00)
Glucose, Bld: 309 mg/dL — ABNORMAL HIGH (ref 70–99)
Potassium: 4.1 mEq/L (ref 3.5–5.1)
Sodium: 136 mEq/L (ref 135–145)

## 2019-09-07 LAB — LIPID PANEL
Cholesterol: 198 mg/dL (ref 0–200)
HDL: 41.8 mg/dL (ref >39.00)
LDL Cholesterol: 130 mg/dL — ABNORMAL HIGH (ref 0–99)
NonHDL: 156.22
Total CHOL/HDL Ratio: 5
Triglycerides: 130 mg/dL (ref 0.0–149.0)
VLDL: 26 mg/dL (ref 0.0–40.0)

## 2019-09-07 LAB — HEMOGLOBIN A1C: Hgb A1c MFr Bld: 7.7 % — ABNORMAL HIGH (ref 4.6–6.5)

## 2019-09-07 LAB — TSH: TSH: 1.76 u[IU]/mL (ref 0.35–4.50)

## 2019-09-07 NOTE — Telephone Encounter (Signed)
    Lab calling with critical lab result: Creatinine 5.69 GFR 8.99  Hope from the Lab [phone]

## 2019-09-07 NOTE — Patient Instructions (Addendum)
You will be contacted regarding the referral for: Dr Zadie Rhine  Please continue all other medications as before, and refills have been done if requested.  Please have the pharmacy call with any other refills you may need.  Please continue your efforts at being more active, low cholesterol diet, and weight control.  You are otherwise up to date with prevention measures today.  Please keep your appointments with your specialists as you may have planned  Please go to the LAB at the blood drawing area for the tests to be done  You will be contacted by phone if any changes need to be made immediately.  Otherwise, you will receive a letter about your results with an explanation, but please check with MyChart first.  Please remember to sign up for MyChart if you have not done so, as this will be important to you in the future with finding out test results, communicating by private email, and scheduling acute appointments online when needed.  Please return in 6 months, or sooner if needed

## 2019-09-07 NOTE — Progress Notes (Signed)
Subjective:    Patient ID: Carolyn Valenzuela, female    DOB: 04/15/1951, 69 y.o.   MRN: [id number]  HPI   Here for wellness and f/u;  Overall doing ok;  Pt denies Chest pain, worsening SOB, DOE, wheezing, orthopnea, PND, worsening LE edema, palpitations, dizziness or syncope.  Pt denies neurological change such as new headache, facial or extremity weakness.  Pt denies polydipsia, polyuria, or low sugar symptoms. Pt states overall good compliance with treatment and medications, good tolerability, and has been trying to follow appropriate diet.  Pt denies worsening depressive symptoms, suicidal ideation or panic. No fever, night sweats, wt loss, loss of appetite, or other constitutional symptoms.  Pt states good ability with ADL's, has low fall risk, home safety reviewed and adequate, no other significant changes in hearing or vision, and only occasionally active with exercise. Pt is asking for [facility] only bc she is wanting her housework done for her that she can no longer do such as vacuum and mopping, and assisting with making food.   Needs to see optho - has seen Dr Zadie Rhine in past Past Medical History:  Diagnosis Date  . Allergic rhinitis, cause unspecified 12/08/2010  . ANEMIA-IRON DEFICIENCY 01/25/2008  . Aortic regurgitation   . CAD (coronary artery disease)    a. 07/2016 NSTEMI/PCI: LM nl, LAD 70ost, 39m/d, 80d, D1 40ost, LCX 99ost/p (2.5x16 Synergy DES - 2.75), 40/76m, Om2 100 CTO, RCA  30p/m. Pt eval by CT surgery, not felt to be suitable candidate 2/2 comorbidities.  . CERVICAL RADICULOPATHY, LEFT 01/25/2008  . Chronic systolic CHF (congestive heart failure) ([place])    a. 07/2016 Echo: EF 40-45%, diff HK, sev AI, mild MVP w/ mod to sev MR, PASP 60mmHg.  . Critical lower limb ischemia   . DIABETES MELLITUS, UNCONTROLLED 05/21/2009  . Esophagitis   . ESRD on dialysis ([place])   . Foot ulcer ([place])    right lateral malleolus  . HCAP (healthcare-associated pneumonia) 06/29/2016  . HYPERLIPIDEMIA 03/30/2007   . HYPERTENSION 01/25/2008  . Ischemic cardiomyopathy    a. 07/2016 Echo: EF 40-45%.  . Mitral regurgitation    a. 07/2016 Echo: Mild MVP w/ mod to sev MR.  [name] PVD (peripheral vascular disease) ([place])    a. s/p bilat BKA  . SECONDARY HYPERPARATHYROIDISM 05/21/2009  . Severe aortic regurgitation    a. 07/2016 Echo: Severe AI.   Past Surgical History:  Procedure Laterality Date  . AMPUTATION Right 03/15/2014   Procedure: RIGHT  LEG  BELOW KNEE AMPUTATION ;  Surgeon: [name], MD;  Location: [place];  Service: Orthopedics;  Laterality: Right;  . AMPUTATION Left 11/29/2015   Procedure: AMPUTATION BELOW KNEE;  Surgeon: Newt Minion, MD;  Location: [place];  Service: Orthopedics;  Laterality: Left;  . AV FISTULA PLACEMENT Left   . AV FISTULA PLACEMENT Left 09/06/2014   Procedure: ARTERIOVENOUS (AV) FISTULA CREATION-Left Brachiocephalic;  Surgeon: Conrad Matherville, MD;  Location: [place];  Service: Vascular;  Laterality: Left;  . CARDIAC CATHETERIZATION N/A 06/30/2016   Procedure: Left Heart Cath and Coronary Angiography;  Surgeon: Burnell Blanks, MD;  Location: [facility];  Service: Cardiovascular;  Laterality: N/A;  . CARDIAC CATHETERIZATION N/A 07/02/2016   Procedure: Coronary Stent Intervention;  Surgeon: Nelva Bush, MD;  Location: [facility];  Service: Cardiovascular;  Laterality: N/A;  . CATARACT EXTRACTION    . COLONOSCOPY    . CORONARY ANGIOPLASTY WITH STENT PLACEMENT  07/02/2016  . ESOPHAGOGASTRODUODENOSCOPY N/A 03/20/2016  Procedure: ESOPHAGOGASTRODUODENOSCOPY (EGD);  Surgeon: Milus Banister, MD;  Location: [place];  Service: Endoscopy;  Laterality: N/A;  . EYE SURGERY Bilateral    cataracts removed, left eye still has some oil in it.  . INSERTION OF DIALYSIS CATHETER N/A 09/03/2014   Procedure: INSERTION OF DIALYSIS CATHETER RIGHT INTERNAL JUGULAR VEIN;  Surgeon: Conrad [place] Ridge, MD;  Location: [place];  Service: Vascular;  Laterality: N/A;  . PERIPHERAL VASCULAR  CATHETERIZATION N/A 08/13/2015   Procedure: Abdominal Aortogram;  Surgeon: Elam Dutch, MD;  Location: [facility];  Service: Cardiovascular;  Laterality: N/A;    reports that she has never smoked. She has never used smokeless tobacco. She reports that she does not drink alcohol or use drugs. family history includes Cancer in her mother. No Known Allergies Current Outpatient Medications on File Prior to Visit  Medication Sig Dispense Refill  . acetaminophen (TYLENOL) 325 MG tablet Take 325 mg by mouth as needed.    [name] amLODipine (NORVASC) 5 MG tablet TAKE 1 TABLET BY MOUTH EVERY DAY 90 tablet 1  . aspirin 81 MG EC tablet Take 81 mg by mouth daily.      [name] BYSTOLIC 5 MG tablet TAKE 1 TABLET BY MOUTH EVERY DAY (Patient taking differently: Take 5 mg by mouth daily. ) 30 tablet 11  . cinacalcet (SENSIPAR) 30 MG tablet Take 30 mg by mouth as directed. MWF on dialysis days  5  . clopidogrel (PLAVIX) 75 MG tablet Take 1 tablet (75 mg total) by mouth daily. 90 tablet 1  . famotidine (PEPCID) 20 MG tablet TAKE 1 TABLET BY MOUTH EVERY DAY 90 tablet 0  . gabapentin (NEURONTIN) 100 MG capsule TAKE 2 CAPSULES (200 MG TOTAL) BY MOUTH 3 (THREE) TIMES DAILY. 540 capsule 0  . glipiZIDE (GLUCOTROL XL) 10 MG 24 hr tablet Take 1 tablet (10 mg total) by mouth daily with breakfast. 90 tablet 1  . glucose blood (ONE TOUCH ULTRA TEST) test strip Use as instructed, twice daily code E11.52 200 each 12  . JANUVIA 100 MG tablet TAKE 1 TABLET BY MOUTH EVERY DAY 90 tablet 1  . lidocaine-prilocaine (EMLA) cream APPLY A SMALL AMOUNT TO SKIN 3 TIMES A WEEK OVER DIALYSIS SHUNT 30 MINUTES PRIOR TO DIALYSIS (Patient taking differently: Apply 1 application topically as needed. APPLY A SMALL AMOUNT TO SKIN 3 TIMES A WEEK OVER DIALYSIS SHUNT 30 MINUTES PRIOR TO DIALYSIS) 30 g 5  . multivitamin (RENA-VIT) TABS tablet TAKE 1 TABLET BY MOUTH EVERYDAY AT BEDTIME 90 tablet 0  . ONETOUCH VERIO test strip USE TO CHECK BLOOD SUGARS  TWICE A DAY DX E11.9 100 each 5  . pantoprazole (PROTONIX) 40 MG tablet TAKE 1 TABLET BY MOUTH TWICE A DAY 180 tablet 1  . VELPHORO 500 MG chewable tablet Chew 1,000 mg by mouth 3 (three) times daily.     Current Facility-Administered Medications on File Prior to Visit  Medication Dose Route Frequency Provider Last Rate Last Admin  . triamcinolone acetonide (TRIESENCE) 40 MG/ML subtenons injection 4 mg  4 mg Intravitreal  Bernarda Caffey, MD   4 mg at 02/24/18 1122   Review of Systems Constitutional: Negative for other unusual diaphoresis, sweats, appetite or weight changes HENT: Negative for other worsening hearing loss, ear pain, facial swelling, mouth sores or neck stiffness.   Eyes: Negative for other worsening pain, redness or other visual disturbance.  Respiratory: Negative for other stridor or swelling Cardiovascular: Negative for other palpitations or other chest pain  Gastrointestinal: Negative for worsening diarrhea or loose stools, blood in stool, distention or other pain Genitourinary: Negative for hematuria, flank pain or other change in urine volume.  Musculoskeletal: Negative for myalgias or other joint swelling.  Skin: Negative for other color change, or other wound or worsening drainage.  Neurological: Negative for other syncope or numbness. Hematological: Negative for other adenopathy or swelling Psychiatric/Behavioral: Negative for hallucinations, other worsening agitation, SI, self-injury, or new decreased concentration All otherwise neg per pt     Objective:   Physical Exam BP 138/68   Pulse 69   Temp 98.4 F (36.9 C)   Resp 12   Ht 4\' 10"  (1.473 m)   Wt 133 lb (60.3 kg)   SpO2 99%   BMI 27.80 kg/m  VS noted, walks with walker Constitutional: Pt is oriented to person, place, and time. Appears well-developed and well-nourished, in no significant distress and comfortable Head: Normocephalic and atraumatic  Eyes: Conjunctivae and EOM are normal. Pupils are  equal, round, and reactive to light Right Ear: External ear normal without discharge Left Ear: External ear normal without discharge Nose: Nose without discharge or deformity Mouth/Throat: Oropharynx is without other ulcerations and moist  Neck: Normal range of motion. Neck supple. No JVD present. No tracheal deviation present or significant neck LA or mass Cardiovascular: Normal rate, regular rhythm, normal heart sounds and intact distal pulses.   Pulmonary/Chest: WOB normal and breath sounds without rales or wheezing  Abdominal: Soft. Bowel sounds are normal. NT. No HSM  Musculoskeletal: Normal range of motion. Exhibits no edema Lymphadenopathy: Has no other cervical adenopathy.  Neurological: Pt is alert and oriented to person, place, and time. Pt has normal reflexes. No cranial nerve deficit. Motor grossly intact, Gait intact Skin: Skin is warm and dry. No rash noted or new ulcerations Psychiatric:  Has normal mood and affect. Behavior is normal without agitation], more nervous All otherwise neg per pt  Lab Results  Component Value Date   WBC 6.0 04/21/2019   HGB 10.1 (L) 04/21/2019   HCT 30.5 (L) 04/21/2019   PLT 288 04/21/2019   GLUCOSE 143 (H) 04/21/2019   CHOL 190 01/31/2019   TRIG 163.0 (H) 01/31/2019   HDL 40.00 01/31/2019   LDLDIRECT 86.0 04/06/2017   LDLCALC 117 (H) 01/31/2019   ALT 8 01/31/2019   AST 15 01/31/2019   NA 139 04/21/2019   K 3.2 (L) 04/21/2019   CL 96 (L) 04/21/2019   CREATININE 2.65 (H) 04/21/2019   BUN 6 (L) 04/21/2019   CO2 30 04/21/2019   TSH 2.51 01/31/2019   INR 1.06 06/30/2016   HGBA1C 8.0 (H) 01/31/2019   MICROALBUR 108.1 (H) 09/15/2016       Assessment & Plan:

## 2019-09-07 NOTE — Telephone Encounter (Signed)
Pt requesting for Home Health care. Pt stated living alone and needed help.

## 2019-09-08 DIAGNOSIS — N186 End stage renal disease: Secondary | ICD-10-CM | POA: Diagnosis not present

## 2019-09-08 DIAGNOSIS — N2581 Secondary hyperparathyroidism of renal origin: Secondary | ICD-10-CM | POA: Diagnosis not present

## 2019-09-08 DIAGNOSIS — Z992 Dependence on renal dialysis: Secondary | ICD-10-CM | POA: Diagnosis not present

## 2019-09-08 NOTE — Telephone Encounter (Signed)
PCP reviewed labs on 09/07/19. A letter has been sent to pt.

## 2019-09-09 ENCOUNTER — Encounter: Payer: Self-pay | Admitting: Internal Medicine

## 2019-09-09 NOTE — Assessment & Plan Note (Signed)
stable overall by history and exam, recent data reviewed with pt, and pt to continue medical treatment as before,  to f/u any worsening symptoms or concerns  

## 2019-09-09 NOTE — Assessment & Plan Note (Signed)

## 2019-09-11 DIAGNOSIS — N2581 Secondary hyperparathyroidism of renal origin: Secondary | ICD-10-CM | POA: Diagnosis not present

## 2019-09-11 DIAGNOSIS — Z992 Dependence on renal dialysis: Secondary | ICD-10-CM | POA: Diagnosis not present

## 2019-09-11 DIAGNOSIS — N186 End stage renal disease: Secondary | ICD-10-CM | POA: Diagnosis not present

## 2019-09-13 DIAGNOSIS — N186 End stage renal disease: Secondary | ICD-10-CM | POA: Diagnosis not present

## 2019-09-13 DIAGNOSIS — N2581 Secondary hyperparathyroidism of renal origin: Secondary | ICD-10-CM | POA: Diagnosis not present

## 2019-09-13 DIAGNOSIS — Z992 Dependence on renal dialysis: Secondary | ICD-10-CM | POA: Diagnosis not present

## 2019-09-15 DIAGNOSIS — N2581 Secondary hyperparathyroidism of renal origin: Secondary | ICD-10-CM | POA: Diagnosis not present

## 2019-09-15 DIAGNOSIS — Z992 Dependence on renal dialysis: Secondary | ICD-10-CM | POA: Diagnosis not present

## 2019-09-15 DIAGNOSIS — N186 End stage renal disease: Secondary | ICD-10-CM | POA: Diagnosis not present

## 2019-09-17 ENCOUNTER — Other Ambulatory Visit: Payer: Self-pay | Admitting: Internal Medicine

## 2019-09-18 DIAGNOSIS — N186 End stage renal disease: Secondary | ICD-10-CM | POA: Diagnosis not present

## 2019-09-18 DIAGNOSIS — Z992 Dependence on renal dialysis: Secondary | ICD-10-CM | POA: Diagnosis not present

## 2019-09-18 DIAGNOSIS — N2581 Secondary hyperparathyroidism of renal origin: Secondary | ICD-10-CM | POA: Diagnosis not present

## 2019-09-20 DIAGNOSIS — Z992 Dependence on renal dialysis: Secondary | ICD-10-CM | POA: Diagnosis not present

## 2019-09-20 DIAGNOSIS — N186 End stage renal disease: Secondary | ICD-10-CM | POA: Diagnosis not present

## 2019-09-20 DIAGNOSIS — N2581 Secondary hyperparathyroidism of renal origin: Secondary | ICD-10-CM | POA: Diagnosis not present

## 2019-09-22 DIAGNOSIS — N186 End stage renal disease: Secondary | ICD-10-CM | POA: Diagnosis not present

## 2019-09-22 DIAGNOSIS — N2581 Secondary hyperparathyroidism of renal origin: Secondary | ICD-10-CM | POA: Diagnosis not present

## 2019-09-22 DIAGNOSIS — Z992 Dependence on renal dialysis: Secondary | ICD-10-CM | POA: Diagnosis not present

## 2019-09-25 DIAGNOSIS — N2581 Secondary hyperparathyroidism of renal origin: Secondary | ICD-10-CM | POA: Diagnosis not present

## 2019-09-25 DIAGNOSIS — Z992 Dependence on renal dialysis: Secondary | ICD-10-CM | POA: Diagnosis not present

## 2019-09-25 DIAGNOSIS — N186 End stage renal disease: Secondary | ICD-10-CM | POA: Diagnosis not present

## 2019-09-27 DIAGNOSIS — Z992 Dependence on renal dialysis: Secondary | ICD-10-CM | POA: Diagnosis not present

## 2019-09-27 DIAGNOSIS — N2581 Secondary hyperparathyroidism of renal origin: Secondary | ICD-10-CM | POA: Diagnosis not present

## 2019-09-27 DIAGNOSIS — N186 End stage renal disease: Secondary | ICD-10-CM | POA: Diagnosis not present

## 2019-09-29 DIAGNOSIS — Z992 Dependence on renal dialysis: Secondary | ICD-10-CM | POA: Diagnosis not present

## 2019-09-29 DIAGNOSIS — N2581 Secondary hyperparathyroidism of renal origin: Secondary | ICD-10-CM | POA: Diagnosis not present

## 2019-09-29 DIAGNOSIS — N186 End stage renal disease: Secondary | ICD-10-CM | POA: Diagnosis not present

## 2019-10-01 DIAGNOSIS — Z992 Dependence on renal dialysis: Secondary | ICD-10-CM | POA: Diagnosis not present

## 2019-10-01 DIAGNOSIS — E1129 Type 2 diabetes mellitus with other diabetic kidney complication: Secondary | ICD-10-CM | POA: Diagnosis not present

## 2019-10-01 DIAGNOSIS — N186 End stage renal disease: Secondary | ICD-10-CM | POA: Diagnosis not present

## 2019-10-02 DIAGNOSIS — N2581 Secondary hyperparathyroidism of renal origin: Secondary | ICD-10-CM | POA: Diagnosis not present

## 2019-10-02 DIAGNOSIS — N186 End stage renal disease: Secondary | ICD-10-CM | POA: Diagnosis not present

## 2019-10-02 DIAGNOSIS — Z992 Dependence on renal dialysis: Secondary | ICD-10-CM | POA: Diagnosis not present

## 2019-10-04 DIAGNOSIS — Z992 Dependence on renal dialysis: Secondary | ICD-10-CM | POA: Diagnosis not present

## 2019-10-04 DIAGNOSIS — N186 End stage renal disease: Secondary | ICD-10-CM | POA: Diagnosis not present

## 2019-10-04 DIAGNOSIS — N2581 Secondary hyperparathyroidism of renal origin: Secondary | ICD-10-CM | POA: Diagnosis not present

## 2019-10-06 DIAGNOSIS — N186 End stage renal disease: Secondary | ICD-10-CM | POA: Diagnosis not present

## 2019-10-06 DIAGNOSIS — N2581 Secondary hyperparathyroidism of renal origin: Secondary | ICD-10-CM | POA: Diagnosis not present

## 2019-10-06 DIAGNOSIS — Z992 Dependence on renal dialysis: Secondary | ICD-10-CM | POA: Diagnosis not present

## 2019-10-09 DIAGNOSIS — N186 End stage renal disease: Secondary | ICD-10-CM | POA: Diagnosis not present

## 2019-10-09 DIAGNOSIS — Z992 Dependence on renal dialysis: Secondary | ICD-10-CM | POA: Diagnosis not present

## 2019-10-09 DIAGNOSIS — N2581 Secondary hyperparathyroidism of renal origin: Secondary | ICD-10-CM | POA: Diagnosis not present

## 2019-10-10 ENCOUNTER — Emergency Department (HOSPITAL_COMMUNITY): Payer: Medicare PPO

## 2019-10-10 ENCOUNTER — Other Ambulatory Visit: Payer: Self-pay

## 2019-10-10 ENCOUNTER — Emergency Department (HOSPITAL_COMMUNITY)
Admission: EM | Admit: 2019-10-10 | Discharge: 2019-10-11 | Disposition: A | Discharge status: Home / Self Care | Payer: Medicare PPO | Attending: Emergency Medicine | Admitting: Emergency Medicine

## 2019-10-10 DIAGNOSIS — Z7901 Long term (current) use of anticoagulants: Secondary | ICD-10-CM | POA: Insufficient documentation

## 2019-10-10 DIAGNOSIS — I509 Heart failure, unspecified: Secondary | ICD-10-CM | POA: Diagnosis not present

## 2019-10-10 DIAGNOSIS — N186 End stage renal disease: Secondary | ICD-10-CM | POA: Diagnosis not present

## 2019-10-10 DIAGNOSIS — Z7982 Long term (current) use of aspirin: Secondary | ICD-10-CM | POA: Diagnosis not present

## 2019-10-10 DIAGNOSIS — I2510 Atherosclerotic heart disease of native coronary artery without angina pectoris: Secondary | ICD-10-CM | POA: Insufficient documentation

## 2019-10-10 DIAGNOSIS — J189 Pneumonia, unspecified organism: Secondary | ICD-10-CM | POA: Diagnosis not present

## 2019-10-10 DIAGNOSIS — Z992 Dependence on renal dialysis: Secondary | ICD-10-CM | POA: Insufficient documentation

## 2019-10-10 DIAGNOSIS — M25519 Pain in unspecified shoulder: Secondary | ICD-10-CM | POA: Diagnosis not present

## 2019-10-10 DIAGNOSIS — I10 Essential (primary) hypertension: Secondary | ICD-10-CM | POA: Diagnosis not present

## 2019-10-10 DIAGNOSIS — M25512 Pain in left shoulder: Secondary | ICD-10-CM | POA: Diagnosis not present

## 2019-10-10 DIAGNOSIS — R52 Pain, unspecified: Secondary | ICD-10-CM | POA: Diagnosis not present

## 2019-10-10 DIAGNOSIS — Z79899 Other long term (current) drug therapy: Secondary | ICD-10-CM | POA: Insufficient documentation

## 2019-10-10 DIAGNOSIS — R509 Fever, unspecified: Secondary | ICD-10-CM | POA: Diagnosis not present

## 2019-10-10 DIAGNOSIS — I132 Hypertensive heart and chronic kidney disease with heart failure and with stage 5 chronic kidney disease, or end stage renal disease: Secondary | ICD-10-CM | POA: Insufficient documentation

## 2019-10-10 DIAGNOSIS — I120 Hypertensive chronic kidney disease with stage 5 chronic kidney disease or end stage renal disease: Secondary | ICD-10-CM | POA: Diagnosis not present

## 2019-10-10 LAB — TROPONIN I (HIGH SENSITIVITY)
Troponin I (High Sensitivity): 90 ng/L — ABNORMAL HIGH (ref <18)
Troponin I (High Sensitivity): 83 ng/L — ABNORMAL HIGH (ref <18)

## 2019-10-10 LAB — BASIC METABOLIC PANEL
Anion gap: 17 — ABNORMAL HIGH (ref 5–15)
BUN: 34 mg/dL — ABNORMAL HIGH (ref 8–23)
CO2: 29 mmol/L (ref 22–32)
Calcium: 9.3 mg/dL (ref 8.9–10.3)
Chloride: 92 mmol/L — ABNORMAL LOW (ref 98–111)
Creatinine, Ser: 7.98 mg/dL — ABNORMAL HIGH (ref 0.44–1.00)
GFR calc Af Amer: 5 mL/min — ABNORMAL LOW (ref >60)
GFR calc non Af Amer: 5 mL/min — ABNORMAL LOW (ref >60)
Glucose, Bld: 276 mg/dL — ABNORMAL HIGH (ref 70–99)
Potassium: 3.6 mmol/L (ref 3.5–5.1)
Sodium: 138 mmol/L (ref 135–145)

## 2019-10-10 LAB — CBC
HCT: 32.4 % — ABNORMAL LOW (ref 36.0–46.0)
Hemoglobin: 10.4 g/dL — ABNORMAL LOW (ref 12.0–15.0)
MCH: 31.8 pg (ref 26.0–34.0)
MCHC: 32.1 g/dL (ref 30.0–36.0)
MCV: 99.1 fL (ref 80.0–100.0)
Platelets: 236 10*3/uL (ref 150–400)
RBC: 3.27 MIL/uL — ABNORMAL LOW (ref 3.87–5.11)
RDW: 16.1 % — ABNORMAL HIGH (ref 11.5–15.5)
WBC: 13.2 10*3/uL — ABNORMAL HIGH (ref 4.0–10.5)
nRBC: 0 % (ref 0.0–0.2)

## 2019-10-10 MED ORDER — HYDROCODONE-ACETAMINOPHEN 5-325 MG PO TABS
2.0000 | ORAL_TABLET | Freq: Once | ORAL | Status: AC
  Administered 2019-10-10: 2 via ORAL
  Filled 2019-10-10: qty 2

## 2019-10-10 MED ORDER — ONDANSETRON 4 MG PO TBDP
4.0000 mg | ORAL_TABLET | Freq: Once | ORAL | Status: AC
  Administered 2019-10-10: 4 mg via ORAL
  Filled 2019-10-10: qty 1

## 2019-10-10 NOTE — ED Notes (Signed)
Sister [name] would like to be called when there is an update and dispo, aware pt is in waiting as of right now # [phone]

## 2019-10-10 NOTE — ED Triage Notes (Addendum)
Per EMS:  Pt here for left shoulder pain, radiates to her jaw that began this past Saturday.  Pt denies any injury or falls. Pt is dialysis pt, last treatment was yesterday.     EMS Vitals:  BP 170/78 HR 84 Sp02 98% RA  RR 18

## 2019-10-10 NOTE — ED Provider Notes (Addendum)
TIME SEEN: 11:13 PM  CHIEF COMPLAINT: Left shoulder pain  HPI: Patient is a 69 year old female with history of CAD, CHF, end-stage renal disease on dialysis who was last dialyzed yesterday who presents to the emergency department with left shoulder pain.  Pain worse with palpation of the shoulder and movement.  She denies chest pain, shortness of breath, fevers, cough, nausea, vomiting, diarrhea.  This does not feel like her anginal equivalent.  She is right-hand dominant.  Her fistula is in the left upper extremity.  They have been able to access it normally and she has not missed any recent dialysis.  No neck pain.  No numbness.  She has seen Dr. Sharol Given previously in 2018.  ROS: See HPI Constitutional: no fever  Eyes: no drainage  ENT: no runny nose   Cardiovascular:  no chest pain  Resp: no SOB  GI: no vomiting GU: no dysuria Integumentary: no rash  Allergy: no hives  Musculoskeletal: no leg swelling  Neurological: no slurred speech ROS otherwise negative  PAST MEDICAL HISTORY/PAST SURGICAL HISTORY:  Past Medical History:  Diagnosis Date  . Allergic rhinitis, cause unspecified 12/08/2010  . ANEMIA-IRON DEFICIENCY 01/25/2008  . Aortic regurgitation   . CAD (coronary artery disease)    a. 07/2016 NSTEMI/PCI: LM nl, LAD 70ost, 23m/d, 80d, D1 40ost, LCX 99ost/p (2.5x16 Synergy DES - 2.75), 40/69m, Om2 100 CTO, RCA  30p/m. Pt eval by CT surgery, not felt to be suitable candidate 2/2 comorbidities.  . CERVICAL RADICULOPATHY, LEFT 01/25/2008  . Chronic systolic CHF (congestive heart failure) ([place])    a. 07/2016 Echo: EF 40-45%, diff HK, sev AI, mild MVP w/ mod to sev MR, PASP 38mmHg.  . Critical lower limb ischemia   . DIABETES MELLITUS, UNCONTROLLED 05/21/2009  . Esophagitis   . ESRD on dialysis ([place])   . Foot ulcer ([place])    right lateral malleolus  . HCAP (healthcare-associated pneumonia) 06/29/2016  . HYPERLIPIDEMIA 03/30/2007  . HYPERTENSION 01/25/2008  . Ischemic cardiomyopathy     a. 07/2016 Echo: EF 40-45%.  . Mitral regurgitation    a. 07/2016 Echo: Mild MVP w/ mod to sev MR.  [name] PVD (peripheral vascular disease) ([place])    a. s/p bilat BKA  . SECONDARY HYPERPARATHYROIDISM 05/21/2009  . Severe aortic regurgitation    a. 07/2016 Echo: Severe AI.    MEDICATIONS:  Prior to Admission medications   Medication Sig Start Date End Date Taking? Authorizing Provider  acetaminophen (TYLENOL) 325 MG tablet Take 325 mg by mouth as needed.    [provider]  amLODipine (NORVASC) 5 MG tablet TAKE 1 TABLET BY MOUTH EVERY DAY 07/18/19   Almyra Deforest, PA  aspirin 81 MG EC tablet Take 81 mg by mouth daily.      [provider]  BYSTOLIC 5 MG tablet TAKE 1 TABLET BY MOUTH EVERY DAY Patient taking differently: Take 5 mg by mouth daily.  02/24/19   Lorretta Harp, MD  cinacalcet (SENSIPAR) 30 MG tablet Take 30 mg by mouth as directed. MWF on dialysis days 05/26/18   [provider]  clopidogrel (PLAVIX) 75 MG tablet Take 1 tablet (75 mg total) by mouth daily. 06/29/19   Lorretta Harp, MD  famotidine (PEPCID) 20 MG tablet TAKE 1 TABLET BY MOUTH EVERY DAY 06/15/19   Biagio Borg, MD  gabapentin (NEURONTIN) 100 MG capsule TAKE 2 CAPSULES (200 MG TOTAL) BY MOUTH 3 (THREE) TIMES DAILY. 04/12/19   Biagio Borg, MD  glipiZIDE (GLUCOTROL XL)  10 MG 24 hr tablet Take 1 tablet (10 mg total) by mouth daily with breakfast. 04/24/19   Biagio Borg, MD  glucose blood (ONE TOUCH ULTRA TEST) test strip Use as instructed, twice daily code E11.52 04/06/17   Biagio Borg, MD  JANUVIA 100 MG tablet TAKE 1 TABLET BY MOUTH EVERY DAY 06/29/19   Biagio Borg, MD  lidocaine-prilocaine (EMLA) cream APPLY A SMALL AMOUNT TO SKIN 3 TIMES A WEEK OVER DIALYSIS SHUNT 30 MINUTES PRIOR TO DIALYSIS Patient taking differently: Apply 1 application topically as needed. APPLY A SMALL AMOUNT TO SKIN 3 TIMES A WEEK OVER DIALYSIS SHUNT 30 MINUTES PRIOR TO DIALYSIS 04/06/17   Biagio Borg, MD   multivitamin (RENA-VIT) TABS tablet TAKE 1 TABLET BY MOUTH EVERYDAY AT BEDTIME 09/17/19   Biagio Borg, MD  [facility] VERIO test strip USE TO CHECK BLOOD SUGARS TWICE A DAY DX E11.9 12/30/18   Biagio Borg, MD  pantoprazole (PROTONIX) 40 MG tablet TAKE 1 TABLET BY MOUTH TWICE A DAY 05/29/19   Biagio Borg, MD  VELPHORO 500 MG chewable tablet Chew 1,000 mg by mouth 3 (three) times daily. 12/23/18   [provider]    ALLERGIES:  No Known Allergies  SOCIAL HISTORY:  Social History   Tobacco Use  . Smoking status: Never Smoker  . Smokeless tobacco: Never Used  Substance Use Topics  . Alcohol use: No    Alcohol/week: 0.0 standard drinks    FAMILY HISTORY: Family History  Problem Relation Age of Onset  . Cancer Mother        Pancreatic    EXAM: BP (!) 156/54 (BP Location: Right Arm)   Pulse 61   Temp 99.4 F (37.4 C) (Oral)   Resp 18   SpO2 98%  CONSTITUTIONAL: Alert and oriented and responds appropriately to questions.  Appears uncomfortable but not in distress.  Well-nourished, nontoxic. HEAD: Normocephalic EYES: Conjunctivae clear, pupils appear equal, EOM appear intact ENT: normal nose; moist mucous membranes NECK: Supple, normal ROM, no midline cervical spine tenderness or step-off or deformity CARD: RRR; S1 and S2 appreciated; no murmurs, no clicks, no rubs, no gallops RESP: Normal chest excursion without splinting or tachypnea; breath sounds clear and equal bilaterally; no wheezes, no rhonchi, no rales, no hypoxia or respiratory distress, speaking full sentences ABD/GI: Normal bowel sounds; non-distended; soft, non-tender, no rebound, no guarding, no peritoneal signs, no hepatosplenomegaly BACK:  The back appears normal EXT: Patient is tender to palpation over the anterior and lateral left shoulder without loss of fullness, associated effusion, redness, warmth, ecchymosis or soft tissue swelling.  Compartments in the left arm are soft.  She is a 2+ left radial  pulse and normal sensation throughout the left arm.  Normal capillary refill in the fingers.  Her AV fistula is in the left upper extremity and has normal thrill and is nontender to palpation without redness, warmth, ecchymosis, induration, bleeding or drainage.  She has decreased range of motion of the left shoulder secondary to pain.  Normal range of motion of the left fingers, wrist and elbow.  Patient is status post bilateral BKA.  Prosthesis in place bilaterally. SKIN: Normal color for age and race; warm; no rash on exposed skin NEURO: Moves all extremities equally PSYCH: The patient's mood and manner are appropriate.   MEDICAL DECISION MAKING: Patient here with left shoulder pain.  Seems to be musculoskeletal in nature.  I do not think this is her anginal equivalent.  Cardiac  work-up was obtained in triage.  She does have slightly elevated high-sensitivity troponins but this is in the setting of chronic kidney disease.  First troponin was 90 and second troponin going down slightly to 83.  EKG shows no ischemic abnormality.  Given these troponins are flat and I am not a specialist that this is cardiac in nature, I do not feel she needs admission for this.  We have no previous high-sensitivity troponins to compare to.  X-ray of the left shoulder shows degenerative changes but no fracture, dislocation.  No sign of septic arthritis, compartment syndrome, DVT, arterial obstruction.  AV fistula appears normal with good thrill and distal pulses.  No signs of superimposed infection.  Will treat with Vicodin for pain control and provide with sling for comfort.  Recommended rest, ice, pain medication and if not improving with medical therapy, outpatient orthopedic follow-up.  ED PROGRESS: Patient did have mildly elevated oral temperature but rectal temperature shows no fever.  She denies having any fevers, chills, body aches, sore throat, cough, vomiting, diarrhea, abdominal pain.  States only make small amount  of urine every few days.  No Covid exposures.  Chest x-ray is clear.  Rectal temperature 99.1.  Pain improved after Vicodin.  Currently in a sling.  Recommended orthopedic follow-up.  Discussed return precautions.  I feel she is safe for discharge.  Attempted to contact her Sister [name] by phone at [phone] without success.  HIPPA compliant VM left.   At this time, I do not feel there is any life-threatening condition present. I have reviewed, interpreted and discussed all results (EKG, imaging, lab, urine as appropriate) and exam findings with patient/family. I have reviewed nursing notes and appropriate previous records.  I feel the patient is safe to be discharged home without further emergent workup and can continue workup as an outpatient as needed. Discussed usual and customary return precautions. Patient/family verbalize understanding and are comfortable with this plan.  Outpatient follow-up has been provided as needed. All questions have been answered.     EKG Interpretation  Date/Time:  Tuesday October 10 2019 18:34:43 EST Ventricular Rate:  73 PR Interval:  190 QRS Duration: 74 QT Interval:  398 QTC Calculation: 438 R Axis:   38 Text Interpretation: Normal sinus rhythm Normal ECG No significant change since last tracing Reconfirmed by Pryor Curia [phone]) on 10/10/2019 11:17:46 PM       .   [name] was evaluated in Emergency Department on 10/10/2019 for the symptoms described in the history of present illness. She was evaluated in the context of the global COVID-19 pandemic, which necessitated consideration that the patient might be at risk for infection with the SARS-CoV-2 virus that causes COVID-19. Institutional protocols and algorithms that pertain to the evaluation of patients at risk for COVID-19 are in a state of rapid change based on information released by regulatory bodies including the CDC and federal and state organizations. These policies and  algorithms were followed during the patient's care in the ED.  Patient was seen wearing N95, face shield, gloves.        Kristene Liberati, Delice Bison, DO 10/11/19 [phone]

## 2019-10-11 MED ORDER — ONDANSETRON 4 MG PO TBDP: 4.0000 mg | ORAL_TABLET | Freq: Four times a day (QID) | ORAL | 0 refills | Status: DC | PRN

## 2019-10-11 MED ORDER — HYDROCODONE-ACETAMINOPHEN 5-325 MG PO TABS: 1.0000 | ORAL_TABLET | ORAL | 0 refills | Status: DC | PRN

## 2019-10-11 NOTE — Discharge Instructions (Signed)
The x-ray of your shoulder showed degenerative changes but no fracture or dislocation.  Your labs, EKG and chest x-ray showed no new acute abnormality.  I do not think your left shoulder pain is coming from your heart.  I recommend follow-up with orthopedics if shoulder pain is not improving with rest, ice and pain medication.  We have provided you with a sling for comfort.  I recommend that you take your arm out of the sling several times a day to move it around to avoid a condition called frozen shoulder or adhesive capsulitis.   You are being provided a prescription for opiates (also known as narcotics) for pain control.  Opiates can be addictive and should only be used when absolutely necessary for pain control when other alternatives do not work.  We recommend you only use them for the recommended amount of time and only as prescribed.  Please do not take with other sedative medications or alcohol.  Please do not drive, operate machinery, make important decisions while taking opiates.  Please note that these medications can be addictive and have high abuse potential.  Patients can become addicted to narcotics after only taking them for a few days.  Please keep these medications locked away from children, teenagers or any family members with history of substance abuse.  Narcotic pain medicine may also make you constipated.  You may use over-the-counter medications such as MiraLAX, Colace to prevent constipation.  If you become constipated you may use over-the-counter enemas as needed.  Itching and nausea are common side effects of narcotic pain medication.  If you develop uncontrolled vomiting or a rash, please stop these medications.

## 2019-10-13 DIAGNOSIS — N186 End stage renal disease: Secondary | ICD-10-CM | POA: Diagnosis not present

## 2019-10-13 DIAGNOSIS — N2581 Secondary hyperparathyroidism of renal origin: Secondary | ICD-10-CM | POA: Diagnosis not present

## 2019-10-13 DIAGNOSIS — Z992 Dependence on renal dialysis: Secondary | ICD-10-CM | POA: Diagnosis not present

## 2019-10-16 DIAGNOSIS — Z992 Dependence on renal dialysis: Secondary | ICD-10-CM | POA: Diagnosis not present

## 2019-10-16 DIAGNOSIS — N2581 Secondary hyperparathyroidism of renal origin: Secondary | ICD-10-CM | POA: Diagnosis not present

## 2019-10-16 DIAGNOSIS — N186 End stage renal disease: Secondary | ICD-10-CM | POA: Diagnosis not present

## 2019-10-17 ENCOUNTER — Encounter: Payer: Self-pay | Admitting: Internal Medicine

## 2019-10-18 DIAGNOSIS — Z992 Dependence on renal dialysis: Secondary | ICD-10-CM | POA: Diagnosis not present

## 2019-10-18 DIAGNOSIS — N2581 Secondary hyperparathyroidism of renal origin: Secondary | ICD-10-CM | POA: Diagnosis not present

## 2019-10-18 DIAGNOSIS — N186 End stage renal disease: Secondary | ICD-10-CM | POA: Diagnosis not present

## 2019-10-20 DIAGNOSIS — N2581 Secondary hyperparathyroidism of renal origin: Secondary | ICD-10-CM | POA: Diagnosis not present

## 2019-10-20 DIAGNOSIS — Z992 Dependence on renal dialysis: Secondary | ICD-10-CM | POA: Diagnosis not present

## 2019-10-20 DIAGNOSIS — N186 End stage renal disease: Secondary | ICD-10-CM | POA: Diagnosis not present

## 2019-10-23 DIAGNOSIS — N186 End stage renal disease: Secondary | ICD-10-CM | POA: Diagnosis not present

## 2019-10-23 DIAGNOSIS — N2581 Secondary hyperparathyroidism of renal origin: Secondary | ICD-10-CM | POA: Diagnosis not present

## 2019-10-23 DIAGNOSIS — Z992 Dependence on renal dialysis: Secondary | ICD-10-CM | POA: Diagnosis not present

## 2019-10-25 DIAGNOSIS — N186 End stage renal disease: Secondary | ICD-10-CM | POA: Diagnosis not present

## 2019-10-25 DIAGNOSIS — Z992 Dependence on renal dialysis: Secondary | ICD-10-CM | POA: Diagnosis not present

## 2019-10-25 DIAGNOSIS — N2581 Secondary hyperparathyroidism of renal origin: Secondary | ICD-10-CM | POA: Diagnosis not present

## 2019-10-27 DIAGNOSIS — Z992 Dependence on renal dialysis: Secondary | ICD-10-CM | POA: Diagnosis not present

## 2019-10-27 DIAGNOSIS — N2581 Secondary hyperparathyroidism of renal origin: Secondary | ICD-10-CM | POA: Diagnosis not present

## 2019-10-27 DIAGNOSIS — N186 End stage renal disease: Secondary | ICD-10-CM | POA: Diagnosis not present

## 2019-10-29 DIAGNOSIS — Z992 Dependence on renal dialysis: Secondary | ICD-10-CM | POA: Diagnosis not present

## 2019-10-29 DIAGNOSIS — N186 End stage renal disease: Secondary | ICD-10-CM | POA: Diagnosis not present

## 2019-10-29 DIAGNOSIS — E1129 Type 2 diabetes mellitus with other diabetic kidney complication: Secondary | ICD-10-CM | POA: Diagnosis not present

## 2019-10-30 ENCOUNTER — Ambulatory Visit: Payer: Medicare PPO | Attending: Internal Medicine

## 2019-10-30 DIAGNOSIS — Z23 Encounter for immunization: Secondary | ICD-10-CM

## 2019-10-30 DIAGNOSIS — Z992 Dependence on renal dialysis: Secondary | ICD-10-CM | POA: Diagnosis not present

## 2019-10-30 DIAGNOSIS — N2581 Secondary hyperparathyroidism of renal origin: Secondary | ICD-10-CM | POA: Diagnosis not present

## 2019-10-30 DIAGNOSIS — N186 End stage renal disease: Secondary | ICD-10-CM | POA: Diagnosis not present

## 2019-10-30 NOTE — Progress Notes (Signed)
   Covid-19 Vaccination Clinic  Name:  KIERSYN DRUKER    MRN: [id number] DOB: 06-11-1951  10/30/2019  Ms. Donnally was observed post Covid-19 immunization for 15 minutes without incidence. She was provided with Vaccine Information Sheet and instruction to access the V-Safe system.   Ms. Pata was instructed to call 911 with any severe reactions post vaccine: [name] Difficulty breathing  . Swelling of your face and throat  . A fast heartbeat  . A bad rash all over your body  . Dizziness and weakness    Immunizations Administered    Name Date Dose VIS Date Route   Pfizer COVID-19 Vaccine 10/30/2019  3:16 PM 0.3 mL 08/11/2019 Intramuscular   Manufacturer: [place]   Lot: [id number]   [place]: [id number]

## 2019-11-01 DIAGNOSIS — N186 End stage renal disease: Secondary | ICD-10-CM | POA: Diagnosis not present

## 2019-11-01 DIAGNOSIS — Z992 Dependence on renal dialysis: Secondary | ICD-10-CM | POA: Diagnosis not present

## 2019-11-01 DIAGNOSIS — N2581 Secondary hyperparathyroidism of renal origin: Secondary | ICD-10-CM | POA: Diagnosis not present

## 2019-11-02 ENCOUNTER — Ambulatory Visit: Payer: Medicare PPO | Attending: Internal Medicine

## 2019-11-03 DIAGNOSIS — Z992 Dependence on renal dialysis: Secondary | ICD-10-CM | POA: Diagnosis not present

## 2019-11-03 DIAGNOSIS — N186 End stage renal disease: Secondary | ICD-10-CM | POA: Diagnosis not present

## 2019-11-03 DIAGNOSIS — N2581 Secondary hyperparathyroidism of renal origin: Secondary | ICD-10-CM | POA: Diagnosis not present

## 2019-11-06 DIAGNOSIS — N186 End stage renal disease: Secondary | ICD-10-CM | POA: Diagnosis not present

## 2019-11-06 DIAGNOSIS — Z992 Dependence on renal dialysis: Secondary | ICD-10-CM | POA: Diagnosis not present

## 2019-11-06 DIAGNOSIS — N2581 Secondary hyperparathyroidism of renal origin: Secondary | ICD-10-CM | POA: Diagnosis not present

## 2019-11-08 DIAGNOSIS — N2581 Secondary hyperparathyroidism of renal origin: Secondary | ICD-10-CM | POA: Diagnosis not present

## 2019-11-08 DIAGNOSIS — N186 End stage renal disease: Secondary | ICD-10-CM | POA: Diagnosis not present

## 2019-11-08 DIAGNOSIS — Z992 Dependence on renal dialysis: Secondary | ICD-10-CM | POA: Diagnosis not present

## 2019-11-10 DIAGNOSIS — Z992 Dependence on renal dialysis: Secondary | ICD-10-CM | POA: Diagnosis not present

## 2019-11-10 DIAGNOSIS — N2581 Secondary hyperparathyroidism of renal origin: Secondary | ICD-10-CM | POA: Diagnosis not present

## 2019-11-10 DIAGNOSIS — N186 End stage renal disease: Secondary | ICD-10-CM | POA: Diagnosis not present

## 2019-11-13 DIAGNOSIS — N186 End stage renal disease: Secondary | ICD-10-CM | POA: Diagnosis not present

## 2019-11-13 DIAGNOSIS — Z992 Dependence on renal dialysis: Secondary | ICD-10-CM | POA: Diagnosis not present

## 2019-11-13 DIAGNOSIS — N2581 Secondary hyperparathyroidism of renal origin: Secondary | ICD-10-CM | POA: Diagnosis not present

## 2019-11-15 DIAGNOSIS — N2581 Secondary hyperparathyroidism of renal origin: Secondary | ICD-10-CM | POA: Diagnosis not present

## 2019-11-15 DIAGNOSIS — N186 End stage renal disease: Secondary | ICD-10-CM | POA: Diagnosis not present

## 2019-11-15 DIAGNOSIS — Z992 Dependence on renal dialysis: Secondary | ICD-10-CM | POA: Diagnosis not present

## 2019-11-17 DIAGNOSIS — Z992 Dependence on renal dialysis: Secondary | ICD-10-CM | POA: Diagnosis not present

## 2019-11-17 DIAGNOSIS — N186 End stage renal disease: Secondary | ICD-10-CM | POA: Diagnosis not present

## 2019-11-17 DIAGNOSIS — N2581 Secondary hyperparathyroidism of renal origin: Secondary | ICD-10-CM | POA: Diagnosis not present

## 2019-11-20 DIAGNOSIS — N186 End stage renal disease: Secondary | ICD-10-CM | POA: Diagnosis not present

## 2019-11-20 DIAGNOSIS — Z992 Dependence on renal dialysis: Secondary | ICD-10-CM | POA: Diagnosis not present

## 2019-11-20 DIAGNOSIS — N2581 Secondary hyperparathyroidism of renal origin: Secondary | ICD-10-CM | POA: Diagnosis not present

## 2019-11-22 ENCOUNTER — Ambulatory Visit: Payer: Medicare PPO | Attending: Internal Medicine

## 2019-11-22 DIAGNOSIS — N186 End stage renal disease: Secondary | ICD-10-CM | POA: Diagnosis not present

## 2019-11-22 DIAGNOSIS — Z23 Encounter for immunization: Secondary | ICD-10-CM

## 2019-11-22 DIAGNOSIS — N2581 Secondary hyperparathyroidism of renal origin: Secondary | ICD-10-CM | POA: Diagnosis not present

## 2019-11-22 DIAGNOSIS — Z992 Dependence on renal dialysis: Secondary | ICD-10-CM | POA: Diagnosis not present

## 2019-11-22 NOTE — Progress Notes (Signed)
   Covid-19 Vaccination Clinic  Name:  Carolyn Valenzuela    MRN: [id number] DOB: 05/14/1951  11/22/2019  [name] was observed post Covid-19 immunization for 15 minutes without incident. She was provided with Vaccine Information Sheet and instruction to access the V-Safe system.   [name] was instructed to call 911 with any severe reactions post vaccine: [name] Difficulty breathing  . Swelling of face and throat  . A fast heartbeat  . A bad rash all over body  . Dizziness and weakness   Immunizations Administered    Name Date Dose VIS Date Route   Pfizer COVID-19 Vaccine 11/22/2019  3:00 PM 0.3 mL 08/11/2019 Intramuscular   Manufacturer: [place]   Lot: [id number]   [place]: [address]-1

## 2019-11-24 DIAGNOSIS — N186 End stage renal disease: Secondary | ICD-10-CM | POA: Diagnosis not present

## 2019-11-24 DIAGNOSIS — N2581 Secondary hyperparathyroidism of renal origin: Secondary | ICD-10-CM | POA: Diagnosis not present

## 2019-11-24 DIAGNOSIS — Z992 Dependence on renal dialysis: Secondary | ICD-10-CM | POA: Diagnosis not present

## 2019-11-27 DIAGNOSIS — N2581 Secondary hyperparathyroidism of renal origin: Secondary | ICD-10-CM | POA: Diagnosis not present

## 2019-11-27 DIAGNOSIS — N186 End stage renal disease: Secondary | ICD-10-CM | POA: Diagnosis not present

## 2019-11-27 DIAGNOSIS — Z992 Dependence on renal dialysis: Secondary | ICD-10-CM | POA: Diagnosis not present

## 2019-11-29 DIAGNOSIS — E1129 Type 2 diabetes mellitus with other diabetic kidney complication: Secondary | ICD-10-CM | POA: Diagnosis not present

## 2019-11-29 DIAGNOSIS — N2581 Secondary hyperparathyroidism of renal origin: Secondary | ICD-10-CM | POA: Diagnosis not present

## 2019-11-29 DIAGNOSIS — Z992 Dependence on renal dialysis: Secondary | ICD-10-CM | POA: Diagnosis not present

## 2019-11-29 DIAGNOSIS — N186 End stage renal disease: Secondary | ICD-10-CM | POA: Diagnosis not present

## 2019-12-01 DIAGNOSIS — Z992 Dependence on renal dialysis: Secondary | ICD-10-CM | POA: Diagnosis not present

## 2019-12-01 DIAGNOSIS — N2581 Secondary hyperparathyroidism of renal origin: Secondary | ICD-10-CM | POA: Diagnosis not present

## 2019-12-01 DIAGNOSIS — N186 End stage renal disease: Secondary | ICD-10-CM | POA: Diagnosis not present

## 2019-12-04 DIAGNOSIS — N2581 Secondary hyperparathyroidism of renal origin: Secondary | ICD-10-CM | POA: Diagnosis not present

## 2019-12-04 DIAGNOSIS — N186 End stage renal disease: Secondary | ICD-10-CM | POA: Diagnosis not present

## 2019-12-04 DIAGNOSIS — Z992 Dependence on renal dialysis: Secondary | ICD-10-CM | POA: Diagnosis not present

## 2019-12-06 DIAGNOSIS — Z992 Dependence on renal dialysis: Secondary | ICD-10-CM | POA: Diagnosis not present

## 2019-12-06 DIAGNOSIS — N186 End stage renal disease: Secondary | ICD-10-CM | POA: Diagnosis not present

## 2019-12-06 DIAGNOSIS — N2581 Secondary hyperparathyroidism of renal origin: Secondary | ICD-10-CM | POA: Diagnosis not present

## 2019-12-08 DIAGNOSIS — N2581 Secondary hyperparathyroidism of renal origin: Secondary | ICD-10-CM | POA: Diagnosis not present

## 2019-12-08 DIAGNOSIS — N186 End stage renal disease: Secondary | ICD-10-CM | POA: Diagnosis not present

## 2019-12-08 DIAGNOSIS — Z992 Dependence on renal dialysis: Secondary | ICD-10-CM | POA: Diagnosis not present

## 2019-12-11 DIAGNOSIS — N2581 Secondary hyperparathyroidism of renal origin: Secondary | ICD-10-CM | POA: Diagnosis not present

## 2019-12-11 DIAGNOSIS — Z992 Dependence on renal dialysis: Secondary | ICD-10-CM | POA: Diagnosis not present

## 2019-12-11 DIAGNOSIS — N186 End stage renal disease: Secondary | ICD-10-CM | POA: Diagnosis not present

## 2019-12-13 DIAGNOSIS — Z992 Dependence on renal dialysis: Secondary | ICD-10-CM | POA: Diagnosis not present

## 2019-12-13 DIAGNOSIS — N186 End stage renal disease: Secondary | ICD-10-CM | POA: Diagnosis not present

## 2019-12-13 DIAGNOSIS — N2581 Secondary hyperparathyroidism of renal origin: Secondary | ICD-10-CM | POA: Diagnosis not present

## 2019-12-15 DIAGNOSIS — Z992 Dependence on renal dialysis: Secondary | ICD-10-CM | POA: Diagnosis not present

## 2019-12-15 DIAGNOSIS — N186 End stage renal disease: Secondary | ICD-10-CM | POA: Diagnosis not present

## 2019-12-15 DIAGNOSIS — N2581 Secondary hyperparathyroidism of renal origin: Secondary | ICD-10-CM | POA: Diagnosis not present

## 2019-12-18 DIAGNOSIS — N2581 Secondary hyperparathyroidism of renal origin: Secondary | ICD-10-CM | POA: Diagnosis not present

## 2019-12-18 DIAGNOSIS — Z992 Dependence on renal dialysis: Secondary | ICD-10-CM | POA: Diagnosis not present

## 2019-12-18 DIAGNOSIS — N186 End stage renal disease: Secondary | ICD-10-CM | POA: Diagnosis not present

## 2019-12-19 ENCOUNTER — Other Ambulatory Visit: Payer: Self-pay | Admitting: Cardiovascular Disease

## 2019-12-20 DIAGNOSIS — Z992 Dependence on renal dialysis: Secondary | ICD-10-CM | POA: Diagnosis not present

## 2019-12-20 DIAGNOSIS — N2581 Secondary hyperparathyroidism of renal origin: Secondary | ICD-10-CM | POA: Diagnosis not present

## 2019-12-20 DIAGNOSIS — N186 End stage renal disease: Secondary | ICD-10-CM | POA: Diagnosis not present

## 2019-12-21 ENCOUNTER — Other Ambulatory Visit: Payer: Self-pay | Admitting: Internal Medicine

## 2019-12-21 NOTE — Telephone Encounter (Signed)
Please refill as per office routine med refill policy (all routine meds refilled for 3 mo or monthly per pt preference up to one year from last visit, then month to month grace period for 3 mo, then further med refills will have to be denied)  

## 2019-12-22 DIAGNOSIS — N2581 Secondary hyperparathyroidism of renal origin: Secondary | ICD-10-CM | POA: Diagnosis not present

## 2019-12-22 DIAGNOSIS — N186 End stage renal disease: Secondary | ICD-10-CM | POA: Diagnosis not present

## 2019-12-22 DIAGNOSIS — Z992 Dependence on renal dialysis: Secondary | ICD-10-CM | POA: Diagnosis not present

## 2019-12-25 DIAGNOSIS — N2581 Secondary hyperparathyroidism of renal origin: Secondary | ICD-10-CM | POA: Diagnosis not present

## 2019-12-25 DIAGNOSIS — N186 End stage renal disease: Secondary | ICD-10-CM | POA: Diagnosis not present

## 2019-12-25 DIAGNOSIS — Z992 Dependence on renal dialysis: Secondary | ICD-10-CM | POA: Diagnosis not present

## 2019-12-26 ENCOUNTER — Other Ambulatory Visit: Payer: Self-pay | Admitting: Internal Medicine

## 2019-12-26 DIAGNOSIS — N186 End stage renal disease: Secondary | ICD-10-CM | POA: Diagnosis not present

## 2019-12-26 DIAGNOSIS — T82858A Stenosis of vascular prosthetic devices, implants and grafts, initial encounter: Secondary | ICD-10-CM | POA: Diagnosis not present

## 2019-12-26 DIAGNOSIS — I871 Compression of vein: Secondary | ICD-10-CM | POA: Diagnosis not present

## 2019-12-26 DIAGNOSIS — Z992 Dependence on renal dialysis: Secondary | ICD-10-CM | POA: Diagnosis not present

## 2019-12-27 ENCOUNTER — Other Ambulatory Visit: Payer: Self-pay | Admitting: Internal Medicine

## 2019-12-27 DIAGNOSIS — N2581 Secondary hyperparathyroidism of renal origin: Secondary | ICD-10-CM | POA: Diagnosis not present

## 2019-12-27 DIAGNOSIS — Z992 Dependence on renal dialysis: Secondary | ICD-10-CM | POA: Diagnosis not present

## 2019-12-27 DIAGNOSIS — N186 End stage renal disease: Secondary | ICD-10-CM | POA: Diagnosis not present

## 2019-12-29 DIAGNOSIS — N186 End stage renal disease: Secondary | ICD-10-CM | POA: Diagnosis not present

## 2019-12-29 DIAGNOSIS — N2581 Secondary hyperparathyroidism of renal origin: Secondary | ICD-10-CM | POA: Diagnosis not present

## 2019-12-29 DIAGNOSIS — E1129 Type 2 diabetes mellitus with other diabetic kidney complication: Secondary | ICD-10-CM | POA: Diagnosis not present

## 2019-12-29 DIAGNOSIS — Z992 Dependence on renal dialysis: Secondary | ICD-10-CM | POA: Diagnosis not present

## 2020-01-01 DIAGNOSIS — N2581 Secondary hyperparathyroidism of renal origin: Secondary | ICD-10-CM | POA: Diagnosis not present

## 2020-01-01 DIAGNOSIS — Z992 Dependence on renal dialysis: Secondary | ICD-10-CM | POA: Diagnosis not present

## 2020-01-01 DIAGNOSIS — N186 End stage renal disease: Secondary | ICD-10-CM | POA: Diagnosis not present

## 2020-01-02 DIAGNOSIS — H40013 Open angle with borderline findings, low risk, bilateral: Secondary | ICD-10-CM | POA: Diagnosis not present

## 2020-01-02 DIAGNOSIS — E11319 Type 2 diabetes mellitus with unspecified diabetic retinopathy without macular edema: Secondary | ICD-10-CM | POA: Diagnosis not present

## 2020-01-02 DIAGNOSIS — H5203 Hypermetropia, bilateral: Secondary | ICD-10-CM | POA: Diagnosis not present

## 2020-01-02 DIAGNOSIS — H16223 Keratoconjunctivitis sicca, not specified as Sjogren's, bilateral: Secondary | ICD-10-CM | POA: Diagnosis not present

## 2020-01-02 DIAGNOSIS — Z794 Long term (current) use of insulin: Secondary | ICD-10-CM | POA: Diagnosis not present

## 2020-01-02 DIAGNOSIS — Z961 Presence of intraocular lens: Secondary | ICD-10-CM | POA: Diagnosis not present

## 2020-01-02 DIAGNOSIS — H3589 Other specified retinal disorders: Secondary | ICD-10-CM | POA: Diagnosis not present

## 2020-01-02 DIAGNOSIS — H11153 Pinguecula, bilateral: Secondary | ICD-10-CM | POA: Diagnosis not present

## 2020-01-02 DIAGNOSIS — H18413 Arcus senilis, bilateral: Secondary | ICD-10-CM | POA: Diagnosis not present

## 2020-01-03 ENCOUNTER — Other Ambulatory Visit: Payer: Self-pay | Admitting: Internal Medicine

## 2020-01-03 DIAGNOSIS — N2581 Secondary hyperparathyroidism of renal origin: Secondary | ICD-10-CM | POA: Diagnosis not present

## 2020-01-03 DIAGNOSIS — N186 End stage renal disease: Secondary | ICD-10-CM | POA: Diagnosis not present

## 2020-01-03 DIAGNOSIS — Z992 Dependence on renal dialysis: Secondary | ICD-10-CM | POA: Diagnosis not present

## 2020-01-03 NOTE — Telephone Encounter (Signed)
Done erx 

## 2020-01-05 DIAGNOSIS — Z992 Dependence on renal dialysis: Secondary | ICD-10-CM | POA: Diagnosis not present

## 2020-01-05 DIAGNOSIS — N186 End stage renal disease: Secondary | ICD-10-CM | POA: Diagnosis not present

## 2020-01-05 DIAGNOSIS — N2581 Secondary hyperparathyroidism of renal origin: Secondary | ICD-10-CM | POA: Diagnosis not present

## 2020-01-08 DIAGNOSIS — Z992 Dependence on renal dialysis: Secondary | ICD-10-CM | POA: Diagnosis not present

## 2020-01-08 DIAGNOSIS — N186 End stage renal disease: Secondary | ICD-10-CM | POA: Diagnosis not present

## 2020-01-08 DIAGNOSIS — N2581 Secondary hyperparathyroidism of renal origin: Secondary | ICD-10-CM | POA: Diagnosis not present

## 2020-01-09 ENCOUNTER — Other Ambulatory Visit: Payer: Self-pay

## 2020-01-09 ENCOUNTER — Encounter (INDEPENDENT_AMBULATORY_CARE_PROVIDER_SITE_OTHER): Payer: Medicare PPO | Admitting: Ophthalmology

## 2020-01-09 DIAGNOSIS — E113511 Type 2 diabetes mellitus with proliferative diabetic retinopathy with macular edema, right eye: Secondary | ICD-10-CM | POA: Diagnosis not present

## 2020-01-09 DIAGNOSIS — H35033 Hypertensive retinopathy, bilateral: Secondary | ICD-10-CM | POA: Diagnosis not present

## 2020-01-09 DIAGNOSIS — I10 Essential (primary) hypertension: Secondary | ICD-10-CM

## 2020-01-09 DIAGNOSIS — E113592 Type 2 diabetes mellitus with proliferative diabetic retinopathy without macular edema, left eye: Secondary | ICD-10-CM

## 2020-01-09 DIAGNOSIS — H43813 Vitreous degeneration, bilateral: Secondary | ICD-10-CM | POA: Diagnosis not present

## 2020-01-09 DIAGNOSIS — E11311 Type 2 diabetes mellitus with unspecified diabetic retinopathy with macular edema: Secondary | ICD-10-CM

## 2020-01-10 DIAGNOSIS — Z992 Dependence on renal dialysis: Secondary | ICD-10-CM | POA: Diagnosis not present

## 2020-01-10 DIAGNOSIS — N2581 Secondary hyperparathyroidism of renal origin: Secondary | ICD-10-CM | POA: Diagnosis not present

## 2020-01-10 DIAGNOSIS — N186 End stage renal disease: Secondary | ICD-10-CM | POA: Diagnosis not present

## 2020-01-12 DIAGNOSIS — N186 End stage renal disease: Secondary | ICD-10-CM | POA: Diagnosis not present

## 2020-01-12 DIAGNOSIS — Z992 Dependence on renal dialysis: Secondary | ICD-10-CM | POA: Diagnosis not present

## 2020-01-12 DIAGNOSIS — N2581 Secondary hyperparathyroidism of renal origin: Secondary | ICD-10-CM | POA: Diagnosis not present

## 2020-01-15 ENCOUNTER — Other Ambulatory Visit: Payer: Self-pay | Admitting: Internal Medicine

## 2020-01-15 DIAGNOSIS — Z992 Dependence on renal dialysis: Secondary | ICD-10-CM | POA: Diagnosis not present

## 2020-01-15 DIAGNOSIS — N186 End stage renal disease: Secondary | ICD-10-CM | POA: Diagnosis not present

## 2020-01-15 DIAGNOSIS — N2581 Secondary hyperparathyroidism of renal origin: Secondary | ICD-10-CM | POA: Diagnosis not present

## 2020-01-16 ENCOUNTER — Telehealth: Payer: Self-pay

## 2020-01-16 ENCOUNTER — Other Ambulatory Visit: Payer: Self-pay | Admitting: Cardiovascular Disease

## 2020-01-16 ENCOUNTER — Other Ambulatory Visit: Payer: Self-pay | Admitting: Internal Medicine

## 2020-01-16 MED ORDER — GLUCOSE BLOOD VI STRP: ORAL_STRIP | 12 refills | Status: DC

## 2020-01-16 NOTE — Telephone Encounter (Signed)
Please refill as per office routine med refill policy (all routine meds refilled for 3 mo or monthly per pt preference up to one year from last visit, then month to month grace period for 3 mo, then further med refills will have to be denied)  

## 2020-01-16 NOTE — Telephone Encounter (Signed)
   Patient calling, states pharmacy is requesting prior auth for test strips

## 2020-01-16 NOTE — Telephone Encounter (Signed)
Reviewed chart pt is up-to-date sent refills to [facility].../lmb  

## 2020-01-16 NOTE — Telephone Encounter (Signed)
1.Medication Requested:ONETOUCH VERIO test strip  glucose blood (ONE TOUCH ULTRA TEST) test strip  2. Pharmacy (Name, Street, City):[id number] - [place], [place] - [address]  3. On Med List: Yes   4. Last Visit with PCP: 1.7.2021   5. Next visit date with PCP: 7.13.2021    Agent: Please be advised that RX refills may take up to 3 business days. We ask that you follow-up with your pharmacy.

## 2020-01-17 DIAGNOSIS — N186 End stage renal disease: Secondary | ICD-10-CM | POA: Diagnosis not present

## 2020-01-17 DIAGNOSIS — Z992 Dependence on renal dialysis: Secondary | ICD-10-CM | POA: Diagnosis not present

## 2020-01-17 DIAGNOSIS — N2581 Secondary hyperparathyroidism of renal origin: Secondary | ICD-10-CM | POA: Diagnosis not present

## 2020-01-19 DIAGNOSIS — N186 End stage renal disease: Secondary | ICD-10-CM | POA: Diagnosis not present

## 2020-01-19 DIAGNOSIS — N2581 Secondary hyperparathyroidism of renal origin: Secondary | ICD-10-CM | POA: Diagnosis not present

## 2020-01-19 DIAGNOSIS — Z992 Dependence on renal dialysis: Secondary | ICD-10-CM | POA: Diagnosis not present

## 2020-01-22 DIAGNOSIS — Z992 Dependence on renal dialysis: Secondary | ICD-10-CM | POA: Diagnosis not present

## 2020-01-22 DIAGNOSIS — N2581 Secondary hyperparathyroidism of renal origin: Secondary | ICD-10-CM | POA: Diagnosis not present

## 2020-01-22 DIAGNOSIS — N186 End stage renal disease: Secondary | ICD-10-CM | POA: Diagnosis not present

## 2020-01-24 DIAGNOSIS — N186 End stage renal disease: Secondary | ICD-10-CM | POA: Diagnosis not present

## 2020-01-24 DIAGNOSIS — Z992 Dependence on renal dialysis: Secondary | ICD-10-CM | POA: Diagnosis not present

## 2020-01-24 DIAGNOSIS — N2581 Secondary hyperparathyroidism of renal origin: Secondary | ICD-10-CM | POA: Diagnosis not present

## 2020-01-25 ENCOUNTER — Telehealth: Payer: Self-pay

## 2020-01-25 NOTE — Telephone Encounter (Signed)
1.Medication Requested:ONETOUCH VERIO test strip  2. Pharmacy (Name, Street, City):[id number] - [place], [place] - [address]  3. On Med List: Yes   4. Last Visit with PCP: 1.7.21  5. Next visit date with PCP: 7.13.21    Agent: Please be advised that RX refills may take up to 3 business days. We ask that you follow-up with your pharmacy.

## 2020-01-25 NOTE — Telephone Encounter (Signed)
This was completed on 01/16/20.

## 2020-01-26 DIAGNOSIS — N186 End stage renal disease: Secondary | ICD-10-CM | POA: Diagnosis not present

## 2020-01-26 DIAGNOSIS — Z992 Dependence on renal dialysis: Secondary | ICD-10-CM | POA: Diagnosis not present

## 2020-01-26 DIAGNOSIS — N2581 Secondary hyperparathyroidism of renal origin: Secondary | ICD-10-CM | POA: Diagnosis not present

## 2020-01-29 DIAGNOSIS — Z992 Dependence on renal dialysis: Secondary | ICD-10-CM | POA: Diagnosis not present

## 2020-01-29 DIAGNOSIS — N186 End stage renal disease: Secondary | ICD-10-CM | POA: Diagnosis not present

## 2020-01-29 DIAGNOSIS — N2581 Secondary hyperparathyroidism of renal origin: Secondary | ICD-10-CM | POA: Diagnosis not present

## 2020-01-29 DIAGNOSIS — E1129 Type 2 diabetes mellitus with other diabetic kidney complication: Secondary | ICD-10-CM | POA: Diagnosis not present

## 2020-01-31 DIAGNOSIS — N186 End stage renal disease: Secondary | ICD-10-CM | POA: Diagnosis not present

## 2020-01-31 DIAGNOSIS — N2581 Secondary hyperparathyroidism of renal origin: Secondary | ICD-10-CM | POA: Diagnosis not present

## 2020-01-31 DIAGNOSIS — Z992 Dependence on renal dialysis: Secondary | ICD-10-CM | POA: Diagnosis not present

## 2020-02-01 NOTE — Progress Notes (Signed)
[facility] Note  02/06/2020     CHIEF COMPLAINT Patient presents for Retina Follow Up   HISTORY OF PRESENT ILLNESS: Carolyn Valenzuela is a 69 y.o. female who presents to the clinic today for:   HPI    Retina Follow Up    Patient presents with  Diabetic Retinopathy.  In right eye.  This started 21 months ago.  Severity is moderate.  I, the attending physician,  performed the HPI with the patient and updated documentation appropriately.          Comments    Patient here for 21 months retina follow up for PDR OD. Patient states vision doing better. No eye pain.        Last edited by Bernarda Caffey, MD on 02/06/2020 10:21 PM. (History)    Patient had been lost to f/u since 2019. Saw Dr. Zigmund Daniel last month, who restarted IVA OD and referred back to Dr. Coralyn Pear.  Referring physician: Biagio Borg, MD [place],  [place] [id number]  HISTORICAL INFORMATION:   Selected notes from the MEDICAL RECORD NUMBER Referred by Dr. Shirley Muscat for concern of DME OD and hard exudates OS    CURRENT MEDICATIONS: No current outpatient medications on file. (Ophthalmic Drugs)   Current Facility-Administered Medications (Ophthalmic Drugs)  Medication Route   triamcinolone acetonide (TRIESENCE) 40 MG/ML subtenons injection 4 mg Intravitreal   Current Outpatient Medications (Other)  Medication Sig   acetaminophen (TYLENOL) 500 MG tablet Take 500 mg by mouth every 6 (six) hours as needed for mild pain.   amLODipine (NORVASC) 10 MG tablet Take 10 mg by mouth at bedtime.   amLODipine (NORVASC) 5 MG tablet TAKE 1 TABLET BY MOUTH EVERY DAY (Patient not taking: Reported on 10/11/2019)   aspirin 81 MG EC tablet Take 81 mg by mouth daily.     clopidogrel (PLAVIX) 75 MG tablet TAKE 1 TABLET BY MOUTH EVERY DAY   famotidine (PEPCID) 20 MG tablet TAKE 1 TABLET BY MOUTH EVERY DAY (Patient not taking: Reported on 10/11/2019)   gabapentin (NEURONTIN) 100 MG capsule  TAKE 2 CAPSULES (200 MG TOTAL) BY MOUTH 3 (THREE) TIMES DAILY.   glipiZIDE (GLUCOTROL XL) 10 MG 24 hr tablet TAKE 1 TABLET (10 MG TOTAL) BY MOUTH DAILY WITH BREAKFAST.   glucose blood (ONETOUCH VERIO) test strip USE TO CHECK BLOOD SUGARS TWICE A DAY DX E11.9   HYDROcodone-acetaminophen (NORCO/VICODIN) 5-325 MG tablet Take 1 tablet by mouth every 4 (four) hours as needed.   JANUVIA 100 MG tablet TAKE 1 TABLET BY MOUTH EVERY DAY   lidocaine-prilocaine (EMLA) cream APPLY A SMALL AMOUNT TO SKIN 3 TIMES A WEEK OVER DIALYSIS SHUNT 30 MINUTES PRIOR TO DIALYSIS (Patient not taking: Reported on 10/11/2019)   multivitamin (RENA-VIT) TABS tablet TAKE 1 TABLET BY MOUTH EVERYDAY AT BEDTIME   nebivolol (BYSTOLIC) 5 MG tablet Take 1 tablet (5 mg total) by mouth daily.   ondansetron (ZOFRAN ODT) 4 MG disintegrating tablet Take 1 tablet (4 mg total) by mouth every 6 (six) hours as needed.   pantoprazole (PROTONIX) 40 MG tablet TAKE 1 TABLET BY MOUTH TWICE A DAY   No current facility-administered medications for this visit. (Other)      REVIEW OF SYSTEMS: ROS    Positive for: Endocrine, Cardiovascular, Eyes   Negative for: Constitutional, Gastrointestinal, Neurological, Skin, Genitourinary, Musculoskeletal, HENT, Respiratory, Psychiatric, Allergic/Imm, Heme/Lymph   Last edited by Theodore Demark, COA on 02/06/2020  9:47 AM. (History)  ALLERGIES No Known Allergies  PAST MEDICAL HISTORY Past Medical History:  Diagnosis Date   Allergic rhinitis, cause unspecified 12/08/2010   ANEMIA-IRON DEFICIENCY 01/25/2008   Aortic regurgitation    CAD (coronary artery disease)    a. 07/2016 NSTEMI/PCI: LM nl, LAD 70ost, 3m/d, 80d, D1 40ost, LCX 99ost/p (2.5x16 Synergy DES - 2.75), 40/20m, Om2 100 CTO, RCA  30p/m. Pt eval by CT surgery, not felt to be suitable candidate 2/2 comorbidities.   CERVICAL RADICULOPATHY, LEFT 4/48/1856   Chronic systolic CHF (congestive heart failure) ([place])    a.  07/2016 Echo: EF 40-45%, diff HK, sev AI, mild MVP w/ mod to sev MR, PASP 36mmHg.   Critical lower limb ischemia    DIABETES MELLITUS, UNCONTROLLED 05/21/2009   Esophagitis    ESRD on dialysis [facility])    Foot ulcer ([place])    right lateral malleolus   HCAP (healthcare-associated pneumonia) 06/29/2016   HYPERLIPIDEMIA 03/30/2007   HYPERTENSION 01/25/2008   Ischemic cardiomyopathy    a. 07/2016 Echo: EF 40-45%.   Mitral regurgitation    a. 07/2016 Echo: Mild MVP w/ mod to sev MR.   PVD (peripheral vascular disease) ([place])    a. s/p bilat BKA   SECONDARY HYPERPARATHYROIDISM 05/21/2009   Severe aortic regurgitation    a. 07/2016 Echo: Severe AI.   Past Surgical History:  Procedure Laterality Date   AMPUTATION Right 03/15/2014   Procedure: RIGHT  LEG  BELOW KNEE AMPUTATION ;  Surgeon: [name], MD;  Location: [place];  Service: Orthopedics;  Laterality: Right;   AMPUTATION Left 11/29/2015   Procedure: AMPUTATION BELOW KNEE;  Surgeon: Newt Minion, MD;  Location: [place];  Service: Orthopedics;  Laterality: Left;   AV FISTULA PLACEMENT Left    AV FISTULA PLACEMENT Left 09/06/2014   Procedure: ARTERIOVENOUS (AV) FISTULA CREATION-Left Brachiocephalic;  Surgeon: Conrad [place], MD;  Location: [place] [facility];  Service: Vascular;  Laterality: Left;   CARDIAC CATHETERIZATION N/A 06/30/2016   Procedure: Left Heart Cath and Coronary Angiography;  Surgeon: Burnell Blanks, MD;  Location: [place];  Service: Cardiovascular;  Laterality: N/A;   CARDIAC CATHETERIZATION N/A 07/02/2016   Procedure: Coronary Stent Intervention;  Surgeon: Nelva Bush, MD;  Location: [place] [facility];  Service: Cardiovascular;  Laterality: N/A;   CATARACT EXTRACTION     COLONOSCOPY     CORONARY ANGIOPLASTY WITH STENT PLACEMENT  07/02/2016   ESOPHAGOGASTRODUODENOSCOPY N/A 03/20/2016   Procedure: ESOPHAGOGASTRODUODENOSCOPY (EGD);  Surgeon: Milus Banister, MD;  Location: [place];  Service:  Endoscopy;  Laterality: N/A;   EYE SURGERY Bilateral    cataracts removed, left eye still has some oil in it.   INSERTION OF DIALYSIS CATHETER N/A 09/03/2014   Procedure: INSERTION OF DIALYSIS CATHETER RIGHT INTERNAL JUGULAR VEIN;  Surgeon: Conrad Nappanee, MD;  Location: [facility];  Service: Vascular;  Laterality: N/A;   PERIPHERAL VASCULAR CATHETERIZATION N/A 08/13/2015   Procedure: Abdominal Aortogram;  Surgeon: Elam Dutch, MD;  Location: [place] [facility];  Service: Cardiovascular;  Laterality: N/A;    FAMILY HISTORY Family History  Problem Relation Age of Onset   Cancer Mother        Pancreatic    SOCIAL HISTORY Social History   Tobacco Use   Smoking status: Never Smoker   Smokeless tobacco: Never Used  Substance Use Topics   Alcohol use: No    Alcohol/week: 0.0 standard drinks   Drug use: No         OPHTHALMIC  EXAM:  Base Eye Exam    Visual Acuity (Snellen - Linear)      Right Left   Dist cc 20/60 -2 20/50 -2   Dist ph cc NI 20/40 -1   Correction: Glasses       Tonometry (Tonopen, 9:41 AM)      Right Left   Pressure 11 13       Pupils      Dark Light Shape React APD   Right 3 2 Round Brisk None   Left 3 2 Round Brisk None       Visual Fields (Counting fingers)      Left Right    Full Full       Extraocular Movement      Right Left    Full, Ortho Full, Ortho       Neuro/Psych    Oriented x3: Yes   Mood/Affect: Normal       Dilation    Both eyes: 1.0% Mydriacyl, 2.5% Phenylephrine @ 9:41 AM        Slit Lamp and Fundus Exam    Slit Lamp Exam      Right Left   Lids/Lashes dermatochalasis dermatochalasis   Conjunctiva/Sclera White and quiet White and quiet   Cornea arcus; 1+ PEE; 2+ guttata arcus; 1-2+ guttae   Anterior Chamber Deep and quiet Deep and quiet   Iris Round, dilated; no NVI Round, dilated; large PI at 0600 no NVI   Lens PCIOL in good position, white, hazy opacity inferior IOL PCIOL in good position, open PC    Vitreous clear clear       Fundus Exam      Right Left   Disc Mild temp PPA, sharp rim, mild pallor Mild Pallor, sharp rim   C/D Ratio 0.3 0.35   Macula Blunted foveal reflex with positive edema--improving, Inferior Epiretinal membrane, scattered MA Blunted foveal reflex, scattered Exudates, Retinal pigment epithelial mottling, focal laser scars   Vessels Vascular attenuation, sclerotic arterioles--greatest nasal disc sclerotic arterioles, generalized attenuation   Periphery 360 PRP, scattered areas of fibrosis 360 PRP in place, areas of fibrosis        Refraction    Wearing Rx      Sphere Cylinder Axis Add   Right +0.25 +0.25 037 +2.50   Left [place] +0.75 175 +2.50          IMAGING AND PROCEDURES  Imaging and Procedures for 12/30/17  OCT, Retina - OU - Both Eyes       Right Eye Quality was good. Central Foveal Thickness: 355. Progression has improved. Findings include abnormal foveal contour, no SRF, intraretinal fluid, epiretinal membrane, intraretinal hyper-reflective material, outer retinal atrophy, inner retinal atrophy (Persistent IRF).   Left Eye Quality was good. Central Foveal Thickness: 257. Progression has been stable. Findings include abnormal foveal contour, no IRF, no SRF, macular pucker, epiretinal membrane, outer retinal atrophy (Rare cystic change temporally).   Notes *Images captured and stored on drive  Diagnosis / Impression:  OD: persistent DME/IRF--interval decrease in IRF; ERM stable from prior OS: no DME; +ERM w/ some pucker and preretinal fibrosis--stable   Clinical management:  See below  Abbreviations: NFP - Normal foveal profile. CME - cystoid macular edema. PED - pigment epithelial detachment. IRF - intraretinal fluid. SRF - subretinal fluid. EZ - ellipsoid zone. ERM - epiretinal membrane. ORA - outer retinal atrophy. ORT - outer retinal tubulation. SRHM - subretinal hyper-reflective material         Intravitreal Injection,  Pharmacologic Agent - OD - Right Eye       Time Out 02/06/2020. 10:59 AM. Confirmed correct patient, procedure, site, and patient consented.   Anesthesia Topical anesthesia was used. Anesthetic medications included Lidocaine 2%, Proparacaine 0.5%.   Procedure Preparation included 5% betadine to ocular surface, eyelid speculum. A supplied needle was used.   Injection:  1.25 mg Bevacizumab (AVASTIN) SOLN   NDC: 94503-888-28, Lot: [id number] , Expiration date: 04/03/2020   Route: Intravitreal, Site: Right Eye, Waste: 0 mL  Post-op Post injection exam found visual acuity of at least counting fingers. The patient tolerated the procedure well. There were no complications. The patient received written and verbal post procedure care education.                 ASSESSMENT/PLAN:    ICD-10-CM   1. Proliferative diabetic retinopathy of right eye with macular edema associated with type 2 diabetes mellitus (HCC)  21/11/2019 Intravitreal Injection, Pharmacologic Agent - OD - Right Eye    Bevacizumab (AVASTIN) SOLN 1.25 mg  2. Stable proliferative diabetic retinopathy of left eye associated with type 2 diabetes mellitus ([place])  [address]   3. Retinal edema  H35.81 OCT, Retina - OU - Both Eyes  4. Pseudophakia of both eyes  Z96.1   5. Corneal guttata  H18.519     1,2,3. Proliferative diabetic retinopathy OU-  - lost to f/u since September 2019, but saw JDM on 05.11.21, pt received IVA OD #5 on that day  - s/p PPV OU with [facility] in 2014  - s/p PRP OU with [facility]  - S/P IVA OD #1 (02.26.19), #2 (04.02.19), #3 (05.02.19), #4 (05.30.19), #5 (05.11.21 JDM)  - S/P IVTA #1 OD (06.27.19) -- switched due to minimal improvement on IVA, #2 (08.13.19)   - s/p IVE OD #1 (09.24.19) - sample  - exam shows good PRP 360 OU; sclerotic arterioles with sheathing OU; scattered DBH OU  - BCVA stable at 20/60-2 today  - OCT shows interval decrease in IRF OD -- good response to IVA with Dr. 10.07.19 last month on  05.11.21  - IOP stable at 11 mmHg, without spike OD  - recommend IVA OD #6 today, consent form obtained, signed, and scanned on 06.08.21  - Eylea benefits paperwork signed on 09.24.19 -- pt is approved for Eylea for 2021--hold off on eylea for now as IVA decreasing edema OD  - f/u in 4 weeks -- DFE/OCT, possible injection  4. Pseudophakia OU  - s/p CE/IOL OU by Dr. 05-08-1996  - beautiful surgeries, doing well  - monitor  5. Corneal guttata OU  - no edema  - monitor   Ophthalmic Meds Ordered this visit:  Meds ordered this encounter  Medications   Bevacizumab (AVASTIN) SOLN 1.25 mg       Return in about 4 weeks (around 03/05/2020) for DFE, OCT.  There are no Patient Instructions on file for this visit.   Explained the diagnoses, plan, and follow up with the patient and they expressed understanding.  Patient expressed understanding of the importance of proper follow up care.   This document serves as a record of services personally performed by 20/01/2020, MD, PhD. It was created on their behalf by Gardiner Sleeper, COMT. The creation of this record is the provider's dictation and/or activities during the visit.  Electronically signed by: Roselee Nova, COMT 02/06/20 10:25 PM  19/08/21, M.D., Ph.D. Diseases & Surgery of the Retina and Vitreous [facility] 02/06/2020   I have reviewed the above documentation for accuracy and completeness, and I agree with the above. Gardiner Sleeper, M.D., Ph.D. 02/06/20 10:25 PM   Abbreviations: M myopia (nearsighted); A astigmatism; H hyperopia (farsighted); P presbyopia; Mrx spectacle prescription;  CTL contact lenses; OD right eye; OS left eye; OU both eyes  XT exotropia; ET esotropia; PEK punctate epithelial keratitis; PEE punctate epithelial erosions; DES dry eye syndrome; MGD meibomian gland dysfunction; ATs artificial tears; PFAT's preservative free artificial tears; [place] nuclear sclerotic cataract; PSC  posterior subcapsular cataract; ERM epi-retinal membrane; PVD posterior vitreous detachment; RD retinal detachment; DM diabetes mellitus; DR diabetic retinopathy; NPDR non-proliferative diabetic retinopathy; PDR proliferative diabetic retinopathy; CSME clinically significant macular edema; DME diabetic macular edema; dbh dot blot hemorrhages; CWS cotton wool spot; POAG primary open angle glaucoma; C/D cup-to-disc ratio; HVF humphrey visual field; GVF goldmann visual field; OCT optical coherence tomography; IOP intraocular pressure; BRVO Branch retinal vein occlusion; CRVO central retinal vein occlusion; CRAO central retinal artery occlusion; BRAO branch retinal artery occlusion; RT retinal tear; SB scleral buckle; PPV pars plana vitrectomy; VH Vitreous hemorrhage; PRP panretinal laser photocoagulation; IVK intravitreal kenalog; VMT vitreomacular traction; MH Macular hole;  NVD neovascularization of the disc; NVE neovascularization elsewhere; AREDS age related eye disease study; ARMD age related macular degeneration; POAG primary open angle glaucoma; EBMD epithelial/anterior basement membrane dystrophy; ACIOL anterior chamber intraocular lens; IOL intraocular lens; PCIOL posterior chamber intraocular lens; Phaco/IOL phacoemulsification with intraocular lens placement; [place] photorefractive keratectomy; LASIK laser assisted in situ keratomileusis; HTN hypertension; DM diabetes mellitus; COPD chronic obstructive pulmonary disease

## 2020-02-02 ENCOUNTER — Other Ambulatory Visit: Payer: Self-pay

## 2020-02-02 ENCOUNTER — Telehealth: Payer: Self-pay

## 2020-02-02 DIAGNOSIS — N2581 Secondary hyperparathyroidism of renal origin: Secondary | ICD-10-CM | POA: Diagnosis not present

## 2020-02-02 DIAGNOSIS — Z992 Dependence on renal dialysis: Secondary | ICD-10-CM | POA: Diagnosis not present

## 2020-02-02 DIAGNOSIS — N186 End stage renal disease: Secondary | ICD-10-CM | POA: Diagnosis not present

## 2020-02-02 MED ORDER — ONETOUCH VERIO VI STRP: ORAL_STRIP | 5 refills | Status: DC

## 2020-02-02 NOTE — Telephone Encounter (Signed)
1.Medication Requested:ONETOUCH VERIO test strip  2. Pharmacy (Name, Street, City):[id number] [place], [place] [address]  3. On Med List: Yes   4. Last Visit with PCP: 1.7.21   5. Next visit date with PCP: 7.13.21    Agent: Please be advised that RX refills may take up to 3 business days. We ask that you follow-up with your pharmacy.

## 2020-02-05 DIAGNOSIS — N2581 Secondary hyperparathyroidism of renal origin: Secondary | ICD-10-CM | POA: Diagnosis not present

## 2020-02-05 DIAGNOSIS — N186 End stage renal disease: Secondary | ICD-10-CM | POA: Diagnosis not present

## 2020-02-05 DIAGNOSIS — Z992 Dependence on renal dialysis: Secondary | ICD-10-CM | POA: Diagnosis not present

## 2020-02-05 NOTE — Telephone Encounter (Signed)
Patient is calling to following on refill request.

## 2020-02-06 ENCOUNTER — Encounter (INDEPENDENT_AMBULATORY_CARE_PROVIDER_SITE_OTHER): Payer: Self-pay | Admitting: Ophthalmology

## 2020-02-06 ENCOUNTER — Ambulatory Visit (INDEPENDENT_AMBULATORY_CARE_PROVIDER_SITE_OTHER): Payer: Medicare PPO | Admitting: Ophthalmology

## 2020-02-06 ENCOUNTER — Telehealth: Payer: Self-pay | Admitting: Internal Medicine

## 2020-02-06 ENCOUNTER — Other Ambulatory Visit: Payer: Self-pay

## 2020-02-06 DIAGNOSIS — Z961 Presence of intraocular lens: Secondary | ICD-10-CM

## 2020-02-06 DIAGNOSIS — E113552 Type 2 diabetes mellitus with stable proliferative diabetic retinopathy, left eye: Secondary | ICD-10-CM | POA: Diagnosis not present

## 2020-02-06 DIAGNOSIS — H3581 Retinal edema: Secondary | ICD-10-CM

## 2020-02-06 DIAGNOSIS — H18519 Endothelial corneal dystrophy, unspecified eye: Secondary | ICD-10-CM | POA: Diagnosis not present

## 2020-02-06 DIAGNOSIS — E113511 Type 2 diabetes mellitus with proliferative diabetic retinopathy with macular edema, right eye: Secondary | ICD-10-CM | POA: Diagnosis not present

## 2020-02-06 MED ORDER — BEVACIZUMAB CHEMO INJECTION 1.25MG/0.05ML SYRINGE FOR KALEIDOSCOPE
1.2500 mg | INTRAVITREAL | Status: AC | PRN
  Administered 2020-02-06: 1.25 mg via INTRAVITREAL

## 2020-02-06 NOTE — Telephone Encounter (Signed)
New message:    Pt is calling and states that she needs a new glucometer that is Accu Check and test strips and lancets. She states her insurance with not pay for the onetouch. Pt states she is completely out of supplies. Please advise.

## 2020-02-07 DIAGNOSIS — N2581 Secondary hyperparathyroidism of renal origin: Secondary | ICD-10-CM | POA: Diagnosis not present

## 2020-02-07 DIAGNOSIS — Z992 Dependence on renal dialysis: Secondary | ICD-10-CM | POA: Diagnosis not present

## 2020-02-07 DIAGNOSIS — N186 End stage renal disease: Secondary | ICD-10-CM | POA: Diagnosis not present

## 2020-02-08 NOTE — Telephone Encounter (Signed)
New Message:   Pt is calling back to see what kind of test strips her insurance will pay for. She states she is currently out and is unable to pay for them due to the cost. She states [facility] Please advise.

## 2020-02-09 ENCOUNTER — Other Ambulatory Visit: Payer: Self-pay

## 2020-02-09 DIAGNOSIS — N2581 Secondary hyperparathyroidism of renal origin: Secondary | ICD-10-CM | POA: Diagnosis not present

## 2020-02-09 DIAGNOSIS — N186 End stage renal disease: Secondary | ICD-10-CM | POA: Diagnosis not present

## 2020-02-09 DIAGNOSIS — Z992 Dependence on renal dialysis: Secondary | ICD-10-CM | POA: Diagnosis not present

## 2020-02-09 MED ORDER — GLUCOSE BLOOD VI STRP: ORAL_STRIP | 12 refills | Status: DC

## 2020-02-09 MED ORDER — ACCU-CHEK GUIDE ME W/DEVICE KIT: PACK | 0 refills | Status: DC

## 2020-02-09 MED ORDER — ACCU-CHEK SOFTCLIX LANCETS MISC: 12 refills | Status: DC

## 2020-02-09 NOTE — Telephone Encounter (Signed)
I spoke with pts pharmacy and was able to get the clarification on what was going on with pts insurance and was able to send in new rx for covered device, test strips and lancets.

## 2020-02-12 DIAGNOSIS — N2581 Secondary hyperparathyroidism of renal origin: Secondary | ICD-10-CM | POA: Diagnosis not present

## 2020-02-12 DIAGNOSIS — Z992 Dependence on renal dialysis: Secondary | ICD-10-CM | POA: Diagnosis not present

## 2020-02-12 DIAGNOSIS — N186 End stage renal disease: Secondary | ICD-10-CM | POA: Diagnosis not present

## 2020-02-14 DIAGNOSIS — N2581 Secondary hyperparathyroidism of renal origin: Secondary | ICD-10-CM | POA: Diagnosis not present

## 2020-02-14 DIAGNOSIS — Z992 Dependence on renal dialysis: Secondary | ICD-10-CM | POA: Diagnosis not present

## 2020-02-14 DIAGNOSIS — N186 End stage renal disease: Secondary | ICD-10-CM | POA: Diagnosis not present

## 2020-02-15 DIAGNOSIS — Z992 Dependence on renal dialysis: Secondary | ICD-10-CM | POA: Diagnosis not present

## 2020-02-15 DIAGNOSIS — I871 Compression of vein: Secondary | ICD-10-CM | POA: Diagnosis not present

## 2020-02-15 DIAGNOSIS — N186 End stage renal disease: Secondary | ICD-10-CM | POA: Diagnosis not present

## 2020-02-16 DIAGNOSIS — N186 End stage renal disease: Secondary | ICD-10-CM | POA: Diagnosis not present

## 2020-02-16 DIAGNOSIS — N2581 Secondary hyperparathyroidism of renal origin: Secondary | ICD-10-CM | POA: Diagnosis not present

## 2020-02-16 DIAGNOSIS — Z992 Dependence on renal dialysis: Secondary | ICD-10-CM | POA: Diagnosis not present

## 2020-02-19 DIAGNOSIS — Z992 Dependence on renal dialysis: Secondary | ICD-10-CM | POA: Diagnosis not present

## 2020-02-19 DIAGNOSIS — N2581 Secondary hyperparathyroidism of renal origin: Secondary | ICD-10-CM | POA: Diagnosis not present

## 2020-02-19 DIAGNOSIS — N186 End stage renal disease: Secondary | ICD-10-CM | POA: Diagnosis not present

## 2020-02-21 DIAGNOSIS — Z992 Dependence on renal dialysis: Secondary | ICD-10-CM | POA: Diagnosis not present

## 2020-02-21 DIAGNOSIS — N186 End stage renal disease: Secondary | ICD-10-CM | POA: Diagnosis not present

## 2020-02-21 DIAGNOSIS — N2581 Secondary hyperparathyroidism of renal origin: Secondary | ICD-10-CM | POA: Diagnosis not present

## 2020-02-23 DIAGNOSIS — N186 End stage renal disease: Secondary | ICD-10-CM | POA: Diagnosis not present

## 2020-02-23 DIAGNOSIS — Z992 Dependence on renal dialysis: Secondary | ICD-10-CM | POA: Diagnosis not present

## 2020-02-23 DIAGNOSIS — N2581 Secondary hyperparathyroidism of renal origin: Secondary | ICD-10-CM | POA: Diagnosis not present

## 2020-02-26 DIAGNOSIS — N2581 Secondary hyperparathyroidism of renal origin: Secondary | ICD-10-CM | POA: Diagnosis not present

## 2020-02-26 DIAGNOSIS — N186 End stage renal disease: Secondary | ICD-10-CM | POA: Diagnosis not present

## 2020-02-26 DIAGNOSIS — Z992 Dependence on renal dialysis: Secondary | ICD-10-CM | POA: Diagnosis not present

## 2020-02-28 DIAGNOSIS — E1129 Type 2 diabetes mellitus with other diabetic kidney complication: Secondary | ICD-10-CM | POA: Diagnosis not present

## 2020-02-28 DIAGNOSIS — N186 End stage renal disease: Secondary | ICD-10-CM | POA: Diagnosis not present

## 2020-02-28 DIAGNOSIS — Z992 Dependence on renal dialysis: Secondary | ICD-10-CM | POA: Diagnosis not present

## 2020-02-28 DIAGNOSIS — N2581 Secondary hyperparathyroidism of renal origin: Secondary | ICD-10-CM | POA: Diagnosis not present

## 2020-03-01 DIAGNOSIS — N2581 Secondary hyperparathyroidism of renal origin: Secondary | ICD-10-CM | POA: Diagnosis not present

## 2020-03-01 DIAGNOSIS — Z992 Dependence on renal dialysis: Secondary | ICD-10-CM | POA: Diagnosis not present

## 2020-03-01 DIAGNOSIS — N186 End stage renal disease: Secondary | ICD-10-CM | POA: Diagnosis not present

## 2020-03-04 DIAGNOSIS — N186 End stage renal disease: Secondary | ICD-10-CM | POA: Diagnosis not present

## 2020-03-04 DIAGNOSIS — Z992 Dependence on renal dialysis: Secondary | ICD-10-CM | POA: Diagnosis not present

## 2020-03-04 DIAGNOSIS — N2581 Secondary hyperparathyroidism of renal origin: Secondary | ICD-10-CM | POA: Diagnosis not present

## 2020-03-06 DIAGNOSIS — Z992 Dependence on renal dialysis: Secondary | ICD-10-CM | POA: Diagnosis not present

## 2020-03-06 DIAGNOSIS — N2581 Secondary hyperparathyroidism of renal origin: Secondary | ICD-10-CM | POA: Diagnosis not present

## 2020-03-06 DIAGNOSIS — N186 End stage renal disease: Secondary | ICD-10-CM | POA: Diagnosis not present

## 2020-03-07 ENCOUNTER — Other Ambulatory Visit: Payer: Self-pay | Admitting: Internal Medicine

## 2020-03-08 DIAGNOSIS — N2581 Secondary hyperparathyroidism of renal origin: Secondary | ICD-10-CM | POA: Diagnosis not present

## 2020-03-08 DIAGNOSIS — N186 End stage renal disease: Secondary | ICD-10-CM | POA: Diagnosis not present

## 2020-03-08 DIAGNOSIS — Z992 Dependence on renal dialysis: Secondary | ICD-10-CM | POA: Diagnosis not present

## 2020-03-11 ENCOUNTER — Other Ambulatory Visit: Payer: Self-pay | Admitting: Internal Medicine

## 2020-03-11 DIAGNOSIS — N186 End stage renal disease: Secondary | ICD-10-CM | POA: Diagnosis not present

## 2020-03-11 DIAGNOSIS — N2581 Secondary hyperparathyroidism of renal origin: Secondary | ICD-10-CM | POA: Diagnosis not present

## 2020-03-11 DIAGNOSIS — Z992 Dependence on renal dialysis: Secondary | ICD-10-CM | POA: Diagnosis not present

## 2020-03-12 ENCOUNTER — Ambulatory Visit (INDEPENDENT_AMBULATORY_CARE_PROVIDER_SITE_OTHER): Payer: Medicare PPO

## 2020-03-12 ENCOUNTER — Other Ambulatory Visit: Payer: Self-pay

## 2020-03-12 ENCOUNTER — Ambulatory Visit (INDEPENDENT_AMBULATORY_CARE_PROVIDER_SITE_OTHER): Payer: Medicare PPO | Admitting: Internal Medicine

## 2020-03-12 ENCOUNTER — Encounter: Payer: Self-pay | Admitting: Internal Medicine

## 2020-03-12 VITALS — BP 120/60 | HR 69 | Temp 98.8°F | Ht <= 58 in | Wt 132.0 lb

## 2020-03-12 DIAGNOSIS — M545 Low back pain, unspecified: Secondary | ICD-10-CM | POA: Insufficient documentation

## 2020-03-12 DIAGNOSIS — E1152 Type 2 diabetes mellitus with diabetic peripheral angiopathy with gangrene: Secondary | ICD-10-CM

## 2020-03-12 DIAGNOSIS — R0609 Other forms of dyspnea: Secondary | ICD-10-CM

## 2020-03-12 DIAGNOSIS — E7800 Pure hypercholesterolemia, unspecified: Secondary | ICD-10-CM | POA: Diagnosis not present

## 2020-03-12 DIAGNOSIS — I10 Essential (primary) hypertension: Secondary | ICD-10-CM

## 2020-03-12 DIAGNOSIS — R0600 Dyspnea, unspecified: Secondary | ICD-10-CM | POA: Diagnosis not present

## 2020-03-12 DIAGNOSIS — I739 Peripheral vascular disease, unspecified: Secondary | ICD-10-CM

## 2020-03-12 DIAGNOSIS — G8929 Other chronic pain: Secondary | ICD-10-CM | POA: Insufficient documentation

## 2020-03-12 MED ORDER — TRAMADOL HCL 50 MG PO TABS: 50.0000 mg | ORAL_TABLET | Freq: Four times a day (QID) | ORAL | 0 refills | Status: DC | PRN

## 2020-03-12 MED ORDER — ROSUVASTATIN CALCIUM 20 MG PO TABS
20.0000 mg | ORAL_TABLET | Freq: Every day | ORAL | 3 refills | Status: DC
Start: 2020-03-12 — End: 2020-03-14

## 2020-03-12 NOTE — Assessment & Plan Note (Addendum)
Ongoing, possibly worse recently, exam benign, for cxr  I spent 41 minutes in preparing to see the patient by review of recent labs, imaging and procedures, obtaining and reviewing separately obtained history, communicating with the patient and family or caregiver, ordering medications, tests or procedures, and documenting clinical information in the EHR including the differential Dx, treatment, and any further evaluation and other management of dyspnea, dm, chronic lbp, pvd with need for new bilateral prosthetics, HTn, hld, DM

## 2020-03-12 NOTE — Progress Notes (Signed)
[facility] Note  03/14/2020     CHIEF COMPLAINT Patient presents for Retina Follow Up   HISTORY OF PRESENT ILLNESS: Carolyn Valenzuela is a 69 y.o. female who presents to the clinic today for:   HPI    Retina Follow Up    Patient presents with  Diabetic Retinopathy.  In right eye.  This started weeks ago.  Severity is moderate.  Duration of weeks.  Since onset it is gradually improving.  I, the attending physician,  performed the HPI with the patient and updated documentation appropriately.          Comments    Pt states her vision is improving OU.  Patient denies eye pain or discomfort and denies any new or worsening floaters or fol OU.       Last edited by Bernarda Caffey, MD on 03/14/2020  9:32 AM. (History)    Patient  Referring physician: Biagio Borg, MD [place],  [place] [address]  HISTORICAL INFORMATION:   Selected notes from the MEDICAL RECORD NUMBER Referred by Dr. Shirley Muscat for concern of DME OD and hard exudates OS    CURRENT MEDICATIONS: No current outpatient medications on file. (Ophthalmic Drugs)   Current Facility-Administered Medications (Ophthalmic Drugs)  Medication Route  . triamcinolone acetonide (TRIESENCE) 40 MG/ML subtenons injection 4 mg Intravitreal   Current Outpatient Medications (Other)  Medication Sig  . Accu-Chek Softclix Lancets lancets Use to check blood sugars 2x a day. DX: E11.9  . acetaminophen (TYLENOL) 500 MG tablet Take 500 mg by mouth every 6 (six) hours as needed for mild pain.  [name] amLODipine (NORVASC) 10 MG tablet Take 10 mg by mouth at bedtime.  [name] aspirin 81 MG EC tablet Take 81 mg by mouth daily.    . Blood Glucose Monitoring Suppl (ACCU-CHEK GUIDE ME) w/Device KIT USE DEVICE TO CHECK BLOOD SUGARS 2X A DAY. DX: E11.9  . clopidogrel (PLAVIX) 75 MG tablet TAKE 1 TABLET BY MOUTH EVERY DAY  . gabapentin (NEURONTIN) 100 MG capsule TAKE 2 CAPSULES (200 MG TOTAL) BY MOUTH 3 (THREE) TIMES  DAILY.  [name] glipiZIDE (GLUCOTROL XL) 10 MG 24 hr tablet TAKE 1 TABLET (10 MG TOTAL) BY MOUTH DAILY WITH BREAKFAST.  [name] glucose blood test strip Use to check Blood Sugars 2x a day. DX: E11.9  . Influenza Vac High-Dose Quad (FLUZONE HIGH-DOSE QUADRIVALENT IM)   . JANUVIA 100 MG tablet TAKE 1 TABLET BY MOUTH EVERY DAY  . multivitamin (RENA-VIT) TABS tablet TAKE 1 TABLET BY MOUTH EVERYDAY AT BEDTIME  . nebivolol (BYSTOLIC) 5 MG tablet Take 1 tablet (5 mg total) by mouth daily.  . ondansetron (ZOFRAN ODT) 4 MG disintegrating tablet Take 1 tablet (4 mg total) by mouth every 6 (six) hours as needed.  . pantoprazole (PROTONIX) 40 MG tablet TAKE 1 TABLET BY MOUTH TWICE A DAY  . rosuvastatin (CRESTOR) 40 MG tablet Take 1 tablet (40 mg total) by mouth daily.  . traMADol (ULTRAM) 50 MG tablet Take 1 tablet (50 mg total) by mouth every 6 (six) hours as needed.  . VELPHORO 500 MG chewable tablet    No current facility-administered medications for this visit. (Other)      REVIEW OF SYSTEMS: ROS    Positive for: Endocrine, Cardiovascular, Eyes   Negative for: Constitutional, Gastrointestinal, Neurological, Skin, Genitourinary, Musculoskeletal, HENT, Respiratory, Psychiatric, Allergic/Imm, Heme/Lymph   Last edited by Doneen Poisson on 03/14/2020  8:43 AM. (History)  ALLERGIES No Known Allergies  PAST MEDICAL HISTORY Past Medical History:  Diagnosis Date  . Allergic rhinitis, cause unspecified 12/08/2010  . ANEMIA-IRON DEFICIENCY 01/25/2008  . Aortic regurgitation   . CAD (coronary artery disease)    a. 07/2016 NSTEMI/PCI: LM nl, LAD 70ost, 10md, 80d, D1 40ost, LCX 99ost/p (2.5x16 Synergy DES - 2.75), 40/629mOm2 100 CTO, RCA  30p/m. Pt eval by CT surgery, not felt to be suitable candidate 2/2 comorbidities.  . CERVICAL RADICULOPATHY, LEFT 01/25/2008  . Chronic systolic CHF (congestive heart failure) ([facility]   a. 07/2016 Echo: EF 40-45%, diff HK, sev AI, mild MVP w/ mod to sev MR, PASP 5154m.   . Critical lower limb ischemia   . DIABETES MELLITUS, UNCONTROLLED 05/21/2009  . Esophagitis   . [place] ([place] . Foot ulcer ([facility]  right lateral malleolus  . HCAP (healthcare-associated pneumonia) 06/29/2016  . HYPERLIPIDEMIA 03/30/2007  . HYPERTENSION 01/25/2008  . Ischemic cardiomyopathy    a. 07/2016 Echo: EF 40-45%.  . Mitral regurgitation    a. 07/2016 Echo: Mild MVP w/ mod to sev MR.  . PMarland KitchenD (peripheral vascular disease) ([place]  a. s/p bilat BKA  . SECONDARY HYPERPARATHYROIDISM 05/21/2009  . Severe aortic regurgitation    a. 07/2016 Echo: Severe AI.   Past Surgical History:  Procedure Laterality Date  . AMPUTATION Right 03/15/2014   Procedure: RIGHT  LEG  BELOW KNEE AMPUTATION ;  Surgeon: JohWylene SimmerD;  [facility];  Laterality: Right;  . AMPUTATION Left 11/29/2015   Procedure: AMPUTATION BELOW KNEE;  Surgeon: MarNewt MinionD;  [facility] [place];  Laterality: Left;  . AV FISTULA PLACEMENT Left   . AV FISTULA PLACEMENT Left 09/06/2014   Procedure: ARTERIOVENOUS (AV) FISTULA CREATION-Left Brachiocephalic;  Surgeon: BriConrad BurlingtonD;  [facility];  Laterality: Left;  . CARDIAC CATHETERIZATION N/A 06/30/2016   Procedure: Left Heart Cath and Coronary Angiography;  Surgeon: ChrBurnell BlanksD;  [facility] [place] [facility];  Service: Cardiovascular;  Laterality: N/A;  . CARDIAC CATHETERIZATION N/A 07/02/2016   Procedure: Coronary Stent Intervention;  Surgeon: ChrNelva BushD;  [facility] [place] [facility];  Service: Cardiovascular;  Laterality: N/A;  . CATARACT EXTRACTION    . COLONOSCOPY    . CORONARY ANGIOPLASTY WITH STENT PLACEMENT  07/02/2016  . ESOPHAGOGASTRODUODENOSCOPY N/A 03/20/2016   Procedure: ESOPHAGOGASTRODUODENOSCOPY (EGD);  Surgeon: DanMilus BanisterD;  [facility];  Laterality: N/A;  . EYE SURGERY Bilateral    cataracts removed, left eye  still has some oil in it.  . INSERTION OF DIALYSIS CATHETER N/A 09/03/2014   Procedure: INSERTION OF DIALYSIS CATHETER RIGHT INTERNAL JUGULAR VEIN;  Surgeon: BriConrad BurlingtonD;  [facility];  Laterality: N/A;  . PERIPHERAL VASCULAR CATHETERIZATION N/A 08/13/2015   Procedure: Abdominal Aortogram;  Surgeon: ChaElam DutchD;  [facility];  Service: Cardiovascular;  Laterality: N/A;    FAMILY HISTORY Family History  Problem Relation Age of Onset  . Cancer Mother        Pancreatic    SOCIAL HISTORY Social History   Tobacco Use  . Smoking status: Never Smoker  . Smokeless tobacco: Never Used  Vaping Use  . Vaping Use: Never used  Substance Use Topics  . Alcohol use: No    Alcohol/week: 0.0 standard drinks  . Drug use: No  OPHTHALMIC EXAM:  Base Eye Exam    Visual Acuity (Snellen - Linear)      Right Left   Dist cc 20/40 -2 20/50 -2   Dist ph cc NI 20/40 +1   Correction: Glasses       Tonometry (Tonopen, 8:49 AM)      Right Left   Pressure 13 14       Pupils      Dark Light Shape React APD   Right 2 1 Round Minimal 0   Left 2 1 Round Minimal 0       Visual Fields      Left Right    Full Full       Extraocular Movement      Right Left    Full Full       Neuro/Psych    Oriented x3: Yes   Mood/Affect: Normal       Dilation    Both eyes: 1.0% Mydriacyl, 2.5% Phenylephrine @ 8:49 AM        Slit Lamp and Fundus Exam    Slit Lamp Exam      Right Left   Lids/Lashes dermatochalasis dermatochalasis   Conjunctiva/Sclera White and quiet White and quiet   Cornea arcus; 1+ PEE; 2+ guttata arcus; 1-2+ guttae   Anterior Chamber Deep and quiet Deep and quiet   Iris Round, dilated; no NVI Round, dilated; large PI at 0600 no NVI   Lens PCIOL in good position, white, hazy opacity inferior IOL PCIOL in good position, open PC   Vitreous clear clear       Fundus Exam      Right Left   Disc Mild temp PPA, sharp  rim, mild pallor Mild Pallor, sharp rim   C/D Ratio 0.3 0.35   Macula Blunted foveal reflex with +edema--persistent, Inferior Epiretinal membrane, scattered MA Blunted foveal reflex, scattered Exudates, Retinal pigment epithelial mottling, focal laser scars   Vessels Vascular attenuation, sclerotic arterioles--greatest nasal disc, mild tortuousity sclerotic arterioles, generalized attenuation   Periphery 360 PRP, scattered areas of fibrosis 360 PRP in place, areas of fibrosis        Refraction    Wearing Rx      Sphere Cylinder Axis Add   Right +0.25 +0.25 037 +2.50   Left [place] +0.75 175 +2.50          IMAGING AND PROCEDURES  Imaging and Procedures for 12/30/17  OCT, Retina - OU - Both Eyes       Right Eye Quality was good. Central Foveal Thickness: 348. Progression has been stable. Findings include abnormal foveal contour, no SRF, intraretinal fluid, epiretinal membrane, intraretinal hyper-reflective material, outer retinal atrophy, inner retinal atrophy (Persistent IRF/DME -- no significant improvement from prior).   Left Eye Quality was good. Central Foveal Thickness: 259. Progression has been stable. Findings include abnormal foveal contour, no IRF, no SRF, macular pucker, epiretinal membrane, outer retinal atrophy (Rare cystic change temporally).   Notes *Images captured and stored on drive  Diagnosis / Impression:  OD: persistent DME/IRF; ERM stable from prior OS: no DME; +ERM w/ some pucker and preretinal fibrosis--stable   Clinical management:  See below  Abbreviations: NFP - Normal foveal profile. CME - cystoid macular edema. PED - pigment epithelial detachment. IRF - intraretinal fluid. SRF - subretinal fluid. EZ - ellipsoid zone. ERM - epiretinal membrane. ORA - outer retinal atrophy. ORT - outer retinal tubulation. SRHM - subretinal hyper-reflective material  Intravitreal Injection, Pharmacologic Agent - OD - Right Eye       Time Out 03/14/2020.  9:42 AM. Confirmed correct patient, procedure, site, and patient consented.   Anesthesia Topical anesthesia was used. Anesthetic medications included Lidocaine 2%, Proparacaine 0.5%.   Procedure Preparation included 5% betadine to ocular surface, eyelid speculum. A (32g) needle was used.   Injection:  2 mg aflibercept Alfonse Flavors) SOLN   NDC: [id number], Lot: [id number], Expiration date: 05/30/2020   Route: Intravitreal, Site: Right Eye, Waste: 0.05 mL  Post-op Post injection exam found visual acuity of at least counting fingers. The patient tolerated the procedure well. There were no complications. The patient received written and verbal post procedure care education.                 ASSESSMENT/PLAN:    ICD-10-CM   1. Proliferative diabetic retinopathy of right eye with macular edema associated with type 2 diabetes mellitus (HCC)  T24.5809 Intravitreal Injection, Pharmacologic Agent - OD - Right Eye    aflibercept (EYLEA) SOLN 2 mg  2. Retinal edema  H35.81 OCT, Retina - OU - Both Eyes  3. Stable proliferative diabetic retinopathy of left eye associated with type 2 diabetes mellitus ([place])  X83.3825   4. Pseudophakia of both eyes  Z96.1   5. Corneal guttata  H18.519     1,2,3. Proliferative diabetic retinopathy OU-  - lost to f/u since September 2019, but saw JDM on 05.11.21, pt received IVA OD #5 on that day  - s/p PPV OU with [facility] in 2014  - s/p PRP OU with [facility]  - S/P IVA OD #1 (02.26.19), #2 (04.02.19), #3 (05.02.19), #4 (05.30.19), #5 (05.11.21 JDM), #6 (06.08.21)  - S/P IVTA #1 OD (06.27.19) -- switched due to minimal improvement on IVA, #2 (08.13.19)   - s/p IVE OD #1 (09.24.19) - sample  - exam shows good PRP 360 OU; sclerotic arterioles with sheathing OU; scattered DBH OU  - BCVA stable at 20/40-2 today  - OCT shows persistent IRF OD -- recommend switch to [facility] today  - recommend IVE OD #2 today, 07.15.2021  - [facility] consent form obtained, signed, and scanned  on 06.08.21  - Eylea informed consent form signed and scanned on 07.15.2021  - Eylea benefits paperwork signed on 09.24.19 -- pt is approved for Eylea for 2021  - f/u in 4 weeks -- DFE/OCT, possible injection  4. Pseudophakia OU  - s/p CE/IOL OU by Dr. Elliot Dally  - beautiful surgeries, doing well  - monitor  5. Corneal guttata OU  - no edema  - monitor   Ophthalmic Meds Ordered this visit:  Meds ordered this encounter  Medications  . aflibercept (EYLEA) SOLN 2 mg       Return in about 4 weeks (around 04/11/2020) for f/u PDR OU, DFE, OCT.  There are no Patient Instructions on file for this visit.   Explained the diagnoses, plan, and follow up with the patient and they expressed understanding.  Patient expressed understanding of the importance of proper follow up care.   This document serves as a record of services personally performed by Gardiner Sleeper, MD, PhD. It was created on their behalf by Leonie Douglas, an ophthalmic technician. The creation of this record is the provider's dictation and/or activities during the visit.    Electronically signed by: Leonie Douglas COA, 03/14/20  10:46 PM   This document serves as a record of services personally performed by Gardiner Sleeper, MD, PhD. It  was created on their behalf by San Jetty. Owens Shark, OA an ophthalmic technician. The creation of this record is the provider's dictation and/or activities during the visit.    Electronically signed by: San Jetty. Marguerita Merles 07.15.2021 10:46 PM   Gardiner Sleeper, M.D., Ph.D. Diseases & Surgery of the Retina and Vitreous [facility]  I have reviewed the above documentation for accuracy and completeness, and I agree with the above. Gardiner Sleeper, M.D., Ph.D. 03/14/20 10:47 PM    Abbreviations: M myopia (nearsighted); A astigmatism; H hyperopia (farsighted); P presbyopia; Mrx spectacle prescription;  CTL contact lenses; OD right eye; OS left eye; OU both eyes  XT  exotropia; ET esotropia; PEK punctate epithelial keratitis; PEE punctate epithelial erosions; DES dry eye syndrome; MGD meibomian gland dysfunction; ATs artificial tears; PFAT's preservative free artificial tears; [place] nuclear sclerotic cataract; PSC posterior subcapsular cataract; ERM epi-retinal membrane; PVD posterior vitreous detachment; RD retinal detachment; DM diabetes mellitus; DR diabetic retinopathy; NPDR non-proliferative diabetic retinopathy; PDR proliferative diabetic retinopathy; CSME clinically significant macular edema; DME diabetic macular edema; dbh dot blot hemorrhages; CWS cotton wool spot; POAG primary open angle glaucoma; C/D cup-to-disc ratio; HVF humphrey visual field; GVF goldmann visual field; OCT optical coherence tomography; IOP intraocular pressure; BRVO Branch retinal vein occlusion; CRVO central retinal vein occlusion; CRAO central retinal artery occlusion; BRAO branch retinal artery occlusion; RT retinal tear; SB scleral buckle; PPV pars plana vitrectomy; VH Vitreous hemorrhage; PRP panretinal laser photocoagulation; IVK intravitreal kenalog; VMT vitreomacular traction; MH Macular hole;  NVD neovascularization of the disc; NVE neovascularization elsewhere; AREDS age related eye disease study; ARMD age related macular degeneration; POAG primary open angle glaucoma; EBMD epithelial/anterior basement membrane dystrophy; ACIOL anterior chamber intraocular lens; IOL intraocular lens; PCIOL posterior chamber intraocular lens; Phaco/IOL phacoemulsification with intraocular lens placement; [place] photorefractive keratectomy; LASIK laser assisted in situ keratomileusis; HTN hypertension; DM diabetes mellitus; COPD chronic obstructive pulmonary disease

## 2020-03-12 NOTE — Progress Notes (Signed)
Subjective:    Patient ID: Carolyn Valenzuela, female    DOB: Jun 09, 1951, 69 y.o.   MRN: [id number]  HPI  Here to f/u; overall doing ok,  Pt denies chest pain, increasing sob or doe, wheezing, orthopnea, PND, increased LE swelling, palpitations, dizziness or syncope except mild worsening sob/doe and fatigue walking less than 100 yds and then has to stop to rest about 2 wks.  Not sure about wt gain. Still with HD M-W-F. [name]  Pt denies new neurological symptoms such as new headache, or facial or extremity weakness or numbness.  Pt denies polydipsia, polyuria, or low sugar episode.  Pt states overall good compliance with meds.   Pt denies fever, w night sweats, loss of appetite, or other constitutional symptoms No falls.  Does need new bilateral leg prosthetics.  Echo in sept 2020 with normal EF .  Chart documentation per cardiology with suspicion of doe due to deconditioning and underlying residual CAD    pCXR feb 2021 - neg for acute.  Never smoker, non ETOH.   Pt needs documentation for getting new prosthetic approved.  Pt is a bilateral transtibial amputee.  The patient does not hae any new comorbidities that will impact her mobility or her ability to function with her prosthetics.  Pt currently has bilateral below the knee prosthesis that are fitting poorly due to volume changes in her residual lumbs and her prostheticfeet are worn out.  Patient verbally communicates a strong desire to get new prosthetics to improve comfort and function.  Patient uses a cane or walker but this does not affect her ability to use her prosthetics.  Pt is a K2 level ambulator that spends some time walking around inside and outside her home over most obstacles.  Patient will benefit from new bilateral below knee prosthetics.        Also with Pt continues to have recurring LBP without change in severity, bowel or bladder change, fever, wt loss,  worsening LE pain/numbness/weakness, gait change or falls, has known lumbar  spondylosis.  Past Medical History:  Diagnosis Date  . Allergic rhinitis, cause unspecified 12/08/2010  . ANEMIA-IRON DEFICIENCY 01/25/2008  . Aortic regurgitation   . CAD (coronary artery disease)    a. 07/2016 NSTEMI/PCI: LM nl, LAD 70ost, 61md, 80d, D1 40ost, LCX 99ost/p (2.5x16 Synergy DES - 2.75), 40/642mOm2 100 CTO, RCA  30p/m. Pt eval by CT surgery, not felt to be suitable candidate 2/2 comorbidities.  . CERVICAL RADICULOPATHY, LEFT 01/25/2008  . Chronic systolic CHF (congestive heart failure) ([facility]   a. 07/2016 Echo: EF 40-45%, diff HK, sev AI, mild MVP w/ mod to sev MR, PASP 5128m.  . Critical lower limb ischemia   . DIABETES MELLITUS, UNCONTROLLED 05/21/2009  . Esophagitis   . ESRD on dialysis ([facility] . Foot ulcer ([place]CC[place]  right lateral malleolus  . HCAP (healthcare-associated pneumonia) 06/29/2016  . HYPERLIPIDEMIA 03/30/2007  . HYPERTENSION 01/25/2008  . Ischemic cardiomyopathy    a. 07/2016 Echo: EF 40-45%.  . Mitral regurgitation    a. 07/2016 Echo: Mild MVP w/ mod to sev MR.  . PMarland KitchenD (peripheral vascular disease) (HCCR[place]  a. s/p bilat BKA  . SECONDARY HYPERPARATHYROIDISM 05/21/2009  . Severe aortic regurgitation    a. 07/2016 Echo: Severe AI.   Past Surgical History:  Procedure Laterality Date  . AMPUTATION Right 03/15/2014   Procedure: RIGHT  LEG  BELOW KNEE AMPUTATION ;  Surgeon: JohWylene SimmerD;  [facility];  Service: Orthopedics;  Laterality: Right;  . AMPUTATION Left 11/29/2015   Procedure: AMPUTATION BELOW KNEE;  Surgeon: Newt Minion, MD;  Location: [place];  Service: Orthopedics;  Laterality: Left;  . AV FISTULA PLACEMENT Left   . AV FISTULA PLACEMENT Left 09/06/2014   Procedure: ARTERIOVENOUS (AV) FISTULA CREATION-Left Brachiocephalic;  Surgeon: Conrad Gowrie, MD;  Location: [place];  Service: Vascular;  Laterality: Left;  . CARDIAC CATHETERIZATION N/A 06/30/2016   Procedure: Left Heart Cath and Coronary Angiography;  Surgeon: Burnell Blanks, MD;   Location: [place] [facility];  Service: Cardiovascular;  Laterality: N/A;  . CARDIAC CATHETERIZATION N/A 07/02/2016   Procedure: Coronary Stent Intervention;  Surgeon: Nelva Bush, MD;  Location: [place] [facility];  Service: Cardiovascular;  Laterality: N/A;  . CATARACT EXTRACTION    . COLONOSCOPY    . CORONARY ANGIOPLASTY WITH STENT PLACEMENT  07/02/2016  . ESOPHAGOGASTRODUODENOSCOPY N/A 03/20/2016   Procedure: ESOPHAGOGASTRODUODENOSCOPY (EGD);  Surgeon: Milus Banister, MD;  Location: [place];  Service: Endoscopy;  Laterality: N/A;  . EYE SURGERY Bilateral    cataracts removed, left eye still has some oil in it.  . INSERTION OF DIALYSIS CATHETER N/A 09/03/2014   Procedure: INSERTION OF DIALYSIS CATHETER RIGHT INTERNAL JUGULAR VEIN;  Surgeon: Conrad [place], MD;  Location: [place];  Service: Vascular;  Laterality: N/A;  . PERIPHERAL VASCULAR CATHETERIZATION N/A 08/13/2015   Procedure: Abdominal Aortogram;  Surgeon: Elam Dutch, MD;  Location: [place] [facility];  Service: Cardiovascular;  Laterality: N/A;    reports that she has never smoked. She has never used smokeless tobacco. She reports that she does not drink alcohol and does not use drugs. family history includes Cancer in her mother. No Known Allergies Current Outpatient Medications on File Prior to Visit  Medication Sig Dispense Refill  . Accu-Chek Softclix Lancets lancets Use to check blood sugars 2x a day. DX: E11.9 100 each 12  . acetaminophen (TYLENOL) 500 MG tablet Take 500 mg by mouth every 6 (six) hours as needed for mild pain.    [name] amLODipine (NORVASC) 10 MG tablet Take 10 mg by mouth at bedtime.    [name] aspirin 81 MG EC tablet Take 81 mg by mouth daily.      . Blood Glucose Monitoring Suppl (ACCU-CHEK GUIDE ME) w/Device KIT USE DEVICE TO CHECK BLOOD SUGARS 2X A DAY. DX: E11.9 1 kit 0  . clopidogrel (PLAVIX) 75 MG tablet TAKE 1 TABLET BY MOUTH EVERY DAY 90 tablet 0  . gabapentin (NEURONTIN) 100 MG capsule TAKE 2  CAPSULES (200 MG TOTAL) BY MOUTH 3 (THREE) TIMES DAILY. 540 capsule 1  . glipiZIDE (GLUCOTROL XL) 10 MG 24 hr tablet TAKE 1 TABLET (10 MG TOTAL) BY MOUTH DAILY WITH BREAKFAST. 90 tablet 2  . glucose blood test strip Use to check Blood Sugars 2x a day. DX: E11.9 100 each 12  . Influenza Vac High-Dose Quad (FLUZONE HIGH-DOSE QUADRIVALENT IM)     . JANUVIA 100 MG tablet TAKE 1 TABLET BY MOUTH EVERY DAY 90 tablet 2  . multivitamin (RENA-VIT) TABS tablet TAKE 1 TABLET BY MOUTH EVERYDAY AT BEDTIME 90 tablet 2  . nebivolol (BYSTOLIC) 5 MG tablet Take 1 tablet (5 mg total) by mouth daily. 90 tablet 0  . ondansetron (ZOFRAN ODT) 4 MG disintegrating tablet Take 1 tablet (4 mg total) by mouth every 6 (six) hours as needed. 12 tablet 0  . pantoprazole (PROTONIX) 40 MG tablet TAKE 1 TABLET BY  MOUTH TWICE A DAY 180 tablet 2  . VELPHORO 500 MG chewable tablet      Current Facility-Administered Medications on File Prior to Visit  Medication Dose Route Frequency Provider Last Rate Last Admin  . triamcinolone acetonide (TRIESENCE) 40 MG/ML subtenons injection 4 mg  4 mg Intravitreal  Bernarda Caffey, MD   4 mg at 02/24/18 1122   Review of Systems All otherwise neg per pt     Objective:   Physical Exam BP 120/60 (BP Location: Left Arm, Patient Position: Sitting, Cuff Size: Large)   Pulse 69   Temp 98.8 F (37.1 C) (Oral)   Ht _0  (1.473 m)   Wt 132 lb (59.9 kg)   SpO2 96%   BMI 27.59 kg/m  VS noted,  Constitutional: Pt appears in NAD HENT: Head: NCAT.  Right Ear: External ear normal.  Left Ear: External ear normal.  Eyes: . Pupils are equal, round, and reactive to light. Conjunctivae and EOM are normal Nose: without d/c or deformity Neck: Neck supple. Gross normal ROM Cardiovascular: Normal rate and regular rhythm.   Pulmonary/Chest: Effort normal and breath sounds without rales or wheezing.  Abd:  Soft, NT, ND, + BS, no organomegaly Neurological: Pt is alert. At baseline orientation, motor  grossly intact Skin: s/p bilat BKA with prosthetics Psychiatric: Pt behavior is normal without agitation  All otherwise neg per pt Lab Results  Component Value Date   WBC 13.2 (H) 10/10/2019   HGB 10.4 (L) 10/10/2019   HCT 32.4 (L) 10/10/2019   PLT 236 10/10/2019   GLUCOSE 276 (H) 10/10/2019   CHOL 198 09/07/2019   TRIG 130.0 09/07/2019   HDL 41.80 09/07/2019   LDLDIRECT 86.0 04/06/2017   LDLCALC 130 (H) 09/07/2019   ALT 9 09/07/2019   AST 15 09/07/2019   NA 138 10/10/2019   K 3.6 10/10/2019   CL 92 (L) 10/10/2019   CREATININE 7.98 (H) 10/10/2019   BUN 34 (H) 10/10/2019   CO2 29 10/10/2019   TSH 1.76 09/07/2019   INR 1.06 06/30/2016   HGBA1C 7.7 (H) 09/07/2019   MICROALBUR 108.1 (H) 09/15/2016      Assessment & Plan:

## 2020-03-12 NOTE — Patient Instructions (Signed)
Please take all new medication as prescribed - the tramadol for pain (but we can only give one wk to start, so call in one wk if you need more), and generic crestor 20 mg for cholesterol and circulation  Please continue all other medications as before  We will sign any form for the new leg prosthetics when they send it  Please have the pharmacy call with any other refills you may need.  Please continue your efforts at being more active, low cholesterol diet, and weight control.  You are otherwise up to date with prevention measures today.  Please keep your appointments with your specialists as you may have planned  Please go to the XRAY Department in the first floor for the x-ray testing - for the lower back xray and CXR  Please go to the LAB at the blood drawing area for the tests to be done  Please make an Appointment to return in 6 months, or sooner if needed

## 2020-03-12 NOTE — Assessment & Plan Note (Signed)
For tramadol prn,  to f/u any worsening symptoms or concerns, for films today given last had some suggestion of possible compression fx

## 2020-03-12 NOTE — Assessment & Plan Note (Signed)
stable overall by history and exam, recent data reviewed with pt, and pt to continue medical treatment as before,  to f/u any worsening symptoms or concerns  

## 2020-03-12 NOTE — Assessment & Plan Note (Signed)
stable overall by history and exam, recent data reviewed with pt, and pt to continue medical treatment as before,  to f/u any worsening symptoms or concerns, for a1c with labs today

## 2020-03-12 NOTE — Assessment & Plan Note (Signed)
Needs new prosthetics as documented today

## 2020-03-12 NOTE — Assessment & Plan Note (Signed)
Uncontrolled, no willing to try crestor 2o qd, goal ldl < 70

## 2020-03-13 DIAGNOSIS — N2581 Secondary hyperparathyroidism of renal origin: Secondary | ICD-10-CM | POA: Diagnosis not present

## 2020-03-13 DIAGNOSIS — N186 End stage renal disease: Secondary | ICD-10-CM | POA: Diagnosis not present

## 2020-03-13 DIAGNOSIS — Z992 Dependence on renal dialysis: Secondary | ICD-10-CM | POA: Diagnosis not present

## 2020-03-13 LAB — BASIC METABOLIC PANEL WITH GFR
BUN/Creatinine Ratio: 4 (calc) — ABNORMAL LOW (ref 6–22)
BUN: 28 mg/dL — ABNORMAL HIGH (ref 7–25)
CO2: 33 mmol/L — ABNORMAL HIGH (ref 20–32)
Calcium: 8.7 mg/dL (ref 8.6–10.4)
Chloride: 92 mmol/L — ABNORMAL LOW (ref 98–110)
Creat: 6.64 mg/dL — ABNORMAL HIGH (ref 0.50–0.99)
Glucose, Bld: 148 mg/dL — ABNORMAL HIGH (ref 65–99)
Potassium: 4.6 mmol/L (ref 3.5–5.3)
Sodium: 137 mmol/L (ref 135–146)

## 2020-03-13 LAB — HEPATIC FUNCTION PANEL
AG Ratio: 1.4 (calc) (ref 1.0–2.5)
ALT: 8 U/L (ref 6–29)
AST: 16 U/L (ref 10–35)
Albumin: 4.2 g/dL (ref 3.6–5.1)
Alkaline phosphatase (APISO): 125 U/L (ref 37–153)
Bilirubin, Direct: 0.1 mg/dL (ref 0.0–0.2)
Globulin: 3 g/dL (calc) (ref 1.9–3.7)
Indirect Bilirubin: 0.3 mg/dL (calc) (ref 0.2–1.2)
Total Bilirubin: 0.4 mg/dL (ref 0.2–1.2)
Total Protein: 7.2 g/dL (ref 6.1–8.1)

## 2020-03-13 LAB — HEMOGLOBIN A1C
Hgb A1c MFr Bld: 6.7 % of total Hgb — ABNORMAL HIGH (ref <5.7)
Mean Plasma Glucose: 146 (calc)
eAG (mmol/L): 8.1 (calc)

## 2020-03-13 LAB — LIPID PANEL
Cholesterol: 216 mg/dL — ABNORMAL HIGH (ref <200)
HDL: 44 mg/dL — ABNORMAL LOW (ref >=50)
LDL Cholesterol (Calc): 138 mg/dL (calc) — ABNORMAL HIGH
Non-HDL Cholesterol (Calc): 172 mg/dL (calc) — ABNORMAL HIGH (ref <130)
Total CHOL/HDL Ratio: 4.9 (calc) (ref <5.0)
Triglycerides: 198 mg/dL — ABNORMAL HIGH (ref <150)

## 2020-03-14 ENCOUNTER — Encounter: Payer: Self-pay | Admitting: Internal Medicine

## 2020-03-14 ENCOUNTER — Other Ambulatory Visit: Payer: Self-pay

## 2020-03-14 ENCOUNTER — Ambulatory Visit (INDEPENDENT_AMBULATORY_CARE_PROVIDER_SITE_OTHER): Payer: Medicare PPO | Admitting: Ophthalmology

## 2020-03-14 ENCOUNTER — Other Ambulatory Visit: Payer: Self-pay | Admitting: Internal Medicine

## 2020-03-14 ENCOUNTER — Encounter (INDEPENDENT_AMBULATORY_CARE_PROVIDER_SITE_OTHER): Payer: Self-pay | Admitting: Ophthalmology

## 2020-03-14 DIAGNOSIS — Z961 Presence of intraocular lens: Secondary | ICD-10-CM | POA: Diagnosis not present

## 2020-03-14 DIAGNOSIS — E113511 Type 2 diabetes mellitus with proliferative diabetic retinopathy with macular edema, right eye: Secondary | ICD-10-CM | POA: Diagnosis not present

## 2020-03-14 DIAGNOSIS — H18519 Endothelial corneal dystrophy, unspecified eye: Secondary | ICD-10-CM

## 2020-03-14 DIAGNOSIS — H3581 Retinal edema: Secondary | ICD-10-CM

## 2020-03-14 DIAGNOSIS — E113552 Type 2 diabetes mellitus with stable proliferative diabetic retinopathy, left eye: Secondary | ICD-10-CM | POA: Diagnosis not present

## 2020-03-14 MED ORDER — AFLIBERCEPT 2MG/0.05ML IZ SOLN FOR KALEIDOSCOPE
2.0000 mg | INTRAVITREAL | Status: AC | PRN
  Administered 2020-03-14: 2 mg via INTRAVITREAL

## 2020-03-14 MED ORDER — ROSUVASTATIN CALCIUM 40 MG PO TABS: 40.0000 mg | ORAL_TABLET | Freq: Every day | ORAL | 3 refills | Status: DC

## 2020-03-15 DIAGNOSIS — N2581 Secondary hyperparathyroidism of renal origin: Secondary | ICD-10-CM | POA: Diagnosis not present

## 2020-03-15 DIAGNOSIS — Z992 Dependence on renal dialysis: Secondary | ICD-10-CM | POA: Diagnosis not present

## 2020-03-15 DIAGNOSIS — N186 End stage renal disease: Secondary | ICD-10-CM | POA: Diagnosis not present

## 2020-03-18 ENCOUNTER — Other Ambulatory Visit: Payer: Self-pay | Admitting: Internal Medicine

## 2020-03-18 DIAGNOSIS — N186 End stage renal disease: Secondary | ICD-10-CM | POA: Diagnosis not present

## 2020-03-18 DIAGNOSIS — Z992 Dependence on renal dialysis: Secondary | ICD-10-CM | POA: Diagnosis not present

## 2020-03-18 DIAGNOSIS — N2581 Secondary hyperparathyroidism of renal origin: Secondary | ICD-10-CM | POA: Diagnosis not present

## 2020-03-18 MED ORDER — ROSUVASTATIN CALCIUM 40 MG PO TABS: 40.0000 mg | ORAL_TABLET | Freq: Every day | ORAL | 3 refills | Status: DC

## 2020-03-19 ENCOUNTER — Other Ambulatory Visit: Payer: Self-pay | Admitting: Cardiovascular Disease

## 2020-03-20 DIAGNOSIS — Z992 Dependence on renal dialysis: Secondary | ICD-10-CM | POA: Diagnosis not present

## 2020-03-20 DIAGNOSIS — N186 End stage renal disease: Secondary | ICD-10-CM | POA: Diagnosis not present

## 2020-03-20 DIAGNOSIS — N2581 Secondary hyperparathyroidism of renal origin: Secondary | ICD-10-CM | POA: Diagnosis not present

## 2020-03-22 DIAGNOSIS — N2581 Secondary hyperparathyroidism of renal origin: Secondary | ICD-10-CM | POA: Diagnosis not present

## 2020-03-22 DIAGNOSIS — N186 End stage renal disease: Secondary | ICD-10-CM | POA: Diagnosis not present

## 2020-03-22 DIAGNOSIS — Z992 Dependence on renal dialysis: Secondary | ICD-10-CM | POA: Diagnosis not present

## 2020-03-25 DIAGNOSIS — N2581 Secondary hyperparathyroidism of renal origin: Secondary | ICD-10-CM | POA: Diagnosis not present

## 2020-03-25 DIAGNOSIS — Z992 Dependence on renal dialysis: Secondary | ICD-10-CM | POA: Diagnosis not present

## 2020-03-25 DIAGNOSIS — N186 End stage renal disease: Secondary | ICD-10-CM | POA: Diagnosis not present

## 2020-03-26 ENCOUNTER — Telehealth: Payer: Self-pay | Admitting: Internal Medicine

## 2020-03-26 NOTE — Telephone Encounter (Addendum)
   Patient currently at appointment with [facility] is needing last office visit note and prescription written for new prosthetics. Sherian Rein is going to email scheduler form that is needing completion  Fax [phone] Phone [phone] [facility]

## 2020-03-26 NOTE — Telephone Encounter (Signed)
Last office note was sent. However, fax was never received from the [employer].

## 2020-03-27 DIAGNOSIS — N2581 Secondary hyperparathyroidism of renal origin: Secondary | ICD-10-CM | POA: Diagnosis not present

## 2020-03-27 DIAGNOSIS — N186 End stage renal disease: Secondary | ICD-10-CM | POA: Diagnosis not present

## 2020-03-27 DIAGNOSIS — Z992 Dependence on renal dialysis: Secondary | ICD-10-CM | POA: Diagnosis not present

## 2020-03-28 DIAGNOSIS — T782XXA Anaphylactic shock, unspecified, initial encounter: Secondary | ICD-10-CM | POA: Insufficient documentation

## 2020-03-28 DIAGNOSIS — T7840XA Allergy, unspecified, initial encounter: Secondary | ICD-10-CM | POA: Insufficient documentation

## 2020-03-29 DIAGNOSIS — N2581 Secondary hyperparathyroidism of renal origin: Secondary | ICD-10-CM | POA: Diagnosis not present

## 2020-03-29 DIAGNOSIS — Z992 Dependence on renal dialysis: Secondary | ICD-10-CM | POA: Diagnosis not present

## 2020-03-29 DIAGNOSIS — N186 End stage renal disease: Secondary | ICD-10-CM | POA: Diagnosis not present

## 2020-03-30 DIAGNOSIS — N186 End stage renal disease: Secondary | ICD-10-CM | POA: Diagnosis not present

## 2020-03-30 DIAGNOSIS — E1129 Type 2 diabetes mellitus with other diabetic kidney complication: Secondary | ICD-10-CM | POA: Diagnosis not present

## 2020-03-30 DIAGNOSIS — Z992 Dependence on renal dialysis: Secondary | ICD-10-CM | POA: Diagnosis not present

## 2020-04-01 DIAGNOSIS — N2581 Secondary hyperparathyroidism of renal origin: Secondary | ICD-10-CM | POA: Diagnosis not present

## 2020-04-01 DIAGNOSIS — N186 End stage renal disease: Secondary | ICD-10-CM | POA: Diagnosis not present

## 2020-04-01 DIAGNOSIS — Z992 Dependence on renal dialysis: Secondary | ICD-10-CM | POA: Diagnosis not present

## 2020-04-03 DIAGNOSIS — N186 End stage renal disease: Secondary | ICD-10-CM | POA: Diagnosis not present

## 2020-04-03 DIAGNOSIS — N2581 Secondary hyperparathyroidism of renal origin: Secondary | ICD-10-CM | POA: Diagnosis not present

## 2020-04-03 DIAGNOSIS — Z992 Dependence on renal dialysis: Secondary | ICD-10-CM | POA: Diagnosis not present

## 2020-04-05 DIAGNOSIS — N2581 Secondary hyperparathyroidism of renal origin: Secondary | ICD-10-CM | POA: Diagnosis not present

## 2020-04-05 DIAGNOSIS — N186 End stage renal disease: Secondary | ICD-10-CM | POA: Diagnosis not present

## 2020-04-05 DIAGNOSIS — Z992 Dependence on renal dialysis: Secondary | ICD-10-CM | POA: Diagnosis not present

## 2020-04-08 DIAGNOSIS — N2581 Secondary hyperparathyroidism of renal origin: Secondary | ICD-10-CM | POA: Diagnosis not present

## 2020-04-08 DIAGNOSIS — Z992 Dependence on renal dialysis: Secondary | ICD-10-CM | POA: Diagnosis not present

## 2020-04-08 DIAGNOSIS — N186 End stage renal disease: Secondary | ICD-10-CM | POA: Diagnosis not present

## 2020-04-10 DIAGNOSIS — N186 End stage renal disease: Secondary | ICD-10-CM | POA: Diagnosis not present

## 2020-04-10 DIAGNOSIS — Z992 Dependence on renal dialysis: Secondary | ICD-10-CM | POA: Diagnosis not present

## 2020-04-10 DIAGNOSIS — N2581 Secondary hyperparathyroidism of renal origin: Secondary | ICD-10-CM | POA: Diagnosis not present

## 2020-04-12 DIAGNOSIS — N2581 Secondary hyperparathyroidism of renal origin: Secondary | ICD-10-CM | POA: Diagnosis not present

## 2020-04-12 DIAGNOSIS — Z992 Dependence on renal dialysis: Secondary | ICD-10-CM | POA: Diagnosis not present

## 2020-04-12 DIAGNOSIS — N186 End stage renal disease: Secondary | ICD-10-CM | POA: Diagnosis not present

## 2020-04-15 DIAGNOSIS — Z992 Dependence on renal dialysis: Secondary | ICD-10-CM | POA: Diagnosis not present

## 2020-04-15 DIAGNOSIS — N2581 Secondary hyperparathyroidism of renal origin: Secondary | ICD-10-CM | POA: Diagnosis not present

## 2020-04-15 DIAGNOSIS — N186 End stage renal disease: Secondary | ICD-10-CM | POA: Diagnosis not present

## 2020-04-17 DIAGNOSIS — Z992 Dependence on renal dialysis: Secondary | ICD-10-CM | POA: Diagnosis not present

## 2020-04-17 DIAGNOSIS — N2581 Secondary hyperparathyroidism of renal origin: Secondary | ICD-10-CM | POA: Diagnosis not present

## 2020-04-17 DIAGNOSIS — N186 End stage renal disease: Secondary | ICD-10-CM | POA: Diagnosis not present

## 2020-04-18 NOTE — Progress Notes (Signed)
[facility] Note  04/23/2020     CHIEF COMPLAINT Patient presents for Retina Follow Up   HISTORY OF PRESENT ILLNESS: Carolyn Valenzuela is a 69 y.o. female who presents to the clinic today for:   HPI    Retina Follow Up    Patient presents with  Diabetic Retinopathy.  In both eyes.  This started 6 weeks ago.  Severity is moderate.  I, the attending physician,  performed the HPI with the patient and updated documentation appropriately.          Comments    Patient here for 4 (6) weeks retina follow up for PDR OU. Patient states vision doing ok. No eye pain.        Last edited by Bernarda Caffey, MD on 04/23/2020 12:30 PM. (History)    Patient states vision may be a little better  Referring physician: Calton Dach, MD [place],  [place] [phone]  HISTORICAL INFORMATION:   Selected notes from the MEDICAL RECORD NUMBER Referred by Dr. Shirley Muscat for concern of DME OD and hard exudates OS    CURRENT MEDICATIONS: No current outpatient medications on file. (Ophthalmic Drugs)   Current Facility-Administered Medications (Ophthalmic Drugs)  Medication Route  . triamcinolone acetonide (TRIESENCE) 40 MG/ML subtenons injection 4 mg Intravitreal   Current Outpatient Medications (Other)  Medication Sig  . Accu-Chek Softclix Lancets lancets Use to check blood sugars 2x a day. DX: E11.9  . acetaminophen (TYLENOL) 500 MG tablet Take 500 mg by mouth every 6 (six) hours as needed for mild pain.  [name] amLODipine (NORVASC) 10 MG tablet Take 10 mg by mouth at bedtime.  [name] aspirin 81 MG EC tablet Take 81 mg by mouth daily.    . Blood Glucose Monitoring Suppl (ACCU-CHEK GUIDE ME) w/Device KIT USE DEVICE TO CHECK BLOOD SUGARS 2X A DAY. DX: E11.9  . clopidogrel (PLAVIX) 75 MG tablet TAKE 1 TABLET BY MOUTH EVERY DAY  . gabapentin (NEURONTIN) 100 MG capsule TAKE 2 CAPSULES (200 MG TOTAL) BY MOUTH 3 (THREE) TIMES DAILY.  [name] glipiZIDE (GLUCOTROL XL) 10 MG  24 hr tablet TAKE 1 TABLET (10 MG TOTAL) BY MOUTH DAILY WITH BREAKFAST.  [name] glucose blood test strip Use to check Blood Sugars 2x a day. DX: E11.9  . Influenza Vac High-Dose Quad (FLUZONE HIGH-DOSE QUADRIVALENT IM)   . JANUVIA 100 MG tablet TAKE 1 TABLET BY MOUTH EVERY DAY  . multivitamin (RENA-VIT) TABS tablet TAKE 1 TABLET BY MOUTH EVERYDAY AT BEDTIME  . nebivolol (BYSTOLIC) 5 MG tablet Take 1 tablet (5 mg total) by mouth daily.  . ondansetron (ZOFRAN ODT) 4 MG disintegrating tablet Take 1 tablet (4 mg total) by mouth every 6 (six) hours as needed.  . pantoprazole (PROTONIX) 40 MG tablet TAKE 1 TABLET BY MOUTH TWICE A DAY  . rosuvastatin (CRESTOR) 40 MG tablet Take 1 tablet (40 mg total) by mouth daily.  . traMADol (ULTRAM) 50 MG tablet Take 1 tablet (50 mg total) by mouth every 6 (six) hours as needed.  . VELPHORO 500 MG chewable tablet    No current facility-administered medications for this visit. (Other)      REVIEW OF SYSTEMS: ROS    Positive for: Endocrine, Cardiovascular, Eyes   Negative for: Constitutional, Gastrointestinal, Neurological, Skin, Genitourinary, Musculoskeletal, HENT, Respiratory, Psychiatric, Allergic/Imm, Heme/Lymph   Last edited by Theodore Demark, COA on 04/23/2020  9:04 AM. (History)       ALLERGIES No Known Allergies  PAST MEDICAL HISTORY Past Medical History:  Diagnosis Date  . Allergic rhinitis, cause unspecified 12/08/2010  . ANEMIA-IRON DEFICIENCY 01/25/2008  . Aortic regurgitation   . CAD (coronary artery disease)    a. 07/2016 NSTEMI/PCI: LM nl, LAD 70ost, 89md, 80d, D1 40ost, LCX 99ost/p (2.5x16 Synergy DES - 2.75), 40/619mOm2 100 CTO, RCA  30p/m. Pt eval by CT surgery, not felt to be suitable candidate 2/2 comorbidities.  . CERVICAL RADICULOPATHY, LEFT 01/25/2008  . Chronic systolic CHF (congestive heart failure) ([place]   a. 07/2016 Echo: EF 40-45%, diff HK, sev AI, mild MVP w/ mod to sev MR, PASP 5131m.  . Critical lower limb ischemia   .  DIABETES MELLITUS, UNCONTROLLED 05/21/2009  . Esophagitis   . [place] . Foot ulcer ([place]  right lateral malleolus  . HCAP (healthcare-associated pneumonia) 06/29/2016  . HYPERLIPIDEMIA 03/30/2007  . HYPERTENSION 01/25/2008  . Ischemic cardiomyopathy    a. 07/2016 Echo: EF 40-45%.  . Mitral regurgitation    a. 07/2016 Echo: Mild MVP w/ mod to sev MR.  . PMarland KitchenD (peripheral vascular disease) ([place]  a. s/p bilat BKA  . SECONDARY HYPERPARATHYROIDISM 05/21/2009  . Severe aortic regurgitation    a. 07/2016 Echo: Severe AI.   Past Surgical History:  Procedure Laterality Date  . AMPUTATION Right 03/15/2014   Procedure: RIGHT  LEG  BELOW KNEE AMPUTATION ;  Surgeon: JohWylene SimmerD;  [facility];  Laterality: Right;  . AMPUTATION Left 11/29/2015   Procedure: AMPUTATION BELOW KNEE;  Surgeon: MarNewt MinionD;  [facility];  Laterality: Left;  . AV FISTULA PLACEMENT Left   . AV FISTULA PLACEMENT Left 09/06/2014   Procedure: ARTERIOVENOUS (AV) FISTULA CREATION-Left Brachiocephalic;  Surgeon: BriConrad BurlingtonD;  [facility];  Laterality: Left;  . CARDIAC CATHETERIZATION N/A 06/30/2016   Procedure: Left Heart Cath and Coronary Angiography;  Surgeon: ChrBurnell BlanksD;  [facility] [place] [facility];  Service: Cardiovascular;  Laterality: N/A;  . CARDIAC CATHETERIZATION N/A 07/02/2016   Procedure: Coronary Stent Intervention;  Surgeon: ChrNelva BushD;  [facility] [place] [facility];  Service: Cardiovascular;  Laterality: N/A;  . CATARACT EXTRACTION    . COLONOSCOPY    . CORONARY ANGIOPLASTY WITH STENT PLACEMENT  07/02/2016  . ESOPHAGOGASTRODUODENOSCOPY N/A 03/20/2016   Procedure: ESOPHAGOGASTRODUODENOSCOPY (EGD);  Surgeon: DanMilus BanisterD;  [facility];  Laterality: N/A;  . EYE SURGERY Bilateral    cataracts removed, left eye still has some oil in it.  . INSERTION  OF DIALYSIS CATHETER N/A 09/03/2014   Procedure: INSERTION OF DIALYSIS CATHETER RIGHT INTERNAL JUGULAR VEIN;  Surgeon: BriConrad BurlingtonD;  [facility];  Laterality: N/A;  . PERIPHERAL VASCULAR CATHETERIZATION N/A 08/13/2015   Procedure: Abdominal Aortogram;  Surgeon: ChaElam DutchD;  [facility];  Service: Cardiovascular;  Laterality: N/A;    FAMILY HISTORY Family History  Problem Relation Age of Onset  . Cancer Mother        Pancreatic    SOCIAL HISTORY Social History   Tobacco Use  . Smoking status: Never Smoker  . Smokeless tobacco: Never Used  Vaping Use  . Vaping Use: Never used  Substance Use Topics  . Alcohol use: No    Alcohol/week: 0.0 standard drinks  . Drug use: No  OPHTHALMIC EXAM:  Base Eye Exam    Visual Acuity (Snellen - Linear)      Right Left   Dist cc 20/50 -2 20/40 -1   Dist ph cc 20/40 20/30 -1   Correction: Glasses       Tonometry (Tonopen, 8:58 AM)      Right Left   Pressure 10 10       Pupils      Dark Light Shape React APD   Right 2 1 Round Minimal None   Left 2 1 Round Minimal None       Visual Fields (Counting fingers)      Left Right    Full Full       Extraocular Movement      Right Left    Full, Ortho Full, Ortho       Neuro/Psych    Oriented x3: Yes   Mood/Affect: Normal       Dilation    Both eyes: 1.0% Mydriacyl, 2.5% Phenylephrine @ 8:58 AM        Slit Lamp and Fundus Exam    Slit Lamp Exam      Right Left   Lids/Lashes dermatochalasis dermatochalasis   Conjunctiva/Sclera White and quiet White and quiet   Cornea arcus; 1+ PEE; 2+ guttata arcus; 1-2+ guttae   Anterior Chamber Deep and quiet Deep and quiet   Iris Round, dilated; no NVI Round, dilated; large PI at 0600 no NVI   Lens PCIOL in good position, white, hazy opacity inferior IOL PCIOL in good position, open PC   Vitreous clear clear       Fundus Exam      Right Left   Disc Mild temp PPA, sharp  rim, mild pallor Mild Pallor, sharp rim   C/D Ratio 0.3 0.35   Macula Blunted foveal reflex with +edema -- slightly improved, Inferior Epiretinal membrane, scattered MA, laser scars encroaching into macula Blunted foveal reflex, scattered Exudates, Retinal pigment epithelial mottling, focal laser scars   Vessels Vascular attenuation, sclerotic arterioles--greatest nasal disc, mild tortuousity sclerotic arterioles, generalized attenuation   Periphery Attached, 360 PRP, scattered areas of fibrosis Attached, 360 PRP in place, areas of fibrosis, focal DBH nasal to disc        Refraction    Wearing Rx      Sphere Cylinder Axis Add   Right +0.25 +0.25 037 +2.50   Left [place] +0.75 175 +2.50          IMAGING AND PROCEDURES  Imaging and Procedures for 12/30/17  OCT, Retina - OU - Both Eyes       Right Eye Quality was good. Central Foveal Thickness: 335. Progression has improved. Findings include abnormal foveal contour, no SRF, intraretinal fluid, epiretinal membrane, intraretinal hyper-reflective material, outer retinal atrophy, inner retinal atrophy (Mild interval improvement in IRF).   Left Eye Quality was good. Central Foveal Thickness: 259. Progression has been stable. Findings include abnormal foveal contour, no IRF, no SRF, macular pucker, epiretinal membrane, outer retinal atrophy (Rare cystic changes caught on temporal widefield).   Notes *Images captured and stored on drive  Diagnosis / Impression:  OD: mild interval improvement in IRF OS: no DME; +ERM w/ some pucker and preretinal fibrosis--stable   Clinical management:  See below  Abbreviations: NFP - Normal foveal profile. CME - cystoid macular edema. PED - pigment epithelial detachment. IRF - intraretinal fluid. SRF - subretinal fluid. EZ - ellipsoid zone. ERM - epiretinal membrane. ORA - outer retinal atrophy. ORT -  outer retinal tubulation. SRHM - subretinal hyper-reflective material         Intravitreal  Injection, Pharmacologic Agent - OD - Right Eye       Time Out 04/23/2020. 9:59 AM. Confirmed correct patient, procedure, site, and patient consented.   Anesthesia Topical anesthesia was used. Anesthetic medications included Lidocaine 2%, Proparacaine 0.5%.   Procedure Preparation included 5% betadine to ocular surface, eyelid speculum. A (32g) needle was used.   Injection:  2 mg aflibercept Alfonse Flavors) SOLN   NDC: A3590391, Lot: [id number], Expiration date: 05/31/2020   Route: Intravitreal, Site: Right Eye, Waste: 0.05 mL  Post-op Post injection exam found visual acuity of at least counting fingers. The patient tolerated the procedure well. There were no complications. The patient received written and verbal post procedure care education.                 ASSESSMENT/PLAN:    ICD-10-CM   1. Proliferative diabetic retinopathy of right eye with macular edema associated with type 2 diabetes mellitus (HCC)  H70.2637 Intravitreal Injection, Pharmacologic Agent - OD - Right Eye    aflibercept (EYLEA) SOLN 2 mg  2. Retinal edema  H35.81 OCT, Retina - OU - Both Eyes  3. Stable proliferative diabetic retinopathy of left eye associated with type 2 diabetes mellitus ([place])  C58.8502   4. Pseudophakia of both eyes  Z96.1   5. Corneal guttata  H18.519     1,2,3. Proliferative diabetic retinopathy OU-  - lost to f/u since September 2019, but saw JDM on 05.11.21, pt received IVA OD #5 on that day  - s/p PPV OU with [facility] in 2014  - s/p PRP OU with [facility]  - S/P IVA OD #1 (02.26.19), #2 (04.02.19), #3 (05.02.19), #4 (05.30.19), #5 (05.11.21 JDM), #6 (06.08.21)  - S/P IVTA #1 OD (06.27.19) -- switched due to minimal improvement on IVA, #2 (08.13.19)   - s/p IVE OD #1 (09.24.19) - sample, #2 (07.15.21)  - exam shows good PRP 360 OU; sclerotic arterioles with sheathing OU; scattered DBH OU  - BCVA stable at 20/40-2 today  - OCT shows interval improvement in IRF OD  - recommend IVE OD #3  today, 08.24.21  - [facility] consent form obtained, signed, and scanned on 06.08.21  - Eylea informed consent form signed and scanned on 07.15.2021  - Eylea benefits paperwork signed on 09.24.19 -- pt is approved for Eylea for 2021  - f/u in 4 weeks -- DFE/OCT, possible injection  4. Pseudophakia OU  - s/p CE/IOL OU by Dr. Elliot Dally  - beautiful surgeries, doing well  - monitor  5. Corneal guttata OU  - no edema  - monitor  Ophthalmic Meds Ordered this visit:  Meds ordered this encounter  Medications  . aflibercept (EYLEA) SOLN 2 mg      Return in about 4 weeks (around 05/21/2020) for f/u PDR OU, DFE, OCT.  There are no Patient Instructions on file for this visit.  Explained the diagnoses, plan, and follow up with the patient and they expressed understanding.  Patient expressed understanding of the importance of proper follow up care.   This document serves as a record of services personally performed by Gardiner Sleeper, MD, PhD. It was created on their behalf by Estill Bakes, COT an ophthalmic technician. The creation of this record is the provider's dictation and/or activities during the visit.    Electronically signed by: Estill Bakes, COT 8.19.21 @ 12:32 PM   This document serves as a  record of services personally performed by Gardiner Sleeper, MD, PhD. It was created on their behalf by San Jetty. Owens Shark, OA an ophthalmic technician. The creation of this record is the provider's dictation and/or activities during the visit.    Electronically signed by: San Jetty. Marguerita Merles 08.24.2021 12:32 PM  Gardiner Sleeper, M.D., Ph.D. Diseases & Surgery of the Retina and [place] 04/23/2020   I have reviewed the above documentation for accuracy and completeness, and I agree with the above. Gardiner Sleeper, M.D., Ph.D. 04/23/20 12:32 PM   Abbreviations: M myopia (nearsighted); A astigmatism; H hyperopia (farsighted); P presbyopia; Mrx spectacle  prescription;  CTL contact lenses; OD right eye; OS left eye; OU both eyes  XT exotropia; ET esotropia; PEK punctate epithelial keratitis; PEE punctate epithelial erosions; DES dry eye syndrome; MGD meibomian gland dysfunction; ATs artificial tears; PFAT's preservative free artificial tears; [place] nuclear sclerotic cataract; PSC posterior subcapsular cataract; ERM epi-retinal membrane; PVD posterior vitreous detachment; RD retinal detachment; DM diabetes mellitus; DR diabetic retinopathy; NPDR non-proliferative diabetic retinopathy; PDR proliferative diabetic retinopathy; CSME clinically significant macular edema; DME diabetic macular edema; dbh dot blot hemorrhages; CWS cotton wool spot; POAG primary open angle glaucoma; C/D cup-to-disc ratio; HVF humphrey visual field; GVF goldmann visual field; OCT optical coherence tomography; IOP intraocular pressure; BRVO Branch retinal vein occlusion; CRVO central retinal vein occlusion; CRAO central retinal artery occlusion; BRAO branch retinal artery occlusion; RT retinal tear; SB scleral buckle; PPV pars plana vitrectomy; VH Vitreous hemorrhage; PRP panretinal laser photocoagulation; IVK intravitreal kenalog; VMT vitreomacular traction; MH Macular hole;  NVD neovascularization of the disc; NVE neovascularization elsewhere; AREDS age related eye disease study; ARMD age related macular degeneration; POAG primary open angle glaucoma; EBMD epithelial/anterior basement membrane dystrophy; ACIOL anterior chamber intraocular lens; IOL intraocular lens; PCIOL posterior chamber intraocular lens; Phaco/IOL phacoemulsification with intraocular lens placement; Elsa photorefractive keratectomy; LASIK laser assisted in situ keratomileusis; HTN hypertension; DM diabetes mellitus; COPD chronic obstructive pulmonary disease

## 2020-04-19 DIAGNOSIS — Z992 Dependence on renal dialysis: Secondary | ICD-10-CM | POA: Diagnosis not present

## 2020-04-19 DIAGNOSIS — N2581 Secondary hyperparathyroidism of renal origin: Secondary | ICD-10-CM | POA: Diagnosis not present

## 2020-04-19 DIAGNOSIS — N186 End stage renal disease: Secondary | ICD-10-CM | POA: Diagnosis not present

## 2020-04-22 DIAGNOSIS — Z1152 Encounter for screening for COVID-19: Secondary | ICD-10-CM | POA: Insufficient documentation

## 2020-04-22 DIAGNOSIS — Z992 Dependence on renal dialysis: Secondary | ICD-10-CM | POA: Diagnosis not present

## 2020-04-22 DIAGNOSIS — N186 End stage renal disease: Secondary | ICD-10-CM | POA: Diagnosis not present

## 2020-04-22 DIAGNOSIS — N2581 Secondary hyperparathyroidism of renal origin: Secondary | ICD-10-CM | POA: Diagnosis not present

## 2020-04-23 ENCOUNTER — Ambulatory Visit (INDEPENDENT_AMBULATORY_CARE_PROVIDER_SITE_OTHER): Payer: Medicare PPO | Admitting: Ophthalmology

## 2020-04-23 ENCOUNTER — Encounter (INDEPENDENT_AMBULATORY_CARE_PROVIDER_SITE_OTHER): Payer: Self-pay | Admitting: Ophthalmology

## 2020-04-23 ENCOUNTER — Other Ambulatory Visit: Payer: Self-pay

## 2020-04-23 ENCOUNTER — Encounter (INDEPENDENT_AMBULATORY_CARE_PROVIDER_SITE_OTHER): Payer: Medicare PPO | Admitting: Ophthalmology

## 2020-04-23 DIAGNOSIS — E113552 Type 2 diabetes mellitus with stable proliferative diabetic retinopathy, left eye: Secondary | ICD-10-CM | POA: Diagnosis not present

## 2020-04-23 DIAGNOSIS — E113511 Type 2 diabetes mellitus with proliferative diabetic retinopathy with macular edema, right eye: Secondary | ICD-10-CM | POA: Diagnosis not present

## 2020-04-23 DIAGNOSIS — H18519 Endothelial corneal dystrophy, unspecified eye: Secondary | ICD-10-CM

## 2020-04-23 DIAGNOSIS — Z961 Presence of intraocular lens: Secondary | ICD-10-CM

## 2020-04-23 DIAGNOSIS — H3581 Retinal edema: Secondary | ICD-10-CM | POA: Diagnosis not present

## 2020-04-23 MED ORDER — AFLIBERCEPT 2MG/0.05ML IZ SOLN FOR KALEIDOSCOPE
2.0000 mg | INTRAVITREAL | Status: AC | PRN
  Administered 2020-04-23: 2 mg via INTRAVITREAL

## 2020-04-24 DIAGNOSIS — N186 End stage renal disease: Secondary | ICD-10-CM | POA: Diagnosis not present

## 2020-04-24 DIAGNOSIS — Z992 Dependence on renal dialysis: Secondary | ICD-10-CM | POA: Diagnosis not present

## 2020-04-24 DIAGNOSIS — N2581 Secondary hyperparathyroidism of renal origin: Secondary | ICD-10-CM | POA: Diagnosis not present

## 2020-04-26 DIAGNOSIS — Z992 Dependence on renal dialysis: Secondary | ICD-10-CM | POA: Diagnosis not present

## 2020-04-26 DIAGNOSIS — N186 End stage renal disease: Secondary | ICD-10-CM | POA: Diagnosis not present

## 2020-04-26 DIAGNOSIS — N2581 Secondary hyperparathyroidism of renal origin: Secondary | ICD-10-CM | POA: Diagnosis not present

## 2020-04-29 ENCOUNTER — Other Ambulatory Visit: Payer: Self-pay | Admitting: Cardiovascular Disease

## 2020-04-29 DIAGNOSIS — N186 End stage renal disease: Secondary | ICD-10-CM | POA: Diagnosis not present

## 2020-04-29 DIAGNOSIS — N2581 Secondary hyperparathyroidism of renal origin: Secondary | ICD-10-CM | POA: Diagnosis not present

## 2020-04-29 DIAGNOSIS — Z992 Dependence on renal dialysis: Secondary | ICD-10-CM | POA: Diagnosis not present

## 2020-04-30 ENCOUNTER — Ambulatory Visit: Payer: Medicare PPO | Admitting: Cardiovascular Disease

## 2020-04-30 DIAGNOSIS — N186 End stage renal disease: Secondary | ICD-10-CM | POA: Diagnosis not present

## 2020-04-30 DIAGNOSIS — Z992 Dependence on renal dialysis: Secondary | ICD-10-CM | POA: Diagnosis not present

## 2020-04-30 DIAGNOSIS — E1129 Type 2 diabetes mellitus with other diabetic kidney complication: Secondary | ICD-10-CM | POA: Diagnosis not present

## 2020-05-01 DIAGNOSIS — N2581 Secondary hyperparathyroidism of renal origin: Secondary | ICD-10-CM | POA: Diagnosis not present

## 2020-05-01 DIAGNOSIS — Z992 Dependence on renal dialysis: Secondary | ICD-10-CM | POA: Diagnosis not present

## 2020-05-01 DIAGNOSIS — N186 End stage renal disease: Secondary | ICD-10-CM | POA: Diagnosis not present

## 2020-05-03 DIAGNOSIS — N2581 Secondary hyperparathyroidism of renal origin: Secondary | ICD-10-CM | POA: Diagnosis not present

## 2020-05-03 DIAGNOSIS — N186 End stage renal disease: Secondary | ICD-10-CM | POA: Diagnosis not present

## 2020-05-03 DIAGNOSIS — Z992 Dependence on renal dialysis: Secondary | ICD-10-CM | POA: Diagnosis not present

## 2020-05-06 DIAGNOSIS — Z992 Dependence on renal dialysis: Secondary | ICD-10-CM | POA: Diagnosis not present

## 2020-05-06 DIAGNOSIS — N2581 Secondary hyperparathyroidism of renal origin: Secondary | ICD-10-CM | POA: Diagnosis not present

## 2020-05-06 DIAGNOSIS — N186 End stage renal disease: Secondary | ICD-10-CM | POA: Diagnosis not present

## 2020-05-07 DIAGNOSIS — Z89512 Acquired absence of left leg below knee: Secondary | ICD-10-CM | POA: Diagnosis not present

## 2020-05-07 DIAGNOSIS — Z89511 Acquired absence of right leg below knee: Secondary | ICD-10-CM | POA: Diagnosis not present

## 2020-05-08 DIAGNOSIS — N2581 Secondary hyperparathyroidism of renal origin: Secondary | ICD-10-CM | POA: Diagnosis not present

## 2020-05-08 DIAGNOSIS — Z992 Dependence on renal dialysis: Secondary | ICD-10-CM | POA: Diagnosis not present

## 2020-05-08 DIAGNOSIS — N186 End stage renal disease: Secondary | ICD-10-CM | POA: Diagnosis not present

## 2020-05-10 DIAGNOSIS — Z992 Dependence on renal dialysis: Secondary | ICD-10-CM | POA: Diagnosis not present

## 2020-05-10 DIAGNOSIS — N186 End stage renal disease: Secondary | ICD-10-CM | POA: Diagnosis not present

## 2020-05-10 DIAGNOSIS — N2581 Secondary hyperparathyroidism of renal origin: Secondary | ICD-10-CM | POA: Diagnosis not present

## 2020-05-13 DIAGNOSIS — N2581 Secondary hyperparathyroidism of renal origin: Secondary | ICD-10-CM | POA: Diagnosis not present

## 2020-05-13 DIAGNOSIS — N186 End stage renal disease: Secondary | ICD-10-CM | POA: Diagnosis not present

## 2020-05-13 DIAGNOSIS — Z992 Dependence on renal dialysis: Secondary | ICD-10-CM | POA: Diagnosis not present

## 2020-05-15 DIAGNOSIS — N2581 Secondary hyperparathyroidism of renal origin: Secondary | ICD-10-CM | POA: Diagnosis not present

## 2020-05-15 DIAGNOSIS — Z992 Dependence on renal dialysis: Secondary | ICD-10-CM | POA: Diagnosis not present

## 2020-05-15 DIAGNOSIS — N186 End stage renal disease: Secondary | ICD-10-CM | POA: Diagnosis not present

## 2020-05-17 DIAGNOSIS — Z992 Dependence on renal dialysis: Secondary | ICD-10-CM | POA: Diagnosis not present

## 2020-05-17 DIAGNOSIS — N2581 Secondary hyperparathyroidism of renal origin: Secondary | ICD-10-CM | POA: Diagnosis not present

## 2020-05-17 DIAGNOSIS — N186 End stage renal disease: Secondary | ICD-10-CM | POA: Diagnosis not present

## 2020-05-20 DIAGNOSIS — N186 End stage renal disease: Secondary | ICD-10-CM | POA: Diagnosis not present

## 2020-05-20 DIAGNOSIS — N2581 Secondary hyperparathyroidism of renal origin: Secondary | ICD-10-CM | POA: Diagnosis not present

## 2020-05-20 DIAGNOSIS — Z992 Dependence on renal dialysis: Secondary | ICD-10-CM | POA: Diagnosis not present

## 2020-05-22 ENCOUNTER — Other Ambulatory Visit: Payer: Self-pay | Admitting: Cardiovascular Disease

## 2020-05-22 DIAGNOSIS — N186 End stage renal disease: Secondary | ICD-10-CM | POA: Diagnosis not present

## 2020-05-22 DIAGNOSIS — Z992 Dependence on renal dialysis: Secondary | ICD-10-CM | POA: Diagnosis not present

## 2020-05-22 DIAGNOSIS — N2581 Secondary hyperparathyroidism of renal origin: Secondary | ICD-10-CM | POA: Diagnosis not present

## 2020-05-23 ENCOUNTER — Encounter (INDEPENDENT_AMBULATORY_CARE_PROVIDER_SITE_OTHER): Payer: Medicare PPO | Admitting: Ophthalmology

## 2020-05-23 NOTE — Telephone Encounter (Signed)
Pt overdue for 2 month f/u hasn't been seen in over 12 months. Please contact pt for future appointment.

## 2020-05-24 DIAGNOSIS — N186 End stage renal disease: Secondary | ICD-10-CM | POA: Diagnosis not present

## 2020-05-24 DIAGNOSIS — Z992 Dependence on renal dialysis: Secondary | ICD-10-CM | POA: Diagnosis not present

## 2020-05-24 DIAGNOSIS — N2581 Secondary hyperparathyroidism of renal origin: Secondary | ICD-10-CM | POA: Diagnosis not present

## 2020-05-24 NOTE — Progress Notes (Signed)
[facility] Note  05/28/2020     CHIEF COMPLAINT Patient presents for Retina Follow Up   HISTORY OF PRESENT ILLNESS: Carolyn Valenzuela is a 69 y.o. female who presents to the clinic today for:   HPI    Retina Follow Up    Patient presents with  Diabetic Retinopathy.  In both eyes.  This started weeks ago.  Severity is moderate.  Duration of weeks.  Since onset it is gradually improving.  I, the attending physician,  performed the HPI with the patient and updated documentation appropriately.          Comments    Pt states vision is improving OU.  Patient denies eye pain or discomfort and denies any new or worsening floaters or fol OU.       Last edited by Bernarda Caffey, MD on 05/28/2020 12:31 PM. (History)    pt states last week she "ate bad", this morning her blood sugar was 64, but she ate breakfast and forgot to take her medication  Referring physician: Biagio Borg, MD [place],  [place] [address]  HISTORICAL INFORMATION:   Selected notes from the MEDICAL RECORD NUMBER Referred by Dr. Shirley Muscat for concern of DME OD and hard exudates OS    CURRENT MEDICATIONS: No current outpatient medications on file. (Ophthalmic Drugs)   Current Facility-Administered Medications (Ophthalmic Drugs)  Medication Route   triamcinolone acetonide (TRIESENCE) 40 MG/ML subtenons injection 4 mg Intravitreal   Current Outpatient Medications (Other)  Medication Sig   Accu-Chek Softclix Lancets lancets Use to check blood sugars 2x a day. DX: E11.9   acetaminophen (TYLENOL) 500 MG tablet Take 500 mg by mouth every 6 (six) hours as needed for mild pain.   amLODipine (NORVASC) 10 MG tablet Take 10 mg by mouth at bedtime.   aspirin 81 MG EC tablet Take 81 mg by mouth daily.     Blood Glucose Monitoring Suppl (ACCU-CHEK GUIDE ME) w/Device KIT USE DEVICE TO CHECK BLOOD SUGARS 2X A DAY. DX: P29.5   BYSTOLIC 5 MG tablet TAKE 1 TABLET BY MOUTH EVERY  DAY   clopidogrel (PLAVIX) 75 MG tablet TAKE 1 TABLET BY MOUTH EVERY DAY   gabapentin (NEURONTIN) 100 MG capsule TAKE 2 CAPSULES (200 MG TOTAL) BY MOUTH 3 (THREE) TIMES DAILY.   glipiZIDE (GLUCOTROL XL) 10 MG 24 hr tablet TAKE 1 TABLET (10 MG TOTAL) BY MOUTH DAILY WITH BREAKFAST.   glucose blood test strip Use to check Blood Sugars 2x a day. DX: E11.9   Influenza Vac High-Dose Quad (FLUZONE HIGH-DOSE QUADRIVALENT IM)    JANUVIA 100 MG tablet TAKE 1 TABLET BY MOUTH EVERY DAY   multivitamin (RENA-VIT) TABS tablet TAKE 1 TABLET BY MOUTH EVERYDAY AT BEDTIME   ondansetron (ZOFRAN ODT) 4 MG disintegrating tablet Take 1 tablet (4 mg total) by mouth every 6 (six) hours as needed.   pantoprazole (PROTONIX) 40 MG tablet TAKE 1 TABLET BY MOUTH TWICE A DAY   rosuvastatin (CRESTOR) 40 MG tablet Take 1 tablet (40 mg total) by mouth daily.   traMADol (ULTRAM) 50 MG tablet Take 1 tablet (50 mg total) by mouth every 6 (six) hours as needed.   VELPHORO 500 MG chewable tablet    No current facility-administered medications for this visit. (Other)      REVIEW OF SYSTEMS: ROS    Positive for: Endocrine, Cardiovascular, Eyes   Negative for: Constitutional, Gastrointestinal, Neurological, Skin, Genitourinary, Musculoskeletal, HENT, Respiratory, Psychiatric, Allergic/Imm,  Heme/Lymph   Last edited by Doneen Poisson on 05/28/2020 10:00 AM. (History)       ALLERGIES No Known Allergies  PAST MEDICAL HISTORY Past Medical History:  Diagnosis Date   Allergic rhinitis, cause unspecified 12/08/2010   ANEMIA-IRON DEFICIENCY 01/25/2008   Aortic regurgitation    CAD (coronary artery disease)    a. 07/2016 NSTEMI/PCI: LM nl, LAD 70ost, 44md, 80d, D1 40ost, LCX 99ost/p (2.5x16 Synergy DES - 2.75), 40/631mOm2 100 CTO, RCA  30p/m. Pt eval by CT surgery, not felt to be suitable candidate 2/2 comorbidities.   CERVICAL RADICULOPATHY, LEFT 12/30/59/0960 Chronic systolic CHF (congestive heart failure)  (HCL[place]   a. 07/2016 Echo: EF 40-45%, diff HK, sev AI, mild MVP w/ mod to sev MR, PASP 5170m.   Critical lower limb ischemia    DIABETES MELLITUS, UNCONTROLLED 05/21/2009   Esophagitis    [facility]  Foot ulcer ([place]  right lateral malleolus   HCAP (healthcare-associated pneumonia) 06/29/2016   HYPERLIPIDEMIA 03/30/2007   HYPERTENSION 01/25/2008   Ischemic cardiomyopathy    a. 07/2016 Echo: EF 40-45%.   Mitral regurgitation    a. 07/2016 Echo: Mild MVP w/ mod to sev MR.   PVD (peripheral vascular disease) ([place]  a. s/p bilat BKA   SECONDARY HYPERPARATHYROIDISM 05/21/2009   Severe aortic regurgitation    a. 07/2016 Echo: Severe AI.   Past Surgical History:  Procedure Laterality Date   AMPUTATION Right 03/15/2014   Procedure: RIGHT  LEG  BELOW KNEE AMPUTATION ;  Surgeon: JohWylene SimmerD;  [facility];  Laterality: Right;   AMPUTATION Left 11/29/2015   Procedure: AMPUTATION BELOW KNEE;  Surgeon: MarNewt MinionD;  [facility];  Laterality: Left;   AV FISTULA PLACEMENT Left    AV FISTULA PLACEMENT Left 09/06/2014   Procedure: ARTERIOVENOUS (AV) FISTULA CREATION-Left Brachiocephalic;  Surgeon: BriConrad BurlingtonD;  [facility];  Laterality: Left;   CARDIAC CATHETERIZATION N/A 06/30/2016   Procedure: Left Heart Cath and Coronary Angiography;  Surgeon: ChrBurnell BlanksD;  [facility] [place] [facility];  Service: Cardiovascular;  Laterality: N/A;   CARDIAC CATHETERIZATION N/A 07/02/2016   Procedure: Coronary Stent Intervention;  Surgeon: ChrNelva BushD;  [facility] [place] [facility];  Service: Cardiovascular;  Laterality: N/A;   CATARACT EXTRACTION     COLONOSCOPY     CORONARY ANGIOPLASTY WITH STENT PLACEMENT  07/02/2016   ESOPHAGOGASTRODUODENOSCOPY N/A 03/20/2016   Procedure: ESOPHAGOGASTRODUODENOSCOPY (EGD);  Surgeon: DanMilus BanisterD;  [facility] [place] Service: Endoscopy;  Laterality: N/A;   EYE SURGERY Bilateral    cataracts removed, left eye still has some oil in it.   INSERTION OF DIALYSIS CATHETER N/A 09/03/2014   Procedure: INSERTION OF DIALYSIS CATHETER RIGHT INTERNAL JUGULAR VEIN;  Surgeon: BriConrad BurlingtonD;  [facility];  Laterality: N/A;   PERIPHERAL VASCULAR CATHETERIZATION N/A 08/13/2015   Procedure: Abdominal Aortogram;  Surgeon: ChaElam DutchD;  [facility];  Service: Cardiovascular;  Laterality: N/A;    FAMILY HISTORY Family History  Problem Relation Age of Onset   Cancer Mother        Pancreatic    SOCIAL HISTORY Social History   Tobacco Use   Smoking status: Never Smoker   Smokeless tobacco: Never Used  Vaping Use   Vaping Use: Never used  Substance Use Topics   Alcohol use: No    Alcohol/week: 0.0 standard drinks   Drug use: No         OPHTHALMIC EXAM:  Base Eye Exam    Visual Acuity (Snellen - Linear)      Right Left   Dist cc 20/60 +2 20/50 -1   Dist ph cc 20/40 -2 20/30 -2   Correction: Glasses       Tonometry (Tonopen, 10:05 AM)      Right Left   Pressure 16 18       Pupils      Dark Light Shape React APD   Right 2 1 Round Minimal 0   Left 2 1 Round Minimal 0       Visual Fields      Left Right    Full Full       Extraocular Movement      Right Left    Full Full       Neuro/Psych    Oriented x3: Yes   Mood/Affect: Normal       Dilation    Both eyes: 1.0% Mydriacyl, 2.5% Phenylephrine @ 10:05 AM        Slit Lamp and Fundus Exam    Slit Lamp Exam      Right Left   Lids/Lashes dermatochalasis dermatochalasis   Conjunctiva/Sclera White and quiet White and quiet   Cornea arcus; 1+ PEE; 2+ guttata arcus; 1-2+ guttae   Anterior Chamber Deep and quiet Deep and quiet   Iris Round, dilated; no NVI Round, dilated; large PI at 0600 no NVI   Lens PCIOL in good position, white, hazy opacity inferior IOL PCIOL in good  position, open PC   Vitreous clear clear       Fundus Exam      Right Left   Disc Mild temp PPA, sharp rim, mild pallor Mild Pallor, sharp rim, Compact   C/D Ratio 0.3 0.35   Macula Blunted foveal reflex with +edema / cystic changes -- slightly improved, Inferior Epiretinal membrane, scattered MA, laser scars encroaching into macula Blunted foveal reflex, scattered focal Exudates, Retinal pigment epithelial mottling, focal laser scars   Vessels Vascular attenuation, sclerotic arterioles--greatest nasal disc, mild tortuousity sclerotic arterioles, generalized attenuation   Periphery Attached, dense 360 PRP, scattered areas of fibrosis Attached, 360 PRP in place, areas of fibrosis, focal DBH nasal to disc        Refraction    Wearing Rx      Sphere Cylinder Axis Add   Right +0.25 +0.25 037 +2.50   Left [place] +0.75 175 +2.50          IMAGING AND PROCEDURES  Imaging and Procedures for 12/30/17  OCT, Retina - OU - Both Eyes       Right Eye Quality was good. Central Foveal Thickness: 328. Progression has improved. Findings include abnormal foveal contour, no SRF, intraretinal fluid, epiretinal membrane, intraretinal hyper-reflective material, outer retinal atrophy, inner retinal atrophy (Mild interval improvement in IRF/central cysts).   Left Eye Quality was good. Central Foveal Thickness: 262. Progression has been stable. Findings include abnormal foveal contour, no IRF, no SRF, macular pucker, epiretinal membrane, outer retinal atrophy (Rare cystic changes caught on temporal widefield).   Notes *Images captured and stored on drive  Diagnosis / Impression:  OD: mild interval improvement in IRF/central cysts OS: no DME; +ERM w/ some pucker and preretinal fibrosis--stable   Clinical management:  See below  Abbreviations: NFP - Normal foveal  profile. CME - cystoid macular edema. PED - pigment epithelial detachment. IRF - intraretinal fluid. SRF - subretinal fluid. EZ -  ellipsoid zone. ERM - epiretinal membrane. ORA - outer retinal atrophy. ORT - outer retinal tubulation. SRHM - subretinal hyper-reflective material         Intravitreal Injection, Pharmacologic Agent - OD - Right Eye       Time Out 05/28/2020. 11:08 AM. Confirmed correct patient, procedure, site, and patient consented.   Anesthesia Topical anesthesia was used. Anesthetic medications included Lidocaine 2%, Proparacaine 0.5%.   Procedure Preparation included 5% betadine to ocular surface, eyelid speculum. A (32g) needle was used.   Injection:  2 mg aflibercept Alfonse Flavors) SOLN   NDC: [id number], Lot: [id number], Expiration date: 07/31/2020   Route: Intravitreal, Site: Right Eye, Waste: 0.05 mL  Post-op Post injection exam found visual acuity of at least counting fingers. The patient tolerated the procedure well. There were no complications. The patient received written and verbal post procedure care education. Post injection medications were not given.                 ASSESSMENT/PLAN:    ICD-10-CM   1. Proliferative diabetic retinopathy of right eye with macular edema associated with type 2 diabetes mellitus (HCC)  D17.6160 Intravitreal Injection, Pharmacologic Agent - OD - Right Eye    aflibercept (EYLEA) SOLN 2 mg  2. Stable proliferative diabetic retinopathy of left eye associated with type 2 diabetes mellitus ([place])  V37.1062   3. Retinal edema  H35.81 OCT, Retina - OU - Both Eyes  4. Pseudophakia of both eyes  Z96.1   5. Corneal guttata  H18.519     1,2,3. Proliferative diabetic retinopathy OU-  - lost to f/u since September 2019, but saw JDM on 05.11.21, pt received IVA OD #5 on that day  - s/p PPV OU with [facility] in 2014  - s/p PRP OU with [facility] -- extensive PRP with extension into macula in some parts  - S/P IVA OD #1 (02.26.19), #2 (04.02.19), #3 (05.02.19), #4 (05.30.19), #5 (05.11.21 JDM), #6 (06.08.21)  - S/P IVTA #1 OD (06.27.19) -- switched due to minimal  improvement on IVA, #2 (08.13.19)   - s/p IVE OD #1 (09.24.19) - sample, #2 (07.15.21), # 3(8.24.21)  - exam shows good PRP 360 OU; sclerotic arterioles with sheathing OU; scattered DBH OU  - BCVA stable at 20/40-2 today  - OCT shows interval improvement in IRF OD  - recommend IVE OD #4 today, 9.28.21  - [place] consent form obtained, signed, and scanned on 06.08.21  - Eylea informed consent form signed and scanned on 07.15.2021  - Eylea benefits paperwork signed on 09.24.19 -- pt is approved for Eylea for 2021  - f/u in 4-5 weeks -- DFE/OCT, possible injection  4. Pseudophakia OU  - s/p CE/IOL OU by Dr. Elliot Dally  - beautiful surgeries, doing well  - monitor  5. Corneal guttata OU  - no edema  - monitor  Ophthalmic Meds Ordered this visit:  Meds ordered this encounter  Medications   aflibercept (EYLEA) SOLN 2 mg      Return for f/u 4-5 weeks, PDR OU, DFE, OCT.  There are no Patient Instructions on file for this visit.  Explained the diagnoses, plan, and follow up with the patient and they expressed understanding.  Patient expressed understanding of the importance of proper follow up care.   This document serves as a record of services personally performed by Gardiner Sleeper, MD,  PhD. It was created on their behalf by Estill Bakes, [place] an ophthalmic technician. The creation of this record is the provider's dictation and/or activities during the visit.    Electronically signed by: Estill Bakes, COT 9.24.21 @ 12:32 PM   This document serves as a record of services personally performed by Gardiner Sleeper, MD, PhD. It was created on their behalf by San Jetty. Owens Shark, OA an ophthalmic technician. The creation of this record is the provider's dictation and/or activities during the visit.    Electronically signed by: San Jetty. Marguerita Merles 09.28.2021 12:32 PM  Gardiner Sleeper, M.D., Ph.D. Diseases & Surgery of the Retina and [place] 05/28/2020    I have reviewed the above documentation for accuracy and completeness, and I agree with the above. Gardiner Sleeper, M.D., Ph.D. 05/28/20 12:33 PM   Abbreviations: M myopia (nearsighted); A astigmatism; H hyperopia (farsighted); P presbyopia; Mrx spectacle prescription;  CTL contact lenses; OD right eye; OS left eye; OU both eyes  XT exotropia; ET esotropia; PEK punctate epithelial keratitis; PEE punctate epithelial erosions; DES dry eye syndrome; MGD meibomian gland dysfunction; ATs artificial tears; PFAT's preservative free artificial tears; [place] nuclear sclerotic cataract; PSC posterior subcapsular cataract; ERM epi-retinal membrane; PVD posterior vitreous detachment; RD retinal detachment; DM diabetes mellitus; DR diabetic retinopathy; NPDR non-proliferative diabetic retinopathy; PDR proliferative diabetic retinopathy; CSME clinically significant macular edema; DME diabetic macular edema; dbh dot blot hemorrhages; CWS cotton wool spot; POAG primary open angle glaucoma; C/D cup-to-disc ratio; HVF humphrey visual field; GVF goldmann visual field; OCT optical coherence tomography; IOP intraocular pressure; BRVO Branch retinal vein occlusion; CRVO central retinal vein occlusion; CRAO central retinal artery occlusion; BRAO branch retinal artery occlusion; RT retinal tear; SB scleral buckle; PPV pars plana vitrectomy; VH Vitreous hemorrhage; PRP panretinal laser photocoagulation; IVK intravitreal kenalog; VMT vitreomacular traction; MH Macular hole;  NVD neovascularization of the disc; NVE neovascularization elsewhere; AREDS age related eye disease study; ARMD age related macular degeneration; POAG primary open angle glaucoma; EBMD epithelial/anterior basement membrane dystrophy; ACIOL anterior chamber intraocular lens; IOL intraocular lens; PCIOL posterior chamber intraocular lens; Phaco/IOL phacoemulsification with intraocular lens placement; [place] photorefractive keratectomy; LASIK laser assisted in situ  keratomileusis; HTN hypertension; DM diabetes mellitus; COPD chronic obstructive pulmonary disease

## 2020-05-27 DIAGNOSIS — Z992 Dependence on renal dialysis: Secondary | ICD-10-CM | POA: Diagnosis not present

## 2020-05-27 DIAGNOSIS — N2581 Secondary hyperparathyroidism of renal origin: Secondary | ICD-10-CM | POA: Diagnosis not present

## 2020-05-27 DIAGNOSIS — N186 End stage renal disease: Secondary | ICD-10-CM | POA: Diagnosis not present

## 2020-05-28 ENCOUNTER — Encounter (INDEPENDENT_AMBULATORY_CARE_PROVIDER_SITE_OTHER): Payer: Self-pay | Admitting: Ophthalmology

## 2020-05-28 ENCOUNTER — Ambulatory Visit (INDEPENDENT_AMBULATORY_CARE_PROVIDER_SITE_OTHER): Payer: Medicare PPO | Admitting: Ophthalmology

## 2020-05-28 ENCOUNTER — Other Ambulatory Visit: Payer: Self-pay | Admitting: Cardiovascular Disease

## 2020-05-28 ENCOUNTER — Other Ambulatory Visit: Payer: Self-pay

## 2020-05-28 DIAGNOSIS — H18519 Endothelial corneal dystrophy, unspecified eye: Secondary | ICD-10-CM

## 2020-05-28 DIAGNOSIS — Z961 Presence of intraocular lens: Secondary | ICD-10-CM | POA: Diagnosis not present

## 2020-05-28 DIAGNOSIS — E113552 Type 2 diabetes mellitus with stable proliferative diabetic retinopathy, left eye: Secondary | ICD-10-CM | POA: Diagnosis not present

## 2020-05-28 DIAGNOSIS — H3581 Retinal edema: Secondary | ICD-10-CM | POA: Diagnosis not present

## 2020-05-28 DIAGNOSIS — E113511 Type 2 diabetes mellitus with proliferative diabetic retinopathy with macular edema, right eye: Secondary | ICD-10-CM

## 2020-05-28 MED ORDER — AFLIBERCEPT 2MG/0.05ML IZ SOLN FOR KALEIDOSCOPE
2.0000 mg | INTRAVITREAL | Status: AC | PRN
  Administered 2020-05-28: 2 mg via INTRAVITREAL

## 2020-05-29 DIAGNOSIS — Z992 Dependence on renal dialysis: Secondary | ICD-10-CM | POA: Diagnosis not present

## 2020-05-29 DIAGNOSIS — N2581 Secondary hyperparathyroidism of renal origin: Secondary | ICD-10-CM | POA: Diagnosis not present

## 2020-05-29 DIAGNOSIS — N186 End stage renal disease: Secondary | ICD-10-CM | POA: Diagnosis not present

## 2020-05-30 ENCOUNTER — Telehealth: Payer: Self-pay | Admitting: Internal Medicine

## 2020-05-30 DIAGNOSIS — N186 End stage renal disease: Secondary | ICD-10-CM | POA: Diagnosis not present

## 2020-05-30 DIAGNOSIS — E1129 Type 2 diabetes mellitus with other diabetic kidney complication: Secondary | ICD-10-CM | POA: Diagnosis not present

## 2020-05-30 DIAGNOSIS — Z992 Dependence on renal dialysis: Secondary | ICD-10-CM | POA: Diagnosis not present

## 2020-05-30 NOTE — Telephone Encounter (Signed)
BYSTOLIC 5 MG table  [id number] [place], [place] - [address] Phone:  [phone]  Fax:  [phone]     Med should be 10 mg not 5 mg, according to the patient  Need primary to adjust meds

## 2020-05-31 DIAGNOSIS — Z992 Dependence on renal dialysis: Secondary | ICD-10-CM | POA: Diagnosis not present

## 2020-05-31 DIAGNOSIS — N186 End stage renal disease: Secondary | ICD-10-CM | POA: Diagnosis not present

## 2020-05-31 DIAGNOSIS — N2581 Secondary hyperparathyroidism of renal origin: Secondary | ICD-10-CM | POA: Diagnosis not present

## 2020-06-03 DIAGNOSIS — N186 End stage renal disease: Secondary | ICD-10-CM | POA: Diagnosis not present

## 2020-06-03 DIAGNOSIS — N2581 Secondary hyperparathyroidism of renal origin: Secondary | ICD-10-CM | POA: Diagnosis not present

## 2020-06-03 DIAGNOSIS — Z992 Dependence on renal dialysis: Secondary | ICD-10-CM | POA: Diagnosis not present

## 2020-06-03 NOTE — Telephone Encounter (Signed)
I dont see this on her chart about who or when this was done, so I cannot do this

## 2020-06-03 NOTE — Telephone Encounter (Signed)
Sent to Dr. John to advise. 

## 2020-06-05 DIAGNOSIS — Z992 Dependence on renal dialysis: Secondary | ICD-10-CM | POA: Diagnosis not present

## 2020-06-05 DIAGNOSIS — N2581 Secondary hyperparathyroidism of renal origin: Secondary | ICD-10-CM | POA: Diagnosis not present

## 2020-06-05 DIAGNOSIS — N186 End stage renal disease: Secondary | ICD-10-CM | POA: Diagnosis not present

## 2020-06-07 DIAGNOSIS — N2581 Secondary hyperparathyroidism of renal origin: Secondary | ICD-10-CM | POA: Diagnosis not present

## 2020-06-07 DIAGNOSIS — N186 End stage renal disease: Secondary | ICD-10-CM | POA: Diagnosis not present

## 2020-06-07 DIAGNOSIS — Z992 Dependence on renal dialysis: Secondary | ICD-10-CM | POA: Diagnosis not present

## 2020-06-10 DIAGNOSIS — N2581 Secondary hyperparathyroidism of renal origin: Secondary | ICD-10-CM | POA: Diagnosis not present

## 2020-06-10 DIAGNOSIS — N186 End stage renal disease: Secondary | ICD-10-CM | POA: Diagnosis not present

## 2020-06-10 DIAGNOSIS — Z992 Dependence on renal dialysis: Secondary | ICD-10-CM | POA: Diagnosis not present

## 2020-06-12 DIAGNOSIS — N2581 Secondary hyperparathyroidism of renal origin: Secondary | ICD-10-CM | POA: Diagnosis not present

## 2020-06-12 DIAGNOSIS — N186 End stage renal disease: Secondary | ICD-10-CM | POA: Diagnosis not present

## 2020-06-12 DIAGNOSIS — Z992 Dependence on renal dialysis: Secondary | ICD-10-CM | POA: Diagnosis not present

## 2020-06-13 ENCOUNTER — Other Ambulatory Visit: Payer: Self-pay | Admitting: Cardiovascular Disease

## 2020-06-14 DIAGNOSIS — Z992 Dependence on renal dialysis: Secondary | ICD-10-CM | POA: Diagnosis not present

## 2020-06-14 DIAGNOSIS — N2581 Secondary hyperparathyroidism of renal origin: Secondary | ICD-10-CM | POA: Diagnosis not present

## 2020-06-14 DIAGNOSIS — N186 End stage renal disease: Secondary | ICD-10-CM | POA: Diagnosis not present

## 2020-06-17 ENCOUNTER — Telehealth: Payer: Self-pay | Admitting: Cardiovascular Disease

## 2020-06-17 DIAGNOSIS — N186 End stage renal disease: Secondary | ICD-10-CM | POA: Diagnosis not present

## 2020-06-17 DIAGNOSIS — Z992 Dependence on renal dialysis: Secondary | ICD-10-CM | POA: Diagnosis not present

## 2020-06-17 DIAGNOSIS — N2581 Secondary hyperparathyroidism of renal origin: Secondary | ICD-10-CM | POA: Diagnosis not present

## 2020-06-17 NOTE — Telephone Encounter (Signed)
Patient's sister states patient's kidney doctor would like to decrease patient's Bystolic from 10 MG to 5 MG. Sister would like to ensure that Dr. Gwenlyn Found approves of this change. Please advise.

## 2020-06-18 NOTE — Telephone Encounter (Signed)
Fine with me

## 2020-06-19 DIAGNOSIS — N186 End stage renal disease: Secondary | ICD-10-CM | POA: Diagnosis not present

## 2020-06-19 DIAGNOSIS — Z992 Dependence on renal dialysis: Secondary | ICD-10-CM | POA: Diagnosis not present

## 2020-06-19 DIAGNOSIS — N2581 Secondary hyperparathyroidism of renal origin: Secondary | ICD-10-CM | POA: Diagnosis not present

## 2020-06-19 NOTE — Telephone Encounter (Signed)
Attempted to call patient and her sister back, no answer and unable to LM due to voicemail not being set up yet. Will forward to NL triage for follow up.

## 2020-06-20 NOTE — Telephone Encounter (Signed)
Attempted to call patient's sister at number provided and the number on file for the patient.  Unable to leave VM on either number.  Will try again later.

## 2020-06-21 DIAGNOSIS — N2581 Secondary hyperparathyroidism of renal origin: Secondary | ICD-10-CM | POA: Diagnosis not present

## 2020-06-21 DIAGNOSIS — Z992 Dependence on renal dialysis: Secondary | ICD-10-CM | POA: Diagnosis not present

## 2020-06-21 DIAGNOSIS — N186 End stage renal disease: Secondary | ICD-10-CM | POA: Diagnosis not present

## 2020-06-21 MED ORDER — NEBIVOLOL HCL 5 MG PO TABS
5.0000 mg | ORAL_TABLET | Freq: Every day | ORAL | 2 refills | Status: DC
Start: 2020-06-21 — End: 2020-09-12

## 2020-06-21 NOTE — Telephone Encounter (Signed)
Spoke with patient sister, okay per DPR. Made her aware Dr. Gwenlyn Found okay with medication change. No additional questions at this time. Sent updated Rx to preferred pharmacy. No additional questions at this time.

## 2020-06-24 DIAGNOSIS — Z992 Dependence on renal dialysis: Secondary | ICD-10-CM | POA: Diagnosis not present

## 2020-06-24 DIAGNOSIS — N186 End stage renal disease: Secondary | ICD-10-CM | POA: Diagnosis not present

## 2020-06-24 DIAGNOSIS — N2581 Secondary hyperparathyroidism of renal origin: Secondary | ICD-10-CM | POA: Diagnosis not present

## 2020-06-26 DIAGNOSIS — N186 End stage renal disease: Secondary | ICD-10-CM | POA: Diagnosis not present

## 2020-06-26 DIAGNOSIS — N2581 Secondary hyperparathyroidism of renal origin: Secondary | ICD-10-CM | POA: Diagnosis not present

## 2020-06-26 DIAGNOSIS — Z992 Dependence on renal dialysis: Secondary | ICD-10-CM | POA: Diagnosis not present

## 2020-06-27 NOTE — Progress Notes (Signed)
[facility] Note  07/02/2020     CHIEF COMPLAINT Patient presents for Retina Follow Up   HISTORY OF PRESENT ILLNESS: Carolyn Valenzuela is a 69 y.o. female who presents to the clinic today for:   HPI    Retina Follow Up    Patient presents with  Diabetic Retinopathy.  In both eyes.  Severity is moderate.  Duration of 4 weeks.  Since onset it is stable.  I, the attending physician,  performed the HPI with the patient and updated documentation appropriately.          Comments    Patient states vision the same OU. BS was 95 this am. Last a1c was 6.7 on 07.14.21.         Last edited by Bernarda Caffey, MD on 07/02/2020 10:22 AM. (History)    pt feels like her vision is better, she states she feels like something is in the corner of her right eye  Referring physician: Biagio Borg, MD [place],  [place] [id number]  HISTORICAL INFORMATION:   Selected notes from the MEDICAL RECORD NUMBER Referred by Dr. Shirley Muscat for concern of DME OD and hard exudates OS   CURRENT MEDICATIONS: No current outpatient medications on file. (Ophthalmic Drugs)   Current Facility-Administered Medications (Ophthalmic Drugs)  Medication Route  . triamcinolone acetonide (TRIESENCE) 40 MG/ML subtenons injection 4 mg Intravitreal   Current Outpatient Medications (Other)  Medication Sig  . Accu-Chek Softclix Lancets lancets Use to check blood sugars 2x a day. DX: E11.9  . acetaminophen (TYLENOL) 500 MG tablet Take 500 mg by mouth every 6 (six) hours as needed for mild pain.  [name] amLODipine (NORVASC) 10 MG tablet Take 10 mg by mouth at bedtime.  [name] aspirin 81 MG EC tablet Take 81 mg by mouth daily.    . Blood Glucose Monitoring Suppl (ACCU-CHEK GUIDE ME) w/Device KIT USE DEVICE TO CHECK BLOOD SUGARS 2X A DAY. DX: E11.9  . clopidogrel (PLAVIX) 75 MG tablet TAKE 1 TABLET BY MOUTH EVERY DAY  . gabapentin (NEURONTIN) 100 MG capsule TAKE 2 CAPSULES (200 MG TOTAL) BY MOUTH 3  (THREE) TIMES DAILY.  [name] glipiZIDE (GLUCOTROL XL) 10 MG 24 hr tablet TAKE 1 TABLET (10 MG TOTAL) BY MOUTH DAILY WITH BREAKFAST.  [name] glucose blood test strip Use to check Blood Sugars 2x a day. DX: E11.9  . Influenza Vac High-Dose Quad (FLUZONE HIGH-DOSE QUADRIVALENT IM)   . JANUVIA 100 MG tablet TAKE 1 TABLET BY MOUTH EVERY DAY  . multivitamin (RENA-VIT) TABS tablet TAKE 1 TABLET BY MOUTH EVERYDAY AT BEDTIME  . nebivolol (BYSTOLIC) 5 MG tablet Take 1 tablet (5 mg total) by mouth daily.  . ondansetron (ZOFRAN ODT) 4 MG disintegrating tablet Take 1 tablet (4 mg total) by mouth every 6 (six) hours as needed.  . pantoprazole (PROTONIX) 40 MG tablet TAKE 1 TABLET BY MOUTH TWICE A DAY  . rosuvastatin (CRESTOR) 40 MG tablet Take 1 tablet (40 mg total) by mouth daily.  . traMADol (ULTRAM) 50 MG tablet Take 1 tablet (50 mg total) by mouth every 6 (six) hours as needed.  . VELPHORO 500 MG chewable tablet    No current facility-administered medications for this visit. (Other)      REVIEW OF SYSTEMS: ROS    Positive for: Endocrine, Cardiovascular, Eyes   Negative for: Constitutional, Gastrointestinal, Neurological, Skin, Genitourinary, Musculoskeletal, HENT, Respiratory, Psychiatric, Allergic/Imm, Heme/Lymph   Last edited by Jobe Marker, COT  on 07/02/2020  9:44 AM. (History)       ALLERGIES No Known Allergies  PAST MEDICAL HISTORY Past Medical History:  Diagnosis Date  . Allergic rhinitis, cause unspecified 12/08/2010  . ANEMIA-IRON DEFICIENCY 01/25/2008  . Aortic regurgitation   . CAD (coronary artery disease)    a. 07/2016 NSTEMI/PCI: LM nl, LAD 70ost, 10md, 80d, D1 40ost, LCX 99ost/p (2.5x16 Synergy DES - 2.75), 40/686mOm2 100 CTO, RCA  30p/m. Pt eval by CT surgery, not felt to be suitable candidate 2/2 comorbidities.  . CERVICAL RADICULOPATHY, LEFT 01/25/2008  . Chronic systolic CHF (congestive heart failure) ([facility]   a. 07/2016 Echo: EF 40-45%, diff HK, sev AI, mild MVP w/ mod to sev  MR, PASP 5125m.  . Critical lower limb ischemia ([facility] . DIABETES MELLITUS, UNCONTROLLED 05/21/2009  . Esophagitis   . ESRD on dialysis ([facility] . Foot ulcer ([place]  right lateral malleolus  . HCAP (healthcare-associated pneumonia) 06/29/2016  . HYPERLIPIDEMIA 03/30/2007  . HYPERTENSION 01/25/2008  . Ischemic cardiomyopathy    a. 07/2016 Echo: EF 40-45%.  . Mitral regurgitation    a. 07/2016 Echo: Mild MVP w/ mod to sev MR.  . PMarland KitchenD (peripheral vascular disease) ([facility] [place]  a. s/p bilat BKA  . SECONDARY HYPERPARATHYROIDISM 05/21/2009  . Severe aortic regurgitation    a. 07/2016 Echo: Severe AI.   Past Surgical History:  Procedure Laterality Date  . AMPUTATION Right 03/15/2014   Procedure: RIGHT  LEG  BELOW KNEE AMPUTATION ;  Surgeon: JohWylene SimmerD;  [facility];  Laterality: Right;  . AMPUTATION Left 11/29/2015   Procedure: AMPUTATION BELOW KNEE;  Surgeon: MarNewt MinionD;  [facility];  Laterality: Left;  . AV FISTULA PLACEMENT Left   . AV FISTULA PLACEMENT Left 09/06/2014   Procedure: ARTERIOVENOUS (AV) FISTULA CREATION-Left Brachiocephalic;  Surgeon: BriConrad BurlingtonD;  [facility];  Laterality: Left;  . CARDIAC CATHETERIZATION N/A 06/30/2016   Procedure: Left Heart Cath and Coronary Angiography;  Surgeon: ChrBurnell BlanksD;  [facility];  Service: Cardiovascular;  Laterality: N/A;  . CARDIAC CATHETERIZATION N/A 07/02/2016   Procedure: Coronary Stent Intervention;  Surgeon: ChrNelva BushD;  [facility];  Service: Cardiovascular;  Laterality: N/A;  . CATARACT EXTRACTION    . COLONOSCOPY    . CORONARY ANGIOPLASTY WITH STENT PLACEMENT  07/02/2016  . ESOPHAGOGASTRODUODENOSCOPY N/A 03/20/2016   Procedure: ESOPHAGOGASTRODUODENOSCOPY (EGD);  Surgeon: DanMilus BanisterD;  [facility];  Laterality: N/A;  . EYE SURGERY Bilateral     cataracts removed, left eye still has some oil in it.  . INSERTION OF DIALYSIS CATHETER N/A 09/03/2014   Procedure: INSERTION OF DIALYSIS CATHETER RIGHT INTERNAL JUGULAR VEIN;  Surgeon: BriConrad BurlingtonD;  [facility];  Laterality: N/A;  . PERIPHERAL VASCULAR CATHETERIZATION N/A 08/13/2015   Procedure: Abdominal Aortogram;  Surgeon: ChaElam DutchD;  [facility];  Service: Cardiovascular;  Laterality: N/A;    FAMILY HISTORY Family History  Problem Relation Age of Onset  . Cancer Mother        Pancreatic    SOCIAL HISTORY Social History   Tobacco Use  . Smoking status: Never Smoker  . Smokeless tobacco: Never Used  Vaping Use  . Vaping Use: Never used  Substance Use Topics  . Alcohol use:  No    Alcohol/week: 0.0 standard drinks  . Drug use: No         OPHTHALMIC EXAM:  Base Eye Exam    Visual Acuity (Snellen - Linear)      Right Left   Dist cc 20/40 +1 20/40 -1   Dist ph cc 20/30 -2 20/30 -2   Correction: Glasses       Tonometry (Tonopen, 9:56 AM)      Right Left   Pressure 12 17       Pupils      Dark Light Shape React APD   Right 2 1 Round Minimal None   Left 2 1 Round Minimal None       Visual Fields    Counting fingers       Extraocular Movement      Right Left    Full, Ortho Full, Ortho       Neuro/Psych    Oriented x3: Yes   Mood/Affect: Normal       Dilation    Both eyes: 1.0% Mydriacyl, 2.5% Phenylephrine @ 9:56 AM        Slit Lamp and Fundus Exam    Slit Lamp Exam      Right Left   Lids/Lashes dermatochalasis dermatochalasis   Conjunctiva/Sclera White and quiet White and quiet   Cornea arcus; 1+ PEE; 2+ guttata arcus; 1-2+ guttae   Anterior Chamber Deep and quiet Deep and quiet   Iris Round, dilated; no NVI Round, dilated; large PI at 0600 no NVI   Lens PCIOL in good position, white, hazy opacity inferior IOL PCIOL in good position, open PC   Vitreous clear clear       Fundus Exam       Right Left   Disc Mild temp PPA, sharp rim, mild pallor Mild Pallor, sharp rim, Compact   C/D Ratio 0.3 0.35   Macula Blunted foveal reflex with +edema / cystic changes -- slightly improved, Inferior Epiretinal membrane, scattered MA, laser scars encroaching into macula Blunted foveal reflex, scattered focal Exudates, Retinal pigment epithelial mottling, focal laser scars   Vessels Vascular attenuation, sclerotic arterioles--greatest nasal disc, mild tortuousity sclerotic arterioles, generalized attenuation   Periphery Attached, dense 360 PRP, scattered areas of fibrosis Attached, 360 PRP in place, areas of fibrosis, focal DBH nasal to disc        Refraction    Wearing Rx      Sphere Cylinder Axis Add   Right +0.25 +0.25 037 +2.50   Left [place] +0.75 175 +2.50          IMAGING AND PROCEDURES  Imaging and Procedures for 12/30/17  OCT, Retina - OU - Both Eyes       Right Eye Quality was good. Central Foveal Thickness: 319. Progression has improved. Findings include abnormal foveal contour, no SRF, intraretinal fluid, epiretinal membrane, intraretinal hyper-reflective material, outer retinal atrophy, inner retinal atrophy (Mild interval improvement in IRF/central cysts).   Left Eye Quality was good. Central Foveal Thickness: 261. Progression has been stable. Findings include abnormal foveal contour, no IRF, no SRF, macular pucker, epiretinal membrane, outer retinal atrophy (Rare cystic changes caught on temporal widefield).   Notes *Images captured and stored on drive  Diagnosis / Impression:  OD: mild interval improvement in IRF/central cysts OS: no DME; +ERM w/ some pucker and preretinal fibrosis--stable   Clinical management:  See below  Abbreviations: NFP - Normal foveal profile. CME - cystoid macular edema. PED - pigment epithelial detachment. IRF -  intraretinal fluid. SRF - subretinal fluid. EZ - ellipsoid zone. ERM - epiretinal membrane. ORA - outer retinal atrophy.  ORT - outer retinal tubulation. SRHM - subretinal hyper-reflective material         Intravitreal Injection, Pharmacologic Agent - OD - Right Eye       Time Out 07/02/2020. 10:27 AM. Confirmed correct patient, procedure, site, and patient consented.   Anesthesia Topical anesthesia was used. Anesthetic medications included Lidocaine 2%, Proparacaine 0.5%.   Procedure Preparation included 5% betadine to ocular surface, eyelid speculum. A (32g) needle was used.   Injection:  2 mg aflibercept Alfonse Flavors) SOLN   NDC: A3590391, Lot: [id number], Expiration date: 08/31/2020   Route: Intravitreal, Site: Right Eye, Waste: 0.05 mL  Post-op Post injection exam found visual acuity of at least counting fingers. The patient tolerated the procedure well. There were no complications. The patient received written and verbal post procedure care education. Post injection medications were not given.                 ASSESSMENT/PLAN:    ICD-10-CM   1. Proliferative diabetic retinopathy of right eye with macular edema associated with type 2 diabetes mellitus (HCC)  I50.2774 Intravitreal Injection, Pharmacologic Agent - OD - Right Eye    aflibercept (EYLEA) SOLN 2 mg  2. Stable proliferative diabetic retinopathy of left eye associated with type 2 diabetes mellitus ([place])  J28.7867   3. Retinal edema  H35.81 OCT, Retina - OU - Both Eyes  4. Pseudophakia of both eyes  Z96.1   5. Corneal guttata of both eyes  H18.513     1,2,3. Proliferative diabetic retinopathy OU-  - lost to f/u since September 2019, but saw JDM on 05.11.21, pt received IVA OD #5 on that day  - s/p PPV OU with [facility]in in 2014  - s/p PRP OU with [facility]in -- extensive PRP with extension into macula in some parts  - S/P IVA OD #1 (02.26.19), #2 (04.02.19), #3 (05.02.19), #4 (05.30.19), #5 (05.11.21 JDM), #6 (06.08.21)  - S/P IVTA #1 OD (06.27.19) -- switched due to minimal improvement on IVA, #2 (08.13.19)   - s/p IVE OD #1 (09.24.19)  - sample, #2 (07.15.21), # 3(8.24.21), #4 (9.28.21)  - exam shows good PRP 360 OU; sclerotic arterioles with sheathing OU; scattered DBH OU  - BCVA improved to 20/30-2 today  - OCT shows interval improvement in IRF OD  - recommend IVE OD #5 today, 11.2.21  - [place] consent form obtained, signed, and scanned on 06.08.21  - Eylea informed consent form signed and scanned on 07.15.2021  - Eylea benefits paperwork signed on 09.24.19 -- pt is approved for Eylea for 2021  - f/u in 6 weeks -- DFE/OCT, possible injection  4. Pseudophakia OU  - s/p CE/IOL OU by Dr. Elliot Dally  - beautiful surgeries, doing well  - monitor  5. Corneal guttata OU  - no edema  - monitor  Ophthalmic Meds Ordered this visit:  Meds ordered this encounter  Medications  . aflibercept (EYLEA) SOLN 2 mg      Return in about 6 weeks (around 08/13/2020) for f/u PDR OU, DFE, OCT.  There are no Patient Instructions on file for this visit.  Explained the diagnoses, plan, and follow up with the patient and they expressed understanding.  Patient expressed understanding of the importance of proper follow up care.   This document serves as a record of services personally performed by Gardiner Sleeper, MD, PhD. It was created  on their behalf by Estill Bakes, [place] an ophthalmic technician. The creation of this record is the provider's dictation and/or activities during the visit.    Electronically signed by: Estill Bakes, [place] 10.28.21 @ 11:38 PM  Gardiner Sleeper, M.D., Ph.D. Diseases & Surgery of the Retina and [place] 07/02/2020   I have reviewed the above documentation for accuracy and completeness, and I agree with the above. Gardiner Sleeper, M.D., Ph.D. 07/02/20 11:38 PM   Abbreviations: M myopia (nearsighted); A astigmatism; H hyperopia (farsighted); P presbyopia; Mrx spectacle prescription;  CTL contact lenses; OD right eye; OS left eye; OU both eyes  XT exotropia; ET esotropia;  PEK punctate epithelial keratitis; PEE punctate epithelial erosions; DES dry eye syndrome; MGD meibomian gland dysfunction; ATs artificial tears; PFAT's preservative free artificial tears; [place] nuclear sclerotic cataract; PSC posterior subcapsular cataract; ERM epi-retinal membrane; PVD posterior vitreous detachment; RD retinal detachment; DM diabetes mellitus; DR diabetic retinopathy; NPDR non-proliferative diabetic retinopathy; PDR proliferative diabetic retinopathy; CSME clinically significant macular edema; DME diabetic macular edema; dbh dot blot hemorrhages; CWS cotton wool spot; POAG primary open angle glaucoma; C/D cup-to-disc ratio; HVF humphrey visual field; GVF goldmann visual field; OCT optical coherence tomography; IOP intraocular pressure; BRVO Branch retinal vein occlusion; CRVO central retinal vein occlusion; CRAO central retinal artery occlusion; BRAO branch retinal artery occlusion; RT retinal tear; SB scleral buckle; PPV pars plana vitrectomy; VH Vitreous hemorrhage; PRP panretinal laser photocoagulation; IVK intravitreal kenalog; VMT vitreomacular traction; MH Macular hole;  NVD neovascularization of the disc; NVE neovascularization elsewhere; AREDS age related eye disease study; ARMD age related macular degeneration; POAG primary open angle glaucoma; EBMD epithelial/anterior basement membrane dystrophy; ACIOL anterior chamber intraocular lens; IOL intraocular lens; PCIOL posterior chamber intraocular lens; Phaco/IOL phacoemulsification with intraocular lens placement; [place] photorefractive keratectomy; LASIK laser assisted in situ keratomileusis; HTN hypertension; DM diabetes mellitus; COPD chronic obstructive pulmonary disease

## 2020-06-28 DIAGNOSIS — N2581 Secondary hyperparathyroidism of renal origin: Secondary | ICD-10-CM | POA: Diagnosis not present

## 2020-06-28 DIAGNOSIS — Z992 Dependence on renal dialysis: Secondary | ICD-10-CM | POA: Diagnosis not present

## 2020-06-28 DIAGNOSIS — N186 End stage renal disease: Secondary | ICD-10-CM | POA: Diagnosis not present

## 2020-06-30 DIAGNOSIS — Z992 Dependence on renal dialysis: Secondary | ICD-10-CM | POA: Diagnosis not present

## 2020-06-30 DIAGNOSIS — N186 End stage renal disease: Secondary | ICD-10-CM | POA: Diagnosis not present

## 2020-06-30 DIAGNOSIS — E1129 Type 2 diabetes mellitus with other diabetic kidney complication: Secondary | ICD-10-CM | POA: Diagnosis not present

## 2020-07-01 DIAGNOSIS — Z992 Dependence on renal dialysis: Secondary | ICD-10-CM | POA: Diagnosis not present

## 2020-07-01 DIAGNOSIS — N186 End stage renal disease: Secondary | ICD-10-CM | POA: Diagnosis not present

## 2020-07-01 DIAGNOSIS — N2581 Secondary hyperparathyroidism of renal origin: Secondary | ICD-10-CM | POA: Diagnosis not present

## 2020-07-02 ENCOUNTER — Ambulatory Visit (INDEPENDENT_AMBULATORY_CARE_PROVIDER_SITE_OTHER): Payer: Medicare PPO | Admitting: Ophthalmology

## 2020-07-02 ENCOUNTER — Other Ambulatory Visit: Payer: Self-pay

## 2020-07-02 ENCOUNTER — Encounter (INDEPENDENT_AMBULATORY_CARE_PROVIDER_SITE_OTHER): Payer: Self-pay | Admitting: Ophthalmology

## 2020-07-02 DIAGNOSIS — E113552 Type 2 diabetes mellitus with stable proliferative diabetic retinopathy, left eye: Secondary | ICD-10-CM

## 2020-07-02 DIAGNOSIS — H3581 Retinal edema: Secondary | ICD-10-CM | POA: Diagnosis not present

## 2020-07-02 DIAGNOSIS — H18513 Endothelial corneal dystrophy, bilateral: Secondary | ICD-10-CM

## 2020-07-02 DIAGNOSIS — E113511 Type 2 diabetes mellitus with proliferative diabetic retinopathy with macular edema, right eye: Secondary | ICD-10-CM

## 2020-07-02 DIAGNOSIS — Z961 Presence of intraocular lens: Secondary | ICD-10-CM

## 2020-07-02 MED ORDER — AFLIBERCEPT 2MG/0.05ML IZ SOLN FOR KALEIDOSCOPE
2.0000 mg | INTRAVITREAL | Status: AC | PRN
  Administered 2020-07-02: 2 mg via INTRAVITREAL

## 2020-07-03 DIAGNOSIS — Z992 Dependence on renal dialysis: Secondary | ICD-10-CM | POA: Diagnosis not present

## 2020-07-03 DIAGNOSIS — N2581 Secondary hyperparathyroidism of renal origin: Secondary | ICD-10-CM | POA: Diagnosis not present

## 2020-07-03 DIAGNOSIS — N186 End stage renal disease: Secondary | ICD-10-CM | POA: Diagnosis not present

## 2020-07-04 DIAGNOSIS — Z794 Long term (current) use of insulin: Secondary | ICD-10-CM | POA: Diagnosis not present

## 2020-07-04 DIAGNOSIS — H16223 Keratoconjunctivitis sicca, not specified as Sjogren's, bilateral: Secondary | ICD-10-CM | POA: Diagnosis not present

## 2020-07-04 DIAGNOSIS — E11319 Type 2 diabetes mellitus with unspecified diabetic retinopathy without macular edema: Secondary | ICD-10-CM | POA: Diagnosis not present

## 2020-07-04 DIAGNOSIS — H40023 Open angle with borderline findings, high risk, bilateral: Secondary | ICD-10-CM | POA: Diagnosis not present

## 2020-07-05 DIAGNOSIS — Z992 Dependence on renal dialysis: Secondary | ICD-10-CM | POA: Diagnosis not present

## 2020-07-05 DIAGNOSIS — N186 End stage renal disease: Secondary | ICD-10-CM | POA: Diagnosis not present

## 2020-07-05 DIAGNOSIS — N2581 Secondary hyperparathyroidism of renal origin: Secondary | ICD-10-CM | POA: Diagnosis not present

## 2020-07-08 DIAGNOSIS — N2581 Secondary hyperparathyroidism of renal origin: Secondary | ICD-10-CM | POA: Diagnosis not present

## 2020-07-08 DIAGNOSIS — Z992 Dependence on renal dialysis: Secondary | ICD-10-CM | POA: Diagnosis not present

## 2020-07-08 DIAGNOSIS — N186 End stage renal disease: Secondary | ICD-10-CM | POA: Diagnosis not present

## 2020-07-10 DIAGNOSIS — Z992 Dependence on renal dialysis: Secondary | ICD-10-CM | POA: Diagnosis not present

## 2020-07-10 DIAGNOSIS — N186 End stage renal disease: Secondary | ICD-10-CM | POA: Diagnosis not present

## 2020-07-10 DIAGNOSIS — N2581 Secondary hyperparathyroidism of renal origin: Secondary | ICD-10-CM | POA: Diagnosis not present

## 2020-07-12 DIAGNOSIS — N2581 Secondary hyperparathyroidism of renal origin: Secondary | ICD-10-CM | POA: Diagnosis not present

## 2020-07-12 DIAGNOSIS — N186 End stage renal disease: Secondary | ICD-10-CM | POA: Diagnosis not present

## 2020-07-12 DIAGNOSIS — Z992 Dependence on renal dialysis: Secondary | ICD-10-CM | POA: Diagnosis not present

## 2020-07-15 DIAGNOSIS — N186 End stage renal disease: Secondary | ICD-10-CM | POA: Diagnosis not present

## 2020-07-15 DIAGNOSIS — N2581 Secondary hyperparathyroidism of renal origin: Secondary | ICD-10-CM | POA: Diagnosis not present

## 2020-07-15 DIAGNOSIS — Z992 Dependence on renal dialysis: Secondary | ICD-10-CM | POA: Diagnosis not present

## 2020-07-17 DIAGNOSIS — Z992 Dependence on renal dialysis: Secondary | ICD-10-CM | POA: Diagnosis not present

## 2020-07-17 DIAGNOSIS — N2581 Secondary hyperparathyroidism of renal origin: Secondary | ICD-10-CM | POA: Diagnosis not present

## 2020-07-17 DIAGNOSIS — N186 End stage renal disease: Secondary | ICD-10-CM | POA: Diagnosis not present

## 2020-07-19 DIAGNOSIS — Z992 Dependence on renal dialysis: Secondary | ICD-10-CM | POA: Diagnosis not present

## 2020-07-19 DIAGNOSIS — N2581 Secondary hyperparathyroidism of renal origin: Secondary | ICD-10-CM | POA: Diagnosis not present

## 2020-07-19 DIAGNOSIS — N186 End stage renal disease: Secondary | ICD-10-CM | POA: Diagnosis not present

## 2020-07-21 DIAGNOSIS — N186 End stage renal disease: Secondary | ICD-10-CM | POA: Diagnosis not present

## 2020-07-21 DIAGNOSIS — N2581 Secondary hyperparathyroidism of renal origin: Secondary | ICD-10-CM | POA: Diagnosis not present

## 2020-07-21 DIAGNOSIS — Z992 Dependence on renal dialysis: Secondary | ICD-10-CM | POA: Diagnosis not present

## 2020-07-23 DIAGNOSIS — N2581 Secondary hyperparathyroidism of renal origin: Secondary | ICD-10-CM | POA: Diagnosis not present

## 2020-07-23 DIAGNOSIS — Z992 Dependence on renal dialysis: Secondary | ICD-10-CM | POA: Diagnosis not present

## 2020-07-23 DIAGNOSIS — N186 End stage renal disease: Secondary | ICD-10-CM | POA: Diagnosis not present

## 2020-07-26 DIAGNOSIS — Z992 Dependence on renal dialysis: Secondary | ICD-10-CM | POA: Diagnosis not present

## 2020-07-26 DIAGNOSIS — N186 End stage renal disease: Secondary | ICD-10-CM | POA: Diagnosis not present

## 2020-07-26 DIAGNOSIS — N2581 Secondary hyperparathyroidism of renal origin: Secondary | ICD-10-CM | POA: Diagnosis not present

## 2020-07-29 DIAGNOSIS — N2581 Secondary hyperparathyroidism of renal origin: Secondary | ICD-10-CM | POA: Diagnosis not present

## 2020-07-29 DIAGNOSIS — Z992 Dependence on renal dialysis: Secondary | ICD-10-CM | POA: Diagnosis not present

## 2020-07-29 DIAGNOSIS — N186 End stage renal disease: Secondary | ICD-10-CM | POA: Diagnosis not present

## 2020-07-30 DIAGNOSIS — Z992 Dependence on renal dialysis: Secondary | ICD-10-CM | POA: Diagnosis not present

## 2020-07-30 DIAGNOSIS — E1129 Type 2 diabetes mellitus with other diabetic kidney complication: Secondary | ICD-10-CM | POA: Diagnosis not present

## 2020-07-30 DIAGNOSIS — N186 End stage renal disease: Secondary | ICD-10-CM | POA: Diagnosis not present

## 2020-07-31 DIAGNOSIS — N186 End stage renal disease: Secondary | ICD-10-CM | POA: Diagnosis not present

## 2020-07-31 DIAGNOSIS — N2581 Secondary hyperparathyroidism of renal origin: Secondary | ICD-10-CM | POA: Diagnosis not present

## 2020-07-31 DIAGNOSIS — Z992 Dependence on renal dialysis: Secondary | ICD-10-CM | POA: Diagnosis not present

## 2020-08-01 ENCOUNTER — Other Ambulatory Visit: Payer: Self-pay | Admitting: Internal Medicine

## 2020-08-02 DIAGNOSIS — N2581 Secondary hyperparathyroidism of renal origin: Secondary | ICD-10-CM | POA: Diagnosis not present

## 2020-08-02 DIAGNOSIS — N186 End stage renal disease: Secondary | ICD-10-CM | POA: Diagnosis not present

## 2020-08-02 DIAGNOSIS — Z992 Dependence on renal dialysis: Secondary | ICD-10-CM | POA: Diagnosis not present

## 2020-08-05 DIAGNOSIS — N186 End stage renal disease: Secondary | ICD-10-CM | POA: Diagnosis not present

## 2020-08-05 DIAGNOSIS — Z992 Dependence on renal dialysis: Secondary | ICD-10-CM | POA: Diagnosis not present

## 2020-08-05 DIAGNOSIS — N2581 Secondary hyperparathyroidism of renal origin: Secondary | ICD-10-CM | POA: Diagnosis not present

## 2020-08-07 DIAGNOSIS — N2581 Secondary hyperparathyroidism of renal origin: Secondary | ICD-10-CM | POA: Diagnosis not present

## 2020-08-07 DIAGNOSIS — Z992 Dependence on renal dialysis: Secondary | ICD-10-CM | POA: Diagnosis not present

## 2020-08-07 DIAGNOSIS — N186 End stage renal disease: Secondary | ICD-10-CM | POA: Diagnosis not present

## 2020-08-08 NOTE — Progress Notes (Shared)
[facility] Note  08/13/2020     CHIEF COMPLAINT Patient presents for No chief complaint on file.   HISTORY OF PRESENT ILLNESS: Carolyn Valenzuela is a 69 y.o. female who presents to the clinic today for:     Referring physician: Biagio Borg, MD [place],  [place] [id number]  HISTORICAL INFORMATION:   Selected notes from the MEDICAL RECORD NUMBER Referred by Dr. Shirley Muscat for concern of DME OD and hard exudates OS   CURRENT MEDICATIONS: No current outpatient medications on file. (Ophthalmic Drugs)   Current Facility-Administered Medications (Ophthalmic Drugs)  Medication Route  . triamcinolone acetonide (TRIESENCE) 40 MG/ML subtenons injection 4 mg Intravitreal   Current Outpatient Medications (Other)  Medication Sig  . Accu-Chek Softclix Lancets lancets Use to check blood sugars 2x a day. DX: E11.9  . acetaminophen (TYLENOL) 500 MG tablet Take 500 mg by mouth every 6 (six) hours as needed for mild pain.  [name] amLODipine (NORVASC) 10 MG tablet Take 10 mg by mouth at bedtime.  [name] aspirin 81 MG EC tablet Take 81 mg by mouth daily.    . Blood Glucose Monitoring Suppl (ACCU-CHEK GUIDE ME) w/Device KIT USE DEVICE TO CHECK BLOOD SUGARS 2X A DAY. DX: E11.9  . clopidogrel (PLAVIX) 75 MG tablet TAKE 1 TABLET BY MOUTH EVERY DAY  . gabapentin (NEURONTIN) 100 MG capsule TAKE 2 CAPSULES (200 MG TOTAL) BY MOUTH 3 (THREE) TIMES DAILY.  [name] glipiZIDE (GLUCOTROL XL) 10 MG 24 hr tablet TAKE 1 TABLET (10 MG TOTAL) BY MOUTH DAILY WITH BREAKFAST.  [name] glucose blood test strip Use to check Blood Sugars 2x a day. DX: E11.9  . Influenza Vac High-Dose Quad (FLUZONE HIGH-DOSE QUADRIVALENT IM)   . JANUVIA 100 MG tablet TAKE 1 TABLET BY MOUTH EVERY DAY  . multivitamin (RENA-VIT) TABS tablet TAKE 1 TABLET BY MOUTH EVERYDAY AT BEDTIME  . nebivolol (BYSTOLIC) 5 MG tablet Take 1 tablet (5 mg total) by mouth daily.  . ondansetron (ZOFRAN ODT) 4 MG disintegrating tablet  Take 1 tablet (4 mg total) by mouth every 6 (six) hours as needed.  . pantoprazole (PROTONIX) 40 MG tablet TAKE 1 TABLET BY MOUTH TWICE A DAY  . rosuvastatin (CRESTOR) 40 MG tablet Take 1 tablet (40 mg total) by mouth daily.  . traMADol (ULTRAM) 50 MG tablet Take 1 tablet (50 mg total) by mouth every 6 (six) hours as needed.  . VELPHORO 500 MG chewable tablet    No current facility-administered medications for this visit. (Other)      REVIEW OF SYSTEMS:    ALLERGIES No Known Allergies  PAST MEDICAL HISTORY Past Medical History:  Diagnosis Date  . Allergic rhinitis, cause unspecified 12/08/2010  . ANEMIA-IRON DEFICIENCY 01/25/2008  . Aortic regurgitation   . CAD (coronary artery disease)    a. 07/2016 NSTEMI/PCI: LM nl, LAD 70ost, 36md, 80d, D1 40ost, LCX 99ost/p (2.5x16 Synergy DES - 2.75), 40/656mOm2 100 CTO, RCA  30p/m. Pt eval by CT surgery, not felt to be suitable candidate 2/2 comorbidities.  . CERVICAL RADICULOPATHY, LEFT 01/25/2008  . Chronic systolic CHF (congestive heart failure) ([facility]   a. 07/2016 Echo: EF 40-45%, diff HK, sev AI, mild MVP w/ mod to sev MR, PASP 5179m.  . Critical lower limb ischemia ([facility] . DIABETES MELLITUS, UNCONTROLLED 05/21/2009  . Esophagitis   . ESRD on dialysis ([facility] . Foot ulcer ([facility]  right lateral malleolus  .  HCAP (healthcare-associated pneumonia) 06/29/2016  . HYPERLIPIDEMIA 03/30/2007  . HYPERTENSION 01/25/2008  . Ischemic cardiomyopathy    a. 07/2016 Echo: EF 40-45%.  . Mitral regurgitation    a. 07/2016 Echo: Mild MVP w/ mod to sev MR.  [name] PVD (peripheral vascular disease) ([place])    a. s/p bilat BKA  . SECONDARY HYPERPARATHYROIDISM 05/21/2009  . Severe aortic regurgitation    a. 07/2016 Echo: Severe AI.   Past Surgical History:  Procedure Laterality Date  . AMPUTATION Right 03/15/2014   Procedure: RIGHT  LEG  BELOW KNEE AMPUTATION ;  Surgeon: [name], MD;  Location: [facility];  Service: Orthopedics;  Laterality: Right;  .  AMPUTATION Left 11/29/2015   Procedure: AMPUTATION BELOW KNEE;  Surgeon: Newt Minion, MD;  Location: [facility];  Service: Orthopedics;  Laterality: Left;  . AV FISTULA PLACEMENT Left   . AV FISTULA PLACEMENT Left 09/06/2014   Procedure: ARTERIOVENOUS (AV) FISTULA CREATION-Left Brachiocephalic;  Surgeon: Conrad Wonder [place], MD;  Location: [facility];  Service: Vascular;  Laterality: Left;  . CARDIAC CATHETERIZATION N/A 06/30/2016   Procedure: Left Heart Cath and Coronary Angiography;  Surgeon: Burnell Blanks, MD;  Location: [facility];  Service: Cardiovascular;  Laterality: N/A;  . CARDIAC CATHETERIZATION N/A 07/02/2016   Procedure: Coronary Stent Intervention;  Surgeon: Nelva Bush, MD;  Location: [facility];  Service: Cardiovascular;  Laterality: N/A;  . CATARACT EXTRACTION    . COLONOSCOPY    . CORONARY ANGIOPLASTY WITH STENT PLACEMENT  07/02/2016  . ESOPHAGOGASTRODUODENOSCOPY N/A 03/20/2016   Procedure: ESOPHAGOGASTRODUODENOSCOPY (EGD);  Surgeon: Milus Banister, MD;  Location: [facility];  Service: Endoscopy;  Laterality: N/A;  . EYE SURGERY Bilateral    cataracts removed, left eye still has some oil in it.  . INSERTION OF DIALYSIS CATHETER N/A 09/03/2014   Procedure: INSERTION OF DIALYSIS CATHETER RIGHT INTERNAL JUGULAR VEIN;  Surgeon: Conrad Center, MD;  Location: [facility];  Service: Vascular;  Laterality: N/A;  . PERIPHERAL VASCULAR CATHETERIZATION N/A 08/13/2015   Procedure: Abdominal Aortogram;  Surgeon: Elam Dutch, MD;  Location: [facility];  Service: Cardiovascular;  Laterality: N/A;    FAMILY HISTORY Family History  Problem Relation Age of Onset  . Cancer Mother        Pancreatic    SOCIAL HISTORY Social History   Tobacco Use  . Smoking status: Never Smoker  . Smokeless tobacco: Never Used  Vaping Use  . Vaping Use: Never used  Substance Use Topics  . Alcohol use: No    Alcohol/week: 0.0 standard drinks  . Drug use: No         OPHTHALMIC  EXAM:  Not recorded     IMAGING AND PROCEDURES  Imaging and Procedures for 12/30/17           ASSESSMENT/PLAN:  No diagnosis found.  1,2,3. Proliferative diabetic retinopathy OU-  - lost to f/u since September 2019, but saw JDM on 05.11.21, pt received IVA OD #5 on that day  - s/p PPV OU with [facility] in 2014  - s/p PRP OU with [facility] -- extensive PRP with extension into macula in some parts  - S/P IVA OD #1 (02.26.19), #2 (04.02.19), #3 (05.02.19), #4 (05.30.19), #5 (05.11.21 JDM), #6 (06.08.21)  - S/P IVTA #1 OD (06.27.19) -- switched due to minimal improvement on IVA, #2 (08.13.19)   - s/p IVE OD #1 (09.24.19) - sample, #2 (07.15.21), # 3(8.24.21), #4 (9.28.21), #5 (11.2.21)  - exam shows  good PRP 360 OU; sclerotic arterioles with sheathing OU; scattered DBH OU  - BCVA improved to 20/30-2 today  - OCT shows interval improvement in IRF OD  - recommend IVE OD #5 today, 12.14.21  - [place] consent form6obtained, signed, and scanned on 06.08.21  - Eylea informed consent form signed and scanned on 07.15.2021  - Eylea benefits paperwork signed on 09.24.19 -- pt is approved for Eylea for 2021  - f/u in 6 weeks -- DFE/OCT, possible injection  4. Pseudophakia OU  - s/p CE/IOL OU by Dr. Elliot Dally  - beautiful surgeries, doing well  - monitor  5. Corneal guttata OU  - no edema  - monitor  Ophthalmic Meds Ordered this visit:  No orders of the defined types were placed in this encounter.     No follow-ups on file.  There are no Patient Instructions on file for this visit.  Explained the diagnoses, plan, and follow up with the patient and they expressed understanding.  Patient expressed understanding of the importance of proper follow up care.     Abbreviations: M myopia (nearsighted); A astigmatism; H hyperopia (farsighted); P presbyopia; Mrx spectacle prescription;  CTL contact lenses; OD right eye; OS left eye; OU both eyes  XT exotropia; ET esotropia; PEK punctate  epithelial keratitis; PEE punctate epithelial erosions; DES dry eye syndrome; MGD meibomian gland dysfunction; ATs artificial tears; PFAT's preservative free artificial tears; [place] nuclear sclerotic cataract; PSC posterior subcapsular cataract; ERM epi-retinal membrane; PVD posterior vitreous detachment; RD retinal detachment; DM diabetes mellitus; DR diabetic retinopathy; NPDR non-proliferative diabetic retinopathy; PDR proliferative diabetic retinopathy; CSME clinically significant macular edema; DME diabetic macular edema; dbh dot blot hemorrhages; CWS cotton wool spot; POAG primary open angle glaucoma; C/D cup-to-disc ratio; HVF humphrey visual field; GVF goldmann visual field; OCT optical coherence tomography; IOP intraocular pressure; BRVO Branch retinal vein occlusion; CRVO central retinal vein occlusion; CRAO central retinal artery occlusion; BRAO branch retinal artery occlusion; RT retinal tear; SB scleral buckle; PPV pars plana vitrectomy; VH Vitreous hemorrhage; PRP panretinal laser photocoagulation; IVK intravitreal kenalog; VMT vitreomacular traction; MH Macular hole;  NVD neovascularization of the disc; NVE neovascularization elsewhere; AREDS age related eye disease study; ARMD age related macular degeneration; POAG primary open angle glaucoma; EBMD epithelial/anterior basement membrane dystrophy; ACIOL anterior chamber intraocular lens; IOL intraocular lens; PCIOL posterior chamber intraocular lens; Phaco/IOL phacoemulsification with intraocular lens placement; [place] photorefractive keratectomy; LASIK laser assisted in situ keratomileusis; HTN hypertension; DM diabetes mellitus; COPD chronic obstructive pulmonary disease

## 2020-08-09 DIAGNOSIS — N186 End stage renal disease: Secondary | ICD-10-CM | POA: Diagnosis not present

## 2020-08-09 DIAGNOSIS — Z992 Dependence on renal dialysis: Secondary | ICD-10-CM | POA: Diagnosis not present

## 2020-08-09 DIAGNOSIS — N2581 Secondary hyperparathyroidism of renal origin: Secondary | ICD-10-CM | POA: Diagnosis not present

## 2020-08-12 ENCOUNTER — Other Ambulatory Visit: Payer: Self-pay | Admitting: Internal Medicine

## 2020-08-12 DIAGNOSIS — Z992 Dependence on renal dialysis: Secondary | ICD-10-CM | POA: Diagnosis not present

## 2020-08-12 DIAGNOSIS — N2581 Secondary hyperparathyroidism of renal origin: Secondary | ICD-10-CM | POA: Diagnosis not present

## 2020-08-12 DIAGNOSIS — N186 End stage renal disease: Secondary | ICD-10-CM | POA: Diagnosis not present

## 2020-08-12 NOTE — Telephone Encounter (Signed)
Please refill as per office routine med refill policy (all routine meds refilled for 3 mo or monthly per pt preference up to one year from last visit, then month to month grace period for 3 mo, then further med refills will have to be denied)  

## 2020-08-13 ENCOUNTER — Encounter (INDEPENDENT_AMBULATORY_CARE_PROVIDER_SITE_OTHER): Payer: Medicare PPO | Admitting: Ophthalmology

## 2020-08-13 DIAGNOSIS — E113552 Type 2 diabetes mellitus with stable proliferative diabetic retinopathy, left eye: Secondary | ICD-10-CM

## 2020-08-13 DIAGNOSIS — Z961 Presence of intraocular lens: Secondary | ICD-10-CM

## 2020-08-13 DIAGNOSIS — H3581 Retinal edema: Secondary | ICD-10-CM

## 2020-08-13 DIAGNOSIS — E113511 Type 2 diabetes mellitus with proliferative diabetic retinopathy with macular edema, right eye: Secondary | ICD-10-CM

## 2020-08-13 DIAGNOSIS — H18513 Endothelial corneal dystrophy, bilateral: Secondary | ICD-10-CM

## 2020-08-14 DIAGNOSIS — N186 End stage renal disease: Secondary | ICD-10-CM | POA: Diagnosis not present

## 2020-08-14 DIAGNOSIS — N2581 Secondary hyperparathyroidism of renal origin: Secondary | ICD-10-CM | POA: Diagnosis not present

## 2020-08-14 DIAGNOSIS — Z992 Dependence on renal dialysis: Secondary | ICD-10-CM | POA: Diagnosis not present

## 2020-08-16 DIAGNOSIS — N186 End stage renal disease: Secondary | ICD-10-CM | POA: Diagnosis not present

## 2020-08-16 DIAGNOSIS — Z992 Dependence on renal dialysis: Secondary | ICD-10-CM | POA: Diagnosis not present

## 2020-08-16 DIAGNOSIS — N2581 Secondary hyperparathyroidism of renal origin: Secondary | ICD-10-CM | POA: Diagnosis not present

## 2020-08-19 DIAGNOSIS — Z992 Dependence on renal dialysis: Secondary | ICD-10-CM | POA: Diagnosis not present

## 2020-08-19 DIAGNOSIS — N186 End stage renal disease: Secondary | ICD-10-CM | POA: Diagnosis not present

## 2020-08-19 DIAGNOSIS — N2581 Secondary hyperparathyroidism of renal origin: Secondary | ICD-10-CM | POA: Diagnosis not present

## 2020-08-20 DIAGNOSIS — N186 End stage renal disease: Secondary | ICD-10-CM | POA: Diagnosis not present

## 2020-08-20 DIAGNOSIS — I871 Compression of vein: Secondary | ICD-10-CM | POA: Diagnosis not present

## 2020-08-20 DIAGNOSIS — Z992 Dependence on renal dialysis: Secondary | ICD-10-CM | POA: Diagnosis not present

## 2020-08-21 DIAGNOSIS — N186 End stage renal disease: Secondary | ICD-10-CM | POA: Diagnosis not present

## 2020-08-21 DIAGNOSIS — N2581 Secondary hyperparathyroidism of renal origin: Secondary | ICD-10-CM | POA: Diagnosis not present

## 2020-08-21 DIAGNOSIS — Z992 Dependence on renal dialysis: Secondary | ICD-10-CM | POA: Diagnosis not present

## 2020-08-23 DIAGNOSIS — Z992 Dependence on renal dialysis: Secondary | ICD-10-CM | POA: Diagnosis not present

## 2020-08-23 DIAGNOSIS — N186 End stage renal disease: Secondary | ICD-10-CM | POA: Diagnosis not present

## 2020-08-23 DIAGNOSIS — N2581 Secondary hyperparathyroidism of renal origin: Secondary | ICD-10-CM | POA: Diagnosis not present

## 2020-08-26 DIAGNOSIS — Z992 Dependence on renal dialysis: Secondary | ICD-10-CM | POA: Diagnosis not present

## 2020-08-26 DIAGNOSIS — N2581 Secondary hyperparathyroidism of renal origin: Secondary | ICD-10-CM | POA: Diagnosis not present

## 2020-08-26 DIAGNOSIS — N186 End stage renal disease: Secondary | ICD-10-CM | POA: Diagnosis not present

## 2020-08-28 DIAGNOSIS — N2581 Secondary hyperparathyroidism of renal origin: Secondary | ICD-10-CM | POA: Diagnosis not present

## 2020-08-28 DIAGNOSIS — N186 End stage renal disease: Secondary | ICD-10-CM | POA: Diagnosis not present

## 2020-08-28 DIAGNOSIS — Z992 Dependence on renal dialysis: Secondary | ICD-10-CM | POA: Diagnosis not present

## 2020-08-29 DIAGNOSIS — Z794 Long term (current) use of insulin: Secondary | ICD-10-CM | POA: Diagnosis not present

## 2020-08-29 DIAGNOSIS — H16223 Keratoconjunctivitis sicca, not specified as Sjogren's, bilateral: Secondary | ICD-10-CM | POA: Diagnosis not present

## 2020-08-29 DIAGNOSIS — H40023 Open angle with borderline findings, high risk, bilateral: Secondary | ICD-10-CM | POA: Diagnosis not present

## 2020-08-29 DIAGNOSIS — E11319 Type 2 diabetes mellitus with unspecified diabetic retinopathy without macular edema: Secondary | ICD-10-CM | POA: Diagnosis not present

## 2020-08-30 DIAGNOSIS — N186 End stage renal disease: Secondary | ICD-10-CM | POA: Diagnosis not present

## 2020-08-30 DIAGNOSIS — N2581 Secondary hyperparathyroidism of renal origin: Secondary | ICD-10-CM | POA: Diagnosis not present

## 2020-08-30 DIAGNOSIS — Z992 Dependence on renal dialysis: Secondary | ICD-10-CM | POA: Diagnosis not present

## 2020-08-30 DIAGNOSIS — E1129 Type 2 diabetes mellitus with other diabetic kidney complication: Secondary | ICD-10-CM | POA: Diagnosis not present

## 2020-09-02 DIAGNOSIS — N186 End stage renal disease: Secondary | ICD-10-CM | POA: Diagnosis not present

## 2020-09-02 DIAGNOSIS — Z992 Dependence on renal dialysis: Secondary | ICD-10-CM | POA: Diagnosis not present

## 2020-09-02 DIAGNOSIS — N2581 Secondary hyperparathyroidism of renal origin: Secondary | ICD-10-CM | POA: Diagnosis not present

## 2020-09-04 DIAGNOSIS — N186 End stage renal disease: Secondary | ICD-10-CM | POA: Diagnosis not present

## 2020-09-04 DIAGNOSIS — Z992 Dependence on renal dialysis: Secondary | ICD-10-CM | POA: Diagnosis not present

## 2020-09-04 DIAGNOSIS — N2581 Secondary hyperparathyroidism of renal origin: Secondary | ICD-10-CM | POA: Diagnosis not present

## 2020-09-06 DIAGNOSIS — N186 End stage renal disease: Secondary | ICD-10-CM | POA: Diagnosis not present

## 2020-09-06 DIAGNOSIS — Z992 Dependence on renal dialysis: Secondary | ICD-10-CM | POA: Diagnosis not present

## 2020-09-06 DIAGNOSIS — N2581 Secondary hyperparathyroidism of renal origin: Secondary | ICD-10-CM | POA: Diagnosis not present

## 2020-09-07 ENCOUNTER — Other Ambulatory Visit: Payer: Self-pay | Admitting: Cardiovascular Disease

## 2020-09-09 DIAGNOSIS — N2581 Secondary hyperparathyroidism of renal origin: Secondary | ICD-10-CM | POA: Diagnosis not present

## 2020-09-09 DIAGNOSIS — N186 End stage renal disease: Secondary | ICD-10-CM | POA: Diagnosis not present

## 2020-09-09 DIAGNOSIS — Z992 Dependence on renal dialysis: Secondary | ICD-10-CM | POA: Diagnosis not present

## 2020-09-11 DIAGNOSIS — Z992 Dependence on renal dialysis: Secondary | ICD-10-CM | POA: Diagnosis not present

## 2020-09-11 DIAGNOSIS — N2581 Secondary hyperparathyroidism of renal origin: Secondary | ICD-10-CM | POA: Diagnosis not present

## 2020-09-11 DIAGNOSIS — N186 End stage renal disease: Secondary | ICD-10-CM | POA: Diagnosis not present

## 2020-09-12 ENCOUNTER — Ambulatory Visit: Payer: Medicare PPO | Admitting: Internal Medicine

## 2020-09-12 ENCOUNTER — Telehealth: Payer: Self-pay | Admitting: Cardiovascular Disease

## 2020-09-12 MED ORDER — NEBIVOLOL HCL 5 MG PO TABS
5.0000 mg | ORAL_TABLET | Freq: Every day | ORAL | 3 refills | Status: DC
Start: 2020-09-12 — End: 2021-08-22

## 2020-09-12 NOTE — Telephone Encounter (Signed)
Pt c/o medication issue:  1. Name of Medication: nebivolol (BYSTOLIC) 10 MG's BYSTOLIC 5 MG tablet [[id number]]   2. How are you currently taking this medication (dosage and times per day)? Currently taking the Bystolic 5 MG's 1 tablet daily. Not currently taking nebivolol 10 MG's  3. Are you having a reaction (difficulty breathing--STAT)? No   4. What is your medication issue? Carolyn Valenzuela is calling stating the pharmacy is now prescribing her nebivolol (BYSTOLIC) 10 MG's instead of Bystolic 5 mg's and she is requesting to be switched back. She states the 5 MG works better for her. Please advise.

## 2020-09-12 NOTE — Telephone Encounter (Signed)
Pt states her pharmacy wants to refill her bystolic for 10 mg, but she only takes 5. She is seeking clarification. Our records indicate the pt is on 5 mg of bystolic. Refill request sent to the pt's pharmacy.   Additionally, advised pt that she needs to make a follow up appointment with Dr. Gwenlyn Found, but she declined to do so during our call. She states she needs to coordinate transportation with her sister and will call to make the appointment after she finds out her availability.

## 2020-09-13 DIAGNOSIS — Z992 Dependence on renal dialysis: Secondary | ICD-10-CM | POA: Diagnosis not present

## 2020-09-13 DIAGNOSIS — N2581 Secondary hyperparathyroidism of renal origin: Secondary | ICD-10-CM | POA: Diagnosis not present

## 2020-09-13 DIAGNOSIS — N186 End stage renal disease: Secondary | ICD-10-CM | POA: Diagnosis not present

## 2020-09-16 DIAGNOSIS — N2581 Secondary hyperparathyroidism of renal origin: Secondary | ICD-10-CM | POA: Diagnosis not present

## 2020-09-16 DIAGNOSIS — N186 End stage renal disease: Secondary | ICD-10-CM | POA: Diagnosis not present

## 2020-09-16 DIAGNOSIS — Z992 Dependence on renal dialysis: Secondary | ICD-10-CM | POA: Diagnosis not present

## 2020-09-18 ENCOUNTER — Telehealth: Payer: Self-pay | Admitting: Internal Medicine

## 2020-09-18 DIAGNOSIS — Z992 Dependence on renal dialysis: Secondary | ICD-10-CM | POA: Diagnosis not present

## 2020-09-18 DIAGNOSIS — N186 End stage renal disease: Secondary | ICD-10-CM | POA: Diagnosis not present

## 2020-09-18 DIAGNOSIS — N2581 Secondary hyperparathyroidism of renal origin: Secondary | ICD-10-CM | POA: Diagnosis not present

## 2020-09-18 NOTE — Telephone Encounter (Signed)
LVM for pt to rtn my call to schedule awv with nha. Please schedule awv with nha if pt calls the office

## 2020-09-19 ENCOUNTER — Ambulatory Visit: Payer: Medicare PPO | Admitting: Internal Medicine

## 2020-09-20 DIAGNOSIS — N186 End stage renal disease: Secondary | ICD-10-CM | POA: Diagnosis not present

## 2020-09-20 DIAGNOSIS — N2581 Secondary hyperparathyroidism of renal origin: Secondary | ICD-10-CM | POA: Diagnosis not present

## 2020-09-20 DIAGNOSIS — Z992 Dependence on renal dialysis: Secondary | ICD-10-CM | POA: Diagnosis not present

## 2020-09-22 ENCOUNTER — Other Ambulatory Visit: Payer: Self-pay | Admitting: Internal Medicine

## 2020-09-23 DIAGNOSIS — N186 End stage renal disease: Secondary | ICD-10-CM | POA: Diagnosis not present

## 2020-09-23 DIAGNOSIS — Z992 Dependence on renal dialysis: Secondary | ICD-10-CM | POA: Diagnosis not present

## 2020-09-23 DIAGNOSIS — N2581 Secondary hyperparathyroidism of renal origin: Secondary | ICD-10-CM | POA: Diagnosis not present

## 2020-09-25 DIAGNOSIS — N186 End stage renal disease: Secondary | ICD-10-CM | POA: Diagnosis not present

## 2020-09-25 DIAGNOSIS — N2581 Secondary hyperparathyroidism of renal origin: Secondary | ICD-10-CM | POA: Diagnosis not present

## 2020-09-25 DIAGNOSIS — Z992 Dependence on renal dialysis: Secondary | ICD-10-CM | POA: Diagnosis not present

## 2020-09-27 DIAGNOSIS — N186 End stage renal disease: Secondary | ICD-10-CM | POA: Diagnosis not present

## 2020-09-27 DIAGNOSIS — N2581 Secondary hyperparathyroidism of renal origin: Secondary | ICD-10-CM | POA: Diagnosis not present

## 2020-09-27 DIAGNOSIS — Z992 Dependence on renal dialysis: Secondary | ICD-10-CM | POA: Diagnosis not present

## 2020-09-30 DIAGNOSIS — Z992 Dependence on renal dialysis: Secondary | ICD-10-CM | POA: Diagnosis not present

## 2020-09-30 DIAGNOSIS — N186 End stage renal disease: Secondary | ICD-10-CM | POA: Diagnosis not present

## 2020-09-30 DIAGNOSIS — N2581 Secondary hyperparathyroidism of renal origin: Secondary | ICD-10-CM | POA: Diagnosis not present

## 2020-09-30 DIAGNOSIS — E1129 Type 2 diabetes mellitus with other diabetic kidney complication: Secondary | ICD-10-CM | POA: Diagnosis not present

## 2020-10-02 DIAGNOSIS — N2581 Secondary hyperparathyroidism of renal origin: Secondary | ICD-10-CM | POA: Diagnosis not present

## 2020-10-02 DIAGNOSIS — Z992 Dependence on renal dialysis: Secondary | ICD-10-CM | POA: Diagnosis not present

## 2020-10-02 DIAGNOSIS — N186 End stage renal disease: Secondary | ICD-10-CM | POA: Diagnosis not present

## 2020-10-04 DIAGNOSIS — N2581 Secondary hyperparathyroidism of renal origin: Secondary | ICD-10-CM | POA: Diagnosis not present

## 2020-10-04 DIAGNOSIS — N186 End stage renal disease: Secondary | ICD-10-CM | POA: Diagnosis not present

## 2020-10-04 DIAGNOSIS — Z992 Dependence on renal dialysis: Secondary | ICD-10-CM | POA: Diagnosis not present

## 2020-10-07 DIAGNOSIS — N186 End stage renal disease: Secondary | ICD-10-CM | POA: Diagnosis not present

## 2020-10-07 DIAGNOSIS — Z992 Dependence on renal dialysis: Secondary | ICD-10-CM | POA: Diagnosis not present

## 2020-10-07 DIAGNOSIS — N2581 Secondary hyperparathyroidism of renal origin: Secondary | ICD-10-CM | POA: Diagnosis not present

## 2020-10-09 ENCOUNTER — Other Ambulatory Visit: Payer: Self-pay | Admitting: Cardiovascular Disease

## 2020-10-09 DIAGNOSIS — N2581 Secondary hyperparathyroidism of renal origin: Secondary | ICD-10-CM | POA: Diagnosis not present

## 2020-10-09 DIAGNOSIS — N186 End stage renal disease: Secondary | ICD-10-CM | POA: Diagnosis not present

## 2020-10-09 DIAGNOSIS — Z992 Dependence on renal dialysis: Secondary | ICD-10-CM | POA: Diagnosis not present

## 2020-10-11 DIAGNOSIS — N2581 Secondary hyperparathyroidism of renal origin: Secondary | ICD-10-CM | POA: Diagnosis not present

## 2020-10-11 DIAGNOSIS — Z992 Dependence on renal dialysis: Secondary | ICD-10-CM | POA: Diagnosis not present

## 2020-10-11 DIAGNOSIS — N186 End stage renal disease: Secondary | ICD-10-CM | POA: Diagnosis not present

## 2020-10-14 DIAGNOSIS — N2581 Secondary hyperparathyroidism of renal origin: Secondary | ICD-10-CM | POA: Diagnosis not present

## 2020-10-14 DIAGNOSIS — N186 End stage renal disease: Secondary | ICD-10-CM | POA: Diagnosis not present

## 2020-10-14 DIAGNOSIS — Z992 Dependence on renal dialysis: Secondary | ICD-10-CM | POA: Diagnosis not present

## 2020-10-16 DIAGNOSIS — N186 End stage renal disease: Secondary | ICD-10-CM | POA: Diagnosis not present

## 2020-10-16 DIAGNOSIS — Z992 Dependence on renal dialysis: Secondary | ICD-10-CM | POA: Diagnosis not present

## 2020-10-16 DIAGNOSIS — N2581 Secondary hyperparathyroidism of renal origin: Secondary | ICD-10-CM | POA: Diagnosis not present

## 2020-10-18 DIAGNOSIS — N186 End stage renal disease: Secondary | ICD-10-CM | POA: Diagnosis not present

## 2020-10-18 DIAGNOSIS — Z992 Dependence on renal dialysis: Secondary | ICD-10-CM | POA: Diagnosis not present

## 2020-10-18 DIAGNOSIS — N2581 Secondary hyperparathyroidism of renal origin: Secondary | ICD-10-CM | POA: Diagnosis not present

## 2020-10-21 DIAGNOSIS — N2581 Secondary hyperparathyroidism of renal origin: Secondary | ICD-10-CM | POA: Diagnosis not present

## 2020-10-21 DIAGNOSIS — Z992 Dependence on renal dialysis: Secondary | ICD-10-CM | POA: Diagnosis not present

## 2020-10-21 DIAGNOSIS — N186 End stage renal disease: Secondary | ICD-10-CM | POA: Diagnosis not present

## 2020-10-23 DIAGNOSIS — N186 End stage renal disease: Secondary | ICD-10-CM | POA: Diagnosis not present

## 2020-10-23 DIAGNOSIS — N2581 Secondary hyperparathyroidism of renal origin: Secondary | ICD-10-CM | POA: Diagnosis not present

## 2020-10-23 DIAGNOSIS — Z992 Dependence on renal dialysis: Secondary | ICD-10-CM | POA: Diagnosis not present

## 2020-10-25 DIAGNOSIS — N2581 Secondary hyperparathyroidism of renal origin: Secondary | ICD-10-CM | POA: Diagnosis not present

## 2020-10-25 DIAGNOSIS — Z992 Dependence on renal dialysis: Secondary | ICD-10-CM | POA: Diagnosis not present

## 2020-10-25 DIAGNOSIS — N186 End stage renal disease: Secondary | ICD-10-CM | POA: Diagnosis not present

## 2020-10-28 DIAGNOSIS — E1129 Type 2 diabetes mellitus with other diabetic kidney complication: Secondary | ICD-10-CM | POA: Diagnosis not present

## 2020-10-28 DIAGNOSIS — N186 End stage renal disease: Secondary | ICD-10-CM | POA: Diagnosis not present

## 2020-10-28 DIAGNOSIS — Z992 Dependence on renal dialysis: Secondary | ICD-10-CM | POA: Diagnosis not present

## 2020-10-28 DIAGNOSIS — N2581 Secondary hyperparathyroidism of renal origin: Secondary | ICD-10-CM | POA: Diagnosis not present

## 2020-10-30 DIAGNOSIS — N186 End stage renal disease: Secondary | ICD-10-CM | POA: Diagnosis not present

## 2020-10-30 DIAGNOSIS — Z992 Dependence on renal dialysis: Secondary | ICD-10-CM | POA: Diagnosis not present

## 2020-10-30 DIAGNOSIS — N2581 Secondary hyperparathyroidism of renal origin: Secondary | ICD-10-CM | POA: Diagnosis not present

## 2020-11-01 DIAGNOSIS — Z992 Dependence on renal dialysis: Secondary | ICD-10-CM | POA: Diagnosis not present

## 2020-11-01 DIAGNOSIS — N2581 Secondary hyperparathyroidism of renal origin: Secondary | ICD-10-CM | POA: Diagnosis not present

## 2020-11-01 DIAGNOSIS — N186 End stage renal disease: Secondary | ICD-10-CM | POA: Diagnosis not present

## 2020-11-04 DIAGNOSIS — Z992 Dependence on renal dialysis: Secondary | ICD-10-CM | POA: Diagnosis not present

## 2020-11-04 DIAGNOSIS — N186 End stage renal disease: Secondary | ICD-10-CM | POA: Diagnosis not present

## 2020-11-04 DIAGNOSIS — N2581 Secondary hyperparathyroidism of renal origin: Secondary | ICD-10-CM | POA: Diagnosis not present

## 2020-11-06 DIAGNOSIS — N2581 Secondary hyperparathyroidism of renal origin: Secondary | ICD-10-CM | POA: Diagnosis not present

## 2020-11-06 DIAGNOSIS — Z992 Dependence on renal dialysis: Secondary | ICD-10-CM | POA: Diagnosis not present

## 2020-11-06 DIAGNOSIS — N186 End stage renal disease: Secondary | ICD-10-CM | POA: Diagnosis not present

## 2020-11-08 DIAGNOSIS — Z992 Dependence on renal dialysis: Secondary | ICD-10-CM | POA: Diagnosis not present

## 2020-11-08 DIAGNOSIS — N2581 Secondary hyperparathyroidism of renal origin: Secondary | ICD-10-CM | POA: Diagnosis not present

## 2020-11-08 DIAGNOSIS — N186 End stage renal disease: Secondary | ICD-10-CM | POA: Diagnosis not present

## 2020-11-11 DIAGNOSIS — N186 End stage renal disease: Secondary | ICD-10-CM | POA: Diagnosis not present

## 2020-11-11 DIAGNOSIS — N2581 Secondary hyperparathyroidism of renal origin: Secondary | ICD-10-CM | POA: Diagnosis not present

## 2020-11-11 DIAGNOSIS — Z992 Dependence on renal dialysis: Secondary | ICD-10-CM | POA: Diagnosis not present

## 2020-11-13 DIAGNOSIS — N186 End stage renal disease: Secondary | ICD-10-CM | POA: Diagnosis not present

## 2020-11-13 DIAGNOSIS — N2581 Secondary hyperparathyroidism of renal origin: Secondary | ICD-10-CM | POA: Diagnosis not present

## 2020-11-13 DIAGNOSIS — Z992 Dependence on renal dialysis: Secondary | ICD-10-CM | POA: Diagnosis not present

## 2020-11-14 DIAGNOSIS — H52223 Regular astigmatism, bilateral: Secondary | ICD-10-CM | POA: Diagnosis not present

## 2020-11-14 DIAGNOSIS — H16223 Keratoconjunctivitis sicca, not specified as Sjogren's, bilateral: Secondary | ICD-10-CM | POA: Diagnosis not present

## 2020-11-14 DIAGNOSIS — H524 Presbyopia: Secondary | ICD-10-CM | POA: Diagnosis not present

## 2020-11-14 DIAGNOSIS — H40023 Open angle with borderline findings, high risk, bilateral: Secondary | ICD-10-CM | POA: Diagnosis not present

## 2020-11-15 DIAGNOSIS — Z992 Dependence on renal dialysis: Secondary | ICD-10-CM | POA: Diagnosis not present

## 2020-11-15 DIAGNOSIS — N2581 Secondary hyperparathyroidism of renal origin: Secondary | ICD-10-CM | POA: Diagnosis not present

## 2020-11-15 DIAGNOSIS — N186 End stage renal disease: Secondary | ICD-10-CM | POA: Diagnosis not present

## 2020-11-18 DIAGNOSIS — N186 End stage renal disease: Secondary | ICD-10-CM | POA: Diagnosis not present

## 2020-11-18 DIAGNOSIS — Z992 Dependence on renal dialysis: Secondary | ICD-10-CM | POA: Diagnosis not present

## 2020-11-18 DIAGNOSIS — N2581 Secondary hyperparathyroidism of renal origin: Secondary | ICD-10-CM | POA: Diagnosis not present

## 2020-11-19 ENCOUNTER — Telehealth: Payer: Self-pay | Admitting: Cardiovascular Disease

## 2020-11-19 NOTE — Telephone Encounter (Signed)
Spoke to [place] with [facility] calling to verify Bystolic directions.Advised patient's last appointment 04/23/19.Directions in chart 5 mg daily. Called patient to schedule appointment no answer.No voice mail.

## 2020-11-19 NOTE — Telephone Encounter (Signed)
New Message;    Need to know the correct dose of pt's Bystolic please.

## 2020-11-20 DIAGNOSIS — N186 End stage renal disease: Secondary | ICD-10-CM | POA: Diagnosis not present

## 2020-11-20 DIAGNOSIS — Z992 Dependence on renal dialysis: Secondary | ICD-10-CM | POA: Diagnosis not present

## 2020-11-20 DIAGNOSIS — N2581 Secondary hyperparathyroidism of renal origin: Secondary | ICD-10-CM | POA: Diagnosis not present

## 2020-11-21 DIAGNOSIS — Z992 Dependence on renal dialysis: Secondary | ICD-10-CM | POA: Diagnosis not present

## 2020-11-21 DIAGNOSIS — I871 Compression of vein: Secondary | ICD-10-CM | POA: Diagnosis not present

## 2020-11-21 DIAGNOSIS — N186 End stage renal disease: Secondary | ICD-10-CM | POA: Diagnosis not present

## 2020-11-22 DIAGNOSIS — N186 End stage renal disease: Secondary | ICD-10-CM | POA: Diagnosis not present

## 2020-11-22 DIAGNOSIS — Z992 Dependence on renal dialysis: Secondary | ICD-10-CM | POA: Diagnosis not present

## 2020-11-22 DIAGNOSIS — N2581 Secondary hyperparathyroidism of renal origin: Secondary | ICD-10-CM | POA: Diagnosis not present

## 2020-11-25 DIAGNOSIS — N2581 Secondary hyperparathyroidism of renal origin: Secondary | ICD-10-CM | POA: Diagnosis not present

## 2020-11-25 DIAGNOSIS — Z992 Dependence on renal dialysis: Secondary | ICD-10-CM | POA: Diagnosis not present

## 2020-11-25 DIAGNOSIS — N186 End stage renal disease: Secondary | ICD-10-CM | POA: Diagnosis not present

## 2020-11-27 DIAGNOSIS — N2581 Secondary hyperparathyroidism of renal origin: Secondary | ICD-10-CM | POA: Diagnosis not present

## 2020-11-27 DIAGNOSIS — N186 End stage renal disease: Secondary | ICD-10-CM | POA: Diagnosis not present

## 2020-11-27 DIAGNOSIS — Z992 Dependence on renal dialysis: Secondary | ICD-10-CM | POA: Diagnosis not present

## 2020-11-28 DIAGNOSIS — Z992 Dependence on renal dialysis: Secondary | ICD-10-CM | POA: Diagnosis not present

## 2020-11-28 DIAGNOSIS — N186 End stage renal disease: Secondary | ICD-10-CM | POA: Diagnosis not present

## 2020-11-28 DIAGNOSIS — E1129 Type 2 diabetes mellitus with other diabetic kidney complication: Secondary | ICD-10-CM | POA: Diagnosis not present

## 2020-11-29 DIAGNOSIS — Z992 Dependence on renal dialysis: Secondary | ICD-10-CM | POA: Diagnosis not present

## 2020-11-29 DIAGNOSIS — N2581 Secondary hyperparathyroidism of renal origin: Secondary | ICD-10-CM | POA: Diagnosis not present

## 2020-11-29 DIAGNOSIS — N186 End stage renal disease: Secondary | ICD-10-CM | POA: Diagnosis not present

## 2020-12-02 DIAGNOSIS — Z992 Dependence on renal dialysis: Secondary | ICD-10-CM | POA: Diagnosis not present

## 2020-12-02 DIAGNOSIS — N2581 Secondary hyperparathyroidism of renal origin: Secondary | ICD-10-CM | POA: Diagnosis not present

## 2020-12-02 DIAGNOSIS — N186 End stage renal disease: Secondary | ICD-10-CM | POA: Diagnosis not present

## 2020-12-04 DIAGNOSIS — N2581 Secondary hyperparathyroidism of renal origin: Secondary | ICD-10-CM | POA: Diagnosis not present

## 2020-12-04 DIAGNOSIS — N186 End stage renal disease: Secondary | ICD-10-CM | POA: Diagnosis not present

## 2020-12-04 DIAGNOSIS — Z992 Dependence on renal dialysis: Secondary | ICD-10-CM | POA: Diagnosis not present

## 2020-12-06 DIAGNOSIS — N2581 Secondary hyperparathyroidism of renal origin: Secondary | ICD-10-CM | POA: Diagnosis not present

## 2020-12-06 DIAGNOSIS — N186 End stage renal disease: Secondary | ICD-10-CM | POA: Diagnosis not present

## 2020-12-06 DIAGNOSIS — Z992 Dependence on renal dialysis: Secondary | ICD-10-CM | POA: Diagnosis not present

## 2020-12-07 ENCOUNTER — Other Ambulatory Visit: Payer: Self-pay | Admitting: Internal Medicine

## 2020-12-09 DIAGNOSIS — N2581 Secondary hyperparathyroidism of renal origin: Secondary | ICD-10-CM | POA: Diagnosis not present

## 2020-12-09 DIAGNOSIS — N186 End stage renal disease: Secondary | ICD-10-CM | POA: Diagnosis not present

## 2020-12-09 DIAGNOSIS — Z992 Dependence on renal dialysis: Secondary | ICD-10-CM | POA: Diagnosis not present

## 2020-12-11 ENCOUNTER — Other Ambulatory Visit: Payer: Self-pay | Admitting: Cardiovascular Disease

## 2020-12-11 DIAGNOSIS — N186 End stage renal disease: Secondary | ICD-10-CM | POA: Diagnosis not present

## 2020-12-11 DIAGNOSIS — Z992 Dependence on renal dialysis: Secondary | ICD-10-CM | POA: Diagnosis not present

## 2020-12-11 DIAGNOSIS — N2581 Secondary hyperparathyroidism of renal origin: Secondary | ICD-10-CM | POA: Diagnosis not present

## 2020-12-12 ENCOUNTER — Other Ambulatory Visit: Payer: Self-pay | Admitting: Internal Medicine

## 2020-12-12 NOTE — Telephone Encounter (Signed)
Please refill as per office routine med refill policy (all routine meds refilled for 3 mo or monthly per pt preference up to one year from last visit, then month to month grace period for 3 mo, then further med refills will have to be denied)  

## 2020-12-13 DIAGNOSIS — N2581 Secondary hyperparathyroidism of renal origin: Secondary | ICD-10-CM | POA: Diagnosis not present

## 2020-12-13 DIAGNOSIS — Z992 Dependence on renal dialysis: Secondary | ICD-10-CM | POA: Diagnosis not present

## 2020-12-13 DIAGNOSIS — N186 End stage renal disease: Secondary | ICD-10-CM | POA: Diagnosis not present

## 2020-12-16 DIAGNOSIS — N2581 Secondary hyperparathyroidism of renal origin: Secondary | ICD-10-CM | POA: Diagnosis not present

## 2020-12-16 DIAGNOSIS — N186 End stage renal disease: Secondary | ICD-10-CM | POA: Diagnosis not present

## 2020-12-16 DIAGNOSIS — Z992 Dependence on renal dialysis: Secondary | ICD-10-CM | POA: Diagnosis not present

## 2020-12-18 DIAGNOSIS — N186 End stage renal disease: Secondary | ICD-10-CM | POA: Diagnosis not present

## 2020-12-18 DIAGNOSIS — N2581 Secondary hyperparathyroidism of renal origin: Secondary | ICD-10-CM | POA: Diagnosis not present

## 2020-12-18 DIAGNOSIS — Z992 Dependence on renal dialysis: Secondary | ICD-10-CM | POA: Diagnosis not present

## 2020-12-19 ENCOUNTER — Other Ambulatory Visit: Payer: Self-pay | Admitting: Cardiovascular Disease

## 2020-12-19 ENCOUNTER — Other Ambulatory Visit: Payer: Self-pay | Admitting: Internal Medicine

## 2020-12-19 NOTE — Telephone Encounter (Signed)
Please refill as per office routine med refill policy (all routine meds refilled for 3 mo or monthly per pt preference up to one year from last visit, then month to month grace period for 3 mo, then further med refills will have to be denied)  

## 2020-12-20 DIAGNOSIS — Z992 Dependence on renal dialysis: Secondary | ICD-10-CM | POA: Diagnosis not present

## 2020-12-20 DIAGNOSIS — N186 End stage renal disease: Secondary | ICD-10-CM | POA: Diagnosis not present

## 2020-12-20 DIAGNOSIS — N2581 Secondary hyperparathyroidism of renal origin: Secondary | ICD-10-CM | POA: Diagnosis not present

## 2020-12-23 DIAGNOSIS — N186 End stage renal disease: Secondary | ICD-10-CM | POA: Diagnosis not present

## 2020-12-23 DIAGNOSIS — Z992 Dependence on renal dialysis: Secondary | ICD-10-CM | POA: Diagnosis not present

## 2020-12-23 DIAGNOSIS — N2581 Secondary hyperparathyroidism of renal origin: Secondary | ICD-10-CM | POA: Diagnosis not present

## 2020-12-25 DIAGNOSIS — Z992 Dependence on renal dialysis: Secondary | ICD-10-CM | POA: Diagnosis not present

## 2020-12-25 DIAGNOSIS — N186 End stage renal disease: Secondary | ICD-10-CM | POA: Diagnosis not present

## 2020-12-25 DIAGNOSIS — N2581 Secondary hyperparathyroidism of renal origin: Secondary | ICD-10-CM | POA: Diagnosis not present

## 2020-12-27 DIAGNOSIS — N2581 Secondary hyperparathyroidism of renal origin: Secondary | ICD-10-CM | POA: Diagnosis not present

## 2020-12-27 DIAGNOSIS — Z992 Dependence on renal dialysis: Secondary | ICD-10-CM | POA: Diagnosis not present

## 2020-12-27 DIAGNOSIS — N186 End stage renal disease: Secondary | ICD-10-CM | POA: Diagnosis not present

## 2020-12-28 DIAGNOSIS — Z992 Dependence on renal dialysis: Secondary | ICD-10-CM | POA: Diagnosis not present

## 2020-12-28 DIAGNOSIS — E1129 Type 2 diabetes mellitus with other diabetic kidney complication: Secondary | ICD-10-CM | POA: Diagnosis not present

## 2020-12-28 DIAGNOSIS — N186 End stage renal disease: Secondary | ICD-10-CM | POA: Diagnosis not present

## 2020-12-30 DIAGNOSIS — N186 End stage renal disease: Secondary | ICD-10-CM | POA: Diagnosis not present

## 2020-12-30 DIAGNOSIS — N2581 Secondary hyperparathyroidism of renal origin: Secondary | ICD-10-CM | POA: Diagnosis not present

## 2020-12-30 DIAGNOSIS — Z992 Dependence on renal dialysis: Secondary | ICD-10-CM | POA: Diagnosis not present

## 2021-01-01 DIAGNOSIS — N186 End stage renal disease: Secondary | ICD-10-CM | POA: Diagnosis not present

## 2021-01-01 DIAGNOSIS — Z992 Dependence on renal dialysis: Secondary | ICD-10-CM | POA: Diagnosis not present

## 2021-01-01 DIAGNOSIS — N2581 Secondary hyperparathyroidism of renal origin: Secondary | ICD-10-CM | POA: Diagnosis not present

## 2021-01-03 DIAGNOSIS — N2581 Secondary hyperparathyroidism of renal origin: Secondary | ICD-10-CM | POA: Diagnosis not present

## 2021-01-03 DIAGNOSIS — N186 End stage renal disease: Secondary | ICD-10-CM | POA: Diagnosis not present

## 2021-01-03 DIAGNOSIS — Z992 Dependence on renal dialysis: Secondary | ICD-10-CM | POA: Diagnosis not present

## 2021-01-06 DIAGNOSIS — N2581 Secondary hyperparathyroidism of renal origin: Secondary | ICD-10-CM | POA: Diagnosis not present

## 2021-01-06 DIAGNOSIS — Z992 Dependence on renal dialysis: Secondary | ICD-10-CM | POA: Diagnosis not present

## 2021-01-06 DIAGNOSIS — N186 End stage renal disease: Secondary | ICD-10-CM | POA: Diagnosis not present

## 2021-01-08 DIAGNOSIS — N2581 Secondary hyperparathyroidism of renal origin: Secondary | ICD-10-CM | POA: Diagnosis not present

## 2021-01-08 DIAGNOSIS — N186 End stage renal disease: Secondary | ICD-10-CM | POA: Diagnosis not present

## 2021-01-08 DIAGNOSIS — Z992 Dependence on renal dialysis: Secondary | ICD-10-CM | POA: Diagnosis not present

## 2021-01-10 DIAGNOSIS — N186 End stage renal disease: Secondary | ICD-10-CM | POA: Diagnosis not present

## 2021-01-10 DIAGNOSIS — N2581 Secondary hyperparathyroidism of renal origin: Secondary | ICD-10-CM | POA: Diagnosis not present

## 2021-01-10 DIAGNOSIS — Z992 Dependence on renal dialysis: Secondary | ICD-10-CM | POA: Diagnosis not present

## 2021-01-13 DIAGNOSIS — E8809 Other disorders of plasma-protein metabolism, not elsewhere classified: Secondary | ICD-10-CM | POA: Insufficient documentation

## 2021-01-13 DIAGNOSIS — N186 End stage renal disease: Secondary | ICD-10-CM | POA: Diagnosis not present

## 2021-01-13 DIAGNOSIS — Z992 Dependence on renal dialysis: Secondary | ICD-10-CM | POA: Diagnosis not present

## 2021-01-13 DIAGNOSIS — N2581 Secondary hyperparathyroidism of renal origin: Secondary | ICD-10-CM | POA: Diagnosis not present

## 2021-01-14 ENCOUNTER — Ambulatory Visit: Payer: Medicare PPO | Admitting: Physician Assistant

## 2021-01-14 ENCOUNTER — Other Ambulatory Visit: Payer: Self-pay

## 2021-01-14 ENCOUNTER — Encounter: Payer: Self-pay | Admitting: Physician Assistant

## 2021-01-14 VITALS — BP 130/60 | HR 67 | Ht 60.0 in | Wt 134.2 lb

## 2021-01-14 DIAGNOSIS — E785 Hyperlipidemia, unspecified: Secondary | ICD-10-CM

## 2021-01-14 DIAGNOSIS — N186 End stage renal disease: Secondary | ICD-10-CM

## 2021-01-14 DIAGNOSIS — Z89512 Acquired absence of left leg below knee: Secondary | ICD-10-CM | POA: Diagnosis not present

## 2021-01-14 DIAGNOSIS — Z89511 Acquired absence of right leg below knee: Secondary | ICD-10-CM

## 2021-01-14 DIAGNOSIS — E119 Type 2 diabetes mellitus without complications: Secondary | ICD-10-CM

## 2021-01-14 DIAGNOSIS — Z992 Dependence on renal dialysis: Secondary | ICD-10-CM | POA: Diagnosis not present

## 2021-01-14 DIAGNOSIS — I2510 Atherosclerotic heart disease of native coronary artery without angina pectoris: Secondary | ICD-10-CM | POA: Diagnosis not present

## 2021-01-14 DIAGNOSIS — I10 Essential (primary) hypertension: Secondary | ICD-10-CM | POA: Diagnosis not present

## 2021-01-14 MED ORDER — CLOPIDOGREL BISULFATE 75 MG PO TABS: 75.0000 mg | ORAL_TABLET | Freq: Every day | ORAL | 3 refills | Status: DC

## 2021-01-14 NOTE — Progress Notes (Signed)
Cardiology Office Note:    Date:  01/16/2021   ID:  Carolyn Valenzuela, DOB Dec 03, 1950, MRN [id number]  PCP:  Biagio Borg, MD   [facility] Cardiologist:  Quay Burow, MD {  Referring MD: Biagio Borg, MD   Chief Complaint  Patient presents with  . Follow-up    Seen for Dr. Gwenlyn Found    History of Present Illness:    Carolyn Valenzuela is a 70 y.o. female with a hx of HTN, HLD, DM II, and ESRD on HD, s/p bilateral BKA and CAD.  Patient was originally referred to Dr. Gwenlyn Found for evaluation of PAD.  Lower extremity arterial Doppler did not show significant obstructive disease however she has diminished TBI bilaterally suggesting the possibility of poor healing as result of surgical procedure.  She ultimately underwent bilateral below-knee amputation by Dr. Doran Durand and Dr. Sharol Given.  Patient was admitted with NSTEMI in October 2017.  Cardiac catheterization performed on 06/30/2018 demonstrated 100% ostial OM2 occlusion, 99% ostial left circumflex lesion, 40% proximal left circumflex lesion, 60% mid left circumflex lesion, 70% ostial to proximal LAD lesion, 80% distal LAD lesion.  Given diffuse disease, patient was referred to CT surgery for consideration of bypass surgery.  Unfortunately she was turned down due to her comorbidities.  She was brought back to the Cath Lab on 07/02/2016 and underwent Synergy 2.5 x 16 mm DES to proximal left circumflex.  Postprocedure, patient was placed on aspirin and Plavix for minimum 12 months, however ideally indefinitely.  Echocardiogram obtained on 07/01/2016 showed EF 40 to 45%, hypokinesis of the lateral and inferolateral myocardium, severe AI, moderate to severe MR, PA peak pressure 51 mmHg.  Echocardiogram was repeated on 10/12/2017, this revealed EF of 55 to 60%, grade 1 DD, no significant AI or MR were noted.  Echocardiogram obtained on 05/25/2019 showed EF 60 to 70%, trivial MR.  Patient presents today for follow-up.  If she has been doing well without any  exertional chest discomfort or worsening dyspnea.  She is still on dialysis Monday Wednesday Friday.  Her nephrologist is Dr. Corliss Parish.  On exam, she appears to be euvolemic.  Last lipid panel was obtained in July 2021 that showed elevated cholesterol level, her Crestor was increased to 40 mg daily.  She is due for repeat lab by her PCP in July of this year.  Overall, she has been doing well and can follow-up in 1 year.   Past Medical History:  Diagnosis Date  . Allergic rhinitis, cause unspecified 12/08/2010  . ANEMIA-IRON DEFICIENCY 01/25/2008  . Aortic regurgitation   . CAD (coronary artery disease)    a. 07/2016 NSTEMI/PCI: LM nl, LAD 70ost, 24md, 80d, D1 40ost, LCX 99ost/p (2.5x16 Synergy DES - 2.75), 40/645mOm2 100 CTO, RCA  30p/m. Pt eval by CT surgery, not felt to be suitable candidate 2/2 comorbidities.  . CERVICAL RADICULOPATHY, LEFT 01/25/2008  . Chronic systolic CHF (congestive heart failure) ([facility]   a. 07/2016 Echo: EF 40-45%, diff HK, sev AI, mild MVP w/ mod to sev MR, PASP 5137m.  . Critical lower limb ischemia ([facility] [place] . DIABETES MELLITUS, UNCONTROLLED 05/21/2009  . Esophagitis   . ESRD on dialysis ([facility] . Foot ulcer ([facility]  right lateral malleolus  . HCAP (healthcare-associated pneumonia) 06/29/2016  . HYPERLIPIDEMIA 03/30/2007  . HYPERTENSION 01/25/2008  . Ischemic cardiomyopathy    a. 07/2016 Echo: EF 40-45%.  . Mitral regurgitation    a. 07/2016 Echo: Mild  MVP w/ mod to sev MR.  [name] PVD (peripheral vascular disease) ([place])    a. s/p bilat BKA  . SECONDARY HYPERPARATHYROIDISM 05/21/2009  . Severe aortic regurgitation    a. 07/2016 Echo: Severe AI.    Past Surgical History:  Procedure Laterality Date  . AMPUTATION Right 03/15/2014   Procedure: RIGHT  LEG  BELOW KNEE AMPUTATION ;  Surgeon: [name], MD;  Location: [facility];  Service: Orthopedics;  Laterality: Right;  . AMPUTATION Left 11/29/2015   Procedure: AMPUTATION BELOW KNEE;  Surgeon: Newt Minion,  MD;  Location: [facility];  Service: Orthopedics;  Laterality: Left;  . AV FISTULA PLACEMENT Left   . AV FISTULA PLACEMENT Left 09/06/2014   Procedure: ARTERIOVENOUS (AV) FISTULA CREATION-Left Brachiocephalic;  Surgeon: Conrad Harvey, MD;  Location: [facility];  Service: Vascular;  Laterality: Left;  . CARDIAC CATHETERIZATION N/A 06/30/2016   Procedure: Left Heart Cath and Coronary Angiography;  Surgeon: Burnell Blanks, MD;  Location: [facility];  Service: Cardiovascular;  Laterality: N/A;  . CARDIAC CATHETERIZATION N/A 07/02/2016   Procedure: Coronary Stent Intervention;  Surgeon: Nelva Bush, MD;  Location: [facility];  Service: Cardiovascular;  Laterality: N/A;  . CATARACT EXTRACTION    . COLONOSCOPY    . CORONARY ANGIOPLASTY WITH STENT PLACEMENT  07/02/2016  . ESOPHAGOGASTRODUODENOSCOPY N/A 03/20/2016   Procedure: ESOPHAGOGASTRODUODENOSCOPY (EGD);  Surgeon: Milus Banister, MD;  Location: [facility];  Service: Endoscopy;  Laterality: N/A;  . EYE SURGERY Bilateral    cataracts removed, left eye still has some oil in it.  . INSERTION OF DIALYSIS CATHETER N/A 09/03/2014   Procedure: INSERTION OF DIALYSIS CATHETER RIGHT INTERNAL JUGULAR VEIN;  Surgeon: Conrad Hardeman, MD;  Location: [facility];  Service: Vascular;  Laterality: N/A;  . PERIPHERAL VASCULAR CATHETERIZATION N/A 08/13/2015   Procedure: Abdominal Aortogram;  Surgeon: Elam Dutch, MD;  Location: [facility];  Service: Cardiovascular;  Laterality: N/A;    Current Medications: Current Meds  Medication Sig  . Accu-Chek Softclix Lancets lancets Use to check blood sugars 2x a day. DX: E11.9  . acetaminophen (TYLENOL) 500 MG tablet Take 500 mg by mouth every 6 (six) hours as needed for mild pain.  . ALPHAGAN P 0.1 % SOLN Apply 1 drop to eye 3 (three) times daily.  [name] amLODipine (NORVASC) 10 MG tablet TAKE 1 TABLET BY MOUTH EVERYDAY AT BEDTIME  . aspirin 81 MG EC tablet Take 81 mg by mouth daily.  . Blood Glucose  Monitoring Suppl (ACCU-CHEK GUIDE ME) w/Device KIT USE DEVICE TO CHECK BLOOD SUGARS 2X A DAY. DX: E11.9  . gabapentin (NEURONTIN) 100 MG capsule TAKE 2 CAPSULES (200 MG TOTAL) BY MOUTH 3 (THREE) TIMES DAILY.  [name] glipiZIDE (GLUCOTROL XL) 10 MG 24 hr tablet TAKE 1 TABLET (10 MG TOTAL) BY MOUTH DAILY WITH BREAKFAST.  [name] glucose blood test strip Use to check Blood Sugars 2x a day. DX: E11.9  . Influenza Vac High-Dose Quad (FLUZONE HIGH-DOSE QUADRIVALENT IM)   . JANUVIA 100 MG tablet TAKE 1 TABLET BY MOUTH EVERY DAY  . Methoxy PEG-Epoetin Beta (MIRCERA IJ) Mircera  . multivitamin (RENA-VIT) TABS tablet TAKE 1 TABLET BY MOUTH EVERYDAY AT BEDTIME  . nebivolol (BYSTOLIC) 5 MG tablet Take 1 tablet (5 mg total) by mouth daily.  . pantoprazole (PROTONIX) 40 MG tablet TAKE 1 TABLET BY MOUTH TWICE A DAY  . RESTASIS 0.05 % ophthalmic emulsion 1 drop 2 (two) times daily.  . rosuvastatin (CRESTOR)  40 MG tablet TAKE 1 TABLET BY MOUTH EVERY DAY  . VELPHORO 500 MG chewable tablet   . [DISCONTINUED] clopidogrel (PLAVIX) 75 MG tablet Take 1 tablet (75 mg total) by mouth daily. NEED OV.   Current Facility-Administered Medications for the 01/14/21 encounter (Office Visit) with Almyra Deforest, PA  Medication  . triamcinolone acetonide (TRIESENCE) 40 MG/ML subtenons injection 4 mg     Allergies:   Patient has no known allergies.   Social History   Socioeconomic History  . Marital status: Single    Spouse name: Not on file  . Number of children: Not on file  . Years of education: Not on file  . Highest education level: Not on file  Occupational History  . Occupation: Conservation officer, historic buildings: [employer]  Tobacco Use  . Smoking status: Never Smoker  . Smokeless tobacco: Never Used  Vaping Use  . Vaping Use: Never used  Substance and Sexual Activity  . Alcohol use: No    Alcohol/week: 0.0 standard drinks  . Drug use: No  . Sexual activity: Not on file  Other Topics Concern  . Not on file  Social  History Narrative  . Not on file   Social Determinants of Health   Financial Resource Strain: Not on file  Food Insecurity: Not on file  Transportation Needs: Not on file  Physical Activity: Not on file  Stress: Not on file  Social Connections: Not on file     Family History: The patient's family history includes Cancer in her mother.  ROS:   Please see the history of present illness.     All other systems reviewed and are negative.  EKGs/Labs/Other Studies Reviewed:    The following studies were reviewed today:  Echo 05/25/2019 1. Left ventricular ejection fraction, by visual estimation, is 65 to  70%. The left ventricle has normal function. Normal left ventricular size.  Left ventricular septal wall thickness was normal. Normal left ventricular  posterior wall thickness. There  is no left ventricular hypertrophy.  2. Left ventricular diastolic Doppler parameters are consistent with  impaired relaxation pattern of LV diastolic filling.  3. Global right ventricle has normal systolic function.The right  ventricular size is normal. No increase in right ventricular wall  thickness.  4. The mitral valve is grossly normal. Trace mitral valve regurgitation.   In comparison to the previous echocardiogram(s): 10/12/17 EF 55-60%.   EKG:  EKG is ordered today.  The ekg ordered today demonstrates normal sinus rhythm, no significant ST-T wave changes.  Recent Labs: 03/12/2020: ALT 8; BUN 28; Creat 6.64; Potassium 4.6; Sodium 137  Recent Lipid Panel    Component Value Date/Time   CHOL 216 (H) 03/12/2020 1058   TRIG 198 (H) 03/12/2020 1058   HDL 44 (L) 03/12/2020 1058   CHOLHDL 4.9 03/12/2020 1058   VLDL 26.0 09/07/2019 1159   LDLCALC 138 (H) 03/12/2020 1058   LDLDIRECT 86.0 04/06/2017 1131     Risk Assessment/Calculations:       Physical Exam:    VS:  BP 130/60   Pulse 67   Ht 5' (1.524 m)   Wt 134 lb 3.2 oz (60.9 kg)   SpO2 98%   BMI 26.21 kg/m     Wt  Readings from Last 3 Encounters:  01/14/21 134 lb 3.2 oz (60.9 kg)  03/12/20 132 lb (59.9 kg)  09/07/19 133 lb (60.3 kg)     GEN:  Well nourished, well developed in no acute distress HEENT:  Normal NECK: No JVD; No carotid bruits LYMPHATICS: No lymphadenopathy CARDIAC: RRR, no murmurs, rubs, gallops RESPIRATORY:  Clear to auscultation without rales, wheezing or rhonchi  ABDOMEN: Soft, non-tender, non-distended MUSCULOSKELETAL: Bilateral BKA SKIN: Warm and dry NEUROLOGIC:  Alert and oriented x 3 PSYCHIATRIC:  Normal affect   ASSESSMENT:    1. Coronary artery disease involving native coronary artery of native heart without angina pectoris   2. Essential hypertension   3. Hyperlipidemia LDL goal <70   4. Controlled type 2 diabetes mellitus without complication, without long-term current use of insulin ([place])   5. ESRD on hemodialysis ([place])   6. S/P bilateral BKA (below knee amputation) ([place])    PLAN:    In order of problems listed above:  1. CAD: Denies any chest pain.  Continue aspirin and Plavix  2. Hypertension: Blood pressure stable  3. Hyperlipidemia: Crestor was previously increased to 40 mg daily due to uncontrolled cholesterol.  She is due for repeat blood work by her PCP  4. DM2: Managed by primary care provider  5. End-stage renal disease on hemodialysis: Followed by Dr. Corliss Parish  6. History of bilateral BKA: No acute issues.        Medication Adjustments/Labs and Tests Ordered: Current medicines are reviewed at length with the patient today.  Concerns regarding medicines are outlined above.  Orders Placed This Encounter  Procedures  . EKG 12-Lead   Meds ordered this encounter  Medications  . clopidogrel (PLAVIX) 75 MG tablet    Sig: Take 1 tablet (75 mg total) by mouth daily.    Dispense:  90 tablet    Refill:  3    Patient Instructions  Medication Instructions:  Continue current medications  *If you need a refill on your cardiac  medications before your next appointment, please call your pharmacy*   Lab Work: None Ordered   Testing/Procedures: None Ordered   Follow-Up: At [facility], you and your health needs are our priority.  As part of our continuing mission to provide you with exceptional heart care, we have created designated Provider Care Teams.  These Care Teams include your primary Cardiologist (physician) and Advanced Practice Providers (APPs -  Physician Assistants and Nurse Practitioners) who all work together to provide you with the care you need, when you need it.  We recommend signing up for the patient portal called "MyChart".  Sign up information is provided on this After Visit Summary.  MyChart is used to connect with patients for Virtual Visits (Telemedicine).  Patients are able to view lab/test results, encounter notes, upcoming appointments, etc.  Non-urgent messages can be sent to your provider as well.   To learn more about what you can do with MyChart, go to [email or web address].    Your next appointment:   1 year(s)   The format for your next appointment:   In Person  Provider:   You may see Quay Burow, MD or one of the following Advanced Practice Providers on your designated Care Team:    Sande Rives, PA-C  Coletta Memos, FNP         Signed, Almyra Deforest, [place]  01/16/2021 10:11 PM    [place]

## 2021-01-14 NOTE — Patient Instructions (Signed)
Medication Instructions:  Continue current medications  *If you need a refill on your cardiac medications before your next appointment, please call your pharmacy*   Lab Work: None Ordered   Testing/Procedures: None Ordered   Follow-Up: At [facility], you and your health needs are our priority.  As part of our continuing mission to provide you with exceptional heart care, we have created designated Provider Care Teams.  These Care Teams include your primary Cardiologist (physician) and Advanced Practice Providers (APPs -  Physician Assistants and Nurse Practitioners) who all work together to provide you with the care you need, when you need it.  We recommend signing up for the patient portal called "MyChart".  Sign up information is provided on this After Visit Summary.  MyChart is used to connect with patients for Virtual Visits (Telemedicine).  Patients are able to view lab/test results, encounter notes, upcoming appointments, etc.  Non-urgent messages can be sent to your provider as well.   To learn more about what you can do with MyChart, go to [email or web address].    Your next appointment:   1 year(s)   The format for your next appointment:   In Person  Provider:   You may see Quay Burow, MD or one of the following Advanced Practice Providers on your designated Care Team:    [place], PA-C  Coletta Memos, FNP

## 2021-01-15 DIAGNOSIS — N2581 Secondary hyperparathyroidism of renal origin: Secondary | ICD-10-CM | POA: Diagnosis not present

## 2021-01-15 DIAGNOSIS — N186 End stage renal disease: Secondary | ICD-10-CM | POA: Diagnosis not present

## 2021-01-15 DIAGNOSIS — Z992 Dependence on renal dialysis: Secondary | ICD-10-CM | POA: Diagnosis not present

## 2021-01-16 ENCOUNTER — Encounter: Payer: Self-pay | Admitting: Physician Assistant

## 2021-01-17 DIAGNOSIS — N186 End stage renal disease: Secondary | ICD-10-CM | POA: Diagnosis not present

## 2021-01-17 DIAGNOSIS — Z992 Dependence on renal dialysis: Secondary | ICD-10-CM | POA: Diagnosis not present

## 2021-01-17 DIAGNOSIS — N2581 Secondary hyperparathyroidism of renal origin: Secondary | ICD-10-CM | POA: Diagnosis not present

## 2021-01-20 DIAGNOSIS — N2581 Secondary hyperparathyroidism of renal origin: Secondary | ICD-10-CM | POA: Diagnosis not present

## 2021-01-20 DIAGNOSIS — Z992 Dependence on renal dialysis: Secondary | ICD-10-CM | POA: Diagnosis not present

## 2021-01-20 DIAGNOSIS — N186 End stage renal disease: Secondary | ICD-10-CM | POA: Diagnosis not present

## 2021-01-22 DIAGNOSIS — Z992 Dependence on renal dialysis: Secondary | ICD-10-CM | POA: Diagnosis not present

## 2021-01-22 DIAGNOSIS — N2581 Secondary hyperparathyroidism of renal origin: Secondary | ICD-10-CM | POA: Diagnosis not present

## 2021-01-22 DIAGNOSIS — N186 End stage renal disease: Secondary | ICD-10-CM | POA: Diagnosis not present

## 2021-01-24 DIAGNOSIS — Z992 Dependence on renal dialysis: Secondary | ICD-10-CM | POA: Diagnosis not present

## 2021-01-24 DIAGNOSIS — N186 End stage renal disease: Secondary | ICD-10-CM | POA: Diagnosis not present

## 2021-01-24 DIAGNOSIS — N2581 Secondary hyperparathyroidism of renal origin: Secondary | ICD-10-CM | POA: Diagnosis not present

## 2021-01-27 DIAGNOSIS — N2581 Secondary hyperparathyroidism of renal origin: Secondary | ICD-10-CM | POA: Diagnosis not present

## 2021-01-27 DIAGNOSIS — N186 End stage renal disease: Secondary | ICD-10-CM | POA: Diagnosis not present

## 2021-01-27 DIAGNOSIS — Z992 Dependence on renal dialysis: Secondary | ICD-10-CM | POA: Diagnosis not present

## 2021-01-28 DIAGNOSIS — N186 End stage renal disease: Secondary | ICD-10-CM | POA: Diagnosis not present

## 2021-01-28 DIAGNOSIS — E1129 Type 2 diabetes mellitus with other diabetic kidney complication: Secondary | ICD-10-CM | POA: Diagnosis not present

## 2021-01-28 DIAGNOSIS — Z992 Dependence on renal dialysis: Secondary | ICD-10-CM | POA: Diagnosis not present

## 2021-01-29 DIAGNOSIS — N186 End stage renal disease: Secondary | ICD-10-CM | POA: Diagnosis not present

## 2021-01-29 DIAGNOSIS — Z992 Dependence on renal dialysis: Secondary | ICD-10-CM | POA: Diagnosis not present

## 2021-01-29 DIAGNOSIS — N2581 Secondary hyperparathyroidism of renal origin: Secondary | ICD-10-CM | POA: Diagnosis not present

## 2021-01-31 DIAGNOSIS — N186 End stage renal disease: Secondary | ICD-10-CM | POA: Diagnosis not present

## 2021-01-31 DIAGNOSIS — N2581 Secondary hyperparathyroidism of renal origin: Secondary | ICD-10-CM | POA: Diagnosis not present

## 2021-01-31 DIAGNOSIS — Z992 Dependence on renal dialysis: Secondary | ICD-10-CM | POA: Diagnosis not present

## 2021-02-02 ENCOUNTER — Other Ambulatory Visit: Payer: Self-pay | Admitting: Internal Medicine

## 2021-02-03 DIAGNOSIS — Z992 Dependence on renal dialysis: Secondary | ICD-10-CM | POA: Diagnosis not present

## 2021-02-03 DIAGNOSIS — N186 End stage renal disease: Secondary | ICD-10-CM | POA: Diagnosis not present

## 2021-02-03 DIAGNOSIS — N2581 Secondary hyperparathyroidism of renal origin: Secondary | ICD-10-CM | POA: Diagnosis not present

## 2021-02-05 DIAGNOSIS — N186 End stage renal disease: Secondary | ICD-10-CM | POA: Diagnosis not present

## 2021-02-05 DIAGNOSIS — N2581 Secondary hyperparathyroidism of renal origin: Secondary | ICD-10-CM | POA: Diagnosis not present

## 2021-02-05 DIAGNOSIS — Z992 Dependence on renal dialysis: Secondary | ICD-10-CM | POA: Diagnosis not present

## 2021-02-07 DIAGNOSIS — N186 End stage renal disease: Secondary | ICD-10-CM | POA: Diagnosis not present

## 2021-02-07 DIAGNOSIS — Z992 Dependence on renal dialysis: Secondary | ICD-10-CM | POA: Diagnosis not present

## 2021-02-07 DIAGNOSIS — N2581 Secondary hyperparathyroidism of renal origin: Secondary | ICD-10-CM | POA: Diagnosis not present

## 2021-02-10 DIAGNOSIS — Z992 Dependence on renal dialysis: Secondary | ICD-10-CM | POA: Diagnosis not present

## 2021-02-10 DIAGNOSIS — N186 End stage renal disease: Secondary | ICD-10-CM | POA: Diagnosis not present

## 2021-02-10 DIAGNOSIS — N2581 Secondary hyperparathyroidism of renal origin: Secondary | ICD-10-CM | POA: Diagnosis not present

## 2021-02-12 DIAGNOSIS — Z992 Dependence on renal dialysis: Secondary | ICD-10-CM | POA: Diagnosis not present

## 2021-02-12 DIAGNOSIS — N186 End stage renal disease: Secondary | ICD-10-CM | POA: Diagnosis not present

## 2021-02-12 DIAGNOSIS — N2581 Secondary hyperparathyroidism of renal origin: Secondary | ICD-10-CM | POA: Diagnosis not present

## 2021-02-13 ENCOUNTER — Encounter: Payer: Self-pay | Admitting: Internal Medicine

## 2021-02-13 ENCOUNTER — Other Ambulatory Visit: Payer: Self-pay

## 2021-02-13 ENCOUNTER — Telehealth: Payer: Self-pay | Admitting: *Deleted

## 2021-02-13 ENCOUNTER — Ambulatory Visit (INDEPENDENT_AMBULATORY_CARE_PROVIDER_SITE_OTHER): Payer: Medicare PPO | Admitting: Internal Medicine

## 2021-02-13 VITALS — BP 150/76 | HR 64 | Temp 98.1°F | Ht 60.0 in | Wt 136.6 lb

## 2021-02-13 DIAGNOSIS — N186 End stage renal disease: Secondary | ICD-10-CM | POA: Diagnosis not present

## 2021-02-13 DIAGNOSIS — I739 Peripheral vascular disease, unspecified: Secondary | ICD-10-CM

## 2021-02-13 DIAGNOSIS — M25511 Pain in right shoulder: Secondary | ICD-10-CM | POA: Diagnosis not present

## 2021-02-13 DIAGNOSIS — N6310 Unspecified lump in the right breast, unspecified quadrant: Secondary | ICD-10-CM

## 2021-02-13 DIAGNOSIS — R0602 Shortness of breath: Secondary | ICD-10-CM

## 2021-02-13 DIAGNOSIS — Z1211 Encounter for screening for malignant neoplasm of colon: Secondary | ICD-10-CM | POA: Diagnosis not present

## 2021-02-13 DIAGNOSIS — E1152 Type 2 diabetes mellitus with diabetic peripheral angiopathy with gangrene: Secondary | ICD-10-CM

## 2021-02-13 DIAGNOSIS — E7800 Pure hypercholesterolemia, unspecified: Secondary | ICD-10-CM | POA: Diagnosis not present

## 2021-02-13 DIAGNOSIS — Z992 Dependence on renal dialysis: Secondary | ICD-10-CM

## 2021-02-13 DIAGNOSIS — R269 Unspecified abnormalities of gait and mobility: Secondary | ICD-10-CM | POA: Diagnosis not present

## 2021-02-13 DIAGNOSIS — M79644 Pain in right finger(s): Secondary | ICD-10-CM | POA: Diagnosis not present

## 2021-02-13 DIAGNOSIS — E559 Vitamin D deficiency, unspecified: Secondary | ICD-10-CM

## 2021-02-13 DIAGNOSIS — E538 Deficiency of other specified B group vitamins: Secondary | ICD-10-CM

## 2021-02-13 DIAGNOSIS — I10 Essential (primary) hypertension: Secondary | ICD-10-CM

## 2021-02-13 DIAGNOSIS — Z0001 Encounter for general adult medical examination with abnormal findings: Secondary | ICD-10-CM

## 2021-02-13 LAB — CBC WITH DIFFERENTIAL/PLATELET
Basophils Absolute: 0.1 10*3/uL (ref 0.0–0.1)
Basophils Relative: 1.7 % (ref 0.0–3.0)
Eosinophils Absolute: 0.1 10*3/uL (ref 0.0–0.7)
Eosinophils Relative: 1.3 % (ref 0.0–5.0)
HCT: 30.1 % — ABNORMAL LOW (ref 36.0–46.0)
Hemoglobin: 10 g/dL — ABNORMAL LOW (ref 12.0–15.0)
Lymphocytes Relative: 27.6 % (ref 12.0–46.0)
Lymphs Abs: 1.9 10*3/uL (ref 0.7–4.0)
MCHC: 33.3 g/dL (ref 30.0–36.0)
MCV: 97 fl (ref 78.0–100.0)
Monocytes Absolute: 0.7 10*3/uL (ref 0.1–1.0)
Monocytes Relative: 10.7 % (ref 3.0–12.0)
Neutro Abs: 4 10*3/uL (ref 1.4–7.7)
Neutrophils Relative %: 58.7 % (ref 43.0–77.0)
Platelets: 222 10*3/uL (ref 150.0–400.0)
RBC: 3.11 Mil/uL — ABNORMAL LOW (ref 3.87–5.11)
RDW: 18.9 % — ABNORMAL HIGH (ref 11.5–15.5)
WBC: 6.7 10*3/uL (ref 4.0–10.5)

## 2021-02-13 LAB — BASIC METABOLIC PANEL
BUN: 13 mg/dL (ref 6–23)
CO2: 34 mEq/L — ABNORMAL HIGH (ref 19–32)
Calcium: 8.7 mg/dL (ref 8.4–10.5)
Chloride: 94 mEq/L — ABNORMAL LOW (ref 96–112)
Creatinine, Ser: 4.92 mg/dL (ref 0.40–1.20)
GFR: 8.54 mL/min — CL (ref >60.00)
Glucose, Bld: 89 mg/dL (ref 70–99)
Potassium: 3.6 mEq/L (ref 3.5–5.1)
Sodium: 139 mEq/L (ref 135–145)

## 2021-02-13 LAB — HEPATIC FUNCTION PANEL
ALT: 9 U/L (ref 0–35)
AST: 18 U/L (ref 0–37)
Albumin: 4 g/dL (ref 3.5–5.2)
Alkaline Phosphatase: 68 U/L (ref 39–117)
Bilirubin, Direct: 0.1 mg/dL (ref 0.0–0.3)
Total Bilirubin: 0.5 mg/dL (ref 0.2–1.2)
Total Protein: 7.1 g/dL (ref 6.0–8.3)

## 2021-02-13 LAB — LIPID PANEL
Cholesterol: 82 mg/dL (ref 0–200)
HDL: 39.8 mg/dL (ref >39.00)
LDL Cholesterol: 30 mg/dL (ref 0–99)
NonHDL: 41.91
Total CHOL/HDL Ratio: 2
Triglycerides: 59 mg/dL (ref 0.0–149.0)
VLDL: 11.8 mg/dL (ref 0.0–40.0)

## 2021-02-13 LAB — VITAMIN B12: Vitamin B-12: >1550 pg/mL — ABNORMAL HIGH (ref 211–911)

## 2021-02-13 LAB — TSH: TSH: 1.4 u[IU]/mL (ref 0.35–4.50)

## 2021-02-13 LAB — VITAMIN D 25 HYDROXY (VIT D DEFICIENCY, FRACTURES): VITD: 93.12 ng/mL (ref 30.00–100.00)

## 2021-02-13 LAB — HEMOGLOBIN A1C: Hgb A1c MFr Bld: 6.4 % (ref 4.6–6.5)

## 2021-02-13 MED ORDER — ALBUTEROL SULFATE HFA 108 (90 BASE) MCG/ACT IN AERS: 2.0000 | INHALATION_SPRAY | Freq: Four times a day (QID) | RESPIRATORY_TRACT | 5 refills | Status: AC | PRN

## 2021-02-13 MED ORDER — DOXYCYCLINE HYCLATE 100 MG PO TABS: 100.0000 mg | ORAL_TABLET | Freq: Two times a day (BID) | ORAL | 0 refills | Status: DC

## 2021-02-13 NOTE — Progress Notes (Signed)
Patient ID: Carolyn Valenzuela, female   DOB: 10-16-1950, 70 y.o.   MRN: [id number]         Chief Complaint:: wellness exam and Hand Pain  /finger pain, as well as dyspnea, gait d/o, right breast lump, right shoulder pain,       HPI:  Carolyn Valenzuela is a 70 y.o. female here for wellness exam; due for mamogram and colonoscopy, o/w up to date with preventive referrals and immunizations                        Also conts with HD on M-W-F; has mild dypsnea and sisters inhaler helps; also with worsening generalized weakness and gait difficulty but no falls; also c/o right breast lump in last few months mild tender without overlying skin change, fever or drainage.  Also has 1 mo worsening right shoulder pain sore and tender, mild to mod, worse to abduct, better to keep the arm down, nothing else makes better or worse except to lie on right side at night.  Also has a tender sore scabbed lesion with some redness to the pad of the right index finger for approx 2 wks just not getting better.  No fever.  Pt denies chest pain, orthopnea, PND, increased LE swelling, palpitations, dizziness or syncope.  Bp usually controlled approx 140/90   Wt Readings from Last 3 Encounters:  02/13/21 136 lb 9.6 oz (62 kg)  01/14/21 134 lb 3.2 oz (60.9 kg)  03/12/20 132 lb (59.9 kg)   BP Readings from Last 3 Encounters:  02/13/21 (!) 150/76  01/14/21 130/60  03/12/20 120/60   Immunization History  Administered Date(s) Administered   Influenza Whole 06/03/2009   Influenza, High Dose Seasonal PF 05/25/2018, 05/24/2019   Influenza,inj,Quad PF,6+ Mos 07/06/2014   Influenza-Unspecified 07/01/2017   PFIZER(Purple Top)SARS-COV-2 Vaccination 10/30/2019, 11/22/2019, 07/09/2020, 12/27/2020   Pneumococcal Conjugate-13 07/20/2014, 10/01/2017   Pneumococcal Polysaccharide-23 09/05/2009, 05/28/2015, 06/03/2018   Td 09/05/2009   Health Maintenance Due  Topic Date Due   MAMMOGRAM  12/31/2007   COLONOSCOPY (Pts 45-65yr Insurance  coverage will need to be confirmed)  04/15/2016      Past Medical History:  Diagnosis Date   Allergic rhinitis, cause unspecified 12/08/2010   ANEMIA-IRON DEFICIENCY 01/25/2008   Aortic regurgitation    CAD (coronary artery disease)    a. 07/2016 NSTEMI/PCI: LM nl, LAD 70ost, 459m, 80d, D1 40ost, LCX 99ost/p (2.5x16 Synergy DES - 2.75), 40/6060mm2 100 CTO, RCA  30p/m. Pt eval by CT surgery, not felt to be suitable candidate 2/2 comorbidities.   CERVICAL RADICULOPATHY, LEFT 01/19/98/3716Chronic systolic CHF (congestive heart failure) ([facility]  a. 07/2016 Echo: EF 40-45%, diff HK, sev AI, mild MVP w/ mod to sev MR, PASP 99m77m   Critical lower limb ischemia (HCC)[place] DIABETES MELLITUS, UNCONTROLLED 05/21/2009   Esophagitis    [facility] Foot ulcer (H[facility])[place] right lateral malleolus   HCAP (healthcare-associated pneumonia) 06/29/2016   HYPERLIPIDEMIA 03/30/2007   HYPERTENSION 01/25/2008   Ischemic cardiomyopathy    a. 07/2016 Echo: EF 40-45%.   Mitral regurgitation    a. 07/2016 Echo: Mild MVP w/ mod to sev MR.   PVD (peripheral vascular disease) (HCC)[place] a. s/p bilat BKA   SECONDARY HYPERPARATHYROIDISM 05/21/2009   Severe aortic regurgitation    a. 07/2016 Echo: Severe AI.   Past Surgical History:  Procedure Laterality Date  AMPUTATION Right 03/15/2014   Procedure: RIGHT  LEG  BELOW KNEE AMPUTATION ;  Surgeon: [name], MD;  Location: [facility];  Service: Orthopedics;  Laterality: Right;   AMPUTATION Left 11/29/2015   Procedure: AMPUTATION BELOW KNEE;  Surgeon: Newt Minion, MD;  Location: [facility];  Service: Orthopedics;  Laterality: Left;   AV FISTULA PLACEMENT Left    AV FISTULA PLACEMENT Left 09/06/2014   Procedure: ARTERIOVENOUS (AV) FISTULA CREATION-Left Brachiocephalic;  Surgeon: Conrad Ravenna, MD;  Location: [facility];  Service: Vascular;  Laterality: Left;   CARDIAC CATHETERIZATION N/A 06/30/2016   Procedure: Left Heart Cath and Coronary Angiography;  Surgeon: Burnell Blanks, MD;  Location: [facility];  Service: Cardiovascular;  Laterality: N/A;   CARDIAC CATHETERIZATION N/A 07/02/2016   Procedure: Coronary Stent Intervention;  Surgeon: Nelva Bush, MD;  Location: [facility];  Service: Cardiovascular;  Laterality: N/A;   CATARACT EXTRACTION     COLONOSCOPY     CORONARY ANGIOPLASTY WITH STENT PLACEMENT  07/02/2016   ESOPHAGOGASTRODUODENOSCOPY N/A 03/20/2016   Procedure: ESOPHAGOGASTRODUODENOSCOPY (EGD);  Surgeon: Milus Banister, MD;  Location: [facility];  Service: Endoscopy;  Laterality: N/A;   EYE SURGERY Bilateral    cataracts removed, left eye still has some oil in it.   INSERTION OF DIALYSIS CATHETER N/A 09/03/2014   Procedure: INSERTION OF DIALYSIS CATHETER RIGHT INTERNAL JUGULAR VEIN;  Surgeon: Conrad , MD;  Location: [facility];  Service: Vascular;  Laterality: N/A;   PERIPHERAL VASCULAR CATHETERIZATION N/A 08/13/2015   Procedure: Abdominal Aortogram;  Surgeon: Elam Dutch, MD;  Location: [place] [facility];  Service: Cardiovascular;  Laterality: N/A;    reports that she has never smoked. She has never used smokeless tobacco. She reports that she does not drink alcohol and does not use drugs. family history includes Cancer in her mother. Allergies  Allergen Reactions   Oxycodone Itching   Current Outpatient Medications on File Prior to Visit  Medication Sig Dispense Refill   Accu-Chek Softclix Lancets lancets Use to check blood sugars 2x a day. DX: E11.9 100 each 12   acetaminophen (TYLENOL) 500 MG tablet Take 500 mg by mouth every 6 (six) hours as needed for mild pain.     ALPHAGAN P 0.1 % SOLN Apply 1 drop to eye 3 (three) times daily.     amLODipine (NORVASC) 10 MG tablet TAKE 1 TABLET BY MOUTH EVERYDAY AT BEDTIME 90 tablet 3   aspirin 81 MG EC tablet Take 81 mg by mouth daily.     Blood Glucose Monitoring Suppl (ACCU-CHEK GUIDE ME) w/Device KIT USE DEVICE TO CHECK BLOOD SUGARS 2X A DAY. DX: E11.9 1 kit 0    clopidogrel (PLAVIX) 75 MG tablet Take 1 tablet (75 mg total) by mouth daily. 90 tablet 3   gabapentin (NEURONTIN) 100 MG capsule TAKE 2 CAPSULES BY MOUTH 3 TIMES DAILY. 540 capsule 0   glipiZIDE (GLUCOTROL XL) 10 MG 24 hr tablet TAKE 1 TABLET (10 MG TOTAL) BY MOUTH DAILY WITH BREAKFAST. 90 tablet 2   glucose blood test strip Use to check Blood Sugars 2x a day. DX: E11.9 100 each 12   Influenza Vac High-Dose Quad (FLUZONE HIGH-DOSE QUADRIVALENT IM)      JANUVIA 100 MG tablet TAKE 1 TABLET BY MOUTH EVERY DAY 90 tablet 2   Methoxy PEG-Epoetin Beta (MIRCERA IJ) Mircera     multivitamin (RENA-VIT) TABS tablet TAKE 1 TABLET BY MOUTH EVERYDAY AT BEDTIME 90 tablet 2  nebivolol (BYSTOLIC) 5 MG tablet Take 1 tablet (5 mg total) by mouth daily. 90 tablet 3   pantoprazole (PROTONIX) 40 MG tablet TAKE 1 TABLET BY MOUTH TWICE A DAY 180 tablet 2   RESTASIS 0.05 % ophthalmic emulsion 1 drop 2 (two) times daily.     rosuvastatin (CRESTOR) 40 MG tablet TAKE 1 TABLET BY MOUTH EVERY DAY 90 tablet 3   VELPHORO 500 MG chewable tablet      Current Facility-Administered Medications on File Prior to Visit  Medication Dose Route Frequency Provider Last Rate Last Admin   triamcinolone acetonide (TRIESENCE) 40 MG/ML subtenons injection 4 mg  4 mg Intravitreal  Bernarda Caffey, MD   4 mg at 02/24/18 1122        ROS:  All others reviewed and negative.  Objective        PE:  BP (!) 150/76 (BP Location: Right Arm, Patient Position: Sitting, Cuff Size: Normal)   Pulse 64   Temp 98.1 F (36.7 C) (Oral)   Ht 5' (1.524 m)   Wt 136 lb 9.6 oz (62 kg)   SpO2 94%   BMI 26.68 kg/m                 Constitutional: Pt appears in NAD               HENT: Head: NCAT.                Right Ear: External ear normal.                 Left Ear: External ear normal.                Eyes: . Pupils are equal, round, and reactive to light. Conjunctivae and EOM are normal               Nose: without d/c or deformity               Neck:  Neck supple. Gross normal ROM               Cardiovascular: Normal rate and regular rhythm.                 Pulmonary/Chest: Effort normal and breath sounds without rales or wheezing.                Abd:  Soft, NT, ND, + BS, no organomegaly               Neurological: Pt is alert. At baseline orientation, motor grossly intact; right shoulder tender subacromial               Skin: Skin is warm. No rashes, no other new lesions, LE edema - none; right index finger pad with < 1/2 cm scabbed area tender nonflluctuance without drainage               Psychiatric: Pt behavior is normal without agitation   Micro: none  Cardiac tracings I have personally interpreted today:  none  Pertinent Radiological findings (summarize): none   Lab Results  Component Value Date   WBC 6.7 02/13/2021   HGB 10.0 (L) 02/13/2021   HCT 30.1 (L) 02/13/2021   PLT 222.0 02/13/2021   GLUCOSE 89 02/13/2021   CHOL 82 02/13/2021   TRIG 59.0 02/13/2021   HDL 39.80 02/13/2021   LDLDIRECT 86.0 04/06/2017   LDLCALC 30 02/13/2021   ALT 9 02/13/2021   AST 18 02/13/2021   NA  139 02/13/2021   K 3.6 02/13/2021   CL 94 (L) 02/13/2021   CREATININE 4.92 (HH) 02/13/2021   BUN 13 02/13/2021   CO2 34 (H) 02/13/2021   TSH 1.40 02/13/2021   INR 1.06 06/30/2016   HGBA1C 6.4 02/13/2021   MICROALBUR 108.1 (H) 09/15/2016   Assessment/Plan:  SIBLE STRALEY is a 70 y.o. Black or African American [2] female with  has a past medical history of Allergic rhinitis, cause unspecified (12/08/2010), ANEMIA-IRON DEFICIENCY (01/25/2008), Aortic regurgitation, CAD (coronary artery disease), CERVICAL RADICULOPATHY, LEFT (8/76/8115), Chronic systolic CHF (congestive heart failure) ([place]), Critical lower limb ischemia ([place]), DIABETES MELLITUS, UNCONTROLLED (05/21/2009), Esophagitis, ESRD on dialysis ([place]), Foot ulcer ([place]), HCAP (healthcare-associated pneumonia) (06/29/2016), HYPERLIPIDEMIA (03/30/2007), HYPERTENSION (01/25/2008), Ischemic cardiomyopathy,  Mitral regurgitation, PVD (peripheral vascular disease) ([place]), SECONDARY HYPERPARATHYROIDISM (05/21/2009), and Severe aortic regurgitation.  Encounter for well adult exam with abnormal findings Age and sex appropriate education and counseling updated with regular exercise and diet Referrals for preventative services - for mammogram and colonoscopy Immunizations addressed - none needed Smoking counseling  - none needed Evidence for depression or other mood disorder - none significant Most recent labs reviewed. I have personally reviewed and have noted: 1) the patient's medical and social history 2) The patient's current medications and supplements 3) The patient's height, weight, and BMI have been recorded in the chart   PVD (peripheral vascular disease) ([place]) Stable, cont statin crestor and asa, plavix  ESRD on dialysis ([place]) To cont HD as per renal,  to f/u any worsening symptoms or concerns  Type 2 diabetes mellitus with diabetic chronic kidney disease ([place]) Lab Results  Component Value Date   HGBA1C 6.4 02/13/2021   Stable, pt to continue current medical treatment glucotrol, januvia   Hyperlipidemia . Lab Results  Component Value Date   LDLCALC 30 02/13/2021   Stable, pt to continue current statin crestor   Essential hypertension BP Readings from Last 3 Encounters:  02/13/21 (!) 150/76  01/14/21 130/60  03/12/20 120/60   Stable, pt to continue medical treatment norvasc, bystolic, declines need for med changed for now   Shortness of breath Ok for tiral albuterol hfa prn,  to f/u any worsening symptoms or concerns  Lump of right breast Also for diagnostic mamogram,  to f/u any worsening symptoms or concerns  Gait disorder Also for referral outpatient PT  Finger pain Cant r/o unelrying infeciton, for doxy course, also refer hand surgury  Right shoulder pain Possible bursitis - for refer sports medicine,  to f/u any worsening symptoms or concerns  Followup:  Return in about 6 months (around 08/15/2021).  Cathlean Cower, MD 02/16/2021 8:56 PM [facility]

## 2021-02-13 NOTE — Patient Instructions (Signed)
Please take all new medication as prescribed - the inhaler as needed for shortness of breath, and the antibiotic for the right index finger possible infection  Please continue all other medications as before, and refills have been done if requested.  Please have the pharmacy call with any other refills you may need.  Please continue your efforts at being more active, low cholesterol diet, and weight control.  You are otherwise up to date with prevention measures today.  Please keep your appointments with your specialists as you may have planned  You will be contacted regarding the referral for: outpatient Physical Therapy for the walking strength, Sports Medicine for the right shoulder pain, Diagnostic Mammogram for the right breast lump, and Colonoscopy to chech for polyps and colon cancer  Please call if you change your mind about the referral to the Hand Surgeon for the right first finger  Please go to the LAB at the blood drawing area for the tests to be done  You will be contacted by phone if any changes need to be made immediately.  Otherwise, you will receive a letter about your results with an explanation, but please check with MyChart first.  Please remember to sign up for MyChart if you have not done so, as this will be important to you in the future with finding out test results, communicating by private email, and scheduling acute appointments online when needed.  Please make an Appointment to return in 6 months, or sooner if needed

## 2021-02-13 NOTE — Telephone Encounter (Signed)
Critical Creatinine- 4.92 & GFR 8.54

## 2021-02-14 ENCOUNTER — Encounter: Payer: Self-pay | Admitting: Family Medicine

## 2021-02-14 DIAGNOSIS — N2581 Secondary hyperparathyroidism of renal origin: Secondary | ICD-10-CM | POA: Diagnosis not present

## 2021-02-14 DIAGNOSIS — Z992 Dependence on renal dialysis: Secondary | ICD-10-CM | POA: Diagnosis not present

## 2021-02-14 DIAGNOSIS — N186 End stage renal disease: Secondary | ICD-10-CM | POA: Diagnosis not present

## 2021-02-14 NOTE — Telephone Encounter (Signed)
Per chart MD has sent lab letter to pt concerning results.Marland KitchenJohny Valenzuela

## 2021-02-16 DIAGNOSIS — M25511 Pain in right shoulder: Secondary | ICD-10-CM | POA: Insufficient documentation

## 2021-02-16 DIAGNOSIS — N6310 Unspecified lump in the right breast, unspecified quadrant: Secondary | ICD-10-CM | POA: Insufficient documentation

## 2021-02-16 DIAGNOSIS — M79646 Pain in unspecified finger(s): Secondary | ICD-10-CM | POA: Insufficient documentation

## 2021-02-16 DIAGNOSIS — R269 Unspecified abnormalities of gait and mobility: Secondary | ICD-10-CM | POA: Insufficient documentation

## 2021-02-16 NOTE — Assessment & Plan Note (Signed)
.   Lab Results  Component Value Date   LDLCALC 30 02/13/2021   Stable, pt to continue current statin crestor

## 2021-02-16 NOTE — Assessment & Plan Note (Signed)
Also for diagnostic mamogram,  to f/u any worsening symptoms or concerns

## 2021-02-16 NOTE — Assessment & Plan Note (Signed)
Cant r/o unelrying infeciton, for doxy course, also refer hand surgury

## 2021-02-16 NOTE — Assessment & Plan Note (Signed)
Possible bursitis - for refer sports medicine,  to f/u any worsening symptoms or concerns

## 2021-02-16 NOTE — Assessment & Plan Note (Signed)
Also for referral outpatient PT

## 2021-02-16 NOTE — Assessment & Plan Note (Signed)
Age and sex appropriate education and counseling updated with regular exercise and diet Referrals for preventative services - for mammogram and colonoscopy Immunizations addressed - none needed Smoking counseling  - none needed Evidence for depression or other mood disorder - none significant Most recent labs reviewed. I have personally reviewed and have noted: 1) the patient's medical and social history 2) The patient's current medications and supplements 3) The patient's height, weight, and BMI have been recorded in the chart

## 2021-02-16 NOTE — Assessment & Plan Note (Signed)
[place] for tiral albuterol hfa prn,  to f/u any worsening symptoms or concerns

## 2021-02-16 NOTE — Assessment & Plan Note (Signed)
BP Readings from Last 3 Encounters:  02/13/21 (!) 150/76  01/14/21 130/60  03/12/20 120/60   Stable, pt to continue medical treatment norvasc, bystolic, declines need for med changed for now

## 2021-02-16 NOTE — Assessment & Plan Note (Signed)
Stable, cont statin crestor and asa, plavix

## 2021-02-16 NOTE — Assessment & Plan Note (Signed)
To cont HD as per renal,  to f/u any worsening symptoms or concerns

## 2021-02-16 NOTE — Assessment & Plan Note (Signed)
Lab Results  Component Value Date   HGBA1C 6.4 02/13/2021   Stable, pt to continue current medical treatment glucotrol, [place]

## 2021-02-17 DIAGNOSIS — N186 End stage renal disease: Secondary | ICD-10-CM | POA: Diagnosis not present

## 2021-02-17 DIAGNOSIS — N2581 Secondary hyperparathyroidism of renal origin: Secondary | ICD-10-CM | POA: Diagnosis not present

## 2021-02-17 DIAGNOSIS — Z992 Dependence on renal dialysis: Secondary | ICD-10-CM | POA: Diagnosis not present

## 2021-02-19 DIAGNOSIS — N2581 Secondary hyperparathyroidism of renal origin: Secondary | ICD-10-CM | POA: Diagnosis not present

## 2021-02-19 DIAGNOSIS — Z992 Dependence on renal dialysis: Secondary | ICD-10-CM | POA: Diagnosis not present

## 2021-02-19 DIAGNOSIS — N186 End stage renal disease: Secondary | ICD-10-CM | POA: Diagnosis not present

## 2021-02-21 DIAGNOSIS — N2581 Secondary hyperparathyroidism of renal origin: Secondary | ICD-10-CM | POA: Diagnosis not present

## 2021-02-21 DIAGNOSIS — N186 End stage renal disease: Secondary | ICD-10-CM | POA: Diagnosis not present

## 2021-02-21 DIAGNOSIS — Z992 Dependence on renal dialysis: Secondary | ICD-10-CM | POA: Diagnosis not present

## 2021-02-24 DIAGNOSIS — Z992 Dependence on renal dialysis: Secondary | ICD-10-CM | POA: Diagnosis not present

## 2021-02-24 DIAGNOSIS — N186 End stage renal disease: Secondary | ICD-10-CM | POA: Diagnosis not present

## 2021-02-24 DIAGNOSIS — N2581 Secondary hyperparathyroidism of renal origin: Secondary | ICD-10-CM | POA: Diagnosis not present

## 2021-02-26 DIAGNOSIS — N186 End stage renal disease: Secondary | ICD-10-CM | POA: Diagnosis not present

## 2021-02-26 DIAGNOSIS — N2581 Secondary hyperparathyroidism of renal origin: Secondary | ICD-10-CM | POA: Diagnosis not present

## 2021-02-26 DIAGNOSIS — Z992 Dependence on renal dialysis: Secondary | ICD-10-CM | POA: Diagnosis not present

## 2021-02-27 DIAGNOSIS — Z992 Dependence on renal dialysis: Secondary | ICD-10-CM | POA: Diagnosis not present

## 2021-02-27 DIAGNOSIS — T82858A Stenosis of vascular prosthetic devices, implants and grafts, initial encounter: Secondary | ICD-10-CM | POA: Diagnosis not present

## 2021-02-27 DIAGNOSIS — I871 Compression of vein: Secondary | ICD-10-CM | POA: Diagnosis not present

## 2021-02-27 DIAGNOSIS — E1129 Type 2 diabetes mellitus with other diabetic kidney complication: Secondary | ICD-10-CM | POA: Diagnosis not present

## 2021-02-27 DIAGNOSIS — N186 End stage renal disease: Secondary | ICD-10-CM | POA: Diagnosis not present

## 2021-02-28 DIAGNOSIS — N186 End stage renal disease: Secondary | ICD-10-CM | POA: Diagnosis not present

## 2021-02-28 DIAGNOSIS — Z992 Dependence on renal dialysis: Secondary | ICD-10-CM | POA: Diagnosis not present

## 2021-02-28 DIAGNOSIS — N2581 Secondary hyperparathyroidism of renal origin: Secondary | ICD-10-CM | POA: Diagnosis not present

## 2021-03-03 DIAGNOSIS — N186 End stage renal disease: Secondary | ICD-10-CM | POA: Diagnosis not present

## 2021-03-03 DIAGNOSIS — Z992 Dependence on renal dialysis: Secondary | ICD-10-CM | POA: Diagnosis not present

## 2021-03-03 DIAGNOSIS — N2581 Secondary hyperparathyroidism of renal origin: Secondary | ICD-10-CM | POA: Diagnosis not present

## 2021-03-05 ENCOUNTER — Other Ambulatory Visit: Payer: Self-pay | Admitting: Internal Medicine

## 2021-03-05 DIAGNOSIS — N2581 Secondary hyperparathyroidism of renal origin: Secondary | ICD-10-CM | POA: Diagnosis not present

## 2021-03-05 DIAGNOSIS — N186 End stage renal disease: Secondary | ICD-10-CM | POA: Diagnosis not present

## 2021-03-05 DIAGNOSIS — Z992 Dependence on renal dialysis: Secondary | ICD-10-CM | POA: Diagnosis not present

## 2021-03-06 ENCOUNTER — Encounter: Payer: Self-pay | Admitting: Internal Medicine

## 2021-03-07 DIAGNOSIS — Z992 Dependence on renal dialysis: Secondary | ICD-10-CM | POA: Diagnosis not present

## 2021-03-07 DIAGNOSIS — N2581 Secondary hyperparathyroidism of renal origin: Secondary | ICD-10-CM | POA: Diagnosis not present

## 2021-03-07 DIAGNOSIS — N186 End stage renal disease: Secondary | ICD-10-CM | POA: Diagnosis not present

## 2021-03-10 DIAGNOSIS — N186 End stage renal disease: Secondary | ICD-10-CM | POA: Diagnosis not present

## 2021-03-10 DIAGNOSIS — N2581 Secondary hyperparathyroidism of renal origin: Secondary | ICD-10-CM | POA: Diagnosis not present

## 2021-03-10 DIAGNOSIS — Z992 Dependence on renal dialysis: Secondary | ICD-10-CM | POA: Diagnosis not present

## 2021-03-10 NOTE — Progress Notes (Signed)
Subjective:    CC: R shoulder pain  I, Carolyn Valenzuela, LAT, ATC, am serving as scribe for Dr. Lynne Leader.  HPI: Pt is a 70 y/o female presenting w/ c/o R shoulder pain x approximately 2 months w/ no known MOI.  She locates her pain to her R sup-ant shoulder w/ radiating pain into her R upper arm.  She walks w/ a RW that she's been using for about 2 years.  Radiating pain: yes into her R upper arm R shoulder mechanical symptoms: No Aggravating factors: R shoulder flex and aBd AROM; R sidelying; functional IR; pushing down w/ her R UE into her walker Treatments tried: Tylenol arthritis;   Pertinent review of Systems: No fevers or chills  Relevant historical information: Diabetes.  Dialysis dependent renal failure.  History of bilateral lower leg amputation due to peripheral vascular disease.   Objective:    Vitals:   03/11/21 1052  BP: 128/60  Pulse: 69  SpO2: 95%   General: Well Developed, well nourished, and in no acute distress.   MSK: Right shoulder normal-appearing Decreased range of motion to abduction external and internal rotation. Strength intact within limits of motion. And abdomen positioning for impingement testing. Negative Yergason's and speeds test. Pulses intact distally.  Lab and Radiology Results  Procedure: Real-time Ultrasound Guided Injection of right shoulder glenohumeral joint posterior approach Device: Philips Affiniti 50G Images permanently stored and available for review in PACS Verbal informed consent obtained.  Discussed risks and benefits of procedure. Warned about infection bleeding damage to structures skin hypopigmentation and fat atrophy among others. Patient expresses understanding and agreement Time-out conducted.   Noted no overlying erythema, induration, or other signs of local infection.   Skin prepped in a sterile fashion.   Local anesthesia: Topical Ethyl chloride.   With sterile technique and under real time ultrasound  guidance: 40 mg of Kenalog and 2 ml of Marcaine injected into shoulder joint. Fluid seen entering the joint capsule.   Completed without difficulty   Pain immediately resolved suggesting accurate placement of the medication.   Advised to call if fevers/chills, erythema, induration, drainage, or persistent bleeding.   Images permanently stored and available for review in the ultrasound unit.  Impression: Technically successful ultrasound guided injection.    X-ray images right shoulder obtained today personally and independently interpreted Severe DJD with AVN changes.   Await formal radiology review Of note this x-ray was obtained following the end the visit.    Impression and Recommendations:    Assessment and Plan: 70 y.o. female with right shoulder pain due to DJD.  Plan for injection today.  Await formal radiology overread x-ray.  Of note the x-rays obtained following the end of the visit and my personal read shows DJD and possibly AVN.  Original thought was possibly adhesive capsulitis but based on x-ray she is less of a candidate for physical therapy therefore PT will be canceled.  Recheck in 6 weeks.  PDMP not reviewed this encounter. Orders Placed This Encounter  Procedures   [place] LIMITED JOINT SPACE STRUCTURES UP RIGHT(NO LINKED CHARGES)    Order Specific Question:   Reason for Exam (SYMPTOM  OR DIAGNOSIS REQUIRED)    Answer:   R shoulder pain    Order Specific Question:   Preferred imaging location?    Answer:   [facility]   DG Shoulder Right    Standing Status:   Future    Number of Occurrences:   1  Standing Expiration Date:   04/11/2021    Order Specific Question:   Reason for Exam (SYMPTOM  OR DIAGNOSIS REQUIRED)    Answer:   R shoulder pain    Order Specific Question:   Preferred imaging location?    Answer:   [facility]   No orders of the defined types were placed in this encounter.   Discussed warning signs or symptoms.  Please see discharge instructions. Patient expresses understanding.   The above documentation has been reviewed and is accurate and complete Lynne Leader, M.D.

## 2021-03-11 ENCOUNTER — Other Ambulatory Visit: Payer: Self-pay

## 2021-03-11 ENCOUNTER — Ambulatory Visit: Payer: Self-pay

## 2021-03-11 ENCOUNTER — Encounter: Payer: Self-pay | Admitting: Family Medicine

## 2021-03-11 ENCOUNTER — Ambulatory Visit (INDEPENDENT_AMBULATORY_CARE_PROVIDER_SITE_OTHER): Payer: Medicare PPO | Admitting: Family Medicine

## 2021-03-11 ENCOUNTER — Ambulatory Visit (INDEPENDENT_AMBULATORY_CARE_PROVIDER_SITE_OTHER): Payer: Medicare PPO

## 2021-03-11 VITALS — BP 128/60 | HR 69 | Ht 60.0 in | Wt 135.6 lb

## 2021-03-11 DIAGNOSIS — M25511 Pain in right shoulder: Secondary | ICD-10-CM | POA: Diagnosis not present

## 2021-03-11 NOTE — Patient Instructions (Signed)
Thank you for coming in today.   Please get an Xray today before you leave   Call or go to the ER if you develop a large red swollen joint with extreme pain or oozing puss.    I've referred you to Physical Therapy.  Let us know if you don't hear from them in one week.   Recheck with me in 6 weeks.   Let me know if this does not work.

## 2021-03-12 DIAGNOSIS — N2581 Secondary hyperparathyroidism of renal origin: Secondary | ICD-10-CM | POA: Diagnosis not present

## 2021-03-12 DIAGNOSIS — N186 End stage renal disease: Secondary | ICD-10-CM | POA: Diagnosis not present

## 2021-03-12 DIAGNOSIS — Z992 Dependence on renal dialysis: Secondary | ICD-10-CM | POA: Diagnosis not present

## 2021-03-13 DIAGNOSIS — E113511 Type 2 diabetes mellitus with proliferative diabetic retinopathy with macular edema, right eye: Secondary | ICD-10-CM | POA: Diagnosis not present

## 2021-03-13 DIAGNOSIS — H16223 Keratoconjunctivitis sicca, not specified as Sjogren's, bilateral: Secondary | ICD-10-CM | POA: Diagnosis not present

## 2021-03-13 DIAGNOSIS — E113552 Type 2 diabetes mellitus with stable proliferative diabetic retinopathy, left eye: Secondary | ICD-10-CM | POA: Diagnosis not present

## 2021-03-13 DIAGNOSIS — H527 Unspecified disorder of refraction: Secondary | ICD-10-CM | POA: Diagnosis not present

## 2021-03-13 DIAGNOSIS — E113551 Type 2 diabetes mellitus with stable proliferative diabetic retinopathy, right eye: Secondary | ICD-10-CM | POA: Diagnosis not present

## 2021-03-13 DIAGNOSIS — H40023 Open angle with borderline findings, high risk, bilateral: Secondary | ICD-10-CM | POA: Diagnosis not present

## 2021-03-13 NOTE — Progress Notes (Signed)
Right shoulder x-ray shows severe right shoulder arthritis.

## 2021-03-14 DIAGNOSIS — N186 End stage renal disease: Secondary | ICD-10-CM | POA: Diagnosis not present

## 2021-03-14 DIAGNOSIS — Z992 Dependence on renal dialysis: Secondary | ICD-10-CM | POA: Diagnosis not present

## 2021-03-14 DIAGNOSIS — N2581 Secondary hyperparathyroidism of renal origin: Secondary | ICD-10-CM | POA: Diagnosis not present

## 2021-03-17 DIAGNOSIS — N186 End stage renal disease: Secondary | ICD-10-CM | POA: Diagnosis not present

## 2021-03-17 DIAGNOSIS — Z992 Dependence on renal dialysis: Secondary | ICD-10-CM | POA: Diagnosis not present

## 2021-03-17 DIAGNOSIS — N2581 Secondary hyperparathyroidism of renal origin: Secondary | ICD-10-CM | POA: Diagnosis not present

## 2021-03-19 DIAGNOSIS — N2581 Secondary hyperparathyroidism of renal origin: Secondary | ICD-10-CM | POA: Diagnosis not present

## 2021-03-19 DIAGNOSIS — Z992 Dependence on renal dialysis: Secondary | ICD-10-CM | POA: Diagnosis not present

## 2021-03-19 DIAGNOSIS — N186 End stage renal disease: Secondary | ICD-10-CM | POA: Diagnosis not present

## 2021-03-21 DIAGNOSIS — N2581 Secondary hyperparathyroidism of renal origin: Secondary | ICD-10-CM | POA: Diagnosis not present

## 2021-03-21 DIAGNOSIS — N186 End stage renal disease: Secondary | ICD-10-CM | POA: Diagnosis not present

## 2021-03-21 DIAGNOSIS — Z992 Dependence on renal dialysis: Secondary | ICD-10-CM | POA: Diagnosis not present

## 2021-03-24 ENCOUNTER — Other Ambulatory Visit: Payer: Self-pay | Admitting: Internal Medicine

## 2021-03-24 DIAGNOSIS — N186 End stage renal disease: Secondary | ICD-10-CM | POA: Diagnosis not present

## 2021-03-24 DIAGNOSIS — Z992 Dependence on renal dialysis: Secondary | ICD-10-CM | POA: Diagnosis not present

## 2021-03-24 DIAGNOSIS — N2581 Secondary hyperparathyroidism of renal origin: Secondary | ICD-10-CM | POA: Diagnosis not present

## 2021-03-26 DIAGNOSIS — N186 End stage renal disease: Secondary | ICD-10-CM | POA: Diagnosis not present

## 2021-03-26 DIAGNOSIS — Z992 Dependence on renal dialysis: Secondary | ICD-10-CM | POA: Diagnosis not present

## 2021-03-26 DIAGNOSIS — N2581 Secondary hyperparathyroidism of renal origin: Secondary | ICD-10-CM | POA: Diagnosis not present

## 2021-03-28 DIAGNOSIS — N2581 Secondary hyperparathyroidism of renal origin: Secondary | ICD-10-CM | POA: Diagnosis not present

## 2021-03-28 DIAGNOSIS — N186 End stage renal disease: Secondary | ICD-10-CM | POA: Diagnosis not present

## 2021-03-28 DIAGNOSIS — Z992 Dependence on renal dialysis: Secondary | ICD-10-CM | POA: Diagnosis not present

## 2021-03-30 DIAGNOSIS — Z992 Dependence on renal dialysis: Secondary | ICD-10-CM | POA: Diagnosis not present

## 2021-03-30 DIAGNOSIS — N186 End stage renal disease: Secondary | ICD-10-CM | POA: Diagnosis not present

## 2021-03-30 DIAGNOSIS — E1129 Type 2 diabetes mellitus with other diabetic kidney complication: Secondary | ICD-10-CM | POA: Diagnosis not present

## 2021-03-31 DIAGNOSIS — N2581 Secondary hyperparathyroidism of renal origin: Secondary | ICD-10-CM | POA: Diagnosis not present

## 2021-03-31 DIAGNOSIS — Z992 Dependence on renal dialysis: Secondary | ICD-10-CM | POA: Diagnosis not present

## 2021-03-31 DIAGNOSIS — N186 End stage renal disease: Secondary | ICD-10-CM | POA: Diagnosis not present

## 2021-04-02 ENCOUNTER — Telehealth: Payer: Self-pay | Admitting: *Deleted

## 2021-04-02 DIAGNOSIS — N2581 Secondary hyperparathyroidism of renal origin: Secondary | ICD-10-CM | POA: Diagnosis not present

## 2021-04-02 DIAGNOSIS — Z992 Dependence on renal dialysis: Secondary | ICD-10-CM | POA: Diagnosis not present

## 2021-04-02 DIAGNOSIS — N186 End stage renal disease: Secondary | ICD-10-CM | POA: Diagnosis not present

## 2021-04-02 NOTE — Chronic Care Management (AMB) (Signed)
  Chronic Care Management   Outreach Note  04/02/2021 Name: Carolyn Valenzuela MRN: [id number] DOB: 1950-09-12  Carolyn Valenzuela is a 70 y.o. year old female who is a primary care patient of Biagio Borg, MD. I reached out to [name] by phone today in response to a referral sent by Ms. Nelia Shi Bojanowski's PCP, Dr. Jenny Reichmann.      A telephone outreach was attempted today. The patient was referred to the case management team for assistance with care management and care coordination.   Follow Up Plan: The care management team will reach out to the patient again over the next 7 days.  If patient returns call to provider office, please advise to call [place] at [phone].  [place] Management  Direct Dial: [phone]

## 2021-04-03 NOTE — Chronic Care Management (AMB) (Signed)
  Chronic Care Management   Note  04/03/2021 Name: Carolyn Valenzuela MRN: [id number] DOB: Aug 24, 1951  Carolyn Valenzuela is a 70 y.o. year old female who is a primary care patient of Biagio Borg, MD. I reached out to [name] by phone today in response to a referral sent by Carolyn Valenzuela PCP, Dr. Jenny Reichmann.      Carolyn Valenzuela was given information about Chronic Care Management services today including:  CCM service includes personalized support from designated clinical staff supervised by her physician, including individualized plan of care and coordination with other care providers 24/7 contact phone numbers for assistance for urgent and routine care needs. Service will only be billed when office clinical staff spend 20 minutes or more in a month to coordinate care. Only one practitioner may furnish and bill the service in a calendar month. The patient may stop CCM services at any time (effective at the end of the month) by phone call to the office staff. The patient will be responsible for cost sharing (co-pay) of up to 20% of the service fee (after annual deductible is met).  Patient agreed to services and verbal consent obtained.   Follow up plan: Telephone appointment with care management team member scheduled for:05/01/21  [facility] Management  Direct Dial: [phone]

## 2021-04-04 DIAGNOSIS — N2581 Secondary hyperparathyroidism of renal origin: Secondary | ICD-10-CM | POA: Diagnosis not present

## 2021-04-04 DIAGNOSIS — N186 End stage renal disease: Secondary | ICD-10-CM | POA: Diagnosis not present

## 2021-04-04 DIAGNOSIS — Z992 Dependence on renal dialysis: Secondary | ICD-10-CM | POA: Diagnosis not present

## 2021-04-07 DIAGNOSIS — N2581 Secondary hyperparathyroidism of renal origin: Secondary | ICD-10-CM | POA: Diagnosis not present

## 2021-04-07 DIAGNOSIS — Z992 Dependence on renal dialysis: Secondary | ICD-10-CM | POA: Diagnosis not present

## 2021-04-07 DIAGNOSIS — N186 End stage renal disease: Secondary | ICD-10-CM | POA: Diagnosis not present

## 2021-04-09 DIAGNOSIS — N186 End stage renal disease: Secondary | ICD-10-CM | POA: Diagnosis not present

## 2021-04-09 DIAGNOSIS — Z992 Dependence on renal dialysis: Secondary | ICD-10-CM | POA: Diagnosis not present

## 2021-04-09 DIAGNOSIS — N2581 Secondary hyperparathyroidism of renal origin: Secondary | ICD-10-CM | POA: Diagnosis not present

## 2021-04-11 DIAGNOSIS — N186 End stage renal disease: Secondary | ICD-10-CM | POA: Diagnosis not present

## 2021-04-11 DIAGNOSIS — Z992 Dependence on renal dialysis: Secondary | ICD-10-CM | POA: Diagnosis not present

## 2021-04-11 DIAGNOSIS — N2581 Secondary hyperparathyroidism of renal origin: Secondary | ICD-10-CM | POA: Diagnosis not present

## 2021-04-14 DIAGNOSIS — N186 End stage renal disease: Secondary | ICD-10-CM | POA: Diagnosis not present

## 2021-04-14 DIAGNOSIS — N2581 Secondary hyperparathyroidism of renal origin: Secondary | ICD-10-CM | POA: Diagnosis not present

## 2021-04-14 DIAGNOSIS — Z992 Dependence on renal dialysis: Secondary | ICD-10-CM | POA: Diagnosis not present

## 2021-04-16 DIAGNOSIS — Z992 Dependence on renal dialysis: Secondary | ICD-10-CM | POA: Diagnosis not present

## 2021-04-16 DIAGNOSIS — N186 End stage renal disease: Secondary | ICD-10-CM | POA: Diagnosis not present

## 2021-04-16 DIAGNOSIS — N2581 Secondary hyperparathyroidism of renal origin: Secondary | ICD-10-CM | POA: Diagnosis not present

## 2021-04-18 DIAGNOSIS — Z992 Dependence on renal dialysis: Secondary | ICD-10-CM | POA: Diagnosis not present

## 2021-04-18 DIAGNOSIS — N2581 Secondary hyperparathyroidism of renal origin: Secondary | ICD-10-CM | POA: Diagnosis not present

## 2021-04-18 DIAGNOSIS — N186 End stage renal disease: Secondary | ICD-10-CM | POA: Diagnosis not present

## 2021-04-21 DIAGNOSIS — N186 End stage renal disease: Secondary | ICD-10-CM | POA: Diagnosis not present

## 2021-04-21 DIAGNOSIS — Z992 Dependence on renal dialysis: Secondary | ICD-10-CM | POA: Diagnosis not present

## 2021-04-21 DIAGNOSIS — N2581 Secondary hyperparathyroidism of renal origin: Secondary | ICD-10-CM | POA: Diagnosis not present

## 2021-04-22 ENCOUNTER — Encounter: Payer: Self-pay | Admitting: Family Medicine

## 2021-04-22 ENCOUNTER — Other Ambulatory Visit: Payer: Self-pay

## 2021-04-22 ENCOUNTER — Ambulatory Visit (INDEPENDENT_AMBULATORY_CARE_PROVIDER_SITE_OTHER): Payer: Medicare PPO | Admitting: Family Medicine

## 2021-04-22 VITALS — BP 140/66 | HR 68 | Ht 60.0 in | Wt 134.2 lb

## 2021-04-22 DIAGNOSIS — M13811 Other specified arthritis, right shoulder: Secondary | ICD-10-CM | POA: Diagnosis not present

## 2021-04-22 DIAGNOSIS — M25511 Pain in right shoulder: Secondary | ICD-10-CM | POA: Diagnosis not present

## 2021-04-22 NOTE — Patient Instructions (Signed)
Thank you for coming in today.  Return on or after Sep 12 for right shoulder injection.  If that does not help next step would be shoulder surgery consolation.

## 2021-04-22 NOTE — Progress Notes (Signed)
   I, Carolyn Valenzuela, LAT, ATC, am serving as scribe for Dr. Lynne Leader.  Carolyn Valenzuela is a 70 y.o. female who presents to [place] [facility] today for f/u of R sup-ant shoulder pain due to DJD and possible AVN of her humeral head.  She was last seen by Dr. Georgina Snell on 03/11/21 and had a R GHJ injection.  Since her last visit, pt reports that the injection helped for about 2 weeks and then the pain returned. She states that her pain is about the same as previously.  She has the most pain w/ attempts at R shoulder AROM and has to do mainly AAROM in order to lift her arm.  Diagnostic imaging: R shoulder XR- 03/11/21   Pertinent review of systems: no fever or chills  Relevant historical information: Dialysis   Exam:  BP 140/66 (BP Location: Right Arm, Patient Position: Sitting, Cuff Size: Normal)   Pulse 68   Ht 5' (1.524 m)   Wt 134 lb 3.2 oz (60.9 kg)   SpO2 91%   BMI 26.21 kg/m  General: Well Developed, well nourished, and in no acute distress.   MSK: Right shoulder normal-appearing Nontender. Decreased shoulder range of motion.    Lab and Radiology Results EXAM: RIGHT SHOULDER - 2+ VIEW   COMPARISON:  None.   FINDINGS: No acute fracture or dislocation. Severe glenohumeral joint osteoarthritis with complete joint space loss, subchondral sclerosis, and subchondral cystic changes upon both sides of the joint. Mild-moderate arthropathy of the [facility] joint. No soft tissue abnormality.   IMPRESSION: Severe right glenohumeral joint osteoarthritis.     Electronically Signed   By: Davina Poke D.O.   On: 03/13/2021 14:18   I, Lynne Leader, personally (independently) visualized and performed the interpretation of the images attached in this note.      Assessment and Plan: 70 y.o. female with right shoulder pain.  Unfortunately [name] has severe glenohumeral DJD which almost certainly is the main source of her pain.  She had immediate but  unfortunately shortly after response to intra-articular injection right shoulder last month.  I do not expect that another injection will provide much longer benefit.  I think ultimately she would needs a shoulder replacement.  She is very reluctant to consider this and would very much like to try another injection before proceeding with surgery consultation.  We will go ahead and schedule her for September 12 which would be 2 months.  We will try repeat injection then and if still not better will refer to orthopedic surgery for evaluation total shoulder replacement.    Discussed warning signs or symptoms. Please see discharge instructions. Patient expresses understanding.   The above documentation has been reviewed and is accurate and complete Lynne Leader, M.D. Total encounter time 20 minutes including face-to-face time with the patient and, reviewing past medical record, and charting on the date of service.   Treatment plan and options

## 2021-04-23 DIAGNOSIS — N186 End stage renal disease: Secondary | ICD-10-CM | POA: Diagnosis not present

## 2021-04-23 DIAGNOSIS — Z992 Dependence on renal dialysis: Secondary | ICD-10-CM | POA: Diagnosis not present

## 2021-04-23 DIAGNOSIS — N2581 Secondary hyperparathyroidism of renal origin: Secondary | ICD-10-CM | POA: Diagnosis not present

## 2021-04-25 DIAGNOSIS — N2581 Secondary hyperparathyroidism of renal origin: Secondary | ICD-10-CM | POA: Diagnosis not present

## 2021-04-25 DIAGNOSIS — N186 End stage renal disease: Secondary | ICD-10-CM | POA: Diagnosis not present

## 2021-04-25 DIAGNOSIS — Z992 Dependence on renal dialysis: Secondary | ICD-10-CM | POA: Diagnosis not present

## 2021-04-28 DIAGNOSIS — N186 End stage renal disease: Secondary | ICD-10-CM | POA: Diagnosis not present

## 2021-04-28 DIAGNOSIS — Z992 Dependence on renal dialysis: Secondary | ICD-10-CM | POA: Diagnosis not present

## 2021-04-28 DIAGNOSIS — N2581 Secondary hyperparathyroidism of renal origin: Secondary | ICD-10-CM | POA: Diagnosis not present

## 2021-04-30 DIAGNOSIS — Z992 Dependence on renal dialysis: Secondary | ICD-10-CM | POA: Diagnosis not present

## 2021-04-30 DIAGNOSIS — N186 End stage renal disease: Secondary | ICD-10-CM | POA: Diagnosis not present

## 2021-04-30 DIAGNOSIS — E1129 Type 2 diabetes mellitus with other diabetic kidney complication: Secondary | ICD-10-CM | POA: Diagnosis not present

## 2021-04-30 DIAGNOSIS — N2581 Secondary hyperparathyroidism of renal origin: Secondary | ICD-10-CM | POA: Diagnosis not present

## 2021-05-01 ENCOUNTER — Ambulatory Visit (INDEPENDENT_AMBULATORY_CARE_PROVIDER_SITE_OTHER): Payer: Medicare PPO | Admitting: *Deleted

## 2021-05-01 DIAGNOSIS — E1122 Type 2 diabetes mellitus with diabetic chronic kidney disease: Secondary | ICD-10-CM

## 2021-05-01 DIAGNOSIS — I10 Essential (primary) hypertension: Secondary | ICD-10-CM

## 2021-05-01 DIAGNOSIS — N186 End stage renal disease: Secondary | ICD-10-CM

## 2021-05-01 NOTE — Chronic Care Management (AMB) (Signed)
Chronic Care Management   CCM RN Visit Note  05/01/2021 Name: Carolyn Valenzuela MRN: [id number] DOB: 1950/12/28  Subjective: Carolyn Valenzuela is a 70 y.o. year old female who is a primary care patient of Biagio Borg, MD. The care management team was consulted for assistance with disease management and care coordination needs.    Engaged with patient by telephone for initial visit in response to provider referral for case management and/or care coordination services.   Consent to Services:  The patient was given information about Chronic Care Management services, agreed to services, and gave verbal consent 04/03/21 prior to initiation of services.  Please see initial visit note for detailed documentation.  Patient agreed to services and verbal consent obtained.   Assessment: Review of patient past medical history, allergies, medications, health status, including review of consultants reports, laboratory and other test data, was performed as part of comprehensive evaluation and provision of chronic care management services.   SDOH (Social Determinants of Health) assessments and interventions performed:  SDOH Interventions    Flowsheet Row Most Recent Value  SDOH Interventions   Food Insecurity Interventions Intervention Not Indicated  Housing Interventions Intervention Not Indicated  Transportation Interventions Intervention Not Indicated  [sister provides transportation,  takes bus to [place] Allergies  Allergen Reactions   Oxycodone Itching   Outpatient Encounter Medications as of 05/01/2021  Medication Sig   ACCU-CHEK GUIDE test strip USE TO CHECK BLOOD SUGARS 2 TIMES A DAY. DX: E11.9   Accu-Chek Softclix Lancets lancets Use to check blood sugars 2x a day. DX: E11.9   acetaminophen (TYLENOL) 500 MG tablet Take 500 mg by mouth every 6 (six) hours as needed for mild pain.   albuterol (VENTOLIN HFA) 108 (90 Base) MCG/ACT inhaler Inhale 2 puffs into the lungs every 6 (six)  hours as needed for wheezing or shortness of breath.   ALPHAGAN P 0.1 % SOLN Apply 1 drop to eye 3 (three) times daily.   amLODipine (NORVASC) 10 MG tablet TAKE 1 TABLET BY MOUTH EVERYDAY AT BEDTIME   aspirin 81 MG EC tablet Take 81 mg by mouth daily.   Blood Glucose Monitoring Suppl (ACCU-CHEK GUIDE ME) w/Device KIT USE DEVICE TO CHECK BLOOD SUGARS 2X A DAY. DX: E11.9   clopidogrel (PLAVIX) 75 MG tablet Take 1 tablet (75 mg total) by mouth daily.   doxycycline (VIBRA-TABS) 100 MG tablet Take 1 tablet (100 mg total) by mouth 2 (two) times daily.   gabapentin (NEURONTIN) 100 MG capsule TAKE 2 CAPSULES BY MOUTH 3 TIMES DAILY.   glipiZIDE (GLUCOTROL XL) 10 MG 24 hr tablet TAKE 1 TABLET (10 MG TOTAL) BY MOUTH DAILY WITH BREAKFAST.   Influenza Vac High-Dose Quad (FLUZONE HIGH-DOSE QUADRIVALENT IM)    JANUVIA 100 MG tablet TAKE 1 TABLET BY MOUTH EVERY DAY   Methoxy PEG-Epoetin Beta (MIRCERA IJ) Mircera   multivitamin (RENA-VIT) TABS tablet TAKE 1 TABLET BY MOUTH EVERYDAY AT BEDTIME   nebivolol (BYSTOLIC) 5 MG tablet Take 1 tablet (5 mg total) by mouth daily.   pantoprazole (PROTONIX) 40 MG tablet TAKE 1 TABLET BY MOUTH TWICE A DAY   RESTASIS 0.05 % ophthalmic emulsion 1 drop 2 (two) times daily.   rosuvastatin (CRESTOR) 40 MG tablet TAKE 1 TABLET BY MOUTH EVERY DAY   VELPHORO 500 MG chewable tablet    Facility-Administered Encounter Medications as of 05/01/2021  Medication   triamcinolone acetonide (TRIESENCE) 40 MG/ML subtenons injection 4 mg  Patient Active Problem List   Diagnosis Date Noted   Lump of right breast 02/16/2021   Gait disorder 02/16/2021   Finger pain 02/16/2021   Right shoulder pain 02/16/2021   Other disorders of plasma-protein metabolism, not elsewhere classified 01/13/2021   Encounter for screening for COVID-19 04/22/2020   Allergy, unspecified, initial encounter 03/28/2020   Anaphylactic shock, unspecified, initial encounter 03/28/2020   Chronic low back pain  03/12/2020   Closed displaced fracture of shaft of fifth metacarpal bone of left hand 04/25/2019   Left hip pain 11/18/2017   GERD (gastroesophageal reflux disease) 10/10/2017   Bloating 10/10/2017   PVD (peripheral vascular disease) ([place])    Phantom pain after amputation of lower extremity ([place]) 01/05/2017   Encounter for removal of sutures 12/01/2016   Abnormal CXR 07/07/2016   Pneumonia, unspecified organism 07/07/2016   Hypotension    NSTEMI (non-ST elevated myocardial infarction) ([place])    Encounter for immunization 05/27/2016   Nausea and vomiting 03/18/2016   Hypokalemia 01/31/2016   Sepsis ([place]) due to UTI 12/20/2015   Acute lower UTI    Labile blood pressure    SIRS (systemic inflammatory response syndrome) ([place])    Hypoglycemia associated with diabetes ([place])    Urinary retention    Dysphagia    S/P bilateral BKA (below knee amputation) ([place])    Abnormality of gait    Muscle spasm    ESRD on dialysis ([place])    Type 2 diabetes mellitus with diabetic peripheral angiopathy and gangrene, without long-term current use of insulin (HCC)    Post-operative pain    Metabolic bone disease    Hypoxia    Type 2 diabetes mellitus with peripheral neuropathy (HCC)    Labile blood glucose    Postoperative pain    Acute blood loss anemia    Anemia of chronic disease    Below knee amputation status 11/29/2015   Odynophagia 11/12/2015   Unspecified disorder of calcium metabolism 07/31/2015   Chest pain, atypical 06/06/2015   Ingrown nail 05/28/2015   Other fluid overload 02/15/2015   Shortness of breath 12/13/2014   Diarrhea, unspecified 12/13/2014   Pain, unspecified 12/13/2014   Pruritus, unspecified 12/13/2014   Unspecified protein-calorie malnutrition ([place]) 10/17/2014   Acute pulmonary edema (HCC)    FUO (fever of unknown origin)    Complication of vascular dialysis catheter 09/10/2014   Fever, unspecified 09/10/2014   HCAP (healthcare-associated pneumonia)    Coagulation  defect, unspecified ([place]) 09/06/2014   Radiculopathy, cervical region 09/06/2014   End stage renal disease ([place]) 09/06/2014   Essential (primary) hypertension 09/06/2014   Hyperlipidemia, unspecified 09/06/2014   Encounter for screening for respiratory tuberculosis 09/06/2014   Secondary hyperparathyroidism of renal origin ([place]) 09/06/2014   Anemia in chronic kidney disease 09/06/2014   Type 2 diabetes mellitus with ESRD (end-stage renal disease) ([place]) 09/01/2014   Anemia of renal disease 09/01/2014   Venous insufficiency of left leg 08/27/2014   Rash 07/06/2014   Diabetic wet gangrene of the foot - Right 03/15/2014   Critical lower limb ischemia ([place]) 01/09/2014   Right foot ulcer ([place]) 11/07/2013   Toe pain 09/06/2013   Pre-ulcerative corn or callous 09/06/2013   Lower extremity pain 09/06/2013   Low back pain 12/08/2011   Hip bursitis, left 12/08/2011   Left leg pain 12/08/2011   Abdominal pain, other specified site 07/08/2011   Encounter for well adult exam with abnormal findings 12/08/2010   Allergic rhinitis, cause unspecified 12/08/2010   Type 2 diabetes  mellitus with diabetic chronic kidney disease ([place]) 05/21/2009   SECONDARY HYPERPARATHYROIDISM 05/21/2009   NUMBNESS 05/21/2009   BACK PAIN 05/02/2009   ANEMIA-IRON DEFICIENCY 01/25/2008   Essential hypertension 01/25/2008   CERVICAL RADICULOPATHY, LEFT 01/25/2008   Hyperlipidemia 03/30/2007   Conditions to be addressed/monitored:  HTN, DMII, and ESRD  Care Plan : RN Care Manager Plan of Care  Updates made by Knox Royalty, RN since 05/01/2021 12:00 AM     Problem: Chronic Disease Management Needs   Priority: Medium     Long-Range Goal: Development of plan of care for long term chronic disease management   Start Date: 05/01/2021  Expected End Date: 05/01/2022  Priority: Medium  Note:   Current Barriers:  Chronic Disease Management support and education needs related to HTN, DMII, and ESRD Bilateral BKA- has  bilateral prosthesis: uses walker ESRD- on hemodialysis: M-W-F; adherent to HD schedule; uses bus for transportation to HD  RNCM Clinical Goal(s):  Patient will demonstrate ongoing health management independence for DMII; ESRD- on HD M-W-F; HTN  through collaboration with RN Care manager, provider, and care team.   Interventions: 1:1 collaboration with primary care provider regarding development and update of comprehensive plan of care as evidenced by provider attestation and co-signature Inter-disciplinary care team collaboration (see longitudinal plan of care) Evaluation of current treatment plan related to  self management and patient's adherence to plan as established by provider  Diabetes:  (Status: New goal.) Lab Results  Component Value Date   HGBA1C 6.4 02/13/2021  Assessed patient's understanding of A1c goal: <6.5% Provided education to patient about basic DM disease process; Reviewed prescribed diet with patient heart healthy, low sodium/ renal; carb-modified/ low sugar; Counseled on importance of regular laboratory monitoring as prescribed;        Discussed plans with patient for ongoing care management follow up and provided patient with direct contact information for care management team;      Provided patient with written educational materials related to hypo and hyperglycemia and importance of correct treatment;       Reviewed scheduled/upcoming provider appointments including: 05/13/21- sports medicine provider; 08/19/21- PCP;         Review of patient status, including review of consultants reports, relevant laboratory and other test results, and medications completed;       Screening for signs and symptoms of depression related to chronic disease state;        Assessed social determinant of health barriers;        Confirmed patient monitors/ records blood sugars at home fasting and HS: reviewed with patient general blood sugars at home: reports consistent fasting and  post-prandial ranges between 130-160; states blood sugar last night at HS was 145; fasting 131 today Confirmed no recent low blood sugars: states had episode last month, sugar dropped to 60: EMS called; hypoglycemia corrected by eating; reports "rare" hypoglycemic episodes; able to verbalize good general understanding of action plan for hypoglycemia- states she always keeps a soda and crackers at her bedside at night "just in case " blood sugar drops Discussed significance of A1-C; patient has good general understanding of same  Hypertension: (Status: New goal.) Last practice recorded BP readings:  BP Readings from Last 3 Encounters:  04/22/21 140/66  03/11/21 128/60  02/13/21 (!) 150/76  Most recent eGFR/CrCl: No results found for: EGFR  No components found for: CRCL  Evaluation of current treatment plan related to hypertension self management and patient's adherence to plan as established by provider;  Discussed plans with patient for ongoing care management follow up and provided patient with direct contact information for care management team; Discussed complications of poorly controlled blood pressure such as heart disease, stroke, circulatory complications, vision complications, kidney impairment, sexual dysfunction;  Confirmed patient does not monitor blood pressures at home: monitors weekly at hemodialysis sessions M-W-F; states she takes lunch to HD which helps blood pressures to be stable; denies issues around hypotension with HD sessions Confirmed patient self-manages medications and denies medication concerns today; declines medication review stating "my medicines haven't changed in a long time"  Patient Goals/Self-Care Activities: Patient will self administer medications as prescribed Patient will attend all scheduled provider appointments Patient will call pharmacy for medication refills Patient will call provider office for new concerns or questions Patient will continue to  attend hemodialysis sessions as established Patient will continue to monitor and write down on paper daily blood sugars at home Patient will continue to follow heart healthy, low salt, low cholesterol, low-carbohydrate, and low sugar diet      Plan: Telephone follow up appointment with care management team member scheduled for:  Thursday, July 31, 2021 at 10:00 am The patient has been provided with contact information for the care management team and has been advised to call with any health related questions or concerns.    Oneta Rack, RN, BSN, [facility] RN Care Coordination- [place] [phone]: direct office [phone]: mobile

## 2021-05-01 NOTE — Patient Instructions (Signed)
Visit Information  Carolyn Valenzuela, it was nice talking with you today.   Please read over the attached information, keep up the great work monitoring and writing down on paper your blood sugars at home and attending all of your hemodialysis sessions   I look forward to talking to you again for an update on Thursday, July 31, 2021 at 10:00 am- please be listening out for my call that day.  I will call as close to 10:00 am as possible.   If you need to cancel or re-schedule our telephone visit, please call [phone] and one of our care guides will be happy to assist you.   I look forward to hearing about your progress.   Please don't hesitate to contact me if I can be of assistance to you before our next scheduled telephone appointment.   Carolyn Rack, RN, BSN, [facility] RN Care Coordination- [place] [phone]: direct office [phone]: mobile   PATIENT GOALS:   Goals Addressed             This Visit's Progress    Patient self-care activities   On track    Timeframe:  Long-Range Goal Priority:  Medium Start Date:          05/01/21                   Expected End Date:       05/01/22                Patient will self administer medications as prescribed Patient will attend all scheduled provider appointments Patient will call pharmacy for medication refills Patient will call provider office for new concerns or questions Patient will continue to attend hemodialysis sessions as established Patient will continue to monitor and write down on paper daily blood sugars at home Patient will continue to follow heart healthy, low salt, low cholesterol, low-carbohydrate, and low sugar diet       Hypoglycemia Hypoglycemia is when the sugar (glucose) level in your blood is too low. Low blood sugar can happen to people who have diabetes and people who do not have diabetes. Low blood sugar can happen quickly, and it can be an emergency. What are the  causes? This condition happens most often in people who have diabetes. It may be caused by: Diabetes medicine. Not eating enough, or not eating often enough. Doing more physical activity. Drinking alcohol on an empty stomach. If you do not have diabetes, this condition may be caused by: A tumor in the pancreas. Not eating enough, or not eating for long periods at a time (fasting). A very bad infection or illness. Problems after having weight loss (bariatric) surgery. Kidney failure or liver failure. Certain medicines. What increases the risk? This condition is more likely to develop in people who: Have diabetes and take medicines to lower their blood sugar. Abuse alcohol. Have a very bad illness. What are the signs or symptoms? Mild Hunger. Sweating and feeling clammy. Feeling dizzy or light-headed. Being sleepy or having trouble sleeping. Feeling like you may vomit (nauseous). A fast heartbeat. A headache. Blurry vision. Mood changes, such as: Being grouchy. Feeling worried or nervous (anxious). Tingling or loss of feeling (numbness) around your mouth, lips, or tongue. Moderate Confusion and poor judgment. Behavior changes. Weakness. Uneven heartbeat. Trouble with moving (coordination). Very low Very low blood sugar (severe hypoglycemia) is a medical emergency. It can cause: Fainting. Seizures. Loss of consciousness (coma). Death. How  is this treated? Treating low blood sugar Low blood sugar is often treated by eating or drinking something that has sugar in it right away. The food or drink should contain 15 grams of a fast-acting carb (carbohydrate). Options include: 4 oz (120 mL) of fruit juice. 4 oz (120 mL) of regular soda (not diet soda). A few pieces of hard candy. Check food labels to see how many pieces to eat for 15 grams. 1 Tbsp (15 mL) of sugar or honey. 4 glucose tablets. 1 tube of glucose gel. Treating low blood sugar if you have diabetes If you can  think clearly and swallow safely, follow the 15:15 rule: Take 15 grams of a fast-acting carb. Talk with your doctor about how much you should take. Always keep a source of fast-acting carb with you, such as: Glucose tablets (take 4 tablets). A few pieces of hard candy. Check food labels to see how many pieces to eat for 15 grams. 4 oz (120 mL) of fruit juice. 4 oz (120 mL) of regular soda (not diet soda). 1 Tbsp (15 mL) of honey or sugar. 1 tube of glucose gel. Check your blood sugar 15 minutes after you take the carb. If your blood sugar is still at or below 70 mg/dL (3.9 mmol/L), take 15 grams of a carb again. If your blood sugar does not go above 70 mg/dL (3.9 mmol/L) after 3 tries, get help right away. After your blood sugar goes back to normal, eat a meal or a snack within 1 hour.  Treating very low blood sugar If your blood sugar is below 54 mg/dL (3 mmol/L), you have very low blood sugar, or severe hypoglycemia. This is an emergency. Get medical help right away. If you have very low blood sugar and you cannot eat or drink, you will need to be given a hormone called glucagon. A family member or friend should learn how to check your blood sugar and how to give you glucagon. Ask your doctor if you need to have an emergency glucagon kit at home. Very low blood sugar may also need to be treated in a hospital. Follow these instructions at home: General instructions Take over-the-counter and prescription medicines only as told by your doctor. Stay aware of your blood sugar as told by your doctor. If you drink alcohol: Limit how much you have to: 0-1 drink a day for women who are not pregnant. 0-2 drinks a day for men. Know how much alcohol is in your drink. In the [place] one drink equals one 12 oz bottle of beer (355 mL), one 5 oz glass of wine (148 mL), or one 1 oz glass of hard liquor (44 mL). Be sure to eat food when you drink alcohol. Know that your body absorbs alcohol quickly. This  may lead to low blood sugar later. Be sure to keep checking your blood sugar. Keep all follow-up visits. If you have diabetes:  Always have a fast-acting carb (15 grams) with you to treat low blood sugar. Follow your diabetes care plan as told by your doctor. Make sure you: Know the symptoms of low blood sugar. Check your blood sugar as often as told. Always check it before and after exercise. Always check your blood sugar before you drive. Take your medicines as told. Follow your meal plan. Eat on time. Do not skip meals. Share your diabetes care plan with: Your work or school. People you live with. Carry a card or wear jewelry that says you have diabetes.  Where to find more information American Diabetes Association: www.diabetes.org Contact a doctor if: You have trouble keeping your blood sugar in your target range. You have low blood sugar often. Get help right away if: You still have symptoms after you eat or drink something that contains 15 grams of fast-acting carb, and you cannot get your blood sugar above 70 mg/dL by following the 15:15 rule. Your blood sugar is below 54 mg/dL (3 mmol/L). You have a seizure. You faint. These symptoms may be an emergency. Get help right away. Call your local emergency services (911 in the [place] Do not wait to see if the symptoms will go away. Do not drive yourself to the hospital. Summary Hypoglycemia happens when the level of sugar (glucose) in your blood is too low. Low blood sugar can happen to people who have diabetes and people who do not have diabetes. Low blood sugar can happen quickly, and it can be an emergency. Make sure you know the symptoms of low blood sugar and know how to treat it. Always keep a source of sugar (fast-acting carb) with you to treat low blood sugar. This information is not intended to replace advice given to you by your health care provider. Make sure you discuss any questions you have with your health care  provider. Document Revised: 07/18/2020 Document Reviewed: 07/18/2020 Elsevier Patient Education  2022 [place]. [place] was given information about Care Management services by the embedded care coordination team including:  Care Management services include personalized support from designated clinical staff supervised by her physician, including individualized plan of care and coordination with other care providers 24/7 contact phone numbers for assistance for urgent and routine care needs. The patient may stop CCM services at any time (effective at the end of the month) by phone call to the office staff.  Patient agreed to services and verbal consent obtained.   The patient verbalized understanding of instructions, educational materials, and care plan provided today and agreed to receive a mailed copy of patient instructions, educational materials, and care plan.  Telephone follow up appointment with care management team member scheduled for:  Thursday, July 31, 2021 at 10:00 am The patient has been provided with contact information for the care management team and has been advised to call with any health related questions or concerns.   Carolyn Rack, RN, BSN, [facility] RN Care Coordination- [place] [phone]: direct office [phone]: mobile

## 2021-05-02 DIAGNOSIS — Z992 Dependence on renal dialysis: Secondary | ICD-10-CM | POA: Diagnosis not present

## 2021-05-02 DIAGNOSIS — N186 End stage renal disease: Secondary | ICD-10-CM | POA: Diagnosis not present

## 2021-05-02 DIAGNOSIS — N2581 Secondary hyperparathyroidism of renal origin: Secondary | ICD-10-CM | POA: Diagnosis not present

## 2021-05-05 DIAGNOSIS — N2581 Secondary hyperparathyroidism of renal origin: Secondary | ICD-10-CM | POA: Diagnosis not present

## 2021-05-05 DIAGNOSIS — Z992 Dependence on renal dialysis: Secondary | ICD-10-CM | POA: Diagnosis not present

## 2021-05-05 DIAGNOSIS — N186 End stage renal disease: Secondary | ICD-10-CM | POA: Diagnosis not present

## 2021-05-07 DIAGNOSIS — N2581 Secondary hyperparathyroidism of renal origin: Secondary | ICD-10-CM | POA: Diagnosis not present

## 2021-05-07 DIAGNOSIS — Z992 Dependence on renal dialysis: Secondary | ICD-10-CM | POA: Diagnosis not present

## 2021-05-07 DIAGNOSIS — N186 End stage renal disease: Secondary | ICD-10-CM | POA: Diagnosis not present

## 2021-05-09 DIAGNOSIS — Z992 Dependence on renal dialysis: Secondary | ICD-10-CM | POA: Diagnosis not present

## 2021-05-09 DIAGNOSIS — N186 End stage renal disease: Secondary | ICD-10-CM | POA: Diagnosis not present

## 2021-05-09 DIAGNOSIS — N2581 Secondary hyperparathyroidism of renal origin: Secondary | ICD-10-CM | POA: Diagnosis not present

## 2021-05-12 DIAGNOSIS — N186 End stage renal disease: Secondary | ICD-10-CM | POA: Diagnosis not present

## 2021-05-12 DIAGNOSIS — N2581 Secondary hyperparathyroidism of renal origin: Secondary | ICD-10-CM | POA: Diagnosis not present

## 2021-05-12 DIAGNOSIS — Z992 Dependence on renal dialysis: Secondary | ICD-10-CM | POA: Diagnosis not present

## 2021-05-12 NOTE — Progress Notes (Signed)
I, Carolyn Valenzuela, LAT, ATC, am serving as scribe for Dr. Lynne Leader.  Carolyn Valenzuela is a 70 y.o. female who presents to [place] at Carolyn Valenzuela today for f/u of R shoulder pain.  She was last seen by Dr. Georgina Valenzuela on 04/22/21 and noted only short-lived relief from her prior R GHJ injection on 03/11/21.  She was advised to return on or after 05/12/21 for a repeat injection and if no long-lasting relief, then next step is referral to ortho for shoulder replacement surgical consultation.  Today, pt reports that she suffered a fall this morning when trying to get to the bathroom hurting her bilat elbows. Pt reports R shoulder has been slightly sore. She denies any worse or more severe pain after the fall today.   Diagnostic imaging: R shoulder XR- 03/11/21  Pertinent review of systems: no fever or chills  Relevant historical information: Dialysis   Exam:  BP (!) 142/72   Pulse 64   Ht 5' (1.524 m)   SpO2 99%   BMI 26.21 kg/m  General: Well Developed, well nourished, and in no acute distress.   MSK: Right shoulder normal-appearing nontender decreased range of motion with crepitation.  Right elbow normal-appearing nontender normal motion intact strength.  Left elbow some swelling at posterior olecranon.  AV fistula left upper arm. Mildly tender to palpation.  Intact strength.  Normal range of motion.    Lab and Radiology Results  Procedure: Real-time Ultrasound Guided Injection of right shoulder glenohumeral joint posterior approach Device: Philips Affiniti 50G Images permanently stored and available for review in PACS Verbal informed consent obtained.  Discussed risks and benefits of procedure. Warned about infection bleeding damage to structures skin hypopigmentation and fat atrophy among others. Patient expresses understanding and agreement Time-out conducted.   Noted no overlying erythema, induration, or other signs of local infection.   Skin prepped in a sterile  fashion.   Local anesthesia: Topical Ethyl chloride.   With sterile technique and under real time ultrasound guidance: 40 mg of Kenalog and 2 mL of Marcaine injected into joint capsule. Fluid seen entering the joint.   Completed without difficulty   Pain immediately resolved suggesting accurate placement of the medication.   Advised to call if fevers/chills, erythema, induration, drainage, or persistent bleeding.   Images permanently stored and available for review in the ultrasound unit.  Impression: Technically successful ultrasound guided injection.     EXAM: RIGHT SHOULDER - 2+ VIEW   COMPARISON:  None.   FINDINGS: No acute fracture or dislocation. Severe glenohumeral joint osteoarthritis with complete joint space loss, subchondral sclerosis, and subchondral cystic changes upon both sides of the joint. Mild-moderate arthropathy of the [facility] joint. No soft tissue abnormality.   IMPRESSION: Severe right glenohumeral joint osteoarthritis.     Electronically Signed   By: Carolyn Valenzuela D.O.   On: 03/13/2021 14:18   I, Lynne Leader, personally (independently) visualized and performed the interpretation of the images attached in this note.      Assessment and Plan: 70 y.o. female with recurrent right shoulder pain.  This is a chronic issue.  Pain due to severe DJD.  Plan for repeat steroid injection today.  Can do this every 3 months. If the pain returns faster than 3 months next step should be consultation with orthopedic surgery to discuss her surgical options.  If she has not a surgical candidate due to her other health concerns may benefit from referral to pain management to discuss potential  nerve ablation.  Schedule follow-up appointment with me in 3 months for potential repeat injection.  Elbow pain after fall.  Contusion.  Developing mild olecranon bursitis left elbow.  Patient is not a good candidate for compression of the left elbow given her AV fistula on the left arm.   Watchful waiting.   PDMP not reviewed this encounter. Orders Placed This Encounter  Procedures   [place] LIMITED JOINT SPACE STRUCTURES UP RIGHT(NO LINKED CHARGES)    Standing Status:   Future    Number of Occurrences:   1    Standing Expiration Date:   11/10/2021    Order Specific Question:   Reason for Exam (SYMPTOM  OR DIAGNOSIS REQUIRED)    Answer:   right shoulder pain    Order Specific Question:   Preferred imaging location?    Answer:   [place]   No orders of the defined types were placed in this encounter.    Discussed warning signs or symptoms. Please see discharge instructions. Patient expresses understanding.   The above documentation has been reviewed and is accurate and complete Lynne Leader, M.D.

## 2021-05-13 ENCOUNTER — Other Ambulatory Visit: Payer: Self-pay

## 2021-05-13 ENCOUNTER — Ambulatory Visit: Payer: Self-pay

## 2021-05-13 ENCOUNTER — Ambulatory Visit (INDEPENDENT_AMBULATORY_CARE_PROVIDER_SITE_OTHER): Payer: Medicare PPO | Admitting: Family Medicine

## 2021-05-13 VITALS — BP 142/72 | HR 64 | Ht 60.0 in

## 2021-05-13 DIAGNOSIS — M1711 Unilateral primary osteoarthritis, right knee: Secondary | ICD-10-CM | POA: Diagnosis not present

## 2021-05-13 DIAGNOSIS — M25511 Pain in right shoulder: Secondary | ICD-10-CM

## 2021-05-13 NOTE — Patient Instructions (Signed)
Thank you for coming in today.   If this shot does not last very long then you should talk with an orthopedic surgeon about options.   I can do this injection every 3 months.   Ok to schedule with me in 3 months.   Let me know sooner if you have a problem.

## 2021-05-14 DIAGNOSIS — Z992 Dependence on renal dialysis: Secondary | ICD-10-CM | POA: Diagnosis not present

## 2021-05-14 DIAGNOSIS — N2581 Secondary hyperparathyroidism of renal origin: Secondary | ICD-10-CM | POA: Diagnosis not present

## 2021-05-14 DIAGNOSIS — N186 End stage renal disease: Secondary | ICD-10-CM | POA: Diagnosis not present

## 2021-05-16 DIAGNOSIS — N2581 Secondary hyperparathyroidism of renal origin: Secondary | ICD-10-CM | POA: Diagnosis not present

## 2021-05-16 DIAGNOSIS — Z992 Dependence on renal dialysis: Secondary | ICD-10-CM | POA: Diagnosis not present

## 2021-05-16 DIAGNOSIS — N186 End stage renal disease: Secondary | ICD-10-CM | POA: Diagnosis not present

## 2021-05-19 DIAGNOSIS — Z992 Dependence on renal dialysis: Secondary | ICD-10-CM | POA: Diagnosis not present

## 2021-05-19 DIAGNOSIS — N2581 Secondary hyperparathyroidism of renal origin: Secondary | ICD-10-CM | POA: Diagnosis not present

## 2021-05-19 DIAGNOSIS — N186 End stage renal disease: Secondary | ICD-10-CM | POA: Diagnosis not present

## 2021-05-21 DIAGNOSIS — N186 End stage renal disease: Secondary | ICD-10-CM | POA: Diagnosis not present

## 2021-05-21 DIAGNOSIS — Z992 Dependence on renal dialysis: Secondary | ICD-10-CM | POA: Diagnosis not present

## 2021-05-21 DIAGNOSIS — N2581 Secondary hyperparathyroidism of renal origin: Secondary | ICD-10-CM | POA: Diagnosis not present

## 2021-05-22 DIAGNOSIS — I120 Hypertensive chronic kidney disease with stage 5 chronic kidney disease or end stage renal disease: Secondary | ICD-10-CM | POA: Diagnosis not present

## 2021-05-22 DIAGNOSIS — Z89512 Acquired absence of left leg below knee: Secondary | ICD-10-CM | POA: Diagnosis not present

## 2021-05-22 DIAGNOSIS — Z992 Dependence on renal dialysis: Secondary | ICD-10-CM | POA: Diagnosis not present

## 2021-05-22 DIAGNOSIS — E1151 Type 2 diabetes mellitus with diabetic peripheral angiopathy without gangrene: Secondary | ICD-10-CM | POA: Diagnosis not present

## 2021-05-22 DIAGNOSIS — E113599 Type 2 diabetes mellitus with proliferative diabetic retinopathy without macular edema, unspecified eye: Secondary | ICD-10-CM | POA: Diagnosis not present

## 2021-05-22 DIAGNOSIS — N186 End stage renal disease: Secondary | ICD-10-CM | POA: Diagnosis not present

## 2021-05-22 DIAGNOSIS — E1122 Type 2 diabetes mellitus with diabetic chronic kidney disease: Secondary | ICD-10-CM | POA: Diagnosis not present

## 2021-05-22 DIAGNOSIS — E1140 Type 2 diabetes mellitus with diabetic neuropathy, unspecified: Secondary | ICD-10-CM | POA: Diagnosis not present

## 2021-05-22 DIAGNOSIS — Z89511 Acquired absence of right leg below knee: Secondary | ICD-10-CM | POA: Diagnosis not present

## 2021-05-23 DIAGNOSIS — Z992 Dependence on renal dialysis: Secondary | ICD-10-CM | POA: Diagnosis not present

## 2021-05-23 DIAGNOSIS — N2581 Secondary hyperparathyroidism of renal origin: Secondary | ICD-10-CM | POA: Diagnosis not present

## 2021-05-23 DIAGNOSIS — N186 End stage renal disease: Secondary | ICD-10-CM | POA: Diagnosis not present

## 2021-05-26 DIAGNOSIS — N2581 Secondary hyperparathyroidism of renal origin: Secondary | ICD-10-CM | POA: Diagnosis not present

## 2021-05-26 DIAGNOSIS — N186 End stage renal disease: Secondary | ICD-10-CM | POA: Diagnosis not present

## 2021-05-26 DIAGNOSIS — Z992 Dependence on renal dialysis: Secondary | ICD-10-CM | POA: Diagnosis not present

## 2021-05-27 ENCOUNTER — Telehealth: Payer: Self-pay

## 2021-05-27 NOTE — Telephone Encounter (Signed)
Ok for immodium otc prn, and consider OV or UC or ED for worsening pain, fever, blood or other unusual symptoms

## 2021-05-27 NOTE — Telephone Encounter (Signed)
Pt has stated she has had the runs for x3 days. Pt states her stool is like water. Pt states it is a non-stop flow. When she eats or drinks it comes right out.  **Pt denies any abdominal pains, nausea, fever, chills and sweats.'  **Pt would like a call back on what is the next steps she needs to take to stop her from using the bathroom. Pt can be contacted at [phone].

## 2021-05-28 DIAGNOSIS — Z992 Dependence on renal dialysis: Secondary | ICD-10-CM | POA: Diagnosis not present

## 2021-05-28 DIAGNOSIS — N2581 Secondary hyperparathyroidism of renal origin: Secondary | ICD-10-CM | POA: Diagnosis not present

## 2021-05-28 DIAGNOSIS — N186 End stage renal disease: Secondary | ICD-10-CM | POA: Diagnosis not present

## 2021-05-28 NOTE — Telephone Encounter (Signed)
I was able to inform pt of Dr.John's advice and she has stated she understood.

## 2021-05-30 DIAGNOSIS — N2581 Secondary hyperparathyroidism of renal origin: Secondary | ICD-10-CM | POA: Diagnosis not present

## 2021-05-30 DIAGNOSIS — N186 End stage renal disease: Secondary | ICD-10-CM

## 2021-05-30 DIAGNOSIS — I10 Essential (primary) hypertension: Secondary | ICD-10-CM

## 2021-05-30 DIAGNOSIS — Z992 Dependence on renal dialysis: Secondary | ICD-10-CM

## 2021-05-30 DIAGNOSIS — E1122 Type 2 diabetes mellitus with diabetic chronic kidney disease: Secondary | ICD-10-CM | POA: Diagnosis not present

## 2021-05-30 DIAGNOSIS — E1129 Type 2 diabetes mellitus with other diabetic kidney complication: Secondary | ICD-10-CM | POA: Diagnosis not present

## 2021-06-02 DIAGNOSIS — Z992 Dependence on renal dialysis: Secondary | ICD-10-CM | POA: Diagnosis not present

## 2021-06-02 DIAGNOSIS — N2581 Secondary hyperparathyroidism of renal origin: Secondary | ICD-10-CM | POA: Diagnosis not present

## 2021-06-02 DIAGNOSIS — N186 End stage renal disease: Secondary | ICD-10-CM | POA: Diagnosis not present

## 2021-06-04 DIAGNOSIS — Z992 Dependence on renal dialysis: Secondary | ICD-10-CM | POA: Diagnosis not present

## 2021-06-04 DIAGNOSIS — N186 End stage renal disease: Secondary | ICD-10-CM | POA: Diagnosis not present

## 2021-06-04 DIAGNOSIS — N2581 Secondary hyperparathyroidism of renal origin: Secondary | ICD-10-CM | POA: Diagnosis not present

## 2021-06-06 DIAGNOSIS — N186 End stage renal disease: Secondary | ICD-10-CM | POA: Diagnosis not present

## 2021-06-06 DIAGNOSIS — N2581 Secondary hyperparathyroidism of renal origin: Secondary | ICD-10-CM | POA: Diagnosis not present

## 2021-06-06 DIAGNOSIS — Z992 Dependence on renal dialysis: Secondary | ICD-10-CM | POA: Diagnosis not present

## 2021-06-09 DIAGNOSIS — N2581 Secondary hyperparathyroidism of renal origin: Secondary | ICD-10-CM | POA: Diagnosis not present

## 2021-06-09 DIAGNOSIS — N186 End stage renal disease: Secondary | ICD-10-CM | POA: Diagnosis not present

## 2021-06-09 DIAGNOSIS — Z992 Dependence on renal dialysis: Secondary | ICD-10-CM | POA: Diagnosis not present

## 2021-06-11 DIAGNOSIS — N2581 Secondary hyperparathyroidism of renal origin: Secondary | ICD-10-CM | POA: Diagnosis not present

## 2021-06-11 DIAGNOSIS — Z992 Dependence on renal dialysis: Secondary | ICD-10-CM | POA: Diagnosis not present

## 2021-06-11 DIAGNOSIS — N186 End stage renal disease: Secondary | ICD-10-CM | POA: Diagnosis not present

## 2021-06-13 DIAGNOSIS — N2581 Secondary hyperparathyroidism of renal origin: Secondary | ICD-10-CM | POA: Diagnosis not present

## 2021-06-13 DIAGNOSIS — N186 End stage renal disease: Secondary | ICD-10-CM | POA: Diagnosis not present

## 2021-06-13 DIAGNOSIS — Z992 Dependence on renal dialysis: Secondary | ICD-10-CM | POA: Diagnosis not present

## 2021-06-16 DIAGNOSIS — Z992 Dependence on renal dialysis: Secondary | ICD-10-CM | POA: Diagnosis not present

## 2021-06-16 DIAGNOSIS — N2581 Secondary hyperparathyroidism of renal origin: Secondary | ICD-10-CM | POA: Diagnosis not present

## 2021-06-16 DIAGNOSIS — N186 End stage renal disease: Secondary | ICD-10-CM | POA: Diagnosis not present

## 2021-06-18 DIAGNOSIS — N186 End stage renal disease: Secondary | ICD-10-CM | POA: Diagnosis not present

## 2021-06-18 DIAGNOSIS — N2581 Secondary hyperparathyroidism of renal origin: Secondary | ICD-10-CM | POA: Diagnosis not present

## 2021-06-18 DIAGNOSIS — Z992 Dependence on renal dialysis: Secondary | ICD-10-CM | POA: Diagnosis not present

## 2021-06-20 DIAGNOSIS — Z992 Dependence on renal dialysis: Secondary | ICD-10-CM | POA: Diagnosis not present

## 2021-06-20 DIAGNOSIS — N2581 Secondary hyperparathyroidism of renal origin: Secondary | ICD-10-CM | POA: Diagnosis not present

## 2021-06-20 DIAGNOSIS — N186 End stage renal disease: Secondary | ICD-10-CM | POA: Diagnosis not present

## 2021-06-23 DIAGNOSIS — N186 End stage renal disease: Secondary | ICD-10-CM | POA: Diagnosis not present

## 2021-06-23 DIAGNOSIS — Z992 Dependence on renal dialysis: Secondary | ICD-10-CM | POA: Diagnosis not present

## 2021-06-23 DIAGNOSIS — N2581 Secondary hyperparathyroidism of renal origin: Secondary | ICD-10-CM | POA: Diagnosis not present

## 2021-06-24 ENCOUNTER — Telehealth: Payer: Self-pay | Admitting: Internal Medicine

## 2021-06-24 ENCOUNTER — Ambulatory Visit: Payer: Medicare PPO | Admitting: Internal Medicine

## 2021-06-24 NOTE — Telephone Encounter (Signed)
Patients' sister calling to inform patient has an appointment today at 1:40  Sister states she wants to inform Dr. Jenny Reichmann  patient has fell out of the bed twice this week  Sister state patient was on the floor for 3 hours on both occasions unable to get up on her own  Sister states patient is becoming hard of hearing

## 2021-06-25 ENCOUNTER — Emergency Department (HOSPITAL_COMMUNITY): Payer: Medicare PPO

## 2021-06-25 ENCOUNTER — Encounter (HOSPITAL_COMMUNITY): Payer: Self-pay | Admitting: *Deleted

## 2021-06-25 ENCOUNTER — Inpatient Hospital Stay (HOSPITAL_COMMUNITY)
Admission: EM | Admit: 2021-06-25 | Discharge: 2021-07-01 | DRG: 444 | Disposition: A | Discharge status: Skilled Nursing Facility | Payer: Medicare PPO | Attending: Student | Admitting: Student

## 2021-06-25 ENCOUNTER — Other Ambulatory Visit: Payer: Self-pay

## 2021-06-25 DIAGNOSIS — E1122 Type 2 diabetes mellitus with diabetic chronic kidney disease: Secondary | ICD-10-CM | POA: Diagnosis present

## 2021-06-25 DIAGNOSIS — N186 End stage renal disease: Secondary | ICD-10-CM

## 2021-06-25 DIAGNOSIS — D631 Anemia in chronic kidney disease: Secondary | ICD-10-CM | POA: Diagnosis present

## 2021-06-25 DIAGNOSIS — Z7984 Long term (current) use of oral hypoglycemic drugs: Secondary | ICD-10-CM

## 2021-06-25 DIAGNOSIS — R109 Unspecified abdominal pain: Secondary | ICD-10-CM | POA: Diagnosis not present

## 2021-06-25 DIAGNOSIS — R5381 Other malaise: Secondary | ICD-10-CM | POA: Diagnosis not present

## 2021-06-25 DIAGNOSIS — I5042 Chronic combined systolic (congestive) and diastolic (congestive) heart failure: Secondary | ICD-10-CM | POA: Diagnosis present

## 2021-06-25 DIAGNOSIS — I120 Hypertensive chronic kidney disease with stage 5 chronic kidney disease or end stage renal disease: Secondary | ICD-10-CM | POA: Diagnosis not present

## 2021-06-25 DIAGNOSIS — E876 Hypokalemia: Secondary | ICD-10-CM | POA: Diagnosis present

## 2021-06-25 DIAGNOSIS — I10 Essential (primary) hypertension: Secondary | ICD-10-CM

## 2021-06-25 DIAGNOSIS — E785 Hyperlipidemia, unspecified: Secondary | ICD-10-CM | POA: Diagnosis present

## 2021-06-25 DIAGNOSIS — R197 Diarrhea, unspecified: Secondary | ICD-10-CM | POA: Diagnosis not present

## 2021-06-25 DIAGNOSIS — G9341 Metabolic encephalopathy: Secondary | ICD-10-CM | POA: Diagnosis present

## 2021-06-25 DIAGNOSIS — I255 Ischemic cardiomyopathy: Secondary | ICD-10-CM | POA: Diagnosis present

## 2021-06-25 DIAGNOSIS — N2581 Secondary hyperparathyroidism of renal origin: Secondary | ICD-10-CM | POA: Diagnosis present

## 2021-06-25 DIAGNOSIS — E871 Hypo-osmolality and hyponatremia: Secondary | ICD-10-CM | POA: Diagnosis present

## 2021-06-25 DIAGNOSIS — E1152 Type 2 diabetes mellitus with diabetic peripheral angiopathy with gangrene: Secondary | ICD-10-CM | POA: Diagnosis present

## 2021-06-25 DIAGNOSIS — K8050 Calculus of bile duct without cholangitis or cholecystitis without obstruction: Secondary | ICD-10-CM | POA: Diagnosis present

## 2021-06-25 DIAGNOSIS — Z87892 Personal history of anaphylaxis: Secondary | ICD-10-CM

## 2021-06-25 DIAGNOSIS — Z89512 Acquired absence of left leg below knee: Secondary | ICD-10-CM

## 2021-06-25 DIAGNOSIS — Z955 Presence of coronary angioplasty implant and graft: Secondary | ICD-10-CM

## 2021-06-25 DIAGNOSIS — Z7401 Bed confinement status: Secondary | ICD-10-CM | POA: Diagnosis not present

## 2021-06-25 DIAGNOSIS — R0902 Hypoxemia: Secondary | ICD-10-CM | POA: Diagnosis not present

## 2021-06-25 DIAGNOSIS — D259 Leiomyoma of uterus, unspecified: Secondary | ICD-10-CM | POA: Diagnosis not present

## 2021-06-25 DIAGNOSIS — K591 Functional diarrhea: Secondary | ICD-10-CM | POA: Diagnosis present

## 2021-06-25 DIAGNOSIS — I739 Peripheral vascular disease, unspecified: Secondary | ICD-10-CM | POA: Diagnosis not present

## 2021-06-25 DIAGNOSIS — Z992 Dependence on renal dialysis: Secondary | ICD-10-CM

## 2021-06-25 DIAGNOSIS — Z89511 Acquired absence of right leg below knee: Secondary | ICD-10-CM

## 2021-06-25 DIAGNOSIS — I700 Atherosclerosis of aorta: Secondary | ICD-10-CM | POA: Diagnosis not present

## 2021-06-25 DIAGNOSIS — I2510 Atherosclerotic heart disease of native coronary artery without angina pectoris: Secondary | ICD-10-CM | POA: Diagnosis present

## 2021-06-25 DIAGNOSIS — N250 Renal osteodystrophy: Secondary | ICD-10-CM | POA: Diagnosis not present

## 2021-06-25 DIAGNOSIS — E1151 Type 2 diabetes mellitus with diabetic peripheral angiopathy without gangrene: Secondary | ICD-10-CM | POA: Diagnosis present

## 2021-06-25 DIAGNOSIS — K8020 Calculus of gallbladder without cholecystitis without obstruction: Secondary | ICD-10-CM | POA: Diagnosis present

## 2021-06-25 DIAGNOSIS — T879 Unspecified complications of amputation stump: Secondary | ICD-10-CM | POA: Diagnosis not present

## 2021-06-25 DIAGNOSIS — I132 Hypertensive heart and chronic kidney disease with heart failure and with stage 5 chronic kidney disease, or end stage renal disease: Secondary | ICD-10-CM | POA: Diagnosis present

## 2021-06-25 DIAGNOSIS — K5730 Diverticulosis of large intestine without perforation or abscess without bleeding: Secondary | ICD-10-CM | POA: Diagnosis not present

## 2021-06-25 DIAGNOSIS — I252 Old myocardial infarction: Secondary | ICD-10-CM | POA: Diagnosis not present

## 2021-06-25 DIAGNOSIS — Z7902 Long term (current) use of antithrombotics/antiplatelets: Secondary | ICD-10-CM

## 2021-06-25 DIAGNOSIS — I080 Rheumatic disorders of both mitral and aortic valves: Secondary | ICD-10-CM | POA: Diagnosis present

## 2021-06-25 DIAGNOSIS — M6282 Rhabdomyolysis: Secondary | ICD-10-CM | POA: Diagnosis not present

## 2021-06-25 DIAGNOSIS — E875 Hyperkalemia: Secondary | ICD-10-CM | POA: Diagnosis present

## 2021-06-25 DIAGNOSIS — R7989 Other specified abnormal findings of blood chemistry: Secondary | ICD-10-CM | POA: Diagnosis not present

## 2021-06-25 DIAGNOSIS — Z79899 Other long term (current) drug therapy: Secondary | ICD-10-CM

## 2021-06-25 DIAGNOSIS — R410 Disorientation, unspecified: Secondary | ICD-10-CM | POA: Diagnosis present

## 2021-06-25 DIAGNOSIS — Z7982 Long term (current) use of aspirin: Secondary | ICD-10-CM

## 2021-06-25 DIAGNOSIS — J9811 Atelectasis: Secondary | ICD-10-CM | POA: Diagnosis not present

## 2021-06-25 DIAGNOSIS — I5032 Chronic diastolic (congestive) heart failure: Secondary | ICD-10-CM | POA: Diagnosis not present

## 2021-06-25 DIAGNOSIS — M898X9 Other specified disorders of bone, unspecified site: Secondary | ICD-10-CM | POA: Diagnosis present

## 2021-06-25 DIAGNOSIS — K828 Other specified diseases of gallbladder: Secondary | ICD-10-CM | POA: Diagnosis not present

## 2021-06-25 DIAGNOSIS — I517 Cardiomegaly: Secondary | ICD-10-CM | POA: Diagnosis not present

## 2021-06-25 DIAGNOSIS — E7800 Pure hypercholesterolemia, unspecified: Secondary | ICD-10-CM

## 2021-06-25 DIAGNOSIS — J189 Pneumonia, unspecified organism: Secondary | ICD-10-CM | POA: Diagnosis not present

## 2021-06-25 DIAGNOSIS — Z20822 Contact with and (suspected) exposure to covid-19: Secondary | ICD-10-CM | POA: Diagnosis present

## 2021-06-25 DIAGNOSIS — J90 Pleural effusion, not elsewhere classified: Secondary | ICD-10-CM | POA: Diagnosis not present

## 2021-06-25 DIAGNOSIS — Z885 Allergy status to narcotic agent status: Secondary | ICD-10-CM

## 2021-06-25 DIAGNOSIS — R1010 Upper abdominal pain, unspecified: Secondary | ICD-10-CM | POA: Diagnosis not present

## 2021-06-25 DIAGNOSIS — D638 Anemia in other chronic diseases classified elsewhere: Secondary | ICD-10-CM | POA: Diagnosis present

## 2021-06-25 DIAGNOSIS — E0840 Diabetes mellitus due to underlying condition with diabetic neuropathy, unspecified: Secondary | ICD-10-CM | POA: Diagnosis not present

## 2021-06-25 DIAGNOSIS — R6889 Other general symptoms and signs: Secondary | ICD-10-CM | POA: Diagnosis not present

## 2021-06-25 DIAGNOSIS — Z8701 Personal history of pneumonia (recurrent): Secondary | ICD-10-CM

## 2021-06-25 DIAGNOSIS — E1129 Type 2 diabetes mellitus with other diabetic kidney complication: Secondary | ICD-10-CM | POA: Diagnosis not present

## 2021-06-25 DIAGNOSIS — R531 Weakness: Secondary | ICD-10-CM | POA: Diagnosis not present

## 2021-06-25 DIAGNOSIS — K8080 Other cholelithiasis without obstruction: Secondary | ICD-10-CM | POA: Diagnosis not present

## 2021-06-25 LAB — COMPREHENSIVE METABOLIC PANEL
ALT: 32 U/L (ref 0–44)
ALT: 33 U/L (ref 0–44)
AST: 91 U/L — ABNORMAL HIGH (ref 15–41)
AST: 86 U/L — ABNORMAL HIGH (ref 15–41)
Albumin: 2.4 g/dL — ABNORMAL LOW (ref 3.5–5.0)
Albumin: 2.2 g/dL — ABNORMAL LOW (ref 3.5–5.0)
Alkaline Phosphatase: 124 U/L (ref 38–126)
Alkaline Phosphatase: 126 U/L (ref 38–126)
Anion gap: 14 (ref 5–15)
Anion gap: 13 (ref 5–15)
BUN: 8 mg/dL (ref 8–23)
BUN: 11 mg/dL (ref 8–23)
CO2: 30 mmol/L (ref 22–32)
CO2: 28 mmol/L (ref 22–32)
Calcium: 9.9 mg/dL (ref 8.9–10.3)
Calcium: 9.6 mg/dL (ref 8.9–10.3)
Chloride: 92 mmol/L — ABNORMAL LOW (ref 98–111)
Chloride: 93 mmol/L — ABNORMAL LOW (ref 98–111)
Creatinine, Ser: 4.84 mg/dL — ABNORMAL HIGH (ref 0.44–1.00)
Creatinine, Ser: 5.34 mg/dL — ABNORMAL HIGH (ref 0.44–1.00)
GFR, Estimated: 9 mL/min — ABNORMAL LOW (ref >60)
GFR, Estimated: 8 mL/min — ABNORMAL LOW (ref >60)
Glucose, Bld: 77 mg/dL (ref 70–99)
Glucose, Bld: 77 mg/dL (ref 70–99)
Potassium: 3.3 mmol/L — ABNORMAL LOW (ref 3.5–5.1)
Potassium: 3.2 mmol/L — ABNORMAL LOW (ref 3.5–5.1)
Sodium: 136 mmol/L (ref 135–145)
Sodium: 134 mmol/L — ABNORMAL LOW (ref 135–145)
Total Bilirubin: 1.2 mg/dL (ref 0.3–1.2)
Total Bilirubin: 1.1 mg/dL (ref 0.3–1.2)
Total Protein: 5.4 g/dL — ABNORMAL LOW (ref 6.5–8.1)
Total Protein: 5.2 g/dL — ABNORMAL LOW (ref 6.5–8.1)

## 2021-06-25 LAB — CBC WITH DIFFERENTIAL/PLATELET
Abs Immature Granulocytes: 0.03 10*3/uL (ref 0.00–0.07)
Basophils Absolute: 0.1 10*3/uL (ref 0.0–0.1)
Basophils Relative: 1 %
Eosinophils Absolute: 0.1 10*3/uL (ref 0.0–0.5)
Eosinophils Relative: 1 %
HCT: 33.4 % — ABNORMAL LOW (ref 36.0–46.0)
Hemoglobin: 11.2 g/dL — ABNORMAL LOW (ref 12.0–15.0)
Immature Granulocytes: 1 %
Lymphocytes Relative: 32 %
Lymphs Abs: 1.7 10*3/uL (ref 0.7–4.0)
MCH: 30.4 pg (ref 26.0–34.0)
MCHC: 33.5 g/dL (ref 30.0–36.0)
MCV: 90.5 fL (ref 80.0–100.0)
Monocytes Absolute: 0.5 10*3/uL (ref 0.1–1.0)
Monocytes Relative: 10 %
Neutro Abs: 3 10*3/uL (ref 1.7–7.7)
Neutrophils Relative %: 55 %
Platelets: 95 10*3/uL — ABNORMAL LOW (ref 150–400)
RBC: 3.69 MIL/uL — ABNORMAL LOW (ref 3.87–5.11)
RDW: 18.7 % — ABNORMAL HIGH (ref 11.5–15.5)
WBC: 5.3 10*3/uL (ref 4.0–10.5)
nRBC: 0 % (ref 0.0–0.2)

## 2021-06-25 LAB — CBG MONITORING, ED: Glucose-Capillary: 73 mg/dL (ref 70–99)

## 2021-06-25 LAB — LIPASE, BLOOD
Lipase: 31 U/L (ref 11–51)
Lipase: 29 U/L (ref 11–51)

## 2021-06-25 LAB — MAGNESIUM: Magnesium: 1.8 mg/dL (ref 1.7–2.4)

## 2021-06-25 LAB — HEMOGLOBIN A1C
Hgb A1c MFr Bld: 5.4 % (ref 4.8–5.6)
Mean Plasma Glucose: 108.28 mg/dL

## 2021-06-25 LAB — RESP PANEL BY RT-PCR (FLU A&B, COVID) ARPGX2
Influenza A by PCR: NEGATIVE
Influenza B by PCR: NEGATIVE
SARS Coronavirus 2 by RT PCR: NEGATIVE

## 2021-06-25 MED ORDER — INSULIN ASPART 100 UNIT/ML IJ SOLN: 0.0000 [IU] | INTRAMUSCULAR | Status: DC

## 2021-06-25 MED ORDER — ACETAMINOPHEN 325 MG PO TABS: 650.0000 mg | ORAL_TABLET | Freq: Four times a day (QID) | ORAL | Status: DC | PRN

## 2021-06-25 MED ORDER — SODIUM CHLORIDE 0.9 % IV SOLN: 250.0000 mL | INTRAVENOUS | Status: DC | PRN

## 2021-06-25 MED ORDER — SODIUM CHLORIDE 0.9% FLUSH
3.0000 mL | Freq: Two times a day (BID) | INTRAVENOUS | Status: DC
  Administered 2021-06-26 – 2021-07-01 (×13): 3 mL via INTRAVENOUS

## 2021-06-25 MED ORDER — BRIMONIDINE TARTRATE 0.15 % OP SOLN
1.0000 [drp] | Freq: Three times a day (TID) | OPHTHALMIC | Status: DC
  Administered 2021-06-26 – 2021-07-01 (×15): 1 [drp] via OPHTHALMIC
  Filled 2021-06-25: qty 5

## 2021-06-25 MED ORDER — ONDANSETRON HCL 4 MG PO TABS: 4.0000 mg | ORAL_TABLET | Freq: Four times a day (QID) | ORAL | Status: DC | PRN

## 2021-06-25 MED ORDER — SODIUM CHLORIDE 0.9% FLUSH: 3.0000 mL | INTRAVENOUS | Status: DC | PRN

## 2021-06-25 MED ORDER — NEBIVOLOL HCL 5 MG PO TABS
5.0000 mg | ORAL_TABLET | Freq: Every day | ORAL | Status: DC
  Administered 2021-06-27 – 2021-07-01 (×5): 5 mg via ORAL
  Filled 2021-06-25 (×6): qty 1

## 2021-06-25 MED ORDER — PANTOPRAZOLE SODIUM 40 MG PO TBEC
40.0000 mg | DELAYED_RELEASE_TABLET | Freq: Every day | ORAL | Status: DC
  Administered 2021-06-27 – 2021-07-01 (×5): 40 mg via ORAL
  Filled 2021-06-25 (×5): qty 1

## 2021-06-25 MED ORDER — ALBUTEROL SULFATE HFA 108 (90 BASE) MCG/ACT IN AERS
2.0000 | INHALATION_SPRAY | Freq: Four times a day (QID) | RESPIRATORY_TRACT | Status: DC | PRN
  Filled 2021-06-25: qty 6.7

## 2021-06-25 MED ORDER — SUCROFERRIC OXYHYDROXIDE 500 MG PO CHEW
500.0000 mg | CHEWABLE_TABLET | Freq: Two times a day (BID) | ORAL | Status: DC
  Administered 2021-06-26 – 2021-06-28 (×6): 500 mg via ORAL
  Filled 2021-06-25 (×8): qty 1

## 2021-06-25 MED ORDER — SODIUM CHLORIDE 0.9 % IV SOLN
1.0000 g | Freq: Once | INTRAVENOUS | Status: AC
  Administered 2021-06-25: 1 g via INTRAVENOUS
  Filled 2021-06-25: qty 10

## 2021-06-25 MED ORDER — CYCLOSPORINE 0.05 % OP EMUL
1.0000 [drp] | Freq: Two times a day (BID) | OPHTHALMIC | Status: DC
  Administered 2021-06-26 – 2021-07-01 (×12): 1 [drp] via OPHTHALMIC
  Filled 2021-06-25 (×13): qty 30

## 2021-06-25 MED ORDER — ACETAMINOPHEN 650 MG RE SUPP: 650.0000 mg | Freq: Four times a day (QID) | RECTAL | Status: DC | PRN

## 2021-06-25 MED ORDER — HYDROCODONE-ACETAMINOPHEN 5-325 MG PO TABS: 1.0000 | ORAL_TABLET | ORAL | Status: DC | PRN

## 2021-06-25 MED ORDER — ROSUVASTATIN CALCIUM 20 MG PO TABS: 40.0000 mg | ORAL_TABLET | Freq: Every day | ORAL | Status: DC

## 2021-06-25 MED ORDER — AMLODIPINE BESYLATE 5 MG PO TABS: 10.0000 mg | ORAL_TABLET | Freq: Every day | ORAL | Status: DC

## 2021-06-25 MED ORDER — ONDANSETRON HCL 4 MG/2ML IJ SOLN: 4.0000 mg | Freq: Four times a day (QID) | INTRAMUSCULAR | Status: DC | PRN

## 2021-06-25 MED ORDER — FENTANYL CITRATE PF 50 MCG/ML IJ SOSY: 12.5000 ug | PREFILLED_SYRINGE | INTRAMUSCULAR | Status: DC | PRN

## 2021-06-25 MED ORDER — IOHEXOL 300 MG/ML  SOLN
100.0000 mL | Freq: Once | INTRAMUSCULAR | Status: AC | PRN
  Administered 2021-06-25: 100 mL via INTRAVENOUS

## 2021-06-25 MED ORDER — FENTANYL CITRATE PF 50 MCG/ML IJ SOSY
50.0000 ug | PREFILLED_SYRINGE | Freq: Once | INTRAMUSCULAR | Status: AC
  Administered 2021-06-25: 50 ug via INTRAVENOUS
  Filled 2021-06-25: qty 1

## 2021-06-25 NOTE — ED Provider Notes (Signed)
Care of the patient assumed at the change of shift. Here for abdominal pain and diarrhea. CT concerning for biliary disease. Awaiting [place] Physical Exam  BP 135/66   Pulse 66   Temp 98.2 F (36.8 C)   Resp 14   SpO2 96%   Physical Exam Patient resting, complaining of some pain RUQ abdominal tenderness, not guarding ED Course/Procedures   Clinical Course as of 06/25/21 1909  Wed Jun 25, 2021  1707 [place] concerning for early cholecystitis vs choledocholithiasis. Will recheck LFTs, plan pain meds, Abx, admission for further evaluation including likely HIDA scan. Patient and family amenable.  [CS]  2824 Spoke with Dr. Posey Pronto, Nephrology who will follow for dialysis needs [CS]  1805 Repeat LFTs not significantly changed. Will discuss with Hospitalist.  [CS]  [place] Spoke with Dr. Roel Cluck, Hospitalist, who will evaluate for admission. Asks that we discuss with GI regarding additional workup. [CS]  1857 Spoke with Dr. Henrene Pastor, GI who recommends general surgery consult and they will see the patient if there are concerns about distal stones. Dr. Roel Cluck aware.  [CS]    Clinical Course User Index [CS] Truddie Hidden, MD    Procedures  MDM         Truddie Hidden, MD 06/25/21 Pauline Aus

## 2021-06-25 NOTE — H&P (Signed)
Carolyn Valenzuela:[id number] DOB: [birth year] DOA: 06/25/2021   PCP: Biagio Borg, MD   Outpatient Specialists:   CARDS: Eulas Post PA Quay Burow, MD { NEphrology:  [place] [facility]     Orthopedics Dr. Sharol Given Patient arrived to ER on 06/25/21 at 0759 Referred by Attending Truddie Hidden, MD   Patient coming from: home Lives alone,     Chief Complaint:   Chief Complaint  Patient presents with   Diarrhea   Weakness    HPI: Carolyn Valenzuela is a 70 y.o. female with medical history significant of ESRD on HD MWF, DM2, PAD Bilateral BKA, HTN, HLD, CAD.  Presented with  abd pain super pubic and superficial, still able to make some urine Diarrhea for the past 1 month She has been trying to adjust her diet to see if that would help No fever no chills  She feels her legs are feeling weak   LAst HD was yesterday No tobacco or EtOh Has  been vaccinated against COVID  and boosted   Initial COVID TEST  NEGATIVE   Lab Results  Component Value Date   [place] NEGATIVE 06/25/2021     Regarding pertinent Chronic problems:    Hyperlipidemia -  on statins Crestor Lipid Panel     Component Value Date/Time   CHOL 82 02/13/2021 1115   TRIG 59.0 02/13/2021 1115   HDL 39.80 02/13/2021 1115   CHOLHDL 2 02/13/2021 1115   VLDL 11.8 02/13/2021 1115   LDLCALC 30 02/13/2021 1115   LDLCALC 138 (H) 03/12/2020 1058   LDLDIRECT 86.0 04/06/2017 1131   PAD - sp Bilateral BKA on Plavix  HTN on Norvasc   chronic CHF diastolic/systolic/ combined -   88/08/1029 showed EF 40 to 45%, hypokinesis of the lateral and inferolateral myocardium, severe AI, moderate to severe MR, PA peak pressure 51 mmHg.  Echocardiogram was repeated on 10/12/2017, this revealed EF of 55 to 60%, grade 1 DD, no significant AI or MR were noted.  Echocardiogram obtained on 05/25/2019 showed EF 60 to 70%, trivial MR.    CAD  - On Aspirin, statin, betablocker, Plavix                 - *followed by cardiology               Cardiac catheterization performed on 06/30/2018 demonstrated 100% ostial OM2 occlusion, 99% ostial left circumflex lesion, 40% proximal left circumflex lesion, 60% mid left circumflex lesion, 70% ostial to proximal LAD lesion, 80% distal LAD lesion.  Given diffuse disease, patient was referred to CT surgery for consideration of bypass surgery.  Unfortunately she was turned down due to her comorbidities.  She was brought back to the Cath Lab on 07/02/2016 and underwent Synergy 2.5 x 16 mm DES to proximal left circumflex.  Postprocedure, patient was placed on aspirin and Plavix for minimum 12 months, however ideally indefinitely.     DM 2 -  Lab Results  Component Value Date   HGBA1C 6.4 02/13/2021   on  PO meds only,    ESRD on HD MWF  Lab Results  Component Value Date   CREATININE 5.34 (H) 06/25/2021   CREATININE 4.84 (H) 06/25/2021   CREATININE 4.92 (HH) 02/13/2021      Chronic anemia - baseline hg Hemoglobin & Hematocrit  Recent Labs    02/13/21 1115 06/25/21 0835  HGB 10.0* 11.2*   While in ER: No evidence of white blood cell count she did report transiently  right upper quadrant pain CT abdomen showed Cholelithiasis in a distended gallbladder, without other CT findings of cholecystitis  US showed Distended gallbladder with gallbladder wall thickening and multiple large gallstones and sludge. No sonographic Murphy sign per the technologist. Findings are indeterminate for acute Cholecystitis  Case discussed with Dr. Henrene Pastor who recommended general surgery consult Dr. Rosendo Gros was consulted who feels that clinically cholecystitis being less likely but to be sure can order HIDA scan and if positive reconsult general surgery   ED Triage Vitals  Enc Vitals Group     BP 06/25/21 0802 (!) 131/52     Pulse Rate 06/25/21 0802 68     Resp 06/25/21 0802 15     Temp 06/25/21 0802 98.2 F (36.8 C)     Temp src --      SpO2 06/25/21 0802 100 %     Weight --      Height --       Head Circumference --      Peak Flow --      Pain Score 06/25/21 0807 4     Pain Loc --      Pain Edu? --      Excl. in [place]? --   TMAX(24)@     _________________________________________ Significant initial  Findings: Abnormal Labs Reviewed  CBC WITH DIFFERENTIAL/PLATELET - Abnormal; Notable for the following components:      Result Value   RBC 3.69 (*)    Hemoglobin 11.2 (*)    HCT 33.4 (*)    RDW 18.7 (*)    Platelets 95 (*)    All other components within normal limits  COMPREHENSIVE METABOLIC PANEL - Abnormal; Notable for the following components:   Potassium 3.3 (*)    Chloride 92 (*)    Creatinine, Ser 4.84 (*)    Total Protein 5.4 (*)    Albumin 2.4 (*)    AST 91 (*)    GFR, Estimated 9 (*)    All other components within normal limits  COMPREHENSIVE METABOLIC PANEL - Abnormal; Notable for the following components:   Sodium 134 (*)    Potassium 3.2 (*)    Chloride 93 (*)    Creatinine, Ser 5.34 (*)    Total Protein 5.2 (*)    Albumin 2.2 (*)    AST 86 (*)    GFR, Estimated 8 (*)    All other components within normal limits   ____________________________________________ Ordered    CXR -. Probable small left pleural effusion.  CTabd/pelvis -pleural effusion generalized edema distended gallbladder CTA chest     Ultrasound abnormal although could not completely rule in cholecystitis HIDA scan ordered _________________________   ECG: Ordered Personally reviewed by me showing: HR : 67 Rhythm:  NSR,    no evidence of ischemic changes QTC 437   The recent clinical data is shown below. Vitals:   06/25/21 1700 06/25/21 1730 06/25/21 1800 06/25/21 1804  BP: (!) 137/52 (!) 136/59 (!) 94/48 120/65  Pulse: 66 65 64 65  Resp: '13 18 19 13  ' Temp:      SpO2: 94% 98% 98% 98%        Component Value Date/Time   WBC 5.3 06/25/2021 0835   LYMPHSABS 1.7 06/25/2021 0835   MONOABS 0.5 06/25/2021 0835   EOSABS 0.1 06/25/2021 0835   BASOSABS 0.1 06/25/2021 0835        Results for orders placed or performed during the hospital encounter of 06/29/16  Culture, blood (routine x 2)  Status: None   Collection Time: 06/29/16  1:45 PM   Specimen: BLOOD RIGHT HAND  Result Value Ref Range Status   Specimen Description BLOOD RIGHT HAND  Final   Special Requests IN PEDIATRIC BOTTLE 4CC  Final   Culture NO GROWTH 5 DAYS  Final   Report Status 07/04/2016 FINAL  Final  Culture, blood (routine x 2)     Status: None   Collection Time: 06/29/16  1:50 PM   Specimen: BLOOD RIGHT ARM  Result Value Ref Range Status   Specimen Description BLOOD RIGHT ARM  Final   Special Requests BOTTLES DRAWN AEROBIC AND ANAEROBIC 5CC  Final   Culture NO GROWTH 5 DAYS  Final   Report Status 07/04/2016 FINAL  Final  Surgical PCR screen     Status: None   Collection Time: 06/30/16  5:31 AM   Specimen: Nasal Mucosa; Nasal Swab  Result Value Ref Range Status   MRSA, PCR NEGATIVE NEGATIVE Final   Staphylococcus aureus NEGATIVE NEGATIVE Final    Comment:        The Xpert SA Assay (FDA approved for NASAL specimens in patients over 36 years of age), is one component of a comprehensive surveillance program.  Test performance has been validated by [facility] for patients greater than or equal to 25 year old. It is not intended to diagnose infection nor to guide or monitor treatment.     _______________________________________________________ ER Provider Called:   Nephrology Dr. Posey Pronto They Recommend admit to medicine   Will see in AM     _______________________________________________________ ER Provider Called: GI   Dr. Henrene Pastor They Recommend admit to medicine   Consult general surgery if we need to do any imaging _______________________________________________ Hospitalist was called for admission for diarrhea, fatigue, debility, abnormal right upper quad ultrasound  The following Work up has been ordered so far:  Orders Placed This Encounter  Procedures   Resp Panel  by RT-PCR (Flu A&B, Covid) Nasopharyngeal Swab   DG Chest Port 1 View   CT Abdomen Pelvis W Contrast   US Abdomen Limited   CT Chest Wo Contrast   CBC with Differential   Comprehensive metabolic panel   Lipase, blood   Magnesium   Comprehensive metabolic panel   Lipase, blood   Consult to nephrology   Consult to hospitalist   Consult to gastroenterology   EKG 12-Lead   Insert peripheral IV      Following Medications were ordered in ER: Medications  iohexol (OMNIPAQUE) 300 MG/ML solution 100 mL (100 mLs Intravenous Contrast Given 06/25/21 1250)  fentaNYL (SUBLIMAZE) injection 50 mcg (50 mcg Intravenous Given 06/25/21 1728)  cefTRIAXone (ROCEPHIN) 1 g in sodium chloride 0.9 % 100 mL IVPB (0 g Intravenous Stopped 06/25/21 1801)        Consult Orders  (From admission, onward)           Start     Ordered   06/25/21 1806  Consult to hospitalist  Paged by Nelva Bush  Once       Provider:  (Not yet assigned)  Question Answer Comment  Place call to: [facility]   Reason for Consult Admit      06/25/21 1805              OTHER Significant initial  Findings:  labs showing:    Recent Labs  Lab 06/25/21 0835 06/25/21 1659  NA 136 134*  K 3.3* 3.2*  CO2 30 28  GLUCOSE 77 77  BUN 8 11  CREATININE 4.84* 5.34*  CALCIUM 9.9 9.6  MG 1.8  --     Cr  * stable,  Up from baseline see below Lab Results  Component Value Date   CREATININE 5.34 (H) 06/25/2021   CREATININE 4.84 (H) 06/25/2021   CREATININE 4.92 (HH) 02/13/2021    Recent Labs  Lab 06/25/21 0835 06/25/21 1659  AST 91* 86*  ALT 32 33  ALKPHOS 124 126  BILITOT 1.2 1.1  PROT 5.4* 5.2*  ALBUMIN 2.4* 2.2*   Lab Results  Component Value Date   CALCIUM 9.6 06/25/2021   PHOS 4.7 (H) 03/20/2016          Plt: Lab Results  Component Value Date   PLT 95 (L) 06/25/2021      Recent Labs  Lab 06/25/21 0835  WBC 5.3  NEUTROABS 3.0  HGB 11.2*  HCT 33.4*  MCV 90.5  PLT 95*    HG/HCT  * stable,  Down *Up from baseline see below    Component Value Date/Time   HGB 11.2 (L) 06/25/2021 0835   HCT 33.4 (L) 06/25/2021 0835   MCV 90.5 06/25/2021 0835      Recent Labs  Lab 06/25/21 0835 06/25/21 1659  LIPASE 31 29   No results for input(s): AMMONIA in the last 168 hours.   Cardiac Panel (last 3 results) No results for input(s): CKTOTAL, CKMB, TROPONINI, RELINDX in the last 72 hours.   BNP (last 3 results) No results for input(s): BNP in the last 8760 hours.    DM  labs:  HbA1C: Recent Labs    02/13/21 1115  HGBA1C 6.4       CBG (last 3)  No results for input(s): GLUCAP in the last 72 hours.        Cultures:    Component Value Date/Time   SDES BLOOD RIGHT ARM 06/29/2016 1350   SPECREQUEST BOTTLES DRAWN AEROBIC AND ANAEROBIC 5CC 06/29/2016 1350   CULT NO GROWTH 5 DAYS 06/29/2016 1350   REPTSTATUS 07/04/2016 FINAL 06/29/2016 1350     Radiological Exams on Admission: CT Chest Wo Contrast  Result Date: 06/25/2021 CLINICAL DATA:  Pneumonia.  Weakness. EXAM: CT CHEST WITHOUT CONTRAST TECHNIQUE: Multidetector CT imaging of the chest was performed following the standard protocol without IV contrast. COMPARISON:  CT abdomen and pelvis 06/25/2021. Chest x-ray 06/25/2021. FINDINGS: Cardiovascular: The heart is mildly enlarged. Aorta is normal in size. There are atherosclerotic calcifications of the aorta and coronary arteries. There is no pericardial effusion. Left axillary vascular stent is partially visualized. Mediastinum/Nodes: There are nonenlarged paratracheal and prevascular lymph nodes. There is a mildly enlarged subcarinal lymph node measuring 11 mm short axis. Visualized thyroid gland and esophagus are within normal limits. Lungs/Pleura: There is a small amount of atelectasis in the bilateral lower lobes and lingula. There is a calcified granuloma in the right lower lobe. There is a small right pleural effusion and trace left pleural effusion. There is no  pneumothorax. Trachea and central airways are patent. Upper Abdomen: Gallstones are present. There are hypodensities in the kidneys which are too small to characterize, likely cysts. Musculoskeletal: There is diffuse body wall edema. Calcifications are seen in the bilateral breasts. There is skin thickening of the anterior and posterior chest wall. There are severe/end-stage degenerative changes of the right shoulder. No acute fractures are seen. IMPRESSION: 1. Small bilateral pleural effusions 2. Bilateral lower lobe and lingular atelectasis. 3. Cardiomegaly. 4. Mildly enlarged subcarinal lymph node. 5. Diffuse body wall edema. 6. Posterior and  anterior chest wall skin thickening. Correlate for infection. 7. Cholelithiasis. Aortic Atherosclerosis (ICD10-I70.0). Electronically Signed   By: Ronney Asters M.D.   On: 06/25/2021 15:14   CT Abdomen Pelvis W Contrast  Result Date: 06/25/2021 CLINICAL DATA:  Diffuse abdominal pain infection suspected. EXAM: CT ABDOMEN AND PELVIS WITH CONTRAST TECHNIQUE: Multidetector CT imaging of the abdomen and pelvis was performed using the standard protocol following bolus administration of intravenous contrast. CONTRAST:  185m OMNIPAQUE IOHEXOL 300 MG/ML  SOLN COMPARISON:  July 09, 2011 CT and March 12, 2020 lumbar spine radiograph. FINDINGS: Lower chest: Small right greater than left pleural effusions with adjacent atelectasis. Hepatobiliary: No suspicious hepatic lesion. Cholelithiasis in a distended gallbladder. No biliary ductal dilation. Pancreas: No pancreatic ductal dilation or evidence of acute inflammation Spleen: Within normal limits. Adrenals/Urinary Tract: Bilateral adrenal glands are unremarkable. No hydronephrosis. Atrophic kidneys. Hypodense 1.2 cm left renal cyst. Subcentimeter hypodense bilateral renal lesions technically too small to accurately characterize but statistically likely represent cysts. Urinary bladder is decompressed. Stomach/Bowel: Stomach is  unremarkable for degree of distension. No pathologic dilation of small or large bowel. The appendix and terminal ileum appear normal. Sigmoid colonic diverticulosis without findings of acute diverticulitis. Vascular/Lymphatic: Aortic and branch vessel atherosclerosis without abdominal aortic aneurysm. No pathologically enlarged abdominal or pelvic lymph nodes. Reproductive: Calcified uterine leiomyomas. No suspicious adnexal mass. Other: No significant abdominopelvic free fluid. Diffuse subcutaneous edema. Musculoskeletal: Vacuum disc artifact with endplate sclerosis and remodeling at L4-L5 similar to prior radiograph March 12, 2020. L5-S1 discogenic disease. IMPRESSION: 1. Cholelithiasis in a distended gallbladder, without other CT findings of cholecystitis. Consider further evaluation with right upper quadrant ultrasound if clinically indicated. 2. Small right greater than left pleural effusions with adjacent atelectasis. 3. Sigmoid colonic diverticulosis without findings of acute diverticulitis. 4. Aortic Atherosclerosis (ICD10-I70.0). Electronically Signed   By: JDahlia BailiffM.D.   On: 06/25/2021 13:39   UKoreaAbdomen Limited  Result Date: 06/25/2021 CLINICAL DATA:  CT shows gallstones and distended gallbladder EXAM: ULTRASOUND ABDOMEN LIMITED RIGHT UPPER QUADRANT COMPARISON:  CT June 25, 2021. FINDINGS: Gallbladder: Multiple gallstones, measuring up to 1.6 cm. Layering sludge within the gallbladder. No sonographic Murphy sign per technologist. Gallbladder wall thickening, measuring 3.9 mm. Common bile duct: Diameter: 6.7 mm, dilated. Liver:Heterogeneous echotexture, widened fissures and lobular cortex. No focal lesion identified. Portal vein is patent on color Doppler imaging with normal direction of blood flow towards the liver. Other: Echogenic partially imaged right kidney IMPRESSION: 1. Distended gallbladder with gallbladder wall thickening and multiple large gallstones and sludge. No sonographic  Murphy sign per the technologist. Findings are indeterminate for acute cholecystitis. HIDA scan could further evaluate if clinically uncertain. 2. Dilated common bile duct, measuring up to 6.7 mm. Recommend correlation with liver functions to exclude obstruction. MRCP could further characterize if clinically indicated. 3. Heterogeneous echotexture of the liver with widened fissures and lobular cortex of the liver. Question liver disease / developing cirrhosis. Recommend clinical correlation. 4. Echogenic partially imaged right kidney, possibly related to chronic kidney disease. Electronically Signed   By: FMargaretha SheffieldM.D.   On: 06/25/2021 16:52   DG Chest Port 1 View  Result Date: 06/25/2021 CLINICAL DATA:  Weakness diarrhea dialysis patient EXAM: PORTABLE CHEST 1 VIEW COMPARISON:  03/12/2020. FINDINGS: Enlarged cardiac silhouette. Streaky left basilar opacities. No visible pneumothorax. Probable small left pleural effusion. Severe right shoulder degenerative change. IMPRESSION: 1. Streaky left basilar opacities, favor atelectasis. Infection or aspiration is not excluded. 2. Probable small left pleural effusion.  3. Cardiomegaly. Electronically Signed   By: Margaretha Sheffield M.D.   On: 06/25/2021 13:08   _______________________________________________________________________________________________________ Latest  Blood pressure 120/65, pulse 65, temperature 98.2 F (36.8 C), resp. rate 13, SpO2 98 %.   Review of Systems:    Pertinent positives include:  fatigue diarrhea,  Constitutional:  No weight loss, night sweats, Fevers, chills, , weight loss  HEENT:  No headaches, Difficulty swallowing,Tooth/dental problems,Sore throat,  No sneezing, itching, ear ache, nasal congestion, post nasal drip,  Cardio-vascular:  No chest pain, Orthopnea, PND, anasarca, dizziness, palpitations.no Bilateral lower extremity swelling  GI:  No heartburn, indigestion, abdominal pain, nausea, vomiting,  change in  bowel habits, loss of appetite, melena, blood in stool, hematemesis Resp:  no shortness of breath at rest. No dyspnea on exertion, No excess mucus, no productive cough, No non-productive cough, No coughing up of blood.No change in color of mucus.No wheezing. Skin:  no rash or lesions. No jaundice GU:  no dysuria, change in color of urine, no urgency or frequency. No straining to urinate.  No flank pain.  Musculoskeletal:  No joint pain or no joint swelling. No decreased range of motion. No back pain.  Psych:  No change in mood or affect. No depression or anxiety. No memory loss.  Neuro: no localizing neurological complaints, no tingling, no weakness, no double vision, no gait abnormality, no slurred speech, no confusion  All systems reviewed and apart from [place] all are negative _______________________________________________________________________________________________ Past Medical History:   Past Medical History:  Diagnosis Date   Allergic rhinitis, cause unspecified 12/08/2010   ANEMIA-IRON DEFICIENCY 01/25/2008   Aortic regurgitation    CAD (coronary artery disease)    a. 07/2016 NSTEMI/PCI: LM nl, LAD 70ost, 64md, 80d, D1 40ost, LCX 99ost/p (2.5x16 Synergy DES - 2.75), 40/627mOm2 100 CTO, RCA  30p/m. Pt eval by CT surgery, not felt to be suitable candidate 2/2 comorbidities.   CERVICAL RADICULOPATHY, LEFT 01/03/30/4970 Chronic systolic CHF (congestive heart failure) ([place]   a. 07/2016 Echo: EF 40-45%, diff HK, sev AI, mild MVP w/ mod to sev MR, PASP 5117m.   Critical lower limb ischemia ([place]  DIABETES MELLITUS, UNCONTROLLED 05/21/2009   Esophagitis    [facility]  Foot ulcer ([place]  right lateral malleolus   HCAP (healthcare-associated pneumonia) 06/29/2016   HYPERLIPIDEMIA 03/30/2007   HYPERTENSION 01/25/2008   Ischemic cardiomyopathy    a. 07/2016 Echo: EF 40-45%.   Mitral regurgitation    a. 07/2016 Echo: Mild MVP w/ mod to sev MR.   PVD (peripheral  vascular disease) ([place]  a. s/p bilat BKA   SECONDARY HYPERPARATHYROIDISM 05/21/2009   Severe aortic regurgitation    a. 07/2016 Echo: Severe AI.      Past Surgical History:  Procedure Laterality Date   AMPUTATION Right 03/15/2014   Procedure: RIGHT  LEG  BELOW KNEE AMPUTATION ;  Surgeon: JohWylene SimmerD;  [facility];  Laterality: Right;   AMPUTATION Left 11/29/2015   Procedure: AMPUTATION BELOW KNEE;  Surgeon: MarNewt MinionD;  [facility];  Laterality: Left;   AV FISTULA PLACEMENT Left    AV FISTULA PLACEMENT Left 09/06/2014   Procedure: ARTERIOVENOUS (AV) FISTULA CREATION-Left Brachiocephalic;  Surgeon: BriConrad BurlingtonD;  [facility];  Laterality: Left;   CARDIAC CATHETERIZATION N/A 06/30/2016   Procedure: Left Heart Cath and Coronary Angiography;  Surgeon: ChrHarrell Gave  Santina Evans, MD;  Location: [place] CV LAB;  Service: Cardiovascular;  Laterality: N/A;   CARDIAC CATHETERIZATION N/A 07/02/2016   Procedure: Coronary Stent Intervention;  Surgeon: Nelva Bush, MD;  Location: [place] CV LAB;  Service: Cardiovascular;  Laterality: N/A;   CATARACT EXTRACTION     COLONOSCOPY     CORONARY ANGIOPLASTY WITH STENT PLACEMENT  07/02/2016   ESOPHAGOGASTRODUODENOSCOPY N/A 03/20/2016   Procedure: ESOPHAGOGASTRODUODENOSCOPY (EGD);  Surgeon: Milus Banister, MD;  Location: [place];  Service: Endoscopy;  Laterality: N/A;   EYE SURGERY Bilateral    cataracts removed, left eye still has some oil in it.   INSERTION OF DIALYSIS CATHETER N/A 09/03/2014   Procedure: INSERTION OF DIALYSIS CATHETER RIGHT INTERNAL JUGULAR VEIN;  Surgeon: Conrad Sanborn, MD;  Location: [place];  Service: Vascular;  Laterality: N/A;   PERIPHERAL VASCULAR CATHETERIZATION N/A 08/13/2015   Procedure: Abdominal Aortogram;  Surgeon: Elam Dutch, MD;  Location: [place] CV LAB;  Service: Cardiovascular;  Laterality: N/A;    Social  History:  Ambulatory  walker      reports that she has never smoked. She has never used smokeless tobacco. She reports that she does not drink alcohol and does not use drugs.     Family History:   Family History  Problem Relation Age of Onset   Cancer Mother        Pancreatic   ______________________________________________________________________________________________ Allergies: Allergies  Allergen Reactions   Oxycodone Itching     Prior to Admission medications   Medication Sig Start Date End Date Taking? Authorizing Provider  ACCU-CHEK GUIDE test strip USE TO CHECK BLOOD SUGARS 2 TIMES A DAY. DX: E11.9 03/05/21   Biagio Borg, MD  Accu-Chek Softclix Lancets lancets Use to check blood sugars 2x a day. DX: E11.9 02/09/20   Biagio Borg, MD  acetaminophen (TYLENOL) 500 MG tablet Take 500 mg by mouth every 6 (six) hours as needed for mild pain.    [provider]  albuterol (VENTOLIN HFA) 108 (90 Base) MCG/ACT inhaler Inhale 2 puffs into the lungs every 6 (six) hours as needed for wheezing or shortness of breath. 02/13/21   Biagio Borg, MD  ALPHAGAN P 0.1 % SOLN Apply 1 drop to eye 3 (three) times daily. 11/18/20   [provider]  amLODipine (NORVASC) 10 MG tablet TAKE 1 TABLET BY MOUTH EVERYDAY AT BEDTIME 08/13/20   Biagio Borg, MD  aspirin 81 MG EC tablet Take 81 mg by mouth daily.    [provider]  Blood Glucose Monitoring Suppl (ACCU-CHEK GUIDE ME) w/Device KIT USE DEVICE TO CHECK BLOOD SUGARS 2X A DAY. DX: E11.9 03/11/20   Biagio Borg, MD  clopidogrel (PLAVIX) 75 MG tablet Take 1 tablet (75 mg total) by mouth daily. 01/14/21   Almyra Deforest, PA  doxycycline (VIBRA-TABS) 100 MG tablet Take 1 tablet (100 mg total) by mouth 2 (two) times daily. 02/13/21   Biagio Borg, MD  gabapentin (NEURONTIN) 100 MG capsule TAKE 2 CAPSULES BY MOUTH 3 TIMES DAILY. 02/02/21   Biagio Borg, MD  glipiZIDE (GLUCOTROL XL) 10 MG 24 hr tablet TAKE 1 TABLET (10 MG TOTAL) BY  MOUTH DAILY WITH BREAKFAST. 03/24/21   Biagio Borg, MD  Influenza Vac High-Dose Quad (FLUZONE HIGH-DOSE QUADRIVALENT IM)  09/29/19   [provider]  JANUVIA 100 MG tablet TAKE 1 TABLET BY MOUTH EVERY DAY 08/13/20   Biagio Borg, MD  Methoxy PEG-Epoetin Beta [facility]  IJ) Mircera 07/29/20 12/08/21  [provider]  multivitamin (RENA-VIT) TABS tablet TAKE 1 TABLET BY MOUTH EVERYDAY AT BEDTIME 09/22/20   Biagio Borg, MD  nebivolol (BYSTOLIC) 5 MG tablet Take 1 tablet (5 mg total) by mouth daily. 09/12/20   Lorretta Harp, MD  pantoprazole (PROTONIX) 40 MG tablet TAKE 1 TABLET BY MOUTH TWICE A DAY 03/24/21   Biagio Borg, MD  RESTASIS 0.05 % ophthalmic emulsion 1 drop 2 (two) times daily. 08/29/20   [provider]  rosuvastatin (CRESTOR) 40 MG tablet TAKE 1 TABLET BY MOUTH EVERY DAY 12/20/20   Biagio Borg, MD  [facility] 500 MG chewable tablet  03/04/20   [provider]    ___________________________________________________________________________________________________ Physical Exam: Vitals with BMI 06/25/2021 06/25/2021 06/25/2021  Height - - -  Weight - - -  BMI - - -  Systolic 400 94 867  Diastolic 65 48 59  Pulse 65 64 65     1. General:  in No Acute distress   Chronically ill   -appearing 2. Psychological: Alert and   Oriented 3. Head/ENT:   Moist  Mucous Membranes                          Head Non traumatic, neck supple                           Poor Dentition 4. SKIN:  decreased Skin turgor,  Skin clean Dry and intact no rash 5. Heart: Regular rate and rhythm no  Murmur, no Rub or gallop 6. Lungs:  , no wheezes or crackles   7. Abdomen: Soft, suprapubic superficial pannus tender, Non distended  obese   bowel sounds present 8. Lower extremities: no clubbing, cyanosis, no  edema 9. Neurologically Grossly intact, moving all 4 extremities equally   10. MSK: Normal range of motion    Chart has been  reviewed  ______________________________________________________________________________________________  Assessment/Plan 70 y.o. female with medical history significant of ESRD on HD MWF, DM2  PAD Bilateral BKA, HTN, HLD, CAD.  Admitted for Dirrhea abd pain, choledocolethiasis  Present on Admission:  Choledocholithiasis no evidence of infection at this time HIDA scan ordered.  Follow-up results if positive would consult general surgery and then may need antibiotics but at this point infection is very unlikely.  Diarrhea will have gastric panel ordered as well as C. difficile   Abdominal pain -on my examination patient reported pain more over suprapubic.  Appears to be related to soft tissue edema on palpation.  At this point she denies any right upper quadrant pain but did endorse that earlier   Anemia of chronic disease -chronic stable     Essential (primary) hypertension restart beta-blocker hold off on Norvasc given somewhat soft blood pressures     Hyperlipidemia restart home medications when able to tolerate   Hypokalemia as per nephrology   Secondary hyperparathyroidism of renal origin ([place]) continue home meds when able to tolerate   Type 2 diabetes mellitus with diabetic peripheral angiopathy and gangrene, without long-term current use of insulin ([place]) Hold home medications noted to have some episodes of hypoglycemia.  May need adjustment of home medications to avoid hypoglycemia at home.  Frequent CBGs and treat as needed   History of CAD and PAD.  Patient is on Plavix hold off for tonight while being worked up for cholelithiasis restart statin and beta-blocker when able to tolerate  End-stage renal disease on hemodialysis nephrology is aware plan to hemodialyzed in the morning  Other plan as per orders.  DVT prophylaxis:  SCD     Code Status:    Code Status: Prior FULL CODE  I had personally discussed CODE STATUS with patient      Family Communication:    Family not at  Bedside    Disposition Plan:  To home once workup is complete and patient is stable   Following barriers for discharge:                            Electrolytes corrected                                                            Pain controlled with PO medications                                                          Will need to be able to tolerate PO                                              Will need consultants to evaluate patient prior to discharge                    Would benefit from PT/OT eval prior to DC  Ordered                                       Consults called: Renal, GI LB aware but rec Gen Surgery consult  Admission status:  ED Disposition     ED Disposition  [place]: [place] [[id number]]  Level of Care: Telemetry Medical [104]  May admit patient to [facility] or [facility] if equivalent level of care is available:: No  Covid Evaluation: Asymptomatic Screening Protocol (No Symptoms)  Diagnosis: Choledocholithiasis [903009]  Admitting Physician: Toy Baker [[id number]]  Attending Physician: Toy Baker [[id number]]  Estimated length of stay: past midnight tomorrow  Certification:: I certify this patient will need inpatient services for at least 2 midnights           inpatient     I Expect 2 midnight stay secondary to severity of patient's current illness need for inpatient interventions justified by the following:    Severe lab/radiological/exam abnormalities including:   Abnormal right upper quadrant ultrasound and evidence of fluid overload and extensive comorbidities including:    DM2    CHF   CAD   ESRD    That are currently affecting medical management.   I expect  patient to be hospitalized for 2 midnights requiring inpatient medical care.  Patient is at high risk for adverse outcome (such as loss of life or disability) if not treated.  Indication for  inpatient stay as  follows:   inability to maintain oral hydration    Need for operative/procedural  intervention    Need for , IV pain medications, HD    Level of care     tele  For  24H     Lab Results  Component Value Date   [place] 06/25/2021     Precautions: admitted as  Covid Negative   PPE: Used by the provider:   N95  eye Goggles,  Gloves       Jasdeep Dejarnett 06/26/2021, 1:55 AM   [facility]     after 2 AM please page floor coverage PA If 7AM-7PM, please contact the day team taking care of the patient using [email or web address]   Patient was evaluated in the context of the global COVID-19 pandemic, which necessitated consideration that the patient might be at risk for infection with the SARS-CoV-2 virus that causes COVID-19. Institutional protocols and algorithms that pertain to the evaluation of patients at risk for COVID-19 are in a state of rapid change based on information released by regulatory bodies including the CDC and federal and state organizations. These policies and algorithms were followed during the patient's care.

## 2021-06-25 NOTE — ED Provider Notes (Signed)
Emergency Medicine Provider Triage Evaluation Note  Carolyn Valenzuela , a 70 y.o. female  was evaluated in triage.  She has a history of ESRD Monday/Wednesday/Friday.  She pt complains of ongoing nonbloody diarrhea over the past month.  She denies fever, vomiting, chest pain, shortness of breath.  She denies abdominal pains.  No treatments prior to arrival.  She reports generalized fatigue and weakness.  She states that she had a normal dialysis session 2 days ago and wanted to come get her diarrhea checked on today instead of going to dialysis.  Review of Systems  Positive: Weakness, diarrhea Negative: Fever, abdominal pain  Physical Exam  BP (!) 131/52 (BP Location: Right Arm)   Pulse 68   Temp 98.2 F (36.8 C)   Resp 15   SpO2 95%  Gen:   Awake, no distress   Resp:  Normal effort  MSK:   Moves extremities without difficulty  Other:  Abdomen is soft and nontender  Medical Decision Making  Medically screening exam initiated at 8:35 AM.  Appropriate orders placed.  NIRVI BOEHLER was informed that the remainder of the evaluation will be completed by another provider, this initial triage assessment does not replace that evaluation, and the importance of remaining in the ED until their evaluation is complete.     Carlisle Cater, PA-C 06/25/21 [id number]    Fredia Sorrow, MD 06/26/21 [phone]

## 2021-06-25 NOTE — ED Triage Notes (Signed)
Pt arrived by [facility] from home. Reports diarrhea and generalized weakness x 1 month. Last dialysis treatment was Monday. Reports pain and difficulty moving all extremities.

## 2021-06-25 NOTE — ED Provider Notes (Signed)
[facility] EMERGENCY DEPARTMENT Provider Note   CSN: [id number] Arrival date & time: 06/25/21  0759     History Chief Complaint  Patient presents with   Diarrhea   Weakness    Carolyn Valenzuela is a 70 y.o. female.  Patient is a dialysis patient has been receiving dialysis for about 5 years.  Patient is had diarrhea for about a month.  Her dialysis days are normally Monday Wednesdays and Fridays.  She states her appetites been down as well.  Patient has not been on antibiotics recently.  Denies any blood in her bowel movements.  Patient normally dialyzed Monday Wednesdays and Fridays.  Was last dialyzed on Monday obviously missing today.  Patient has an AV fistula in her left upper extremity.  Patient is also bilateral below the knee amputee.  Past medical history also significant for coronary artery disease hyperlipidemia hypertension mitral regurgitation peripheral vascular disease and diabetes.      Past Medical History:  Diagnosis Date   Allergic rhinitis, cause unspecified 12/08/2010   ANEMIA-IRON DEFICIENCY 01/25/2008   Aortic regurgitation    CAD (coronary artery disease)    a. 07/2016 NSTEMI/PCI: LM nl, LAD 70ost, 1md, 80d, D1 40ost, LCX 99ost/p (2.5x16 Synergy DES - 2.75), 40/668mOm2 100 CTO, RCA  30p/m. Pt eval by CT surgery, not felt to be suitable candidate 2/2 comorbidities.   CERVICAL RADICULOPATHY, LEFT 01/04/77/2423 Chronic systolic CHF (congestive heart failure) (HCWest [place]   a. 07/2016 Echo: EF 40-45%, diff HK, sev AI, mild MVP w/ mod to sev MR, PASP 5143m.   Critical lower limb ischemia (HCCRanier  DIABETES MELLITUS, UNCONTROLLED 05/21/2009   Esophagitis    [facility]  Foot ulcer (HCCViola  right lateral malleolus   HCAP (healthcare-associated pneumonia) 06/29/2016   HYPERLIPIDEMIA 03/30/2007   HYPERTENSION 01/25/2008   Ischemic cardiomyopathy    a. 07/2016 Echo: EF 40-45%.   Mitral regurgitation    a. 07/2016 Echo: Mild MVP w/ mod to  sev MR.   PVD (peripheral vascular disease) (HCCWetmore  a. s/p bilat BKA   SECONDARY HYPERPARATHYROIDISM 05/21/2009   Severe aortic regurgitation    a. 07/2016 Echo: Severe AI.    Patient Active Problem List   Diagnosis Date Noted   Lump of right breast 02/16/2021   Gait disorder 02/16/2021   Finger pain 02/16/2021   Right shoulder pain 02/16/2021   Other disorders of plasma-protein metabolism, not elsewhere classified 01/13/2021   Encounter for screening for COVID-19 04/22/2020   Allergy, unspecified, initial encounter 03/28/2020   Anaphylactic shock, unspecified, initial encounter 03/28/2020   Chronic low back pain 03/12/2020   Closed displaced fracture of shaft of fifth metacarpal bone of left hand 04/25/2019   Left hip pain 11/18/2017   GERD (gastroesophageal reflux disease) 10/10/2017   Bloating 10/10/2017   PVD (peripheral vascular disease) (HCCEmison  Phantom pain after amputation of lower extremity (HCCSturgis5/03/2017   Encounter for removal of sutures 12/01/2016   Abnormal CXR 07/07/2016   Pneumonia, unspecified organism 07/07/2016   Hypotension    NSTEMI (non-ST elevated myocardial infarction) (HCCTemecula  Encounter for immunization 05/27/2016   Nausea and vomiting 03/18/2016   Hypokalemia 01/31/2016   Sepsis (HCCLongue to UTI 12/20/2015   Acute lower UTI    Labile blood pressure    SIRS (systemic inflammatory response syndrome) (HCCNorth Hodge  Hypoglycemia associated with diabetes (HCCSioux  Urinary retention  Dysphagia    S/P bilateral BKA (below knee amputation) (HCC)    Abnormality of gait    Muscle spasm    ESRD on dialysis ([place])    Type 2 diabetes mellitus with diabetic peripheral angiopathy and gangrene, without long-term current use of insulin (HCC)    Post-operative pain    Metabolic bone disease    Hypoxia    Type 2 diabetes mellitus with peripheral neuropathy (HCC)    Labile blood glucose    Postoperative pain    Acute blood loss anemia    Anemia of chronic disease     Below knee amputation status 11/29/2015   Odynophagia 11/12/2015   Unspecified disorder of calcium metabolism 07/31/2015   Chest pain, atypical 06/06/2015   Ingrown nail 05/28/2015   Other fluid overload 02/15/2015   Shortness of breath 12/13/2014   Diarrhea, unspecified 12/13/2014   Pain, unspecified 12/13/2014   Pruritus, unspecified 12/13/2014   Unspecified protein-calorie malnutrition ([place]) 10/17/2014   Acute pulmonary edema (HCC)    FUO (fever of unknown origin)    Complication of vascular dialysis catheter 09/10/2014   Fever, unspecified 09/10/2014   HCAP (healthcare-associated pneumonia)    Coagulation defect, unspecified ([place]) 09/06/2014   Radiculopathy, cervical region 09/06/2014   End stage renal disease ([place]) 09/06/2014   Essential (primary) hypertension 09/06/2014   Hyperlipidemia, unspecified 09/06/2014   Encounter for screening for respiratory tuberculosis 09/06/2014   Secondary hyperparathyroidism of renal origin ([place]) 09/06/2014   Anemia in chronic kidney disease 09/06/2014   Type 2 diabetes mellitus with ESRD (end-stage renal disease) ([place]) 09/01/2014   Anemia of renal disease 09/01/2014   Venous insufficiency of left leg 08/27/2014   Rash 07/06/2014   Diabetic wet gangrene of the foot - Right 03/15/2014   Critical lower limb ischemia ([place]) 01/09/2014   Right foot ulcer ([place]) 11/07/2013   Toe pain 09/06/2013   Pre-ulcerative corn or callous 09/06/2013   Lower extremity pain 09/06/2013   Low back pain 12/08/2011   Hip bursitis, left 12/08/2011   Left leg pain 12/08/2011   Abdominal pain, other specified site 07/08/2011   Encounter for well adult exam with abnormal findings 12/08/2010   Allergic rhinitis, cause unspecified 12/08/2010   Type 2 diabetes mellitus with diabetic chronic kidney disease ([place]) 05/21/2009   SECONDARY HYPERPARATHYROIDISM 05/21/2009   NUMBNESS 05/21/2009   BACK PAIN 05/02/2009   ANEMIA-IRON DEFICIENCY 01/25/2008   Essential  hypertension 01/25/2008   Brachial neuritis or radiculitis 01/25/2008   Hyperlipidemia 03/30/2007    Past Surgical History:  Procedure Laterality Date   AMPUTATION Right 03/15/2014   Procedure: RIGHT  LEG  BELOW KNEE AMPUTATION ;  Surgeon: [name], MD;  Location: [place];  Service: Orthopedics;  Laterality: Right;   AMPUTATION Left 11/29/2015   Procedure: AMPUTATION BELOW KNEE;  Surgeon: Newt Minion, MD;  Location: [place];  Service: Orthopedics;  Laterality: Left;   AV FISTULA PLACEMENT Left    AV FISTULA PLACEMENT Left 09/06/2014   Procedure: ARTERIOVENOUS (AV) FISTULA CREATION-Left Brachiocephalic;  Surgeon: Conrad [place]ondale, MD;  Location: [place];  Service: Vascular;  Laterality: Left;   CARDIAC CATHETERIZATION N/A 06/30/2016   Procedure: Left Heart Cath and Coronary Angiography;  Surgeon: Burnell Blanks, MD;  Location: [place] [facility];  Service: Cardiovascular;  Laterality: N/A;   CARDIAC CATHETERIZATION N/A 07/02/2016   Procedure: Coronary Stent Intervention;  Surgeon: Nelva Bush, MD;  Location: [place] [facility];  Service: Cardiovascular;  Laterality: N/A;   CATARACT EXTRACTION  COLONOSCOPY     CORONARY ANGIOPLASTY WITH STENT PLACEMENT  07/02/2016   ESOPHAGOGASTRODUODENOSCOPY N/A 03/20/2016   Procedure: ESOPHAGOGASTRODUODENOSCOPY (EGD);  Surgeon: Milus Banister, MD;  Location: [place];  Service: Endoscopy;  Laterality: N/A;   EYE SURGERY Bilateral    cataracts removed, left eye still has some oil in it.   INSERTION OF DIALYSIS CATHETER N/A 09/03/2014   Procedure: INSERTION OF DIALYSIS CATHETER RIGHT INTERNAL JUGULAR VEIN;  Surgeon: Conrad [place], MD;  Location: [place];  Service: Vascular;  Laterality: N/A;   PERIPHERAL VASCULAR CATHETERIZATION N/A 08/13/2015   Procedure: Abdominal Aortogram;  Surgeon: Elam Dutch, MD;  Location: [place] [facility];  Service: Cardiovascular;  Laterality: N/A;     OB History   No obstetric history on file.     Family History   Problem Relation Age of Onset   Cancer Mother        Pancreatic    Social History   Tobacco Use   Smoking status: Never   Smokeless tobacco: Never  Vaping Use   Vaping Use: Never used  Substance Use Topics   Alcohol use: No    Alcohol/week: 0.0 standard drinks   Drug use: No    Home Medications Prior to Admission medications   Medication Sig Start Date End Date Taking? Authorizing Provider  ACCU-CHEK GUIDE test strip USE TO CHECK BLOOD SUGARS 2 TIMES A DAY. DX: E11.9 03/05/21   Biagio Borg, MD  Accu-Chek Softclix Lancets lancets Use to check blood sugars 2x a day. DX: E11.9 02/09/20   Biagio Borg, MD  acetaminophen (TYLENOL) 500 MG tablet Take 500 mg by mouth every 6 (six) hours as needed for mild pain.    [provider]  albuterol (VENTOLIN HFA) 108 (90 Base) MCG/ACT inhaler Inhale 2 puffs into the lungs every 6 (six) hours as needed for wheezing or shortness of breath. 02/13/21   Biagio Borg, MD  ALPHAGAN P 0.1 % SOLN Apply 1 drop to eye 3 (three) times daily. 11/18/20   [provider]  amLODipine (NORVASC) 10 MG tablet TAKE 1 TABLET BY MOUTH EVERYDAY AT BEDTIME 08/13/20   Biagio Borg, MD  aspirin 81 MG EC tablet Take 81 mg by mouth daily.    [provider]  Blood Glucose Monitoring Suppl (ACCU-CHEK GUIDE ME) w/Device KIT USE DEVICE TO CHECK BLOOD SUGARS 2X A DAY. DX: E11.9 03/11/20   Biagio Borg, MD  clopidogrel (PLAVIX) 75 MG tablet Take 1 tablet (75 mg total) by mouth daily. 01/14/21   Almyra Deforest, PA  doxycycline (VIBRA-TABS) 100 MG tablet Take 1 tablet (100 mg total) by mouth 2 (two) times daily. 02/13/21   Biagio Borg, MD  gabapentin (NEURONTIN) 100 MG capsule TAKE 2 CAPSULES BY MOUTH 3 TIMES DAILY. 02/02/21   Biagio Borg, MD  glipiZIDE (GLUCOTROL XL) 10 MG 24 hr tablet TAKE 1 TABLET (10 MG TOTAL) BY MOUTH DAILY WITH BREAKFAST. 03/24/21   Biagio Borg, MD  Influenza Vac High-Dose Quad (FLUZONE HIGH-DOSE QUADRIVALENT IM)  09/29/19   [provider]  JANUVIA 100 MG tablet TAKE 1 TABLET BY MOUTH EVERY DAY 08/13/20   Biagio Borg, MD  Methoxy PEG-Epoetin Beta (MIRCERA IJ) Mircera 07/29/20 12/08/21  [provider]  multivitamin (RENA-VIT) TABS tablet TAKE 1 TABLET BY MOUTH EVERYDAY AT BEDTIME 09/22/20   Biagio Borg, MD  nebivolol (BYSTOLIC) 5 MG tablet Take 1 tablet (5 mg total) by mouth daily. 09/12/20  Lorretta Harp, MD  pantoprazole (PROTONIX) 40 MG tablet TAKE 1 TABLET BY MOUTH TWICE A DAY 03/24/21   Biagio Borg, MD  RESTASIS 0.05 % ophthalmic emulsion 1 drop 2 (two) times daily. 08/29/20   [provider]  rosuvastatin (CRESTOR) 40 MG tablet TAKE 1 TABLET BY MOUTH EVERY DAY 12/20/20   Biagio Borg, MD  [facility] 500 MG chewable tablet  03/04/20   [provider]    Allergies    Oxycodone  Review of Systems   Review of Systems  Constitutional:  Positive for appetite change and fatigue. Negative for chills and fever.  HENT:  Negative for ear pain and sore throat.   Eyes:  Negative for pain and visual disturbance.  Respiratory:  Negative for cough and shortness of breath.   Cardiovascular:  Negative for chest pain and palpitations.  Gastrointestinal:  Positive for diarrhea. Negative for abdominal pain, blood in stool, nausea and vomiting.  Genitourinary:  Negative for dysuria and hematuria.  Musculoskeletal:  Negative for arthralgias and back pain.  Skin:  Negative for color change and rash.  Neurological:  Negative for seizures and syncope.  Hematological:  Bruises/bleeds easily.  All other systems reviewed and are negative.  Physical Exam Updated Vital Signs BP (!) 142/56   Pulse 65   Temp 98.2 F (36.8 C)   Resp 12   SpO2 94%   Physical Exam Vitals and nursing note reviewed.  Constitutional:      General: She is not in acute distress.    Appearance: Normal appearance. She is well-developed. She is not toxic-appearing.  HENT:     Head: Normocephalic and atraumatic.   Eyes:     General: No scleral icterus.    Conjunctiva/sclera: Conjunctivae normal.  Cardiovascular:     Rate and Rhythm: Normal rate and regular rhythm.     Heart sounds: No murmur heard. Pulmonary:     Effort: Pulmonary effort is normal. No respiratory distress.     Breath sounds: Normal breath sounds.  Abdominal:     General: There is no distension.     Palpations: Abdomen is soft.     Tenderness: There is no abdominal tenderness. There is no guarding.  Musculoskeletal:     Cervical back: Normal range of motion and neck supple.     Comments: Bilateral below the knee amputee.  Left upper extremity with AV fistula good thrill.  Skin:    General: Skin is warm and dry.  Neurological:     General: No focal deficit present.     Mental Status: She is alert and oriented to person, place, and time.    ED Results / Procedures / Treatments   Labs (all labs ordered are listed, but only abnormal results are displayed) Labs Reviewed  CBC WITH DIFFERENTIAL/PLATELET - Abnormal; Notable for the following components:      Result Value   RBC 3.69 (*)    Hemoglobin 11.2 (*)    HCT 33.4 (*)    RDW 18.7 (*)    Platelets 95 (*)    All other components within normal limits  COMPREHENSIVE METABOLIC PANEL - Abnormal; Notable for the following components:   Potassium 3.3 (*)    Chloride 92 (*)    Creatinine, Ser 4.84 (*)    Total Protein 5.4 (*)    Albumin 2.4 (*)    AST 91 (*)    GFR, Estimated 9 (*)    All other components within normal limits  LIPASE, BLOOD  MAGNESIUM    EKG EKG Interpretation  Date/Time:  Wednesday June 25 2021 08:07:11 EDT Ventricular Rate:  67 PR Interval:  200 QRS Duration: 86 QT Interval:  414 QTC Calculation: 437 R Axis:   109 Text Interpretation: Normal sinus rhythm Rightward axis Nonspecific ST and T wave abnormality Abnormal ECG Confirmed by Fredia Sorrow [phone]) on 06/25/2021 11:22:15 AM  Radiology CT Abdomen Pelvis W Contrast  Result Date:  06/25/2021 CLINICAL DATA:  Diffuse abdominal pain infection suspected. EXAM: CT ABDOMEN AND PELVIS WITH CONTRAST TECHNIQUE: Multidetector CT imaging of the abdomen and pelvis was performed using the standard protocol following bolus administration of intravenous contrast. CONTRAST:  173m OMNIPAQUE IOHEXOL 300 MG/ML  SOLN COMPARISON:  July 09, 2011 CT and March 12, 2020 lumbar spine radiograph. FINDINGS: Lower chest: Small right greater than left pleural effusions with adjacent atelectasis. Hepatobiliary: No suspicious hepatic lesion. Cholelithiasis in a distended gallbladder. No biliary ductal dilation. Pancreas: No pancreatic ductal dilation or evidence of acute inflammation Spleen: Within normal limits. Adrenals/Urinary Tract: Bilateral adrenal glands are unremarkable. No hydronephrosis. Atrophic kidneys. Hypodense 1.2 cm left renal cyst. Subcentimeter hypodense bilateral renal lesions technically too small to accurately characterize but statistically likely represent cysts. Urinary bladder is decompressed. Stomach/Bowel: Stomach is unremarkable for degree of distension. No pathologic dilation of small or large bowel. The appendix and terminal ileum appear normal. Sigmoid colonic diverticulosis without findings of acute diverticulitis. Vascular/Lymphatic: Aortic and branch vessel atherosclerosis without abdominal aortic aneurysm. No pathologically enlarged abdominal or pelvic lymph nodes. Reproductive: Calcified uterine leiomyomas. No suspicious adnexal mass. Other: No significant abdominopelvic free fluid. Diffuse subcutaneous edema. Musculoskeletal: Vacuum disc artifact with endplate sclerosis and remodeling at L4-L5 similar to prior radiograph March 12, 2020. L5-S1 discogenic disease. IMPRESSION: 1. Cholelithiasis in a distended gallbladder, without other CT findings of cholecystitis. Consider further evaluation with right upper quadrant ultrasound if clinically indicated. 2. Small right greater than left  pleural effusions with adjacent atelectasis. 3. Sigmoid colonic diverticulosis without findings of acute diverticulitis. 4. Aortic Atherosclerosis (ICD10-I70.0). Electronically Signed   By: JDahlia BailiffM.D.   On: 06/25/2021 13:39   DG Chest Port 1 View  Result Date: 06/25/2021 CLINICAL DATA:  Weakness diarrhea dialysis patient EXAM: PORTABLE CHEST 1 VIEW COMPARISON:  03/12/2020. FINDINGS: Enlarged cardiac silhouette. Streaky left basilar opacities. No visible pneumothorax. Probable small left pleural effusion. Severe right shoulder degenerative change. IMPRESSION: 1. Streaky left basilar opacities, favor atelectasis. Infection or aspiration is not excluded. 2. Probable small left pleural effusion. 3. Cardiomegaly. Electronically Signed   By: FMargaretha SheffieldM.D.   On: 06/25/2021 13:08    Procedures Procedures   Medications Ordered in ED Medications  iohexol (OMNIPAQUE) 300 MG/ML solution 100 mL (100 mLs Intravenous Contrast Given 06/25/21 1250)    ED Course  I have reviewed the triage vital signs and the nursing notes.  Pertinent labs & imaging results that were available during my care of the patient were reviewed by me and considered in my medical decision making (see chart for details).    MDM Rules/Calculators/A&P                           Patient's labs hemoglobin 11.2 no significant anemia.  Platelets 95,000.  Normal white blood cell count electrolytes significant for potassium of 3.3.  Which is reassuring since she was last dialyzed on Monday liver function test without any significant abnormalities lipase normal.  Magnesium 1.8.  Will get chest x-ray just  to make sure no significant fluid accumulation and also will get CT abdomen and pelvis with contrast patient's been on dialysis long enough that IV contrast can be used.  Patient will consent to have the CT.  Chest x-ray raises some question about infectious opacity.  We will get CT chest to further clarify.  CT abdomen  pelvis shows cholelithiasis with distended gallbladder without other CT findings of cholecystitis.  Certainly patient's labs are not suggestive of cholecystitis.  Recommending further evaluation with right upper quadrant ultrasound.  There is also small right greater than left pleural effusion.  There is evidence of diverticulosis without diverticulitis.  We will get limited right upper quadrant ultrasound for evaluation of the gallbladder.  Patient does not appear that she needs dialysis today.  Disposition will be based on the ultrasound of the right upper quadrant and the CT chest. Final Clinical Impression(s) / ED Diagnoses Final diagnoses:  ESRD on dialysis ([place])  Diarrhea, unspecified type  Calculus of gallbladder without cholecystitis without obstruction    Rx / DC Orders ED Discharge Orders     None        Fredia Sorrow, MD 06/25/21 1419

## 2021-06-26 ENCOUNTER — Ambulatory Visit: Payer: Medicare PPO | Admitting: Internal Medicine

## 2021-06-26 ENCOUNTER — Inpatient Hospital Stay (HOSPITAL_COMMUNITY): Payer: Medicare PPO

## 2021-06-26 ENCOUNTER — Other Ambulatory Visit: Payer: Self-pay

## 2021-06-26 DIAGNOSIS — K8020 Calculus of gallbladder without cholecystitis without obstruction: Secondary | ICD-10-CM

## 2021-06-26 DIAGNOSIS — N186 End stage renal disease: Secondary | ICD-10-CM | POA: Diagnosis not present

## 2021-06-26 DIAGNOSIS — D638 Anemia in other chronic diseases classified elsewhere: Secondary | ICD-10-CM | POA: Diagnosis not present

## 2021-06-26 DIAGNOSIS — R197 Diarrhea, unspecified: Secondary | ICD-10-CM | POA: Diagnosis not present

## 2021-06-26 LAB — COMPREHENSIVE METABOLIC PANEL
ALT: 31 U/L (ref 0–44)
AST: 76 U/L — ABNORMAL HIGH (ref 15–41)
Albumin: 2.2 g/dL — ABNORMAL LOW (ref 3.5–5.0)
Alkaline Phosphatase: 111 U/L (ref 38–126)
Anion gap: 14 (ref 5–15)
BUN: 12 mg/dL (ref 8–23)
CO2: 25 mmol/L (ref 22–32)
Calcium: 9.6 mg/dL (ref 8.9–10.3)
Chloride: 93 mmol/L — ABNORMAL LOW (ref 98–111)
Creatinine, Ser: 5.72 mg/dL — ABNORMAL HIGH (ref 0.44–1.00)
GFR, Estimated: 8 mL/min — ABNORMAL LOW (ref >60)
Glucose, Bld: 50 mg/dL — ABNORMAL LOW (ref 70–99)
Potassium: 3.5 mmol/L (ref 3.5–5.1)
Sodium: 132 mmol/L — ABNORMAL LOW (ref 135–145)
Total Bilirubin: 0.9 mg/dL (ref 0.3–1.2)
Total Protein: 5.1 g/dL — ABNORMAL LOW (ref 6.5–8.1)

## 2021-06-26 LAB — CBG MONITORING, ED
Glucose-Capillary: 58 mg/dL — ABNORMAL LOW (ref 70–99)
Glucose-Capillary: 84 mg/dL (ref 70–99)

## 2021-06-26 LAB — CBC WITH DIFFERENTIAL/PLATELET
Abs Immature Granulocytes: 0.03 10*3/uL (ref 0.00–0.07)
Basophils Absolute: 0.1 10*3/uL (ref 0.0–0.1)
Basophils Relative: 1 %
Eosinophils Absolute: 0.1 10*3/uL (ref 0.0–0.5)
Eosinophils Relative: 2 %
HCT: 31.8 % — ABNORMAL LOW (ref 36.0–46.0)
Hemoglobin: 10.9 g/dL — ABNORMAL LOW (ref 12.0–15.0)
Immature Granulocytes: 1 %
Lymphocytes Relative: 38 %
Lymphs Abs: 2.1 10*3/uL (ref 0.7–4.0)
MCH: 30.9 pg (ref 26.0–34.0)
MCHC: 34.3 g/dL (ref 30.0–36.0)
MCV: 90.1 fL (ref 80.0–100.0)
Monocytes Absolute: 0.6 10*3/uL (ref 0.1–1.0)
Monocytes Relative: 11 %
Neutro Abs: 2.6 10*3/uL (ref 1.7–7.7)
Neutrophils Relative %: 47 %
Platelets: 71 10*3/uL — ABNORMAL LOW (ref 150–400)
RBC: 3.53 MIL/uL — ABNORMAL LOW (ref 3.87–5.11)
RDW: 19 % — ABNORMAL HIGH (ref 11.5–15.5)
WBC: 5.4 10*3/uL (ref 4.0–10.5)
nRBC: 0.4 % — ABNORMAL HIGH (ref 0.0–0.2)

## 2021-06-26 LAB — GLUCOSE, CAPILLARY
Glucose-Capillary: 114 mg/dL — ABNORMAL HIGH (ref 70–99)
Glucose-Capillary: 92 mg/dL (ref 70–99)
Glucose-Capillary: 94 mg/dL (ref 70–99)
Glucose-Capillary: 65 mg/dL — ABNORMAL LOW (ref 70–99)
Glucose-Capillary: 87 mg/dL (ref 70–99)

## 2021-06-26 LAB — TSH: TSH: 4.402 u[IU]/mL (ref 0.350–4.500)

## 2021-06-26 LAB — HIV ANTIBODY (ROUTINE TESTING W REFLEX): HIV Screen 4th Generation wRfx: NONREACTIVE

## 2021-06-26 LAB — HEPATITIS B SURFACE ANTIGEN: Hepatitis B Surface Ag: NONREACTIVE

## 2021-06-26 LAB — PHOSPHORUS: Phosphorus: 4 mg/dL (ref 2.5–4.6)

## 2021-06-26 LAB — HEPATITIS B SURFACE ANTIBODY,QUALITATIVE: Hep B S Ab: REACTIVE — AB

## 2021-06-26 LAB — MAGNESIUM: Magnesium: 1.9 mg/dL (ref 1.7–2.4)

## 2021-06-26 MED ORDER — HEPARIN SODIUM (PORCINE) 5000 UNIT/ML IJ SOLN
5000.0000 [IU] | Freq: Three times a day (TID) | INTRAMUSCULAR | Status: DC
  Administered 2021-06-26 – 2021-07-01 (×15): 5000 [IU] via SUBCUTANEOUS
  Filled 2021-06-26 (×15): qty 1

## 2021-06-26 MED ORDER — CHLORHEXIDINE GLUCONATE CLOTH 2 % EX PADS
6.0000 | MEDICATED_PAD | Freq: Every day | CUTANEOUS | Status: DC
  Administered 2021-06-26 – 2021-07-01 (×6): 6 via TOPICAL

## 2021-06-26 MED ORDER — TECHNETIUM TC 99M MEBROFENIN IV KIT
5.2000 | PACK | Freq: Once | INTRAVENOUS | Status: AC | PRN
  Administered 2021-06-26: 5.2 via INTRAVENOUS

## 2021-06-26 MED ORDER — CINACALCET HCL 30 MG PO TABS
30.0000 mg | ORAL_TABLET | ORAL | Status: DC
  Administered 2021-06-27 – 2021-06-30 (×2): 30 mg via ORAL
  Filled 2021-06-26 (×2): qty 1

## 2021-06-26 MED ORDER — AMLODIPINE BESYLATE 10 MG PO TABS
10.0000 mg | ORAL_TABLET | Freq: Every day | ORAL | Status: DC
  Administered 2021-06-27 – 2021-07-01 (×5): 10 mg via ORAL
  Filled 2021-06-26 (×5): qty 1

## 2021-06-26 NOTE — ED Notes (Signed)
ED tech came to this RN to report pt's BG. Instructed to give juice/graham crackers. Will recheck in an hour. Will notify attending.

## 2021-06-26 NOTE — Evaluation (Addendum)
Occupational Therapy Evaluation Patient Details Name: Carolyn Valenzuela MRN: [id number] DOB: 11-28-1950 Today's Date: 06/26/2021   History of Present Illness Pt adm 10/26 with abdominal pain and diarrhea. CT showed cholelithiasis. PMH - ESRD on HD, Bil BKA, PAD, DM, HTN, CAD.   Clinical Impression   PTA, pt lives alone in ramped entrance apartment and reports Modified Independent with ADLs, simple household IADLs and mobility. Pt typically uses RW and B prosthetic LE for mobility but has been using wheelchair more recently due to progressive weakness. Pt endorses 2 falls at home. Pt presents now with deficits noted below requiring Min A for bed mobility and min guard for short in-room mobility via RW and B prosthetic LE. Pt requires Setup for UB ADLs and up to Mod A for LB ADLs. Family present during much of session this AM. Due to increased risk for falls with mobility, recommend pt to DC home wheelchair level while working with [facility] services on safe mobility. Recommend HHOT follow-up, [place] aide assist and increased family support for IADLs and showering tasks. If increased support not available at DC, may need to consider SNF rehab. Will continue to follow acutely and update DC recs as appropriate.    Recommendations for follow up therapy are one component of a multi-disciplinary discharge planning process, led by the attending physician.  Recommendations may be updated based on patient status, additional functional criteria and insurance authorization.   Follow Up Recommendations  Home health OT    Assistance Recommended at Discharge Frequent or constant Supervision/Assistance (increased support for IADLs, showering tasks initially)  Functional Status Assessment  Patient has had a recent decline in their functional status and demonstrates the ability to make significant improvements in function in a reasonable and predictable amount of time.  Equipment Recommendations  None recommended by OT     Recommendations for Other Services Other (comment) ([place] aide)     Precautions / Restrictions Precautions Precautions: Fall;Other (comment) Precaution Comments: B BKA with prosthetics in room Restrictions Weight Bearing Restrictions: No      Mobility Bed Mobility Overal bed mobility: Needs Assistance Bed Mobility: Supine to Sit;Sit to Supine     Supine to sit: Min assist;HOB elevated Sit to supine: Min assist   General bed mobility comments: Min A to lift trunk via handheld assist (due to vision had some difficulty locating bed rails), MIn A to get LEs/hips back in bed    Transfers Overall transfer level: Needs assistance Equipment used: Rolling walker (2 wheels) Transfers: Sit to/from Stand Sit to Stand: Min guard           General transfer comment: min guard for safety with increased time to power up with RW, cues for hand placement needed for transitional movements      Balance Overall balance assessment: Needs assistance;History of Falls Sitting-balance support: Feet supported;Bilateral upper extremity supported Sitting balance-Leahy Scale: Fair Sitting balance - Comments: fair static sitting, LOB with dynamic tasks   Standing balance support: Bilateral upper extremity supported;Reliant on assistive device for balance;During functional activity Standing balance-Leahy Scale: Poor Standing balance comment: reliant on RW in standing                           ADL either performed or assessed with clinical judgement   ADL Overall ADL's : Needs assistance/impaired Eating/Feeding: Independent;Sitting Eating/Feeding Details (indicate cue type and reason): but is NPO at this time Grooming: Set up;Sitting   Upper Body Bathing: Set up;Sitting  Lower Body Bathing: Minimal assistance;Sitting/lateral leans;Sit to/from stand   Upper Body Dressing : Set up;Sitting   Lower Body Dressing: Moderate assistance;Sit to/from stand;Sitting/lateral leans Lower Body  Dressing Details (indicate cue type and reason): Will require assist in standing to ensure balance when donning over waist. Pt required increased assist for donning prosthetic LEs today (family assisting but OT encouraged pt to attempt on her own). Pt missing shrinker socks, so used grip socks temoporarily as shrinkers     Toileting- Clothing Manipulation and Hygiene: Moderate assistance;Sit to/from stand;Sitting/lateral lean       Functional mobility during ADLs: Min guard;Rolling walker (2 wheels);Cueing for sequencing;Cueing for safety (with B prosthetic LEs) General ADL Comments: Pt with weakness impairing ability to safely ambulate and complete ADLs in standing without external assist.     Vision Baseline Vision/History: 1 Wears glasses;3 Glaucoma;4 Cataracts;6 Macular Degeneration Ability to See in Adequate Light: 2 Moderately impaired Patient Visual Report: No change from baseline Vision Assessment?: Vision impaired- to be further tested in functional context Additional Comments: reports hx of various eye conditions that make it difficult to see. Pt was pressing red button on phone for assistance rather than on call bell due to vision - reoriented to appropriate button to call for assist     Perception     Praxis      Pertinent Vitals/Pain Pain Assessment: No/denies pain     Hand Dominance Right   Extremity/Trunk Assessment Upper Extremity Assessment Upper Extremity Assessment: Generalized weakness   Lower Extremity Assessment Lower Extremity Assessment: Defer to PT evaluation   Cervical / Trunk Assessment Cervical / Trunk Assessment: Normal   Communication Communication Communication: No difficulties   Cognition Arousal/Alertness: Awake/alert Behavior During Therapy: WFL for tasks assessed/performed Overall Cognitive Status: Impaired/Different from baseline Area of Impairment: Attention;Following commands;Awareness                   Current Attention  Level: Sustained   Following Commands: Follows one step commands with increased time   Awareness: Emergent   General Comments: easily distracted (especially by family members in room). benefits from cues to focus and attend to tasks, as well as safety cues     General Comments       Exercises     Shoulder Instructions      Home Living Family/patient expects to be discharged to:: Private residence Living Arrangements: Alone Available Help at Discharge: Family;Available PRN/intermittently Type of Home: Apartment Home Access: Ramped entrance     Home Layout: One level     Bathroom Shower/Tub: Teacher, early years/pre: Standard     Home Equipment: Wheelchair - Publishing copy (2 wheels);Tub bench   Additional Comments: [name] reports pt can DC to her apartment if needed though she works.      Prior Functioning/Environment Prior Level of Function : Needs assist;History of Falls (last six months)       Physical Assist : ADLs (physical)   ADLs (physical): IADLs Mobility Comments: Reports typically using B prosthetic LEs and RW to ambulate but has been feeling weaker and using wheelchair mainly. family reports 2 recent falls ADLs Comments: Pt reports typically able to bathe (use of tub bench for shower transfers), dress and toilet self. Pt reports able to warm up simple meals, etc but family also brings in meals. family assists with transportation to/from dialysis        OT Problem List: Decreased strength;Decreased activity tolerance;Impaired balance (sitting and/or standing);Decreased safety awareness  OT Treatment/Interventions: Self-care/ADL training;Therapeutic exercise;Energy conservation;DME and/or AE instruction;Therapeutic activities;Patient/family education;Balance training    OT Goals(Current goals can be found in the care plan section) Acute Rehab OT Goals Patient Stated Goal: go home, increase strength OT Goal Formulation: With  patient Time For Goal Achievement: 07/10/21 Potential to Achieve Goals: Good ADL Goals Pt Will Perform Lower Body Bathing: with set-up;sitting/lateral leans;sit to/from stand Pt Will Perform Lower Body Dressing: with set-up;sitting/lateral leans;sit to/from stand Pt Will Transfer to Toilet: with supervision;ambulating Pt Will Perform Tub/Shower Transfer: with supervision;ambulating;tub bench;rolling walker Pt/caregiver will Perform Home Exercise Program: Increased strength;Both right and left upper extremity;With theraband;Independently;With written HEP provided  OT Frequency: Min 2X/week   Barriers to D/C:            Co-evaluation              AM-PAC OT "6 Clicks" Daily Activity     Outcome Measure Help from another person eating meals?: None Help from another person taking care of personal grooming?: A Little Help from another person toileting, which includes using toliet, bedpan, or urinal?: A Lot Help from another person bathing (including washing, rinsing, drying)?: A Little Help from another person to put on and taking off regular upper body clothing?: A Little Help from another person to put on and taking off regular lower body clothing?: A Lot 6 Click Score: 17   End of Session Equipment Utilized During Treatment: Rolling walker (2 wheels);Gait belt;Other (comment) (B prosthetic LEs) Nurse Communication: Mobility status  Activity Tolerance: Patient tolerated treatment well Patient left: in bed;with call bell/phone within reach;with bed alarm set  OT Visit Diagnosis: Unsteadiness on feet (R26.81);Other abnormalities of gait and mobility (R26.89);Muscle weakness (generalized) (M62.81)                Time: 4782-9562 OT Time Calculation (min): 42 min Charges:  OT General Charges $OT Visit: 1 Visit OT Evaluation $OT Eval Moderate Complexity: 1 Mod OT Treatments $Self Care/Home Management : 8-22 mins $Therapeutic Activity: 8-22 mins  Malachy Chamber, OTR/L Acute Rehab  Services Office: [phone]   Layla Maw 06/26/2021, 11:38 AM

## 2021-06-26 NOTE — Plan of Care (Signed)
  Problem: Education: Goal: Knowledge of General Education information will improve Description Including pain rating scale, medication(s)/side effects and non-pharmacologic comfort measures Outcome: Progressing   

## 2021-06-26 NOTE — Progress Notes (Signed)
PT Cancellation Note  Patient Details Name: AILIE GAGE MRN: [id number] DOB: [birth year]   Cancelled Treatment:    Reason Eval/Treat Not Completed: Patient at procedure or test/unavailable. Pt at HD.    Shary Decamp [facility] 06/26/2021, 2:49 PM Tennie Grussing [place] Pager [phone] Office [phone]

## 2021-06-26 NOTE — Consult Note (Addendum)
[facility] Renal Consultation Note    Indication for Consultation:  Management of ESRD/hemodialysis, anemia, hypertension/volume, and secondary hyperparathyroidism.  HPI: Carolyn Valenzuela is a 70 y.o. female with past medical history including ESRD on dialysis Monday Wednesday Friday, type 2 diabetes, PAD status post bilateral BKA's, hypertension, and CAD she presented to the ED yesterday with abdominal pain and diarrhea for 4 weeks.  On initial exam vital signs were stable, labs notable for K3.2, BUN 11, creatinine 5.34, AST 86, ALT 33, hemoglobin 11.2.  CT of the abdomen showed cholelithiasis and distended gallbladder.  CT chest with small bilateral pleural effusions.  Essentially distended gallbladder with wall thickening and multiple gallstones and sludge.  HIDA scan is pending.  Nephrology has been consulted for management of ESRD.  Patient reports her last dialysis session was Monday.  She missed dialysis yesterday due to her ED visit.  She denies any shortness of breath, chest pain, or palpitations.  She had mild dizziness with standing this a.m.  No current abdominal pain, nausea, vomiting, diarrhea.  Denies any edema.  She reports the blood flow in her fistula has been decreased but it has been working well with dialysis as far she can tell.  No new concerns at this time.  Past Medical History:  Diagnosis Date   Allergic rhinitis, cause unspecified 12/08/2010   ANEMIA-IRON DEFICIENCY 01/25/2008   Aortic regurgitation    CAD (coronary artery disease)    a. 07/2016 NSTEMI/PCI: LM nl, LAD 70ost, 34md, 80d, D1 40ost, LCX 99ost/p (2.5x16 Synergy DES - 2.75), 40/626mOm2 100 CTO, RCA  30p/m. Pt eval by CT surgery, not felt to be suitable candidate 2/2 comorbidities.   CERVICAL RADICULOPATHY, LEFT 5/0/27/7412 Chronic systolic CHF (congestive heart failure) ([facility]   a. 07/2016 Echo: EF 40-45%, diff HK, sev AI, mild MVP w/ mod to sev MR, PASP 5122m.   Critical lower limb ischemia  ([facility]  DIABETES MELLITUS, UNCONTROLLED 05/21/2009   Esophagitis    [facility]  Foot ulcer ([place]CC[place]  right lateral malleolus   HCAP (healthcare-associated pneumonia) 06/29/2016   HYPERLIPIDEMIA 03/30/2007   HYPERTENSION 01/25/2008   Ischemic cardiomyopathy    a. 07/2016 Echo: EF 40-45%.   Mitral regurgitation    a. 07/2016 Echo: Mild MVP w/ mod to sev MR.   PVD (peripheral vascular disease) ([facility]CC[place]  a. s/p bilat BKA   SECONDARY HYPERPARATHYROIDISM 05/21/2009   Severe aortic regurgitation    a. 07/2016 Echo: Severe AI.   Past Surgical History:  Procedure Laterality Date   AMPUTATION Right 03/15/2014   Procedure: RIGHT  LEG  BELOW KNEE AMPUTATION ;  Surgeon: JohWylene SimmerD;  [facility];  Laterality: Right;   AMPUTATION Left 11/29/2015   Procedure: AMPUTATION BELOW KNEE;  Surgeon: MarNewt MinionD;  [facility] [place];  Laterality: Left;   AV FISTULA PLACEMENT Left    AV FISTULA PLACEMENT Left 09/06/2014   Procedure: ARTERIOVENOUS (AV) FISTULA CREATION-Left Brachiocephalic;  Surgeon: BriConrad BurlingtonD;  [facility] [place] [facility];  Laterality: Left;   CARDIAC CATHETERIZATION N/A 06/30/2016   Procedure: Left Heart Cath and Coronary Angiography;  Surgeon: ChrBurnell BlanksD;  [facility] [place] [facility];  Service: Cardiovascular;  Laterality: N/A;   CARDIAC CATHETERIZATION N/A 07/02/2016   Procedure: Coronary Stent Intervention;  Surgeon: ChrNelva BushD;  [facility] [place] [facility];  Service: Cardiovascular;  Laterality: N/A;   CATARACT EXTRACTION     COLONOSCOPY     CORONARY ANGIOPLASTY WITH STENT PLACEMENT  07/02/2016   ESOPHAGOGASTRODUODENOSCOPY N/A 03/20/2016   Procedure: ESOPHAGOGASTRODUODENOSCOPY (EGD);  Surgeon: Milus Banister, MD;  Location: [place];  Service: Endoscopy;  Laterality: N/A;   EYE SURGERY Bilateral    cataracts removed, left eye still has some oil in it.   INSERTION OF DIALYSIS  CATHETER N/A 09/03/2014   Procedure: INSERTION OF DIALYSIS CATHETER RIGHT INTERNAL JUGULAR VEIN;  Surgeon: Conrad Lacoochee, MD;  Location: [place];  Service: Vascular;  Laterality: N/A;   PERIPHERAL VASCULAR CATHETERIZATION N/A 08/13/2015   Procedure: Abdominal Aortogram;  Surgeon: Elam Dutch, MD;  Location: [place] [facility];  Service: Cardiovascular;  Laterality: N/A;   Family History  Problem Relation Age of Onset   Cancer Mother        Pancreatic   Social History:  reports that she has never smoked. She has never used smokeless tobacco. She reports that she does not drink alcohol and does not use drugs.  ROS: As per HPI otherwise negative.  Physical Exam: Vitals:   06/26/21 0200 06/26/21 0235 06/26/21 0240 06/26/21 0826  BP: (!) 136/55  (!) 121/54 (!) 141/54  Pulse: 66  63 65  Resp: 12  18   Temp: 97.6 F (36.4 C)  97.7 F (36.5 C) 98.2 F (36.8 C)  TempSrc: Oral  Oral Oral  SpO2: 92%  92% 96%  Weight:  53.3 kg    Height:  5' (1.524 m)       General: Well developed, elderly female, alert and in NAD HEENT: NCAT, sclera anicteric, mucus membranes moist Cardiac: RRR, no murmur, rubs or gallops. No JVD Pul,: Lungs CTA bilaterally without wheezing, rhonchi or rales. Respirations unlabored on RA GI: Abdomen soft, non-tender, non-distended, +BS, no obvious masses Skin: No visible rashes/lesions Extremities: Bilateral BKA, no stump or hip edema Dialysis access: LUE AVF aneurysmal with + thrill and bruit  Allergies  Allergen Reactions   Oxycodone Itching   Prior to Admission medications   Medication Sig Start Date End Date Taking? Authorizing Provider  acetaminophen (TYLENOL) 500 MG tablet Take 500 mg by mouth every 6 (six) hours as needed for mild pain.   Yes [provider]  albuterol (VENTOLIN HFA) 108 (90 Base) MCG/ACT inhaler Inhale 2 puffs into the lungs every 6 (six) hours as needed for wheezing or shortness of breath. 02/13/21  Yes Biagio Borg, MD  ALPHAGAN  P 0.1 % SOLN Place 1 drop into both eyes 3 (three) times daily. 11/18/20  Yes [provider]  amLODipine (NORVASC) 10 MG tablet TAKE 1 TABLET BY MOUTH EVERYDAY AT BEDTIME Patient taking differently: Take 10 mg by mouth daily. 08/13/20  Yes Biagio Borg, MD  aspirin 81 MG EC tablet Take 81 mg by mouth daily.   Yes [provider]  clopidogrel (PLAVIX) 75 MG tablet Take 1 tablet (75 mg total) by mouth daily. 01/14/21  Yes Meng, Isaac Laud, PA  gabapentin (NEURONTIN) 100 MG capsule TAKE 2 CAPSULES BY MOUTH 3 TIMES DAILY. Patient taking differently: Take 100 mg by mouth 3 (three) times daily. 02/02/21  Yes Biagio Borg, MD  glipiZIDE (GLUCOTROL XL) 10 MG 24 hr tablet TAKE 1 TABLET (10 MG TOTAL) BY MOUTH DAILY WITH BREAKFAST. 03/24/21  Yes Biagio Borg, MD  multivitamin (RENA-VIT) TABS tablet TAKE 1 TABLET BY MOUTH EVERYDAY AT BEDTIME Patient taking differently: Take 1 tablet by mouth  daily. 09/22/20  Yes Biagio Borg, MD  nebivolol (BYSTOLIC) 5 MG tablet Take 1 tablet (5 mg total) by mouth daily. 09/12/20  Yes Lorretta Harp, MD  pantoprazole (PROTONIX) 40 MG tablet TAKE 1 TABLET BY MOUTH TWICE A DAY Patient taking differently: Take 40 mg by mouth daily. 03/24/21  Yes Biagio Borg, MD  RESTASIS 0.05 % ophthalmic emulsion Place 1 drop into both eyes 2 (two) times daily. 08/29/20  Yes [provider]  rosuvastatin (CRESTOR) 40 MG tablet TAKE 1 TABLET BY MOUTH EVERY DAY Patient taking differently: Take 40 mg by mouth daily. 12/20/20  Yes Biagio Borg, MD  VELPHORO 500 MG chewable tablet Chew 500 mg by mouth 2 (two) times daily. 03/04/20  Yes [provider]  ACCU-CHEK GUIDE test strip USE TO CHECK BLOOD SUGARS 2 TIMES A DAY. DX: E11.9 03/05/21   Biagio Borg, MD  Accu-Chek Softclix Lancets lancets Use to check blood sugars 2x a day. DX: E11.9 02/09/20   Biagio Borg, MD  Blood Glucose Monitoring Suppl (ACCU-CHEK GUIDE ME) w/Device KIT USE DEVICE TO CHECK BLOOD SUGARS 2X A DAY.  DX: E11.9 03/11/20   Biagio Borg, MD  doxycycline (VIBRA-TABS) 100 MG tablet Take 1 tablet (100 mg total) by mouth 2 (two) times daily. Patient not taking: Reported on 06/25/2021 02/13/21   Biagio Borg, MD  Influenza Vac High-Dose Quad (FLUZONE HIGH-DOSE QUADRIVALENT IM)  09/29/19   [provider]  JANUVIA 100 MG tablet TAKE 1 TABLET BY MOUTH EVERY DAY Patient not taking: No sig reported 08/13/20   Biagio Borg, MD  Methoxy PEG-Epoetin Beta (MIRCERA IJ) Mircera 07/29/20 12/08/21  [provider]   Current Facility-Administered Medications  Medication Dose Route Frequency Provider Last Rate Last Admin   0.9 %  sodium chloride infusion  250 mL Intravenous PRN Toy Baker, MD       acetaminophen (TYLENOL) tablet 650 mg  650 mg Oral Q6H PRN Toy Baker, MD       Or   acetaminophen (TYLENOL) suppository 650 mg  650 mg Rectal Q6H PRN Doutova, Anastassia, MD       albuterol (VENTOLIN HFA) 108 (90 Base) MCG/ACT inhaler 2 puff  2 puff Inhalation Q6H PRN Toy Baker, MD       [START ON 06/27/2021] amLODipine (NORVASC) tablet 10 mg  10 mg Oral Daily Doutova, Anastassia, MD       brimonidine (ALPHAGAN) 0.15 % ophthalmic solution 1 drop  1 drop Both Eyes TID Toy Baker, MD   1 drop at 06/26/21 0151   cycloSPORINE (RESTASIS) 0.05 % ophthalmic emulsion 1 drop  1 drop Both Eyes BID Toy Baker, MD   1 drop at 06/26/21 0112   fentaNYL (SUBLIMAZE) injection 12.5-50 mcg  12.5-50 mcg Intravenous Q2H PRN Toy Baker, MD       heparin injection 5,000 Units  5,000 Units Subcutaneous Q8H Arrien, Jimmy Picket, MD       HYDROcodone-acetaminophen (NORCO/VICODIN) 5-325 MG per tablet 1-2 tablet  1-2 tablet Oral Q4H PRN Doutova, Anastassia, MD       insulin aspart (novoLOG) injection 0-6 Units  0-6 Units Subcutaneous Q4H Doutova, Anastassia, MD       nebivolol (BYSTOLIC) tablet 5 mg  5 mg Oral Daily Doutova, Anastassia, MD       ondansetron (ZOFRAN)  tablet 4 mg  4 mg Oral Q6H PRN Doutova, Anastassia, MD       Or   ondansetron (ZOFRAN) injection 4 mg  4 mg Intravenous Q6H PRN Doutova, Anastassia, MD       pantoprazole (PROTONIX) EC tablet 40 mg  40 mg Oral Daily Doutova, Anastassia, MD       rosuvastatin (CRESTOR) tablet 40 mg  40 mg Oral Daily Doutova, Anastassia, MD       sodium chloride flush (NS) 0.9 % injection 3 mL  3 mL Intravenous Q12H Doutova, Anastassia, MD   3 mL at 06/26/21 0117   sodium chloride flush (NS) 0.9 % injection 3 mL  3 mL Intravenous PRN Toy Baker, MD       sucroferric oxyhydroxide (VELPHORO) chewable tablet 500 mg  500 mg Oral BID Toy Baker, MD   500 mg at 06/26/21 0115   Labs: Basic Metabolic Panel: Recent Labs  Lab 06/25/21 0835 06/25/21 1659 06/26/21 0335  NA 136 134* 132*  K 3.3* 3.2* 3.5  CL 92* 93* 93*  CO2 '30 28 25  ' GLUCOSE 77 77 50*  BUN '8 11 12  ' CREATININE 4.84* 5.34* 5.72*  CALCIUM 9.9 9.6 9.6  PHOS  --   --  4.0   Liver Function Tests: Recent Labs  Lab 06/25/21 0835 06/25/21 1659 06/26/21 0335  AST 91* 86* 76*  ALT 32 33 31  ALKPHOS 124 126 111  BILITOT 1.2 1.1 0.9  PROT 5.4* 5.2* 5.1*  ALBUMIN 2.4* 2.2* 2.2*   Recent Labs  Lab 06/25/21 0835 06/25/21 1659  LIPASE 31 29   No results for input(s): AMMONIA in the last 168 hours. CBC: Recent Labs  Lab 06/25/21 0835 06/26/21 0335  WBC 5.3 5.4  NEUTROABS 3.0 2.6  HGB 11.2* 10.9*  HCT 33.4* 31.8*  MCV 90.5 90.1  PLT 95* 71*   Cardiac Enzymes: No results for input(s): CKTOTAL, CKMB, CKMBINDEX, TROPONINI in the last 168 hours. CBG: Recent Labs  Lab 06/25/21 1959 06/26/21 0023 06/26/21 0124 06/26/21 0359 06/26/21 0824  GLUCAP 73 58* 84 114* 92   Iron Studies: No results for input(s): IRON, TIBC, TRANSFERRIN, FERRITIN in the last 72 hours. Studies/Results: CT Chest Wo Contrast  Result Date: 06/25/2021 CLINICAL DATA:  Pneumonia.  Weakness. EXAM: CT CHEST WITHOUT CONTRAST TECHNIQUE: Multidetector  CT imaging of the chest was performed following the standard protocol without IV contrast. COMPARISON:  CT abdomen and pelvis 06/25/2021. Chest x-ray 06/25/2021. FINDINGS: Cardiovascular: The heart is mildly enlarged. Aorta is normal in size. There are atherosclerotic calcifications of the aorta and coronary arteries. There is no pericardial effusion. Left axillary vascular stent is partially visualized. Mediastinum/Nodes: There are nonenlarged paratracheal and prevascular lymph nodes. There is a mildly enlarged subcarinal lymph node measuring 11 mm short axis. Visualized thyroid gland and esophagus are within normal limits. Lungs/Pleura: There is a small amount of atelectasis in the bilateral lower lobes and lingula. There is a calcified granuloma in the right lower lobe. There is a small right pleural effusion and trace left pleural effusion. There is no pneumothorax. Trachea and central airways are patent. Upper Abdomen: Gallstones are present. There are hypodensities in the kidneys which are too small to characterize, likely cysts. Musculoskeletal: There is diffuse body wall edema. Calcifications are seen in the bilateral breasts. There is skin thickening of the anterior and posterior chest wall. There are severe/end-stage degenerative changes of the right shoulder. No acute fractures are seen. IMPRESSION: 1. Small bilateral pleural effusions 2. Bilateral lower lobe and lingular atelectasis. 3. Cardiomegaly. 4. Mildly enlarged subcarinal lymph node. 5. Diffuse body wall edema. 6. Posterior and anterior chest wall skin thickening.  Correlate for infection. 7. Cholelithiasis. Aortic Atherosclerosis (ICD10-I70.0). Electronically Signed   By: Ronney Asters M.D.   On: 06/25/2021 15:14   CT Abdomen Pelvis W Contrast  Result Date: 06/25/2021 CLINICAL DATA:  Diffuse abdominal pain infection suspected. EXAM: CT ABDOMEN AND PELVIS WITH CONTRAST TECHNIQUE: Multidetector CT imaging of the abdomen and pelvis was  performed using the standard protocol following bolus administration of intravenous contrast. CONTRAST:  176m OMNIPAQUE IOHEXOL 300 MG/ML  SOLN COMPARISON:  July 09, 2011 CT and March 12, 2020 lumbar spine radiograph. FINDINGS: Lower chest: Small right greater than left pleural effusions with adjacent atelectasis. Hepatobiliary: No suspicious hepatic lesion. Cholelithiasis in a distended gallbladder. No biliary ductal dilation. Pancreas: No pancreatic ductal dilation or evidence of acute inflammation Spleen: Within normal limits. Adrenals/Urinary Tract: Bilateral adrenal glands are unremarkable. No hydronephrosis. Atrophic kidneys. Hypodense 1.2 cm left renal cyst. Subcentimeter hypodense bilateral renal lesions technically too small to accurately characterize but statistically likely represent cysts. Urinary bladder is decompressed. Stomach/Bowel: Stomach is unremarkable for degree of distension. No pathologic dilation of small or large bowel. The appendix and terminal ileum appear normal. Sigmoid colonic diverticulosis without findings of acute diverticulitis. Vascular/Lymphatic: Aortic and branch vessel atherosclerosis without abdominal aortic aneurysm. No pathologically enlarged abdominal or pelvic lymph nodes. Reproductive: Calcified uterine leiomyomas. No suspicious adnexal mass. Other: No significant abdominopelvic free fluid. Diffuse subcutaneous edema. Musculoskeletal: Vacuum disc artifact with endplate sclerosis and remodeling at L4-L5 similar to prior radiograph March 12, 2020. L5-S1 discogenic disease. IMPRESSION: 1. Cholelithiasis in a distended gallbladder, without other CT findings of cholecystitis. Consider further evaluation with right upper quadrant ultrasound if clinically indicated. 2. Small right greater than left pleural effusions with adjacent atelectasis. 3. Sigmoid colonic diverticulosis without findings of acute diverticulitis. 4. Aortic Atherosclerosis (ICD10-I70.0). Electronically  Signed   By: JDahlia BailiffM.D.   On: 06/25/2021 13:39   UKoreaAbdomen Limited  Result Date: 06/25/2021 CLINICAL DATA:  CT shows gallstones and distended gallbladder EXAM: ULTRASOUND ABDOMEN LIMITED RIGHT UPPER QUADRANT COMPARISON:  CT June 25, 2021. FINDINGS: Gallbladder: Multiple gallstones, measuring up to 1.6 cm. Layering sludge within the gallbladder. No sonographic Murphy sign per technologist. Gallbladder wall thickening, measuring 3.9 mm. Common bile duct: Diameter: 6.7 mm, dilated. Liver:Heterogeneous echotexture, widened fissures and lobular cortex. No focal lesion identified. Portal vein is patent on color Doppler imaging with normal direction of blood flow towards the liver. Other: Echogenic partially imaged right kidney IMPRESSION: 1. Distended gallbladder with gallbladder wall thickening and multiple large gallstones and sludge. No sonographic Murphy sign per the technologist. Findings are indeterminate for acute cholecystitis. HIDA scan could further evaluate if clinically uncertain. 2. Dilated common bile duct, measuring up to 6.7 mm. Recommend correlation with liver functions to exclude obstruction. MRCP could further characterize if clinically indicated. 3. Heterogeneous echotexture of the liver with widened fissures and lobular cortex of the liver. Question liver disease / developing cirrhosis. Recommend clinical correlation. 4. Echogenic partially imaged right kidney, possibly related to chronic kidney disease. Electronically Signed   By: FMargaretha SheffieldM.D.   On: 06/25/2021 16:52   DG Chest Port 1 View  Result Date: 06/25/2021 CLINICAL DATA:  Weakness diarrhea dialysis patient EXAM: PORTABLE CHEST 1 VIEW COMPARISON:  03/12/2020. FINDINGS: Enlarged cardiac silhouette. Streaky left basilar opacities. No visible pneumothorax. Probable small left pleural effusion. Severe right shoulder degenerative change. IMPRESSION: 1. Streaky left basilar opacities, favor atelectasis. Infection or  aspiration is not excluded. 2. Probable small left pleural effusion. 3. Cardiomegaly. Electronically Signed  By: Margaretha Sheffield M.D.   On: 06/25/2021 13:08    Dialysis Orders:  Center: [facility]  on MWF. 160NRe, 4 hours, BFR 400, DFR 500, EDW 54.3kg, 3K, 2Ca, AVF 15g, heparin 2500 unit bolus Mircera 81mg IV q 2 weeks- last dose 06/23/21 Hectorol 567m IV q HD Sensipar 3075m times per week Velphoro 500m79mtabs PO TID with meals  Assessment/Plan:  Cholelithiasis/diarrhea: Pending HIDA scan and c diff stool panel, supportive therapy per primary team.  ESRD:  MWF, missed last HD due to ED visit. No volume overload on exam, K+ controlled. Will plan short HD today, then resume MWF schedule tomorrow. Last AF down from baseline slightly, planned for fistulogram outpatient but can try to arrange for fistulogram here if any issues with HD.  Hypertension/volume: BP overall well controlled. She did have some dizziness with standing this AM. Does not appear volume overloaded and has minimal PO intake lately, UFG 1L with HD as tolerated.   Anemia: Hemoglobin at goal, ESA recently dosed. No bleeding reported. Continue to trend Hgb.   Metabolic bone disease: Corrected calcium is high, will hold hectorol for now. Continue sensipar. Phos controlled, continue binders when tolerating PO  Nutrition:  Currently NPO  SamaAnice Paganini-C 06/26/2021, 10:44 AM  [facility] Pager: ([phone] seen and examined this patient and agree with plan and assessment in the above note with renal recommendations/intervention highlighted.  Pt currently without abdominal pain but does have tenderness to palpation.  Will plan for HD today since she missed yesterday and again on Saturday.  Will get back on schedule Monday.  Awaiting HIDA scan results.  JoseBroadus Johnoladonato,MD 06/26/2021 2:14 PM

## 2021-06-26 NOTE — Progress Notes (Signed)
PROGRESS NOTE    BO ROGUE  LPF:[id number] DOB: 04-20-1951 DOA: 06/25/2021 PCP: Biagio Borg, MD    Brief Narrative:  Mrs. Chacko was admitted to the hospital with the working diagnosis of choledocholithiasis.   70 yo female with the past medical history of ESRD on HD, T2DM, PAD sp bilateral BKA, HTN, dyslipidemia, and coronary artery disease. Presented with abdominal pain, suprapubic discomfort and diarrhea for the last 4 weeks.  On her initial physical examination blood pressure 131/52, heart rate 68, respiratory rate 15, temperature 98.2, oxygen saturation 100% her lungs are clear to auscultation bilaterally, heart S1-S2, present, rhythmic, abdomen soft, positive tenderness to superficial palpation at the suprapubic region, negative Murphy sign, bilateral BKA.  Sodium 134, potassium 3.2, chloride 93, bicarb 28, glucose 77, BUN 11, creatinine 5.34, lipase 29, AST 86, ALT 33, total bilirubin is 1.1, white cell count 5.3, hemoglobin 11.2, hematocrit 33.4, platelets 95 SARS COVID-19 negative.  CT of the abdomen with cholelithiasis and distended gallbladder without other CT findings of cholecystitis.  No biliary duct dilatation.  No pancreatic duct dilatation. CT chest with small bilateral pleural effusions. Chest radiograph with cardiomegaly, no infiltrates, small left pleural effusion.  EKG 67 bpm, rightward axis, normal intervals, sinus rhythm, no significant ST segment or T wave changes.  Further work-up with abdominal ultrasonography showing distended gallbladder with gallbladder wall thickening and multiple large gallstones and sludge.  No sonographic Murphy sign.  Dilated common bile duct measuring 6.7 cm.  HIDA scan has been ordered.  Assessment & Plan:   Principal Problem:   Cholelithiases Active Problems:   Hyperlipidemia   Essential hypertension   Anemia of chronic disease   S/P bilateral BKA (below knee amputation) ([place])   ESRD on dialysis ([place])   Type 2 diabetes  mellitus with diabetic peripheral angiopathy and gangrene, without long-term current use of insulin (HCC)   Diarrhea, unspecified   Hypokalemia   Essential (primary) hypertension   Secondary hyperparathyroidism of renal origin ([place])   Cholelithiasis, to rule out cholecystitis. Patient continue to have suprapubic pain and diarrhea, no nausea or vomiting. Pending HIDA scan.   Plan continue supportive medical therapy, as needed analgesics and antiemetics. Pending HIDA scan to confirm cholecystitis, hold on antibiotic therapy for now and will consult surgery for further recommendations.  Follow upon stool panel for C diff requested on admission Continue pain control with fentanyl and hydrocodone.   2.  ESRD on HD (M-W-F), hypokalemia and hyponatremia. Patient with no hypervolemia, her renal function shows NA 132, K 3,5 and serum bicarbonate 25. Per admission note nephrology is aware of patient and will need to continue inpatient renal replacement therapy per her scheduled on Friday.   Anemia of chronic renal disease, her hgb is stable, continue with Velphoro.   3. CAD, PVD sp bilateral BKA/ dyslipidemia. Continue with rosuvastatin, patient is not on aspirin.   4. HTN. Continue blood pressure control with amlodipine and nebivolol.   5. T2DM. Patient is currently NPO, will continue insulin sliding scale for glucose cover and monitoring.  Capillary glucose 84, 114 and 92.   Patient continue to be at high risk for worsening abdominal pain   Status is: Inpatient  Remains inpatient appropriate because: Inpatient management of abdominal pain.   DVT prophylaxis: Heparin   Code Status:    ful  Family Communication:   I spoke with patient's nice at the bedside, we talked in detail about patient's condition, plan of care and prognosis and all questions were addressed.  Consultants:  Surgery  Nephrology    Subjective: Patient with no nausea or vomiting, continue to have diarrhea and  lower abdominal pain, no chest pain or dsypnea,.   Objective: Vitals:   06/26/21 0200 06/26/21 0235 06/26/21 0240 06/26/21 0826  BP: (!) 136/55  (!) 121/54 (!) 141/54  Pulse: 66  63 65  Resp: 12  18   Temp: 97.6 F (36.4 C)  97.7 F (36.5 C) 98.2 F (36.8 C)  TempSrc: Oral  Oral Oral  SpO2: 92%  92% 96%  Weight:  53.3 kg    Height:  5' (1.524 m)      Intake/Output Summary (Last 24 hours) at 06/26/2021 0945 Last data filed at 06/26/2021 0600 Gross per 24 hour  Intake 97.79 ml  Output 0 ml  Net 97.79 ml   Filed Weights   06/25/21 1948 06/26/21 0235  Weight: 59 kg 53.3 kg    Examination:   General: Not in pain or dyspnea. Deconditioned  Neurology: Awake and alert, non focal  E ENT: positive pallor, no icterus, oral mucosa moist Cardiovascular: No JVD. S1-S2 present, rhythmic, no gallops, rubs, or murmurs. . Pulmonary: positive breath sounds bilaterally, adequate air movement, no wheezing, rhonchi or rales. Gastrointestinal. Abdomen soft, tender to palpation at the suprapubic region. Negative Murphy sign.  Skin. No rashes Musculoskeletal: bilateral BKA    Data Reviewed: I have personally reviewed following labs and imaging studies  CBC: Recent Labs  Lab 06/25/21 0835 06/26/21 0335  WBC 5.3 5.4  NEUTROABS 3.0 2.6  HGB 11.2* 10.9*  HCT 33.4* 31.8*  MCV 90.5 90.1  PLT 95* 71*   Basic Metabolic Panel: Recent Labs  Lab 06/25/21 0835 06/25/21 1659 06/26/21 0335  NA 136 134* 132*  K 3.3* 3.2* 3.5  CL 92* 93* 93*  CO2 30 28 25   GLUCOSE 77 77 50*  BUN 8 11 12   CREATININE 4.84* 5.34* 5.72*  CALCIUM 9.9 9.6 9.6  MG 1.8  --  1.9  PHOS  --   --  4.0   GFR: Estimated Creatinine Clearance: 6.7 mL/min (A) (by C-G formula based on SCr of 5.72 mg/dL (H)). Liver Function Tests: Recent Labs  Lab 06/25/21 0835 06/25/21 1659 06/26/21 0335  AST 91* 86* 76*  ALT 32 33 31  ALKPHOS 124 126 111  BILITOT 1.2 1.1 0.9  PROT 5.4* 5.2* 5.1*  ALBUMIN 2.4* 2.2* 2.2*    Recent Labs  Lab 06/25/21 0835 06/25/21 1659  LIPASE 31 29   No results for input(s): AMMONIA in the last 168 hours. Coagulation Profile: No results for input(s): INR, PROTIME in the last 168 hours. Cardiac Enzymes: No results for input(s): CKTOTAL, CKMB, CKMBINDEX, TROPONINI in the last 168 hours. BNP (last 3 results) No results for input(s): PROBNP in the last 8760 hours. HbA1C: Recent Labs    06/25/21 1852  HGBA1C 5.4   CBG: Recent Labs  Lab 06/25/21 1959 06/26/21 0023 06/26/21 0124 06/26/21 0359 06/26/21 0824  GLUCAP 73 58* 84 114* 92   Lipid Profile: No results for input(s): CHOL, HDL, LDLCALC, TRIG, CHOLHDL, LDLDIRECT in the last 72 hours. Thyroid Function Tests: Recent Labs    06/26/21 0054  TSH 4.402   Anemia Panel: No results for input(s): VITAMINB12, FOLATE, FERRITIN, TIBC, IRON, RETICCTPCT in the last 72 hours.    Radiology Studies: I have reviewed all of the imaging during this hospital visit personally     Scheduled Meds:  [START ON 06/27/2021] amLODipine  10 mg Oral Daily  brimonidine  1 drop Both Eyes TID   cycloSPORINE  1 drop Both Eyes BID   insulin aspart  0-6 Units Subcutaneous Q4H   nebivolol  5 mg Oral Daily   pantoprazole  40 mg Oral Daily   rosuvastatin  40 mg Oral Daily   sodium chloride flush  3 mL Intravenous Q12H   sucroferric oxyhydroxide  500 mg Oral BID   Continuous Infusions:  sodium chloride       LOS: 1 day        Jossilyn Benda Gerome Apley, MD

## 2021-06-27 DIAGNOSIS — R197 Diarrhea, unspecified: Secondary | ICD-10-CM | POA: Diagnosis not present

## 2021-06-27 DIAGNOSIS — K8020 Calculus of gallbladder without cholecystitis without obstruction: Secondary | ICD-10-CM | POA: Diagnosis not present

## 2021-06-27 DIAGNOSIS — N186 End stage renal disease: Secondary | ICD-10-CM | POA: Diagnosis not present

## 2021-06-27 DIAGNOSIS — K8050 Calculus of bile duct without cholangitis or cholecystitis without obstruction: Secondary | ICD-10-CM | POA: Diagnosis not present

## 2021-06-27 LAB — CBC
HCT: 32.6 % — ABNORMAL LOW (ref 36.0–46.0)
Hemoglobin: 11 g/dL — ABNORMAL LOW (ref 12.0–15.0)
MCH: 30.2 pg (ref 26.0–34.0)
MCHC: 33.7 g/dL (ref 30.0–36.0)
MCV: 89.6 fL (ref 80.0–100.0)
Platelets: 102 10*3/uL — ABNORMAL LOW (ref 150–400)
RBC: 3.64 MIL/uL — ABNORMAL LOW (ref 3.87–5.11)
RDW: 19.1 % — ABNORMAL HIGH (ref 11.5–15.5)
WBC: 6.5 10*3/uL (ref 4.0–10.5)
nRBC: 0.5 % — ABNORMAL HIGH (ref 0.0–0.2)

## 2021-06-27 LAB — BASIC METABOLIC PANEL WITH GFR
Anion gap: 12 (ref 5–15)
BUN: 7 mg/dL — ABNORMAL LOW (ref 8–23)
CO2: 26 mmol/L (ref 22–32)
Calcium: 9.3 mg/dL (ref 8.9–10.3)
Chloride: 98 mmol/L (ref 98–111)
Creatinine, Ser: 3.66 mg/dL — ABNORMAL HIGH (ref 0.44–1.00)
GFR, Estimated: 13 mL/min — ABNORMAL LOW
Glucose, Bld: 93 mg/dL (ref 70–99)
Potassium: 3.7 mmol/L (ref 3.5–5.1)
Sodium: 136 mmol/L (ref 135–145)

## 2021-06-27 LAB — HEPATITIS B SURFACE ANTIBODY, QUANTITATIVE: Hep B S AB Quant (Post): 37.5 m[IU]/mL (ref >9.9)

## 2021-06-27 LAB — GLUCOSE, CAPILLARY
Glucose-Capillary: 87 mg/dL (ref 70–99)
Glucose-Capillary: 89 mg/dL (ref 70–99)
Glucose-Capillary: 83 mg/dL (ref 70–99)
Glucose-Capillary: 92 mg/dL (ref 70–99)
Glucose-Capillary: 123 mg/dL — ABNORMAL HIGH (ref 70–99)

## 2021-06-27 MED ORDER — LOPERAMIDE HCL 2 MG PO CAPS: 2.0000 mg | ORAL_CAPSULE | ORAL | Status: DC | PRN

## 2021-06-27 MED ORDER — PSYLLIUM 95 % PO PACK
1.0000 | PACK | Freq: Every day | ORAL | Status: DC
  Administered 2021-06-27 – 2021-07-01 (×5): 1 via ORAL
  Filled 2021-06-27 (×5): qty 1

## 2021-06-27 MED ORDER — ROSUVASTATIN CALCIUM 5 MG PO TABS
10.0000 mg | ORAL_TABLET | Freq: Every day | ORAL | Status: DC
  Administered 2021-06-27: 10 mg via ORAL
  Filled 2021-06-27: qty 2

## 2021-06-27 NOTE — NC FL2 (Signed)
Portal LEVEL OF CARE SCREENING TOOL     IDENTIFICATION  Patient Name: Carolyn Valenzuela Birthdate: Jul 29, 1951 Sex: female Admission Date (Current Location): 06/25/2021  [facility] and [place] Number:  Herbalist and Address:  [facility] [place] [place], [place] [address] [place], [place] [address]      Provider Number: [id number]  Attending Physician Name and Address:  Mercy Riding, MD  Relative Name and Phone Number:  [name] Sister   [phone]    Current Level of Care: Hospital Recommended Level of Care: [place] Prior Approval Number:    Date Approved/Denied:   PASRR Number: [id number]  Discharge Plan: SNF    Current Diagnoses: Patient Active Problem List   Diagnosis Date Noted   Cholelithiases 06/26/2021   Choledocholithiasis 06/25/2021   Lump of right breast 02/16/2021   Gait disorder 02/16/2021   Finger pain 02/16/2021   Right shoulder pain 02/16/2021   Other disorders of plasma-protein metabolism, not elsewhere classified 01/13/2021   Encounter for screening for COVID-19 04/22/2020   Allergy, unspecified, initial encounter 03/28/2020   Anaphylactic shock, unspecified, initial encounter 03/28/2020   Chronic low back pain 03/12/2020   Closed displaced fracture of shaft of fifth metacarpal bone of left hand 04/25/2019   Left hip pain 11/18/2017   GERD (gastroesophageal reflux disease) 10/10/2017   Bloating 10/10/2017   PVD (peripheral vascular disease) ([place])    Phantom pain after amputation of lower extremity ([place]) 01/05/2017   Encounter for removal of sutures 12/01/2016   Abnormal CXR 07/07/2016   Pneumonia, unspecified organism 07/07/2016   Hypotension    NSTEMI (non-ST elevated myocardial infarction) ([place])    Encounter for immunization 05/27/2016   Nausea and vomiting 03/18/2016   Hypokalemia 01/31/2016   Sepsis ([place]) due to UTI 12/20/2015   Acute lower UTI    Labile blood pressure    SIRS  (systemic inflammatory response syndrome) ([place])    Hypoglycemia associated with diabetes ([place])    Urinary retention    Dysphagia    S/P bilateral BKA (below knee amputation) ([place])    Abnormality of gait    Muscle spasm    ESRD on dialysis ([place])    Type 2 diabetes mellitus with diabetic peripheral angiopathy and gangrene, without long-term current use of insulin ([place])    Post-operative pain    Metabolic bone disease    Hypoxia    Type 2 diabetes mellitus with peripheral neuropathy ([place])    Labile blood glucose    Postoperative pain    Acute blood loss anemia    Anemia of chronic disease    Below knee amputation status 11/29/2015   Odynophagia 11/12/2015   Unspecified disorder of calcium metabolism 07/31/2015   Chest pain, atypical 06/06/2015   Ingrown nail 05/28/2015   Other fluid overload 02/15/2015   Shortness of breath 12/13/2014   Diarrhea, unspecified 12/13/2014   Pain, unspecified 12/13/2014   Pruritus, unspecified 12/13/2014   Unspecified protein-calorie malnutrition ([place]) 10/17/2014   Acute pulmonary edema (HCC)    FUO (fever of unknown origin)    Complication of vascular dialysis catheter 09/10/2014   Fever, unspecified 09/10/2014   HCAP (healthcare-associated pneumonia)    Coagulation defect, unspecified ([place]) 09/06/2014   Radiculopathy, cervical region 09/06/2014   End stage renal disease ([place]) 09/06/2014   Essential (primary) hypertension 09/06/2014   Hyperlipidemia, unspecified 09/06/2014   Encounter for screening for respiratory tuberculosis 09/06/2014   Secondary hyperparathyroidism of renal origin ([place]) 09/06/2014   Anemia  in chronic kidney disease 09/06/2014   Type 2 diabetes mellitus with ESRD (end-stage renal disease) ([place]) 09/01/2014   Anemia of renal disease 09/01/2014   Venous insufficiency of left leg 08/27/2014   Rash 07/06/2014   Diabetic wet gangrene of the foot - Right 03/15/2014   Critical lower limb ischemia ([place]) 01/09/2014   Right foot  ulcer ([place]) 11/07/2013   Toe pain 09/06/2013   Pre-ulcerative corn or callous 09/06/2013   Lower extremity pain 09/06/2013   Low back pain 12/08/2011   Hip bursitis, left 12/08/2011   Left leg pain 12/08/2011   Abdominal pain 07/08/2011   Encounter for well adult exam with abnormal findings 12/08/2010   Allergic rhinitis, cause unspecified 12/08/2010   Type 2 diabetes mellitus with diabetic chronic kidney disease ([place]) 05/21/2009   SECONDARY HYPERPARATHYROIDISM 05/21/2009   NUMBNESS 05/21/2009   BACK PAIN 05/02/2009   ANEMIA-IRON DEFICIENCY 01/25/2008   Essential hypertension 01/25/2008   Brachial neuritis or radiculitis 01/25/2008   Hyperlipidemia 03/30/2007    Orientation RESPIRATION BLADDER Height & Weight     Self, Time, Situation, Place  Normal Incontinent Weight: 113 lb 15.7 oz (51.7 kg) Height:  5' (152.4 cm)  BEHAVIORAL SYMPTOMS/MOOD NEUROLOGICAL BOWEL NUTRITION STATUS      Incontinent Diet (see discharge summary)  AMBULATORY STATUS COMMUNICATION OF NEEDS Skin   Limited Assist Verbally Normal                       Personal Care Assistance Level of Assistance  Bathing, Feeding, Dressing Bathing Assistance: Limited assistance Feeding assistance: Limited assistance Dressing Assistance: Limited assistance     Functional Limitations Info  Sight, Hearing, Speech Sight Info: Adequate Hearing Info: Adequate Speech Info: Adequate    SPECIAL CARE FACTORS FREQUENCY  PT (By licensed PT), OT (By licensed OT)     PT Frequency: 5x week OT Frequency: 5x week            Contractures Contractures Info: Not present    Additional Factors Info  Code Status, Allergies, Insulin Sliding Scale Code Status Info: full Allergies Info: oxycodone   Insulin Sliding Scale Info: Novolog: 0-6 units, q4 hours.  See discharge summary.       Current Medications (06/27/2021):  This is the current hospital active medication list Current Facility-Administered Medications   Medication Dose Route Frequency Provider Last Rate Last Admin   0.9 %  sodium chloride infusion  250 mL Intravenous PRN Doutova, Anastassia, MD       acetaminophen (TYLENOL) tablet 650 mg  650 mg Oral Q6H PRN Doutova, Anastassia, MD       Or   acetaminophen (TYLENOL) suppository 650 mg  650 mg Rectal Q6H PRN Doutova, Anastassia, MD       albuterol (VENTOLIN HFA) 108 (90 Base) MCG/ACT inhaler 2 puff  2 puff Inhalation Q6H PRN Doutova, Anastassia, MD       amLODipine (NORVASC) tablet 10 mg  10 mg Oral Daily Doutova, Anastassia, MD   10 mg at 06/27/21 1200   brimonidine (ALPHAGAN) 0.15 % ophthalmic solution 1 drop  1 drop Both Eyes TID Doutova, Anastassia, MD   1 drop at 06/27/21 1201   Chlorhexidine Gluconate Cloth 2 % PADS 6 each  6 each Topical Q0600 Janalee Dane, PA-C   6 each at 06/27/21 0408   cinacalcet (SENSIPAR) tablet 30 mg  30 mg Oral Q M,W,F-1800 Collins, Samantha G, PA-C       cycloSPORINE (RESTASIS) 0.05 % ophthalmic emulsion 1  drop  1 drop Both Eyes BID Toy Baker, MD   1 drop at 06/27/21 1200   fentaNYL (SUBLIMAZE) injection 12.5-50 mcg  12.5-50 mcg Intravenous Q2H PRN Toy Baker, MD       heparin injection 5,000 Units  5,000 Units Subcutaneous Q8H Arrien, Jimmy Picket, MD   5,000 Units at 06/27/21 0530   HYDROcodone-acetaminophen (NORCO/VICODIN) 5-325 MG per tablet 1-2 tablet  1-2 tablet Oral Q4H PRN Toy Baker, MD       insulin aspart (novoLOG) injection 0-6 Units  0-6 Units Subcutaneous Q4H Doutova, Anastassia, MD       loperamide (IMODIUM) capsule 2 mg  2 mg Oral PRN Mercy Riding, MD       nebivolol (BYSTOLIC) tablet 5 mg  5 mg Oral Daily Doutova, Anastassia, MD   5 mg at 06/27/21 1213   ondansetron (ZOFRAN) tablet 4 mg  4 mg Oral Q6H PRN Toy Baker, MD       Or   ondansetron (ZOFRAN) injection 4 mg  4 mg Intravenous Q6H PRN Doutova, Anastassia, MD       pantoprazole (PROTONIX) EC tablet 40 mg  40 mg Oral Daily Doutova, Anastassia,  MD   40 mg at 06/27/21 1200   psyllium (HYDROCIL/METAMUCIL) 1 packet  1 packet Oral Daily Gonfa, Taye T, MD       rosuvastatin (CRESTOR) tablet 10 mg  10 mg Oral Daily Wendee Beavers T, MD   10 mg at 06/27/21 1200   sodium chloride flush (NS) 0.9 % injection 3 mL  3 mL Intravenous Q12H Doutova, Anastassia, MD   3 mL at 06/27/21 1158   sodium chloride flush (NS) 0.9 % injection 3 mL  3 mL Intravenous PRN Toy Baker, MD       sucroferric oxyhydroxide (VELPHORO) chewable tablet 500 mg  500 mg Oral BID Toy Baker, MD   500 mg at 06/27/21 1200     Discharge Medications: Please see discharge summary for a list of discharge medications.  Relevant Imaging Results:  Relevant Lab Results:   Additional Information SSN: [id number]. [facility]: out[facility]pt HD at [facility] on MWF, pt is vaccinated for covid with 2 boosters.  Bilateral BKA/prosthesis  Joanne Chars, LCSW

## 2021-06-27 NOTE — Progress Notes (Signed)
PROGRESS NOTE  Carolyn Valenzuela OQH:[id number] DOB: 06/29/1951   PCP: Biagio Borg, MD  Patient is from: Home.  Lives alone.  DOA: 06/25/2021 LOS: 1  Chief complaints:  Chief Complaint  Patient presents with   Diarrhea   Weakness     Brief Narrative / Interim history: 70 year old F with PMH of ESRD on HD MWF, DM-2, PAD/bilateral BKA, CAD, HTN and dyslipidemia presenting with abdominal pain, diarrhea and suprapubic discomfort, and admitted with cholelithiasis to rule out cholecystitis.  Vitals and blood work in ED without significant finding.  CT abdomen and pelvis with cholelithiasis and distended gallbladder but no cholecystitis or biliary ductal dilation.  CT chest with small bilateral pleural effusions.  RUQ ultrasound showed distended gallbladder with gallbladder wall thickening and multiple large gallstones and sludge but no sonographic Murphy sign.  CBD 6.7 mm.  HIDA scan negative for acute cholecystitis.  C. difficile and GIP panel ordered but has not been collected yet.  Patient reports improvement but not a specific.   Subjective: No major events overnight of this morning.  No complaints.  Reports improvement but not specific about which symptoms as improved.  Stool is watery but not as frequent.  Had 1 BM last night and this morning.  Denies melena or hematochezia.  Denies nausea or vomiting.  Denies abdominal pain.  Objective: Vitals:   06/26/21 1811 06/26/21 1921 06/26/21 2343 06/27/21 0429  BP: (!) 90/40 (!) 114/26 (!) 134/47 (!) 150/96  Pulse: 62 65 64 65  Resp: 15 19 20 20   Temp: 98.2 F (36.8 C) 97.9 F (36.6 C) 97.8 F (36.6 C) 98 F (36.7 C)  TempSrc: Oral Oral Oral Oral  SpO2: 100% 100% 100% 99%  Weight: 53.5 kg   51.7 kg  Height:        Intake/Output Summary (Last 24 hours) at 06/27/2021 1353 Last data filed at 06/27/2021 5465 Gross per 24 hour  Intake 120 ml  Output 1120 ml  Net -1000 ml   Filed Weights   06/26/21 1453 06/26/21 1811 06/27/21 0429   Weight: 54.5 kg 53.5 kg 51.7 kg    Examination:  GENERAL: Frail looking elderly female.  No apparent distress. HEENT: MMM.  Vision and hearing grossly intact.  NECK: Supple.  No apparent JVD.  RESP: On RA.  No IWOB.  Fair aeration bilaterally. CVS:  RRR. Heart sounds normal.  ABD/GI/GU: BS+.  Abdomen soft.  Mild diffuse tenderness but no rebound or guarding.  Negative Murphy sign. MSK/EXT:  Moves extremities.  Bilateral BKA. SKIN: no apparent skin lesion or wound NEURO: Awake and alert.  Oriented to self, place and year but not to date and month.  Fair insight.  No apparent focal neuro deficit. PSYCH: Calm. Normal affect.   Procedures:  None  Microbiology summarized: KCLEX-51 and influenza PCR nonreactive. C. difficile and GIP panel pending.  Assessment & Plan: Abdominal pain/tenderness/diarrhea-suspect functional diarrhea and possible biliary colic from cholelithiasis.  CT and RUQ ultrasound with gallbladder wall thickening and sludge but HIDA scan negative for cholecystitis.  Does not seem to have nausea, vomiting, fever or chills to suggest infectious process.  Mild diffuse tenderness but negative Murphy.  No rebound or guarding either.  C. difficile and GIP panel ordered on admission but stool sample has not been sent yet. -Ordered C. difficile and GIP panel again.  Requested RN to collect and send stool sample -Meanwhile, I am inclined to start Imodium and psyllium  Cholelithiasis without cholecystitis-CT, RUQ [place] and HIDA as  above. -Outpatient follow-up with general surgery   ESRD on HD (M-W-F): Had short HD session off schedule on 10/27.   -Plan for HD off schedule on 10/29, then resume regular schedule on Monday per nephrology   Hypokalemia/hyponatremia: Resolved.    Anemia of renal disease: Stable. Recent Labs    02/13/21 1115 06/25/21 0835 06/26/21 0335 06/27/21 0020  HGB 10.0* 11.2* 10.9* 11.0*  -Per nephrology  CAD/PVD/bilateral BKA-uses prosthetic  legs -Decrease Crestor to ESRD dose -Continue aspirin -PT/OT eval  Essential HTN/chronic diastolic CHF: BP slightly elevated this morning but normotensive for most part.  Appears euvolemic on exam. -Fluid management by dialysis -Continue blood pressure control with amlodipine and nebivolol.    Controlled DM-2 with ESRD: A1c 5.4%. Recent Labs  Lab 06/26/21 1940 06/26/21 2317 06/27/21 0420 06/27/21 0727 06/27/21 1201  GLUCAP 65* 87 87 89 83  -Discontinue CBG monitoring and SSI    Body mass index is 22.26 kg/m.         DVT prophylaxis:  heparin injection 5,000 Units Start: 06/26/21 1400 SCDs Start: 06/25/21 2235  Code Status: Full code Family Communication: Attempted to call patient's sister for update but no answer. Level of care: Telemetry Medical Status is: Observation  The patient will require care spanning > 2 midnights and should be moved to inpatient because: Diagnostic evaluation for ongoing watery diarrhea, need for hemodialysis off schedule to get back on schedule.   Consultants:  Nephrology   Sch Meds:  Scheduled Meds:  amLODipine  10 mg Oral Daily   brimonidine  1 drop Both Eyes TID   Chlorhexidine Gluconate Cloth  6 each Topical Q0600   cinacalcet  30 mg Oral Q M,W,F-1800   cycloSPORINE  1 drop Both Eyes BID   heparin injection (subcutaneous)  5,000 Units Subcutaneous Q8H   insulin aspart  0-6 Units Subcutaneous Q4H   nebivolol  5 mg Oral Daily   pantoprazole  40 mg Oral Daily   rosuvastatin  10 mg Oral Daily   sodium chloride flush  3 mL Intravenous Q12H   sucroferric oxyhydroxide  500 mg Oral BID   Continuous Infusions:  sodium chloride     PRN Meds:.sodium chloride, acetaminophen **OR** acetaminophen, albuterol, fentaNYL (SUBLIMAZE) injection, HYDROcodone-acetaminophen, ondansetron **OR** ondansetron (ZOFRAN) IV, sodium chloride flush  Antimicrobials: Anti-infectives (From admission, onward)    Start     Dose/Rate Route Frequency Ordered  Stop   06/25/21 1715  cefTRIAXone (ROCEPHIN) 1 g in sodium chloride 0.9 % 100 mL IVPB        1 g 200 mL/hr over 30 Minutes Intravenous  Once 06/25/21 1709 06/25/21 1801        I have personally reviewed the following labs and images: CBC: Recent Labs  Lab 06/25/21 0835 06/26/21 0335 06/27/21 0020  WBC 5.3 5.4 6.5  NEUTROABS 3.0 2.6  --   HGB 11.2* 10.9* 11.0*  HCT 33.4* 31.8* 32.6*  MCV 90.5 90.1 89.6  PLT 95* 71* 102*   BMP &GFR Recent Labs  Lab 06/25/21 0835 06/25/21 1659 06/26/21 0335 06/27/21 0415  NA 136 134* 132* 136  K 3.3* 3.2* 3.5 3.7  CL 92* 93* 93* 98  CO2 30 28 25 26   GLUCOSE 77 77 50* 93  BUN 8 11 12  7*  CREATININE 4.84* 5.34* 5.72* 3.66*  CALCIUM 9.9 9.6 9.6 9.3  MG 1.8  --  1.9  --   PHOS  --   --  4.0  --    Estimated Creatinine Clearance: 10.4  mL/min (A) (by C-G formula based on SCr of 3.66 mg/dL (H)). Liver & Pancreas: Recent Labs  Lab 06/25/21 0835 06/25/21 1659 06/26/21 0335  AST 91* 86* 76*  ALT 32 33 31  ALKPHOS 124 126 111  BILITOT 1.2 1.1 0.9  PROT 5.4* 5.2* 5.1*  ALBUMIN 2.4* 2.2* 2.2*   Recent Labs  Lab 06/25/21 0835 06/25/21 1659  LIPASE 31 29   No results for input(s): AMMONIA in the last 168 hours. Diabetic: Recent Labs    06/25/21 1852  HGBA1C 5.4   Recent Labs  Lab 06/26/21 1940 06/26/21 2317 06/27/21 0420 06/27/21 0727 06/27/21 1201  GLUCAP 65* 87 87 89 83   Cardiac Enzymes: No results for input(s): CKTOTAL, CKMB, CKMBINDEX, TROPONINI in the last 168 hours. No results for input(s): PROBNP in the last 8760 hours. Coagulation Profile: No results for input(s): INR, PROTIME in the last 168 hours. Thyroid Function Tests: Recent Labs    06/26/21 0054  TSH 4.402   Lipid Profile: No results for input(s): CHOL, HDL, LDLCALC, TRIG, CHOLHDL, LDLDIRECT in the last 72 hours. Anemia Panel: No results for input(s): VITAMINB12, FOLATE, FERRITIN, TIBC, IRON, RETICCTPCT in the last 72 hours. Urine analysis:     Component Value Date/Time   COLORURINE YELLOW 09/15/2016 1300   APPEARANCEUR CLEAR 09/15/2016 1300   LABSPEC 1.010 09/15/2016 1300   PHURINE 7.5 09/15/2016 1300   GLUCOSEU 250 (A) 09/15/2016 1300   HGBUR SMALL (A) 09/15/2016 1300   BILIRUBINUR NEGATIVE 09/15/2016 1300   KETONESUR NEGATIVE 09/15/2016 1300   PROTEINUR >300 (A) 03/19/2016 0206   UROBILINOGEN 0.2 09/15/2016 1300   NITRITE NEGATIVE 09/15/2016 1300   LEUKOCYTESUR NEGATIVE 09/15/2016 1300   Sepsis Labs: Invalid input(s): PROCALCITONIN, [place]  Microbiology: Recent Results (from the past 240 hour(s))  Resp Panel by RT-PCR (Flu A&B, Covid) Nasopharyngeal Swab     Status: None   Collection Time: 06/25/21  6:28 PM   Specimen: Nasopharyngeal Swab; Nasopharyngeal(NP) swabs in vial transport medium  Result Value Ref Range Status   SARS Coronavirus 2 by RT PCR NEGATIVE NEGATIVE Final    Comment: (NOTE) SARS-CoV-2 target nucleic acids are NOT DETECTED.  The SARS-CoV-2 RNA is generally detectable in upper respiratory specimens during the acute phase of infection. The lowest concentration of SARS-CoV-2 viral copies this assay can detect is 138 copies/mL. A negative result does not preclude SARS-Cov-2 infection and should not be used as the sole basis for treatment or other patient management decisions. A negative result may occur with  improper specimen collection/handling, submission of specimen other than nasopharyngeal swab, presence of viral mutation(s) within the areas targeted by this assay, and inadequate number of viral copies(<138 copies/mL). A negative result must be combined with clinical observations, patient history, and epidemiological information. The expected result is Negative.  Fact Sheet for Patients:  EntrepreneurPulse.com.au  Fact Sheet for Healthcare Providers:  IncredibleEmployment.be  This test is no t yet approved or cleared by the [place] FDA and   has been authorized for detection and/or diagnosis of SARS-CoV-2 by FDA under an Emergency Use Authorization (EUA). This EUA will remain  in effect (meaning this test can be used) for the duration of the COVID-19 declaration under Section 564(b)(1) of the Act, 21 U.S.C.section 360bbb-3(b)(1), unless the authorization is terminated  or revoked sooner.       Influenza A by PCR NEGATIVE NEGATIVE Final   Influenza B by PCR NEGATIVE NEGATIVE Final    Comment: (NOTE) The Xpert Xpress SARS-CoV-2/FLU/RSV plus assay is  intended as an aid in the diagnosis of influenza from Nasopharyngeal swab specimens and should not be used as a sole basis for treatment. Nasal washings and aspirates are unacceptable for Xpert Xpress SARS-CoV-2/FLU/RSV testing.  Fact Sheet for Patients: EntrepreneurPulse.com.au  Fact Sheet for Healthcare Providers: IncredibleEmployment.be  This test is not yet approved or cleared by the [place] FDA and has been authorized for detection and/or diagnosis of SARS-CoV-2 by FDA under an Emergency Use Authorization (EUA). This EUA will remain in effect (meaning this test can be used) for the duration of the COVID-19 declaration under Section 564(b)(1) of the Act, 21 U.S.C. section 360bbb-3(b)(1), unless the authorization is terminated or revoked.  Performed at [facility], [place] [address] [place], [place] [address]     Radiology Studies: No results found.    Jerel Sardina T. [place]  If 7PM-7AM, please contact night-coverage [email or web address] 06/27/2021, 1:53 PM

## 2021-06-27 NOTE — Evaluation (Signed)
Physical Therapy Evaluation Patient Details Name: Carolyn Valenzuela MRN: [id number] DOB: 1951/07/24 Today's Date: 06/27/2021  History of Present Illness  Pt adm 10/26 with abdominal pain and diarrhea. CT showed cholelithiasis. PMH - ESRD on HD, Bil BKA, PAD, DM, HTN, CAD.  Clinical Impression  Pt was seen for evaluation of movement, focusing on her ability to don prostheses, to stand and to take steps with help.  Pt is unsteady on side of bed to balance and don legs, but does  understand the sequence to make this happen.  Pt is requiring help to stand and balance, to clear obstacles on her walker and to set up safely to sit.  In light of all the challenges will recommend SNF for a very short stay and then to transition home with family and caregiver help.  Pt is mildly confused and unable to give a consistent return on demonstration for safety with any movement.   Educate on safety with balance on walker, sitting balance safety and to know when to ask for help (any moving from sitting).  If pt can get help 24/7 at home would consider that a safe plan, but otherwise will need a short time in SNF for recovery of safety and independence.     Recommendations for follow up therapy are one component of a multi-disciplinary discharge planning process, led by the attending physician.  Recommendations may be updated based on patient status, additional functional criteria and insurance authorization.  Follow Up Recommendations Skilled nursing-short term rehab (<3 hours/day)    Assistance Recommended at Discharge Frequent or constant Supervision/Assistance  Functional Status Assessment Patient has had a recent decline in their functional status and demonstrates the ability to make significant improvements in function in a reasonable and predictable amount of time.  Equipment Recommendations  Wheelchair (measurements PT);None recommended by PT    Recommendations for Other Services       Precautions /  Restrictions Precautions Precautions: Fall Precaution Comments: B BKA with prosthetics in room, socks are missing for one Restrictions Weight Bearing Restrictions: No      Mobility  Bed Mobility Overal bed mobility: Needs Assistance Bed Mobility: Supine to Sit     Supine to sit: Min assist;Mod assist     General bed mobility comments: min assist to sit up and support trunk, mod to pivot hips and complete the task    Transfers Overall transfer level: Needs assistance Equipment used: Rolling walker (2 wheels);1 person hand held assist Transfers: Sit to/from Stand Sit to Stand: Min assist;From elevated surface           General transfer comment: min assist to cue hand placement and control initial standing stability    Ambulation/Gait Ambulation/Gait assistance: Min assist Gait Distance (Feet): 9 Feet (4+5) Assistive device: Rolling walker (2 wheels);1 person hand held assist Gait Pattern/deviations: Step-through pattern;Wide base of support;Decreased stride length;Step-to pattern Gait velocity: reduced Gait velocity interpretation: <1.31 ft/sec, indicative of household ambulator General Gait Details: mostly step to pattern from bed to chair and to recliner, short steps with instruction for direction and clearing obstacles with walker  Stairs            Wheelchair Mobility    Modified Rankin (Stroke Patients Only)       Balance Overall balance assessment: Needs assistance;History of Falls Sitting-balance support: Feet supported;Bilateral upper extremity supported Sitting balance-Leahy Scale: Fair Sitting balance - Comments: poor dynamic sitting   Standing balance support: Bilateral upper extremity supported;During functional activity Standing balance-Leahy Scale: Poor  Pertinent Vitals/Pain Pain Assessment: Faces Faces Pain Scale: Hurts a little bit Pain Location: R inner thigh on scar from vasc procedure with  application of silicone prosthetic sleeve Pain Descriptors / Indicators: Stabbing;Sharp Pain Intervention(s): Monitored during session;Repositioned    Home Living Family/patient expects to be discharged to:: Skilled nursing facility Living Arrangements: Alone Available Help at Discharge: Family;Available PRN/intermittently Type of Home: Apartment Home Access: Ramped entrance       Home Layout: One level Home Equipment: Wheelchair - Publishing copy (2 wheels);Tub bench Additional Comments: pt does not have help consistently    Prior Function Prior Level of Function : Needs assist;History of Falls (last six months)  Cognitive Assist : Mobility (cognitive) Mobility (Cognitive): Step by step cues   Physical Assist : Mobility (physical) Mobility (physical): Bed mobility;Transfers;Gait ADLs (physical): Toileting Mobility Comments: pt reports she is fine at home but notes indicate issues       Hand Dominance   Dominant Hand: Right    Extremity/Trunk Assessment   Upper Extremity Assessment Upper Extremity Assessment: Generalized weakness    Lower Extremity Assessment Lower Extremity Assessment: Generalized weakness    Cervical / Trunk Assessment Cervical / Trunk Assessment: Other exceptions (core weakness, drifting backward on bed)  Communication   Communication: No difficulties  Cognition Arousal/Alertness: Awake/alert Behavior During Therapy: WFL for tasks assessed/performed;Impulsive Overall Cognitive Status: Impaired/Different from baseline Area of Impairment: Awareness;Problem solving;Safety/judgement;Following commands;Memory;Attention;Orientation                 Orientation Level: Time Current Attention Level: Selective Memory: Decreased short-term memory;Decreased recall of precautions Following Commands: Follows one step commands inconsistently;Follows one step commands with increased time Safety/Judgement: Decreased awareness of safety;Decreased  awareness of deficits Awareness: Intellectual;Emergent Problem Solving: Slow processing;Requires verbal cues General Comments: asking PT to do things before more imminent needs are met, and requires redirection        General Comments General comments (skin integrity, edema, etc.): pt is assisted to fit and steady legs with prosthetics, but is using a sock from hallway clean supply for one prosthesis.  Pt reports this is what she currently has to do walking    Exercises     Assessment/Plan    PT Assessment Patient needs continued PT services  PT Problem List Decreased strength;Decreased range of motion;Decreased activity tolerance;Decreased balance;Decreased mobility;Decreased coordination;Decreased cognition;Decreased knowledge of use of DME;Decreased safety awareness;Decreased skin integrity;Pain       PT Treatment Interventions DME instruction;Gait training;Functional mobility training;Therapeutic activities;Therapeutic exercise;Balance training;Neuromuscular re-education;Patient/family education    PT Goals (Current goals can be found in the Care Plan section)  Acute Rehab PT Goals Patient Stated Goal: to get stronger and walk alone more PT Goal Formulation: With patient Time For Goal Achievement: 07/11/21 Potential to Achieve Goals: Good    Frequency Min 2X/week   Barriers to discharge Inaccessible home environment;Decreased caregiver support pt is home alone with help needed for all mobility    Co-evaluation               AM-PAC PT "6 Clicks" Mobility  Outcome Measure Help needed turning from your back to your side while in a flat bed without using bedrails?: A Lot Help needed moving from lying on your back to sitting on the side of a flat bed without using bedrails?: A Lot Help needed moving to and from a bed to a chair (including a wheelchair)?: A Lot Help needed standing up from a chair using your arms (e.g., wheelchair or bedside chair)?: A Little Help  needed to walk in hospital room?: A Lot Help needed climbing 3-5 steps with a railing? : A Lot 6 Click Score: 13    End of Session Equipment Utilized During Treatment: Gait belt Activity Tolerance: Patient limited by fatigue;Patient limited by pain Patient left: in chair;with call bell/phone within reach;with chair alarm set;with nursing/sitter in room Nurse Communication: Mobility status;Other (comment) (instructions for return to bed) PT Visit Diagnosis: Unsteadiness on feet (R26.81);Muscle weakness (generalized) (M62.81);History of falling (Z91.81);Difficulty in walking, not elsewhere classified (R26.2);Pain Pain - Right/Left: Right Pain - part of body: Leg    Time: 7356-7014 PT Time Calculation (min) (ACUTE ONLY): 42 min   Charges:   PT Evaluation $PT Eval Moderate Complexity: 1 Mod PT Treatments $Gait Training: 8-22 mins $Therapeutic Activity: 8-22 mins       Ramond Dial 06/27/2021, 1:03 PM  Mee Hives, PT PhD Acute Rehab Dept. Number: [place] [facility] [place]

## 2021-06-27 NOTE — Plan of Care (Signed)

## 2021-06-27 NOTE — TOC Initial Note (Signed)
Transition of Care [facility]) - Initial/Assessment Note    Patient Details  Name: Carolyn Valenzuela MRN: [id number] Date of Birth: Mar 01, 1951  Transition of Care [facility]) CM/SW Contact:    Joanne Chars, LCSW Phone Number: 06/27/2021, 2:15 PM  Clinical Narrative:   CSW met with pt regarding recommendation for SNF. Pt agreeable, choice document given, permission given to send out referral in hub.  Permission given to speak with both sisters and brother.  Pt has been vaccinated for covid with 2 boosters.                  Expected Discharge Plan: Skilled Nursing Facility Barriers to Discharge: Continued Medical Work up, SNF Pending bed offer   Patient Goals and CMS Choice Patient states their goals for this hospitalization and ongoing recovery are:: "keep doing it" CMS Medicare.gov Compare Post Acute Care list provided to:: Patient Choice offered to / list presented to : Patient  Expected Discharge Plan and Services Expected Discharge Plan: [place] Choice: [place] Living arrangements for the past 2 months: Apartment                                      Prior Living Arrangements/Services Living arrangements for the past 2 months: Apartment Lives with:: Self Patient language and need for interpreter reviewed:: Yes Do you feel safe going back to the place where you live?: Yes      Need for Family Participation in Patient Care: No (Comment) Care giver support system in place?: Yes (comment) Current home services: Other (comment) (none) Criminal Activity/Legal Involvement Pertinent to Current Situation/Hospitalization: No - Comment as needed  Activities of Daily Living Home Assistive Devices/Equipment: Prosthesis ADL Screening (condition at time of admission) Patient's cognitive ability adequate to safely complete daily activities?: Yes Is the patient deaf or have difficulty hearing?: No Does the patient have difficulty  seeing, even when wearing glasses/contacts?: No Does the patient have difficulty concentrating, remembering, or making decisions?: No Patient able to express need for assistance with ADLs?: Yes Does the patient have difficulty dressing or bathing?: Yes Independently performs ADLs?: No Communication: Independent Dressing (OT): Needs assistance Is this a change from baseline?: Pre-admission baseline Grooming: Needs assistance Is this a change from baseline?: Pre-admission baseline Feeding: Needs assistance Is this a change from baseline?: Pre-admission baseline Bathing: Needs assistance Is this a change from baseline?: Pre-admission baseline Toileting: Needs assistance Is this a change from baseline?: Pre-admission baseline In/Out Bed: Needs assistance Is this a change from baseline?: Pre-admission baseline Walks in Home: Needs assistance Is this a change from baseline?: Pre-admission baseline Does the patient have difficulty walking or climbing stairs?: Yes Weakness of Legs: Both Weakness of Arms/Hands: Both  Permission Sought/Granted Permission sought to share information with : Family Supports Permission granted to share information with : Yes, Verbal Permission Granted  Share Information with NAME: sisters [name] and [name], brother Fritz Pickerel  Permission granted to share info w AGENCY: SNF        Emotional Assessment Appearance:: Appears older than stated age Attitude/Demeanor/Rapport: Engaged Affect (typically observed): Appropriate, Pleasant Orientation: : Oriented to Self, Oriented to Place, Oriented to  Time, Oriented to Situation Alcohol / Substance Use: Not Applicable Psych Involvement: No (comment)  Admission diagnosis:  Biliary colic [Q22.29] Choledocholithiasis [K80.50] ESRD on dialysis ([place]) [N18.6, Z99.2] Calculus of gallbladder without cholecystitis without obstruction [K80.20]  Diarrhea, unspecified type [R19.7] Patient Active Problem List   Diagnosis Date  Noted   Cholelithiases 06/26/2021   Choledocholithiasis 06/25/2021   Lump of right breast 02/16/2021   Gait disorder 02/16/2021   Finger pain 02/16/2021   Right shoulder pain 02/16/2021   Other disorders of plasma-protein metabolism, not elsewhere classified 01/13/2021   Encounter for screening for COVID-19 04/22/2020   Allergy, unspecified, initial encounter 03/28/2020   Anaphylactic shock, unspecified, initial encounter 03/28/2020   Chronic low back pain 03/12/2020   Closed displaced fracture of shaft of fifth metacarpal bone of left hand 04/25/2019   Left hip pain 11/18/2017   GERD (gastroesophageal reflux disease) 10/10/2017   Bloating 10/10/2017   PVD (peripheral vascular disease) ([place])    Phantom pain after amputation of lower extremity ([place]) 01/05/2017   Encounter for removal of sutures 12/01/2016   Abnormal CXR 07/07/2016   Pneumonia, unspecified organism 07/07/2016   Hypotension    NSTEMI (non-ST elevated myocardial infarction) ([place])    Encounter for immunization 05/27/2016   Nausea and vomiting 03/18/2016   Hypokalemia 01/31/2016   Sepsis ([place]) due to UTI 12/20/2015   Acute lower UTI    Labile blood pressure    SIRS (systemic inflammatory response syndrome) ([place])    Hypoglycemia associated with diabetes ([place])    Urinary retention    Dysphagia    S/P bilateral BKA (below knee amputation) ([place])    Abnormality of gait    Muscle spasm    ESRD on dialysis ([place])    Type 2 diabetes mellitus with diabetic peripheral angiopathy and gangrene, without long-term current use of insulin (HCC)    Post-operative pain    Metabolic bone disease    Hypoxia    Type 2 diabetes mellitus with peripheral neuropathy (HCC)    Labile blood glucose    Postoperative pain    Acute blood loss anemia    Anemia of chronic disease    Below knee amputation status 11/29/2015   Odynophagia 11/12/2015   Unspecified disorder of calcium metabolism 07/31/2015   Chest pain, atypical 06/06/2015    Ingrown nail 05/28/2015   Other fluid overload 02/15/2015   Shortness of breath 12/13/2014   Diarrhea, unspecified 12/13/2014   Pain, unspecified 12/13/2014   Pruritus, unspecified 12/13/2014   Unspecified protein-calorie malnutrition ([place]) 10/17/2014   Acute pulmonary edema (HCC)    FUO (fever of unknown origin)    Complication of vascular dialysis catheter 09/10/2014   Fever, unspecified 09/10/2014   HCAP (healthcare-associated pneumonia)    Coagulation defect, unspecified ([place]) 09/06/2014   Radiculopathy, cervical region 09/06/2014   End stage renal disease ([place]) 09/06/2014   Essential (primary) hypertension 09/06/2014   Hyperlipidemia, unspecified 09/06/2014   Encounter for screening for respiratory tuberculosis 09/06/2014   Secondary hyperparathyroidism of renal origin ([place]) 09/06/2014   Anemia in chronic kidney disease 09/06/2014   Type 2 diabetes mellitus with ESRD (end-stage renal disease) ([place]) 09/01/2014   Anemia of renal disease 09/01/2014   Venous insufficiency of left leg 08/27/2014   Rash 07/06/2014   Diabetic wet gangrene of the foot - Right 03/15/2014   Critical lower limb ischemia ([place]) 01/09/2014   Right foot ulcer ([place]) 11/07/2013   Toe pain 09/06/2013   Pre-ulcerative corn or callous 09/06/2013   Lower extremity pain 09/06/2013   Low back pain 12/08/2011   Hip bursitis, left 12/08/2011   Left leg pain 12/08/2011   Abdominal pain 07/08/2011   Encounter for well adult exam with abnormal findings 12/08/2010   Allergic  rhinitis, cause unspecified 12/08/2010   Type 2 diabetes mellitus with diabetic chronic kidney disease ([place]) 05/21/2009   SECONDARY HYPERPARATHYROIDISM 05/21/2009   NUMBNESS 05/21/2009   BACK PAIN 05/02/2009   ANEMIA-IRON DEFICIENCY 01/25/2008   Essential hypertension 01/25/2008   Brachial neuritis or radiculitis 01/25/2008   Hyperlipidemia 03/30/2007   PCP:  Biagio Borg, MD Pharmacy:   [id number] [id number]: [phone]: [phone]    Social Determinants of Health (SDOH) Interventions    Readmission Risk Interventions No flowsheet data found.

## 2021-06-27 NOTE — Care Management Obs Status (Signed)
MEDICARE OBSERVATION STATUS NOTIFICATION   Patient Details  Name: Carolyn Valenzuela MRN: [id number] Date of Birth: 1950/10/23   Medicare Observation Status Notification Given:  Yes    Joanne Chars, LCSW 06/27/2021, 8:54 AM

## 2021-06-27 NOTE — Progress Notes (Signed)
Patient ID: KAILENE STEINHART, female   DOB: Jun 29, 1951, 70 y.o.   MRN: [id number] S: No events overnight.  HIDA scan negative. O:BP (!) 150/96 (BP Location: Right Arm)   Pulse 65   Temp 98 F (36.7 C) (Oral)   Resp 20   Ht 5' (1.524 m)   Wt 51.7 kg   SpO2 99%   BMI 22.26 kg/m   Intake/Output Summary (Last 24 hours) at 06/27/2021 0928 Last data filed at 06/27/2021 0655 Gross per 24 hour  Intake 120 ml  Output 1120 ml  Net -1000 ml   Intake/Output: I/O last 3 completed shifts: In: 120 [P.O.:120] Out: 1120 [Urine:120; Other:1000]  Intake/Output this shift:  No intake/output data recorded. Weight change: -4.5 kg Gen: NAD CVS: RRR Resp: CTA Abd: +BS, soft, mild RUQ tenderness Ext: s/p bilateral BKA's, LUE AVF +T/B  Recent Labs  Lab 06/25/21 0835 06/25/21 1659 06/26/21 0335 06/27/21 0415  NA 136 134* 132* 136  K 3.3* 3.2* 3.5 3.7  CL 92* 93* 93* 98  CO2 30 28 25 26   GLUCOSE 77 77 50* 93  BUN 8 11 12  7*  CREATININE 4.84* 5.34* 5.72* 3.66*  ALBUMIN 2.4* 2.2* 2.2*  --   CALCIUM 9.9 9.6 9.6 9.3  PHOS  --   --  4.0  --   AST 91* 86* 76*  --   ALT 32 33 31  --    Liver Function Tests: Recent Labs  Lab 06/25/21 0835 06/25/21 1659 06/26/21 0335  AST 91* 86* 76*  ALT 32 33 31  ALKPHOS 124 126 111  BILITOT 1.2 1.1 0.9  PROT 5.4* 5.2* 5.1*  ALBUMIN 2.4* 2.2* 2.2*   Recent Labs  Lab 06/25/21 0835 06/25/21 1659  LIPASE 31 29   No results for input(s): AMMONIA in the last 168 hours. CBC: Recent Labs  Lab 06/25/21 0835 06/26/21 0335 06/27/21 0020  WBC 5.3 5.4 6.5  NEUTROABS 3.0 2.6  --   HGB 11.2* 10.9* 11.0*  HCT 33.4* 31.8* 32.6*  MCV 90.5 90.1 89.6  PLT 95* 71* 102*   Cardiac Enzymes: No results for input(s): CKTOTAL, CKMB, CKMBINDEX, TROPONINI in the last 168 hours. CBG: Recent Labs  Lab 06/26/21 1120 06/26/21 1940 06/26/21 2317 06/27/21 0420 06/27/21 0727  GLUCAP 94 65* 87 87 89    Iron Studies: No results for input(s): IRON, TIBC,  TRANSFERRIN, FERRITIN in the last 72 hours. Studies/Results: CT Chest Wo Contrast  Result Date: 06/25/2021 CLINICAL DATA:  Pneumonia.  Weakness. EXAM: CT CHEST WITHOUT CONTRAST TECHNIQUE: Multidetector CT imaging of the chest was performed following the standard protocol without IV contrast. COMPARISON:  CT abdomen and pelvis 06/25/2021. Chest x-ray 06/25/2021. FINDINGS: Cardiovascular: The heart is mildly enlarged. Aorta is normal in size. There are atherosclerotic calcifications of the aorta and coronary arteries. There is no pericardial effusion. Left axillary vascular stent is partially visualized. Mediastinum/Nodes: There are nonenlarged paratracheal and prevascular lymph nodes. There is a mildly enlarged subcarinal lymph node measuring 11 mm short axis. Visualized thyroid gland and esophagus are within normal limits. Lungs/Pleura: There is a small amount of atelectasis in the bilateral lower lobes and lingula. There is a calcified granuloma in the right lower lobe. There is a small right pleural effusion and trace left pleural effusion. There is no pneumothorax. Trachea and central airways are patent. Upper Abdomen: Gallstones are present. There are hypodensities in the kidneys which are too small to characterize, likely cysts. Musculoskeletal: There is diffuse body wall edema. Calcifications  are seen in the bilateral breasts. There is skin thickening of the anterior and posterior chest wall. There are severe/end-stage degenerative changes of the right shoulder. No acute fractures are seen. IMPRESSION: 1. Small bilateral pleural effusions 2. Bilateral lower lobe and lingular atelectasis. 3. Cardiomegaly. 4. Mildly enlarged subcarinal lymph node. 5. Diffuse body wall edema. 6. Posterior and anterior chest wall skin thickening. Correlate for infection. 7. Cholelithiasis. Aortic Atherosclerosis (ICD10-I70.0). Electronically Signed   By: Ronney Asters M.D.   On: 06/25/2021 15:14   NM Hepatobiliary Liver  Func  Result Date: 06/26/2021 CLINICAL DATA:  Abdominal pain, upper, chronic, assess gallbladder motility EXAM: NUCLEAR MEDICINE HEPATOBILIARY IMAGING TECHNIQUE: Sequential images of the abdomen were obtained out to 60 minutes following intravenous administration of radiopharmaceutical. RADIOPHARMACEUTICALS:  5.2 mCi Tc-88m  Choletec IV COMPARISON:  Ultrasound and CT 06/25/2021 FINDINGS: Prompt uptake and biliary excretion of activity by the liver is seen. Gallbladder activity is visualized, consistent with patency of cystic duct. Biliary activity passes into small bowel, consistent with patent common bile duct. IMPRESSION: Negative hepatobiliary scan. No scintigraphic evidence of acute cholecystitis. Electronically Signed   By: Davina Poke D.O.   On: 06/26/2021 15:40   CT Abdomen Pelvis W Contrast  Result Date: 06/25/2021 CLINICAL DATA:  Diffuse abdominal pain infection suspected. EXAM: CT ABDOMEN AND PELVIS WITH CONTRAST TECHNIQUE: Multidetector CT imaging of the abdomen and pelvis was performed using the standard protocol following bolus administration of intravenous contrast. CONTRAST:  159mL OMNIPAQUE IOHEXOL 300 MG/ML  SOLN COMPARISON:  July 09, 2011 CT and March 12, 2020 lumbar spine radiograph. FINDINGS: Lower chest: Small right greater than left pleural effusions with adjacent atelectasis. Hepatobiliary: No suspicious hepatic lesion. Cholelithiasis in a distended gallbladder. No biliary ductal dilation. Pancreas: No pancreatic ductal dilation or evidence of acute inflammation Spleen: Within normal limits. Adrenals/Urinary Tract: Bilateral adrenal glands are unremarkable. No hydronephrosis. Atrophic kidneys. Hypodense 1.2 cm left renal cyst. Subcentimeter hypodense bilateral renal lesions technically too small to accurately characterize but statistically likely represent cysts. Urinary bladder is decompressed. Stomach/Bowel: Stomach is unremarkable for degree of distension. No pathologic  dilation of small or large bowel. The appendix and terminal ileum appear normal. Sigmoid colonic diverticulosis without findings of acute diverticulitis. Vascular/Lymphatic: Aortic and branch vessel atherosclerosis without abdominal aortic aneurysm. No pathologically enlarged abdominal or pelvic lymph nodes. Reproductive: Calcified uterine leiomyomas. No suspicious adnexal mass. Other: No significant abdominopelvic free fluid. Diffuse subcutaneous edema. Musculoskeletal: Vacuum disc artifact with endplate sclerosis and remodeling at L4-L5 similar to prior radiograph March 12, 2020. L5-S1 discogenic disease. IMPRESSION: 1. Cholelithiasis in a distended gallbladder, without other CT findings of cholecystitis. Consider further evaluation with right upper quadrant ultrasound if clinically indicated. 2. Small right greater than left pleural effusions with adjacent atelectasis. 3. Sigmoid colonic diverticulosis without findings of acute diverticulitis. 4. Aortic Atherosclerosis (ICD10-I70.0). Electronically Signed   By: Dahlia Bailiff M.D.   On: 06/25/2021 13:39   US Abdomen Limited  Result Date: 06/25/2021 CLINICAL DATA:  CT shows gallstones and distended gallbladder EXAM: ULTRASOUND ABDOMEN LIMITED RIGHT UPPER QUADRANT COMPARISON:  CT June 25, 2021. FINDINGS: Gallbladder: Multiple gallstones, measuring up to 1.6 cm. Layering sludge within the gallbladder. No sonographic Murphy sign per technologist. Gallbladder wall thickening, measuring 3.9 mm. Common bile duct: Diameter: 6.7 mm, dilated. Liver:Heterogeneous echotexture, widened fissures and lobular cortex. No focal lesion identified. Portal vein is patent on color Doppler imaging with normal direction of blood flow towards the liver. Other: Echogenic partially imaged right kidney IMPRESSION: 1.  Distended gallbladder with gallbladder wall thickening and multiple large gallstones and sludge. No sonographic Murphy sign per the technologist. Findings are  indeterminate for acute cholecystitis. HIDA scan could further evaluate if clinically uncertain. 2. Dilated common bile duct, measuring up to 6.7 mm. Recommend correlation with liver functions to exclude obstruction. MRCP could further characterize if clinically indicated. 3. Heterogeneous echotexture of the liver with widened fissures and lobular cortex of the liver. Question liver disease / developing cirrhosis. Recommend clinical correlation. 4. Echogenic partially imaged right kidney, possibly related to chronic kidney disease. Electronically Signed   By: Margaretha Sheffield M.D.   On: 06/25/2021 16:52   DG Chest Port 1 View  Result Date: 06/25/2021 CLINICAL DATA:  Weakness diarrhea dialysis patient EXAM: PORTABLE CHEST 1 VIEW COMPARISON:  03/12/2020. FINDINGS: Enlarged cardiac silhouette. Streaky left basilar opacities. No visible pneumothorax. Probable small left pleural effusion. Severe right shoulder degenerative change. IMPRESSION: 1. Streaky left basilar opacities, favor atelectasis. Infection or aspiration is not excluded. 2. Probable small left pleural effusion. 3. Cardiomegaly. Electronically Signed   By: Margaretha Sheffield M.D.   On: 06/25/2021 13:08    amLODipine  10 mg Oral Daily   brimonidine  1 drop Both Eyes TID   Chlorhexidine Gluconate Cloth  6 each Topical Q0600   cinacalcet  30 mg Oral Q M,W,F-1800   cycloSPORINE  1 drop Both Eyes BID   heparin injection (subcutaneous)  5,000 Units Subcutaneous Q8H   insulin aspart  0-6 Units Subcutaneous Q4H   nebivolol  5 mg Oral Daily   pantoprazole  40 mg Oral Daily   rosuvastatin  40 mg Oral Daily   sodium chloride flush  3 mL Intravenous Q12H   sucroferric oxyhydroxide  500 mg Oral BID    BMET    Component Value Date/Time   NA 136 06/27/2021 0415   K 3.7 06/27/2021 0415   CL 98 06/27/2021 0415   CO2 26 06/27/2021 0415   GLUCOSE 93 06/27/2021 0415   BUN 7 (L) 06/27/2021 0415   CREATININE 3.66 (H) 06/27/2021 0415   CREATININE  6.64 (H) 03/12/2020 1058   CALCIUM 9.3 06/27/2021 0415   CALCIUM 7.2 (L) 08/07/2014 1406   GFRNONAA 13 (L) 06/27/2021 0415   GFRAA 5 (L) 10/10/2019 1851   CBC    Component Value Date/Time   WBC 6.5 06/27/2021 0020   RBC 3.64 (L) 06/27/2021 0020   HGB 11.0 (L) 06/27/2021 0020   HCT 32.6 (L) 06/27/2021 0020   PLT 102 (L) 06/27/2021 0020   MCV 89.6 06/27/2021 0020   MCH 30.2 06/27/2021 0020   MCHC 33.7 06/27/2021 0020   RDW 19.1 (H) 06/27/2021 0020   LYMPHSABS 2.1 06/26/2021 0335   MONOABS 0.6 06/26/2021 0335   EOSABS 0.1 06/26/2021 0335   BASOSABS 0.1 06/26/2021 0335    Dialysis Orders:  Center: [facility]  on MWF. 160NRe, 4 hours, BFR 400, DFR 500, EDW 54.3kg, 3K, 2Ca, AVF 15g, heparin 2500 unit bolus Mircera 37mcg IV q 2 weeks- last dose 06/23/21 Hectorol 41mcg IV q HD Sensipar 30mg  2 times per week Velphoro 500mg  2 tabs PO TID with meals   Assessment/Plan:  Cholelithiasis/diarrhea: HIDA scan negative.   C diff stool panel pending, supportive therapy per primary team.  ESRD:  MWF, missed last HD due to ED visit. No volume overload on exam, K+ controlled. Will plan short HD yesterday.  Volume and electrolytes stable.  Will plan for HD tomorrow, then resume outpatient schedule on Monday.  If she  is to be discharged today will plan for another short session of HD today.  Last AF down from baseline slightly, planned for fistulogram outpatient but can try to arrange for fistulogram here if any issues with HD.  Hypertension/volume: BP overall well controlled. She did have some dizziness with standing this AM. Does not appear volume overloaded and has minimal PO intake lately, UFG 1L with HD as tolerated.   Anemia: Hemoglobin at goal, ESA recently dosed. No bleeding reported. Continue to trend Hgb.   Metabolic bone disease: Corrected calcium is high, will hold hectorol for now. Continue sensipar. Phos controlled, continue binders when tolerating PO  Nutrition:  Currently NPO  Donetta Potts, MD [facility] [phone]

## 2021-06-27 NOTE — Care Management CC44 (Signed)
Condition Code 44 Documentation Completed  Patient Details  Name: Carolyn Valenzuela MRN: [id number] Date of Birth: 04/27/1951   Condition Code 44 given:  Yes Patient signature on Condition Code 44 notice:  Yes Documentation of 2 MD's agreement:  Yes Code 44 added to claim:  Yes    Joanne Chars, LCSW 06/27/2021, 8:54 AM

## 2021-06-27 NOTE — Progress Notes (Signed)
Pt receives out-pt HD at [facility] on MWF. Pt arrives at 6:10 for 6:25 chair time. Will assist as needed.   Melven Sartorius Renal Navigator [phone]

## 2021-06-28 DIAGNOSIS — I5032 Chronic diastolic (congestive) heart failure: Secondary | ICD-10-CM | POA: Diagnosis not present

## 2021-06-28 DIAGNOSIS — N186 End stage renal disease: Secondary | ICD-10-CM | POA: Diagnosis not present

## 2021-06-28 DIAGNOSIS — R7989 Other specified abnormal findings of blood chemistry: Secondary | ICD-10-CM

## 2021-06-28 DIAGNOSIS — K8020 Calculus of gallbladder without cholecystitis without obstruction: Secondary | ICD-10-CM | POA: Diagnosis not present

## 2021-06-28 LAB — GLUCOSE, CAPILLARY
Glucose-Capillary: 139 mg/dL — ABNORMAL HIGH (ref 70–99)
Glucose-Capillary: 96 mg/dL (ref 70–99)
Glucose-Capillary: 97 mg/dL (ref 70–99)

## 2021-06-28 LAB — RENAL FUNCTION PANEL
Albumin: 2.5 g/dL — ABNORMAL LOW (ref 3.5–5.0)
Anion gap: 15 (ref 5–15)
BUN: 12 mg/dL (ref 8–23)
CO2: 23 mmol/L (ref 22–32)
Calcium: 9.8 mg/dL (ref 8.9–10.3)
Chloride: 98 mmol/L (ref 98–111)
Creatinine, Ser: 4.81 mg/dL — ABNORMAL HIGH (ref 0.44–1.00)
GFR, Estimated: 9 mL/min — ABNORMAL LOW (ref >60)
Glucose, Bld: 94 mg/dL (ref 70–99)
Phosphorus: 2.9 mg/dL (ref 2.5–4.6)
Potassium: 5.5 mmol/L — ABNORMAL HIGH (ref 3.5–5.1)
Sodium: 136 mmol/L (ref 135–145)

## 2021-06-28 LAB — GASTROINTESTINAL PANEL BY PCR, STOOL (REPLACES STOOL CULTURE)

## 2021-06-28 LAB — MAGNESIUM: Magnesium: 1.9 mg/dL (ref 1.7–2.4)

## 2021-06-28 LAB — C DIFFICILE QUICK SCREEN W PCR REFLEX
C Diff antigen: NEGATIVE
C Diff interpretation: NOT DETECTED
C Diff toxin: NEGATIVE

## 2021-06-28 LAB — C-REACTIVE PROTEIN: CRP: 4.8 mg/dL — ABNORMAL HIGH (ref <1.0)

## 2021-06-28 LAB — CK: Total CK: 860 U/L — ABNORMAL HIGH (ref 38–234)

## 2021-06-28 LAB — LIPASE, BLOOD: Lipase: 23 U/L (ref 11–51)

## 2021-06-28 LAB — SEDIMENTATION RATE: Sed Rate: 2 mm/hr (ref 0–22)

## 2021-06-28 LAB — BRAIN NATRIURETIC PEPTIDE: B Natriuretic Peptide: >4500 pg/mL — ABNORMAL HIGH (ref 0.0–100.0)

## 2021-06-28 MED ORDER — ASPIRIN 81 MG PO CHEW
81.0000 mg | CHEWABLE_TABLET | Freq: Every day | ORAL | Status: DC
  Administered 2021-06-28 – 2021-07-01 (×4): 81 mg via ORAL
  Filled 2021-06-28 (×4): qty 1

## 2021-06-28 MED ORDER — RENA-VITE PO TABS
1.0000 | ORAL_TABLET | Freq: Every day | ORAL | Status: DC
  Administered 2021-06-28 – 2021-07-01 (×4): 1 via ORAL
  Filled 2021-06-28 (×4): qty 1

## 2021-06-28 MED ORDER — CLOPIDOGREL BISULFATE 75 MG PO TABS
75.0000 mg | ORAL_TABLET | Freq: Every day | ORAL | Status: DC
  Administered 2021-06-28 – 2021-07-01 (×4): 75 mg via ORAL
  Filled 2021-06-28 (×4): qty 1

## 2021-06-28 MED ORDER — GABAPENTIN 100 MG PO CAPS
100.0000 mg | ORAL_CAPSULE | Freq: Every day | ORAL | Status: DC
  Administered 2021-06-28 – 2021-07-01 (×4): 100 mg via ORAL
  Filled 2021-06-28 (×4): qty 1

## 2021-06-28 NOTE — Procedures (Signed)
I was present at this dialysis session. I have reviewed the session itself and made appropriate changes.   Vital signs in last 24 hours:  Temp:  [97.7 F (36.5 C)-98.3 F (36.8 C)] 98.3 F (36.8 C) (10/29 0847) Pulse Rate:  [63-66] 66 (10/29 0847) Resp:  [14-17] 17 (10/29 0847) BP: (129-148)/(42-68) 129/68 (10/29 0847) SpO2:  [98 %-100 %] 99 % (10/29 0847) Weight:  [53 kg] 53 kg (10/29 0847) Weight change: -1.5 kg Filed Weights   06/27/21 0429 06/28/21 0035 06/28/21 0847  Weight: 51.7 kg 53 kg 53 kg    Recent Labs  Lab 06/28/21 0433  NA 136  K 5.5*  CL 98  CO2 23  GLUCOSE 94  BUN 12  CREATININE 4.81*  CALCIUM 9.8  PHOS 2.9    Recent Labs  Lab 06/25/21 0835 06/26/21 0335 06/27/21 0020  WBC 5.3 5.4 6.5  NEUTROABS 3.0 2.6  --   HGB 11.2* 10.9* 11.0*  HCT 33.4* 31.8* 32.6*  MCV 90.5 90.1 89.6  PLT 95* 71* 102*    Scheduled Meds:  amLODipine  10 mg Oral Daily   brimonidine  1 drop Both Eyes TID   Chlorhexidine Gluconate Cloth  6 each Topical Q0600   cinacalcet  30 mg Oral Q M,W,F-1800   cycloSPORINE  1 drop Both Eyes BID   heparin injection (subcutaneous)  5,000 Units Subcutaneous Q8H   nebivolol  5 mg Oral Daily   pantoprazole  40 mg Oral Daily   psyllium  1 packet Oral Daily   sodium chloride flush  3 mL Intravenous Q12H   sucroferric oxyhydroxide  500 mg Oral BID   Continuous Infusions:  sodium chloride     PRN Meds:.sodium chloride, acetaminophen **OR** acetaminophen, albuterol, fentaNYL (SUBLIMAZE) injection, HYDROcodone-acetaminophen, loperamide, ondansetron **OR** ondansetron (ZOFRAN) IV, sodium chloride flush    Dialysis Orders:  Center: [facility]  on MWF. 160NRe, 4 hours, BFR 400, DFR 500, EDW 54.3kg, 3K, 2Ca, AVF 15g, heparin 2500 unit bolus Mircera 1mcg IV q 2 weeks- last dose 06/23/21 Hectorol 38mcg IV q HD Sensipar 30mg  2 times per week Velphoro 500mg  2 tabs PO TID with meals   Assessment/Plan:  Cholelithiasis/diarrhea: HIDA scan negative.    C diff stool panel pending, supportive therapy per primary team.  ESRD:  MWF, missed last HD due to ED visit. No volume overload on exam, K+ controlled. Will plan short HD yesterday.  Volume and electrolytes stable.  Will plan for HD today, then resume outpatient schedule on Monday.  Last AF down from baseline slightly, planned for fistulogram outpatient but can try to arrange for fistulogram here if any issues with HD.  Hypertension/volume: BP overall well controlled. She did have some dizziness with standing this AM. Does not appear volume overloaded and has minimal PO intake lately, UFG 1L with HD as tolerated.   Anemia: Hemoglobin at goal, ESA recently dosed. No bleeding reported. Continue to trend Hgb.   Metabolic bone disease: Corrected calcium is high, will hold hectorol for now. Continue sensipar. Phos controlled, continue binders when tolerating PO  Nutrition:  Currently NPO   Donetta Potts,  MD 06/28/2021, 8:55 AM

## 2021-06-28 NOTE — Plan of Care (Signed)

## 2021-06-28 NOTE — Progress Notes (Signed)
   06/28/21 1235  Vitals  Temp 98 F (36.7 C)  Temp Source Oral  BP (!) 135/55  BP Location Right Arm  BP Method Automatic  Patient Position (if appropriate) Lying  Pulse Rate 69  Pulse Rate Source Monitor  Resp 18  Oxygen Therapy  SpO2 100 %  O2 Device Room Air  Dialysis Weight  Weight 50.9 kg  Type of Weight Post-Dialysis  Post-Hemodialysis Assessment  Rinseback Volume (mL) 250 mL  KECN 253 V  Dialyzer Clearance Lightly streaked  Duration of HD Treatment -hour(s) 3.5 hour(s)  Hemodialysis Intake (mL) 500 mL  UF Total -Machine (mL) 1500 mL  Net UF (mL) 1000 mL  Tolerated HD Treatment Yes  Post-Hemodialysis Comments tx completed-pt stable  AVG/AVF Arterial Site Held (minutes) 5 minutes  AVG/AVF Venous Site Held (minutes) 5 minutes  Fistula / Graft Left Upper arm Arteriovenous fistula  Placement Date/Time: 09/06/14 1220   Orientation: Left  Access Location: Upper arm  Access Type: (c) Arteriovenous fistula  Site Condition No complications  Fistula / Graft Assessment Present;Thrill;Bruit  Status Deaccessed  Drainage Description None  HD tx complete-pt stable.

## 2021-06-28 NOTE — Progress Notes (Addendum)
PROGRESS NOTE  Carolyn Valenzuela QQI:[id number] DOB: March 22, 1951   PCP: Biagio Borg, MD  Patient is from: Home.  Lives alone.  DOA: 06/25/2021 LOS: 1  Chief complaints:  Chief Complaint  Patient presents with   Diarrhea   Weakness     Brief Narrative / Interim history: 70 year old F with PMH of ESRD on HD MWF, DM-2, PAD/bilateral BKA, CAD, HTN and dyslipidemia presenting with abdominal pain, diarrhea and suprapubic discomfort, and admitted with cholelithiasis to rule out cholecystitis.  Vitals and blood work in ED without significant finding.  CT abdomen and pelvis with cholelithiasis and distended gallbladder but no cholecystitis or biliary ductal dilation.  CT chest with small bilateral pleural effusions.  RUQ ultrasound showed distended gallbladder with gallbladder wall thickening and multiple large gallstones and sludge but no sonographic Murphy sign.  CBD 6.7 mm.  HIDA scan negative for acute cholecystitis.  C. difficile and GIP panel ordered but has not been collected yet.  Patient's diarrhea and abdominal pain seems to have resolved.  C. difficile and GI panel negative.  However, she is somewhat confused and debilitated.  Therapy recommends SNF.  Subjective: Seen and examined earlier this morning while on dialysis.  No major events overnight or this morning.  She is sleepy but wakes to voice.  She says she feels well and ready to go home.  She is oriented to self and place but somewhat confused.   Objective: Vitals:   06/28/21 1130 06/28/21 1200 06/28/21 1230 06/28/21 1235  BP: (!) 126/41 (!) 134/54 (!) 132/59 (!) 135/55  Pulse: 67 67 66 69  Resp: (!) 21 15 17 18   Temp:    98 F (36.7 C)  TempSrc:    Oral  SpO2:    100%  Weight:    50.9 kg  Height:        Intake/Output Summary (Last 24 hours) at 06/28/2021 1733 Last data filed at 06/28/2021 1235 Gross per 24 hour  Intake --  Output 1000 ml  Net -1000 ml   Filed Weights   06/28/21 0035 06/28/21 0847 06/28/21 1235   Weight: 53 kg 53 kg 50.9 kg    Examination:  GENERAL: Frail looking elderly female.  No apparent distress. HEENT: MMM.  Vision and hearing grossly intact.  NECK: Supple.  No apparent JVD.  RESP: 100% on RA.  No IWOB.  Fair aeration bilaterally. CVS:  RRR. Heart sounds normal.  ABD/GI/GU: BS+. Abd soft, NTND.  MSK/EXT:  Moves extremities.  Bilateral BKA. SKIN: no apparent skin lesion or wound NEURO: Sleepy but wakes to voice.  Oriented to self and place but appears confused.  No apparent focal neuro deficit. PSYCH: Calm. Normal affect.   Procedures:  None  Microbiology summarized: HERDE-08 and influenza PCR nonreactive. C. difficile and GIP panel negative.  Assessment & Plan: Abdominal pain/tenderness/diarrhea-suspect functional diarrhea and possible biliary colic from cholelithiasis.  CT and RUQ [place] with gallbladder wall thickening and sludge but HIDA scan negative for cholecystitis.  Abdominal exam benign.  C. difficile and GIP negative.  Symptoms seems to have resolved. -Continue Imodium and psyllium  Cholelithiasis without cholecystitis-CT, RUQ [place] and HIDA as above. -Outpatient follow-up with general surgery  Acute metabolic encephalopathy: patient is somewhat confused this morning.  No focal neurodeficit to suggest CVA.  Low suspicion for infectious process.  Did not receive sedating medication.  Could be delirium. -Reorientation and delirium precaution. -Avoid sedating medications   ESRD on HD (M-W-F): HD off schedule on 10/27 and 10/29.   -  Back on schedule on Monday.  Hypokalemia/hyponatremia: Resolved.    Anemia of renal disease: Stable. Recent Labs    02/13/21 1115 06/25/21 0835 06/26/21 0335 06/27/21 0020  HGB 10.0* 11.2* 10.9* 11.0*  -Per nephrology  CAD/PVD/bilateral BKA-uses prosthetic legs -Decrease Crestor to ESRD dose -Continue aspirin -PT/OT eval  Chronic diastolic CHF: TTE 10/7251 with LVEF of 65 to 66%, diastolic dysfunction.  She appears  euvolemic on exam.  No cardiopulmonary symptoms.  BNP>> 4500 (no recent value for comparison).  -Fluid management by dialysis -Would consider limited TTE if symptomatic.  Essential HTN BP slightly elevated this morning but normotensive for most part.   -Continue blood pressure control with amlodipine and nebivolol.    Controlled DM-2 with ESRD: A1c 5.4%. Recent Labs  Lab 06/27/21 1201 06/27/21 1710 06/27/21 1955 06/28/21 0032 06/28/21 0441  GLUCAP 83 92 123* 96 97  -Discontinue CBG monitoring and SSI   Physical deconditioning/bilateral BKA/fall at home: Patient's sister, Carolyn Valenzuela concerned about patient being at home alone.  Per Carolyn Valenzuela, she slid off the bed recently and was not able to get back on.  -Therapy recommends SNF.  Body mass index is 21.92 kg/m.         DVT prophylaxis:  heparin injection 5,000 Units Start: 06/26/21 1400 SCDs Start: 06/25/21 2235  Code Status: Full code Family Communication: Attempted to call patient's sister, [name] but no answer.  Updated Carolyn Valenzuela, patient's other sister Level of care: Telemetry Medical Status is: Observation  The patient will require care spanning > 2 midnights and should be moved to inpatient because: Acute encephalopathy and lack of safe disposition due to physical deconditioning.  Patient lives alone prior to this hospitalization and cannot return home safely   Consultants:  Nephrology   Sch Meds:  Scheduled Meds:  amLODipine  10 mg Oral Daily   aspirin  81 mg Oral Daily   brimonidine  1 drop Both Eyes TID   Chlorhexidine Gluconate Cloth  6 each Topical Q0600   cinacalcet  30 mg Oral Q M,W,F-1800   clopidogrel  75 mg Oral Daily   cycloSPORINE  1 drop Both Eyes BID   gabapentin  100 mg Oral QHS   heparin injection (subcutaneous)  5,000 Units Subcutaneous Q8H   multivitamin  1 tablet Oral Daily   nebivolol  5 mg Oral Daily   pantoprazole  40 mg Oral Daily   psyllium  1 packet Oral Daily   sodium chloride flush  3 mL  Intravenous Q12H   sucroferric oxyhydroxide  500 mg Oral BID   Continuous Infusions:  sodium chloride     PRN Meds:.sodium chloride, acetaminophen **OR** acetaminophen, albuterol, loperamide, ondansetron **OR** ondansetron (ZOFRAN) IV, sodium chloride flush  Antimicrobials: Anti-infectives (From admission, onward)    Start     Dose/Rate Route Frequency Ordered Stop   06/25/21 1715  cefTRIAXone (ROCEPHIN) 1 g in sodium chloride 0.9 % 100 mL IVPB        1 g 200 mL/hr over 30 Minutes Intravenous  Once 06/25/21 1709 06/25/21 1801        I have personally reviewed the following labs and images: CBC: Recent Labs  Lab 06/25/21 0835 06/26/21 0335 06/27/21 0020  WBC 5.3 5.4 6.5  NEUTROABS 3.0 2.6  --   HGB 11.2* 10.9* 11.0*  HCT 33.4* 31.8* 32.6*  MCV 90.5 90.1 89.6  PLT 95* 71* 102*   BMP &GFR Recent Labs  Lab 06/25/21 0835 06/25/21 1659 06/26/21 0335 06/27/21 0415 06/28/21 0433  NA 136 134*  132* 136 136  K 3.3* 3.2* 3.5 3.7 5.5*  CL 92* 93* 93* 98 98  CO2 30 28 25 26 23   GLUCOSE 77 77 50* 93 94  BUN 8 11 12  7* 12  CREATININE 4.84* 5.34* 5.72* 3.66* 4.81*  CALCIUM 9.9 9.6 9.6 9.3 9.8  MG 1.8  --  1.9  --  1.9  PHOS  --   --  4.0  --  2.9   Estimated Creatinine Clearance: 7.9 mL/min (A) (by C-G formula based on SCr of 4.81 mg/dL (H)). Liver & Pancreas: Recent Labs  Lab 06/25/21 0835 06/25/21 1659 06/26/21 0335 06/28/21 0433  AST 91* 86* 76*  --   ALT 32 33 31  --   ALKPHOS 124 126 111  --   BILITOT 1.2 1.1 0.9  --   PROT 5.4* 5.2* 5.1*  --   ALBUMIN 2.4* 2.2* 2.2* 2.5*   Recent Labs  Lab 06/25/21 0835 06/25/21 1659 06/28/21 0433  LIPASE 31 29 23    No results for input(s): AMMONIA in the last 168 hours. Diabetic: Recent Labs    06/25/21 1852  HGBA1C 5.4   Recent Labs  Lab 06/27/21 1201 06/27/21 1710 06/27/21 1955 06/28/21 0032 06/28/21 0441  GLUCAP 83 92 123* 96 97   Cardiac Enzymes: Recent Labs  Lab 06/28/21 0433  CKTOTAL 860*    No results for input(s): PROBNP in the last 8760 hours. Coagulation Profile: No results for input(s): INR, PROTIME in the last 168 hours. Thyroid Function Tests: Recent Labs    06/26/21 0054  TSH 4.402   Lipid Profile: No results for input(s): CHOL, HDL, LDLCALC, TRIG, CHOLHDL, LDLDIRECT in the last 72 hours. Anemia Panel: No results for input(s): VITAMINB12, FOLATE, FERRITIN, TIBC, IRON, RETICCTPCT in the last 72 hours. Urine analysis:    Component Value Date/Time   COLORURINE YELLOW 09/15/2016 1300   APPEARANCEUR CLEAR 09/15/2016 1300   LABSPEC 1.010 09/15/2016 1300   PHURINE 7.5 09/15/2016 1300   GLUCOSEU 250 (A) 09/15/2016 1300   HGBUR SMALL (A) 09/15/2016 1300   BILIRUBINUR NEGATIVE 09/15/2016 1300   KETONESUR NEGATIVE 09/15/2016 1300   PROTEINUR >300 (A) 03/19/2016 0206   UROBILINOGEN 0.2 09/15/2016 1300   NITRITE NEGATIVE 09/15/2016 1300   LEUKOCYTESUR NEGATIVE 09/15/2016 1300   Sepsis Labs: Invalid input(s): PROCALCITONIN, [place]  Microbiology: Recent Results (from the past 240 hour(s))  Resp Panel by RT-PCR (Flu A&B, Covid) Nasopharyngeal Swab     Status: None   Collection Time: 06/25/21  6:28 PM   Specimen: Nasopharyngeal Swab; Nasopharyngeal(NP) swabs in vial transport medium  Result Value Ref Range Status   SARS Coronavirus 2 by RT PCR NEGATIVE NEGATIVE Final    Comment: (NOTE) SARS-CoV-2 target nucleic acids are NOT DETECTED.  The SARS-CoV-2 RNA is generally detectable in upper respiratory specimens during the acute phase of infection. The lowest concentration of SARS-CoV-2 viral copies this assay can detect is 138 copies/mL. A negative result does not preclude SARS-Cov-2 infection and should not be used as the sole basis for treatment or other patient management decisions. A negative result may occur with  improper specimen collection/handling, submission of specimen other than nasopharyngeal swab, presence of viral mutation(s) within the areas  targeted by this assay, and inadequate number of viral copies(<138 copies/mL). A negative result must be combined with clinical observations, patient history, and epidemiological information. The expected result is Negative.  Fact Sheet for Patients:  EntrepreneurPulse.com.au  Fact Sheet for Healthcare Providers:  IncredibleEmployment.be  This test is no  t yet approved or cleared by the [place] and  has been authorized for detection and/or diagnosis of SARS-CoV-2 by FDA under an Emergency Use Authorization (EUA). This EUA will remain  in effect (meaning this test can be used) for the duration of the COVID-19 declaration under Section 564(b)(1) of the Act, 21 U.S.C.section 360bbb-3(b)(1), unless the authorization is terminated  or revoked sooner.       Influenza A by PCR NEGATIVE NEGATIVE Final   Influenza B by PCR NEGATIVE NEGATIVE Final    Comment: (NOTE) The Xpert Xpress SARS-CoV-2/FLU/RSV plus assay is intended as an aid in the diagnosis of influenza from Nasopharyngeal swab specimens and should not be used as a sole basis for treatment. Nasal washings and aspirates are unacceptable for Xpert Xpress SARS-CoV-2/FLU/RSV testing.  Fact Sheet for Patients: EntrepreneurPulse.com.au  Fact Sheet for Healthcare Providers: IncredibleEmployment.be  This test is not yet approved or cleared by the [place] FDA and has been authorized for detection and/or diagnosis of SARS-CoV-2 by FDA under an Emergency Use Authorization (EUA). This EUA will remain in effect (meaning this test can be used) for the duration of the COVID-19 declaration under Section 564(b)(1) of the Act, 21 U.S.C. section 360bbb-3(b)(1), unless the authorization is terminated or revoked.  Performed at [facility], [place] [address] [place], [place] [address]   C Difficile Quick Screen w PCR reflex     Status: None    Collection Time: 06/27/21 10:03 PM   Specimen: STOOL  Result Value Ref Range Status   C Diff antigen NEGATIVE NEGATIVE Final   C Diff toxin NEGATIVE NEGATIVE Final   C Diff interpretation No C. difficile detected.  Final    Comment: Performed at [facility], [place] [address] [place], [place] [address]  Gastrointestinal Panel by PCR , Stool     Status: None   Collection Time: 06/27/21 10:03 PM   Specimen: STOOL  Result Value Ref Range Status   Campylobacter species NOT DETECTED NOT DETECTED Final   Plesimonas shigelloides NOT DETECTED NOT DETECTED Final   Salmonella species NOT DETECTED NOT DETECTED Final   Yersinia enterocolitica NOT DETECTED NOT DETECTED Final   Vibrio species NOT DETECTED NOT DETECTED Final   Vibrio cholerae NOT DETECTED NOT DETECTED Final   Enteroaggregative E coli (EAEC) NOT DETECTED NOT DETECTED Final   Enteropathogenic E coli (EPEC) NOT DETECTED NOT DETECTED Final   Enterotoxigenic E coli (ETEC) NOT DETECTED NOT DETECTED Final   Shiga like toxin producing E coli (STEC) NOT DETECTED NOT DETECTED Final   Shigella/Enteroinvasive E coli (EIEC) NOT DETECTED NOT DETECTED Final   Cryptosporidium NOT DETECTED NOT DETECTED Final   Cyclospora cayetanensis NOT DETECTED NOT DETECTED Final   Entamoeba histolytica NOT DETECTED NOT DETECTED Final   Giardia lamblia NOT DETECTED NOT DETECTED Final   Adenovirus F40/41 NOT DETECTED NOT DETECTED Final   Astrovirus NOT DETECTED NOT DETECTED Final   Norovirus GI/GII NOT DETECTED NOT DETECTED Final   Rotavirus A NOT DETECTED NOT DETECTED Final   Sapovirus (I, II, IV, and V) NOT DETECTED NOT DETECTED Final    Comment: Performed at [facility], [address] [place], [place] [address]    Radiology Studies: No results found.    Shadd Dunstan T. [place]  If 7PM-7AM, please contact night-coverage [email or web address] 06/28/2021, 5:33 PM

## 2021-06-29 DIAGNOSIS — I132 Hypertensive heart and chronic kidney disease with heart failure and with stage 5 chronic kidney disease, or end stage renal disease: Secondary | ICD-10-CM | POA: Diagnosis present

## 2021-06-29 DIAGNOSIS — Z992 Dependence on renal dialysis: Secondary | ICD-10-CM | POA: Diagnosis not present

## 2021-06-29 DIAGNOSIS — K8020 Calculus of gallbladder without cholecystitis without obstruction: Secondary | ICD-10-CM | POA: Diagnosis present

## 2021-06-29 DIAGNOSIS — Z7984 Long term (current) use of oral hypoglycemic drugs: Secondary | ICD-10-CM | POA: Diagnosis not present

## 2021-06-29 DIAGNOSIS — D638 Anemia in other chronic diseases classified elsewhere: Secondary | ICD-10-CM | POA: Diagnosis not present

## 2021-06-29 DIAGNOSIS — I080 Rheumatic disorders of both mitral and aortic valves: Secondary | ICD-10-CM | POA: Diagnosis present

## 2021-06-29 DIAGNOSIS — E1122 Type 2 diabetes mellitus with diabetic chronic kidney disease: Secondary | ICD-10-CM | POA: Diagnosis present

## 2021-06-29 DIAGNOSIS — R410 Disorientation, unspecified: Secondary | ICD-10-CM | POA: Diagnosis present

## 2021-06-29 DIAGNOSIS — R5381 Other malaise: Secondary | ICD-10-CM | POA: Diagnosis present

## 2021-06-29 DIAGNOSIS — I252 Old myocardial infarction: Secondary | ICD-10-CM | POA: Diagnosis not present

## 2021-06-29 DIAGNOSIS — K591 Functional diarrhea: Secondary | ICD-10-CM | POA: Diagnosis present

## 2021-06-29 DIAGNOSIS — E876 Hypokalemia: Secondary | ICD-10-CM | POA: Diagnosis present

## 2021-06-29 DIAGNOSIS — G9341 Metabolic encephalopathy: Secondary | ICD-10-CM | POA: Diagnosis present

## 2021-06-29 DIAGNOSIS — M898X9 Other specified disorders of bone, unspecified site: Secondary | ICD-10-CM | POA: Diagnosis present

## 2021-06-29 DIAGNOSIS — K8050 Calculus of bile duct without cholangitis or cholecystitis without obstruction: Secondary | ICD-10-CM | POA: Diagnosis present

## 2021-06-29 DIAGNOSIS — M6282 Rhabdomyolysis: Secondary | ICD-10-CM | POA: Diagnosis not present

## 2021-06-29 DIAGNOSIS — I5042 Chronic combined systolic (congestive) and diastolic (congestive) heart failure: Secondary | ICD-10-CM | POA: Diagnosis present

## 2021-06-29 DIAGNOSIS — E871 Hypo-osmolality and hyponatremia: Secondary | ICD-10-CM | POA: Diagnosis present

## 2021-06-29 DIAGNOSIS — I255 Ischemic cardiomyopathy: Secondary | ICD-10-CM | POA: Diagnosis present

## 2021-06-29 DIAGNOSIS — I2510 Atherosclerotic heart disease of native coronary artery without angina pectoris: Secondary | ICD-10-CM | POA: Diagnosis present

## 2021-06-29 DIAGNOSIS — N186 End stage renal disease: Secondary | ICD-10-CM | POA: Diagnosis present

## 2021-06-29 DIAGNOSIS — Z20822 Contact with and (suspected) exposure to covid-19: Secondary | ICD-10-CM | POA: Diagnosis present

## 2021-06-29 DIAGNOSIS — R197 Diarrhea, unspecified: Secondary | ICD-10-CM | POA: Diagnosis not present

## 2021-06-29 DIAGNOSIS — D631 Anemia in chronic kidney disease: Secondary | ICD-10-CM | POA: Diagnosis present

## 2021-06-29 DIAGNOSIS — E785 Hyperlipidemia, unspecified: Secondary | ICD-10-CM | POA: Diagnosis present

## 2021-06-29 DIAGNOSIS — E875 Hyperkalemia: Secondary | ICD-10-CM | POA: Diagnosis present

## 2021-06-29 DIAGNOSIS — N2581 Secondary hyperparathyroidism of renal origin: Secondary | ICD-10-CM | POA: Diagnosis present

## 2021-06-29 DIAGNOSIS — E1151 Type 2 diabetes mellitus with diabetic peripheral angiopathy without gangrene: Secondary | ICD-10-CM | POA: Diagnosis present

## 2021-06-29 LAB — RENAL FUNCTION PANEL
Albumin: 2.3 g/dL — ABNORMAL LOW (ref 3.5–5.0)
Anion gap: 11 (ref 5–15)
BUN: 5 mg/dL — ABNORMAL LOW (ref 8–23)
CO2: 25 mmol/L (ref 22–32)
Calcium: 9.3 mg/dL (ref 8.9–10.3)
Chloride: 98 mmol/L (ref 98–111)
Creatinine, Ser: 2.88 mg/dL — ABNORMAL HIGH (ref 0.44–1.00)
GFR, Estimated: 17 mL/min — ABNORMAL LOW (ref >60)
Glucose, Bld: 112 mg/dL — ABNORMAL HIGH (ref 70–99)
Phosphorus: 1.9 mg/dL — ABNORMAL LOW (ref 2.5–4.6)
Potassium: 3.4 mmol/L — ABNORMAL LOW (ref 3.5–5.1)
Sodium: 134 mmol/L — ABNORMAL LOW (ref 135–145)

## 2021-06-29 LAB — BRAIN NATRIURETIC PEPTIDE: B Natriuretic Peptide: >4500 pg/mL — ABNORMAL HIGH (ref 0.0–100.0)

## 2021-06-29 LAB — MAGNESIUM: Magnesium: 1.7 mg/dL (ref 1.7–2.4)

## 2021-06-29 LAB — CK: Total CK: 853 U/L — ABNORMAL HIGH (ref 38–234)

## 2021-06-29 NOTE — TOC Progression Note (Signed)
Transition of Care [facility]) - Progression Note    Patient Details  Name: Carolyn Valenzuela MRN: [id number] Date of Birth: [birth year]  Transition of Care [facility]) CM/SW [place], [place] Phone Number: [phone] 06/29/2021, 12:11 PM  Clinical Narrative:    CSW met with pt, pt's niece Tammy and pt's nephrew Braulio Conte to discuss bed offers. CSW provided the 3 bed offers that have come back, [facility], [facility] and [facility]. They declined all 3.  Pt has asked that niece Tammy assist with making the decision..The preferred facilities are [facility] and [facility].  Niece contact information-Tammy [phone].  TOC team will continue to assist with discharge planning needs.      Expected Discharge Plan: [place] Barriers to Discharge: Continued Medical Work up, SNF Pending bed offer  Expected Discharge Plan and Services Expected Discharge Plan: [place] Choice: [place] arrangements for the past 2 months: Apartment                                       Social Determinants of Health (SDOH) Interventions    Readmission Risk Interventions No flowsheet data found.

## 2021-06-29 NOTE — Plan of Care (Signed)

## 2021-06-29 NOTE — Progress Notes (Signed)
PROGRESS NOTE  Carolyn Valenzuela:[id number] DOB: 01/06/1951   PCP: Biagio Borg, MD  Patient is from: Home.  Lives alone.  DOA: 06/25/2021 LOS: 1  Chief complaints:  Chief Complaint  Patient presents with   Diarrhea   Weakness     Brief Narrative / Interim history: 70 year old F with PMH of ESRD on HD MWF, DM-2, PAD/bilateral BKA, CAD, HTN and dyslipidemia presenting with abdominal pain, diarrhea and suprapubic discomfort, and admitted with cholelithiasis to rule out cholecystitis.  Vitals and blood work in ED without significant finding.  CT abdomen and pelvis with cholelithiasis and distended gallbladder but no cholecystitis or biliary ductal dilation.  CT chest with small bilateral pleural effusions.  RUQ ultrasound showed distended gallbladder with gallbladder wall thickening and multiple large gallstones and sludge but no sonographic Murphy sign.  CBD 6.7 mm.  HIDA scan negative for acute cholecystitis.  C. difficile and GIP panel ordered but has not been collected yet.  Patient's diarrhea and abdominal pain seems to have resolved.  C. difficile and GI panel negative.  Confused at times.  Therapy recommends SNF.  Subjective: Seen and examined earlier this morning.  She was crying stating that "her sisters are getting on her nerves". She is not elaborating more.  She denies pain, shortness of breath, nausea, vomiting or abdominal pain.  Diarrhea improved.  She said the stool is loose and no longer watery.  She is oriented x4 but slow to answer.   Objective: Vitals:   06/28/21 1235 06/28/21 2018 06/29/21 0357 06/29/21 0830  BP: (!) 135/55 (!) 134/56  (!) 147/47  Pulse: 69 61  64  Resp: 18 14    Temp: 98 F (36.7 C) 97.9 F (36.6 C)  97.8 F (36.6 C)  TempSrc: Oral Oral  Oral  SpO2: 100%   100%  Weight: 50.9 kg  51.6 kg   Height:        Intake/Output Summary (Last 24 hours) at 06/29/2021 1526 Last data filed at 06/29/2021 1319 Gross per 24 hour  Intake 360 ml  Output  --  Net 360 ml   Filed Weights   06/28/21 0847 06/28/21 1235 06/29/21 0357  Weight: 53 kg 50.9 kg 51.6 kg    Examination:  GENERAL: Frail looking elderly female.  No apparent distress. HEENT: MMM.  Vision and hearing grossly intact.  NECK: Supple.  No apparent JVD.  RESP: On RA.  No IWOB.  Fair aeration bilaterally. CVS:  RRR. Heart sounds normal.  ABD/GI/GU: BS+. Abd soft, NTND.  MSK/EXT:  Moves extremities.  Bilateral BKA. SKIN: no apparent skin lesion or wound NEURO: Awake. Oriented x4 but slow.  No apparent focal neuro deficit. PSYCH: Calm. Normal affect.   Procedures:  None  Microbiology summarized: NFAOZ-30 and influenza PCR nonreactive. C. difficile and GIP panel negative.  Assessment & Plan: Abdominal pain/tenderness/diarrhea-suspect functional diarrhea and possible biliary colic from cholelithiasis.  CT and RUQ [place] with gallbladder wall thickening and sludge but HIDA scan negative for cholecystitis.  Abdominal exam benign.  C. difficile and GIP negative.  Abdominal pain seems to have resolved.  Diarrhea improved.  Reports loose stool but no BM charted. -Continue Imodium and psyllium  Cholelithiasis without cholecystitis-CT, RUQ [place] and HIDA as above. -Outpatient follow-up with general surgery  Delirium: Intermittently confused.  Seems better today she is oriented x4 but somewhat slow to answer orientation questions.  Might have underlying cognitive impairment.  No focal neurodeficit to suggest CVA.  Low suspicion for infectious process.  Did not receive sedating medication.   -Check basic encephalopathy labs if worse. -Reorientation and delirium precaution. -Avoid sedating medications   ESRD on HD (M-W-F): HD off schedule on 10/27 and 10/29.   -Back on schedule on 10/31.  Hypokalemia/hyponatremia: Resolved.    Hyperkalemia: K5.5.  Received Lokelma 10 g. -Recheck in the morning  Anemia of renal disease: Stable. Recent Labs    02/13/21 1115 06/25/21 0835  06/26/21 0335 06/27/21 0020  HGB 10.0* 11.2* 10.9* 11.0*  -Per nephrology  CAD/PVD/bilateral BKA-uses prosthetic legs -Decrease Crestor to ESRD dose -Continue aspirin and Plavix -Discontinued Crestor due to rhabdo. -PT/OT eval  Mild rhabdomyolysis: CK elevated to 860. -Cannot do IV fluid due to ESRD. -Recheck CK in the morning  Chronic diastolic CHF: TTE 10/9474 with LVEF of 65 to 54%, diastolic dysfunction.  Appears euvolemic with no cardiopulmonary symptoms.  Persistently elevated BNP> 4500 suggest this poor prognosis.  -Fluid management by dialysis  Essential HTN.  SBP in 130s and 140s.  DBP in 40s and 50s. -Continue blood pressure control with amlodipine and nebivolol.    Controlled DM-2 with ESRD: A1c 5.4%. Recent Labs  Lab 06/27/21 1710 06/27/21 1955 06/28/21 0032 06/28/21 0441 06/28/21 2011  GLUCAP 92 123* 96 97 139*  -Discontinued CBG monitoring and SSI   Physical deconditioning/bilateral BKA/fall at home: Patient's sister, [name] concerned about patient being at home alone.  Per [name], she slid off the bed recently and was not able to get back on.  -Therapy recommends SNF which is appropriate.  Body mass index is 22.22 kg/m.         DVT prophylaxis:  heparin injection 5,000 Units Start: 06/26/21 1400 SCDs Start: 06/25/21 2235  Code Status: Full code Family Communication: Attempted to call patient's sister, [name] but no answer.  Updated [name], patient's other sister Status is: Observation  The patient remains OBS appropriate and will d/c before 2 midnights.  Patient needs safe disposition/SNF    Consultants:  Nephrology   Sch Meds:  Scheduled Meds:  amLODipine  10 mg Oral Daily   aspirin  81 mg Oral Daily   brimonidine  1 drop Both Eyes TID   Chlorhexidine Gluconate Cloth  6 each Topical Q0600   cinacalcet  30 mg Oral Q M,W,F-1800   clopidogrel  75 mg Oral Daily   cycloSPORINE  1 drop Both Eyes BID   gabapentin  100 mg Oral QHS   heparin  injection (subcutaneous)  5,000 Units Subcutaneous Q8H   multivitamin  1 tablet Oral Daily   nebivolol  5 mg Oral Daily   pantoprazole  40 mg Oral Daily   psyllium  1 packet Oral Daily   sodium chloride flush  3 mL Intravenous Q12H   Continuous Infusions:  sodium chloride     PRN Meds:.sodium chloride, acetaminophen **OR** acetaminophen, albuterol, loperamide, ondansetron **OR** ondansetron (ZOFRAN) IV, sodium chloride flush  Antimicrobials: Anti-infectives (From admission, onward)    Start     Dose/Rate Route Frequency Ordered Stop   06/25/21 1715  cefTRIAXone (ROCEPHIN) 1 g in sodium chloride 0.9 % 100 mL IVPB        1 g 200 mL/hr over 30 Minutes Intravenous  Once 06/25/21 1709 06/25/21 1801        I have personally reviewed the following labs and images: CBC: Recent Labs  Lab 06/25/21 0835 06/26/21 0335 06/27/21 0020  WBC 5.3 5.4 6.5  NEUTROABS 3.0 2.6  --   HGB 11.2* 10.9* 11.0*  HCT 33.4* 31.8* 32.6*  MCV 90.5 90.1 89.6  PLT 95* 71* 102*   BMP &GFR Recent Labs  Lab 06/25/21 0835 06/25/21 1659 06/26/21 0335 06/27/21 0415 06/28/21 0433  NA 136 134* 132* 136 136  K 3.3* 3.2* 3.5 3.7 5.5*  CL 92* 93* 93* 98 98  CO2 30 28 25 26 23   GLUCOSE 77 77 50* 93 94  BUN 8 11 12  7* 12  CREATININE 4.84* 5.34* 5.72* 3.66* 4.81*  CALCIUM 9.9 9.6 9.6 9.3 9.8  MG 1.8  --  1.9  --  1.9  PHOS  --   --  4.0  --  2.9   Estimated Creatinine Clearance: 7.9 mL/min (A) (by C-G formula based on SCr of 4.81 mg/dL (H)). Liver & Pancreas: Recent Labs  Lab 06/25/21 0835 06/25/21 1659 06/26/21 0335 06/28/21 0433  AST 91* 86* 76*  --   ALT 32 33 31  --   ALKPHOS 124 126 111  --   BILITOT 1.2 1.1 0.9  --   PROT 5.4* 5.2* 5.1*  --   ALBUMIN 2.4* 2.2* 2.2* 2.5*   Recent Labs  Lab 06/25/21 0835 06/25/21 1659 06/28/21 0433  LIPASE 31 29 23    No results for input(s): AMMONIA in the last 168 hours. Diabetic: No results for input(s): HGBA1C in the last 72 hours.  Recent  Labs  Lab 06/27/21 1710 06/27/21 1955 06/28/21 0032 06/28/21 0441 06/28/21 2011  GLUCAP 92 123* 96 97 139*   Cardiac Enzymes: Recent Labs  Lab 06/28/21 0433  CKTOTAL 860*   No results for input(s): PROBNP in the last 8760 hours. Coagulation Profile: No results for input(s): INR, PROTIME in the last 168 hours. Thyroid Function Tests: No results for input(s): TSH, T4TOTAL, FREET4, T3FREE, THYROIDAB in the last 72 hours.  Lipid Profile: No results for input(s): CHOL, HDL, LDLCALC, TRIG, CHOLHDL, LDLDIRECT in the last 72 hours. Anemia Panel: No results for input(s): VITAMINB12, FOLATE, FERRITIN, TIBC, IRON, RETICCTPCT in the last 72 hours. Urine analysis:    Component Value Date/Time   COLORURINE YELLOW 09/15/2016 1300   APPEARANCEUR CLEAR 09/15/2016 1300   LABSPEC 1.010 09/15/2016 1300   PHURINE 7.5 09/15/2016 1300   GLUCOSEU 250 (A) 09/15/2016 1300   HGBUR SMALL (A) 09/15/2016 1300   BILIRUBINUR NEGATIVE 09/15/2016 1300   KETONESUR NEGATIVE 09/15/2016 1300   PROTEINUR >300 (A) 03/19/2016 0206   UROBILINOGEN 0.2 09/15/2016 1300   NITRITE NEGATIVE 09/15/2016 1300   LEUKOCYTESUR NEGATIVE 09/15/2016 1300   Sepsis Labs: Invalid input(s): PROCALCITONIN, [place]  Microbiology: Recent Results (from the past 240 hour(s))  Resp Panel by RT-PCR (Flu A&B, Covid) Nasopharyngeal Swab     Status: None   Collection Time: 06/25/21  6:28 PM   Specimen: Nasopharyngeal Swab; Nasopharyngeal(NP) swabs in vial transport medium  Result Value Ref Range Status   SARS Coronavirus 2 by RT PCR NEGATIVE NEGATIVE Final    Comment: (NOTE) SARS-CoV-2 target nucleic acids are NOT DETECTED.  The SARS-CoV-2 RNA is generally detectable in upper respiratory specimens during the acute phase of infection. The lowest concentration of SARS-CoV-2 viral copies this assay can detect is 138 copies/mL. A negative result does not preclude SARS-Cov-2 infection and should not be used as the sole basis for  treatment or other patient management decisions. A negative result may occur with  improper specimen collection/handling, submission of specimen other than nasopharyngeal swab, presence of viral mutation(s) within the areas targeted by this assay, and inadequate number of viral copies(<138 copies/mL). A negative result must be combined  with clinical observations, patient history, and epidemiological information. The expected result is Negative.  Fact Sheet for Patients:  EntrepreneurPulse.com.au  Fact Sheet for Healthcare Providers:  IncredibleEmployment.be  This test is no t yet approved or cleared by the [place] FDA and  has been authorized for detection and/or diagnosis of SARS-CoV-2 by FDA under an Emergency Use Authorization (EUA). This EUA will remain  in effect (meaning this test can be used) for the duration of the COVID-19 declaration under Section 564(b)(1) of the Act, 21 U.S.C.section 360bbb-3(b)(1), unless the authorization is terminated  or revoked sooner.       Influenza A by PCR NEGATIVE NEGATIVE Final   Influenza B by PCR NEGATIVE NEGATIVE Final    Comment: (NOTE) The Xpert Xpress SARS-CoV-2/FLU/RSV plus assay is intended as an aid in the diagnosis of influenza from Nasopharyngeal swab specimens and should not be used as a sole basis for treatment. Nasal washings and aspirates are unacceptable for Xpert Xpress SARS-CoV-2/FLU/RSV testing.  Fact Sheet for Patients: EntrepreneurPulse.com.au  Fact Sheet for Healthcare Providers: IncredibleEmployment.be  This test is not yet approved or cleared by the [place] FDA and has been authorized for detection and/or diagnosis of SARS-CoV-2 by FDA under an Emergency Use Authorization (EUA). This EUA will remain in effect (meaning this test can be used) for the duration of the COVID-19 declaration under Section 564(b)(1) of the Act, 21  U.S.C. section 360bbb-3(b)(1), unless the authorization is terminated or revoked.  Performed at [facility], [place] [address] [name], [place] [address]   C Difficile Quick Screen w PCR reflex     Status: None   Collection Time: 06/27/21 10:03 PM   Specimen: STOOL  Result Value Ref Range Status   C Diff antigen NEGATIVE NEGATIVE Final   C Diff toxin NEGATIVE NEGATIVE Final   C Diff interpretation No C. difficile detected.  Final    Comment: Performed at [facility], [place] [address] [place], [place] [address]  Gastrointestinal Panel by PCR , Stool     Status: None   Collection Time: 06/27/21 10:03 PM   Specimen: STOOL  Result Value Ref Range Status   Campylobacter species NOT DETECTED NOT DETECTED Final   Plesimonas shigelloides NOT DETECTED NOT DETECTED Final   Salmonella species NOT DETECTED NOT DETECTED Final   Yersinia enterocolitica NOT DETECTED NOT DETECTED Final   Vibrio species NOT DETECTED NOT DETECTED Final   Vibrio cholerae NOT DETECTED NOT DETECTED Final   Enteroaggregative E coli (EAEC) NOT DETECTED NOT DETECTED Final   Enteropathogenic E coli (EPEC) NOT DETECTED NOT DETECTED Final   Enterotoxigenic E coli (ETEC) NOT DETECTED NOT DETECTED Final   Shiga like toxin producing E coli (STEC) NOT DETECTED NOT DETECTED Final   Shigella/Enteroinvasive E coli (EIEC) NOT DETECTED NOT DETECTED Final   Cryptosporidium NOT DETECTED NOT DETECTED Final   Cyclospora cayetanensis NOT DETECTED NOT DETECTED Final   Entamoeba histolytica NOT DETECTED NOT DETECTED Final   Giardia lamblia NOT DETECTED NOT DETECTED Final   Adenovirus F40/41 NOT DETECTED NOT DETECTED Final   Astrovirus NOT DETECTED NOT DETECTED Final   Norovirus GI/GII NOT DETECTED NOT DETECTED Final   Rotavirus A NOT DETECTED NOT DETECTED Final   Sapovirus (I, II, IV, and V) NOT DETECTED NOT DETECTED Final    Comment: Performed at [facility], [address] [place], [place] [address]     Radiology Studies: No results found.    Jeshawn Melucci T. Girard  If 7PM-7AM, please contact night-coverage [email or web address] 06/29/2021, 3:26 PM

## 2021-06-29 NOTE — Progress Notes (Addendum)
Patient ID: Carolyn Valenzuela, female   DOB: 1950-12-06, 70 y.o.   MRN: [id number] S: Carolyn Valenzuela is tearful today stating "none of my people have been coming to see me or check in on me".  She remains confused. O:BP (!) 147/47 (BP Location: Right Wrist)   Pulse 64   Temp 97.8 F (36.6 C) (Oral)   Resp 14   Ht 5' (1.524 m)   Wt 51.6 kg   SpO2 100%   BMI 22.22 kg/m   Intake/Output Summary (Last 24 hours) at 06/29/2021 0932 Last data filed at 06/28/2021 1235 Gross per 24 hour  Intake --  Output 1000 ml  Net -1000 ml   Intake/Output: I/O last 3 completed shifts: In: -  Out: 1000 [Other:1000]  Intake/Output this shift:  No intake/output data recorded. Weight change: 0 kg BUL:AGTX emotional distress, tearful CVS:RRR Resp:CTA Abd: +BS, soft, NT/ND Ext: s/p bilateral BKA's, no edema, LUE AVF +T/B  Recent Labs  Lab 06/25/21 0835 06/25/21 1659 06/26/21 0335 06/27/21 0415 06/28/21 0433  NA 136 134* 132* 136 136  K 3.3* 3.2* 3.5 3.7 5.5*  CL 92* 93* 93* 98 98  CO2 30 28 25 26 23   GLUCOSE 77 77 50* 93 94  BUN 8 11 12  7* 12  CREATININE 4.84* 5.34* 5.72* 3.66* 4.81*  ALBUMIN 2.4* 2.2* 2.2*  --  2.5*  CALCIUM 9.9 9.6 9.6 9.3 9.8  PHOS  --   --  4.0  --  2.9  AST 91* 86* 76*  --   --   ALT 32 33 31  --   --    Liver Function Tests: Recent Labs  Lab 06/25/21 0835 06/25/21 1659 06/26/21 0335 06/28/21 0433  AST 91* 86* 76*  --   ALT 32 33 31  --   ALKPHOS 124 126 111  --   BILITOT 1.2 1.1 0.9  --   PROT 5.4* 5.2* 5.1*  --   ALBUMIN 2.4* 2.2* 2.2* 2.5*   Recent Labs  Lab 06/25/21 0835 06/25/21 1659 06/28/21 0433  LIPASE 31 29 23    No results for input(s): AMMONIA in the last 168 hours. CBC: Recent Labs  Lab 06/25/21 0835 06/26/21 0335 06/27/21 0020  WBC 5.3 5.4 6.5  NEUTROABS 3.0 2.6  --   HGB 11.2* 10.9* 11.0*  HCT 33.4* 31.8* 32.6*  MCV 90.5 90.1 89.6  PLT 95* 71* 102*   Cardiac Enzymes: Recent Labs  Lab 06/28/21 0433  CKTOTAL 860*   CBG: Recent  Labs  Lab 06/27/21 1710 06/27/21 1955 06/28/21 0032 06/28/21 0441 06/28/21 2011  GLUCAP 92 123* 96 97 139*    Iron Studies: No results for input(s): IRON, TIBC, TRANSFERRIN, FERRITIN in the last 72 hours. Studies/Results: No results found.  amLODipine  10 mg Oral Daily   aspirin  81 mg Oral Daily   brimonidine  1 drop Both Eyes TID   Chlorhexidine Gluconate Cloth  6 each Topical Q0600   cinacalcet  30 mg Oral Q M,W,F-1800   clopidogrel  75 mg Oral Daily   cycloSPORINE  1 drop Both Eyes BID   gabapentin  100 mg Oral QHS   heparin injection (subcutaneous)  5,000 Units Subcutaneous Q8H   multivitamin  1 tablet Oral Daily   nebivolol  5 mg Oral Daily   pantoprazole  40 mg Oral Daily   psyllium  1 packet Oral Daily   sodium chloride flush  3 mL Intravenous Q12H   sucroferric oxyhydroxide  500 mg Oral  BID    BMET    Component Value Date/Time   NA 136 06/28/2021 0433   K 5.5 (H) 06/28/2021 0433   CL 98 06/28/2021 0433   CO2 23 06/28/2021 0433   GLUCOSE 94 06/28/2021 0433   BUN 12 06/28/2021 0433   CREATININE 4.81 (H) 06/28/2021 0433   CREATININE 6.64 (H) 03/12/2020 1058   CALCIUM 9.8 06/28/2021 0433   CALCIUM 7.2 (L) 08/07/2014 1406   GFRNONAA 9 (L) 06/28/2021 0433   GFRAA 5 (L) 10/10/2019 1851   CBC    Component Value Date/Time   WBC 6.5 06/27/2021 0020   RBC 3.64 (L) 06/27/2021 0020   HGB 11.0 (L) 06/27/2021 0020   HCT 32.6 (L) 06/27/2021 0020   PLT 102 (L) 06/27/2021 0020   MCV 89.6 06/27/2021 0020   MCH 30.2 06/27/2021 0020   MCHC 33.7 06/27/2021 0020   RDW 19.1 (H) 06/27/2021 0020   LYMPHSABS 2.1 06/26/2021 0335   MONOABS 0.6 06/26/2021 0335   EOSABS 0.1 06/26/2021 0335   BASOSABS 0.1 06/26/2021 0335    Dialysis Orders:  Center: [facility]  on MWF. 160NRe, 4 hours, BFR 400, DFR 500, EDW 54.3kg, 3K, 2Ca, AVF 15g, heparin 2500 unit bolus Mircera 53mcg IV q 2 weeks- last dose 06/23/21 Hectorol 62mcg IV q HD Sensipar 30mg  2 times per week Velphoro 500mg  2 tabs  PO TID with meals   Assessment/Plan:  Cholelithiasis/diarrhea: HIDA scan negative.   C diff stool panel pending, supportive therapy per primary team.  ESRD:  MWF, missed last HD due to ED visit.  Volume and electrolytes stable.  Will plan for HD tomorrow to get back on outpatient schedule.  Last AF down from baseline slightly, planned for fistulogram outpatient but can try to arrange for fistulogram here if any issues with HD.  Hypertension/volume: BP overall well controlled. She did have some dizziness with standing this AM. Does not appear volume overloaded and has minimal PO intake lately, UFG 1L with HD as tolerated.  She is 3 kg below edw and will need to have new edw at time of discharge.  Anemia: Hemoglobin at goal, ESA recently dosed. No bleeding reported. Continue to trend Hgb.   Metabolic bone disease: Corrected calcium is high, will hold hectorol for now. Continue sensipar. Phos low due to poor po intake and will hold velphoro for now and resume when eating better and phos gets above 3.5.  Nutrition:  Currently NPO Acute metabolic encephalopathy vs delirium vs dementia - pt remains confused.  Continue to avoid sedatives.   Deconditioning and weakness - evaluated by PT/OT and recommended SNF placement.  SW/CM assisting pending bed offers.  Donetta Potts, MD [employer] [phone]

## 2021-06-30 DIAGNOSIS — K8050 Calculus of bile duct without cholangitis or cholecystitis without obstruction: Secondary | ICD-10-CM | POA: Diagnosis not present

## 2021-06-30 DIAGNOSIS — N186 End stage renal disease: Secondary | ICD-10-CM | POA: Diagnosis not present

## 2021-06-30 DIAGNOSIS — Z992 Dependence on renal dialysis: Secondary | ICD-10-CM | POA: Diagnosis not present

## 2021-06-30 DIAGNOSIS — R197 Diarrhea, unspecified: Secondary | ICD-10-CM | POA: Diagnosis not present

## 2021-06-30 DIAGNOSIS — K591 Functional diarrhea: Secondary | ICD-10-CM

## 2021-06-30 DIAGNOSIS — T879 Unspecified complications of amputation stump: Secondary | ICD-10-CM

## 2021-06-30 DIAGNOSIS — E1129 Type 2 diabetes mellitus with other diabetic kidney complication: Secondary | ICD-10-CM | POA: Diagnosis not present

## 2021-06-30 DIAGNOSIS — I739 Peripheral vascular disease, unspecified: Secondary | ICD-10-CM

## 2021-06-30 DIAGNOSIS — K8020 Calculus of gallbladder without cholecystitis without obstruction: Secondary | ICD-10-CM | POA: Diagnosis not present

## 2021-06-30 LAB — RENAL FUNCTION PANEL
Albumin: 2.2 g/dL — ABNORMAL LOW (ref 3.5–5.0)
Albumin: 2.2 g/dL — ABNORMAL LOW (ref 3.5–5.0)
Anion gap: 8 (ref 5–15)
Anion gap: 8 (ref 5–15)
BUN: 10 mg/dL (ref 8–23)
BUN: 11 mg/dL (ref 8–23)
CO2: 26 mmol/L (ref 22–32)
CO2: 29 mmol/L (ref 22–32)
Calcium: 9.6 mg/dL (ref 8.9–10.3)
Calcium: 9.6 mg/dL (ref 8.9–10.3)
Chloride: 97 mmol/L — ABNORMAL LOW (ref 98–111)
Chloride: 95 mmol/L — ABNORMAL LOW (ref 98–111)
Creatinine, Ser: 3.78 mg/dL — ABNORMAL HIGH (ref 0.44–1.00)
Creatinine, Ser: 4.03 mg/dL — ABNORMAL HIGH (ref 0.44–1.00)
GFR, Estimated: 12 mL/min — ABNORMAL LOW (ref >60)
GFR, Estimated: 11 mL/min — ABNORMAL LOW (ref >60)
Glucose, Bld: 250 mg/dL — ABNORMAL HIGH (ref 70–99)
Glucose, Bld: 241 mg/dL — ABNORMAL HIGH (ref 70–99)
Phosphorus: 2.4 mg/dL — ABNORMAL LOW (ref 2.5–4.6)
Phosphorus: 2.5 mg/dL (ref 2.5–4.6)
Potassium: 3.5 mmol/L (ref 3.5–5.1)
Potassium: 3.4 mmol/L — ABNORMAL LOW (ref 3.5–5.1)
Sodium: 131 mmol/L — ABNORMAL LOW (ref 135–145)
Sodium: 132 mmol/L — ABNORMAL LOW (ref 135–145)

## 2021-06-30 LAB — CBC
HCT: 31.3 % — ABNORMAL LOW (ref 36.0–46.0)
Hemoglobin: 10.8 g/dL — ABNORMAL LOW (ref 12.0–15.0)
MCH: 31.3 pg (ref 26.0–34.0)
MCHC: 34.5 g/dL (ref 30.0–36.0)
MCV: 90.7 fL (ref 80.0–100.0)
Platelets: 86 10*3/uL — ABNORMAL LOW (ref 150–400)
RBC: 3.45 MIL/uL — ABNORMAL LOW (ref 3.87–5.11)
RDW: 20.4 % — ABNORMAL HIGH (ref 11.5–15.5)
WBC: 4.3 10*3/uL (ref 4.0–10.5)
nRBC: 1.2 % — ABNORMAL HIGH (ref 0.0–0.2)

## 2021-06-30 LAB — MAGNESIUM: Magnesium: 1.8 mg/dL (ref 1.7–2.4)

## 2021-06-30 LAB — RESP PANEL BY RT-PCR (FLU A&B, COVID) ARPGX2
Influenza A by PCR: NEGATIVE
Influenza B by PCR: NEGATIVE
SARS Coronavirus 2 by RT PCR: NEGATIVE

## 2021-06-30 LAB — CK: Total CK: 391 U/L — ABNORMAL HIGH (ref 38–234)

## 2021-06-30 MED ORDER — PSYLLIUM 95 % PO PACK: 1.0000 | PACK | Freq: Every day | ORAL | Status: DC

## 2021-06-30 MED ORDER — ONDANSETRON HCL 4 MG PO TABS
4.0000 mg | ORAL_TABLET | Freq: Three times a day (TID) | ORAL | 0 refills | Status: DC | PRN
Start: 2021-06-30 — End: 2021-08-22

## 2021-06-30 MED ORDER — LOPERAMIDE HCL 2 MG PO CAPS: 2.0000 mg | ORAL_CAPSULE | ORAL | 0 refills | Status: DC | PRN

## 2021-06-30 MED ORDER — CINACALCET HCL 30 MG PO TABS: 30.0000 mg | ORAL_TABLET | ORAL | Status: DC

## 2021-06-30 MED ORDER — GABAPENTIN 100 MG PO CAPS: 100.0000 mg | ORAL_CAPSULE | Freq: Every day | ORAL | 0 refills | Status: DC

## 2021-06-30 NOTE — Discharge Summary (Signed)
Physician Discharge Summary  Carolyn Valenzuela VEH:[id number] DOB: 1951-06-25 DOA: 06/25/2021  PCP: Biagio Borg, MD  Admit date: 06/25/2021 Discharge date: 06/30/2021 Admitted From: Home. Disposition: SNF Recommendations for Outpatient Follow-up:  Please follow up on the following pending results: None Recommend palliative follow-up at SNF.  Discharge Condition: Stable. CODE STATUS: Full code  Hospital Course: 70 year old F with PMH of ESRD on HD MWF, DM-2, PAD/bilateral BKA, CAD, HTN and dyslipidemia presenting with abdominal pain, diarrhea and suprapubic discomfort, and admitted with cholelithiasis to rule out cholecystitis.  Vitals and blood work in ED without significant finding.  CT abdomen and pelvis with cholelithiasis and distended gallbladder but no cholecystitis or biliary ductal dilation.  CT chest with small bilateral pleural effusions.  RUQ ultrasound showed distended gallbladder with gallbladder wall thickening and multiple large gallstones and sludge but no sonographic Murphy sign.  CBD 6.7 mm.  HIDA scan negative for acute cholecystitis.  C. difficile and GIP panel negative.   Patient's diarrhea and abdominal pain resolved after starting Imodium and psyllium. Therapy recommends SNF.  See individual problem list below for more on hospital course.  Discharge Diagnoses:  Abdominal pain/tenderness/diarrhea-suspect functional diarrhea and possible biliary colic from cholelithiasis.  CT and RUQ [place] with gallbladder wall thickening and sludge but HIDA scan negative for cholecystitis.  Abdominal exam benign.  C. difficile and GIP negative.  Pain and diarrhea resolved. -Continue Imodium and psyllium   Cholelithiasis without cholecystitis-CT, RUQ [place] and HIDA as above. -Consider outpatient follow-up with general surgery abdominal pain recurs.   Delirium: Intermittently confused.  Oriented x4 today. -Reorientation and delirium precaution. -Avoid/minimize sedating medications    ESRD on HD (M-W-F): HD off schedule on 10/27 and 10/29.   -Back on schedule    Hypokalemia/hyponatremia: K3.4. -Nephrology to correct with HD   Hyperkalemia: Resolved.   Anemia of renal disease: Stable. Recent Labs (within last 365 days)         Recent Labs    02/13/21 1115 06/25/21 0835 06/26/21 0335 06/27/21 0020 06/30/21 0651  HGB 10.0* 11.2* 10.9* 11.0* 10.8*    -Per nephrology   CAD/PVD/bilateral BKA-uses prosthetic legs -Stop Crestor in the setting of rhabdomyolysis.  She is ESRD patient as well -Continue aspirin and Plavix -PT/OT at SNF.   Mild rhabdomyolysis: CK elevated to 860>> 391.   Chronic diastolic CHF: TTE 03/3661 with LVEF of 65 to 94%, diastolic dysfunction.  Appears euvolemic with no cardiopulmonary symptoms.  Persistently elevated BNP> 4500 suggest this poor prognosis.  -Fluid management by HD.   Essential HTN.  SBP in 130s and 140s.  DBP in 40s and 50s. -Continue blood pressure control with amlodipine and nebivolol.    Controlled DM-2 with ESRD: A1c 5.4%. -Discontinued CBG monitoring and SSI -Discontinued diabetic meds.   Physical deconditioning/bilateral BKA/fall at home: Patient's sister, [name] concerned about patient being at home alone.  Per [name], she slid off the bed recently and was not able to get back on.  -Therapy recommends SNF which is appropriate.  Body mass index is 21.87 kg/m.           Discharge Exam: Vitals:   06/30/21 1000 06/30/21 1030 06/30/21 1100 06/30/21 1114  BP: (!) 151/43 (!) 169/37 (!) 160/22 (!) 147/63  Pulse:      Temp:      Resp: _0 Height:      Weight:      SpO2:      TempSrc:      BMI (Calculated):  GENERAL: Frail looking elderly female.  No apparent distress. HEENT: MMM.  Vision and hearing grossly intact.  NECK: Supple.  No apparent JVD.  RESP: 100% on RA.  No IWOB.  Fair aeration bilaterally. CVS:  RRR. Heart sounds normal.  ABD/GI/GU: Bowel sounds present. Soft. Non tender.   MSK/EXT:  Moves extremities.  Bilateral BKA. SKIN: no apparent skin lesion or wound NEURO: Awake and alert.  Oriented x4 except date.  No apparent focal neuro deficit. PSYCH: Calm. Normal affect.   Discharge Instructions  Discharge Instructions     Diet - low sodium heart healthy   Complete by: As directed    With fluid restriction to less than 1200 cc a day   Increase activity slowly   Complete by: As directed       Allergies as of 06/30/2021       Reactions   Oxycodone Itching        Medication List     STOP taking these medications    Accu-Chek Guide Me w/Device Kit   Accu-Chek Guide test strip Generic drug: glucose blood   Accu-Chek Softclix Lancets lancets   doxycycline 100 MG tablet Commonly known as: VIBRA-TABS   FLUZONE HIGH-DOSE QUADRIVALENT IM   glipiZIDE 10 MG 24 hr tablet Commonly known as: GLUCOTROL XL   Januvia 100 MG tablet Generic drug: sitaGLIPtin   rosuvastatin 40 MG tablet Commonly known as: CRESTOR       TAKE these medications    acetaminophen 500 MG tablet Commonly known as: TYLENOL Take 500 mg by mouth every 6 (six) hours as needed for mild pain.   albuterol 108 (90 Base) MCG/ACT inhaler Commonly known as: VENTOLIN HFA Inhale 2 puffs into the lungs every 6 (six) hours as needed for wheezing or shortness of breath.   Alphagan P 0.1 % Soln Generic drug: brimonidine Place 1 drop into both eyes 3 (three) times daily.   amLODipine 10 MG tablet Commonly known as: NORVASC TAKE 1 TABLET BY MOUTH EVERYDAY AT BEDTIME What changed: See the new instructions.   aspirin 81 MG EC tablet Take 81 mg by mouth daily.   cinacalcet 30 MG tablet Commonly known as: SENSIPAR Take 1 tablet (30 mg total) by mouth every Monday, Wednesday, and Friday at 6 PM.   clopidogrel 75 MG tablet Commonly known as: PLAVIX Take 1 tablet (75 mg total) by mouth daily.   gabapentin 100 MG capsule Commonly known as: NEURONTIN Take 1 capsule (100 mg  total) by mouth at bedtime. What changed: See the new instructions.   loperamide 2 MG capsule Commonly known as: IMODIUM Take 1 capsule (2 mg total) by mouth every 4 (four) hours as needed for diarrhea or loose stools.   MIRCERA IJ Mircera   multivitamin Tabs tablet TAKE 1 TABLET BY MOUTH EVERYDAY AT BEDTIME What changed: See the new instructions.   nebivolol 5 MG tablet Commonly known as: Bystolic Take 1 tablet (5 mg total) by mouth daily.   ondansetron 4 MG tablet Commonly known as: ZOFRAN Take 1 tablet (4 mg total) by mouth every 8 (eight) hours as needed for nausea.   pantoprazole 40 MG tablet Commonly known as: PROTONIX TAKE 1 TABLET BY MOUTH TWICE A DAY What changed: when to take this   psyllium 95 % Pack Commonly known as: HYDROCIL/METAMUCIL Take 1 packet by mouth daily. Start taking on: July 01, 2021   Restasis 0.05 % ophthalmic emulsion Generic drug: cycloSPORINE Place 1 drop into both eyes 2 (two) times daily.  Velphoro 500 MG chewable tablet Generic drug: sucroferric oxyhydroxide Chew 500 mg by mouth 2 (two) times daily.        Consultations: Nephrology  Procedures/Studies: Hemodialysis   CT Chest Wo Contrast  Result Date: 06/25/2021 CLINICAL DATA:  Pneumonia.  Weakness. EXAM: CT CHEST WITHOUT CONTRAST TECHNIQUE: Multidetector CT imaging of the chest was performed following the standard protocol without IV contrast. COMPARISON:  CT abdomen and pelvis 06/25/2021. Chest x-ray 06/25/2021. FINDINGS: Cardiovascular: The heart is mildly enlarged. Aorta is normal in size. There are atherosclerotic calcifications of the aorta and coronary arteries. There is no pericardial effusion. Left axillary vascular stent is partially visualized. Mediastinum/Nodes: There are nonenlarged paratracheal and prevascular lymph nodes. There is a mildly enlarged subcarinal lymph node measuring 11 mm short axis. Visualized thyroid gland and esophagus are within normal limits.  Lungs/Pleura: There is a small amount of atelectasis in the bilateral lower lobes and lingula. There is a calcified granuloma in the right lower lobe. There is a small right pleural effusion and trace left pleural effusion. There is no pneumothorax. Trachea and central airways are patent. Upper Abdomen: Gallstones are present. There are hypodensities in the kidneys which are too small to characterize, likely cysts. Musculoskeletal: There is diffuse body wall edema. Calcifications are seen in the bilateral breasts. There is skin thickening of the anterior and posterior chest wall. There are severe/end-stage degenerative changes of the right shoulder. No acute fractures are seen. IMPRESSION: 1. Small bilateral pleural effusions 2. Bilateral lower lobe and lingular atelectasis. 3. Cardiomegaly. 4. Mildly enlarged subcarinal lymph node. 5. Diffuse body wall edema. 6. Posterior and anterior chest wall skin thickening. Correlate for infection. 7. Cholelithiasis. Aortic Atherosclerosis (ICD10-I70.0). Electronically Signed   By: Ronney Asters M.D.   On: 06/25/2021 15:14   NM Hepatobiliary Liver Func  Result Date: 06/26/2021 CLINICAL DATA:  Abdominal pain, upper, chronic, assess gallbladder motility EXAM: NUCLEAR MEDICINE HEPATOBILIARY IMAGING TECHNIQUE: Sequential images of the abdomen were obtained out to 60 minutes following intravenous administration of radiopharmaceutical. RADIOPHARMACEUTICALS:  5.2 mCi Tc-77m Choletec IV COMPARISON:  Ultrasound and CT 06/25/2021 FINDINGS: Prompt uptake and biliary excretion of activity by the liver is seen. Gallbladder activity is visualized, consistent with patency of cystic duct. Biliary activity passes into small bowel, consistent with patent common bile duct. IMPRESSION: Negative hepatobiliary scan. No scintigraphic evidence of acute cholecystitis. Electronically Signed   By: NDavina PokeD.O.   On: 06/26/2021 15:40   CT Abdomen Pelvis W Contrast  Result Date:  06/25/2021 CLINICAL DATA:  Diffuse abdominal pain infection suspected. EXAM: CT ABDOMEN AND PELVIS WITH CONTRAST TECHNIQUE: Multidetector CT imaging of the abdomen and pelvis was performed using the standard protocol following bolus administration of intravenous contrast. CONTRAST:  102mOMNIPAQUE IOHEXOL 300 MG/ML  SOLN COMPARISON:  July 09, 2011 CT and March 12, 2020 lumbar spine radiograph. FINDINGS: Lower chest: Small right greater than left pleural effusions with adjacent atelectasis. Hepatobiliary: No suspicious hepatic lesion. Cholelithiasis in a distended gallbladder. No biliary ductal dilation. Pancreas: No pancreatic ductal dilation or evidence of acute inflammation Spleen: Within normal limits. Adrenals/Urinary Tract: Bilateral adrenal glands are unremarkable. No hydronephrosis. Atrophic kidneys. Hypodense 1.2 cm left renal cyst. Subcentimeter hypodense bilateral renal lesions technically too small to accurately characterize but statistically likely represent cysts. Urinary bladder is decompressed. Stomach/Bowel: Stomach is unremarkable for degree of distension. No pathologic dilation of small or large bowel. The appendix and terminal ileum appear normal. Sigmoid colonic diverticulosis without findings of acute diverticulitis. Vascular/Lymphatic: Aortic  and branch vessel atherosclerosis without abdominal aortic aneurysm. No pathologically enlarged abdominal or pelvic lymph nodes. Reproductive: Calcified uterine leiomyomas. No suspicious adnexal mass. Other: No significant abdominopelvic free fluid. Diffuse subcutaneous edema. Musculoskeletal: Vacuum disc artifact with endplate sclerosis and remodeling at L4-L5 similar to prior radiograph March 12, 2020. L5-S1 discogenic disease. IMPRESSION: 1. Cholelithiasis in a distended gallbladder, without other CT findings of cholecystitis. Consider further evaluation with right upper quadrant ultrasound if clinically indicated. 2. Small right greater than left  pleural effusions with adjacent atelectasis. 3. Sigmoid colonic diverticulosis without findings of acute diverticulitis. 4. Aortic Atherosclerosis (ICD10-I70.0). Electronically Signed   By: Dahlia Bailiff M.D.   On: 06/25/2021 13:39   US Abdomen Limited  Result Date: 06/25/2021 CLINICAL DATA:  CT shows gallstones and distended gallbladder EXAM: ULTRASOUND ABDOMEN LIMITED RIGHT UPPER QUADRANT COMPARISON:  CT June 25, 2021. FINDINGS: Gallbladder: Multiple gallstones, measuring up to 1.6 cm. Layering sludge within the gallbladder. No sonographic Murphy sign per technologist. Gallbladder wall thickening, measuring 3.9 mm. Common bile duct: Diameter: 6.7 mm, dilated. Liver:Heterogeneous echotexture, widened fissures and lobular cortex. No focal lesion identified. Portal vein is patent on color Doppler imaging with normal direction of blood flow towards the liver. Other: Echogenic partially imaged right kidney IMPRESSION: 1. Distended gallbladder with gallbladder wall thickening and multiple large gallstones and sludge. No sonographic Murphy sign per the technologist. Findings are indeterminate for acute cholecystitis. HIDA scan could further evaluate if clinically uncertain. 2. Dilated common bile duct, measuring up to 6.7 mm. Recommend correlation with liver functions to exclude obstruction. MRCP could further characterize if clinically indicated. 3. Heterogeneous echotexture of the liver with widened fissures and lobular cortex of the liver. Question liver disease / developing cirrhosis. Recommend clinical correlation. 4. Echogenic partially imaged right kidney, possibly related to chronic kidney disease. Electronically Signed   By: Margaretha Sheffield M.D.   On: 06/25/2021 16:52   DG Chest Port 1 View  Result Date: 06/25/2021 CLINICAL DATA:  Weakness diarrhea dialysis patient EXAM: PORTABLE CHEST 1 VIEW COMPARISON:  03/12/2020. FINDINGS: Enlarged cardiac silhouette. Streaky left basilar opacities. No  visible pneumothorax. Probable small left pleural effusion. Severe right shoulder degenerative change. IMPRESSION: 1. Streaky left basilar opacities, favor atelectasis. Infection or aspiration is not excluded. 2. Probable small left pleural effusion. 3. Cardiomegaly. Electronically Signed   By: Margaretha Sheffield M.D.   On: 06/25/2021 13:08       The results of significant diagnostics from this hospitalization (including imaging, microbiology, ancillary and laboratory) are listed below for reference.     Microbiology: Recent Results (from the past 240 hour(s))  Resp Panel by RT-PCR (Flu A&B, Covid) Nasopharyngeal Swab     Status: None   Collection Time: 06/25/21  6:28 PM   Specimen: Nasopharyngeal Swab; Nasopharyngeal(NP) swabs in vial transport medium  Result Value Ref Range Status   SARS Coronavirus 2 by RT PCR NEGATIVE NEGATIVE Final    Comment: (NOTE) SARS-CoV-2 target nucleic acids are NOT DETECTED.  The SARS-CoV-2 RNA is generally detectable in upper respiratory specimens during the acute phase of infection. The lowest concentration of SARS-CoV-2 viral copies this assay can detect is 138 copies/mL. A negative result does not preclude SARS-Cov-2 infection and should not be used as the sole basis for treatment or other patient management decisions. A negative result may occur with  improper specimen collection/handling, submission of specimen other than nasopharyngeal swab, presence of viral mutation(s) within the areas targeted by this assay, and inadequate number of viral copies(<138 copies/mL).  A negative result must be combined with clinical observations, patient history, and epidemiological information. The expected result is Negative.  Fact Sheet for Patients:  EntrepreneurPulse.com.au  Fact Sheet for Healthcare Providers:  IncredibleEmployment.be  This test is no t yet approved or cleared by the [place] FDA and  has been  authorized for detection and/or diagnosis of SARS-CoV-2 by FDA under an Emergency Use Authorization (EUA). This EUA will remain  in effect (meaning this test can be used) for the duration of the COVID-19 declaration under Section 564(b)(1) of the Act, 21 U.S.C.section 360bbb-3(b)(1), unless the authorization is terminated  or revoked sooner.       Influenza A by PCR NEGATIVE NEGATIVE Final   Influenza B by PCR NEGATIVE NEGATIVE Final    Comment: (NOTE) The Xpert Xpress SARS-CoV-2/FLU/RSV plus assay is intended as an aid in the diagnosis of influenza from Nasopharyngeal swab specimens and should not be used as a sole basis for treatment. Nasal washings and aspirates are unacceptable for Xpert Xpress SARS-CoV-2/FLU/RSV testing.  Fact Sheet for Patients: EntrepreneurPulse.com.au  Fact Sheet for Healthcare Providers: IncredibleEmployment.be  This test is not yet approved or cleared by the [place] FDA and has been authorized for detection and/or diagnosis of SARS-CoV-2 by FDA under an Emergency Use Authorization (EUA). This EUA will remain in effect (meaning this test can be used) for the duration of the COVID-19 declaration under Section 564(b)(1) of the Act, 21 U.S.C. section 360bbb-3(b)(1), unless the authorization is terminated or revoked.  Performed at [facility], [place] [address], [place], [place] [address]   C Difficile Quick Screen w PCR reflex     Status: None   Collection Time: 06/27/21 10:03 PM   Specimen: STOOL  Result Value Ref Range Status   C Diff antigen NEGATIVE NEGATIVE Final   C Diff toxin NEGATIVE NEGATIVE Final   C Diff interpretation No C. difficile detected.  Final    Comment: Performed at [facility], [place] [address] [place], [place] [address]  Gastrointestinal Panel by PCR , Stool     Status: None   Collection Time: 06/27/21 10:03 PM   Specimen: STOOL  Result Value Ref Range Status   Campylobacter  species NOT DETECTED NOT DETECTED Final   Plesimonas shigelloides NOT DETECTED NOT DETECTED Final   Salmonella species NOT DETECTED NOT DETECTED Final   Yersinia enterocolitica NOT DETECTED NOT DETECTED Final   Vibrio species NOT DETECTED NOT DETECTED Final   Vibrio cholerae NOT DETECTED NOT DETECTED Final   Enteroaggregative E coli (EAEC) NOT DETECTED NOT DETECTED Final   Enteropathogenic E coli (EPEC) NOT DETECTED NOT DETECTED Final   Enterotoxigenic E coli (ETEC) NOT DETECTED NOT DETECTED Final   Shiga like toxin producing E coli (STEC) NOT DETECTED NOT DETECTED Final   Shigella/Enteroinvasive E coli (EIEC) NOT DETECTED NOT DETECTED Final   Cryptosporidium NOT DETECTED NOT DETECTED Final   Cyclospora cayetanensis NOT DETECTED NOT DETECTED Final   Entamoeba histolytica NOT DETECTED NOT DETECTED Final   Giardia lamblia NOT DETECTED NOT DETECTED Final   Adenovirus F40/41 NOT DETECTED NOT DETECTED Final   Astrovirus NOT DETECTED NOT DETECTED Final   Norovirus GI/GII NOT DETECTED NOT DETECTED Final   Rotavirus A NOT DETECTED NOT DETECTED Final   Sapovirus (I, II, IV, and V) NOT DETECTED NOT DETECTED Final    Comment: Performed at [facility], [address], [place], [place] [address]     Labs:  CBC: Recent Labs  Lab  06/25/21 0835 06/26/21 0335 06/27/21 0020 06/30/21 0651  WBC 5.3 5.4 6.5 4.3  NEUTROABS 3.0 2.6  --   --   HGB 11.2* 10.9* 11.0* 10.8*  HCT 33.4* 31.8* 32.6* 31.3*  MCV 90.5 90.1 89.6 90.7  PLT 95* 71* 102* 86*   BMP &GFR Recent Labs  Lab 06/25/21 0835 06/25/21 1659 06/26/21 0335 06/27/21 0415 06/28/21 0433 06/29/21 0716 06/30/21 0338 06/30/21 0651  NA 136   < > 132* 136 136 134* 131* 132*  K 3.3*   < > 3.5 3.7 5.5* 3.4* 3.5 3.4*  CL 92*   < > 93* 98 98 98 97* 95*  CO2 30   < > _0 GLUCOSE 77   < > 50* 93 94 112* 250* 241*  BUN 8   < > 12 7* 12 5* 10 11  CREATININE 4.84*   < > 5.72* 3.66* 4.81* 2.88* 3.78* 4.03*  CALCIUM  9.9   < > 9.6 9.3 9.8 9.3 9.6 9.6  MG 1.8  --  1.9  --  1.9 1.7 1.8  --   PHOS  --   --  4.0  --  2.9 1.9* 2.4* 2.5   < > = values in this interval not displayed.   Estimated Creatinine Clearance: 9.5 mL/min (A) (by C-G formula based on SCr of 4.03 mg/dL (H)). Liver & Pancreas: Recent Labs  Lab 06/25/21 0835 06/25/21 1659 06/26/21 0335 06/28/21 0433 06/29/21 0716 06/30/21 0338 06/30/21 0651  AST 91* 86* 76*  --   --   --   --   ALT 32 33 31  --   --   --   --   ALKPHOS 124 126 111  --   --   --   --   BILITOT 1.2 1.1 0.9  --   --   --   --   PROT 5.4* 5.2* 5.1*  --   --   --   --   ALBUMIN 2.4* 2.2* 2.2* 2.5* 2.3* 2.2* 2.2*   Recent Labs  Lab 06/25/21 0835 06/25/21 1659 06/28/21 0433  LIPASE _1 No results for input(s): AMMONIA in the last 168 hours. Diabetic: No results for input(s): HGBA1C in the last 72 hours. Recent Labs  Lab 06/27/21 1710 06/27/21 1955 06/28/21 0032 06/28/21 0441 06/28/21 2011  GLUCAP 92 123* 96 97 139*   Cardiac Enzymes: Recent Labs  Lab 06/28/21 0433 06/29/21 0716 06/30/21 0338  CKTOTAL 860* 853* 391*   No results for input(s): PROBNP in the last 8760 hours. Coagulation Profile: No results for input(s): INR, PROTIME in the last 168 hours. Thyroid Function Tests: No results for input(s): TSH, T4TOTAL, FREET4, T3FREE, THYROIDAB in the last 72 hours. Lipid Profile: No results for input(s): CHOL, HDL, LDLCALC, TRIG, CHOLHDL, LDLDIRECT in the last 72 hours. Anemia Panel: No results for input(s): VITAMINB12, FOLATE, FERRITIN, TIBC, IRON, RETICCTPCT in the last 72 hours. Urine analysis:    Component Value Date/Time   COLORURINE YELLOW 09/15/2016 1300   APPEARANCEUR CLEAR 09/15/2016 1300   LABSPEC 1.010 09/15/2016 1300   PHURINE 7.5 09/15/2016 1300   GLUCOSEU 250 (A) 09/15/2016 1300   HGBUR SMALL (A) 09/15/2016 1300   BILIRUBINUR NEGATIVE 09/15/2016 1300   KETONESUR NEGATIVE 09/15/2016 1300   PROTEINUR >300 (A) 03/19/2016  0206   UROBILINOGEN 0.2 09/15/2016 1300   NITRITE NEGATIVE 09/15/2016 1300   LEUKOCYTESUR NEGATIVE 09/15/2016 1300   Sepsis Labs: Invalid input(s): PROCALCITONIN,  LACTICIDVEN   Time coordinating discharge: 45 minutes  SIGNED:  Mercy Riding, MD  [facility] 06/30/2021, 3:10 PM

## 2021-06-30 NOTE — Progress Notes (Signed)
Patient ID: Carolyn Valenzuela, female   DOB: 10-09-1950, 70 y.o.   MRN: [id number] S: Ms. Stong seen on HD, pleasant, remains confused. O:BP (!) 160/22   Pulse 62   Temp (!) 97.3 F (36.3 C) (Temporal)   Resp 18   Ht 5' (1.524 m)   Wt 50.8 kg   SpO2 100%   BMI 21.87 kg/m   Intake/Output Summary (Last 24 hours) at 06/30/2021 1158 Last data filed at 06/30/2021 0802 Gross per 24 hour  Intake 780 ml  Output --  Net 780 ml    Intake/Output: I/O last 3 completed shifts: In: 840 [P.O.:840] Out: -   Intake/Output this shift:  Total I/O In: 72 [P.O.:60] Out: -  Weight change: -1.5 kg Gen: no distress CVS:RRR Resp:CTA Abd: +BS, soft, NT/ND Ext: s/p bilateral BKA's, no edema, LUE AVF +T/B  OP HD: [facility]WF [facility]  4h  400/500  54.3kg  3K/2Ca bath  LUE AVF 15g   Hep 2500 - Mircera 17mcg IV q 2 weeks- last dose 06/23/21 - Hectorol 11mcg IV q HD - Sensipar 30mg  2 times per week, Velphoro 500mg  2 tabs PO TID with meals   Assessment/Plan:  Cholelithiasis/diarrhea: HIDA scan negative.   C diff stool panel pending, supportive therapy per primary team.  ESRD:  MWF, missed HD Friday due to ED visit.  Volume and electrolytes stable.  Will plan for HD today to get back on schedule.   HD access: Last AF down from baseline slightly, planned for fistulogram outpatient but can try to arrange for fistulogram here if any issues with HD.  Hypertension/volume: BP overall well controlled. She did have some dizziness with standing this AM. Does not appear volume overloaded and has minimal PO intake lately, UFG 1L with HD as tolerated.  She is 3 kg below edw and will need to have new edw at time of discharge.  Anemia: Hemoglobin at goal, ESA recently dosed. No bleeding reported. Continue to trend Hgb.   Metabolic bone disease: Corrected calcium is high, hold hectorol for now. Continue sensipar. Phos low due to poor po intake and will hold velphoro for now and resume when eating better and phos gets above 3.5.   Nutrition:  Currently NPO Acute metabolic encephalopathyL: delirium vs dementia - pt remains confused.  Continue to avoid sedatives.   Deconditioning and weakness - evaluated by PT/OT and recommended SNF placement.  SW/CM assisting pending bed offers.   Recent Labs  Lab 06/25/21 0835 06/25/21 1659 06/26/21 0335 06/27/21 0415 06/28/21 0433 06/29/21 0716 06/30/21 0338 06/30/21 0651  NA 136 134* 132* 136 136 134* 131* 132*  K 3.3* 3.2* 3.5 3.7 5.5* 3.4* 3.5 3.4*  CL 92* 93* 93* 98 98 98 97* 95*  CO2 30 28 25 26 23 25 26 29   GLUCOSE 77 77 50* 93 94 112* 250* 241*  BUN 8 11 12  7* 12 5* 10 11  CREATININE 4.84* 5.34* 5.72* 3.66* 4.81* 2.88* 3.78* 4.03*  ALBUMIN 2.4* 2.2* 2.2*  --  2.5* 2.3* 2.2* 2.2*  CALCIUM 9.9 9.6 9.6 9.3 9.8 9.3 9.6 9.6  PHOS  --   --  4.0  --  2.9 1.9* 2.4* 2.5  AST 91* 86* 76*  --   --   --   --   --   ALT 32 33 31  --   --   --   --   --     Liver Function Tests: Recent Labs  Lab 06/25/21 0835 06/25/21 1659  06/26/21 0335 06/28/21 0433 06/29/21 0716 06/30/21 0338 06/30/21 0651  AST 91* 86* 76*  --   --   --   --   ALT 32 33 31  --   --   --   --   ALKPHOS 124 126 111  --   --   --   --   BILITOT 1.2 1.1 0.9  --   --   --   --   PROT 5.4* 5.2* 5.1*  --   --   --   --   ALBUMIN 2.4* 2.2* 2.2*   < > 2.3* 2.2* 2.2*   < > = values in this interval not displayed.    Recent Labs  Lab 06/25/21 0835 06/25/21 1659 06/28/21 0433  LIPASE 31 29 23     No results for input(s): AMMONIA in the last 168 hours. CBC: Recent Labs  Lab 06/25/21 0835 06/26/21 0335 06/27/21 0020 06/30/21 0651  WBC 5.3 5.4 6.5 4.3  NEUTROABS 3.0 2.6  --   --   HGB 11.2* 10.9* 11.0* 10.8*  HCT 33.4* 31.8* 32.6* 31.3*  MCV 90.5 90.1 89.6 90.7  PLT 95* 71* 102* 86*    Cardiac Enzymes: Recent Labs  Lab 06/28/21 0433 06/29/21 0716 06/30/21 0338  CKTOTAL 860* 853* 391*    CBG: Recent Labs  Lab 06/27/21 1710 06/27/21 1955 06/28/21 0032 06/28/21 0441  06/28/21 2011  GLUCAP 92 123* 96 97 139*     Iron Studies: No results for input(s): IRON, TIBC, TRANSFERRIN, FERRITIN in the last 72 hours. Studies/Results: No results found.  amLODipine  10 mg Oral Daily   aspirin  81 mg Oral Daily   brimonidine  1 drop Both Eyes TID   Chlorhexidine Gluconate Cloth  6 each Topical Q0600   cinacalcet  30 mg Oral Q M,W,F-1800   clopidogrel  75 mg Oral Daily   cycloSPORINE  1 drop Both Eyes BID   gabapentin  100 mg Oral QHS   heparin injection (subcutaneous)  5,000 Units Subcutaneous Q8H   multivitamin  1 tablet Oral Daily   nebivolol  5 mg Oral Daily   pantoprazole  40 mg Oral Daily   psyllium  1 packet Oral Daily   sodium chloride flush  3 mL Intravenous Q12H    BMET    Component Value Date/Time   NA 132 (L) 06/30/2021 0651   K 3.4 (L) 06/30/2021 0651   CL 95 (L) 06/30/2021 0651   CO2 29 06/30/2021 0651   GLUCOSE 241 (H) 06/30/2021 0651   BUN 11 06/30/2021 0651   CREATININE 4.03 (H) 06/30/2021 0651   CREATININE 6.64 (H) 03/12/2020 1058   CALCIUM 9.6 06/30/2021 0651   CALCIUM 7.2 (L) 08/07/2014 1406   GFRNONAA 11 (L) 06/30/2021 0651   GFRAA 5 (L) 10/10/2019 1851   CBC    Component Value Date/Time   WBC 4.3 06/30/2021 0651   RBC 3.45 (L) 06/30/2021 0651   HGB 10.8 (L) 06/30/2021 0651   HCT 31.3 (L) 06/30/2021 0651   PLT 86 (L) 06/30/2021 0651   MCV 90.7 06/30/2021 0651   MCH 31.3 06/30/2021 0651   MCHC 34.5 06/30/2021 0651   RDW 20.4 (H) 06/30/2021 0651   LYMPHSABS 2.1 06/26/2021 0335   MONOABS 0.6 06/26/2021 0335   EOSABS 0.1 06/26/2021 0335   BASOSABS 0.1 06/26/2021 0335

## 2021-06-30 NOTE — Progress Notes (Signed)
PROGRESS NOTE  Carolyn Valenzuela ZOX:[id number] DOB: Jan 21, 1951   PCP: Biagio Borg, MD  Patient is from: Home.  Lives alone.  DOA: 06/25/2021 LOS: 1  Chief complaints:  Chief Complaint  Patient presents with   Diarrhea   Weakness     Brief Narrative / Interim history: 70 year old F with PMH of ESRD on HD MWF, DM-2, PAD/bilateral BKA, CAD, HTN and dyslipidemia presenting with abdominal pain, diarrhea and suprapubic discomfort, and admitted with cholelithiasis to rule out cholecystitis.  Vitals and blood work in ED without significant finding.  CT abdomen and pelvis with cholelithiasis and distended gallbladder but no cholecystitis or biliary ductal dilation.  CT chest with small bilateral pleural effusions.  RUQ ultrasound showed distended gallbladder with gallbladder wall thickening and multiple large gallstones and sludge but no sonographic Murphy sign.  CBD 6.7 mm.  HIDA scan negative for acute cholecystitis.  C. difficile and GIP panel ordered but has not been collected yet.  Patient's diarrhea and abdominal pain seems to have resolved.  C. difficile and GI panel negative.  Confused at times.  Therapy recommends SNF.  Subjective: Seen and examined earlier this morning while on dialysis.  No major events overnight of this morning.  No complaints.  Denies abdominal pain or diarrhea.  She says she is passing a lot of gas.  Objective: Vitals:   06/30/21 1000 06/30/21 1030 06/30/21 1100 06/30/21 1114  BP: (!) 151/43 (!) 169/37 (!) 160/22 (!) 147/63  Pulse:      Resp: 12 13 18 20   Temp:      TempSrc:      SpO2:      Weight:      Height:        Intake/Output Summary (Last 24 hours) at 06/30/2021 1318 Last data filed at 06/30/2021 1114 Gross per 24 hour  Intake 780 ml  Output 1500 ml  Net -720 ml   Filed Weights   06/29/21 0357 06/30/21 0504 06/30/21 0829  Weight: 51.6 kg 51.5 kg 50.8 kg    Examination:  GENERAL: Frail looking elderly female.  No apparent  distress. HEENT: MMM.  Vision and hearing grossly intact.  NECK: Supple.  No apparent JVD.  RESP: On RA.  No IWOB.  Fair aeration bilaterally. CVS:  RRR. Heart sounds normal.  ABD/GI/GU: BS+. Abd soft, NTND.  MSK/EXT:  Moves extremities.  Bilateral BKA. SKIN: no apparent skin lesion or wound NEURO: Awake and alert. Oriented appropriately.  No apparent focal neuro deficit. PSYCH: Calm. Normal affect.   Procedures:  None  Microbiology summarized: [id number] and influenza PCR nonreactive. C. difficile and GIP panel negative.  Assessment & Plan: Abdominal pain/tenderness/diarrhea-suspect functional diarrhea and possible biliary colic from cholelithiasis.  CT and RUQ [place] with gallbladder wall thickening and sludge but HIDA scan negative for cholecystitis.  Abdominal exam benign.  C. difficile and GIP negative.  Pain and diarrhea resolved. -Continue Imodium and psyllium  Cholelithiasis without cholecystitis-CT, RUQ [place] and HIDA as above. -Outpatient follow-up with general surgery  Delirium: Intermittently confused.  Oriented x4 today. -Reorientation and delirium precaution. -Avoid sedating medications   ESRD on HD (M-W-F): HD off schedule on 10/27 and 10/29.   -Back on schedule   Hypokalemia/hyponatremia: K3.4. -Nephrology to correct with HD  Hyperkalemia: Resolved.  Anemia of renal disease: Stable. Recent Labs    02/13/21 1115 06/25/21 0835 06/26/21 0335 06/27/21 0020 06/30/21 0651  HGB 10.0* 11.2* 10.9* 11.0* 10.8*  -Per nephrology  CAD/PVD/bilateral BKA-uses prosthetic legs -Decrease Crestor to ESRD  dose -Continue aspirin and Plavix -Discontinued Crestor due to rhabdo. -PT/OT eval  Mild rhabdomyolysis: CK elevated to 860>> 391. -Cannot do IV fluid due to ESRD.  Chronic diastolic CHF: TTE 0/8657 with LVEF of 65 to 84%, diastolic dysfunction.  Appears euvolemic with no cardiopulmonary symptoms.  Persistently elevated BNP> 4500 suggest this poor prognosis.  -Fluid  management by dialysis  Essential HTN.  SBP in 130s and 140s.  DBP in 40s and 50s. -Continue blood pressure control with amlodipine and nebivolol.    Controlled DM-2 with ESRD: A1c 5.4%. Recent Labs  Lab 06/27/21 1710 06/27/21 1955 06/28/21 0032 06/28/21 0441 06/28/21 2011  GLUCAP 92 123* 96 97 139*  -Discontinued CBG monitoring and SSI   Physical deconditioning/bilateral BKA/fall at home: Patient's sister, [name] concerned about patient being at home alone.  Per [name], she slid off the bed recently and was not able to get back on.  -Therapy recommends SNF which is appropriate.  Body mass index is 21.87 kg/m.         DVT prophylaxis:  heparin injection 5,000 Units Start: 06/26/21 1400 SCDs Start: 06/25/21 2235  Code Status: Full code Family Communication: Updated patient's sister, [name] over the phone. Status is: Inpatient  Remains inpatient appropriate because: Safe disposition/SNF.  Patient needs transportation to HD.  Medically stable for discharge.        Consultants:  Nephrology   Sch Meds:  Scheduled Meds:  amLODipine  10 mg Oral Daily   aspirin  81 mg Oral Daily   brimonidine  1 drop Both Eyes TID   Chlorhexidine Gluconate Cloth  6 each Topical Q0600   cinacalcet  30 mg Oral Q M,W,F-1800   clopidogrel  75 mg Oral Daily   cycloSPORINE  1 drop Both Eyes BID   gabapentin  100 mg Oral QHS   heparin injection (subcutaneous)  5,000 Units Subcutaneous Q8H   multivitamin  1 tablet Oral Daily   nebivolol  5 mg Oral Daily   pantoprazole  40 mg Oral Daily   psyllium  1 packet Oral Daily   sodium chloride flush  3 mL Intravenous Q12H   Continuous Infusions:  sodium chloride     PRN Meds:.sodium chloride, acetaminophen **OR** acetaminophen, albuterol, loperamide, ondansetron **OR** ondansetron (ZOFRAN) IV, sodium chloride flush  Antimicrobials: Anti-infectives (From admission, onward)    Start     Dose/Rate Route Frequency Ordered Stop   06/25/21 1715   cefTRIAXone (ROCEPHIN) 1 g in sodium chloride 0.9 % 100 mL IVPB        1 g 200 mL/hr over 30 Minutes Intravenous  Once 06/25/21 1709 06/25/21 1801        I have personally reviewed the following labs and images: CBC: Recent Labs  Lab 06/25/21 0835 06/26/21 0335 06/27/21 0020 06/30/21 0651  WBC 5.3 5.4 6.5 4.3  NEUTROABS 3.0 2.6  --   --   HGB 11.2* 10.9* 11.0* 10.8*  HCT 33.4* 31.8* 32.6* 31.3*  MCV 90.5 90.1 89.6 90.7  PLT 95* 71* 102* 86*   BMP &GFR Recent Labs  Lab 06/25/21 0835 06/25/21 1659 06/26/21 0335 06/27/21 0415 06/28/21 0433 06/29/21 0716 06/30/21 0338 06/30/21 0651  NA 136   < > 132* 136 136 134* 131* 132*  K 3.3*   < > 3.5 3.7 5.5* 3.4* 3.5 3.4*  CL 92*   < > 93* 98 98 98 97* 95*  CO2 30   < > 25 26 23 25 26 29   GLUCOSE 77   < >  50* 93 94 112* 250* 241*  BUN 8   < > 12 7* 12 5* 10 11  CREATININE 4.84*   < > 5.72* 3.66* 4.81* 2.88* 3.78* 4.03*  CALCIUM 9.9   < > 9.6 9.3 9.8 9.3 9.6 9.6  MG 1.8  --  1.9  --  1.9 1.7 1.8  --   PHOS  --   --  4.0  --  2.9 1.9* 2.4* 2.5   < > = values in this interval not displayed.   Estimated Creatinine Clearance: 9.5 mL/min (A) (by C-G formula based on SCr of 4.03 mg/dL (H)). Liver & Pancreas: Recent Labs  Lab 06/25/21 0835 06/25/21 1659 06/26/21 0335 06/28/21 0433 06/29/21 0716 06/30/21 0338 06/30/21 0651  AST 91* 86* 76*  --   --   --   --   ALT 32 33 31  --   --   --   --   ALKPHOS 124 126 111  --   --   --   --   BILITOT 1.2 1.1 0.9  --   --   --   --   PROT 5.4* 5.2* 5.1*  --   --   --   --   ALBUMIN 2.4* 2.2* 2.2* 2.5* 2.3* 2.2* 2.2*   Recent Labs  Lab 06/25/21 0835 06/25/21 1659 06/28/21 0433  LIPASE 31 29 23    No results for input(s): AMMONIA in the last 168 hours. Diabetic: No results for input(s): HGBA1C in the last 72 hours.  Recent Labs  Lab 06/27/21 1710 06/27/21 1955 06/28/21 0032 06/28/21 0441 06/28/21 2011  GLUCAP 92 123* 96 97 139*   Cardiac Enzymes: Recent Labs  Lab  06/28/21 0433 06/29/21 0716 06/30/21 0338  CKTOTAL 860* 853* 391*   No results for input(s): PROBNP in the last 8760 hours. Coagulation Profile: No results for input(s): INR, PROTIME in the last 168 hours. Thyroid Function Tests: No results for input(s): TSH, T4TOTAL, FREET4, T3FREE, THYROIDAB in the last 72 hours.  Lipid Profile: No results for input(s): CHOL, HDL, LDLCALC, TRIG, CHOLHDL, LDLDIRECT in the last 72 hours. Anemia Panel: No results for input(s): VITAMINB12, FOLATE, FERRITIN, TIBC, IRON, RETICCTPCT in the last 72 hours. Urine analysis:    Component Value Date/Time   COLORURINE YELLOW 09/15/2016 1300   APPEARANCEUR CLEAR 09/15/2016 1300   LABSPEC 1.010 09/15/2016 1300   PHURINE 7.5 09/15/2016 1300   GLUCOSEU 250 (A) 09/15/2016 1300   HGBUR SMALL (A) 09/15/2016 1300   BILIRUBINUR NEGATIVE 09/15/2016 1300   KETONESUR NEGATIVE 09/15/2016 1300   PROTEINUR >300 (A) 03/19/2016 0206   UROBILINOGEN 0.2 09/15/2016 1300   NITRITE NEGATIVE 09/15/2016 1300   LEUKOCYTESUR NEGATIVE 09/15/2016 1300   Sepsis Labs: Invalid input(s): PROCALCITONIN, [place]  Microbiology: Recent Results (from the past 240 hour(s))  Resp Panel by RT-PCR (Flu A&B, Covid) Nasopharyngeal Swab     Status: None   Collection Time: 06/25/21  6:28 PM   Specimen: Nasopharyngeal Swab; Nasopharyngeal(NP) swabs in vial transport medium  Result Value Ref Range Status   SARS Coronavirus 2 by RT PCR NEGATIVE NEGATIVE Final    Comment: (NOTE) SARS-CoV-2 target nucleic acids are NOT DETECTED.  The SARS-CoV-2 RNA is generally detectable in upper respiratory specimens during the acute phase of infection. The lowest concentration of SARS-CoV-2 viral copies this assay can detect is 138 copies/mL. A negative result does not preclude SARS-Cov-2 infection and should not be used as the sole basis for treatment or other patient management decisions. A  negative result may occur with  improper specimen  collection/handling, submission of specimen other than nasopharyngeal swab, presence of viral mutation(s) within the areas targeted by this assay, and inadequate number of viral copies(<138 copies/mL). A negative result must be combined with clinical observations, patient history, and epidemiological information. The expected result is Negative.  Fact Sheet for Patients:  EntrepreneurPulse.com.au  Fact Sheet for Healthcare Providers:  IncredibleEmployment.be  This test is no t yet approved or cleared by the [place] FDA and  has been authorized for detection and/or diagnosis of SARS-CoV-2 by FDA under an Emergency Use Authorization (EUA). This EUA will remain  in effect (meaning this test can be used) for the duration of the COVID-19 declaration under Section 564(b)(1) of the Act, 21 U.S.C.section 360bbb-3(b)(1), unless the authorization is terminated  or revoked sooner.       Influenza A by PCR NEGATIVE NEGATIVE Final   Influenza B by PCR NEGATIVE NEGATIVE Final    Comment: (NOTE) The Xpert Xpress SARS-CoV-2/FLU/RSV plus assay is intended as an aid in the diagnosis of influenza from Nasopharyngeal swab specimens and should not be used as a sole basis for treatment. Nasal washings and aspirates are unacceptable for Xpert Xpress SARS-CoV-2/FLU/RSV testing.  Fact Sheet for Patients: EntrepreneurPulse.com.au  Fact Sheet for Healthcare Providers: IncredibleEmployment.be  This test is not yet approved or cleared by the [place] FDA and has been authorized for detection and/or diagnosis of SARS-CoV-2 by FDA under an Emergency Use Authorization (EUA). This EUA will remain in effect (meaning this test can be used) for the duration of the COVID-19 declaration under Section 564(b)(1) of the Act, 21 U.S.C. section 360bbb-3(b)(1), unless the authorization is terminated or revoked.  Performed at [facility], [place] [address], [place], [place] [address]   C Difficile Quick Screen w PCR reflex     Status: None   Collection Time: 06/27/21 10:03 PM   Specimen: STOOL  Result Value Ref Range Status   C Diff antigen NEGATIVE NEGATIVE Final   C Diff toxin NEGATIVE NEGATIVE Final   C Diff interpretation No C. difficile detected.  Final    Comment: Performed at [facility], [place] [address], [place],  [address]  Gastrointestinal Panel by PCR , Stool     Status: None   Collection Time: 06/27/21 10:03 PM   Specimen: STOOL  Result Value Ref Range Status   Campylobacter species NOT DETECTED NOT DETECTED Final   Plesimonas shigelloides NOT DETECTED NOT DETECTED Final   Salmonella species NOT DETECTED NOT DETECTED Final   Yersinia enterocolitica NOT DETECTED NOT DETECTED Final   Vibrio species NOT DETECTED NOT DETECTED Final   Vibrio cholerae NOT DETECTED NOT DETECTED Final   Enteroaggregative E coli (EAEC) NOT DETECTED NOT DETECTED Final   Enteropathogenic E coli (EPEC) NOT DETECTED NOT DETECTED Final   Enterotoxigenic E coli (ETEC) NOT DETECTED NOT DETECTED Final   Shiga like toxin producing E coli (STEC) NOT DETECTED NOT DETECTED Final   Shigella/Enteroinvasive E coli (EIEC) NOT DETECTED NOT DETECTED Final   Cryptosporidium NOT DETECTED NOT DETECTED Final   Cyclospora cayetanensis NOT DETECTED NOT DETECTED Final   Entamoeba histolytica NOT DETECTED NOT DETECTED Final   Giardia lamblia NOT DETECTED NOT DETECTED Final   Adenovirus F40/41 NOT DETECTED NOT DETECTED Final   Astrovirus NOT DETECTED NOT DETECTED Final   Norovirus GI/GII NOT DETECTED NOT DETECTED Final   Rotavirus A NOT DETECTED NOT DETECTED Final   Sapovirus (I, II, IV,  and V) NOT DETECTED NOT DETECTED Final    Comment: Performed at [facility], [address], [place], [place] [address]    Radiology Studies: No results found.    Letticia Bhattacharyya T. Travis  If 7PM-7AM, please contact  night-coverage [email or web address] 06/30/2021, 1:18 PM

## 2021-06-30 NOTE — Progress Notes (Signed)
OT Cancellation Note  Patient Details Name: Carolyn Valenzuela MRN: [id number] DOB: 07-Jul-1951   Cancelled Treatment:    Reason Eval/Treat Not Completed: Patient at procedure or test/ unavailable  Patient at dialysis, will follow back when patient is on the floor.   Corinne Ports E. Lelaina Oatis, COTA/L Acute Rehabilitation Services [place] 06/30/2021, 11:37 AM

## 2021-06-30 NOTE — TOC Progression Note (Addendum)
Transition of Care [facility]) - Progression Note    Patient Details  Name: Carolyn Valenzuela MRN: [id number] Date of Birth: Aug 01, 1951  Transition of Care [facility]) CM/SW Contact  Wandra Feinstein [place], [place] Phone Number: 06/30/2021, 3:19 PM  Clinical Narrative:   provided current SNF offers again [facility] [place]) to pt's sister [name] and niece Lynelle Smoke, they have accepted offer from [facility]. Pt is an active HD pt at [facility] MWF, chair time 6:25 and uses [employer] for transportation services. Reached out to [place] with [facility] to confirm bed availability and updated MD.   UPDATE: message received from [place] with [facility] who reports they no longer have HD bed available. Updated pt's sister who accepted offer from [facility]. Reached out to [employer] with [facility] who is checking if they are able to accept pt today.   Wandra Feinstein, MSW, LCSW [phone] (coverage)      Expected Discharge Plan: [place] Barriers to Discharge: Continued Medical Work up, SNF Pending bed offer  Expected Discharge Plan and Services Expected Discharge Plan: [place] Choice: [place] arrangements for the past 2 months: Apartment Expected Discharge Date: 06/30/21                                     Social Determinants of Health (SDOH) Interventions    Readmission Risk Interventions No flowsheet data found.

## 2021-07-01 DIAGNOSIS — E785 Hyperlipidemia, unspecified: Secondary | ICD-10-CM | POA: Diagnosis not present

## 2021-07-01 DIAGNOSIS — I10 Essential (primary) hypertension: Secondary | ICD-10-CM | POA: Diagnosis not present

## 2021-07-01 DIAGNOSIS — R109 Unspecified abdominal pain: Secondary | ICD-10-CM | POA: Diagnosis not present

## 2021-07-01 DIAGNOSIS — Z89512 Acquired absence of left leg below knee: Secondary | ICD-10-CM | POA: Diagnosis not present

## 2021-07-01 DIAGNOSIS — D638 Anemia in other chronic diseases classified elsewhere: Secondary | ICD-10-CM | POA: Diagnosis not present

## 2021-07-01 DIAGNOSIS — K8020 Calculus of gallbladder without cholecystitis without obstruction: Secondary | ICD-10-CM | POA: Diagnosis not present

## 2021-07-01 DIAGNOSIS — K591 Functional diarrhea: Secondary | ICD-10-CM | POA: Diagnosis not present

## 2021-07-01 DIAGNOSIS — R410 Disorientation, unspecified: Secondary | ICD-10-CM | POA: Diagnosis not present

## 2021-07-01 DIAGNOSIS — R6889 Other general symptoms and signs: Secondary | ICD-10-CM | POA: Diagnosis not present

## 2021-07-01 DIAGNOSIS — I739 Peripheral vascular disease, unspecified: Secondary | ICD-10-CM | POA: Diagnosis not present

## 2021-07-01 DIAGNOSIS — R197 Diarrhea, unspecified: Secondary | ICD-10-CM | POA: Diagnosis not present

## 2021-07-01 DIAGNOSIS — N2581 Secondary hyperparathyroidism of renal origin: Secondary | ICD-10-CM | POA: Diagnosis not present

## 2021-07-01 DIAGNOSIS — N186 End stage renal disease: Secondary | ICD-10-CM | POA: Diagnosis not present

## 2021-07-01 DIAGNOSIS — R531 Weakness: Secondary | ICD-10-CM | POA: Diagnosis not present

## 2021-07-01 DIAGNOSIS — R7989 Other specified abnormal findings of blood chemistry: Secondary | ICD-10-CM | POA: Diagnosis not present

## 2021-07-01 DIAGNOSIS — E0840 Diabetes mellitus due to underlying condition with diabetic neuropathy, unspecified: Secondary | ICD-10-CM | POA: Diagnosis not present

## 2021-07-01 DIAGNOSIS — K8050 Calculus of bile duct without cholangitis or cholecystitis without obstruction: Secondary | ICD-10-CM | POA: Diagnosis not present

## 2021-07-01 DIAGNOSIS — T879 Unspecified complications of amputation stump: Secondary | ICD-10-CM | POA: Diagnosis not present

## 2021-07-01 DIAGNOSIS — Z992 Dependence on renal dialysis: Secondary | ICD-10-CM | POA: Diagnosis not present

## 2021-07-01 DIAGNOSIS — Z7401 Bed confinement status: Secondary | ICD-10-CM | POA: Diagnosis not present

## 2021-07-01 DIAGNOSIS — K8080 Other cholelithiasis without obstruction: Secondary | ICD-10-CM | POA: Diagnosis not present

## 2021-07-01 DIAGNOSIS — D649 Anemia, unspecified: Secondary | ICD-10-CM | POA: Diagnosis not present

## 2021-07-01 NOTE — TOC Transition Note (Signed)
Transition of Care [facility]) - CM/SW Discharge Note   Patient Details  Name: Carolyn Valenzuela MRN: [id number] Date of Birth: Nov 02, 1950  Transition of Care [facility]) CM/SW Contact:  Tresa Endo Phone Number: 07/01/2021, 1:52 PM   Clinical Narrative:    Patient will DC to: [place]  Anticipated DC date: 07/01/2021 Family notified: Pt Sister [name] attempted  Transport by: [name]   Per MD patient ready for DC to [facility]. RN to call report prior to discharge [phone]). RN, patient, patient's family, and facility notified of DC. Discharge Summary and FL2 sent to facility. DC packet on chart. Ambulance transport requested for patient.   CSW will sign off for now as social work intervention is no longer needed. Please consult [place] again if new needs arise.       Barriers to Discharge: Continued Medical Work up, SNF Pending bed offer   Patient Goals and CMS Choice Patient states their goals for this hospitalization and ongoing recovery are:: "keep doing it" CMS Medicare.gov Compare Post Acute Care list provided to:: Patient Choice offered to / list presented to : Patient  Discharge Placement                       Discharge Plan and Services     Post Acute Care Choice: [place]                               Social Determinants of Health (SDOH) Interventions     Readmission Risk Interventions No flowsheet data found.

## 2021-07-01 NOTE — Progress Notes (Signed)
Contacted pt's clinic to advise them of pt's d/c to [facility] snf today as soon as insurance auth received. Clinic aware pt should resume tomorrow.   Melven Sartorius Renal Navigator [phone]

## 2021-07-01 NOTE — Consult Note (Signed)
   [facility] Inpatient Consult   07/01/2021  TIFFANNI SCARFO March 11, 1951 [id number]  [place] [employer] [ACO] Patient: [employer] Medicare Patient is active with the Embedded Chronic Care Management RN Care Coordinator  Primary Care Provider:  Cathlean Cower, MD, [facility], [facility], Embedded provider with a chronic care management program and team, does the [facility]  Patient is currently being recommended for a skilled nursing facility level of care for post hospital transitional needs with HD.  Plan:  Will update Embedded CCM RN of disposition when patient transitions is completed.    For questions or referrals, please contact:   Natividad Brood, RN BSN [facility] Liaison  [phone] business mobile phone Toll free office [phone]  Fax number: [phone] [email or web address]

## 2021-07-01 NOTE — Progress Notes (Signed)
Attempted report X1. Spoke with receptionist Tiffany, who transferred me to the nurses station and no answer after 15 rings. Will attempt again.

## 2021-07-01 NOTE — Plan of Care (Signed)

## 2021-07-01 NOTE — Discharge Summary (Signed)
Physician Discharge Summary  Carolyn Valenzuela PJA:[id number] DOB: 03/15/1951 DOA: 06/25/2021  PCP: Biagio Borg, MD  Admit date: 06/25/2021 Discharge date: 07/01/2021 Admitted From: Home. Disposition: SNF Recommendations for Outpatient Follow-up:  Please follow up on the following pending results: None Recommend palliative follow-up at SNF.  Discharge Condition: Stable. CODE STATUS: Full code  Contact information for after-discharge care     Destination     [facility] .   Service: Skilled Nursing Contact information: [place] [phone]                    Hospital Course: 70 year old F with PMH of ESRD on HD MWF, DM-2, PAD/bilateral BKA, CAD, HTN and dyslipidemia presenting with abdominal pain, diarrhea and suprapubic discomfort, and admitted with cholelithiasis to rule out cholecystitis.  Vitals and blood work in ED without significant finding.  CT abdomen and pelvis with cholelithiasis and distended gallbladder but no cholecystitis or biliary ductal dilation.  CT chest with small bilateral pleural effusions.  RUQ ultrasound showed distended gallbladder with gallbladder wall thickening and multiple large gallstones and sludge but no sonographic Murphy sign.  CBD 6.7 mm.  HIDA scan negative for acute cholecystitis.  C. difficile and GIP panel negative.   Patient's diarrhea and abdominal pain resolved after starting Imodium and psyllium. Therapy recommends SNF.  See individual problem list below for more on hospital course.  Discharge Diagnoses:  Abdominal pain/tenderness/diarrhea-suspect functional diarrhea and possible biliary colic from cholelithiasis.  CT and RUQ [place] with gallbladder wall thickening and sludge but HIDA scan negative for cholecystitis.  Abdominal exam benign.  C. difficile and GIP negative.  Pain and diarrhea resolved. -Continue Imodium and psyllium   Cholelithiasis without cholecystitis-CT, RUQ  [place] and HIDA as above. -Consider outpatient follow-up with general surgery abdominal pain recurs.   Delirium: Intermittently confused.  Oriented x4 today. -Reorientation and delirium precaution. -Avoid/minimize sedating medications   ESRD on HD (M-W-F): HD off schedule on 10/27 and 10/29.   -Back on schedule    Hypokalemia/hyponatremia: K3.4. -Nephrology to correct with HD   Hyperkalemia: Resolved.   Anemia of renal disease: Stable. Recent Labs (within last 365 days)         Recent Labs    02/13/21 1115 06/25/21 0835 06/26/21 0335 06/27/21 0020 06/30/21 0651  HGB 10.0* 11.2* 10.9* 11.0* 10.8*    -Per nephrology   CAD/PVD/bilateral BKA-uses prosthetic legs -Stop Crestor in the setting of rhabdomyolysis.  She is ESRD patient as well -Continue aspirin and Plavix -PT/OT at SNF.   Mild rhabdomyolysis: CK elevated to 860>> 391.   Chronic diastolic CHF: TTE 0/9735 with LVEF of 65 to 32%, diastolic dysfunction.  Appears euvolemic with no cardiopulmonary symptoms.  Persistently elevated BNP> 4500 suggest this poor prognosis.  -Fluid management by HD.   Essential HTN.  SBP in 130s and 140s.  DBP in 40s and 50s. -Continue blood pressure control with amlodipine and nebivolol.    Controlled DM-2 with ESRD: A1c 5.4%. -Discontinued CBG monitoring and SSI -Discontinued diabetic meds.   Physical deconditioning/bilateral BKA/fall at home: Patient's sister, [name] concerned about patient being at home alone.  Per [name], she slid off the bed recently and was not able to get back on.  -Therapy recommends SNF which is appropriate.  Body mass index is 21.57 kg/m.           Discharge Exam: Vitals:   06/30/21 1114 06/30/21 2026 07/01/21 0120 07/01/21 0313  BP: [name])  147/63 (!) 144/68  (!) 131/56  Pulse:  70  89  Temp:  98.5 F (36.9 C)  97.6 F (36.4 C)  Resp: '20 18  18  ' Height:      Weight:   50.1 kg   SpO2:  99%  100%  TempSrc:  Oral  Oral  BMI (Calculated):   21.57       GENERAL: Frail looking elderly female.  No apparent distress. HEENT: MMM.  Vision and hearing grossly intact.  NECK: Supple.  No apparent JVD.  RESP: 100% on RA.  No IWOB.  Fair aeration bilaterally. CVS:  RRR. Heart sounds normal.  ABD/GI/GU: Bowel sounds present. Soft. Non tender.  MSK/EXT:  Moves extremities.  Bilateral BKA. SKIN: no apparent skin lesion or wound NEURO: Awake and alert.  Oriented x4 except date.  No apparent focal neuro deficit. PSYCH: Calm. Normal affect.   Discharge Instructions  Discharge Instructions     Diet - low sodium heart healthy   Complete by: As directed    With fluid restriction to less than 1200 cc a day   Increase activity slowly   Complete by: As directed       Allergies as of 07/01/2021       Reactions   Oxycodone Itching        Medication List     STOP taking these medications    Accu-Chek Guide Me w/Device Kit   Accu-Chek Guide test strip Generic drug: glucose blood   Accu-Chek Softclix Lancets lancets   doxycycline 100 MG tablet Commonly known as: VIBRA-TABS   FLUZONE HIGH-DOSE QUADRIVALENT IM   glipiZIDE 10 MG 24 hr tablet Commonly known as: GLUCOTROL XL   Januvia 100 MG tablet Generic drug: sitaGLIPtin   rosuvastatin 40 MG tablet Commonly known as: CRESTOR       TAKE these medications    acetaminophen 500 MG tablet Commonly known as: TYLENOL Take 500 mg by mouth every 6 (six) hours as needed for mild pain.   albuterol 108 (90 Base) MCG/ACT inhaler Commonly known as: VENTOLIN HFA Inhale 2 puffs into the lungs every 6 (six) hours as needed for wheezing or shortness of breath.   Alphagan P 0.1 % Soln Generic drug: brimonidine Place 1 drop into both eyes 3 (three) times daily.   amLODipine 10 MG tablet Commonly known as: NORVASC TAKE 1 TABLET BY MOUTH EVERYDAY AT BEDTIME What changed: See the new instructions.   aspirin 81 MG EC tablet Take 81 mg by mouth daily.   cinacalcet 30 MG  tablet Commonly known as: SENSIPAR Take 1 tablet (30 mg total) by mouth every Monday, Wednesday, and Friday at 6 PM.   clopidogrel 75 MG tablet Commonly known as: PLAVIX Take 1 tablet (75 mg total) by mouth daily.   gabapentin 100 MG capsule Commonly known as: NEURONTIN Take 1 capsule (100 mg total) by mouth at bedtime. What changed: See the new instructions.   loperamide 2 MG capsule Commonly known as: IMODIUM Take 1 capsule (2 mg total) by mouth every 4 (four) hours as needed for diarrhea or loose stools.   MIRCERA IJ Mircera   multivitamin Tabs tablet TAKE 1 TABLET BY MOUTH EVERYDAY AT BEDTIME What changed: See the new instructions.   nebivolol 5 MG tablet Commonly known as: Bystolic Take 1 tablet (5 mg total) by mouth daily.   ondansetron 4 MG tablet Commonly known as: ZOFRAN Take 1 tablet (4 mg total) by mouth every 8 (eight) hours as needed for nausea.  pantoprazole 40 MG tablet Commonly known as: PROTONIX TAKE 1 TABLET BY MOUTH TWICE A DAY What changed: when to take this   psyllium 95 % Pack Commonly known as: HYDROCIL/METAMUCIL Take 1 packet by mouth daily.   Restasis 0.05 % ophthalmic emulsion Generic drug: cycloSPORINE Place 1 drop into both eyes 2 (two) times daily.   Velphoro 500 MG chewable tablet Generic drug: sucroferric oxyhydroxide Chew 500 mg by mouth 2 (two) times daily.        Consultations: Nephrology  Procedures/Studies: Hemodialysis   CT Chest Wo Contrast  Result Date: 06/25/2021 CLINICAL DATA:  Pneumonia.  Weakness. EXAM: CT CHEST WITHOUT CONTRAST TECHNIQUE: Multidetector CT imaging of the chest was performed following the standard protocol without IV contrast. COMPARISON:  CT abdomen and pelvis 06/25/2021. Chest x-ray 06/25/2021. FINDINGS: Cardiovascular: The heart is mildly enlarged. Aorta is normal in size. There are atherosclerotic calcifications of the aorta and coronary arteries. There is no pericardial effusion. Left  axillary vascular stent is partially visualized. Mediastinum/Nodes: There are nonenlarged paratracheal and prevascular lymph nodes. There is a mildly enlarged subcarinal lymph node measuring 11 mm short axis. Visualized thyroid gland and esophagus are within normal limits. Lungs/Pleura: There is a small amount of atelectasis in the bilateral lower lobes and lingula. There is a calcified granuloma in the right lower lobe. There is a small right pleural effusion and trace left pleural effusion. There is no pneumothorax. Trachea and central airways are patent. Upper Abdomen: Gallstones are present. There are hypodensities in the kidneys which are too small to characterize, likely cysts. Musculoskeletal: There is diffuse body wall edema. Calcifications are seen in the bilateral breasts. There is skin thickening of the anterior and posterior chest wall. There are severe/end-stage degenerative changes of the right shoulder. No acute fractures are seen. IMPRESSION: 1. Small bilateral pleural effusions 2. Bilateral lower lobe and lingular atelectasis. 3. Cardiomegaly. 4. Mildly enlarged subcarinal lymph node. 5. Diffuse body wall edema. 6. Posterior and anterior chest wall skin thickening. Correlate for infection. 7. Cholelithiasis. Aortic Atherosclerosis (ICD10-I70.0). Electronically Signed   By: Ronney Asters M.D.   On: 06/25/2021 15:14   NM Hepatobiliary Liver Func  Result Date: 06/26/2021 CLINICAL DATA:  Abdominal pain, upper, chronic, assess gallbladder motility EXAM: NUCLEAR MEDICINE HEPATOBILIARY IMAGING TECHNIQUE: Sequential images of the abdomen were obtained out to 60 minutes following intravenous administration of radiopharmaceutical. RADIOPHARMACEUTICALS:  5.2 mCi Tc-73m Choletec IV COMPARISON:  Ultrasound and CT 06/25/2021 FINDINGS: Prompt uptake and biliary excretion of activity by the liver is seen. Gallbladder activity is visualized, consistent with patency of cystic duct. Biliary activity passes into  small bowel, consistent with patent common bile duct. IMPRESSION: Negative hepatobiliary scan. No scintigraphic evidence of acute cholecystitis. Electronically Signed   By: NDavina PokeD.O.   On: 06/26/2021 15:40   CT Abdomen Pelvis W Contrast  Result Date: 06/25/2021 CLINICAL DATA:  Diffuse abdominal pain infection suspected. EXAM: CT ABDOMEN AND PELVIS WITH CONTRAST TECHNIQUE: Multidetector CT imaging of the abdomen and pelvis was performed using the standard protocol following bolus administration of intravenous contrast. CONTRAST:  1064mOMNIPAQUE IOHEXOL 300 MG/ML  SOLN COMPARISON:  July 09, 2011 CT and March 12, 2020 lumbar spine radiograph. FINDINGS: Lower chest: Small right greater than left pleural effusions with adjacent atelectasis. Hepatobiliary: No suspicious hepatic lesion. Cholelithiasis in a distended gallbladder. No biliary ductal dilation. Pancreas: No pancreatic ductal dilation or evidence of acute inflammation Spleen: Within normal limits. Adrenals/Urinary Tract: Bilateral adrenal glands are unremarkable. No hydronephrosis. Atrophic  kidneys. Hypodense 1.2 cm left renal cyst. Subcentimeter hypodense bilateral renal lesions technically too small to accurately characterize but statistically likely represent cysts. Urinary bladder is decompressed. Stomach/Bowel: Stomach is unremarkable for degree of distension. No pathologic dilation of small or large bowel. The appendix and terminal ileum appear normal. Sigmoid colonic diverticulosis without findings of acute diverticulitis. Vascular/Lymphatic: Aortic and branch vessel atherosclerosis without abdominal aortic aneurysm. No pathologically enlarged abdominal or pelvic lymph nodes. Reproductive: Calcified uterine leiomyomas. No suspicious adnexal mass. Other: No significant abdominopelvic free fluid. Diffuse subcutaneous edema. Musculoskeletal: Vacuum disc artifact with endplate sclerosis and remodeling at L4-L5 similar to prior radiograph  March 12, 2020. L5-S1 discogenic disease. IMPRESSION: 1. Cholelithiasis in a distended gallbladder, without other CT findings of cholecystitis. Consider further evaluation with right upper quadrant ultrasound if clinically indicated. 2. Small right greater than left pleural effusions with adjacent atelectasis. 3. Sigmoid colonic diverticulosis without findings of acute diverticulitis. 4. Aortic Atherosclerosis (ICD10-I70.0). Electronically Signed   By: Dahlia Bailiff M.D.   On: 06/25/2021 13:39   US Abdomen Limited  Result Date: 06/25/2021 CLINICAL DATA:  CT shows gallstones and distended gallbladder EXAM: ULTRASOUND ABDOMEN LIMITED RIGHT UPPER QUADRANT COMPARISON:  CT June 25, 2021. FINDINGS: Gallbladder: Multiple gallstones, measuring up to 1.6 cm. Layering sludge within the gallbladder. No sonographic Murphy sign per technologist. Gallbladder wall thickening, measuring 3.9 mm. Common bile duct: Diameter: 6.7 mm, dilated. Liver:Heterogeneous echotexture, widened fissures and lobular cortex. No focal lesion identified. Portal vein is patent on color Doppler imaging with normal direction of blood flow towards the liver. Other: Echogenic partially imaged right kidney IMPRESSION: 1. Distended gallbladder with gallbladder wall thickening and multiple large gallstones and sludge. No sonographic Murphy sign per the technologist. Findings are indeterminate for acute cholecystitis. HIDA scan could further evaluate if clinically uncertain. 2. Dilated common bile duct, measuring up to 6.7 mm. Recommend correlation with liver functions to exclude obstruction. MRCP could further characterize if clinically indicated. 3. Heterogeneous echotexture of the liver with widened fissures and lobular cortex of the liver. Question liver disease / developing cirrhosis. Recommend clinical correlation. 4. Echogenic partially imaged right kidney, possibly related to chronic kidney disease. Electronically Signed   By: Margaretha Sheffield  M.D.   On: 06/25/2021 16:52   DG Chest Port 1 View  Result Date: 06/25/2021 CLINICAL DATA:  Weakness diarrhea dialysis patient EXAM: PORTABLE CHEST 1 VIEW COMPARISON:  03/12/2020. FINDINGS: Enlarged cardiac silhouette. Streaky left basilar opacities. No visible pneumothorax. Probable small left pleural effusion. Severe right shoulder degenerative change. IMPRESSION: 1. Streaky left basilar opacities, favor atelectasis. Infection or aspiration is not excluded. 2. Probable small left pleural effusion. 3. Cardiomegaly. Electronically Signed   By: Margaretha Sheffield M.D.   On: 06/25/2021 13:08       The results of significant diagnostics from this hospitalization (including imaging, microbiology, ancillary and laboratory) are listed below for reference.     Microbiology: Recent Results (from the past 240 hour(s))  Resp Panel by RT-PCR (Flu A&B, Covid) Nasopharyngeal Swab     Status: None   Collection Time: 06/25/21  6:28 PM   Specimen: Nasopharyngeal Swab; Nasopharyngeal(NP) swabs in vial transport medium  Result Value Ref Range Status   SARS Coronavirus 2 by RT PCR NEGATIVE NEGATIVE Final    Comment: (NOTE) SARS-CoV-2 target nucleic acids are NOT DETECTED.  The SARS-CoV-2 RNA is generally detectable in upper respiratory specimens during the acute phase of infection. The lowest concentration of SARS-CoV-2 viral copies this assay can detect is 138  copies/mL. A negative result does not preclude SARS-Cov-2 infection and should not be used as the sole basis for treatment or other patient management decisions. A negative result may occur with  improper specimen collection/handling, submission of specimen other than nasopharyngeal swab, presence of viral mutation(s) within the areas targeted by this assay, and inadequate number of viral copies(<138 copies/mL). A negative result must be combined with clinical observations, patient history, and epidemiological information. The expected result is  Negative.  Fact Sheet for Patients:  EntrepreneurPulse.com.au  Fact Sheet for Healthcare Providers:  IncredibleEmployment.be  This test is no t yet approved or cleared by the [place] FDA and  has been authorized for detection and/or diagnosis of SARS-CoV-2 by FDA under an Emergency Use Authorization (EUA). This EUA will remain  in effect (meaning this test can be used) for the duration of the COVID-19 declaration under Section 564(b)(1) of the Act, 21 U.S.C.section 360bbb-3(b)(1), unless the authorization is terminated  or revoked sooner.       Influenza A by PCR NEGATIVE NEGATIVE Final   Influenza B by PCR NEGATIVE NEGATIVE Final    Comment: (NOTE) The Xpert Xpress SARS-CoV-2/FLU/RSV plus assay is intended as an aid in the diagnosis of influenza from Nasopharyngeal swab specimens and should not be used as a sole basis for treatment. Nasal washings and aspirates are unacceptable for Xpert Xpress SARS-CoV-2/FLU/RSV testing.  Fact Sheet for Patients: EntrepreneurPulse.com.au  Fact Sheet for Healthcare Providers: IncredibleEmployment.be  This test is not yet approved or cleared by the [place] FDA and has been authorized for detection and/or diagnosis of SARS-CoV-2 by FDA under an Emergency Use Authorization (EUA). This EUA will remain in effect (meaning this test can be used) for the duration of the COVID-19 declaration under Section 564(b)(1) of the Act, 21 U.S.C. section 360bbb-3(b)(1), unless the authorization is terminated or revoked.  Performed at [facility], [place] [address], [place], [place] [address]   C Difficile Quick Screen w PCR reflex     Status: None   Collection Time: 06/27/21 10:03 PM   Specimen: STOOL  Result Value Ref Range Status   C Diff antigen NEGATIVE NEGATIVE Final   C Diff toxin NEGATIVE NEGATIVE Final   C Diff interpretation No C. difficile detected.  Final     Comment: Performed at [facility], [place] [address] [place], [place] [address]  Gastrointestinal Panel by PCR , Stool     Status: None   Collection Time: 06/27/21 10:03 PM   Specimen: STOOL  Result Value Ref Range Status   Campylobacter species NOT DETECTED NOT DETECTED Final   Plesimonas shigelloides NOT DETECTED NOT DETECTED Final   Salmonella species NOT DETECTED NOT DETECTED Final   Yersinia enterocolitica NOT DETECTED NOT DETECTED Final   Vibrio species NOT DETECTED NOT DETECTED Final   Vibrio cholerae NOT DETECTED NOT DETECTED Final   Enteroaggregative E coli (EAEC) NOT DETECTED NOT DETECTED Final   Enteropathogenic E coli (EPEC) NOT DETECTED NOT DETECTED Final   Enterotoxigenic E coli (ETEC) NOT DETECTED NOT DETECTED Final   Shiga like toxin producing E coli (STEC) NOT DETECTED NOT DETECTED Final   Shigella/Enteroinvasive E coli (EIEC) NOT DETECTED NOT DETECTED Final   Cryptosporidium NOT DETECTED NOT DETECTED Final   Cyclospora cayetanensis NOT DETECTED NOT DETECTED Final   Entamoeba histolytica NOT DETECTED NOT DETECTED Final   Giardia lamblia NOT DETECTED NOT DETECTED Final   Adenovirus F40/41 NOT DETECTED NOT DETECTED Final   Astrovirus NOT DETECTED NOT  DETECTED Final   Norovirus GI/GII NOT DETECTED NOT DETECTED Final   Rotavirus A NOT DETECTED NOT DETECTED Final   Sapovirus (I, II, IV, and V) NOT DETECTED NOT DETECTED Final    Comment: Performed at [facility], [address] [place], [place] [address]  Resp Panel by RT-PCR (Flu A&B, Covid) Nasopharyngeal Swab     Status: None   Collection Time: 06/30/21  5:03 PM   Specimen: Nasopharyngeal Swab; Nasopharyngeal(NP) swabs in vial transport medium  Result Value Ref Range Status   SARS Coronavirus 2 by RT PCR NEGATIVE NEGATIVE Final    Comment: (NOTE) SARS-CoV-2 target nucleic acids are NOT DETECTED.  The SARS-CoV-2 RNA is generally detectable in upper respiratory specimens during the acute phase of  infection. The lowest concentration of SARS-CoV-2 viral copies this assay can detect is 138 copies/mL. A negative result does not preclude SARS-Cov-2 infection and should not be used as the sole basis for treatment or other patient management decisions. A negative result may occur with  improper specimen collection/handling, submission of specimen other than nasopharyngeal swab, presence of viral mutation(s) within the areas targeted by this assay, and inadequate number of viral copies(<138 copies/mL). A negative result must be combined with clinical observations, patient history, and epidemiological information. The expected result is Negative.  Fact Sheet for Patients:  EntrepreneurPulse.com.au  Fact Sheet for Healthcare Providers:  IncredibleEmployment.be  This test is no t yet approved or cleared by the [place] FDA and  has been authorized for detection and/or diagnosis of SARS-CoV-2 by FDA under an Emergency Use Authorization (EUA). This EUA will remain  in effect (meaning this test can be used) for the duration of the COVID-19 declaration under Section 564(b)(1) of the Act, 21 U.S.C.section 360bbb-3(b)(1), unless the authorization is terminated  or revoked sooner.       Influenza A by PCR NEGATIVE NEGATIVE Final   Influenza B by PCR NEGATIVE NEGATIVE Final    Comment: (NOTE) The Xpert Xpress SARS-CoV-2/FLU/RSV plus assay is intended as an aid in the diagnosis of influenza from Nasopharyngeal swab specimens and should not be used as a sole basis for treatment. Nasal washings and aspirates are unacceptable for Xpert Xpress SARS-CoV-2/FLU/RSV testing.  Fact Sheet for Patients: EntrepreneurPulse.com.au  Fact Sheet for Healthcare Providers: IncredibleEmployment.be  This test is not yet approved or cleared by the [place] FDA and has been authorized for detection and/or diagnosis of SARS-CoV-2  by FDA under an Emergency Use Authorization (EUA). This EUA will remain in effect (meaning this test can be used) for the duration of the COVID-19 declaration under Section 564(b)(1) of the Act, 21 U.S.C. section 360bbb-3(b)(1), unless the authorization is terminated or revoked.  Performed at [facility], [place] [address], [place], [place] [address]      Labs:  CBC: Recent Labs  Lab 06/25/21 [phone] 06/26/21 0335 06/27/21 0020 06/30/21 0651  WBC 5.3 5.4 6.5 4.3  NEUTROABS 3.0 2.6  --   --   HGB 11.2* 10.9* 11.0* 10.8*  HCT 33.4* 31.8* 32.6* 31.3*  MCV 90.5 90.1 89.6 90.7  PLT 95* 71* 102* 86*   BMP &GFR Recent Labs  Lab 06/25/21 0835 06/25/21 1659 06/26/21 0335 06/27/21 0415 06/28/21 0433 06/29/21 0716 06/30/21 0338 06/30/21 0651  NA 136   < > 132* 136 136 134* 131* 132*  K 3.3*   < > 3.5 3.7 5.5* 3.4* 3.5 3.4*  CL 92*   < > 93* 98 98 98 97* 95*  CO2  30   < > '25 26 23 25 26 29  ' GLUCOSE 77   < > 50* 93 94 112* 250* 241*  BUN 8   < > 12 7* 12 5* 10 11  CREATININE 4.84*   < > 5.72* 3.66* 4.81* 2.88* 3.78* 4.03*  CALCIUM 9.9   < > 9.6 9.3 9.8 9.3 9.6 9.6  MG 1.8  --  1.9  --  1.9 1.7 1.8  --   PHOS  --   --  4.0  --  2.9 1.9* 2.4* 2.5   < > = values in this interval not displayed.   Estimated Creatinine Clearance: 9.5 mL/min (A) (by C-G formula based on SCr of 4.03 mg/dL (H)). Liver & Pancreas: Recent Labs  Lab 06/25/21 0835 06/25/21 1659 06/26/21 0335 06/28/21 0433 06/29/21 0716 06/30/21 0338 06/30/21 0651  AST 91* 86* 76*  --   --   --   --   ALT 32 33 31  --   --   --   --   ALKPHOS 124 126 111  --   --   --   --   BILITOT 1.2 1.1 0.9  --   --   --   --   PROT 5.4* 5.2* 5.1*  --   --   --   --   ALBUMIN 2.4* 2.2* 2.2* 2.5* 2.3* 2.2* 2.2*   Recent Labs  Lab 06/25/21 0835 06/25/21 1659 06/28/21 0433  LIPASE '31 29 23   ' No results for input(s): AMMONIA in the last 168 hours. Diabetic: No results for input(s): HGBA1C in the last 72  hours. Recent Labs  Lab 06/27/21 1710 06/27/21 1955 06/28/21 0032 06/28/21 0441 06/28/21 2011  GLUCAP 92 123* 96 97 139*   Cardiac Enzymes: Recent Labs  Lab 06/28/21 0433 06/29/21 0716 06/30/21 0338  CKTOTAL 860* 853* 391*   No results for input(s): PROBNP in the last 8760 hours. Coagulation Profile: No results for input(s): INR, PROTIME in the last 168 hours. Thyroid Function Tests: No results for input(s): TSH, T4TOTAL, FREET4, T3FREE, THYROIDAB in the last 72 hours. Lipid Profile: No results for input(s): CHOL, HDL, LDLCALC, TRIG, CHOLHDL, LDLDIRECT in the last 72 hours. Anemia Panel: No results for input(s): VITAMINB12, FOLATE, FERRITIN, TIBC, IRON, RETICCTPCT in the last 72 hours. Urine analysis:    Component Value Date/Time   COLORURINE YELLOW 09/15/2016 1300   APPEARANCEUR CLEAR 09/15/2016 1300   LABSPEC 1.010 09/15/2016 1300   PHURINE 7.5 09/15/2016 1300   GLUCOSEU 250 (A) 09/15/2016 1300   HGBUR SMALL (A) 09/15/2016 1300   BILIRUBINUR NEGATIVE 09/15/2016 1300   KETONESUR NEGATIVE 09/15/2016 1300   PROTEINUR >300 (A) 03/19/2016 0206   UROBILINOGEN 0.2 09/15/2016 1300   NITRITE NEGATIVE 09/15/2016 1300   LEUKOCYTESUR NEGATIVE 09/15/2016 1300   Sepsis Labs: Invalid input(s): PROCALCITONIN, LACTICIDVEN   Time coordinating discharge: 45 minutes  SIGNED:  Mercy Riding, MD  [facility] 07/01/2021, 7:11 AM

## 2021-07-01 NOTE — TOC Progression Note (Signed)
Transition of Care [facility]) - Progression Note    Patient Details  Name: Carolyn Valenzuela MRN: [id number] Date of Birth: 01/19/1951  Transition of Care [facility]) CM/SW Contact  Reece Agar, [place] Phone Number: 07/01/2021, 10:11 AM  Clinical Narrative:    CSW spoke with Nav[employer] representative about pt SNF for auth, CSW updated Navi about [employer] as pt/family choice for SNF.    Expected Discharge Plan: [place] Barriers to Discharge: Continued Medical Work up, SNF Pending bed offer  Expected Discharge Plan and Services Expected Discharge Plan: [place] Choice: [place] arrangements for the past 2 months: Apartment Expected Discharge Date: 07/01/21                                     Social Determinants of Health (SDOH) Interventions    Readmission Risk Interventions No flowsheet data found.

## 2021-07-01 NOTE — Progress Notes (Signed)
Report received and care assumed from previous shift RN. VS obtained, shift assessments completed - see flowsheets. Denies pain. Turns and repositions self in bed. Currently resting in bed, bed in lowest position. Denies needs. Call bell within reach. Bedalarm in use at all times.

## 2021-07-01 NOTE — Progress Notes (Signed)
Occupational Therapy Treatment Patient Details Name: Carolyn Valenzuela MRN: [id number] DOB: 09-26-1950 Today's Date: 07/01/2021   History of present illness Pt adm 10/26 with abdominal pain and diarrhea. CT showed cholelithiasis. PMH - ESRD on HD, Bil BKA, PAD, DM, HTN, CAD.   OT comments  Pt progressing gradually towards OT goals. Pt with continued sitting balance deficits while managing B prosthetic Les EOB though noted improving with increased activity. Pt with more anxiety in attempting mobility during ADLs this AM with pt only feeling confident to complete pivot transfers this AM. Pt reports family unable to provide increased support at DC as they originally thought, so updated DC recs to SNF rehab prior to return home to maximize overall independence.    Recommendations for follow up therapy are one component of a multi-disciplinary discharge planning process, led by the attending physician.  Recommendations may be updated based on patient status, additional functional criteria and insurance authorization.    Follow Up Recommendations  Skilled nursing-short term rehab (<3 hours/day)    Assistance Recommended at Discharge Intermittent Supervision/Assistance  Equipment Recommendations  Other (comment) (may benefit from bedrail at home)    Recommendations for Other Services Other (comment) ([place] aide)    Precautions / Restrictions Precautions Precautions: Fall Precaution Comments: B BKA with prosthetics in room, socks are missing for one Restrictions Weight Bearing Restrictions: No       Mobility Bed Mobility Overal bed mobility: Needs Assistance Bed Mobility: Supine to Sit     Supine to sit: Supervision;HOB elevated     General bed mobility comments: Supervision with increased time, minor cues for problem solving with pt reaching to bedrails    Transfers Overall transfer level: Needs assistance Equipment used: Rolling walker (2 wheels);1 person hand held assist Transfers:  Sit to/from Stand Sit to Stand: Min assist           General transfer comment: Initially Mod A progressing to min guard for power up from bedside using RW. cues for hand placement and problem solving     Balance Overall balance assessment: Needs assistance;History of Falls Sitting-balance support: Feet supported;Bilateral upper extremity supported Sitting balance-Leahy Scale: Fair Sitting balance - Comments: initial posterior LOB sitting EOB during dynamic tasks but improved with increased activity   Standing balance support: Bilateral upper extremity supported;During functional activity Standing balance-Leahy Scale: Poor Standing balance comment: reliant on RW in standing                           ADL either performed or assessed with clinical judgement   ADL Overall ADL's : Needs assistance/impaired                     Lower Body Dressing: Moderate assistance;Sit to/from stand;Sitting/lateral leans Lower Body Dressing Details (indicate cue type and reason): Will require assist in standing to ensure balance when donning over waist. Able to demo B prosthetic mgmt with Min A. (assist to click in prosthetics and cues for sequencing/problem solving               General ADL Comments: Pt with noted initial sitting balance deficits with prosthetic LE mgmt that improved with increased activity. Planned to walk to sink for ADLs and/or around room to recliner but pt with increased anxiety and did not feel comfortable with mobility today     Vision   Vision Assessment?: Vision impaired- to be further tested in functional context Additional Comments: hx of glaucoma,  macular degeneration per family/pt. has glasses this session that improved functional abilities   Perception     Praxis      Cognition Arousal/Alertness: Awake/alert Behavior During Therapy: WFL for tasks assessed/performed;Impulsive Overall Cognitive Status: Impaired/Different from baseline Area  of Impairment: Awareness;Problem solving;Safety/judgement;Following commands;Memory;Attention                   Current Attention Level: Selective Memory: Decreased short-term memory Following Commands: Follows one step commands with increased time Safety/Judgement: Decreased awareness of safety;Decreased awareness of deficits Awareness: Emergent Problem Solving: Slow processing;Requires verbal cues General Comments: Pt can be easily distracted, some cues needed for problem solving and safety but improving awareness of deficits and needs          Exercises     Shoulder Instructions       General Comments      Pertinent Vitals/ Pain       Pain Assessment: Faces Faces Pain Scale: Hurts a little bit Pain Location: R shoulder Pain Descriptors / Indicators: Grimacing;Guarding;Sore Pain Intervention(s): Monitored during session  Home Living                                          Prior Functioning/Environment              Frequency  Min 2X/week        Progress Toward Goals  OT Goals(current goals can now be found in the care plan section)  Progress towards OT goals: Progressing toward goals  Acute Rehab OT Goals Patient Stated Goal: be able to go home soon, return to independence OT Goal Formulation: With patient Time For Goal Achievement: 07/10/21 Potential to Achieve Goals: Good ADL Goals Pt Will Perform Lower Body Bathing: with set-up;sitting/lateral leans;sit to/from stand Pt Will Perform Lower Body Dressing: with set-up;sitting/lateral leans;sit to/from stand Pt Will Transfer to Toilet: with supervision;ambulating Pt Will Perform Tub/Shower Transfer: with supervision;ambulating;tub bench;rolling walker Pt/caregiver will Perform Home Exercise Program: Increased strength;Both right and left upper extremity;With theraband;Independently;With written HEP provided  Plan Discharge plan needs to be updated    Co-evaluation                  AM-PAC OT "6 Clicks" Daily Activity     Outcome Measure   Help from another person eating meals?: None Help from another person taking care of personal grooming?: A Little Help from another person toileting, which includes using toliet, bedpan, or urinal?: A Lot Help from another person bathing (including washing, rinsing, drying)?: A Little Help from another person to put on and taking off regular upper body clothing?: A Little Help from another person to put on and taking off regular lower body clothing?: A Lot 6 Click Score: 17    End of Session Equipment Utilized During Treatment: Gait belt;Rolling walker (2 wheels)  OT Visit Diagnosis: Unsteadiness on feet (R26.81);Other abnormalities of gait and mobility (R26.89);Muscle weakness (generalized) (M62.81)   Activity Tolerance Patient tolerated treatment well   Patient Left in chair;with call bell/phone within reach;with chair alarm set   Nurse Communication Mobility status;Other (comment) (desire for bathing task)        Time: 2831-5176 OT Time Calculation (min): 38 min  Charges: OT General Charges $OT Visit: 1 Visit OT Treatments $Self Care/Home Management : 23-37 mins $Therapeutic Activity: 8-22 mins  Malachy Chamber, OTR/L Acute Rehab Services Office: [phone]   Almyra Free  Napoleon Monacelli 07/01/2021, 10:54 AM

## 2021-07-01 NOTE — Progress Notes (Signed)
Physical Therapy Treatment Patient Details Name: Carolyn Valenzuela MRN: [id number] DOB: June 10, 1951 Today's Date: 07/01/2021   History of Present Illness Pt adm 10/26 with abdominal pain and diarrhea. CT showed cholelithiasis. PMH - ESRD on HD, Bil BKA, PAD, DM, HTN, CAD.    PT Comments    Pt sitting up in recliner, agreeable to walking with therapy. Pt limited in safe mobility by decreased cognition, in presence of decreased strength, balance and endurance. Pt is mod A for power up and gaining balance over prosthesis. Once up pt able to ambulate to door, rest in chair, walk back to [facility] and then back to recliner with minA.  Pt looking forward to going to SNF before going home.     Recommendations for follow up therapy are one component of a multi-disciplinary discharge planning process, led by the attending physician.  Recommendations may be updated based on patient status, additional functional criteria and insurance authorization.  Follow Up Recommendations  Skilled nursing-short term rehab (<3 hours/day)     Assistance Recommended at Discharge Frequent or constant Supervision/Assistance  Equipment Recommendations  Wheelchair (measurements PT);None recommended by PT       Precautions / Restrictions Precautions Precautions: Fall Precaution Comments: B BKA with prosthetics in room, socks are missing for one     Mobility  Bed Mobility Overal bed mobility: Needs Assistance Bed Mobility: Supine to Sit     Supine to sit: Supervision;HOB elevated     General bed mobility comments: up in recliner    Transfers Overall transfer level: Needs assistance Equipment used: Rolling walker (2 wheels);1 person hand held assist Transfers: Sit to/from Stand Sit to Stand: Min assist;Mod assist           General transfer comment: initial sit>stand requiring modA for getting CoG over BoS, min A for additional stand from chair and [facility]    Ambulation/Gait Ambulation/Gait assistance: Min  assist Gait Distance (Feet): 12 Feet (+8+4) Assistive device: Rolling walker (2 wheels) Gait Pattern/deviations: Step-through pattern;Wide base of support;Decreased stride length;Step-to pattern Gait velocity: decreased Gait velocity interpretation: <1.31 ft/sec, indicative of household ambulator General Gait Details: slow, shuffling gait from recliner to chair at door, back to [facility] and then back to recliner, min A for steadying and vc for sequencing and navigation around obstacles.         Balance Overall balance assessment: Needs assistance;History of Falls Sitting-balance support: Feet supported;Bilateral upper extremity supported Sitting balance-Leahy Scale: Fair Sitting balance - Comments: initial posterior LOB sitting EOB during dynamic tasks but improved with increased activity   Standing balance support: Bilateral upper extremity supported;During functional activity Standing balance-Leahy Scale: Poor Standing balance comment: reliant on RW in standing                            Cognition Arousal/Alertness: Awake/alert Behavior During Therapy: WFL for tasks assessed/performed;Impulsive Overall Cognitive Status: Impaired/Different from baseline Area of Impairment: Awareness;Problem solving;Safety/judgement;Following commands;Memory;Attention                   Current Attention Level: Selective Memory: Decreased short-term memory Following Commands: Follows one step commands with increased time Safety/Judgement: Decreased awareness of safety;Decreased awareness of deficits Awareness: Emergent Problem Solving: Slow processing;Requires verbal cues General Comments: continues to need vc for safety awareness and sequencing               Pertinent Vitals/Pain Pain Assessment: Faces Faces Pain Scale: Hurts a little bit Pain Location: R shoulder  Pain Descriptors / Indicators: Grimacing;Guarding;Sore Pain Intervention(s): Limited activity within patient's  tolerance;Monitored during session;Repositioned     PT Goals (current goals can now be found in the care plan section) Acute Rehab PT Goals PT Goal Formulation: With patient Time For Goal Achievement: 07/11/21 Potential to Achieve Goals: Good Progress towards PT goals: Progressing toward goals    Frequency    Min 2X/week      PT Plan Current plan remains appropriate       AM-PAC PT "6 Clicks" Mobility   Outcome Measure  Help needed turning from your back to your side while in a flat bed without using bedrails?: A Little Help needed moving from lying on your back to sitting on the side of a flat bed without using bedrails?: A Lot Help needed moving to and from a bed to a chair (including a wheelchair)?: A Lot Help needed standing up from a chair using your arms (e.g., wheelchair or bedside chair)?: A Little Help needed to walk in hospital room?: A Little Help needed climbing 3-5 steps with a railing? : A Little 6 Click Score: 16    End of Session Equipment Utilized During Treatment: Gait belt Activity Tolerance: Patient limited by fatigue Patient left: in chair;with call bell/phone within reach;with chair alarm set;with nursing/sitter in room Nurse Communication: Mobility status PT Visit Diagnosis: Unsteadiness on feet (R26.81);Muscle weakness (generalized) (M62.81);History of falling (Z91.81);Difficulty in walking, not elsewhere classified (R26.2);Pain Pain - Right/Left: Right Pain - part of body: Leg     Time: 8592-9244 PT Time Calculation (min) (ACUTE ONLY): 38 min  Charges:  $Gait Training: 8-22 mins $Therapeutic Exercise: 8-22 mins $Self Care/Home Management: 8-22                     Raeanne Deschler B. Migdalia Dk PT, DPT Acute Rehabilitation Services Pager [phone] Office [phone]    [place] 07/01/2021, 2:34 PM

## 2021-07-02 DIAGNOSIS — K8020 Calculus of gallbladder without cholecystitis without obstruction: Secondary | ICD-10-CM | POA: Diagnosis not present

## 2021-07-02 DIAGNOSIS — N186 End stage renal disease: Secondary | ICD-10-CM | POA: Diagnosis not present

## 2021-07-02 DIAGNOSIS — R109 Unspecified abdominal pain: Secondary | ICD-10-CM | POA: Diagnosis not present

## 2021-07-02 DIAGNOSIS — D649 Anemia, unspecified: Secondary | ICD-10-CM | POA: Diagnosis not present

## 2021-07-02 DIAGNOSIS — N2581 Secondary hyperparathyroidism of renal origin: Secondary | ICD-10-CM | POA: Diagnosis not present

## 2021-07-02 DIAGNOSIS — Z992 Dependence on renal dialysis: Secondary | ICD-10-CM | POA: Diagnosis not present

## 2021-07-04 DIAGNOSIS — N2581 Secondary hyperparathyroidism of renal origin: Secondary | ICD-10-CM | POA: Diagnosis not present

## 2021-07-04 DIAGNOSIS — Z992 Dependence on renal dialysis: Secondary | ICD-10-CM | POA: Diagnosis not present

## 2021-07-04 DIAGNOSIS — N186 End stage renal disease: Secondary | ICD-10-CM | POA: Diagnosis not present

## 2021-07-07 DIAGNOSIS — N2581 Secondary hyperparathyroidism of renal origin: Secondary | ICD-10-CM | POA: Diagnosis not present

## 2021-07-07 DIAGNOSIS — N186 End stage renal disease: Secondary | ICD-10-CM | POA: Diagnosis not present

## 2021-07-07 DIAGNOSIS — Z992 Dependence on renal dialysis: Secondary | ICD-10-CM | POA: Diagnosis not present

## 2021-07-09 DIAGNOSIS — Z992 Dependence on renal dialysis: Secondary | ICD-10-CM | POA: Diagnosis not present

## 2021-07-09 DIAGNOSIS — N2581 Secondary hyperparathyroidism of renal origin: Secondary | ICD-10-CM | POA: Diagnosis not present

## 2021-07-09 DIAGNOSIS — N186 End stage renal disease: Secondary | ICD-10-CM | POA: Diagnosis not present

## 2021-07-11 DIAGNOSIS — N2581 Secondary hyperparathyroidism of renal origin: Secondary | ICD-10-CM | POA: Diagnosis not present

## 2021-07-11 DIAGNOSIS — Z992 Dependence on renal dialysis: Secondary | ICD-10-CM | POA: Diagnosis not present

## 2021-07-11 DIAGNOSIS — N186 End stage renal disease: Secondary | ICD-10-CM | POA: Diagnosis not present

## 2021-07-14 DIAGNOSIS — N186 End stage renal disease: Secondary | ICD-10-CM | POA: Diagnosis not present

## 2021-07-14 DIAGNOSIS — Z992 Dependence on renal dialysis: Secondary | ICD-10-CM | POA: Diagnosis not present

## 2021-07-14 DIAGNOSIS — N2581 Secondary hyperparathyroidism of renal origin: Secondary | ICD-10-CM | POA: Diagnosis not present

## 2021-07-16 DIAGNOSIS — Z992 Dependence on renal dialysis: Secondary | ICD-10-CM | POA: Diagnosis not present

## 2021-07-16 DIAGNOSIS — N186 End stage renal disease: Secondary | ICD-10-CM | POA: Diagnosis not present

## 2021-07-16 DIAGNOSIS — N2581 Secondary hyperparathyroidism of renal origin: Secondary | ICD-10-CM | POA: Diagnosis not present

## 2021-07-18 DIAGNOSIS — N2581 Secondary hyperparathyroidism of renal origin: Secondary | ICD-10-CM | POA: Diagnosis not present

## 2021-07-18 DIAGNOSIS — Z992 Dependence on renal dialysis: Secondary | ICD-10-CM | POA: Diagnosis not present

## 2021-07-18 DIAGNOSIS — N186 End stage renal disease: Secondary | ICD-10-CM | POA: Diagnosis not present

## 2021-07-21 DIAGNOSIS — N2581 Secondary hyperparathyroidism of renal origin: Secondary | ICD-10-CM | POA: Diagnosis not present

## 2021-07-21 DIAGNOSIS — N186 End stage renal disease: Secondary | ICD-10-CM | POA: Diagnosis not present

## 2021-07-21 DIAGNOSIS — Z992 Dependence on renal dialysis: Secondary | ICD-10-CM | POA: Diagnosis not present

## 2021-07-23 DIAGNOSIS — Z992 Dependence on renal dialysis: Secondary | ICD-10-CM | POA: Diagnosis not present

## 2021-07-23 DIAGNOSIS — N186 End stage renal disease: Secondary | ICD-10-CM | POA: Diagnosis not present

## 2021-07-23 DIAGNOSIS — N2581 Secondary hyperparathyroidism of renal origin: Secondary | ICD-10-CM | POA: Diagnosis not present

## 2021-07-26 DIAGNOSIS — N2581 Secondary hyperparathyroidism of renal origin: Secondary | ICD-10-CM | POA: Diagnosis not present

## 2021-07-26 DIAGNOSIS — N186 End stage renal disease: Secondary | ICD-10-CM | POA: Diagnosis not present

## 2021-07-26 DIAGNOSIS — Z992 Dependence on renal dialysis: Secondary | ICD-10-CM | POA: Diagnosis not present

## 2021-07-28 ENCOUNTER — Ambulatory Visit: Payer: Medicare PPO | Admitting: Internal Medicine

## 2021-07-28 DIAGNOSIS — Z992 Dependence on renal dialysis: Secondary | ICD-10-CM | POA: Diagnosis not present

## 2021-07-28 DIAGNOSIS — N2581 Secondary hyperparathyroidism of renal origin: Secondary | ICD-10-CM | POA: Diagnosis not present

## 2021-07-28 DIAGNOSIS — N186 End stage renal disease: Secondary | ICD-10-CM | POA: Diagnosis not present

## 2021-07-29 ENCOUNTER — Telehealth: Payer: Self-pay | Admitting: Internal Medicine

## 2021-07-29 DIAGNOSIS — G20 Parkinson's disease: Secondary | ICD-10-CM

## 2021-07-29 DIAGNOSIS — R269 Unspecified abnormalities of gait and mobility: Secondary | ICD-10-CM

## 2021-07-29 NOTE — Telephone Encounter (Signed)
Ok for verbal 

## 2021-07-29 NOTE — Telephone Encounter (Signed)
Carolyn Valenzuela w/[facility] requesting a rx for a 4 wheel rolling walker for patient  Carolyn Valenzuela is requesting rx fax to: [phone]

## 2021-07-29 NOTE — Telephone Encounter (Signed)
Done hardcopy to cma 

## 2021-07-29 NOTE — Telephone Encounter (Signed)
Home Health verbal orders-caller/Agency: [facility] number: [phone]  Requesting OT/PT/Skilled nursing/Social Work/Speech: PT  Frequency: 1x9

## 2021-07-30 DIAGNOSIS — E1129 Type 2 diabetes mellitus with other diabetic kidney complication: Secondary | ICD-10-CM | POA: Diagnosis not present

## 2021-07-30 DIAGNOSIS — N2581 Secondary hyperparathyroidism of renal origin: Secondary | ICD-10-CM | POA: Diagnosis not present

## 2021-07-30 DIAGNOSIS — Z992 Dependence on renal dialysis: Secondary | ICD-10-CM | POA: Diagnosis not present

## 2021-07-30 DIAGNOSIS — N186 End stage renal disease: Secondary | ICD-10-CM | POA: Diagnosis not present

## 2021-07-30 NOTE — Telephone Encounter (Signed)
Left message for Carolyn Valenzuela to call me back for verbals

## 2021-07-30 NOTE — Telephone Encounter (Signed)
Verbals given  

## 2021-07-31 ENCOUNTER — Ambulatory Visit (INDEPENDENT_AMBULATORY_CARE_PROVIDER_SITE_OTHER): Payer: Medicare PPO | Admitting: *Deleted

## 2021-07-31 DIAGNOSIS — N186 End stage renal disease: Secondary | ICD-10-CM

## 2021-07-31 DIAGNOSIS — Z992 Dependence on renal dialysis: Secondary | ICD-10-CM

## 2021-07-31 DIAGNOSIS — I10 Essential (primary) hypertension: Secondary | ICD-10-CM

## 2021-07-31 DIAGNOSIS — E1122 Type 2 diabetes mellitus with diabetic chronic kidney disease: Secondary | ICD-10-CM

## 2021-07-31 NOTE — Patient Instructions (Signed)
Visit [name], thank you for taking time to talk with me today. Please don't hesitate to contact me if I can be of assistance to you before our next scheduled telephone appointment.  Below are the goals we discussed today:   Patient Self-Care Activities [name] will: Take medications as prescribed Attend all scheduled provider appointments Call pharmacy for medication refills Call provider office for new concerns or questions Continue to attend hemodialysis sessions as established: Monday- Wednesday- Friday Continue to monitor and write down on paper daily blood sugars at home Continue to follow heart healthy, low salt, low cholesterol, low-carbohydrate, and low sugar diet Continue to work with/ participate in home health services after your recent hospital and rehabilitation visit Keep up the great work to prevent falls   Our next appointment is by telephone on Thursday, August 21, 2021 at 11:00 am  Please call the care guide team at [phone] if you need to cancel or reschedule your appointment.   If you are experiencing a Mental Health or [place] or need someone to talk to, please call the [place] National Suicide Prevention Lifeline: (209)713-6583 or TTY: 864 643 3616 TTY [phone]) to talk to a trained counselor go to [facility] [address] [place] [phone])   Cholelithiasis Cholelithiasis happens when gallstones form in the gallbladder. The gallbladder stores bile. Bile is a fluid that helps digest fats. Bile can harden and form into gallstones. If they cause a blockage, they can cause pain (gallbladder attack). What are the causes? This condition may be caused by: Some blood diseases, such as sickle cell anemia. Too much of a fat-like substance (cholesterol) in your bile. Not enough bile salts in your bile. These salts help the body absorb and digest fats. The gallbladder not emptying  fully or often enough. This is common in pregnant women. What increases the risk? The following factors may make you more likely to develop this condition: Being female. Being pregnant many times. Eating a lot of fried foods, fat, and refined carbs (refined carbohydrates). Being very overweight (obese). Being older than age 35. Using medicines with female hormones in them for a long time. Losing weight fast. Having gallstones in your family. Having some health problems, such as diabetes, Crohn's disease, or liver disease. What are the signs or symptoms? Often, there may be gallstones but no symptoms. These gallstones are called silent gallstones. If a gallstone causes a blockage, you may get sudden pain. The pain: Can be in the upper right part of your belly (abdomen). Normally comes at night or after you eat. Can last an hour or more. Can spread to your right shoulder, back, or chest. Can feel like discomfort, burning, or fullness in the upper part of your belly (indigestion). If the blockage lasts more than a few hours, you can get an infection or swelling. You may: Feel like you may vomit. Vomit. Feel bloated. Have belly pain for 5 hours or more. Feel tender in your belly, often in the upper right part and under your ribs. Have fever or chills. Have skin or the white parts of your eyes turn yellow (jaundice). Have dark pee (urine) or pale poop (stool). How is this treated? Treatment for this condition depends on how bad you feel. If you have symptoms, you may need: Home care, if symptoms are not very bad. Do not eat for 12-24 hours. Drink only water and clear liquids. Start to eat simple or clear foods after 1 or 2  days. Try broths and crackers. You may need medicines for pain or stomach upset or both. If you have an infection, you will need antibiotics. A hospital stay, if you have very bad pain or a very bad infection. Surgery to remove your gallbladder. You may need this  if: Gallstones keep coming back. You have very bad symptoms. Medicines to break up gallstones. Medicines: Are best for small gallstones. May be used for up to 6-12 months. A procedure to find and take out gallstones or to break up gallstones. Follow these instructions at home: Medicines Take over-the-counter and prescription medicines only as told by your doctor. If you were prescribed an antibiotic medicine, take it as told by your doctor. Do not stop taking the antibiotic even if you start to feel better. Ask your doctor if the medicine prescribed to you requires you to avoid driving or using machinery. Eating and drinking Drink enough fluid to keep your urine pale yellow. Drink water or clear fluids. This is important when you have pain. Eat healthy foods. Choose: Fewer fatty foods, such as fried foods. Fewer refined carbs. Avoid breads and grains that are highly processed, such as white bread and white rice. Choose whole grains, such as whole-wheat bread and brown rice. More fiber. Almonds, fresh fruit, and beans are healthy sources. General instructions Keep a healthy weight. Keep all follow-up visits as told by your doctor. This is important. Where to find more information [employer] of Diabetes and Digestive and Kidney Diseases: [email or web address] Contact a doctor if: You have sudden pain in the upper right part of your belly. Pain might spread to your right shoulder, back, or chest. You have been diagnosed with gallstones that have no symptoms and you get: Belly pain. Discomfort, burning, or fullness in the upper part of your abdomen. You have dark urine or pale stools. Get help right away if: You have sudden pain in the upper right part of your abdomen, and the pain lasts more than 2 hours. You have pain in your abdomen, and: It lasts more than 5 hours. It keeps getting worse. You have a fever or chills. You keep feeling like you may vomit. You keep vomiting. Your  skin or the white parts of your eyes turn yellow. Summary Cholelithiasis happens when gallstones form in the gallbladder. This condition may be caused by a blood disease, too much of a fat-like substance in the bile, or not enough bile salts in bile. Treatment for this condition depends on how bad you feel. If you have symptoms, do not eat or drink. You may need medicines. You may need a hospital stay for very bad pain or a very bad infection. You may need surgery if gallstones keep coming back or if you have very bad symptoms. This information is not intended to replace advice given to you by your health care provider. Make sure you discuss any questions you have with your health care provider. Document Revised: 10/06/2019 Document Reviewed: 07/10/2019 Elsevier Patient Education  2022 [employer].   The patient verbalized understanding of instructions, educational materials, and care plan provided today and agreed to receive a mailed copy of patient instructions, educational materials, and care plan.   Oneta Rack, RN, BSN, [facility] RN Care Coordination- [place] [phone]: direct office

## 2021-07-31 NOTE — Chronic Care Management (AMB) (Signed)
Chronic Care Management   CCM RN Visit Note  07/31/2021 Name: Carolyn Valenzuela MRN: [id number] DOB: [birth year]  Subjective: Carolyn Valenzuela is a 70 y.o. year old female who is a primary care patient of Biagio Borg, MD. The care management team was consulted for assistance with disease management and care coordination needs.    Engaged with patient by telephone for follow up visit in response to provider referral for case management and/or care coordination services.   Consent to Services:  The patient was given information about Chronic Care Management services, agreed to services, and gave verbal consent prior to initiation of services.  Please see initial visit note for detailed documentation.  Patient agreed to services and verbal consent obtained.   Assessment: Review of patient past medical history, allergies, medications, health status, including review of consultants reports, laboratory and other test data, was performed as part of comprehensive evaluation and provision of chronic care management services.   SDOH (Social Determinants of Health) assessments and interventions performed:  SDOH Interventions    Flowsheet Row Most Recent Value  SDOH Interventions   Food Insecurity Interventions Intervention Not Indicated  [Patient denies food insecurity]  Housing Interventions Intervention Not Indicated  Transportation Interventions Intervention Not Indicated  [Continues using [facility],  family assists as indicated]      CCM Care Plan Allergies  Allergen Reactions   Oxycodone Itching   Outpatient Encounter Medications as of 07/31/2021  Medication Sig Note   acetaminophen (TYLENOL) 500 MG tablet Take 500 mg by mouth every 6 (six) hours as needed for mild pain.    albuterol (VENTOLIN HFA) 108 (90 Base) MCG/ACT inhaler Inhale 2 puffs into the lungs every 6 (six) hours as needed for wheezing or shortness of breath.    ALPHAGAN P 0.1 % SOLN Place 1 drop into both eyes 3 (three) times  daily. 06/25/2021: Taking every day per patient    amLODipine (NORVASC) 10 MG tablet TAKE 1 TABLET BY MOUTH EVERYDAY AT BEDTIME (Patient taking differently: Take 10 mg by mouth daily.)    aspirin 81 MG EC tablet Take 81 mg by mouth daily.    cinacalcet (SENSIPAR) 30 MG tablet Take 1 tablet (30 mg total) by mouth every Monday, Wednesday, and Friday at 6 PM.    clopidogrel (PLAVIX) 75 MG tablet Take 1 tablet (75 mg total) by mouth daily.    gabapentin (NEURONTIN) 100 MG capsule Take 1 capsule (100 mg total) by mouth at bedtime.    loperamide (IMODIUM) 2 MG capsule Take 1 capsule (2 mg total) by mouth every 4 (four) hours as needed for diarrhea or loose stools.    Methoxy PEG-Epoetin Beta (MIRCERA IJ) Mircera    multivitamin (RENA-VIT) TABS tablet TAKE 1 TABLET BY MOUTH EVERYDAY AT BEDTIME (Patient taking differently: Take 1 tablet by mouth daily.)    nebivolol (BYSTOLIC) 5 MG tablet Take 1 tablet (5 mg total) by mouth daily.    ondansetron (ZOFRAN) 4 MG tablet Take 1 tablet (4 mg total) by mouth every 8 (eight) hours as needed for nausea.    pantoprazole (PROTONIX) 40 MG tablet TAKE 1 TABLET BY MOUTH TWICE A DAY (Patient taking differently: Take 40 mg by mouth daily.)    psyllium (HYDROCIL/METAMUCIL) 95 % PACK Take 1 packet by mouth daily.    RESTASIS 0.05 % ophthalmic emulsion Place 1 drop into both eyes 2 (two) times daily.    VELPHORO 500 MG chewable tablet Chew 500 mg by mouth 2 (two) times daily. 06/25/2021:  Patient states she only takes twice daily    Facility-Administered Encounter Medications as of 07/31/2021  Medication   triamcinolone acetonide (TRIESENCE) 40 MG/ML subtenons injection 4 mg   Patient Active Problem List   Diagnosis Date Noted   Physical deconditioning 06/29/2021   Cholelithiases 06/26/2021   Choledocholithiasis 06/25/2021   Lump of right breast 02/16/2021   Gait disorder 02/16/2021   Finger pain 02/16/2021   Right shoulder pain 02/16/2021   Other disorders of  plasma-protein metabolism, not elsewhere classified 01/13/2021   Encounter for screening for COVID-19 04/22/2020   Allergy, unspecified, initial encounter 03/28/2020   Anaphylactic shock, unspecified, initial encounter 03/28/2020   Chronic low back pain 03/12/2020   Closed displaced fracture of shaft of fifth metacarpal bone of left hand 04/25/2019   Left hip pain 11/18/2017   GERD (gastroesophageal reflux disease) 10/10/2017   Bloating 10/10/2017   PVD (peripheral vascular disease) ([place])    Phantom pain after amputation of lower extremity ([place]) 01/05/2017   Encounter for removal of sutures 12/01/2016   Abnormal CXR 07/07/2016   Pneumonia, unspecified organism 07/07/2016   Hypotension    NSTEMI (non-ST elevated myocardial infarction) ([place])    Encounter for immunization 05/27/2016   Nausea and vomiting 03/18/2016   Hypokalemia 01/31/2016   Sepsis ([place]) due to UTI 12/20/2015   Acute lower UTI    Labile blood pressure    SIRS (systemic inflammatory response syndrome) ([place])    Hypoglycemia associated with diabetes ([place])    Urinary retention    Dysphagia    S/P bilateral BKA (below knee amputation) ([place])    Abnormality of gait    Muscle spasm    ESRD on dialysis ([place])    Type 2 diabetes mellitus with diabetic peripheral angiopathy and gangrene, without long-term current use of insulin (HCC)    Post-operative pain    Metabolic bone disease    Hypoxia    Type 2 diabetes mellitus with peripheral neuropathy (HCC)    Labile blood glucose    Postoperative pain    Acute blood loss anemia    Anemia of chronic disease    Below knee amputation status 11/29/2015   Odynophagia 11/12/2015   Unspecified disorder of calcium metabolism 07/31/2015   Chest pain, atypical 06/06/2015   Ingrown nail 05/28/2015   Other fluid overload 02/15/2015   Shortness of breath 12/13/2014   Diarrhea, unspecified 12/13/2014   Pain, unspecified 12/13/2014   Pruritus, unspecified 12/13/2014   Unspecified  protein-calorie malnutrition ([place]) 10/17/2014   Acute pulmonary edema (HCC)    FUO (fever of unknown origin)    Complication of vascular dialysis catheter 09/10/2014   Fever, unspecified 09/10/2014   HCAP (healthcare-associated pneumonia)    Coagulation defect, unspecified ([place]) 09/06/2014   Radiculopathy, cervical region 09/06/2014   End stage renal disease ([place]) 09/06/2014   Essential (primary) hypertension 09/06/2014   Hyperlipidemia, unspecified 09/06/2014   Encounter for screening for respiratory tuberculosis 09/06/2014   Secondary hyperparathyroidism of renal origin ([place]) 09/06/2014   Anemia in chronic kidney disease 09/06/2014   Type 2 diabetes mellitus with ESRD (end-stage renal disease) ([place]) 09/01/2014   Anemia of renal disease 09/01/2014   Venous insufficiency of left leg 08/27/2014   Rash 07/06/2014   Diabetic wet gangrene of the foot - Right 03/15/2014   Critical lower limb ischemia ([place]) 01/09/2014   Right foot ulcer ([place]) 11/07/2013   Toe pain 09/06/2013   Pre-ulcerative corn or callous 09/06/2013   Lower extremity pain 09/06/2013   Low back pain  12/08/2011   Hip bursitis, left 12/08/2011   Left leg pain 12/08/2011   Abdominal pain 07/08/2011   Encounter for well adult exam with abnormal findings 12/08/2010   Allergic rhinitis, cause unspecified 12/08/2010   Type 2 diabetes mellitus with diabetic chronic kidney disease ([place]) 05/21/2009   SECONDARY HYPERPARATHYROIDISM 05/21/2009   NUMBNESS 05/21/2009   BACK PAIN 05/02/2009   ANEMIA-IRON DEFICIENCY 01/25/2008   Essential hypertension 01/25/2008   Brachial neuritis or radiculitis 01/25/2008   Hyperlipidemia 03/30/2007   Conditions to be addressed/monitored:  HTN, DMII, and ESRD  Care Plan : RN Care Manager Plan of Care  Updates made by Knox Royalty, RN since 07/31/2021 12:00 AM     Problem: Chronic Disease Management Needs   Priority: Medium     Long-Range Goal: Development of plan of care for long term  chronic disease management   Start Date: 05/01/2021  Expected End Date: 05/01/2022  Priority: Medium  Note:   Current Barriers:  Chronic Disease Management support and education needs related to HTN, DMII, and ESRD Bilateral BKA- has bilateral prosthesis: uses walker- denies recent falls ESRD- on hemodialysis: M-W-F; adherent to HD schedule; uses bus ([facility]) for transportation to HD Recent hospitalization October 27-July 01, 2021 for cholelithiasis- discharged to SNF/ rehabilitation center- discharged from SNF/ rehab to home on 07/27/21-- home health services active  RNCM Clinical Goal(s):  Patient will demonstrate ongoing health management independence for DMII; ESRD- on HD M-W-F; HTN  through collaboration with RN Care manager, provider, and care team.   Interventions: 1:1 collaboration with primary care provider regarding development and update of comprehensive plan of care as evidenced by provider attestation and co-signature Inter-disciplinary care team collaboration (see longitudinal plan of care) Evaluation of current treatment plan related to  self management and patient's adherence to plan as established by provider Discussed current clinical condition- denies current clinical concerns: reports "doing okay overall" since SNF discharge; patient reports ongoing (chronic) diarrhea: encouraged her to discuss with Dr. Jenny Reichmann at upcoming scheduled PCP appointment Reviewed recent hospital/ SNF visit- patient verbalizes good understanding of same Confirmed home health services active: encouraged patient's ongoing active participation Confirmed no missed hemodialysis post- recent hospitalization Confirmed no new/ recent falls; reviewed with patient home health request for new rolling walker- confirmed for patient that Dr. Jenny Reichmann authorized request/ has placed order; she will follow up with home health team to determine status of approved order for DME  Diabetes:  (Status: Goal on Track  (progressing): YES.) Lab Results  Component Value Date   HGBA1C 6.4 02/13/2021  Discussed plans with patient for ongoing care management follow up and provided patient with direct contact information for care management team;      Reviewed scheduled/upcoming provider appointments including: 08/12/21- sports medicine provider; 08/19/21- PCP; patient reports she has a conflict with another scheduled (vascular) provider office visit on 08/12/21- encouraged her to re-schedule sports medicine visit and attend vascular provider appointment: reports she will do;         Review of patient status, including review of consultants reports, relevant laboratory and other test results, and medications completed;       Assessed social determinant of health barriers;        Confirmed patient continues to monitor and record blood sugars at home fasting and HS: reviewed with patient general blood sugars at home: reports consistent fasting and post-prandial ranges between 130-160; states blood sugar fasting 113 today Confirmed no recent low blood sugars: reinforced previously provided education around dangers/ risks  of hypoglycemia Confirmed no food insecurity; patient continues to endorse following appropriate DMII diet- low carbohydrate/ low sugar  Hypertension: (Status: Goal on Track (progressing): YES.) Last practice recorded BP readings:  BP Readings from Last 3 Encounters:  04/22/21 140/66  03/11/21 128/60  02/13/21 (!) 150/76  Most recent eGFR/CrCl: No results found for: EGFR  No components found for: CRCL  Evaluation of current treatment plan related to hypertension self management and patient's adherence to plan as established by provider;   Discussed complications of poorly controlled blood pressure such as heart disease, stroke, circulatory complications, vision complications, kidney impairment, sexual dysfunction;  Confirmed patient does not monitor blood pressures at home: continues to monitor weekly  at hemodialysis sessions M-W-F; confirms no missed hemodialysis sessions post- SNF discharge; positive reinforcement provided with encouragement to continue efforts Confirmed no recent hypotensive episodes with hemodialysis Confirmed no medication concerns: reports her medications were "somewhat disorganized" when she arrived home; confirms home health nurse assisting with medications; reports her niece is also helping  Patient Goals/Self-Care Activities: As evidenced by review of EHR, collaboration with care team, and patient reporting during CCM RN CM outreach, Patient Atonya will: Take medications as prescribed Attend all scheduled provider appointments Call pharmacy for medication refills Call provider office for new concerns or questions Continue to attend hemodialysis sessions as established: Monday- Wednesday- Friday Continue to monitor and write down on paper daily blood sugars at home Continue to follow heart healthy, low salt, low cholesterol, low-carbohydrate, and low sugar diet Continue to work with/ participate in home health services after your recent hospital and rehabilitation visit Keep up the great work to prevent falls     Plan: Telephone follow up appointment with care management team member scheduled for:  Thursday, August 21, 2021 at 11:00 am The patient has been provided with contact information for the care management team and has been advised to call with any health related questions or concerns  Oneta Rack, RN, BSN, Agency [phone]: direct office

## 2021-07-31 NOTE — Telephone Encounter (Signed)
Order faxed.

## 2021-08-01 DIAGNOSIS — N2581 Secondary hyperparathyroidism of renal origin: Secondary | ICD-10-CM | POA: Diagnosis not present

## 2021-08-01 DIAGNOSIS — N186 End stage renal disease: Secondary | ICD-10-CM | POA: Diagnosis not present

## 2021-08-01 DIAGNOSIS — Z992 Dependence on renal dialysis: Secondary | ICD-10-CM | POA: Diagnosis not present

## 2021-08-04 ENCOUNTER — Other Ambulatory Visit: Payer: Self-pay | Admitting: Internal Medicine

## 2021-08-04 ENCOUNTER — Telehealth: Payer: Self-pay | Admitting: Internal Medicine

## 2021-08-04 DIAGNOSIS — N2581 Secondary hyperparathyroidism of renal origin: Secondary | ICD-10-CM | POA: Diagnosis not present

## 2021-08-04 DIAGNOSIS — N186 End stage renal disease: Secondary | ICD-10-CM | POA: Diagnosis not present

## 2021-08-04 DIAGNOSIS — Z992 Dependence on renal dialysis: Secondary | ICD-10-CM | POA: Diagnosis not present

## 2021-08-04 NOTE — Telephone Encounter (Signed)
Angwl from [facility] called to report a missed visit with pt. From Friday, December 2.    Callback #- [phone]

## 2021-08-04 NOTE — Telephone Encounter (Signed)
Please refill as per office routine med refill policy (all routine meds to be refilled for 3 mo or monthly (per pt preference) up to one year from last visit, then month to month grace period for 3 mo, then further med refills will have to be denied) ? ?

## 2021-08-05 NOTE — Telephone Encounter (Signed)
Left message for Glenard Haring to give me a call back

## 2021-08-06 DIAGNOSIS — N186 End stage renal disease: Secondary | ICD-10-CM | POA: Diagnosis not present

## 2021-08-06 DIAGNOSIS — N2581 Secondary hyperparathyroidism of renal origin: Secondary | ICD-10-CM | POA: Diagnosis not present

## 2021-08-06 DIAGNOSIS — Z992 Dependence on renal dialysis: Secondary | ICD-10-CM | POA: Diagnosis not present

## 2021-08-08 DIAGNOSIS — Z992 Dependence on renal dialysis: Secondary | ICD-10-CM | POA: Diagnosis not present

## 2021-08-08 DIAGNOSIS — N2581 Secondary hyperparathyroidism of renal origin: Secondary | ICD-10-CM | POA: Diagnosis not present

## 2021-08-08 DIAGNOSIS — N186 End stage renal disease: Secondary | ICD-10-CM | POA: Diagnosis not present

## 2021-08-11 DIAGNOSIS — Z992 Dependence on renal dialysis: Secondary | ICD-10-CM | POA: Diagnosis not present

## 2021-08-11 DIAGNOSIS — N2581 Secondary hyperparathyroidism of renal origin: Secondary | ICD-10-CM | POA: Diagnosis not present

## 2021-08-11 DIAGNOSIS — N186 End stage renal disease: Secondary | ICD-10-CM | POA: Diagnosis not present

## 2021-08-11 NOTE — Progress Notes (Deleted)
   I, [name], LAT, ATC, am serving as scribe for Dr. Lynne Leader.  Carolyn Valenzuela is a 70 y.o. female who presents to [place] at [facility] today for f/u of chronic R shoulder pain due to DJD.  She was last seen by Dr. Georgina Snell on 05/13/21 and had a R GHJ steroid injection.  Today, pt reports   Diagnostic imaging: R shoulder XR- 03/11/21  Pertinent review of systems: ***  Relevant historical information: ***   Exam:  There were no vitals taken for this visit. General: Well Developed, well nourished, and in no acute distress.   MSK: ***    Lab and Radiology Results No results found for this or any previous visit (from the past 72 hour(s)). No results found.     Assessment and Plan: 70 y.o. female with ***   PDMP not reviewed this encounter. No orders of the defined types were placed in this encounter.  No orders of the defined types were placed in this encounter.    Discussed warning signs or symptoms. Please see discharge instructions. Patient expresses understanding.   ***

## 2021-08-12 ENCOUNTER — Telehealth: Payer: Self-pay

## 2021-08-12 ENCOUNTER — Ambulatory Visit: Payer: Medicare PPO | Admitting: Family Medicine

## 2021-08-12 NOTE — Telephone Encounter (Signed)
Patient calling to see if she could be seen by Dr. Jenny Reichmann earlier , however no available slots, pt kept original appt.

## 2021-08-13 DIAGNOSIS — Z992 Dependence on renal dialysis: Secondary | ICD-10-CM | POA: Diagnosis not present

## 2021-08-13 DIAGNOSIS — N186 End stage renal disease: Secondary | ICD-10-CM | POA: Diagnosis not present

## 2021-08-13 DIAGNOSIS — N2581 Secondary hyperparathyroidism of renal origin: Secondary | ICD-10-CM | POA: Diagnosis not present

## 2021-08-15 ENCOUNTER — Other Ambulatory Visit: Payer: Self-pay | Admitting: Internal Medicine

## 2021-08-15 DIAGNOSIS — N2581 Secondary hyperparathyroidism of renal origin: Secondary | ICD-10-CM | POA: Diagnosis not present

## 2021-08-15 DIAGNOSIS — Z992 Dependence on renal dialysis: Secondary | ICD-10-CM | POA: Diagnosis not present

## 2021-08-15 DIAGNOSIS — N186 End stage renal disease: Secondary | ICD-10-CM | POA: Diagnosis not present

## 2021-08-18 DIAGNOSIS — N186 End stage renal disease: Secondary | ICD-10-CM | POA: Diagnosis not present

## 2021-08-18 DIAGNOSIS — Z992 Dependence on renal dialysis: Secondary | ICD-10-CM | POA: Diagnosis not present

## 2021-08-18 DIAGNOSIS — N2581 Secondary hyperparathyroidism of renal origin: Secondary | ICD-10-CM | POA: Diagnosis not present

## 2021-08-19 ENCOUNTER — Encounter (HOSPITAL_COMMUNITY): Payer: Self-pay | Admitting: Emergency Medicine

## 2021-08-19 ENCOUNTER — Encounter: Payer: Self-pay | Admitting: Internal Medicine

## 2021-08-19 ENCOUNTER — Inpatient Hospital Stay (HOSPITAL_COMMUNITY)
Admission: EM | Admit: 2021-08-19 | Discharge: 2021-08-22 | DRG: 193 | Disposition: A | Discharge status: Hospice | Payer: Medicare PPO | Attending: Family Medicine | Admitting: Family Medicine

## 2021-08-19 ENCOUNTER — Other Ambulatory Visit: Payer: Self-pay

## 2021-08-19 ENCOUNTER — Emergency Department (HOSPITAL_COMMUNITY): Payer: Medicare PPO

## 2021-08-19 ENCOUNTER — Ambulatory Visit (INDEPENDENT_AMBULATORY_CARE_PROVIDER_SITE_OTHER): Payer: Medicare PPO | Admitting: Internal Medicine

## 2021-08-19 DIAGNOSIS — D631 Anemia in chronic kidney disease: Secondary | ICD-10-CM | POA: Diagnosis present

## 2021-08-19 DIAGNOSIS — Z20822 Contact with and (suspected) exposure to covid-19: Secondary | ICD-10-CM | POA: Diagnosis not present

## 2021-08-19 DIAGNOSIS — J9811 Atelectasis: Secondary | ICD-10-CM | POA: Diagnosis not present

## 2021-08-19 DIAGNOSIS — E872 Acidosis, unspecified: Secondary | ICD-10-CM | POA: Diagnosis present

## 2021-08-19 DIAGNOSIS — Z955 Presence of coronary angioplasty implant and graft: Secondary | ICD-10-CM

## 2021-08-19 DIAGNOSIS — J189 Pneumonia, unspecified organism: Principal | ICD-10-CM | POA: Diagnosis present

## 2021-08-19 DIAGNOSIS — I5022 Chronic systolic (congestive) heart failure: Secondary | ICD-10-CM | POA: Diagnosis present

## 2021-08-19 DIAGNOSIS — N186 End stage renal disease: Secondary | ICD-10-CM | POA: Diagnosis not present

## 2021-08-19 DIAGNOSIS — E43 Unspecified severe protein-calorie malnutrition: Secondary | ICD-10-CM | POA: Diagnosis present

## 2021-08-19 DIAGNOSIS — E1122 Type 2 diabetes mellitus with diabetic chronic kidney disease: Secondary | ICD-10-CM | POA: Diagnosis present

## 2021-08-19 DIAGNOSIS — K219 Gastro-esophageal reflux disease without esophagitis: Secondary | ICD-10-CM | POA: Diagnosis present

## 2021-08-19 DIAGNOSIS — Z6821 Body mass index (BMI) 21.0-21.9, adult: Secondary | ICD-10-CM

## 2021-08-19 DIAGNOSIS — Z7982 Long term (current) use of aspirin: Secondary | ICD-10-CM

## 2021-08-19 DIAGNOSIS — Z89511 Acquired absence of right leg below knee: Secondary | ICD-10-CM

## 2021-08-19 DIAGNOSIS — R4182 Altered mental status, unspecified: Secondary | ICD-10-CM | POA: Diagnosis not present

## 2021-08-19 DIAGNOSIS — N2581 Secondary hyperparathyroidism of renal origin: Secondary | ICD-10-CM | POA: Diagnosis present

## 2021-08-19 DIAGNOSIS — Z79899 Other long term (current) drug therapy: Secondary | ICD-10-CM

## 2021-08-19 DIAGNOSIS — Z7189 Other specified counseling: Secondary | ICD-10-CM | POA: Diagnosis not present

## 2021-08-19 DIAGNOSIS — E785 Hyperlipidemia, unspecified: Secondary | ICD-10-CM | POA: Diagnosis present

## 2021-08-19 DIAGNOSIS — R269 Unspecified abnormalities of gait and mobility: Secondary | ICD-10-CM

## 2021-08-19 DIAGNOSIS — E8809 Other disorders of plasma-protein metabolism, not elsewhere classified: Secondary | ICD-10-CM | POA: Diagnosis present

## 2021-08-19 DIAGNOSIS — E1142 Type 2 diabetes mellitus with diabetic polyneuropathy: Secondary | ICD-10-CM | POA: Diagnosis present

## 2021-08-19 DIAGNOSIS — I132 Hypertensive heart and chronic kidney disease with heart failure and with stage 5 chronic kidney disease, or end stage renal disease: Secondary | ICD-10-CM | POA: Diagnosis not present

## 2021-08-19 DIAGNOSIS — L89152 Pressure ulcer of sacral region, stage 2: Secondary | ICD-10-CM | POA: Diagnosis not present

## 2021-08-19 DIAGNOSIS — L89892 Pressure ulcer of other site, stage 2: Secondary | ICD-10-CM | POA: Diagnosis present

## 2021-08-19 DIAGNOSIS — R627 Adult failure to thrive: Secondary | ICD-10-CM | POA: Diagnosis present

## 2021-08-19 DIAGNOSIS — L8990 Pressure ulcer of unspecified site, unspecified stage: Secondary | ICD-10-CM | POA: Insufficient documentation

## 2021-08-19 DIAGNOSIS — E1165 Type 2 diabetes mellitus with hyperglycemia: Secondary | ICD-10-CM | POA: Diagnosis present

## 2021-08-19 DIAGNOSIS — F419 Anxiety disorder, unspecified: Secondary | ICD-10-CM | POA: Diagnosis present

## 2021-08-19 DIAGNOSIS — R531 Weakness: Secondary | ICD-10-CM

## 2021-08-19 DIAGNOSIS — E7800 Pure hypercholesterolemia, unspecified: Secondary | ICD-10-CM | POA: Diagnosis not present

## 2021-08-19 DIAGNOSIS — Z89512 Acquired absence of left leg below knee: Secondary | ICD-10-CM

## 2021-08-19 DIAGNOSIS — Z515 Encounter for palliative care: Secondary | ICD-10-CM

## 2021-08-19 DIAGNOSIS — R5381 Other malaise: Secondary | ICD-10-CM | POA: Diagnosis present

## 2021-08-19 DIAGNOSIS — A419 Sepsis, unspecified organism: Secondary | ICD-10-CM | POA: Diagnosis not present

## 2021-08-19 DIAGNOSIS — R17 Unspecified jaundice: Secondary | ICD-10-CM | POA: Diagnosis present

## 2021-08-19 DIAGNOSIS — Z9842 Cataract extraction status, left eye: Secondary | ICD-10-CM

## 2021-08-19 DIAGNOSIS — Z66 Do not resuscitate: Secondary | ICD-10-CM | POA: Diagnosis not present

## 2021-08-19 DIAGNOSIS — Z7902 Long term (current) use of antithrombotics/antiplatelets: Secondary | ICD-10-CM

## 2021-08-19 DIAGNOSIS — I120 Hypertensive chronic kidney disease with stage 5 chronic kidney disease or end stage renal disease: Secondary | ICD-10-CM | POA: Diagnosis not present

## 2021-08-19 DIAGNOSIS — I255 Ischemic cardiomyopathy: Secondary | ICD-10-CM | POA: Diagnosis present

## 2021-08-19 DIAGNOSIS — I252 Old myocardial infarction: Secondary | ICD-10-CM

## 2021-08-19 DIAGNOSIS — M898X9 Other specified disorders of bone, unspecified site: Secondary | ICD-10-CM | POA: Diagnosis present

## 2021-08-19 DIAGNOSIS — E1129 Type 2 diabetes mellitus with other diabetic kidney complication: Secondary | ICD-10-CM | POA: Diagnosis not present

## 2021-08-19 DIAGNOSIS — J90 Pleural effusion, not elsewhere classified: Secondary | ICD-10-CM | POA: Diagnosis not present

## 2021-08-19 DIAGNOSIS — I10 Essential (primary) hypertension: Secondary | ICD-10-CM | POA: Diagnosis present

## 2021-08-19 DIAGNOSIS — I517 Cardiomegaly: Secondary | ICD-10-CM | POA: Diagnosis not present

## 2021-08-19 DIAGNOSIS — N250 Renal osteodystrophy: Secondary | ICD-10-CM | POA: Diagnosis not present

## 2021-08-19 DIAGNOSIS — Z992 Dependence on renal dialysis: Secondary | ICD-10-CM

## 2021-08-19 DIAGNOSIS — E1151 Type 2 diabetes mellitus with diabetic peripheral angiopathy without gangrene: Secondary | ICD-10-CM | POA: Diagnosis present

## 2021-08-19 DIAGNOSIS — Z9841 Cataract extraction status, right eye: Secondary | ICD-10-CM

## 2021-08-19 DIAGNOSIS — Z751 Person awaiting admission to adequate facility elsewhere: Secondary | ICD-10-CM

## 2021-08-19 DIAGNOSIS — Z885 Allergy status to narcotic agent status: Secondary | ICD-10-CM

## 2021-08-19 DIAGNOSIS — I2510 Atherosclerotic heart disease of native coronary artery without angina pectoris: Secondary | ICD-10-CM | POA: Diagnosis present

## 2021-08-19 LAB — CBC WITH DIFFERENTIAL/PLATELET
Abs Immature Granulocytes: 0.1 10*3/uL — ABNORMAL HIGH (ref 0.00–0.07)
Basophils Absolute: 0.1 10*3/uL (ref 0.0–0.1)
Basophils Relative: 0 %
Eosinophils Absolute: 0 10*3/uL (ref 0.0–0.5)
Eosinophils Relative: 0 %
HCT: 33.5 % — ABNORMAL LOW (ref 36.0–46.0)
Hemoglobin: 11.2 g/dL — ABNORMAL LOW (ref 12.0–15.0)
Immature Granulocytes: 1 %
Lymphocytes Relative: 9 %
Lymphs Abs: 1.6 10*3/uL (ref 0.7–4.0)
MCH: 30.4 pg (ref 26.0–34.0)
MCHC: 33.4 g/dL (ref 30.0–36.0)
MCV: 91 fL (ref 80.0–100.0)
Monocytes Absolute: 1.3 10*3/uL — ABNORMAL HIGH (ref 0.1–1.0)
Monocytes Relative: 8 %
Neutro Abs: 14.7 10*3/uL — ABNORMAL HIGH (ref 1.7–7.7)
Neutrophils Relative %: 82 %
Platelets: 173 10*3/uL (ref 150–400)
RBC: 3.68 MIL/uL — ABNORMAL LOW (ref 3.87–5.11)
RDW: 18.1 % — ABNORMAL HIGH (ref 11.5–15.5)
WBC: 17.8 10*3/uL — ABNORMAL HIGH (ref 4.0–10.5)
nRBC: 0 % (ref 0.0–0.2)

## 2021-08-19 LAB — COMPREHENSIVE METABOLIC PANEL
ALT: 15 U/L (ref 0–44)
AST: 24 U/L (ref 15–41)
Albumin: 2.2 g/dL — ABNORMAL LOW (ref 3.5–5.0)
Alkaline Phosphatase: 92 U/L (ref 38–126)
Anion gap: 11 (ref 5–15)
BUN: 13 mg/dL (ref 8–23)
CO2: 24 mmol/L (ref 22–32)
Calcium: 8.1 mg/dL — ABNORMAL LOW (ref 8.9–10.3)
Chloride: 95 mmol/L — ABNORMAL LOW (ref 98–111)
Creatinine, Ser: 3.12 mg/dL — ABNORMAL HIGH (ref 0.44–1.00)
GFR, Estimated: 16 mL/min — ABNORMAL LOW (ref >60)
Glucose, Bld: 299 mg/dL — ABNORMAL HIGH (ref 70–99)
Potassium: 3.6 mmol/L (ref 3.5–5.1)
Sodium: 130 mmol/L — ABNORMAL LOW (ref 135–145)
Total Bilirubin: 1.8 mg/dL — ABNORMAL HIGH (ref 0.3–1.2)
Total Protein: 6.1 g/dL — ABNORMAL LOW (ref 6.5–8.1)

## 2021-08-19 LAB — RESP PANEL BY RT-PCR (FLU A&B, COVID) ARPGX2
Influenza A by PCR: NEGATIVE
Influenza B by PCR: NEGATIVE
SARS Coronavirus 2 by RT PCR: NEGATIVE

## 2021-08-19 LAB — CBG MONITORING, ED
Glucose-Capillary: 292 mg/dL — ABNORMAL HIGH (ref 70–99)
Glucose-Capillary: 263 mg/dL — ABNORMAL HIGH (ref 70–99)

## 2021-08-19 LAB — LACTIC ACID, PLASMA: Lactic Acid, Venous: 3.2 mmol/L (ref 0.5–1.9)

## 2021-08-19 LAB — AMMONIA: Ammonia: 25 umol/L (ref 9–35)

## 2021-08-19 MED ORDER — ROSUVASTATIN CALCIUM 20 MG PO TABS
10.0000 mg | ORAL_TABLET | Freq: Every day | ORAL | Status: DC
  Administered 2021-08-20 – 2021-08-21 (×2): 10 mg via ORAL
  Filled 2021-08-19 (×2): qty 1

## 2021-08-19 MED ORDER — SODIUM CHLORIDE 0.9 % IV BOLUS: 1000.0000 mL | Freq: Once | INTRAVENOUS | Status: DC

## 2021-08-19 MED ORDER — SODIUM CHLORIDE 0.9 % IV SOLN
2.0000 g | INTRAVENOUS | Status: DC
  Administered 2021-08-20 – 2021-08-21 (×2): 2 g via INTRAVENOUS
  Filled 2021-08-19 (×3): qty 20

## 2021-08-19 MED ORDER — LACTATED RINGERS IV SOLN: INTRAVENOUS | Status: DC

## 2021-08-19 MED ORDER — ONDANSETRON HCL 4 MG/2ML IJ SOLN: 4.0000 mg | Freq: Four times a day (QID) | INTRAMUSCULAR | Status: DC | PRN

## 2021-08-19 MED ORDER — BRIMONIDINE TARTRATE 0.2 % OP SOLN
1.0000 [drp] | Freq: Three times a day (TID) | OPHTHALMIC | Status: DC
  Administered 2021-08-20 – 2021-08-22 (×7): 1 [drp] via OPHTHALMIC
  Filled 2021-08-19: qty 5

## 2021-08-19 MED ORDER — PANTOPRAZOLE SODIUM 40 MG PO TBEC
40.0000 mg | DELAYED_RELEASE_TABLET | Freq: Every day | ORAL | Status: DC
  Administered 2021-08-20 – 2021-08-21 (×2): 40 mg via ORAL
  Filled 2021-08-19 (×2): qty 1

## 2021-08-19 MED ORDER — ONDANSETRON HCL 4 MG PO TABS: 4.0000 mg | ORAL_TABLET | Freq: Four times a day (QID) | ORAL | Status: DC | PRN

## 2021-08-19 MED ORDER — NEBIVOLOL HCL 5 MG PO TABS: 5.0000 mg | ORAL_TABLET | Freq: Every day | ORAL | Status: DC

## 2021-08-19 MED ORDER — AMLODIPINE BESYLATE 5 MG PO TABS: 10.0000 mg | ORAL_TABLET | Freq: Every day | ORAL | Status: DC

## 2021-08-19 MED ORDER — ACETAMINOPHEN 325 MG PO TABS: 650.0000 mg | ORAL_TABLET | Freq: Four times a day (QID) | ORAL | Status: DC | PRN

## 2021-08-19 MED ORDER — CINACALCET HCL 30 MG PO TABS
30.0000 mg | ORAL_TABLET | ORAL | Status: DC
  Administered 2021-08-20: 16:00:00 30 mg via ORAL
  Filled 2021-08-19 (×2): qty 1

## 2021-08-19 MED ORDER — CLOPIDOGREL BISULFATE 75 MG PO TABS
75.0000 mg | ORAL_TABLET | Freq: Every day | ORAL | Status: DC
  Administered 2021-08-20 – 2021-08-21 (×2): 75 mg via ORAL
  Filled 2021-08-19 (×2): qty 1

## 2021-08-19 MED ORDER — SODIUM CHLORIDE 0.9 % IV SOLN
1.0000 g | Freq: Once | INTRAVENOUS | Status: AC
  Administered 2021-08-19: 15:00:00 1 g via INTRAVENOUS
  Filled 2021-08-19: qty 10

## 2021-08-19 MED ORDER — SODIUM CHLORIDE 0.9 % IV SOLN
500.0000 mg | INTRAVENOUS | Status: DC
  Administered 2021-08-20 – 2021-08-21 (×2): 500 mg via INTRAVENOUS
  Filled 2021-08-19 (×4): qty 5

## 2021-08-19 MED ORDER — GABAPENTIN 100 MG PO CAPS
200.0000 mg | ORAL_CAPSULE | Freq: Three times a day (TID) | ORAL | Status: DC
  Administered 2021-08-20 – 2021-08-21 (×5): 200 mg via ORAL
  Filled 2021-08-19 (×6): qty 2

## 2021-08-19 MED ORDER — ENOXAPARIN SODIUM 30 MG/0.3ML IJ SOSY
30.0000 mg | PREFILLED_SYRINGE | INTRAMUSCULAR | Status: DC
  Administered 2021-08-20 – 2021-08-21 (×3): 30 mg via SUBCUTANEOUS
  Filled 2021-08-19 (×3): qty 0.3

## 2021-08-19 MED ORDER — ACETAMINOPHEN 650 MG RE SUPP: 650.0000 mg | Freq: Four times a day (QID) | RECTAL | Status: DC | PRN

## 2021-08-19 MED ORDER — SODIUM CHLORIDE 0.9 % IV SOLN
500.0000 mg | Freq: Once | INTRAVENOUS | Status: AC
  Administered 2021-08-19: 16:00:00 500 mg via INTRAVENOUS
  Filled 2021-08-19: qty 5

## 2021-08-19 MED ORDER — INSULIN ASPART 100 UNIT/ML IJ SOLN
0.0000 [IU] | Freq: Three times a day (TID) | INTRAMUSCULAR | Status: DC
  Administered 2021-08-19: 20:00:00 3 [IU] via SUBCUTANEOUS
  Filled 2021-08-19: qty 0.06

## 2021-08-19 MED ORDER — ALBUTEROL SULFATE (2.5 MG/3ML) 0.083% IN NEBU: 3.0000 mL | INHALATION_SOLUTION | Freq: Four times a day (QID) | RESPIRATORY_TRACT | Status: DC | PRN

## 2021-08-19 MED ORDER — ASPIRIN EC 81 MG PO TBEC
81.0000 mg | DELAYED_RELEASE_TABLET | Freq: Every day | ORAL | Status: DC
  Administered 2021-08-20 – 2021-08-21 (×2): 81 mg via ORAL
  Filled 2021-08-19 (×2): qty 1

## 2021-08-19 MED ORDER — SODIUM CHLORIDE 0.9 % IV BOLUS
500.0000 mL | Freq: Once | INTRAVENOUS | Status: AC
  Administered 2021-08-19: 15:00:00 500 mL via INTRAVENOUS

## 2021-08-19 NOTE — ED Notes (Signed)
Pt to CT at this time.

## 2021-08-19 NOTE — ED Notes (Signed)
Patient is a difficult stick. Unable to use left arm due to AV Fistula. RN attempted to start in IV in right arm. Patient moved immediately and says her arm is sore. Unable to obtain additional Blood cultures at tihs time

## 2021-08-19 NOTE — Progress Notes (Signed)
Called the ED nurse Larene Beach for report but she is not available ,She said she will call back

## 2021-08-19 NOTE — ED Triage Notes (Signed)
Patient was sent by her PCP for evaluation and possible admission for nursing home placement. Patient sister states patient has "a boil" on her left leg where her prosthetic is on her leg along with one on her bottom. Patient is dialysis pt (M/W/F) with last treatment yesterday. Sister reports patient not wanting to eat and has been losing weight and has been voiding on herself. States patient was admitted then sent to [facility] rehab and has been home for a few weeks but has been going downhill since.

## 2021-08-19 NOTE — ED Notes (Signed)
Lab draw x 2 unsuccessful  

## 2021-08-19 NOTE — Progress Notes (Signed)
Patient ID: Carolyn Valenzuela, female   DOB: March 21, 1951, 70 y.o.   MRN: [id number]        Chief Complaint: follow up family essentially unable to care for her further       HPI:  Carolyn Valenzuela is a 70 y.o. female here with c/o pt overall worsening 1-2 wks generalized weakness and now persistent incontinence now too difficult to handle at home even with a niece who cares for her in the home full time.  Pt denies chest pain, increased sob or doe, wheezing, orthopnea, PND, increased LE swelling, palpitations, dizziness or syncope. No recent falls but high risk..  No fever chills.  Pt unable to handle her ADLs and only minimal assist.  Has had worsening po intake and now lower BP.  Sleeps most of the day and night       Wt Readings from Last 3 Encounters:  08/20/21 111 lb 15.9 oz (50.8 kg)  07/01/21 110 lb 7.2 oz (50.1 kg)  04/22/21 134 lb 3.2 oz (60.9 kg)   BP Readings from Last 3 Encounters:  08/20/21 (!) 109/53  08/19/21 (!) 106/52  07/01/21 (!) 145/80         Past Medical History:  Diagnosis Date   Allergic rhinitis, cause unspecified 12/08/2010   ANEMIA-IRON DEFICIENCY 01/25/2008   Aortic regurgitation    CAD (coronary artery disease)    a. 07/2016 NSTEMI/PCI: LM nl, LAD 70ost, 69m/d, 80d, D1 40ost, LCX 99ost/p (2.5x16 Synergy DES - 2.75), 40/8m, Om2 100 CTO, RCA  30p/m. Pt eval by CT surgery, not felt to be suitable candidate 2/2 comorbidities.   CERVICAL RADICULOPATHY, LEFT 9/62/8366   Chronic systolic CHF (congestive heart failure) ([place])    a. 07/2016 Echo: EF 40-45%, diff HK, sev AI, mild MVP w/ mod to sev MR, PASP 61mmHg.   Critical lower limb ischemia ([place])    DIABETES MELLITUS, UNCONTROLLED 05/21/2009   Esophagitis    ESRD on dialysis [facility])    Foot ulcer ([place])    right lateral malleolus   HCAP (healthcare-associated pneumonia) 06/29/2016   HYPERLIPIDEMIA 03/30/2007   HYPERTENSION 01/25/2008   Ischemic cardiomyopathy    a. 07/2016 Echo: EF 40-45%.   Mitral regurgitation    a.  07/2016 Echo: Mild MVP w/ mod to sev MR.   PVD (peripheral vascular disease) ([place])    a. s/p bilat BKA   SECONDARY HYPERPARATHYROIDISM 05/21/2009   Severe aortic regurgitation    a. 07/2016 Echo: Severe AI.   Past Surgical History:  Procedure Laterality Date   AMPUTATION Right 03/15/2014   Procedure: RIGHT  LEG  BELOW KNEE AMPUTATION ;  Surgeon: [name], MD;  Location: [place];  Service: Orthopedics;  Laterality: Right;   AMPUTATION Left 11/29/2015   Procedure: AMPUTATION BELOW KNEE;  Surgeon: Newt Minion, MD;  Location: [place];  Service: Orthopedics;  Laterality: Left;   AV FISTULA PLACEMENT Left    AV FISTULA PLACEMENT Left 09/06/2014   Procedure: ARTERIOVENOUS (AV) FISTULA CREATION-Left Brachiocephalic;  Surgeon: Conrad Erie, MD;  Location: [place];  Service: Vascular;  Laterality: Left;   CARDIAC CATHETERIZATION N/A 06/30/2016   Procedure: Left Heart Cath and Coronary Angiography;  Surgeon: Burnell Blanks, MD;  Location: [place] [facility];  Service: Cardiovascular;  Laterality: N/A;   CARDIAC CATHETERIZATION N/A 07/02/2016   Procedure: Coronary Stent Intervention;  Surgeon: Nelva Bush, MD;  Location: [place] [facility];  Service: Cardiovascular;  Laterality: N/A;   CATARACT EXTRACTION  COLONOSCOPY     CORONARY ANGIOPLASTY WITH STENT PLACEMENT  07/02/2016   ESOPHAGOGASTRODUODENOSCOPY N/A 03/20/2016   Procedure: ESOPHAGOGASTRODUODENOSCOPY (EGD);  Surgeon: Milus Banister, MD;  Location: [place];  Service: Endoscopy;  Laterality: N/A;   EYE SURGERY Bilateral    cataracts removed, left eye still has some oil in it.   INSERTION OF DIALYSIS CATHETER N/A 09/03/2014   Procedure: INSERTION OF DIALYSIS CATHETER RIGHT INTERNAL JUGULAR VEIN;  Surgeon: Conrad Whitesville, MD;  Location: [place];  Service: Vascular;  Laterality: N/A;   PERIPHERAL VASCULAR CATHETERIZATION N/A 08/13/2015   Procedure: Abdominal Aortogram;  Surgeon: Elam Dutch, MD;  Location: [place] [facility];   Service: Cardiovascular;  Laterality: N/A;    reports that she has never smoked. She has never used smokeless tobacco. She reports that she does not drink alcohol and does not use drugs. family history includes Cancer in her mother. Allergies  Allergen Reactions   Oxycodone Itching   No current facility-administered medications on file prior to visit.   Current Outpatient Medications on File Prior to Visit  Medication Sig Dispense Refill   acetaminophen (TYLENOL) 500 MG tablet Take 500 mg by mouth every 6 (six) hours as needed for mild pain.     albuterol (VENTOLIN HFA) 108 (90 Base) MCG/ACT inhaler Inhale 2 puffs into the lungs every 6 (six) hours as needed for wheezing or shortness of breath. 8 g 5   ALPHAGAN P 0.1 % SOLN Place 1 drop into both eyes 3 (three) times daily.     amLODipine (NORVASC) 10 MG tablet TAKE 1 TABLET BY MOUTH EVERYDAY AT BEDTIME 30 tablet 0   aspirin 81 MG EC tablet Take 81 mg by mouth daily.     cinacalcet (SENSIPAR) 30 MG tablet Take 1 tablet (30 mg total) by mouth every Monday, Wednesday, and Friday at 6 PM. 60 tablet    clopidogrel (PLAVIX) 75 MG tablet Take 1 tablet (75 mg total) by mouth daily. 90 tablet 3   gabapentin (NEURONTIN) 100 MG capsule TAKE 2 CAPSULES BY MOUTH 3 TIMES A DAY 540 capsule 1   loperamide (IMODIUM) 2 MG capsule Take 1 capsule (2 mg total) by mouth every 4 (four) hours as needed for diarrhea or loose stools. 30 capsule 0   multivitamin (RENA-VIT) TABS tablet TAKE 1 TABLET BY MOUTH EVERYDAY AT BEDTIME 30 tablet 0   nebivolol (BYSTOLIC) 5 MG tablet Take 1 tablet (5 mg total) by mouth daily. 90 tablet 3   pantoprazole (PROTONIX) 40 MG tablet TAKE 1 TABLET BY MOUTH TWICE A DAY (Patient taking differently: Take 40 mg by mouth daily.) 180 tablet 1   RESTASIS 0.05 % ophthalmic emulsion Place 1 drop into both eyes 2 (two) times daily.     Methoxy PEG-Epoetin Beta (MIRCERA IJ) Mircera (Patient not taking: Reported on 08/19/2021)     ondansetron  (ZOFRAN) 4 MG tablet Take 1 tablet (4 mg total) by mouth every 8 (eight) hours as needed for nausea. (Patient not taking: Reported on 08/19/2021) 20 tablet 0   psyllium (HYDROCIL/METAMUCIL) 95 % PACK Take 1 packet by mouth daily. (Patient not taking: Reported on 08/19/2021) 240 each    VELPHORO 500 MG chewable tablet Chew 500 mg by mouth 2 (two) times daily. (Patient not taking: Reported on 08/19/2021)          ROS:  All others reviewed and negative.  Objective        PE:  BP (!) 106/52 (BP Location: Right Arm, Patient  Position: Sitting, Cuff Size: Normal)    Pulse 71    Temp 98.4 F (36.9 C) (Oral)    Ht 5' (1.524 m)    SpO2 95%    BMI 21.57 kg/m                 Constitutional: Pt appears in NAD but slumped in wheelchair, weak               HENT: Head: NCAT.                Right Ear: External ear normal.                 Left Ear: External ear normal.                Eyes: . Pupils are equal, round, and reactive to light. Conjunctivae and EOM are normal               Nose: without d/c or deformity               Neck: Neck supple. Gross normal ROM               Cardiovascular: Normal rate and regular rhythm.                 Pulmonary/Chest: Effort normal and breath sounds without rales or wheezing.                Abd:  Soft, NT, ND, + BS, no organomegaly               Neurological: Pt is alert. At baseline orientation, motor grossly intact               Skin: Skin is warm. No rashes, no other new lesions, LE edema - none               Psychiatric: Pt behavior is normal without agitation   Micro: none  Cardiac tracings I have personally interpreted today:  none  Pertinent Radiological findings (summarize): none   Lab Results  Component Value Date   WBC 9.3 08/20/2021   HGB 7.8 (L) 08/20/2021   HCT 22.2 (L) 08/20/2021   PLT 117 (L) 08/20/2021   GLUCOSE 147 (H) 08/20/2021   CHOL 82 02/13/2021   TRIG 59.0 02/13/2021   HDL 39.80 02/13/2021   LDLDIRECT 86.0 04/06/2017   LDLCALC 30  02/13/2021   ALT 12 08/20/2021   AST 16 08/20/2021   NA 131 (L) 08/20/2021   K 3.6 08/20/2021   CL 100 08/20/2021   CREATININE 2.56 (H) 08/20/2021   BUN 12 08/20/2021   CO2 19 (L) 08/20/2021   TSH 4.402 06/26/2021   INR 2.1 (H) 08/20/2021   HGBA1C 5.4 06/25/2021   MICROALBUR 108.1 (H) 09/15/2016   Assessment/Plan:  JEARLDINE CASSADY is a 70 y.o. Black or African American [2] female with  has a past medical history of Allergic rhinitis, cause unspecified (12/08/2010), ANEMIA-IRON DEFICIENCY (01/25/2008), Aortic regurgitation, CAD (coronary artery disease), CERVICAL RADICULOPATHY, LEFT (5/40/0867), Chronic systolic CHF (congestive heart failure) ([place]), Critical lower limb ischemia ([place]), DIABETES MELLITUS, UNCONTROLLED (05/21/2009), Esophagitis, ESRD on dialysis ([place]), Foot ulcer ([place]), HCAP (healthcare-associated pneumonia) (06/29/2016), HYPERLIPIDEMIA (03/30/2007), HYPERTENSION (01/25/2008), Ischemic cardiomyopathy, Mitral regurgitation, PVD (peripheral vascular disease) ([place]), SECONDARY HYPERPARATHYROIDISM (05/21/2009), and Severe aortic regurgitation.  FTT (failure to thrive) in adult Difficult historian, now with worsening ability to care for herself and beyond family as well, may have underlying ill defined acute  illness I suspect, so I asked family to take her to ED for further consdieration for possible admit for placement and any acute illness tx  Gait disorder With recent worsening, likely will need PT  Essential hypertension Now low normal, cant r/o sepsis, for ED as above  Type 2 diabetes mellitus with diabetic chronic kidney disease ([place]) Lab Results  Component Value Date   HGBA1C 5.4 06/25/2021   Stable, pt to continue current medical treatment  - diet  Followup: Return if symptoms worsen or fail to improve.  Cathlean Cower, MD 08/20/2021 5:05 AM [facility]

## 2021-08-19 NOTE — ED Notes (Signed)
Contacted [facility] a 2nd time and was told that RN receiving was with a patient and was unable to take report. Will [employer] of delay.

## 2021-08-19 NOTE — ED Notes (Signed)
Date and time results received: 08/19/21 1426 (use smartphrase ".now" to insert current time)  Test: Lactate Critical Value: 3.2  Name of Provider Notified: Karel Jarvis

## 2021-08-19 NOTE — Sepsis Progress Note (Signed)
Code Sepsis protocol being monitored by [facility]. 

## 2021-08-19 NOTE — ED Notes (Signed)
Pt back from CT at this time 

## 2021-08-19 NOTE — Patient Instructions (Signed)
Please go to [facility] ER such as at [facility] for further evaluation and possible admission for nursing home placement

## 2021-08-19 NOTE — Sepsis Progress Note (Signed)
Pt has been difficult stick and are unable to get the repeat lactic acid drawn.

## 2021-08-19 NOTE — Sepsis Progress Note (Signed)
Notified bedside nurse of need to draw repeat lactic acid. 

## 2021-08-19 NOTE — Sepsis Progress Note (Signed)
Notified provider of need to order repeat lactic acid. ° °

## 2021-08-19 NOTE — ED Notes (Signed)
[facility] contacted for transport to [facility]

## 2021-08-19 NOTE — ED Provider Notes (Signed)
[facility] DEPT Provider Note   CSN: [id number] Arrival date & time: 08/19/21  1139     History Chief Complaint  Patient presents with   Weakness    Carolyn Valenzuela is a 70 y.o. female with a past medical history significant for diabetes, ESRD on dialysis Monday/Wednesday/Friday, hypertension, hyperlipidemia, CAD, PVD, and chronic systolic CHF who presents to the ED due to altered mental status and generalized weakness.  Niece at bedside and provided history.  Niece notes that patient has been relatively weak for the past 4 days.  She has been urinating on herself due to generalized weakness which is atypical for patient. Patient denies chest pain and shortness of breath.  No cough, fever, sore throat, or nasal congestion.  Patient denies abdominal pain, nausea, vomiting, diarrhea.  Patient was admitted to the hospital on 10/26-11/1 due to cholelithiasis without cholecystitis.  Patient was then discharged to [facility] and has been home for a few weeks.  Niece is also worried about her right stump and low back area with concerns about a possible infection. No treatment prior to arrival. No aggravating or alleviating factors.  Level 5 caveat secondary to altered mental status.  History obtained from patient, niece, and past medical records. No interpreter used during encounter.       Past Medical History:  Diagnosis Date   Allergic rhinitis, cause unspecified 12/08/2010   ANEMIA-IRON DEFICIENCY 01/25/2008   Aortic regurgitation    CAD (coronary artery disease)    a. 07/2016 NSTEMI/PCI: LM nl, LAD 70ost, 14m/d, 80d, D1 40ost, LCX 99ost/p (2.5x16 Synergy DES - 2.75), 40/19m, Om2 100 CTO, RCA  30p/m. Pt eval by CT surgery, not felt to be suitable candidate 2/2 comorbidities.   CERVICAL RADICULOPATHY, LEFT 5/46/5681   Chronic systolic CHF (congestive heart failure) ([place])    a. 07/2016 Echo: EF 40-45%, diff HK, sev AI, mild MVP w/ mod to sev MR, PASP 65mmHg.    Critical lower limb ischemia ([place])    DIABETES MELLITUS, UNCONTROLLED 05/21/2009   Esophagitis    ESRD on dialysis [facility])    Foot ulcer ([place])    right lateral malleolus   HCAP (healthcare-associated pneumonia) 06/29/2016   HYPERLIPIDEMIA 03/30/2007   HYPERTENSION 01/25/2008   Ischemic cardiomyopathy    a. 07/2016 Echo: EF 40-45%.   Mitral regurgitation    a. 07/2016 Echo: Mild MVP w/ mod to sev MR.   PVD (peripheral vascular disease) ([place])    a. s/p bilat BKA   SECONDARY HYPERPARATHYROIDISM 05/21/2009   Severe aortic regurgitation    a. 07/2016 Echo: Severe AI.    Patient Active Problem List   Diagnosis Date Noted   CAP (community acquired pneumonia) 08/19/2021   Physical deconditioning 06/29/2021   Cholelithiases 06/26/2021   Choledocholithiasis 06/25/2021   Lump of right breast 02/16/2021   Gait disorder 02/16/2021   Finger pain 02/16/2021   Right shoulder pain 02/16/2021   Other disorders of plasma-protein metabolism, not elsewhere classified 01/13/2021   Encounter for screening for COVID-19 04/22/2020   Allergy, unspecified, initial encounter 03/28/2020   Anaphylactic shock, unspecified, initial encounter 03/28/2020   Chronic low back pain 03/12/2020   Closed displaced fracture of shaft of fifth metacarpal bone of left hand 04/25/2019   Left hip pain 11/18/2017   GERD (gastroesophageal reflux disease) 10/10/2017   Bloating 10/10/2017   PVD (peripheral vascular disease) ([place])    Phantom pain after amputation of lower extremity ([place]) 01/05/2017   Encounter for removal of sutures 12/01/2016  Abnormal CXR 07/07/2016   Pneumonia, unspecified organism 07/07/2016   Hypotension    NSTEMI (non-ST elevated myocardial infarction) ([place])    Encounter for immunization 05/27/2016   Nausea and vomiting 03/18/2016   Hypokalemia 01/31/2016   Sepsis ([place]) due to UTI 12/20/2015   Acute lower UTI    Labile blood pressure    SIRS (systemic inflammatory response syndrome) ([place])     Hypoglycemia associated with diabetes ([place])    Urinary retention    Dysphagia    S/P bilateral BKA (below knee amputation) ([place])    Abnormality of gait    Muscle spasm    ESRD on dialysis ([place])    Type 2 diabetes mellitus with diabetic peripheral angiopathy and gangrene, without long-term current use of insulin (HCC)    Post-operative pain    Metabolic bone disease    Hypoxia    Type 2 diabetes mellitus with peripheral neuropathy (HCC)    Labile blood glucose    Postoperative pain    Acute blood loss anemia    Anemia of chronic disease    Below knee amputation status 11/29/2015   Odynophagia 11/12/2015   Unspecified disorder of calcium metabolism 07/31/2015   Chest pain, atypical 06/06/2015   Ingrown nail 05/28/2015   Other fluid overload 02/15/2015   Shortness of breath 12/13/2014   Diarrhea, unspecified 12/13/2014   Pain, unspecified 12/13/2014   Pruritus, unspecified 12/13/2014   Unspecified protein-calorie malnutrition ([place]) 10/17/2014   Acute pulmonary edema (HCC)    FUO (fever of unknown origin)    Complication of vascular dialysis catheter 09/10/2014   Fever, unspecified 09/10/2014   HCAP (healthcare-associated pneumonia)    Coagulation defect, unspecified ([place]) 09/06/2014   Radiculopathy, cervical region 09/06/2014   End stage renal disease ([place]) 09/06/2014   Essential (primary) hypertension 09/06/2014   Hyperlipidemia, unspecified 09/06/2014   Encounter for screening for respiratory tuberculosis 09/06/2014   Secondary hyperparathyroidism of renal origin ([place]) 09/06/2014   Anemia in chronic kidney disease 09/06/2014   Type 2 diabetes mellitus with ESRD (end-stage renal disease) ([place]) 09/01/2014   Anemia of renal disease 09/01/2014   Venous insufficiency of left leg 08/27/2014   Rash 07/06/2014   Diabetic wet gangrene of the foot - Right 03/15/2014   Critical lower limb ischemia ([place]) 01/09/2014   Right foot ulcer ([place]) 11/07/2013   Toe pain 09/06/2013    Pre-ulcerative corn or callous 09/06/2013   Lower extremity pain 09/06/2013   Low back pain 12/08/2011   Hip bursitis, left 12/08/2011   Left leg pain 12/08/2011   Abdominal pain 07/08/2011   Encounter for well adult exam with abnormal findings 12/08/2010   Allergic rhinitis, cause unspecified 12/08/2010   Type 2 diabetes mellitus with diabetic chronic kidney disease ([place]) 05/21/2009   SECONDARY HYPERPARATHYROIDISM 05/21/2009   NUMBNESS 05/21/2009   BACK PAIN 05/02/2009   ANEMIA-IRON DEFICIENCY 01/25/2008   Essential hypertension 01/25/2008   Brachial neuritis or radiculitis 01/25/2008   Hyperlipidemia 03/30/2007    Past Surgical History:  Procedure Laterality Date   AMPUTATION Right 03/15/2014   Procedure: RIGHT  LEG  BELOW KNEE AMPUTATION ;  Surgeon: [name], MD;  Location: [facility];  Service: Orthopedics;  Laterality: Right;   AMPUTATION Left 11/29/2015   Procedure: AMPUTATION BELOW KNEE;  Surgeon: Newt Minion, MD;  Location: [facility];  Service: Orthopedics;  Laterality: Left;   AV FISTULA PLACEMENT Left    AV FISTULA PLACEMENT Left 09/06/2014   Procedure: ARTERIOVENOUS (AV) FISTULA CREATION-Left Brachiocephalic;  Surgeon: Jannette Fogo  Bridgett Larsson, MD;  Location: [facility];  Service: Vascular;  Laterality: Left;   CARDIAC CATHETERIZATION N/A 06/30/2016   Procedure: Left Heart Cath and Coronary Angiography;  Surgeon: Burnell Blanks, MD;  Location: [facility];  Service: Cardiovascular;  Laterality: N/A;   CARDIAC CATHETERIZATION N/A 07/02/2016   Procedure: Coronary Stent Intervention;  Surgeon: Nelva Bush, MD;  Location: [facility];  Service: Cardiovascular;  Laterality: N/A;   CATARACT EXTRACTION     COLONOSCOPY     CORONARY ANGIOPLASTY WITH STENT PLACEMENT  07/02/2016   ESOPHAGOGASTRODUODENOSCOPY N/A 03/20/2016   Procedure: ESOPHAGOGASTRODUODENOSCOPY (EGD);  Surgeon: Milus Banister, MD;  Location: [facility];  Service: Endoscopy;  Laterality: N/A;   EYE SURGERY  Bilateral    cataracts removed, left eye still has some oil in it.   INSERTION OF DIALYSIS CATHETER N/A 09/03/2014   Procedure: INSERTION OF DIALYSIS CATHETER RIGHT INTERNAL JUGULAR VEIN;  Surgeon: Conrad Etowah, MD;  Location: [facility];  Service: Vascular;  Laterality: N/A;   PERIPHERAL VASCULAR CATHETERIZATION N/A 08/13/2015   Procedure: Abdominal Aortogram;  Surgeon: Elam Dutch, MD;  Location: [facility];  Service: Cardiovascular;  Laterality: N/A;     OB History   No obstetric history on file.     Family History  Problem Relation Age of Onset   Cancer Mother        Pancreatic    Social History   Tobacco Use   Smoking status: Never   Smokeless tobacco: Never  Vaping Use   Vaping Use: Never used  Substance Use Topics   Alcohol use: No    Alcohol/week: 0.0 standard drinks   Drug use: No    Home Medications Prior to Admission medications   Medication Sig Start Date End Date Taking? Authorizing Provider  acetaminophen (TYLENOL) 500 MG tablet Take 500 mg by mouth every 6 (six) hours as needed for mild pain.    [provider]  albuterol (VENTOLIN HFA) 108 (90 Base) MCG/ACT inhaler Inhale 2 puffs into the lungs every 6 (six) hours as needed for wheezing or shortness of breath. 02/13/21   Biagio Borg, MD  ALPHAGAN P 0.1 % SOLN Place 1 drop into both eyes 3 (three) times daily. 11/18/20   [provider]  amLODipine (NORVASC) 10 MG tablet TAKE 1 TABLET BY MOUTH EVERYDAY AT BEDTIME 08/05/21   Biagio Borg, MD  aspirin 81 MG EC tablet Take 81 mg by mouth daily.    [provider]  cinacalcet (SENSIPAR) 30 MG tablet Take 1 tablet (30 mg total) by mouth every Monday, Wednesday, and Friday at 6 PM. 06/30/21   Mercy Riding, MD  clopidogrel (PLAVIX) 75 MG tablet Take 1 tablet (75 mg total) by mouth daily. 01/14/21   Almyra Deforest, PA  gabapentin (NEURONTIN) 100 MG capsule TAKE 2 CAPSULES BY MOUTH 3 TIMES A DAY 08/16/21   Biagio Borg, MD  loperamide  (IMODIUM) 2 MG capsule Take 1 capsule (2 mg total) by mouth every 4 (four) hours as needed for diarrhea or loose stools. 06/30/21   Mercy Riding, MD  Methoxy PEG-Epoetin Beta (MIRCERA IJ) Mircera Patient not taking: Reported on 08/19/2021 07/29/20 12/08/21  [provider]  multivitamin (RENA-VIT) TABS tablet TAKE 1 TABLET BY MOUTH EVERYDAY AT BEDTIME 08/05/21   Biagio Borg, MD  nebivolol (BYSTOLIC) 5 MG tablet Take 1 tablet (5 mg total) by mouth daily. 09/12/20   Lorretta Harp, MD  ondansetron [facility])  4 MG tablet Take 1 tablet (4 mg total) by mouth every 8 (eight) hours as needed for nausea. Patient not taking: Reported on 08/19/2021 06/30/21   Mercy Riding, MD  pantoprazole (PROTONIX) 40 MG tablet TAKE 1 TABLET BY MOUTH TWICE A DAY Patient taking differently: Take 40 mg by mouth daily. 03/24/21   Biagio Borg, MD  psyllium (HYDROCIL/METAMUCIL) 95 % PACK Take 1 packet by mouth daily. Patient not taking: Reported on 08/19/2021 07/01/21   Mercy Riding, MD  RESTASIS 0.05 % ophthalmic emulsion Place 1 drop into both eyes 2 (two) times daily. 08/29/20   [provider]  VELPHORO 500 MG chewable tablet Chew 500 mg by mouth 2 (two) times daily. Patient not taking: Reported on 08/19/2021 03/04/20   [provider]    Allergies    Oxycodone  Review of Systems   Review of Systems  Unable to perform ROS: Mental status change   Physical Exam Updated Vital Signs BP (!) 115/53 (BP Location: Right Arm)    Pulse 68    Temp 100.2 F (37.9 C) (Rectal)    Resp 14    Ht 5' (1.524 m)    Wt 49.9 kg    SpO2 95%    BMI 21.48 kg/m   Physical Exam Vitals and nursing note reviewed.  Constitutional:      General: She is not in acute distress.    Appearance: She is not ill-appearing.  HENT:     Head: Normocephalic.  Eyes:     Pupils: Pupils are equal, round, and reactive to light.  Cardiovascular:     Rate and Rhythm: Normal rate and regular rhythm.     Pulses: Normal  pulses.     Heart sounds: Normal heart sounds. No murmur heard.   No friction rub. No gallop.  Pulmonary:     Effort: Pulmonary effort is normal.     Breath sounds: Normal breath sounds.  Abdominal:     General: Abdomen is flat. There is no distension.     Palpations: Abdomen is soft.     Tenderness: There is no abdominal tenderness. There is no guarding or rebound.  Musculoskeletal:        General: Normal range of motion.     Cervical back: Neck supple.  Skin:    General: Skin is warm and dry.     Comments: Skin tear to right stump. No purulent drainage. Pressure sore to low back. No evidence of infection.   Neurological:     General: No focal deficit present.     Mental Status: She is alert.     Comments: Cranial nerves grossly intact, normal speech, no facial droop  Psychiatric:        Mood and Affect: Mood normal.        Behavior: Behavior normal.    ED Results / Procedures / Treatments   Labs (all labs ordered are listed, but only abnormal results are displayed) Labs Reviewed  CBC WITH DIFFERENTIAL/PLATELET - Abnormal; Notable for the following components:      Result Value   WBC 17.8 (*)    RBC 3.68 (*)    Hemoglobin 11.2 (*)    HCT 33.5 (*)    RDW 18.1 (*)    Neutro Abs 14.7 (*)    Monocytes Absolute 1.3 (*)    Abs Immature Granulocytes 0.10 (*)    All other components within normal limits  COMPREHENSIVE METABOLIC PANEL - Abnormal; Notable for the following components:  Sodium 130 (*)    Chloride 95 (*)    Glucose, Bld 299 (*)    Creatinine, Ser 3.12 (*)    Calcium 8.1 (*)    Total Protein 6.1 (*)    Albumin 2.2 (*)    Total Bilirubin 1.8 (*)    GFR, Estimated 16 (*)    All other components within normal limits  LACTIC ACID, PLASMA - Abnormal; Notable for the following components:   Lactic Acid, Venous 3.2 (*)    All other components within normal limits  CBG MONITORING, ED - Abnormal; Notable for the following components:   Glucose-Capillary 292 (*)     All other components within normal limits  RESP PANEL BY RT-PCR (FLU A&B, COVID) ARPGX2  CULTURE, BLOOD (ROUTINE X 2)  CULTURE, BLOOD (ROUTINE X 2)  URINE CULTURE  AMMONIA  URINALYSIS, ROUTINE W REFLEX MICROSCOPIC  PROTIME-INR  APTT    EKG None  Radiology DG Chest 2 View  Result Date: 08/19/2021 CLINICAL DATA:  Altered mental status. EXAM: CHEST - 2 VIEW COMPARISON:  CT chest and chest x-ray dated June 25, 2021. FINDINGS: Unchanged cardiomegaly. New patchy consolidation in the left greater than right upper lobes. Increased small bilateral pleural effusions with bibasilar atelectasis. No pneumothorax. No acute osseous abnormality. Unchanged left subclavian and axillary stents. IMPRESSION: 1. New bilateral upper lobe pneumonia. 2. Increased small bilateral pleural effusions. Electronically Signed   By: Titus Dubin M.D.   On: 08/19/2021 12:53    Procedures .Critical Care Performed by: Suzy Bouchard, PA-C Authorized by: Suzy Bouchard, PA-C   Critical care provider statement:    Critical care time (minutes):  38   Critical care was necessary to treat or prevent imminent or life-threatening deterioration of the following conditions:  Sepsis   Critical care was time spent personally by me on the following activities:  Development of treatment plan with patient or surrogate, discussions with consultants, evaluation of patient's response to treatment, examination of patient, ordering and review of laboratory studies, ordering and review of radiographic studies, ordering and performing treatments and interventions, pulse oximetry, re-evaluation of patient's condition and review of old charts   I assumed direction of critical care for this patient from another provider in my specialty: no     Care discussed with: admitting provider     Medications Ordered in ED Medications  cefTRIAXone (ROCEPHIN) 1 g in sodium chloride 0.9 % 100 mL IVPB (has no administration in time range)   azithromycin (ZITHROMAX) 500 mg in sodium chloride 0.9 % 250 mL IVPB (has no administration in time range)  sodium chloride 0.9 % bolus 500 mL (has no administration in time range)  lactated ringers infusion (has no administration in time range)    ED Course  I have reviewed the triage vital signs and the nursing notes.  Pertinent labs & imaging results that were available during my care of the patient were reviewed by me and considered in my medical decision making (see chart for details).  Clinical Course as of 08/19/21 1449  Tue Aug 19, 2021  1325 Glucose-Capillary(!): 292 [CA]  1325 WBC(!): 17.8 [CA]  1424 Lactic Acid, Venous(!!): 3.2 [CA]    Clinical Course User Index [CA] Suzy Bouchard, PA-C   MDM Rules/Calculators/A&P                         70 year old female presents to the ED due to altered mental status and generalized weakness x4 days.  Patient's niece has been living with her who notes deterioration in condition apart from patient's baseline.  Upon arrival, patient afebrile, not tachycardic or hypoxic.  Patient in no acute distress.  Benign physical exam.  Abdomen soft, nondistended, nontender.  Lungs clear to auscultation bilaterally.  Skin tear to right stump.  Hyperpigmentation to low back without signs of infection.  Labs ordered at triage.  Chest x-ray and CT also ordered.  Chest x-ray demonstrates bilateral upper lobe pneumonia.  IV antibiotics started.  IV fluids given as well.  CBC significant for leukocytosis at 17.8.  Anemia with hemoglobin 11.2.  Hyperglycemia 292 without evidence of DKA. CT head negative for intracranial hemorrhage.    2:24 PM Informed by RN that patient's lactic acid elevated at 3.2. With leukocytosis and pneumonia on CXR. Code sepsis initiated. Patient remains hemodynamically stable without evidence of severe hypotension. Will hydrate gently due to CHF. Discussed with Dr. Regenia Skeeter who agrees with assessment and plan.   2:48 PM Discussed  with Dr. Olevia Bowens with [facility] who agrees to admit patient for further treatment. COVID negative.   Final Clinical Impression(s) / ED Diagnoses Final diagnoses:  Generalized weakness    Rx / DC Orders ED Discharge Orders     None        Suzy Bouchard, PA-C 08/19/21 1452    [place] Gambler, MD 08/19/21 [phone]

## 2021-08-19 NOTE — H&P (Signed)
History and Physical    Carolyn Valenzuela TWS:[id number] DOB: 09-09-1950 DOA: 08/19/2021  PCP: Biagio Borg, MD   Patient coming from: Home.   I have personally briefly reviewed patient's old medical records in [place]  Chief Complaint: Generalized weakness.  HPI: Carolyn Valenzuela is a 70 y.o. female with medical history significant of seasonal allergies, iron deficiency anemia, arctic regurgitation, CAD, left cervical radiculopathy, history of critical lower limb ischemia, bilateral BKA, uncontrolled type II DM, esophagitis, ESRD on HD, history of HCAP, hyperlipidemia, hypertension, ischemic cardiomyopathy, mitral regurgitation, peripheral vascular disease, secondary hyperparathyroidism who is sent by her PCP for evaluation and admission to a nursing home.  She also has a wound on her left stump.  She has had decreased appetite and weight loss.  Her generalized weakness has become worse.  She has been home for a few weeks after being discharged to [facility] rehab where she spent about 20 days after her last admission in October.  She is able to answer simple questions denied headache, chest pain, back or abdominal pain at this time.  ED Course: Initial vital signs were temperature 98.9 F, pulse 67, respiration 18, BP 122/53 mmHg O2 sat 93% on room air.  The patient was given a 500 mL NS bolus, ceftriaxone and Zithromax in the emergency department.  Lab work: Her CBC showed a white count of 17.8, hemoglobin 11.2 g/dL platelets 173.  Lactic acid was 3.2 mmol/L.  Ammonia was 25 mol/L.  Coronavirus and influenza PCR was negative.  CMP showed a sodium 130 and chloride of 95 mmol/L.  The rest of the electrolytes were normal when calcium corrected to albumin.  Glucose 299, creatinine 3.12 and total bilirubin 1.8 mg/dL.  Total protein 6.1 and albumin 2.2 g/dL.  Transaminases were normal.  Imaging: 2 view chest radiograph showed new bilateral upper lobe pneumonia with increased small bilateral  pleural effusion.  CT head without contrast did not show any evidence of acute intracranial normality.  However since 2020 there has been progressive atrophy and ex vacuo ventricular dilation.  There was left mastoid effusion.  Please see images and full radiology report for further details.  Review of Systems: As per HPI otherwise all other systems reviewed and are negative.  Past Medical History:  Diagnosis Date   Allergic rhinitis, cause unspecified 12/08/2010   ANEMIA-IRON DEFICIENCY 01/25/2008   Aortic regurgitation    CAD (coronary artery disease)    a. 07/2016 NSTEMI/PCI: LM nl, LAD 70ost, 44m/d, 80d, D1 40ost, LCX 99ost/p (2.5x16 Synergy DES - 2.75), 40/49m, Om2 100 CTO, RCA  30p/m. Pt eval by CT surgery, not felt to be suitable candidate 2/2 comorbidities.   CERVICAL RADICULOPATHY, LEFT 0/08/7492   Chronic systolic CHF (congestive heart failure) ([place])    a. 07/2016 Echo: EF 40-45%, diff HK, sev AI, mild MVP w/ mod to sev MR, PASP 104mmHg.   Critical lower limb ischemia ([place])    DIABETES MELLITUS, UNCONTROLLED 05/21/2009   Esophagitis    ESRD on dialysis [facility])    Foot ulcer ([place])    right lateral malleolus   HCAP (healthcare-associated pneumonia) 06/29/2016   HYPERLIPIDEMIA 03/30/2007   HYPERTENSION 01/25/2008   Ischemic cardiomyopathy    a. 07/2016 Echo: EF 40-45%.   Mitral regurgitation    a. 07/2016 Echo: Mild MVP w/ mod to sev MR.   PVD (peripheral vascular disease) ([place])    a. s/p bilat BKA   SECONDARY HYPERPARATHYROIDISM 05/21/2009   Severe aortic regurgitation  a. 07/2016 Echo: Severe AI.   Past Surgical History:  Procedure Laterality Date   AMPUTATION Right 03/15/2014   Procedure: RIGHT  LEG  BELOW KNEE AMPUTATION ;  Surgeon: [name], MD;  Location: [place];  Service: Orthopedics;  Laterality: Right;   AMPUTATION Left 11/29/2015   Procedure: AMPUTATION BELOW KNEE;  Surgeon: Newt Minion, MD;  Location: [place];  Service: Orthopedics;  Laterality: Left;   AV FISTULA  PLACEMENT Left    AV FISTULA PLACEMENT Left 09/06/2014   Procedure: ARTERIOVENOUS (AV) FISTULA CREATION-Left Brachiocephalic;  Surgeon: Conrad Parcelas Nuevas, MD;  Location: [place];  Service: Vascular;  Laterality: Left;   CARDIAC CATHETERIZATION N/A 06/30/2016   Procedure: Left Heart Cath and Coronary Angiography;  Surgeon: Burnell Blanks, MD;  Location: [place] [facility];  Service: Cardiovascular;  Laterality: N/A;   CARDIAC CATHETERIZATION N/A 07/02/2016   Procedure: Coronary Stent Intervention;  Surgeon: Nelva Bush, MD;  Location: [place] [facility];  Service: Cardiovascular;  Laterality: N/A;   CATARACT EXTRACTION     COLONOSCOPY     CORONARY ANGIOPLASTY WITH STENT PLACEMENT  07/02/2016   ESOPHAGOGASTRODUODENOSCOPY N/A 03/20/2016   Procedure: ESOPHAGOGASTRODUODENOSCOPY (EGD);  Surgeon: Milus Banister, MD;  Location: [place];  Service: Endoscopy;  Laterality: N/A;   EYE SURGERY Bilateral    cataracts removed, left eye still has some oil in it.   INSERTION OF DIALYSIS CATHETER N/A 09/03/2014   Procedure: INSERTION OF DIALYSIS CATHETER RIGHT INTERNAL JUGULAR VEIN;  Surgeon: Conrad Valley Green, MD;  Location: [place];  Service: Vascular;  Laterality: N/A;   PERIPHERAL VASCULAR CATHETERIZATION N/A 08/13/2015   Procedure: Abdominal Aortogram;  Surgeon: Elam Dutch, MD;  Location: [place] [facility];  Service: Cardiovascular;  Laterality: N/A;   Social History  reports that she has never smoked. She has never used smokeless tobacco. She reports that she does not drink alcohol and does not use drugs.  Allergies  Allergen Reactions   Oxycodone Itching   Family History  Problem Relation Age of Onset   Cancer Mother        Pancreatic   Prior to Admission medications   Medication Sig Start Date End Date Taking? Authorizing Provider  rosuvastatin (CRESTOR) 40 MG tablet Take 40 mg by mouth daily.   Yes [provider]  acetaminophen (TYLENOL) 500 MG tablet Take 500 mg by mouth  every 6 (six) hours as needed for mild pain.    [provider]  albuterol (VENTOLIN HFA) 108 (90 Base) MCG/ACT inhaler Inhale 2 puffs into the lungs every 6 (six) hours as needed for wheezing or shortness of breath. 02/13/21   Biagio Borg, MD  ALPHAGAN P 0.1 % SOLN Place 1 drop into both eyes 3 (three) times daily. 11/18/20   [provider]  amLODipine (NORVASC) 10 MG tablet TAKE 1 TABLET BY MOUTH EVERYDAY AT BEDTIME 08/05/21   Biagio Borg, MD  aspirin 81 MG EC tablet Take 81 mg by mouth daily.    [provider]  cinacalcet (SENSIPAR) 30 MG tablet Take 1 tablet (30 mg total) by mouth every Monday, Wednesday, and Friday at 6 PM. 06/30/21   Mercy Riding, MD  clopidogrel (PLAVIX) 75 MG tablet Take 1 tablet (75 mg total) by mouth daily. 01/14/21   Almyra Deforest, PA  gabapentin (NEURONTIN) 100 MG capsule TAKE 2 CAPSULES BY MOUTH 3 TIMES A DAY 08/16/21   Biagio Borg, MD  lidocaine-prilocaine (EMLA) cream SMARTSIG:Sparingly Topical 3 Times  a Week 06/27/21   [provider]  loperamide (IMODIUM) 2 MG capsule Take 1 capsule (2 mg total) by mouth every 4 (four) hours as needed for diarrhea or loose stools. 06/30/21   Mercy Riding, MD  Methoxy PEG-Epoetin Beta (MIRCERA IJ) Mircera Patient not taking: Reported on 08/19/2021 07/29/20 12/08/21  [provider]  multivitamin (RENA-VIT) TABS tablet TAKE 1 TABLET BY MOUTH EVERYDAY AT BEDTIME 08/05/21   Biagio Borg, MD  nebivolol (BYSTOLIC) 5 MG tablet Take 1 tablet (5 mg total) by mouth daily. 09/12/20   Lorretta Harp, MD  ondansetron (ZOFRAN) 4 MG tablet Take 1 tablet (4 mg total) by mouth every 8 (eight) hours as needed for nausea. Patient not taking: Reported on 08/19/2021 06/30/21   Mercy Riding, MD  pantoprazole (PROTONIX) 40 MG tablet TAKE 1 TABLET BY MOUTH TWICE A DAY Patient taking differently: Take 40 mg by mouth daily. 03/24/21   Biagio Borg, MD  psyllium (HYDROCIL/METAMUCIL) 95 % PACK Take 1 packet by  mouth daily. Patient not taking: Reported on 08/19/2021 07/01/21   Mercy Riding, MD  RESTASIS 0.05 % ophthalmic emulsion Place 1 drop into both eyes 2 (two) times daily. 08/29/20   [provider]  VELPHORO 500 MG chewable tablet Chew 500 mg by mouth 2 (two) times daily. Patient not taking: Reported on 08/19/2021 03/04/20   [provider]   Physical Exam: Vitals:   08/19/21 1400 08/19/21 1500 08/19/21 1547 08/19/21 1600  BP: (!) 115/53 (!) 111/51 (!) 117/50 (!) 114/54  Pulse: 68 65 66 66  Resp: 14 13 14 14   Temp: 100.2 F (37.9 C)   97.8 F (36.6 C)  TempSrc: Rectal   Oral  SpO2: 95% 94% 96% 96%  Weight:      Height:       Constitutional: Chronically ill-appearing.  NAD, calm, comfortable Eyes: PERRL, lids and conjunctivae normal ENMT: Stollings in place.  Mucous membranes are mildly dry.  Posterior pharynx clear of any exudate or lesions. Neck: normal, supple, no masses, no thyromegaly Respiratory: Decreased breath sounds with mild bilateral rhonchi, no wheezing or crackles. Normal respiratory effort. No accessory muscle use.  Cardiovascular: Regular rate and rhythm, no murmurs / rubs / gallops. No extremity edema. 2+ pedal pulses. No carotid bruits.  Positive LUE AV graft with good thrill. Abdomen: No distention.  Soft, no tenderness, no masses palpated. No hepatosplenomegaly. Bowel sounds positive.  Musculoskeletal: Right stump skin tear.  No purulent drainage.  No clubbing / cyanosis. Good ROM, no contractures. Normal muscle tone.  Skin: Stage I or II pressure skin injury of the sacral area.  Unable to fully evaluate. Neurologic: CN 2-12 grossly intact. Sensation intact, DTR normal. Strength 5/5 in all 4.  Psychiatric: Alert and oriented x 2, partially oriented to time, date and situation.  Able to answer some questions but her sister has to provide a lot of the history.  Labs on Admission: I have personally reviewed following labs and imaging studies  CBC: Recent  Labs  Lab 08/19/21 1258  WBC 17.8*  NEUTROABS 14.7*  HGB 11.2*  HCT 33.5*  MCV 91.0  PLT 607    Basic Metabolic Panel: Recent Labs  Lab 08/19/21 1258  NA 130*  K 3.6  CL 95*  CO2 24  GLUCOSE 299*  BUN 13  CREATININE 3.12*  CALCIUM 8.1*    GFR: Estimated Creatinine Clearance: 12.2 mL/min (A) (by C-G formula based on SCr of 3.12 mg/dL (H)).  Liver Function Tests: Recent Labs  Lab 08/19/21 1258  AST 24  ALT 15  ALKPHOS 92  BILITOT 1.8*  PROT 6.1*  ALBUMIN 2.2*   Radiological Exams on Admission: DG Chest 2 View  Result Date: 08/19/2021 CLINICAL DATA:  Altered mental status. EXAM: CHEST - 2 VIEW COMPARISON:  CT chest and chest x-ray dated June 25, 2021. FINDINGS: Unchanged cardiomegaly. New patchy consolidation in the left greater than right upper lobes. Increased small bilateral pleural effusions with bibasilar atelectasis. No pneumothorax. No acute osseous abnormality. Unchanged left subclavian and axillary stents. IMPRESSION: 1. New bilateral upper lobe pneumonia. 2. Increased small bilateral pleural effusions. Electronically Signed   By: Titus Dubin M.D.   On: 08/19/2021 12:53   CT Head Wo Contrast  Result Date: 08/19/2021 CLINICAL DATA:  Altered mental status, nontraumatic (Ped 0-17y) EXAM: CT HEAD WITHOUT CONTRAST TECHNIQUE: Contiguous axial images were obtained from the base of the skull through the vertex without intravenous contrast. COMPARISON:  04/21/2019. FINDINGS: Brain: No evidence of acute infarction, hemorrhage, hydrocephalus, extra-axial collection or mass lesion/mass effect. Progressive atrophy and ex vacuo ventricular dilation. Patchy white matter hypoattenuation, nonspecific but compatible with chronic microvascular ischemic disease. Vascular: No hyperdense vessel identified. Calcific intracranial atherosclerosis. Skull: No acute fracture. Sinuses/Orbits: Visualized sinuses are clear. Unremarkable visualized orbits. Other: Left mastoid effusion.  IMPRESSION: 1. No evidence of acute intracranial abnormality. 2. Since 2020, progressive atrophy and ex vacuo ventricular dilation. 3. Left mastoid effusion Electronically Signed   By: Margaretha Sheffield M.D.   On: 08/19/2021 14:48    EKG: Independently reviewed.  Vent. rate 67 BPM PR interval 175 ms QRS duration 91 ms QT/QTcB 442/467 ms P-R-T axes 106 89 171 Sinus rhythm Borderline right axis deviation Borderline repolarization abnormality  Assessment/Plan Principal Problem:   Sepsis due to pneumonia POA ([place]) Admit to PCU/inpatient. Continue supplemental oxygen. Continue albuterol inhaler. Continue ceftriaxone 2 g IVPB daily. Continue Zithromax 500 mg p.o. daily. Check sputum gram stain, culture and sensitivity. Check strep pneumoniae urinary antigen. Follow-up blood culture and sensitivity.  Active Problems:   Type 2 diabetes mellitus with  diabetic chronic kidney disease (HCC) Carbohydrate modified diet. CBG monitoring with RI SS. Continue gabapentin for peripheral neuropathy.    Hyperlipidemia Continue rosuvastatin 10 mg p.o. daily.    ESRD on dialysis ([place]) Fluid restriction. Continue on Sensipar 30 mg p.o. 3 times weekly. Consult nephrology for HD tomorrow AM.    Anemia in ESRD (end-stage renal disease) ([place]) Monitor hematocrit and hemoglobin. Erythropoietin per nephrology.    Essential hypertension Continue nebivolol 5 mg p.o. daily. Continue amlodipine 10 mg p.o. daily. Monitor BP and heart rate.    GERD (gastroesophageal reflux disease) Continue pantoprazole 40 mg p.o. daily.    Severe protein-calorie malnutrition (HCC) Protein supplementation. Consult nutritional services.    Hyperbilirubinemia Follow bilirubin level in AM.    DVT prophylaxis: Lovenox SQ. Code Status:   Full code. Family Communication:  Her sister was in the emergency department. Disposition Plan:   Patient is from:  Home.  Anticipated DC to:  TBD.  Anticipated DC  date:  08/22/2021.  Anticipated DC barriers: Clinical status. Consults called:   Admission status:  Observation/telemetry.   Severity of Illness: High severity in the setting of sepsis secondary to pneumonia.  Reubin Milan MD [facility]  How to contact the [facility] Attending or Consulting provider [place] or covering provider during after hours [place], for this patient?   Check the care team in [facility] and  look for a) attending/consulting TR[facility] provider listed and b) the [facility] team listed Log into [email or web address] and use [place]'s universal password to access. If you do not have the password, please contact the hospital operator. Locate the [facility] provider you are looking for under [facility] and page to a number that you can be directly reached. If you still have difficulty reaching the provider, please page the [facility] (Director on Call) for the Hospitalists listed on amion for assistance.  08/19/2021, 4:35 PM   This document was prepared using [employer] and may contain some unintended transcription errors.

## 2021-08-19 NOTE — ED Provider Notes (Signed)
Emergency Medicine Provider Triage Evaluation Note  Carolyn Valenzuela , a 70 y.o. female  was evaluated in triage.  Pt complains of weakness, altered mental status and failure to thrive. Patient is brought in by family member.  They complain that she was discharged from skilled nursing facility recently after 20 days of placement after hospitalization.  She is on dialysis Monday Wednesday and Friday with last treatment yesterday.  Her sister reports that she has extremely confused.  She has been getting more and more weak, unable to stand or ambulate, using the bathroom on herself all of which is new since she was discharged from [facility].  She also reports that the patient has a boil on her right stump and left buttock.  She states that she is markedly more confused than normal.  Feel that they are unable to care for her in her current state.  Review of Systems  Positive: Weakness, altered mental Negative: Fever chills or cough  Physical Exam  BP (!) 122/53 (BP Location: Right Arm)    Pulse 67    Temp 98.9 F (37.2 C) (Oral)    Resp 18    Ht 5' (1.524 m)    SpO2 93%    BMI 21.57 kg/m  Gen:   Awake, no distress   Resp:  Normal effort  MSK:   Moves extremities without difficulty  Other:  Confusion  Medical Decision Making  Medically screening exam initiated at 12:25 PM.  Appropriate orders placed.  TRINDA HARLACHER was informed that the remainder of the evaluation will be completed by another provider, this initial triage assessment does not replace that evaluation, and the importance of remaining in the ED until their evaluation is complete.  Work-up initiated   Margarita Mail, PA-C 08/19/21 1227    Lacretia Leigh, MD 08/20/21 [phone]

## 2021-08-19 NOTE — ED Notes (Signed)
In/Out cath was attempted x2 and unsuccessful. Provider and RN made aware .

## 2021-08-19 NOTE — ED Notes (Signed)
[name]:  Pt's sister   [phone]

## 2021-08-20 ENCOUNTER — Encounter: Payer: Self-pay | Admitting: Internal Medicine

## 2021-08-20 DIAGNOSIS — N186 End stage renal disease: Secondary | ICD-10-CM

## 2021-08-20 DIAGNOSIS — K219 Gastro-esophageal reflux disease without esophagitis: Secondary | ICD-10-CM

## 2021-08-20 DIAGNOSIS — D631 Anemia in chronic kidney disease: Secondary | ICD-10-CM

## 2021-08-20 DIAGNOSIS — L8990 Pressure ulcer of unspecified site, unspecified stage: Secondary | ICD-10-CM | POA: Insufficient documentation

## 2021-08-20 DIAGNOSIS — Z992 Dependence on renal dialysis: Secondary | ICD-10-CM

## 2021-08-20 DIAGNOSIS — I10 Essential (primary) hypertension: Secondary | ICD-10-CM

## 2021-08-20 DIAGNOSIS — R627 Adult failure to thrive: Secondary | ICD-10-CM | POA: Insufficient documentation

## 2021-08-20 LAB — CBC WITH DIFFERENTIAL/PLATELET
Abs Immature Granulocytes: 0.1 10*3/uL — ABNORMAL HIGH (ref 0.00–0.07)
Basophils Absolute: 0 10*3/uL (ref 0.0–0.1)
Basophils Relative: 0 %
Eosinophils Absolute: 0 10*3/uL (ref 0.0–0.5)
Eosinophils Relative: 0 %
HCT: 22.2 % — ABNORMAL LOW (ref 36.0–46.0)
Hemoglobin: 7.8 g/dL — ABNORMAL LOW (ref 12.0–15.0)
Immature Granulocytes: 1 %
Lymphocytes Relative: 13 %
Lymphs Abs: 1.3 10*3/uL (ref 0.7–4.0)
MCH: 31.3 pg (ref 26.0–34.0)
MCHC: 35.1 g/dL (ref 30.0–36.0)
MCV: 89.2 fL (ref 80.0–100.0)
Monocytes Absolute: 0.7 10*3/uL (ref 0.1–1.0)
Monocytes Relative: 7 %
Neutro Abs: 7.3 10*3/uL (ref 1.7–7.7)
Neutrophils Relative %: 79 %
Platelets: 117 10*3/uL — ABNORMAL LOW (ref 150–400)
RBC: 2.49 MIL/uL — ABNORMAL LOW (ref 3.87–5.11)
RDW: 17.6 % — ABNORMAL HIGH (ref 11.5–15.5)
WBC: 9.3 10*3/uL (ref 4.0–10.5)
nRBC: 0 % (ref 0.0–0.2)

## 2021-08-20 LAB — BLOOD GAS, ARTERIAL
Acid-Base Excess: 4.1 mmol/L — ABNORMAL HIGH (ref 0.0–2.0)
Bicarbonate: 27.7 mmol/L (ref 20.0–28.0)
FIO2: 21
O2 Saturation: 90.2 %
Patient temperature: 37
pCO2 arterial: 39 mmHg (ref 32.0–48.0)
pH, Arterial: 7.465 — ABNORMAL HIGH (ref 7.350–7.450)
pO2, Arterial: 57.8 mmHg — ABNORMAL LOW (ref 83.0–108.0)

## 2021-08-20 LAB — COMPREHENSIVE METABOLIC PANEL
ALT: 12 U/L (ref 0–44)
AST: 16 U/L (ref 15–41)
Albumin: <1.5 g/dL — ABNORMAL LOW (ref 3.5–5.0)
Alkaline Phosphatase: 57 U/L (ref 38–126)
Anion gap: 12 (ref 5–15)
BUN: 12 mg/dL (ref 8–23)
CO2: 19 mmol/L — ABNORMAL LOW (ref 22–32)
Calcium: 7.3 mg/dL — ABNORMAL LOW (ref 8.9–10.3)
Chloride: 100 mmol/L (ref 98–111)
Creatinine, Ser: 2.56 mg/dL — ABNORMAL HIGH (ref 0.44–1.00)
GFR, Estimated: 20 mL/min — ABNORMAL LOW (ref >60)
Glucose, Bld: 147 mg/dL — ABNORMAL HIGH (ref 70–99)
Potassium: 3.6 mmol/L (ref 3.5–5.1)
Sodium: 131 mmol/L — ABNORMAL LOW (ref 135–145)
Total Bilirubin: 0.8 mg/dL (ref 0.3–1.2)
Total Protein: 3.7 g/dL — ABNORMAL LOW (ref 6.5–8.1)

## 2021-08-20 LAB — PROTIME-INR
INR: 2.1 — ABNORMAL HIGH (ref 0.8–1.2)
Prothrombin Time: 23.2 seconds — ABNORMAL HIGH (ref 11.4–15.2)

## 2021-08-20 LAB — LACTIC ACID, PLASMA
Lactic Acid, Venous: 7.5 mmol/L (ref 0.5–1.9)
Lactic Acid, Venous: 3.3 mmol/L (ref 0.5–1.9)
Lactic Acid, Venous: 3.1 mmol/L (ref 0.5–1.9)

## 2021-08-20 LAB — HEPATITIS B SURFACE ANTIBODY,QUALITATIVE: Hep B S Ab: REACTIVE — AB

## 2021-08-20 LAB — HEMOGLOBIN AND HEMATOCRIT, BLOOD
HCT: 27 % — ABNORMAL LOW (ref 36.0–46.0)
Hemoglobin: 9.6 g/dL — ABNORMAL LOW (ref 12.0–15.0)

## 2021-08-20 LAB — GLUCOSE, CAPILLARY
Glucose-Capillary: 123 mg/dL — ABNORMAL HIGH (ref 70–99)
Glucose-Capillary: 145 mg/dL — ABNORMAL HIGH (ref 70–99)
Glucose-Capillary: 148 mg/dL — ABNORMAL HIGH (ref 70–99)
Glucose-Capillary: 97 mg/dL (ref 70–99)

## 2021-08-20 LAB — APTT: aPTT: 36 seconds (ref 24–36)

## 2021-08-20 LAB — HEPATITIS B SURFACE ANTIGEN: Hepatitis B Surface Ag: NONREACTIVE

## 2021-08-20 MED ORDER — LIDOCAINE-PRILOCAINE 2.5-2.5 % EX CREA: 1.0000 "application " | TOPICAL_CREAM | CUTANEOUS | Status: DC | PRN

## 2021-08-20 MED ORDER — LIDOCAINE HCL (PF) 1 % IJ SOLN: 5.0000 mL | INTRAMUSCULAR | Status: DC | PRN

## 2021-08-20 MED ORDER — PENTAFLUOROPROP-TETRAFLUOROETH EX AERO: 1.0000 "application " | INHALATION_SPRAY | CUTANEOUS | Status: DC | PRN

## 2021-08-20 MED ORDER — SODIUM CHLORIDE 0.9 % IV SOLN: 100.0000 mL | INTRAVENOUS | Status: DC | PRN

## 2021-08-20 MED ORDER — HEPARIN SODIUM (PORCINE) 1000 UNIT/ML DIALYSIS: 1000.0000 [IU] | INTRAMUSCULAR | Status: DC | PRN

## 2021-08-20 MED ORDER — ALBUMIN HUMAN 25 % IV SOLN
25.0000 g | Freq: Once | INTRAVENOUS | Status: AC
  Administered 2021-08-20: 25 g via INTRAVENOUS

## 2021-08-20 MED ORDER — ALTEPLASE 2 MG IJ SOLR: 2.0000 mg | Freq: Once | INTRAMUSCULAR | Status: DC | PRN

## 2021-08-20 MED ORDER — ALBUMIN HUMAN 25 % IV SOLN
INTRAVENOUS | Status: AC
  Filled 2021-08-20: qty 100

## 2021-08-20 MED ORDER — CHLORHEXIDINE GLUCONATE CLOTH 2 % EX PADS
6.0000 | MEDICATED_PAD | Freq: Every day | CUTANEOUS | Status: DC
  Administered 2021-08-20: 16:00:00 6 via TOPICAL

## 2021-08-20 NOTE — Progress Notes (Addendum)
TRH night coverage.  S: 70 yo F with ESRD, B BKAs, admitted for sepsis from PNA.  Pt seen and evaluated at bedside after repeat lactate came back at 7.5 up from 3.2 earlier.   O: Pt with RR of 14, satting 95-100%, unlabored breathing.  BP 110/53.  Lung sounds with decent air movement, minimal cough.  Abd NT, ND.  Has a stage 2 sacral decubitus.  Has minimal break down at sites of BLE amputations.  But nothing that explains a lactate of 7.5.  A: 70 yo F with ESRD, admitted for sepsis from PNA.  Not sure where the lactate is coming from, or if this lactate of 7.5 is even real (vs a lab error).  Pt looks anything other than what Id expect from a patient with a lactate of 7.5: Pt is AAOx2.  Denies: abd pain, SOB, CP, leg pain, pain anywhere other than in her arm where RT is currently in the process of sticking her for an ABG.  P: 1) checking ABG now 2) repeat lactate 3) will leave the LR at 150 cc/hr alone for the moment pending repeat lactate results.  Update: 1) ABG: pH 7.465.  Minimal alkalosis, but doubt this causing the lactic acidosis. 2) anion gap on BMP drawn same time as lactate = 12, up from 11 earlier in the evening.  Not the severe anion gap acidosis id expect from a lactate of 7.5.  Update: Lactate has come back at 3.3.  This is much more consistent with how the patient looks clinically.  Think the 7.5 was an error.

## 2021-08-20 NOTE — Progress Notes (Signed)
PROGRESS NOTE  Carolyn Valenzuela  SNK:[id number] DOB: [birth year] DOA: 08/19/2021 PCP: Biagio Borg, MD   Brief Narrative: Carolyn Valenzuela is a 70 y.o. female with medical history significant of seasonal allergies, iron deficiency anemia, arctic regurgitation, CAD, left cervical radiculopathy, history of critical lower limb ischemia, bilateral BKA, uncontrolled type II DM, esophagitis, ESRD on HD, history of HCAP, hyperlipidemia, hypertension, ischemic cardiomyopathy, mitral regurgitation, peripheral vascular disease, secondary hyperparathyroidism who is sent by her PCP for evaluation and admission to a nursing home.  She also has a wound on her left stump.  She has had decreased appetite and weight loss.  Her generalized weakness has become worse.  She has been home for a few weeks after being discharged to [facility] rehab where she spent about 20 days after her last admission in October.  She is able to answer simple questions denied headache, chest pain, back or abdominal pain at this time.   ED Course: Initial vital signs were temperature 98.9 F, pulse 67, respiration 18, BP 122/53 mmHg O2 sat 93% on room air.  The patient was given a 500 mL NS bolus, ceftriaxone and Zithromax in the emergency department.   Lab work: Her CBC showed a white count of 17.8, hemoglobin 11.2 g/dL platelets 173.  Lactic acid was 3.2 mmol/L.  Ammonia was 25 mol/L.  Coronavirus and influenza PCR was negative.  CMP showed a sodium 130 and chloride of 95 mmol/L.  The rest of the electrolytes were normal when calcium corrected to albumin.  Glucose 299, creatinine 3.12 and total bilirubin 1.8 mg/dL.  Total protein 6.1 and albumin 2.2 g/dL.  Transaminases were normal.   Imaging: 2 view chest radiograph showed new bilateral upper lobe pneumonia with increased small bilateral pleural effusion.  CT head without contrast did not show any evidence of acute intracranial normality.  However since 2020 there has been progressive  atrophy and ex vacuo ventricular dilation.  There was left mastoid effusion.  Please see images and full radiology report for further details.   Assessment & Plan: Principal Problem:   Sepsis due to pneumonia [facility]) Active Problems:   Type 2 diabetes mellitus with diabetic chronic kidney disease ([place])   Hyperlipidemia   Essential hypertension   ESRD on dialysis ([place])   GERD (gastroesophageal reflux disease)   Severe protein-calorie malnutrition ([place])   Anemia in ESRD (end-stage renal disease) ([place])   Hyperbilirubinemia   Pressure injury of skin  Sepsis with lactic acidosis due to bilateral pneumonia:  - Continue empiric ceftriaxone, azithromycin. WBC normalized. Lactate elevation exacerbated by spurious lab result, suspect truly in 3's and not likely to clear until HD.   HTN: Hold antihypertensives currently  ESRD:  - Nephrology consulted for routine HD. Pressures soft despite IVF, consider midodrine.   Anemia of ESRD: Recently received ESA. Drop in hgb since admission in setting of large volume IVF resuscitation and drop in all cell lines. No gross bleeding noted.  - FOBT - Check H/H this PM to confirm stability, consider transfusion.   T2DM with peripheral neuropathy:  - Continue SSI, gabapentin  Failure to thrive: Note 20 lbs weight loss and still has swelling in LE's, so suspect muscle mass loss. Debility has progressed to limiting her ability to remain safely at home despite 24 hour assistance with family.  - PT/OT consulted - TOC consulted - Palliative care consulted  Severe protein-calorie malnutrition:  - Supplement protein as able. Albumin undetectably low (<1.5).   DVT prophylaxis: Lovenox Code Status:  Full Family Communication: None at bedside, unable to reach by phone Disposition Plan:  Status is: Inpatient  Remains inpatient appropriate because: Unsafe DC.   Consultants:  Nephrology Palliative care  Procedures:  HD (routine)  12/21  Antimicrobials: Ceftriaxone, azithromycin   Subjective: Carolyn Valenzuela she came in because of weakness and leg pain that is currently better. No injury, focal pain, or fever. Has some shortness of breath at rest which is new, stable since admission. No reports of bleeding.   Objective: Vitals:   08/20/21 0417 08/20/21 0448 08/20/21 0730 08/20/21 0945  BP: (!) 104/50 (!) 109/53 (!) 108/47   Pulse: 65 65 60   Resp: 13 12 14    Temp:  (!) 97.4 F (36.3 C) 98.9 F (37.2 C)   TempSrc:  Oral Axillary Axillary  SpO2: 91% 100% 91%   Weight:      Height:        Intake/Output Summary (Last 24 hours) at 08/20/2021 0954 Last data filed at 08/19/2021 1726 Gross per 24 hour  Intake 839 ml  Output --  Net 839 ml   Filed Weights   08/19/21 1155 08/20/21 0039  Weight: 49.9 kg 50.8 kg    Gen: Chronically very ill-appearing female in no acute distress Pulm: Non-labored breathing supplemental oxygen, crackles at bases laterally (weakness limits upright exam)  CV: Regular rate and rhythm. No murmur, rub, or gallop. No JVD, [place]y diffuse pitting edema to thighs/buttocks bilaterally, symmetrically.  GI: Abdomen soft, non-tender, non-distended, with normoactive bowel sounds. No organomegaly or masses felt. Ext: Warm, dry, bilateral BKAs without exudate/erythema. Edema at legs is without fluctuance, focal erythema or point tenderness (diffusely uncomfortable) Skin: No rashes, lesions or ulcers on visualized skin.  Neuro: Alert and oriented. No focal neurological deficits. Psych: Judgement and insight appear normal. Mood & affect appropriate.   Data Reviewed: I have personally reviewed following labs and imaging studies  CBC: Recent Labs  Lab 08/19/21 1258 08/20/21 0126  WBC 17.8* 9.3  NEUTROABS 14.7* 7.3  HGB 11.2* 7.8*  HCT 33.5* 22.2*  MCV 91.0 89.2  PLT 173 426*   Basic Metabolic Panel: Recent Labs  Lab 08/19/21 1258 08/20/21 0126  NA 130* 131*  K 3.6 3.6  CL 95* 100  CO2 24  19*  GLUCOSE 299* 147*  BUN 13 12  CREATININE 3.12* 2.56*  CALCIUM 8.1* 7.3*   GFR: Estimated Creatinine Clearance: 14.9 mL/min (A) (by C-G formula based on SCr of 2.56 mg/dL (H)). Liver Function Tests: Recent Labs  Lab 08/19/21 1258 08/20/21 0126  AST 24 16  ALT 15 12  ALKPHOS 92 57  BILITOT 1.8* 0.8  PROT 6.1* 3.7*  ALBUMIN 2.2* <1.5*   No results for input(s): LIPASE, AMYLASE in the last 168 hours. Recent Labs  Lab 08/19/21 1259  AMMONIA 25   Coagulation Profile: Recent Labs  Lab 08/20/21 0126  INR 2.1*   Cardiac Enzymes: No results for input(s): CKTOTAL, CKMB, CKMBINDEX, TROPONINI in the last 168 hours. BNP (last 3 results) No results for input(s): PROBNP in the last 8760 hours. HbA1C: No results for input(s): HGBA1C in the last 72 hours. CBG: Recent Labs  Lab 08/19/21 1320 08/19/21 1853 08/20/21 0045 08/20/21 0732  GLUCAP 292* 263* 148* 145*   Lipid Profile: No results for input(s): CHOL, HDL, LDLCALC, TRIG, CHOLHDL, LDLDIRECT in the last 72 hours. Thyroid Function Tests: No results for input(s): TSH, T4TOTAL, FREET4, T3FREE, THYROIDAB in the last 72 hours. Anemia Panel: No results for input(s): VITAMINB12, FOLATE, FERRITIN, TIBC,  IRON, RETICCTPCT in the last 72 hours. Urine analysis:    Component Value Date/Time   COLORURINE YELLOW 09/15/2016 1300   APPEARANCEUR CLEAR 09/15/2016 1300   LABSPEC 1.010 09/15/2016 1300   PHURINE 7.5 09/15/2016 1300   GLUCOSEU 250 (A) 09/15/2016 1300   HGBUR SMALL (A) 09/15/2016 1300   BILIRUBINUR NEGATIVE 09/15/2016 1300   KETONESUR NEGATIVE 09/15/2016 1300   PROTEINUR >300 (A) 03/19/2016 0206   UROBILINOGEN 0.2 09/15/2016 1300   NITRITE NEGATIVE 09/15/2016 1300   LEUKOCYTESUR NEGATIVE 09/15/2016 1300   Recent Results (from the past 240 hour(s))  Resp Panel by RT-PCR (Flu A&B, Covid) Nasopharyngeal Swab     Status: None   Collection Time: 08/19/21  2:00 PM   Specimen: Nasopharyngeal Swab; Nasopharyngeal(NP)  swabs in vial transport medium  Result Value Ref Range Status   SARS Coronavirus 2 by RT PCR NEGATIVE NEGATIVE Final    Comment: (NOTE) SARS-CoV-2 target nucleic acids are NOT DETECTED.  The SARS-CoV-2 RNA is generally detectable in upper respiratory specimens during the acute phase of infection. The lowest concentration of SARS-CoV-2 viral copies this assay can detect is 138 copies/mL. A negative result does not preclude SARS-Cov-2 infection and should not be used as the sole basis for treatment or other patient management decisions. A negative result may occur with  improper specimen collection/handling, submission of specimen other than nasopharyngeal swab, presence of viral mutation(s) within the areas targeted by this assay, and inadequate number of viral copies(<138 copies/mL). A negative result must be combined with clinical observations, patient history, and epidemiological information. The expected result is Negative.  Fact Sheet for Patients:  EntrepreneurPulse.com.au  Fact Sheet for Healthcare Providers:  IncredibleEmployment.be  This test is no t yet approved or cleared by the [place] FDA and  has been authorized for detection and/or diagnosis of SARS-CoV-2 by FDA under an Emergency Use Authorization (EUA). This EUA will remain  in effect (meaning this test can be used) for the duration of the COVID-19 declaration under Section 564(b)(1) of the Act, 21 U.S.C.section 360bbb-3(b)(1), unless the authorization is terminated  or revoked sooner.       Influenza A by PCR NEGATIVE NEGATIVE Final   Influenza B by PCR NEGATIVE NEGATIVE Final    Comment: (NOTE) The Xpert Xpress SARS-CoV-2/FLU/RSV plus assay is intended as an aid in the diagnosis of influenza from Nasopharyngeal swab specimens and should not be used as a sole basis for treatment. Nasal washings and aspirates are unacceptable for Xpert Xpress  SARS-CoV-2/FLU/RSV testing.  Fact Sheet for Patients: EntrepreneurPulse.com.au  Fact Sheet for Healthcare Providers: IncredibleEmployment.be  This test is not yet approved or cleared by the [place] FDA and has been authorized for detection and/or diagnosis of SARS-CoV-2 by FDA under an Emergency Use Authorization (EUA). This EUA will remain in effect (meaning this test can be used) for the duration of the COVID-19 declaration under Section 564(b)(1) of the Act, 21 U.S.C. section 360bbb-3(b)(1), unless the authorization is terminated or revoked.  Performed at [facility], [place] [address], [place], [place] [address]       Radiology Studies: DG Chest 2 View  Result Date: 08/19/2021 CLINICAL DATA:  Altered mental status. EXAM: CHEST - 2 VIEW COMPARISON:  CT chest and chest x-ray dated June 25, 2021. FINDINGS: Unchanged cardiomegaly. New patchy consolidation in the left greater than right upper lobes. Increased small bilateral pleural effusions with bibasilar atelectasis. No pneumothorax. No acute osseous abnormality. Unchanged left subclavian and axillary stents. IMPRESSION: 1. New  bilateral upper lobe pneumonia. 2. Increased small bilateral pleural effusions. Electronically Signed   By: Titus Dubin M.D.   On: 08/19/2021 12:53   CT Head Wo Contrast  Result Date: 08/19/2021 CLINICAL DATA:  Altered mental status, nontraumatic (Ped 0-17y) EXAM: CT HEAD WITHOUT CONTRAST TECHNIQUE: Contiguous axial images were obtained from the base of the skull through the vertex without intravenous contrast. COMPARISON:  04/21/2019. FINDINGS: Brain: No evidence of acute infarction, hemorrhage, hydrocephalus, extra-axial collection or mass lesion/mass effect. Progressive atrophy and ex vacuo ventricular dilation. Patchy white matter hypoattenuation, nonspecific but compatible with chronic microvascular ischemic disease. Vascular: No hyperdense  vessel identified. Calcific intracranial atherosclerosis. Skull: No acute fracture. Sinuses/Orbits: Visualized sinuses are clear. Unremarkable visualized orbits. Other: Left mastoid effusion. IMPRESSION: 1. No evidence of acute intracranial abnormality. 2. Since 2020, progressive atrophy and ex vacuo ventricular dilation. 3. Left mastoid effusion Electronically Signed   By: Margaretha Sheffield M.D.   On: 08/19/2021 14:48    Scheduled Meds:  aspirin EC  81 mg Oral Daily   brimonidine  1 drop Both Eyes TID   Chlorhexidine Gluconate Cloth  6 each Topical Q0600   cinacalcet  30 mg Oral Q M,W,F-1800   clopidogrel  75 mg Oral Daily   enoxaparin (LOVENOX) injection  30 mg Subcutaneous Q24H   gabapentin  200 mg Oral TID   insulin aspart  0-6 Units Subcutaneous TID WC   pantoprazole  40 mg Oral Daily   rosuvastatin  10 mg Oral Daily   Continuous Infusions:  sodium chloride     sodium chloride     azithromycin 500 mg (08/20/21 0850)   cefTRIAXone (ROCEPHIN)  IV       LOS: 1 day   Time spent: 25 minutes.  Patrecia Pour, MD [facility] [email or web address] 08/20/2021, 9:54 AM

## 2021-08-20 NOTE — Assessment & Plan Note (Signed)
Difficult historian, now with worsening ability to care for herself and beyond family as well, may have underlying ill defined acute illness I suspect, so I asked family to take her to ED for further consdieration for possible admit for placement and any acute illness tx

## 2021-08-20 NOTE — Assessment & Plan Note (Signed)
Now low normal, cant r/o sepsis, for ED as above

## 2021-08-20 NOTE — Assessment & Plan Note (Signed)
With recent worsening, likely will need PT

## 2021-08-20 NOTE — Consult Note (Signed)
[facility] Renal Consultation Note    Indication for Consultation:  Management of ESRD/hemodialysis; anemia, hypertension/volume and secondary hyperparathyroidism  HPI: Carolyn Valenzuela is a 70 y.o. female  with past medical history including ESRD on dialysis Monday Wednesday Friday at [facility], HTN, type 2 diabetes, HLD, ischemic CMP, SHPTH, anemia of CKD stage V, PAD status post bilateral BKA's, hypertension, and CAD who was sent to [facility] ED by her PCP due to generalized weakness, anorexia, and weight loss.  In the ED she was afebrile and VSS.  Labs were notable for WBC 17.8, respiratory panel negative but CXR revealed new bilateral upper lobe pneumonia with increased bilateral pleural effusions.  She was admitted for treatment of her PNA and for placement into SNF due to FTT.  Overnight, her lactic acid increased from 3.2 to 7.5.  We were consulted to provide HD during her hospitalization.  Past Medical History:  Diagnosis Date   Allergic rhinitis, cause unspecified 12/08/2010   ANEMIA-IRON DEFICIENCY 01/25/2008   Aortic regurgitation    CAD (coronary artery disease)    a. 07/2016 NSTEMI/PCI: LM nl, LAD 70ost, 6m/d, 80d, D1 40ost, LCX 99ost/p (2.5x16 Synergy DES - 2.75), 40/87m, Om2 100 CTO, RCA  30p/m. Pt eval by CT surgery, not felt to be suitable candidate 2/2 comorbidities.   CERVICAL RADICULOPATHY, LEFT 2/56/3893   Chronic systolic CHF (congestive heart failure) ([place])    a. 07/2016 Echo: EF 40-45%, diff HK, sev AI, mild MVP w/ mod to sev MR, PASP 69mmHg.   Critical lower limb ischemia ([place])    DIABETES MELLITUS, UNCONTROLLED 05/21/2009   Esophagitis    ESRD on dialysis [facility])    Foot ulcer ([place])    right lateral malleolus   HCAP (healthcare-associated pneumonia) 06/29/2016   HYPERLIPIDEMIA 03/30/2007   HYPERTENSION 01/25/2008   Ischemic cardiomyopathy    a. 07/2016 Echo: EF 40-45%.   Mitral regurgitation    a. 07/2016 Echo: Mild MVP w/ mod to sev MR.   PVD (peripheral  vascular disease) ([place])    a. s/p bilat BKA   SECONDARY HYPERPARATHYROIDISM 05/21/2009   Severe aortic regurgitation    a. 07/2016 Echo: Severe AI.   Past Surgical History:  Procedure Laterality Date   AMPUTATION Right 03/15/2014   Procedure: RIGHT  LEG  BELOW KNEE AMPUTATION ;  Surgeon: [name], MD;  Location: [place];  Service: Orthopedics;  Laterality: Right;   AMPUTATION Left 11/29/2015   Procedure: AMPUTATION BELOW KNEE;  Surgeon: Newt Minion, MD;  Location: [place];  Service: Orthopedics;  Laterality: Left;   AV FISTULA PLACEMENT Left    AV FISTULA PLACEMENT Left 09/06/2014   Procedure: ARTERIOVENOUS (AV) FISTULA CREATION-Left Brachiocephalic;  Surgeon: Conrad Cottonwood, MD;  Location: [place];  Service: Vascular;  Laterality: Left;   CARDIAC CATHETERIZATION N/A 06/30/2016   Procedure: Left Heart Cath and Coronary Angiography;  Surgeon: Burnell Blanks, MD;  Location: [place] CV LAB;  Service: Cardiovascular;  Laterality: N/A;   CARDIAC CATHETERIZATION N/A 07/02/2016   Procedure: Coronary Stent Intervention;  Surgeon: Nelva Bush, MD;  Location: [place] CV LAB;  Service: Cardiovascular;  Laterality: N/A;   CATARACT EXTRACTION     COLONOSCOPY     CORONARY ANGIOPLASTY WITH STENT PLACEMENT  07/02/2016   ESOPHAGOGASTRODUODENOSCOPY N/A 03/20/2016   Procedure: ESOPHAGOGASTRODUODENOSCOPY (EGD);  Surgeon: Milus Banister, MD;  Location: [place];  Service: Endoscopy;  Laterality: N/A;   EYE SURGERY Bilateral    cataracts removed, left eye still has some oil  in it.   INSERTION OF DIALYSIS CATHETER N/A 09/03/2014   Procedure: INSERTION OF DIALYSIS CATHETER RIGHT INTERNAL JUGULAR VEIN;  Surgeon: Conrad , MD;  Location: [place];  Service: Vascular;  Laterality: N/A;   PERIPHERAL VASCULAR CATHETERIZATION N/A 08/13/2015   Procedure: Abdominal Aortogram;  Surgeon: Elam Dutch, MD;  Location: [place] CV LAB;  Service: Cardiovascular;  Laterality: N/A;   Family History:    Family History  Problem Relation Age of Onset   Cancer Mother        Pancreatic   Social History:  reports that she has never smoked. She has never used smokeless tobacco. She reports that she does not drink alcohol and does not use drugs. Allergies  Allergen Reactions   Oxycodone Itching   Prior to Admission medications   Medication Sig Start Date End Date Taking? Authorizing Provider  rosuvastatin (CRESTOR) 40 MG tablet Take 40 mg by mouth daily.   Yes [provider]  acetaminophen (TYLENOL) 500 MG tablet Take 500 mg by mouth every 6 (six) hours as needed for mild pain.    [provider]  albuterol (VENTOLIN HFA) 108 (90 Base) MCG/ACT inhaler Inhale 2 puffs into the lungs every 6 (six) hours as needed for wheezing or shortness of breath. 02/13/21   Biagio Borg, MD  ALPHAGAN P 0.1 % SOLN Place 1 drop into both eyes 3 (three) times daily. 11/18/20   [provider]  amLODipine (NORVASC) 10 MG tablet TAKE 1 TABLET BY MOUTH EVERYDAY AT BEDTIME 08/05/21   Biagio Borg, MD  aspirin 81 MG EC tablet Take 81 mg by mouth daily.    [provider]  cinacalcet (SENSIPAR) 30 MG tablet Take 1 tablet (30 mg total) by mouth every Monday, Wednesday, and Friday at 6 PM. 06/30/21   Mercy Riding, MD  clopidogrel (PLAVIX) 75 MG tablet Take 1 tablet (75 mg total) by mouth daily. 01/14/21   Almyra Deforest, PA  gabapentin (NEURONTIN) 100 MG capsule TAKE 2 CAPSULES BY MOUTH 3 TIMES A DAY 08/16/21   Biagio Borg, MD  lidocaine-prilocaine (EMLA) cream SMARTSIG:Sparingly Topical 3 Times a Week 06/27/21   [provider]  loperamide (IMODIUM) 2 MG capsule Take 1 capsule (2 mg total) by mouth every 4 (four) hours as needed for diarrhea or loose stools. 06/30/21   Mercy Riding, MD  Methoxy PEG-Epoetin Beta (MIRCERA IJ) Mircera Patient not taking: Reported on 08/19/2021 07/29/20 12/08/21  [provider]  multivitamin (RENA-VIT) TABS tablet TAKE 1 TABLET BY MOUTH  EVERYDAY AT BEDTIME 08/05/21   Biagio Borg, MD  nebivolol (BYSTOLIC) 5 MG tablet Take 1 tablet (5 mg total) by mouth daily. 09/12/20   Lorretta Harp, MD  ondansetron (ZOFRAN) 4 MG tablet Take 1 tablet (4 mg total) by mouth every 8 (eight) hours as needed for nausea. Patient not taking: Reported on 08/19/2021 06/30/21   Mercy Riding, MD  pantoprazole (PROTONIX) 40 MG tablet TAKE 1 TABLET BY MOUTH TWICE A DAY Patient taking differently: Take 40 mg by mouth daily. 03/24/21   Biagio Borg, MD  psyllium (HYDROCIL/METAMUCIL) 95 % PACK Take 1 packet by mouth daily. Patient not taking: Reported on 08/19/2021 07/01/21   Mercy Riding, MD  RESTASIS 0.05 % ophthalmic emulsion Place 1 drop into both eyes 2 (two) times daily. 08/29/20   [provider]  VELPHORO 500 MG chewable tablet Chew 500 mg by mouth 2 (two) times daily. Patient not taking:  Reported on 08/19/2021 03/04/20   [provider]   Current Facility-Administered Medications  Medication Dose Route Frequency Provider Last Rate Last Admin   0.9 %  sodium chloride infusion  100 mL Intravenous PRN Tobie Poet E, NP       0.9 %  sodium chloride infusion  100 mL Intravenous PRN Adelfa Koh, NP       acetaminophen (TYLENOL) tablet 650 mg  650 mg Oral Q6H PRN Reubin Milan, MD       Or   acetaminophen (TYLENOL) suppository 650 mg  650 mg Rectal Q6H PRN Reubin Milan, MD       albuterol (PROVENTIL) (2.5 MG/3ML) 0.083% nebulizer solution 3 mL  3 mL Inhalation Q6H PRN Reubin Milan, MD       alteplase (CATHFLO ACTIVASE) injection 2 mg  2 mg Intracatheter Once PRN Adelfa Koh, NP       aspirin EC tablet 81 mg  81 mg Oral Daily Reubin Milan, MD   81 mg at 08/20/21 0844   azithromycin (ZITHROMAX) 500 mg in sodium chloride 0.9 % 250 mL IVPB  500 mg Intravenous Q24H Reubin Milan, MD 250 mL/hr at 08/20/21 0850 500 mg at 08/20/21 0850   brimonidine (ALPHAGAN) 0.2 % ophthalmic solution  1 drop  1 drop Both Eyes TID Reubin Milan, MD   1 drop at 08/20/21 0844   cefTRIAXone (ROCEPHIN) 2 g in sodium chloride 0.9 % 100 mL IVPB  2 g Intravenous Q24H Reubin Milan, MD       Chlorhexidine Gluconate Cloth 2 % PADS 6 each  6 each Topical Q0600 Adelfa Koh, NP       cinacalcet [facility]) tablet 30 mg  30 mg Oral Q M,W,F-1800 Reubin Milan, MD       clopidogrel (PLAVIX) tablet 75 mg  75 mg Oral Daily Reubin Milan, MD   75 mg at 08/20/21 0845   enoxaparin (LOVENOX) injection 30 mg  30 mg Subcutaneous Q24H Reubin Milan, MD   30 mg at 08/20/21 0111   gabapentin (NEURONTIN) capsule 200 mg  200 mg Oral TID Reubin Milan, MD       heparin injection 1,000 Units  1,000 Units Dialysis PRN Adelfa Koh, NP       insulin aspart (novoLOG) injection 0-6 Units  0-6 Units Subcutaneous TID WC Reubin Milan, MD   3 Units at 08/19/21 1950   lidocaine (PF) (XYLOCAINE) 1 % injection 5 mL  5 mL Intradermal PRN Adelfa Koh, NP       lidocaine-prilocaine (EMLA) cream 1 application  1 application Topical PRN Adelfa Koh, NP       ondansetron [facility]) tablet 4 mg  4 mg Oral Q6H PRN Reubin Milan, MD       Or   ondansetron [facility]) injection 4 mg  4 mg Intravenous Q6H PRN Reubin Milan, MD       pantoprazole (PROTONIX) EC tablet 40 mg  40 mg Oral Daily Reubin Milan, MD   40 mg at 08/20/21 0272   pentafluoroprop-tetrafluoroeth (GEBAUERS) aerosol 1 application  1 application Topical PRN Adelfa Koh, NP       rosuvastatin (CRESTOR) tablet 10 mg  10 mg Oral Daily Reubin Milan, MD       Labs: Basic Metabolic Panel: Recent Labs  Lab 08/19/21 1258 08/20/21 0126  NA 130* 131*  K 3.6 3.6  CL 95*  100  CO2 24 19*  GLUCOSE 299* 147*  BUN 13 12  CREATININE 3.12* 2.56*  CALCIUM 8.1* 7.3*   Liver Function Tests: Recent Labs  Lab 08/19/21 1258 08/20/21 0126  AST 24 16  ALT 15 12  ALKPHOS 92 57   BILITOT 1.8* 0.8  PROT 6.1* 3.7*  ALBUMIN 2.2* <1.5*   No results for input(s): LIPASE, AMYLASE in the last 168 hours. Recent Labs  Lab 08/19/21 1259  AMMONIA 25   CBC: Recent Labs  Lab 08/19/21 1258 08/20/21 0126  WBC 17.8* 9.3  NEUTROABS 14.7* 7.3  HGB 11.2* 7.8*  HCT 33.5* 22.2*  MCV 91.0 89.2  PLT 173 117*   Cardiac Enzymes: No results for input(s): CKTOTAL, CKMB, CKMBINDEX, TROPONINI in the last 168 hours. CBG: Recent Labs  Lab 08/19/21 1320 08/19/21 1853 08/20/21 0045 08/20/21 0732  GLUCAP 292* 263* 148* 145*   Iron Studies: No results for input(s): IRON, TIBC, TRANSFERRIN, FERRITIN in the last 72 hours. Studies/Results: DG Chest 2 View  Result Date: 08/19/2021 CLINICAL DATA:  Altered mental status. EXAM: CHEST - 2 VIEW COMPARISON:  CT chest and chest x-ray dated June 25, 2021. FINDINGS: Unchanged cardiomegaly. New patchy consolidation in the left greater than right upper lobes. Increased small bilateral pleural effusions with bibasilar atelectasis. No pneumothorax. No acute osseous abnormality. Unchanged left subclavian and axillary stents. IMPRESSION: 1. New bilateral upper lobe pneumonia. 2. Increased small bilateral pleural effusions. Electronically Signed   By: Titus Dubin M.D.   On: 08/19/2021 12:53   CT Head Wo Contrast  Result Date: 08/19/2021 CLINICAL DATA:  Altered mental status, nontraumatic (Ped 0-17y) EXAM: CT HEAD WITHOUT CONTRAST TECHNIQUE: Contiguous axial images were obtained from the base of the skull through the vertex without intravenous contrast. COMPARISON:  04/21/2019. FINDINGS: Brain: No evidence of acute infarction, hemorrhage, hydrocephalus, extra-axial collection or mass lesion/mass effect. Progressive atrophy and ex vacuo ventricular dilation. Patchy white matter hypoattenuation, nonspecific but compatible with chronic microvascular ischemic disease. Vascular: No hyperdense vessel identified. Calcific intracranial atherosclerosis.  Skull: No acute fracture. Sinuses/Orbits: Visualized sinuses are clear. Unremarkable visualized orbits. Other: Left mastoid effusion. IMPRESSION: 1. No evidence of acute intracranial abnormality. 2. Since 2020, progressive atrophy and ex vacuo ventricular dilation. 3. Left mastoid effusion Electronically Signed   By: Margaretha Sheffield M.D.   On: 08/19/2021 14:48    ROS: Pertinent items are noted in HPI. Physical Exam: Vitals:   08/20/21 0200 08/20/21 0417 08/20/21 0448 08/20/21 0730  BP: (!) 110/53 (!) 104/50 (!) 109/53 (!) 108/47  Pulse: 79 65 65 60  Resp: 17 13 12 14   Temp:   (!) 97.4 F (36.3 C) 98.9 F (37.2 C)  TempSrc:   Oral Axillary  SpO2: 95% 91% 100% 91%  Weight:      Height:          Weight change:   Intake/Output Summary (Last 24 hours) at 08/20/2021 8250 Last data filed at 08/19/2021 1726 Gross per 24 hour  Intake 839 ml  Output --  Net 839 ml   BP (!) 108/47 (BP Location: Right Arm)    Pulse 60    Temp 98.9 F (37.2 C) (Axillary)    Resp 14    Ht 5' (1.524 m)    Wt 50.8 kg    SpO2 91%    BMI 21.87 kg/m  General appearance: appears older than stated age, no distress, and slowed mentation Head: Normocephalic, without obvious abnormality, atraumatic Resp: clear to auscultation bilaterally Cardio: regular rate  and rhythm, S1, S2 normal, no murmur, click, rub or gallop GI: soft, non-tender; bowel sounds normal; no masses,  no organomegaly Extremities: s/p bilateral BKA's, no edema, LUE AVF +T/B Dialysis Access:  Dialysis Orders:  Center: [facility]  on MWF. 160NRe, 4 hours, BFR 400, DFR 500, EDW 51 kg, 3K, 2Ca, AVF 15g, heparin 2500 unit bolus UF profile 2 Mircera 75 mcg IV q 2 weeks- last dose 08/18/21 Hectorol 27mcg IV q HD Sensipar 30mg  2 times per week Velphoro 500mg  2 tabs PO TID with meals  Assessment/Plan:  Pneumonia with sepsis -  started on IV ceftriaxone, po azyithromycin.    ESRD -  Will continue with HD on MWF schedule.  She is below her edw   FTT - pt  losing weight with anorexia, worsening cognitive, nutritional, and functional status.  Agree with placement to SNF but would also recommend palliative care consult to help set goals/limits of care.  Hypertension/volume  - UF as tolerated  Anemia  - significant drop in Hgb.  Just received ESA so will need to transfuse if Hgb continues to fall.  Will need to r/o GI bleed.  Metabolic bone disease -  continue with home meds and need to check phos levels  Nutrition - severe protein malnutrition.  Will need protein supplements. Disposition - will need SNF placement as well as palliative care consult as above.   Donetta Potts, MD [place] Pager [phone] 08/20/2021, 9:02 AM

## 2021-08-20 NOTE — Consult Note (Signed)
° °  [facility] Inpatient Consult   08/20/2021  Carolyn Valenzuela 1951/06/12 [id number]  [place] [facility] [ACO] Patient: [place] [employer]  Primary Care Provider:  Biagio Borg, MD   is an embedded provider with a Chronic Care Management  [CCM] team and program, and is listed for the transition of care follow up and appointments. Patient is active in CCM program for hypertension.  Call received from inpatient [facility] RNCM regarding post hospital follow up needs.   Plan: Notification sent to the [place] [facility] and make aware of TOC needs for post hospital needs.  Please contact for further questions,  Natividad Brood, RN BSN [facility] Liaison  [phone] business mobile phone Toll free office [phone]  Fax number: [phone] [email or web address]

## 2021-08-20 NOTE — Procedures (Signed)
I was present at this dialysis session. I have reviewed the session itself and made appropriate changes.   Vital signs in last 24 hours:  Temp:  [97.4 F (36.3 C)-100.2 F (37.9 C)] 98 F (36.7 C) (12/21 0945) Pulse Rate:  [60-79] 62 (12/21 1030) Resp:  [12-18] 16 (12/21 0945) BP: (76-122)/(41-59) 103/43 (12/21 1030) SpO2:  [91 %-100 %] 94 % (12/21 0945) Weight:  [49.9 kg-50.8 kg] 50.8 kg (12/21 0039) Weight change:  Filed Weights   08/19/21 1155 08/20/21 0039  Weight: 49.9 kg 50.8 kg    Recent Labs  Lab 08/20/21 0126  NA 131*  K 3.6  CL 100  CO2 19*  GLUCOSE 147*  BUN 12  CREATININE 2.56*  CALCIUM 7.3*    Recent Labs  Lab 08/19/21 1258 08/20/21 0126  WBC 17.8* 9.3  NEUTROABS 14.7* 7.3  HGB 11.2* 7.8*  HCT 33.5* 22.2*  MCV 91.0 89.2  PLT 173 117*    Scheduled Meds:  aspirin EC  81 mg Oral Daily   brimonidine  1 drop Both Eyes TID   Chlorhexidine Gluconate Cloth  6 each Topical Q0600   cinacalcet  30 mg Oral Q M,W,F-1800   clopidogrel  75 mg Oral Daily   enoxaparin (LOVENOX) injection  30 mg Subcutaneous Q24H   gabapentin  200 mg Oral TID   insulin aspart  0-6 Units Subcutaneous TID WC   pantoprazole  40 mg Oral Daily   rosuvastatin  10 mg Oral Daily   Continuous Infusions:  sodium chloride     sodium chloride     albumin human     azithromycin 500 mg (08/20/21 0850)   cefTRIAXone (ROCEPHIN)  IV     PRN Meds:.sodium chloride, sodium chloride, acetaminophen **OR** acetaminophen, albuterol, alteplase, heparin, lidocaine (PF), lidocaine-prilocaine, ondansetron **OR** ondansetron (ZOFRAN) IV, pentafluoroprop-tetrafluoroeth   Donetta Potts,  MD 08/20/2021, 10:33 AM

## 2021-08-20 NOTE — TOC Initial Note (Signed)
Transition of Care [facility]) - Initial/Assessment Note    Patient Details  Name: Carolyn Valenzuela MRN: [id number] Date of Birth: 08-Sep-1950  Transition of Care [facility]) CM/SW Contact:    Bethena Roys, RN Phone Number: 08/20/2021, 3:54 PM  Clinical Narrative:  Case Manager attempted to complete the risk for readmission assessment. Patient unable to answer the questions. Patient states she lives with her niece; however, the niece is not listed in [place]. Case Manager did call the preferred number and her sister; however, no answer. Notes reflected in Epic that the patient is active with [facility] OT,PT, RN, Aide and CSW- Patient will need resumption orders and F2F once stable. [facility] is aware that the patient is hospitalized and they will follow the patient for additional needs. Case Manager will continue to follow for additional transition of care needs.              Expected Discharge Plan: [place] Barriers to Discharge: Continued Medical Work up   Patient Goals and CMS Choice Patient states their goals for this hospitalization and ongoing recovery are:: Patient states she lives with her niece.      Expected Discharge Plan and Services Expected Discharge Plan: [place] In-house Referral: NA Discharge Planning Services: CM Consult Post Acute Care Choice: Home Health, Resumption of Svcs/PTA Provider Living arrangements for the past 2 months: Apartment                 DME Arranged: N/A DME Agency: NA       HH Arranged: RN, Disease Management, PT, OT, Nurse's Aide, Social Work [employer] Agency: [place] Date HH Agency Contacted: 08/20/21 Time [place]: 36 Representative spoke with at [place]: [place] Arrangements/Services Living arrangements for the past 2 months: [place] with:: Self, Relatives Patient language and need for interpreter reviewed:: Yes Do you feel safe  going back to the place where you live?: Yes      Need for Family Participation in Patient Care: Yes (Comment) Care giver support system in place?: Yes (comment) Current home services: DME (Patient has a rollator per Epic notes.) Criminal Activity/Legal Involvement Pertinent to Current Situation/Hospitalization: No - Comment as needed  Activities of Daily Living Home Assistive Devices/Equipment: Prosthesis (bilateral prosthetic legs) ADL Screening (condition at time of admission) Patient's cognitive ability adequate to safely complete daily activities?: No Is the patient deaf or have difficulty hearing?: No Does the patient have difficulty seeing, even when wearing glasses/contacts?: No Does the patient have difficulty concentrating, remembering, or making decisions?: Yes Patient able to express need for assistance with ADLs?: No Does the patient have difficulty dressing or bathing?: Yes Independently performs ADLs?: No Communication: Needs assistance Is this a change from baseline?: Change from baseline, expected to last >3 days Dressing (OT): Needs assistance Is this a change from baseline?: Change from baseline, expected to last >3 days Grooming: Needs assistance Is this a change from baseline?: Change from baseline, expected to last >3 days Feeding: Needs assistance Is this a change from baseline?: Change from baseline, expected to last >3 days Bathing: Needs assistance Is this a change from baseline?: Change from baseline, expected to last >3 days Toileting: Needs assistance Is this a change from baseline?: Change from baseline, expected to last >3days In/Out Bed: Needs assistance Is this a change from baseline?: Change from baseline, expected to last >3 days Walks in Home: Needs assistance Is this a  change from baseline?: Change from baseline, expected to last >3 days Does the patient have difficulty walking or climbing stairs?: Yes Weakness of Legs: Both Weakness of  Arms/Hands: Both  Permission Sought/Granted Permission sought to share information with : Case Manager, Customer service manager, Family Supports       Permission granted to share info w AGENCY: [facility]        Emotional Assessment Appearance:: Appears stated age Attitude/Demeanor/Rapport: Unable to Assess Affect (typically observed): Unable to Assess   Alcohol / Substance Use: Not Applicable Psych Involvement: No (comment)  Admission diagnosis:  CAP (community acquired pneumonia) [J18.9] Generalized weakness [R53.1] Sepsis due to pneumonia ([place]) [J18.9, A41.9] Patient Active Problem List   Diagnosis Date Noted   FTT (failure to thrive) in adult 08/20/2021   Pressure injury of skin 08/20/2021   Sepsis due to pneumonia ([place]) 08/19/2021   Severe protein-calorie malnutrition ([place]) 08/19/2021   Anemia in ESRD (end-stage renal disease) ([place]) 08/19/2021   Hyperbilirubinemia 08/19/2021   Physical deconditioning 06/29/2021   Cholelithiases 06/26/2021   Choledocholithiasis 06/25/2021   Lump of right breast 02/16/2021   Gait disorder 02/16/2021   Finger pain 02/16/2021   Right shoulder pain 02/16/2021   Other disorders of plasma-protein metabolism, not elsewhere classified 01/13/2021   Encounter for screening for COVID-19 04/22/2020   Allergy, unspecified, initial encounter 03/28/2020   Anaphylactic shock, unspecified, initial encounter 03/28/2020   Chronic low back pain 03/12/2020   Closed displaced fracture of shaft of fifth metacarpal bone of left hand 04/25/2019   Left hip pain 11/18/2017   GERD (gastroesophageal reflux disease) 10/10/2017   Bloating 10/10/2017   PVD (peripheral vascular disease) ([place])    Phantom pain after amputation of lower extremity ([place]) 01/05/2017   Encounter for removal of sutures 12/01/2016   Abnormal CXR 07/07/2016   Pneumonia, unspecified organism 07/07/2016   Hypotension    NSTEMI (non-ST elevated myocardial infarction) ([place])     Encounter for immunization 05/27/2016   Nausea and vomiting 03/18/2016   Hypokalemia 01/31/2016   Sepsis ([place]) due to UTI 12/20/2015   Acute lower UTI    Labile blood pressure    SIRS (systemic inflammatory response syndrome) ([place])    Hypoglycemia associated with diabetes ([place])    Urinary retention    Dysphagia    S/P bilateral BKA (below knee amputation) ([place])    Abnormality of gait    Muscle spasm    ESRD on dialysis ([place])    Type 2 diabetes mellitus with diabetic peripheral angiopathy and gangrene, without long-term current use of insulin (HCC)    Post-operative pain    Metabolic bone disease    Hypoxia    Type 2 diabetes mellitus with peripheral neuropathy (HCC)    Labile blood glucose    Postoperative pain    Acute blood loss anemia    Anemia of chronic disease    Below knee amputation status 11/29/2015   Odynophagia 11/12/2015   Unspecified disorder of calcium metabolism 07/31/2015   Chest pain, atypical 06/06/2015   Ingrown nail 05/28/2015   Other fluid overload 02/15/2015   Shortness of breath 12/13/2014   Diarrhea, unspecified 12/13/2014   Pain, unspecified 12/13/2014   Pruritus, unspecified 12/13/2014   Unspecified protein-calorie malnutrition ([place]) 10/17/2014   Acute pulmonary edema (HCC)    FUO (fever of unknown origin)    Complication of vascular dialysis catheter 09/10/2014   Fever, unspecified 09/10/2014   HCAP (healthcare-associated pneumonia)    Coagulation defect, unspecified ([place]) 09/06/2014   Radiculopathy,  cervical region 09/06/2014   End stage renal disease ([place]) 09/06/2014   Essential (primary) hypertension 09/06/2014   Hyperlipidemia, unspecified 09/06/2014   Encounter for screening for respiratory tuberculosis 09/06/2014   Secondary hyperparathyroidism of renal origin ([place]) 09/06/2014   Anemia in chronic kidney disease 09/06/2014   Type 2 diabetes mellitus with ESRD (end-stage renal disease) ([place]) 09/01/2014   Anemia of renal disease 09/01/2014    Venous insufficiency of left leg 08/27/2014   Rash 07/06/2014   Diabetic wet gangrene of the foot - Right 03/15/2014   Critical lower limb ischemia ([place]) 01/09/2014   Right foot ulcer ([place]) 11/07/2013   Toe pain 09/06/2013   Pre-ulcerative corn or callous 09/06/2013   Lower extremity pain 09/06/2013   Low back pain 12/08/2011   Hip bursitis, left 12/08/2011   Left leg pain 12/08/2011   Abdominal pain 07/08/2011   Encounter for well adult exam with abnormal findings 12/08/2010   Allergic rhinitis, cause unspecified 12/08/2010   Type 2 diabetes mellitus with diabetic chronic kidney disease ([place]) 05/21/2009   SECONDARY HYPERPARATHYROIDISM 05/21/2009   NUMBNESS 05/21/2009   BACK PAIN 05/02/2009   ANEMIA-IRON DEFICIENCY 01/25/2008   Essential hypertension 01/25/2008   Brachial neuritis or radiculitis 01/25/2008   Hyperlipidemia 03/30/2007   PCP:  Biagio Borg, MD Pharmacy:   [id number] [address], [place] [address] Phone: [phone] Fax: [phone]     Social Determinants of Health (SDOH) Interventions    Readmission Risk Interventions No flowsheet data found.

## 2021-08-20 NOTE — Assessment & Plan Note (Signed)
Lab Results  Component Value Date   HGBA1C 5.4 06/25/2021   Stable, pt to continue current medical treatment  - diet

## 2021-08-21 ENCOUNTER — Telehealth: Payer: Medicare PPO

## 2021-08-21 DIAGNOSIS — Z515 Encounter for palliative care: Secondary | ICD-10-CM

## 2021-08-21 DIAGNOSIS — Z66 Do not resuscitate: Secondary | ICD-10-CM

## 2021-08-21 DIAGNOSIS — Z7189 Other specified counseling: Secondary | ICD-10-CM

## 2021-08-21 LAB — GLUCOSE, CAPILLARY
Glucose-Capillary: 111 mg/dL — ABNORMAL HIGH (ref 70–99)
Glucose-Capillary: 113 mg/dL — ABNORMAL HIGH (ref 70–99)
Glucose-Capillary: 132 mg/dL — ABNORMAL HIGH (ref 70–99)
Glucose-Capillary: 251 mg/dL — ABNORMAL HIGH (ref 70–99)

## 2021-08-21 LAB — RENAL FUNCTION PANEL
Albumin: 1.8 g/dL — ABNORMAL LOW (ref 3.5–5.0)
Anion gap: 7 (ref 5–15)
BUN: 8 mg/dL (ref 8–23)
CO2: 24 mmol/L (ref 22–32)
Calcium: 7.4 mg/dL — ABNORMAL LOW (ref 8.9–10.3)
Chloride: 98 mmol/L (ref 98–111)
Creatinine, Ser: 2.26 mg/dL — ABNORMAL HIGH (ref 0.44–1.00)
GFR, Estimated: 23 mL/min — ABNORMAL LOW (ref >60)
Glucose, Bld: 100 mg/dL — ABNORMAL HIGH (ref 70–99)
Phosphorus: 1.4 mg/dL — ABNORMAL LOW (ref 2.5–4.6)
Potassium: 4.1 mmol/L (ref 3.5–5.1)
Sodium: 129 mmol/L — ABNORMAL LOW (ref 135–145)

## 2021-08-21 LAB — HEPATITIS B SURFACE ANTIBODY, QUANTITATIVE: Hep B S AB Quant (Post): 17.1 m[IU]/mL (ref >9.9)

## 2021-08-21 MED ORDER — PENTAFLUOROPROP-TETRAFLUOROETH EX AERO: 1.0000 "application " | INHALATION_SPRAY | CUTANEOUS | Status: DC | PRN

## 2021-08-21 MED ORDER — SODIUM CHLORIDE 0.9 % IV SOLN: 100.0000 mL | INTRAVENOUS | Status: DC | PRN

## 2021-08-21 MED ORDER — LOPERAMIDE HCL 2 MG PO CAPS
2.0000 mg | ORAL_CAPSULE | ORAL | Status: DC | PRN
  Administered 2021-08-21 (×2): 2 mg via ORAL
  Filled 2021-08-21 (×2): qty 1

## 2021-08-21 MED ORDER — PROSOURCE PLUS PO LIQD
30.0000 mL | Freq: Three times a day (TID) | ORAL | Status: DC
  Administered 2021-08-21: 15:00:00 30 mL via ORAL
  Filled 2021-08-21: qty 30

## 2021-08-21 MED ORDER — ALTEPLASE 2 MG IJ SOLR: 2.0000 mg | Freq: Once | INTRAMUSCULAR | Status: DC | PRN

## 2021-08-21 MED ORDER — NEPRO/CARBSTEADY PO LIQD
237.0000 mL | Freq: Two times a day (BID) | ORAL | Status: DC
  Administered 2021-08-21: 15:00:00 237 mL via ORAL

## 2021-08-21 MED ORDER — HEPARIN SODIUM (PORCINE) 1000 UNIT/ML DIALYSIS: 1000.0000 [IU] | INTRAMUSCULAR | Status: DC | PRN

## 2021-08-21 MED ORDER — LIDOCAINE HCL (PF) 1 % IJ SOLN: 5.0000 mL | INTRAMUSCULAR | Status: DC | PRN

## 2021-08-21 MED ORDER — LIDOCAINE-PRILOCAINE 2.5-2.5 % EX CREA: 1.0000 "application " | TOPICAL_CREAM | CUTANEOUS | Status: DC | PRN

## 2021-08-21 NOTE — Consult Note (Signed)
Palliative Medicine Inpatient Consult Note  Consulting Provider: Patrecia Pour, MD  Reason for consult:   Carolyn Valenzuela Palliative Medicine Consult  Reason for Consult? FTT, malnutrition, debility. Assistance with family/pt discussions regarding goals/limits of care and code status would be appreciated. Anticipate palliative care following at SNF.   HPI:  Per intake H&P --> Carolyn Valenzuela is a 70 y.o. female with medical history significant of seasonal allergies, iron deficiency anemia, arctic regurgitation, CAD, left cervical radiculopathy, history of critical lower limb ischemia, bilateral BKA, uncontrolled type II DM, esophagitis, ESRD on HD, history of HCAP, hyperlipidemia, hypertension, ischemic cardiomyopathy, mitral regurgitation, peripheral vascular disease, secondary hyperparathyroidism who is sent by her PCP for evaluation and admission to a nursing home.  She also has a wound on her left stump.  She has had decreased appetite and weight loss.  Her generalized weakness has become worse.  She has been home for a few weeks after being discharged to [facility] rehab where she spent about 20 days after her last admission in October.  She is able to answer simple questions denied headache, chest pain, back or abdominal pain at this time.  Palliative care has been asked to get involved to address goals of care in the setting of multiple co-morbid conditions and increased frailty.   Clinical Assessment/Goals of Care:  *Please note that this is a verbal dictation therefore any spelling or grammatical errors are due to the "[place] One" system interpretation.  I have reviewed medical records including EPIC notes, labs and imaging, received report from bedside RN, assessed the patient who would briefly arouse and fall asleep after sharing a few sentences.    I called patient's sister Carolyn Valenzuela to further discuss diagnosis prognosis, [place]yn Val[place], EOL wishes, disposition and  options.  Carolyn Valenzuela and I reviewed Carolyn Valenzuela's chronic disease processes inclusive of her coronary artery disease, type 2 diabetes, peripheral vascular disease, and end-stage renal disease.   I introduced Palliative Medicine as specialized medical care for people living with serious illness. It focuses on providing relief from the symptoms and stress of a serious illness. The goal is to improve quality of life for both the patient and the family.  Carolyn Valenzuela is from [place], [place].  She moved to [place] at the age of 35.  She never married nor did she have children.  Carolyn Valenzuela is 1 of 4 children and has 2 sisters and 1 brother.  She worked at [employer] in her younger years and later at [place] in [place]  She enjoyed cooking as well as Western movies.  She is a woman of faith and practices within the [facility] denomination.  Prior to admission Carolyn Valenzuela had been living in an apartment with her full-time caregiver, [place] who is a family friend and had also cared for her mother during her aged years. [place] helps Carolyn Valenzuela with all basic activities of daily living.  Per Carolyn Valenzuela up until about 3 months ago Carolyn Valenzuela had been chronically ill but maintaining her health fairly well.  She was able to mobilize with prosthetics and participate in daily care activities.  Unfortunately, Carolyn Valenzuela has had a couple of hospital admissions which have made her condition worse causing her to become far weaker and less motivated to participate in healthcare.  Carolyn Valenzuela expresses that over the past few months [place] has continuously been sharing that she is ready to meet her mother who has been deceased for over 71 years.  Carolyn Valenzuela states that she  realizes this is something which more than likely indicates [place]'s short course left on earth.  A detailed discussion was had today regarding advanced directives-these had never been completed.  Carolyn Valenzuela and I reviewed that she is the eldest sibling and she has  been helping to make decisions for [place]a though she also relies upon one of her younger sisters as well as Carolyn Val[place]uela's primary caregiver [place].  Concepts specific to code status, artifical feeding and hydration, continued IV antibiotics and rehospitalization was had.  [place] very clearly states that she and her family are of the mindset that when you are gone you were gone and additional measures to sustain life are not aligned with [place]yn Val[place]a's personal goals.  Carolyn Valenzuela and I reviewed what a DO NOT RESUSCITATE order is and entails.  Carolyn Valenzuela would like to allow for [place]yn Val[place]a to have a natural death when the time comes.  We reviewed that given Carolyn Valenzuela's chronic comorbid conditions there is concern that she may not fare extremely well at skilled nursing.  I asked if Carolyn Valenzuela, her sister, and [place]yn Val[place]a would like to come in for further conversations about next steps inclusive of potentially hospice.  Carolyn Valenzuela is in agreement to meet tomorrow at 24 AM.  Discussed the importance of continued conversation with family and their  medical providers regarding overall plan of care and treatment options, ensuring decisions are within the context of the patients values and GOCs.  Decision Maker: [place] (self): [phone]  SUMMARY OF RECOMMENDATIONS   DNAR/DNI  Treat what is treatable  Will place gold DNR on chart  Patient's 2 sisters and caregiver plan to come in at 2 tomorrow for additional conversations pertaining to patient's acute on chronic disease processes  Ongoing palliative support  Code Status/Advance Care Planning: DNAR/DNI   Palliative Prophylaxis:  Oral care, mobility, delirium precautions  Additional Recommendations (Limitations, Scope, Preferences): Continue to treat what is treatable   Psycho-social/Spiritual:  Desire for further Chaplaincy support: Declines Additional Recommendations: Education on chronic comorbidities   Prognosis: Poor prognosis in the setting of progressive frailty,  hypoalbuminemia, multiple chronic conditions.  Pneumonia  Discharge Planning: Discharge plan is uncertain at this time though family had been considering skilled nursing.   Vitals:   08/21/21 0419 08/21/21 0519  BP: (!) 115/56 (!) 112/51  Pulse: 81 79  Resp: 18 16  Temp: 98.1 F (36.7 C)   SpO2: 95% 95%    Intake/Output Summary (Last 24 hours) at 08/21/2021 7672 Last data filed at 08/20/2021 1330 Gross per 24 hour  Intake --  Output 0 ml  Net 0 ml   Last Weight  Most recent update: 08/20/2021  3:23 PM    Weight  50.8 kg (111 lb 15.9 oz)            Gen: Elderly female appears older than stated age in no acute distress HEENT: moist mucous membranes CV: Regular rate and rhythm PULM: On room air this morning ABD: soft/nontender EXT: Bilateral BKA's Neuro: Alert and oriented x2  PPS: 20%   This conversation/these recommendations were discussed with patient primary care team, Dr. Bonner Puna  Time In: 0900 Time Out: 1010 Total Time: 70 Greater than 50%  of this time was spent counseling and coordinating care related to the above assessment and plan.  [place] Team Team Cell Phone: [phone] Please utilize secure chat with additional questions, if there is no response within 30 minutes please call the above phone number  Palliative Medicine Team providers are available by  phone from 7am to 7pm daily and can be reached through the team cell phone.  Should this patient require assistance outside of these hours, please call the patient's attending physician.

## 2021-08-21 NOTE — Progress Notes (Addendum)
[facility] Progress Note   Subjective:    Seen and examined patient at bedside. No complaints/concerns. Denies SOB, CP, and N/V. Plan for HD 08/22/21 per usual schedule.  Objective Vitals:   08/20/21 1953 08/21/21 0019 08/21/21 0419 08/21/21 0519  BP: (!) 103/51 (!) 103/51 (!) 115/56 (!) 112/51  Pulse: 67 62 81 79  Resp: 19 20 18 16   Temp: 98.9 F (37.2 C) 98.7 F (37.1 C) 98.1 F (36.7 C)   TempSrc: Oral Oral Oral   SpO2: (!) 89% 90% 95% 95%  Weight:      Height:       Physical Exam General: Older female; ill-appearing; on O2; NAD Heart: S1 and S2; No murmurs, gallops, or rubs Lungs: Clear anteriorly and laterally; No wheezing, rales, or rhonchi Abdomen: Soft, non-tender, active bowel sounds Extremities:BL BKAs; No edema upper thighs Dialysis Access: LUE AVF (+) Bruit/Thrill   Filed Weights   08/20/21 0039 08/20/21 0945 08/20/21 1340  Weight: 50.8 kg 52.1 kg 50.8 kg    Intake/Output Summary (Last 24 hours) at 08/21/2021 1055 Last data filed at 08/20/2021 1330 Gross per 24 hour  Intake --  Output 0 ml  Net 0 ml    Additional Objective Labs: Basic Metabolic Panel: Recent Labs  Lab 08/19/21 1258 08/20/21 0126 08/21/21 0404  NA 130* 131* 129*  K 3.6 3.6 4.1  CL 95* 100 98  CO2 24 19* 24  GLUCOSE 299* 147* 100*  BUN 13 12 8   CREATININE 3.12* 2.56* 2.26*  CALCIUM 8.1* 7.3* 7.4*  PHOS  --   --  1.4*   Liver Function Tests: Recent Labs  Lab 08/19/21 1258 08/20/21 0126 08/21/21 0404  AST 24 16  --   ALT 15 12  --   ALKPHOS 92 57  --   BILITOT 1.8* 0.8  --   PROT 6.1* 3.7*  --   ALBUMIN 2.2* <1.5* 1.8*   No results for input(s): LIPASE, AMYLASE in the last 168 hours. CBC: Recent Labs  Lab 08/19/21 1258 08/20/21 0126 08/20/21 1521  WBC 17.8* 9.3  --   NEUTROABS 14.7* 7.3  --   HGB 11.2* 7.8* 9.6*  HCT 33.5* 22.2* 27.0*  MCV 91.0 89.2  --   PLT 173 117*  --    Blood Culture    Component Value Date/Time   SDES BLOOD RIGHT  HAND 08/20/2021 0125   SPECREQUEST  08/20/2021 0125    AEROBIC BOTTLE ONLY Blood Culture results may not be optimal due to an inadequate volume of blood received in culture bottles   CULT  08/20/2021 0125    NO GROWTH 1 DAY Performed at [facility], [place] [address] [place], [place] [address]    REPTSTATUS PENDING 08/20/2021 0125    Cardiac Enzymes: No results for input(s): CKTOTAL, CKMB, CKMBINDEX, TROPONINI in the last 168 hours. CBG: Recent Labs  Lab 08/20/21 0045 08/20/21 0732 08/20/21 1635 08/20/21 2123 08/21/21 0739  GLUCAP 148* 145* 97 123* 111*   Iron Studies: No results for input(s): IRON, TIBC, TRANSFERRIN, FERRITIN in the last 72 hours. Lab Results  Component Value Date   INR 2.1 (H) 08/20/2021   INR 1.06 06/30/2016   INR 1.22 12/11/2015   Studies/Results: DG Chest 2 View  Result Date: 08/19/2021 CLINICAL DATA:  Altered mental status. EXAM: CHEST - 2 VIEW COMPARISON:  CT chest and chest x-ray dated June 25, 2021. FINDINGS: Unchanged cardiomegaly. New patchy consolidation in the left greater than right upper lobes. Increased  small bilateral pleural effusions with bibasilar atelectasis. No pneumothorax. No acute osseous abnormality. Unchanged left subclavian and axillary stents. IMPRESSION: 1. New bilateral upper lobe pneumonia. 2. Increased small bilateral pleural effusions. Electronically Signed   By: Titus Dubin M.D.   On: 08/19/2021 12:53   CT Head Wo Contrast  Result Date: 08/19/2021 CLINICAL DATA:  Altered mental status, nontraumatic (Ped 0-17y) EXAM: CT HEAD WITHOUT CONTRAST TECHNIQUE: Contiguous axial images were obtained from the base of the skull through the vertex without intravenous contrast. COMPARISON:  04/21/2019. FINDINGS: Brain: No evidence of acute infarction, hemorrhage, hydrocephalus, extra-axial collection or mass lesion/mass effect. Progressive atrophy and ex vacuo ventricular dilation. Patchy white matter hypoattenuation, nonspecific  but compatible with chronic microvascular ischemic disease. Vascular: No hyperdense vessel identified. Calcific intracranial atherosclerosis. Skull: No acute fracture. Sinuses/Orbits: Visualized sinuses are clear. Unremarkable visualized orbits. Other: Left mastoid effusion. IMPRESSION: 1. No evidence of acute intracranial abnormality. 2. Since 2020, progressive atrophy and ex vacuo ventricular dilation. 3. Left mastoid effusion Electronically Signed   By: Margaretha Sheffield M.D.   On: 08/19/2021 14:48    Medications:  azithromycin 500 mg (08/20/21 0850)   cefTRIAXone (ROCEPHIN)  IV 2 g (08/20/21 1630)    aspirin EC  81 mg Oral Daily   brimonidine  1 drop Both Eyes TID   Chlorhexidine Gluconate Cloth  6 each Topical Q0600   cinacalcet  30 mg Oral Q M,W,F-1800   clopidogrel  75 mg Oral Daily   enoxaparin (LOVENOX) injection  30 mg Subcutaneous Q24H   gabapentin  200 mg Oral TID   insulin aspart  0-6 Units Subcutaneous TID WC   pantoprazole  40 mg Oral Daily   rosuvastatin  10 mg Oral Daily    Dialysis Orders: Center: [facility]  on MWF. 160NRe, 4 hours, BFR 400, DFR 500, EDW 51 kg, 3K, 2Ca, AVF 15g, heparin 2500 unit bolus UF profile 2 Mircera 75 mcg IV q 2 weeks- last dose 08/18/21 Hectorol 17mcg IV q HD Sensipar 30mg  2 times per week Velphoro 500mg  2 tabs PO TID with meals   Assessment/Plan: Pneumonia with sepsis -  continue ceftriaxone and azyithromycin.  WBC and lactate down-trending. Managed by primary. ESRD -  Will continue with HD on MWF schedule.  She is below her edw-will need to lower at discharge. FTT - pt losing weight with anorexia, worsening cognitive, nutritional, and functional status. Palliative care consulted. Hypertension/volume  - Bps stable and euvolemic on exam. UF as tolerated Anemia  - significant drop in Hgb at admit. Hgb now 9.6. Just received ESA so will need to transfuse if Hgb continues to fall.  Will need to r/o GI bleed. Metabolic bone disease -  CorrCa ok, PO4  low-poor nutrition. Currently not on binders. Continue with home meds. Will check PO4 level in AM. Nutrition - severe protein malnutrition. Protein supplements ordered today. Disposition - will need SNF placement. Palliative care consulted-reviewed note: family meeting scheduled 12/23 at [facility] for ongoing discussion on patient's current medical condition.  Tobie Poet, NP [facility] 08/21/2021,10:55 AM  LOS: 2 days    I have seen and examined this patient and agree with plan and assessment in the above note with renal recommendations/intervention highlighted.  Will continue with HD on MWF schedule while she remains an inpatient.  Awaiting SNF placement.  Broadus John A Rani Idler,MD 08/21/2021 1:25 PM

## 2021-08-21 NOTE — Evaluation (Signed)
Physical Therapy Evaluation Patient Details Name: Carolyn Valenzuela MRN: [id number] DOB: Jan 01, 1951 Today's Date: 08/21/2021  History of Present Illness  70 y.o. female sent from PCP to [facility] 12/20 for treatment of increased generalized weakness. Pt found to have sepsis secondary to bilateral upper lobe pneumonia. PMH: seasonal allergies, iron deficiency anemia, arctic regurgitation, CAD, left cervical radiculopathy, history of critical lower limb ischemia, bilateral BKA, uncontrolled type II DM, esophagitis, ESRD on HD,  HCAP, hyperlipidemia, hypertension, ischemic cardiomyopathy, mitral regurgitation, peripheral vascular disease, secondary hyperparathyroidism  Clinical Impression  Pt is poor historian, reports she lives alone, however per notes niece provides 24 hour care, can not determine if care is given in her home or family home. Pt reports that niece provides assist for mobility and that friends and family provide her with meals. Pt report [employer] provides transportation for dialysis. Pt oriented to self and has poor awareness of situation. Reports that she was walking with her prosthetics last week, but given weakness and wound on R residual limb this is unlikely. Pt limited in mobility by decreased strength, balance and endurance.requiring total A for all mobility with the exception of rolling onto her R with assist of R rail. PT recommending SNF level rehab to regain strength to be able to provide assist with her mobility. PT will continue to follow acutely.      Recommendations for follow up therapy are one component of a multi-disciplinary discharge planning process, led by the attending physician.  Recommendations may be updated based on patient status, additional functional criteria and insurance authorization.  Follow Up Recommendations Skilled nursing-short term rehab (<3 hours/day)    Assistance Recommended at Discharge Frequent or constant Supervision/Assistance  Functional Status  Assessment Patient has had a recent decline in their functional status and demonstrates the ability to make significant improvements in function in a reasonable and predictable amount of time.  Equipment Recommendations  None recommended by PT       Precautions / Restrictions Precautions Precautions: Fall Restrictions Weight Bearing Restrictions: No      Mobility  Bed Mobility Overal bed mobility: Needs Assistance Bed Mobility: Rolling;Supine to Sit;Sit to Supine Rolling: Total assist;Min assist   Supine to sit: Total assist;+2 for physical assistance Sit to supine: Total assist;+2 for physical assistance   General bed mobility comments: pt able to move LE across bed surface with heavy reliance on R residual limb to "pull" across bed, likely reason for R residual limb wound, pt quickly fatigues and requires total A for transfer to EoB, when asked to get back to supine, pt falls back posteriorly and requires totalAx2 for use of pad to square pt and pull her up in the bed, pt able to use the bed rail and turn to the R with min A, requires total A to roll to the L    Transfers                   General transfer comment: unable due to weakness       Balance Overall balance assessment: Needs assistance Sitting-balance support: Feet unsupported;Bilateral upper extremity supported;Single extremity supported Sitting balance-Leahy Scale: Poor Sitting balance - Comments: initially requires max A to maintain balance, with time able to maintain balance with bilateral UE support and min guard assist. Postural control: Posterior lean  Pertinent Vitals/Pain Pain Assessment: Faces Faces Pain Scale: Hurts even more Pain Location: R shoulder with movement Pain Descriptors / Indicators: Grimacing;Guarding;Moaning Pain Intervention(s): Limited activity within patient's tolerance;Monitored during session;Repositioned    Home Living  Family/patient expects to be discharged to:: Unsure Living Arrangements: Other (Comment) (reports she still lives alone, however per notes)                      Prior Function Prior Level of Function : Needs assist;History of Falls (last six months);Patient poor historian/Family not available (pt poor historian, reports she lives alone, per prior notes has niece who takes care of her and assists her with mobility, [employer] takes her to dialysis)                     Hand Dominance   Dominant Hand: Right    Extremity/Trunk Assessment   Upper Extremity Assessment Upper Extremity Assessment: Defer to OT evaluation    Lower Extremity Assessment Lower Extremity Assessment: RLE deficits/detail;LLE deficits/detail RLE Deficits / Details: R BKA, R residual limb wound covered with foam dressing, hip and knee ROM WFL, strength grossly 3/5 LLE Deficits / Details: R BKA, R residual limb wound, hip and knee ROM WFL, strength grossly 3/5    Cervical / Trunk Assessment Cervical / Trunk Assessment: Kyphotic  Communication   Communication: HOH  Cognition Arousal/Alertness: Awake/alert Behavior During Therapy: WFL for tasks assessed/performed;Flat affect Overall Cognitive Status: History of cognitive impairments - at baseline                                 General Comments: disoriented to situation, and poor historian        General Comments General comments (skin integrity, edema, etc.): Pt on 1 L O2 via [place] on entry with SpO2 97%O2, removed [place] and pt able to maintain SpO2 in mid 90s however pt noted to hold breath with movement and SpO2 dropped to low 80s with good pleth wave form, supplemental O2 returned at 1L via [place] and SpO2 rebounded back to 94%O2, HR in 80 bpm and BP in line with previous values        Assessment/Plan    PT Assessment Patient needs continued PT services  PT Problem List Decreased strength;Decreased range of motion;Decreased activity  tolerance;Decreased balance;Decreased mobility;Decreased coordination;Decreased cognition;Decreased safety awareness;Pain;Decreased skin integrity       PT Treatment Interventions DME instruction;Functional mobility training;Therapeutic activities;Therapeutic exercise;Balance training;Cognitive remediation;Patient/family education    PT Goals (Current goals can be found in the Care Plan section)  Acute Rehab PT Goals Patient Stated Goal: none stated PT Goal Formulation: Patient unable to participate in goal setting Time For Goal Achievement: 09/29/2021    Frequency Min 2X/week    AM-PAC PT "6 Clicks" Mobility  Outcome Measure Help needed turning from your back to your side while in a flat bed without using bedrails?: A Little Help needed moving from lying on your back to sitting on the side of a flat bed without using bedrails?: Total Help needed moving to and from a bed to a chair (including a wheelchair)?: Total Help needed standing up from a chair using your arms (e.g., wheelchair or bedside chair)?: Total Help needed to walk in hospital room?: Total Help needed climbing 3-5 steps with a railing? : Total 6 Click Score: 8    End of Session Equipment Utilized During Treatment: Oxygen Activity Tolerance: Patient  tolerated treatment well Patient left: in bed;with call bell/phone within reach;with bed alarm set   PT Visit Diagnosis: Other abnormalities of gait and mobility (R26.89);Muscle weakness (generalized) (M62.81);History of falling (Z91.81);Pain Pain - Right/Left: Right Pain - part of body: Shoulder    Time: 0017-4944 PT Time Calculation (min) (ACUTE ONLY): 29 min   Charges:   PT Evaluation $PT Eval Moderate Complexity: 1 Mod          Ahlana Slaydon B. Migdalia Dk PT, DPT Acute Rehabilitation Services Pager [phone] Office [phone]   [place] 08/21/2021, 10:32 AM

## 2021-08-21 NOTE — Evaluation (Signed)
Occupational Therapy Evaluation Patient Details Name: Carolyn Valenzuela MRN: [id number] DOB: 1951/01/31 Today's Date: 08/21/2021   History of Present Illness 70 y.o. female sent from PCP to [facility] 12/20 for treatment of increased generalized weakness. Pt found to have sepsis secondary to bilateral upper lobe pneumonia. PMH: seasonal allergies, iron deficiency anemia, arctic regurgitation, CAD, left cervical radiculopathy, history of critical lower limb ischemia, bilateral BKA, uncontrolled type II DM, esophagitis, ESRD on HD,  HCAP, hyperlipidemia, hypertension, ischemic cardiomyopathy, mitral regurgitation, peripheral vascular disease, secondary hyperparathyroidism   Clinical Impression   Pt presents with decline in function and safety with ADLs and ADL mobility with impaired strength, balance, endurance and cognition.  Pt is poor historian, reports she lives alone, however per notes niece provides 24 hour care, can not determine if care is given in her home or family home. Pt reports that she is Ind with ADLs/selfcare and that her niece provides assist for mobility and that friends and family provide her with meals. Pt report [employer] provides transportation for dialysis. Pt oriented to self and has poor awareness of situation. Reports that she was walking with her prosthetics last week, but given weakness and wound on R residual limb this is unlikely. Pt currently requires total A +2 for bed mobility to sit OEB, mod - max A with ADLs/selfcare. Pt would benefit from acute OT services to address impairments to maximize level of function and safety     Recommendations for follow up therapy are one component of a multi-disciplinary discharge planning process, led by the attending physician.  Recommendations may be updated based on patient status, additional functional criteria and insurance authorization.   Follow Up Recommendations  Skilled nursing-short term rehab (<3 hours/day)    Assistance  Recommended at Discharge Frequent or constant Supervision/Assistance  Functional Status Assessment  Patient has had a recent decline in their functional status and demonstrates the ability to make significant improvements in function in a reasonable and predictable amount of time.  Equipment Recommendations  None recommended by OT    Recommendations for Other Services       Precautions / Restrictions Precautions Precautions: Fall Restrictions Weight Bearing Restrictions: No      Mobility Bed Mobility Overal bed mobility: Needs Assistance Bed Mobility: Rolling;Supine to Sit;Sit to Supine Rolling: Total assist;Min assist   Supine to sit: Total assist;+2 for physical assistance Sit to supine: Total assist;+2 for physical assistance   General bed mobility comments: pt able to move LE across bed surface with heavy reliance on R residual limb to "pull" across bed, likely reason for R residual limb wound, pt quickly fatigues and requires total A for transfer to EoB, when asked to get back to supine, pt falls back posteriorly and requires totalAx2 for use of pad to square pt and pull her up in the bed, pt able to use the bed rail and turn to the R with min A, requires total A to roll to the L    Transfers                   General transfer comment: unable due to weakness      Balance Overall balance assessment: Needs assistance Sitting-balance support: Feet unsupported;Bilateral upper extremity supported;Single extremity supported Sitting balance-Leahy Scale: Poor Sitting balance - Comments: initially requires max A to maintain balance, with time able to maintain balance with bilateral UE support and min guard assist. Postural control: Posterior lean  ADL either performed or assessed with clinical judgement   ADL Overall ADL's : Needs assistance/impaired Eating/Feeding: Set up;Supervision/ safety;Sitting;Bed level   Grooming:  Wash/dry hands;Wash/dry face;Minimal assistance;Sitting   Upper Body Bathing: Moderate assistance;Sitting   Lower Body Bathing: Maximal assistance;Sitting/lateral leans   Upper Body Dressing : Moderate assistance;Sitting           Toileting- Clothing Manipulation and Hygiene: Total assistance;Bed level               Vision Baseline Vision/History: 1 Wears glasses Ability to See in Adequate Light: 0 Adequate Patient Visual Report: No change from baseline       Perception     Praxis      Pertinent Vitals/Pain Pain Assessment: Faces Faces Pain Scale: Hurts even more Pain Location: R shoulder with movement Pain Descriptors / Indicators: Grimacing;Guarding;Moaning Pain Intervention(s): Monitored during session;Limited activity within patient's tolerance;Repositioned     Hand Dominance Right   Extremity/Trunk Assessment Upper Extremity Assessment Upper Extremity Assessment: Generalized weakness   Lower Extremity Assessment Lower Extremity Assessment: Defer to PT evaluation RLE Deficits / Details: R BKA, R residual limb wound covered with foam dressing, hip and knee ROM WFL, strength grossly 3/5 LLE Deficits / Details: R BKA, R residual limb wound, hip and knee ROM WFL, strength grossly 3/5   Cervical / Trunk Assessment Cervical / Trunk Assessment: Kyphotic   Communication Communication Communication: HOH   Cognition Arousal/Alertness: Awake/alert Behavior During Therapy: WFL for tasks assessed/performed;Flat affect Overall Cognitive Status: History of cognitive impairments - at baseline                                 General Comments: disoriented to situation, and poor historian     General Comments  Pt on 1 L O2 via [place] on entry with SpO2 97%O2, removed [place] and pt able to maintain SpO2 in mid 90s however pt noted to hold breath with movement and SpO2 dropped to low 80s with good pleth wave form, supplemental O2 returned at 1L via [place] and SpO2  rebounded back to 94%O2, HR in 80 bpm and BP in line with previous values    Exercises     Shoulder Instructions      Home Living Family/patient expects to be discharged to:: Unsure Living Arrangements: Other (Comment)                               Additional Comments: pt reports that she lives alone, however per notes pt lives with her niece      Prior Functioning/Environment Prior Level of Function : Patient poor historian/Family not available;History of Falls (last six months);Needs assist  Cognitive Assist : Mobility (cognitive) Mobility (Cognitive): Step by step cues   Physical Assist : Mobility (physical) Mobility (physical): Bed mobility;Transfers;Gait   Mobility Comments: pt reports she is fine at home but notes indicate issues ADLs Comments: Pt reports she was able to bathe, dress and toilet self, family also brings in meals. Pt reporting that she lves alone, however notes say that she lives with her niece and uncertain if niece is assisting her with ADLs        OT Problem List: Decreased strength;Decreased knowledge of use of DME or AE;Decreased coordination;Decreased range of motion;Decreased activity tolerance;Decreased cognition;Decreased safety awareness;Impaired balance (sitting and/or standing)      OT Treatment/Interventions: Self-care/ADL training;Patient/family education;Balance training;Therapeutic activities;DME and/or  AE instruction    OT Goals(Current goals can be found in the care plan section) Acute Rehab OT Goals Patient Stated Goal: none stated OT Goal Formulation: With patient Time For Goal Achievement: 09/03/2021 Potential to Achieve Goals: Good ADL Goals Pt Will Perform Eating: with set-up;sitting Pt Will Perform Grooming: with min guard assist;with supervision;sitting Pt Will Perform Upper Body Bathing: with min assist;with min guard assist;sitting Pt Will Perform Upper Body Dressing: with min assist;with min guard  assist;sitting Pt Will Transfer to Toilet: with max assist;with mod assist;with +2 assist (lateral scoot to drop arm commode) Additional ADL Goal #1: Pt will complete bed mobility sup - sit max - mod A to sit EOB in prep for UB ADLs  OT Frequency: Min 2X/week   Barriers to D/C:            Co-evaluation PT/OT/SLP Co-Evaluation/Treatment: Yes Reason for Co-Treatment: Complexity of the patient's impairments (multi-system involvement);For patient/therapist safety;To address functional/ADL transfers   OT goals addressed during session: ADL's and self-care      AM-PAC OT "6 Clicks" Daily Activity     Outcome Measure Help from another person eating meals?: A Little Help from another person taking care of personal grooming?: A Little Help from another person toileting, which includes using toliet, bedpan, or urinal?: Total Help from another person bathing (including washing, rinsing, drying)?: A Lot Help from another person to put on and taking off regular upper body clothing?: A Lot Help from another person to put on and taking off regular lower body clothing?: Total 6 Click Score: 12   End of Session    Activity Tolerance: Patient limited by fatigue Patient left: in bed;with call bell/phone within reach;with bed alarm set  OT Visit Diagnosis: Other abnormalities of gait and mobility (R26.89);Other symptoms and signs involving cognitive function;Muscle weakness (generalized) (M62.81)                Time: 8527-7824 OT Time Calculation (min): 28 min Charges:  OT General Charges $OT Visit: 1 Visit OT Evaluation $OT Eval Moderate Complexity: 1 Mod    Britt [place] 08/21/2021, 10:54 AM

## 2021-08-21 NOTE — TOC Initial Note (Signed)
Transition of Care [facility]) - Initial/Assessment Note    Patient Details  Name: Carolyn Valenzuela MRN: [id number] Date of Birth: 30-May-1951  Transition of Care [facility]) CM/SW Contact:    Carolyn Valenzuela, [place] Phone Number: 08/21/2021, 12:47 PM  Clinical Narrative:                  CSW called pt sister Carolyn Valenzuela to discuss SNF. Carolyn Valenzuela states that pt lives in apartment with pt's niece. When niece is at work, Carolyn Valenzuela stay's with pt since she is retired. CSW discussed possible plan for SNF. Carolyn Valenzuela states that pt was just in [facility] 2-3 weeks ago and was there for 20 days. CSW explained insurance processes and possibility of pt being in medicare copay days; Carolyn Valenzuela thinks pt may have an additional insurance plan that she will clarify. Carolyn Valenzuela explains that there is meeting with family and palliative at 11am tomorrow to discuss possibility of pt going home with hospice. Family will attend palliative meeting prior to deciding on disposition. TOC will continue to follow.   Expected Discharge Plan: [place] (SNF vs Home with Hospice?) Barriers to Discharge: Continued Medical Work up   Patient Goals and CMS Choice Patient states their goals for this hospitalization and ongoing recovery are:: Patient states she lives with her niece.      Expected Discharge Plan and Services Expected Discharge Plan: [place] (SNF vs Home with Hospice?) In-house Referral: NA Discharge Planning Services: CM Consult Post Acute Care Choice: Home Health, Resumption of Svcs/PTA Provider Living arrangements for the past 2 months: Apartment                 DME Arranged: N/A DME Agency: NA       HH Arranged: RN, Disease Management, PT, OT, Nurse's Aide, Social Work [employer] Agency: [place] Date HH Agency Contacted: 08/20/21 Time HH Agency Contacted: 46 Representative spoke with at [place]: [place] Arrangements/Services Living arrangements for the past 2 months:  [place] with:: Self, Relatives Patient language and need for interpreter reviewed:: Yes Do you feel safe going back to the place where you live?: Yes      Need for Family Participation in Patient Care: Yes (Comment) Care giver support system in place?: Yes (comment) Current home services: DME (Patient has a rollator per Epic notes.) Criminal Activity/Legal Involvement Pertinent to Current Situation/Hospitalization: No - Comment as needed  Activities of Daily Living Home Assistive Devices/Equipment: Prosthesis (bilateral prosthetic legs) ADL Screening (condition at time of admission) Patient's cognitive ability adequate to safely complete daily activities?: No Is the patient deaf or have difficulty hearing?: No Does the patient have difficulty seeing, even when wearing glasses/contacts?: No Does the patient have difficulty concentrating, remembering, or making decisions?: Yes Patient able to express need for assistance with ADLs?: No Does the patient have difficulty dressing or bathing?: Yes Independently performs ADLs?: No Communication: Needs assistance Is this a change from baseline?: Change from baseline, expected to last >3 days Dressing (OT): Needs assistance Is this a change from baseline?: Change from baseline, expected to last >3 days Grooming: Needs assistance Is this a change from baseline?: Change from baseline, expected to last >3 days Feeding: Needs assistance Is this a change from baseline?: Change from baseline, expected to last >3 days Bathing: Needs assistance Is this a change from baseline?: Change from baseline, expected to last >3 days Toileting: Needs assistance Is this a change from baseline?: Change from baseline, expected to last >  3days In/Out Bed: Needs assistance Is this a change from baseline?: Change from baseline, expected to last >3 days Walks in Home: Needs assistance Is this a change from baseline?: Change from baseline, expected to last >3  days Does the patient have difficulty walking or climbing stairs?: Yes Weakness of Legs: Both Weakness of Arms/Hands: Both  Permission Sought/Granted Permission sought to share information with : Case Manager, Customer service manager, Family Supports       Permission granted to share info w AGENCY: [facility]        Emotional Assessment Appearance:: Appears stated age Attitude/Demeanor/Rapport: Unable to Assess Affect (typically observed): Unable to Assess Orientation: : Oriented to Self, Oriented to Place Alcohol / Substance Use: Not Applicable Psych Involvement: No (comment)  Admission diagnosis:  CAP (community acquired pneumonia) [J18.9] Generalized weakness [R53.1] Sepsis due to pneumonia ([place]) [J18.9, A41.9] Patient Active Problem List   Diagnosis Date Noted   FTT (failure to thrive) in adult 08/20/2021   Pressure injury of skin 08/20/2021   Sepsis due to pneumonia ([place]) 08/19/2021   Severe protein-calorie malnutrition ([place]) 08/19/2021   Anemia in ESRD (end-stage renal disease) ([place]) 08/19/2021   Hyperbilirubinemia 08/19/2021   Physical deconditioning 06/29/2021   Cholelithiases 06/26/2021   Choledocholithiasis 06/25/2021   Lump of right breast 02/16/2021   Gait disorder 02/16/2021   Finger pain 02/16/2021   Right shoulder pain 02/16/2021   Other disorders of plasma-protein metabolism, not elsewhere classified 01/13/2021   Encounter for screening for COVID-19 04/22/2020   Allergy, unspecified, initial encounter 03/28/2020   Anaphylactic shock, unspecified, initial encounter 03/28/2020   Chronic low back pain 03/12/2020   Closed displaced fracture of shaft of fifth metacarpal bone of left hand 04/25/2019   Left hip pain 11/18/2017   GERD (gastroesophageal reflux disease) 10/10/2017   Bloating 10/10/2017   PVD (peripheral vascular disease) ([place])    Phantom pain after amputation of lower extremity ([place]) 01/05/2017   Encounter for removal of sutures  12/01/2016   Abnormal CXR 07/07/2016   Pneumonia, unspecified organism 07/07/2016   Hypotension    NSTEMI (non-ST elevated myocardial infarction) ([place])    Encounter for immunization 05/27/2016   Nausea and vomiting 03/18/2016   Hypokalemia 01/31/2016   Sepsis ([place]) due to UTI 12/20/2015   Acute lower UTI    Labile blood pressure    SIRS (systemic inflammatory response syndrome) ([place])    Hypoglycemia associated with diabetes ([place])    Urinary retention    Dysphagia    S/P bilateral BKA (below knee amputation) ([place])    Abnormality of gait    Muscle spasm    ESRD on dialysis ([place])    Type 2 diabetes mellitus with diabetic peripheral angiopathy and gangrene, without long-term current use of insulin (HCC)    Post-operative pain    Metabolic bone disease    Hypoxia    Type 2 diabetes mellitus with peripheral neuropathy ([place])    Labile blood glucose    Postoperative pain    Acute blood loss anemia    Anemia of chronic disease    Below knee amputation status 11/29/2015   Odynophagia 11/12/2015   Unspecified disorder of calcium metabolism 07/31/2015   Chest pain, atypical 06/06/2015   Ingrown nail 05/28/2015   Other fluid overload 02/15/2015   Shortness of breath 12/13/2014   Diarrhea, unspecified 12/13/2014   Pain, unspecified 12/13/2014   Pruritus, unspecified 12/13/2014   Unspecified protein-calorie malnutrition ([place]) 10/17/2014   Acute pulmonary edema (HCC)    FUO (fever  of unknown origin)    Complication of vascular dialysis catheter 09/10/2014   Fever, unspecified 09/10/2014   HCAP (healthcare-associated pneumonia)    Coagulation defect, unspecified ([place]) 09/06/2014   Radiculopathy, cervical region 09/06/2014   End stage renal disease ([place]) 09/06/2014   Essential (primary) hypertension 09/06/2014   Hyperlipidemia, unspecified 09/06/2014   Encounter for screening for respiratory tuberculosis 09/06/2014   Secondary hyperparathyroidism of renal origin ([place]) 09/06/2014    Anemia in chronic kidney disease 09/06/2014   Type 2 diabetes mellitus with ESRD (end-stage renal disease) ([place]) 09/01/2014   Anemia of renal disease 09/01/2014   Venous insufficiency of left leg 08/27/2014   Rash 07/06/2014   Diabetic wet gangrene of the foot - Right 03/15/2014   Critical lower limb ischemia ([place]) 01/09/2014   Right foot ulcer ([place]) 11/07/2013   Toe pain 09/06/2013   Pre-ulcerative corn or callous 09/06/2013   Lower extremity pain 09/06/2013   Low back pain 12/08/2011   Hip bursitis, left 12/08/2011   Left leg pain 12/08/2011   Abdominal pain 07/08/2011   Encounter for well adult exam with abnormal findings 12/08/2010   Allergic rhinitis, cause unspecified 12/08/2010   Type 2 diabetes mellitus with diabetic chronic kidney disease ([place]) 05/21/2009   SECONDARY HYPERPARATHYROIDISM 05/21/2009   NUMBNESS 05/21/2009   BACK PAIN 05/02/2009   ANEMIA-IRON DEFICIENCY 01/25/2008   Essential hypertension 01/25/2008   Brachial neuritis or radiculitis 01/25/2008   Hyperlipidemia 03/30/2007   PCP:  Biagio Borg, MD Pharmacy:   [id number] [address], [place] [address] Phone: [phone] Fax: [phone]     Social Determinants of Health (SDOH) Interventions    Readmission Risk Interventions No flowsheet data found.

## 2021-08-21 NOTE — Progress Notes (Signed)
PROGRESS NOTE  Carolyn Valenzuela  FOY:[id number] DOB: 08-17-1951 DOA: 08/19/2021 PCP: Biagio Borg, MD   Brief Narrative: Carolyn Valenzuela is a 70 y.o. female with medical history significant of seasonal allergies, iron deficiency anemia, arctic regurgitation, CAD, left cervical radiculopathy, history of critical lower limb ischemia, bilateral BKA, uncontrolled type II DM, esophagitis, ESRD on HD, history of HCAP, hyperlipidemia, hypertension, ischemic cardiomyopathy, mitral regurgitation, peripheral vascular disease, secondary hyperparathyroidism who is sent by her PCP for evaluation and admission to a nursing home.  She also has a wound on her left stump.  She has had decreased appetite and weight loss.  Her generalized weakness has become worse.  She has been home for a few weeks after being discharged to [facility] rehab where she spent about 20 days after her last admission in October.   She was afebrile with leukocytosis of 17.8k, lactic acidosis (lactate 3.2), with CXR revealing new bilateral upper lobe pneumonia with increased small bilateral pleural effusion. IV antibiotics were given and the patient was admitted  Assessment & Plan: Principal Problem:   Sepsis due to pneumonia [facility]) Active Problems:   Type 2 diabetes mellitus with diabetic chronic kidney disease ([place])   Hyperlipidemia   Essential hypertension   ESRD on dialysis ([place])   GERD (gastroesophageal reflux disease)   Severe protein-calorie malnutrition ([place])   Anemia in ESRD (end-stage renal disease) ([place])   Hyperbilirubinemia   Pressure injury of skin  Sepsis with lactic acidosis due to bilateral pneumonia: Covid, flu negative.  - Continue empiric ceftriaxone, azithromycin. WBC normalized. Lactate elevation improving. Cultures NGTD.   HTN: Hold antihypertensives currently due to soft BP.  ESRD:  - Nephrology consulted for routine HD.  - Anticipate sarcopenia will result in lowering of EDW.   Anemia of ESRD: Recently  received ESA. Drop in hgb since admission in setting of large volume IVF resuscitation and drop in all cell lines has stabilized. No gross bleeding noted.  - To avoid iatrogenic anemia, will limit blood draws going forward.  T2DM with peripheral neuropathy: HbA1c 5.4% (not on meds at home) - Continue SSI, gabapentin. At inpatient goal   Failure to thrive: Note 20 lbs weight loss and still has swelling in LE's, so suspect muscle mass loss. Debility has progressed to limiting her ability to remain safely at home despite 24 hour assistance with family.  - PT/OT consulted - TOC consulted - Palliative care consulted. Family meeting planned 12/23. D/w Tacey Ruiz, NP whose assistance is appreciated. Pt is DNR confirmed. Disposition (SNF for rehabilitation goal vs. home with hospice) to be discussed among others.  Severe protein-calorie malnutrition:  - Supplement protein as able. Albumin undetectably low (<1.5).   DVT prophylaxis: Lovenox Code Status: Full Family Communication: None at bedside, unable to reach by phone Disposition Plan:  Status is: Inpatient  Remains inpatient appropriate because: Unsafe DC.  Consultants:  Nephrology Palliative care  Procedures:  HD (routine) 12/21  Antimicrobials: Ceftriaxone, azithromycin   Subjective: No complaints, feels some pain in her legs most of the time. No chest pain or dyspnea or fever.   Objective: Vitals:   08/20/21 1953 08/21/21 0019 08/21/21 0419 08/21/21 0519  BP: (!) 103/51 (!) 103/51 (!) 115/56 (!) 112/51  Pulse: 67 62 81 79  Resp: 19 20 18 16   Temp: 98.9 F (37.2 C) 98.7 F (37.1 C) 98.1 F (36.7 C)   TempSrc: Oral Oral Oral   SpO2: (!) 89% 90% 95% 95%  Weight:  Height:        Intake/Output Summary (Last 24 hours) at 08/21/2021 1633 Last data filed at 08/21/2021 1500 Gross per 24 hour  Intake 700 ml  Output --  Net 700 ml   Filed Weights   08/20/21 0039 08/20/21 0945 08/20/21 1340  Weight: 50.8 kg  52.1 kg 50.8 kg   Gen: Chronically ill-appearing female in no distress Pulm: Nonlabored breathing supplemental oxygen, Clear anterolaterally. CV: Regular rate and rhythm. Soft systolic murmur, no rub, or gallop. No JVD GI: Abdomen soft, non-tender, non-distended, with normoactive bowel sounds.  Ext: Warm, bilateral BKAs without infection at stump sites. Woody edema dependently bilaterally without visible or palpable erythema/fluctuance or exudate. AVF + thrill Skin: No other rashes, lesions or ulcers on visualized skin.  Neuro: Alert and incompletely oriented without focal neurological deficits. Psych: Judgement and insight appear fair. Mood euthymic & affect congruent. Behavior is appropriate.    Data Reviewed: I have personally reviewed following labs and imaging studies  CBC: Recent Labs  Lab 08/19/21 1258 08/20/21 0126 08/20/21 1521  WBC 17.8* 9.3  --   NEUTROABS 14.7* 7.3  --   HGB 11.2* 7.8* 9.6*  HCT 33.5* 22.2* 27.0*  MCV 91.0 89.2  --   PLT 173 117*  --    Basic Metabolic Panel: Recent Labs  Lab 08/19/21 1258 08/20/21 0126 08/21/21 0404  NA 130* 131* 129*  K 3.6 3.6 4.1  CL 95* 100 98  CO2 24 19* 24  GLUCOSE 299* 147* 100*  BUN 13 12 8   CREATININE 3.12* 2.56* 2.26*  CALCIUM 8.1* 7.3* 7.4*  PHOS  --   --  1.4*   GFR: Estimated Creatinine Clearance: 16.9 mL/min (A) (by C-G formula based on SCr of 2.26 mg/dL (H)). Liver Function Tests: Recent Labs  Lab 08/19/21 1258 08/20/21 0126 08/21/21 0404  AST 24 16  --   ALT 15 12  --   ALKPHOS 92 57  --   BILITOT 1.8* 0.8  --   PROT 6.1* 3.7*  --   ALBUMIN 2.2* <1.5* 1.8*   No results for input(s): LIPASE, AMYLASE in the last 168 hours. Recent Labs  Lab 08/19/21 1259  AMMONIA 25   Coagulation Profile: Recent Labs  Lab 08/20/21 0126  INR 2.1*   Cardiac Enzymes: No results for input(s): CKTOTAL, CKMB, CKMBINDEX, TROPONINI in the last 168 hours. BNP (last 3 results) No results for input(s): PROBNP in  the last 8760 hours. HbA1C: No results for input(s): HGBA1C in the last 72 hours. CBG: Recent Labs  Lab 08/20/21 1635 08/20/21 2123 08/21/21 0739 08/21/21 1135 08/21/21 1612  GLUCAP 97 123* 111* 113* 132*   Lipid Profile: No results for input(s): CHOL, HDL, LDLCALC, TRIG, CHOLHDL, LDLDIRECT in the last 72 hours. Thyroid Function Tests: No results for input(s): TSH, T4TOTAL, FREET4, T3FREE, THYROIDAB in the last 72 hours. Anemia Panel: No results for input(s): VITAMINB12, FOLATE, FERRITIN, TIBC, IRON, RETICCTPCT in the last 72 hours. Urine analysis:    Component Value Date/Time   COLORURINE YELLOW 09/15/2016 1300   APPEARANCEUR CLEAR 09/15/2016 1300   LABSPEC 1.010 09/15/2016 1300   PHURINE 7.5 09/15/2016 1300   GLUCOSEU 250 (A) 09/15/2016 1300   HGBUR SMALL (A) 09/15/2016 1300   BILIRUBINUR NEGATIVE 09/15/2016 1300   KETONESUR NEGATIVE 09/15/2016 1300   PROTEINUR >300 (A) 03/19/2016 0206   UROBILINOGEN 0.2 09/15/2016 1300   NITRITE NEGATIVE 09/15/2016 1300   LEUKOCYTESUR NEGATIVE 09/15/2016 1300   Recent Results (from the past 240 hour(s))  Resp Panel by RT-PCR (Flu A&B, Covid) Nasopharyngeal Swab     Status: None   Collection Time: 08/19/21  2:00 PM   Specimen: Nasopharyngeal Swab; Nasopharyngeal(NP) swabs in vial transport medium  Result Value Ref Range Status   SARS Coronavirus 2 by RT PCR NEGATIVE NEGATIVE Final    Comment: (NOTE) SARS-CoV-2 target nucleic acids are NOT DETECTED.  The SARS-CoV-2 RNA is generally detectable in upper respiratory specimens during the acute phase of infection. The lowest concentration of SARS-CoV-2 viral copies this assay can detect is 138 copies/mL. A negative result does not preclude SARS-Cov-2 infection and should not be used as the sole basis for treatment or other patient management decisions. A negative result may occur with  improper specimen collection/handling, submission of specimen other than nasopharyngeal swab, presence  of viral mutation(s) within the areas targeted by this assay, and inadequate number of viral copies(<138 copies/mL). A negative result must be combined with clinical observations, patient history, and epidemiological information. The expected result is Negative.  Fact Sheet for Patients:  EntrepreneurPulse.com.au  Fact Sheet for Healthcare Providers:  IncredibleEmployment.be  This test is no t yet approved or cleared by the [place] FDA and  has been authorized for detection and/or diagnosis of SARS-CoV-2 by FDA under an Emergency Use Authorization (EUA). This EUA will remain  in effect (meaning this test can be used) for the duration of the COVID-19 declaration under Section 564(b)(1) of the Act, 21 U.S.C.section 360bbb-3(b)(1), unless the authorization is terminated  or revoked sooner.       Influenza A by PCR NEGATIVE NEGATIVE Final   Influenza B by PCR NEGATIVE NEGATIVE Final    Comment: (NOTE) The Xpert Xpress SARS-CoV-2/FLU/RSV plus assay is intended as an aid in the diagnosis of influenza from Nasopharyngeal swab specimens and should not be used as a sole basis for treatment. Nasal washings and aspirates are unacceptable for Xpert Xpress SARS-CoV-2/FLU/RSV testing.  Fact Sheet for Patients: EntrepreneurPulse.com.au  Fact Sheet for Healthcare Providers: IncredibleEmployment.be  This test is not yet approved or cleared by the [place] FDA and has been authorized for detection and/or diagnosis of SARS-CoV-2 by FDA under an Emergency Use Authorization (EUA). This EUA will remain in effect (meaning this test can be used) for the duration of the COVID-19 declaration under Section 564(b)(1) of the Act, 21 U.S.C. section 360bbb-3(b)(1), unless the authorization is terminated or revoked.  Performed at [facility], [place] [address] [place], [place] [address]   Blood culture  (routine x 2)     Status: None (Preliminary result)   Collection Time: 08/19/21  2:47 PM   Specimen: BLOOD  Result Value Ref Range Status   Specimen Description   Final    BLOOD BLOOD RIGHT WRIST Performed at [facility] [address] [place], [place] [address]    Special Requests   Final    BOTTLES DRAWN AEROBIC AND ANAEROBIC Blood Culture results may not be optimal due to an inadequate volume of blood received in culture bottles Performed at [facility] [address], [place], [place] [address]    Culture   Final    NO GROWTH 2 DAYS Performed at [place] [address], [place],  [address]    Report Status PENDING  Incomplete  Blood culture (routine x 2)     Status: None (Preliminary result)   Collection Time: 08/20/21  1:25 AM   Specimen: BLOOD RIGHT HAND  Result Value Ref Range  Status   Specimen Description BLOOD RIGHT HAND  Final   Special Requests   Final    AEROBIC BOTTLE ONLY Blood Culture results may not be optimal due to an inadequate volume of blood received in culture bottles   Culture   Final    NO GROWTH 1 DAY Performed at [facility], [place] [address], [place], [place] [address]    Report Status PENDING  Incomplete      Radiology Studies: No results found.  Scheduled Meds:  (feeding supplement) PROSource Plus  30 mL Oral TID BM   aspirin EC  81 mg Oral Daily   brimonidine  1 drop Both Eyes TID   cinacalcet  30 mg Oral Q M,W,F-1800   clopidogrel  75 mg Oral Daily   enoxaparin (LOVENOX) injection  30 mg Subcutaneous Q24H   feeding supplement (NEPRO CARB STEADY)  237 mL Oral BID BM   gabapentin  200 mg Oral TID   insulin aspart  0-6 Units Subcutaneous TID WC   pantoprazole  40 mg Oral Daily   rosuvastatin  10 mg Oral Daily   Continuous Infusions:  sodium chloride     sodium chloride     azithromycin Stopped (08/21/21 1239)   cefTRIAXone (ROCEPHIN)  IV Stopped (08/21/21 1125)     LOS:  2 days   Time spent: 25 minutes.  Patrecia Pour, MD [facility] [email or web address] 08/21/2021, 4:33 PM

## 2021-08-21 NOTE — Progress Notes (Signed)
Pt receives out-pt HD at [facility] on MWF. Pt arrives at 6:10 for 6:25 chair time.. Will assist as needed.  Melven Sartorius Renal Navigator [phone]

## 2021-08-22 DIAGNOSIS — E7800 Pure hypercholesterolemia, unspecified: Secondary | ICD-10-CM

## 2021-08-22 DIAGNOSIS — E1122 Type 2 diabetes mellitus with diabetic chronic kidney disease: Secondary | ICD-10-CM

## 2021-08-22 DIAGNOSIS — E43 Unspecified severe protein-calorie malnutrition: Secondary | ICD-10-CM

## 2021-08-22 LAB — RENAL FUNCTION PANEL
Albumin: 1.5 g/dL — ABNORMAL LOW (ref 3.5–5.0)
Anion gap: 5 (ref 5–15)
BUN: 19 mg/dL (ref 8–23)
CO2: 29 mmol/L (ref 22–32)
Calcium: 7.9 mg/dL — ABNORMAL LOW (ref 8.9–10.3)
Chloride: 99 mmol/L (ref 98–111)
Creatinine, Ser: 2.93 mg/dL — ABNORMAL HIGH (ref 0.44–1.00)
GFR, Estimated: 17 mL/min — ABNORMAL LOW (ref >60)
Glucose, Bld: 245 mg/dL — ABNORMAL HIGH (ref 70–99)
Phosphorus: 1.5 mg/dL — ABNORMAL LOW (ref 2.5–4.6)
Potassium: 3.5 mmol/L (ref 3.5–5.1)
Sodium: 133 mmol/L — ABNORMAL LOW (ref 135–145)

## 2021-08-22 LAB — CBC
HCT: 25.9 % — ABNORMAL LOW (ref 36.0–46.0)
Hemoglobin: 8.9 g/dL — ABNORMAL LOW (ref 12.0–15.0)
MCH: 29.7 pg (ref 26.0–34.0)
MCHC: 34.4 g/dL (ref 30.0–36.0)
MCV: 86.3 fL (ref 80.0–100.0)
Platelets: 156 10*3/uL (ref 150–400)
RBC: 3 MIL/uL — ABNORMAL LOW (ref 3.87–5.11)
RDW: 18.5 % — ABNORMAL HIGH (ref 11.5–15.5)
WBC: 10.5 10*3/uL (ref 4.0–10.5)
nRBC: 0.4 % — ABNORMAL HIGH (ref 0.0–0.2)

## 2021-08-22 LAB — GLUCOSE, CAPILLARY: Glucose-Capillary: 199 mg/dL — ABNORMAL HIGH (ref 70–99)

## 2021-08-22 MED ORDER — AZITHROMYCIN 250 MG PO TABS: 500.0000 mg | ORAL_TABLET | Freq: Every day | ORAL | Status: DC

## 2021-08-22 MED ORDER — POLYVINYL ALCOHOL 1.4 % OP SOLN
1.0000 [drp] | Freq: Four times a day (QID) | OPHTHALMIC | Status: DC | PRN
  Filled 2021-08-22: qty 15

## 2021-08-22 MED ORDER — LORAZEPAM 2 MG/ML IJ SOLN: 0.5000 mg | INTRAMUSCULAR | Status: DC | PRN

## 2021-08-22 MED ORDER — GLYCOPYRROLATE 1 MG PO TABS
1.0000 mg | ORAL_TABLET | ORAL | Status: DC | PRN
  Filled 2021-08-22: qty 1

## 2021-08-22 MED ORDER — GLYCOPYRROLATE 0.2 MG/ML IJ SOLN: 0.2000 mg | INTRAMUSCULAR | Status: DC | PRN

## 2021-08-22 MED ORDER — LORAZEPAM 2 MG/ML IJ SOLN
1.0000 mg | Freq: Three times a day (TID) | INTRAMUSCULAR | Status: DC
  Administered 2021-08-22: 14:00:00 1 mg via INTRAVENOUS
  Filled 2021-08-22: qty 1

## 2021-08-22 MED ORDER — BIOTENE DRY MOUTH MT LIQD: 15.0000 mL | OROMUCOSAL | Status: DC | PRN

## 2021-08-22 MED ORDER — HALOPERIDOL LACTATE 2 MG/ML PO CONC
0.5000 mg | ORAL | Status: DC | PRN
  Filled 2021-08-22: qty 0.3

## 2021-08-22 MED ORDER — HYDROMORPHONE HCL 1 MG/ML IJ SOLN: 0.5000 mg | Freq: Three times a day (TID) | INTRAMUSCULAR | Status: DC

## 2021-08-22 MED ORDER — HALOPERIDOL LACTATE 5 MG/ML IJ SOLN: 0.5000 mg | INTRAMUSCULAR | Status: DC | PRN

## 2021-08-22 MED ORDER — HYDROMORPHONE HCL 1 MG/ML IJ SOLN: 1.0000 mg | INTRAMUSCULAR | Status: DC | PRN

## 2021-08-22 MED ORDER — HALOPERIDOL 0.5 MG PO TABS
0.5000 mg | ORAL_TABLET | ORAL | Status: DC | PRN
  Filled 2021-08-22: qty 1

## 2021-08-22 NOTE — Progress Notes (Signed)
Manufacturing engineer [facility]) Hospital Liaison note.     [place] consents are complete.  RN please call report to [phone].   Thank you,     Farrel Gordon, RN, [place] Hospital Liaison  [phone]

## 2021-08-22 NOTE — Progress Notes (Signed)
Nutrition Brief Note  Chart reviewed due to MST score of 3. Palliative Care Team meeting with patient's family occurred earlier today. Plans to transition to comfort care with discharge to residential hospice. Patient will no longer receive HD.  No nutrition interventions planned at this time.  Please consult as needed.   Lucas Mallow, RD, LDN, CNSC Please refer to [facility] for contact information.

## 2021-08-22 NOTE — Discharge Summary (Signed)
Physician Discharge Summary  Carolyn Valenzuela BMW:[id number] DOB: February 23, 1951 DOA: 08/19/2021  PCP: Biagio Borg, MD  Admit date: 08/19/2021 Discharge date: 08/22/2021  Admitted From: Home Disposition: Hospice   Recommendations for Outpatient Follow-up:  Comfort measures  Home Health: NA Equipment/Devices: NA Discharge Condition: Stable for transfer, guarded prognosis CODE STATUS: DNR  Brief/Interim Summary: Carolyn Valenzuela is a 70 y.o. female with medical history significant of seasonal allergies, iron deficiency anemia, arctic regurgitation, CAD, left cervical radiculopathy, history of critical lower limb ischemia, bilateral BKA, uncontrolled type II DM, esophagitis, ESRD on HD, history of HCAP, hyperlipidemia, hypertension, ischemic cardiomyopathy, mitral regurgitation, peripheral vascular disease, secondary hyperparathyroidism who is sent by her PCP for evaluation and admission to a nursing home.  She also has a wound on her left stump.  She has had decreased appetite and weight loss.  Her generalized weakness has become worse.  She has been home for a few weeks after being discharged to [facility] rehab where she spent about 20 days after her last admission in October.    She was afebrile with leukocytosis of 17.8k, lactic acidosis (lactate 3.2), with CXR revealing new bilateral upper lobe pneumonia with increased small bilateral pleural effusion. IV antibiotics were given and the patient was admitted. She has defervesced and clinically stabilized. Palliative care was consulted given her acute on chronic functional/clinical decline. The patient and family have opted to forego continued dialysis and other aggressive measures and to focus on comfort measures. She will be discharged to residential hospice.  Discharge Diagnoses:  Principal Problem:   Sepsis due to pneumonia [facility]) Active Problems:   Type 2 diabetes mellitus with diabetic chronic kidney disease ([place])   Hyperlipidemia    Essential hypertension   ESRD on dialysis ([place])   GERD (gastroesophageal reflux disease)   Severe protein-calorie malnutrition ([place])   Anemia in ESRD (end-stage renal disease) ([place])   Hyperbilirubinemia   Pressure injury of skin  Bilateral pneumonia: Covid, flu negative. Sepsis technically is ruled out based on SIRS criteria.  - Completed Tx with ceftriaxone, azithromycin. WBC normalized. Lactate elevation improving. Cultures NGTD.    HTN: Hold antihypertensives currently due to soft BP.   ESRD:  - Nephrology consulted for routine HD. Will step away now that we're on comfort measures path.     Anemia of ESRD: Recently received ESA. Drop in hgb since admission in setting of large volume IVF resuscitation and drop in all cell lines has stabilized. No gross bleeding noted.  - To avoid iatrogenic anemia, will limit blood draws going forward.   T2DM with peripheral neuropathy: HbA1c 5.4% (not on meds at home) - Continue SSI, gabapentin. At inpatient goal    Failure to thrive: Note 20 lbs weight loss and still has swelling in LE's, so suspect muscle mass loss. Debility has progressed to limiting her ability to remain safely at home despite 24 hour assistance with family.  - Palliative care consulted. Family meeting on 12/23, will transition to comfort measures only, wants to stop dialysis. Residential hospice placement is sought. Continue only medications for pain, nausea, anxiety, agitation, etc.   - Their note is copied below:  "A family meeting was held this afternoon with patient's sisters Carolyn Valenzuela, Carolyn Valenzuela, Carolyn Valenzuela, Carolyn Valenzuela, Carolyn Valenzuela, and Carolyn Valenzuela.   Providers present were Carolyn Valenzuela, Carolyn Valenzuela.   A careful review of patient's chronic comorbid conditions was had inclusive of coronary artery disease, peripheral vascular disease resulting in bilateral below the knee amputations, poorly  controlled type 2 diabetes, end-stage renal disease requiring dialysis  3 days a week, and systolic heart failure.   A discussion of patient's albumin level of 1.8 which shared in the context of conversation regarding failure to thrive.  We reviewed that often failure to thrive is seen when patients are encroaching upon their final phases of life.   We reviewed that over the past 3 months Carolyn Valenzuela has made a variety of comments with her family which indicate that she is nearing the end of life.  She has consistently shared that she "wants to be with her mother".  She shared that wound she wants to go "home".  She shared that she "needs to give everyone their Christmas presents as she will not be here this year for Christmas".  She is also shared with her nephew that she does not think she will be around during the holiday season next year.   In the past 3 weeks it appears Carolyn Valenzuela's decline has been the most marked whereby she has remained to be encephalopathic fairly consistently in conjunction with her failure to thrive.  Per her Carolyn Valenzuela who had spoken with the dialysis center physicians it was identified that she is no longer benefiting from dialysis as they are not able to complete full courses of treatment due to patient's intolerance.  Carolyn Valenzuela and Carolyn Valenzuela like agree that Carolyn Valenzuela is by no measure thriving at this point in time and if anything is declaring that her life will be short.   A conversation was held in regards to whether or not we continue the present modalities of care or if we transition her focus to comfort oriented care. We talked about transition to comfort measures in house and what that would entail inclusive of medications to control pain, dyspnea, agitation, nausea, itching, and hiccups.   We discussed stopping all uneccessary measures such as telemetry monitoring, supplemental oxygen, antibiotics, blood draws, needle sticks, and frequent vital signs.      Patient's sisters both expressed that Carolyn Valenzuela is now at a point where they believe she would no  longer want invasive medical treatment.  They are of the mindset that we should now stop dialysis and alleviate any suffering that she may be experiencing.   A conversation regarding where end-of-life care should take place and after going back and forth amongst family members it was decided that Mildred being in an inpatient hospice home would be the best next step.  In the meanwhile patient's family consents to initiating low-dose medications to aid in relief of any troubling symptoms.   Patient's primary medical team and nephrology team informed of this change.   Patient's nurse informed of this change in care.   Comfort orders have been placed."   Severe protein-calorie malnutrition:  Albumin undetectably low (<1.5).    Stage 2 pressure injury to right BKA stump, POA: Offload as able.   Discharge Instructions  Allergies as of 08/22/2021       Reactions   Oxycodone Itching        Medication List     STOP taking these medications    amLODipine 10 MG tablet Commonly known as: NORVASC   aspirin 325 MG EC tablet   aspirin EC 81 MG tablet   cinacalcet 30 MG tablet Commonly known as: SENSIPAR   clopidogrel 75 MG tablet Commonly known as: PLAVIX   lidocaine-prilocaine cream Commonly known as: EMLA   loperamide 2 MG capsule Commonly known as: IMODIUM   multivitamin Tabs tablet  nebivolol 5 MG tablet Commonly known as: Bystolic   ondansetron 4 MG tablet Commonly known as: ZOFRAN   psyllium 95 % Pack Commonly known as: HYDROCIL/METAMUCIL   rosuvastatin 40 MG tablet Commonly known as: CRESTOR       TAKE these medications    acetaminophen 500 MG tablet Commonly known as: TYLENOL Take 1,000 mg by mouth every 6 (six) hours as needed for mild pain.   albuterol 108 (90 Base) MCG/ACT inhaler Commonly known as: VENTOLIN HFA Inhale 2 puffs into the lungs every 6 (six) hours as needed for wheezing or shortness of breath.   Alphagan P 0.1 % Soln Generic drug:  brimonidine Place 1 drop into both eyes 3 (three) times daily.   gabapentin 100 MG capsule Commonly known as: NEURONTIN TAKE 2 CAPSULES BY MOUTH 3 TIMES A DAY   pantoprazole 40 MG tablet Commonly known as: PROTONIX TAKE 1 TABLET BY MOUTH TWICE A DAY What changed: when to take this   Restasis 0.05 % ophthalmic emulsion Generic drug: cycloSPORINE Place 1 drop into both eyes 2 (two) times daily.        Follow-up Information     Biagio Borg, MD Follow up.   Specialties: Internal Medicine, Radiology Contact information: [place] [address] [phone]                Allergies  Allergen Reactions   Oxycodone Itching    Consultations: Nephrology Palliative care  Procedures/Studies: DG Chest 2 View  Result Date: 08/19/2021 CLINICAL DATA:  Altered mental status. EXAM: CHEST - 2 VIEW COMPARISON:  CT chest and chest x-ray dated June 25, 2021. FINDINGS: Unchanged cardiomegaly. New patchy consolidation in the left greater than right upper lobes. Increased small bilateral pleural effusions with bibasilar atelectasis. No pneumothorax. No acute osseous abnormality. Unchanged left subclavian and axillary stents. IMPRESSION: 1. New bilateral upper lobe pneumonia. 2. Increased small bilateral pleural effusions. Electronically Signed   By: Titus Dubin M.D.   On: 08/19/2021 12:53   CT Head Wo Contrast  Result Date: 08/19/2021 CLINICAL DATA:  Altered mental status, nontraumatic (Ped 0-17y) EXAM: CT HEAD WITHOUT CONTRAST TECHNIQUE: Contiguous axial images were obtained from the base of the skull through the vertex without intravenous contrast. COMPARISON:  04/21/2019. FINDINGS: Brain: No evidence of acute infarction, hemorrhage, hydrocephalus, extra-axial collection or mass lesion/mass effect. Progressive atrophy and ex vacuo ventricular dilation. Patchy white matter hypoattenuation, nonspecific but compatible with chronic microvascular ischemic disease.  Vascular: No hyperdense vessel identified. Calcific intracranial atherosclerosis. Skull: No acute fracture. Sinuses/Orbits: Visualized sinuses are clear. Unremarkable visualized orbits. Other: Left mastoid effusion. IMPRESSION: 1. No evidence of acute intracranial abnormality. 2. Since 2020, progressive atrophy and ex vacuo ventricular dilation. 3. Left mastoid effusion Electronically Signed   By: Margaretha Sheffield M.D.   On: 08/19/2021 14:48    Subjective: Seen at dialysis. Feels tired/wiped out but without new complaints. Does not report leg pain.   Discharge Exam: Vitals:   08/22/21 1200 08/22/21 1400  BP: (!) 109/54   Pulse: 68 67  Resp: 17 16  Temp: (!) 97.2 F (36.2 C)   SpO2: 100% 95%   Gen: Chronically ill-appearing and frail female in no distress Pulm: Nonlabored breathing. Clear. CV: Regular rate and rhythm. Stable soft murmur without rub, or gallop. No JVD, woody pitting edema is stable. GI: Abdomen soft, non-tender, non-distended, with normoactive bowel sounds.  Ext: Warm, bilateral BKAs without infection at stump sites. Right BKA has shallow wound that is  dressed, c/d/i Skin: No other rashes, lesions or ulcers on visualized skin. Neuro: Alert and interactive without focal neurological deficits. Psych: Judgement and insight appear marginal. Mood euthymic & affect congruent. Behavior is appropriate.    Labs: BNP (last 3 results) Recent Labs    06/28/21 0433 06/29/21 0716  BNP >4,500.0* >7,782.4*   Basic Metabolic Panel: Recent Labs  Lab 08/19/21 1258 08/20/21 0126 08/21/21 0404 08/22/21 0128  NA 130* 131* 129* 133*  K 3.6 3.6 4.1 3.5  CL 95* 100 98 99  CO2 24 19* 24 29  GLUCOSE 299* 147* 100* 245*  BUN 13 12 8 19   CREATININE 3.12* 2.56* 2.26* 2.93*  CALCIUM 8.1* 7.3* 7.4* 7.9*  PHOS  --   --  1.4* 1.5*   Liver Function Tests: Recent Labs  Lab 08/19/21 1258 08/20/21 0126 08/21/21 0404 08/22/21 0128  AST 24 16  --   --   ALT 15 12  --   --   ALKPHOS  92 57  --   --   BILITOT 1.8* 0.8  --   --   PROT 6.1* 3.7*  --   --   ALBUMIN 2.2* <1.5* 1.8* 1.5*   No results for input(s): LIPASE, AMYLASE in the last 168 hours. Recent Labs  Lab 08/19/21 1259  AMMONIA 25   CBC: Recent Labs  Lab 08/19/21 1258 08/20/21 0126 08/20/21 1521 08/22/21 0128  WBC 17.8* 9.3  --  10.5  NEUTROABS 14.7* 7.3  --   --   HGB 11.2* 7.8* 9.6* 8.9*  HCT 33.5* 22.2* 27.0* 25.9*  MCV 91.0 89.2  --  86.3  PLT 173 117*  --  156   Cardiac Enzymes: No results for input(s): CKTOTAL, CKMB, CKMBINDEX, TROPONINI in the last 168 hours. BNP: Invalid input(s): POCBNP CBG: Recent Labs  Lab 08/21/21 0739 08/21/21 1135 08/21/21 1612 08/21/21 2157 08/22/21 0649  GLUCAP 111* 113* 132* 251* 199*   D-Dimer No results for input(s): DDIMER in the last 72 hours. Hgb A1c No results for input(s): HGBA1C in the last 72 hours. Lipid Profile No results for input(s): CHOL, HDL, LDLCALC, TRIG, CHOLHDL, LDLDIRECT in the last 72 hours. Thyroid function studies No results for input(s): TSH, T4TOTAL, T3FREE, THYROIDAB in the last 72 hours.  Invalid input(s): FREET3 Anemia work up No results for input(s): VITAMINB12, FOLATE, FERRITIN, TIBC, IRON, RETICCTPCT in the last 72 hours. Urinalysis    Component Value Date/Time   COLORURINE YELLOW 09/15/2016 1300   APPEARANCEUR CLEAR 09/15/2016 1300   LABSPEC 1.010 09/15/2016 1300   PHURINE 7.5 09/15/2016 1300   GLUCOSEU 250 (A) 09/15/2016 1300   HGBUR SMALL (A) 09/15/2016 1300   BILIRUBINUR NEGATIVE 09/15/2016 1300   KETONESUR NEGATIVE 09/15/2016 1300   PROTEINUR >300 (A) 03/19/2016 0206   UROBILINOGEN 0.2 09/15/2016 1300   NITRITE NEGATIVE 09/15/2016 1300   LEUKOCYTESUR NEGATIVE 09/15/2016 1300    Microbiology Recent Results (from the past 240 hour(s))  Resp Panel by RT-PCR (Flu A&B, Covid) Nasopharyngeal Swab     Status: None   Collection Time: 08/19/21  2:00 PM   Specimen: Nasopharyngeal Swab; Nasopharyngeal(NP)  swabs in vial transport medium  Result Value Ref Range Status   SARS Coronavirus 2 by RT PCR NEGATIVE NEGATIVE Final    Comment: (NOTE) SARS-CoV-2 target nucleic acids are NOT DETECTED.  The SARS-CoV-2 RNA is generally detectable in upper respiratory specimens during the acute phase of infection. The lowest concentration of SARS-CoV-2 viral copies this assay can detect is 138 copies/mL. A  negative result does not preclude SARS-Cov-2 infection and should not be used as the sole basis for treatment or other patient management decisions. A negative result may occur with  improper specimen collection/handling, submission of specimen other than nasopharyngeal swab, presence of viral mutation(s) within the areas targeted by this assay, and inadequate number of viral copies(<138 copies/mL). A negative result must be combined with clinical observations, patient history, and epidemiological information. The expected result is Negative.  Fact Sheet for Patients:  EntrepreneurPulse.com.au  Fact Sheet for Healthcare Providers:  IncredibleEmployment.be  This test is no t yet approved or cleared by the [place] FDA and  has been authorized for detection and/or diagnosis of SARS-CoV-2 by FDA under an Emergency Use Authorization (EUA). This EUA will remain  in effect (meaning this test can be used) for the duration of the COVID-19 declaration under Section 564(b)(1) of the Act, 21 U.S.C.section 360bbb-3(b)(1), unless the authorization is terminated  or revoked sooner.       Influenza A by PCR NEGATIVE NEGATIVE Final   Influenza B by PCR NEGATIVE NEGATIVE Final    Comment: (NOTE) The Xpert Xpress SARS-CoV-2/FLU/RSV plus assay is intended as an aid in the diagnosis of influenza from Nasopharyngeal swab specimens and should not be used as a sole basis for treatment. Nasal washings and aspirates are unacceptable for Xpert Xpress  SARS-CoV-2/FLU/RSV testing.  Fact Sheet for Patients: EntrepreneurPulse.com.au  Fact Sheet for Healthcare Providers: IncredibleEmployment.be  This test is not yet approved or cleared by the [place] FDA and has been authorized for detection and/or diagnosis of SARS-CoV-2 by FDA under an Emergency Use Authorization (EUA). This EUA will remain in effect (meaning this test can be used) for the duration of the COVID-19 declaration under Section 564(b)(1) of the Act, 21 U.S.C. section 360bbb-3(b)(1), unless the authorization is terminated or revoked.  Performed at [facility], [place] [address] [place], [place] [address]   Blood culture (routine x 2)     Status: None (Preliminary result)   Collection Time: 08/19/21  2:47 PM   Specimen: BLOOD  Result Value Ref Range Status   Specimen Description   Final    BLOOD BLOOD RIGHT WRIST Performed at [facility] [place] [address] [place], [place] [address]    Special Requests   Final    BOTTLES DRAWN AEROBIC AND ANAEROBIC Blood Culture results may not be optimal due to an inadequate volume of blood received in culture bottles Performed at [facility] [address], [place], [place] [address]    Culture   Final    NO GROWTH 3 DAYS Performed at [facility], [place] [address], [place], [place] [address]    Report Status PENDING  Incomplete  Blood culture (routine x 2)     Status: None (Preliminary result)   Collection Time: 08/20/21  1:25 AM   Specimen: BLOOD RIGHT HAND  Result Value Ref Range Status   Specimen Description BLOOD RIGHT HAND  Final   Special Requests   Final    AEROBIC BOTTLE ONLY Blood Culture results may not be optimal due to an inadequate volume of blood received in culture bottles   Culture   Final    NO GROWTH 2 DAYS Performed at [facility], [place] [address], [place], [place] [address]    Report Status  PENDING  Incomplete    Time coordinating discharge: Approximately 40 minutes  Patrecia Pour, MD  [facility] 08/22/2021, 3:25 PM

## 2021-08-22 NOTE — Progress Notes (Addendum)
Palliative Medicine Inpatient Follow Up Note   Consulting Provider: Patrecia Pour, MD   Reason for consult:   [place] Palliative Medicine Consult  Reason for Consult? FTT, malnutrition, debility. Assistance with family/pt discussions regarding goals/limits of care and code status would be appreciated. Anticipate palliative care following at SNF.    HPI:  Per intake H&P --> Carolyn Valenzuela is a 70 y.o. female with medical history significant of seasonal allergies, iron deficiency anemia, arctic regurgitation, CAD, left cervical radiculopathy, history of critical lower limb ischemia, bilateral BKA, uncontrolled type II DM, esophagitis, ESRD on HD, history of HCAP, hyperlipidemia, hypertension, ischemic cardiomyopathy, mitral regurgitation, peripheral vascular disease, secondary hyperparathyroidism who is sent by her PCP for evaluation and admission to a nursing home.  She also has a wound on her left stump.  She has had decreased appetite and weight loss.  Her generalized weakness has become worse.  She has been home for a few weeks after being discharged to [facility] rehab where she spent about 20 days after her last admission in October.  She is able to answer simple questions denied headache, chest pain, back or abdominal pain at this time.   Palliative care has been asked to get involved to address goals of care in the setting of multiple co-morbid conditions and increased frailty.   Today's Discussion (08/22/2021):  *Please note that this is a verbal dictation therefore any spelling or grammatical errors are due to the "[employer]" system interpretation.  A family meeting was held this afternoon with patient's sisters Carolyn Valenzuela, Carolyn Valenzuela, Carolyn Valenzuela, Carolyn Valenzuela, Carolyn Valenzuela, and Carolyn Valenzuela.  Providers present were Tacey Ruiz, [place].  A careful review of patient's chronic comorbid conditions was had inclusive of coronary artery  disease, peripheral vascular disease resulting in bilateral below the knee amputations, poorly controlled type 2 diabetes, end-stage renal disease requiring dialysis 3 days a week, and systolic heart failure.  A discussion of patient's albumin level of 1.8 which shared in the context of conversation regarding failure to thrive.  We reviewed that often failure to thrive is seen when patients are encroaching upon their final phases of life.  We reviewed that over the past 3 months Carolyn Valenzuela has made a variety of comments with her family which indicate that she is nearing the end of life.  She has consistently shared that she "wants to be with her mother".  She shared that wound she wants to go "home".  She shared that she "needs to give everyone their Christmas presents as she will not be here this year for Christmas".  She is also shared with her nephew that she does not think she will be around during the holiday season next year.  In the past 3 weeks it appears Carolyn Valenzuela's decline has been the most marked whereby she has remained to be encephalopathic fairly consistently in conjunction with her failure to thrive.  Per her Carolyn Valenzuela who had spoken with the dialysis center physicians it was identified that she is no longer benefiting from dialysis as they are not able to complete full courses of treatment due to patient's intolerance.  Carolyn Valenzuela and Carolyn Look like agree that Carolyn Valenzuela is by no measure thriving at this point in time and if anything is declaring that her life will be short.  A conversation was held in regards to whether or not we continue the present modalities of care or if we transition her focus to comfort oriented care.  We talked about transition to comfort measures in house and what that would entail inclusive of medications to control pain, dyspnea, agitation, nausea, itching, and hiccups.   We discussed stopping all uneccessary measures such as telemetry monitoring, supplemental oxygen,  antibiotics, blood draws, needle sticks, and frequent vital signs.    Patient's sisters both expressed that Carolyn Valenzuela is now at a point where they believe she would no longer want invasive medical treatment.  They are of the mindset that we should now stop dialysis and alleviate any suffering that she may be experiencing.  A conversation regarding where end-of-life care should take place and after going back and forth amongst family members it was decided that Carolyn Valenzuela being in an inpatient hospice home would be the best next step.  In the meanwhile patient's family consents to initiating low-dose medications to aid in relief of any troubling symptoms.  Patient's primary medical team and nephrology team informed of this change.  Patient's nurse informed of this change in care.  Comfort orders have been placed.   Objective Assessment: Vital Signs Vitals:   08/22/21 1100 08/22/21 1130  BP: (!) 111/50 (!) 104/49  Pulse: 65 67  Resp: 14 16  Temp:    SpO2:      Intake/Output Summary (Last 24 hours) at 08/22/2021 1201 Last data filed at 08/21/2021 2206 Gross per 24 hour  Intake 940 ml  Output --  Net 940 ml   Last Weight  Most recent update: 08/22/2021  7:34 AM    Weight  52.9 kg (116 lb 10 oz)            Gen: Elderly female appears older than stated age in no acute distress HEENT: moist mucous membranes CV: Regular rate and rhythm PULM: On room air this morning ABD: soft/nontender EXT: Bilateral BKA's Neuro: Alert and oriented x1   SUMMARY OF RECOMMENDATIONS   DNAR/DNI   Transition focus to comfort oriented care  Comfort medications per MAR-we will add low-dose Dilaudid and Ativan every 8 hours to provide symptomatic relief  Additional comfort medications per [facility]  Unrestricted visitation  TOC - Referral to [facility] inpatient hospice  Ongoing palliative support  Time Spent: 78 minutes Greater than 50% of the time was spent in counseling and coordination of  care ______________________________________________________________________________________ [place] Team Team Cell Phone: [phone] Please utilize secure chat with additional questions, if there is no response within 30 minutes please call the above phone number  Palliative Medicine Team providers are available by phone from 7am to 7pm daily and can be reached through the team cell phone.  Should this patient require assistance outside of these hours, please call the patient's attending physician.

## 2021-08-22 NOTE — TOC Transition Note (Signed)
Transition of Care [facility]) - CM/SW Discharge Note   Patient Details  Name: Carolyn Valenzuela MRN: [id number] Date of Birth: 1951/05/29  Transition of Care [facility]) CM/SW Contact:  Bethann Berkshire, [place] Phone Number: 08/22/2021, 3:58 PM   Clinical Narrative:    Bed available at [facility] today. Pt sister [name] to complete paperwork.   Patient will DC to: [facility] Anticipated DC date: 08/22/21 Family notified: Sister [name] Transport by: [name]   Per MD patient ready for DC to [facility]. RN, patient, patient's family, and facility notified of DC. Discharge Summary and FL2 sent to facility. RN to call report prior to discharge [phone]). DC packet on chart. Ambulance transport requested for patient.   CSW will sign off for now as social work intervention is no longer needed. Please consult [place] again if new needs arise.    Final next level of care: [place] Barriers to Discharge: No Barriers Identified   Patient Goals and CMS Choice Patient states their goals for this hospitalization and ongoing recovery are:: Patient states she lives with her niece.      Discharge Placement              Patient chooses bed at:  [facility]) Patient to be transferred to facility by: [place] Name of family member notified: Sister [name] Patient and family notified of of transfer: 08/22/21  Discharge Plan and Services In-house Referral: NA Discharge Planning Services: CM Consult Post Acute Care Choice: Home Health, Resumption of Svcs/PTA Provider          DME Arranged: N/A DME Agency: NA       HH Arranged: RN, Disease Management, PT, OT, Nurse's Aide, Social Work [employer] Agency: [place] Date HH Agency Contacted: 08/20/21 Time [place]: 8110 Representative spoke with at [place]: [place] ([place]) Interventions     Readmission Risk Interventions No flowsheet data found.

## 2021-08-22 NOTE — Progress Notes (Addendum)
Report called to [facility], hospice RN. All questions answered at this time. Per Hospice RN, PIV to remain in patient's wrist on discharge.   Patient to be transferred via PTAR this evening.   Written discharge instructions to be sent with patient upon transfer, verbal discharge instructions given to family earlier this afternoon.   Patient belongings to be transferred with patient via PTAR: Jacket, bag with Medications, dentures, and 2 prothesis legs with foam molds.

## 2021-08-22 NOTE — Progress Notes (Signed)
Informed that patient is transitioning to comfort care, will no longer be doing HD. Will sign off from a nephrology perspective. Please call with any questions/concerns.  Gean Quint, MD [facility]

## 2021-08-22 NOTE — Progress Notes (Signed)
[facility] ([facility]) Hospital Liaison note.     This patient is approved to transfer to [facility] today.    [facility] will notify TOC when registration paperwork has been completed to arrange transport.    RN please call report to [phone].   Thank you,     Mary Anne Robertson, RN, CCM       [facility] Hospital Liaison  [phone] 

## 2021-08-22 NOTE — Progress Notes (Signed)
[facility] Progress Note   Subjective:    Seen and examined on HD. Tolerating treatment, no complaints.  Objective Vitals:   08/22/21 0353 08/22/21 0733 08/22/21 0814 08/22/21 0830  BP: (!) 126/57 130/60 135/61 (!) 134/56  Pulse: 69 63 63 62  Resp: 13 13  16   Temp: 98 F (36.7 C) (!) 97.5 F (36.4 C)    TempSrc: Oral Oral    SpO2: 95% 100%    Weight:  52.9 kg    Height:       Physical Exam General: Older female; ill-appearing; on O2; NAD Heart: S1 and S2; No murmurs, gallops, or rubs Lungs: Clear anteriorly and laterally; No wheezing, rales, or rhonchi Abdomen: Soft, non-tender, active bowel sounds Extremities:BL BKAs; No edema upper thighs Dialysis Access: LUE AVF (+) Bruit/Thrill   Filed Weights   08/20/21 0945 08/20/21 1340 08/22/21 0733  Weight: 52.1 kg 50.8 kg 52.9 kg    Intake/Output Summary (Last 24 hours) at 08/22/2021 0858 Last data filed at 08/21/2021 2206 Gross per 24 hour  Intake 940 ml  Output --  Net 940 ml    Additional Objective Labs: Basic Metabolic Panel: Recent Labs  Lab 08/20/21 0126 08/21/21 0404 08/22/21 0128  NA 131* 129* 133*  K 3.6 4.1 3.5  CL 100 98 99  CO2 19* 24 29  GLUCOSE 147* 100* 245*  BUN 12 8 19   CREATININE 2.56* 2.26* 2.93*  CALCIUM 7.3* 7.4* 7.9*  PHOS  --  1.4* 1.5*   Liver Function Tests: Recent Labs  Lab 08/19/21 1258 08/20/21 0126 08/21/21 0404 08/22/21 0128  AST 24 16  --   --   ALT 15 12  --   --   ALKPHOS 92 57  --   --   BILITOT 1.8* 0.8  --   --   PROT 6.1* 3.7*  --   --   ALBUMIN 2.2* <1.5* 1.8* 1.5*   No results for input(s): LIPASE, AMYLASE in the last 168 hours. CBC: Recent Labs  Lab 08/19/21 1258 08/20/21 0126 08/20/21 1521 08/22/21 0128  WBC 17.8* 9.3  --  10.5  NEUTROABS 14.7* 7.3  --   --   HGB 11.2* 7.8* 9.6* 8.9*  HCT 33.5* 22.2* 27.0* 25.9*  MCV 91.0 89.2  --  86.3  PLT 173 117*  --  156   Blood Culture    Component Value Date/Time   SDES BLOOD RIGHT  HAND 08/20/2021 0125   SPECREQUEST  08/20/2021 0125    AEROBIC BOTTLE ONLY Blood Culture results may not be optimal due to an inadequate volume of blood received in culture bottles   CULT  08/20/2021 0125    NO GROWTH 2 DAYS Performed at [place] [address], [place], [place] [address]    REPTSTATUS PENDING 08/20/2021 0125    Cardiac Enzymes: No results for input(s): CKTOTAL, CKMB, CKMBINDEX, TROPONINI in the last 168 hours. CBG: Recent Labs  Lab 08/21/21 0739 08/21/21 1135 08/21/21 1612 08/21/21 2157 08/22/21 0649  GLUCAP 111* 113* 132* 251* 199*   Iron Studies: No results for input(s): IRON, TIBC, TRANSFERRIN, FERRITIN in the last 72 hours. Lab Results  Component Value Date   INR 2.1 (H) 08/20/2021   INR 1.06 06/30/2016   INR 1.22 12/11/2015   Studies/Results: No results found.  Medications:  sodium chloride     sodium chloride     cefTRIAXone (ROCEPHIN)  IV Stopped (08/21/21 1125)    (feeding supplement) PROSource Plus  30  mL Oral TID BM   aspirin EC  81 mg Oral Daily   azithromycin  500 mg Oral Daily   brimonidine  1 drop Both Eyes TID   cinacalcet  30 mg Oral Q M,W,F-1800   clopidogrel  75 mg Oral Daily   enoxaparin (LOVENOX) injection  30 mg Subcutaneous Q24H   feeding supplement (NEPRO CARB STEADY)  237 mL Oral BID BM   gabapentin  200 mg Oral TID   insulin aspart  0-6 Units Subcutaneous TID WC   pantoprazole  40 mg Oral Daily   rosuvastatin  10 mg Oral Daily    Dialysis Orders: Center: [facility]  on MWF. 160NRe, 4 hours, BFR 400, DFR 500, EDW 51 kg, 3K, 2Ca, AVF 15g, heparin 2500 unit bolus UF profile 2 Mircera 75 mcg IV q 2 weeks- last dose 08/18/21 Hectorol 34mcg IV q HD Sensipar 30mg  2 times per week Velphoro 500mg  2 tabs PO TID with meals   Assessment/Plan: Pneumonia with sepsis -  on ceftriaxone and azyithromycin.  WBC and lactate down-trending. Managed by primary. ESRD -  Will continue with HD on MWF schedule.  She is below her  edw-will need to lower at discharge. FTT - pt losing weight with anorexia, worsening cognitive, nutritional, and functional status. Palliative care consulted. Family meeting planned for 12/23. DNR. Hypertension/volume  - Bps stable and euvolemic on exam. UF as tolerated Anemia  - significant drop in Hgb at admit. Hgb down to 8.9. Just received ESA so will need to transfuse if Hgb continues to fall <7.  Will need to r/o GI bleed-defer to primary Metabolic bone disease -  CorrCa ok, PO4 low-poor nutrition. Currently not on binders. Continue with home meds. Phos low today, can liberalize diet Nutrition - severe protein malnutrition. Push protein-on supplements Disposition - will need SNF placement. Palliative care consulted-reviewed note: family meeting scheduled 12/23 for ongoing discussion on patient's current medical condition.  Gean Quint, MD [facility]

## 2021-08-22 NOTE — Progress Notes (Signed)
PROGRESS NOTE  Carolyn Valenzuela  EHU:[id number] DOB: 18-Sep-1950 DOA: 08/19/2021 PCP: Biagio Borg, MD   Brief Narrative: Carolyn Valenzuela is a 70 y.o. female with medical history significant of seasonal allergies, iron deficiency anemia, arctic regurgitation, CAD, left cervical radiculopathy, history of critical lower limb ischemia, bilateral BKA, uncontrolled type II DM, esophagitis, ESRD on HD, history of HCAP, hyperlipidemia, hypertension, ischemic cardiomyopathy, mitral regurgitation, peripheral vascular disease, secondary hyperparathyroidism who is sent by her PCP for evaluation and admission to a nursing home.  She also has a wound on her left stump.  She has had decreased appetite and weight loss.  Her generalized weakness has become worse.  She has been home for a few weeks after being discharged to [facility] rehab where she spent about 20 days after her last admission in October.   She was afebrile with leukocytosis of 17.8k, lactic acidosis (lactate 3.2), with CXR revealing new bilateral upper lobe pneumonia with increased small bilateral pleural effusion. IV antibiotics were given and the patient was admitted  Assessment & Plan: Principal Problem:   Sepsis due to pneumonia [facility]) Active Problems:   Type 2 diabetes mellitus with diabetic chronic kidney disease ([place])   Hyperlipidemia   Essential hypertension   ESRD on dialysis ([place])   GERD (gastroesophageal reflux disease)   Severe protein-calorie malnutrition ([place])   Anemia in ESRD (end-stage renal disease) ([place])   Hyperbilirubinemia   Pressure injury of skin  Bilateral pneumonia: Covid, flu negative. Sepsis technically is ruled out based on SIRS criteria.  - Completed Tx with ceftriaxone, azithromycin. WBC normalized. Lactate elevation improving. Cultures NGTD.   HTN: Hold antihypertensives currently due to soft BP.  ESRD:  - Nephrology consulted for routine HD. Will step away now that we're on comfort measures path.     Anemia of ESRD: Recently received ESA. Drop in hgb since admission in setting of large volume IVF resuscitation and drop in all cell lines has stabilized. No gross bleeding noted.  - To avoid iatrogenic anemia, will limit blood draws going forward.  T2DM with peripheral neuropathy: HbA1c 5.4% (not on meds at home) - Continue SSI, gabapentin. At inpatient goal   Failure to thrive: Note 20 lbs weight loss and still has swelling in LE's, so suspect muscle mass loss. Debility has progressed to limiting her ability to remain safely at home despite 24 hour assistance with family.  - Palliative care consulted. Family meeting on 12/23, will transition to comfort measures only, wants to stop dialysis. Residential hospice placement is sought, no beds today. Continue only medications for pain, nausea, anxiety, agitation, etc.    Severe protein-calorie malnutrition:  - Supplement protein as able. Albumin undetectably low (<1.5).   Stage 2 pressure injury to right BKA stump, POA: Offload as able.  DVT prophylaxis: None Code Status: DNR Family Communication: Palliative met with family today. Disposition Plan:  Status is: Inpatient, awaiting residential hospice placement. D/w CSW, no beds today, will check back tomorrow.  Consultants:  Nephrology Palliative care  Procedures:  HD (routine) 12/21, 12/23  Antimicrobials: Ceftriaxone, azithromycin   Subjective: Seen at dialysis. Feels tired/wiped out but without new complaints. Does not report leg pain.   Objective: Vitals:   08/22/21 1030 08/22/21 1100 08/22/21 1130 08/22/21 1200  BP: (!) 119/53 (!) 111/50 (!) 104/49 (!) 109/54  Pulse: 65 65 67 68  Resp: _0 Temp:    (!) 97.2 F (36.2 C)  TempSrc:    Oral  SpO2:  100%  Weight:    51 kg  Height:        Intake/Output Summary (Last 24 hours) at 08/22/2021 1349 Last data filed at 08/22/2021 1147 Gross per 24 hour  Intake 940 ml  Output 1900 ml  Net -960 ml   Filed  Weights   08/20/21 1340 08/22/21 0733 08/22/21 1200  Weight: 50.8 kg 52.9 kg 51 kg   Gen: Chronically ill-appearing and frail female in no distress Pulm: Nonlabored breathing. Clear. CV: Regular rate and rhythm. Stable soft murmur without rub, or gallop. No JVD, woody pitting edema is stable. GI: Abdomen soft, non-tender, non-distended, with normoactive bowel sounds.  Ext: Warm, bilateral BKAs without infection at stump sites. Right BKA has shallow wound that is dressed, c/d/i Skin: No other rashes, lesions or ulcers on visualized skin. Neuro: Alert and interactive without focal neurological deficits. Psych: Judgement and insight appear marginal. Mood euthymic & affect congruent. Behavior is appropriate.    Data Reviewed: I have personally reviewed following labs and imaging studies  CBC: Recent Labs  Lab 08/19/21 1258 08/20/21 0126 08/20/21 1521 08/22/21 0128  WBC 17.8* 9.3  --  10.5  NEUTROABS 14.7* 7.3  --   --   HGB 11.2* 7.8* 9.6* 8.9*  HCT 33.5* 22.2* 27.0* 25.9*  MCV 91.0 89.2  --  86.3  PLT 173 117*  --  025   Basic Metabolic Panel: Recent Labs  Lab 08/19/21 1258 08/20/21 0126 08/21/21 0404 08/22/21 0128  NA 130* 131* 129* 133*  K 3.6 3.6 4.1 3.5  CL 95* 100 98 99  CO2 24 19* 24 29  GLUCOSE 299* 147* 100* 245*  BUN _0 CREATININE 3.12* 2.56* 2.26* 2.93*  CALCIUM 8.1* 7.3* 7.4* 7.9*  PHOS  --   --  1.4* 1.5*   GFR: Estimated Creatinine Clearance: 13 mL/min (A) (by C-G formula based on SCr of 2.93 mg/dL (H)). Liver Function Tests: Recent Labs  Lab 08/19/21 1258 08/20/21 0126 08/21/21 0404 08/22/21 0128  AST 24 16  --   --   ALT 15 12  --   --   ALKPHOS 92 57  --   --   BILITOT 1.8* 0.8  --   --   PROT 6.1* 3.7*  --   --   ALBUMIN 2.2* <1.5* 1.8* 1.5*   No results for input(s): LIPASE, AMYLASE in the last 168 hours. Recent Labs  Lab 08/19/21 1259  AMMONIA 25   Coagulation Profile: Recent Labs  Lab 08/20/21 0126  INR 2.1*   Cardiac  Enzymes: No results for input(s): CKTOTAL, CKMB, CKMBINDEX, TROPONINI in the last 168 hours. BNP (last 3 results) No results for input(s): PROBNP in the last 8760 hours. HbA1C: No results for input(s): HGBA1C in the last 72 hours. CBG: Recent Labs  Lab 08/21/21 0739 08/21/21 1135 08/21/21 1612 08/21/21 2157 08/22/21 0649  GLUCAP 111* 113* 132* 251* 199*   Lipid Profile: No results for input(s): CHOL, HDL, LDLCALC, TRIG, CHOLHDL, LDLDIRECT in the last 72 hours. Thyroid Function Tests: No results for input(s): TSH, T4TOTAL, FREET4, T3FREE, THYROIDAB in the last 72 hours. Anemia Panel: No results for input(s): VITAMINB12, FOLATE, FERRITIN, TIBC, IRON, RETICCTPCT in the last 72 hours. Urine analysis:    Component Value Date/Time   COLORURINE YELLOW 09/15/2016 1300   APPEARANCEUR CLEAR 09/15/2016 1300   LABSPEC 1.010 09/15/2016 1300   PHURINE 7.5 09/15/2016 1300   GLUCOSEU 250 (A) 09/15/2016 1300   HGBUR SMALL (A) 09/15/2016 1300  BILIRUBINUR NEGATIVE 09/15/2016 1300   KETONESUR NEGATIVE 09/15/2016 1300   PROTEINUR >300 (A) 03/19/2016 0206   UROBILINOGEN 0.2 09/15/2016 1300   NITRITE NEGATIVE 09/15/2016 1300   LEUKOCYTESUR NEGATIVE 09/15/2016 1300   Recent Results (from the past 240 hour(s))  Resp Panel by RT-PCR (Flu A&B, Covid) Nasopharyngeal Swab     Status: None   Collection Time: 08/19/21  2:00 PM   Specimen: Nasopharyngeal Swab; Nasopharyngeal(NP) swabs in vial transport medium  Result Value Ref Range Status   SARS Coronavirus 2 by RT PCR NEGATIVE NEGATIVE Final    Comment: (NOTE) SARS-CoV-2 target nucleic acids are NOT DETECTED.  The SARS-CoV-2 RNA is generally detectable in upper respiratory specimens during the acute phase of infection. The lowest concentration of SARS-CoV-2 viral copies this assay can detect is 138 copies/mL. A negative result does not preclude SARS-Cov-2 infection and should not be used as the sole basis for treatment or other patient  management decisions. A negative result may occur with  improper specimen collection/handling, submission of specimen other than nasopharyngeal swab, presence of viral mutation(s) within the areas targeted by this assay, and inadequate number of viral copies(<138 copies/mL). A negative result must be combined with clinical observations, patient history, and epidemiological information. The expected result is Negative.  Fact Sheet for Patients:  EntrepreneurPulse.com.au  Fact Sheet for Healthcare Providers:  IncredibleEmployment.be  This test is no t yet approved or cleared by the [place] FDA and  has been authorized for detection and/or diagnosis of SARS-CoV-2 by FDA under an Emergency Use Authorization (EUA). This EUA will remain  in effect (meaning this test can be used) for the duration of the COVID-19 declaration under Section 564(b)(1) of the Act, 21 U.S.C.section 360bbb-3(b)(1), unless the authorization is terminated  or revoked sooner.       Influenza A by PCR NEGATIVE NEGATIVE Final   Influenza B by PCR NEGATIVE NEGATIVE Final    Comment: (NOTE) The Xpert Xpress SARS-CoV-2/FLU/RSV plus assay is intended as an aid in the diagnosis of influenza from Nasopharyngeal swab specimens and should not be used as a sole basis for treatment. Nasal washings and aspirates are unacceptable for Xpert Xpress SARS-CoV-2/FLU/RSV testing.  Fact Sheet for Patients: EntrepreneurPulse.com.au  Fact Sheet for Healthcare Providers: IncredibleEmployment.be  This test is not yet approved or cleared by the [place] FDA and has been authorized for detection and/or diagnosis of SARS-CoV-2 by FDA under an Emergency Use Authorization (EUA). This EUA will remain in effect (meaning this test can be used) for the duration of the COVID-19 declaration under Section 564(b)(1) of the Act, 21 U.S.C. section 360bbb-3(b)(1),  unless the authorization is terminated or revoked.  Performed at [facility], [place] [address] [place], [place] [address]   Blood culture (routine x 2)     Status: None (Preliminary result)   Collection Time: 08/19/21  2:47 PM   Specimen: BLOOD  Result Value Ref Range Status   Specimen Description   Final    BLOOD BLOOD RIGHT WRIST Performed at [place] [address] [place], [place] [address]    Special Requests   Final    BOTTLES DRAWN AEROBIC AND ANAEROBIC Blood Culture results may not be optimal due to an inadequate volume of blood received in culture bottles Performed at [place] [address], [place], [place] [address]    Culture   Final    NO GROWTH 3 DAYS Performed at [facility], [place]  [address], [place], [place] [address]    Report Status PENDING  Incomplete  Blood culture (routine x 2)     Status: None (Preliminary result)   Collection Time: 08/20/21  1:25 AM   Specimen: BLOOD RIGHT HAND  Result Value Ref Range Status   Specimen Description BLOOD RIGHT HAND  Final   Special Requests   Final    AEROBIC BOTTLE ONLY Blood Culture results may not be optimal due to an inadequate volume of blood received in culture bottles   Culture   Final    NO GROWTH 2 DAYS Performed at [facility], [place] [address], [place], [place] [address]    Report Status PENDING  Incomplete      Radiology Studies: No results found.  Scheduled Meds:  brimonidine  1 drop Both Eyes TID    HYDROmorphone (DILAUDID) injection  0.5 mg Intravenous Q8H   LORazepam  1 mg Intravenous Q8H   Continuous Infusions:     LOS: 3 days   Time spent: 25 minutes.  Patrecia Pour, MD [facility] [email or web address] 08/22/2021, 1:49 PM

## 2021-08-22 NOTE — TOC Progression Note (Signed)
Transition of Care [facility]) - Progression Note    Patient Details  Name: Carolyn Valenzuela MRN: [id number] Date of Birth: [birth year]  Transition of Care [facility]) CM/SW [place], [place] Phone Number: 08/22/2021, 12:10 PM  Clinical Narrative:     CSW is notified by PMT that family is wanting Hospice at [facility]. CSW called pt sister Carolyn Valenzuela and confirmed that family wants [facility]. Updated sister that no bed available today. A referral was made to [facility]. No beds today and unsure of availability or ability to admit over the weekend. TOC will continue to follow through DC and for bed availability at [facility].   Expected Discharge Plan: [place] (SNF vs Home with Hospice?) Barriers to Discharge: Continued Medical Work up  Expected Discharge Plan and Services Expected Discharge Plan: [place] (SNF vs Home with Hospice?) In-house Referral: NA Discharge Planning Services: CM Consult Post Acute Care Choice: Home Health, Resumption of Svcs/PTA Provider Living arrangements for the past 2 months: Apartment                 DME Arranged: N/A DME Agency: NA       HH Arranged: RN, Disease Management, PT, OT, Nurse's Aide, Social Work [employer] Agency: [place] Date HH Agency Contacted: 08/20/21 Time [place]: 8768 Representative spoke with at [place]: [place] ([place]) Interventions    Readmission Risk Interventions No flowsheet data found.

## 2021-08-23 NOTE — Progress Notes (Signed)
Discharge  PTAR taking patient to [facility]. Given paperwork. PIV left in per order. Vitals stable. No complications.

## 2021-08-24 LAB — CULTURE, BLOOD (ROUTINE X 2): Culture: NO GROWTH

## 2021-08-25 LAB — CULTURE, BLOOD (ROUTINE X 2): Culture: NO GROWTH

## 2021-08-26 ENCOUNTER — Ambulatory Visit: Payer: Self-pay | Admitting: *Deleted

## 2021-08-26 DIAGNOSIS — N186 End stage renal disease: Secondary | ICD-10-CM

## 2021-08-26 DIAGNOSIS — I10 Essential (primary) hypertension: Secondary | ICD-10-CM

## 2021-08-26 DIAGNOSIS — E1122 Type 2 diabetes mellitus with diabetic chronic kidney disease: Secondary | ICD-10-CM

## 2021-08-26 DIAGNOSIS — R627 Adult failure to thrive: Secondary | ICD-10-CM

## 2021-08-26 NOTE — Chronic Care Management (AMB) (Signed)
Chronic Care Management   CCM RN Visit Note  08/26/2021 Name: Carolyn Valenzuela MRN: [id number] DOB: 05/30/1951  Subjective: Carolyn Valenzuela is a 70 y.o. year old female who is a primary care patient of Biagio Borg, MD. The care management team was consulted for assistance with disease management and care coordination needs.    Noticed through review of EHR that patient has transitioned to inpatient hospice care at [facility]  for  care coordination with PCP to notify of same,  in response to provider referral for case management and/or care coordination services.   Consent to Services:  The patient was given information about Chronic Care Management services, agreed to services, and gave verbal consent prior to initiation of services.  Please see initial visit note for detailed documentation.  Patient agreed to services and verbal consent obtained.   Assessment: Review of patient past medical history, allergies, medications, health status, including review of consultants reports, laboratory and other test data, was performed as part of comprehensive evaluation and provision of chronic care management services.   CCM Care Plan Allergies  Allergen Reactions   Oxycodone Itching   Outpatient Encounter Medications as of 08/26/2021  Medication Sig Note   acetaminophen (TYLENOL) 500 MG tablet Take 1,000 mg by mouth every 6 (six) hours as needed for mild pain.    albuterol (VENTOLIN HFA) 108 (90 Base) MCG/ACT inhaler Inhale 2 puffs into the lungs every 6 (six) hours as needed for wheezing or shortness of breath.    ALPHAGAN P 0.1 % SOLN Place 1 drop into both eyes 3 (three) times daily.    gabapentin (NEURONTIN) 100 MG capsule TAKE 2 CAPSULES BY MOUTH 3 TIMES A DAY 08/19/2021: LF at [facility] on 05/01/21. 90 Day Supply    pantoprazole (PROTONIX) 40 MG tablet TAKE 1 TABLET BY MOUTH TWICE A DAY (Patient taking differently: Take 40 mg by mouth daily.) 08/19/2021: LF at [facility] on 05/19/21. 90 Day Supply     RESTASIS 0.05 % ophthalmic emulsion Place 1 drop into both eyes 2 (two) times daily. 08/19/2021: LF at [facility] on 07/30/21. 90 Day Supply    No facility-administered encounter medications on file as of 08/26/2021.   Patient Active Problem List   Diagnosis Date Noted   FTT (failure to thrive) in adult 08/20/2021   Pressure injury of skin 08/20/2021   Sepsis due to pneumonia ([place]) 08/19/2021   Severe protein-calorie malnutrition ([place]) 08/19/2021   Anemia in ESRD (end-stage renal disease) ([place]) 08/19/2021   Hyperbilirubinemia 08/19/2021   Physical deconditioning 06/29/2021   Cholelithiases 06/26/2021   Choledocholithiasis 06/25/2021   Lump of right breast 02/16/2021   Gait disorder 02/16/2021   Finger pain 02/16/2021   Right shoulder pain 02/16/2021   Other disorders of plasma-protein metabolism, not elsewhere classified 01/13/2021   Encounter for screening for COVID-19 04/22/2020   Allergy, unspecified, initial encounter 03/28/2020   Anaphylactic shock, unspecified, initial encounter 03/28/2020   Chronic low back pain 03/12/2020   Closed displaced fracture of shaft of fifth metacarpal bone of left hand 04/25/2019   Left hip pain 11/18/2017   GERD (gastroesophageal reflux disease) 10/10/2017   Bloating 10/10/2017   PVD (peripheral vascular disease) ([place])    Phantom pain after amputation of lower extremity ([place]) 01/05/2017   Encounter for removal of sutures 12/01/2016   Abnormal CXR 07/07/2016   Pneumonia, unspecified organism 07/07/2016   Hypotension    NSTEMI (non-ST elevated myocardial infarction) ([place])    Encounter for immunization 05/27/2016  Nausea and vomiting 03/18/2016   Hypokalemia 01/31/2016   Sepsis ([place]) due to UTI 12/20/2015   Acute lower UTI    Labile blood pressure    SIRS (systemic inflammatory response syndrome) (HCC)    Hypoglycemia associated with diabetes ([place])    Urinary retention    Dysphagia    S/P bilateral BKA (below knee amputation) ([place])     Abnormality of gait    Muscle spasm    ESRD on dialysis ([place])    Type 2 diabetes mellitus with diabetic peripheral angiopathy and gangrene, without long-term current use of insulin (HCC)    Post-operative pain    Metabolic bone disease    Hypoxia    Type 2 diabetes mellitus with peripheral neuropathy (HCC)    Labile blood glucose    Postoperative pain    Acute blood loss anemia    Anemia of chronic disease    Below knee amputation status 11/29/2015   Odynophagia 11/12/2015   Unspecified disorder of calcium metabolism 07/31/2015   Chest pain, atypical 06/06/2015   Ingrown nail 05/28/2015   Other fluid overload 02/15/2015   Shortness of breath 12/13/2014   Diarrhea, unspecified 12/13/2014   Pain, unspecified 12/13/2014   Pruritus, unspecified 12/13/2014   Unspecified protein-calorie malnutrition ([place]) 10/17/2014   Acute pulmonary edema (HCC)    FUO (fever of unknown origin)    Complication of vascular dialysis catheter 09/10/2014   Fever, unspecified 09/10/2014   HCAP (healthcare-associated pneumonia)    Coagulation defect, unspecified ([place]) 09/06/2014   Radiculopathy, cervical region 09/06/2014   End stage renal disease ([place]) 09/06/2014   Essential (primary) hypertension 09/06/2014   Hyperlipidemia, unspecified 09/06/2014   Encounter for screening for respiratory tuberculosis 09/06/2014   Secondary hyperparathyroidism of renal origin ([place]) 09/06/2014   Anemia in chronic kidney disease 09/06/2014   Type 2 diabetes mellitus with ESRD (end-stage renal disease) ([place]) 09/01/2014   Anemia of renal disease 09/01/2014   Venous insufficiency of left leg 08/27/2014   Rash 07/06/2014   Diabetic wet gangrene of the foot - Right 03/15/2014   Critical lower limb ischemia ([place]) 01/09/2014   Right foot ulcer ([place]) 11/07/2013   Toe pain 09/06/2013   Pre-ulcerative corn or callous 09/06/2013   Lower extremity pain 09/06/2013   Low back pain 12/08/2011   Hip bursitis, left 12/08/2011    Left leg pain 12/08/2011   Abdominal pain 07/08/2011   Encounter for well adult exam with abnormal findings 12/08/2010   Allergic rhinitis, cause unspecified 12/08/2010   Type 2 diabetes mellitus with diabetic chronic kidney disease ([place]) 05/21/2009   SECONDARY HYPERPARATHYROIDISM 05/21/2009   NUMBNESS 05/21/2009   BACK PAIN 05/02/2009   ANEMIA-IRON DEFICIENCY 01/25/2008   Essential hypertension 01/25/2008   Brachial neuritis or radiculitis 01/25/2008   Hyperlipidemia 03/30/2007   Conditions to be addressed/monitored:  HTN, DMII, and ESRD  Care Plan : RN Care Manager Plan of Care  Updates made by Knox Royalty, RN since 08/26/2021 12:00 AM     Problem: Chronic Disease Management Needs   Priority: Medium     Long-Range Goal: Development of plan of care for long term chronic disease management   Start Date: 05/01/2021  Expected End Date: 05/01/2022  Priority: Medium  Note:   Current Barriers:  Chronic Disease Management support and education needs related to HTN, DMII, and ESRD Bilateral BKA- has bilateral prosthesis: uses walker- denies recent falls ESRD- on hemodialysis: M-W-F; adherent to HD schedule; uses bus ([facility]) for transportation to HD Recent hospitalization October  27-July 01, 2021 for cholelithiasis- discharged to SNF/ rehabilitation center- discharged from SNF/ rehab to home on 07/27/21-- home health services active Hospitalization December 20-23, 2022: discharged 08/22/21 to inpatient hospice care at [facility]):  Patient will demonstrate ongoing health management independence for DMII; ESRD- on HD M-W-F; HTN  through collaboration with RN Care manager, provider, and care team.   Interventions: 1:1 collaboration with primary care provider regarding development and update of comprehensive plan of care as evidenced by provider attestation and co-signature Inter-disciplinary care team collaboration (see longitudinal plan of care) Evaluation  of current treatment plan related to  self management and patient's adherence to plan as established by provider  08/26/21: noticed through review of EHR that patient transferred to inpatient hospice care at [facility] on 08/22/21; case closed accordingly    Plan: No further follow up required: patient currently residing at inpatient Hospice facility ([facility])  Oneta Rack, RN, BSN, [place] [phone]: direct office

## 2021-08-30 DIAGNOSIS — E1129 Type 2 diabetes mellitus with other diabetic kidney complication: Secondary | ICD-10-CM | POA: Diagnosis not present

## 2021-08-30 DIAGNOSIS — Z992 Dependence on renal dialysis: Secondary | ICD-10-CM | POA: Diagnosis not present

## 2021-08-30 DIAGNOSIS — I10 Essential (primary) hypertension: Secondary | ICD-10-CM | POA: Diagnosis not present

## 2021-08-30 DIAGNOSIS — N186 End stage renal disease: Secondary | ICD-10-CM | POA: Diagnosis not present

## 2021-08-30 DIAGNOSIS — E1122 Type 2 diabetes mellitus with diabetic chronic kidney disease: Secondary | ICD-10-CM | POA: Diagnosis not present

## 2021-09-06 ENCOUNTER — Other Ambulatory Visit: Payer: Self-pay | Admitting: Internal Medicine

## 2021-09-08 ENCOUNTER — Encounter: Payer: Self-pay | Admitting: Internal Medicine

## 2021-10-01 DEATH — deceased
